# Patient Record
Sex: Female | Born: 1950
Health system: Southern US, Community
[De-identification: ages and names within clinical notes are randomized; demographics above are authoritative.]

## PROBLEM LIST (undated history)

## (undated) DIAGNOSIS — Z8669 Personal history of other diseases of the nervous system and sense organs: Secondary | ICD-10-CM

## (undated) DIAGNOSIS — E7800 Pure hypercholesterolemia, unspecified: Secondary | ICD-10-CM

## (undated) DIAGNOSIS — I10 Essential (primary) hypertension: Secondary | ICD-10-CM

## (undated) DIAGNOSIS — G4733 Obstructive sleep apnea (adult) (pediatric): Secondary | ICD-10-CM

## (undated) DIAGNOSIS — E039 Hypothyroidism, unspecified: Secondary | ICD-10-CM

## (undated) DIAGNOSIS — C37 Malignant neoplasm of thymus: Secondary | ICD-10-CM

## (undated) DIAGNOSIS — K589 Irritable bowel syndrome without diarrhea: Secondary | ICD-10-CM

## (undated) DIAGNOSIS — G7000 Myasthenia gravis without (acute) exacerbation: Secondary | ICD-10-CM

## (undated) DIAGNOSIS — K219 Gastro-esophageal reflux disease without esophagitis: Secondary | ICD-10-CM

## (undated) DIAGNOSIS — Z9989 Dependence on other enabling machines and devices: Principal | ICD-10-CM

## (undated) DIAGNOSIS — T8859XA Other complications of anesthesia, initial encounter: Secondary | ICD-10-CM

## (undated) HISTORY — DX: Dependence on other enabling machines and devices: Z99.89

## (undated) HISTORY — PX: TONSILLECTOMY: SUR1361

## (undated) HISTORY — DX: Obstructive sleep apnea (adult) (pediatric): G47.33

## (undated) HISTORY — DX: Personal history of other diseases of the nervous system and sense organs: Z86.69

## (undated) HISTORY — DX: Irritable bowel syndrome, unspecified: K58.9

## (undated) HISTORY — DX: Gastro-esophageal reflux disease without esophagitis: K21.9

## (undated) HISTORY — DX: Myasthenia gravis without (acute) exacerbation: G70.00

## (undated) HISTORY — DX: Essential (primary) hypertension: I10

## (undated) HISTORY — PX: ABDOMINAL HYSTERECTOMY: SHX81

## (undated) HISTORY — DX: Pure hypercholesterolemia, unspecified: E78.00

---

## 1997-02-17 HISTORY — PX: APPENDECTOMY: SHX54

## 2004-01-03 ENCOUNTER — Ambulatory Visit: Payer: Self-pay | Admitting: Internal Medicine

## 2005-02-11 ENCOUNTER — Ambulatory Visit: Payer: Self-pay | Admitting: Internal Medicine

## 2006-02-24 ENCOUNTER — Ambulatory Visit: Payer: Self-pay | Admitting: Internal Medicine

## 2007-05-06 ENCOUNTER — Ambulatory Visit: Payer: Self-pay | Admitting: Internal Medicine

## 2007-09-07 ENCOUNTER — Ambulatory Visit: Payer: Self-pay | Admitting: Gastroenterology

## 2007-11-12 ENCOUNTER — Ambulatory Visit: Payer: Self-pay | Admitting: Gastroenterology

## 2009-02-17 DIAGNOSIS — G7000 Myasthenia gravis without (acute) exacerbation: Secondary | ICD-10-CM

## 2009-02-17 HISTORY — DX: Myasthenia gravis without (acute) exacerbation: G70.00

## 2010-01-04 ENCOUNTER — Emergency Department: Payer: Self-pay | Admitting: Emergency Medicine

## 2010-01-24 ENCOUNTER — Ambulatory Visit: Payer: Self-pay | Admitting: Internal Medicine

## 2010-01-31 ENCOUNTER — Inpatient Hospital Stay: Payer: Self-pay | Admitting: Internal Medicine

## 2010-03-08 ENCOUNTER — Observation Stay: Payer: Self-pay | Admitting: Neurology

## 2010-03-15 ENCOUNTER — Observation Stay: Payer: Self-pay | Admitting: Neurology

## 2010-03-22 ENCOUNTER — Observation Stay: Payer: Self-pay | Admitting: Neurology

## 2010-03-29 ENCOUNTER — Observation Stay: Payer: Self-pay | Admitting: Neurology

## 2010-04-05 ENCOUNTER — Observation Stay: Payer: Self-pay | Admitting: Neurology

## 2010-04-19 ENCOUNTER — Observation Stay: Payer: Self-pay | Admitting: Internal Medicine

## 2010-11-28 ENCOUNTER — Emergency Department: Payer: Self-pay | Admitting: *Deleted

## 2011-02-07 ENCOUNTER — Ambulatory Visit: Payer: Self-pay | Admitting: Internal Medicine

## 2011-02-18 HISTORY — PX: CHOLECYSTECTOMY: SHX55

## 2011-03-18 DIAGNOSIS — R1013 Epigastric pain: Secondary | ICD-10-CM | POA: Diagnosis not present

## 2011-03-25 DIAGNOSIS — R350 Frequency of micturition: Secondary | ICD-10-CM | POA: Diagnosis not present

## 2011-03-25 DIAGNOSIS — R1013 Epigastric pain: Secondary | ICD-10-CM | POA: Diagnosis not present

## 2011-03-28 DIAGNOSIS — G7000 Myasthenia gravis without (acute) exacerbation: Secondary | ICD-10-CM | POA: Diagnosis not present

## 2011-03-31 DIAGNOSIS — I10 Essential (primary) hypertension: Secondary | ICD-10-CM | POA: Diagnosis not present

## 2011-04-14 DIAGNOSIS — K8010 Calculus of gallbladder with chronic cholecystitis without obstruction: Secondary | ICD-10-CM | POA: Diagnosis not present

## 2011-04-24 DIAGNOSIS — F848 Other pervasive developmental disorders: Secondary | ICD-10-CM | POA: Diagnosis not present

## 2011-04-24 DIAGNOSIS — F39 Unspecified mood [affective] disorder: Secondary | ICD-10-CM | POA: Diagnosis not present

## 2011-04-25 ENCOUNTER — Ambulatory Visit: Payer: Self-pay | Admitting: Surgery

## 2011-04-25 DIAGNOSIS — I10 Essential (primary) hypertension: Secondary | ICD-10-CM | POA: Diagnosis not present

## 2011-04-25 DIAGNOSIS — Z79899 Other long term (current) drug therapy: Secondary | ICD-10-CM | POA: Diagnosis not present

## 2011-04-25 DIAGNOSIS — Z01812 Encounter for preprocedural laboratory examination: Secondary | ICD-10-CM | POA: Diagnosis not present

## 2011-04-25 DIAGNOSIS — E7800 Pure hypercholesterolemia, unspecified: Secondary | ICD-10-CM | POA: Diagnosis not present

## 2011-04-25 DIAGNOSIS — K8010 Calculus of gallbladder with chronic cholecystitis without obstruction: Secondary | ICD-10-CM | POA: Diagnosis not present

## 2011-04-25 LAB — COMPREHENSIVE METABOLIC PANEL
Albumin: 3.4 g/dL (ref 3.4–5.0)
Anion Gap: 12 (ref 7–16)
BUN: 13 mg/dL (ref 7–18)
Bilirubin,Total: 0.4 mg/dL (ref 0.2–1.0)
Chloride: 107 mmol/L (ref 98–107)
Co2: 27 mmol/L (ref 21–32)
Creatinine: 1.11 mg/dL (ref 0.60–1.30)
EGFR (African American): >60
EGFR (Non-African Amer.): 53 — ABNORMAL LOW
Glucose: 110 mg/dL — ABNORMAL HIGH (ref 65–99)
Osmolality: 291 (ref 275–301)
Potassium: 3.1 mmol/L — ABNORMAL LOW (ref 3.5–5.1)
SGOT(AST): 18 U/L (ref 15–37)
SGPT (ALT): 25 U/L

## 2011-04-25 LAB — CBC WITH DIFFERENTIAL/PLATELET
Basophil #: 0.1 10*3/uL (ref 0.0–0.1)
Eosinophil #: 0.1 10*3/uL (ref 0.0–0.7)
Eosinophil %: 0.8 %
HCT: 39.9 % (ref 35.0–47.0)
Lymphocyte %: 20.4 %
MCH: 31.6 pg (ref 26.0–34.0)
MCHC: 32.5 g/dL (ref 32.0–36.0)
MCV: 98 fL (ref 80–100)
Monocyte #: 0.4 10*3/uL (ref 0.0–0.7)
RDW: 15.1 % — ABNORMAL HIGH (ref 11.5–14.5)

## 2011-04-25 LAB — URINALYSIS, COMPLETE
Blood: NEGATIVE
Nitrite: NEGATIVE
Ph: 6 (ref 4.5–8.0)
Protein: NEGATIVE
Specific Gravity: 1.019 (ref 1.003–1.030)
WBC UR: 193 /HPF (ref 0–5)

## 2011-04-27 LAB — URINE CULTURE

## 2011-05-02 ENCOUNTER — Ambulatory Visit: Payer: Self-pay | Admitting: Surgery

## 2011-05-02 DIAGNOSIS — R609 Edema, unspecified: Secondary | ICD-10-CM | POA: Diagnosis not present

## 2011-05-02 DIAGNOSIS — K8020 Calculus of gallbladder without cholecystitis without obstruction: Secondary | ICD-10-CM | POA: Diagnosis not present

## 2011-05-02 DIAGNOSIS — K8010 Calculus of gallbladder with chronic cholecystitis without obstruction: Secondary | ICD-10-CM | POA: Diagnosis not present

## 2011-05-02 DIAGNOSIS — K819 Cholecystitis, unspecified: Secondary | ICD-10-CM | POA: Diagnosis not present

## 2011-05-02 DIAGNOSIS — K219 Gastro-esophageal reflux disease without esophagitis: Secondary | ICD-10-CM | POA: Diagnosis not present

## 2011-05-02 DIAGNOSIS — Z79899 Other long term (current) drug therapy: Secondary | ICD-10-CM | POA: Diagnosis not present

## 2011-05-02 DIAGNOSIS — Z800 Family history of malignant neoplasm of digestive organs: Secondary | ICD-10-CM | POA: Diagnosis not present

## 2011-05-02 DIAGNOSIS — K589 Irritable bowel syndrome without diarrhea: Secondary | ICD-10-CM | POA: Diagnosis not present

## 2011-05-02 DIAGNOSIS — E7800 Pure hypercholesterolemia, unspecified: Secondary | ICD-10-CM | POA: Diagnosis not present

## 2011-05-02 DIAGNOSIS — G7000 Myasthenia gravis without (acute) exacerbation: Secondary | ICD-10-CM | POA: Diagnosis not present

## 2011-05-02 DIAGNOSIS — Z8249 Family history of ischemic heart disease and other diseases of the circulatory system: Secondary | ICD-10-CM | POA: Diagnosis not present

## 2011-05-02 DIAGNOSIS — Z833 Family history of diabetes mellitus: Secondary | ICD-10-CM | POA: Diagnosis not present

## 2011-05-02 DIAGNOSIS — I10 Essential (primary) hypertension: Secondary | ICD-10-CM | POA: Diagnosis not present

## 2011-05-05 LAB — PATHOLOGY REPORT

## 2011-07-21 DIAGNOSIS — F848 Other pervasive developmental disorders: Secondary | ICD-10-CM | POA: Diagnosis not present

## 2011-07-21 DIAGNOSIS — F39 Unspecified mood [affective] disorder: Secondary | ICD-10-CM | POA: Diagnosis not present

## 2011-10-10 DIAGNOSIS — G7000 Myasthenia gravis without (acute) exacerbation: Secondary | ICD-10-CM | POA: Diagnosis not present

## 2011-10-21 DIAGNOSIS — F39 Unspecified mood [affective] disorder: Secondary | ICD-10-CM | POA: Diagnosis not present

## 2011-12-05 ENCOUNTER — Encounter: Payer: Self-pay | Admitting: Internal Medicine

## 2011-12-05 ENCOUNTER — Ambulatory Visit (INDEPENDENT_AMBULATORY_CARE_PROVIDER_SITE_OTHER): Payer: Medicare Other | Admitting: Internal Medicine

## 2011-12-05 VITALS — BP 138/86 | HR 82 | Temp 99.0°F | Ht 66.0 in | Wt 219.0 lb

## 2011-12-05 DIAGNOSIS — E7800 Pure hypercholesterolemia, unspecified: Secondary | ICD-10-CM

## 2011-12-05 DIAGNOSIS — G40909 Epilepsy, unspecified, not intractable, without status epilepticus: Secondary | ICD-10-CM | POA: Diagnosis not present

## 2011-12-05 DIAGNOSIS — G7000 Myasthenia gravis without (acute) exacerbation: Secondary | ICD-10-CM

## 2011-12-05 DIAGNOSIS — I10 Essential (primary) hypertension: Secondary | ICD-10-CM | POA: Diagnosis not present

## 2011-12-05 DIAGNOSIS — K589 Irritable bowel syndrome without diarrhea: Secondary | ICD-10-CM

## 2011-12-05 NOTE — Patient Instructions (Addendum)
It was nice seeing you today.  I am glad you have been doing well.  We will schedule a follow up appt.

## 2011-12-21 ENCOUNTER — Encounter: Payer: Self-pay | Admitting: Internal Medicine

## 2011-12-21 DIAGNOSIS — E7800 Pure hypercholesterolemia, unspecified: Secondary | ICD-10-CM | POA: Insufficient documentation

## 2011-12-21 DIAGNOSIS — G7000 Myasthenia gravis without (acute) exacerbation: Secondary | ICD-10-CM | POA: Insufficient documentation

## 2011-12-21 DIAGNOSIS — G40909 Epilepsy, unspecified, not intractable, without status epilepticus: Secondary | ICD-10-CM | POA: Insufficient documentation

## 2011-12-21 DIAGNOSIS — K589 Irritable bowel syndrome without diarrhea: Secondary | ICD-10-CM | POA: Insufficient documentation

## 2011-12-21 DIAGNOSIS — I10 Essential (primary) hypertension: Secondary | ICD-10-CM | POA: Insufficient documentation

## 2011-12-21 NOTE — Progress Notes (Signed)
  Subjective:    Patient ID: Madison Burns, female    DOB: [birth year], 61 y.o.   MRN: [id number]  HPI 61 year old female with past history of myasthenia gravis, hypertension, hypercholesterolemia and seizure disorder who comes in today for a scheduled follow up.  She is accompanied by her sister (her caretaker).  History obtained from both of them.  Sister states Kriste Basque has been doing well.  Weight has stabilized.  Continuing to taper down her Prednisone.  Sees Dr Chestine Spore for her myasthenia gravis.  He is controlling the taper.  Sleeping well.  Sees Dr Janeece Riggers.  Risperdol helps her sleep.  No abdominal pain or cramping.  Eating and drinking well.  No bowel complaints.    Past Medical History  Diagnosis Date  . GERD (gastroesophageal reflux disease)   . Hypertension   . Hypercholesterolemia   . History of seizure disorder   . Myasthenia gravis   . Irritable bowel syndrome     Review of Systems Patient denies any headache, lightheadedness or dizziness.  No sinus or allergy symptoms.   No chest pain, tightness or palpitations.  No increased shortness of breath, cough or congestion.  No nausea or vomiting.  No abdominal pain or cramping.  No bowel change, such as diarrhea, constipation, BRBPR or melana.  No urine change.  Overall sister thinks she is doing well.      Objective:   Physical Exam Filed Vitals:   12/05/11 1543  BP: 138/86  Pulse: 82  Temp: 99 F (40.4 C)   60 year old female in no acute distress.   HEENT:  Nares - clear.  OP- without lesions or erythema.  NECK:  Supple, nontender.  No audible bruit.   HEART:  Appears to be regular. LUNGS:  Without crackles or wheezing audible.  Respirations even and unlabored.   RADIAL PULSE:  Equal bilaterally.  ABDOMEN:  Soft, nontender.  No audible abdominal bruit.   EXTREMITIES:  No increased edema to be present.  Stable.                   Assessment & Plan:  PSYCH.  Seeing Dr Janeece Riggers.  Doing well on her current regimen.   HYPERGLYCEMIA.   Blood sugars elevated with the long term prednisone therapy.  Sister reports with last labs, Dr Chestine Spore stated sugar ok.  Will obtain labs for review before drawing more labs here.  (sister request).    HEALTH MAINTENANCE.  Is s/p hysterectomy.  Obtain records for review.  Keep her on track with her mammograms and pelvic exams.  Follow cholesterol.  Sees GI.

## 2011-12-21 NOTE — Assessment & Plan Note (Signed)
Stable.  Continues to follow up with GI.   

## 2011-12-21 NOTE — Assessment & Plan Note (Signed)
On Keppra.  Doing well.  Continues to follow up with Dr Chestine Spore.

## 2011-12-21 NOTE — Assessment & Plan Note (Signed)
Sees Dr Chestine Spore.  Tapering prednisone.  Doing well.

## 2011-12-21 NOTE — Assessment & Plan Note (Signed)
On no cholesterol medication now.  Low cholesterol diet and exercise.  Review outside lab results before rechecking here.  Will need to follow lipid profile.

## 2011-12-21 NOTE — Assessment & Plan Note (Signed)
Has been on no medication.  Blood pressure had been doing better.  Follow.  Have them spot check readings.

## 2011-12-25 ENCOUNTER — Ambulatory Visit: Payer: Medicare Other

## 2012-01-14 ENCOUNTER — Other Ambulatory Visit: Payer: Self-pay | Admitting: Internal Medicine

## 2012-01-14 MED ORDER — TOLTERODINE TARTRATE ER 4 MG PO CP24: 4.0000 mg | ORAL_CAPSULE | Freq: Every morning | ORAL | Status: DC

## 2012-01-14 NOTE — Telephone Encounter (Signed)
Refill request for detrol la 4 mg cer #30 Sig: take 1 capsule every day need appt. Per MD

## 2012-01-14 NOTE — Telephone Encounter (Signed)
Refilled script for detrol la

## 2012-02-03 ENCOUNTER — Telehealth: Payer: Self-pay | Admitting: Internal Medicine

## 2012-02-03 ENCOUNTER — Other Ambulatory Visit: Payer: Self-pay | Admitting: *Deleted

## 2012-02-03 DIAGNOSIS — K589 Irritable bowel syndrome without diarrhea: Secondary | ICD-10-CM

## 2012-02-03 MED ORDER — PANTOPRAZOLE SODIUM 40 MG PO TBEC: 40.0000 mg | DELAYED_RELEASE_TABLET | Freq: Every morning | ORAL | Status: DC

## 2012-02-03 NOTE — Telephone Encounter (Signed)
Refilled script

## 2012-02-03 NOTE — Telephone Encounter (Signed)
Drug Name- Pantoprazole Sodium 40 mg   Directions- take 1 tablet by mouth daily   Quantity- 90

## 2012-02-21 ENCOUNTER — Emergency Department: Payer: Self-pay | Admitting: Emergency Medicine

## 2012-02-21 DIAGNOSIS — R51 Headache: Secondary | ICD-10-CM | POA: Diagnosis not present

## 2012-02-21 DIAGNOSIS — Z9889 Other specified postprocedural states: Secondary | ICD-10-CM | POA: Diagnosis not present

## 2012-02-21 DIAGNOSIS — Z79899 Other long term (current) drug therapy: Secondary | ICD-10-CM | POA: Diagnosis not present

## 2012-02-21 DIAGNOSIS — G7000 Myasthenia gravis without (acute) exacerbation: Secondary | ICD-10-CM | POA: Diagnosis not present

## 2012-02-21 DIAGNOSIS — I10 Essential (primary) hypertension: Secondary | ICD-10-CM | POA: Diagnosis not present

## 2012-02-21 LAB — BASIC METABOLIC PANEL
Calcium, Total: 9 mg/dL (ref 8.5–10.1)
Co2: 25 mmol/L (ref 21–32)
Creatinine: 1.14 mg/dL (ref 0.60–1.30)
EGFR (African American): >60
Osmolality: 275 (ref 275–301)
Potassium: 4.3 mmol/L (ref 3.5–5.1)

## 2012-02-21 LAB — CBC
HCT: 43.4 % (ref 35.0–47.0)
HGB: 14.6 g/dL (ref 12.0–16.0)
MCH: 32.7 pg (ref 26.0–34.0)
Platelet: 249 10*3/uL (ref 150–440)
RBC: 4.47 10*6/uL (ref 3.80–5.20)
RDW: 15.6 % — ABNORMAL HIGH (ref 11.5–14.5)
WBC: 15.3 10*3/uL — ABNORMAL HIGH (ref 3.6–11.0)

## 2012-02-23 DIAGNOSIS — B0232 Zoster iridocyclitis: Secondary | ICD-10-CM | POA: Diagnosis not present

## 2012-02-27 ENCOUNTER — Encounter: Payer: Self-pay | Admitting: Internal Medicine

## 2012-02-27 ENCOUNTER — Ambulatory Visit (INDEPENDENT_AMBULATORY_CARE_PROVIDER_SITE_OTHER): Payer: Medicare Other | Admitting: Internal Medicine

## 2012-02-27 VITALS — BP 108/58 | HR 108 | Temp 98.1°F | Ht 66.0 in | Wt 214.0 lb

## 2012-02-27 DIAGNOSIS — I10 Essential (primary) hypertension: Secondary | ICD-10-CM | POA: Diagnosis not present

## 2012-02-27 DIAGNOSIS — B0232 Zoster iridocyclitis: Secondary | ICD-10-CM | POA: Diagnosis not present

## 2012-02-27 MED ORDER — HYDROCODONE-ACETAMINOPHEN 5-500 MG PO TABS: 2.0000 | ORAL_TABLET | Freq: Four times a day (QID) | ORAL | Status: DC | PRN

## 2012-02-29 ENCOUNTER — Encounter: Payer: Self-pay | Admitting: Internal Medicine

## 2012-02-29 MED ORDER — PREGABALIN 50 MG PO CAPS: 50.0000 mg | ORAL_CAPSULE | Freq: Two times a day (BID) | ORAL | Status: DC

## 2012-02-29 NOTE — Assessment & Plan Note (Signed)
Blood pressure under good control.  Follow.   

## 2012-02-29 NOTE — Progress Notes (Signed)
  Subjective:    Patient ID: Madison Burns, female    DOB: 1950/09/18, 62 y.o.   MRN: [id number]  HPI 62 year old female with past history of hypercholesterolemia, myasthenia gravis, hypertension and seizure disorder who comes in today as a work in with concerns regarding shingles.  She is accompanied by her sister (her caretaker) and history obtained for both of them.  Her sister reports that she developed a headache approximately one week ago.  Went to ER a few days ago.  Had extensive w/up including scan, etc.  Had one lesion on her forehead at that time.  The next morning - noticed increased rash.  Called the ER back and was started on Valtrex and prednisone taper.  Was also given hydrocodone for pain.  She has been taking 2 tablets qid.  Saw opthalmology a few days ago and today.  On prednisolone eye drops.  No fever.  Some decreased appetite, but is eating.  No vomiting.    Past Medical History  Diagnosis Date  . GERD (gastroesophageal reflux disease)   . Hypertension   . Hypercholesterolemia   . History of seizure disorder   . Myasthenia gravis   . Irritable bowel syndrome     Review of Systems Patient denies any lightheadedness or dizziness.  Does report the headache as outlined.  Increased pain from shingles.  The hydrocodone does take some of the edge off.  No chest pain, tightness or palpitations.  No increased shortness of breath, cough or congestion.  No nausea or vomiting.  No abdominal pain or cramping.  No bowel change, such as diarrhea, BRBPR or melana.  No urine change.  Bowels have slowed some with the narcotic pain med.  Pts sister feels she may need something more for pain.        Objective:   Physical Exam Filed Vitals:   02/27/12 1154  BP: 108/58  Pulse: 108  Temp: 98.1 F (66.93 C)   63 year old female in no acute distress.   HEENT:  Nares - clear.  OP- without lesions or erythema.  Face - rash that extends from the nose/right cheek, eye and up into her scalp -  right.   NECK:  Supple, nontender.     HEART:  Appears to be regular. LUNGS:  Without crackles or wheezing audible.  Respirations even and unlabored.   RADIAL PULSE:  Equal bilaterally.  ABDOMEN:  Soft, nontender.  Bowels sounds present and normal.      Assessment & Plan:  HERPES ZOSTER.  Already seeing opthalmology and being treated with prednisolone eye drops.  On vicodin.  Refilled today.  Discussed with neurology - given her other neurological issues.  Will start her on Lyrica 50mg  q day initially and then increase to 50mg  bid.  Follow.  Discussed possible side effects of the medication.  Follow.

## 2012-03-05 DIAGNOSIS — B0232 Zoster iridocyclitis: Secondary | ICD-10-CM | POA: Diagnosis not present

## 2012-03-08 ENCOUNTER — Telehealth: Payer: Self-pay | Admitting: Internal Medicine

## 2012-03-08 ENCOUNTER — Other Ambulatory Visit: Payer: Self-pay | Admitting: Internal Medicine

## 2012-03-08 NOTE — Telephone Encounter (Signed)
Hydrocodone/APAP 5-325 mg tab   # 40

## 2012-03-08 NOTE — Telephone Encounter (Signed)
Patient notified

## 2012-03-08 NOTE — Telephone Encounter (Signed)
Pt sister called .  Harriett Sine stated she thought Madison Burns need to go up on her lycia she is taking 50mg .  Harriett Sine stated she thought she needs to go up to 2 pills.  Pt has been on this 1 week today.  And Kriste Basque is still in pain.  Does she need to take this once daily or twice daily

## 2012-03-08 NOTE — Telephone Encounter (Signed)
If she is taking and tolerating and not having any problems - can try to take 50mg  bid.  Let us know if problems.

## 2012-03-08 NOTE — Telephone Encounter (Signed)
Already sent in to pharmacy. 

## 2012-03-11 ENCOUNTER — Other Ambulatory Visit: Payer: Self-pay | Admitting: Internal Medicine

## 2012-03-11 NOTE — Telephone Encounter (Signed)
Pt has shingles and she is out of her Hydrocodone and Lyrica. She uses medicap

## 2012-03-11 NOTE — Telephone Encounter (Signed)
Ok to refill hydrocodone x 1.  Will need to call in.  Also the lyrica is now bid instead of q day.  Will need to call in #60 with one refill.

## 2012-03-12 MED ORDER — HYDROCODONE-ACETAMINOPHEN 5-325 MG PO TABS: 2.0000 | ORAL_TABLET | Freq: Four times a day (QID) | ORAL | Status: DC | PRN

## 2012-03-12 NOTE — Telephone Encounter (Signed)
Called prescription in to pharmacy. The pharmacist said that 5-500 mg is no longer available. Changed to 5-325 mg.

## 2012-03-15 NOTE — Telephone Encounter (Signed)
Refills canceled on hydrocodone per Dr. Lorin Picket.

## 2012-03-19 DIAGNOSIS — B0232 Zoster iridocyclitis: Secondary | ICD-10-CM | POA: Diagnosis not present

## 2012-03-29 ENCOUNTER — Other Ambulatory Visit: Payer: Self-pay | Admitting: Internal Medicine

## 2012-03-29 NOTE — Telephone Encounter (Signed)
Refilled hydrocodone #60 with no refills.  Called in to [facility]

## 2012-04-06 ENCOUNTER — Telehealth: Payer: Self-pay | Admitting: *Deleted

## 2012-04-06 NOTE — Telephone Encounter (Signed)
Called [phone] for prior authorization on the Pantoprazole sodium 40 mg, form is being faxed over now

## 2012-04-13 DIAGNOSIS — I10 Essential (primary) hypertension: Secondary | ICD-10-CM | POA: Diagnosis not present

## 2012-04-15 ENCOUNTER — Telehealth: Payer: Self-pay | Admitting: *Deleted

## 2012-04-15 NOTE — Telephone Encounter (Signed)
Called about patient's insurance not covering protonix. Left message for patient's sister to return call.

## 2012-04-22 DIAGNOSIS — E669 Obesity, unspecified: Secondary | ICD-10-CM | POA: Diagnosis not present

## 2012-04-22 DIAGNOSIS — G7000 Myasthenia gravis without (acute) exacerbation: Secondary | ICD-10-CM | POA: Diagnosis not present

## 2012-04-22 DIAGNOSIS — R569 Unspecified convulsions: Secondary | ICD-10-CM | POA: Diagnosis not present

## 2012-05-06 DIAGNOSIS — B0232 Zoster iridocyclitis: Secondary | ICD-10-CM | POA: Diagnosis not present

## 2012-05-29 DIAGNOSIS — R569 Unspecified convulsions: Secondary | ICD-10-CM | POA: Diagnosis not present

## 2012-06-02 NOTE — Telephone Encounter (Signed)
Please close encounter

## 2012-06-07 ENCOUNTER — Encounter: Payer: Medicare Other | Admitting: Internal Medicine

## 2012-06-11 ENCOUNTER — Other Ambulatory Visit: Payer: Self-pay | Admitting: Internal Medicine

## 2012-06-14 NOTE — Telephone Encounter (Signed)
Rx sent to pharmacy by escript  

## 2012-07-15 ENCOUNTER — Encounter: Payer: Self-pay | Admitting: Internal Medicine

## 2012-07-15 ENCOUNTER — Ambulatory Visit (INDEPENDENT_AMBULATORY_CARE_PROVIDER_SITE_OTHER): Payer: Medicare Other | Admitting: Internal Medicine

## 2012-07-15 VITALS — BP 120/78 | HR 93 | Temp 97.9°F | Ht 64.5 in | Wt 216.8 lb

## 2012-07-15 DIAGNOSIS — R739 Hyperglycemia, unspecified: Secondary | ICD-10-CM

## 2012-07-15 DIAGNOSIS — I10 Essential (primary) hypertension: Secondary | ICD-10-CM

## 2012-07-15 DIAGNOSIS — R7309 Other abnormal glucose: Secondary | ICD-10-CM

## 2012-07-15 DIAGNOSIS — E7800 Pure hypercholesterolemia, unspecified: Secondary | ICD-10-CM | POA: Diagnosis not present

## 2012-07-15 DIAGNOSIS — K589 Irritable bowel syndrome without diarrhea: Secondary | ICD-10-CM | POA: Diagnosis not present

## 2012-07-15 DIAGNOSIS — G7000 Myasthenia gravis without (acute) exacerbation: Secondary | ICD-10-CM | POA: Diagnosis not present

## 2012-07-15 DIAGNOSIS — G40909 Epilepsy, unspecified, not intractable, without status epilepticus: Secondary | ICD-10-CM

## 2012-07-15 DIAGNOSIS — R5383 Other fatigue: Secondary | ICD-10-CM

## 2012-07-15 DIAGNOSIS — Z1239 Encounter for other screening for malignant neoplasm of breast: Secondary | ICD-10-CM

## 2012-07-16 ENCOUNTER — Other Ambulatory Visit: Payer: Self-pay | Admitting: Internal Medicine

## 2012-07-18 ENCOUNTER — Encounter: Payer: Self-pay | Admitting: Internal Medicine

## 2012-07-18 NOTE — Assessment & Plan Note (Signed)
Stable.  Continues to follow up with GI.   

## 2012-07-18 NOTE — Assessment & Plan Note (Signed)
Blood pressure under good control.  Follow.   

## 2012-07-18 NOTE — Assessment & Plan Note (Signed)
On no cholesterol medication now.  Low cholesterol diet and exercise.  Check lipid panel.

## 2012-07-18 NOTE — Assessment & Plan Note (Signed)
On Keppra.  Doing well.  Continues to follow up with Dr Potter.    

## 2012-07-18 NOTE — Progress Notes (Signed)
Subjective:    Patient ID: Madison Burns, female    DOB: February 27, 1950, 62 y.o.   MRN: [id number]  HPI 62 year old female with past history of myasthenia gravis, hypertension, hypercholesterolemia and seizure disorder who comes in today to follow up on these issues as well as for a complete physical exam.   She is accompanied by her sister (her caretaker).  History obtained from both of them.  Sister states Kriste Basque has been doing well.  Continuing to taper down her Prednisone.  Seeing Dr Malvin Johns for her myasthenia gravis.  He is controlling the taper.  Sleeping well.  Sees Dr Janeece Riggers.  Eating and drinking well.  No bowel complaints.     Past Medical History  Diagnosis Date  . GERD (gastroesophageal reflux disease)   . Hypertension   . Hypercholesterolemia   . History of seizure disorder   . Myasthenia gravis   . Irritable bowel syndrome     Current Outpatient Prescriptions on File Prior to Visit  Medication Sig Dispense Refill  . HYDROcodone-acetaminophen (NORCO/VICODIN) 5-325 MG per tablet TAKE TWO TABLETS BY MOUTH EVERY 6 HOURS AS NEEDED  60 tablet  0  . levETIRAcetam (KEPPRA) 500 MG tablet Take 500 mg by mouth 2 (two) times daily.      . methotrexate (RHEUMATREX) 15 MG tablet 8 tablets once weekly (on Saturday) Caution: Chemotherapy. Protect from light.      . methylPREDNISolone (MEDROL DOSEPAK) 4 MG tablet Take by mouth. follow package directions      . prednisoLONE acetate (PRED FORTE) 1 % ophthalmic suspension 1 drop 4 (four) times daily.      . predniSONE (DELTASONE) 20 MG tablet Take 1 and 1/2 tablet by mouth every other day      . pregabalin (LYRICA) 50 MG capsule Take 1 capsule (50 mg total) by mouth 2 (two) times daily.  60 capsule  1  . pyridostigmine (MESTINON) 60 MG tablet Take 60 mg by mouth 3 (three) times daily.      . risperiDONE (RISPERDAL) 1 MG tablet Take 1 mg by mouth at bedtime.      . tolterodine (DETROL LA) 4 MG 24 hr capsule Take 1 capsule (4 mg total) by mouth every  morning.  90 capsule  3  . valACYclovir (VALTREX) 1000 MG tablet Take 1,000 mg by mouth 2 (two) times daily.       No current facility-administered medications on file prior to visit.    Review of Systems Patient denies any headache, lightheadedness or dizziness.  No sinus or allergy symptoms.   No chest pain, tightness or palpitations.  No increased shortness of breath, cough or congestion.  No nausea or vomiting.  Has not had significant abdominal pain or cramping, but does report some minimal abdominal pain today.   No bowel change, such as diarrhea, constipation, BRBPR or melana. No urine change.  Overall sister thinks she is doing well.      Objective:   Physical Exam  Filed Vitals:   07/15/12 1526  BP: 120/78  Pulse: 93  Temp: 97.9 F (73.59 C)   62 year old female in no acute distress.   HEENT:  Nares- clear.  Oropharynx - without lesions. NECK:  Supple.  Nontender.  No audible bruit.  HEART:  Appears to be regular. LUNGS:  No crackles or wheezing audible.  Respirations even and unlabored.  RADIAL PULSE:  Equal bilaterally.    BREASTS:  No nipple discharge or nipple retraction present.  Could not appreciate  any distinct nodules or axillary adenopathy.  ABDOMEN:  Soft, nontender.  Bowel sounds present and normal.  No audible abdominal bruit.  GU:  Not performed today.   EXTREMITIES:  No increased edema present.  DP pulses palpable and equal bilaterally.            Assessment & Plan:  PSYCH.  Seeing Dr Janeece Riggers.  Doing well on her current regimen.   ABDOMINAL PAIN.  Sister states Kriste Basque has not mentioned any pain.  No pain on exam.  Follow.    HYPERGLYCEMIA.  Blood sugars elevated with the long term prednisone therapy.  Sister reports with last labs, Dr Chestine Spore stated sugar ok.   Will schedule fasting glucose and a1c.   HEALTH MAINTENANCE.  Is s/p hysterectomy.  Pelvic not performed today.  Breast exam today.  Schedule mammogram.  Sees GI.

## 2012-07-18 NOTE — Assessment & Plan Note (Signed)
Sees Dr Potter.  Tapering prednisone.  Doing well.   

## 2012-07-23 ENCOUNTER — Telehealth: Payer: Self-pay | Admitting: Internal Medicine

## 2012-07-23 ENCOUNTER — Other Ambulatory Visit (INDEPENDENT_AMBULATORY_CARE_PROVIDER_SITE_OTHER): Payer: Medicare Other

## 2012-07-23 DIAGNOSIS — G7000 Myasthenia gravis without (acute) exacerbation: Secondary | ICD-10-CM

## 2012-07-23 DIAGNOSIS — E7800 Pure hypercholesterolemia, unspecified: Secondary | ICD-10-CM | POA: Diagnosis not present

## 2012-07-23 DIAGNOSIS — R5381 Other malaise: Secondary | ICD-10-CM

## 2012-07-23 DIAGNOSIS — R5383 Other fatigue: Secondary | ICD-10-CM

## 2012-07-23 DIAGNOSIS — I10 Essential (primary) hypertension: Secondary | ICD-10-CM

## 2012-07-23 DIAGNOSIS — R7309 Other abnormal glucose: Secondary | ICD-10-CM | POA: Diagnosis not present

## 2012-07-23 DIAGNOSIS — R739 Hyperglycemia, unspecified: Secondary | ICD-10-CM

## 2012-07-23 LAB — CBC WITH DIFFERENTIAL/PLATELET
Eosinophils Absolute: 0.2 10*3/uL (ref 0.0–0.7)
Eosinophils Relative: 1.4 % (ref 0.0–5.0)
HCT: 42 % (ref 36.0–46.0)
Lymphs Abs: 2.5 10*3/uL (ref 0.7–4.0)
MCHC: 33.6 g/dL (ref 30.0–36.0)
MCV: 99.5 fl (ref 78.0–100.0)
Monocytes Absolute: 0.6 10*3/uL (ref 0.1–1.0)
Neutrophils Relative %: 70.5 % (ref 43.0–77.0)
Platelets: 228 10*3/uL (ref 150.0–400.0)
RDW: 15.1 % — ABNORMAL HIGH (ref 11.5–14.6)

## 2012-07-23 LAB — COMPREHENSIVE METABOLIC PANEL
AST: 18 U/L (ref 0–37)
Albumin: 3.6 g/dL (ref 3.5–5.2)
Alkaline Phosphatase: 45 U/L (ref 39–117)
Glucose, Bld: 78 mg/dL (ref 70–99)
Potassium: 3.3 mEq/L — ABNORMAL LOW (ref 3.5–5.1)
Sodium: 142 mEq/L (ref 135–145)
Total Bilirubin: 0.9 mg/dL (ref 0.3–1.2)
Total Protein: 5.7 g/dL — ABNORMAL LOW (ref 6.0–8.3)

## 2012-07-23 LAB — LIPID PANEL
Cholesterol: 231 mg/dL — ABNORMAL HIGH (ref 0–200)
HDL: 37.5 mg/dL — ABNORMAL LOW (ref >39.00)
Total CHOL/HDL Ratio: 6
VLDL: 55.2 mg/dL — ABNORMAL HIGH (ref 0.0–40.0)

## 2012-07-23 LAB — LDL CHOLESTEROL, DIRECT: Direct LDL: 138.8 mg/dL

## 2012-07-23 LAB — TSH: TSH: 2.08 u[IU]/mL (ref 0.35–5.50)

## 2012-07-23 NOTE — Telephone Encounter (Signed)
Pt sister was in today with Madison Burns and they are still needing pantoprazole  Pt is out of meds medicap

## 2012-07-25 ENCOUNTER — Other Ambulatory Visit: Payer: Self-pay | Admitting: Internal Medicine

## 2012-07-25 DIAGNOSIS — E876 Hypokalemia: Secondary | ICD-10-CM

## 2012-07-25 DIAGNOSIS — D72829 Elevated white blood cell count, unspecified: Secondary | ICD-10-CM

## 2012-07-25 NOTE — Progress Notes (Signed)
Order placed for f/u labs.  

## 2012-07-26 ENCOUNTER — Encounter: Payer: Self-pay | Admitting: *Deleted

## 2012-07-29 ENCOUNTER — Encounter: Payer: Self-pay | Admitting: Emergency Medicine

## 2012-08-02 NOTE — Telephone Encounter (Signed)
Rx was filled on 07/16/12 with 5 addt'l refills

## 2012-08-05 ENCOUNTER — Encounter: Payer: Self-pay | Admitting: Internal Medicine

## 2012-08-12 ENCOUNTER — Other Ambulatory Visit (INDEPENDENT_AMBULATORY_CARE_PROVIDER_SITE_OTHER): Payer: Medicare Other

## 2012-08-12 DIAGNOSIS — E876 Hypokalemia: Secondary | ICD-10-CM

## 2012-08-12 DIAGNOSIS — D72829 Elevated white blood cell count, unspecified: Secondary | ICD-10-CM | POA: Diagnosis not present

## 2012-08-13 ENCOUNTER — Other Ambulatory Visit: Payer: Medicare Other

## 2012-08-13 LAB — CBC WITH DIFFERENTIAL/PLATELET
Basophils Absolute: 0 10*3/uL (ref 0.0–0.1)
Basophils Relative: 0.2 % (ref 0.0–3.0)
HCT: 45 % (ref 36.0–46.0)
Hemoglobin: 15.1 g/dL — ABNORMAL HIGH (ref 12.0–15.0)
Lymphocytes Relative: 3.7 % — ABNORMAL LOW (ref 12.0–46.0)
Lymphs Abs: 0.6 10*3/uL — ABNORMAL LOW (ref 0.7–4.0)
Monocytes Relative: 1.2 % — ABNORMAL LOW (ref 3.0–12.0)
Neutro Abs: 14.2 10*3/uL — ABNORMAL HIGH (ref 1.4–7.7)
RBC: 4.45 Mil/uL (ref 3.87–5.11)
RDW: 15.6 % — ABNORMAL HIGH (ref 11.5–14.6)

## 2012-08-14 ENCOUNTER — Other Ambulatory Visit: Payer: Self-pay | Admitting: Internal Medicine

## 2012-08-14 MED ORDER — OMEPRAZOLE 20 MG PO CPDR: 20.0000 mg | DELAYED_RELEASE_CAPSULE | Freq: Every day | ORAL | Status: DC

## 2012-08-14 NOTE — Progress Notes (Signed)
protonix changed to omeprazpole at sister's request.  Sent in rx for #30 with 3 refills for omeprazole.

## 2012-08-23 DIAGNOSIS — E669 Obesity, unspecified: Secondary | ICD-10-CM | POA: Diagnosis not present

## 2012-08-23 DIAGNOSIS — R55 Syncope and collapse: Secondary | ICD-10-CM | POA: Diagnosis not present

## 2012-08-23 DIAGNOSIS — G7000 Myasthenia gravis without (acute) exacerbation: Secondary | ICD-10-CM | POA: Diagnosis not present

## 2012-08-25 ENCOUNTER — Telehealth: Payer: Self-pay | Admitting: *Deleted

## 2012-08-25 DIAGNOSIS — D72829 Elevated white blood cell count, unspecified: Secondary | ICD-10-CM

## 2012-08-25 NOTE — Telephone Encounter (Signed)
Pt is coming in for labs tomorrow 7.10.2014, is it just for a CBC? If so what dx?

## 2012-08-26 ENCOUNTER — Other Ambulatory Visit (INDEPENDENT_AMBULATORY_CARE_PROVIDER_SITE_OTHER): Payer: Medicare Other

## 2012-08-26 ENCOUNTER — Telehealth: Payer: Self-pay | Admitting: *Deleted

## 2012-08-26 DIAGNOSIS — D72829 Elevated white blood cell count, unspecified: Secondary | ICD-10-CM | POA: Diagnosis not present

## 2012-08-26 NOTE — Telephone Encounter (Signed)
Order placed for cbc with dx (leukocytosis).  This is the only lab needed.  Thanks.

## 2012-08-26 NOTE — Telephone Encounter (Signed)
Pt came in for labs today and pts sister Harriett Sine states that the pt has been having this cough that has been coming from no where, and if Dr. Lorin Picket thinks that she should be seen or not?

## 2012-08-26 NOTE — Telephone Encounter (Signed)
If persistent cough - she does need eval.  See if she can see Raquel tomorrow.

## 2012-08-27 ENCOUNTER — Other Ambulatory Visit: Payer: Medicare Other

## 2012-08-27 LAB — CBC WITH DIFFERENTIAL/PLATELET
Basophils Relative: 1.4 % (ref 0.0–3.0)
Eosinophils Absolute: 0.1 10*3/uL (ref 0.0–0.7)
HCT: 42.7 % (ref 36.0–46.0)
Hemoglobin: 14.4 g/dL (ref 12.0–15.0)
MCHC: 33.7 g/dL (ref 30.0–36.0)
MCV: 101 fl — ABNORMAL HIGH (ref 78.0–100.0)
Monocytes Absolute: 0.5 10*3/uL (ref 0.1–1.0)
Neutro Abs: 8.5 10*3/uL — ABNORMAL HIGH (ref 1.4–7.7)
RBC: 4.23 Mil/uL (ref 3.87–5.11)

## 2012-08-27 NOTE — Telephone Encounter (Signed)
LMTCB & schedule appt with Raquel (today at 4:00 for a 30 min appt)

## 2012-09-13 ENCOUNTER — Telehealth: Payer: Self-pay | Admitting: *Deleted

## 2012-09-13 NOTE — Telephone Encounter (Signed)
Called [phone] for prior authorization on the Detrol LA 4 mg, form is being faxed over

## 2012-09-14 NOTE — Telephone Encounter (Signed)
Received PA request form placed in Dr.Scotts Folder

## 2012-11-24 DIAGNOSIS — D2310 Other benign neoplasm of skin of unspecified eyelid, including canthus: Secondary | ICD-10-CM | POA: Diagnosis not present

## 2012-11-24 DIAGNOSIS — B360 Pityriasis versicolor: Secondary | ICD-10-CM | POA: Diagnosis not present

## 2012-12-13 ENCOUNTER — Ambulatory Visit (INDEPENDENT_AMBULATORY_CARE_PROVIDER_SITE_OTHER): Payer: Medicare Other

## 2012-12-13 DIAGNOSIS — Z23 Encounter for immunization: Secondary | ICD-10-CM

## 2012-12-14 ENCOUNTER — Other Ambulatory Visit: Payer: Self-pay | Admitting: Internal Medicine

## 2012-12-22 DIAGNOSIS — D235 Other benign neoplasm of skin of trunk: Secondary | ICD-10-CM | POA: Diagnosis not present

## 2012-12-22 DIAGNOSIS — C434 Malignant melanoma of scalp and neck: Secondary | ICD-10-CM | POA: Diagnosis not present

## 2012-12-28 DIAGNOSIS — E669 Obesity, unspecified: Secondary | ICD-10-CM | POA: Diagnosis not present

## 2012-12-28 DIAGNOSIS — R569 Unspecified convulsions: Secondary | ICD-10-CM | POA: Diagnosis not present

## 2012-12-28 DIAGNOSIS — G7000 Myasthenia gravis without (acute) exacerbation: Secondary | ICD-10-CM | POA: Diagnosis not present

## 2012-12-31 DIAGNOSIS — C434 Malignant melanoma of scalp and neck: Secondary | ICD-10-CM | POA: Diagnosis not present

## 2013-01-12 ENCOUNTER — Other Ambulatory Visit: Payer: Self-pay | Admitting: Internal Medicine

## 2013-01-21 ENCOUNTER — Ambulatory Visit (INDEPENDENT_AMBULATORY_CARE_PROVIDER_SITE_OTHER): Payer: Medicare Other | Admitting: Internal Medicine

## 2013-01-21 ENCOUNTER — Other Ambulatory Visit (HOSPITAL_COMMUNITY)
Admission: RE | Admit: 2013-01-21 | Discharge: 2013-01-21 | Disposition: A | Discharge status: Home / Self Care | Payer: Medicare Other | Source: Ambulatory Visit | Attending: Internal Medicine | Admitting: Internal Medicine

## 2013-01-21 ENCOUNTER — Encounter: Payer: Self-pay | Admitting: Internal Medicine

## 2013-01-21 VITALS — BP 120/90 | HR 85 | Temp 98.5°F | Ht 64.5 in | Wt 210.2 lb

## 2013-01-21 DIAGNOSIS — C439 Malignant melanoma of skin, unspecified: Secondary | ICD-10-CM

## 2013-01-21 DIAGNOSIS — K589 Irritable bowel syndrome without diarrhea: Secondary | ICD-10-CM

## 2013-01-21 DIAGNOSIS — R7309 Other abnormal glucose: Secondary | ICD-10-CM

## 2013-01-21 DIAGNOSIS — G40909 Epilepsy, unspecified, not intractable, without status epilepticus: Secondary | ICD-10-CM | POA: Diagnosis not present

## 2013-01-21 DIAGNOSIS — G7000 Myasthenia gravis without (acute) exacerbation: Secondary | ICD-10-CM | POA: Diagnosis not present

## 2013-01-21 DIAGNOSIS — E7800 Pure hypercholesterolemia, unspecified: Secondary | ICD-10-CM

## 2013-01-21 DIAGNOSIS — Z124 Encounter for screening for malignant neoplasm of cervix: Secondary | ICD-10-CM | POA: Insufficient documentation

## 2013-01-21 DIAGNOSIS — Z1151 Encounter for screening for human papillomavirus (HPV): Secondary | ICD-10-CM | POA: Insufficient documentation

## 2013-01-21 DIAGNOSIS — R739 Hyperglycemia, unspecified: Secondary | ICD-10-CM

## 2013-01-21 DIAGNOSIS — I10 Essential (primary) hypertension: Secondary | ICD-10-CM

## 2013-01-21 NOTE — Progress Notes (Signed)
Subjective:    Patient ID: Madison Burns, female    DOB: [birth year], 62 y.o.   MRN: [id number]  HPI 62 year old female with past history of myasthenia gravis, hypertension, hypercholesterolemia and seizure disorder who comes in today for a scheduled follow up and for her pelvic exam.   She is accompanied by her sister (her caretaker).  History obtained from both of them.  Sister states Madison Burns has been doing well.  Continuing to taper down her Prednisone.  Seeing Dr Malvin Johns for her myasthenia gravis.  He is controlling the taper.  Sleeping well.  Sees Dr Janeece Riggers.  Eating and drinking well.  No bowel complaints.  She has seen Dr Orson Aloe.  Had melanoma - right neck.  Planning to see Dr Katrinka Blazing for further removal.  Has f/u with Dr Orson Aloe on 02/08/13.  Bowels stable.  No urinary symptoms reported.  Overall feels things are stable.      Past Medical History  Diagnosis Date  . GERD (gastroesophageal reflux disease)   . Hypertension   . Hypercholesterolemia   . History of seizure disorder   . Myasthenia gravis   . Irritable bowel syndrome     Current Outpatient Prescriptions on File Prior to Visit  Medication Sig Dispense Refill  . HYDROcodone-acetaminophen (NORCO/VICODIN) 5-325 MG per tablet TAKE TWO TABLETS BY MOUTH EVERY 6 HOURS AS NEEDED  60 tablet  0  . levETIRAcetam (KEPPRA) 500 MG tablet Take 500 mg by mouth 2 (two) times daily.      . methotrexate (RHEUMATREX) 15 MG tablet 8 tablets once weekly (on Saturday) Caution: Chemotherapy. Protect from light.      [name] omeprazole (PRILOSEC) 20 MG capsule TAKE ONE (1) CAPSULE EACH DAY  30 capsule  5  . pyridostigmine (MESTINON) 60 MG tablet Take 60 mg by mouth 3 (three) times daily.      . risperiDONE (RISPERDAL) 1 MG tablet Take 1 mg by mouth at bedtime.      . tolterodine (DETROL LA) 4 MG 24 hr capsule TAKE ONE CAPSULE BY MOUTH EACH MORNING AS DIRECTED  90 capsule  1   No current facility-administered medications on file prior to visit.    Review of  Systems Patient denies any headache, lightheadedness or dizziness.  No sinus or allergy symptoms.   No chest pain, tightness or palpitations.  No increased shortness of breath, cough or congestion.  No nausea or vomiting.  Has not had significant abdominal pain or cramping, but does report some minimal abdominal pain today.   No bowel change, such as diarrhea, constipation, BRBPR or melana.  No urine change.  Overall sister thinks she is doing well.      Objective:   Physical Exam  Filed Vitals:   01/21/13 1240  BP: 120/90  Pulse: 85  Temp: 98.5 F (36.9 C)   Blood pressure recheck:  64/6  62 year old female in no acute distress.   HEENT:  Nares- clear.  Oropharynx - without lesions. NECK:  Supple.  Nontender.  No audible bruit.  HEART:  Appears to be regular. LUNGS:  No crackles or wheezing audible.  Respirations even and unlabored.  RADIAL PULSE:  Equal bilaterally.  ABDOMEN:  Soft, nontender.  Bowel sounds present and normal.  No audible abdominal bruit.  GU:  Normal external genitalia.  Vaginal vault without lesions.  Cervix identified.  Pap performed. Could not appreciate any adnexal masses or tenderness.   EXTREMITIES:  No increased edema present.  DP pulses palpable and equal  bilaterally.           Assessment & Plan:  PSYCH.  Seeing Dr Janeece Riggers.  Doing well on her current regimen.   ABDOMINAL PAIN.  Sister states Madison Burns may mention some abdominal discomfort at times.  Nothing significant or persistent.   No pain on exam.  Follow.    HYPERGLYCEMIA.  Blood sugars elevated with the long term prednisone therapy.  Sister reports with last labs, Dr Chestine Spore stated sugar ok.   Will schedule follow up fasting glucose and a1c.   HEALTH MAINTENANCE.  Pelvic/pap today.    Breast exam last visit.  Schedule mammogram.  Sees GI.

## 2013-01-21 NOTE — Progress Notes (Signed)
Pre-visit discussion using our clinic review tool. No additional management support is needed unless otherwise documented below in the visit note.  

## 2013-01-23 ENCOUNTER — Encounter: Payer: Self-pay | Admitting: Internal Medicine

## 2013-01-23 DIAGNOSIS — C439 Malignant melanoma of skin, unspecified: Secondary | ICD-10-CM | POA: Insufficient documentation

## 2013-01-23 NOTE — Assessment & Plan Note (Signed)
Seeing Dr Orson Aloe for melanoma.  Planning to see Dr Katrinka Blazing for complete removal.

## 2013-01-23 NOTE — Assessment & Plan Note (Signed)
On Keppra.  Doing well.  Continues to follow up with Dr Potter.    

## 2013-01-23 NOTE — Assessment & Plan Note (Signed)
Stable.  Continues to follow up with GI.   

## 2013-01-23 NOTE — Assessment & Plan Note (Signed)
Blood pressure under good control.  Follow.   

## 2013-01-23 NOTE — Assessment & Plan Note (Signed)
On no cholesterol medication now.  Low cholesterol diet and exercise.  Follow lipid panel.   

## 2013-01-23 NOTE — Assessment & Plan Note (Signed)
Sees Dr Potter.  Tapering prednisone.  Doing well.   

## 2013-01-25 ENCOUNTER — Other Ambulatory Visit: Payer: Self-pay | Admitting: Internal Medicine

## 2013-01-25 ENCOUNTER — Encounter: Payer: Self-pay | Admitting: *Deleted

## 2013-01-25 DIAGNOSIS — Z1239 Encounter for other screening for malignant neoplasm of breast: Secondary | ICD-10-CM

## 2013-01-25 NOTE — Progress Notes (Signed)
Order placed for mammogram.

## 2013-02-01 DIAGNOSIS — L905 Scar conditions and fibrosis of skin: Secondary | ICD-10-CM | POA: Diagnosis not present

## 2013-02-01 DIAGNOSIS — C434 Malignant melanoma of scalp and neck: Secondary | ICD-10-CM | POA: Diagnosis not present

## 2013-02-02 DIAGNOSIS — C434 Malignant melanoma of scalp and neck: Secondary | ICD-10-CM | POA: Diagnosis not present

## 2013-02-04 ENCOUNTER — Other Ambulatory Visit (INDEPENDENT_AMBULATORY_CARE_PROVIDER_SITE_OTHER): Payer: Medicare Other

## 2013-02-04 DIAGNOSIS — R7309 Other abnormal glucose: Secondary | ICD-10-CM | POA: Diagnosis not present

## 2013-02-04 DIAGNOSIS — E7800 Pure hypercholesterolemia, unspecified: Secondary | ICD-10-CM

## 2013-02-04 DIAGNOSIS — R739 Hyperglycemia, unspecified: Secondary | ICD-10-CM

## 2013-02-04 DIAGNOSIS — G7000 Myasthenia gravis without (acute) exacerbation: Secondary | ICD-10-CM | POA: Diagnosis not present

## 2013-02-04 DIAGNOSIS — I10 Essential (primary) hypertension: Secondary | ICD-10-CM | POA: Diagnosis not present

## 2013-02-04 LAB — CBC WITH DIFFERENTIAL/PLATELET
Basophils Absolute: 0 10*3/uL (ref 0.0–0.1)
Eosinophils Absolute: 0.1 10*3/uL (ref 0.0–0.7)
HCT: 42.7 % (ref 36.0–46.0)
Lymphs Abs: 2.6 10*3/uL (ref 0.7–4.0)
MCV: 98.1 fl (ref 78.0–100.0)
Monocytes Absolute: 0.6 10*3/uL (ref 0.1–1.0)
Monocytes Relative: 5.2 % (ref 3.0–12.0)
Neutro Abs: 8.2 10*3/uL — ABNORMAL HIGH (ref 1.4–7.7)
Neutrophils Relative %: 71 % (ref 43.0–77.0)
Platelets: 245 10*3/uL (ref 150.0–400.0)
RDW: 14.7 % — ABNORMAL HIGH (ref 11.5–14.6)

## 2013-02-04 LAB — BASIC METABOLIC PANEL
BUN: 11 mg/dL (ref 6–23)
Creatinine, Ser: 0.9 mg/dL (ref 0.4–1.2)
GFR: 67.51 mL/min (ref >60.00)
Glucose, Bld: 93 mg/dL (ref 70–99)
Potassium: 3.2 mEq/L — ABNORMAL LOW (ref 3.5–5.1)

## 2013-02-04 LAB — LIPID PANEL
Cholesterol: 205 mg/dL — ABNORMAL HIGH (ref 0–200)
VLDL: 51.4 mg/dL — ABNORMAL HIGH (ref 0.0–40.0)

## 2013-02-04 LAB — HEPATIC FUNCTION PANEL
ALT: 18 U/L (ref 0–35)
AST: 19 U/L (ref 0–37)
Albumin: 3.7 g/dL (ref 3.5–5.2)
Alkaline Phosphatase: 59 U/L (ref 39–117)
Bilirubin, Direct: 0.1 mg/dL (ref 0.0–0.3)
Total Protein: 6 g/dL (ref 6.0–8.3)

## 2013-02-08 DIAGNOSIS — C434 Malignant melanoma of scalp and neck: Secondary | ICD-10-CM | POA: Diagnosis not present

## 2013-02-08 DIAGNOSIS — L905 Scar conditions and fibrosis of skin: Secondary | ICD-10-CM | POA: Diagnosis not present

## 2013-02-23 ENCOUNTER — Telehealth: Payer: Self-pay | Admitting: *Deleted

## 2013-02-23 DIAGNOSIS — E876 Hypokalemia: Secondary | ICD-10-CM

## 2013-02-23 NOTE — Telephone Encounter (Signed)
Pt is coming in for labs tomorrow 01.08.2015 what labs and dx?  

## 2013-02-23 NOTE — Telephone Encounter (Signed)
Order placed for potassium lab 

## 2013-02-24 ENCOUNTER — Other Ambulatory Visit (INDEPENDENT_AMBULATORY_CARE_PROVIDER_SITE_OTHER): Payer: Medicare Other

## 2013-02-24 DIAGNOSIS — E876 Hypokalemia: Secondary | ICD-10-CM

## 2013-02-24 LAB — POTASSIUM: POTASSIUM: 3.9 meq/L (ref 3.5–5.1)

## 2013-02-25 ENCOUNTER — Encounter: Payer: Self-pay | Admitting: *Deleted

## 2013-02-28 DIAGNOSIS — C434 Malignant melanoma of scalp and neck: Secondary | ICD-10-CM | POA: Diagnosis not present

## 2013-03-30 DIAGNOSIS — G7000 Myasthenia gravis without (acute) exacerbation: Secondary | ICD-10-CM | POA: Diagnosis not present

## 2013-03-30 DIAGNOSIS — E669 Obesity, unspecified: Secondary | ICD-10-CM | POA: Diagnosis not present

## 2013-03-30 DIAGNOSIS — IMO0002 Reserved for concepts with insufficient information to code with codable children: Secondary | ICD-10-CM | POA: Diagnosis not present

## 2013-03-30 DIAGNOSIS — R569 Unspecified convulsions: Secondary | ICD-10-CM | POA: Diagnosis not present

## 2013-04-20 DIAGNOSIS — R079 Chest pain, unspecified: Secondary | ICD-10-CM | POA: Diagnosis not present

## 2013-05-02 DIAGNOSIS — C434 Malignant melanoma of scalp and neck: Secondary | ICD-10-CM | POA: Diagnosis not present

## 2013-05-02 DIAGNOSIS — D2330 Other benign neoplasm of skin of unspecified part of face: Secondary | ICD-10-CM | POA: Diagnosis not present

## 2013-05-02 DIAGNOSIS — D2310 Other benign neoplasm of skin of unspecified eyelid, including canthus: Secondary | ICD-10-CM | POA: Diagnosis not present

## 2013-05-15 ENCOUNTER — Other Ambulatory Visit: Payer: Self-pay | Admitting: Internal Medicine

## 2013-05-15 DIAGNOSIS — Z1239 Encounter for other screening for malignant neoplasm of breast: Secondary | ICD-10-CM

## 2013-05-15 NOTE — Progress Notes (Signed)
Order placed for mammogram.

## 2013-05-26 ENCOUNTER — Encounter: Payer: Self-pay | Admitting: Internal Medicine

## 2013-05-26 ENCOUNTER — Ambulatory Visit (INDEPENDENT_AMBULATORY_CARE_PROVIDER_SITE_OTHER): Payer: Medicare Other | Admitting: Internal Medicine

## 2013-05-26 VITALS — BP 120/90 | HR 82 | Temp 98.1°F | Wt 214.0 lb

## 2013-05-26 DIAGNOSIS — C439 Malignant melanoma of skin, unspecified: Secondary | ICD-10-CM

## 2013-05-26 DIAGNOSIS — G7000 Myasthenia gravis without (acute) exacerbation: Secondary | ICD-10-CM | POA: Diagnosis not present

## 2013-05-26 DIAGNOSIS — E7800 Pure hypercholesterolemia, unspecified: Secondary | ICD-10-CM

## 2013-05-26 DIAGNOSIS — R7309 Other abnormal glucose: Secondary | ICD-10-CM

## 2013-05-26 DIAGNOSIS — K589 Irritable bowel syndrome without diarrhea: Secondary | ICD-10-CM

## 2013-05-26 DIAGNOSIS — I10 Essential (primary) hypertension: Secondary | ICD-10-CM

## 2013-05-26 DIAGNOSIS — G40909 Epilepsy, unspecified, not intractable, without status epilepticus: Secondary | ICD-10-CM

## 2013-05-26 DIAGNOSIS — R739 Hyperglycemia, unspecified: Secondary | ICD-10-CM

## 2013-05-26 NOTE — Progress Notes (Signed)
Pre visit review using our clinic review tool, if applicable. No additional management support is needed unless otherwise documented below in the visit note. 

## 2013-05-29 ENCOUNTER — Encounter: Payer: Self-pay | Admitting: Internal Medicine

## 2013-05-29 NOTE — Progress Notes (Signed)
Subjective:    Patient ID: Madison Burns, female    DOB: 1950-06-13, 63 y.o.   MRN: [id number]  HPI 63 year old female with past history of myasthenia gravis, hypertension, hypercholesterolemia and seizure disorder who comes in today for a scheduled follow up.  She is accompanied by her sister (her caretaker) and her mother.  History obtained from all of them.   Sister states Madison Burns has been doing well.  Continuing to taper down her Prednisone.  Seeing Dr Melrose Nakayama for her myasthenia gravis.  He is controlling the taper.  Sleeping well.  Sees Dr Kasandra Knudsen.  Eating and drinking well.  No bowel complaints.  She has seen Dr Koleen Nimrod.  Had melanoma - right neck.   Bowels stable.  No urinary symptoms reported.  She is more interactive.  Playing Jeopardy and Wheel of Fortune with her family.  Overall doing well.     Past Medical History  Diagnosis Date  . GERD (gastroesophageal reflux disease)   . Hypertension   . Hypercholesterolemia   . History of seizure disorder   . Myasthenia gravis   . Irritable bowel syndrome     Current Outpatient Prescriptions on File Prior to Visit  Medication Sig Dispense Refill  . HYDROcodone-acetaminophen (NORCO/VICODIN) 5-325 MG per tablet TAKE TWO TABLETS BY MOUTH EVERY 6 HOURS AS NEEDED  60 tablet  0  . levETIRAcetam (KEPPRA) 500 MG tablet Take 500 mg by mouth 2 (two) times daily.      . methotrexate (RHEUMATREX) 15 MG tablet 8 tablets once weekly (on Saturday) Caution: Chemotherapy. Protect from light.      [name] omeprazole (PRILOSEC) 20 MG capsule TAKE ONE (1) CAPSULE EACH DAY  30 capsule  5  . predniSONE (DELTASONE) 2.5 MG tablet Take 20 mg by mouth every other day.      . pyridostigmine (MESTINON) 60 MG tablet Take 60 mg by mouth 3 (three) times daily.      . risperiDONE (RISPERDAL) 1 MG tablet Take 1 mg by mouth at bedtime.      . tolterodine (DETROL LA) 4 MG 24 hr capsule TAKE ONE CAPSULE BY MOUTH EACH MORNING AS DIRECTED  90 capsule  1   No current  facility-administered medications on file prior to visit.    Review of Systems Patient denies any headache, lightheadedness or dizziness.  No sinus or allergy symptoms.   No chest pain, tightness or palpitations.  No increased shortness of breath, cough or congestion.  No nausea or vomiting.  No abdominal pain or cramping.   No bowel change, such as diarrhea, constipation, BRBPR or melana.  No urine change.  Overall sister thinks she is doing well.  More interactive.       Objective:   Physical Exam  Filed Vitals:   05/26/13 1607  BP: 120/90  Pulse: 82  Temp: 98.1 F (36.7 C)   Blood pressure recheck:  8/44  63 year old female in no acute distress.   HEENT:  Nares- clear.  Oropharynx - without lesions. NECK:  Supple.  Nontender.  No audible bruit.  HEART:  Appears to be regular. LUNGS:  No crackles or wheezing audible.  Respirations even and unlabored.  RADIAL PULSE:  Equal bilaterally.  ABDOMEN:  Soft, nontender.  Bowel sounds present and normal.  No audible abdominal bruit.   EXTREMITIES:  No increased edema present.            Assessment & Plan:  PSYCH.  Seeing Dr Kasandra Knudsen.  Doing well on her  current regimen.   ABDOMINAL PAIN.  No pain reported today.   HYPERGLYCEMIA.  A1c 01/21/13 - 5.4.    HEALTH MAINTENANCE.  Pelvic/pap 01/21/13.  PAP negative with negative HPV.   Mammogram scheduled.   Sees GI.

## 2013-05-30 ENCOUNTER — Encounter: Payer: Self-pay | Admitting: Internal Medicine

## 2013-05-30 ENCOUNTER — Telehealth: Payer: Self-pay | Admitting: Internal Medicine

## 2013-05-30 NOTE — Assessment & Plan Note (Signed)
On Keppra.  Doing well.  Continues to follow up with Dr Potter.    

## 2013-05-30 NOTE — Assessment & Plan Note (Signed)
Blood pressure has been under good control.  Follow.   

## 2013-05-30 NOTE — Assessment & Plan Note (Signed)
On no cholesterol medication now.  Low cholesterol diet and exercise.  Follow lipid panel.   

## 2013-05-30 NOTE — Telephone Encounter (Signed)
FYI: Madison Burns wanted to let you know that Dr.  Melrose Nakayama took [place] off her seizure medication (Keppra)

## 2013-05-30 NOTE — Telephone Encounter (Signed)
Please schedule a fasting lab appt in 6 weeks.  Notify pts sister nancy of lab appt date and time.  Thanks.

## 2013-05-30 NOTE — Telephone Encounter (Signed)
Noted  

## 2013-05-30 NOTE — Telephone Encounter (Signed)
Madison Burns aware of appointment date and time 5/27  Madison Burns also wanted to let you know dr Melrose Nakayama took becky off her seizure med  Levaquin

## 2013-05-30 NOTE — Assessment & Plan Note (Signed)
Sees Dr Potter.  Tapering prednisone.  Doing well.   

## 2013-05-30 NOTE — Assessment & Plan Note (Signed)
Seeing Dr Koleen Nimrod for melanoma.

## 2013-05-30 NOTE — Assessment & Plan Note (Signed)
Stable.  Continues to follow up with GI.   

## 2013-06-01 ENCOUNTER — Ambulatory Visit: Payer: Self-pay | Admitting: Internal Medicine

## 2013-06-01 DIAGNOSIS — Z1231 Encounter for screening mammogram for malignant neoplasm of breast: Secondary | ICD-10-CM | POA: Diagnosis not present

## 2013-06-01 LAB — HM MAMMOGRAPHY: HM MAMMO: NEGATIVE

## 2013-06-03 ENCOUNTER — Encounter: Payer: Self-pay | Admitting: Internal Medicine

## 2013-06-15 ENCOUNTER — Other Ambulatory Visit: Payer: Self-pay | Admitting: Internal Medicine

## 2013-07-04 DIAGNOSIS — F71 Moderate intellectual disabilities: Secondary | ICD-10-CM | POA: Diagnosis not present

## 2013-07-04 DIAGNOSIS — F39 Unspecified mood [affective] disorder: Secondary | ICD-10-CM | POA: Diagnosis not present

## 2013-07-12 ENCOUNTER — Other Ambulatory Visit: Payer: Self-pay | Admitting: Internal Medicine

## 2013-07-12 DIAGNOSIS — N3281 Overactive bladder: Secondary | ICD-10-CM

## 2013-07-12 NOTE — Telephone Encounter (Signed)
RF for detrol sent to [place]

## 2013-07-13 ENCOUNTER — Other Ambulatory Visit (INDEPENDENT_AMBULATORY_CARE_PROVIDER_SITE_OTHER): Payer: Medicare Other

## 2013-07-13 DIAGNOSIS — E7800 Pure hypercholesterolemia, unspecified: Secondary | ICD-10-CM

## 2013-07-13 DIAGNOSIS — R7309 Other abnormal glucose: Secondary | ICD-10-CM | POA: Diagnosis not present

## 2013-07-13 DIAGNOSIS — I10 Essential (primary) hypertension: Secondary | ICD-10-CM

## 2013-07-13 DIAGNOSIS — G7000 Myasthenia gravis without (acute) exacerbation: Secondary | ICD-10-CM

## 2013-07-13 DIAGNOSIS — R739 Hyperglycemia, unspecified: Secondary | ICD-10-CM

## 2013-07-14 LAB — CBC WITH DIFFERENTIAL/PLATELET
BASOS ABS: 0 10*3/uL (ref 0.0–0.1)
Basophils Relative: 0.5 % (ref 0.0–3.0)
Eosinophils Absolute: 0.2 10*3/uL (ref 0.0–0.7)
Eosinophils Relative: 1.9 % (ref 0.0–5.0)
HCT: 43.1 % (ref 36.0–46.0)
Hemoglobin: 14.3 g/dL (ref 12.0–15.0)
LYMPHS PCT: 23.2 % (ref 12.0–46.0)
Lymphs Abs: 1.9 10*3/uL (ref 0.7–4.0)
MCHC: 33.2 g/dL (ref 30.0–36.0)
MCV: 97.6 fl (ref 78.0–100.0)
MONOS PCT: 5.4 % (ref 3.0–12.0)
Monocytes Absolute: 0.4 10*3/uL (ref 0.1–1.0)
NEUTROS PCT: 69 % (ref 43.0–77.0)
Neutro Abs: 5.7 10*3/uL (ref 1.4–7.7)
Platelets: 232 10*3/uL (ref 150.0–400.0)
RBC: 4.42 Mil/uL (ref 3.87–5.11)
RDW: 15.9 % — AB (ref 11.5–15.5)
WBC: 8.3 10*3/uL (ref 4.0–10.5)

## 2013-07-14 LAB — HEMOGLOBIN A1C: HEMOGLOBIN A1C: 5.3 % (ref 4.6–6.5)

## 2013-07-14 LAB — LIPID PANEL
Cholesterol: 198 mg/dL (ref 0–200)
HDL: 39.9 mg/dL (ref >39.00)
LDL Cholesterol: 116 mg/dL — ABNORMAL HIGH (ref 0–99)
Total CHOL/HDL Ratio: 5
Triglycerides: 212 mg/dL — ABNORMAL HIGH (ref 0.0–149.0)
VLDL: 42.4 mg/dL — ABNORMAL HIGH (ref 0.0–40.0)

## 2013-07-14 LAB — COMPREHENSIVE METABOLIC PANEL
ALBUMIN: 3.6 g/dL (ref 3.5–5.2)
ALT: 22 U/L (ref 0–35)
AST: 27 U/L (ref 0–37)
Alkaline Phosphatase: 70 U/L (ref 39–117)
BUN: 12 mg/dL (ref 6–23)
CALCIUM: 8.9 mg/dL (ref 8.4–10.5)
CHLORIDE: 111 meq/L (ref 96–112)
CO2: 26 meq/L (ref 19–32)
Creatinine, Ser: 0.9 mg/dL (ref 0.4–1.2)
GFR: 67.41 mL/min (ref >60.00)
Glucose, Bld: 94 mg/dL (ref 70–99)
POTASSIUM: 3.5 meq/L (ref 3.5–5.1)
Sodium: 145 mEq/L (ref 135–145)
TOTAL PROTEIN: 6 g/dL (ref 6.0–8.3)
Total Bilirubin: 0.7 mg/dL (ref 0.2–1.2)

## 2013-07-18 ENCOUNTER — Encounter: Payer: Self-pay | Admitting: *Deleted

## 2013-08-03 DIAGNOSIS — G7000 Myasthenia gravis without (acute) exacerbation: Secondary | ICD-10-CM | POA: Diagnosis not present

## 2013-08-03 DIAGNOSIS — E669 Obesity, unspecified: Secondary | ICD-10-CM | POA: Diagnosis not present

## 2013-08-03 DIAGNOSIS — R569 Unspecified convulsions: Secondary | ICD-10-CM | POA: Diagnosis not present

## 2013-08-03 DIAGNOSIS — IMO0002 Reserved for concepts with insufficient information to code with codable children: Secondary | ICD-10-CM | POA: Diagnosis not present

## 2013-09-01 DIAGNOSIS — F39 Unspecified mood [affective] disorder: Secondary | ICD-10-CM | POA: Diagnosis not present

## 2013-09-01 DIAGNOSIS — F71 Moderate intellectual disabilities: Secondary | ICD-10-CM | POA: Diagnosis not present

## 2013-09-01 DIAGNOSIS — F411 Generalized anxiety disorder: Secondary | ICD-10-CM | POA: Diagnosis not present

## 2013-09-27 ENCOUNTER — Encounter: Payer: Self-pay | Admitting: Internal Medicine

## 2013-09-27 ENCOUNTER — Ambulatory Visit (INDEPENDENT_AMBULATORY_CARE_PROVIDER_SITE_OTHER): Payer: Medicare Other | Admitting: Internal Medicine

## 2013-09-27 VITALS — BP 130/80 | HR 88 | Resp 14 | Ht 64.5 in | Wt 221.8 lb

## 2013-09-27 DIAGNOSIS — G40909 Epilepsy, unspecified, not intractable, without status epilepticus: Secondary | ICD-10-CM

## 2013-09-27 DIAGNOSIS — E7800 Pure hypercholesterolemia, unspecified: Secondary | ICD-10-CM

## 2013-09-27 DIAGNOSIS — K589 Irritable bowel syndrome without diarrhea: Secondary | ICD-10-CM | POA: Diagnosis not present

## 2013-09-27 DIAGNOSIS — I10 Essential (primary) hypertension: Secondary | ICD-10-CM

## 2013-09-27 DIAGNOSIS — R7309 Other abnormal glucose: Secondary | ICD-10-CM

## 2013-09-27 DIAGNOSIS — G7000 Myasthenia gravis without (acute) exacerbation: Secondary | ICD-10-CM

## 2013-09-27 DIAGNOSIS — R635 Abnormal weight gain: Secondary | ICD-10-CM

## 2013-09-27 DIAGNOSIS — R739 Hyperglycemia, unspecified: Secondary | ICD-10-CM

## 2013-09-27 DIAGNOSIS — C439 Malignant melanoma of skin, unspecified: Secondary | ICD-10-CM | POA: Diagnosis not present

## 2013-09-27 NOTE — Progress Notes (Signed)
Pre visit review using our clinic review tool, if applicable. No additional management support is needed unless otherwise documented below in the visit note. 

## 2013-09-27 NOTE — Patient Instructions (Signed)
Zantac (ranitidine) 150mg - take one tablet 30 minutes before your evening meal 

## 2013-10-02 ENCOUNTER — Encounter: Payer: Self-pay | Admitting: Internal Medicine

## 2013-10-02 NOTE — Assessment & Plan Note (Signed)
On no cholesterol medication now.  Low cholesterol diet and exercise.  Follow lipid panel.

## 2013-10-02 NOTE — Progress Notes (Signed)
Subjective:    Patient ID: Madison Burns, female    DOB: 1950/07/30, 63 y.o.   MRN: [id number]  HPI 63 year old female with past history of myasthenia gravis, hypertension, hypercholesterolemia and seizure disorder who comes in today for a scheduled follow up.  She is accompanied by her sister (her caretaker).  History obtained from both of them.   Sister states Madison Burns has been doing well.  Continuing to taper down her Prednisone.  Seeing Dr Melrose Nakayama for her myasthenia gravis.  He is controlling the taper.  Sleeping better.  On risperdol.  Sees Dr Kasandra Knudsen.  Eating and drinking well.  No bowel complaints.  She has seen Dr Koleen Nimrod.  Had melanoma - right neck.   Bowels stable.  No urinary symptoms reported.  She is more interactive.  Overall doing well.  Has a left eye lesion.  Planning to have removed.  Plans to f/u with Dr Koleen Nimrod regarding an anterior chest lesion.      Past Medical History  Diagnosis Date  . GERD (gastroesophageal reflux disease)   . Hypertension   . Hypercholesterolemia   . History of seizure disorder   . Myasthenia gravis   . Irritable bowel syndrome     Current Outpatient Prescriptions on File Prior to Visit  Medication Sig Dispense Refill  . methotrexate (RHEUMATREX) 15 MG tablet 8 tablets once weekly (on Saturday) Caution: Chemotherapy. Protect from light.      [name] omeprazole (PRILOSEC) 20 MG capsule TAKE ONE (1) CAPSULE EACH DAY  30 capsule  7  . predniSONE (DELTASONE) 2.5 MG tablet Take 2.5 mg by mouth every other day.       . pyridostigmine (MESTINON) 60 MG tablet Take 60 mg by mouth 3 (three) times daily.      [name] tolterodine (DETROL Burns) 4 MG 24 hr capsule TAKE 1 CAPSULE EVERY MORNING AS DIRECTED  90 capsule  3   No current facility-administered medications on file prior to visit.    Review of Systems Patient denies any headache, lightheadedness or dizziness.  No sinus or allergy symptoms.   No chest pain, tightness or palpitations.  No increased shortness of  breath, cough or congestion.  No nausea or vomiting.  No abdominal pain or cramping.   No bowel change, such as diarrhea, constipation, BRBPR or melana.  No urine change.  Overall sister thinks she is doing well.  More interactive.  Planning to follow up with Dr Koleen Nimrod.       Objective:   Physical Exam  Filed Vitals:   09/27/13 1607  BP: 130/80  Pulse: 88  Resp: 14   Blood pressure recheck:  77/6  63 year old female in no acute distress.   HEENT:  Nares- clear.  Oropharynx - without lesions. NECK:  Supple.  Nontender.  No audible bruit.  HEART:  Appears to be regular. LUNGS:  No crackles or wheezing audible.  Respirations even and unlabored.  RADIAL PULSE:  Equal bilaterally.  ABDOMEN:  Soft, nontender.  Bowel sounds present and normal.  No audible abdominal bruit.   EXTREMITIES:  No increased edema present.            Assessment & Plan:  PSYCH.  Seeing Dr Kasandra Knudsen.  Doing well on her current regimen.   ABDOMINAL PAIN.  No pain reported today.   HYPERGLYCEMIA.  A1c 07/13/13 - 5.[name]    HEALTH MAINTENANCE.  Pelvic/pap 01/21/13.  PAP negative with negative HPV.   Mammogram 06/01/13 - Birads I.  Sees  GI.

## 2013-10-02 NOTE — Assessment & Plan Note (Signed)
Seeing Dr Koleen Nimrod for melanoma.  Is planning to follow up regarding a lesion on her anterior chest.

## 2013-10-02 NOTE — Assessment & Plan Note (Signed)
On Keppra.  Doing well.  Continues to follow up with Dr Melrose Nakayama.

## 2013-10-02 NOTE — Assessment & Plan Note (Signed)
Stable.  Continues to follow up with GI.

## 2013-10-02 NOTE — Assessment & Plan Note (Signed)
Sees Dr Melrose Nakayama.  Tapering prednisone.  Doing well.

## 2013-10-02 NOTE — Assessment & Plan Note (Signed)
Blood pressure has been under good control.  Follow.   

## 2013-11-21 DIAGNOSIS — F419 Anxiety disorder, unspecified: Secondary | ICD-10-CM | POA: Diagnosis not present

## 2013-11-21 DIAGNOSIS — F71 Moderate intellectual disabilities: Secondary | ICD-10-CM | POA: Diagnosis not present

## 2013-12-05 DIAGNOSIS — G7000 Myasthenia gravis without (acute) exacerbation: Secondary | ICD-10-CM | POA: Diagnosis not present

## 2013-12-05 DIAGNOSIS — R451 Restlessness and agitation: Secondary | ICD-10-CM | POA: Diagnosis not present

## 2013-12-05 DIAGNOSIS — R569 Unspecified convulsions: Secondary | ICD-10-CM | POA: Diagnosis not present

## 2013-12-05 DIAGNOSIS — E669 Obesity, unspecified: Secondary | ICD-10-CM | POA: Diagnosis not present

## 2014-01-20 ENCOUNTER — Other Ambulatory Visit: Payer: Medicare Other

## 2014-01-23 ENCOUNTER — Other Ambulatory Visit (INDEPENDENT_AMBULATORY_CARE_PROVIDER_SITE_OTHER): Payer: Medicare Other

## 2014-01-23 DIAGNOSIS — R739 Hyperglycemia, unspecified: Secondary | ICD-10-CM

## 2014-01-23 DIAGNOSIS — E7800 Pure hypercholesterolemia, unspecified: Secondary | ICD-10-CM

## 2014-01-23 DIAGNOSIS — R635 Abnormal weight gain: Secondary | ICD-10-CM

## 2014-01-23 DIAGNOSIS — I10 Essential (primary) hypertension: Secondary | ICD-10-CM

## 2014-01-23 LAB — COMPREHENSIVE METABOLIC PANEL
ALBUMIN: 3.5 g/dL (ref 3.5–5.2)
ALT: 34 U/L (ref 0–35)
AST: 47 U/L — AB (ref 0–37)
Alkaline Phosphatase: 71 U/L (ref 39–117)
BUN: 9 mg/dL (ref 6–23)
CALCIUM: 8.8 mg/dL (ref 8.4–10.5)
CO2: 29 mEq/L (ref 19–32)
CREATININE: 1 mg/dL (ref 0.4–1.2)
Chloride: 103 mEq/L (ref 96–112)
GFR: 61.72 mL/min (ref >60.00)
GLUCOSE: 103 mg/dL — AB (ref 70–99)
Potassium: 3.4 mEq/L — ABNORMAL LOW (ref 3.5–5.1)
Sodium: 141 mEq/L (ref 135–145)
Total Bilirubin: 0.8 mg/dL (ref 0.2–1.2)
Total Protein: 5.9 g/dL — ABNORMAL LOW (ref 6.0–8.3)

## 2014-01-23 LAB — LIPID PANEL
CHOL/HDL RATIO: 6
CHOLESTEROL: 216 mg/dL — AB (ref 0–200)
HDL: 34.6 mg/dL — ABNORMAL LOW (ref >39.00)
NonHDL: 181.4
TRIGLYCERIDES: 209 mg/dL — AB (ref 0.0–149.0)
VLDL: 41.8 mg/dL — ABNORMAL HIGH (ref 0.0–40.0)

## 2014-01-23 LAB — HEMOGLOBIN A1C: Hgb A1c MFr Bld: 5.8 % (ref 4.6–6.5)

## 2014-01-23 LAB — LDL CHOLESTEROL, DIRECT: LDL DIRECT: 150.1 mg/dL

## 2014-01-23 LAB — TSH: TSH: 7.07 u[IU]/mL — AB (ref 0.35–4.50)

## 2014-01-27 ENCOUNTER — Encounter: Payer: Self-pay | Admitting: Internal Medicine

## 2014-01-27 ENCOUNTER — Ambulatory Visit (INDEPENDENT_AMBULATORY_CARE_PROVIDER_SITE_OTHER): Payer: Medicare Other | Admitting: Internal Medicine

## 2014-01-27 VITALS — BP 130/90 | HR 78 | Temp 98.1°F | Ht 64.5 in | Wt 214.5 lb

## 2014-01-27 DIAGNOSIS — I10 Essential (primary) hypertension: Secondary | ICD-10-CM | POA: Diagnosis not present

## 2014-01-27 DIAGNOSIS — E7800 Pure hypercholesterolemia, unspecified: Secondary | ICD-10-CM

## 2014-01-27 DIAGNOSIS — E669 Obesity, unspecified: Secondary | ICD-10-CM

## 2014-01-27 DIAGNOSIS — R7989 Other specified abnormal findings of blood chemistry: Secondary | ICD-10-CM | POA: Diagnosis not present

## 2014-01-27 DIAGNOSIS — C439 Malignant melanoma of skin, unspecified: Secondary | ICD-10-CM | POA: Diagnosis not present

## 2014-01-27 DIAGNOSIS — G40909 Epilepsy, unspecified, not intractable, without status epilepticus: Secondary | ICD-10-CM

## 2014-01-27 DIAGNOSIS — R739 Hyperglycemia, unspecified: Secondary | ICD-10-CM | POA: Diagnosis not present

## 2014-01-27 DIAGNOSIS — E875 Hyperkalemia: Secondary | ICD-10-CM

## 2014-01-27 DIAGNOSIS — R109 Unspecified abdominal pain: Secondary | ICD-10-CM

## 2014-01-27 DIAGNOSIS — G7000 Myasthenia gravis without (acute) exacerbation: Secondary | ICD-10-CM

## 2014-01-27 DIAGNOSIS — R945 Abnormal results of liver function studies: Secondary | ICD-10-CM

## 2014-01-27 LAB — HEPATIC FUNCTION PANEL
ALBUMIN: 3.7 g/dL (ref 3.5–5.2)
ALT: 33 U/L (ref 0–35)
AST: 35 U/L (ref 0–37)
Alkaline Phosphatase: 75 U/L (ref 39–117)
BILIRUBIN DIRECT: 0.1 mg/dL (ref 0.0–0.3)
TOTAL PROTEIN: 6 g/dL (ref 6.0–8.3)
Total Bilirubin: 0.6 mg/dL (ref 0.2–1.2)

## 2014-01-27 LAB — POTASSIUM: POTASSIUM: 3.5 meq/L (ref 3.5–5.1)

## 2014-01-27 NOTE — Progress Notes (Signed)
Subjective:    Patient ID: Madison Burns, female    DOB: [birth year], 63 y.o.   MRN: [id number]  HPI 63 year old female with past history of myasthenia gravis, hypertension, hypercholesterolemia and seizure disorder who comes in today for a scheduled follow up.  She is accompanied by her sister (her caretaker).  History obtained from both of them.   Sister states [name] has been doing well.  She is off prednisone.  Has been seeing Dr Melrose Nakayama for her myasthenia gravis.  Noticed recently one eye drooping more.  Saw Dr Melrose Nakayama.  Sister request to be established with [facility].  She is seen there and prefers [name] to be established with neurology there.  Sleeping better.  On risperdol. Sees Dr Kasandra Knudsen.  Eating and drinking well.  No bowel complaints.  She has seen Dr Koleen Nimrod.  Had melanoma - right neck.    No urinary symptoms reported.  She is more interactive.  Overall doing well.  Recent cholesterol elevated.  Also noted to have slight elevation is AST and TSH.  Will follow TSH.  Discussed the possibility of fatty liver and importance of diet and exercise.       Past Medical History  Diagnosis Date  . GERD (gastroesophageal reflux disease)   . Hypertension   . Hypercholesterolemia   . History of seizure disorder   . Myasthenia gravis   . Irritable bowel syndrome     Current Outpatient Prescriptions on File Prior to Visit  Medication Sig Dispense Refill  . methotrexate (RHEUMATREX) 15 MG tablet 8 tablets once weekly (on Saturday) Caution: Chemotherapy. Protect from light.    [name] omeprazole (PRILOSEC) 20 MG capsule TAKE ONE (1) CAPSULE EACH DAY 30 capsule 7  . pyridostigmine (MESTINON) 60 MG tablet Take 60 mg by mouth 3 (three) times daily.    . risperiDONE (RISPERDAL) 2 MG tablet Take 2 mg by mouth at bedtime.    . tolterodine (DETROL Burns) 4 MG 24 hr capsule TAKE 1 CAPSULE EVERY MORNING AS DIRECTED 90 capsule 3   No current facility-administered medications on file prior to visit.     Review of Systems Patient denies any headache, lightheadedness or dizziness.  No sinus or allergy symptoms.   No chest pain, tightness or palpitations.  No increased shortness of breath, cough or congestion.  No nausea or vomiting.  No abdominal pain or cramping.   No bowel change, such as diarrhea, constipation, BRBPR or melana.  No urine change.  Overall sister thinks she is doing well.  More interactive.  Sister request to be established with a different neurologist.  13     Objective:   Physical Exam  Filed Vitals:   01/27/14 1105  BP: 130/90  Pulse: 78  Temp: 98.1 F (36.7 C)   Blood pressure recheck:  38/28  63 year old female in no acute distress.   HEENT:  Nares- clear.  Oropharynx - without lesions. NECK:  Supple.  Nontender.  No audible bruit.  HEART:  Appears to be regular. LUNGS:  No crackles or wheezing audible.  Respirations even and unlabored.  RADIAL PULSE:  Equal bilaterally.  ABDOMEN:  Soft.  Minimal tenderness to palpation.  Bowel sounds present and normal.  No audible abdominal bruit.   EXTREMITIES:  No increased edema present.            Assessment & Plan:  1. Hyperkalemia Slight elevation on recent labs.  Recheck today. - Potassium  2. Abnormal liver function tests See above.  Recheck liver panel.  On MTX.  Also discussed the possibility of fatty liver. - Hepatic function panel  3. Myasthenia gravis Has been seeing Dr Melrose Nakayama.  Eye drooping noticed recently.  On mestinon.  Sister request for her to be established with [facility] (Dr Jasper Loser).   - Ambulatory referral to Neurology  4. Obesity (BMI 30-39.9) Discussed diet and exercise.    5. Essential hypertension Blood pressure has been under reasonable control.  Follow.    6. Seizure disorder No longer taking keppra.  Has been seeing Dr Melrose Nakayama.    7. Hypercholesterolemia Cholesterol elevated on recent check.  Discussed diet and exercise.  Hold on medication.  Follow.  Recheck  liver panel.   Lab Results  Component Value Date   CHOL 216* 01/23/2014   HDL 34.60* 01/23/2014   LDLCALC 116* 07/13/2013   LDLDIRECT 150.1 01/23/2014   TRIG 209.0* 01/23/2014   CHOLHDL 6 01/23/2014   8. Melanoma Was seeing Dr Koleen Nimrod.  Will need to continue to f/u with dermatology.    9. Hyperglycemia Low carb diet and exercise.  Weight loss.  Follow a1c. Recent check ok.  Lab Results  Component Value Date   HGBA1C 5.8 01/23/2014   10. Abdominal pain, unspecified abdominal location Minimal pain noted on exam.  Sister states has not been complaining of pain.  Will monitor bowels to confirm no constipation.  Is eating and drinking well.  Follow.  Notify me if problems.    11. PSYCH.  Seeing Dr Kasandra Knudsen.  Doing well on her current regimen.   HEALTH MAINTENANCE.  Pelvic/pap 01/21/13.  PAP negative with negative HPV.   Mammogram 06/01/13 - Birads I.  Sees GI.  Schedule her for a physical.

## 2014-01-27 NOTE — Progress Notes (Signed)
Pre visit review using our clinic review tool, if applicable. No additional management support is needed unless otherwise documented below in the visit note. 

## 2014-01-28 ENCOUNTER — Other Ambulatory Visit: Payer: Self-pay | Admitting: Internal Medicine

## 2014-01-28 DIAGNOSIS — R7989 Other specified abnormal findings of blood chemistry: Secondary | ICD-10-CM

## 2014-01-28 NOTE — Progress Notes (Signed)
Order placed for f/u tsh.  

## 2014-01-29 ENCOUNTER — Encounter: Payer: Self-pay | Admitting: Internal Medicine

## 2014-01-29 DIAGNOSIS — R739 Hyperglycemia, unspecified: Secondary | ICD-10-CM | POA: Insufficient documentation

## 2014-01-29 DIAGNOSIS — E669 Obesity, unspecified: Secondary | ICD-10-CM | POA: Insufficient documentation

## 2014-01-29 DIAGNOSIS — R109 Unspecified abdominal pain: Secondary | ICD-10-CM | POA: Insufficient documentation

## 2014-01-30 ENCOUNTER — Encounter: Payer: Self-pay | Admitting: *Deleted

## 2014-02-13 ENCOUNTER — Other Ambulatory Visit: Payer: Self-pay | Admitting: Internal Medicine

## 2014-02-22 DIAGNOSIS — F419 Anxiety disorder, unspecified: Secondary | ICD-10-CM | POA: Diagnosis not present

## 2014-02-22 DIAGNOSIS — F71 Moderate intellectual disabilities: Secondary | ICD-10-CM | POA: Diagnosis not present

## 2014-02-22 DIAGNOSIS — Z79899 Other long term (current) drug therapy: Secondary | ICD-10-CM | POA: Diagnosis not present

## 2014-02-27 ENCOUNTER — Other Ambulatory Visit (INDEPENDENT_AMBULATORY_CARE_PROVIDER_SITE_OTHER): Payer: Medicare Other

## 2014-02-27 DIAGNOSIS — R946 Abnormal results of thyroid function studies: Secondary | ICD-10-CM

## 2014-02-27 DIAGNOSIS — R7989 Other specified abnormal findings of blood chemistry: Secondary | ICD-10-CM

## 2014-02-27 LAB — TSH: TSH: 6.02 u[IU]/mL — ABNORMAL HIGH (ref 0.35–4.50)

## 2014-02-28 ENCOUNTER — Other Ambulatory Visit: Payer: Self-pay | Admitting: *Deleted

## 2014-02-28 ENCOUNTER — Other Ambulatory Visit: Payer: Self-pay | Admitting: Internal Medicine

## 2014-02-28 DIAGNOSIS — E039 Hypothyroidism, unspecified: Secondary | ICD-10-CM

## 2014-02-28 MED ORDER — LEVOTHYROXINE SODIUM 50 MCG PO TABS: 50.0000 ug | ORAL_TABLET | Freq: Every day | ORAL | Status: DC

## 2014-02-28 NOTE — Progress Notes (Signed)
Order placed for f/u tsh.  

## 2014-03-06 ENCOUNTER — Ambulatory Visit (INDEPENDENT_AMBULATORY_CARE_PROVIDER_SITE_OTHER): Payer: Medicare Other | Admitting: Internal Medicine

## 2014-03-06 ENCOUNTER — Encounter: Payer: Self-pay | Admitting: Internal Medicine

## 2014-03-06 VITALS — BP 130/100 | HR 97 | Temp 98.3°F | Ht 64.5 in | Wt 219.5 lb

## 2014-03-06 DIAGNOSIS — R109 Unspecified abdominal pain: Secondary | ICD-10-CM | POA: Diagnosis not present

## 2014-03-06 DIAGNOSIS — E7800 Pure hypercholesterolemia, unspecified: Secondary | ICD-10-CM

## 2014-03-06 DIAGNOSIS — G7000 Myasthenia gravis without (acute) exacerbation: Secondary | ICD-10-CM | POA: Diagnosis not present

## 2014-03-06 DIAGNOSIS — I10 Essential (primary) hypertension: Secondary | ICD-10-CM

## 2014-03-06 DIAGNOSIS — E669 Obesity, unspecified: Secondary | ICD-10-CM

## 2014-03-06 DIAGNOSIS — G40909 Epilepsy, unspecified, not intractable, without status epilepticus: Secondary | ICD-10-CM | POA: Diagnosis not present

## 2014-03-06 DIAGNOSIS — R739 Hyperglycemia, unspecified: Secondary | ICD-10-CM

## 2014-03-06 NOTE — Progress Notes (Signed)
Pre visit review using our clinic review tool, if applicable. No additional management support is needed unless otherwise documented below in the visit note. 

## 2014-03-06 NOTE — Progress Notes (Signed)
Subjective:    Patient ID: Madison Burns, female    DOB: 01/15/1951, 64 y.o.   MRN: [id number]  HPI 64 year old female with past history of myasthenia gravis, hypertension, hypercholesterolemia and seizure disorder who comes in today for a scheduled follow up.  She is accompanied by her sister (her caretaker).  History obtained from both of them.   Sister states Madison Burns has been doing well.  She is off prednisone.  Has been seeing Dr Melrose Nakayama for her myasthenia gravis.  Noticed recently one eye drooping more.  Saw Dr Melrose Nakayama.  Sister request to be established with [facility].  Has an appt with [facility] next week.  On risperdol. Sees Dr Kasandra Knudsen.  Eating and drinking well.  No bowel complaints.  She has seen Dr Koleen Nimrod.  Had melanoma - right neck.    No urinary symptoms reported.  Overall doing well.  Recent cholesterol elevated.  Discussed with her and her sister today.  Sister desires for her not to be placed on cholesterol medication.   Recent liver panel - wnl.    TSH slightly elevated.  Started on synthroid.  Has a cyst like lesion - left eye.  Sees Dr George Ina.       Past Medical History  Diagnosis Date  . GERD (gastroesophageal reflux disease)   . Hypertension   . Hypercholesterolemia   . History of seizure disorder   . Myasthenia gravis   . Irritable bowel syndrome     Current Outpatient Prescriptions on File Prior to Visit  Medication Sig Dispense Refill  . levothyroxine (SYNTHROID, LEVOTHROID) 50 MCG tablet Take 1 tablet (50 mcg total) by mouth daily. 30 tablet 1  . methotrexate (RHEUMATREX) 15 MG tablet 8 tablets once weekly (on Saturday) Caution: Chemotherapy. Protect from light.    [name] omeprazole (PRILOSEC) 20 MG capsule TAKE ONE (1) CAPSULE EACH DAY 30 capsule 5  . pyridostigmine (MESTINON) 60 MG tablet Take 60 mg by mouth 3 (three) times daily.    . risperiDONE (RISPERDAL) 2 MG tablet Take 2 mg by mouth at bedtime.    . tolterodine (DETROL Burns) 4 MG 24 hr capsule TAKE 1  CAPSULE EVERY MORNING AS DIRECTED 90 capsule 3   No current facility-administered medications on file prior to visit.    Review of Systems Patient denies any headache, lightheadedness or dizziness.  No sinus or allergy symptoms.   No chest pain, tightness or palpitations.  No increased shortness of breath, cough or congestion.  No nausea or vomiting.  No abdominal pain on exam. No bowel change, such as diarrhea, constipation, BRBPR or melana.  No urine change.  Overall sister thinks she is doing well.  Discussed elevated cholesterol.  Sister declines for her to be placed on cholesterol medication.  Eyelid lesion as outlined.       Objective:   Physical Exam  Filed Vitals:   03/06/14 0921  BP: 130/100  Pulse: 97  Temp: 98.3 F (36.8 C)   Blood pressure recheck:  71/44  64 year old female in no acute distress.   HEENT:  Nares- clear.  Oropharynx - without lesions. NECK:  Supple.  Nontender.  No audible bruit.  HEART:  Appears to be regular. LUNGS:  No crackles or wheezing audible.  Respirations even and unlabored.  RADIAL PULSE:  Equal bilaterally.  ABDOMEN:  Soft.  No tenderness to palpation.  Bowel sounds present and normal.  No audible abdominal bruit.   EXTREMITIES:  No increased edema present.  Assessment & Plan:  1. Essential hypertension Blood pressure on recheck better.  Have them spot check her pressure.  Send in readings.  Follow.    2. Seizure disorder Doing well.    3. Myasthenia gravis Was seeing Dr Melrose Nakayama.  Off prednisone.  Has a f[facility]u app[facility] next [facility] at [facility].    4. Hyperglycemia Low carb diet and exercise.  Weight loss.   Lab Results  Component Value Date   HGBA1C 5.8 01/23/2014   5. Obesity (BMI 30-39.9) Diet and exercise.    6. Hypercholesterolemia Low cholesterol diet and exercise.  Sister declines for her to be placed on cholesterol medication.  Follow.    7. Abdominal pain, unspecified abdominal location No pain on exam.   Follow.  Sister reports that Madison Burns has not complained of abdominal pain.    8. PSYCH.  Seeing Dr Kasandra Knudsen.  Doing well on her current regimen.   9.  EYELID LESION.  Cyst like lesion.  Her sister with schedule an appt with Dr George Ina.  Due f/u eye exam.    HEALTH MAINTENANCE.  Pelvic/pap 01/21/13.  PAP negative with negative HPV.   Mammogram 06/01/13 - Birads I.  Sees GI.  Schedule her for a physical.    I spent 25 minutes with the patient and more than 50% of the time was spent in consultation regarding the above.

## 2014-03-08 DIAGNOSIS — Z961 Presence of intraocular lens: Secondary | ICD-10-CM | POA: Diagnosis not present

## 2014-03-13 ENCOUNTER — Ambulatory Visit: Payer: Medicare Other | Admitting: Neurology

## 2014-03-20 ENCOUNTER — Ambulatory Visit (INDEPENDENT_AMBULATORY_CARE_PROVIDER_SITE_OTHER): Payer: Medicare Other | Admitting: Neurology

## 2014-03-20 ENCOUNTER — Encounter: Payer: Self-pay | Admitting: Neurology

## 2014-03-20 ENCOUNTER — Other Ambulatory Visit: Payer: Self-pay | Admitting: *Deleted

## 2014-03-20 VITALS — BP 150/90 | HR 98 | Resp 14 | Ht 63.75 in | Wt 218.0 lb

## 2014-03-20 DIAGNOSIS — E662 Morbid (severe) obesity with alveolar hypoventilation: Secondary | ICD-10-CM | POA: Diagnosis not present

## 2014-03-20 DIAGNOSIS — G4730 Sleep apnea, unspecified: Secondary | ICD-10-CM | POA: Diagnosis not present

## 2014-03-20 DIAGNOSIS — G4710 Hypersomnia, unspecified: Secondary | ICD-10-CM

## 2014-03-20 DIAGNOSIS — G7001 Myasthenia gravis with (acute) exacerbation: Secondary | ICD-10-CM

## 2014-03-20 DIAGNOSIS — G7000 Myasthenia gravis without (acute) exacerbation: Secondary | ICD-10-CM

## 2014-03-20 MED ORDER — PYRIDOSTIGMINE BROMIDE 60 MG PO TABS: ORAL_TABLET | ORAL | Status: DC

## 2014-03-20 MED ORDER — METHOTREXATE 2.5 MG PO TABS: ORAL_TABLET | ORAL | Status: DC

## 2014-03-20 NOTE — Progress Notes (Signed)
Placed orders for Dr. Brett Fairy when pt in for NP appt.

## 2014-03-20 NOTE — Patient Instructions (Addendum)
Dear Madison Burns,   I will need Dr. Melrose Nakayama 's last imaging orders resulted. MRI brain and labs. I will follow the methotrexate labs, CMET and diff CBC.   I will order a sleep study .  I will order a chest CT to look for THYMOMA. Congenital Myasthenia Congenital myasthenia is a group of disorders that affect the transmission of signals from the nerves to the muscles. These disorders are present at birth (congenital). CAUSES  There are 2 different kinds of causes:  Genetic. The genetic type involves a defect in 1 of the more than 30 molecules associated with neuromuscular transmission. Depending on which molecules are involved, this form may or may not be treatable.  Autoimmune. There are several different autoimmune forms. These forms may either be temporary or permanent. SYMPTOMS  Symptoms are usually noticed in early childhood and include:  Drooping eyelids.  Facial weakness.  Difficulty chewing.  Limb weakness. At the time of birth, an infant may have severe generalized weakness. The weakness may be so severe that the child cannot breathe. Parents of children with congenital myasthenia frequently show no symptoms of the disorder. DIAGNOSIS  Diagnosis usually involves the following initial tests:  Tests to study the electrical activity of muscles while they are working and while they are at rest (electromyography, EMG).  Nerve conduction tests to study how well and how fast nerves work. If the initial tests show that there may be a problem, further blood work will be done to determine if the patient has a genetic form or an autoimmune form. TREATMENT   Cholinesterase inhibitor medicines may improve the symptoms of congenital myasthenia by affecting nerves and muscles. Other medicines may improve symptoms by altering the immune system.  Some forms of the autoimmune type may benefit from the removal of the thymus gland which produces the antibodies that are causing the  weakness. Document Released: 01/24/2002 Document Revised: 04/28/2011 Document Reviewed: 01/28/2008 [facility] Patient Information 2015 [place], [place]. This information is not intended to replace advice given to you by your health care provider. Make sure you discuss any questions you have with your health care provider.

## 2014-03-20 NOTE — Sleep Study (Signed)
Please see the scanned sleep study interpretation located in the Procedure tab within the Chart Review section. 

## 2014-03-20 NOTE — Progress Notes (Signed)
SLEEP MEDICINE CLINIC   Provider:  [name], M D  Referring Provider: Einar Pheasant, MD Primary Care Physician:  Einar Pheasant, MD  Chief Complaint  Patient presents with  . NP  Madison Burns, Myasthenia Gravis    Rm 60, sister and mother    HPI:  Madison Burns is a 64 y.o. female seen here as a referral from Dr. Nicki Reaper for Myasthenia gravis, per special request of the patient.     Madison Burns carries a diagnosis of myasthenia gravis associated with difficulties swallowing, with a decreased level of energy a high degree of fatigue sometimes best interest in activities facial droop eyelid droop sleepiness snoring easy bruising, coughing, trouble swallowing she has a mild hoarseness but not as significant dysphonia and incontinence.  Her family and the patient looking for a maintenance  Neurologist to keep her myasthenia under control. The patient is sleeping 12 hours easily per day, from 10 Pm to 8 AM. She has a droopy right eye.  She missed frequently the 3rd. Mestinon.  She has been followed by Dr. Melrose Nakayama at the [facility] clinic following Dr Carlis Abbott. The patient was diagnosed in 2011 with myasthenia gravis. She was hospitalized with myasthnic crisis Jan 27 2010- through [facility] 14th 2012. She was send to rehabilitation after that. Her sister noticed a decline in cognitive skills at that time.   Again,  her first treatment had been prednisone and this was just weaned off by September 2015. The patient carries a diagnosis of hypercholesterolemia. She also has a past surgical history of appendectomy in 1999, hysterectomy in 2004 and the melanoma was removed in 2014 from the right side of the neck close to the TM joint.  Madison Burns is single without children,  Lives with her sister now used to live with her mother. She is MRDD, she left school in 7th grade, went to a special facility until her GED, [employer]. She used to work at a [employer] ,later in a vocational trade.   Madison Burns is taking  methotrexate to control them myasthenia she is also on Mestinon 60 mg 3 times a day she takes Detrol for urinary continence at 4 mg in the morning and she takes Risperdal 2 mg I mouth at bedtime. She is also treated for hypothyroidism with levothyroxine at 50 g daily. The methotrexate is given at 8 tablets once a week. Until last September 2015 the patient had been on prednisone. She has not had labs drawn for the last 5 month, while continuing on MTX.      Review of Systems: Out of a complete 14 system review, the patient complains of only the following symptoms, and all other reviewed systems are negative. ", Palpitations, weight gain on prednisone him a urinary incontinence, myalgia.learning disabilities. MRDD. Sleepiness worst in the morning,but sttaed she feels refreshed.  She .snores a little, she sleeps in a bed with 1 large pillow, she sleeps in daytime in a recliner.   Epworth score 15 , Fatigue severity score 46   , depression score n/a    History   Social History  . Marital Status: Single    Spouse Name: N/A    Number of Children: 0  . Years of Education: Special Ed   Occupational History  . Unemployed    Social History Main Topics  . Smoking status: Never Smoker   . Smokeless tobacco: Never Used  . Alcohol Use: No  . Drug Use: No  . Sexual Activity: Not on file  Other Topics Concern  . Not on file   Social History Narrative   Regular exercise-no   Caffeine Use-yes    Family History  Problem Relation Age of Onset  . CVA Maternal Grandfather   . Heart disease Father     myocardial infarction  . Hyperlipidemia Father   . Hyperlipidemia Sister   . Colon cancer Maternal Grandfather     stomach/colon  . Bone cancer      cousin  . Alzheimer's disease Mother     maternal aunts    Past Medical History  Diagnosis Date  . GERD (gastroesophageal reflux disease)   . Hypertension   . Hypercholesterolemia   . History of seizure disorder   . Myasthenia  gravis   . Irritable bowel syndrome     Past Surgical History  Procedure Laterality Date  . Cholecystectomy  2013  . Appendectomy  1999  . Abdominal hysterectomy    . Tonsillectomy      Current Outpatient Prescriptions  Medication Sig Dispense Refill  . levothyroxine (SYNTHROID, LEVOTHROID) 50 MCG tablet Take 1 tablet (50 mcg total) by mouth daily. 30 tablet 1  . methotrexate (RHEUMATREX) 2.5 MG tablet Take 2.5 mg by mouth once a week. Caution:Chemotherapy. Protect from light.  Total 20mg  weekly by taking 8 tablets.    [name] omeprazole (PRILOSEC) 20 MG capsule TAKE ONE (1) CAPSULE EACH DAY 30 capsule 5  . pyridostigmine (MESTINON) 60 MG tablet Take 60 mg by mouth 3 (three) times daily. Taking only BID.    [name] risperiDONE (RISPERDAL) 2 MG tablet Take 2 mg by mouth at bedtime.    . tolterodine (DETROL LA) 4 MG 24 hr capsule TAKE 1 CAPSULE EVERY MORNING AS DIRECTED 90 capsule 3   No current facility-administered medications for this visit.    Allergies as of 03/20/2014 - Review Complete 03/20/2014  Allergen Reaction Noted  . No known drug allergy  12/21/2011    Vitals: BP 150/90 mmHg  Pulse 98  Resp 14  Ht 5' 3.75" (1.619 m)  Wt 218 lb (98.884 kg)  BMI 37.73 kg/m2 Last Weight:  Wt Readings from Last 1 Encounters:  03/20/14 218 lb (98.884 kg)       Last Height:   Ht Readings from Last 1 Encounters:  03/20/14 5' 3.75" (1.619 m)    Physical exam:  General: The patient is awake, alert and appears not in acute distress. The patient is well groomed. Head: Normocephalic, atraumatic. Neck is supple. Mallampati 4  neck circumference 18 *.  Decreased Nasal airflow congestion related, , TMJ is  Not evident . Retrognathia seen. Macroglossia.  Cardiovascular:  Regular rate and rhythm , without  murmurs or carotid bruit, and without distended neck veins. Respiratory: Lungs are clear to auscultation. Skin:  Without evidence of edema, or rash Trunk: BMI is  elevated and patient  has normal  posture.  Neurologic exam : The patient is awake and alert, oriented to place and time.   Memory  intact.  Speech is not fluent with dysphonia ,  aphasia. Mood and affect are aloof . Cranial nerves: Pupils are equal and briskly reactive to light. Funduscopic exam without  evidence of pallor or edema.  Ptosis on the right. The upper eye lid touches the pupil. Extraocular movements  in vertical and horizontal planes intact and without nystagmus. Visual fields by finger perimetry are intact. Hearing to finger rub intact. Facial sensation intact to fine touch. Facial motor strength is symmetric and tongue and uvula move  midline.  Motor exam:  Decreased overall tone, weak grip, unable to step on the stepping stool, she i sits slouched, she needs assistance with turns.   Sensory:  Fine touch, pinprick and vibration were normal.  Coordination: Rapid alternating movements in the fingers/hands is normal.  Finger-to-nose maneuver  normal without evidence of ataxia, dysmetria or tremor.  Gait and station: Patient walks without assistive device and is able with some assistance to climb up to the exam table.  Strength within normal limits. Stance is stable and normal.  Tandem gait is deferred, patient walks with propulsive posture, wider base and shuffling.   Deep tendon reflexes: in the  upper and lower extremities are symmetric and intact. Babinski down going.    Assessment:  After physical and neurologic examination, review of laboratory studies, imaging, neurophysiology testing and pre-existing records, assessment is    The patient was advised of the nature of the diagnosed autoimmune  disorder -MG , the treatment options and risks for general a health and wellness arising from not treating the condition. Visit duration was 55 minutes.  This patient has MRDD, learning disabilities, never lived independent.  All medical relevant information was obtained w form mother and sister.  She has a very low  muscle tone, no grip strength, sleeps best in a recliner or on a large wedging pillow. She was not a floppy baby, 6 pounds birth weight,  normal suck at birth.   Plan:  Treatment plan and additional workup :  Continue with MTX, for now, Order MTX level, folic acid. Refill on Mestinon. I encourage tid regimen.  I will order a sleep study.  CMET, CBC and differential.    [name] MD  03/20/2014

## 2014-03-28 ENCOUNTER — Encounter: Payer: Self-pay | Admitting: Neurology

## 2014-03-30 ENCOUNTER — Telehealth: Payer: Self-pay | Admitting: *Deleted

## 2014-04-05 ENCOUNTER — Other Ambulatory Visit: Payer: Self-pay | Admitting: Neurology

## 2014-04-05 ENCOUNTER — Other Ambulatory Visit (INDEPENDENT_AMBULATORY_CARE_PROVIDER_SITE_OTHER): Payer: Medicare Other

## 2014-04-05 ENCOUNTER — Other Ambulatory Visit: Payer: Self-pay | Admitting: Internal Medicine

## 2014-04-05 DIAGNOSIS — E039 Hypothyroidism, unspecified: Secondary | ICD-10-CM

## 2014-04-05 DIAGNOSIS — G4733 Obstructive sleep apnea (adult) (pediatric): Secondary | ICD-10-CM

## 2014-04-05 DIAGNOSIS — G7000 Myasthenia gravis without (acute) exacerbation: Secondary | ICD-10-CM

## 2014-04-05 DIAGNOSIS — R739 Hyperglycemia, unspecified: Secondary | ICD-10-CM

## 2014-04-05 DIAGNOSIS — I10 Essential (primary) hypertension: Secondary | ICD-10-CM

## 2014-04-05 DIAGNOSIS — E7800 Pure hypercholesterolemia, unspecified: Secondary | ICD-10-CM

## 2014-04-05 DIAGNOSIS — G40909 Epilepsy, unspecified, not intractable, without status epilepticus: Secondary | ICD-10-CM

## 2014-04-05 LAB — TSH: TSH: 3.36 u[IU]/mL (ref 0.35–4.50)

## 2014-04-05 NOTE — Progress Notes (Signed)
Order placed for f/u labs.  

## 2014-04-05 NOTE — Telephone Encounter (Signed)
Patient's sister [name] called back and scheduled her sister's sleep study for Wednesday March 3rd at 8:00 pm

## 2014-04-06 ENCOUNTER — Encounter: Payer: Self-pay | Admitting: *Deleted

## 2014-04-06 LAB — COMPREHENSIVE METABOLIC PANEL
ALT: 26 IU/L (ref 0–32)
AST: 24 IU/L (ref 0–40)
Albumin/Globulin Ratio: 2.5 (ref 1.1–2.5)
Albumin: 4 g/dL (ref 3.6–4.8)
Alkaline Phosphatase: 91 IU/L (ref 39–117)
BILIRUBIN TOTAL: 0.6 mg/dL (ref 0.0–1.2)
BUN / CREAT RATIO: 13 (ref 11–26)
BUN: 11 mg/dL (ref 8–27)
CHLORIDE: 104 mmol/L (ref 97–108)
CO2: 23 mmol/L (ref 18–29)
CREATININE: 0.86 mg/dL (ref 0.57–1.00)
Calcium: 8.9 mg/dL (ref 8.7–10.3)
GFR calc Af Amer: 84 mL/min/{1.73_m2} (ref >59)
GFR, EST NON AFRICAN AMERICAN: 73 mL/min/{1.73_m2} (ref >59)
GLOBULIN, TOTAL: 1.6 g/dL (ref 1.5–4.5)
Glucose: 123 mg/dL — ABNORMAL HIGH (ref 65–99)
Potassium: 3.8 mmol/L (ref 3.5–5.2)
Sodium: 144 mmol/L (ref 134–144)
TOTAL PROTEIN: 5.6 g/dL — AB (ref 6.0–8.5)

## 2014-04-06 LAB — CBC WITH DIFFERENTIAL/PLATELET
Basophils Absolute: 0 10*3/uL (ref 0.0–0.2)
Basos: 0 %
Eos: 2 %
Eosinophils Absolute: 0.2 10*3/uL (ref 0.0–0.4)
HCT: 43.6 % (ref 34.0–46.6)
Hemoglobin: 14.7 g/dL (ref 11.1–15.9)
Immature Grans (Abs): 0 10*3/uL (ref 0.0–0.1)
Immature Granulocytes: 0 %
Lymphocytes Absolute: 2 10*3/uL (ref 0.7–3.1)
Lymphs: 23 %
MCH: 32.4 pg (ref 26.6–33.0)
MCHC: 33.7 g/dL (ref 31.5–35.7)
MCV: 96 fL (ref 79–97)
Monocytes Absolute: 0.6 10*3/uL (ref 0.1–0.9)
Monocytes: 6 %
Neutrophils Absolute: 6.1 10*3/uL (ref 1.4–7.0)
Neutrophils Relative %: 69 %
PLATELETS: 224 10*3/uL (ref 150–379)
RBC: 4.54 x10E6/uL (ref 3.77–5.28)
RDW: 15.8 % — AB (ref 12.3–15.4)
WBC: 9 10*3/uL (ref 3.4–10.8)

## 2014-04-10 DIAGNOSIS — E662 Morbid (severe) obesity with alveolar hypoventilation: Secondary | ICD-10-CM | POA: Insufficient documentation

## 2014-04-10 DIAGNOSIS — G4710 Hypersomnia, unspecified: Secondary | ICD-10-CM | POA: Insufficient documentation

## 2014-04-10 DIAGNOSIS — G4730 Sleep apnea, unspecified: Secondary | ICD-10-CM

## 2014-04-10 DIAGNOSIS — G7001 Myasthenia gravis with (acute) exacerbation: Secondary | ICD-10-CM | POA: Insufficient documentation

## 2014-04-10 NOTE — Progress Notes (Signed)
Quick Note:  Called and spoke to sister on Hip pa Relayed normal labs. Sister understood. ______

## 2014-04-19 ENCOUNTER — Ambulatory Visit (INDEPENDENT_AMBULATORY_CARE_PROVIDER_SITE_OTHER): Payer: Medicare Other | Admitting: Neurology

## 2014-04-19 DIAGNOSIS — G4733 Obstructive sleep apnea (adult) (pediatric): Secondary | ICD-10-CM | POA: Diagnosis not present

## 2014-04-20 NOTE — Sleep Study (Signed)
Please see the Scanned Sleep Procedure interpretation located in the procedure tab within Chart Review

## 2014-04-25 ENCOUNTER — Other Ambulatory Visit: Payer: Self-pay | Admitting: Internal Medicine

## 2014-04-26 DIAGNOSIS — G4710 Hypersomnia, unspecified: Secondary | ICD-10-CM | POA: Diagnosis not present

## 2014-04-26 DIAGNOSIS — G4730 Sleep apnea, unspecified: Secondary | ICD-10-CM | POA: Diagnosis not present

## 2014-04-26 DIAGNOSIS — E780 Pure hypercholesterolemia: Secondary | ICD-10-CM | POA: Diagnosis not present

## 2014-04-26 DIAGNOSIS — I10 Essential (primary) hypertension: Secondary | ICD-10-CM | POA: Diagnosis not present

## 2014-05-03 ENCOUNTER — Encounter: Payer: Self-pay | Admitting: *Deleted

## 2014-05-03 ENCOUNTER — Encounter: Payer: Self-pay | Admitting: Neurology

## 2014-05-03 ENCOUNTER — Other Ambulatory Visit: Payer: Self-pay | Admitting: Neurology

## 2014-05-03 ENCOUNTER — Telehealth: Payer: Self-pay | Admitting: *Deleted

## 2014-05-03 DIAGNOSIS — G4733 Obstructive sleep apnea (adult) (pediatric): Secondary | ICD-10-CM

## 2014-05-03 NOTE — Telephone Encounter (Signed)
The patient's sister Carlita Whitcomb was contacted and provided the the results of the study.  She was informed that CPAP therapy was recommended for home use and was referred to [facility] for [facility] set up.  Dr. Einar Pheasant was routed a copy of the report.   Patient instructed to contact our office 6-8 weeks post set up to schedule a follow up appointment.  The patient gave verbal permission to mail a copy of her test results.

## 2014-06-01 ENCOUNTER — Other Ambulatory Visit (INDEPENDENT_AMBULATORY_CARE_PROVIDER_SITE_OTHER): Payer: Medicare Other

## 2014-06-01 DIAGNOSIS — I10 Essential (primary) hypertension: Secondary | ICD-10-CM

## 2014-06-01 DIAGNOSIS — E7800 Pure hypercholesterolemia, unspecified: Secondary | ICD-10-CM

## 2014-06-01 DIAGNOSIS — R739 Hyperglycemia, unspecified: Secondary | ICD-10-CM

## 2014-06-01 DIAGNOSIS — E039 Hypothyroidism, unspecified: Secondary | ICD-10-CM | POA: Diagnosis not present

## 2014-06-01 DIAGNOSIS — G7000 Myasthenia gravis without (acute) exacerbation: Secondary | ICD-10-CM

## 2014-06-01 LAB — CBC WITH DIFFERENTIAL/PLATELET
BASOS PCT: 0.4 % (ref 0.0–3.0)
Basophils Absolute: 0 10*3/uL (ref 0.0–0.1)
EOS PCT: 1.9 % (ref 0.0–5.0)
Eosinophils Absolute: 0.2 10*3/uL (ref 0.0–0.7)
HCT: 45.2 % (ref 36.0–46.0)
Hemoglobin: 15.2 g/dL — ABNORMAL HIGH (ref 12.0–15.0)
LYMPHS PCT: 17.1 % (ref 12.0–46.0)
Lymphs Abs: 1.7 10*3/uL (ref 0.7–4.0)
MCHC: 33.6 g/dL (ref 30.0–36.0)
MCV: 96.2 fl (ref 78.0–100.0)
MONOS PCT: 6 % (ref 3.0–12.0)
Monocytes Absolute: 0.6 10*3/uL (ref 0.1–1.0)
Neutro Abs: 7.5 10*3/uL (ref 1.4–7.7)
Neutrophils Relative %: 74.6 % (ref 43.0–77.0)
Platelets: 230 10*3/uL (ref 150.0–400.0)
RBC: 4.7 Mil/uL (ref 3.87–5.11)
RDW: 15.7 % — ABNORMAL HIGH (ref 11.5–15.5)
WBC: 10.1 10*3/uL (ref 4.0–10.5)

## 2014-06-01 LAB — HEPATIC FUNCTION PANEL
ALT: 27 U/L (ref 0–35)
AST: 28 U/L (ref 0–37)
Albumin: 3.8 g/dL (ref 3.5–5.2)
Alkaline Phosphatase: 78 U/L (ref 39–117)
Bilirubin, Direct: 0.1 mg/dL (ref 0.0–0.3)
TOTAL PROTEIN: 6.4 g/dL (ref 6.0–8.3)
Total Bilirubin: 0.6 mg/dL (ref 0.2–1.2)

## 2014-06-01 LAB — LIPID PANEL
CHOL/HDL RATIO: 5
Cholesterol: 194 mg/dL (ref 0–200)
HDL: 38.5 mg/dL — ABNORMAL LOW (ref >39.00)
LDL Cholesterol: 118 mg/dL — ABNORMAL HIGH (ref 0–99)
NonHDL: 155.5
TRIGLYCERIDES: 189 mg/dL — AB (ref 0.0–149.0)
VLDL: 37.8 mg/dL (ref 0.0–40.0)

## 2014-06-01 LAB — BASIC METABOLIC PANEL
BUN: 10 mg/dL (ref 6–23)
CO2: 30 mEq/L (ref 19–32)
Calcium: 9.1 mg/dL (ref 8.4–10.5)
Chloride: 104 mEq/L (ref 96–112)
Creatinine, Ser: 0.94 mg/dL (ref 0.40–1.20)
GFR: 63.93 mL/min (ref >60.00)
Glucose, Bld: 102 mg/dL — ABNORMAL HIGH (ref 70–99)
Potassium: 3.8 mEq/L (ref 3.5–5.1)
Sodium: 138 mEq/L (ref 135–145)

## 2014-06-01 LAB — TSH: TSH: 3.85 u[IU]/mL (ref 0.35–4.50)

## 2014-06-01 LAB — HEMOGLOBIN A1C: HEMOGLOBIN A1C: 5.5 % (ref 4.6–6.5)

## 2014-06-05 ENCOUNTER — Encounter: Payer: Self-pay | Admitting: Internal Medicine

## 2014-06-05 ENCOUNTER — Ambulatory Visit (INDEPENDENT_AMBULATORY_CARE_PROVIDER_SITE_OTHER): Payer: Medicare Other | Admitting: Internal Medicine

## 2014-06-05 VITALS — BP 128/88 | HR 83 | Temp 98.2°F | Ht 63.5 in | Wt 222.1 lb

## 2014-06-05 DIAGNOSIS — G7000 Myasthenia gravis without (acute) exacerbation: Secondary | ICD-10-CM

## 2014-06-05 DIAGNOSIS — I10 Essential (primary) hypertension: Secondary | ICD-10-CM

## 2014-06-05 DIAGNOSIS — K589 Irritable bowel syndrome without diarrhea: Secondary | ICD-10-CM

## 2014-06-05 DIAGNOSIS — R739 Hyperglycemia, unspecified: Secondary | ICD-10-CM

## 2014-06-05 DIAGNOSIS — Z1239 Encounter for other screening for malignant neoplasm of breast: Secondary | ICD-10-CM

## 2014-06-05 DIAGNOSIS — E7800 Pure hypercholesterolemia, unspecified: Secondary | ICD-10-CM

## 2014-06-05 DIAGNOSIS — E669 Obesity, unspecified: Secondary | ICD-10-CM

## 2014-06-05 DIAGNOSIS — Z0000 Encounter for general adult medical examination without abnormal findings: Secondary | ICD-10-CM

## 2014-06-05 NOTE — Progress Notes (Signed)
Pre visit review using our clinic review tool, if applicable. No additional management support is needed unless otherwise documented below in the visit note. 

## 2014-06-05 NOTE — Progress Notes (Signed)
Patient ID: Madison Burns, female   DOB: 1950-12-19, 64 y.o.   MRN: [id number]   Subjective:    Patient ID: Madison Burns, female    DOB: [birth year], 64 y.o.   MRN: [id number]  HPI  Patient here for her physical exam.  Accompanied by her sister and her mother.  History obtained from all of them.  Feel she is doing relatively well.  Eating and drinking well.  No nausea or vomiting.  No bowel change.  No sob reported.     Past Medical History  Diagnosis Date  . GERD (gastroesophageal reflux disease)   . Hypertension   . Hypercholesterolemia   . History of seizure disorder   . Myasthenia gravis   . Irritable bowel syndrome     Outpatient Encounter Prescriptions as of 06/05/2014  Medication Sig  . levothyroxine (SYNTHROID, LEVOTHROID) 50 MCG tablet TAKE ONE (1) TABLET EACH DAY  . methotrexate (RHEUMATREX) 2.5 MG tablet Caution:Chemotherapy. 8 tab per week at once.   Protect from light.  Total 20mg  weekly by taking 8 tablets.  [name] omeprazole (PRILOSEC) 20 MG capsule TAKE ONE (1) CAPSULE EACH DAY  . pyridostigmine (MESTINON) 60 MG tablet Take one at waking up in AM  8 , and one at lunch and one at 8 PM>  . risperiDONE (RISPERDAL) 2 MG tablet Take 2 mg by mouth at bedtime.  . tolterodine (DETROL Burns) 4 MG 24 hr capsule TAKE 1 CAPSULE EVERY MORNING AS DIRECTED    Review of Systems  Constitutional: Negative for appetite change and unexpected weight change.  HENT: Negative for congestion and sinus pressure.   Eyes: Negative for pain and visual disturbance.  Respiratory: Negative for cough, chest tightness and shortness of breath.   Cardiovascular: Negative for chest pain, palpitations and leg swelling.  Gastrointestinal: Negative for nausea, vomiting, abdominal pain and diarrhea.  Genitourinary: Negative for dysuria and difficulty urinating.  Musculoskeletal: Negative for back pain and joint swelling.  Skin: Negative for color change and rash.  Neurological: Negative for dizziness,  light-headedness and headaches.  Hematological: Negative for adenopathy. Does not bruise/bleed easily.  Psychiatric/Behavioral: Negative for dysphoric mood.       Objective:    Physical Exam  Constitutional: She is oriented to person, place, and time. She appears well-developed and well-nourished.  HENT:  Nose: Nose normal.  Mouth/Throat: Oropharynx is clear and moist.  Eyes: Right eye exhibits no discharge. Left eye exhibits no discharge. No scleral icterus.  Neck: Neck supple. No thyromegaly present.  Cardiovascular: Normal rate and regular rhythm.   Pulmonary/Chest: Breath sounds normal. No accessory muscle usage. No tachypnea. No respiratory distress. She has no decreased breath sounds. She has no wheezes. She has no rhonchi. Right breast exhibits no inverted nipple, no mass, no nipple discharge and no tenderness (no axillary adenopathy). Left breast exhibits no inverted nipple, no mass, no nipple discharge and no tenderness (no axilarry adenopathy).  Abdominal: Soft. Bowel sounds are normal. There is no tenderness.  Musculoskeletal: She exhibits no edema or tenderness.  Lymphadenopathy:    She has no cervical adenopathy.  Neurological: She is alert and oriented to person, place, and time.  Skin: Skin is warm. No rash noted.  Psychiatric: She has a normal mood and affect. Her behavior is normal.    BP 128/88 mmHg  Pulse 83  Temp(Src) 98.2 F (36.8 C) (Oral)  Ht 5' 3.5" (1.613 m)  Wt 222 lb 2 oz (100.755 kg)  BMI 38.73 kg/m2  SpO2 97% Wt  Readings from Last 3 Encounters:  06/05/14 222 lb 2 oz (100.755 kg)  03/20/14 218 lb (98.884 kg)  03/06/14 219 lb 8 oz (99.565 kg)     Lab Results  Component Value Date   WBC 10.1 06/01/2014   HGB 15.2* 06/01/2014   HCT 45.2 06/01/2014   PLT 230.0 06/01/2014   GLUCOSE 102* 06/01/2014   CHOL 194 06/01/2014   TRIG 189.0* 06/01/2014   HDL 38.50* 06/01/2014   LDLDIRECT 150.1 01/23/2014   LDLCALC 118* 06/01/2014   ALT 27 06/01/2014     AST 28 06/01/2014   NA 138 06/01/2014   K 3.8 06/01/2014   CL 104 06/01/2014   CREATININE 0.94 06/01/2014   BUN 10 06/01/2014   CO2 30 06/01/2014   TSH 3.85 06/01/2014   HGBA1C 5.5 06/01/2014       Assessment & Plan:   Problem List Items Addressed This Visit    Health care maintenance    Physical 06/05/14.  Mammogram 06/01/13 - Birads I.  Schedule f/u mammogram.        Hypercholesterolemia    Low cholesterol diet and exercise.  Follow lipid panel.        Hyperglycemia    Low carb diet and exercise.  a1c improved.  Follow.    Lab Results  Component Value Date   HGBA1C 5.5 06/01/2014        Hypertension - Primary    Blood pressure well controlled.  Follow pressures.  Follow metabolic panel.        Irritable bowel    Doing better.  Follow.        Myasthenia gravis    Seeing neurology.  On MTX and mestinon.  Follow.  Stable.       Obesity (BMI 30-39.9)    Diet and exercise.  Follow.         Other Visit Diagnoses    Breast cancer screening        Relevant Orders    MM DIGITAL SCREENING BILATERAL        Einar Pheasant, MD

## 2014-06-11 NOTE — Op Note (Signed)
PATIENT NAME:  Madison Burns, Madison Burns MR#:  [id number] DATE OF BIRTH:  Aug 29, 1950  DATE OF PROCEDURE:  05/02/2011  PREOPERATIVE DIAGNOSIS: Chronic cholecystitis, cholelithiasis.  POSTOPERATIVE DIAGNOSIS: Chronic cholecystitis, cholelithiasis.    PROCEDURE: Laparoscopic cholecystectomy, cholangiogram.   SURGEON: Loreli Dollar, MD   ANESTHESIA: General.   INDICATIONS: This 64 year old female has a history of epigastric pains, ultrasound findings of gallstones, and surgery was recommended for definitive treatment.   DESCRIPTION OF PROCEDURE: The patient was placed on the operating table in the supine position under general endotracheal anesthesia. The abdomen was prepared with ChloraPrep and draped in a sterile manner.   A short incision was made in the inferior aspect of the umbilicus and carried down to the deep fascia which was grasped with laryngeal hook and elevated. A Veress needle was inserted, aspirated, and irrigated with a saline solution. Next, the peritoneal cavity was inflated with carbon dioxide. The Veress needle was removed. The 10 mm cannula was inserted. The 10 mm 0 degree laparoscope was inserted to view the peritoneal cavity. The liver appeared normal. The stomach appeared normal. There were a few adhesions in the lower abdomen. Next, the patient was placed in the reverse Trendelenburg position. A small incision was made in the epigastrium slightly to the right of the midline to introduce an 11 mm cannula. Two incisions were made in the lateral aspect of the right upper quadrant to introduce two 5-mm cannulas.   The gallbladder was retracted towards the right shoulder. Multiple adhesions were taken down with blunt and sharp dissection. The pouch of Morison was retracted inferiorly and laterally. The porta hepatis was demonstrated. Additional adhesions were taken down. The cystic artery was coursing along the anterior surface of the gallbladder and further dissection was carried out  mobilizing the neck of the gallbladder with incision of the visceral peritoneum. The cystic duct was identified. A critical view of safety was demonstrated. The cystic artery was controlled with double endoclips and divided. This allowed better traction on the cystic duct. Next, an Endoclip was placed across the cystic duct adjacent to the neck of the gallbladder. An incision was made in the cystic duct to introduce a Reddick catheter. Half-strength Conray-60 dye was injected as the cholangiogram was done with fluoroscopy viewing the biliary tree and prompt flow of dye into the duodenum. No retained stones were seen. The Reddick catheter was removed. The cystic duct was doubly ligated with endoclips and divided. The gallbladder was further dissected. Another small branch of the cystic artery was controlled with Endoclip and divided. The gallbladder was further dissected and about two-thirds of the way through this dissection there was some venous bleeding. Pressure was held. Some blood was suctioned. Two clips were placed and subsequently appeared bleeding stopped. Some blood was aspirated. The site was irrigated with heparinized saline solution and further aspirated. Gallbladder was completely separated from the liver and was delivered up through the infraumbilical incision, opened, suctioned, removed, and sent with palpable stone for routine pathology. The right upper quadrant was further inspected. The laparoscope was reinserted at the umbilical site. It appeared that hemostasis was intact. The site where it had bled was treated with Surgiflo and appeared hemostasis was intact. Next, the cannulas were removed. Carbon dioxide was allowed to escape from the peritoneal cavity. Skin incisions were closed with interrupted 5-0 chromic subcuticular sutures, benzoin, and Steri-Strips. Dressings were applied with paper tape. The patient appeared to tolerate the procedure satisfactorily and is now being prepared for  transfer to  the recovery room.  ____________________________ Lenna Sciara. Rochel Brome, MD jws:drc D: 05/02/2011 12:08:00 ET T: 05/02/2011 12:32:30 ET JOB#: [id number]  cc: Loreli Dollar, MD, <Dictator> Loreli Dollar MD ELECTRONICALLY SIGNED 05/05/2011 8:48

## 2014-06-12 ENCOUNTER — Encounter: Payer: Self-pay | Admitting: Internal Medicine

## 2014-06-12 DIAGNOSIS — Z7189 Other specified counseling: Secondary | ICD-10-CM | POA: Insufficient documentation

## 2014-06-12 DIAGNOSIS — Z0000 Encounter for general adult medical examination without abnormal findings: Secondary | ICD-10-CM | POA: Insufficient documentation

## 2014-06-12 NOTE — Assessment & Plan Note (Signed)
Doing better.  Follow.   

## 2014-06-12 NOTE — Assessment & Plan Note (Signed)
Blood pressure well controlled.  Follow pressures.  Follow metabolic panel.

## 2014-06-12 NOTE — Assessment & Plan Note (Signed)
Seeing neurology.  On MTX and mestinon.  Follow.  Stable.

## 2014-06-12 NOTE — Assessment & Plan Note (Signed)
Low carb diet and exercise.  a1c improved.  Follow.    Lab Results  Component Value Date   HGBA1C 5.5 06/01/2014

## 2014-06-12 NOTE — Assessment & Plan Note (Signed)
Physical 06/05/14.  Mammogram 06/01/13 - Birads I.  Schedule f/u mammogram.

## 2014-06-12 NOTE — Assessment & Plan Note (Signed)
Diet and exercise.  Follow.  

## 2014-06-12 NOTE — Assessment & Plan Note (Signed)
Low cholesterol diet and exercise.  Follow lipid panel.   

## 2014-06-20 ENCOUNTER — Encounter: Payer: Self-pay | Admitting: Podiatry

## 2014-06-20 ENCOUNTER — Ambulatory Visit (INDEPENDENT_AMBULATORY_CARE_PROVIDER_SITE_OTHER): Payer: Medicare Other | Admitting: Podiatry

## 2014-06-20 VITALS — BP 126/86 | HR 80 | Resp 16 | Ht 63.5 in | Wt 220.0 lb

## 2014-06-20 DIAGNOSIS — B351 Tinea unguium: Secondary | ICD-10-CM

## 2014-06-20 DIAGNOSIS — M79676 Pain in unspecified toe(s): Secondary | ICD-10-CM | POA: Diagnosis not present

## 2014-06-21 DIAGNOSIS — F71 Moderate intellectual disabilities: Secondary | ICD-10-CM | POA: Diagnosis not present

## 2014-06-21 DIAGNOSIS — F419 Anxiety disorder, unspecified: Secondary | ICD-10-CM | POA: Diagnosis not present

## 2014-06-21 NOTE — Progress Notes (Signed)
Subjective:     Patient ID: Madison Burns, female   DOB: 1950/07/18, 64 y.o.   MRN: [id number]  HPI 64 year old female presents the office today with complaints of right big toe nail pain. She presents the office they with her caregiver as well as states the nails become significantly thick, discolored and there is pain to the toenail particular with pressure in certain shoes. Remaining nails are also painful and elongated. Denies any redness or drainage from the nail sites. Due to the thickness they are unable to trim the toenails himself. Denies any history of injury or trauma. No other complaints at this time. Review of Systems  All other systems reviewed and are negative.      Objective:   Physical Exam Awake, alert, NAD DP/PT pulses palpable, CRT less than 3 seconds Protective sensation appears to be intact with [place] lining monofilament, Achilles tendon reflex intact. Right hallux nail significant hypertrophic, dystrophic, brittle, discolored, elongated. The remaining nails are also dystrophic, discolored and elongated. There is objective tenderness along nails 1-5 bilaterally. There is no swelling erythema or drainage from the nail sites. No open lesions or pre-ulcerative lesions. No other areas of tenderness to bilateral lower extremities. No overlying edema, erythema, increase in warmth. No pain with calf compression, swelling, warmth, erythema.    Assessment:     64 year old female with symptomatic onychomycosis    Plan:     -Treatment options were discussed including alternatives, risks, complications -Nail sharply debrided 10 without complication/bleeding -Discussed importance daily foot inspection. -Follow-up in 3 months or sooner if any problems are to arise. In the meantime call the office they questions, concerns, change in symptoms.

## 2014-07-10 ENCOUNTER — Other Ambulatory Visit: Payer: Self-pay | Admitting: Internal Medicine

## 2014-08-09 ENCOUNTER — Other Ambulatory Visit: Payer: Self-pay | Admitting: Internal Medicine

## 2014-08-18 ENCOUNTER — Telehealth: Payer: Self-pay

## 2014-08-18 NOTE — Telephone Encounter (Signed)
The patient's sister called and stated she believes the patient has UTI.  She is hoping something can be called in rather than having her come in for an apt.  Callback - [phone]

## 2014-09-21 ENCOUNTER — Ambulatory Visit (INDEPENDENT_AMBULATORY_CARE_PROVIDER_SITE_OTHER): Payer: Medicare Other | Admitting: Podiatry

## 2014-09-21 DIAGNOSIS — B351 Tinea unguium: Secondary | ICD-10-CM

## 2014-09-21 DIAGNOSIS — M79676 Pain in unspecified toe(s): Secondary | ICD-10-CM | POA: Diagnosis not present

## 2014-09-21 NOTE — Progress Notes (Signed)
Patient ID: Madison Burns, female   DOB: 09/20/1950, 64 y.o.   MRN: [id number]  Subjective: 64 y.o. returns the office today for painful, elongated, thickened toenails which she is unable to trim herself. Denies any redness or drainage around the nails. She has not had as much pain to the left big toenail recently (except for when it grows out).  Denies any acute changes since last appointment and no new complaints today. Denies any systemic complaints such as fevers, chills, nausea, vomiting.   Objective: AAO 3, NAD DP/PT pulses palpable, CRT less than 3 seconds Nails hypertrophic, dystrophic, elongated, brittle, discolored 10. There is tenderness overlying the nails 15 bilaterally. There is no surrounding erythema or drainage along the nail sites. No open lesions or pre-ulcerative lesions are identified. No other areas of tenderness bilateral lower extremities. No overlying edema, erythema, increased warmth. No pain with calf compression, swelling, warmth, erythema.  Assessment: Patient presents with symptomatic onychomycosis  Plan: -Treatment options including alternatives, risks, complications were discussed -Nails sharply debrided 10 without complication/bleeding. -Discussed daily foot inspection. If there are any changes, to call the office immediately.  -Follow-up in 3 months or sooner if any problems are to arise. In the meantime, encouraged to call the office with any questions, concerns, changes symptoms.   Celesta Gentile, DPM

## 2014-10-02 ENCOUNTER — Telehealth: Payer: Self-pay | Admitting: *Deleted

## 2014-10-02 DIAGNOSIS — R739 Hyperglycemia, unspecified: Secondary | ICD-10-CM

## 2014-10-02 DIAGNOSIS — E7800 Pure hypercholesterolemia, unspecified: Secondary | ICD-10-CM

## 2014-10-02 DIAGNOSIS — I10 Essential (primary) hypertension: Secondary | ICD-10-CM

## 2014-10-02 DIAGNOSIS — G40909 Epilepsy, unspecified, not intractable, without status epilepticus: Secondary | ICD-10-CM

## 2014-10-02 NOTE — Telephone Encounter (Signed)
Pt coming tomorrow labs and dx?

## 2014-10-02 NOTE — Telephone Encounter (Signed)
Order placed for labs.

## 2014-10-03 ENCOUNTER — Other Ambulatory Visit: Payer: Self-pay | Admitting: Internal Medicine

## 2014-10-03 ENCOUNTER — Other Ambulatory Visit (INDEPENDENT_AMBULATORY_CARE_PROVIDER_SITE_OTHER): Payer: Medicare Other

## 2014-10-03 DIAGNOSIS — I10 Essential (primary) hypertension: Secondary | ICD-10-CM | POA: Diagnosis not present

## 2014-10-03 DIAGNOSIS — R300 Dysuria: Secondary | ICD-10-CM | POA: Diagnosis not present

## 2014-10-03 DIAGNOSIS — E7800 Pure hypercholesterolemia, unspecified: Secondary | ICD-10-CM

## 2014-10-03 DIAGNOSIS — R739 Hyperglycemia, unspecified: Secondary | ICD-10-CM | POA: Diagnosis not present

## 2014-10-03 LAB — HEPATIC FUNCTION PANEL
ALBUMIN: 3.8 g/dL (ref 3.5–5.2)
ALT: 26 U/L (ref 0–35)
AST: 31 U/L (ref 0–37)
Alkaline Phosphatase: 69 U/L (ref 39–117)
BILIRUBIN TOTAL: 0.7 mg/dL (ref 0.2–1.2)
Bilirubin, Direct: 0.1 mg/dL (ref 0.0–0.3)
Total Protein: 6.3 g/dL (ref 6.0–8.3)

## 2014-10-03 LAB — URINALYSIS, ROUTINE W REFLEX MICROSCOPIC
Bilirubin Urine: NEGATIVE
Ketones, ur: NEGATIVE
Nitrite: POSITIVE — AB
SPECIFIC GRAVITY, URINE: 1.02 (ref 1.000–1.030)
Total Protein, Urine: NEGATIVE
URINE GLUCOSE: NEGATIVE
UROBILINOGEN UA: 0.2 (ref 0.0–1.0)
pH: 6 (ref 5.0–8.0)

## 2014-10-03 LAB — BASIC METABOLIC PANEL
BUN: 11 mg/dL (ref 6–23)
CALCIUM: 8.9 mg/dL (ref 8.4–10.5)
CO2: 28 mEq/L (ref 19–32)
CREATININE: 0.92 mg/dL (ref 0.40–1.20)
Chloride: 106 mEq/L (ref 96–112)
GFR: 65.46 mL/min (ref >60.00)
GLUCOSE: 117 mg/dL — AB (ref 70–99)
Potassium: 3.6 mEq/L (ref 3.5–5.1)
SODIUM: 141 meq/L (ref 135–145)

## 2014-10-03 LAB — LIPID PANEL
CHOLESTEROL: 194 mg/dL (ref 0–200)
HDL: 34.7 mg/dL — AB (ref >39.00)
LDL Cholesterol: 125 mg/dL — ABNORMAL HIGH (ref 0–99)
NonHDL: 159.16
Total CHOL/HDL Ratio: 6
Triglycerides: 173 mg/dL — ABNORMAL HIGH (ref 0.0–149.0)
VLDL: 34.6 mg/dL (ref 0.0–40.0)

## 2014-10-03 LAB — TSH: TSH: 2.88 u[IU]/mL (ref 0.35–4.50)

## 2014-10-03 LAB — HEMOGLOBIN A1C: Hgb A1c MFr Bld: 5.4 % (ref 4.6–6.5)

## 2014-10-03 NOTE — Addendum Note (Signed)
Addended by: Karlene Einstein D on: 10/03/2014 10:19 AM   Modules accepted: Orders

## 2014-10-03 NOTE — Progress Notes (Signed)
Order placed for urine and culture °

## 2014-10-05 ENCOUNTER — Encounter: Payer: Self-pay | Admitting: Internal Medicine

## 2014-10-05 ENCOUNTER — Ambulatory Visit (INDEPENDENT_AMBULATORY_CARE_PROVIDER_SITE_OTHER): Payer: Medicare Other | Admitting: Internal Medicine

## 2014-10-05 VITALS — BP 120/82 | HR 78 | Temp 98.3°F | Ht 63.5 in | Wt 219.0 lb

## 2014-10-05 DIAGNOSIS — E7800 Pure hypercholesterolemia, unspecified: Secondary | ICD-10-CM

## 2014-10-05 DIAGNOSIS — C439 Malignant melanoma of skin, unspecified: Secondary | ICD-10-CM

## 2014-10-05 DIAGNOSIS — G7001 Myasthenia gravis with (acute) exacerbation: Secondary | ICD-10-CM

## 2014-10-05 DIAGNOSIS — K589 Irritable bowel syndrome without diarrhea: Secondary | ICD-10-CM | POA: Diagnosis not present

## 2014-10-05 DIAGNOSIS — I10 Essential (primary) hypertension: Secondary | ICD-10-CM | POA: Diagnosis not present

## 2014-10-05 DIAGNOSIS — G40909 Epilepsy, unspecified, not intractable, without status epilepticus: Secondary | ICD-10-CM | POA: Diagnosis not present

## 2014-10-05 DIAGNOSIS — R739 Hyperglycemia, unspecified: Secondary | ICD-10-CM

## 2014-10-05 DIAGNOSIS — E669 Obesity, unspecified: Secondary | ICD-10-CM

## 2014-10-05 NOTE — Progress Notes (Signed)
Patient ID: Madison Burns, female   DOB: 16-Dec-1950, 64 y.o.   MRN: [id number]   Subjective:    Patient ID: Madison Burns, female    DOB: September 30, 1950, 64 y.o.   MRN: [id number]  HPI  Patient here for a scheduled follow up.  She is accompanied by her sister.  History obtained from both of them.  Trying to eat better.  Adjusted diet some.  Has lost some weight.  Not exercising regularly.  Sister plans to get her more active.  No cardiac symptoms with increased activity or exertion.  No sob.  No abdominal pain reported.  Bowels stable.     Past Medical History  Diagnosis Date  . GERD (gastroesophageal reflux disease)   . Hypertension   . Hypercholesterolemia   . History of seizure disorder   . Myasthenia gravis   . Irritable bowel syndrome    Past Surgical History  Procedure Laterality Date  . Cholecystectomy  2013  . Appendectomy  1999  . Abdominal hysterectomy    . Tonsillectomy     Family History  Problem Relation Age of Onset  . CVA Maternal Grandfather   . Heart disease Father     myocardial infarction  . Hyperlipidemia Father   . Hyperlipidemia Sister   . Colon cancer Maternal Grandfather     stomach/colon  . Bone cancer      cousin  . Alzheimer's disease Mother     maternal aunts   Social History   Social History  . Marital Status: Single    Spouse Name: N/A  . Number of Children: 0  . Years of Education: Special Ed   Occupational History  . Unemployed    Social History Main Topics  . Smoking status: Never Smoker   . Smokeless tobacco: Never Used  . Alcohol Use: No  . Drug Use: No  . Sexual Activity: Not Asked   Other Topics Concern  . None   Social History Narrative   Regular exercise-no   Caffeine Use-yes    Outpatient Encounter Prescriptions as of 10/05/2014  Medication Sig  . levothyroxine (SYNTHROID, LEVOTHROID) 50 MCG tablet TAKE ONE (1) TABLET EACH DAY  . methotrexate (RHEUMATREX) 2.5 MG tablet Caution:Chemotherapy. 8 tab per week at  once.   Protect from light.  Total 37m weekly by taking 8 tablets.  .Marland Kitchenomeprazole (PRILOSEC) 20 MG capsule TAKE ONE CAPSULE BY MOUTH DAILY  . pyridostigmine (MESTINON) 60 MG tablet Take one at waking up in AM  8 , and one at lunch and one at 8 PM>  . risperiDONE (RISPERDAL) 2 MG tablet Take 2 mg by mouth at bedtime.  . tolterodine (DETROL Burns) 4 MG 24 hr capsule TAKE ONE CAPSULE BY MOUTH EACH MORNING AS DIRECTED   No facility-administered encounter medications on file as of 10/05/2014.    Review of Systems  Constitutional: Negative for appetite change and unexpected weight change.  HENT: Negative for congestion and sinus pressure.   Eyes: Negative for pain and discharge.  Respiratory: Negative for cough, chest tightness and shortness of breath.   Cardiovascular: Negative for chest pain, palpitations and leg swelling.  Gastrointestinal: Negative for nausea, vomiting, abdominal pain and diarrhea.  Genitourinary: Negative for dysuria and difficulty urinating.  Musculoskeletal: Negative for back pain and joint swelling.  Skin: Negative for color change and rash.  Neurological: Negative for dizziness, light-headedness and headaches.  Hematological: Negative for adenopathy. Does not bruise/bleed easily.  Psychiatric/Behavioral: Negative for dysphoric mood and agitation.  Objective:    Physical Exam  Constitutional: She appears well-developed and well-nourished. No distress.  HENT:  Nose: Nose normal.  Mouth/Throat: Oropharynx is clear and moist.  Eyes: Conjunctivae are normal. Right eye exhibits no discharge. Left eye exhibits no discharge.  Neck: Neck supple. No thyromegaly present.  Cardiovascular: Normal rate and regular rhythm.   Pulmonary/Chest: Breath sounds normal. No respiratory distress. She has no wheezes.  Abdominal: Soft. Bowel sounds are normal. There is no tenderness.  Musculoskeletal: She exhibits no edema or tenderness.  Lymphadenopathy:    She has no cervical  adenopathy.  Skin: No rash noted. No erythema.  Psychiatric: She has a normal mood and affect. Her behavior is normal.    BP 120/82 mmHg  Pulse 78  Temp(Src) 98.3 F (36.8 C) (Oral)  Ht 5' 3.5" (1.613 m)  Wt 219 lb (99.338 kg)  BMI 38.18 kg/m2  SpO2 95% Wt Readings from Last 3 Encounters:  10/05/14 219 lb (99.338 kg)  06/20/14 220 lb (99.791 kg)  06/05/14 222 lb 2 oz (100.755 kg)     Lab Results  Component Value Date   WBC 10.1 06/01/2014   HGB 15.2* 06/01/2014   HCT 45.2 06/01/2014   PLT 230.0 06/01/2014   GLUCOSE 117* 10/03/2014   CHOL 194 10/03/2014   TRIG 173.0* 10/03/2014   HDL 34.70* 10/03/2014   LDLDIRECT 150.1 01/23/2014   LDLCALC 125* 10/03/2014   ALT 26 10/03/2014   AST 31 10/03/2014   NA 141 10/03/2014   K 3.6 10/03/2014   CL 106 10/03/2014   CREATININE 0.92 10/03/2014   BUN 11 10/03/2014   CO2 28 10/03/2014   TSH 2.88 10/03/2014   HGBA1C 5.4 10/03/2014       Assessment & Plan:   Problem List Items Addressed This Visit    Hypercholesterolemia    Low cholesterol diet and exercise.  Follow lipid panel.   Lab Results  Component Value Date   CHOL 194 10/03/2014   HDL 34.70* 10/03/2014   LDLCALC 125* 10/03/2014   LDLDIRECT 150.1 01/23/2014   TRIG 173.0* 10/03/2014   CHOLHDL 6 10/03/2014        Relevant Orders   Lipid panel   Hyperglycemia    Low carb diet and exercise.  Sugar has been dong well.  Follow met b and a1c.   Lab Results  Component Value Date   HGBA1C 5.4 10/03/2014        Relevant Orders   Hemoglobin A1c   Hypertension - Primary    Blood pressure under good control.  Continue same medication regimen.  Follow pressures.  Follow metabolic panel.        Relevant Orders   Basic metabolic panel   Irritable bowel    Bowels stable.  Follow.        Melanoma    Has been followed by dermatology.       Relevant Orders   Hepatic function panel   MG with exacerbation (myasthenia gravis)    Followed by neurology. On  mestinon.        Obesity (BMI 30-39.9)    Diet and exercise.  Follow.        Seizure disorder    Followed by Dr Melrose Nakayama.            Einar Pheasant, MD

## 2014-10-05 NOTE — Progress Notes (Signed)
Pre visit review using our clinic review tool, if applicable. No additional management support is needed unless otherwise documented below in the visit note. 

## 2014-10-06 LAB — CULTURE, URINE COMPREHENSIVE

## 2014-10-10 ENCOUNTER — Telehealth: Payer: Self-pay | Admitting: *Deleted

## 2014-10-10 ENCOUNTER — Encounter: Payer: Self-pay | Admitting: Internal Medicine

## 2014-10-10 MED ORDER — METHOTREXATE 2.5 MG PO TABS: ORAL_TABLET | ORAL | Status: DC

## 2014-10-10 NOTE — Assessment & Plan Note (Signed)
Followed by Dr. Potter.  

## 2014-10-10 NOTE — Telephone Encounter (Signed)
Changed refill to 32. CD

## 2014-10-10 NOTE — Assessment & Plan Note (Signed)
Blood pressure under good control.  Continue same medication regimen.  Follow pressures.  Follow metabolic panel.   

## 2014-10-10 NOTE — Assessment & Plan Note (Signed)
Low carb diet and exercise.  Sugar has been dong well.  Follow met b and a1c.   Lab Results  Component Value Date   HGBA1C 5.4 10/03/2014

## 2014-10-10 NOTE — Assessment & Plan Note (Signed)
Has been followed by dermatology.  

## 2014-10-10 NOTE — Assessment & Plan Note (Signed)
Bowels stable.  Follow.

## 2014-10-10 NOTE — Assessment & Plan Note (Signed)
Diet and exercise.  Follow.  

## 2014-10-10 NOTE — Assessment & Plan Note (Signed)
Low cholesterol diet and exercise.  Follow lipid panel.   Lab Results  Component Value Date   CHOL 194 10/03/2014   HDL 34.70* 10/03/2014   LDLCALC 125* 10/03/2014   LDLDIRECT 150.1 01/23/2014   TRIG 173.0* 10/03/2014   CHOLHDL 6 10/03/2014

## 2014-10-10 NOTE — Assessment & Plan Note (Signed)
Followed by neurology. On mestinon.

## 2014-10-11 NOTE — Telephone Encounter (Signed)
RX is sent to pharmacy  

## 2014-10-16 ENCOUNTER — Telehealth: Payer: Self-pay | Admitting: *Deleted

## 2014-10-16 NOTE — Telephone Encounter (Signed)
Left message on VM to return call 

## 2014-10-16 NOTE — Telephone Encounter (Signed)
Patient has concerns about a antibiotic being refilled for a UTi-Thanks

## 2014-10-17 ENCOUNTER — Other Ambulatory Visit: Payer: Self-pay | Admitting: *Deleted

## 2014-10-17 MED ORDER — NITROFURANTOIN MONOHYD MACRO 100 MG PO CAPS: 100.0000 mg | ORAL_CAPSULE | Freq: Two times a day (BID) | ORAL | Status: DC

## 2014-10-17 NOTE — Telephone Encounter (Signed)
Spoke with pts sister [name].  Augustina Mood Macrobid Rx sent in to pharmacy

## 2014-10-19 DIAGNOSIS — F71 Moderate intellectual disabilities: Secondary | ICD-10-CM | POA: Diagnosis not present

## 2014-10-19 DIAGNOSIS — F419 Anxiety disorder, unspecified: Secondary | ICD-10-CM | POA: Diagnosis not present

## 2014-11-21 ENCOUNTER — Other Ambulatory Visit: Payer: Self-pay | Admitting: Internal Medicine

## 2014-11-21 DIAGNOSIS — L821 Other seborrheic keratosis: Secondary | ICD-10-CM | POA: Diagnosis not present

## 2014-11-21 DIAGNOSIS — D492 Neoplasm of unspecified behavior of bone, soft tissue, and skin: Secondary | ICD-10-CM | POA: Diagnosis not present

## 2014-11-21 DIAGNOSIS — D239 Other benign neoplasm of skin, unspecified: Secondary | ICD-10-CM | POA: Diagnosis not present

## 2014-11-21 DIAGNOSIS — L280 Lichen simplex chronicus: Secondary | ICD-10-CM | POA: Diagnosis not present

## 2014-11-21 DIAGNOSIS — L570 Actinic keratosis: Secondary | ICD-10-CM | POA: Diagnosis not present

## 2014-11-29 ENCOUNTER — Ambulatory Visit
Admission: RE | Admit: 2014-11-29 | Discharge: 2014-11-29 | Disposition: A | Discharge status: Home / Self Care | Payer: Medicare Other | Source: Ambulatory Visit | Attending: Internal Medicine | Admitting: Internal Medicine

## 2014-11-29 DIAGNOSIS — Z1239 Encounter for other screening for malignant neoplasm of breast: Secondary | ICD-10-CM

## 2014-12-19 ENCOUNTER — Ambulatory Visit: Payer: Medicare Other | Admitting: Podiatry

## 2014-12-21 ENCOUNTER — Ambulatory Visit (INDEPENDENT_AMBULATORY_CARE_PROVIDER_SITE_OTHER): Payer: Medicare Other | Admitting: Podiatry

## 2014-12-21 DIAGNOSIS — B351 Tinea unguium: Secondary | ICD-10-CM | POA: Diagnosis not present

## 2014-12-21 DIAGNOSIS — M79676 Pain in unspecified toe(s): Secondary | ICD-10-CM | POA: Diagnosis not present

## 2014-12-21 NOTE — Progress Notes (Signed)
Patient ID: Madison Burns, female   DOB: Jul 06, 1950, 64 y.o.   MRN: [id number]  Subjective: 63 y.o. returns the office today for painful, elongated, thickened toenails which she is unable to trim herself. Denies any redness or drainage around the nails.Denies any acute changes since last appointment and no new complaints today. Denies any systemic complaints such as fevers, chills, nausea, vomiting.   Objective: AAO 3, NAD DP/PT pulses palpable, CRT less than 3 seconds Nails hypertrophic, dystrophic, elongated, brittle, discolored 10. There is tenderness overlying the nails 1-5 bilaterally. There is no surrounding erythema or drainage along the nail sites. No open lesions or pre-ulcerative lesions are identified. No other areas of tenderness bilateral lower extremities. No overlying edema, erythema, increased warmth. No pain with calf compression, swelling, warmth, erythema.  Assessment: Patient presents with symptomatic onychomycosis  Plan: -Treatment options including alternatives, risks, complications were discussed -Nails sharply debrided 10 without complication/bleeding. -Discussed daily foot inspection. If there are any changes, to call the office immediately.  -Follow-up in 3 months or sooner if any problems are to arise. In the meantime, encouraged to call the office with any questions, concerns, changes symptoms.  Celesta Gentile, DPM

## 2015-01-03 ENCOUNTER — Other Ambulatory Visit: Payer: Self-pay | Admitting: Internal Medicine

## 2015-01-18 DIAGNOSIS — F71 Moderate intellectual disabilities: Secondary | ICD-10-CM | POA: Diagnosis not present

## 2015-01-18 DIAGNOSIS — F419 Anxiety disorder, unspecified: Secondary | ICD-10-CM | POA: Diagnosis not present

## 2015-01-25 ENCOUNTER — Telehealth: Payer: Self-pay | Admitting: Neurology

## 2015-01-25 MED ORDER — PYRIDOSTIGMINE BROMIDE 60 MG PO TABS: ORAL_TABLET | ORAL | Status: DC

## 2015-01-25 NOTE — Telephone Encounter (Signed)
Patient's sister is calling. She states [facility] in [place] sent a request for a  refill for medication lpyridostigmine (MESTINON) 60 MG tablet for the patient last week but there has been no response. The patient is out of this medication. Thank you.

## 2015-01-25 NOTE — Telephone Encounter (Signed)
I called and spoke to Madison Burns.  Pt has not been on cpap.   She stated she never got a call from [facility].  She then stated that it could be that she has an Mudlogger with [facility].  Pt uses oxygen. She stated money was tight.   She asked about another company then stated that she would like to get in touch with [place] in January about this.  In the interim  I refilled the mestinon 60mg  po 0800-12  -2000 # 90 one refill.  She was ok with this.  Needs appt when calls back in January.

## 2015-02-02 ENCOUNTER — Other Ambulatory Visit (INDEPENDENT_AMBULATORY_CARE_PROVIDER_SITE_OTHER): Payer: Medicare Other

## 2015-02-02 DIAGNOSIS — C439 Malignant melanoma of skin, unspecified: Secondary | ICD-10-CM | POA: Diagnosis not present

## 2015-02-02 DIAGNOSIS — E7800 Pure hypercholesterolemia, unspecified: Secondary | ICD-10-CM | POA: Diagnosis not present

## 2015-02-02 DIAGNOSIS — I10 Essential (primary) hypertension: Secondary | ICD-10-CM | POA: Diagnosis not present

## 2015-02-02 DIAGNOSIS — R739 Hyperglycemia, unspecified: Secondary | ICD-10-CM

## 2015-02-02 LAB — BASIC METABOLIC PANEL
BUN: 7 mg/dL (ref 6–23)
CO2: 30 meq/L (ref 19–32)
Calcium: 9 mg/dL (ref 8.4–10.5)
Chloride: 103 mEq/L (ref 96–112)
Creatinine, Ser: 0.91 mg/dL (ref 0.40–1.20)
GFR: 66.22 mL/min (ref >60.00)
GLUCOSE: 114 mg/dL — AB (ref 70–99)
POTASSIUM: 3.9 meq/L (ref 3.5–5.1)
SODIUM: 139 meq/L (ref 135–145)

## 2015-02-02 LAB — HEPATIC FUNCTION PANEL
ALT: 32 U/L (ref 0–35)
AST: 34 U/L (ref 0–37)
Albumin: 4 g/dL (ref 3.5–5.2)
Alkaline Phosphatase: 76 U/L (ref 39–117)
BILIRUBIN TOTAL: 0.7 mg/dL (ref 0.2–1.2)
Bilirubin, Direct: 0.2 mg/dL (ref 0.0–0.3)
Total Protein: 6.3 g/dL (ref 6.0–8.3)

## 2015-02-02 LAB — LIPID PANEL
Cholesterol: 202 mg/dL — ABNORMAL HIGH (ref 0–200)
HDL: 37.7 mg/dL — ABNORMAL LOW (ref >39.00)
LDL CALC: 125 mg/dL — AB (ref 0–99)
NONHDL: 163.87
Total CHOL/HDL Ratio: 5
Triglycerides: 196 mg/dL — ABNORMAL HIGH (ref 0.0–149.0)
VLDL: 39.2 mg/dL (ref 0.0–40.0)

## 2015-02-02 LAB — HEMOGLOBIN A1C: Hgb A1c MFr Bld: 5.4 % (ref 4.6–6.5)

## 2015-02-05 ENCOUNTER — Other Ambulatory Visit: Payer: Self-pay | Admitting: Internal Medicine

## 2015-02-05 ENCOUNTER — Ambulatory Visit: Payer: Medicare Other | Admitting: Internal Medicine

## 2015-02-07 ENCOUNTER — Ambulatory Visit (INDEPENDENT_AMBULATORY_CARE_PROVIDER_SITE_OTHER): Payer: Medicare Other | Admitting: Internal Medicine

## 2015-02-07 ENCOUNTER — Encounter: Payer: Self-pay | Admitting: Internal Medicine

## 2015-02-07 VITALS — BP 120/80 | HR 83 | Temp 98.5°F | Resp 18 | Ht 63.5 in | Wt 218.0 lb

## 2015-02-07 DIAGNOSIS — G7000 Myasthenia gravis without (acute) exacerbation: Secondary | ICD-10-CM | POA: Diagnosis not present

## 2015-02-07 DIAGNOSIS — E669 Obesity, unspecified: Secondary | ICD-10-CM

## 2015-02-07 DIAGNOSIS — C439 Malignant melanoma of skin, unspecified: Secondary | ICD-10-CM

## 2015-02-07 DIAGNOSIS — R300 Dysuria: Secondary | ICD-10-CM

## 2015-02-07 DIAGNOSIS — E7800 Pure hypercholesterolemia, unspecified: Secondary | ICD-10-CM

## 2015-02-07 DIAGNOSIS — R739 Hyperglycemia, unspecified: Secondary | ICD-10-CM

## 2015-02-07 DIAGNOSIS — I10 Essential (primary) hypertension: Secondary | ICD-10-CM

## 2015-02-07 LAB — POCT URINALYSIS DIPSTICK
BILIRUBIN UA: NEGATIVE
Blood, UA: NEGATIVE
Glucose, UA: NEGATIVE
KETONES UA: NEGATIVE
Nitrite, UA: NEGATIVE
Protein, UA: NEGATIVE
Urobilinogen, UA: 0.2
pH, UA: 6.5

## 2015-02-07 NOTE — Progress Notes (Signed)
Patient ID: [name], female   DOB: 10-Apr-1950, 64 y.o.   MRN: [id number]   Subjective:    Patient ID: [name], female    DOB: January 12, 1951, 64 y.o.   MRN: [id number]  HPI  Patient with past history of hypercholesterolemia, GERD, myasthenia gravis, seizure disorder and hypertension.  She comes in today to follow up on these issues.  She is also complaining of some dysuria and tingling with urination.  She is accompanied by her sister.  History obtained from both of them.  Her urinary symptoms started over the last 24-48 hours.  No abdominal pain or cramping.  Bowels stable.  Eating and drinking well.  Discussed diet and exercise.  Not exercising.  They do try to watch what they eat.  No increased cough or congestion.  No sob.  No chest pain or tightness.     Past Medical History  Diagnosis Date  . GERD (gastroesophageal reflux disease)   . Hypertension   . Hypercholesterolemia   . History of seizure disorder   . Myasthenia gravis ([place])   . Irritable bowel syndrome    Past Surgical History  Procedure Laterality Date  . Cholecystectomy  2013  . Appendectomy  1999  . Abdominal hysterectomy    . Tonsillectomy     Family History  Problem Relation Age of Onset  . CVA Maternal Grandfather   . Heart disease Father     myocardial infarction  . Hyperlipidemia Father   . Hyperlipidemia Sister   . Colon cancer Maternal Grandfather     stomach/colon  . Bone cancer      cousin  . Alzheimer's disease Mother     maternal aunts   Social History   Social History  . Marital Status: Single    Spouse Name: N/A  . Number of Children: 0  . Years of Education: Special Ed   Occupational History  . Unemployed    Social History Main Topics  . Smoking status: Never Smoker   . Smokeless tobacco: Never Used  . Alcohol Use: No  . Drug Use: No  . Sexual Activity: Not Asked   Other Topics Concern  . None   Social History Narrative   Regular exercise-no   Caffeine Use-yes     Outpatient Encounter Prescriptions as of 02/07/2015  Medication Sig  . levothyroxine (SYNTHROID, LEVOTHROID) 50 MCG tablet TAKE ONE (1) TABLET BY MOUTH EVERY DAY  . methotrexate (RHEUMATREX) 2.5 MG tablet Caution:Chemotherapy. 8 tab per week at once.   Protect from light.  Total 31m weekly by taking 8 tablets.  .Marland Kitchenomeprazole (PRILOSEC) 20 MG capsule TAKE ONE CAPSULE BY MOUTH DAILY  . pyridostigmine (MESTINON) 60 MG tablet Take one at waking up in AM  8 , and one at lunch and one at 8 PM>  . risperiDONE (RISPERDAL) 2 MG tablet Take 2 mg by mouth at bedtime.  . tolterodine (DETROL LA) 4 MG 24 hr capsule TAKE ONE CAPSULE BY MOUTH EACH MORNING AS DIRECTED  . [DISCONTINUED] nitrofurantoin, macrocrystal-monohydrate, (MACROBID) 100 MG capsule Take 1 capsule (100 mg total) by mouth 2 (two) times daily.   No facility-administered encounter medications on file as of 02/07/2015.    Review of Systems  Constitutional: Negative for appetite change and unexpected weight change.  HENT: Negative for congestion and sinus pressure.   Eyes: Negative for discharge and redness.  Respiratory: Negative for cough, chest tightness and shortness of breath.   Cardiovascular: Negative for chest pain, palpitations and leg  swelling.  Gastrointestinal: Negative for nausea, vomiting, abdominal pain and diarrhea.  Genitourinary: Positive for dysuria. Negative for vaginal discharge.  Musculoskeletal: Negative for back pain and joint swelling.  Skin: Negative for color change and rash.  Neurological: Negative for dizziness, light-headedness and headaches.  Psychiatric/Behavioral: Negative for dysphoric mood and agitation.       Objective:    Physical Exam  Constitutional: She appears well-developed and well-nourished. No distress.  HENT:  Nose: Nose normal.  Mouth/Throat: Oropharynx is clear and moist.  Eyes: Conjunctivae are normal. Right eye exhibits no discharge. Left eye exhibits no discharge.  Neck: Neck  supple. No thyromegaly present.  Cardiovascular: Normal rate and regular rhythm.   Pulmonary/Chest: Breath sounds normal. No respiratory distress. She has no wheezes.  Abdominal: Soft. Bowel sounds are normal. There is no tenderness.  Musculoskeletal: She exhibits no edema or tenderness.  Lymphadenopathy:    She has no cervical adenopathy.  Skin: No rash noted. No erythema.  Psychiatric: She has a normal mood and affect. Her behavior is normal.    BP 120/80 mmHg  Pulse 83  Temp(Src) 98.5 F (36.9 C) (Oral)  Resp 18  Ht 5' 3.5" (1.613 m)  Wt 218 lb (98.884 kg)  BMI 38.01 kg/m2  SpO2 96% Wt Readings from Last 3 Encounters:  02/07/15 218 lb (98.884 kg)  10/05/14 219 lb (99.338 kg)  06/20/14 220 lb (99.791 kg)     Lab Results  Component Value Date   WBC 10.1 06/01/2014   HGB 15.2* 06/01/2014   HCT 45.2 06/01/2014   PLT 230.0 06/01/2014   GLUCOSE 114* 02/02/2015   CHOL 202* 02/02/2015   TRIG 196.0* 02/02/2015   HDL 37.70* 02/02/2015   LDLDIRECT 150.1 01/23/2014   LDLCALC 125* 02/02/2015   ALT 32 02/02/2015   AST 34 02/02/2015   NA 139 02/02/2015   K 3.9 02/02/2015   CL 103 02/02/2015   CREATININE 0.91 02/02/2015   BUN 7 02/02/2015   CO2 30 02/02/2015   TSH 2.88 10/03/2014   HGBA1C 5.4 02/02/2015      Assessment & Plan:   Problem List Items Addressed This Visit    Dysuria - Primary    Check urine dip and culture.  Treat if needed.        Relevant Orders   CULTURE, URINE COMPREHENSIVE (Completed)   POCT Urinalysis Dipstick (Completed)   Hypercholesterolemia    Low cholesterol diet and exercise.  LDL 125.  Discussed cholesterol medication.  Sister declines.  Follow cholesterol panel.        Relevant Orders   Lipid panel   Hyperglycemia    Low carb diet and exercise.  a1c just checked 5.4.  Follow met b and a1c.       Relevant Orders   Hemoglobin A1c   Basic metabolic panel   Hypertension    Blood pressure under good control.  Continue same medication  regimen.  Follow pressures.  Follow metabolic panel.        Melanoma (HCC)    Has been followed by dermatology.        Relevant Orders   CBC with Differential/Platelet   Hepatic function panel   Myasthenia gravis (HCC)    Seeing neurology.  On mestinon and MTX.  Follow.  Stable.        Obesity (BMI 30-39.9)    Discussed diet and exercise.            , , MD  

## 2015-02-07 NOTE — Progress Notes (Signed)
Pre-visit discussion using our clinic review tool. No additional management support is needed unless otherwise documented below in the visit note.  

## 2015-02-08 ENCOUNTER — Other Ambulatory Visit: Payer: Self-pay | Admitting: *Deleted

## 2015-02-08 MED ORDER — AMOXICILLIN-POT CLAVULANATE 875-125 MG PO TABS: 1.0000 | ORAL_TABLET | Freq: Two times a day (BID) | ORAL | Status: DC

## 2015-02-09 LAB — CULTURE, URINE COMPREHENSIVE

## 2015-02-12 ENCOUNTER — Encounter: Payer: Self-pay | Admitting: Internal Medicine

## 2015-02-12 DIAGNOSIS — R300 Dysuria: Secondary | ICD-10-CM | POA: Insufficient documentation

## 2015-02-12 NOTE — Assessment & Plan Note (Signed)
Low carb diet and exercise.  a1c just checked 5.4.  Follow met b and a1c.

## 2015-02-12 NOTE — Assessment & Plan Note (Signed)
Seeing neurology.  On mestinon and MTX.  Follow.  Stable.

## 2015-02-12 NOTE — Assessment & Plan Note (Signed)
Has been followed by dermatology.  

## 2015-02-12 NOTE — Assessment & Plan Note (Signed)
Blood pressure under good control.  Continue same medication regimen.  Follow pressures.  Follow metabolic panel.   

## 2015-02-12 NOTE — Assessment & Plan Note (Signed)
Low cholesterol diet and exercise.  LDL 125.  Discussed cholesterol medication.  Sister declines.  Follow cholesterol panel.

## 2015-02-12 NOTE — Assessment & Plan Note (Signed)
Discussed diet and exercise 

## 2015-02-12 NOTE — Assessment & Plan Note (Signed)
Check urine dip and culture.  Treat if needed.

## 2015-02-14 ENCOUNTER — Other Ambulatory Visit: Payer: Self-pay | Admitting: Internal Medicine

## 2015-02-14 MED ORDER — AMOXICILLIN-POT CLAVULANATE 875-125 MG PO TABS: 1.0000 | ORAL_TABLET | Freq: Two times a day (BID) | ORAL | Status: DC

## 2015-02-14 NOTE — Progress Notes (Signed)
Sent in rx for five more days augmentin.   See result note.

## 2015-02-18 DIAGNOSIS — C439 Malignant melanoma of skin, unspecified: Secondary | ICD-10-CM

## 2015-02-18 HISTORY — DX: Malignant melanoma of skin, unspecified: C43.9

## 2015-03-19 ENCOUNTER — Other Ambulatory Visit: Payer: Self-pay | Admitting: Internal Medicine

## 2015-03-20 ENCOUNTER — Other Ambulatory Visit: Payer: Self-pay

## 2015-03-20 ENCOUNTER — Telehealth: Payer: Self-pay

## 2015-03-20 MED ORDER — PYRIDOSTIGMINE BROMIDE 60 MG PO TABS: ORAL_TABLET | ORAL | Status: DC

## 2015-03-20 NOTE — Telephone Encounter (Signed)
Made appt for this Thursday 03/22/2015 with Dr. Keturah Barre

## 2015-03-20 NOTE — Telephone Encounter (Signed)
Please call pt and make her yearly appt. We have refilled her medications through February of 2017 but she will need another appt before we will refill again.  Thank you!

## 2015-03-22 ENCOUNTER — Encounter: Payer: Self-pay | Admitting: Neurology

## 2015-03-22 ENCOUNTER — Encounter: Payer: Self-pay | Admitting: Podiatry

## 2015-03-22 ENCOUNTER — Ambulatory Visit (INDEPENDENT_AMBULATORY_CARE_PROVIDER_SITE_OTHER): Payer: Medicare Other | Admitting: Neurology

## 2015-03-22 ENCOUNTER — Ambulatory Visit (INDEPENDENT_AMBULATORY_CARE_PROVIDER_SITE_OTHER): Payer: Medicare Other | Admitting: Podiatry

## 2015-03-22 VITALS — BP 138/88 | HR 82 | Resp 20 | Ht 64.0 in | Wt 218.0 lb

## 2015-03-22 DIAGNOSIS — G4733 Obstructive sleep apnea (adult) (pediatric): Secondary | ICD-10-CM

## 2015-03-22 DIAGNOSIS — M79676 Pain in unspecified toe(s): Secondary | ICD-10-CM | POA: Diagnosis not present

## 2015-03-22 DIAGNOSIS — E538 Deficiency of other specified B group vitamins: Secondary | ICD-10-CM

## 2015-03-22 DIAGNOSIS — G40309 Generalized idiopathic epilepsy and epileptic syndromes, not intractable, without status epilepticus: Secondary | ICD-10-CM | POA: Diagnosis not present

## 2015-03-22 DIAGNOSIS — F73 Profound intellectual disabilities: Secondary | ICD-10-CM | POA: Diagnosis not present

## 2015-03-22 DIAGNOSIS — G7000 Myasthenia gravis without (acute) exacerbation: Secondary | ICD-10-CM

## 2015-03-22 DIAGNOSIS — Z5181 Encounter for therapeutic drug level monitoring: Secondary | ICD-10-CM

## 2015-03-22 DIAGNOSIS — B351 Tinea unguium: Secondary | ICD-10-CM | POA: Diagnosis not present

## 2015-03-22 MED ORDER — PYRIDOSTIGMINE BROMIDE 60 MG PO TABS: ORAL_TABLET | ORAL | Status: DC

## 2015-03-22 NOTE — Progress Notes (Signed)
Patient ID: Madison Burns, female   DOB: 1950/10/24, 65 y.o.   MRN: [id number]  Subjective: 65 y.o. returns the office today for painful, elongated, thickened toenails which she is unable to trim herself. Denies any redness or drainage around the nails.Denies any acute changes since last appointment and no new complaints today. Denies any systemic complaints such as fevers, chills, nausea, vomiting.   Objective: Awake, Alert, NAD; presents with mother  DP/PT pulses palpable, CRT less than 3 seconds Nails hypertrophic, dystrophic, elongated, brittle, discolored 10. There is tenderness overlying the nails 1-5 bilaterally. There is no surrounding erythema or drainage along the nail sites. No open lesions or pre-ulcerative lesions are identified. No other areas of tenderness bilateral lower extremities. No overlying edema, erythema, increased warmth. No pain with calf compression, swelling, warmth, erythema.  Assessment: Patient presents with symptomatic onychomycosis  Plan: -Treatment options including alternatives, risks, complications were discussed -Nails sharply debrided 10 without complication/bleeding. -Discussed daily foot inspection. If there are any changes, to call the office immediately.  -Follow-up in 3 months or sooner if any problems are to arise. In the meantime, encouraged to call the office with any questions, concerns, changes symptoms.  Celesta Gentile, DPM

## 2015-03-22 NOTE — Patient Instructions (Signed)

## 2015-03-22 NOTE — Progress Notes (Signed)
SLEEP MEDICINE CLINIC   Provider:  [name], M D  Referring Provider: Einar Pheasant, MD Primary Care Physician:  Einar Pheasant, MD  Chief Complaint  Patient presents with  . Follow-up    MG, things going well, [facility] never called pt to schedule cpap, there is an outstanding bill and thinks this may be why they didn't call, rm 77, with sister    HPI:  Madison Burns is a 65 y.o. female seen here as a referral from Dr. Nicki Reaper for Myasthenia gravis, per special request of the patient.     Madison Burns carries a diagnosis of myasthenia gravis associated with difficulties swallowing, with a decreased level of energy a high degree of fatigue sometimes best interest in activities facial droop eyelid droop sleepiness snoring easy bruising, coughing, trouble swallowing she has a mild hoarseness but not as significant dysphonia and incontinence.  Her family and the patient looking for a maintenance  Neurologist to keep her myasthenia under control. The patient is sleeping 12 hours easily per day, from 10 Pm to 8 AM. She has a droopy right eye.  She missed frequently the 3rd. Mestinon.  She has been followed by Dr. Melrose Nakayama at the [facility] after following Dr. Paulla Dolly for years . The patient was diagnosed in 2011 with myasthenia gravis. She was hospitalized with myasthnic crisis Jan 27 2010- through [facility] 14th 2012. She was send to rehabilitation after that. Her sister noticed a decline in cognitive skills at that time.   Again,  her first treatment had been prednisone and this was just weaned off by September 2015. The patient carries a diagnosis of hypercholesterolemia. She also has a past surgical history of appendectomy in 1999, hysterectomy in 2004 and the melanoma was removed in 2014 from the right side of the neck close to the TM joint.  Madison Burns is single without children,  Lives with her sister now used to live with her mother. She is MRDD, she left school in 7th grade, went  to a special facility until her GED, [employer]. She used to work at a [employer] ,later in a vocational trade.   Madison Burns is taking methotrexate to control them myasthenia she is also on Mestinon 60 mg 3 times a day she takes Detrol for urinary continence at 4 mg in the morning and she takes Risperdal 2 mg I mouth at bedtime. She is also treated for hypothyroidism with levothyroxine at 50 g daily. The methotrexate is given at 8 tablets once a week. Until last September 2015 the patient had been on prednisone. She has not had labs drawn for the last 5 month, while continuing on MTX.  Today's visit is solely dedicated to the sleep disorder part of her neurological care date of visit second of October 2017.   Mrs. Rosenau underwent a sleep apnea test as a home sleep test, the AHI was 20.7 the lowest desaturation was 61 with 309 minutes of desaturation. This of course a possibility that in a home sleep test the hypoxemia is overestimated or artifact related but she does have a significant amount of obstructive sleep apnea as well. For this reason and because of her underlying condition of myasthenia gravis the patient was asked to undergo an attended CPAP titration. This took place on 04-19-14 the AHI was 0.3 on CPAP of only 5 cm water. Her SPO2 nadir rose to 91% no added oxygen was needed. I prescribed a ResMed air-fluid P 10 in medium size and  an auto CPAP from 4 through 8 cm water. The patient states that she has not received CPAP machine that she had an outstanding bill was advanced home care which may be the reason.  She is on Medicare/ Medicaid  and a sleep study cannot be older than 6 months to allow her to obtain a machine - so I will have to repeat her sleep study. Her Epworth sleepiness score remains high at 17 points, her depression score at 3.5, fatigue severity questionnaire at 38 points. MRDD - needs assistence with CPAP, certainly would need a desensitization session with [facility] in [place].     Review of Systems: Out of a complete 14 system review, the patient complains of only the following symptoms, and all other reviewed systems are negative. ", Palpitations, weight gain on prednisone him a urinary incontinence, myalgia.learning disabilities. MRDD. Sleepiness worst in the morning,but sttaed she feels refreshed.  She .snores a little, she sleeps in a bed with 1 large pillow, she sleeps in daytime in a recliner.   Epworth score 15 , Fatigue severity score 46   , depression score n/a    Social History   Social History  . Marital Status: Single    Spouse Name: N/A  . Number of Children: 0  . Years of Education: Special Ed   Occupational History  . Unemployed    Social History Main Topics  . Smoking status: Never Smoker   . Smokeless tobacco: Never Used  . Alcohol Use: No  . Drug Use: No  . Sexual Activity: Not on file   Other Topics Concern  . Not on file   Social History Narrative   Regular exercise-no   Caffeine Use-yes    Family History  Problem Relation Age of Onset  . CVA Maternal Grandfather   . Heart disease Father     myocardial infarction  . Hyperlipidemia Father   . Hyperlipidemia Sister   . Colon cancer Maternal Grandfather     stomach/colon  . Bone cancer      cousin  . Alzheimer's disease Mother     maternal aunts    Past Medical History  Diagnosis Date  . GERD (gastroesophageal reflux disease)   . Hypertension   . Hypercholesterolemia   . History of seizure disorder   . Myasthenia gravis ([place])   . Irritable bowel syndrome     Past Surgical History  Procedure Laterality Date  . Cholecystectomy  2013  . Appendectomy  1999  . Abdominal hysterectomy    . Tonsillectomy      Current Outpatient Prescriptions  Medication Sig Dispense Refill  . amoxicillin-clavulanate (AUGMENTIN) 875-125 MG tablet Take 1 tablet by mouth 2 (two) times daily. 10 tablet 0  . levothyroxine (SYNTHROID, LEVOTHROID) 50 MCG tablet TAKE ONE (1) TABLET  BY MOUTH EVERY DAY 30 tablet 11  . methotrexate (RHEUMATREX) 2.5 MG tablet Caution:Chemotherapy. 8 tab per week at once.   Protect from light.  Total 20mg  weekly by taking 8 tablets. 32 tablet 5  . omeprazole (PRILOSEC) 20 MG capsule TAKE ONE CAPSULE BY MOUTH DAILY 30 capsule 5  . pyridostigmine (MESTINON) 60 MG tablet Take one at waking up in AM  8 , and one at lunch and one at 8 PM> 90 tablet 1  . risperiDONE (RISPERDAL) 2 MG tablet Take 2 mg by mouth at bedtime.    . tolterodine (DETROL LA) 4 MG 24 hr capsule TAKE ONE CAPSULE BY MOUTH EACH MORNING AS DIRECTED 90 capsule 0  No current facility-administered medications for this visit.    Allergies as of 03/22/2015 - Review Complete 03/22/2015  Allergen Reaction Noted  . Ciprofloxacin Other (See Comments) 02/07/2015  . Levofloxacin Other (See Comments) 02/07/2015    Vitals: BP 138/88 mmHg  Pulse 82  Resp 20  Ht 5\' 4"  (1.626 m)  Wt 218 lb (98.884 kg)  BMI 37.40 kg/m2 Last Weight:  Wt Readings from Last 1 Encounters:  03/22/15 218 lb (98.884 kg)       Last Height:   Ht Readings from Last 1 Encounters:  03/22/15 5\' 4"  (1.626 m)    Physical exam:  General: The patient is awake, alert and appears not in acute distress. The patient is well groomed. Head: Normocephalic, atraumatic. Neck is supple. Mallampati 4  neck circumference 18 *.  Decreased Nasal airflow congestion related, , TMJ is  Not evident . Retrognathia seen. Macroglossia.  Cardiovascular:  Regular rate and rhythm , without  murmurs or carotid bruit, and without distended neck veins. Respiratory: Lungs are clear to auscultation. Skin:  Without evidence of edema, or rash Trunk: BMI is  elevated and patient  has normal posture.  Neurologic exam : The patient is awake and alert, oriented to place and time.   Memory  intact.  Speech is not fluent with dysphonia ,  aphasia. Mood and affect are aloof . Cranial nerves: Pupils are equal and briskly reactive to light.  Funduscopic exam without  evidence of pallor or edema.  Ptosis on the right. The upper eye lid touches the pupil. Extraocular movements  in vertical and horizontal planes intact and without nystagmus. Visual fields by finger perimetry are intact. Hearing to finger rub intact. Facial sensation intact to fine touch. Facial motor strength is symmetric and tongue and uvula move midline.  Motor exam:  Decreased overall tone, weak grip, unable to step on the stepping stool, she i sits slouched, she needs assistance with turns.   Sensory:  Fine touch, pinprick and vibration were normal.  Coordination: Rapid alternating movements in the fingers/hands is normal.  Finger-to-nose maneuver  normal without evidence of ataxia, dysmetria or tremor.  Gait and station: Patient walks without assistive device and is able with some assistance to climb up to the exam table.  Strength within normal limits. Stance is stable and normal.  Tandem gait is deferred, patient walks with propulsive posture, wider base and shuffling.   Deep tendon reflexes: in the  upper and lower extremities are symmetric and intact. Babinski down going.    Assessment:  After physical and neurologic examination, review of laboratory studies, imaging, neurophysiology testing and pre-existing records, assessment is    The patient was advised of the nature of the diagnosed sleep disorder, OSA with hypoxemia and in context of  The underlying autoimmune  disorder -MG , the treatment options and risks for general a health and wellness arising from not treating the condition. Visit duration was 25 minutes.  This patient has MRDD, learning disabilities, never lived independent.  All medical relevant information was obtained w form mother and sister.  She has a very low muscle tone, no grip strength, sleeps best in a recliner or on a large wedging pillow.  She has not had seizures for 4.5  years  ( 2012)and was weaned off medication after a  hospitalization.  Hirsutism  PS: She was not a floppy baby, 6 pounds birth weight,  normal suck at birth.   Plan:  Treatment plan and additional workup :  Continue  with MTX, for now, Order MTX level, folic acid. Refill on Mestinon. I encourage tid regimen.   I will reorder a sleep study.  Retitration and generate new order for CPAP from there.  CMET, CBC and differential.  Claryce Friel, MD  DR Paulla Dolly. Dr. Katherina Mires Shandell Giovanni MD  03/22/2015

## 2015-03-26 ENCOUNTER — Other Ambulatory Visit (INDEPENDENT_AMBULATORY_CARE_PROVIDER_SITE_OTHER): Payer: Self-pay

## 2015-03-26 DIAGNOSIS — G7000 Myasthenia gravis without (acute) exacerbation: Secondary | ICD-10-CM

## 2015-03-26 DIAGNOSIS — G40309 Generalized idiopathic epilepsy and epileptic syndromes, not intractable, without status epilepticus: Secondary | ICD-10-CM

## 2015-03-26 DIAGNOSIS — Z0289 Encounter for other administrative examinations: Secondary | ICD-10-CM

## 2015-03-26 DIAGNOSIS — F73 Profound intellectual disabilities: Secondary | ICD-10-CM

## 2015-03-26 DIAGNOSIS — G4733 Obstructive sleep apnea (adult) (pediatric): Secondary | ICD-10-CM

## 2015-03-28 ENCOUNTER — Ambulatory Visit (INDEPENDENT_AMBULATORY_CARE_PROVIDER_SITE_OTHER): Payer: Medicare Other | Admitting: Neurology

## 2015-03-28 DIAGNOSIS — G40309 Generalized idiopathic epilepsy and epileptic syndromes, not intractable, without status epilepticus: Secondary | ICD-10-CM

## 2015-03-28 DIAGNOSIS — G4733 Obstructive sleep apnea (adult) (pediatric): Secondary | ICD-10-CM

## 2015-03-28 DIAGNOSIS — G7000 Myasthenia gravis without (acute) exacerbation: Secondary | ICD-10-CM

## 2015-03-28 DIAGNOSIS — F73 Profound intellectual disabilities: Secondary | ICD-10-CM

## 2015-03-29 ENCOUNTER — Telehealth: Payer: Self-pay

## 2015-03-29 DIAGNOSIS — D521 Drug-induced folate deficiency anemia: Secondary | ICD-10-CM

## 2015-03-29 DIAGNOSIS — G4733 Obstructive sleep apnea (adult) (pediatric): Secondary | ICD-10-CM

## 2015-03-29 NOTE — Sleep Study (Signed)
Please see the scanned sleep study interpretation located in the procedure tab in the chart view section.  

## 2015-03-29 NOTE — Addendum Note (Signed)
Addended by: Lester [place] A on: 03/29/2015 05:33 PM   Modules accepted: Orders

## 2015-03-29 NOTE — Telephone Encounter (Signed)
Spoke with caregiver, [name] per DPR. I advised her of the previous message. She knows that the folate test might not be covered by medicare and will sign the ABN before getting pt's labs drawn. Pt and caregiver will be here on 2/15 at 4:00 to have labs drawn. ABN is ready. Labs placed. I advised her that when we have results back , we can discuss the MTX refill.

## 2015-03-29 NOTE — Telephone Encounter (Signed)
error 

## 2015-03-29 NOTE — Telephone Encounter (Signed)
I called Madison Burns and caregiver, no answer, left message asking them to call me back.  Received a refill request on Madison Burns's methotrexate. Before Dr. Brett Fairy will refill, she wants a cmet, cbc diff, methotrexate level, and folic acid level checked. The orders will not go through because Madison Burns's insurance will not cover the folic acid level, and she will be responsible out of pocket about $36.00 per the ABN. The ABN must be signed before the blood draw. I called to discuss.

## 2015-03-30 ENCOUNTER — Other Ambulatory Visit: Payer: Self-pay | Admitting: Neurology

## 2015-04-04 ENCOUNTER — Other Ambulatory Visit (INDEPENDENT_AMBULATORY_CARE_PROVIDER_SITE_OTHER): Payer: Self-pay

## 2015-04-04 DIAGNOSIS — G4733 Obstructive sleep apnea (adult) (pediatric): Secondary | ICD-10-CM

## 2015-04-04 DIAGNOSIS — Z0289 Encounter for other administrative examinations: Secondary | ICD-10-CM

## 2015-04-04 DIAGNOSIS — G7000 Myasthenia gravis without (acute) exacerbation: Secondary | ICD-10-CM

## 2015-04-04 DIAGNOSIS — F73 Profound intellectual disabilities: Secondary | ICD-10-CM | POA: Diagnosis not present

## 2015-04-04 DIAGNOSIS — G40309 Generalized idiopathic epilepsy and epileptic syndromes, not intractable, without status epilepticus: Secondary | ICD-10-CM | POA: Diagnosis not present

## 2015-04-04 NOTE — Telephone Encounter (Signed)
ABN for folate lab given to billing manager, [place].

## 2015-04-04 NOTE — Telephone Encounter (Signed)
Pt came by and had her blood work completed. Pt's caregiver and sister, Jewelissa Shoulders, signed the ABN that states that Medicare might not pay for the folate labwork and the estimated cost is $36. I advised them that when the bloodwork comes back, and if it is ok, Dr. Brett Fairy will refill pt's methotrexate. Pt's sister verbalized understanding.  I also spoke to pt and caregiver regarding her sleep study results. I advised them that her sleep study showed that cpap was effective and Dr. Brett Fairy recommends starting a cpap. Pt and caregiver agreeable. I advised them that I would send the order for cpap to [facility]. I advised them to have the pt use her cpap at least four or more hours per night. Pt verbalized understanding. A follow up appt was made for 4/19 at 11:00. Pt verbalized understanding.

## 2015-04-04 NOTE — Addendum Note (Signed)
Addended by: Lester Peyton A on: 04/04/2015 04:23 PM   Modules accepted: Orders

## 2015-04-06 ENCOUNTER — Other Ambulatory Visit: Payer: Self-pay | Admitting: Neurology

## 2015-04-06 LAB — CBC WITH DIFFERENTIAL/PLATELET
BASOS ABS: 0.1 10*3/uL (ref 0.0–0.2)
BASOS: 1 %
EOS (ABSOLUTE): 0.2 10*3/uL (ref 0.0–0.4)
Eos: 2 %
Hematocrit: 43.2 % (ref 34.0–46.6)
Hemoglobin: 14.7 g/dL (ref 11.1–15.9)
IMMATURE GRANS (ABS): 0 10*3/uL (ref 0.0–0.1)
IMMATURE GRANULOCYTES: 0 %
LYMPHS: 18 %
Lymphocytes Absolute: 1.8 10*3/uL (ref 0.7–3.1)
MCH: 32.8 pg (ref 26.6–33.0)
MCHC: 34 g/dL (ref 31.5–35.7)
MCV: 96 fL (ref 79–97)
MONOS ABS: 0.7 10*3/uL (ref 0.1–0.9)
Monocytes: 7 %
NEUTROS PCT: 72 %
Neutrophils Absolute: 7.3 10*3/uL — ABNORMAL HIGH (ref 1.4–7.0)
PLATELETS: 222 10*3/uL (ref 150–379)
RBC: 4.48 x10E6/uL (ref 3.77–5.28)
RDW: 15.6 % — AB (ref 12.3–15.4)
WBC: 10 10*3/uL (ref 3.4–10.8)

## 2015-04-06 LAB — COMPREHENSIVE METABOLIC PANEL
A/G RATIO: 2 (ref 1.1–2.5)
ALK PHOS: 86 IU/L (ref 39–117)
ALT: 37 IU/L — AB (ref 0–32)
AST: 39 IU/L (ref 0–40)
Albumin: 4 g/dL (ref 3.6–4.8)
BUN/Creatinine Ratio: 10 — ABNORMAL LOW (ref 11–26)
BUN: 8 mg/dL (ref 8–27)
Bilirubin Total: 0.4 mg/dL (ref 0.0–1.2)
CALCIUM: 8.6 mg/dL — AB (ref 8.7–10.3)
CHLORIDE: 105 mmol/L (ref 96–106)
CO2: 23 mmol/L (ref 18–29)
Creatinine, Ser: 0.79 mg/dL (ref 0.57–1.00)
GFR calc Af Amer: 92 mL/min/{1.73_m2} (ref >59)
GFR, EST NON AFRICAN AMERICAN: 80 mL/min/{1.73_m2} (ref >59)
Globulin, Total: 2 g/dL (ref 1.5–4.5)
Glucose: 98 mg/dL (ref 65–99)
POTASSIUM: 4 mmol/L (ref 3.5–5.2)
Sodium: 144 mmol/L (ref 134–144)
Total Protein: 6 g/dL (ref 6.0–8.5)

## 2015-04-06 LAB — FOLATE

## 2015-04-06 LAB — METHOTREXATE: METHOTREXATE LVL: NOT DETECTED umol/L (ref 0.02–5.00)

## 2015-04-06 NOTE — Telephone Encounter (Signed)
Pt sister called. Requesting MTX refill. Labs reviewed. Will send in refill and forward for Dr. Brett Fairy to review on Monday. -VRP

## 2015-04-09 ENCOUNTER — Other Ambulatory Visit: Payer: Self-pay | Admitting: Internal Medicine

## 2015-04-09 NOTE — Telephone Encounter (Signed)
I have not prescribed this medication.  Need to find out from pharmacy who has been prescribing.

## 2015-04-09 NOTE — Telephone Encounter (Signed)
Dr. Brett Fairy has been prescribing this medication since 2016. Medication has been refilled by Dr. Leta Baptist in Dr. Edwena Felty absence.

## 2015-04-09 NOTE — Telephone Encounter (Signed)
Methotrexate was not managed by [facility] but by Dr Nicki Reaper, PCP. ?

## 2015-04-11 ENCOUNTER — Other Ambulatory Visit: Payer: Self-pay | Admitting: Internal Medicine

## 2015-04-11 DIAGNOSIS — R7989 Other specified abnormal findings of blood chemistry: Secondary | ICD-10-CM

## 2015-04-11 DIAGNOSIS — R945 Abnormal results of liver function studies: Secondary | ICD-10-CM

## 2015-04-11 NOTE — Progress Notes (Signed)
Order place for f/u liver panel.

## 2015-04-12 ENCOUNTER — Telehealth: Payer: Self-pay

## 2015-04-12 NOTE — Telephone Encounter (Signed)
-----   Message from [name], MD sent at 04/11/2015  5:02 PM EST ----- Slight elevation in liver enzymes, share result with PCP, please.

## 2015-04-12 NOTE — Telephone Encounter (Signed)
Spoke to pt's caregiver, [name]. I advised her that Dr. Brett Fairy reviewed her labs and saw a slight elevation in liver enzymes, and this information will be shared with Dr. Nicki Reaper. Pt's sister verbalized understanding.

## 2015-04-20 ENCOUNTER — Encounter: Payer: Self-pay | Admitting: *Deleted

## 2015-05-07 DIAGNOSIS — I10 Essential (primary) hypertension: Secondary | ICD-10-CM | POA: Diagnosis not present

## 2015-05-07 DIAGNOSIS — E7800 Pure hypercholesterolemia, unspecified: Secondary | ICD-10-CM | POA: Diagnosis not present

## 2015-05-07 DIAGNOSIS — G7000 Myasthenia gravis without (acute) exacerbation: Secondary | ICD-10-CM | POA: Diagnosis not present

## 2015-05-07 DIAGNOSIS — E669 Obesity, unspecified: Secondary | ICD-10-CM | POA: Diagnosis not present

## 2015-05-07 DIAGNOSIS — G4730 Sleep apnea, unspecified: Secondary | ICD-10-CM | POA: Diagnosis not present

## 2015-05-07 DIAGNOSIS — G4710 Hypersomnia, unspecified: Secondary | ICD-10-CM | POA: Diagnosis not present

## 2015-05-24 DIAGNOSIS — F71 Moderate intellectual disabilities: Secondary | ICD-10-CM | POA: Diagnosis not present

## 2015-05-24 DIAGNOSIS — F419 Anxiety disorder, unspecified: Secondary | ICD-10-CM | POA: Diagnosis not present

## 2015-05-24 DIAGNOSIS — Z79899 Other long term (current) drug therapy: Secondary | ICD-10-CM | POA: Diagnosis not present

## 2015-05-25 ENCOUNTER — Telehealth: Payer: Self-pay | Admitting: Internal Medicine

## 2015-05-25 NOTE — Telephone Encounter (Signed)
Left msg for pt to call office to reschedule AWV/msn

## 2015-06-05 ENCOUNTER — Other Ambulatory Visit (INDEPENDENT_AMBULATORY_CARE_PROVIDER_SITE_OTHER): Payer: Medicare Other

## 2015-06-05 ENCOUNTER — Ambulatory Visit: Payer: Medicare Other

## 2015-06-05 DIAGNOSIS — R945 Abnormal results of liver function studies: Secondary | ICD-10-CM

## 2015-06-05 DIAGNOSIS — C439 Malignant melanoma of skin, unspecified: Secondary | ICD-10-CM

## 2015-06-05 DIAGNOSIS — E7800 Pure hypercholesterolemia, unspecified: Secondary | ICD-10-CM

## 2015-06-05 DIAGNOSIS — R7989 Other specified abnormal findings of blood chemistry: Secondary | ICD-10-CM

## 2015-06-05 DIAGNOSIS — R739 Hyperglycemia, unspecified: Secondary | ICD-10-CM | POA: Diagnosis not present

## 2015-06-05 LAB — CBC WITH DIFFERENTIAL/PLATELET
BASOS ABS: 0 10*3/uL (ref 0.0–0.1)
Basophils Relative: 0.6 % (ref 0.0–3.0)
EOS PCT: 2.7 % (ref 0.0–5.0)
Eosinophils Absolute: 0.2 10*3/uL (ref 0.0–0.7)
HEMATOCRIT: 44.2 % (ref 36.0–46.0)
HEMOGLOBIN: 14.7 g/dL (ref 12.0–15.0)
LYMPHS PCT: 22.5 % (ref 12.0–46.0)
Lymphs Abs: 1.8 10*3/uL (ref 0.7–4.0)
MCHC: 33.3 g/dL (ref 30.0–36.0)
MCV: 97.7 fl (ref 78.0–100.0)
MONOS PCT: 4.6 % (ref 3.0–12.0)
Monocytes Absolute: 0.4 10*3/uL (ref 0.1–1.0)
Neutro Abs: 5.5 10*3/uL (ref 1.4–7.7)
Neutrophils Relative %: 69.6 % (ref 43.0–77.0)
Platelets: 209 10*3/uL (ref 150.0–400.0)
RBC: 4.52 Mil/uL (ref 3.87–5.11)
RDW: 16.5 % — ABNORMAL HIGH (ref 11.5–15.5)
WBC: 7.9 10*3/uL (ref 4.0–10.5)

## 2015-06-05 LAB — HEPATIC FUNCTION PANEL
ALBUMIN: 3.7 g/dL (ref 3.5–5.2)
ALK PHOS: 68 U/L (ref 39–117)
ALT: 33 U/L (ref 0–35)
AST: 36 U/L (ref 0–37)
Bilirubin, Direct: 0.1 mg/dL (ref 0.0–0.3)
Total Bilirubin: 0.6 mg/dL (ref 0.2–1.2)
Total Protein: 5.9 g/dL — ABNORMAL LOW (ref 6.0–8.3)

## 2015-06-05 LAB — BASIC METABOLIC PANEL
BUN: 7 mg/dL (ref 6–23)
CALCIUM: 8.7 mg/dL (ref 8.4–10.5)
CO2: 31 mEq/L (ref 19–32)
CREATININE: 0.87 mg/dL (ref 0.40–1.20)
Chloride: 106 mEq/L (ref 96–112)
GFR: 69.67 mL/min (ref >60.00)
GLUCOSE: 99 mg/dL (ref 70–99)
POTASSIUM: 3.9 meq/L (ref 3.5–5.1)
Sodium: 142 mEq/L (ref 135–145)

## 2015-06-05 LAB — LIPID PANEL
CHOL/HDL RATIO: 6
Cholesterol: 185 mg/dL (ref 0–200)
HDL: 32.1 mg/dL — AB (ref >39.00)
NonHDL: 152.44
Triglycerides: 269 mg/dL — ABNORMAL HIGH (ref 0.0–149.0)
VLDL: 53.8 mg/dL — AB (ref 0.0–40.0)

## 2015-06-05 LAB — LDL CHOLESTEROL, DIRECT: LDL DIRECT: 107 mg/dL

## 2015-06-05 LAB — HEMOGLOBIN A1C: Hgb A1c MFr Bld: 5.6 % (ref 4.6–6.5)

## 2015-06-06 ENCOUNTER — Ambulatory Visit (INDEPENDENT_AMBULATORY_CARE_PROVIDER_SITE_OTHER): Payer: Medicare Other | Admitting: Neurology

## 2015-06-06 ENCOUNTER — Encounter: Payer: Self-pay | Admitting: Neurology

## 2015-06-06 VITALS — BP 142/98 | HR 86 | Resp 20 | Ht 64.0 in | Wt 223.0 lb

## 2015-06-06 DIAGNOSIS — G4733 Obstructive sleep apnea (adult) (pediatric): Secondary | ICD-10-CM | POA: Diagnosis not present

## 2015-06-06 DIAGNOSIS — Z9989 Dependence on other enabling machines and devices: Principal | ICD-10-CM

## 2015-06-06 DIAGNOSIS — G7000 Myasthenia gravis without (acute) exacerbation: Secondary | ICD-10-CM | POA: Insufficient documentation

## 2015-06-06 NOTE — Progress Notes (Signed)
SLEEP MEDICINE CLINIC   Provider:  [name], M D  Referring Provider: Einar Pheasant, MD Primary Care Physician:  Einar Pheasant, MD  Chief Complaint  Patient presents with  . Follow-up    cpap going well, pt feels better with cpap, rm 10, with sister    HPI:  Madison Burns is a 65 y.o. female seen here as a referral from Dr. Nicki Reaper for Myasthenia gravis, per special request of the patient.     Madison Burns carries a diagnosis of myasthenia gravis associated with difficulties swallowing, with a decreased level of energy a high degree of fatigue sometimes best interest in activities facial droop eyelid droop sleepiness snoring easy bruising, coughing, trouble swallowing she has a mild hoarseness but not as significant dysphonia and incontinence.  Her family and the patient looking for a maintenance  Neurologist to keep her myasthenia under control. The patient is sleeping 12 hours easily per day, from 10 Pm to 8 AM. She has a droopy right eye.  She missed frequently the 3rd. Mestinon.  She has been followed by Dr. Melrose Nakayama at the [facility] clinic after following Dr. Paulla Dolly for years . The patient was diagnosed in 2011 with myasthenia gravis. She was hospitalized with myasthnic crisis Jan 27 2010- through [facility] 14th 2012. She was send to rehabilitation after that. Her sister noticed a decline in cognitive skills at that time.   Again,  her first treatment had been prednisone and this was just weaned off by September 2015. The patient carries a diagnosis of hypercholesterolemia. She also has a past surgical history of appendectomy in 1999, hysterectomy in 2004 and the melanoma was removed in 2014 from the right side of the neck close to the TM joint.  Madison Burns is single without children,  Lives with her sister now used to live with her mother. She is MRDD, she left school in 7th grade, went to a special facility until her GED, [employer]. She used to work at a [employer] ,later in a  vocational trade.   Madison Burns is taking methotrexate to control them myasthenia she is also on Mestinon 60 mg 3 times a day she takes Detrol for urinary continence at 4 mg in the morning and she takes Risperdal 2 mg I mouth at bedtime. She is also treated for hypothyroidism with levothyroxine at 50 g daily. The methotrexate is given at 8 tablets once a week. Until last September 2015 the patient had been on prednisone. She has not had labs drawn for the last 5 month, while continuing on MTX.  Today's visit is solely dedicated to the sleep disorder part of her neurological care date of visit second of October 2017.   Madison Burns underwent a sleep apnea test as a home sleep test, the AHI was 20.7 the lowest desaturation was 61 with 309 minutes of desaturation. This of course a possibility that in a home sleep test the hypoxemia is overestimated or artifact related but she does have a significant amount of obstructive sleep apnea as well. For this reason and because of her underlying condition of myasthenia gravis the patient was asked to undergo an attended CPAP titration. This took place on 04-19-14 the AHI was 0.3 on CPAP of only 5 cm water. Her SPO2 nadir rose to 91% no added oxygen was needed. I prescribed a ResMed air-fluid P 10 in medium size and an auto CPAP from 4 through 8 cm water. The patient states that she has not received  CPAP machine that she had an outstanding bill was advanced home care which may be the reason.  She is on Medicare/ Medicaid  and a sleep study cannot be older than 6 months to allow her to obtain a machine - so I will have to repeat her sleep study. Her Epworth sleepiness score remains high at 17 points, her depression score at 3.5, fatigue severity questionnaire at 38 points. MRDD - needs assistence with CPAP, certainly would need a desensitization session with [facility] in [place].   Interval history from 06/06/2015 I had the pleasure of seeing Madison Burns today and in time  after her last sleep study. She underwent a home sleep test on February 1 which confirmed that she has apnea at an AHI of 20.7 and oxygen nadir of 61% and a total desaturation time of 309 minutes. The study was followed by an in lab CPAP titration but she reached an AHI of 0.2. 5 cm water appeared to be effective. She is using a ResMed air fit P 10 in very small nasal pillows. The patient also endorsed the Epworth sleepiness score at 12 points now from 17 prior to CPAP use. She had is still in the habit of going to take daytime naps but she feels not as if she can't resist the urge to nap.  I have also the first compliance report available today she is 100% compliant has used it every day of the last 30 days and every day over 4 hours. Her average user time is 10 hours 8 minutes, her AHI is 1.1. She is struggling a little bit with allergic rhinitis, but  has been able to still comply with CPAP use.   Review of Systems: Out of a complete 14 system review, the patient complains of only the following symptoms, and all other reviewed systems are negative. ", Palpitations, weight gain on prednisone him a urinary incontinence, myalgia.learning disabilities. MRDD. Sleepiness worst in the morning,but sttaed she feels refreshed.  She .snores a little, she sleeps in a bed with 1 large pillow, she sleeps in daytime in a recliner.   Epworth score 15 , Fatigue severity score 46   , depression score n/a    Social History   Social History  . Marital Status: Single    Spouse Name: N/A  . Number of Children: 0  . Years of Education: Special Ed   Occupational History  . Unemployed    Social History Main Topics  . Smoking status: Never Smoker   . Smokeless tobacco: Never Used  . Alcohol Use: No  . Drug Use: No  . Sexual Activity: Not on file   Other Topics Concern  . Not on file   Social History Narrative   Regular exercise-no   Caffeine Use-yes    Family History  Problem Relation Age of Onset    . CVA Maternal Grandfather   . Heart disease Father     myocardial infarction  . Hyperlipidemia Father   . Hyperlipidemia Sister   . Colon cancer Maternal Grandfather     stomach/colon  . Bone cancer      cousin  . Alzheimer's disease Mother     maternal aunts    Past Medical History  Diagnosis Date  . GERD (gastroesophageal reflux disease)   . Hypertension   . Hypercholesterolemia   . History of seizure disorder   . Myasthenia gravis ([place])   . Irritable bowel syndrome     Past Surgical History  Procedure Laterality Date  .  Cholecystectomy  2013  . Appendectomy  1999  . Abdominal hysterectomy    . Tonsillectomy      Current Outpatient Prescriptions  Medication Sig Dispense Refill  . levothyroxine (SYNTHROID, LEVOTHROID) 50 MCG tablet TAKE ONE (1) TABLET BY MOUTH EVERY DAY 30 tablet 11  . methotrexate (RHEUMATREX) 2.5 MG tablet TAKE 8 TABLETS PER WEEK AT ONCE 32 tablet 5  . omeprazole (PRILOSEC) 20 MG capsule TAKE ONE CAPSULE BY MOUTH DAILY 30 capsule 5  . pyridostigmine (MESTINON) 60 MG tablet Take one at waking up in AM  8 , and one at lunch and one at 8 PM> 270 tablet 3  . risperiDONE (RISPERDAL) 2 MG tablet Take 2 mg by mouth at bedtime.    . tolterodine (DETROL LA) 4 MG 24 hr capsule TAKE ONE CAPSULE BY MOUTH EACH MORNING AS DIRECTED 90 capsule 0   No current facility-administered medications for this visit.    Allergies as of 06/06/2015 - Review Complete 06/06/2015  Allergen Reaction Noted  . Ciprofloxacin Other (See Comments) 02/07/2015  . Levofloxacin Other (See Comments) 02/07/2015    Vitals: BP 142/98 mmHg  Pulse 86  Resp 20  Ht 5\' 4"  (1.626 m)  Wt 223 lb (101.152 kg)  BMI 38.26 kg/m2 Last Weight:  Wt Readings from Last 1 Encounters:  06/06/15 223 lb (101.152 kg)       Last Height:   Ht Readings from Last 1 Encounters:  06/06/15 5\' 4"  (1.626 m)    Physical exam:  General: The patient is awake, alert and appears not in acute distress. The  patient is well groomed. Head: Normocephalic, atraumatic. Neck is supple. Mallampati 4  neck circumference 18 *.  Decreased Nasal airflow congestion related, , TMJ is  Not evident . Retrognathia seen. Macroglossia.  Cardiovascular:  Regular rate and rhythm , without  murmurs or carotid bruit, and without distended neck veins. Respiratory: Lungs are clear to auscultation. Skin:  Without evidence of edema, or rash Trunk: BMI is  elevated and patient  has normal posture.  Neurologic exam : The patient is awake and alert, oriented to place and time.   Memory  intact.   Speech is non- fluent with dysphonia, aphasia. Sparse word flow.  Cranial nerves: Pupils are equal and briskly reactive to light. Funduscopic exam without  evidence of pallor or edema.  Ptosis on the right. The upper eye lid touches the pupil. Extraocular movements  in vertical and horizontal planes intact and without nystagmus. Visual fields by finger perimetry are intact. Hearing to finger rub intact. Facial sensation intact to fine touch. Facial motor strength is symmetric and tongue and uvula move midline.  Motor exam:  Decreased overall tone, weak grip, unable to step on the stepping stool, she i sits slouched, she needs assistance with turns.  Sensory:  Fine touch, pinprick and vibration were intact.  Coordination:  Finger-to-nose maneuver  normal without evidence of ataxia, dysmetria or tremor. Gait and station: deferred.  Deep tendon reflexes: in the  upper and lower extremities are symmetric and intact. Babinski downgoing.    Assessment:  After physical and neurologic examination, review of laboratory studies, imaging, neurophysiology testing and pre-existing records, assessment is   The patient was advised of the nature of the diagnosed sleep disorder, the degree of OSA with marked hypoxemia and in context of the underlying autoimmune disorder -MG , the treatment options and risks for general a health and wellness  arising from not treating the condition. Visit duration was 25  minutes.   This patient has MRDD, learning disabilities, never lived independent.  All medical relevant information was obtained from her sister.  She has a very low muscle tone, no grip strength, sleeps best in a recliner or on a large wedging pillow.   She has not had seizures for 4.5  years ( 2012)and was weaned off medication after a hospitalization.   Plan:  Treatment plan and additional workup : Continue CPAP use as indicated. The patient did very well .   Continue with MTX, for now, Order MTX level, folic acid. Refill on Mestinon. I encourage tid regimen.    Estephan Gallardo, MD   Dr. Katherina Mires Irasema Chalk MD  06/06/2015

## 2015-06-06 NOTE — Patient Instructions (Signed)
Myasthenia Gravis Myasthenia gravis (MG) means severe weakness. It is a long-term (chronic) condition that causes weakness in the muscles you can control (voluntary muscles). MG can affect any voluntary muscle. The muscles most often affected are the ones that control:   Eye movement.  Facial movements.  Swallowing. MG is an autoimmune disease, which means that your body's defense system (immune system) attacks healthy parts of your body instead of germs and other things that make you sick. When you have MG, your immune system makes proteins (antibodies) that block the chemical (acetylcholine) your body needs to send nerve signals to your muscles. This causes muscle weakness. CAUSES  The exact cause of MG is unknown. One possible cause is an enlarged thymus gland, which is located under your breastbone.  SIGNS AND SYMPTOMS The earliest symptom of MG is muscle weakness that gets worse with activity and gets better after rest. Other symptoms of MG may include:  Drooping eyelids.  Double vision.  Loss of facial expression.  Trouble chewing and swallowing.  Slurred speech.  A waddling walk.  Weakness of the arms, hands, and legs. Trouble breathing is the most dangerous symptom of MG. Sudden and severe difficulty breathing (myasthenic crisis) may require emergency breathing support. This symptom sometimes happens after:   Infection.  Fever.  Drug reaction. DIAGNOSIS  It can be hard to diagnose MG because muscle weakness is a common symptom in many conditions. Your health care provider will do a physical exam. You may also have tests that will help make a diagnosis. These may include:  A blood test.  A test using the medicine edrophonium. This medicine increases muscle strength by slowing the breakdown of acetylcholine.  Tests to measure nerve conduction to muscle (electromyography).  An imaging study of the chest (CT or MRI). TREATMENT  Treatment can improve muscle strength.  Sometimes symptoms of MG go away for a while (remission) and you can stop treatment. Possible treatments include:  Medicine.  Removal of the thymus gland (thymectomy). This may result in a long remission for some people. HOME CARE INSTRUCTIONS  Take medicines only as directed by your health care provider.  Get plenty of rest to conserve your energy.  Take frequent breaks to rest your eyes.  Maintain a healthy diet and a healthy weight.  Do not use any tobacco products including cigarettes, chewing tobacco, or electronic cigarettes. If you need help quitting, ask your health care provider.  Keep all follow-up visits as directed by your health care provider. This is important. SEEK MEDICAL CARE IF:  Your symptoms get worse after a fever or infection.  You have a reaction to a medicine you are taking.  Your symptoms change or get worse. SEEK IMMEDIATE MEDICAL CARE IF: You have trouble breathing.    This information is not intended to replace advice given to you by your health care provider. Make sure you discuss any questions you have with your health care provider.   Document Released: 05/12/2000 Document Revised: 02/24/2014 Document Reviewed: 04/06/2013 Elsevier Interactive Patient Education 2016 Elsevier Inc.  

## 2015-06-12 ENCOUNTER — Encounter: Payer: Self-pay | Admitting: *Deleted

## 2015-06-14 ENCOUNTER — Ambulatory Visit (INDEPENDENT_AMBULATORY_CARE_PROVIDER_SITE_OTHER): Payer: Medicare Other | Admitting: Internal Medicine

## 2015-06-14 ENCOUNTER — Encounter: Payer: Self-pay | Admitting: Internal Medicine

## 2015-06-14 VITALS — BP 122/80 | HR 76 | Temp 97.8°F | Resp 12 | Ht 63.0 in | Wt 222.0 lb

## 2015-06-14 DIAGNOSIS — R739 Hyperglycemia, unspecified: Secondary | ICD-10-CM

## 2015-06-14 DIAGNOSIS — C439 Malignant melanoma of skin, unspecified: Secondary | ICD-10-CM

## 2015-06-14 DIAGNOSIS — G4733 Obstructive sleep apnea (adult) (pediatric): Secondary | ICD-10-CM | POA: Diagnosis not present

## 2015-06-14 DIAGNOSIS — Z1239 Encounter for other screening for malignant neoplasm of breast: Secondary | ICD-10-CM | POA: Diagnosis not present

## 2015-06-14 DIAGNOSIS — E669 Obesity, unspecified: Secondary | ICD-10-CM

## 2015-06-14 DIAGNOSIS — Z0000 Encounter for general adult medical examination without abnormal findings: Secondary | ICD-10-CM

## 2015-06-14 DIAGNOSIS — I10 Essential (primary) hypertension: Secondary | ICD-10-CM | POA: Diagnosis not present

## 2015-06-14 DIAGNOSIS — E7800 Pure hypercholesterolemia, unspecified: Secondary | ICD-10-CM

## 2015-06-14 DIAGNOSIS — Z9989 Dependence on other enabling machines and devices: Secondary | ICD-10-CM

## 2015-06-14 DIAGNOSIS — M79674 Pain in right toe(s): Secondary | ICD-10-CM

## 2015-06-14 DIAGNOSIS — G7000 Myasthenia gravis without (acute) exacerbation: Secondary | ICD-10-CM

## 2015-06-14 DIAGNOSIS — G40909 Epilepsy, unspecified, not intractable, without status epilepticus: Secondary | ICD-10-CM

## 2015-06-14 MED ORDER — CEPHALEXIN 500 MG PO CAPS: 500.0000 mg | ORAL_CAPSULE | Freq: Three times a day (TID) | ORAL | Status: DC

## 2015-06-14 NOTE — Patient Instructions (Signed)
Lotrimin cream - apply in between fourth and fifth toes twice a day.

## 2015-06-14 NOTE — Progress Notes (Signed)
Pre visit review using our clinic review tool, if applicable. No additional management support is needed unless otherwise documented below in the visit note. 

## 2015-06-14 NOTE — Progress Notes (Signed)
Patient ID: Madison Burns, female   DOB: 09/14/1950, 65 y.o.   MRN: [id number]   Subjective:    Patient ID: Madison Burns, female    DOB: Jul 07, 1950, 32 y.o.   MRN: [id number]  HPI  Patient with past history of hypercholesterolemia, GERD, myasthenia gravis and hypertension.  She comes in today to follow up on these issues.  She is accompanied by her sister.  History obtained from both of them.  She is using her CPAP.  Seeing neurology.  See her note.  Doing well with this.  Sister tries to keep her active.  No cardiac symptoms with increased activity or exertion.  No sob.  No acid reflux.  Bowels stable.  Does report some pain right fifth toe.  Some redness.  No injury or trauma that they are aware of.  Able to walk.     Past Medical History  Diagnosis Date  . GERD (gastroesophageal reflux disease)   . Hypertension   . Hypercholesterolemia   . History of seizure disorder   . Myasthenia gravis ([place])   . Irritable bowel syndrome    Past Surgical History  Procedure Laterality Date  . Cholecystectomy  2013  . Appendectomy  1999  . Abdominal hysterectomy    . Tonsillectomy     Family History  Problem Relation Age of Onset  . CVA Maternal Grandfather   . Heart disease Father     myocardial infarction  . Hyperlipidemia Father   . Hyperlipidemia Sister   . Colon cancer Maternal Grandfather     stomach/colon  . Bone cancer      cousin  . Alzheimer's disease Mother     maternal aunts   Social History   Social History  . Marital Status: Single    Spouse Name: N/A  . Number of Children: 0  . Years of Education: Special Ed   Occupational History  . Unemployed    Social History Main Topics  . Smoking status: Never Smoker   . Smokeless tobacco: Never Used  . Alcohol Use: No  . Drug Use: No  . Sexual Activity: Not Asked   Other Topics Concern  . None   Social History Narrative   Regular exercise-no   Caffeine Use-yes    Outpatient Encounter Prescriptions as of  06/14/2015  Medication Sig  . levothyroxine (SYNTHROID, LEVOTHROID) 50 MCG tablet TAKE ONE (1) TABLET BY MOUTH EVERY DAY  . methotrexate (RHEUMATREX) 2.5 MG tablet TAKE 8 TABLETS PER WEEK AT ONCE  . omeprazole (PRILOSEC) 20 MG capsule TAKE ONE CAPSULE BY MOUTH DAILY  . pyridostigmine (MESTINON) 60 MG tablet Take one at waking up in AM  8 , and one at lunch and one at 8 PM>  . risperiDONE (RISPERDAL) 2 MG tablet Take 2 mg by mouth at bedtime.  . tolterodine (DETROL Burns) 4 MG 24 hr capsule TAKE ONE CAPSULE BY MOUTH EACH MORNING AS DIRECTED  . cephALEXin (KEFLEX) 500 MG capsule Take 1 capsule (500 mg total) by mouth 3 (three) times daily.   No facility-administered encounter medications on file as of 06/14/2015.     Review of Systems  Constitutional: Negative for appetite change and unexpected weight change.  HENT: Negative for congestion and sinus pressure.   Eyes: Negative for pain and visual disturbance.  Respiratory: Negative for cough, chest tightness and shortness of breath.   Cardiovascular: Negative for chest pain, palpitations and leg swelling.  Gastrointestinal: Negative for nausea, vomiting, abdominal pain and diarrhea.  Genitourinary: Negative  for dysuria and difficulty urinating.  Musculoskeletal: Negative for back pain and joint swelling.  Skin: Negative for rash.       Increased redness - right fifth toe.  Minimal tenderness.  No pain with palpation of the foot.    Neurological: Negative for dizziness, light-headedness and headaches.  Hematological: Negative for adenopathy. Does not bruise/bleed easily.  Psychiatric/Behavioral: Negative for dysphoric mood and agitation.       Objective:    Physical Exam  Constitutional: She is oriented to person, place, and time. She appears well-developed and well-nourished. No distress.  HENT:  Nose: Nose normal.  Mouth/Throat: Oropharynx is clear and moist.  Eyes: Right eye exhibits no discharge. Left eye exhibits no discharge. No  scleral icterus.  Neck: Neck supple. No thyromegaly present.  Cardiovascular: Normal rate and regular rhythm.   Pulmonary/Chest: Breath sounds normal. No accessory muscle usage. No tachypnea. No respiratory distress. She has no decreased breath sounds. She has no wheezes. She has no rhonchi. Right breast exhibits no inverted nipple, no mass, no nipple discharge and no tenderness (no axillary adenopathy). Left breast exhibits no inverted nipple, no mass, no nipple discharge and no tenderness (no axilarry adenopathy).  Abdominal: Soft. Bowel sounds are normal. There is no tenderness.  Musculoskeletal: She exhibits no edema or tenderness.  Lymphadenopathy:    She has no cervical adenopathy.  Neurological: She is alert and oriented to person, place, and time.  Skin: Skin is warm. No rash noted.  Increased erythema - right great toe.    Psychiatric: She has a normal mood and affect. Her behavior is normal.    BP 122/80 mmHg  Pulse 76  Temp(Src) 97.8 F (36.6 C) (Oral)  Resp 12  Ht 5\' 3"  (1.6 m)  Wt 222 lb (100.699 kg)  BMI 39.34 kg/m2  SpO2 96% Wt Readings from Last 3 Encounters:  06/14/15 222 lb (100.699 kg)  06/06/15 223 lb (101.152 kg)  03/22/15 218 lb (98.884 kg)     Lab Results  Component Value Date   WBC 7.9 06/05/2015   HGB 14.7 06/05/2015   HCT 44.2 06/05/2015   PLT 209.0 06/05/2015   GLUCOSE 99 06/05/2015   CHOL 185 06/05/2015   TRIG 269.0* 06/05/2015   HDL 32.10* 06/05/2015   LDLDIRECT 107.0 06/05/2015   LDLCALC 125* 02/02/2015   ALT 33 06/05/2015   AST 36 06/05/2015   NA 142 06/05/2015   K 3.9 06/05/2015   CL 106 06/05/2015   CREATININE 0.87 06/05/2015   BUN 7 06/05/2015   CO2 31 06/05/2015   TSH 2.88 10/03/2014   HGBA1C 5.6 06/05/2015       Assessment & Plan:   Problem List Items Addressed This Visit    Health care maintenance    Physical today 06/14/15.  Overdue mammogram.  Schedule.  States up to date with GI for colonoscopy.  Sister declines f/u  colonoscopy.        Hypercholesterolemia    Low cholesterol diet and exercise.  Follow lipid panel.       Relevant Orders   Lipid panel   Hepatic function panel   Hyperglycemia - Primary   Relevant Orders   Hemoglobin A1c   Hypertension    Blood pressure under good control.  On no medication. Follow pressures.  Follow metabolic panel.        Relevant Orders   TSH   Melanoma ([place])    Followed by dermatology.       Myasthenia gravis in remission ([place])  Followed by neurology.  On mestinon.        Obesity (BMI 30-39.9)    Discussed diet and exercise.        OSA on CPAP    Doing well.  Follow.       Relevant Orders   Basic metabolic panel   Pain in right toe(s)    Pain in right fifth toe.  Some increase erythema and fungal infection between the toes.  Lotrimin cream topically.  Keflex as directed.  Follow.  Call with update on Monday.        Seizure disorder ([place])    Followed by neurology.  No seizures reported.         Other Visit Diagnoses    Breast cancer screening        Relevant Orders    MM DIGITAL SCREENING BILATERAL        Einar Pheasant, MD

## 2015-06-17 ENCOUNTER — Encounter: Payer: Self-pay | Admitting: Internal Medicine

## 2015-06-17 DIAGNOSIS — M79674 Pain in right toe(s): Secondary | ICD-10-CM | POA: Insufficient documentation

## 2015-06-17 NOTE — Assessment & Plan Note (Signed)
Followed by dermatology

## 2015-06-17 NOTE — Assessment & Plan Note (Signed)
Followed by neurology.  No seizures reported.

## 2015-06-17 NOTE — Assessment & Plan Note (Signed)
Followed by neurology. On mestinon.   

## 2015-06-17 NOTE — Assessment & Plan Note (Signed)
Pain in right fifth toe.  Some increase erythema and fungal infection between the toes.  Lotrimin cream topically.  Keflex as directed.  Follow.  Call with update on Monday.

## 2015-06-17 NOTE — Assessment & Plan Note (Signed)
Blood pressure under good control.  On no medication.  Follow pressures.  Follow metabolic panel.   

## 2015-06-17 NOTE — Assessment & Plan Note (Signed)
Discussed diet and exercise 

## 2015-06-17 NOTE — Assessment & Plan Note (Signed)
Low cholesterol diet and exercise.  Follow lipid panel.   

## 2015-06-17 NOTE — Assessment & Plan Note (Signed)
Physical today 06/14/15.  Overdue mammogram.  Schedule.  States up to date with GI for colonoscopy.  Sister declines f/u colonoscopy.

## 2015-06-17 NOTE — Assessment & Plan Note (Signed)
Doing well.  Follow.  

## 2015-06-18 ENCOUNTER — Telehealth: Payer: Self-pay | Admitting: *Deleted

## 2015-06-18 NOTE — Telephone Encounter (Signed)
[place]et [place].  Keep me posted if the toes does not completely resolve.

## 2015-06-18 NOTE — Telephone Encounter (Signed)
FYI Patient's sister wanted to update that patient's  toe seems to be doing better, currently she has no pain.

## 2015-06-21 ENCOUNTER — Other Ambulatory Visit: Payer: Self-pay | Admitting: Internal Medicine

## 2015-06-21 ENCOUNTER — Ambulatory Visit
Admission: RE | Admit: 2015-06-21 | Discharge: 2015-06-21 | Disposition: A | Discharge status: Home / Self Care | Payer: Medicare Other | Source: Ambulatory Visit | Attending: Internal Medicine | Admitting: Internal Medicine

## 2015-06-21 DIAGNOSIS — Z1239 Encounter for other screening for malignant neoplasm of breast: Secondary | ICD-10-CM

## 2015-06-21 DIAGNOSIS — Z1231 Encounter for screening mammogram for malignant neoplasm of breast: Secondary | ICD-10-CM | POA: Insufficient documentation

## 2015-07-02 ENCOUNTER — Other Ambulatory Visit: Payer: Self-pay | Admitting: Internal Medicine

## 2015-07-24 ENCOUNTER — Encounter: Payer: Self-pay | Admitting: Podiatry

## 2015-07-24 ENCOUNTER — Ambulatory Visit (INDEPENDENT_AMBULATORY_CARE_PROVIDER_SITE_OTHER): Payer: Medicare Other | Admitting: Podiatry

## 2015-07-24 DIAGNOSIS — M79676 Pain in unspecified toe(s): Secondary | ICD-10-CM | POA: Diagnosis not present

## 2015-07-24 DIAGNOSIS — B351 Tinea unguium: Secondary | ICD-10-CM | POA: Diagnosis not present

## 2015-07-24 NOTE — Progress Notes (Signed)
Patient ID: [name], female   DOB: October 29, 1950, 65 y.o.   MRN: [id number]  Subjective: 65 y.o. returns the office today for painful, elongated, thickened toenails which she is unable to trim herself. Denies any redness or drainage around the nails.Denies any acute changes since last appointment and no new complaints today. Denies any systemic complaints such as fevers, chills, nausea, vomiting.   Objective: Awake, Alert, NAD DP/PT pulses palpable, CRT less than 3 seconds Nails hypertrophic, dystrophic, elongated, brittle, discolored 10. There is tenderness overlying the nails 1-5 bilaterally. There is no surrounding erythema or drainage along the nail sites. No open lesions or pre-ulcerative lesions are identified. No other areas of tenderness bilateral lower extremities. No overlying edema, erythema, increased warmth. No pain with calf compression, swelling, warmth, erythema.  Assessment: Patient presents with symptomatic onychomycosis  Plan: -Treatment options including alternatives, risks, complications were discussed -Nails sharply debrided 10 without complication/bleeding. -Discussed daily foot inspection. If there are any changes, to call the office immediately.  -Follow-up in 3 months or sooner if any problems are to arise. In the meantime, encouraged to call the office with any questions, concerns, changes symptoms.  Celesta Gentile, DPM

## 2015-08-06 ENCOUNTER — Other Ambulatory Visit: Payer: Self-pay | Admitting: Internal Medicine

## 2015-09-10 ENCOUNTER — Other Ambulatory Visit: Payer: Self-pay | Admitting: Diagnostic Neuroimaging

## 2015-09-20 DIAGNOSIS — F71 Moderate intellectual disabilities: Secondary | ICD-10-CM | POA: Diagnosis not present

## 2015-09-20 DIAGNOSIS — F419 Anxiety disorder, unspecified: Secondary | ICD-10-CM | POA: Diagnosis not present

## 2015-10-08 ENCOUNTER — Other Ambulatory Visit: Payer: Self-pay | Admitting: Neurology

## 2015-10-11 ENCOUNTER — Other Ambulatory Visit (INDEPENDENT_AMBULATORY_CARE_PROVIDER_SITE_OTHER): Payer: Medicare Other

## 2015-10-11 DIAGNOSIS — G4733 Obstructive sleep apnea (adult) (pediatric): Secondary | ICD-10-CM | POA: Diagnosis not present

## 2015-10-11 DIAGNOSIS — Z9989 Dependence on other enabling machines and devices: Secondary | ICD-10-CM

## 2015-10-11 DIAGNOSIS — I10 Essential (primary) hypertension: Secondary | ICD-10-CM | POA: Diagnosis not present

## 2015-10-11 DIAGNOSIS — R739 Hyperglycemia, unspecified: Secondary | ICD-10-CM

## 2015-10-11 DIAGNOSIS — E7800 Pure hypercholesterolemia, unspecified: Secondary | ICD-10-CM | POA: Diagnosis not present

## 2015-10-11 LAB — HEPATIC FUNCTION PANEL
ALBUMIN: 3.9 g/dL (ref 3.5–5.2)
ALT: 29 U/L (ref 0–35)
AST: 32 U/L (ref 0–37)
Alkaline Phosphatase: 78 U/L (ref 39–117)
Bilirubin, Direct: 0.1 mg/dL (ref 0.0–0.3)
Total Bilirubin: 0.5 mg/dL (ref 0.2–1.2)
Total Protein: 6.3 g/dL (ref 6.0–8.3)

## 2015-10-11 LAB — LDL CHOLESTEROL, DIRECT: Direct LDL: 145 mg/dL

## 2015-10-11 LAB — BASIC METABOLIC PANEL
BUN: 11 mg/dL (ref 6–23)
CALCIUM: 8.7 mg/dL (ref 8.4–10.5)
CHLORIDE: 106 meq/L (ref 96–112)
CO2: 31 meq/L (ref 19–32)
Creatinine, Ser: 0.93 mg/dL (ref 0.40–1.20)
GFR: 64.44 mL/min (ref >60.00)
Glucose, Bld: 113 mg/dL — ABNORMAL HIGH (ref 70–99)
POTASSIUM: 3.8 meq/L (ref 3.5–5.1)
SODIUM: 142 meq/L (ref 135–145)

## 2015-10-11 LAB — LIPID PANEL
CHOL/HDL RATIO: 5
Cholesterol: 212 mg/dL — ABNORMAL HIGH (ref 0–200)
HDL: 40.1 mg/dL (ref >39.00)
NONHDL: 171.75
TRIGLYCERIDES: 209 mg/dL — AB (ref 0.0–149.0)
VLDL: 41.8 mg/dL — AB (ref 0.0–40.0)

## 2015-10-11 LAB — TSH: TSH: 2.99 u[IU]/mL (ref 0.35–4.50)

## 2015-10-11 LAB — HEMOGLOBIN A1C: HEMOGLOBIN A1C: 5.4 % (ref 4.6–6.5)

## 2015-10-15 DIAGNOSIS — H02826 Cysts of left eye, unspecified eyelid: Secondary | ICD-10-CM | POA: Diagnosis not present

## 2015-10-16 ENCOUNTER — Ambulatory Visit (INDEPENDENT_AMBULATORY_CARE_PROVIDER_SITE_OTHER): Payer: Medicare Other | Admitting: Internal Medicine

## 2015-10-16 ENCOUNTER — Encounter: Payer: Self-pay | Admitting: Internal Medicine

## 2015-10-16 ENCOUNTER — Encounter (INDEPENDENT_AMBULATORY_CARE_PROVIDER_SITE_OTHER): Payer: Self-pay

## 2015-10-16 DIAGNOSIS — C439 Malignant melanoma of skin, unspecified: Secondary | ICD-10-CM | POA: Diagnosis not present

## 2015-10-16 DIAGNOSIS — G7000 Myasthenia gravis without (acute) exacerbation: Secondary | ICD-10-CM

## 2015-10-16 DIAGNOSIS — E669 Obesity, unspecified: Secondary | ICD-10-CM | POA: Diagnosis not present

## 2015-10-16 DIAGNOSIS — K589 Irritable bowel syndrome without diarrhea: Secondary | ICD-10-CM

## 2015-10-16 DIAGNOSIS — I10 Essential (primary) hypertension: Secondary | ICD-10-CM

## 2015-10-16 DIAGNOSIS — G40909 Epilepsy, unspecified, not intractable, without status epilepticus: Secondary | ICD-10-CM | POA: Diagnosis not present

## 2015-10-16 DIAGNOSIS — R739 Hyperglycemia, unspecified: Secondary | ICD-10-CM

## 2015-10-16 DIAGNOSIS — E7800 Pure hypercholesterolemia, unspecified: Secondary | ICD-10-CM

## 2015-10-16 NOTE — Progress Notes (Signed)
Patient ID: Madison Burns, female   DOB: 07-11-1950, 65 y.o.   MRN: [id number]   Subjective:    Patient ID: Madison Burns, female    DOB: Jun 05, 1950, 50 y.o.   MRN: [id number]  HPI  Patient here for a scheduled follow up.  She is accompanied by her sister.  History obtained from both of them.  She has been doing well.  Eating and drinking.  Discussed diet and exercise.  No chest pain.  No sob.  No acid reflux.  No abdominal pain or cramping.  Bowels stable.  Overall sister feels things are going well and are stable.     Past Medical History:  Diagnosis Date  . GERD (gastroesophageal reflux disease)   . History of seizure disorder   . Hypercholesterolemia   . Hypertension   . Irritable bowel syndrome   . Myasthenia gravis [facility])    Past Surgical History:  Procedure Laterality Date  . ABDOMINAL HYSTERECTOMY    . APPENDECTOMY  1999  . CHOLECYSTECTOMY  2013  . TONSILLECTOMY     Family History  Problem Relation Age of Onset  . CVA Maternal Grandfather   . Colon cancer Maternal Grandfather     stomach/colon  . Heart disease Father     myocardial infarction  . Hyperlipidemia Father   . Hyperlipidemia Sister   . Bone cancer      cousin  . Alzheimer's disease Mother     maternal aunts  . Breast cancer Neg Hx    Social History   Social History  . Marital status: Single    Spouse name: N/A  . Number of children: 0  . Years of education: Special Ed   Occupational History  . Unemployed    Social History Main Topics  . Smoking status: Never Smoker  . Smokeless tobacco: Never Used  . Alcohol use No  . Drug use: No  . Sexual activity: Not Asked   Other Topics Concern  . None   Social History Narrative   Regular exercise-no   Caffeine Use-yes    Outpatient Encounter Prescriptions as of 10/16/2015  Medication Sig  . levothyroxine (SYNTHROID, LEVOTHROID) 50 MCG tablet TAKE ONE (1) TABLET BY MOUTH EVERY DAY  . methotrexate (RHEUMATREX) 2.5 MG tablet TAKE EIGHT (8)  TABLETS BY MOUTH ONCE A WEEK  . omeprazole (PRILOSEC) 20 MG capsule TAKE ONE CAPSULE BY MOUTH DAILY  . pyridostigmine (MESTINON) 60 MG tablet Take one at waking up in AM  8 , and one at lunch and one at 8 PM>  . risperiDONE (RISPERDAL) 2 MG tablet Take 2 mg by mouth at bedtime.  . tolterodine (DETROL Burns) 4 MG 24 hr capsule TAKE ONE CAPSULE BY MOUTH EACH MORNING AS DIRECTED  . [DISCONTINUED] cephALEXin (KEFLEX) 500 MG capsule Take 1 capsule (500 mg total) by mouth 3 (three) times daily.   No facility-administered encounter medications on file as of 10/16/2015.     Review of Systems  Constitutional: Negative for appetite change and unexpected weight change.  HENT: Negative for congestion and sinus pressure.   Respiratory: Negative for cough, chest tightness and shortness of breath.   Cardiovascular: Negative for chest pain, palpitations and leg swelling.  Gastrointestinal: Negative for abdominal pain, diarrhea, nausea and vomiting.  Genitourinary: Negative for difficulty urinating and dysuria.  Musculoskeletal: Negative for back pain and joint swelling.  Skin: Negative for color change and rash.  Neurological: Negative for dizziness, light-headedness and headaches.  Psychiatric/Behavioral: Negative for agitation and dysphoric  mood.       Objective:    Physical Exam  Constitutional: She appears well-developed and well-nourished. No distress.  HENT:  Nose: Nose normal.  Mouth/Throat: Oropharynx is clear and moist.  Neck: Neck supple. No thyromegaly present.  Cardiovascular: Normal rate and regular rhythm.   Pulmonary/Chest: Breath sounds normal. No respiratory distress. She has no wheezes.  Abdominal: Soft. Bowel sounds are normal. There is no tenderness.  Musculoskeletal: She exhibits no edema or tenderness.  Lymphadenopathy:    She has no cervical adenopathy.  Skin: No rash noted. No erythema.  Psychiatric: She has a normal mood and affect. Her behavior is normal.    BP 132/88  (BP Location: Left Arm, Patient Position: Sitting, Cuff Size: Large)   Pulse 84   Temp 98.2 F (36.8 C) (Oral)   Resp 16   Wt 218 lb (98.9 kg)   BMI 38.62 kg/m  Wt Readings from Last 3 Encounters:  10/16/15 218 lb (98.9 kg)  06/14/15 222 lb (100.7 kg)  06/06/15 223 lb (101.2 kg)     Lab Results  Component Value Date   WBC 7.9 06/05/2015   HGB 14.7 06/05/2015   HCT 44.2 06/05/2015   PLT 209.0 06/05/2015   GLUCOSE 113 (H) 10/11/2015   CHOL 212 (H) 10/11/2015   TRIG 209.0 (H) 10/11/2015   HDL 40.10 10/11/2015   LDLDIRECT 145.0 10/11/2015   LDLCALC 125 (H) 02/02/2015   ALT 29 10/11/2015   AST 32 10/11/2015   NA 142 10/11/2015   K 3.8 10/11/2015   CL 106 10/11/2015   CREATININE 0.93 10/11/2015   BUN 11 10/11/2015   CO2 31 10/11/2015   TSH 2.99 10/11/2015   HGBA1C 5.4 10/11/2015    Mm Digital Screening Bilateral  Result Date: 06/22/2015 CLINICAL DATA:  Screening. EXAM: DIGITAL SCREENING BILATERAL MAMMOGRAM WITH CAD COMPARISON:  Previous exam(s). ACR Breast Density Category b: There are scattered areas of fibroglandular density. FINDINGS: There are no findings suspicious for malignancy. Images were processed with CAD. IMPRESSION: No mammographic evidence of malignancy. A result letter of this screening mammogram will be mailed directly to the patient. RECOMMENDATION: Screening mammogram in one year. (Code:SM-B-01Y) BI-RADS CATEGORY  1: Negative. Electronically Signed   By: Ammie Ferrier M.D.   On: 06/22/2015 13:00       Assessment & Plan:   Problem List Items Addressed This Visit    Hypercholesterolemia    Discussed low cholesterol diet and exercise.  Sister refuses cholesterol medication.  Follow lipid panel.       Relevant Orders   Hepatic function panel   Lipid panel   Hyperglycemia    Low carb diet and exercise.  Follow met b and a1c.        Relevant Orders   Hemoglobin A1c   Hypertension    Blood pressure under good control.  Continue same medication  regimen.  Follow pressures.  Follow metabolic panel.        Relevant Orders   Basic metabolic panel   Irritable bowel    Stable.       Melanoma ([place])    Followed by dermatology.       Myasthenia gravis in remission ([place])    Followed by neurology.  On mestinon.        Obesity (BMI 30-39.9)    Diet and exercise.  Follow.       Seizure disorder ([place])    Followed by neurology.  No seizures.  Other Visit Diagnoses   None.      Einar Pheasant, MD

## 2015-10-18 ENCOUNTER — Ambulatory Visit (INDEPENDENT_AMBULATORY_CARE_PROVIDER_SITE_OTHER): Payer: Medicare Other | Admitting: Podiatry

## 2015-10-18 ENCOUNTER — Encounter: Payer: Self-pay | Admitting: Podiatry

## 2015-10-18 DIAGNOSIS — B351 Tinea unguium: Secondary | ICD-10-CM | POA: Diagnosis not present

## 2015-10-18 DIAGNOSIS — M79676 Pain in unspecified toe(s): Secondary | ICD-10-CM | POA: Diagnosis not present

## 2015-10-18 NOTE — Progress Notes (Signed)
Patient ID: Madison Burns, female   DOB: [birth year], 65 y.o.   MRN: [id number]  Subjective: 65 y.o. returns the office today for painful, elongated, thickened toenails which she is unable to trim herself. Denies any redness or drainage around the nails.Denies any acute changes since last appointment and no new complaints today. Denies any systemic complaints such as fevers, chills, nausea, vomiting.   Objective: Awake, Alert, NAD DP/PT pulses palpable, CRT less than 3 seconds Nails hypertrophic, dystrophic, elongated, brittle, discolored 10. There is tenderness overlying the nails 1-5 bilaterally. There is no surrounding erythema or drainage along the nail sites. No open lesions or pre-ulcerative lesions are identified. No other areas of tenderness bilateral lower extremities. No overlying edema, erythema, increased warmth. No pain with calf compression, swelling, warmth, erythema.  Assessment: Patient presents with symptomatic onychomycosis  Plan: -Treatment options including alternatives, risks, complications were discussed -Nails sharply debrided 10 without complication/bleeding. -Discussed daily foot inspection. If there are any changes, to call the office immediately.  -Follow-up in 3 months or sooner if any problems are to arise. In the meantime, encouraged to call the office with any questions, concerns, changes symptoms.  Celesta Gentile, DPM

## 2015-10-21 ENCOUNTER — Encounter: Payer: Self-pay | Admitting: Internal Medicine

## 2015-10-21 NOTE — Assessment & Plan Note (Signed)
Discussed low cholesterol diet and exercise.  Sister refuses cholesterol medication.  Follow lipid panel.

## 2015-10-21 NOTE — Assessment & Plan Note (Signed)
Followed by neurology.  No seizures.

## 2015-10-21 NOTE — Assessment & Plan Note (Signed)
Low carb diet and exercise.  Follow met b and a1c.   

## 2015-10-21 NOTE — Assessment & Plan Note (Signed)
Followed by dermatology

## 2015-10-21 NOTE — Assessment & Plan Note (Signed)
Followed by neurology. On mestinon.   

## 2015-10-21 NOTE — Assessment & Plan Note (Signed)
Diet and exercise.  Follow.  

## 2015-10-21 NOTE — Assessment & Plan Note (Signed)
Stable

## 2015-10-21 NOTE — Assessment & Plan Note (Signed)
Blood pressure under good control.  Continue same medication regimen.  Follow pressures.  Follow metabolic panel.   

## 2015-11-05 ENCOUNTER — Other Ambulatory Visit: Payer: Self-pay | Admitting: Internal Medicine

## 2015-11-05 ENCOUNTER — Other Ambulatory Visit: Payer: Self-pay | Admitting: Neurology

## 2015-12-03 ENCOUNTER — Other Ambulatory Visit: Payer: Self-pay | Admitting: Neurology

## 2015-12-06 ENCOUNTER — Encounter: Payer: Self-pay | Admitting: Nurse Practitioner

## 2015-12-06 ENCOUNTER — Ambulatory Visit (INDEPENDENT_AMBULATORY_CARE_PROVIDER_SITE_OTHER): Payer: Medicare Other | Admitting: Nurse Practitioner

## 2015-12-06 VITALS — BP 136/80 | HR 80 | Ht 63.0 in | Wt 217.2 lb

## 2015-12-06 DIAGNOSIS — Z9989 Dependence on other enabling machines and devices: Secondary | ICD-10-CM

## 2015-12-06 DIAGNOSIS — G40309 Generalized idiopathic epilepsy and epileptic syndromes, not intractable, without status epilepticus: Secondary | ICD-10-CM | POA: Diagnosis not present

## 2015-12-06 DIAGNOSIS — F73 Profound intellectual disabilities: Secondary | ICD-10-CM

## 2015-12-06 DIAGNOSIS — G7000 Myasthenia gravis without (acute) exacerbation: Secondary | ICD-10-CM | POA: Diagnosis not present

## 2015-12-06 DIAGNOSIS — G4733 Obstructive sleep apnea (adult) (pediatric): Secondary | ICD-10-CM | POA: Diagnosis not present

## 2015-12-06 DIAGNOSIS — G40909 Epilepsy, unspecified, not intractable, without status epilepticus: Secondary | ICD-10-CM

## 2015-12-06 MED ORDER — METHOTREXATE 2.5 MG PO TABS: ORAL_TABLET | ORAL | 6 refills | Status: DC

## 2015-12-06 MED ORDER — PYRIDOSTIGMINE BROMIDE 60 MG PO TABS: ORAL_TABLET | ORAL | 6 refills | Status: DC

## 2015-12-06 NOTE — Patient Instructions (Addendum)
CPAP compliance excellent Continue Mestinon and methotrexate for myasthenia gravis will refill Reviewed recent labs from primary care Follow-up in 6 months

## 2015-12-06 NOTE — Progress Notes (Signed)
[facility]  PATIENT: Madison Burns DOB: 1950-04-02   REASON FOR VISIT: Follow-up for obstructive sleep apnea, myasthenia gravis and a history of seizure disorder HISTORY FROM: mom     HISTORY OF PRESENT ILLNESS: HISTORY CDRebecca H Burns is a 65 y.o. female seen here as a referral from Dr. Nicki Reaper for Myasthenia gravis, per special request of the patient.  Madison Burns carries a diagnosis of myasthenia gravis associated with difficulties swallowing, with a decreased level of energy a high degree of fatigue sometimes best interest in activities facial droop eyelid droop sleepiness snoring easy bruising, coughing, trouble swallowing she has a mild hoarseness but not as significant dysphonia and incontinence.  Her family and the patient looking for a maintenance  Neurologist to keep her myasthenia under control. The patient is sleeping 12 hours easily per day, from 10 Pm to 8 AM. She has a droopy right eye.  She missed frequently the 3rd. Mestinon.  She has been followed by Dr. Melrose Nakayama at the [facility] clinic after following Dr. Paulla Dolly for years . The patient was diagnosed in 2011 with myasthenia gravis. She was hospitalized with myasthnic crisis Jan 27 2010- through [facility] 14th 2012. She was send to rehabilitation after that. Her sister noticed a decline in cognitive skills at that time.   Again,  her first treatment had been prednisone and this was just weaned off by September 2015. The patient carries a diagnosis of hypercholesterolemia. She also has a past surgical history of appendectomy in 1999, hysterectomy in 2004 and the melanoma was removed in 2014 from the right side of the neck close to the TM joint.  Madison Burns is single without children,  Lives with her sister now used to live with her mother. She is MRDD, she left school in 7th grade, went to a special facility until her GED, [employer]. She used to work at a [employer] ,later in a vocational trade.   [name]  is taking methotrexate to control them myasthenia she is also on Mestinon 60 mg 3 times a day she takes Detrol for urinary continence at 4 mg in the morning and she takes Risperdal 2 mg I mouth at bedtime. She is also treated for hypothyroidism with levothyroxine at 50 g daily. The methotrexate is given at 8 tablets once a week. Until last September 2015 the patient had been on prednisone. She has not had labs drawn for the last 5 month, while continuing on MTX.  4/19/17Today's visit is solely dedicated to the sleep disorder part of her neurological care  [name] underwent a sleep apnea test as a home sleep test, the AHI was 20.7 the lowest desaturation was 61 with 309 minutes of desaturation. This of course a possibility that in a home sleep test the hypoxemia is overestimated or artifact related but she does have a significant amount of obstructive sleep apnea as well. For this reason and because of her underlying condition of myasthenia gravis the patient was asked to undergo an attended CPAP titration. This took place on 04-19-14 the AHI was 0.3 on CPAP of only 5 cm water. Her SPO2 nadir rose to 91% no added oxygen was needed. I prescribed a ResMed air-fluid P 10 in medium size and an auto CPAP from 4 through 8 cm water. The patient states that she has not received CPAP machine that she had an outstanding bill was advanced home care which may be the reason.  She is on Medicare/ Medicaid  and a  sleep study cannot be older than 6 months to allow her to obtain a machine - so I will have to repeat her sleep study. Her Epworth sleepiness score remains high at 17 points, her depression score at 3.5, fatigue severity questionnaire at 38 points. MRDD - needs assistence with CPAP, certainly would need a desensitization session with [facility] in [place].   Interval history from 06/06/2015 I had the pleasure of seeing Madison Burns today and in time after her last sleep study. She underwent a home sleep test on  February 1 which confirmed that she has apnea at an AHI of 20.7 and oxygen nadir of 61% and a total desaturation time of 309 minutes. The study was followed by an in lab CPAP titration but she reached an AHI of 0.2. 5 cm water appeared to be effective. She is using a ResMed air fit P 10 in very small nasal pillows. The patient also endorsed the Epworth sleepiness score at 12 points now from 17 prior to CPAP use. She had is still in the habit of going to take daytime naps but she feels not as if she can't resist the urge to nap. I have also the first compliance report available today she is 100% compliant has used it every day of the last 30 days and every day over 4 hours. Her average user time is 10 hours 8 minutes, her AHI is 1.1. She is struggling a little bit with allergic rhinitis, but  has been able to still comply with CPAP use.  UPDATE 10/19/2017CM Madison Burns, 65 year old female returns for follow-up with her mother. She has a history of obstructive sleep apnea on CPAP compliance report today is 100% for 30 days average usage 10 hours 6 minutes AHI is 1.1. No leaks. In addition she has myasthenia gravis and is currently on Mestinon and methotrexate. She needs refills. No recent falls. She has no facial weakness no trouble swallowing. She also has a history of seizure disorder but has not had seizures in many years and has been weaned off of her seizure medication. She returns for reevaluation REVIEW OF SYSTEMS: Full 14 system review of systems performed and notable only for those listed, all others are neg:  Constitutional: neg  Cardiovascular: neg Ear/Nose/Throat: neg  Skin: neg Eyes: neg Respiratory: neg Gastroitestinal: neg  Hematology/Lymphatic: neg  Endocrine: neg Musculoskeletal:neg Allergy/Immunology: neg Neurological: neg Psychiatric: neg Sleep : neg   ALLERGIES: Allergies  Allergen Reactions  . Ciprofloxacin Other (See Comments)  . Levofloxacin Other (See Comments)     HOME MEDICATIONS: Outpatient Medications Prior to Visit  Medication Sig Dispense Refill  . levothyroxine (SYNTHROID, LEVOTHROID) 50 MCG tablet TAKE ONE (1) TABLET BY MOUTH EVERY DAY 30 tablet 11  . methotrexate (RHEUMATREX) 2.5 MG tablet TAKE EIGHT (8) TABLETS BY MOUTH ONCE A WEEK 32 tablet 0  . omeprazole (PRILOSEC) 20 MG capsule TAKE ONE CAPSULE BY MOUTH DAILY 30 capsule 3  . pyridostigmine (MESTINON) 60 MG tablet Take one at waking up in AM  8 , and one at lunch and one at 8 PM> 270 tablet 3  . tolterodine (DETROL LA) 4 MG 24 hr capsule TAKE ONE CAPSULE BY MOUTH EACH MORNING AS DIRECTED 90 capsule 3  . risperiDONE (RISPERDAL) 2 MG tablet Take 1 mg by mouth at bedtime.      No facility-administered medications prior to visit.     PAST MEDICAL HISTORY: Past Medical History:  Diagnosis Date  . GERD (gastroesophageal reflux disease)   .  History of seizure disorder   . Hypercholesterolemia   . Hypertension   . Irritable bowel syndrome   . Myasthenia gravis ([place])     PAST SURGICAL HISTORY: Past Surgical History:  Procedure Laterality Date  . ABDOMINAL HYSTERECTOMY    . APPENDECTOMY  1999  . CHOLECYSTECTOMY  2013  . TONSILLECTOMY      FAMILY HISTORY: Family History  Problem Relation Age of Onset  . CVA Maternal Grandfather   . Colon cancer Maternal Grandfather     stomach/colon  . Heart disease Father     myocardial infarction  . Hyperlipidemia Father   . Hyperlipidemia Sister   . Bone cancer      cousin  . Alzheimer's disease Mother     maternal aunts  . Breast cancer Neg Hx     SOCIAL HISTORY: Social History   Social History  . Marital status: Single    Spouse name: N/A  . Number of children: 0  . Years of education: Special Ed   Occupational History  . Unemployed    Social History Main Topics  . Smoking status: Never Smoker  . Smokeless tobacco: Never Used  . Alcohol use No  . Drug use: No  . Sexual activity: Not on file   Other Topics Concern   . Not on file   Social History Narrative   Regular exercise-no   Caffeine Use-yes     PHYSICAL EXAM  Vitals:   12/06/15 1042  BP: 136/80  Pulse: 80  Weight: 217 lb 3.2 oz (98.5 kg)  Height: 5\' 3"  (1.6 m)   Body mass index is 38.48 kg/m.  Generalized: Well developed,  obese female in no acute distress  Head: normocephalic and atraumatic,. Oropharynx benign Mallopatti 4 Neck: Supple, no carotid bruits  Neck circumference 18  Cardiac: Regular rate rhythm, no murmur  Musculoskeletal: No deformity   Neurological examination   Mentation: Alert oriented to time, place, history taking. Attention span and concentration appropriate. Recent and remote memory intact.  Follows all commands speech  with dysphonia and sparse wordflow .   Cranial nerve II-XII: .Pupils were equal round reactive to light extraocular movements were full, visual field were full on confrontational test. mild ptosis on the right  Facial sensation and strength were normal. hearing was intact to finger rubbing bilaterally. Uvula tongue midline. head turning and shoulder shrug were normal and symmetric.Tongue protrusion into cheek strength was normal. Motor: normal bulk and tone, full strength in the BUE, BLE, fine finger movements normal, no pronator drift. No focal weakness Sensory: normal and symmetric to light touch, pinprick, and  vibratory in the upper and lower extremities  Reflexes symmetric upper and lower  plantar responses were flexor bilaterally. Gait and Station: Rising up from seated position without assistance, normal stance,  moderate stride, good arm swing, smooth turning, able to perform tiptoe, and heel walking without difficulty, unable to tandem DIAGNOSTIC DATA (LABS, IMAGING, TESTING) - I reviewed patient records, labs, notes, testing and imaging myself where available.  Lab Results  Component Value Date   WBC 7.9 06/05/2015   HGB 14.7 06/05/2015   HCT 44.2 06/05/2015   MCV 97.7 06/05/2015    PLT 209.0 06/05/2015      Component Value Date/Time   NA 142 10/11/2015 0919   NA 144 04/04/2015 1600   NA 137 02/21/2012 1828   K 3.8 10/11/2015 0919   K 4.3 02/21/2012 1828   CL 106 10/11/2015 0919   CL 104 02/21/2012 1828  CO2 31 10/11/2015 0919   CO2 25 02/21/2012 1828   GLUCOSE 113 (H) 10/11/2015 0919   GLUCOSE 145 (H) 02/21/2012 1828   BUN 11 10/11/2015 0919   BUN 8 04/04/2015 1600   BUN 8 02/21/2012 1828   CREATININE 0.93 10/11/2015 0919   CREATININE 1.14 02/21/2012 1828   CALCIUM 8.7 10/11/2015 0919   CALCIUM 9.0 02/21/2012 1828   PROT 6.3 10/11/2015 0919   PROT 6.0 04/04/2015 1600   PROT 6.4 04/25/2011 0854   ALBUMIN 3.9 10/11/2015 0919   ALBUMIN 4.0 04/04/2015 1600   ALBUMIN 3.4 04/25/2011 0854   AST 32 10/11/2015 0919   AST 18 04/25/2011 0854   ALT 29 10/11/2015 0919   ALT 25 04/25/2011 0854   ALKPHOS 78 10/11/2015 0919   ALKPHOS 45 (L) 04/25/2011 0854   BILITOT 0.5 10/11/2015 0919   BILITOT 0.4 04/04/2015 1600   BILITOT 0.4 04/25/2011 0854   GFRNONAA 80 04/04/2015 1600   GFRNONAA 52 (L) 02/21/2012 1828   GFRAA 92 04/04/2015 1600   GFRAA >60 02/21/2012 1828   Lab Results  Component Value Date   CHOL 212 (H) 10/11/2015   HDL 40.10 10/11/2015   LDLCALC 125 (H) 02/02/2015   LDLDIRECT 145.0 10/11/2015   TRIG 209.0 (H) 10/11/2015   CHOLHDL 5 10/11/2015   Lab Results  Component Value Date   HGBA1C 5.4 10/11/2015   No results found for: [id number] Lab Results  Component Value Date   TSH 2.99 10/11/2015      ASSESSMENT AND PLAN  65 y.o. year old female  has a past medical history of  seizure disorder;  Myasthenia gravis ([place]).and obstructive sleep apnea here to follow-up.  CPAP compliance excellent Continue Mestinon and methotrexate for myasthenia gravis will refill Reviewed recent labs from primary care History of seizure disorder no seizures in many years and has been weaned from seizure medication Follow-up in 6 months [facility], [facility], [facility] [address], [place], [place] [address] [phone]

## 2015-12-06 NOTE — Progress Notes (Signed)
I agree with the assessment and plan as directed by NP .The patient is known to me .   Ajene Carchi, MD  

## 2016-01-18 ENCOUNTER — Ambulatory Visit: Payer: Medicare Other | Admitting: Podiatry

## 2016-01-22 ENCOUNTER — Telehealth: Payer: Self-pay | Admitting: Internal Medicine

## 2016-01-22 DIAGNOSIS — R350 Frequency of micturition: Secondary | ICD-10-CM

## 2016-01-22 NOTE — Telephone Encounter (Signed)
Pt declined to get the AWV. Thank you! °

## 2016-01-24 ENCOUNTER — Ambulatory Visit (INDEPENDENT_AMBULATORY_CARE_PROVIDER_SITE_OTHER): Payer: Medicare Other | Admitting: Podiatry

## 2016-01-24 ENCOUNTER — Ambulatory Visit (INDEPENDENT_AMBULATORY_CARE_PROVIDER_SITE_OTHER): Payer: Medicare Other

## 2016-01-24 DIAGNOSIS — L608 Other nail disorders: Secondary | ICD-10-CM

## 2016-01-24 DIAGNOSIS — L603 Nail dystrophy: Secondary | ICD-10-CM | POA: Diagnosis not present

## 2016-01-24 DIAGNOSIS — M7752 Other enthesopathy of left foot: Secondary | ICD-10-CM

## 2016-01-24 DIAGNOSIS — M25572 Pain in left ankle and joints of left foot: Secondary | ICD-10-CM | POA: Diagnosis not present

## 2016-01-24 DIAGNOSIS — B351 Tinea unguium: Secondary | ICD-10-CM | POA: Diagnosis not present

## 2016-01-24 DIAGNOSIS — M7750 Other enthesopathy of unspecified foot: Secondary | ICD-10-CM | POA: Diagnosis not present

## 2016-01-24 DIAGNOSIS — M65972 Unspecified synovitis and tenosynovitis, left ankle and foot: Secondary | ICD-10-CM

## 2016-01-24 DIAGNOSIS — M79609 Pain in unspecified limb: Secondary | ICD-10-CM

## 2016-01-24 DIAGNOSIS — G8929 Other chronic pain: Secondary | ICD-10-CM

## 2016-01-24 DIAGNOSIS — M659 Synovitis and tenosynovitis, unspecified: Secondary | ICD-10-CM

## 2016-01-24 DIAGNOSIS — R52 Pain, unspecified: Secondary | ICD-10-CM

## 2016-01-24 MED ORDER — BETAMETHASONE SOD PHOS & ACET 6 (3-3) MG/ML IJ SUSP: 3.0000 mg | Freq: Once | INTRAMUSCULAR | Status: DC

## 2016-01-24 NOTE — Progress Notes (Signed)
SUBJECTIVE Patient  presents to office today complaining of elongated, thickened nails. Pain while ambulating in shoes. Patient is unable to trim their own nails.  Patient also has a new complaint today of left ankle pain. Patient's sister states that she does notice that the patient limps on her left foot and ankle. Patient presents today for further treatment and evaluation  OBJECTIVE General Patient is awake, alert, and oriented x 3 and in no acute distress. Derm Skin is dry and supple bilateral. Negative open lesions or macerations. Remaining integument unremarkable. Nails are tender, long, thickened and dystrophic with subungual debris, consistent with onychomycosis, 1-5 bilateral. No signs of infection noted. Vasc  DP and PT pedal pulses palpable bilaterally. Temperature gradient within normal limits.  Neuro Epicritic and protective threshold sensation diminished bilaterally.  Musculoskeletal Exam pain on palpation noted to the medial aspect of the patient's left ankle joint. Mild pain on range of motion noted as well.  ASSESSMENT 1. Onychodystrophic nails 1-5 bilateral with hyperkeratosis of nails.  2. Onychomycosis of nail due to dermatophyte bilateral 3. Pain in foot bilateral 4. Pain in left ankle joint 5. Capsulitis left ankle joint  PLAN OF CARE 1. Patient evaluated today.  2. Instructed to maintain good pedal hygiene and foot care.  3. Mechanical debridement of nails 1-5 bilaterally performed using a nail nipper. Filed with dremel without incident.  4. Injection of 0.5 mL Celestone Soluspan injected in the patient's left ankle joint 5. Discuss conservative modalities to alleviate ankle joint symptoms 6. Return to clinic in 3 mos.    Edrick Kins, DPM

## 2016-01-28 ENCOUNTER — Other Ambulatory Visit: Payer: Self-pay | Admitting: Nurse Practitioner

## 2016-02-13 ENCOUNTER — Other Ambulatory Visit (INDEPENDENT_AMBULATORY_CARE_PROVIDER_SITE_OTHER): Payer: Medicare Other

## 2016-02-13 DIAGNOSIS — R350 Frequency of micturition: Secondary | ICD-10-CM | POA: Diagnosis not present

## 2016-02-13 DIAGNOSIS — R7989 Other specified abnormal findings of blood chemistry: Secondary | ICD-10-CM | POA: Diagnosis not present

## 2016-02-13 DIAGNOSIS — I10 Essential (primary) hypertension: Secondary | ICD-10-CM | POA: Diagnosis not present

## 2016-02-13 DIAGNOSIS — E7800 Pure hypercholesterolemia, unspecified: Secondary | ICD-10-CM

## 2016-02-13 DIAGNOSIS — R739 Hyperglycemia, unspecified: Secondary | ICD-10-CM | POA: Diagnosis not present

## 2016-02-13 LAB — BASIC METABOLIC PANEL
BUN: 8 mg/dL (ref 6–23)
CO2: 29 meq/L (ref 19–32)
Calcium: 8.3 mg/dL — ABNORMAL LOW (ref 8.4–10.5)
Chloride: 107 mEq/L (ref 96–112)
Creatinine, Ser: 0.89 mg/dL (ref 0.40–1.20)
GFR: 67.72 mL/min (ref >60.00)
GLUCOSE: 108 mg/dL — AB (ref 70–99)
POTASSIUM: 4 meq/L (ref 3.5–5.1)
SODIUM: 142 meq/L (ref 135–145)

## 2016-02-13 LAB — URINALYSIS, ROUTINE W REFLEX MICROSCOPIC
Bilirubin Urine: NEGATIVE
Hgb urine dipstick: NEGATIVE
KETONES UR: NEGATIVE
Nitrite: NEGATIVE
PH: 6.5 (ref 5.0–8.0)
SPECIFIC GRAVITY, URINE: 1.01 (ref 1.000–1.030)
TOTAL PROTEIN, URINE-UPE24: NEGATIVE
URINE GLUCOSE: NEGATIVE
UROBILINOGEN UA: 0.2 (ref 0.0–1.0)

## 2016-02-13 LAB — HEPATIC FUNCTION PANEL
ALBUMIN: 3.7 g/dL (ref 3.5–5.2)
ALT: 15 U/L (ref 0–35)
AST: 18 U/L (ref 0–37)
Alkaline Phosphatase: 85 U/L (ref 39–117)
Bilirubin, Direct: 0.1 mg/dL (ref 0.0–0.3)
TOTAL PROTEIN: 5.7 g/dL — AB (ref 6.0–8.3)
Total Bilirubin: 0.4 mg/dL (ref 0.2–1.2)

## 2016-02-13 LAB — LIPID PANEL
CHOLESTEROL: 193 mg/dL (ref 0–200)
HDL: 39.2 mg/dL (ref >39.00)
NonHDL: 153.57
Total CHOL/HDL Ratio: 5
Triglycerides: 203 mg/dL — ABNORMAL HIGH (ref 0.0–149.0)
VLDL: 40.6 mg/dL — ABNORMAL HIGH (ref 0.0–40.0)

## 2016-02-13 LAB — HEMOGLOBIN A1C: HEMOGLOBIN A1C: 5.4 % (ref 4.6–6.5)

## 2016-02-13 LAB — LDL CHOLESTEROL, DIRECT: Direct LDL: 122 mg/dL

## 2016-02-13 NOTE — Telephone Encounter (Signed)
I have placed the order for urinalysis and culture.  Thanks

## 2016-02-13 NOTE — Telephone Encounter (Signed)
I can place an order.  Was she having any urinary symptoms?  Just need a little more information so that I know what to order, what diagnosis to use, etc.  Thanks.

## 2016-02-13 NOTE — Telephone Encounter (Signed)
Per family member, pt is coming back on Friday for follow up appt and stated that pt was going to need to leave a urine specimen and wanted to know if she could leave the specimen today. Pt left urine specimen today, would you like to order anything for that for appt on Friday?

## 2016-02-13 NOTE — Telephone Encounter (Signed)
Yes, she is having frequent urination onset for several days. Per family member denies hematuria.

## 2016-02-13 NOTE — Addendum Note (Signed)
Addended by: Arby Barrette on: 02/13/2016 11:09 AM   Modules accepted: Orders

## 2016-02-15 ENCOUNTER — Encounter: Payer: Self-pay | Admitting: Internal Medicine

## 2016-02-15 ENCOUNTER — Ambulatory Visit (INDEPENDENT_AMBULATORY_CARE_PROVIDER_SITE_OTHER): Payer: Medicare Other | Admitting: Internal Medicine

## 2016-02-15 VITALS — BP 130/84 | HR 88 | Temp 97.4°F | Ht 63.0 in | Wt 225.6 lb

## 2016-02-15 DIAGNOSIS — E669 Obesity, unspecified: Secondary | ICD-10-CM | POA: Diagnosis not present

## 2016-02-15 DIAGNOSIS — G40909 Epilepsy, unspecified, not intractable, without status epilepticus: Secondary | ICD-10-CM | POA: Diagnosis not present

## 2016-02-15 DIAGNOSIS — R350 Frequency of micturition: Secondary | ICD-10-CM | POA: Diagnosis not present

## 2016-02-15 DIAGNOSIS — G7000 Myasthenia gravis without (acute) exacerbation: Secondary | ICD-10-CM | POA: Diagnosis not present

## 2016-02-15 DIAGNOSIS — C439 Malignant melanoma of skin, unspecified: Secondary | ICD-10-CM

## 2016-02-15 DIAGNOSIS — R739 Hyperglycemia, unspecified: Secondary | ICD-10-CM

## 2016-02-15 DIAGNOSIS — E7800 Pure hypercholesterolemia, unspecified: Secondary | ICD-10-CM

## 2016-02-15 DIAGNOSIS — I10 Essential (primary) hypertension: Secondary | ICD-10-CM

## 2016-02-15 LAB — CULTURE, URINE COMPREHENSIVE: Colony Count: >=100000

## 2016-02-15 MED ORDER — NITROFURANTOIN MONOHYD MACRO 100 MG PO CAPS: 100.0000 mg | ORAL_CAPSULE | Freq: Two times a day (BID) | ORAL | 0 refills | Status: DC

## 2016-02-15 NOTE — Progress Notes (Signed)
Pre visit review using our clinic review tool, if applicable. No additional management support is needed unless otherwise documented below in the visit note. 

## 2016-02-15 NOTE — Progress Notes (Signed)
Patient ID: Madison Burns, female   DOB: 03-Sep-1950, 65 y.o.   MRN: [id number]   Subjective:    Patient ID: Madison Burns, female    DOB: Dec 26, 1950, 38 y.o.   MRN: [id number]  HPI  Patient here for a scheduled follow up.  She is accompanied by her sister.  History obtained from mostly her sister.  She is doing well.  They are trying to watch her diet.  No chest pain.  No sob.  No acid reflux.  No abdominal pain or cramping.  Bowels stable.  Some increased urinary frequency.  Recent urine - positive for infection.  Discussed treatment.  Discussed lab results.  Previous inner ear itching.  Better now.     Past Medical History:  Diagnosis Date  . GERD (gastroesophageal reflux disease)   . History of seizure disorder   . Hypercholesterolemia   . Hypertension   . Irritable bowel syndrome   . Myasthenia gravis [facility])    Past Surgical History:  Procedure Laterality Date  . ABDOMINAL HYSTERECTOMY    . APPENDECTOMY  1999  . CHOLECYSTECTOMY  2013  . TONSILLECTOMY     Family History  Problem Relation Age of Onset  . CVA Maternal Grandfather   . Colon cancer Maternal Grandfather     stomach/colon  . Heart disease Father     myocardial infarction  . Hyperlipidemia Father   . Hyperlipidemia Sister   . Bone cancer      cousin  . Alzheimer's disease Mother     maternal aunts  . Breast cancer Neg Hx    Social History   Social History  . Marital status: Single    Spouse name: N/A  . Number of children: 0  . Years of education: Special Ed   Occupational History  . Unemployed    Social History Main Topics  . Smoking status: Never Smoker  . Smokeless tobacco: Never Used  . Alcohol use No  . Drug use: No  . Sexual activity: Not Asked   Other Topics Concern  . None   Social History Narrative   Regular exercise-no   Caffeine Use-yes    Outpatient Encounter Prescriptions as of 02/15/2016  Medication Sig  . levothyroxine (SYNTHROID, LEVOTHROID) 50 MCG tablet TAKE ONE (1)  TABLET BY MOUTH EVERY DAY  . methotrexate (RHEUMATREX) 2.5 MG tablet TAKE EIGHT (8) TABLETS BY MOUTH ONCE A WEEK  . omeprazole (PRILOSEC) 20 MG capsule TAKE ONE CAPSULE BY MOUTH DAILY  . pyridostigmine (MESTINON) 60 MG tablet Take one at waking up in AM  8 , and one at lunch and one at 8 PM>  . risperiDONE (RISPERDAL) 1 MG tablet Take 1 mg by mouth at bedtime.  . tolterodine (DETROL Burns) 4 MG 24 hr capsule TAKE ONE CAPSULE BY MOUTH EACH MORNING AS DIRECTED  . nitrofurantoin, macrocrystal-monohydrate, (MACROBID) 100 MG capsule Take 1 capsule (100 mg total) by mouth 2 (two) times daily.   Facility-Administered Encounter Medications as of 02/15/2016  Medication  . betamethasone acetate-betamethasone sodium phosphate (CELESTONE) injection 3 mg    Review of Systems  Constitutional: Negative for appetite change and unexpected weight change.  HENT: Negative for congestion and sinus pressure.   Respiratory: Negative for cough, chest tightness and shortness of breath.   Cardiovascular: Negative for chest pain, palpitations and leg swelling.  Gastrointestinal: Negative for abdominal pain, diarrhea, nausea and vomiting.  Genitourinary: Positive for frequency. Negative for difficulty urinating.  Musculoskeletal: Negative for back pain and joint swelling.  Skin: Negative for color change and rash.  Neurological: Negative for dizziness, light-headedness and headaches.  Psychiatric/Behavioral: Negative for agitation and dysphoric mood.       Objective:    Physical Exam  Constitutional: She appears well-developed and well-nourished. No distress.  HENT:  Nose: Nose normal.  Mouth/Throat: Oropharynx is clear and moist.  Neck: Neck supple. No thyromegaly present.  Cardiovascular: Normal rate and regular rhythm.   Pulmonary/Chest: Breath sounds normal. No respiratory distress. She has no wheezes.  Abdominal: Soft. Bowel sounds are normal. There is no tenderness.  Musculoskeletal: She exhibits no  edema or tenderness.  Lymphadenopathy:    She has no cervical adenopathy.  Skin: No rash noted. No erythema.  Psychiatric: She has a normal mood and affect. Her behavior is normal.    BP 130/84   Pulse 88   Temp 97.4 F (36.3 C) (Oral)   Ht _0  (1.6 m)   Wt 225 lb 9.6 oz (102.3 kg)   SpO2 96%   BMI 39.96 kg/m  Wt Readings from Last 3 Encounters:  02/15/16 225 lb 9.6 oz (102.3 kg)  12/06/15 217 lb 3.2 oz (98.5 kg)  10/16/15 218 lb (98.9 kg)     Lab Results  Component Value Date   WBC 7.9 06/05/2015   HGB 14.7 06/05/2015   HCT 44.2 06/05/2015   PLT 209.0 06/05/2015   GLUCOSE 108 (H) 02/13/2016   CHOL 193 02/13/2016   TRIG 203.0 (H) 02/13/2016   HDL 39.20 02/13/2016   LDLDIRECT 122.0 02/13/2016   LDLCALC 125 (H) 02/02/2015   ALT 15 02/13/2016   AST 18 02/13/2016   NA 142 02/13/2016   K 4.0 02/13/2016   CL 107 02/13/2016   CREATININE 0.89 02/13/2016   BUN 8 02/13/2016   CO2 29 02/13/2016   TSH 2.99 10/11/2015   HGBA1C 5.4 02/13/2016    Mm Digital Screening Bilateral  Result Date: 06/22/2015 CLINICAL DATA:  Screening. EXAM: DIGITAL SCREENING BILATERAL MAMMOGRAM WITH CAD COMPARISON:  Previous exam(s). ACR Breast Density Category b: There are scattered areas of fibroglandular density. FINDINGS: There are no findings suspicious for malignancy. Images were processed with CAD. IMPRESSION: No mammographic evidence of malignancy. A result letter of this screening mammogram will be mailed directly to the patient. RECOMMENDATION: Screening mammogram in one year. (Code:SM-B-01Y) BI-RADS CATEGORY  1: Negative. Electronically Signed   By: Ammie Ferrier M.D.   On: 06/22/2015 13:00       Assessment & Plan:   Problem List Items Addressed This Visit    Hypercholesterolemia    Low cholesterol diet and exercise.  Follow lipid panel.        Relevant Orders   Hepatic function panel   Lipid panel   Hyperglycemia    Low carb diet and exercise.  Follow met b and a1c.   Lab  Results  Component Value Date   HGBA1C 5.4 02/13/2016        Relevant Orders   Hemoglobin A1c   Hypertension    Blood pressure under good control.  Continue same medication regimen.  Follow pressures.  Follow metabolic panel.        Relevant Orders   Basic metabolic panel   Melanoma ([place])    Followed by dermatology.        Myasthenia gravis in remission ([place])    Followed by neurology.  On mestinon.  Follow.        Obesity (BMI 30-39.9)    Diet and exercise.  Follow.  Seizure disorder ([place])    Followed by neurology.  No seizures.  Stable.        Other Visit Diagnoses    Urinary frequency    -  Primary   urine culture positive for infection.  treat with macrobid.  follow.        Einar Pheasant, MD

## 2016-02-18 ENCOUNTER — Encounter: Payer: Self-pay | Admitting: Internal Medicine

## 2016-02-18 NOTE — Assessment & Plan Note (Signed)
Blood pressure under good control.  Continue same medication regimen.  Follow pressures.  Follow metabolic panel.   

## 2016-02-18 NOTE — Assessment & Plan Note (Signed)
Diet and exercise.  Follow.  

## 2016-02-18 NOTE — Assessment & Plan Note (Signed)
Followed by dermatology

## 2016-02-18 NOTE — Assessment & Plan Note (Signed)
Low cholesterol diet and exercise.  Follow lipid panel.   

## 2016-02-18 NOTE — Assessment & Plan Note (Signed)
Followed by neurology.  No seizures.  Stable.

## 2016-02-18 NOTE — Assessment & Plan Note (Signed)
Followed by neurology.  On mestinon.  Follow.

## 2016-02-18 NOTE — Assessment & Plan Note (Signed)
Low carb diet and exercise.  Follow met b and a1c.   Lab Results  Component Value Date   HGBA1C 5.4 02/13/2016

## 2016-03-17 ENCOUNTER — Other Ambulatory Visit: Payer: Self-pay | Admitting: Internal Medicine

## 2016-04-01 ENCOUNTER — Other Ambulatory Visit: Payer: Self-pay | Admitting: Internal Medicine

## 2016-04-22 ENCOUNTER — Other Ambulatory Visit: Payer: Self-pay | Admitting: Neurology

## 2016-04-22 DIAGNOSIS — F73 Profound intellectual disabilities: Secondary | ICD-10-CM

## 2016-04-22 DIAGNOSIS — G40309 Generalized idiopathic epilepsy and epileptic syndromes, not intractable, without status epilepticus: Secondary | ICD-10-CM

## 2016-04-22 DIAGNOSIS — G7000 Myasthenia gravis without (acute) exacerbation: Secondary | ICD-10-CM

## 2016-04-22 DIAGNOSIS — G4733 Obstructive sleep apnea (adult) (pediatric): Secondary | ICD-10-CM

## 2016-04-23 NOTE — Telephone Encounter (Signed)
I called [facility] in [place]. They should have an RX for mestinon on file from 12/06/2015 from [place], NP. Rob verified that they do, and this refill request does not need to be addressed.

## 2016-04-24 ENCOUNTER — Ambulatory Visit: Payer: Medicare Other | Admitting: Podiatry

## 2016-04-28 ENCOUNTER — Ambulatory Visit: Payer: Medicare Other | Admitting: Podiatry

## 2016-04-30 DIAGNOSIS — G7000 Myasthenia gravis without (acute) exacerbation: Secondary | ICD-10-CM | POA: Diagnosis not present

## 2016-04-30 DIAGNOSIS — G4710 Hypersomnia, unspecified: Secondary | ICD-10-CM | POA: Diagnosis not present

## 2016-04-30 DIAGNOSIS — I10 Essential (primary) hypertension: Secondary | ICD-10-CM | POA: Diagnosis not present

## 2016-04-30 DIAGNOSIS — E7800 Pure hypercholesterolemia, unspecified: Secondary | ICD-10-CM | POA: Diagnosis not present

## 2016-04-30 DIAGNOSIS — G4733 Obstructive sleep apnea (adult) (pediatric): Secondary | ICD-10-CM | POA: Diagnosis not present

## 2016-04-30 DIAGNOSIS — G4730 Sleep apnea, unspecified: Secondary | ICD-10-CM | POA: Diagnosis not present

## 2016-05-19 ENCOUNTER — Ambulatory Visit (INDEPENDENT_AMBULATORY_CARE_PROVIDER_SITE_OTHER): Payer: Medicare Other | Admitting: Podiatry

## 2016-05-19 ENCOUNTER — Encounter: Payer: Self-pay | Admitting: Podiatry

## 2016-05-19 DIAGNOSIS — B351 Tinea unguium: Secondary | ICD-10-CM | POA: Diagnosis not present

## 2016-05-19 DIAGNOSIS — M79676 Pain in unspecified toe(s): Secondary | ICD-10-CM

## 2016-05-19 NOTE — Progress Notes (Signed)
Complaint:  Visit Type: Patient returns to my office for continued preventative foot care services. Complaint: Patient states" my nails have grown long and thick and become painful to walk and wear shoes" . The patient presents for preventative foot care services. No changes to ROS  Podiatric Exam: Vascular: dorsalis pedis and posterior tibial pulses are palpable bilateral. Capillary return is immediate. Temperature gradient is WNL. Skin turgor WNL  Sensorium: Normal Semmes Weinstein monofilament test. Normal tactile sensation bilaterally. Nail Exam: Pt has thick disfigured discolored nails with subungual debris noted bilateral entire nail hallux through fifth toenails Ulcer Exam: There is no evidence of ulcer or pre-ulcerative changes or infection. Orthopedic Exam: Muscle tone and strength are WNL. No limitations in general ROM. No crepitus or effusions noted. Foot type and digits show no abnormalities. Bony prominences are unremarkable. Skin: No Porokeratosis. No infection or ulcers  Diagnosis:  Onychomycosis, , Pain in right toe, pain in left toes  Treatment & Plan Procedures and Treatment: Consent by patient was obtained for treatment procedures. The patient understood the discussion of treatment and procedures well. All questions were answered thoroughly reviewed. Debridement of mycotic and hypertrophic toenails, 1 through 5 bilateral and clearing of subungual debris. No ulceration, no infection noted.  Return Visit-Office Procedure: Patient instructed to return to the office for a follow up visit 3 months   for continued evaluation and treatment.    Romelia Bromell DPM 

## 2016-05-30 ENCOUNTER — Telehealth: Payer: Self-pay | Admitting: *Deleted

## 2016-05-30 NOTE — Telephone Encounter (Addendum)
Madison Burns advised of below and verbalized an understanding per [facility]

## 2016-05-30 NOTE — Telephone Encounter (Signed)
Patient sister reported that patient has a terrible cough and mucus . Patient has been exposed to a cold. Madison Burns (sister) has a cough and green mucus, she reported to suspect that pt mucus is green as well, however she could not get her to cough up the mucus to verify the color.  Pt's sister has requested a call to discuss treatment of a Rx . FYI Pt just lost mother  Please contact Madison Burns [phone]

## 2016-05-30 NOTE — Telephone Encounter (Signed)
Reason for call: cough, exposed to cold sister [name] Symptoms: cough loose suspect mucus is green  , non productive, runny nose, fever  100 at night ,  Head congestion , Duration 3 days  Medications: tylenol  Please advise  FYI . Mom just passed away , who will be buried tomorrow  Spoke with [place] sister , who is sick as well

## 2016-05-30 NOTE — Telephone Encounter (Signed)
Given 3 days of cough and congestion, would recommend robitussin DM twice a day as needed.  Also, if nasal congestion and drainage, nasacort nasal spray 2 sprays each nostril one time per day.  Do this in the evening.  If symptoms persist or worsen, she will need to be seen.

## 2016-06-05 ENCOUNTER — Ambulatory Visit: Payer: Medicare Other | Admitting: Nurse Practitioner

## 2016-06-10 ENCOUNTER — Telehealth: Payer: Self-pay | Admitting: Internal Medicine

## 2016-06-10 ENCOUNTER — Ambulatory Visit: Payer: Medicare Other | Admitting: Family

## 2016-06-10 ENCOUNTER — Encounter: Payer: Self-pay | Admitting: Nurse Practitioner

## 2016-06-10 NOTE — Telephone Encounter (Signed)
FYI - Pt sister called and cancelled appt. Pt sister is having diarrhea and cannot drive pt to appt.

## 2016-06-10 NOTE — Telephone Encounter (Signed)
FYI

## 2016-06-13 ENCOUNTER — Ambulatory Visit: Payer: Medicare Other

## 2016-06-13 DIAGNOSIS — J069 Acute upper respiratory infection, unspecified: Secondary | ICD-10-CM | POA: Diagnosis not present

## 2016-06-17 ENCOUNTER — Other Ambulatory Visit (INDEPENDENT_AMBULATORY_CARE_PROVIDER_SITE_OTHER): Payer: Medicare Other

## 2016-06-17 DIAGNOSIS — E7800 Pure hypercholesterolemia, unspecified: Secondary | ICD-10-CM

## 2016-06-17 DIAGNOSIS — R739 Hyperglycemia, unspecified: Secondary | ICD-10-CM

## 2016-06-17 DIAGNOSIS — I10 Essential (primary) hypertension: Secondary | ICD-10-CM

## 2016-06-17 LAB — HEMOGLOBIN A1C: HEMOGLOBIN A1C: 5.7 % (ref 4.6–6.5)

## 2016-06-17 LAB — BASIC METABOLIC PANEL
BUN: 11 mg/dL (ref 6–23)
CALCIUM: 8.9 mg/dL (ref 8.4–10.5)
CO2: 31 meq/L (ref 19–32)
Chloride: 106 mEq/L (ref 96–112)
Creatinine, Ser: 0.88 mg/dL (ref 0.40–1.20)
GFR: 68.54 mL/min (ref >60.00)
GLUCOSE: 104 mg/dL — AB (ref 70–99)
Potassium: 3.7 mEq/L (ref 3.5–5.1)
SODIUM: 140 meq/L (ref 135–145)

## 2016-06-17 LAB — HEPATIC FUNCTION PANEL
ALBUMIN: 3.8 g/dL (ref 3.5–5.2)
ALT: 20 U/L (ref 0–35)
AST: 24 U/L (ref 0–37)
Alkaline Phosphatase: 67 U/L (ref 39–117)
Bilirubin, Direct: 0.2 mg/dL (ref 0.0–0.3)
Total Bilirubin: 0.7 mg/dL (ref 0.2–1.2)
Total Protein: 6.5 g/dL (ref 6.0–8.3)

## 2016-06-17 LAB — LIPID PANEL
CHOLESTEROL: 189 mg/dL (ref 0–200)
HDL: 44.6 mg/dL (ref >39.00)
LDL Cholesterol: 109 mg/dL — ABNORMAL HIGH (ref 0–99)
NonHDL: 144.24
TRIGLYCERIDES: 175 mg/dL — AB (ref 0.0–149.0)
Total CHOL/HDL Ratio: 4
VLDL: 35 mg/dL (ref 0.0–40.0)

## 2016-06-18 ENCOUNTER — Telehealth: Payer: Self-pay

## 2016-06-18 NOTE — Telephone Encounter (Signed)
-----   Message from Einar Pheasant, MD sent at 06/18/2016  5:43 AM EDT ----- Notify pts sister (nancy) that [name]'s cholesterol has improved.  Overall sugar control ok.  Kidney function tests and liver function tests are wnl.

## 2016-06-18 NOTE — Telephone Encounter (Signed)
Left message to return call to our office.  

## 2016-06-19 ENCOUNTER — Encounter: Payer: Medicare Other | Admitting: Internal Medicine

## 2016-06-20 NOTE — Telephone Encounter (Signed)
Left message to return call to our office.  

## 2016-06-26 ENCOUNTER — Other Ambulatory Visit: Payer: Self-pay | Admitting: Internal Medicine

## 2016-06-26 NOTE — Telephone Encounter (Signed)
Sister informed no questions

## 2016-06-30 ENCOUNTER — Ambulatory Visit (INDEPENDENT_AMBULATORY_CARE_PROVIDER_SITE_OTHER): Payer: Medicare Other | Admitting: Internal Medicine

## 2016-06-30 ENCOUNTER — Encounter: Payer: Self-pay | Admitting: Internal Medicine

## 2016-06-30 VITALS — BP 140/82 | HR 100 | Temp 98.6°F | Resp 12 | Ht 63.0 in | Wt 217.2 lb

## 2016-06-30 DIAGNOSIS — G4733 Obstructive sleep apnea (adult) (pediatric): Secondary | ICD-10-CM

## 2016-06-30 DIAGNOSIS — C439 Malignant melanoma of skin, unspecified: Secondary | ICD-10-CM | POA: Diagnosis not present

## 2016-06-30 DIAGNOSIS — R739 Hyperglycemia, unspecified: Secondary | ICD-10-CM | POA: Diagnosis not present

## 2016-06-30 DIAGNOSIS — Z9989 Dependence on other enabling machines and devices: Secondary | ICD-10-CM | POA: Diagnosis not present

## 2016-06-30 DIAGNOSIS — Z1239 Encounter for other screening for malignant neoplasm of breast: Secondary | ICD-10-CM

## 2016-06-30 DIAGNOSIS — Z0000 Encounter for general adult medical examination without abnormal findings: Secondary | ICD-10-CM

## 2016-06-30 DIAGNOSIS — I10 Essential (primary) hypertension: Secondary | ICD-10-CM | POA: Diagnosis not present

## 2016-06-30 DIAGNOSIS — N644 Mastodynia: Secondary | ICD-10-CM

## 2016-06-30 DIAGNOSIS — G7001 Myasthenia gravis with (acute) exacerbation: Secondary | ICD-10-CM | POA: Diagnosis not present

## 2016-06-30 DIAGNOSIS — Z1231 Encounter for screening mammogram for malignant neoplasm of breast: Secondary | ICD-10-CM | POA: Diagnosis not present

## 2016-06-30 DIAGNOSIS — G40909 Epilepsy, unspecified, not intractable, without status epilepticus: Secondary | ICD-10-CM

## 2016-06-30 DIAGNOSIS — E7800 Pure hypercholesterolemia, unspecified: Secondary | ICD-10-CM

## 2016-06-30 DIAGNOSIS — E669 Obesity, unspecified: Secondary | ICD-10-CM

## 2016-06-30 MED ORDER — OMEPRAZOLE 20 MG PO CPDR: 20.0000 mg | DELAYED_RELEASE_CAPSULE | Freq: Every day | ORAL | 3 refills | Status: DC

## 2016-06-30 MED ORDER — LEVOTHYROXINE SODIUM 50 MCG PO TABS
ORAL_TABLET | ORAL | 3 refills | Status: DC
Start: 2016-06-30 — End: 2016-11-12

## 2016-06-30 NOTE — Assessment & Plan Note (Addendum)
Physical today 06/30/16.  Mammogram 06/22/15 -Birads I.  Schedule f/u mammogram.  See note.  Declines further colonoscopy - per sister.

## 2016-06-30 NOTE — Progress Notes (Signed)
Patient ID: Madison Burns, female   DOB: 10-31-1950, 66 y.o.   MRN: [id number]   Subjective:    Patient ID: Madison Burns, female    DOB: 07/14/1950, 66 y.o.   MRN: [id number]  HPI  Patient with past history of hypercholesterolemia, GERD, hypertension, IBS and myasthenia gravis.  She comes in today to follow up on these issues as well as for a complete physical exam.  She is accompanied by her sister.  History obtained from both of them.  Her mother just recently passed away.  She is coping relatively well (per sister).  She denies any chest pain. Does report some right breast pain.  No acid reflux.  No abdominal pain.  Bowels moving.  Breathing stable.  Saw Dr Ubaldo Glassing. Recommended continuing CPAP.  Felt stable.  Was treated for URI recently.  Improved.  Overall stable.      Past Medical History:  Diagnosis Date  . GERD (gastroesophageal reflux disease)   . History of seizure disorder   . Hypercholesterolemia   . Hypertension   . Irritable bowel syndrome   . Myasthenia gravis [facility])    Past Surgical History:  Procedure Laterality Date  . ABDOMINAL HYSTERECTOMY    . APPENDECTOMY  1999  . CHOLECYSTECTOMY  2013  . TONSILLECTOMY     Family History  Problem Relation Age of Onset  . CVA Maternal Grandfather   . Colon cancer Maternal Grandfather        stomach/colon  . Heart disease Father        myocardial infarction  . Hyperlipidemia Father   . Hyperlipidemia Sister   . Bone cancer Unknown        cousin  . Alzheimer's disease Mother        maternal aunts  . Breast cancer Neg Hx    Social History   Social History  . Marital status: Single    Spouse name: N/A  . Number of children: 0  . Years of education: Special Ed   Occupational History  . Unemployed    Social History Main Topics  . Smoking status: Never Smoker  . Smokeless tobacco: Never Used  . Alcohol use No  . Drug use: No  . Sexual activity: Not Asked   Other Topics Concern  . None   Social History  Narrative   Regular exercise-no   Caffeine Use-yes    Outpatient Encounter Prescriptions as of 06/30/2016  Medication Sig  . levothyroxine (SYNTHROID, LEVOTHROID) 50 MCG tablet TAKE ONE (1) TABLET BY MOUTH EVERY DAY  . methotrexate (RHEUMATREX) 2.5 MG tablet TAKE EIGHT (8) TABLETS BY MOUTH ONCE A WEEK  . nitrofurantoin, macrocrystal-monohydrate, (MACROBID) 100 MG capsule Take 1 capsule (100 mg total) by mouth 2 (two) times daily.  [name] omeprazole (PRILOSEC) 20 MG capsule Take 1 capsule (20 mg total) by mouth daily.  [name] pyridostigmine (MESTINON) 60 MG tablet Take one at waking up in AM  8 , and one at lunch and one at 8 PM>  . risperiDONE (RISPERDAL) 1 MG tablet Take 1 mg by mouth at bedtime.  . tolterodine (DETROL Burns) 4 MG 24 hr capsule TAKE 1 CAPSULE BY MOUTH EVERY MORNING  . [DISCONTINUED] levothyroxine (SYNTHROID, LEVOTHROID) 50 MCG tablet TAKE ONE (1) TABLET BY MOUTH EVERY DAY  . [DISCONTINUED] omeprazole (PRILOSEC) 20 MG capsule TAKE ONE CAPSULE BY MOUTH DAILY   Facility-Administered Encounter Medications as of 06/30/2016  Medication  . betamethasone acetate-betamethasone sodium phosphate (CELESTONE) injection 3 mg    Review  of Systems  Constitutional: Negative for appetite change and unexpected weight change.  HENT: Negative for congestion and sinus pressure.   Eyes: Negative for pain and visual disturbance.  Respiratory: Negative for cough, chest tightness and shortness of breath.   Cardiovascular: Negative for chest pain, palpitations and leg swelling.  Gastrointestinal: Negative for abdominal pain, diarrhea, nausea and vomiting.  Genitourinary: Negative for difficulty urinating and dysuria.  Musculoskeletal: Negative for back pain and joint swelling.  Skin: Negative for color change and rash.  Neurological: Negative for dizziness, light-headedness and headaches.  Hematological: Negative for adenopathy. Does not bruise/bleed easily.  Psychiatric/Behavioral: Negative for agitation  and dysphoric mood.       Objective:    Physical Exam  Constitutional: She is oriented to person, place, and time. She appears well-developed and well-nourished. No distress.  HENT:  Nose: Nose normal.  Mouth/Throat: Oropharynx is clear and moist.  Eyes: Right eye exhibits no discharge. Left eye exhibits no discharge. No scleral icterus.  Neck: Neck supple. No thyromegaly present.  Cardiovascular: Normal rate and regular rhythm.   Pulmonary/Chest: Breath sounds normal. No accessory muscle usage. No tachypnea. No respiratory distress. She has no decreased breath sounds. She has no wheezes. She has no rhonchi. Right breast exhibits no inverted nipple, no mass, no nipple discharge and no tenderness (no axillary adenopathy). Left breast exhibits no inverted nipple, no mass, no nipple discharge and no tenderness (no axilarry adenopathy).  Right breast pain.    Abdominal: Soft. Bowel sounds are normal. There is no tenderness.  Musculoskeletal: She exhibits no edema or tenderness.  Lymphadenopathy:    She has no cervical adenopathy.  Neurological: She is alert and oriented to person, place, and time.  Skin: Skin is warm. No rash noted. No erythema.  Psychiatric: She has a normal mood and affect. Her behavior is normal.    BP 140/82 (BP Location: Left Arm, Patient Position: Sitting, Cuff Size: Normal)   Pulse 100   Temp 98.6 F (37 C) (Oral)   Resp 12   Ht 5' 3"  (1.6 m)   Wt 217 lb 3.2 oz (98.5 kg)   SpO2 96%   BMI 38.48 kg/m  Wt Readings from Last 3 Encounters:  06/30/16 217 lb 3.2 oz (98.5 kg)  02/15/16 225 lb 9.6 oz (102.3 kg)  12/06/15 217 lb 3.2 oz (98.5 kg)     Lab Results  Component Value Date   WBC 7.9 06/05/2015   HGB 14.7 06/05/2015   HCT 44.2 06/05/2015   PLT 209.0 06/05/2015   GLUCOSE 104 (H) 06/17/2016   CHOL 189 06/17/2016   TRIG 175.0 (H) 06/17/2016   HDL 44.60 06/17/2016   LDLDIRECT 122.0 02/13/2016   LDLCALC 109 (H) 06/17/2016   ALT 20 06/17/2016   AST  24 06/17/2016   NA 140 06/17/2016   K 3.7 06/17/2016   CL 106 06/17/2016   CREATININE 0.88 06/17/2016   BUN 11 06/17/2016   CO2 31 06/17/2016   TSH 2.99 10/11/2015   HGBA1C 5.7 06/17/2016    Mm Digital Screening Bilateral  Result Date: 06/22/2015 CLINICAL DATA:  Screening. EXAM: DIGITAL SCREENING BILATERAL MAMMOGRAM WITH CAD COMPARISON:  Previous exam(s). ACR Breast Density Category b: There are scattered areas of fibroglandular density. FINDINGS: There are no findings suspicious for malignancy. Images were processed with CAD. IMPRESSION: No mammographic evidence of malignancy. A result letter of this screening mammogram will be mailed directly to the patient. RECOMMENDATION: Screening mammogram in one year. (Code:SM-B-01Y) BI-RADS CATEGORY  1: Negative.  Electronically Signed   By: Ammie Ferrier M.D.   On: 06/22/2015 13:00       Assessment & Plan:   Problem List Items Addressed This Visit    Health care maintenance    Physical today 06/30/16.  Mammogram 06/22/15 -Birads I.  Schedule f/u mammogram.  See note.  Declines further colonoscopy - per sister.       Hypercholesterolemia (Chronic)    Low cholesterol diet and exercise.  Follow lipid panel.       Hyperglycemia    Low carb diet and exercise.  Follow met b and a1c.        Hypertension    Blood pressure as outlined.  Has been under good control.  Spot check her pressure.  Follow.  Follow metabolic panel.        Melanoma ([place])    Followed by dermatology.       MG with exacerbation (myasthenia gravis) ([place])    Followed by neurology.  On mestinon.  Stable.       Obesity (BMI 30-39.9)    Discussed diet and exercise.  Follow.        OSA on CPAP    Stable.        Seizure disorder ([place])    No recent seizures.  Followed by neurology.         Other Visit Diagnoses    Breast pain, right    -  Primary   obtain mammogram with ultrasound.     Relevant Orders   MM Digital Diagnostic Bilat   US BREAST LTD UNI RIGHT  INC AXILLA (Completed)   MM DIAG BREAST TOMO BILATERAL (Completed)   Breast cancer screening           Einar Pheasant, MD

## 2016-06-30 NOTE — Progress Notes (Signed)
Pre-visit discussion using our clinic review tool. No additional management support is needed unless otherwise documented below in the visit note.  

## 2016-07-08 ENCOUNTER — Ambulatory Visit
Admission: RE | Admit: 2016-07-08 | Discharge: 2016-07-08 | Disposition: A | Discharge status: Home / Self Care | Payer: Medicare Other | Source: Ambulatory Visit | Attending: Internal Medicine | Admitting: Internal Medicine

## 2016-07-08 DIAGNOSIS — N644 Mastodynia: Secondary | ICD-10-CM

## 2016-07-08 DIAGNOSIS — R928 Other abnormal and inconclusive findings on diagnostic imaging of breast: Secondary | ICD-10-CM | POA: Diagnosis not present

## 2016-07-09 ENCOUNTER — Telehealth: Payer: Self-pay | Admitting: *Deleted

## 2016-07-09 NOTE — Telephone Encounter (Signed)
Number busy

## 2016-07-09 NOTE — Telephone Encounter (Signed)
Patient's sister requested a call in reference to mammogram  [place] [phone]

## 2016-07-12 ENCOUNTER — Encounter: Payer: Self-pay | Admitting: Internal Medicine

## 2016-07-12 NOTE — Assessment & Plan Note (Signed)
Followed by dermatology

## 2016-07-12 NOTE — Assessment & Plan Note (Signed)
Followed by neurology.  On mestinon.  Stable.

## 2016-07-12 NOTE — Assessment & Plan Note (Signed)
Low cholesterol diet and exercise.  Follow lipid panel.   

## 2016-07-12 NOTE — Assessment & Plan Note (Signed)
Low carb diet and exercise.  Follow met b and a1c.   

## 2016-07-12 NOTE — Assessment & Plan Note (Signed)
Discussed diet and exercise.  Follow.  

## 2016-07-12 NOTE — Assessment & Plan Note (Signed)
Stable

## 2016-07-12 NOTE — Assessment & Plan Note (Signed)
Blood pressure as outlined.  Has been under good control.  Spot check her pressure.  Follow.  Follow metabolic panel.

## 2016-07-12 NOTE — Assessment & Plan Note (Signed)
No recent seizures.  Followed by neurology.

## 2016-07-15 ENCOUNTER — Other Ambulatory Visit: Payer: Self-pay | Admitting: Neurology

## 2016-07-15 NOTE — Telephone Encounter (Signed)
See mammogram notes have been informed

## 2016-07-16 ENCOUNTER — Telehealth: Payer: Self-pay

## 2016-07-16 NOTE — Telephone Encounter (Signed)
I received a pa request for methotrexate for this pt. I need to know if pt has any supplemental insurance for medicare. Pharmacy provided the [facility]: [id number] and PCN: [id number] but it appears that I need the RX group number as well.  I called pt to discuss. No answer, left a message asking her to call me back.

## 2016-07-17 ENCOUNTER — Telehealth: Payer: Self-pay | Admitting: Internal Medicine

## 2016-07-17 NOTE — Telephone Encounter (Signed)
PA signed by Dr. Brett Fairy. Faxed to [employer]. Should have a determination in 1-3 business days.

## 2016-07-17 NOTE — Telephone Encounter (Signed)
Madison Burns at [facility] for prescriptions)called and is giving the option for prior auth for methotrexate to be done over the phone but is also asking to be called back if paperwork was never received by fax to be called at  Clinical line# [phone] XUC#[id number]

## 2016-07-17 NOTE — Telephone Encounter (Signed)
Left pt message asking to call Allison back directly at [phone] to schedule AWV. Thanks! °

## 2016-07-17 NOTE — Telephone Encounter (Signed)
I did receive the pa paperwork. I completed it, and am waiting for Dr. Edwena Felty review and signature.

## 2016-07-22 NOTE — Telephone Encounter (Signed)
PA approved by Blase Mess [phone]) through 07/17/2116.

## 2016-08-04 ENCOUNTER — Telehealth: Payer: Self-pay | Admitting: Internal Medicine

## 2016-08-04 DIAGNOSIS — R455 Hostility: Secondary | ICD-10-CM

## 2016-08-04 NOTE — Telephone Encounter (Signed)
Can we send another referral

## 2016-08-04 NOTE — Telephone Encounter (Signed)
Pt called wanting to know if another psychiatrist can be referred due to the other ones are out of network.    Call pt @ [phone]. Thank you!

## 2016-08-04 NOTE — Telephone Encounter (Signed)
Can we send to another provider?

## 2016-08-05 NOTE — Telephone Encounter (Signed)
Left message to return call to our office.  

## 2016-08-05 NOTE — Telephone Encounter (Signed)
Need more information.  Need to know who she was seeing (or was planning to see).  Also, need info regarding why needs to be seen.  I do not mind placing an order for referral.

## 2016-08-07 DIAGNOSIS — Z808 Family history of malignant neoplasm of other organs or systems: Secondary | ICD-10-CM | POA: Diagnosis not present

## 2016-08-07 DIAGNOSIS — L820 Inflamed seborrheic keratosis: Secondary | ICD-10-CM | POA: Diagnosis not present

## 2016-08-07 DIAGNOSIS — D485 Neoplasm of uncertain behavior of skin: Secondary | ICD-10-CM | POA: Diagnosis not present

## 2016-08-07 DIAGNOSIS — Z85820 Personal history of malignant melanoma of skin: Secondary | ICD-10-CM | POA: Diagnosis not present

## 2016-08-07 NOTE — Telephone Encounter (Signed)
Left message to return call to our office.  

## 2016-08-08 NOTE — Telephone Encounter (Signed)
Spoke to sister she has been seeing Dr. Kasandra Knudsen but he is now out of network and she would like to be sent to Dr. Nicolasa Ducking who is in network.

## 2016-08-10 NOTE — Telephone Encounter (Signed)
I will place the order for the referral.  Just need clarification on what Dr Kasandra Knudsen was seeing her for so can pass along to Dr Nicolasa Ducking.

## 2016-08-12 NOTE — Telephone Encounter (Signed)
Left message to return call to our office.  

## 2016-08-13 ENCOUNTER — Other Ambulatory Visit: Payer: Self-pay | Admitting: Neurology

## 2016-08-13 NOTE — Telephone Encounter (Signed)
Sister called and states she was being seen by him for "outburst"

## 2016-08-13 NOTE — Telephone Encounter (Signed)
I called pt, spoke to pt's sister, [name], per [facility]. I advised her that before the methotrexate can be refilled, pt needs an appt. Pt's sister is agreeable to a follow up on 08/14/16 at 2:15pm with Hoyle Sauer, NP and understands that the methotrexate will be refilled at that visit. Pt's sister verbalized understanding of appt date and time.

## 2016-08-13 NOTE — Telephone Encounter (Signed)
Order placed for psychiatry referral.   °

## 2016-08-13 NOTE — Telephone Encounter (Signed)
Order placed for f/u psychiatry appt

## 2016-08-14 ENCOUNTER — Ambulatory Visit (INDEPENDENT_AMBULATORY_CARE_PROVIDER_SITE_OTHER): Payer: PPO | Admitting: Nurse Practitioner

## 2016-08-14 ENCOUNTER — Encounter: Payer: Self-pay | Admitting: Nurse Practitioner

## 2016-08-14 VITALS — BP 160/87 | HR 81 | Ht 63.0 in | Wt 221.0 lb

## 2016-08-14 DIAGNOSIS — G4733 Obstructive sleep apnea (adult) (pediatric): Secondary | ICD-10-CM | POA: Diagnosis not present

## 2016-08-14 DIAGNOSIS — G7000 Myasthenia gravis without (acute) exacerbation: Secondary | ICD-10-CM

## 2016-08-14 DIAGNOSIS — G40309 Generalized idiopathic epilepsy and epileptic syndromes, not intractable, without status epilepticus: Secondary | ICD-10-CM | POA: Diagnosis not present

## 2016-08-14 DIAGNOSIS — F73 Profound intellectual disabilities: Secondary | ICD-10-CM

## 2016-08-14 DIAGNOSIS — E669 Obesity, unspecified: Secondary | ICD-10-CM

## 2016-08-14 DIAGNOSIS — Z9989 Dependence on other enabling machines and devices: Secondary | ICD-10-CM

## 2016-08-14 MED ORDER — PYRIDOSTIGMINE BROMIDE 60 MG PO TABS: ORAL_TABLET | ORAL | 6 refills | Status: DC

## 2016-08-14 MED ORDER — METHOTREXATE 2.5 MG PO TABS: 20.0000 mg | ORAL_TABLET | ORAL | 6 refills | Status: DC

## 2016-08-14 NOTE — Patient Instructions (Signed)
CPAP compliance excellent Continue Mestinon and methotrexate for myasthenia gravis will refill Reviewed recent labs from primary care History of seizure disorder no seizures in many years and has been weaned from seizure medication Follow-up in 6 months

## 2016-08-14 NOTE — Progress Notes (Signed)
[facility]  PATIENT: Madison Burns DOB: [birth year]   REASON FOR VISIT: Follow-up for obstructive sleep apnea, myasthenia gravis and a history of seizure disorder HISTORY FROM: Patient    HISTORY OF PRESENT ILLNESS:HISTORY [name] a 66 y.o.femaleseen here as a referral from Dr. Essie Hart Myasthenia [name], per special request of the patient.  Madison Burns carries a diagnosis of myasthenia gravis associated with difficulties swallowing, with a decreased level of energy a high degree of fatigue sometimes best interest in activities facial droop eyelid droop sleepiness snoring easy bruising, coughing, trouble swallowing she has a mild hoarseness but not as significant dysphonia and incontinence.  Her family and the patient looking for a maintenance Neurologist to keep her myasthenia under control. The patient is sleeping 12 hours easily per day, from 10 Pm to 8 AM. She has a droopy right eye.  She missed frequently the 3rd. Mestinon.  She has been followed by Dr. Melrose Nakayama at the [facility] clinic after following Dr. Paulla Dolly for years . The patient was diagnosed in 2011 with myasthenia gravis. She was hospitalized with myasthnic crisis Jan 27 2010- through [facility] 14th 2012. She was send to rehabilitation after that. Her sister noticed a decline in cognitive skills at that time.  Again, her first treatment had been prednisone and this was just weaned off by September 2015. The patient carries a diagnosis of hypercholesterolemia. She also has a past surgical history of appendectomy in 1999, hysterectomy in 2004 and the melanoma was removed in 2014 from the right side of the neck close to the TM joint.  Madison Burns is single without children, Lives with her sister now used to live with her mother. She is MRDD, she left school in 7th grade, went to a special facility until her GED, [employer]. She used to work at a [employer] ,later in a vocational trade.   Ms.  Kuwahara is taking methotrexate to control them myasthenia she is also on Mestinon 60 mg 3 times a day she takes Detrol for urinary continence at 4 mg in the morning and she takes Risperdal 2 mg I mouth at bedtime. She is also treated for hypothyroidism with levothyroxine at 50 g daily. The methotrexate is given at 8 tablets once a week. Until last September 2015 the patient had been on prednisone. She has not had labs drawn for the last 5 month, while continuing on MTX.  4/19/17Today's visit is solely dedicated to the sleep disorder part of her neurological care [name] underwent a sleep apnea test as a home sleep test, the AHI was 20.7 the lowest desaturation was 61 with 309 minutes of desaturation. This of course a possibility that in a home sleep test the hypoxemia is overestimated or artifact related but she does have a significant amount of obstructive sleep apnea as well. For this reason and because of her underlying condition of myasthenia gravis the patient was asked to undergo an attended CPAP titration. This took place on 04-19-14 the AHI was 0.3 on CPAP of only 5 cm water. Her SPO2 nadir rose to 91% no added oxygen was needed. I prescribed a ResMed air-fluid P 10 in medium size and an auto CPAP from 4 through 8 cm water. The patient states that she has not received CPAP machine that she had an outstanding bill was advanced home care which may be the reason.  She is on Medicare/ Medicaid and a sleep study cannot be older than 6 months to allow her  to obtain a machine - so I will have to repeat her sleep study. Her Epworth sleepiness score remains high at 17 points, her depression score at 3.5, fatigue severity questionnaire at 38 points. MRDD - needs assistence with CPAP, certainly would need a desensitization session with [facility] in [place].   Interval history from 06/06/2015 I had the pleasure of seeing Madison Burns today and in time after her last sleep study. She underwent a home sleep  test on February 1 which confirmed that she has apnea at an AHI of 20.7 and oxygen nadir of 61% and a total desaturation time of 309 minutes. The study was followed by an in lab CPAP titration but she reached an AHI of 0.2. 5 cm water appeared to be effective. She is using a ResMed air fit P 10 in very small nasal pillows. The patient also endorsed the Epworth sleepiness score at 12 points now from 17 prior to CPAP use. She had is still in the habit of going to take daytime naps but she feels not as if she can't resist the urge to nap. I have also the first compliance report available today she is 100% compliant has used it every day of the last 30 days and every day over 4 hours. Her average user time is 10 hours 8 minutes, her AHI is 1.1. She is struggling a little bit with allergic rhinitis, but has been able to still comply with CPAP use.  UPDATE 10/19/2017CM Madison Burns, 66 year old female returns for follow-up with her mother. She has a history of obstructive sleep apnea on CPAP compliance report today is 100% for 30 days average usage 10 hours 6 minutes AHI is 1.1. No leaks. In addition she has myasthenia gravis and is currently on Mestinon and methotrexate. She needs refills. No recent falls. She has no facial weakness no trouble swallowing. She also has a history of seizure disorder but has not had seizures in many years and has been weaned off of her seizure medication. She returns for reevaluation UPDATE 06/28/2018CM Madison Burns, 66 year old female returns for follow-up with her sister. Her mother recently died in 07/01/22. She has a history of obstructive sleep apnea on CPAP she also has a history of myasthenia gravis and is currently on Mestinon and methotrexate. She had a history of seizure disorder however she has been weaned off of her seizure medications without recurrent seizure activity. Her compliance report today from 07/15/2016 to  08/13/2016 greater than 4 hours for 29 days for 97%. Average  usage 9 hours 51 minutes 5 cm of pressure. The EPR level III3 AHI 1.8. Repeat BMP and hepatic function from 06/17/2016. She returns for reevaluation  REVIEW OF SYSTEMS: Full 14 system review of systems performed and notable only for those listed, all others are neg:  Constitutional: neg  Cardiovascular: neg Ear/Nose/Throat: neg  Skin: neg Eyes: neg Respiratory: neg Gastroitestinal: Frequency of urination Hematology/Lymphatic: neg  Endocrine: neg Musculoskeletal:neg Allergy/Immunology: neg Neurological: neg Psychiatric: neg Sleep : neg   ALLERGIES: Allergies  Allergen Reactions  . Ciprofloxacin Other (See Comments)  . Levofloxacin Other (See Comments)    HOME MEDICATIONS: Outpatient Medications Prior to Visit  Medication Sig Dispense Refill  . levothyroxine (SYNTHROID, LEVOTHROID) 50 MCG tablet TAKE ONE (1) TABLET BY MOUTH EVERY DAY 30 tablet 3  . methotrexate (RHEUMATREX) 2.5 MG tablet TAKE 8 TABLETS BY MOUTH ONCE WEEKLY 32 tablet 0  . nitrofurantoin, macrocrystal-monohydrate, (MACROBID) 100 MG capsule Take 1 capsule (100 mg total) by mouth 2 (  two) times daily. 10 capsule 0  . omeprazole (PRILOSEC) 20 MG capsule Take 1 capsule (20 mg total) by mouth daily. 30 capsule 3  . pyridostigmine (MESTINON) 60 MG tablet Take one at waking up in AM  8 , and one at lunch and one at 8 PM> 90 tablet 6  . risperiDONE (RISPERDAL) 1 MG tablet Take 1 mg by mouth at bedtime.    . tolterodine (DETROL LA) 4 MG 24 hr capsule TAKE 1 CAPSULE BY MOUTH EVERY MORNING 90 capsule 1   Facility-Administered Medications Prior to Visit  Medication Dose Route Frequency Provider Last Rate Last Dose  . betamethasone acetate-betamethasone sodium phosphate (CELESTONE) injection 3 mg  3 mg Intramuscular Once Edrick Kins, DPM        PAST MEDICAL HISTORY: Past Medical History:  Diagnosis Date  . GERD (gastroesophageal reflux disease)   . History of seizure disorder   . Hypercholesterolemia   . Hypertension    . Irritable bowel syndrome   . Myasthenia gravis ([place])     PAST SURGICAL HISTORY: Past Surgical History:  Procedure Laterality Date  . ABDOMINAL HYSTERECTOMY    . APPENDECTOMY  1999  . CHOLECYSTECTOMY  2013  . TONSILLECTOMY      FAMILY HISTORY: Family History  Problem Relation Age of Onset  . CVA Maternal Grandfather   . Colon cancer Maternal Grandfather        stomach/colon  . Heart disease Father        myocardial infarction  . Hyperlipidemia Father   . Hyperlipidemia Sister   . Bone cancer Unknown        cousin  . Alzheimer's disease Mother        maternal aunts  . Breast cancer Neg Hx     SOCIAL HISTORY: Social History   Social History  . Marital status: Single    Spouse name: N/A  . Number of children: 0  . Years of education: Special Ed   Occupational History  . Unemployed    Social History Main Topics  . Smoking status: Never Smoker  . Smokeless tobacco: Never Used  . Alcohol use No  . Drug use: No  . Sexual activity: Not on file   Other Topics Concern  . Not on file   Social History Narrative   Regular exercise-no   Caffeine Use-yes     PHYSICAL EXAM  Vitals:   08/14/16 1405  BP: (!) 160/87  Pulse: 81  Weight: 221 lb (100.2 kg)  Height: 5\' 3"  (1.6 m)   Body mass index is 39.15 kg/m.  Generalized: Well developed, in no acute distress  Head: normocephalic and atraumatic,. Oropharynx benign Mallopatti 3 Neck: Supple, circumference 18 inches Cardiac: Regular rate rhythm, no murmur  Musculoskeletal: No deformity   Neurological examination   Mentation: Alert oriented to time, place, history taking. Attention span and concentration appropriate. Recent and remote memory intact.  Follows all commands speech with dysphonia and sparse wordflow  Cranial nerve II-XII: Pupils were equal round reactive to light extraocular movements were full, visual field were full on confrontational test. Facial sensation and strength were normal. hearing  was intact to finger rubbing bilaterally. Uvula tongue midline. head turning and shoulder shrug were normal and symmetric.Tongue protrusion into cheek strength was normal. Motor: normal bulk and tone, full strength in the BUE, BLE, fine finger movements normal, no pronator drift. No focal weakness Sensory: normal and symmetric to light touch,  Coordination: finger-nose-finger,no dysmetria Reflexes: Symmetric upper and lower, plantar  responses were flexor bilaterally. Gait and Station: Rising up from seated position without assistance, normal stance,  moderate stride, good arm swing, smooth turning, able to perform tiptoe, and heel walking without difficulty. Unable to tandem walk  DIAGNOSTIC DATA (LABS, IMAGING, TESTING) - I reviewed patient records, labs, notes, testing and imaging myself where available.  Lab Results  Component Value Date   WBC 7.9 06/05/2015   HGB 14.7 06/05/2015   HCT 44.2 06/05/2015   MCV 97.7 06/05/2015   PLT 209.0 06/05/2015      Component Value Date/Time   NA 140 06/17/2016 0914   NA 144 04/04/2015 1600   NA 137 02/21/2012 1828   K 3.7 06/17/2016 0914   K 4.3 02/21/2012 1828   CL 106 06/17/2016 0914   CL 104 02/21/2012 1828   CO2 31 06/17/2016 0914   CO2 25 02/21/2012 1828   GLUCOSE 104 (H) 06/17/2016 0914   GLUCOSE 145 (H) 02/21/2012 1828   BUN 11 06/17/2016 0914   BUN 8 04/04/2015 1600   BUN 8 02/21/2012 1828   CREATININE 0.88 06/17/2016 0914   CREATININE 1.14 02/21/2012 1828   CALCIUM 8.9 06/17/2016 0914   CALCIUM 9.0 02/21/2012 1828   PROT 6.5 06/17/2016 0914   PROT 6.0 04/04/2015 1600   PROT 6.4 04/25/2011 0854   ALBUMIN 3.8 06/17/2016 0914   ALBUMIN 4.0 04/04/2015 1600   ALBUMIN 3.4 04/25/2011 0854   Burns 24 06/17/2016 0914   Burns 18 04/25/2011 0854   ALT 20 06/17/2016 0914   ALT 25 04/25/2011 0854   ALKPHOS 67 06/17/2016 0914   ALKPHOS 45 (L) 04/25/2011 0854   BILITOT 0.7 06/17/2016 0914   BILITOT 0.4 04/04/2015 1600   BILITOT 0.4  04/25/2011 0854   GFRNONAA 80 04/04/2015 1600   GFRNONAA 52 (L) 02/21/2012 1828   GFRAA 92 04/04/2015 1600   GFRAA >60 02/21/2012 1828   Lab Results  Component Value Date   CHOL 189 06/17/2016   HDL 44.60 06/17/2016   LDLCALC 109 (H) 06/17/2016   LDLDIRECT 122.0 02/13/2016   TRIG 175.0 (H) 06/17/2016   CHOLHDL 4 06/17/2016   Lab Results  Component Value Date   HGBA1C 5.7 06/17/2016    Lab Results  Component Value Date   TSH 2.99 10/11/2015      ASSESSMENT AND PLAN  66 y.o. year old female  has a past medical history of GERD (gastroesophageal reflux disease); History of seizure disorder; Hypercholesterolemia; Hypertension; Irritable bowel syndrome; and Myasthenia gravis ([place]). And obstructive sleep apnea. To follow-up.Compliance report today from 07/15/2016 to  08/13/2016 greater than 4 hours for 29 days for 97%. Average usage 9 hours 51 minutes 5 cm of pressure. The EPR level III3 AHI 1.8.  CPAP compliance excellent Continue Mestinon and methotrexate for myasthenia gravis will refill Reviewed recent labs from primary care BMP and hepatic function History of seizure disorder no seizures in many years and has been weaned from seizure medication Follow-up in 6 months I spent 25 min in total face to face time with the patient and sister more than 50% of which was spent counseling and coordination of care, reviewing test results reviewing medications and discussing and reviewing the diagnosis of myasthenia gravis and symptoms to report and obstructive sleep apnea. Reviewed  compliance report with them. Dennie Bible, [facility], [facility], APRN  [facility] [address], [place], [place] [address] [phone]

## 2016-08-14 NOTE — Progress Notes (Signed)
I agree with the assessment and plan as directed by NP .The patient is known to me .   Korri Ask, MD  

## 2016-08-18 ENCOUNTER — Ambulatory Visit (INDEPENDENT_AMBULATORY_CARE_PROVIDER_SITE_OTHER): Payer: PPO | Admitting: Podiatry

## 2016-08-18 DIAGNOSIS — M79609 Pain in unspecified limb: Secondary | ICD-10-CM | POA: Diagnosis not present

## 2016-08-18 DIAGNOSIS — B351 Tinea unguium: Secondary | ICD-10-CM

## 2016-08-18 NOTE — Progress Notes (Signed)
Complaint:  Visit Type: Patient returns to my office for continued preventative foot care services. Complaint: Patient states" my nails have grown long and thick and become painful to walk and wear shoes" . The patient presents for preventative foot care services. No changes to ROS  Podiatric Exam: Vascular: dorsalis pedis and posterior tibial pulses are palpable bilateral. Capillary return is immediate. Temperature gradient is WNL. Skin turgor WNL  Sensorium: Normal Semmes Weinstein monofilament test. Normal tactile sensation bilaterally. Nail Exam: Pt has thick disfigured discolored nails with subungual debris noted bilateral entire nail hallux through fifth toenails Ulcer Exam: There is no evidence of ulcer or pre-ulcerative changes or infection. Orthopedic Exam: Muscle tone and strength are WNL. No limitations in general ROM. No crepitus or effusions noted. Foot type and digits show no abnormalities. Bony prominences are unremarkable. Skin: No Porokeratosis. No infection or ulcers  Diagnosis:  Onychomycosis, , Pain in right toe, pain in left toes  Treatment & Plan Procedures and Treatment: Consent by patient was obtained for treatment procedures. The patient understood the discussion of treatment and procedures well. All questions were answered thoroughly reviewed. Debridement of mycotic and hypertrophic toenails, 1 through 5 bilateral and clearing of subungual debris. No ulceration, no infection noted.  Return Visit-Office Procedure: Patient instructed to return to the office for a follow up visit 3 months   for continued evaluation and treatment.    Waqas Bruhl DPM 

## 2016-08-21 NOTE — Telephone Encounter (Signed)
Left pt message asking to call Allison back directly at [phone] to schedule AWV. Thanks! °

## 2016-09-30 ENCOUNTER — Encounter: Payer: Self-pay | Admitting: Internal Medicine

## 2016-09-30 ENCOUNTER — Ambulatory Visit (INDEPENDENT_AMBULATORY_CARE_PROVIDER_SITE_OTHER): Payer: PPO | Admitting: Internal Medicine

## 2016-09-30 DIAGNOSIS — C439 Malignant melanoma of skin, unspecified: Secondary | ICD-10-CM | POA: Diagnosis not present

## 2016-09-30 DIAGNOSIS — F439 Reaction to severe stress, unspecified: Secondary | ICD-10-CM | POA: Diagnosis not present

## 2016-09-30 DIAGNOSIS — R739 Hyperglycemia, unspecified: Secondary | ICD-10-CM

## 2016-09-30 DIAGNOSIS — G40909 Epilepsy, unspecified, not intractable, without status epilepticus: Secondary | ICD-10-CM | POA: Diagnosis not present

## 2016-09-30 DIAGNOSIS — Z9989 Dependence on other enabling machines and devices: Secondary | ICD-10-CM | POA: Diagnosis not present

## 2016-09-30 DIAGNOSIS — E7800 Pure hypercholesterolemia, unspecified: Secondary | ICD-10-CM

## 2016-09-30 DIAGNOSIS — G7000 Myasthenia gravis without (acute) exacerbation: Secondary | ICD-10-CM

## 2016-09-30 DIAGNOSIS — I10 Essential (primary) hypertension: Secondary | ICD-10-CM

## 2016-09-30 DIAGNOSIS — G4733 Obstructive sleep apnea (adult) (pediatric): Secondary | ICD-10-CM

## 2016-09-30 DIAGNOSIS — E669 Obesity, unspecified: Secondary | ICD-10-CM

## 2016-09-30 NOTE — Progress Notes (Signed)
Pre-visit discussion using our clinic review tool. No additional management support is needed unless otherwise documented below in the visit note.  

## 2016-09-30 NOTE — Progress Notes (Signed)
Patient ID: Madison Burns, female   DOB: 1950/11/26, 66 y.o.   MRN: [id number]   Subjective:    Patient ID: Madison Burns, female    DOB: 08/01/1950, 66 y.o.   MRN: [id number]  HPI  Patient here for a scheduled follow up.  She is accompanied by her sister.  History obtained from both of them.  She is doing well.  Increased stress is dealing with her mother's death.  She is doing better.  Was seeing Dr Burman Riis. He is out of network now.  Request referral to a different psychiatrist.  She is doing better.  No breast pain.  No chest pain.  No sob.  No acid reflux.  No abdominal pain.  Bowels moving.  Seeing neurology for myasthenia.  Stable.  Using cpap.     Past Medical History:  Diagnosis Date  . GERD (gastroesophageal reflux disease)   . History of seizure disorder   . Hypercholesterolemia   . Hypertension   . Irritable bowel syndrome   . Myasthenia gravis [facility])    Past Surgical History:  Procedure Laterality Date  . ABDOMINAL HYSTERECTOMY    . APPENDECTOMY  1999  . CHOLECYSTECTOMY  2013  . TONSILLECTOMY     Family History  Problem Relation Age of Onset  . CVA Maternal Grandfather   . Colon cancer Maternal Grandfather        stomach/colon  . Heart disease Father        myocardial infarction  . Hyperlipidemia Father   . Hyperlipidemia Sister   . Bone cancer Unknown        cousin  . Alzheimer's disease Mother        maternal aunts  . Breast cancer Neg Hx    Social History   Social History  . Marital status: Single    Spouse name: N/A  . Number of children: 0  . Years of education: Special Ed   Occupational History  . Unemployed    Social History Main Topics  . Smoking status: Never Smoker  . Smokeless tobacco: Never Used  . Alcohol use No  . Drug use: No  . Sexual activity: Not Asked   Other Topics Concern  . None   Social History Narrative   Regular exercise-no   Caffeine Use-yes    Outpatient Encounter Prescriptions as of 09/30/2016  Medication Sig  .  levothyroxine (SYNTHROID, LEVOTHROID) 50 MCG tablet TAKE ONE (1) TABLET BY MOUTH EVERY DAY  . LORazepam (ATIVAN) 0.5 MG tablet   . methotrexate (RHEUMATREX) 2.5 MG tablet Take 8 tablets (20 mg total) by mouth once a week. Caution:Chemotherapy. Protect from light.  [name] omeprazole (PRILOSEC) 20 MG capsule Take 1 capsule (20 mg total) by mouth daily.  [name] pyridostigmine (MESTINON) 60 MG tablet Take one at waking up in AM  8 , and one at lunch and one at 8 PM>  . risperiDONE (RISPERDAL) 1 MG tablet Take 1 mg by mouth at bedtime.  . tolterodine (DETROL Burns) 4 MG 24 hr capsule TAKE 1 CAPSULE BY MOUTH EVERY MORNING  . [DISCONTINUED] nitrofurantoin, macrocrystal-monohydrate, (MACROBID) 100 MG capsule Take 1 capsule (100 mg total) by mouth 2 (two) times daily.   Facility-Administered Encounter Medications as of 09/30/2016  Medication  . betamethasone acetate-betamethasone sodium phosphate (CELESTONE) injection 3 mg    Review of Systems  Constitutional: Negative for appetite change and unexpected weight change.  HENT: Negative for congestion and sinus pressure.   Respiratory: Negative for cough, chest tightness and  shortness of breath.   Cardiovascular: Negative for chest pain, palpitations and leg swelling.  Gastrointestinal: Negative for abdominal pain, diarrhea, nausea and vomiting.  Genitourinary: Negative for difficulty urinating and dysuria.  Musculoskeletal: Negative for back pain and joint swelling.  Skin: Negative for color change and rash.  Neurological: Negative for dizziness, light-headedness and headaches.  Psychiatric/Behavioral: Negative for agitation and dysphoric mood.       Objective:    Physical Exam  Constitutional: She appears well-developed and well-nourished. No distress.  HENT:  Nose: Nose normal.  Mouth/Throat: Oropharynx is clear and moist.  Neck: Neck supple. No thyromegaly present.  Cardiovascular: Normal rate and regular rhythm.   Pulmonary/Chest: Breath sounds  normal. No respiratory distress. She has no wheezes.  Abdominal: Soft. Bowel sounds are normal. There is no tenderness.  Musculoskeletal: She exhibits no edema or tenderness.  Lymphadenopathy:    She has no cervical adenopathy.  Skin: No rash noted. No erythema.  Psychiatric: She has a normal mood and affect. Her behavior is normal.    BP 130/82   Pulse 91   Temp 98.7 F (37.1 C) (Oral)   Resp 12   Ht 5' 3"  (1.6 m)   Wt 217 lb 9.6 oz (98.7 kg)   SpO2 96%   BMI 38.55 kg/m  Wt Readings from Last 3 Encounters:  09/30/16 217 lb 9.6 oz (98.7 kg)  08/14/16 221 lb (100.2 kg)  06/30/16 217 lb 3.2 oz (98.5 kg)     Lab Results  Component Value Date   WBC 7.9 06/05/2015   HGB 14.7 06/05/2015   HCT 44.2 06/05/2015   PLT 209.0 06/05/2015   GLUCOSE 104 (H) 06/17/2016   CHOL 189 06/17/2016   TRIG 175.0 (H) 06/17/2016   HDL 44.60 06/17/2016   LDLDIRECT 122.0 02/13/2016   LDLCALC 109 (H) 06/17/2016   ALT 20 06/17/2016   AST 24 06/17/2016   NA 140 06/17/2016   K 3.7 06/17/2016   CL 106 06/17/2016   CREATININE 0.88 06/17/2016   BUN 11 06/17/2016   CO2 31 06/17/2016   TSH 2.99 10/11/2015   HGBA1C 5.7 06/17/2016    US Breast Ltd Uni Right Inc Axilla  Result Date: 07/08/2016 CLINICAL DATA:  Focal pain right breast EXAM: 2D DIGITAL DIAGNOSTIC BILATERAL MAMMOGRAM WITH CAD AND ADJUNCT TOMO ULTRASOUND RIGHT BREAST COMPARISON:  Previous exam(s). ACR Breast Density Category b: There are scattered areas of fibroglandular density. FINDINGS: Cc and MLO views of bilateral breasts, spot tangential view of right breast are submitted. No suspicious abnormalities identified bilaterally. Mammographic images were processed with CAD. Targeted ultrasound is performed, showing no focal abnormal discrete cystic or solid lesion in the upper-outer quadrant right breast area of pain. IMPRESSION: Negative. RECOMMENDATION: Routine screening mammogram in 1 year. I have discussed the findings and recommendations  with the patient. Results were also provided in writing at the conclusion of the visit. If applicable, a reminder letter will be sent to the patient regarding the next appointment. BI-RADS CATEGORY  1: Negative. Electronically Signed   By: Abelardo Diesel M.D.   On: 07/08/2016 15:37   Mm Diag Breast Tomo Bilateral  Result Date: 07/08/2016 CLINICAL DATA:  Focal pain right breast EXAM: 2D DIGITAL DIAGNOSTIC BILATERAL MAMMOGRAM WITH CAD AND ADJUNCT TOMO ULTRASOUND RIGHT BREAST COMPARISON:  Previous exam(s). ACR Breast Density Category b: There are scattered areas of fibroglandular density. FINDINGS: Cc and MLO views of bilateral breasts, spot tangential view of right breast are submitted. No suspicious abnormalities identified bilaterally. Mammographic  images were processed with CAD. Targeted ultrasound is performed, showing no focal abnormal discrete cystic or solid lesion in the upper-outer quadrant right breast area of pain. IMPRESSION: Negative. RECOMMENDATION: Routine screening mammogram in 1 year. I have discussed the findings and recommendations with the patient. Results were also provided in writing at the conclusion of the visit. If applicable, a reminder letter will be sent to the patient regarding the next appointment. BI-RADS CATEGORY  1: Negative. Electronically Signed   By: Abelardo Diesel M.D.   On: 07/08/2016 15:37       Assessment & Plan:   Problem List Items Addressed This Visit    Hypercholesterolemia (Chronic)    Low cholesterol diet and exercise.  Follow lipid panel and liver function tests.        Relevant Orders   Hepatic function panel   Lipid panel   Hyperglycemia    Low carb diet and exercise.  Follow met b and a1c.       Relevant Orders   Hemoglobin A1c   Hypertension    Blood pressure on recheck improved.  Continue same medication regimen.  Follow pressures.  Follow metabolic panel.        Relevant Orders   CBC with Differential/Platelet   TSH   Basic metabolic  panel   Melanoma ([place])    Followed by dermatology.       Myasthenia gravis ([place])    Followed by neurology.  On MTX and mestinon.  Stable.       Obesity (BMI 30-39.9)    Diet and exercise.  Follow.        OSA on CPAP    Using cpap and doing well.  Follow.        Seizure disorder ([place])    No recent seizures.  Followed by neurology.       Stress    Increased stress with her mother's recent death.  Was seeing Dr Burman Riis. Out of network now.  Wants referral to another psychiatrist.  Overall improved.        Relevant Orders   Ambulatory referral to Psychiatry       Einar Pheasant, MD

## 2016-10-01 DIAGNOSIS — F39 Unspecified mood [affective] disorder: Secondary | ICD-10-CM | POA: Insufficient documentation

## 2016-10-01 NOTE — Assessment & Plan Note (Signed)
Low cholesterol diet and exercise.  Follow lipid panel and liver function tests.  

## 2016-10-01 NOTE — Assessment & Plan Note (Signed)
Increased stress with her mother's recent death.  Was seeing Dr Burman Riis. Out of network now.  Wants referral to another psychiatrist.  Overall improved.

## 2016-10-01 NOTE — Assessment & Plan Note (Signed)
Low carb diet and exercise.  Follow met b and a1c.  

## 2016-10-01 NOTE — Assessment & Plan Note (Signed)
Diet and exercise.  Follow.  

## 2016-10-01 NOTE — Assessment & Plan Note (Signed)
Followed by dermatology

## 2016-10-01 NOTE — Assessment & Plan Note (Signed)
Using cpap and doing well.  Follow.

## 2016-10-01 NOTE — Assessment & Plan Note (Signed)
Blood pressure on recheck improved.  Continue same medication regimen.  Follow pressures.  Follow metabolic panel.   

## 2016-10-01 NOTE — Assessment & Plan Note (Signed)
No recent seizures.  Followed by neurology.

## 2016-10-01 NOTE — Assessment & Plan Note (Signed)
Followed by neurology.  On MTX and mestinon.  Stable.

## 2016-11-06 ENCOUNTER — Other Ambulatory Visit: Payer: Self-pay | Admitting: Internal Medicine

## 2016-11-12 ENCOUNTER — Other Ambulatory Visit: Payer: Self-pay | Admitting: Internal Medicine

## 2016-11-20 ENCOUNTER — Ambulatory Visit (INDEPENDENT_AMBULATORY_CARE_PROVIDER_SITE_OTHER): Payer: PPO | Admitting: Podiatry

## 2016-11-20 DIAGNOSIS — B351 Tinea unguium: Secondary | ICD-10-CM

## 2016-11-20 DIAGNOSIS — M79609 Pain in unspecified limb: Secondary | ICD-10-CM

## 2016-11-20 NOTE — Progress Notes (Signed)
Complaint:  Visit Type: Patient returns to my office for continued preventative foot care services. Complaint: Patient states" my nails have grown long and thick and become painful to walk and wear shoes" . The patient presents for preventative foot care services. No changes to ROS  Podiatric Exam: Vascular: dorsalis pedis and posterior tibial pulses are palpable bilateral. Capillary return is immediate. Temperature gradient is WNL. Skin turgor WNL  Sensorium: Normal Semmes Weinstein monofilament test. Normal tactile sensation bilaterally. Nail Exam: Pt has thick disfigured discolored nails with subungual debris noted bilateral entire nail hallux through fifth toenails Ulcer Exam: There is no evidence of ulcer or pre-ulcerative changes or infection. Orthopedic Exam: Muscle tone and strength are WNL. No limitations in general ROM. No crepitus or effusions noted. Foot type and digits show no abnormalities. Bony prominences are unremarkable. Skin: No Porokeratosis. No infection or ulcers  Diagnosis:  Onychomycosis, , Pain in right toe, pain in left toes  Treatment & Plan Procedures and Treatment: Consent by patient was obtained for treatment procedures. The patient understood the discussion of treatment and procedures well. All questions were answered thoroughly reviewed. Debridement of mycotic and hypertrophic toenails, 1 through 5 bilateral and clearing of subungual debris. No ulceration, no infection noted.  Return Visit-Office Procedure: Patient instructed to return to the office for a follow up visit 3 months   for continued evaluation and treatment.    Aimi Essner DPM 

## 2016-12-03 DIAGNOSIS — F79 Unspecified intellectual disabilities: Secondary | ICD-10-CM | POA: Diagnosis not present

## 2016-12-03 DIAGNOSIS — Z79899 Other long term (current) drug therapy: Secondary | ICD-10-CM | POA: Diagnosis not present

## 2016-12-03 DIAGNOSIS — F39 Unspecified mood [affective] disorder: Secondary | ICD-10-CM | POA: Diagnosis not present

## 2016-12-03 DIAGNOSIS — G4700 Insomnia, unspecified: Secondary | ICD-10-CM | POA: Diagnosis not present

## 2016-12-09 ENCOUNTER — Other Ambulatory Visit: Payer: Self-pay | Admitting: Nurse Practitioner

## 2016-12-09 DIAGNOSIS — G7000 Myasthenia gravis without (acute) exacerbation: Secondary | ICD-10-CM

## 2016-12-09 DIAGNOSIS — G40309 Generalized idiopathic epilepsy and epileptic syndromes, not intractable, without status epilepticus: Secondary | ICD-10-CM

## 2016-12-09 DIAGNOSIS — F73 Profound intellectual disabilities: Secondary | ICD-10-CM

## 2016-12-09 DIAGNOSIS — G4733 Obstructive sleep apnea (adult) (pediatric): Secondary | ICD-10-CM

## 2016-12-22 ENCOUNTER — Encounter: Payer: Self-pay | Admitting: Internal Medicine

## 2016-12-22 ENCOUNTER — Ambulatory Visit (INDEPENDENT_AMBULATORY_CARE_PROVIDER_SITE_OTHER): Payer: PPO | Admitting: Internal Medicine

## 2016-12-22 DIAGNOSIS — I10 Essential (primary) hypertension: Secondary | ICD-10-CM

## 2016-12-22 DIAGNOSIS — C439 Malignant melanoma of skin, unspecified: Secondary | ICD-10-CM | POA: Diagnosis not present

## 2016-12-22 DIAGNOSIS — G40909 Epilepsy, unspecified, not intractable, without status epilepticus: Secondary | ICD-10-CM | POA: Diagnosis not present

## 2016-12-22 DIAGNOSIS — E669 Obesity, unspecified: Secondary | ICD-10-CM | POA: Diagnosis not present

## 2016-12-22 DIAGNOSIS — Z0000 Encounter for general adult medical examination without abnormal findings: Secondary | ICD-10-CM

## 2016-12-22 DIAGNOSIS — Z23 Encounter for immunization: Secondary | ICD-10-CM | POA: Diagnosis not present

## 2016-12-22 DIAGNOSIS — E7800 Pure hypercholesterolemia, unspecified: Secondary | ICD-10-CM | POA: Diagnosis not present

## 2016-12-22 DIAGNOSIS — G7000 Myasthenia gravis without (acute) exacerbation: Secondary | ICD-10-CM

## 2016-12-22 DIAGNOSIS — R739 Hyperglycemia, unspecified: Secondary | ICD-10-CM | POA: Diagnosis not present

## 2016-12-22 DIAGNOSIS — Z9989 Dependence on other enabling machines and devices: Secondary | ICD-10-CM

## 2016-12-22 DIAGNOSIS — G4733 Obstructive sleep apnea (adult) (pediatric): Secondary | ICD-10-CM | POA: Diagnosis not present

## 2016-12-22 NOTE — Progress Notes (Signed)
Patient ID: Madison Burns, female    DOB: [birth year]  Age: 66 y.o. MRN: [id number]  The patient is here for annual Medicare wellness examination and management of other chronic and acute problems.   The risk factors are reflected in the social history.  The roster of all physicians providing medical care:  Dr Ubaldo Glassing (cardiology), Dr Prudence Davidson (podiatry), Evlyn Courier (neurology), Dr George Ina (opthalmology), Dr Nicolasa Ducking (psychiatry) and Dr Juleen China dentist.    Activities of daily living:  She needs assistance with bathing. Is  independent with dressing, toileting, feeding as well as independent mobility  Home safety : The patient has smoke detectors in the home. She wears seatbelts.  There are no firearms at home. There is no violence in the home.   There is no risks for hepatitis, STDs or HIV. There is no   history of blood transfusion.  She has no travel history to infectious disease endemic areas of the world.  The patient has seen their dentist in the last six month. She has seen her eye doctor in the last year.   She does not report any hearing change.  She does not have excessive sun exposure. Discussed the need for sun protection: hats, long sleeves and use of sunscreen if there is significant sun exposure.   Diet: the importance of a healthy diet is discussed.  The benefits of regular aerobic exercise were discussed. She does not do any regular exercise now.   Depression screen: seeing psychiatry.    Cognitive assessment: she is mildly mentally retarded.  Assessment somewhat limits.  Able to spell WORLD.  Unable to subtract serial 7s.  She could relate day,date,year and events; recalled 1/3 objects at 3 minutes.   The following portions of the patient's history were reviewed and updated as appropriate: allergies, current medications, past family history, past medical history,  past surgical history, past social history  and problem list.  Visual acuity was assessed.  Documented.  Does see an  ophthalmologist. Body mass index was assessed and reviewed.   During the course of the visit the patient was educated and counseled about appropriate screening and preventive services including : fall prevention , diabetes screening, nutrition counseling, colorectal cancer screening, and recommended immunizations.    Discussed immunization recommendations.  Did receive flu shot.  Discussed the need to continue with screening - remain up to date with colonoscopy and mammogram.     CC: Diagnoses of Encounter for immunization, Seizure disorder ([place]), OSA on CPAP, Obesity (BMI 30-39.9), Myasthenia gravis in remission ([place]), Malignant melanoma, unspecified site [facility]), Essential hypertension, Hypercholesterolemia, and Hyperglycemia were pertinent to this visit.  History Madison Burns has a past medical history of GERD (gastroesophageal reflux disease), History of seizure disorder, Hypercholesterolemia, Hypertension, Irritable bowel syndrome, and Myasthenia gravis ([place]).   She has a past surgical history that includes Cholecystectomy (2013); Appendectomy (1999); Abdominal hysterectomy; and Tonsillectomy.   Her family history includes Alzheimer's disease in her mother; Bone cancer in her unknown relative; CVA in her maternal grandfather; Colon cancer in her maternal grandfather; Heart disease in her father; Hyperlipidemia in her father and sister.She reports that  has never smoked. she has never used smokeless tobacco. She reports that she does not drink alcohol or use drugs.  Outpatient Medications Prior to Visit  Medication Sig Dispense Refill  . levothyroxine (SYNTHROID, LEVOTHROID) 50 MCG tablet TAKE ONE TABLET BY MOUTH EVERY DAY 30 tablet 3  . methotrexate (RHEUMATREX) 2.5 MG tablet Take 8 tablets (20 mg total) by mouth  once a week. Caution:Chemotherapy. Protect from light. 32 tablet 6  . omeprazole (PRILOSEC) 20 MG capsule TAKE 1 CAPSULE BY MOUTH EVERY DAY 30 capsule 2  . pyridostigmine (MESTINON) 60 MG  tablet TAKE 1 TABLET BY MOUTH AT WAKE UP 8AM, ONE AT LUNCH AND ONE AT 8PM 90 tablet 6  . risperiDONE (RISPERDAL) 1 MG tablet Take 1 mg by mouth at bedtime.    . tolterodine (DETROL Burns) 4 MG 24 hr capsule TAKE 1 CAPSULE BY MOUTH EVERY MORNING 90 capsule 1  . LORazepam (ATIVAN) 0.5 MG tablet      Facility-Administered Medications Prior to Visit  Medication Dose Route Frequency Provider Last Rate Last Dose  . betamethasone acetate-betamethasone sodium phosphate (CELESTONE) injection 3 mg  3 mg Intramuscular Once Edrick Kins, DPM        Review of Systems  Constitutional: Negative for appetite change and unexpected weight change.       More physical active now.  Helping to clean out her mother's house.    HENT: Negative for congestion and sinus pressure.   Respiratory: Negative for cough, chest tightness and shortness of breath.   Cardiovascular: Negative for chest pain, palpitations and leg swelling.  Gastrointestinal: Negative for abdominal pain, diarrhea, nausea and vomiting.  Genitourinary: Negative for difficulty urinating and dysuria.  Musculoskeletal: Negative for back pain and joint swelling.  Skin: Negative for color change and rash.  Neurological: Negative for dizziness, light-headedness and headaches.  Psychiatric/Behavioral: Negative for agitation and dysphoric mood.       Seeing psychiatry.  Stable.      Objective:  BP 140/84   Pulse 89   Temp 98.3 F (36.8 C) (Oral)   Resp 14   Ht 5' 3" (1.6 m)   Wt 223 lb (101.2 kg)   SpO2 96%   BMI 39.50 kg/m    Physical Exam  Constitutional: She appears well-developed and well-nourished. No distress.  HENT:  Nose: Nose normal.  Mouth/Throat: Oropharynx is clear and moist.  Neck: Neck supple. No thyromegaly present.  Cardiovascular: Normal rate and regular rhythm.  Pulmonary/Chest: Breath sounds normal. No respiratory distress. She has no wheezes.  Abdominal: Soft. Bowel sounds are normal. There is no tenderness.   Musculoskeletal: She exhibits no edema or tenderness.  Lymphadenopathy:    She has no cervical adenopathy.  Skin: No rash noted. No erythema.  Psychiatric: She has a normal mood and affect. Her behavior is normal.    Assessment & Plan:   Problem List Items Addressed This Visit    Hypercholesterolemia (Chronic)    Low cholesterol diet and exercise.  Follow lipid panel and liver function tests.        Hyperglycemia    Low carb diet and exercise.  Follow met b and a1c.        Hypertension    Blood pressure under good control.  Continue same medication regimen.  Follow pressures.  Follow metabolic panel.        Melanoma ([place])    Followed by dermatology.       Myasthenia gravis in remission ([place])    Followed by neurology.  Stable.       Obesity (BMI 30-39.9)    Discussed diet and exercise.  Follow.        OSA on CPAP    Using cpap regularly.  Doing well.  Stable.       Seizure disorder ([place])    Followed by neurology.  Stable.  No recent seizures.  Other Visit Diagnoses    Encounter for immunization       Relevant Orders   Flu vaccine HIGH DOSE PF (Completed)      I have discontinued Patrina Levering. Escalante's LORazepam. I am also having her maintain her risperiDONE, tolterodine, methotrexate, omeprazole, levothyroxine, and pyridostigmine. We will continue to administer betamethasone acetate-betamethasone sodium phosphate.  No orders of the defined types were placed in this encounter.   Medications Discontinued During This Encounter  Medication Reason  . LORazepam (ATIVAN) 0.5 MG tablet Completed Course    Follow-up: Return in about 4 months (around 04/21/2017) for follow up appt (2mn).   SEinar Pheasant MD

## 2016-12-25 ENCOUNTER — Encounter: Payer: Self-pay | Admitting: Internal Medicine

## 2016-12-25 NOTE — Assessment & Plan Note (Signed)
Using cpap regularly.  Doing well.  Stable.

## 2016-12-25 NOTE — Assessment & Plan Note (Signed)
Blood pressure under good control.  Continue same medication regimen.  Follow pressures.  Follow metabolic panel.   

## 2016-12-25 NOTE — Assessment & Plan Note (Signed)
Followed by neurology.  Stable.  No recent seizures.

## 2016-12-25 NOTE — Assessment & Plan Note (Signed)
Followed by neurology.  Stable  

## 2016-12-25 NOTE — Assessment & Plan Note (Signed)
Low carb diet and exercise.  Follow met b and a1c.   

## 2016-12-25 NOTE — Assessment & Plan Note (Signed)
Followed by dermatology

## 2016-12-25 NOTE — Assessment & Plan Note (Signed)
Discussed diet and exercise.  Follow.  

## 2016-12-25 NOTE — Assessment & Plan Note (Signed)
Low cholesterol diet and exercise.  Follow lipid panel and liver function tests.  

## 2016-12-30 ENCOUNTER — Other Ambulatory Visit: Payer: Self-pay | Admitting: Internal Medicine

## 2017-01-05 DIAGNOSIS — Z79899 Other long term (current) drug therapy: Secondary | ICD-10-CM | POA: Diagnosis not present

## 2017-01-05 DIAGNOSIS — F39 Unspecified mood [affective] disorder: Secondary | ICD-10-CM | POA: Diagnosis not present

## 2017-01-06 ENCOUNTER — Other Ambulatory Visit (INDEPENDENT_AMBULATORY_CARE_PROVIDER_SITE_OTHER): Payer: PPO

## 2017-01-06 ENCOUNTER — Ambulatory Visit (INDEPENDENT_AMBULATORY_CARE_PROVIDER_SITE_OTHER): Payer: PPO | Admitting: *Deleted

## 2017-01-06 DIAGNOSIS — E7800 Pure hypercholesterolemia, unspecified: Secondary | ICD-10-CM | POA: Diagnosis not present

## 2017-01-06 DIAGNOSIS — R739 Hyperglycemia, unspecified: Secondary | ICD-10-CM | POA: Diagnosis not present

## 2017-01-06 DIAGNOSIS — I10 Essential (primary) hypertension: Secondary | ICD-10-CM

## 2017-01-06 DIAGNOSIS — Z23 Encounter for immunization: Secondary | ICD-10-CM

## 2017-01-06 LAB — CBC WITH DIFFERENTIAL/PLATELET
Basophils Absolute: 0.1 10*3/uL (ref 0.0–0.1)
Basophils Relative: 0.9 % (ref 0.0–3.0)
EOS ABS: 0.1 10*3/uL (ref 0.0–0.7)
Eosinophils Relative: 2 % (ref 0.0–5.0)
HEMATOCRIT: 43.9 % (ref 36.0–46.0)
Hemoglobin: 14.4 g/dL (ref 12.0–15.0)
Lymphocytes Relative: 27 % (ref 12.0–46.0)
Lymphs Abs: 1.9 10*3/uL (ref 0.7–4.0)
MCHC: 32.9 g/dL (ref 30.0–36.0)
MCV: 98.6 fl (ref 78.0–100.0)
MONO ABS: 0.4 10*3/uL (ref 0.1–1.0)
Monocytes Relative: 6.3 % (ref 3.0–12.0)
NEUTROS ABS: 4.4 10*3/uL (ref 1.4–7.7)
Neutrophils Relative %: 63.8 % (ref 43.0–77.0)
PLATELETS: 198 10*3/uL (ref 150.0–400.0)
RBC: 4.46 Mil/uL (ref 3.87–5.11)
RDW: 16.3 % — AB (ref 11.5–15.5)
WBC: 6.9 10*3/uL (ref 4.0–10.5)

## 2017-01-06 LAB — BASIC METABOLIC PANEL
BUN: 11 mg/dL (ref 6–23)
CALCIUM: 9 mg/dL (ref 8.4–10.5)
CO2: 30 mEq/L (ref 19–32)
Chloride: 103 mEq/L (ref 96–112)
Creatinine, Ser: 1 mg/dL (ref 0.40–1.20)
GFR: 59.03 mL/min — AB (ref >60.00)
Glucose, Bld: 101 mg/dL — ABNORMAL HIGH (ref 70–99)
Potassium: 3.5 mEq/L (ref 3.5–5.1)
Sodium: 139 mEq/L (ref 135–145)

## 2017-01-06 LAB — LIPID PANEL
CHOLESTEROL: 192 mg/dL (ref 0–200)
HDL: 37.2 mg/dL — ABNORMAL LOW (ref >39.00)
LDL Cholesterol: 120 mg/dL — ABNORMAL HIGH (ref 0–99)
NonHDL: 155.23
TRIGLYCERIDES: 178 mg/dL — AB (ref 0.0–149.0)
Total CHOL/HDL Ratio: 5
VLDL: 35.6 mg/dL (ref 0.0–40.0)

## 2017-01-06 LAB — HEMOGLOBIN A1C: Hgb A1c MFr Bld: 5.5 % (ref 4.6–6.5)

## 2017-01-06 LAB — HEPATIC FUNCTION PANEL
ALK PHOS: 59 U/L (ref 39–117)
ALT: 25 U/L (ref 0–35)
AST: 28 U/L (ref 0–37)
Albumin: 3.9 g/dL (ref 3.5–5.2)
BILIRUBIN DIRECT: 0.1 mg/dL (ref 0.0–0.3)
TOTAL PROTEIN: 6.3 g/dL (ref 6.0–8.3)
Total Bilirubin: 0.7 mg/dL (ref 0.2–1.2)

## 2017-01-06 LAB — TSH: TSH: 4.83 u[IU]/mL — ABNORMAL HIGH (ref 0.35–4.50)

## 2017-01-06 NOTE — Progress Notes (Signed)
Patient received Prevnar and tolerated injection well.

## 2017-01-12 ENCOUNTER — Other Ambulatory Visit: Payer: Self-pay | Admitting: *Deleted

## 2017-01-12 ENCOUNTER — Other Ambulatory Visit: Payer: Self-pay | Admitting: Internal Medicine

## 2017-01-12 DIAGNOSIS — E039 Hypothyroidism, unspecified: Secondary | ICD-10-CM

## 2017-01-12 MED ORDER — LEVOTHYROXINE SODIUM 75 MCG PO TABS: 75.0000 ug | ORAL_TABLET | Freq: Every day | ORAL | 1 refills | Status: DC

## 2017-01-12 NOTE — Progress Notes (Signed)
Order placed for f/u tsh.  

## 2017-02-04 ENCOUNTER — Ambulatory Visit: Payer: PPO | Admitting: Family

## 2017-02-04 ENCOUNTER — Other Ambulatory Visit (INDEPENDENT_AMBULATORY_CARE_PROVIDER_SITE_OTHER): Payer: PPO

## 2017-02-04 ENCOUNTER — Ambulatory Visit: Payer: Self-pay | Admitting: *Deleted

## 2017-02-04 ENCOUNTER — Encounter: Payer: Self-pay | Admitting: Family

## 2017-02-04 ENCOUNTER — Ambulatory Visit (INDEPENDENT_AMBULATORY_CARE_PROVIDER_SITE_OTHER)
Admission: RE | Admit: 2017-02-04 | Discharge: 2017-02-04 | Disposition: A | Discharge status: Home / Self Care | Payer: PPO | Source: Ambulatory Visit | Attending: Family | Admitting: Family

## 2017-02-04 VITALS — BP 134/84 | HR 88 | Temp 98.7°F | Ht 63.0 in | Wt 221.0 lb

## 2017-02-04 DIAGNOSIS — R0789 Other chest pain: Secondary | ICD-10-CM

## 2017-02-04 DIAGNOSIS — R079 Chest pain, unspecified: Secondary | ICD-10-CM | POA: Diagnosis not present

## 2017-02-04 DIAGNOSIS — R05 Cough: Secondary | ICD-10-CM | POA: Diagnosis not present

## 2017-02-04 LAB — COMPREHENSIVE METABOLIC PANEL
ALK PHOS: 75 U/L (ref 39–117)
ALT: 19 U/L (ref 0–35)
AST: 20 U/L (ref 0–37)
Albumin: 3.9 g/dL (ref 3.5–5.2)
BILIRUBIN TOTAL: 0.5 mg/dL (ref 0.2–1.2)
BUN: 15 mg/dL (ref 6–23)
CO2: 31 mEq/L (ref 19–32)
CREATININE: 0.95 mg/dL (ref 0.40–1.20)
Calcium: 9.1 mg/dL (ref 8.4–10.5)
Chloride: 102 mEq/L (ref 96–112)
GFR: 62.62 mL/min (ref >60.00)
GLUCOSE: 114 mg/dL — AB (ref 70–99)
Potassium: 4.1 mEq/L (ref 3.5–5.1)
Sodium: 138 mEq/L (ref 135–145)
TOTAL PROTEIN: 6.7 g/dL (ref 6.0–8.3)

## 2017-02-04 LAB — CBC WITH DIFFERENTIAL/PLATELET
BASOS ABS: 0.1 10*3/uL (ref 0.0–0.1)
Basophils Relative: 1 % (ref 0.0–3.0)
EOS ABS: 0.2 10*3/uL (ref 0.0–0.7)
Eosinophils Relative: 1.6 % (ref 0.0–5.0)
HCT: 43.3 % (ref 36.0–46.0)
Hemoglobin: 14.1 g/dL (ref 12.0–15.0)
LYMPHS ABS: 2.2 10*3/uL (ref 0.7–4.0)
Lymphocytes Relative: 20.7 % (ref 12.0–46.0)
MCHC: 32.5 g/dL (ref 30.0–36.0)
MCV: 98 fl (ref 78.0–100.0)
MONO ABS: 0.5 10*3/uL (ref 0.1–1.0)
MONOS PCT: 5.2 % (ref 3.0–12.0)
NEUTROS PCT: 71.5 % (ref 43.0–77.0)
Neutro Abs: 7.5 10*3/uL (ref 1.4–7.7)
Platelets: 264 10*3/uL (ref 150.0–400.0)
RBC: 4.42 Mil/uL (ref 3.87–5.11)
RDW: 16.8 % — ABNORMAL HIGH (ref 11.5–15.5)
WBC: 10.4 10*3/uL (ref 4.0–10.5)

## 2017-02-04 MED ORDER — MELOXICAM 7.5 MG PO TABS: 7.5000 mg | ORAL_TABLET | Freq: Every day | ORAL | 0 refills | Status: DC

## 2017-02-04 NOTE — Telephone Encounter (Signed)
Pt's sister Madison Burns called stating that pt has had pain in her right chest and breast starting 02/01/17; she also states that the pt does not remember hurting herself; pt triaged per nurse protocol; pt offered and accepted appointment with Jodi Mourning at [address] [facility]) since no appointments were available at [facility]; pt verbalizes understanding; will route to [facility] pool to make them aware of this upcoming appointment.   Reason for Disposition . [1] Chest pain lasts > 5 minutes AND [2] occurred > 3 days ago (72 hours) AND [3] NO chest pain or cardiac symptoms now  Answer Assessment - Initial Assessment Questions 1. LOCATION: "Where does it hurt?"       Right chest and right breast 2. RADIATION: "Does the pain go anywhere else?" (e.g., into neck, jaw, arms, back)     no 3. ONSET: "When did the chest pain begin?" (Minutes, hours or days)      2 days ago 4. PATTERN "Does the pain come and go, or has it been constant since it started?"  "Does it get worse with exertion?"      Constant; does not get worse with exertion 5. DURATION: "How long does it last" (e.g., seconds, minutes, hours)     days 6. SEVERITY: "How bad is the pain?"  (e.g., Scale 1-10; mild, moderate, or severe)    - MILD (1-3): doesn't interfere with normal activities     - MODERATE (4-7): interferes with normal activities or awakens from sleep    - SEVERE (8-10): excruciating pain, unable to do any normal activities       mild 7. CARDIAC RISK FACTORS: "Do you have any history of heart problems or risk factors for heart disease?" (e.g., prior heart attack, angina; high blood pressure, diabetes, being overweight, high cholesterol, smoking, or strong family history of heart disease)     Overweight, hypertension 8. PULMONARY RISK FACTORS: "Do you have any history of lung disease?"  (e.g., blood clots in lung, asthma, emphysema, birth control pills)    Myasthenia gravis 9. CAUSE: "What do you think is causing the chest pain?"    unsure 10. OTHER SYMPTOMS: "Do you have any other symptoms?" (e.g., dizziness, nausea, vomiting, sweating, fever, difficulty breathing, cough)       ocassional cough (sister states that she has a dry cough at baseline)  11. PREGNANCY: "Is there any chance you are pregnant?" "When was your last menstrual period?"       No hyserectomy  Protocols used: CHEST PAIN-A-AH

## 2017-02-05 ENCOUNTER — Ambulatory Visit: Payer: PPO | Admitting: Internal Medicine

## 2017-02-06 ENCOUNTER — Other Ambulatory Visit: Payer: Self-pay | Admitting: Family

## 2017-02-06 DIAGNOSIS — J9811 Atelectasis: Secondary | ICD-10-CM

## 2017-02-06 MED ORDER — DOXYCYCLINE HYCLATE 100 MG PO TABS: 100.0000 mg | ORAL_TABLET | Freq: Two times a day (BID) | ORAL | 0 refills | Status: DC

## 2017-02-06 NOTE — Progress Notes (Signed)
Madison Burns is a 66 y.o. female with the following history as recorded in EpicCare:  Patient Active Problem List   Diagnosis Date Noted  . Stress 10/01/2016  . Pain in right toe(s) 06/17/2015  . Myasthenia gravis in remission ([place]) 06/06/2015  . OSA on CPAP 06/06/2015  . Dysuria 02/12/2015  . Health care maintenance 06/12/2014  . Obstructive sleep apnea syndrome 05/03/2014  . MG with exacerbation (myasthenia gravis) ([place]) 04/10/2014  . Hypersomnia with sleep apnea 04/10/2014  . Obesity hypoventilation syndrome ([place]) 04/10/2014  . Obesity (BMI 30-39.9) 01/29/2014  . Hyperglycemia 01/29/2014  . Abdominal pain 01/29/2014  . Melanoma ([place]) 01/23/2013  . Hypercholesterolemia 12/21/2011  . Hypertension 12/21/2011  . Myasthenia gravis ([place]) 12/21/2011  . Seizure disorder ([place]) 12/21/2011  . Irritable bowel 12/21/2011    Current Outpatient Medications  Medication Sig Dispense Refill  . levothyroxine (SYNTHROID, LEVOTHROID) 75 MCG tablet Take 1 tablet (75 mcg total) by mouth daily before breakfast. 90 tablet 1  . methotrexate (RHEUMATREX) 2.5 MG tablet Take 8 tablets (20 mg total) by mouth once a week. Caution:Chemotherapy. Protect from light. 32 tablet 6  . omeprazole (PRILOSEC) 20 MG capsule TAKE 1 CAPSULE BY MOUTH EVERY DAY 30 capsule 2  . pyridostigmine (MESTINON) 60 MG tablet TAKE 1 TABLET BY MOUTH AT WAKE UP 8AM, ONE AT LUNCH AND ONE AT 8PM 90 tablet 6  . risperiDONE (RISPERDAL) 1 MG tablet Take 1 mg by mouth at bedtime.    . tolterodine (DETROL LA) 4 MG 24 hr capsule TAKE 1 CAPSULE BY MOUTH EVERY MORNING 90 capsule 1  . doxycycline (VIBRA-TABS) 100 MG tablet Take 1 tablet (100 mg total) by mouth 2 (two) times daily. 20 tablet 0  . meloxicam (MOBIC) 7.5 MG tablet Take 1 tablet (7.5 mg total) by mouth daily. 30 tablet 0   No current facility-administered medications for this visit.     Allergies: Ciprofloxacin and Levofloxacin  Past Medical History:  Diagnosis Date  . GERD  (gastroesophageal reflux disease)   . History of seizure disorder   . Hypercholesterolemia   . Hypertension   . Irritable bowel syndrome   . Myasthenia gravis [facility])     Past Surgical History:  Procedure Laterality Date  . ABDOMINAL HYSTERECTOMY    . APPENDECTOMY  1999  . CHOLECYSTECTOMY  2013  . TONSILLECTOMY      Family History  Problem Relation Age of Onset  . CVA Maternal Grandfather   . Colon cancer Maternal Grandfather        stomach/colon  . Heart disease Father        myocardial infarction  . Hyperlipidemia Father   . Hyperlipidemia Sister   . Bone cancer Unknown        cousin  . Alzheimer's disease Mother        maternal aunts  . Breast cancer Neg Hx     Social History   Tobacco Use  . Smoking status: Never Smoker  . Smokeless tobacco: Never Used  Substance Use Topics  . Alcohol use: No    Alcohol/week: 0.0 oz    Subjective:  Patient presents with her sister who provides the majority of the history today; notes that patient started with left sided breast pain on Monday of this week and then pain seemed to move to the right side of the breast/ chest; pain seems to radiate from breast into her right shoulder blade; patient has limited communication skills and sister is concerned that patient has very high  pain tolerance and cannot adequately express what is happening; both deny any chest pain or shortness of breath on exertion; per sister, patient has been able to continue with normal activities; denies any rash at area of concern; denies any productive cough or fever; gallbladder has been removed; no known injury or trauma or heavy lifting; had diagnostic mammogram earlier this year which was normal;    Objective:  Vitals:   02/04/17 1622  BP: 134/84  Pulse: 88  Temp: 98.7 F (37.1 C)  TempSrc: Oral  SpO2: 98%  Weight: 221 lb (100.2 kg)  Height: 5\' 3"  (1.6 m)    General: Well developed, well nourished, in no acute distress  Skin : Warm and dry.  Head:  Normocephalic and atraumatic  Eyes: Sclera and conjunctiva clear; pupils round and reactive to light; extraocular movements intact  Ears: External normal; canals clear; tympanic membranes normal  Oropharynx: Pink, supple. No suspicious lesions  Neck: Supple without thyromegaly, adenopathy  Lungs: Respirations unlabored; clear to auscultation bilaterally without wheeze, rales, rhonchi  CVS exam: normal rate and regular rhythm, S1 and S2 normal.  Neurologic: Alert and oriented; speech intact; face symmetrical; moves all extremities well; CNII-XII intact without focal deficit   Assessment:  1. Atypical chest pain     Plan:  ? Etiology; EKG in office is normal- low suspicion for cardiac source; discussed atypical presentation for shingles or muscular source; will update CXR today; trial of Mobic7.5 mg daily; follow-up to be determined after labs and X-ray;   No Follow-up on file.  Orders Placed This Encounter  Procedures  . DG Chest 2 View    Standing Status:   Future    Number of Occurrences:   1    Standing Expiration Date:   04/07/2018    Order Specific Question:   Reason for Exam (SYMPTOM  OR DIAGNOSIS REQUIRED)    Answer:   atypical chest pain    Order Specific Question:   Preferred imaging location?    Answer:   [facility]    Order Specific Question:   Radiology Contrast Protocol - do NOT remove file path    Answer:   [email or web address]  . CBC with Differential    Standing Status:   Future    Number of Occurrences:   1    Standing Expiration Date:   02/04/2018  . Comprehensive metabolic panel    Standing Status:   Future    Number of Occurrences:   1    Standing Expiration Date:   02/04/2018  . EKG 12-Lead    Requested Prescriptions   Signed Prescriptions Disp Refills  . meloxicam (MOBIC) 7.5 MG tablet 30 tablet 0    Sig: Take 1 tablet (7.5 mg total) by mouth daily.

## 2017-02-06 NOTE — Progress Notes (Signed)
Labs are normal.

## 2017-02-12 ENCOUNTER — Other Ambulatory Visit: Payer: Self-pay | Admitting: Internal Medicine

## 2017-02-17 ENCOUNTER — Encounter: Payer: Self-pay | Admitting: Neurology

## 2017-02-23 ENCOUNTER — Ambulatory Visit (INDEPENDENT_AMBULATORY_CARE_PROVIDER_SITE_OTHER): Payer: PPO | Admitting: Neurology

## 2017-02-23 ENCOUNTER — Encounter: Payer: Self-pay | Admitting: Neurology

## 2017-02-23 DIAGNOSIS — G40309 Generalized idiopathic epilepsy and epileptic syndromes, not intractable, without status epilepticus: Secondary | ICD-10-CM

## 2017-02-23 DIAGNOSIS — G4733 Obstructive sleep apnea (adult) (pediatric): Secondary | ICD-10-CM | POA: Insufficient documentation

## 2017-02-23 DIAGNOSIS — G7000 Myasthenia gravis without (acute) exacerbation: Secondary | ICD-10-CM

## 2017-02-23 DIAGNOSIS — E662 Morbid (severe) obesity with alveolar hypoventilation: Secondary | ICD-10-CM

## 2017-02-23 DIAGNOSIS — F73 Profound intellectual disabilities: Secondary | ICD-10-CM

## 2017-02-23 MED ORDER — PYRIDOSTIGMINE BROMIDE 60 MG PO TABS: ORAL_TABLET | ORAL | 6 refills | Status: DC

## 2017-02-23 MED ORDER — METHOTREXATE 2.5 MG PO TABS: 15.0000 mg | ORAL_TABLET | ORAL | 6 refills | Status: DC

## 2017-02-23 NOTE — Progress Notes (Signed)
SLEEP MEDICINE CLINIC   Provider:  [name], M D  Referring Provider: Einar Pheasant, MD Primary Care Physician:  Madison Pheasant, MD  Chief Complaint  Patient presents with  . Follow-up    pt with sister, rm 59. pt sister states that CPAP is working well.     HPI:  Madison Burns is a 67 y.o. female seen here as a referral from Madison Burns for Myasthenia gravis, per special request of the patient.  She has OSA and is suing CPAP compliantly. She has been stabile in her weight.   Wall history from 23 February 2017.  I have the pleasure of following Madison Burns for her obstructive sleep apnea, and she has again proven to be a highly compliant CPAP user.  Her CPAP compliance is 93% with 9 hours and 9 minutes on average daily use.  CPAP is set at only 5 cm water pressure and allows for reduction of the AHI to 0.6.  No central apneas are emerging and she has very few air leaks. Her baseline AHI in 02-2015 was 20.8/hr.  "She reports dreaming more- good dreams". She sleeps well. She has trouble with swallowing solid foods.  In addition she has not had significant muscle weakness, no falls, but she does feel easily fatigued and sleepy.  She endorsed the Epworth score today on 11 and the fatigue severity at 54 points. 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 She often cried during this task.    HPI: Madison Burns carries a diagnosis of myasthenia gravis associated with difficulties swallowing, with a decreased level of energy a high degree of fatigue sometimes best interest in activities facial droop eyelid droop sleepiness snoring easy bruising, coughing, trouble swallowing she has a mild hoarseness but not as significant dysphonia and incontinence.  Her family and the patient looking for a maintenance neurologist to keep her myasthenia under control. The patient is sleeping 12 hours easily per day, from 10 Pm to 8 AM. She has a droopy right eye. She missed  frequently the 3rd. Mestinon. She has been followed by Madison Burns at the [facility] clinic after following Madison Burns for years . The patient was diagnosed in 2011 with myasthenia gravis. She was hospitalized with myasthnic crisis Jan 27 2010- through [facility] 14th 2012. She was send to rehabilitation after that.Her sister noticed a decline in cognitive skills at that time. Again,  her first treatment had been prednisone and this was just weaned off by September 2015.The patient carries a diagnosis of hypercholesterolemia.  She also has a past surgical history of appendectomy in 1999, hysterectomy in 2004 and the melanoma was removed in 2014 from the right side of the neck close to the TM joint.  Madison Burns is single without children,  Lives with her sister now- she  used to live with her mother who died 06/12/16 .  She  has MRDD, she left school in 7th grade, went to a special facility until her GED, [employer]. She used to work at a [employer] ,later in a vocational trade.   Madison Burns is taking methotrexate to control them myasthenia she is also on Mestinon 60 mg 3 times a day she takes Detrol for urinary continence at 4 mg in the morning and she takes Risperdal 2 mg I mouth at bedtime. She is also treated for hypothyroidism with levothyroxine at 50 g daily. The methotrexate is given at 8 tablets  once a week. Until last September 2015 the patient had been on prednisone. She has not had labs drawn for the last 5 month, while continuing on MTX.  19 November 2015. Madison Burns underwent a sleep apnea test as a home sleep test, the AHI was 20.7 the lowest desaturation was 61 with 309 minutes of desaturation. This of course a possibility that in a home sleep test the hypoxemia is overestimated or artifact related but she does have a significant amount of obstructive sleep apnea as well. For this reason and because of her underlying condition of myasthenia gravis the patient was asked to undergo an attended CPAP  titration. This took place on 04-19-14 the AHI was 0.3 on CPAP of only 5 cm water. Her SPO2 nadir rose to 91% no added oxygen was needed. I prescribed a ResMed air-fluid P 10 in medium size and an auto CPAP from 4 through 8 cm water. The patient states that she has not received CPAP machine that she had an outstanding bill was advanced home care which may be the reason.  She is on Medicare/ Medicaid  and a sleep study cannot be older than 6 months to allow her to obtain a machine - so I will have to repeat her sleep study. Her Epworth sleepiness score remains high at 17 points, her depression score at 3.5, fatigue severity questionnaire at 38 points. MRDD - needs assistence with CPAP, certainly would need a desensitization session with [facility] in [place].   Interval history from 06/06/2015 I had the pleasure of seeing Madison Burns today and in time after her last sleep study. She underwent a home sleep test on February 1 which confirmed that she has apnea at an AHI of 20.7 and oxygen nadir of 61% and a total desaturation time of 309 minutes. The study was followed by an in lab CPAP titration but she reached an AHI of 0.2. 5 cm water appeared to be effective. She is using a ResMed air fit P 10 in very small nasal pillows. The patient also endorsed the Epworth sleepiness score at 12 points now from 17 prior to CPAP use. She had is still in the habit of going to take daytime naps but she feels not as if she can't resist the urge to nap.  I have also the first compliance report available today she is 100% compliant has used it every day of the last 30 days and every day over 4 hours. Her average user time is 10 hours 8 minutes, her AHI is 1.1. She is struggling a little bit with allergic rhinitis, but  has been able to still comply with CPAP use.   Review of Systems: Out of a complete 14 system review, the patient complains of only the following symptoms, and all other reviewed systems are negative. ",  Palpitations, weight gain on prednisone him a urinary incontinence, myalgia.learning disabilities. MRDD" .She feels her worst in the morning,but stated she feels refreshed.  She sleeps often in front of the TV. She .snores a little, she sleeps in a bed with 1 large pillow, she sleeps in daytime in a recliner.  Dysphagia.   Epworth score  10 from 15 prior to CPAP  , Fatigue severity score  53 up from 46 points   , depression score n/a    Social History   Socioeconomic History  . Marital status: Single    Spouse name: Not on file  . Number of children: 0  . Years of education: Special  Ed  . Highest education level: Not on file  Social Needs  . Financial resource strain: Not on file  . Food insecurity - worry: Not on file  . Food insecurity - inability: Not on file  . Transportation needs - medical: Not on file  . Transportation needs - non-medical: Not on file  Occupational History  . Occupation: Unemployed  Tobacco Use  . Smoking status: Never Smoker  . Smokeless tobacco: Never Used  Substance and Sexual Activity  . Alcohol use: No    Alcohol/week: 0.0 oz  . Drug use: No  . Sexual activity: Not on file  Other Topics Concern  . Not on file  Social History Narrative   Regular exercise-no   Caffeine Use-yes    Family History  Problem Relation Age of Onset  . CVA Maternal Grandfather   . Colon cancer Maternal Grandfather        stomach/colon  . Heart disease Father        myocardial infarction  . Hyperlipidemia Father   . Hyperlipidemia Sister   . Bone cancer Unknown        cousin  . Alzheimer's disease Mother        maternal aunts  . Breast cancer Neg Hx     Past Medical History:  Diagnosis Date  . GERD (gastroesophageal reflux disease)   . History of seizure disorder   . Hypercholesterolemia   . Hypertension   . Irritable bowel syndrome   . Myasthenia gravis [facility])     Past Surgical History:  Procedure Laterality Date  . ABDOMINAL HYSTERECTOMY    .  APPENDECTOMY  1999  . CHOLECYSTECTOMY  2013  . TONSILLECTOMY      Current Outpatient Medications  Medication Sig Dispense Refill  . doxycycline (VIBRA-TABS) 100 MG tablet Take 1 tablet (100 mg total) by mouth 2 (two) times daily. 20 tablet 0  . levothyroxine (SYNTHROID, LEVOTHROID) 75 MCG tablet Take 1 tablet (75 mcg total) by mouth daily before breakfast. 90 tablet 1  . meloxicam (MOBIC) 7.5 MG tablet Take 1 tablet (7.5 mg total) by mouth daily. 30 tablet 0  . methotrexate (RHEUMATREX) 2.5 MG tablet Take 8 tablets (20 mg total) by mouth once a week. Caution:Chemotherapy. Protect from light. 32 tablet 6  . omeprazole (PRILOSEC) 20 MG capsule TAKE 1 CAPSULE BY MOUTH EVERY DAY 30 capsule 2  . pyridostigmine (MESTINON) 60 MG tablet TAKE 1 TABLET BY MOUTH AT WAKE UP 8AM, ONE AT LUNCH AND ONE AT 8PM 90 tablet 6  . risperiDONE (RISPERDAL) 1 MG tablet Take 1 mg by mouth at bedtime.    . tolterodine (DETROL LA) 4 MG 24 hr capsule TAKE 1 CAPSULE BY MOUTH EVERY MORNING 90 capsule 1   No current facility-administered medications for this visit.     Allergies as of 02/23/2017 - Review Complete 02/23/2017  Allergen Reaction Noted  . Ciprofloxacin Other (See Comments) 02/07/2015  . Levofloxacin Other (See Comments) 02/07/2015    Vitals: BP (!) 152/86   Pulse 81   Ht 5\' 3"  (1.6 m)   Wt 226 lb (102.5 kg)   BMI 40.03 kg/m  Last Weight:  Wt Readings from Last 1 Encounters:  02/23/17 226 lb (102.5 kg)       Last Height:   Ht Readings from Last 1 Encounters:  02/23/17 5\' 3"  (1.6 m)    Physical exam:  General: The patient is awake, alert and appears tearful .  The patient is well groomed. Head: Normocephalic,  atraumatic. Neck is supple. Mallampati 4  neck circumference 16,5 .  Decreased nasal airflow - congestion,  Retrognathia seen. Macroglossia.  Cardiovascular:  Regular rate and rhythm , without  murmurs or carotid bruit, and without distended neck veins. Respiratory: Lungs are clear to  auscultation. Skin:  Without evidence of edema, or rash Trunk: BMI is  elevated and patient  has normal posture.  Neurologic exam : The patient is awake and alert, oriented to place and time.   MRDD Speech is non- fluent with dysphonia, aphasia. Sparse word flow.  Cranial nerves: Pupils are equal and briskly reactive to light.  Ptosis on the right. The upper eye lid touches the pupil. Extraocular movements  in vertical and horizontal planes intact and without nystagmus. Hearing to finger rub intact. Facial sensation intact to fine touch. Facial motor strength is symmetric and tongue and uvula move midline.  Motor exam:  Decreased overall tone, weak grip, unable to step on the stepping stool, she  sits slouched, she needs assistance with turns.  Sensory:  Fine touch, pinprick and vibration were intact.  Coordination:  Finger-to-nose maneuver  normal without evidence of ataxia, dysmetria or tremor. Deep tendon reflexes: in the  upper and lower extremities are symmetric and intact.   Assessment:  After physical and neurologic examination, review of laboratory studies, imaging, neurophysiology testing and pre-existing records, assessment is   1) OSA with baseline AHI per HST at 20.8 in 2017, marked hypoxemia and in context of the underlying autoimmune disorder -MG , the treatment options were reduced to CPAP . We reduced  Today MTX from 20 mg weekly ( 8 pills of 2.5 mg ) to 6 pills, 15 mg weekly.   2) This patient has MRDD, learning disabilities, never lived independent.  All medical relevant information was obtained from her sister.  She has a very low muscle tone, no grip strength, sleeps best in a recliner or on a large wedging pillow.   3) She has not had seizures for 6.5  years ( 09-2010) and was weaned off medication after a hospitalization.   Plan:  Treatment plan and additional workup : Continue CPAP use as indicated. The patient did very well .  Order MTX level, folic acid.  We can  try to wean her off.  Refill on Mestinon at lower dose ordered. I encourage a tid regimen.  Rv in 3-4 month with NP.   [name], MD   Dr. Katherina Mires Macario Shear MD  02/23/2017

## 2017-02-23 NOTE — Patient Instructions (Signed)
Methotrexate tablets What is this medicine? METHOTREXATE (METH oh TREX ate) is a chemotherapy drug used to treat cancer including breast cancer, leukemia, and lymphoma. This medicine can also be used to treat psoriasis and certain kinds of arthritis. This medicine may be used for other purposes; ask your health care provider or pharmacist if you have questions. COMMON BRAND NAME(S): Rheumatrex, Trexall What should I tell my health care provider before I take this medicine? They need to know if you have any of these conditions: -fluid in the stomach area or lungs -if you often drink alcohol -infection or immune system problems -kidney disease or on hemodialysis -liver disease -low blood counts, like low white cell, platelet, or red cell counts -lung disease -radiation therapy -stomach ulcers -ulcerative colitis -an unusual or allergic reaction to methotrexate, other medicines, foods, dyes, or preservatives -pregnant or trying to get pregnant -breast-feeding How should I use this medicine? Take this medicine by mouth with a glass of water. Follow the directions on the prescription label. Take your medicine at regular intervals. Do not take it more often than directed. Do not stop taking except on your doctor's advice. Make sure you know why you are taking this medicine and how often you should take it. If this medicine is used for a condition that is not cancer, like arthritis or psoriasis, it should be taken weekly, NOT daily. Taking this medicine more often than directed can cause serious side effects, even death. Talk to your healthcare provider about safe handling and disposal of this medicine. You may need to take special precautions. Talk to your pediatrician regarding the use of this medicine in children. While this drug may be prescribed for selected conditions, precautions do apply. Overdosage: If you think you have taken too much of this medicine contact a poison control center or  emergency room at once. NOTE: This medicine is only for you. Do not share this medicine with others. What if I miss a dose? If you miss a dose, talk with your doctor or health care professional. Do not take double or extra doses. What may interact with this medicine? This medicine may interact with the following medication: -acitretin -aspirin and aspirin-like medicines including salicylates -azathioprine -certain antibiotics like penicillins, tetracycline, and chloramphenicol -cyclosporine -gold -hydroxychloroquine -live virus vaccines -NSAIDs, medicines for pain and inflammation, like ibuprofen or naproxen -other cytotoxic agents -penicillamine -phenylbutazone -phenytoin -probenecid -retinoids such as isotretinoin and tretinoin -steroid medicines like prednisone or cortisone -sulfonamides like sulfasalazine and trimethoprim/sulfamethoxazole -theophylline This list may not describe all possible interactions. Give your health care provider a list of all the medicines, herbs, non-prescription drugs, or dietary supplements you use. Also tell them if you smoke, drink alcohol, or use illegal drugs. Some items may interact with your medicine. What should I watch for while using this medicine? Avoid alcoholic drinks. This medicine can make you more sensitive to the sun. Keep out of the sun. If you cannot avoid being in the sun, wear protective clothing and use sunscreen. Do not use sun lamps or tanning beds/booths. You may need blood work done while you are taking this medicine. Call your doctor or health care professional for advice if you get a fever, chills or sore throat, or other symptoms of a cold or flu. Do not treat yourself. This drug decreases your body's ability to fight infections. Try to avoid being around people who are sick. This medicine may increase your risk to bruise or bleed. Call your doctor or health care professional   if you notice any unusual bleeding. Check with your  doctor or health care professional if you get an attack of severe diarrhea, nausea and vomiting, or if you sweat a lot. The loss of too much body fluid can make it dangerous for you to take this medicine. Talk to your doctor about your risk of cancer. You may be more at risk for certain types of cancers if you take this medicine. Both men and women must use effective birth control with this medicine. Do not become pregnant while taking this medicine or until at least 1 normal menstrual cycle has occurred after stopping it. Women should inform their doctor if they wish to become pregnant or think they might be pregnant. Men should not father a child while taking this medicine and for 3 months after stopping it. There is a potential for serious side effects to an unborn child. Talk to your health care professional or pharmacist for more information. Do not breast-feed an infant while taking this medicine. What side effects may I notice from receiving this medicine? Side effects that you should report to your doctor or health care professional as soon as possible: -allergic reactions like skin rash, itching or hives, swelling of the face, lips, or tongue -breathing problems or shortness of breath -diarrhea -dry, nonproductive cough -low blood counts - this medicine may decrease the number of white blood cells, red blood cells and platelets. You may be at increased risk for infections and bleeding. -mouth sores -redness, blistering, peeling or loosening of the skin, including inside the mouth -signs of infection - fever or chills, cough, sore throat, pain or trouble passing urine -signs and symptoms of bleeding such as bloody or black, tarry stools; red or dark-brown urine; spitting up blood or brown material that looks like coffee grounds; red spots on the skin; unusual bruising or bleeding from the eye, gums, or nose -signs and symptoms of kidney injury like trouble passing urine or change in the amount  of urine -signs and symptoms of liver injury like dark yellow or brown urine; general ill feeling or flu-like symptoms; light-colored stools; loss of appetite; nausea; right upper belly pain; unusually weak or tired; yellowing of the eyes or skin Side effects that usually do not require medical attention (report to your doctor or health care professional if they continue or are bothersome): -dizziness -hair loss -tiredness -upset stomach -vomiting This list may not describe all possible side effects. Call your doctor for medical advice about side effects. You may report side effects to FDA at 1-800-FDA-1088. Where should I keep my medicine? Keep out of the reach of children. Store at room temperature between 20 and 25 degrees C (68 and 77 degrees F). Protect from light. Throw away any unused medicine after the expiration date. NOTE: This sheet is a summary. It may not cover all possible information. If you have questions about this medicine, talk to your doctor, pharmacist, or health care provider.  2018 Elsevier/Gold Standard (2014-10-09 05:39:22)  

## 2017-02-26 ENCOUNTER — Ambulatory Visit: Payer: PPO | Admitting: Podiatry

## 2017-03-02 DIAGNOSIS — G4700 Insomnia, unspecified: Secondary | ICD-10-CM | POA: Diagnosis not present

## 2017-03-02 DIAGNOSIS — F39 Unspecified mood [affective] disorder: Secondary | ICD-10-CM | POA: Diagnosis not present

## 2017-03-02 DIAGNOSIS — Z79899 Other long term (current) drug therapy: Secondary | ICD-10-CM | POA: Diagnosis not present

## 2017-03-02 DIAGNOSIS — F79 Unspecified intellectual disabilities: Secondary | ICD-10-CM | POA: Diagnosis not present

## 2017-03-25 ENCOUNTER — Encounter: Payer: Self-pay | Admitting: Emergency Medicine

## 2017-03-25 ENCOUNTER — Emergency Department: Payer: PPO

## 2017-03-25 ENCOUNTER — Emergency Department
Admission: EM | Admit: 2017-03-25 | Discharge: 2017-03-25 | Disposition: A | Discharge status: Home / Self Care | Payer: PPO | Attending: Emergency Medicine | Admitting: Emergency Medicine

## 2017-03-25 DIAGNOSIS — I10 Essential (primary) hypertension: Secondary | ICD-10-CM | POA: Diagnosis not present

## 2017-03-25 DIAGNOSIS — G4700 Insomnia, unspecified: Secondary | ICD-10-CM | POA: Diagnosis not present

## 2017-03-25 DIAGNOSIS — M79662 Pain in left lower leg: Secondary | ICD-10-CM | POA: Diagnosis not present

## 2017-03-25 DIAGNOSIS — Z85820 Personal history of malignant melanoma of skin: Secondary | ICD-10-CM | POA: Insufficient documentation

## 2017-03-25 DIAGNOSIS — Z79899 Other long term (current) drug therapy: Secondary | ICD-10-CM | POA: Insufficient documentation

## 2017-03-25 DIAGNOSIS — M79605 Pain in left leg: Secondary | ICD-10-CM | POA: Diagnosis not present

## 2017-03-25 DIAGNOSIS — F39 Unspecified mood [affective] disorder: Secondary | ICD-10-CM | POA: Diagnosis not present

## 2017-03-25 DIAGNOSIS — F79 Unspecified intellectual disabilities: Secondary | ICD-10-CM | POA: Diagnosis not present

## 2017-03-25 NOTE — ED Notes (Signed)
In [place] 

## 2017-03-25 NOTE — ED Triage Notes (Signed)
FIRST NURSE NOTE-here for pain to leg. Sister reports pt is special needs and she is afraid she has a blood clot because she sits a lot.  Pt ambulatory.

## 2017-03-25 NOTE — ED Provider Notes (Signed)
[facility] Emergency Department Provider Note  ____________________________________________   First MD Initiated Contact with Patient 03/25/17 1851     (approximate)  I have reviewed the triage vital signs and the nursing notes.   HISTORY  Chief Complaint Leg Pain    HPI Madison Burns is a 67 y.o. female is brought to the emergency department by personal vehicle by her legal guardian for 1 day of throbbing aching discomfort in left lower extremity.  The guardian is concerned she might have a DVT.  The patient has never had a blood clot.  No recent surgery travel or immobilization.  No chest pain or shortness of breath.  No trauma.  The patient is able to ambulate.  No numbness or weakness.  Past Medical History:  Diagnosis Date  . GERD (gastroesophageal reflux disease)   . History of seizure disorder   . Hypercholesterolemia   . Hypertension   . Irritable bowel syndrome   . Myasthenia gravis [facility])     Patient Active Problem List   Diagnosis Date Noted  . Epilepsy, generalized, convulsive ([place]) 02/23/2017  . OSA (obstructive sleep apnea) 02/23/2017  . Stress 10/01/2016  . Pain in right toe(s) 06/17/2015  . Myasthenia gravis in remission ([place]) 06/06/2015  . OSA on CPAP 06/06/2015  . Dysuria 02/12/2015  . Health care maintenance 06/12/2014  . Obstructive sleep apnea syndrome 05/03/2014  . MG with exacerbation (myasthenia gravis) ([place]) 04/10/2014  . Hypersomnia with sleep apnea 04/10/2014  . Obesity hypoventilation syndrome ([place]) 04/10/2014  . Obesity (BMI 30-39.9) 01/29/2014  . Hyperglycemia 01/29/2014  . Abdominal pain 01/29/2014  . Melanoma ([place]) 01/23/2013  . Hypercholesterolemia 12/21/2011  . Hypertension 12/21/2011  . Myasthenia gravis ([place]) 12/21/2011  . Seizure disorder ([place]) 12/21/2011  . Irritable bowel 12/21/2011    Past Surgical History:  Procedure Laterality Date  . ABDOMINAL HYSTERECTOMY    . APPENDECTOMY  1999  .  CHOLECYSTECTOMY  2013  . TONSILLECTOMY      Prior to Admission medications   Medication Sig Start Date End Date Taking? Authorizing Provider  doxycycline (VIBRA-TABS) 100 MG tablet Take 1 tablet (100 mg total) by mouth 2 (two) times daily. 02/06/17   Marrian Salvage, FNP  levothyroxine (SYNTHROID, LEVOTHROID) 75 MCG tablet Take 1 tablet (75 mcg total) by mouth daily before breakfast. 01/12/17   Einar Pheasant, MD  meloxicam (MOBIC) 7.5 MG tablet Take 1 tablet (7.5 mg total) by mouth daily. 02/04/17   Marrian Salvage, FNP  methotrexate (RHEUMATREX) 2.5 MG tablet Take 6 tablets (15 mg total) by mouth once a week. Caution: Protect from light. 02/23/17   Dohmeier, Asencion Partridge, MD  omeprazole (PRILOSEC) 20 MG capsule TAKE 1 CAPSULE BY MOUTH EVERY DAY 02/13/17   Einar Pheasant, MD  pyridostigmine (MESTINON) 60 MG tablet TAKE 1 TABLET BY MOUTH AT  8 AM, ONE AT LUNCH AND ONE AT [facility] 02/23/17   Dohmeier, Asencion Partridge, MD  risperiDONE (RISPERDAL) 1 MG tablet Take 1 mg by mouth at bedtime.    [provider]  tolterodine (DETROL LA) 4 MG 24 hr capsule TAKE 1 CAPSULE BY MOUTH EVERY MORNING 12/30/16   Einar Pheasant, MD    Allergies Ciprofloxacin and Levofloxacin  Family History  Problem Relation Age of Onset  . CVA Maternal Grandfather   . Colon cancer Maternal Grandfather        stomach/colon  . Heart disease Father        myocardial infarction  . Hyperlipidemia Father   .  Hyperlipidemia Sister   . Bone cancer Unknown        cousin  . Alzheimer's disease Mother        maternal aunts  . Breast cancer Neg Hx     Social History Social History   Tobacco Use  . Smoking status: Never Smoker  . Smokeless tobacco: Never Used  Substance Use Topics  . Alcohol use: No    Alcohol/week: 0.0 oz  . Drug use: No    Review of Systems Constitutional: No fever/chills ENT: No sore throat. Cardiovascular: Denies chest pain. Respiratory: Denies shortness of breath. Gastrointestinal: No  abdominal pain.  No nausea, no vomiting.  No diarrhea.  No constipation. Musculoskeletal: Negative for back pain. Neurological: Negative for headaches   ____________________________________________   PHYSICAL EXAM:  VITAL SIGNS: ED Triage Vitals [03/25/17 1554]  Enc Vitals Group     BP (!) 156/77     Pulse Rate 92     Resp 20     Temp 98.5 F (36.9 C)     Temp Source Oral     SpO2 98 %     Weight 222 lb (100.7 kg)     Height 5\' 3"  (1.6 m)     Head Circumference      Peak Flow      Pain Score      Pain Loc      Pain Edu?      Excl. in [place]?     Constitutional: Pleasant cooperative significant developmental delay Head: Atraumatic. Nose: No congestion/rhinnorhea. Mouth/Throat: No trismus Neck: No stridor.   Cardiovascular: Regular rate and rhythm Respiratory: Normal respiratory effort.  No retractions. MSK: Legs equal in size no erythema warmth tenderness or cords no focal tenderness Neurologic:  Normal speech and language. No [place] focal neurologic deficits are appreciated.  Skin:  Skin is warm, dry and intact. No rash noted.    ____________________________________________  LABS (all labs ordered are listed, but only abnormal results are displayed)  Labs Reviewed - No data to display   __________________________________________  EKG   ____________________________________________  RADIOLOGY  Ultrasound of the left lower extremity with no evidence of DVT ____________________________________________   DIFFERENTIAL includes but not limited to  DVT, cellulitis, myxedema, congestive heart failure   PROCEDURES  Procedure(s) performed: no  Procedures  Critical Care performed: no  Observation: no ____________________________________________   INITIAL IMPRESSION / ASSESSMENT AND PLAN / ED COURSE  Pertinent labs & imaging results that were available during my care of the patient were reviewed by me and considered in my medical decision making (see  chart for details).  The patient is neurovascularly intact.  Ultrasound negative for acute clot.  Will be discharged home with primary care follow-up.  Legal guardian verbalizes understanding and agreement with the plan.      ____________________________________________   FINAL CLINICAL IMPRESSION(S) / ED DIAGNOSES  Final diagnoses:  Left leg pain      NEW MEDICATIONS STARTED DURING THIS VISIT:  Discharge Medication List as of 03/25/2017  6:55 PM       Note:  This document was prepared using Dragon voice recognition software and may include unintentional dictation errors.      Darel Hong, MD 03/25/17 2239

## 2017-03-25 NOTE — ED Triage Notes (Signed)
Pt comes into the ED via POV c/o leg pain.  Patient states this has started hurting today.  Patient states the pain started in her lower leg and now it has traveled to the upper leg.  Patient sits a great amount and the family would like to rule out a possible blood clot.  Patient presents with her sister who is her legal guardian.  Patient ambulatory to triage at this time. Denies any swelling, heat, or redness present.

## 2017-03-25 NOTE — Discharge Instructions (Signed)
It was a pleasure to take care of you today, and thank you for coming to our emergency department.  If you have any questions or concerns before leaving please ask the nurse to grab me and I'm more than happy to go through your aftercare instructions again.  If you were prescribed any opioid pain medication today such as Norco, Vicodin, Percocet, morphine, hydrocodone, or oxycodone please make sure you do not drive when you are taking this medication as it can alter your ability to drive safely.  If you have any concerns once you are home that you are not improving or are in fact getting worse before you can make it to your follow-up appointment, please do not hesitate to call 911 and come back for further evaluation.  Darel Hong, MD  Results for orders placed or performed in visit on 02/04/17  Comprehensive metabolic panel  Result Value Ref Range   Sodium 138 135 - 145 mEq/L   Potassium 4.1 3.5 - 5.1 mEq/L   Chloride 102 96 - 112 mEq/L   CO2 31 19 - 32 mEq/L   Glucose, Bld 114 (H) 70 - 99 mg/dL   BUN 15 6 - 23 mg/dL   Creatinine, Ser 0.95 0.40 - 1.20 mg/dL   Total Bilirubin 0.5 0.2 - 1.2 mg/dL   Alkaline Phosphatase 75 39 - 117 U/L   AST 20 0 - 37 U/L   ALT 19 0 - 35 U/L   Total Protein 6.7 6.0 - 8.3 g/dL   Albumin 3.9 3.5 - 5.2 g/dL   Calcium 9.1 8.4 - 10.5 mg/dL   GFR 62.62 >60.00 mL/min  CBC with Differential  Result Value Ref Range   WBC 10.4 4.0 - 10.5 K/uL   RBC 4.42 3.87 - 5.11 Mil/uL   Hemoglobin 14.1 12.0 - 15.0 g/dL   HCT 43.3 36.0 - 46.0 %   MCV 98.0 78.0 - 100.0 fl   MCHC 32.5 30.0 - 36.0 g/dL   RDW 16.8 (H) 11.5 - 15.5 %   Platelets 264.0 150.0 - 400.0 K/uL   Neutrophils Relative % 71.5 43.0 - 77.0 %   Lymphocytes Relative 20.7 12.0 - 46.0 %   Monocytes Relative 5.2 3.0 - 12.0 %   Eosinophils Relative 1.6 0.0 - 5.0 %   Basophils Relative 1.0 0.0 - 3.0 %   Neutro Abs 7.5 1.4 - 7.7 K/uL   Lymphs Abs 2.2 0.7 - 4.0 K/uL   Monocytes Absolute 0.5 0.1 - 1.0 K/uL     Eosinophils Absolute 0.2 0.0 - 0.7 K/uL   Basophils Absolute 0.1 0.0 - 0.1 K/uL   US Venous Img Lower Unilateral Left  Result Date: 03/25/2017 CLINICAL DATA:  Left lower extremity pain. EXAM: LEFT LOWER EXTREMITY VENOUS DOPPLER ULTRASOUND TECHNIQUE: Gray-scale sonography with graded compression, as well as color Doppler and duplex ultrasound were performed to evaluate the lower extremity deep venous systems from the level of the common femoral vein and including the common femoral, femoral, profunda femoral, popliteal and calf veins including the posterior tibial, peroneal and gastrocnemius veins when visible. The superficial great saphenous vein was also interrogated. Spectral Doppler was utilized to evaluate flow at rest and with distal augmentation maneuvers in the common femoral, femoral and popliteal veins. COMPARISON:  None. FINDINGS: Contralateral Common Femoral Vein: Respiratory phasicity is normal and symmetric with the symptomatic side. No evidence of thrombus. Normal compressibility. Common Femoral Vein: No evidence of thrombus. Normal compressibility, respiratory phasicity and response to augmentation. Saphenofemoral Junction: No evidence  of thrombus. Normal compressibility and flow on color Doppler imaging. Profunda Femoral Vein: No evidence of thrombus. Normal compressibility and flow on color Doppler imaging. Femoral Vein: No evidence of thrombus. Normal compressibility, respiratory phasicity and response to augmentation. Popliteal Vein: Focal web in the lumen of the proximal popliteal vein is not associated with thrombus and the vein compresses normally at this level. This has the appearance of a web related to prior DVT. No findings are seen to suggest acute thrombus. Calf Veins: No evidence of thrombus. Normal compressibility and flow on color Doppler imaging. Superficial Great Saphenous Vein: No evidence of thrombus. Normal compressibility. Venous Reflux:  None. Other Findings: No evidence  of superficial thrombophlebitis or abnormal fluid collection. IMPRESSION: No evidence of acute left lower extremity deep venous thrombosis. 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 This does not cause luminal narrowing or inhibit flow. Electronically Signed   By: Aletta Edouard M.D.   On: 03/25/2017 17:25

## 2017-03-25 NOTE — ED Notes (Addendum)
Reviewed discharge instructions, follow-up care with patient and patient's legal guardian. Patient and patient's legal guardian verbalized understanding of all information reviewed. Patient stable, with no distress noted at this time.

## 2017-04-22 ENCOUNTER — Ambulatory Visit (INDEPENDENT_AMBULATORY_CARE_PROVIDER_SITE_OTHER): Payer: PPO | Admitting: Internal Medicine

## 2017-04-22 ENCOUNTER — Encounter: Payer: Self-pay | Admitting: Internal Medicine

## 2017-04-22 VITALS — BP 128/82 | HR 75 | Temp 97.7°F | Resp 18 | Wt 214.8 lb

## 2017-04-22 DIAGNOSIS — C439 Malignant melanoma of skin, unspecified: Secondary | ICD-10-CM | POA: Diagnosis not present

## 2017-04-22 DIAGNOSIS — G4733 Obstructive sleep apnea (adult) (pediatric): Secondary | ICD-10-CM | POA: Diagnosis not present

## 2017-04-22 DIAGNOSIS — Z9989 Dependence on other enabling machines and devices: Secondary | ICD-10-CM

## 2017-04-22 DIAGNOSIS — R739 Hyperglycemia, unspecified: Secondary | ICD-10-CM

## 2017-04-22 DIAGNOSIS — R05 Cough: Secondary | ICD-10-CM

## 2017-04-22 DIAGNOSIS — R059 Cough, unspecified: Secondary | ICD-10-CM

## 2017-04-22 DIAGNOSIS — G7000 Myasthenia gravis without (acute) exacerbation: Secondary | ICD-10-CM | POA: Diagnosis not present

## 2017-04-22 DIAGNOSIS — G40909 Epilepsy, unspecified, not intractable, without status epilepticus: Secondary | ICD-10-CM | POA: Diagnosis not present

## 2017-04-22 DIAGNOSIS — E669 Obesity, unspecified: Secondary | ICD-10-CM

## 2017-04-22 DIAGNOSIS — E7800 Pure hypercholesterolemia, unspecified: Secondary | ICD-10-CM

## 2017-04-22 DIAGNOSIS — R7989 Other specified abnormal findings of blood chemistry: Secondary | ICD-10-CM | POA: Diagnosis not present

## 2017-04-22 DIAGNOSIS — R109 Unspecified abdominal pain: Secondary | ICD-10-CM | POA: Diagnosis not present

## 2017-04-22 NOTE — Progress Notes (Signed)
Patient ID: [name], female   DOB: [birth year], 67 y.o.   MRN: [id number]   Subjective:    Patient ID: [name], female    DOB: 1950-07-12, 67 y.o.   MRN: [id number]  HPI  Patient here for a scheduled follow up.  She is accompanied by her sister.  History obtained from both of them.  Reports doing relatively well.  Has sleep apnea.  Using cpap.  Doing well with this.  Saw neurology recently  MTX decreased to 64m q week. Doing well with change.  Was evaluated 02/04/17 for chest pain.  Diagnosed with atypical chest pain.  cxr obtained.  Gave a trial of mobic.  No t taking.  No chest pain now.  cxr with low lung volumes with bibasilar atelectasis.  Was treated with abx and it was recommended to f/u with pulmonary.  Discussed with them today.  She is having some persistent intermittent cough. No pain.  Has myasthenia gravis.  Breathing appears to be stable.  Agrees with pulmonary referral given persistent cough.  No acid reflux.  No abdominal pain reported.  Bowels moving.  Was also evaluated 03/25/17 for left leg pain.  Seeing in ER.  Ultrasound negative for acute DVT.  Question of previous DVT.  No pain now.     Past Medical History:  Diagnosis Date  . GERD (gastroesophageal reflux disease)   . History of seizure disorder   . Hypercholesterolemia   . Hypertension   . Irritable bowel syndrome   . Myasthenia gravis ([facility]    Past Surgical History:  Procedure Laterality Date  . ABDOMINAL HYSTERECTOMY    . APPENDECTOMY  1999  . CHOLECYSTECTOMY  2013  . TONSILLECTOMY     Family History  Problem Relation Age of Onset  . CVA Maternal Grandfather   . Colon cancer Maternal Grandfather        stomach/colon  . Heart disease Father        myocardial infarction  . Hyperlipidemia Father   . Hyperlipidemia Sister   . Bone cancer Unknown        cousin  . Alzheimer's disease Mother        maternal aunts  . Breast cancer Neg Hx    Social History   Socioeconomic History  . Marital  status: Single    Spouse name: None  . Number of children: 0  . Years of education: Special Ed  . Highest education level: None  Social Needs  . Financial resource strain: None  . Food insecurity - worry: None  . Food insecurity - inability: None  . Transportation needs - medical: None  . Transportation needs - non-medical: None  Occupational History  . Occupation: Unemployed  Tobacco Use  . Smoking status: Never Smoker  . Smokeless tobacco: Never Used  Substance and Sexual Activity  . Alcohol use: No    Alcohol/week: 0.0 oz  . Drug use: No  . Sexual activity: None  Other Topics Concern  . None  Social History Narrative   Regular exercise-no   Caffeine Use-yes    Outpatient Encounter Medications as of 04/22/2017  Medication Sig  . doxycycline (VIBRA-TABS) 100 MG tablet Take 1 tablet (100 mg total) by mouth 2 (two) times daily.  .Marland Kitchenlevothyroxine (SYNTHROID, LEVOTHROID) 75 MCG tablet Take 1 tablet (75 mcg total) by mouth daily before breakfast.  . meloxicam (MOBIC) 7.5 MG tablet Take 1 tablet (7.5 mg total) by mouth daily.  . methotrexate (RHEUMATREX) 2.5 MG tablet Take  6 tablets (15 mg total) by mouth once a week. Caution: Protect from light.  [name] omeprazole (PRILOSEC) 20 MG capsule TAKE 1 CAPSULE BY MOUTH EVERY DAY  . pyridostigmine (MESTINON) 60 MG tablet TAKE 1 TABLET BY MOUTH AT  8 AM, ONE AT LUNCH AND ONE AT 8PM  . risperiDONE (RISPERDAL) 1 MG tablet Take 1 mg by mouth at bedtime.  . tolterodine (DETROL LA) 4 MG 24 hr capsule TAKE 1 CAPSULE BY MOUTH EVERY MORNING   No facility-administered encounter medications on file as of 04/22/2017.     Review of Systems  Constitutional: Negative for appetite change and unexpected weight change.  HENT: Negative for congestion and sinus pressure.   Respiratory: Positive for cough. Negative for chest tightness and shortness of breath.   Cardiovascular: Negative for chest pain, palpitations and leg swelling.  Gastrointestinal: Negative  for abdominal pain, diarrhea, nausea and vomiting.  Genitourinary: Negative for difficulty urinating and dysuria.  Musculoskeletal: Negative for joint swelling and myalgias.  Skin: Negative for color change and rash.  Neurological: Negative for dizziness, light-headedness and headaches.  Psychiatric/Behavioral: Negative for agitation and dysphoric mood.       Objective:     Blood pressure rechecked by me:  128/82  Physical Exam  Constitutional: She appears well-developed and well-nourished. No distress.  HENT:  Nose: Nose normal.  Mouth/Throat: Oropharynx is clear and moist.  Neck: Neck supple. No thyromegaly present.  Cardiovascular: Normal rate and regular rhythm.  Pulmonary/Chest: Breath sounds normal. No respiratory distress. She has no wheezes.  Abdominal: Soft. Bowel sounds are normal.  Minimal tenderness to palpation over the abdomen - upper.    Musculoskeletal: She exhibits no edema or tenderness.  Lymphadenopathy:    She has no cervical adenopathy.  Skin: No rash noted. No erythema.  Psychiatric: She has a normal mood and affect. Her behavior is normal.    BP 128/82   Pulse 75   Temp 97.7 F (36.5 C) (Oral)   Resp 18   Wt 214 lb 12.8 oz (97.4 kg)   SpO2 98%   BMI 38.05 kg/m  Wt Readings from Last 3 Encounters:  04/22/17 214 lb 12.8 oz (97.4 kg)  03/25/17 222 lb (100.7 kg)  02/23/17 226 lb (102.5 kg)     Lab Results  Component Value Date   WBC 10.4 02/04/2017   HGB 14.1 02/04/2017   HCT 43.3 02/04/2017   PLT 264.0 02/04/2017   GLUCOSE 114 (H) 02/04/2017   CHOL 192 01/06/2017   TRIG 178.0 (H) 01/06/2017   HDL 37.20 (L) 01/06/2017   LDLDIRECT 122.0 02/13/2016   LDLCALC 120 (H) 01/06/2017   ALT 19 02/04/2017   AST 20 02/04/2017   NA 138 02/04/2017   K 4.1 02/04/2017   CL 102 02/04/2017   CREATININE 0.95 02/04/2017   BUN 15 02/04/2017   CO2 31 02/04/2017   TSH 4.83 (H) 01/06/2017   HGBA1C 5.5 01/06/2017    US Venous Img Lower Unilateral  Left  Result Date: 03/25/2017 CLINICAL DATA:  Left lower extremity pain. EXAM: LEFT LOWER EXTREMITY VENOUS DOPPLER ULTRASOUND TECHNIQUE: Gray-scale sonography with graded compression, as well as color Doppler and duplex ultrasound were performed to evaluate the lower extremity deep venous systems from the level of the common femoral vein and including the common femoral, femoral, profunda femoral, popliteal and calf veins including the posterior tibial, peroneal and gastrocnemius veins when visible. The superficial great saphenous vein was also interrogated. Spectral Doppler was utilized to evaluate flow at rest  and with distal augmentation maneuvers in the common femoral, femoral and popliteal veins. COMPARISON:  None. FINDINGS: Contralateral Common Femoral Vein: Respiratory phasicity is normal and symmetric with the symptomatic side. No evidence of thrombus. Normal compressibility. Common Femoral Vein: No evidence of thrombus. Normal compressibility, respiratory phasicity and response to augmentation. Saphenofemoral Junction: No evidence of thrombus. Normal compressibility and flow on color Doppler imaging. Profunda Femoral Vein: No evidence of thrombus. Normal compressibility and flow on color Doppler imaging. Femoral Vein: No evidence of thrombus. Normal compressibility, respiratory phasicity and response to augmentation. Popliteal Vein: Focal web in the lumen of the proximal popliteal vein is not associated with thrombus and the vein compresses normally at this level. This has the appearance of a web related to prior DVT. No findings are seen to suggest acute thrombus. Calf Veins: No evidence of thrombus. Normal compressibility and flow on color Doppler imaging. Superficial Great Saphenous Vein: No evidence of thrombus. Normal compressibility. Venous Reflux:  None. Other Findings: No evidence of superficial thrombophlebitis or abnormal fluid collection. IMPRESSION: No evidence of acute left lower extremity  deep venous thrombosis. 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 This does not cause luminal narrowing or inhibit flow. Electronically Signed   By: Aletta Edouard M.D.   On: 03/25/2017 17:25       Assessment & Plan:   Problem List Items Addressed This Visit    Abdominal pain    Some pain noted on exam as outlined.  Hard to obtain h/o previous pain (from pt).  Will check abdominal ultrasound.  Follow.        Relevant Orders   US Abdomen Complete   Cough    Persistent intermittent dry cough.  Recently seen for chest pain.  See office note.  CXR as outlined.   Recommended pulmonary referral.  Has h/o myasthenia.        Relevant Orders   Ambulatory referral to Pulmonology   Hypercholesterolemia (Chronic)    Low cholesterol diet and exercise.  Follow lipid panel and liver function tests.        Relevant Orders   Hepatic function panel   Lipid panel   Basic metabolic panel   Hyperglycemia    Low carb diet and exercise.  Follow met b and a1c.        Relevant Orders   Hemoglobin A1c   Melanoma ([place])    Followed by dermatology.       Myasthenia gravis ([place])    Followed by neurology.  On mestinon and MTX.  MTX dose just lowered.  Follow.        Relevant Orders   Ambulatory referral to Pulmonology   Obesity (BMI 30-39.9)    Diet and exercise.  Follow.       OSA on CPAP    Using cpap regularly.  Doing well.  Follow.       Seizure disorder ([place])    No recent seizures.  Off medication.  Followed by neurology.        Other Visit Diagnoses    Elevated TSH    -  Primary   Relevant Orders   TSH       Einar Pheasant, MD

## 2017-04-25 ENCOUNTER — Encounter: Payer: Self-pay | Admitting: Internal Medicine

## 2017-04-25 DIAGNOSIS — R059 Cough, unspecified: Secondary | ICD-10-CM | POA: Insufficient documentation

## 2017-04-25 DIAGNOSIS — R05 Cough: Secondary | ICD-10-CM | POA: Insufficient documentation

## 2017-04-25 NOTE — Assessment & Plan Note (Signed)
Low cholesterol diet and exercise.  Follow lipid panel and liver function tests.  

## 2017-04-25 NOTE — Assessment & Plan Note (Signed)
Using cpap regularly.  Doing well.  Follow.

## 2017-04-25 NOTE — Assessment & Plan Note (Signed)
Diet and exercise.  Follow.  

## 2017-04-25 NOTE — Assessment & Plan Note (Signed)
No recent seizures.  Off medication.  Followed by neurology.   

## 2017-04-25 NOTE — Assessment & Plan Note (Signed)
Persistent intermittent dry cough.  Recently seen for chest pain.  See office note.  CXR as outlined.   Recommended pulmonary referral.  Has h/o myasthenia.

## 2017-04-25 NOTE — Assessment & Plan Note (Signed)
Followed by dermatology

## 2017-04-25 NOTE — Assessment & Plan Note (Signed)
Followed by neurology.  On mestinon and MTX.  MTX dose just lowered.  Follow.

## 2017-04-25 NOTE — Assessment & Plan Note (Signed)
Low carb diet and exercise.  Follow met b and a1c.   

## 2017-04-25 NOTE — Assessment & Plan Note (Signed)
Some pain noted on exam as outlined.  Hard to obtain h/o previous pain (from pt).  Will check abdominal ultrasound.  Follow.

## 2017-05-01 ENCOUNTER — Ambulatory Visit
Admission: RE | Admit: 2017-05-01 | Discharge: 2017-05-01 | Disposition: A | Discharge status: Home / Self Care | Payer: PPO | Source: Ambulatory Visit | Attending: Internal Medicine | Admitting: Internal Medicine

## 2017-05-01 DIAGNOSIS — Z9049 Acquired absence of other specified parts of digestive tract: Secondary | ICD-10-CM | POA: Diagnosis not present

## 2017-05-01 DIAGNOSIS — R109 Unspecified abdominal pain: Secondary | ICD-10-CM

## 2017-05-01 DIAGNOSIS — R1030 Lower abdominal pain, unspecified: Secondary | ICD-10-CM | POA: Diagnosis not present

## 2017-05-05 ENCOUNTER — Other Ambulatory Visit: Payer: Self-pay | Admitting: Internal Medicine

## 2017-05-05 DIAGNOSIS — R7989 Other specified abnormal findings of blood chemistry: Secondary | ICD-10-CM

## 2017-05-05 DIAGNOSIS — K760 Fatty (change of) liver, not elsewhere classified: Secondary | ICD-10-CM

## 2017-05-05 DIAGNOSIS — R945 Abnormal results of liver function studies: Secondary | ICD-10-CM

## 2017-05-05 NOTE — Progress Notes (Signed)
Order placed for GI referral.   

## 2017-05-12 DIAGNOSIS — E669 Obesity, unspecified: Secondary | ICD-10-CM | POA: Diagnosis not present

## 2017-05-12 DIAGNOSIS — G4730 Sleep apnea, unspecified: Secondary | ICD-10-CM | POA: Diagnosis not present

## 2017-05-12 DIAGNOSIS — G7000 Myasthenia gravis without (acute) exacerbation: Secondary | ICD-10-CM | POA: Diagnosis not present

## 2017-05-12 DIAGNOSIS — G4710 Hypersomnia, unspecified: Secondary | ICD-10-CM | POA: Diagnosis not present

## 2017-05-12 DIAGNOSIS — E7800 Pure hypercholesterolemia, unspecified: Secondary | ICD-10-CM | POA: Diagnosis not present

## 2017-05-12 DIAGNOSIS — I10 Essential (primary) hypertension: Secondary | ICD-10-CM | POA: Diagnosis not present

## 2017-05-14 ENCOUNTER — Telehealth: Payer: Self-pay | Admitting: Internal Medicine

## 2017-05-14 MED ORDER — OMEPRAZOLE 20 MG PO CPDR: 20.0000 mg | DELAYED_RELEASE_CAPSULE | Freq: Every day | ORAL | 1 refills | Status: DC

## 2017-05-14 MED ORDER — LEVOTHYROXINE SODIUM 75 MCG PO TABS: 75.0000 ug | ORAL_TABLET | Freq: Every day | ORAL | 0 refills | Status: DC

## 2017-05-14 NOTE — Telephone Encounter (Signed)
Ok to send in #30 with no refills.  Reschedule lab appt.  Once has lab, can determine if dose correct and give more refills.

## 2017-05-14 NOTE — Telephone Encounter (Signed)
Copied from [place]. Topic: Quick Communication - See Telephone Encounter >> May 14, 2017 12:50 PM Ivar Drape wrote: CRM for notification. See Telephone encounter for: 05/14/17. Patient would like the follower medications refilled and sent to her preferred pharmacy [facility] in [place]: 1) levothyroxine (SYNTHROID, LEVOTHROID) 75 MCG tablet  2) omeprazole (PRILOSEC) 20 MG capsule

## 2017-05-14 NOTE — Telephone Encounter (Signed)
OK to fill levothyroxine patient did not return for 6 week recheck on TSH from last dosage change dated 01/06/17.

## 2017-05-14 NOTE — Telephone Encounter (Signed)
Refill sent and notified labs needed for further refills.

## 2017-05-25 DIAGNOSIS — R1013 Epigastric pain: Secondary | ICD-10-CM | POA: Diagnosis not present

## 2017-05-25 DIAGNOSIS — Z1211 Encounter for screening for malignant neoplasm of colon: Secondary | ICD-10-CM | POA: Diagnosis not present

## 2017-05-25 DIAGNOSIS — R1310 Dysphagia, unspecified: Secondary | ICD-10-CM | POA: Diagnosis not present

## 2017-06-02 ENCOUNTER — Encounter: Payer: Self-pay | Admitting: Internal Medicine

## 2017-06-02 ENCOUNTER — Ambulatory Visit: Payer: PPO | Admitting: Internal Medicine

## 2017-06-02 VITALS — Resp 16 | Ht 63.0 in | Wt 217.0 lb

## 2017-06-02 DIAGNOSIS — R059 Cough, unspecified: Secondary | ICD-10-CM

## 2017-06-02 DIAGNOSIS — R05 Cough: Secondary | ICD-10-CM | POA: Diagnosis not present

## 2017-06-02 MED ORDER — BENZONATATE 100 MG PO CAPS: 100.0000 mg | ORAL_CAPSULE | Freq: Three times a day (TID) | ORAL | 3 refills | Status: DC

## 2017-06-02 NOTE — Progress Notes (Signed)
[facility] Consultation      Assessment and Plan:  Cough. -Chronic intractable cough, has been present for many years.  Chest x-ray reviewed, no acute findings noted.  Discussed with sister that if the cough was from something dangerous such as a lung mass would have declared itself by now. -Therefore given her developmental disability and the difficulty in performing advanced testing on her, recommended to treat this conservatively. - Given a prescription for Tessalon 100 mg 3 times daily, and Nasacort twice daily.  Chronic rhinitis. -May be contributing to cough.  Start Nasacort 2 sprays in each nostril once daily.  Discussed that Nasacort may be better for her than Flonase due to the finer mist which may be easier for her to tolerate.  GERD. -Chronic GERD, symptoms currently controlled with omeprazole once daily.  She is going for a endoscopy and colonoscopy. -Continue omeprazole.  Myasthenia gravis. -May be contributing to reduced lung volumes seen on chest x-ray.  OSA.  --On CPAP, managed by neurology.    Date: 06/02/2017  MRN# [id number] Madison Burns December 22, 1950  Referring Physician: Dr. Nicki Reaper.   Madison Burns is a 67 y.o. old female seen in consultation for chief complaint of:    Chief Complaint  Patient presents with  . Consult    referred by Dr. Einar Pheasant.  . Cough    non productive cough per caregiver not sure if she is able to expel.  . Sleep Apnea    Managed my Dr. Brett Fairy @ [facility]. She wears cpap nightly.    HPI:   The patient is a 67 year old female accompanied by her sister who gives some of the history, she has developmental disability,  myasthenia Madison Burns is maintained on Mestinon and methotrexate.  Patient was referred at this time due to a chronic cough.  She also has a history of obstructive sleep apnea, currently on CPAP. She is here because of a dry cough which has been present for several years. It is variable, no  particular exacerbating or relieving factors. She does have difficulty swallowing due to myasthenia, and sometimes chokes upon eating food. She is not able to coordinate with cough drops.  Robitussin did not really help, she has tried on tessalon when she had a URTI last year and felt that it helped.   She is not a smoker, her dad smoked. Currently she lives with her sister. She can walk around the house without difficulty, she is not limited by dyspnea. She does tire out easily because of MG. She has had her pneumonia vaccine.  They have 2 dogs, not in bedroom.She does have reflux, and is on omeprazole.  She has occasional sinus drainage she was tried on nasal spray in the past, does not recall benefit.   Imaging personally reviewed, chest x-ray 02/04/17; chest x-ray shows lungs are unremarkable.  Elevated diaphragms bilaterally, may be related to obesity, or inability to take deep breath for the test.   PMHX:   Past Medical History:  Diagnosis Date  . GERD (gastroesophageal reflux disease)   . History of seizure disorder   . Hypercholesterolemia   . Hypertension   . Irritable bowel syndrome   . Myasthenia gravis [facility])    Surgical Hx:  Past Surgical History:  Procedure Laterality Date  . ABDOMINAL HYSTERECTOMY    . APPENDECTOMY  1999  . CHOLECYSTECTOMY  2013  . TONSILLECTOMY     Family Hx:  Family History  Problem Relation Age of Onset  .  CVA Maternal Grandfather   . Colon cancer Maternal Grandfather        stomach/colon  . Heart disease Father        myocardial infarction  . Hyperlipidemia Father   . Hyperlipidemia Sister   . Bone cancer Unknown        cousin  . Alzheimer's disease Mother        maternal aunts  . Breast cancer Neg Hx    Social Hx:   Social History   Tobacco Use  . Smoking status: Never Smoker  . Smokeless tobacco: Never Used  Substance Use Topics  . Alcohol use: No    Alcohol/week: 0.0 oz  . Drug use: No   Medication:    Current Outpatient  Medications:  .  levothyroxine (SYNTHROID, LEVOTHROID) 75 MCG tablet, Take 1 tablet (75 mcg total) by mouth daily before breakfast., Disp: 30 tablet, Rfl: 0 .  methotrexate (RHEUMATREX) 2.5 MG tablet, Take 6 tablets (15 mg total) by mouth once a week. Caution: Protect from light., Disp: 32 tablet, Rfl: 6 .  omeprazole (PRILOSEC) 20 MG capsule, Take 1 capsule (20 mg total) by mouth daily., Disp: 90 capsule, Rfl: 1 .  pyridostigmine (MESTINON) 60 MG tablet, TAKE 1 TABLET BY MOUTH AT  8 AM, ONE AT LUNCH AND ONE AT 8PM, Disp: 90 tablet, Rfl: 6 .  risperiDONE (RISPERDAL) 1 MG tablet, Take 1 mg by mouth at bedtime., Disp: , Rfl:  .  tolterodine (DETROL LA) 4 MG 24 hr capsule, TAKE 1 CAPSULE BY MOUTH EVERY MORNING, Disp: 90 capsule, Rfl: 1   Allergies:  Ciprofloxacin and Levofloxacin  Review of Systems: Gen:  Denies  fever, sweats, chills HEENT: Denies blurred vision, double vision. bleeds, sore throat Cvc:  No dizziness, chest pain. Resp:   Denies cough or sputum production, shortness of breath Gi: Denies swallowing difficulty, stomach pain. Gu:  Denies bladder incontinence, burning urine Ext:   No Joint pain, stiffness. Skin: No skin rash,  hives  Endoc:  No polyuria, polydipsia. Psych: No depression, insomnia. Other:  All other systems were reviewed with the patient and were negative other that what is mentioned in the HPI.   Physical Examination:   VS: Resp 16   Ht 5\' 3"  (1.6 m)   Wt 217 lb (98.4 kg)   BMI 38.44 kg/m   General Appearance: No distress  Neuro:without focal findings,  speech normal,  HEENT: PERRLA, EOM intact.   Pulmonary: normal breath sounds, No wheezing.  CardiovascularNormal S1,S2.  No m/r/g.   Abdomen: Benign, Soft, non-tender. Renal:  No costovertebral tenderness  GU:  No performed at this time. Endoc: No evident thyromegaly, no signs of acromegaly. Skin:   warm, no rashes, no ecchymosis  Extremities: normal, no cyanosis, clubbing.  Other findings:     LABORATORY PANEL:   CBC No results for input(s): WBC, HGB, HCT, PLT in the last 168 hours. ------------------------------------------------------------------------------------------------------------------  Chemistries  No results for input(s): NA, K, CL, CO2, GLUCOSE, BUN, CREATININE, CALCIUM, MG, AST, ALT, ALKPHOS, BILITOT in the last 168 hours.  Invalid input(s): GFRCGP ------------------------------------------------------------------------------------------------------------------  Cardiac Enzymes No results for input(s): TROPONINI in the last 168 hours. ------------------------------------------------------------  RADIOLOGY:  No results found.     Thank  you for the consultation and for allowing [facility] to assist in the care of your patient. Our recommendations are noted above.  Please contact us if we can be of further service.   Marda Stalker, MD.  Board Certified in  Internal Medicine, Pulmonary Medicine, Critical Care Medicine, and Sleep Medicine.  [facility] Office Number: [phone]  Patricia Pesa, M.D.  Merton Border, M.D  06/02/2017

## 2017-06-02 NOTE — Patient Instructions (Signed)
Try tessalon medication, 1 tablet three times daily. If does not work, can stop.  Start Nasacort 2 sprays in each nostril once daily for one month. If does not help can stop.

## 2017-06-21 ENCOUNTER — Encounter: Payer: Self-pay | Admitting: Nurse Practitioner

## 2017-06-22 NOTE — Progress Notes (Signed)
[facility]  PATIENT: Madison Burns DOB: [birth year]   REASON FOR VISIT: Follow-up for obstructive sleep apnea, myasthenia gravis and a history of seizure disorder HISTORY FROM: Patient    HISTORY OF PRESENT ILLNESS:HISTORY CDRebecca H Burns a 67 y.o.femaleseen here as a referral from Dr. Essie Hart Myasthenia [name], per special request of the patient.  Madison Burns carries a diagnosis of myasthenia gravis associated with difficulties swallowing, with a decreased level of energy a high degree of fatigue sometimes best interest in activities facial droop eyelid droop sleepiness snoring easy bruising, coughing, trouble swallowing she has a mild hoarseness but not as significant dysphonia and incontinence.  Her family and the patient looking for a maintenance Neurologist to keep her myasthenia under control. The patient is sleeping 12 hours easily per day, from 10 Pm to 8 AM. She has a droopy right eye.  She missed frequently the 3rd. Mestinon.  She has been followed by Dr. Melrose Nakayama at the [facility] clinic after following Dr. Paulla Dolly for years . The patient was diagnosed in 2011 with myasthenia gravis. She was hospitalized with myasthnic crisis Jan 27 2010- through [facility] 14th 2012. She was send to rehabilitation after that. Her sister noticed a decline in cognitive skills at that time.  Again, her first treatment had been prednisone and this was just weaned off by September 2015. The patient carries a diagnosis of hypercholesterolemia. She also has a past surgical history of appendectomy in 1999, hysterectomy in 2004 and the melanoma was removed in 2014 from the right side of the neck close to the TM joint.  [name] is single without children, Lives with her sister now used to live with her mother. She is MRDD, she left school in 7th grade, went to a special facility until her GED, [employer]. She used to work at a [employer] ,later in a vocational trade.   Ms.  Kuwahara is taking methotrexate to control them myasthenia she is also on Mestinon 60 mg 3 times a day she takes Detrol for urinary continence at 4 mg in the morning and she takes Risperdal 2 mg I mouth at bedtime. She is also treated for hypothyroidism with levothyroxine at 50 g daily. The methotrexate is given at 8 tablets once a week. Until last September 2015 the patient had been on prednisone. She has not had labs drawn for the last 5 month, while continuing on MTX.  4/19/17Today's visit is solely dedicated to the sleep disorder part of her neurological care [name] underwent a sleep apnea test as a home sleep test, the AHI was 20.7 the lowest desaturation was 61 with 309 minutes of desaturation. This of course a possibility that in a home sleep test the hypoxemia is overestimated or artifact related but she does have a significant amount of obstructive sleep apnea as well. For this reason and because of her underlying condition of myasthenia gravis the patient was asked to undergo an attended CPAP titration. This took place on 04-19-14 the AHI was 0.3 on CPAP of only 5 cm water. Her SPO2 nadir rose to 91% no added oxygen was needed. I prescribed a ResMed air-fluid P 10 in medium size and an auto CPAP from 4 through 8 cm water. The patient states that she has not received CPAP machine that she had an outstanding bill was advanced home care which may be the reason.  She is on Medicare/ Medicaid and a sleep study cannot be older than 6 months to allow her  to obtain a machine - so I will have to repeat her sleep study. Her Epworth sleepiness score remains high at 17 points, her depression score at 3.5, fatigue severity questionnaire at 38 points. MRDD - needs assistence with CPAP, certainly would need a desensitization session with [facility] in [place].   Interval history from 06/06/2015 I had the pleasure of seeing Madison Burns today and in time after her last sleep study. She underwent a home sleep  test on February 1 which confirmed that she has apnea at an AHI of 20.7 and oxygen nadir of 61% and a total desaturation time of 309 minutes. The study was followed by an in lab CPAP titration but she reached an AHI of 0.2. 5 cm water appeared to be effective. She is using a ResMed air fit P 10 in very small nasal pillows. The patient also endorsed the Epworth sleepiness score at 12 points now from 17 prior to CPAP use. She had is still in the habit of going to take daytime naps but she feels not as if she can't resist the urge to nap. I have also the first compliance report available today she is 100% compliant has used it every day of the last 30 days and every day over 4 hours. Her average user time is 10 hours 8 minutes, her AHI is 1.1. She is struggling a little bit with allergic rhinitis, but has been able to still comply with CPAP use.  UPDATE 10/19/2017CM Madison Burns, 67 year old female returns for follow-up with her mother. She has a history of obstructive sleep apnea on CPAP compliance report today is 100% for 30 days average usage 10 hours 6 minutes AHI is 1.1. No leaks. In addition she has myasthenia gravis and is currently on Mestinon and methotrexate. She needs refills. No recent falls. She has no facial weakness no trouble swallowing. She also has a history of seizure disorder but has not had seizures in many years and has been weaned off of her seizure medication. She returns for reevaluation UPDATE 06/28/2018CM Madison Burns, 67 year old female returns for follow-up with her sister. Her mother recently died in Jun 18, 2022. She has a history of obstructive sleep apnea on CPAP she also has a history of myasthenia gravis and is currently on Mestinon and methotrexate. She had a history of seizure disorder however she has been weaned off of her seizure medications without recurrent seizure activity. Her compliance report today from 07/15/2016 to  08/13/2016 greater than 4 hours for 29 days for 97%. Average  usage 9 hours 51 minutes 5 cm of pressure. The EPR level III3 AHI 1.8. Repeat BMP and hepatic function from 06/17/2016. She returns for reevaluation Madison Burns is a 67 y.o. female seen here as a referral from Dr. Nicki Reaper for Myasthenia gravis, per special request of the patient.  She has OSA and is suing CPAP compliantly. She has been stabile in her weight.   02/23/17 CD I have the pleasure of following Madison Burns for her obstructive sleep apnea, and she has again proven to be a highly compliant CPAP user.  Her CPAP compliance is 93% with 9 hours and 9 minutes on average daily use.  CPAP is set at only 5 cm water pressure and allows for reduction of the AHI to 0.6.  No central apneas are emerging and she has very few air leaks. Her baseline AHI in 02-2015 was 20.8/hr.  "She reports dreaming more- good dreams". She sleeps well. She has trouble with swallowing solid foods.  In addition she has not had significant muscle weakness, no falls, but she does feel easily fatigued and sleepy.  She endorsed the Epworth score today on 11 and the fatigue severity at 54 points. 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 She often cried during this task.  UPDATE 5/7/2019CM Madison Burns, 67 year old female returns for follow-up history of obstructive sleep apnea on CPAP.  She also has a history of myasthenia gravis and her methotrexate was reduced from 8 tablets to 6 at her  last visit in January with Dr. Brett Fairy.  She remains on Mestinon.  She has not had any associated symptoms with the decrease in medication.  CPAP compliance dated 05/23/2017-06/21/2017 shows compliance greater than 4 hours at 100%.  Average usage 10 hours 15 minutes.  Set pressure 5 cm.  EPR level 3 AHI 0.4 leaks 95th percentile at 5.2.  She is tolerating her CPAP well.  She is sleeping well she returns for reevaluation.  REVIEW OF SYSTEMS: Full 14 system review of systems performed and notable only for those listed,  all others are neg:  Constitutional: neg  Cardiovascular: neg Ear/Nose/Throat: neg  Skin: neg Eyes: neg Respiratory: neg Gastroitestinal: Frequency of urination Hematology/Lymphatic: neg  Endocrine: neg Musculoskeletal:neg Allergy/Immunology: neg Neurological: Myasthenia gravis Psychiatric: neg Sleep : Obstructive sleep apnea with CPAP   ALLERGIES: Allergies  Allergen Reactions  . Ciprofloxacin Other (See Comments)  . Levofloxacin Other (See Comments)    HOME MEDICATIONS: Outpatient Medications Prior to Visit  Medication Sig Dispense Refill  . benzonatate (TESSALON PERLES) 100 MG capsule Take 1 capsule (100 mg total) by mouth 3 (three) times daily. 90 capsule 3  . levothyroxine (SYNTHROID, LEVOTHROID) 75 MCG tablet Take 1 tablet (75 mcg total) by mouth daily before breakfast. 30 tablet 0  . methotrexate (RHEUMATREX) 2.5 MG tablet Take 6 tablets (15 mg total) by mouth once a week. Caution: Protect from light. 32 tablet 6  . omeprazole (PRILOSEC) 20 MG capsule Take 1 capsule (20 mg total) by mouth daily. 90 capsule 1  . pyridostigmine (MESTINON) 60 MG tablet TAKE 1 TABLET BY MOUTH AT  8 AM, ONE AT LUNCH AND ONE AT 8PM 90 tablet 6  . risperiDONE (RISPERDAL) 1 MG tablet Take 1 mg by mouth at bedtime.    . sucralfate (CARAFATE) 1 g tablet Take 1 g by mouth 2 (two) times daily.    [name] tolterodine (DETROL LA) 4 MG 24 hr capsule TAKE 1 CAPSULE BY MOUTH EVERY MORNING 90 capsule 1   No facility-administered medications prior to visit.     PAST MEDICAL HISTORY: Past Medical History:  Diagnosis Date  . GERD (gastroesophageal reflux disease)   . History of seizure disorder   . Hypercholesterolemia   . Hypertension   . Irritable bowel syndrome   . Myasthenia gravis ([place])   . OSA on CPAP     PAST SURGICAL HISTORY: Past Surgical History:  Procedure Laterality Date  . ABDOMINAL HYSTERECTOMY    . APPENDECTOMY  1999  . CHOLECYSTECTOMY  2013  . TONSILLECTOMY      FAMILY  HISTORY: Family History  Problem Relation Age of Onset  . CVA Maternal Grandfather   . Colon cancer Maternal Grandfather        stomach/colon  . Heart disease Father        myocardial infarction  . Hyperlipidemia Father   . Hyperlipidemia Sister   . Bone cancer Unknown  cousin  . Alzheimer's disease Mother        maternal aunts  . Myasthenia gravis Brother        ocular  . Breast cancer Neg Hx     SOCIAL HISTORY: Social History   Socioeconomic History  . Marital status: Single    Spouse name: Not on file  . Number of children: 0  . Years of education: Special Ed  . Highest education level: Not on file  Occupational History  . Occupation: Unemployed  Social Needs  . Financial resource strain: Not on file  . Food insecurity:    Worry: Not on file    Inability: Not on file  . Transportation needs:    Medical: Not on file    Non-medical: Not on file  Tobacco Use  . Smoking status: Never Smoker  . Smokeless tobacco: Never Used  Substance and Sexual Activity  . Alcohol use: No    Alcohol/week: 0.0 oz  . Drug use: No  . Sexual activity: Not on file  Lifestyle  . Physical activity:    Days per week: Not on file    Minutes per session: Not on file  . Stress: Not on file  Relationships  . Social connections:    Talks on phone: Not on file    Gets together: Not on file    Attends religious service: Not on file    Active member of club or organization: Not on file    Attends meetings of clubs or organizations: Not on file    Relationship status: Not on file  . Intimate partner violence:    Fear of current or ex partner: Not on file    Emotionally abused: Not on file    Physically abused: Not on file    Forced sexual activity: Not on file  Other Topics Concern  . Not on file  Social History Narrative   06/23/17 lives with sister, [name]   Regular exercise-no   Caffeine Use-yes     PHYSICAL EXAM  Vitals:   06/23/17 1504  BP: (!) 148/90  Pulse: 75   Weight: 219 lb (99.3 kg)  Height: 5\' 3"  (1.6 m)   Body mass index is 38.79 kg/m.  Generalized: Well developed, morbidly obese in no acute distress  Head: normocephalic and atraumatic,. Oropharynx benign Mallopatti 3 Neck: Supple, circumference 18 inches Musculoskeletal: No deformity   Neurological examination   Mentation: Alert oriented to time, place, history taking. Attention span and concentration appropriate. Recent and remote memory intact.  Follows all commands speech with dysphonia and sparse wordflow  Cranial nerve II-XII: Pupils were equal round reactive to light extraocular movements were full, visual field were full on confrontational test. Facial sensation and strength were normal. hearing was intact to finger rubbing bilaterally. Uvula tongue midline. head turning and shoulder shrug were normal and symmetric.Tongue protrusion into cheek strength was normal. Motor: normal bulk and tone, full strength in the BUE, BLE, Sensory: normal and symmetric to light touch,  Coordination: finger-nose-finger,no dysmetria Reflexes: Symmetric upper and lower, plantar responses were flexor bilaterally. Gait and Station: Rising up from seated position without assistance, normal stance,  moderate stride, good arm swing, smooth turning, able to perform tiptoe, and heel walking without difficulty. Unable to tandem walk  DIAGNOSTIC DATA (LABS, IMAGING, TESTING) - I reviewed patient records, labs, notes, testing and imaging myself where available.  Lab Results  Component Value Date   WBC 10.4 02/04/2017   HGB 14.1 02/04/2017   HCT 43.3  02/04/2017   MCV 98.0 02/04/2017   PLT 264.0 02/04/2017      Component Value Date/Time   NA 138 02/04/2017 1722   NA 144 04/04/2015 1600   NA 137 02/21/2012 1828   K 4.1 02/04/2017 1722   K 4.3 02/21/2012 1828   CL 102 02/04/2017 1722   CL 104 02/21/2012 1828   CO2 31 02/04/2017 1722   CO2 25 02/21/2012 1828   GLUCOSE 114 (H) 02/04/2017 1722    GLUCOSE 145 (H) 02/21/2012 1828   BUN 15 02/04/2017 1722   BUN 8 04/04/2015 1600   BUN 8 02/21/2012 1828   CREATININE 0.95 02/04/2017 1722   CREATININE 1.14 02/21/2012 1828   CALCIUM 9.1 02/04/2017 1722   CALCIUM 9.0 02/21/2012 1828   PROT 6.7 02/04/2017 1722   PROT 6.0 04/04/2015 1600   PROT 6.4 04/25/2011 0854   ALBUMIN 3.9 02/04/2017 1722   ALBUMIN 4.0 04/04/2015 1600   ALBUMIN 3.4 04/25/2011 0854   AST 20 02/04/2017 1722   AST 18 04/25/2011 0854   ALT 19 02/04/2017 1722   ALT 25 04/25/2011 0854   ALKPHOS 75 02/04/2017 1722   ALKPHOS 45 (L) 04/25/2011 0854   BILITOT 0.5 02/04/2017 1722   BILITOT 0.4 04/04/2015 1600   BILITOT 0.4 04/25/2011 0854   GFRNONAA 80 04/04/2015 1600   GFRNONAA 52 (L) 02/21/2012 1828   GFRAA 92 04/04/2015 1600   GFRAA >60 02/21/2012 1828   Lab Results  Component Value Date   CHOL 192 01/06/2017   HDL 37.20 (L) 01/06/2017   LDLCALC 120 (H) 01/06/2017   LDLDIRECT 122.0 02/13/2016   TRIG 178.0 (H) 01/06/2017   CHOLHDL 5 01/06/2017   Lab Results  Component Value Date   HGBA1C 5.5 01/06/2017    Lab Results  Component Value Date   TSH 4.83 (H) 01/06/2017      ASSESSMENT AND PLAN  67 y.o. year old female  has a past medical history of Myasthenia gravis ([place]). And obstructive sleep apnea. To follow-up for CPAP compliance.Datadated 05/23/2017-06/21/2017 shows compliance greater than 4 hours at 100%.  Average usage 10 hours 15 minutes.  Set pressure 5 cm.  EPR level 3 AHI 0.4 leaks 95th percentile at 5.2.       Plan: CPAP compliance excellent at 100% Continue same settings Continue Mestinon at current dose.  No symptoms with reduction of  methotrexate for myasthenia gravis at last visit to 6 pills or 15mg  weekly will reduce further at next visit History of seizure disorder no seizures in many years and has been weaned from seizure medication Follow-up in 6 months I spent  20 min in total face to face time with the patient and sister more  than 50% of which was spent counseling and coordination of care, reviewing test results reviewing medications and discussing and reviewing the diagnosis of myasthenia gravis and symptoms to report and obstructive sleep apnea. Reviewed  compliance report with them. Dennie Bible, [facility], [facility], APRN  [facility] [address] [place], [place] [address] [phone]

## 2017-06-23 ENCOUNTER — Ambulatory Visit (INDEPENDENT_AMBULATORY_CARE_PROVIDER_SITE_OTHER): Payer: PPO | Admitting: Nurse Practitioner

## 2017-06-23 ENCOUNTER — Encounter: Payer: Self-pay | Admitting: Nurse Practitioner

## 2017-06-23 VITALS — BP 148/90 | HR 75 | Ht 63.0 in | Wt 219.0 lb

## 2017-06-23 DIAGNOSIS — G4733 Obstructive sleep apnea (adult) (pediatric): Secondary | ICD-10-CM | POA: Diagnosis not present

## 2017-06-23 DIAGNOSIS — G7000 Myasthenia gravis without (acute) exacerbation: Secondary | ICD-10-CM | POA: Diagnosis not present

## 2017-06-23 DIAGNOSIS — Z9989 Dependence on other enabling machines and devices: Secondary | ICD-10-CM

## 2017-06-23 NOTE — Patient Instructions (Signed)
CPAP compliance excellent at 100% Continue same settings Continue Mestinon and methotrexate for myasthenia gravis  History of seizure disorder no seizures in many years and has been weaned from seizure medication Follow-up in 6 months

## 2017-06-25 ENCOUNTER — Encounter: Payer: Self-pay | Admitting: Internal Medicine

## 2017-06-25 ENCOUNTER — Ambulatory Visit (INDEPENDENT_AMBULATORY_CARE_PROVIDER_SITE_OTHER): Payer: PPO | Admitting: Internal Medicine

## 2017-06-25 VITALS — BP 132/88 | HR 82 | Temp 98.3°F | Resp 18 | Wt 217.0 lb

## 2017-06-25 DIAGNOSIS — C439 Malignant melanoma of skin, unspecified: Secondary | ICD-10-CM

## 2017-06-25 DIAGNOSIS — Z1231 Encounter for screening mammogram for malignant neoplasm of breast: Secondary | ICD-10-CM

## 2017-06-25 DIAGNOSIS — R739 Hyperglycemia, unspecified: Secondary | ICD-10-CM

## 2017-06-25 DIAGNOSIS — G7000 Myasthenia gravis without (acute) exacerbation: Secondary | ICD-10-CM

## 2017-06-25 DIAGNOSIS — E7800 Pure hypercholesterolemia, unspecified: Secondary | ICD-10-CM

## 2017-06-25 DIAGNOSIS — I10 Essential (primary) hypertension: Secondary | ICD-10-CM | POA: Diagnosis not present

## 2017-06-25 DIAGNOSIS — G4730 Sleep apnea, unspecified: Secondary | ICD-10-CM

## 2017-06-25 DIAGNOSIS — E669 Obesity, unspecified: Secondary | ICD-10-CM | POA: Diagnosis not present

## 2017-06-25 DIAGNOSIS — R109 Unspecified abdominal pain: Secondary | ICD-10-CM

## 2017-06-25 DIAGNOSIS — Z1239 Encounter for other screening for malignant neoplasm of breast: Secondary | ICD-10-CM

## 2017-06-25 DIAGNOSIS — G4710 Hypersomnia, unspecified: Secondary | ICD-10-CM | POA: Diagnosis not present

## 2017-06-25 MED ORDER — MUPIROCIN 2 % EX OINT: 1.0000 "application " | TOPICAL_OINTMENT | Freq: Two times a day (BID) | CUTANEOUS | 0 refills | Status: DC

## 2017-06-25 NOTE — Progress Notes (Signed)
mammo

## 2017-06-25 NOTE — Progress Notes (Signed)
Patient ID: Madison Burns, female   DOB: 12/19/1950, 67 y.o.   MRN: [id number]   Subjective:    Patient ID: Madison Burns, female    DOB: [birth year], 67 y.o.   MRN: [id number]  HPI  Patient here for a scheduled follow up.  She is accompanied by her sister.  History obtained from both of them.  Just saw Dr Ubaldo Glassing in 04/2017.  Felt things were stable.  Saw GI 05/2017.  Planning for EGD and colonoscopy next week.  Also saw neurology 06/23/17 - on 11m MTX q week.  Planning to decrease more.  Sister feels may be irritating her stomach.  Discussed the need to stay active.  No chest pain.  No sob.  Bowels moving.  Due to f/u with Dr KNicolasa Duckingtomorrow and due to see the dentist 07/2017.  Cough is better.     Past Medical History:  Diagnosis Date  . GERD (gastroesophageal reflux disease)   . History of seizure disorder   . Hypercholesterolemia   . Hypertension   . Irritable bowel syndrome   [place]   . OSA on CPAP    Past Surgical History:  Procedure Laterality Date  . ABDOMINAL HYSTERECTOMY    . APPENDECTOMY  1999  . CHOLECYSTECTOMY  2013  . TONSILLECTOMY     Family History  Problem Relation Age of Onset  . CVA Maternal Grandfather   . Colon cancer Maternal Grandfather        stomach/colon  . Heart disease Father        myocardial infarction  . Hyperlipidemia Father   . Hyperlipidemia Sister   . Bone cancer Unknown        cousin  . Alzheimer's disease Mother        maternal aunts  . Myasthenia gravis Brother        ocular  . Breast cancer Neg Hx    Social History   Socioeconomic History  . Marital status: Single    Spouse name: Not on file  . Number of children: 0  . Years of education: Special Ed  . Highest education level: Not on file  Occupational History  . Occupation: Unemployed  Social Needs  . Financial resource strain: Not on file  . Food insecurity:    Worry: Not on file    Inability: Not on file  . Transportation needs:    Medical: Not on file      Non-medical: Not on file  Tobacco Use  . Smoking status: Never Smoker  . Smokeless tobacco: Never Used  Substance and Sexual Activity  . Alcohol use: No    Alcohol/week: 0.0 oz  . Drug use: No  . Sexual activity: Not on file  Lifestyle  . Physical activity:    Days per week: Not on file    Minutes per session: Not on file  . Stress: Not on file  Relationships  . Social connections:    Talks on phone: Not on file    Gets together: Not on file    Attends religious service: Not on file    Active member of club or organization: Not on file    Attends meetings of clubs or organizations: Not on file    Relationship status: Not on file  Other Topics Concern  . Not on file  Social History Narrative   06/23/17 lives with sister, NIzora Gala  Regular exercise-no   Caffeine Use-yes    Outpatient Encounter Medications as of 06/25/2017  Medication Sig  . benzonatate (TESSALON PERLES) 100 MG capsule Take 1 capsule (100 mg total) by mouth 3 (three) times daily.  [name] levothyroxine (SYNTHROID, LEVOTHROID) 75 MCG tablet Take 1 tablet (75 mcg total) by mouth daily before breakfast.  . methotrexate (RHEUMATREX) 2.5 MG tablet Take 6 tablets (15 mg total) by mouth once a week. Caution: Protect from light.  [name] omeprazole (PRILOSEC) 20 MG capsule Take 1 capsule (20 mg total) by mouth daily.  [name] pyridostigmine (MESTINON) 60 MG tablet TAKE 1 TABLET BY MOUTH AT  8 AM, ONE AT LUNCH AND ONE AT 8PM  . risperiDONE (RISPERDAL) 1 MG tablet Take 1 mg by mouth at bedtime.  . sucralfate (CARAFATE) 1 g tablet Take 1 g by mouth 2 (two) times daily.  [name] tolterodine (DETROL Burns) 4 MG 24 hr capsule TAKE 1 CAPSULE BY MOUTH EVERY MORNING  . mupirocin ointment (BACTROBAN) 2 % Place 1 application into the nose 2 (two) times daily.   No facility-administered encounter medications on file as of 06/25/2017.     Review of Systems  Constitutional: Negative for appetite change and unexpected weight change.  HENT: Negative for  congestion and sinus pressure.   Respiratory: Negative for cough, chest tightness and shortness of breath.   Cardiovascular: Negative for chest pain, palpitations and leg swelling.  Gastrointestinal: Negative for diarrhea and vomiting.       Some abdominal discomfort.   Genitourinary: Negative for difficulty urinating and dysuria.  Musculoskeletal: Negative for joint swelling and myalgias.  Skin: Negative for color change and rash.  Neurological: Negative for dizziness, light-headedness and headaches.  Psychiatric/Behavioral: Negative for agitation and dysphoric mood.       Objective:     Blood pressure rechecked by me:  128/78  Physical Exam  Constitutional: She appears well-developed and well-nourished. No distress.  HENT:  Nose: Nose normal.  Mouth/Throat: Oropharynx is clear and moist.  Neck: Neck supple. No thyromegaly present.  Cardiovascular: Normal rate and regular rhythm.  Pulmonary/Chest: Breath sounds normal. No respiratory distress. She has no wheezes.  Abdominal: Soft. Bowel sounds are normal. There is no tenderness.  Musculoskeletal: She exhibits no edema or tenderness.  Lymphadenopathy:    She has no cervical adenopathy.  Skin: No rash noted. No erythema.  Psychiatric: She has a normal mood and affect. Her behavior is normal.    BP 132/88 (BP Location: Left Arm, Patient Position: Sitting, Cuff Size: Large)   Pulse 82   Temp 98.3 F (36.8 C) (Oral)   Resp 18   Wt 217 lb (98.4 kg)   SpO2 97%   BMI 38.44 kg/m  Wt Readings from Last 3 Encounters:  06/25/17 217 lb (98.4 kg)  06/23/17 219 lb (99.3 kg)  06/02/17 217 lb (98.4 kg)     Lab Results  Component Value Date   WBC 10.4 02/04/2017   HGB 14.1 02/04/2017   HCT 43.3 02/04/2017   PLT 264.0 02/04/2017   GLUCOSE 114 (H) 02/04/2017   CHOL 192 01/06/2017   TRIG 178.0 (H) 01/06/2017   HDL 37.20 (L) 01/06/2017   LDLDIRECT 122.0 02/13/2016   LDLCALC 120 (H) 01/06/2017   ALT 19 02/04/2017   AST 20  02/04/2017   NA 138 02/04/2017   K 4.1 02/04/2017   CL 102 02/04/2017   CREATININE 0.95 02/04/2017   BUN 15 02/04/2017   CO2 31 02/04/2017   TSH 4.83 (H) 01/06/2017   HGBA1C 5.5 01/06/2017    US Abdomen Complete  Result Date: 05/01/2017 CLINICAL  DATA:  Generalized abdominal pain for 1 month. EXAM: ABDOMEN ULTRASOUND COMPLETE COMPARISON:  Ultrasound of February 07, 2011. FINDINGS: Gallbladder: Status post cholecystectomy. Common bile duct: Diameter: 10 mm which is within normal limits for post cholecystectomy status. Liver: No focal lesion identified. Increased echogenicity of hepatic parenchyma is noted suggesting fatty infiltration or other diffuse hepatocellular disease. Portal vein is patent on color Doppler imaging with normal direction of blood flow towards the liver. IVC: No abnormality visualized. Pancreas: Visualized portion unremarkable. Spleen: Size and appearance within normal limits. Right Kidney: Length: 10.7 cm. Echogenicity within normal limits. No mass or hydronephrosis visualized. Left Kidney: Length: 12.7 cm. 2 cm cyst is seen medially. Echogenicity within normal limits. No mass or hydronephrosis visualized. Abdominal aorta: No aneurysm visualized. Other findings: None. IMPRESSION: Status post cholecystectomy. Increased echogenicity of hepatic parenchyma is noted suggesting fatty infiltration or other diffuse hepatocellular disease. No other significant abnormality seen in the abdomen. Electronically Signed   By: Marijo Conception, M.D.   On: 05/01/2017 10:12       Assessment & Plan:   Problem List Items Addressed This Visit    Abdominal pain    Some intermittent abdominal pain.  Abdominal ultrasound as outlined. Saw GI. Planning for EGD and colonoscopy next week.        Hypercholesterolemia (Chronic)    Low cholesterol diet and exercise.  Follow lipid panel.        Hyperglycemia    Low carb diet and exercise.  Follow met b and a1c.        Hypersomnia with sleep apnea      Using cpap regularly.        Hypertension    Blood pressure as outlined.  Doing well.  Follow.        Melanoma ([place])    Followed by dermatology.       Myasthenia gravis ([place])    Followed by neurology.  Decreasing MTX.  Follow.        Obesity (BMI 30-39.9)    Discussed diet and exercise.  Follow.        Other Visit Diagnoses    Breast cancer screening    -  Primary   Relevant Orders   MM Digital Screening       Einar Pheasant, MD

## 2017-06-26 DIAGNOSIS — F79 Unspecified intellectual disabilities: Secondary | ICD-10-CM | POA: Diagnosis not present

## 2017-06-26 DIAGNOSIS — G4700 Insomnia, unspecified: Secondary | ICD-10-CM | POA: Diagnosis not present

## 2017-06-26 DIAGNOSIS — F39 Unspecified mood [affective] disorder: Secondary | ICD-10-CM | POA: Diagnosis not present

## 2017-06-28 ENCOUNTER — Encounter: Payer: Self-pay | Admitting: Internal Medicine

## 2017-06-28 NOTE — Assessment & Plan Note (Signed)
Using cpap regularly   

## 2017-06-28 NOTE — Assessment & Plan Note (Signed)
Some intermittent abdominal pain.  Abdominal ultrasound as outlined. Saw GI. Planning for EGD and colonoscopy next week.

## 2017-06-28 NOTE — Assessment & Plan Note (Signed)
Blood pressure as outlined.  Doing well.  Follow.   

## 2017-06-28 NOTE — Assessment & Plan Note (Signed)
Followed by dermatology

## 2017-06-28 NOTE — Assessment & Plan Note (Signed)
Low carb diet and exercise.  Follow met b and a1c.   

## 2017-06-28 NOTE — Assessment & Plan Note (Signed)
Followed by neurology.  Decreasing MTX.  Follow.

## 2017-06-28 NOTE — Assessment & Plan Note (Signed)
Discussed diet and exercise.  Follow.  

## 2017-06-28 NOTE — Assessment & Plan Note (Signed)
Low cholesterol diet and exercise.  Follow lipid panel.   

## 2017-06-29 ENCOUNTER — Encounter: Payer: Self-pay | Admitting: *Deleted

## 2017-06-29 ENCOUNTER — Other Ambulatory Visit: Payer: Self-pay | Admitting: Internal Medicine

## 2017-06-29 MED ORDER — TOLTERODINE TARTRATE ER 4 MG PO CP24
4.0000 mg | ORAL_CAPSULE | Freq: Every morning | ORAL | 0 refills | Status: DC
Start: 2017-06-29 — End: 2017-09-28

## 2017-06-29 NOTE — Telephone Encounter (Signed)
Copied from [place] [id number]. Topic: General - Other >> Jun 29, 2017 10:04 AM Oneta Rack wrote: Caller name: [name] Relation to pt:  sister  Call back number: [phone] Pharmacy: [facility], [place]. [phone] (Phone) [phone] (Fax)  Reason for call:  Requesting tolterodine (DETROL LA) 4 MG 24 hr capsule, advised to contact PCP office due to the pharmacy being new, aware refill request will take 48 to 72 hour turn around, please advise

## 2017-06-30 ENCOUNTER — Encounter
Admission: RE | Disposition: A | Discharge status: Home / Self Care | Payer: Self-pay | Source: Ambulatory Visit | Attending: Internal Medicine

## 2017-06-30 ENCOUNTER — Ambulatory Visit
Admission: RE | Admit: 2017-06-30 | Discharge: 2017-06-30 | Disposition: A | Discharge status: Home / Self Care | Payer: PPO | Source: Ambulatory Visit | Attending: Internal Medicine | Admitting: Internal Medicine

## 2017-06-30 ENCOUNTER — Ambulatory Visit: Payer: PPO | Admitting: Certified Registered"

## 2017-06-30 ENCOUNTER — Encounter: Payer: Self-pay | Admitting: Certified Registered"

## 2017-06-30 DIAGNOSIS — G7000 Myasthenia gravis without (acute) exacerbation: Secondary | ICD-10-CM | POA: Insufficient documentation

## 2017-06-30 DIAGNOSIS — R1013 Epigastric pain: Secondary | ICD-10-CM | POA: Insufficient documentation

## 2017-06-30 DIAGNOSIS — G4733 Obstructive sleep apnea (adult) (pediatric): Secondary | ICD-10-CM | POA: Diagnosis not present

## 2017-06-30 DIAGNOSIS — Z881 Allergy status to other antibiotic agents status: Secondary | ICD-10-CM | POA: Insufficient documentation

## 2017-06-30 DIAGNOSIS — Z79899 Other long term (current) drug therapy: Secondary | ICD-10-CM | POA: Diagnosis not present

## 2017-06-30 DIAGNOSIS — K589 Irritable bowel syndrome without diarrhea: Secondary | ICD-10-CM | POA: Diagnosis not present

## 2017-06-30 DIAGNOSIS — K640 First degree hemorrhoids: Secondary | ICD-10-CM | POA: Diagnosis not present

## 2017-06-30 DIAGNOSIS — K210 Gastro-esophageal reflux disease with esophagitis: Secondary | ICD-10-CM | POA: Diagnosis not present

## 2017-06-30 DIAGNOSIS — Z79890 Hormone replacement therapy (postmenopausal): Secondary | ICD-10-CM | POA: Diagnosis not present

## 2017-06-30 DIAGNOSIS — I10 Essential (primary) hypertension: Secondary | ICD-10-CM | POA: Insufficient documentation

## 2017-06-30 DIAGNOSIS — K5730 Diverticulosis of large intestine without perforation or abscess without bleeding: Secondary | ICD-10-CM | POA: Diagnosis not present

## 2017-06-30 DIAGNOSIS — F70 Mild intellectual disabilities: Secondary | ICD-10-CM | POA: Insufficient documentation

## 2017-06-30 DIAGNOSIS — K5790 Diverticulosis of intestine, part unspecified, without perforation or abscess without bleeding: Secondary | ICD-10-CM | POA: Diagnosis not present

## 2017-06-30 DIAGNOSIS — K228 Other specified diseases of esophagus: Secondary | ICD-10-CM | POA: Diagnosis not present

## 2017-06-30 DIAGNOSIS — R1310 Dysphagia, unspecified: Secondary | ICD-10-CM | POA: Diagnosis not present

## 2017-06-30 DIAGNOSIS — K2950 Unspecified chronic gastritis without bleeding: Secondary | ICD-10-CM | POA: Insufficient documentation

## 2017-06-30 DIAGNOSIS — Z1211 Encounter for screening for malignant neoplasm of colon: Secondary | ICD-10-CM | POA: Diagnosis not present

## 2017-06-30 DIAGNOSIS — K648 Other hemorrhoids: Secondary | ICD-10-CM | POA: Diagnosis not present

## 2017-06-30 DIAGNOSIS — K219 Gastro-esophageal reflux disease without esophagitis: Secondary | ICD-10-CM | POA: Diagnosis not present

## 2017-06-30 DIAGNOSIS — E039 Hypothyroidism, unspecified: Secondary | ICD-10-CM | POA: Diagnosis not present

## 2017-06-30 DIAGNOSIS — Z8669 Personal history of other diseases of the nervous system and sense organs: Secondary | ICD-10-CM | POA: Diagnosis not present

## 2017-06-30 HISTORY — PX: COLONOSCOPY WITH PROPOFOL: SHX5780

## 2017-06-30 HISTORY — PX: ESOPHAGOGASTRODUODENOSCOPY (EGD) WITH PROPOFOL: SHX5813

## 2017-06-30 LAB — HM COLONOSCOPY

## 2017-06-30 SURGERY — COLONOSCOPY WITH PROPOFOL
Anesthesia: General

## 2017-06-30 MED ORDER — PROPOFOL 10 MG/ML IV BOLUS
INTRAVENOUS | Status: DC | PRN
  Administered 2017-06-30 (×3): 50 mg via INTRAVENOUS
  Administered 2017-06-30: 100 mg via INTRAVENOUS
  Administered 2017-06-30 (×2): 50 mg via INTRAVENOUS

## 2017-06-30 MED ORDER — SODIUM CHLORIDE 0.9 % IV SOLN
INTRAVENOUS | Status: DC
  Administered 2017-06-30 (×2): via INTRAVENOUS

## 2017-06-30 MED ORDER — GLYCOPYRROLATE 0.2 MG/ML IJ SOLN
INTRAMUSCULAR | Status: AC
  Filled 2017-06-30: qty 1

## 2017-06-30 MED ORDER — GLYCOPYRROLATE 0.2 MG/ML IJ SOLN
INTRAMUSCULAR | Status: DC | PRN
  Administered 2017-06-30 (×2): 0.1 mg via INTRAVENOUS

## 2017-06-30 MED ORDER — MIDAZOLAM HCL 2 MG/2ML IJ SOLN
INTRAMUSCULAR | Status: AC
  Filled 2017-06-30: qty 2

## 2017-06-30 MED ORDER — MIDAZOLAM HCL 5 MG/5ML IJ SOLN
INTRAMUSCULAR | Status: DC | PRN
  Administered 2017-06-30: 2 mg via INTRAVENOUS

## 2017-06-30 MED ORDER — LIDOCAINE HCL (PF) 2 % IJ SOLN
INTRAMUSCULAR | Status: AC
  Filled 2017-06-30: qty 10

## 2017-06-30 MED ORDER — PROPOFOL 500 MG/50ML IV EMUL
INTRAVENOUS | Status: AC
  Filled 2017-06-30: qty 50

## 2017-06-30 MED ORDER — LIDOCAINE HCL (CARDIAC) PF 100 MG/5ML IV SOSY
PREFILLED_SYRINGE | INTRAVENOUS | Status: DC | PRN
  Administered 2017-06-30: 20 mg via INTRAVENOUS
  Administered 2017-06-30: 40 mg via INTRAVENOUS

## 2017-06-30 NOTE — Anesthesia Post-op Follow-up Note (Signed)
Anesthesia QCDR form completed.        

## 2017-06-30 NOTE — Transfer of Care (Signed)
Immediate Anesthesia Transfer of Care Note  Patient: Madison Burns  Procedure(s) Performed: COLONOSCOPY WITH PROPOFOL (N/A ) ESOPHAGOGASTRODUODENOSCOPY (EGD) WITH PROPOFOL (N/A )  Patient Location: PACU and Endoscopy Unit  Anesthesia Type:General  Level of Consciousness: awake, alert , oriented and patient cooperative  Airway & Oxygen Therapy: Patient Spontanous Breathing  Post-op Assessment: Report given to RN, Post -op Vital signs reviewed and stable and Patient moving all extremities  Post vital signs: Reviewed and stable  Last Vitals:  Vitals Value Taken Time  BP 118/73 06/30/2017  1:34 PM  Temp 36.1 C 06/30/2017  1:33 PM  Pulse 69 06/30/2017  1:34 PM  Resp 24 06/30/2017  1:34 PM  SpO2 100 % 06/30/2017  1:34 PM  Vitals shown include unvalidated device data.  Last Pain:  Vitals:   06/30/17 1333  TempSrc: Tympanic  PainSc:          Complications: No apparent anesthesia complications

## 2017-06-30 NOTE — Anesthesia Preprocedure Evaluation (Addendum)
Anesthesia Evaluation  Patient identified by MRN, date of birth, ID band Patient awake    Reviewed: Allergy & Precautions, H&P , NPO status , Patient's Chart, lab work & pertinent test results, reviewed documented beta blocker date and time   History of Anesthesia Complications Negative for: history of anesthetic complications  Airway Mallampati: IV  TM Distance: >3 FB Neck ROM: full  Mouth opening: Limited Mouth Opening  Dental  (+) Dental Advidsory Given, Missing, Poor Dentition   Pulmonary neg shortness of breath, sleep apnea and Continuous Positive Airway Pressure Ventilation , neg COPD, neg recent URI,           Cardiovascular Exercise Tolerance: Good hypertension, (-) angina(-) CAD, (-) Past MI, (-) Cardiac Stents and (-) CABG (-) dysrhythmias (-) Valvular Problems/Murmurs     Neuro/Psych Seizures -,   Neuromuscular disease (Myasthenia Gravis) negative neurological ROS  negative psych ROS   GI/Hepatic GERD  ,NAFLD   Endo/Other  neg diabetesHypothyroidism   Renal/GU negative Renal ROS  negative genitourinary   Musculoskeletal   Abdominal   Peds  Hematology negative hematology ROS (+)   Anesthesia Other Findings Past Medical History: No date: GERD (gastroesophageal reflux disease) No date: History of seizure disorder No date: Hypercholesterolemia No date: Hypertension No date: Irritable bowel syndrome No date: Myasthenia gravis ([facility]) No date: OSA on CPAP   Reproductive/Obstetrics negative OB ROS                            Anesthesia Physical Anesthesia Plan  ASA: III  Anesthesia Plan: General   Post-op Pain Management:    Induction: Intravenous  PONV Risk Score and Plan: 3 and Propofol infusion  Airway Management Planned: Nasal Cannula  Additional Equipment:   Intra-op Plan:   Post-operative Plan:   Informed Consent: I have reviewed the patients History and  Physical, chart, labs and discussed the procedure including the risks, benefits and alternatives for the proposed anesthesia with the patient or authorized representative who has indicated his/her understanding and acceptance.   Dental Advisory Given  Plan Discussed with: Anesthesiologist, CRNA and Surgeon  Anesthesia Plan Comments:         Anesthesia Quick Evaluation

## 2017-06-30 NOTE — Op Note (Signed)
[facility] Gastroenterology Patient Name: Madison Burns Procedure Date: 06/30/2017 12:46 PM MRN: [id number] Account #: [id number] Date of Birth: October 02, 1950 Admit Type: Outpatient Age: 67 Room: [facility] ENDO ROOM 2 Gender: Female Note Status: Finalized Procedure:            Upper GI endoscopy Indications:          Epigastric abdominal pain, Dysphagia Providers:            Benay Pike. Alice Reichert MD, MD Referring MD:         Einar Pheasant, MD (Referring MD) Medicines:            Propofol per Anesthesia Complications:        No immediate complications. Procedure:            Pre-Anesthesia Assessment:                       - The risks and benefits of the procedure and the                        sedation options and risks were discussed with the                        patient. All questions were answered and informed                        consent was obtained.                       - Patient identification and proposed procedure were                        verified prior to the procedure by the nurse. The                        procedure was verified in the procedure room.                       - ASA Grade Assessment: III - A patient with severe                        systemic disease.                       - After reviewing the risks and benefits, the patient                        was deemed in satisfactory condition to undergo the                        procedure.                       After obtaining informed consent, the endoscope was                        passed under direct vision. Throughout the procedure,                        the patient's blood pressure, pulse, and oxygen  saturations were monitored continuously. The Endoscope                        was introduced through the mouth, and advanced to the                        third part of duodenum. The upper GI endoscopy was                        accomplished without difficulty. The  patient tolerated                        the procedure well. Findings:      The Z-line was irregular and was found in the distal esophagus. Mucosa       was biopsied with a cold forceps for histology. One specimen bottle was       sent to pathology.      The exam was otherwise without abnormality.      There is no endoscopic evidence of stenosis, stricture, signs of       external compression or abnormal motility in the distal esophagus.      Localized mildly erythematous mucosa without bleeding was found in the       gastric antrum. Biopsies were taken with a cold forceps for Helicobacter       pylori testing.      The cardia and gastric fundus were normal on retroflexion.      The examined duodenum was normal. Impression:           - Z-line irregular, in the distal esophagus. Biopsied.                       - The examination was otherwise normal.                       - Erythematous mucosa in the antrum. Biopsied.                       - Normal examined duodenum. Recommendation:       - Await pathology results.                       - Proceed with colonoscopy Procedure Code(s):    --- Professional ---                       [phone], Esophagogastroduodenoscopy, flexible, transoral;                        with biopsy, single or multiple Diagnosis Code(s):    --- Professional ---                       R13.10, Dysphagia, unspecified                       R10.13, Epigastric pain                       K31.89, Other diseases of stomach and duodenum                       K22.8, Other specified diseases of esophagus CPT copyright 2017 American Medical Association. All rights  reserved. The codes documented in this report are preliminary and upon coder review may  be revised to meet current compliance requirements. Efrain Sella MD, MD 06/30/2017 1:15:41 PM This report has been signed electronically. Number of Addenda: 0 Note Initiated On: 06/30/2017 12:46 PM      [facility]

## 2017-06-30 NOTE — Op Note (Signed)
[facility] Gastroenterology Patient Name: Madison Burns Procedure Date: 06/30/2017 12:46 PM MRN: [id number] Account #: [id number] Date of Birth: February 16, 1951 Admit Type: Outpatient Age: 67 Room: [facility] ENDO ROOM 2 Gender: Female Note Status: Finalized Procedure:            Colonoscopy Indications:          Screening for colorectal malignant neoplasm Providers:            Benay Pike. Alice Reichert MD, MD Referring MD:         Einar Pheasant, MD (Referring MD) Medicines:            Propofol per Anesthesia Complications:        No immediate complications. Procedure:            Pre-Anesthesia Assessment:                       - The risks and benefits of the procedure and the                        sedation options and risks were discussed with the                        patient. All questions were answered and informed                        consent was obtained.                       - Patient identification and proposed procedure were                        verified prior to the procedure by the nurse. The                        procedure was verified in the procedure room.                       - ASA Grade Assessment: III - A patient with severe                        systemic disease.                       - After reviewing the risks and benefits, the patient                        was deemed in satisfactory condition to undergo the                        procedure.                       After obtaining informed consent, the colonoscope was                        passed under direct vision. Throughout the procedure,                        the patient's blood pressure, pulse, and oxygen  saturations were monitored continuously. The                        Colonoscope was introduced through the anus and                        advanced to the the cecum, identified by appendiceal                        orifice and ileocecal valve. The colonoscopy was                    performed without difficulty. The patient tolerated the                        procedure well. The quality of the bowel preparation                        was good. Findings:      The perianal and digital rectal examinations were normal. Pertinent       negatives include normal sphincter tone and no palpable rectal lesions.      Many medium-mouthed diverticula were found in the sigmoid colon.      Non-bleeding internal hemorrhoids were found during retroflexion. The       hemorrhoids were Grade I (internal hemorrhoids that do not prolapse).      The exam was otherwise without abnormality. Impression:           - Diverticulosis in the sigmoid colon.                       - Non-bleeding internal hemorrhoids.                       - The examination was otherwise normal.                       - No specimens collected. Recommendation:       - Patient has a contact number available for                        emergencies. The signs and symptoms of potential                        delayed complications were discussed with the patient.                        Return to normal activities tomorrow. Written discharge                        instructions were provided to the patient.                       - Resume previous diet.                       - Continue present medications.                       - Repeat colonoscopy in 10 years for screening purposes.                       -  Await pathology from EGD performed today.                       - Return to nurse practitioner in 6 months.                       - The findings and recommendations were discussed with                        the patient and their family. Procedure Code(s):    --- Professional ---                       D5520, Colorectal cancer screening; colonoscopy on                        individual not meeting criteria for high risk Diagnosis Code(s):    --- Professional ---                       K57.30, Diverticulosis  of large intestine without                        perforation or abscess without bleeding                       K64.0, First degree hemorrhoids                       Z12.11, Encounter for screening for malignant neoplasm                        of colon CPT copyright 2017 American Medical Association. All rights reserved. The codes documented in this report are preliminary and upon coder review may  be revised to meet current compliance requirements. Efrain Sella MD, MD 06/30/2017 1:33:10 PM This report has been signed electronically. Number of Addenda: 0 Note Initiated On: 06/30/2017 12:46 PM Scope Withdrawal Time: 0 hours 7 minutes 50 seconds  Total Procedure Duration: 0 hours 12 minutes 46 seconds       [facility]

## 2017-06-30 NOTE — Anesthesia Postprocedure Evaluation (Signed)
Anesthesia Post Note  Patient: HAZELLE WOOLLARD  Procedure(s) Performed: COLONOSCOPY WITH PROPOFOL (N/A ) ESOPHAGOGASTRODUODENOSCOPY (EGD) WITH PROPOFOL (N/A )  Patient location during evaluation: Endoscopy Anesthesia Type: General Level of consciousness: awake and alert, oriented and patient cooperative Pain management: satisfactory to patient Respiratory status: spontaneous breathing and respiratory function stable Cardiovascular status: blood pressure returned to baseline and stable Postop Assessment: no headache, no backache, patient able to bend at knees, no apparent nausea or vomiting, adequate PO intake and able to ambulate     Last Vitals:  Vitals:   06/30/17 1237 06/30/17 1333  BP: (!) 148/82 118/73  Pulse: 83   Resp: 20   Temp: 36.9 C (!) 36.1 C  SpO2: 99%     Last Pain:  Vitals:   06/30/17 1333  TempSrc: Tympanic  PainSc:                  Clovis Riley Kasir Hallenbeck

## 2017-06-30 NOTE — H&P (Signed)
Outpatient short stay form Pre-procedure 06/30/2017 9:50 AM Reilynn Lauro K. Alice Reichert, M.D.  Primary Physician: Einar Pheasant, MD  Reason for visit: Dysphagia, epigastric pain, colorectal cancer screening.  History of present illness: 67 year old female with a history of mild mental retardation and GERD presents for intermittent dysphasia to solid foods.  There is been no hematemesis or weight loss.  Patient is colonoscopy nave and is overdue for colorectal cancer screening in the form of colonoscopy.  The patient is a limited historian although her sister does provide much of the clinical history.   No current facility-administered medications for this encounter.   Current Outpatient Medications:  .  benzonatate (TESSALON PERLES) 100 MG capsule, Take 1 capsule (100 mg total) by mouth 3 (three) times daily., Disp: 90 capsule, Rfl: 3 .  levothyroxine (SYNTHROID, LEVOTHROID) 75 MCG tablet, Take 1 tablet (75 mcg total) by mouth daily before breakfast., Disp: 30 tablet, Rfl: 0 .  methotrexate (RHEUMATREX) 2.5 MG tablet, Take 6 tablets (15 mg total) by mouth once a week. Caution: Protect from light., Disp: 32 tablet, Rfl: 6 .  mupirocin ointment (BACTROBAN) 2 %, Place 1 application into the nose 2 (two) times daily., Disp: 22 g, Rfl: 0 .  omeprazole (PRILOSEC) 20 MG capsule, Take 1 capsule (20 mg total) by mouth daily., Disp: 90 capsule, Rfl: 1 .  pyridostigmine (MESTINON) 60 MG tablet, TAKE 1 TABLET BY MOUTH AT  8 AM, ONE AT LUNCH AND ONE AT 8PM, Disp: 90 tablet, Rfl: 6 .  risperiDONE (RISPERDAL) 1 MG tablet, Take 1 mg by mouth at bedtime., Disp: , Rfl:  .  sucralfate (CARAFATE) 1 g tablet, Take 1 g by mouth 2 (two) times daily., Disp: , Rfl:  .  tolterodine (DETROL LA) 4 MG 24 hr capsule, Take 1 capsule (4 mg total) by mouth every morning., Disp: 90 capsule, Rfl: 0  No medications prior to admission.     Allergies  Allergen Reactions  . Ciprofloxacin Other (See Comments)  . Levofloxacin Other  (See Comments)     Past Medical History:  Diagnosis Date  . GERD (gastroesophageal reflux disease)   . History of seizure disorder   . Hypercholesterolemia   . Hypertension   . Irritable bowel syndrome   . Myasthenia gravis ([place])   . OSA on CPAP     Review of systems:   Otherwise negative.   Physical Exam  Gen: Alert, oriented. Appears stated age.  HEENT: [place]/AT. PERRLA. Lungs: CTA, no wheezes. CV: RR nl S1, S2. Abd: soft, benign, no masses. BS+ Ext: No edema. Pulses 2+    Planned procedures: Proceed with EGD and colonoscopy.The patient understands the nature of the planned procedure, indications, risks, alternatives and potential complications including but not limited to bleeding, infection, perforation, damage to internal organs and possible oversedation/side effects from anesthesia. The patient agrees and gives consent to proceed.  Please refer to procedure notes for findings, recommendations and patient disposition/instructions.   Pearle Wandler K. Alice Reichert, M.D. Gastroenterology 06/30/2017  9:50 AM

## 2017-07-01 ENCOUNTER — Encounter: Payer: Self-pay | Admitting: Internal Medicine

## 2017-07-01 LAB — SURGICAL PATHOLOGY

## 2017-07-02 ENCOUNTER — Encounter: Payer: Self-pay | Admitting: Internal Medicine

## 2017-07-02 DIAGNOSIS — K2970 Gastritis, unspecified, without bleeding: Secondary | ICD-10-CM

## 2017-07-02 DIAGNOSIS — K209 Esophagitis, unspecified without bleeding: Secondary | ICD-10-CM | POA: Insufficient documentation

## 2017-07-09 ENCOUNTER — Other Ambulatory Visit: Payer: PPO

## 2017-07-14 ENCOUNTER — Other Ambulatory Visit (INDEPENDENT_AMBULATORY_CARE_PROVIDER_SITE_OTHER): Payer: PPO

## 2017-07-14 DIAGNOSIS — R739 Hyperglycemia, unspecified: Secondary | ICD-10-CM

## 2017-07-14 DIAGNOSIS — E7800 Pure hypercholesterolemia, unspecified: Secondary | ICD-10-CM

## 2017-07-14 DIAGNOSIS — E039 Hypothyroidism, unspecified: Secondary | ICD-10-CM

## 2017-07-14 DIAGNOSIS — R7989 Other specified abnormal findings of blood chemistry: Secondary | ICD-10-CM

## 2017-07-14 LAB — BASIC METABOLIC PANEL
BUN: 10 mg/dL (ref 6–23)
CHLORIDE: 105 meq/L (ref 96–112)
CO2: 30 meq/L (ref 19–32)
CREATININE: 0.88 mg/dL (ref 0.40–1.20)
Calcium: 8.9 mg/dL (ref 8.4–10.5)
GFR: 68.31 mL/min (ref >60.00)
Glucose, Bld: 111 mg/dL — ABNORMAL HIGH (ref 70–99)
Potassium: 3.9 mEq/L (ref 3.5–5.1)
Sodium: 141 mEq/L (ref 135–145)

## 2017-07-14 LAB — LIPID PANEL
Cholesterol: 208 mg/dL — ABNORMAL HIGH (ref 0–200)
HDL: 38.9 mg/dL — ABNORMAL LOW (ref >39.00)
LDL CALC: 130 mg/dL — AB (ref 0–99)
NONHDL: 168.97
Total CHOL/HDL Ratio: 5
Triglycerides: 197 mg/dL — ABNORMAL HIGH (ref 0.0–149.0)
VLDL: 39.4 mg/dL (ref 0.0–40.0)

## 2017-07-14 LAB — HEMOGLOBIN A1C: HEMOGLOBIN A1C: 5.5 % (ref 4.6–6.5)

## 2017-07-14 LAB — HEPATIC FUNCTION PANEL
ALT: 21 U/L (ref 0–35)
AST: 25 U/L (ref 0–37)
Albumin: 3.9 g/dL (ref 3.5–5.2)
Alkaline Phosphatase: 60 U/L (ref 39–117)
BILIRUBIN TOTAL: 0.6 mg/dL (ref 0.2–1.2)
Bilirubin, Direct: 0.1 mg/dL (ref 0.0–0.3)
Total Protein: 6.4 g/dL (ref 6.0–8.3)

## 2017-07-14 LAB — TSH: TSH: 2.49 u[IU]/mL (ref 0.35–4.50)

## 2017-07-15 ENCOUNTER — Other Ambulatory Visit: Payer: Self-pay | Admitting: Internal Medicine

## 2017-07-15 ENCOUNTER — Ambulatory Visit
Admission: RE | Admit: 2017-07-15 | Discharge: 2017-07-15 | Disposition: A | Discharge status: Home / Self Care | Payer: PPO | Source: Ambulatory Visit | Attending: Internal Medicine | Admitting: Internal Medicine

## 2017-07-15 DIAGNOSIS — Z1239 Encounter for other screening for malignant neoplasm of breast: Secondary | ICD-10-CM

## 2017-07-15 DIAGNOSIS — Z1231 Encounter for screening mammogram for malignant neoplasm of breast: Secondary | ICD-10-CM | POA: Insufficient documentation

## 2017-08-10 ENCOUNTER — Other Ambulatory Visit: Payer: Self-pay | Admitting: Internal Medicine

## 2017-08-31 DIAGNOSIS — G4733 Obstructive sleep apnea (adult) (pediatric): Secondary | ICD-10-CM | POA: Diagnosis not present

## 2017-09-07 ENCOUNTER — Other Ambulatory Visit: Payer: Self-pay | Admitting: Internal Medicine

## 2017-09-07 NOTE — Telephone Encounter (Signed)
Please advise on refill below.

## 2017-09-24 DIAGNOSIS — H02826 Cysts of left eye, unspecified eyelid: Secondary | ICD-10-CM | POA: Diagnosis not present

## 2017-09-28 ENCOUNTER — Other Ambulatory Visit: Payer: Self-pay | Admitting: Internal Medicine

## 2017-10-23 DIAGNOSIS — G4700 Insomnia, unspecified: Secondary | ICD-10-CM | POA: Diagnosis not present

## 2017-10-23 DIAGNOSIS — F79 Unspecified intellectual disabilities: Secondary | ICD-10-CM | POA: Diagnosis not present

## 2017-10-23 DIAGNOSIS — F39 Unspecified mood [affective] disorder: Secondary | ICD-10-CM | POA: Diagnosis not present

## 2017-10-23 DIAGNOSIS — Z79899 Other long term (current) drug therapy: Secondary | ICD-10-CM | POA: Diagnosis not present

## 2017-10-29 ENCOUNTER — Telehealth: Payer: Self-pay | Admitting: Nurse Practitioner

## 2017-10-29 DIAGNOSIS — G7000 Myasthenia gravis without (acute) exacerbation: Secondary | ICD-10-CM

## 2017-10-29 DIAGNOSIS — G40309 Generalized idiopathic epilepsy and epileptic syndromes, not intractable, without status epilepticus: Secondary | ICD-10-CM

## 2017-10-29 DIAGNOSIS — F73 Profound intellectual disabilities: Secondary | ICD-10-CM

## 2017-10-29 DIAGNOSIS — G4733 Obstructive sleep apnea (adult) (pediatric): Secondary | ICD-10-CM

## 2017-10-29 MED ORDER — PYRIDOSTIGMINE BROMIDE 60 MG PO TABS: ORAL_TABLET | ORAL | 6 refills | Status: DC

## 2017-10-29 NOTE — Telephone Encounter (Signed)
Pt request refill for pyridostigmine (MESTINON) 60 MG tablet sent to [facility]. Pt will be out tonight. She said the pharmacy has been faxing the request since Monday.

## 2017-11-11 ENCOUNTER — Ambulatory Visit (INDEPENDENT_AMBULATORY_CARE_PROVIDER_SITE_OTHER): Payer: PPO | Admitting: Internal Medicine

## 2017-11-11 ENCOUNTER — Encounter: Payer: Self-pay | Admitting: Internal Medicine

## 2017-11-11 VITALS — BP 114/78 | HR 100 | Temp 98.4°F | Resp 18 | Ht 63.0 in | Wt 221.0 lb

## 2017-11-11 DIAGNOSIS — G7000 Myasthenia gravis without (acute) exacerbation: Secondary | ICD-10-CM

## 2017-11-11 DIAGNOSIS — C439 Malignant melanoma of skin, unspecified: Secondary | ICD-10-CM

## 2017-11-11 DIAGNOSIS — E669 Obesity, unspecified: Secondary | ICD-10-CM | POA: Diagnosis not present

## 2017-11-11 DIAGNOSIS — Z0000 Encounter for general adult medical examination without abnormal findings: Secondary | ICD-10-CM

## 2017-11-11 DIAGNOSIS — F39 Unspecified mood [affective] disorder: Secondary | ICD-10-CM

## 2017-11-11 DIAGNOSIS — I10 Essential (primary) hypertension: Secondary | ICD-10-CM

## 2017-11-11 DIAGNOSIS — E7800 Pure hypercholesterolemia, unspecified: Secondary | ICD-10-CM | POA: Diagnosis not present

## 2017-11-11 DIAGNOSIS — R739 Hyperglycemia, unspecified: Secondary | ICD-10-CM | POA: Diagnosis not present

## 2017-11-11 DIAGNOSIS — R109 Unspecified abdominal pain: Secondary | ICD-10-CM | POA: Diagnosis not present

## 2017-11-11 NOTE — Progress Notes (Signed)
Patient ID: Madison Burns, female   DOB: [birth year], 67 y.o.   MRN: [id number]   Subjective:    Patient ID: Madison Burns, female    DOB: 30-Oct-1950, 67 y.o.   MRN: [id number]  HPI  Patient here for her physical exam.   She is accompanied by her sister.  History obtained from both of them.  She recently saw GI for persistent epigastric pain.  S/p EGD and colonoscopy.  Colonoscopy with non bleeding internal hemorrhoids and diverticulosis.  EGD - erythematous mucosa in antrum with irregular z-line.  On prilosec/carafate.  Still with epigastric discomfort.  Discussed f/u with GI.  She is eating.  No vomiting.  No chest pain.  No sob.  No bowel change.  Seeing psychiatry.  Stable.     Past Medical History:  Diagnosis Date  . GERD (gastroesophageal reflux disease)   . History of seizure disorder   . Hypercholesterolemia   . Hypertension   . Irritable bowel syndrome   . Myasthenia gravis ([place])   . OSA on CPAP    Past Surgical History:  Procedure Laterality Date  . ABDOMINAL HYSTERECTOMY    . APPENDECTOMY  1999  . CHOLECYSTECTOMY  2013  . COLONOSCOPY WITH PROPOFOL N/A 06/30/2017   Procedure: COLONOSCOPY WITH PROPOFOL;  Surgeon: Toledo, Benay Pike, MD;  Location: [facility] ENDOSCOPY;  Service: Gastroenterology;  Laterality: N/A;  . ESOPHAGOGASTRODUODENOSCOPY (EGD) WITH PROPOFOL N/A 06/30/2017   Procedure: ESOPHAGOGASTRODUODENOSCOPY (EGD) WITH PROPOFOL;  Surgeon: Toledo, Benay Pike, MD;  Location: [facility] ENDOSCOPY;  Service: Gastroenterology;  Laterality: N/A;  . TONSILLECTOMY     Family History  Problem Relation Age of Onset  . CVA Maternal Grandfather   . Colon cancer Maternal Grandfather        stomach/colon  . Heart disease Father        myocardial infarction  . Hyperlipidemia Father   . Hyperlipidemia Sister   . Bone cancer Unknown        cousin  . Alzheimer's disease Mother        maternal aunts  . Myasthenia gravis Brother        ocular  . Breast cancer Neg Hx    Social History     Socioeconomic History  . Marital status: Single    Spouse name: Not on file  . Number of children: 0  . Years of education: Special Ed  . Highest education level: Not on file  Occupational History  . Occupation: Unemployed  Social Needs  . Financial resource strain: Not on file  . Food insecurity:    Worry: Not on file    Inability: Not on file  . Transportation needs:    Medical: Not on file    Non-medical: Not on file  Tobacco Use  . Smoking status: Never Smoker  . Smokeless tobacco: Never Used  Substance and Sexual Activity  . Alcohol use: No    Alcohol/week: 0.0 standard drinks  . Drug use: No  . Sexual activity: Not on file  Lifestyle  . Physical activity:    Days per week: Not on file    Minutes per session: Not on file  . Stress: Not on file  Relationships  . Social connections:    Talks on phone: Not on file    Gets together: Not on file    Attends religious service: Not on file    Active member of club or organization: Not on file    Attends meetings of clubs or organizations: Not on  file    Relationship status: Not on file  Other Topics Concern  . Not on file  Social History Narrative   06/23/17 lives with sister, [name]   Regular exercise-no   Caffeine Use-yes    Outpatient Encounter Medications as of 11/11/2017  Medication Sig  . benzonatate (TESSALON) 100 MG capsule TAKE 1 CAPSULE BY MOUTH 3 TIMES DAILY  . levothyroxine (SYNTHROID, LEVOTHROID) 75 MCG tablet TAKE 1 TABLET BY MOUTH ONCE DAILY ON AN EMPTY STOMACH. WAIT 30 MINUTES BEFORE TAKING OTHER MEDS.  . methotrexate (RHEUMATREX) 2.5 MG tablet Take 6 tablets (15 mg total) by mouth once a week. Caution: Protect from light.  . mupirocin ointment (BACTROBAN) 2 % Place 1 application into the nose 2 (two) times daily.  [name] omeprazole (PRILOSEC) 20 MG capsule TAKE 1 CAPSULE BY MOUTH ONCE DAILY  . pyridostigmine (MESTINON) 60 MG tablet TAKE 1 TABLET BY MOUTH AT  8 AM, ONE AT LUNCH AND ONE AT 8PM  .  risperiDONE (RISPERDAL) 1 MG tablet Take 1 mg by mouth at bedtime.  . sucralfate (CARAFATE) 1 g tablet Take 1 g by mouth 2 (two) times daily.  [name] tolterodine (DETROL Burns) 4 MG 24 hr capsule TAKE 1 CAPSULE BY MOUTH ONCE EVERY MORNING   No facility-administered encounter medications on file as of 11/11/2017.     Review of Systems  Constitutional: Negative for appetite change and unexpected weight change.  HENT: Negative for congestion and sinus pressure.   Eyes: Negative for pain and visual disturbance.  Respiratory: Negative for cough, chest tightness and shortness of breath.   Cardiovascular: Negative for chest pain, palpitations and leg swelling.  Gastrointestinal: Negative for diarrhea, nausea and vomiting.       Epigastric abdominal discomfort as outlined.    Genitourinary: Negative for difficulty urinating and dysuria.  Musculoskeletal: Negative for joint swelling and myalgias.  Skin: Negative for color change and rash.  Neurological: Negative for dizziness, light-headedness and headaches.  Hematological: Negative for adenopathy. Does not bruise/bleed easily.  Psychiatric/Behavioral: Negative for agitation and dysphoric mood.       Objective:     Blood pressure rechecked by me:  114/78  Physical Exam  Constitutional: She is oriented to person, place, and time. She appears well-developed and well-nourished. No distress.  HENT:  Nose: Nose normal.  Mouth/Throat: Oropharynx is clear and moist.  Eyes: Right eye exhibits no discharge. Left eye exhibits no discharge. No scleral icterus.  Neck: Neck supple. No thyromegaly present.  Cardiovascular: Normal rate and regular rhythm.  Pulmonary/Chest: Breath sounds normal. No accessory muscle usage. No tachypnea. No respiratory distress. She has no decreased breath sounds. She has no wheezes. She has no rhonchi. Right breast exhibits no inverted nipple, no mass, no nipple discharge and no tenderness (no axillary adenopathy). Left breast  exhibits no inverted nipple, no mass, no nipple discharge and no tenderness (no axilarry adenopathy).  Abdominal: Soft. Bowel sounds are normal. There is no tenderness.  Musculoskeletal: She exhibits no edema or tenderness.  Lymphadenopathy:    She has no cervical adenopathy.  Neurological: She is alert and oriented to person, place, and time.  Skin: No rash noted. No erythema.  Psychiatric: She has a normal mood and affect. Her behavior is normal.    BP 114/78   Pulse 100   Temp 98.4 F (36.9 C) (Oral)   Resp 18   Ht _0  (1.6 m)   Wt 221 lb (100.2 kg)   SpO2 96%   BMI 39.15 kg/m  Wt Readings from Last 3 Encounters:  11/11/17 221 lb (100.2 kg)  06/30/17 220 lb (99.8 kg)  06/25/17 217 lb (98.4 kg)     Lab Results  Component Value Date   WBC 10.4 02/04/2017   HGB 14.1 02/04/2017   HCT 43.3 02/04/2017   PLT 264.0 02/04/2017   GLUCOSE 111 (H) 07/14/2017   CHOL 208 (H) 07/14/2017   TRIG 197.0 (H) 07/14/2017   HDL 38.90 (L) 07/14/2017   LDLDIRECT 122.0 02/13/2016   LDLCALC 130 (H) 07/14/2017   ALT 21 07/14/2017   AST 25 07/14/2017   NA 141 07/14/2017   K 3.9 07/14/2017   CL 105 07/14/2017   CREATININE 0.88 07/14/2017   BUN 10 07/14/2017   CO2 30 07/14/2017   TSH 2.49 07/14/2017   HGBA1C 5.5 07/14/2017    Mm Digital Screening  Result Date: 07/15/2017 CLINICAL DATA:  Screening. EXAM: DIGITAL SCREENING BILATERAL MAMMOGRAM WITH CAD COMPARISON:  Previous exam(s). ACR Breast Density Category b: There are scattered areas of fibroglandular density. FINDINGS: There are no findings suspicious for malignancy. Images were processed with CAD. IMPRESSION: No mammographic evidence of malignancy. A result letter of this screening mammogram will be mailed directly to the patient. RECOMMENDATION: Screening mammogram in one year. (Code:SM-B-01Y) BI-RADS CATEGORY  1: Negative. Electronically Signed   By: Lillia Mountain M.D.   On: 07/15/2017 11:22       Assessment & Plan:   Problem  List Items Addressed This Visit    Abdominal pain    Persistent epigastric pain as outlined.  Saw GI.  Had EGD and colonoscopy as outlined.  On prilosec.  Persistent.  Schedule f/u with GI.        Health care maintenance    Physical today 11/11/17.  Mammogram 07/15/17 - Birads I.  Colonoscopy 06/30/17 - diverticulosis and non bleeding internal hemorrhoids.        Hypercholesterolemia    Low cholesterol diet and exercise.  Follow lipid panel.  pts sister declines cholesterol medication.        Relevant Orders   Hepatic function panel   Lipid panel   Hyperglycemia    Low carb diet and exercise.  Follow met b and a1c.        Relevant Orders   Hemoglobin A1c   Hypertension    Blood pressure on recheck improved.  Follow pressures.        Relevant Orders   CBC with Differential/Platelet   Basic metabolic panel   Melanoma ([place])    Followed by dermatology.       Mood disorder [facility])    Seeing psychiatry.        Myasthenia gravis ([place])    Followed by neurology.  Stable.        Obesity (BMI 30-39.9)    Discussed diet and exercise.         Other Visit Diagnoses    Routine general medical examination at a health care facility    -  Primary       Einar Pheasant, MD

## 2017-11-14 ENCOUNTER — Encounter: Payer: Self-pay | Admitting: Internal Medicine

## 2017-11-14 NOTE — Assessment & Plan Note (Signed)
Followed by neurology.  Stable  

## 2017-11-14 NOTE — Assessment & Plan Note (Signed)
Persistent epigastric pain as outlined.  Saw GI.  Had EGD and colonoscopy as outlined.  On prilosec.  Persistent.  Schedule f/u with GI.

## 2017-11-14 NOTE — Assessment & Plan Note (Signed)
Discussed diet and exercise 

## 2017-11-14 NOTE — Assessment & Plan Note (Signed)
Followed by dermatology

## 2017-11-14 NOTE — Assessment & Plan Note (Signed)
Physical today 11/11/17.  Mammogram 07/15/17 - Birads I.  Colonoscopy 06/30/17 - diverticulosis and non bleeding internal hemorrhoids.

## 2017-11-14 NOTE — Assessment & Plan Note (Signed)
Low cholesterol diet and exercise.  Follow lipid panel.  pts sister declines cholesterol medication.

## 2017-11-14 NOTE — Assessment & Plan Note (Signed)
Low carb diet and exercise.  Follow met b and a1c.   

## 2017-11-14 NOTE — Assessment & Plan Note (Signed)
Seeing psychiatry

## 2017-11-14 NOTE — Assessment & Plan Note (Signed)
Blood pressure on recheck improved.  Follow pressures.

## 2017-11-16 ENCOUNTER — Telehealth: Payer: Self-pay

## 2017-11-16 NOTE — Telephone Encounter (Signed)
Patient is coming in for fasting labs on 10/4. I will fwd her labs to Dr. Nicolasa Ducking once they have been resulted. Patients sister is aware.

## 2017-11-16 NOTE — Telephone Encounter (Signed)
Copied from [place] [id number]. Topic: Inquiry >> Nov 16, 2017  2:10 PM Berneta Levins wrote: Reason for CRM:  Pt's sister, [name] calling in: Pt sees Dr. Nicolasa Ducking, psychologist - was referred by Dr. Nicki Reaper, and occassionally needs lab work done.  Pt received a letter in the mail that it is time to have lab work done again and they want to know if this can be done at lab [facility] coming up. Letter states pt needs to have:  hemoglobin A1C (254270), lipid panel ([id number]) done. [name] can be reached at [phone]

## 2017-11-20 ENCOUNTER — Ambulatory Visit (INDEPENDENT_AMBULATORY_CARE_PROVIDER_SITE_OTHER): Payer: PPO

## 2017-11-20 ENCOUNTER — Other Ambulatory Visit (INDEPENDENT_AMBULATORY_CARE_PROVIDER_SITE_OTHER): Payer: PPO

## 2017-11-20 DIAGNOSIS — R739 Hyperglycemia, unspecified: Secondary | ICD-10-CM

## 2017-11-20 DIAGNOSIS — E7800 Pure hypercholesterolemia, unspecified: Secondary | ICD-10-CM

## 2017-11-20 DIAGNOSIS — I10 Essential (primary) hypertension: Secondary | ICD-10-CM | POA: Diagnosis not present

## 2017-11-20 DIAGNOSIS — Z23 Encounter for immunization: Secondary | ICD-10-CM

## 2017-11-20 LAB — BASIC METABOLIC PANEL
BUN: 9 mg/dL (ref 6–23)
CHLORIDE: 105 meq/L (ref 96–112)
CO2: 28 meq/L (ref 19–32)
Calcium: 9.3 mg/dL (ref 8.4–10.5)
Creatinine, Ser: 0.94 mg/dL (ref 0.40–1.20)
GFR: 63.23 mL/min (ref >60.00)
GLUCOSE: 93 mg/dL (ref 70–99)
Potassium: 3.6 mEq/L (ref 3.5–5.1)
SODIUM: 140 meq/L (ref 135–145)

## 2017-11-20 LAB — CBC WITH DIFFERENTIAL/PLATELET
BASOS ABS: 0.1 10*3/uL (ref 0.0–0.1)
Basophils Relative: 0.8 % (ref 0.0–3.0)
EOS ABS: 0.2 10*3/uL (ref 0.0–0.7)
Eosinophils Relative: 2.3 % (ref 0.0–5.0)
HCT: 42.9 % (ref 36.0–46.0)
HEMOGLOBIN: 14.5 g/dL (ref 12.0–15.0)
LYMPHS ABS: 2.2 10*3/uL (ref 0.7–4.0)
Lymphocytes Relative: 23.4 % (ref 12.0–46.0)
MCHC: 33.9 g/dL (ref 30.0–36.0)
MCV: 98.7 fl (ref 78.0–100.0)
MONO ABS: 0.7 10*3/uL (ref 0.1–1.0)
Monocytes Relative: 7 % (ref 3.0–12.0)
NEUTROS PCT: 66.5 % (ref 43.0–77.0)
Neutro Abs: 6.4 10*3/uL (ref 1.4–7.7)
Platelets: 215 10*3/uL (ref 150.0–400.0)
RBC: 4.34 Mil/uL (ref 3.87–5.11)
RDW: 16 % — ABNORMAL HIGH (ref 11.5–15.5)
WBC: 9.6 10*3/uL (ref 4.0–10.5)

## 2017-11-20 LAB — LIPID PANEL
CHOL/HDL RATIO: 5
Cholesterol: 175 mg/dL (ref 0–200)
HDL: 33.4 mg/dL — AB (ref >39.00)
NONHDL: 141.68
TRIGLYCERIDES: 249 mg/dL — AB (ref 0.0–149.0)
VLDL: 49.8 mg/dL — AB (ref 0.0–40.0)

## 2017-11-20 LAB — HEPATIC FUNCTION PANEL
ALT: 17 U/L (ref 0–35)
AST: 20 U/L (ref 0–37)
Albumin: 3.8 g/dL (ref 3.5–5.2)
Alkaline Phosphatase: 62 U/L (ref 39–117)
BILIRUBIN DIRECT: 0.1 mg/dL (ref 0.0–0.3)
BILIRUBIN TOTAL: 0.5 mg/dL (ref 0.2–1.2)
Total Protein: 6.5 g/dL (ref 6.0–8.3)

## 2017-11-20 LAB — HEMOGLOBIN A1C: HEMOGLOBIN A1C: 5.5 % (ref 4.6–6.5)

## 2017-11-20 LAB — LDL CHOLESTEROL, DIRECT: Direct LDL: 103 mg/dL

## 2017-11-24 DIAGNOSIS — R1013 Epigastric pain: Secondary | ICD-10-CM | POA: Diagnosis not present

## 2017-11-24 DIAGNOSIS — R6881 Early satiety: Secondary | ICD-10-CM | POA: Diagnosis not present

## 2017-11-30 ENCOUNTER — Other Ambulatory Visit: Payer: Self-pay | Admitting: Gastroenterology

## 2017-11-30 DIAGNOSIS — R6881 Early satiety: Secondary | ICD-10-CM

## 2017-11-30 DIAGNOSIS — R1013 Epigastric pain: Secondary | ICD-10-CM

## 2017-12-18 ENCOUNTER — Ambulatory Visit
Admission: RE | Admit: 2017-12-18 | Discharge: 2017-12-18 | Disposition: A | Discharge status: Home / Self Care | Payer: PPO | Source: Ambulatory Visit | Attending: Gastroenterology | Admitting: Gastroenterology

## 2017-12-18 DIAGNOSIS — R197 Diarrhea, unspecified: Secondary | ICD-10-CM | POA: Diagnosis not present

## 2017-12-18 DIAGNOSIS — R1013 Epigastric pain: Secondary | ICD-10-CM | POA: Insufficient documentation

## 2017-12-18 DIAGNOSIS — R6881 Early satiety: Secondary | ICD-10-CM | POA: Diagnosis not present

## 2017-12-18 MED ORDER — TECHNETIUM TC 99M SULFUR COLLOID
2.0000 | Freq: Once | INTRAVENOUS | Status: AC | PRN
  Administered 2017-12-18: 2.29 via INTRAVENOUS

## 2017-12-30 ENCOUNTER — Other Ambulatory Visit: Payer: Self-pay | Admitting: Internal Medicine

## 2018-01-03 ENCOUNTER — Encounter: Payer: Self-pay | Admitting: Neurology

## 2018-01-04 NOTE — Progress Notes (Signed)
[facility]  PATIENT: Madison Burns DOB: [birth year]   REASON FOR VISIT: Follow-up for obstructive sleep apnea, myasthenia gravis and a history of seizure disorder HISTORY FROM: Patient    HISTORY OF PRESENT ILLNESS:HISTORY CDRebecca H Burns a 67 y.o.femaleseen here as a referral from Dr. Essie Hart Myasthenia [name], per special request of the patient.  Madison Burns carries a diagnosis of myasthenia gravis associated with difficulties swallowing, with a decreased level of energy a high degree of fatigue sometimes best interest in activities facial droop eyelid droop sleepiness snoring easy bruising, coughing, trouble swallowing she has a mild hoarseness but not as significant dysphonia and incontinence.  Her family and the patient looking for a maintenance Neurologist to keep her myasthenia under control. The patient is sleeping 12 hours easily per day, from 10 Pm to 8 AM. She has a droopy right eye.  She missed frequently the 3rd. Mestinon.  She has been followed by Dr. Melrose Nakayama at the [facility] clinic after following Dr. Paulla Dolly for years . The patient was diagnosed in 2011 with myasthenia gravis. She was hospitalized with myasthnic crisis Jan 27 2010- through [facility] 14th 2012. She was send to rehabilitation after that. Her sister noticed a decline in cognitive skills at that time.  Again, her first treatment had been prednisone and this was just weaned off by September 2015. The patient carries a diagnosis of hypercholesterolemia. She also has a past surgical history of appendectomy in 1999, hysterectomy in 2004 and the melanoma was removed in 2014 from the right side of the neck close to the TM joint.  Madison Burns is single without children, Lives with her sister now used to live with her mother. She is MRDD, she left school in 7th grade, went to a special facility until her GED, [employer]. She used to work at a [employer] ,later in a vocational trade.   Ms.  Burns is taking methotrexate to control them myasthenia she is also on Mestinon 60 mg 3 times a day she takes Detrol for urinary continence at 4 mg in the morning and she takes Risperdal 2 mg I mouth at bedtime. She is also treated for hypothyroidism with levothyroxine at 50 g daily. The methotrexate is given at 8 tablets once a week. Until last September 2015 the patient had been on prednisone. She has not had labs drawn for the last 5 month, while continuing on MTX.  4/19/17Today's visit is solely dedicated to the sleep disorder part of her neurological care Madison Burns underwent a sleep apnea test as a home sleep test, the AHI was 20.7 the lowest desaturation was 61 with 309 minutes of desaturation. This of course a possibility that in a home sleep test the hypoxemia is overestimated or artifact related but she does have a significant amount of obstructive sleep apnea as well. For this reason and because of her underlying condition of myasthenia gravis the patient was asked to undergo an attended CPAP titration. This took place on 04-19-14 the AHI was 0.3 on CPAP of only 5 cm water. Her SPO2 nadir rose to 91% no added oxygen was needed. I prescribed a ResMed air-fluid P 10 in medium size and an auto CPAP from 4 through 8 cm water. The patient states that she has not received CPAP machine that she had an outstanding bill was advanced home care which may be the reason.  She is on Medicare/ Medicaid and a sleep study cannot be older than 6 months to allow her  to obtain a machine - so I will have to repeat her sleep study. Her Epworth sleepiness score remains high at 17 points, her depression score at 3.5, fatigue severity questionnaire at 38 points. MRDD - needs assistence with CPAP, certainly would need a desensitization session with [facility] in [place].   Interval history from 06/06/2015 I had the pleasure of seeing Madison Burns today and in time after her last sleep study. She underwent a home sleep  test on February 1 which confirmed that she has apnea at an AHI of 20.7 and oxygen nadir of 61% and a total desaturation time of 309 minutes. The study was followed by an in lab CPAP titration but she reached an AHI of 0.2. 5 cm water appeared to be effective. She is using a ResMed air fit P 10 in very small nasal pillows. The patient also endorsed the Epworth sleepiness score at 12 points now from 17 prior to CPAP use. She had is still in the habit of going to take daytime naps but she feels not as if she can't resist the urge to nap. I have also the first compliance report available today she is 100% compliant has used it every day of the last 30 days and every day over 4 hours. Her average user time is 10 hours 8 minutes, her AHI is 1.1. She is struggling a little bit with allergic rhinitis, but has been able to still comply with CPAP use.  UPDATE 10/19/2017CM Ms. Burns, 67 year old female returns for follow-up with her mother. She has a history of obstructive sleep apnea on CPAP compliance report today is 100% for 30 days average usage 10 hours 6 minutes AHI is 1.1. No leaks. In addition she has myasthenia gravis and is currently on Mestinon and methotrexate. She needs refills. No recent falls. She has no facial weakness no trouble swallowing. She also has a history of seizure disorder but has not had seizures in many years and has been weaned off of her seizure medication. She returns for reevaluation UPDATE 06/28/2018CM Madison Burns, 67 year old female returns for follow-up with her sister. Her mother recently died in Jun 18, 2022. She has a history of obstructive sleep apnea on CPAP she also has a history of myasthenia gravis and is currently on Mestinon and methotrexate. She had a history of seizure disorder however she has been weaned off of her seizure medications without recurrent seizure activity. Her compliance report today from 07/15/2016 to  08/13/2016 greater than 4 hours for 29 days for 97%. Average  usage 9 hours 51 minutes 5 cm of pressure. The EPR level III3 AHI 1.8. Repeat BMP and hepatic function from 06/17/2016. She returns for reevaluation Madison Burns is a 67 y.o. female seen here as a referral from Dr. Nicki Reaper for Myasthenia gravis, per special request of the patient.  She has OSA and is suing CPAP compliantly. She has been stabile in her weight.   02/23/17 CD I have the pleasure of following Madison Burns for her obstructive sleep apnea, and she has again proven to be a highly compliant CPAP user.  Her CPAP compliance is 93% with 9 hours and 9 minutes on average daily use.  CPAP is set at only 5 cm water pressure and allows for reduction of the AHI to 0.6.  No central apneas are emerging and she has very few air leaks. Her baseline AHI in 02-2015 was 20.8/hr.  "She reports dreaming more- good dreams". She sleeps well. She has trouble with swallowing solid foods.  In addition she has not had significant muscle weakness, no falls, but she does feel easily fatigued and sleepy.  She endorsed the Epworth score today on 11 and the fatigue severity at 54 points. 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 She often cried during this task.  UPDATE 5/7/2019CM Madison Burns, 67 year old female returns for follow-up history of obstructive sleep apnea on CPAP.  She also has a history of myasthenia gravis and her methotrexate was reduced from 8 tablets to 6 at her  last visit in January with Dr. Brett Fairy.  She remains on Mestinon.  She has not had any associated symptoms with the decrease in medication.  CPAP compliance dated 05/23/2017-06/21/2017 shows compliance greater than 4 hours at 100%.  Average usage 10 hours 15 minutes.  Set pressure 5 cm.  EPR level 3 AHI 0.4 leaks 95th percentile at 5.2.  She is tolerating her CPAP well.  She is sleeping well she returns for reevaluation. UPDATE 11/19/2019CM Madison Burns, 67 year old female returns for follow-up with history of obstructive  sleep apnea on CPAP.  She also has a history of myasthenia gravis and seizure disorder.  She is no longer on seizure medication and no seizures in many years.  CPAP compliance dated 12/05/2017-01/03/2018 shows compliance greater than 4 hours at 100%.  Set pressure 5 cm.  Average usage 10 hours 29 minutes.  No leak.  AHI 1.3.  She remains on Mestinon and methotrexate for her myasthenia gravis which has been stable.  She complains of intermittent ptosis of the right eye when fatigued.  This is not evident today.  She has no double vision.  Appetite is good and she sleeps well she returns for reevaluation REVIEW OF SYSTEMS: Full 14 system review of systems performed and notable only for those listed, all others are neg:  Constitutional: neg  Cardiovascular: neg Ear/Nose/Throat: neg  Skin: neg Eyes: neg Respiratory: neg Gastroitestinal: Frequency of urination Hematology/Lymphatic: neg  Endocrine: neg Musculoskeletal:neg Allergy/Immunology: neg Neurological: Myasthenia gravis Psychiatric: neg Sleep : Obstructive sleep apnea with CPAP   ALLERGIES: Allergies  Allergen Reactions  . Ciprofloxacin Other (See Comments)    Sister denies  . Levofloxacin Other (See Comments)    Sister denies    HOME MEDICATIONS: Outpatient Medications Prior to Visit  Medication Sig Dispense Refill  . benzonatate (TESSALON) 100 MG capsule TAKE 1 CAPSULE BY MOUTH 3 TIMES DAILY 90 capsule 3  . levothyroxine (SYNTHROID, LEVOTHROID) 75 MCG tablet TAKE 1 TABLET BY MOUTH ONCE DAILY ON AN EMPTY STOMACH. WAIT 30 MINUTES BEFORE TAKING OTHER MEDS. 90 tablet 1  . methotrexate (RHEUMATREX) 2.5 MG tablet Take 6 tablets (15 mg total) by mouth once a week. Caution: Protect from light. 32 tablet 6  . mupirocin ointment (BACTROBAN) 2 % Place 1 application into the nose 2 (two) times daily. 22 g 0  . pantoprazole (PROTONIX) 40 MG tablet 40 mg daily.    [name] pyridostigmine (MESTINON) 60 MG tablet TAKE 1 TABLET BY MOUTH AT  8 AM, ONE  AT LUNCH AND ONE AT 8PM 90 tablet 6  . risperiDONE (RISPERDAL) 1 MG tablet Take 1 mg by mouth at bedtime.    . sucralfate (CARAFATE) 1 g tablet Take 1 g by mouth 2 (two) times daily.    [name] tolterodine (DETROL LA) 4 MG 24 hr capsule TAKE 1 CAPSULE BY MOUTH ONCE EVERY MORNING 90 capsule 0  . omeprazole (PRILOSEC) 20 MG capsule TAKE 1 CAPSULE BY MOUTH  ONCE DAILY 90 capsule 1   No facility-administered medications prior to visit.     PAST MEDICAL HISTORY: Past Medical History:  Diagnosis Date  . GERD (gastroesophageal reflux disease)   . History of seizure disorder   . Hypercholesterolemia   . Hypertension   . Irritable bowel syndrome   . Myasthenia gravis ([place])   . OSA on CPAP     PAST SURGICAL HISTORY: Past Surgical History:  Procedure Laterality Date  . ABDOMINAL HYSTERECTOMY    . APPENDECTOMY  1999  . CHOLECYSTECTOMY  2013  . COLONOSCOPY WITH PROPOFOL N/A 06/30/2017   Procedure: COLONOSCOPY WITH PROPOFOL;  Surgeon: Toledo, Benay Pike, MD;  Location: [facility] ENDOSCOPY;  Service: Gastroenterology;  Laterality: N/A;  . ESOPHAGOGASTRODUODENOSCOPY (EGD) WITH PROPOFOL N/A 06/30/2017   Procedure: ESOPHAGOGASTRODUODENOSCOPY (EGD) WITH PROPOFOL;  Surgeon: Toledo, Benay Pike, MD;  Location: [facility] ENDOSCOPY;  Service: Gastroenterology;  Laterality: N/A;  . TONSILLECTOMY      FAMILY HISTORY: Family History  Problem Relation Age of Onset  . CVA Maternal Grandfather   . Colon cancer Maternal Grandfather        stomach/colon  . Heart disease Father        myocardial infarction  . Hyperlipidemia Father   . Hyperlipidemia Sister   . Bone cancer Unknown        cousin  . Alzheimer's disease Mother        maternal aunts  . Myasthenia gravis Brother        ocular  . Breast cancer Neg Hx     SOCIAL HISTORY: Social History   Socioeconomic History  . Marital status: Single    Spouse name: Not on file  . Number of children: 0  . Years of education: Special Ed  . Highest education level:  Not on file  Occupational History  . Occupation: Unemployed  Social Needs  . Financial resource strain: Not on file  . Food insecurity:    Worry: Not on file    Inability: Not on file  . Transportation needs:    Medical: Not on file    Non-medical: Not on file  Tobacco Use  . Smoking status: Never Smoker  . Smokeless tobacco: Never Used  Substance and Sexual Activity  . Alcohol use: No    Alcohol/week: 0.0 standard drinks  . Drug use: No  . Sexual activity: Not on file  Lifestyle  . Physical activity:    Days per week: Not on file    Minutes per session: Not on file  . Stress: Not on file  Relationships  . Social connections:    Talks on phone: Not on file    Gets together: Not on file    Attends religious service: Not on file    Active member of club or organization: Not on file    Attends meetings of clubs or organizations: Not on file    Relationship status: Not on file  . Intimate partner violence:    Fear of current or ex partner: Not on file    Emotionally abused: Not on file    Physically abused: Not on file    Forced sexual activity: Not on file  Other Topics Concern  . Not on file  Social History Narrative   06/23/17 lives with sister, [name]   Regular exercise-no   Caffeine Use-yes     PHYSICAL EXAM  Vitals:   01/05/18 1430  BP: 126/68  Pulse: 80  Weight: 219 lb 12.8 oz (99.7 kg)  Height:  5\' 3"  (1.6 m)   Body mass index is 38.94 kg/m.  Generalized: Well developed, morbidly obese in no acute distress  Head: normocephalic and atraumatic,. Oropharynx benign Mallopatti 3 Neck: Supple, circumference 18 inches Lungs clear Musculoskeletal: No deformity  Skin no rash or edema Neurological examination   Mentation: Alert oriented to time, place, history taking. Attention span and concentration appropriate. Recent and remote memory intact.  Follows all commands speech with dysphonia and sparse wordflow  Cranial nerve II-XII: Pupils were equal round  reactive to light extraocular movements were full, visual field were full on confrontational test.  No ptosis.  Facial sensation and strength were normal. hearing was intact to finger rubbing bilaterally. Uvula tongue midline. head turning and shoulder shrug were normal and symmetric.Tongue protrusion into cheek strength was normal. Motor: normal bulk and tone, full strength in the BUE, BLE, Sensory: normal and symmetric to light touch,  Coordination: finger-nose-finger,no dysmetria Reflexes: Symmetric upper and lower, plantar responses were flexor bilaterally. Gait and Station: Rising up from seated position without assistance, normal stance,  moderate stride, good arm swing, smooth turning, able to perform tiptoe, and heel walking without difficulty. Unable to tandem walk.  No assistive device  DIAGNOSTIC DATA (LABS, IMAGING, TESTING) - I reviewed patient records, labs, notes, testing and imaging myself where available.  Lab Results  Component Value Date   WBC 9.6 11/20/2017   HGB 14.5 11/20/2017   HCT 42.9 11/20/2017   MCV 98.7 11/20/2017   PLT 215.0 11/20/2017      Component Value Date/Time   NA 140 11/20/2017 0902   NA 144 04/04/2015 1600   NA 137 02/21/2012 1828   K 3.6 11/20/2017 0902   K 4.3 02/21/2012 1828   CL 105 11/20/2017 0902   CL 104 02/21/2012 1828   CO2 28 11/20/2017 0902   CO2 25 02/21/2012 1828   GLUCOSE 93 11/20/2017 0902   GLUCOSE 145 (H) 02/21/2012 1828   BUN 9 11/20/2017 0902   BUN 8 04/04/2015 1600   BUN 8 02/21/2012 1828   CREATININE 0.94 11/20/2017 0902   CREATININE 1.14 02/21/2012 1828   CALCIUM 9.3 11/20/2017 0902   CALCIUM 9.0 02/21/2012 1828   PROT 6.5 11/20/2017 0902   PROT 6.0 04/04/2015 1600   PROT 6.4 04/25/2011 0854   ALBUMIN 3.8 11/20/2017 0902   ALBUMIN 4.0 04/04/2015 1600   ALBUMIN 3.4 04/25/2011 0854   Burns 20 11/20/2017 0902   Burns 18 04/25/2011 0854   ALT 17 11/20/2017 0902   ALT 25 04/25/2011 0854   ALKPHOS 62 11/20/2017 0902    ALKPHOS 45 (L) 04/25/2011 0854   BILITOT 0.5 11/20/2017 0902   BILITOT 0.4 04/04/2015 1600   BILITOT 0.4 04/25/2011 0854   GFRNONAA 80 04/04/2015 1600   GFRNONAA 52 (L) 02/21/2012 1828   GFRAA 92 04/04/2015 1600   GFRAA >60 02/21/2012 1828   Lab Results  Component Value Date   CHOL 175 11/20/2017   HDL 33.40 (L) 11/20/2017   LDLCALC 130 (H) 07/14/2017   LDLDIRECT 103.0 11/20/2017   TRIG 249.0 (H) 11/20/2017   CHOLHDL 5 11/20/2017   Lab Results  Component Value Date   HGBA1C 5.5 11/20/2017    Lab Results  Component Value Date   TSH 2.49 07/14/2017      ASSESSMENT AND PLAN  67 y.o. year old female  has a past medical history of Myasthenia gravis ([place]). And obstructive sleep apnea. To follow-up for CPAP compliance.CPAP compliance dated 12/05/2017-01/03/2018 shows compliance greater than 4  hours at 100%.  Set pressure 5 cm.  Average usage 10 hours 29 minutes.  No leak.  AHI 1.3.  History of seizure disorder and no seizure activity in many years.  Myasthenia gravis currently well controlled on Mestinon and methotrexate    Plan: CPAP compliance excellent at 100% Continue same settings Continue Mestinon at current dose.  Continue methotrexate , no symptoms with reduction of  methotrexate for myasthenia gravis  to 6 pills or 15mg  weekly will reduce further at next visit we will refill Reviewed recent CBC with differential and BMP on 11/20/2017 within normal limits History of seizure disorder no seizures in many years and has been weaned from seizure medication Follow-up in 6 months, next with Dr. Brett Fairy I spent  25 min in total face to face time with the patient and sister more than 50% of which was spent counseling and coordination of care, reviewing test results reviewing medications and discussing and reviewing the diagnosis of myasthenia gravis and symptoms to report and obstructive sleep apnea. Reviewed  compliance report with them. Madison Bible, [facility], [facility],  APRN  [facility] [address], [place], [place] [address] [phone]

## 2018-01-05 ENCOUNTER — Ambulatory Visit (INDEPENDENT_AMBULATORY_CARE_PROVIDER_SITE_OTHER): Payer: PPO | Admitting: Nurse Practitioner

## 2018-01-05 ENCOUNTER — Encounter: Payer: Self-pay | Admitting: Nurse Practitioner

## 2018-01-05 VITALS — BP 126/68 | HR 80 | Ht 63.0 in | Wt 219.8 lb

## 2018-01-05 DIAGNOSIS — G4733 Obstructive sleep apnea (adult) (pediatric): Secondary | ICD-10-CM

## 2018-01-05 DIAGNOSIS — Z9989 Dependence on other enabling machines and devices: Secondary | ICD-10-CM

## 2018-01-05 DIAGNOSIS — G7000 Myasthenia gravis without (acute) exacerbation: Secondary | ICD-10-CM | POA: Diagnosis not present

## 2018-01-05 DIAGNOSIS — Z8669 Personal history of other diseases of the nervous system and sense organs: Secondary | ICD-10-CM | POA: Diagnosis not present

## 2018-01-05 MED ORDER — METHOTREXATE 2.5 MG PO TABS: 15.0000 mg | ORAL_TABLET | ORAL | 6 refills | Status: DC

## 2018-01-05 NOTE — Patient Instructions (Signed)
CPAP compliance excellent at 100% Continue same settings Continue Mestinon at current dose.  Continue methotrexate , no symptoms with reduction of  methotrexate for myasthenia gravis  to 6 pills or 15mg  weekly will reduce further at next visit we will refill History of seizure disorder no seizures in many years and has been weaned from seizure medication Follow-up in 6 months, next with Dr. Brett Fairy

## 2018-02-16 ENCOUNTER — Other Ambulatory Visit: Payer: Self-pay | Admitting: Internal Medicine

## 2018-02-22 DIAGNOSIS — F39 Unspecified mood [affective] disorder: Secondary | ICD-10-CM | POA: Diagnosis not present

## 2018-02-22 DIAGNOSIS — G4700 Insomnia, unspecified: Secondary | ICD-10-CM | POA: Diagnosis not present

## 2018-02-22 DIAGNOSIS — F79 Unspecified intellectual disabilities: Secondary | ICD-10-CM | POA: Diagnosis not present

## 2018-03-16 ENCOUNTER — Encounter: Payer: Self-pay | Admitting: Internal Medicine

## 2018-03-16 ENCOUNTER — Ambulatory Visit (INDEPENDENT_AMBULATORY_CARE_PROVIDER_SITE_OTHER): Payer: PPO | Admitting: Internal Medicine

## 2018-03-16 DIAGNOSIS — I10 Essential (primary) hypertension: Secondary | ICD-10-CM

## 2018-03-16 DIAGNOSIS — Z9989 Dependence on other enabling machines and devices: Secondary | ICD-10-CM | POA: Diagnosis not present

## 2018-03-16 DIAGNOSIS — C439 Malignant melanoma of skin, unspecified: Secondary | ICD-10-CM

## 2018-03-16 DIAGNOSIS — G4733 Obstructive sleep apnea (adult) (pediatric): Secondary | ICD-10-CM | POA: Diagnosis not present

## 2018-03-16 DIAGNOSIS — E669 Obesity, unspecified: Secondary | ICD-10-CM | POA: Diagnosis not present

## 2018-03-16 DIAGNOSIS — E7800 Pure hypercholesterolemia, unspecified: Secondary | ICD-10-CM | POA: Diagnosis not present

## 2018-03-16 DIAGNOSIS — F39 Unspecified mood [affective] disorder: Secondary | ICD-10-CM

## 2018-03-16 DIAGNOSIS — G7000 Myasthenia gravis without (acute) exacerbation: Secondary | ICD-10-CM | POA: Diagnosis not present

## 2018-03-16 DIAGNOSIS — R739 Hyperglycemia, unspecified: Secondary | ICD-10-CM

## 2018-03-16 DIAGNOSIS — G40909 Epilepsy, unspecified, not intractable, without status epilepticus: Secondary | ICD-10-CM | POA: Diagnosis not present

## 2018-03-16 DIAGNOSIS — R109 Unspecified abdominal pain: Secondary | ICD-10-CM | POA: Diagnosis not present

## 2018-03-16 NOTE — Progress Notes (Signed)
Patient ID: Madison Burns, female   DOB: 1950/06/02, 68 y.o.   MRN: [id number]   Subjective:    Patient ID: Madison Burns, female    DOB: [birth year], 68 y.o.   MRN: [id number]  HPI  Patient here for a scheduled follow up.  She is accompanied by her sister.  History obtained from both of them.  Sees neurology for history of myasthenia gravis.  Stable on mestinon and MTX.  Also using cpap.  Last evaluated 01/05/18.  Stable.  Recommended f/u in 6 months.   She was also seen by GI for epigastric pain.  Omeprazole was changed to protonix.  Gastric emptying study was normal.  On discussion with her and her sister, she is still having some intermittent epigastric discomfort.  No nausea or vomiting.  Eating.  States if she drinks fast, will get strangles at times.  No bowel change.     Past Medical History:  Diagnosis Date  . GERD (gastroesophageal reflux disease)   . History of seizure disorder   . Hypercholesterolemia   . Hypertension   . Irritable bowel syndrome   . Myasthenia gravis ([place])   . OSA on CPAP    Past Surgical History:  Procedure Laterality Date  . ABDOMINAL HYSTERECTOMY    . APPENDECTOMY  1999  . CHOLECYSTECTOMY  2013  . COLONOSCOPY WITH PROPOFOL N/A 06/30/2017   Procedure: COLONOSCOPY WITH PROPOFOL;  Surgeon: Toledo, Benay Pike, MD;  Location: [facility] END[facility]COPY;  Service: Gastroenterology;  Laterality: N/A;  . ESOPHAGOGASTRODUODENOSCOPY (EGD) WITH PROPOFOL N/A 06/30/2017   Procedure: ESOPHAGOGASTRODUODENOSCOPY (EGD) WITH PROPOFOL;  Surgeon: Toledo, Benay Pike, MD;  Location: [facility] END[facility]COPY;  Service: Gastroenterology;  Laterality: N/A;  . TONSILLECTOMY     Family History  Problem Relation Age of Onset  . CVA Maternal Grandfather   . Colon cancer Maternal Grandfather        stomach/colon  . Heart disease Father        myocardial infarction  . Hyperlipidemia Father   . Hyperlipidemia Sister   . Bone cancer Other        cousin  . Alzheimer's disease Mother        maternal  aunts  . Myasthenia gravis Brother        ocular  . Breast cancer Neg Hx    Social History   Socioeconomic History  . Marital status: Single    Spouse name: Not on file  . Number of children: 0  . Years of education: Special Ed  . Highest education level: Not on file  Occupational History  . Occupation: Unemployed  Social Needs  . Financial resource strain: Not on file  . Food insecurity:    Worry: Not on file    Inability: Not on file  . Transportation needs:    Medical: Not on file    Non-medical: Not on file  Tobacco Use  . Smoking status: Never Smoker  . Smokeless tobacco: Never Used  Substance and Sexual Activity  . Alcohol use: No    Alcohol/week: 0.0 standard drinks  . Drug use: No  . Sexual activity: Not on file  Lifestyle  . Physical activity:    Days per week: Not on file    Minutes per session: Not on file  . Stress: Not on file  Relationships  . Social connections:    Talks on phone: Not on file    Gets together: Not on file    Attends religious service: Not on file  Active member of club or organization: Not on file    Attends meetings of clubs or organizations: Not on file    Relationship status: Not on file  Other Topics Concern  . Not on file  Social History Narrative   06/23/17 lives with sister, [name]   Regular exercise-no   Caffeine Use-yes    Outpatient Encounter Medications as of 03/16/2018  Medication Sig  . benzonatate (TESSALON) 100 MG capsule TAKE 1 CAPSULE BY MOUTH 3 TIMES DAILY  . levothyroxine (SYNTHROID, LEVOTHROID) 75 MCG tablet TAKE 1 TABLET BY MOUTH ONCE DAILY ON AN EMPTY STOMACH. WAIT 30 MINUTES BEFORE TAKING OTHER MEDS.  . methotrexate (RHEUMATREX) 2.5 MG tablet Take 6 tablets (15 mg total) by mouth once a week. Caution: Protect from light.  . mupirocin ointment (BACTROBAN) 2 % Place 1 application into the nose 2 (two) times daily.  . pantoprazole (PROTONIX) 40 MG tablet 40 mg daily.  [name] pyridostigmine (MESTINON) 60 MG tablet  TAKE 1 TABLET BY MOUTH AT  8 AM, ONE AT LUNCH AND ONE AT 8PM  . risperiDONE (RISPERDAL) 1 MG tablet Take 1 mg by mouth at bedtime.  . sucralfate (CARAFATE) 1 g tablet Take 1 g by mouth 2 (two) times daily.  [name] tolterodine (DETROL Burns) 4 MG 24 hr capsule TAKE 1 CAPSULE BY MOUTH ONCE EVERY MORNING   No facility-administered encounter medications on file as of 03/16/2018.     Review of Systems  Constitutional: Negative for appetite change and unexpected weight change.  HENT: Negative for congestion and sinus pressure.   Respiratory: Negative for cough, chest tightness and shortness of breath.   Cardiovascular: Negative for chest pain, palpitations and leg swelling.  Gastrointestinal: Negative for diarrhea, nausea and vomiting.       Intermittent abdominal discomfort.    Genitourinary: Negative for difficulty urinating and dysuria.  Musculoskeletal: Negative for joint swelling and myalgias.  Skin: Negative for color change and rash.  Neurological: Negative for dizziness, light-headedness and headaches.  Psychiatric/Behavioral: Negative for agitation and dysphoric mood.       Objective:    Physical Exam Constitutional:      General: She is not in acute distress.    Appearance: Normal appearance.  HENT:     Nose: Nose normal. No congestion.     Mouth/Throat:     Pharynx: No oropharyngeal exudate or posterior oropharyngeal erythema.  Neck:     Musculoskeletal: Neck supple. No muscular tenderness.     Thyroid: No thyromegaly.  Cardiovascular:     Rate and Rhythm: Normal rate and regular rhythm.  Pulmonary:     Effort: No respiratory distress.     Breath sounds: Normal breath sounds. No wheezing.  Abdominal:     General: Bowel sounds are normal.     Palpations: Abdomen is soft.     Tenderness: There is no abdominal tenderness.  Musculoskeletal:        General: No swelling or tenderness.  Lymphadenopathy:     Cervical: No cervical adenopathy.  Skin:    Findings: No erythema or  rash.  Neurological:     Mental Status: She is alert.  Psychiatric:        Mood and Affect: Mood normal.        Behavior: Behavior normal.     BP 136/82 (BP Location: Left Arm, Patient Position: Sitting, Cuff Size: Large)   Pulse 83   Temp (!) 97.4 F (36.3 C)   Wt 221 lb (100.2 kg)   SpO2 98%  BMI 39.15 kg/m  Wt Readings from Last 3 Encounters:  03/16/18 221 lb (100.2 kg)  01/05/18 219 lb 12.8 oz (99.7 kg)  11/11/17 221 lb (100.2 kg)     Lab Results  Component Value Date   WBC 9.6 11/20/2017   HGB 14.5 11/20/2017   HCT 42.9 11/20/2017   PLT 215.0 11/20/2017   GLUCOSE 93 11/20/2017   CHOL 175 11/20/2017   TRIG 249.0 (H) 11/20/2017   HDL 33.40 (L) 11/20/2017   LDLDIRECT 103.0 11/20/2017   LDLCALC 130 (H) 07/14/2017   ALT 17 11/20/2017   AST 20 11/20/2017   NA 140 11/20/2017   K 3.6 11/20/2017   CL 105 11/20/2017   CREATININE 0.94 11/20/2017   BUN 9 11/20/2017   CO2 28 11/20/2017   TSH 2.49 07/14/2017   HGBA1C 5.5 11/20/2017    Nm Gastric Emptying  Result Date: 12/18/2017 CLINICAL DATA:  Early satiety and diarrhea EXAM: NUCLEAR MEDICINE GASTRIC EMPTYING SCAN TECHNIQUE: After oral ingestion of radiolabeled meal, sequential abdominal images were obtained for 4 hours. Percentage of activity emptying the stomach was calculated at 1 hour, 2 hour, 3 hour, and 4 hours. RADIOPHARMACEUTICALS:  2.29 mCi Tc-37msulfur colloid in standardized meal including egg COMPARISON:  None. FINDINGS: Expected location of the stomach in the left upper quadrant. Ingested meal empties the stomach gradually over the course of the study. 38% emptied at 1 hr ( normal >= 10%) 54% emptied at 2 hr ( normal >= 40%) 67% emptied at 3 hr ( normal >= 70%) 92% emptied at 4 hr ( normal >= 90%) IMPRESSION: Normal gastric emptying study. Electronically Signed   By: WLowella GripIII M.D.   On: 12/18/2017 13:55       Assessment & Plan:   Problem List Items Addressed This Visit    Abdominal pain     Persistent intermittent epigastric abdominal pain.  Saw GI. S/p EGD and colonoscopy previously.  Recent normal gastric emptying study.  Given persistent pain, will refer back to GI for further evaluation.        Hypercholesterolemia    Low cholesterol diet and exercise.  Sister declines cholesterol medication.        Relevant Orders   Hepatic function panel   Lipid panel   Hyperglycemia    Low carb diet and exercise.  Follow met b and a1c.        Relevant Orders   Hemoglobin A1c   Hypertension    Blood pressure under good control.  Continue same medication regimen.  Follow pressures.  Follow metabolic panel.        Relevant Orders   TSH   Basic metabolic panel   [place]    Followed by dermatology.       M[place] disorder ([place]    Has been evaluated by psychiatry.        [place]henia [place]    Followed by neurology.  Stable.        Obesity (BMI 30-39.9)    Discussed diet and exercise.  Follow.        OSA on CPAP    Uses cpap regularly.  Doing well.        Seizure disorder ([place]    No recent seizures.  Off medication.  Followed by neurology.            CEinar Pheasant MD

## 2018-03-21 ENCOUNTER — Encounter: Payer: Self-pay | Admitting: Internal Medicine

## 2018-03-21 NOTE — Assessment & Plan Note (Signed)
Blood pressure under good control.  Continue same medication regimen.  Follow pressures.  Follow metabolic panel.   

## 2018-03-21 NOTE — Assessment & Plan Note (Signed)
Uses cpap regularly.  Doing well.

## 2018-03-21 NOTE — Assessment & Plan Note (Signed)
Persistent intermittent epigastric abdominal pain.  Saw GI. S/p EGD and colonoscopy previously.  Recent normal gastric emptying study.  Given persistent pain, will refer back to GI for further evaluation.

## 2018-03-21 NOTE — Assessment & Plan Note (Signed)
Has been evaluated by psychiatry.

## 2018-03-21 NOTE — Assessment & Plan Note (Signed)
Discussed diet and exercise.  Follow.  

## 2018-03-21 NOTE — Assessment & Plan Note (Signed)
Followed by dermatology

## 2018-03-21 NOTE — Assessment & Plan Note (Signed)
Low carb diet and exercise.  Follow met b and a1c.   

## 2018-03-21 NOTE — Assessment & Plan Note (Signed)
Followed by neurology.  Stable  

## 2018-03-21 NOTE — Assessment & Plan Note (Signed)
No recent seizures.  Off medication.  Followed by neurology.

## 2018-03-21 NOTE — Assessment & Plan Note (Signed)
Low cholesterol diet and exercise.  Sister declines cholesterol medication.

## 2018-03-30 ENCOUNTER — Other Ambulatory Visit (INDEPENDENT_AMBULATORY_CARE_PROVIDER_SITE_OTHER): Payer: PPO

## 2018-03-30 DIAGNOSIS — E7800 Pure hypercholesterolemia, unspecified: Secondary | ICD-10-CM | POA: Diagnosis not present

## 2018-03-30 DIAGNOSIS — I10 Essential (primary) hypertension: Secondary | ICD-10-CM | POA: Diagnosis not present

## 2018-03-30 DIAGNOSIS — R739 Hyperglycemia, unspecified: Secondary | ICD-10-CM | POA: Diagnosis not present

## 2018-03-30 LAB — BASIC METABOLIC PANEL
BUN: 10 mg/dL (ref 6–23)
CALCIUM: 8.8 mg/dL (ref 8.4–10.5)
CHLORIDE: 103 meq/L (ref 96–112)
CO2: 30 meq/L (ref 19–32)
CREATININE: 0.88 mg/dL (ref 0.40–1.20)
GFR: 64.13 mL/min (ref >60.00)
GLUCOSE: 106 mg/dL — AB (ref 70–99)
Potassium: 3.7 mEq/L (ref 3.5–5.1)
SODIUM: 140 meq/L (ref 135–145)

## 2018-03-30 LAB — TSH: TSH: 2.07 u[IU]/mL (ref 0.35–4.50)

## 2018-03-30 LAB — HEPATIC FUNCTION PANEL
ALBUMIN: 3.9 g/dL (ref 3.5–5.2)
ALT: 23 U/L (ref 0–35)
AST: 28 U/L (ref 0–37)
Alkaline Phosphatase: 56 U/L (ref 39–117)
BILIRUBIN TOTAL: 0.8 mg/dL (ref 0.2–1.2)
Bilirubin, Direct: 0.2 mg/dL (ref 0.0–0.3)
Total Protein: 6.3 g/dL (ref 6.0–8.3)

## 2018-03-30 LAB — LIPID PANEL
CHOL/HDL RATIO: 5
Cholesterol: 193 mg/dL (ref 0–200)
HDL: 35.9 mg/dL — ABNORMAL LOW (ref >39.00)
LDL CALC: 120 mg/dL — AB (ref 0–99)
NonHDL: 157.57
TRIGLYCERIDES: 187 mg/dL — AB (ref 0.0–149.0)
VLDL: 37.4 mg/dL (ref 0.0–40.0)

## 2018-03-30 LAB — HEMOGLOBIN A1C: Hgb A1c MFr Bld: 5.4 % (ref 4.6–6.5)

## 2018-04-07 ENCOUNTER — Other Ambulatory Visit: Payer: Self-pay | Admitting: Internal Medicine

## 2018-04-29 ENCOUNTER — Other Ambulatory Visit: Payer: Self-pay | Admitting: Internal Medicine

## 2018-05-05 ENCOUNTER — Other Ambulatory Visit: Payer: Self-pay | Admitting: Internal Medicine

## 2018-05-18 DIAGNOSIS — I10 Essential (primary) hypertension: Secondary | ICD-10-CM | POA: Diagnosis not present

## 2018-05-18 DIAGNOSIS — G4733 Obstructive sleep apnea (adult) (pediatric): Secondary | ICD-10-CM | POA: Diagnosis not present

## 2018-05-18 DIAGNOSIS — E7800 Pure hypercholesterolemia, unspecified: Secondary | ICD-10-CM | POA: Diagnosis not present

## 2018-05-18 DIAGNOSIS — R451 Restlessness and agitation: Secondary | ICD-10-CM | POA: Diagnosis not present

## 2018-05-19 ENCOUNTER — Other Ambulatory Visit: Payer: Self-pay | Admitting: Internal Medicine

## 2018-05-20 ENCOUNTER — Telehealth: Payer: Self-pay

## 2018-05-20 NOTE — Telephone Encounter (Signed)
LM on VM for patient to call to schedule f/u visit before we can refill Tessalon for next month. She can set up either phone or virtual visit. Last seen 05/2017.

## 2018-06-03 DIAGNOSIS — F79 Unspecified intellectual disabilities: Secondary | ICD-10-CM | POA: Diagnosis not present

## 2018-06-03 DIAGNOSIS — F39 Unspecified mood [affective] disorder: Secondary | ICD-10-CM | POA: Diagnosis not present

## 2018-06-03 DIAGNOSIS — G4700 Insomnia, unspecified: Secondary | ICD-10-CM | POA: Diagnosis not present

## 2018-06-09 ENCOUNTER — Other Ambulatory Visit: Payer: Self-pay | Admitting: Internal Medicine

## 2018-06-13 ENCOUNTER — Other Ambulatory Visit: Payer: Self-pay | Admitting: Internal Medicine

## 2018-06-14 ENCOUNTER — Other Ambulatory Visit: Payer: Self-pay | Admitting: Neurology

## 2018-06-14 DIAGNOSIS — G4733 Obstructive sleep apnea (adult) (pediatric): Secondary | ICD-10-CM

## 2018-06-14 DIAGNOSIS — F73 Profound intellectual disabilities: Secondary | ICD-10-CM

## 2018-06-14 DIAGNOSIS — G40309 Generalized idiopathic epilepsy and epileptic syndromes, not intractable, without status epilepticus: Secondary | ICD-10-CM

## 2018-06-14 DIAGNOSIS — G7000 Myasthenia gravis without (acute) exacerbation: Secondary | ICD-10-CM

## 2018-06-14 MED ORDER — PYRIDOSTIGMINE BROMIDE 60 MG PO TABS: ORAL_TABLET | ORAL | 6 refills | Status: DC

## 2018-06-29 ENCOUNTER — Other Ambulatory Visit: Payer: Self-pay

## 2018-06-29 MED ORDER — METHOTREXATE 2.5 MG PO TABS: 15.0000 mg | ORAL_TABLET | ORAL | 3 refills | Status: DC

## 2018-07-01 ENCOUNTER — Telehealth: Payer: Self-pay | Admitting: Neurology

## 2018-07-01 NOTE — Telephone Encounter (Signed)
Called the patient and spoke with her sister and she has decided to cancel upcoming apt and will call back to reschedule.  Pt can schedule with Dr Brett Fairy or NP

## 2018-07-06 ENCOUNTER — Ambulatory Visit: Payer: PPO | Admitting: Neurology

## 2018-07-19 ENCOUNTER — Other Ambulatory Visit: Payer: Self-pay

## 2018-07-19 ENCOUNTER — Ambulatory Visit (INDEPENDENT_AMBULATORY_CARE_PROVIDER_SITE_OTHER): Payer: PPO | Admitting: Internal Medicine

## 2018-07-19 DIAGNOSIS — E7800 Pure hypercholesterolemia, unspecified: Secondary | ICD-10-CM | POA: Diagnosis not present

## 2018-07-19 DIAGNOSIS — C439 Malignant melanoma of skin, unspecified: Secondary | ICD-10-CM | POA: Diagnosis not present

## 2018-07-19 DIAGNOSIS — R739 Hyperglycemia, unspecified: Secondary | ICD-10-CM | POA: Diagnosis not present

## 2018-07-19 DIAGNOSIS — G4730 Sleep apnea, unspecified: Secondary | ICD-10-CM | POA: Diagnosis not present

## 2018-07-19 DIAGNOSIS — R109 Unspecified abdominal pain: Secondary | ICD-10-CM

## 2018-07-19 DIAGNOSIS — G4710 Hypersomnia, unspecified: Secondary | ICD-10-CM | POA: Diagnosis not present

## 2018-07-19 DIAGNOSIS — I10 Essential (primary) hypertension: Secondary | ICD-10-CM | POA: Diagnosis not present

## 2018-07-19 DIAGNOSIS — G7001 Myasthenia gravis with (acute) exacerbation: Secondary | ICD-10-CM | POA: Diagnosis not present

## 2018-07-19 NOTE — Progress Notes (Signed)
Virtual Visit via video Note  This visit type was conducted due to national recommendations for restrictions regarding the COVID-19 pandemic (e.g. social distancing).  This format is felt to be most appropriate for this patient at this time.  All issues noted in this document were discussed and addressed.  No physical exam was performed (except for noted visual exam findings with Video Visits).   I connected with Madison Burns by a video enabled telemedicine application and verified that I am speaking with the correct person using two identifiers. Location patient: home Location provider: work  Persons participating in the virtual visit: patient, provider and pt sister Madison Burns  I discussed the limitations, risks, security and privacy concerns of performing an evaluation and management service by video and the availability of in person appointments.  The patient expressed understanding and agreed to proceed.   Reason for visit: scheduled follow up.   HPI: History obtained from both the patient and her sister.  Reports she is doing relatively well.  Taking mestinon and MTX.  Myasthenia - stable.  Using cpap. Doing well with this.  Seeing Dr Nicolasa Ducking. On risperdol.  Stable.  No chest pain or sob.  On protonix.  Sister reports that pt has not had increased pain.  Bowels stable.  Saw Dr Ubaldo Glassing 04/2018.  Recommended f/u in one year.  Discussed diet and exercise.       ROS: See pertinent positives and negatives per HPI.  Past Medical History:  Diagnosis Date  . GERD (gastroesophageal reflux disease)   . History of seizure disorder   . Hypercholesterolemia   . Hypertension   . Irritable bowel syndrome   . Myasthenia gravis ([place])   . OSA on CPAP     Past Surgical History:  Procedure Laterality Date  . ABDOMINAL HYSTERECTOMY    . APPENDECTOMY  1999  . CHOLECYSTECTOMY  2013  . COLONOSCOPY WITH PROPOFOL N/A 06/30/2017   Procedure: COLONOSCOPY WITH PROPOFOL;  Surgeon: Toledo, Benay Pike, MD;   Location: [facility];  Service: Gastroenterology;  Laterality: N/A;  . ESOPHAGOGASTRODUODENOSCOPY (EGD) WITH PROPOFOL N/A 06/30/2017   Procedure: ESOPHAGOGASTRODUODENOSCOPY (EGD) WITH PROPOFOL;  Surgeon: Toledo, Benay Pike, MD;  Location: [facility];  Service: Gastroenterology;  Laterality: N/A;  . TONSILLECTOMY      Family History  Problem Relation Age of Onset  . CVA Maternal Grandfather   . Colon cancer Maternal Grandfather        stomach/colon  . Heart disease Father        myocardial infarction  . Hyperlipidemia Father   . Hyperlipidemia Sister   . Bone cancer Other        cousin  . Alzheimer's disease Mother        maternal aunts  . Myasthenia gravis Brother        ocular  . Breast cancer Neg Hx     SOCIAL HX: reviewed.    Current Outpatient Medications:  .  levothyroxine (SYNTHROID, LEVOTHROID) 75 MCG tablet, TAKE 1 TABLET BY MOUTH ONCE DAILY ON AN EMPTY STOMACH. WAIT 30 MINUTES BEFORE TAKING OTHER MEDS., Disp: 90 tablet, Rfl: 1 .  methotrexate (RHEUMATREX) 2.5 MG tablet, Take 6 tablets (15 mg total) by mouth once a week. Caution: Protect from light., Disp: 24 tablet, Rfl: 3 .  mupirocin ointment (BACTROBAN) 2 %, Place 1 application into the nose 2 (two) times daily., Disp: 22 g, Rfl: 0 .  pantoprazole (PROTONIX) 40 MG tablet, 40 mg daily., Disp: , Rfl:  .  pyridostigmine (  MESTINON) 60 MG tablet, TAKE 1 TABLET BY MOUTH AT  8 AM, ONE AT LUNCH AND ONE AT 8PM, Disp: 90 tablet, Rfl: 6 .  risperiDONE (RISPERDAL) 2 MG tablet, , Disp: , Rfl:  .  sucralfate (CARAFATE) 1 g tablet, Take 1 g by mouth 2 (two) times daily., Disp: , Rfl:  .  tolterodine (DETROL LA) 4 MG 24 hr capsule, TAKE 1 CAPSULE BY MOUTH ONCE EVERY MORNING, Disp: 90 capsule, Rfl: 0  EXAM:  GENERAL: alert, oriented, appears well and in no acute distress  HEENT: atraumatic, conjunttiva clear, no obvious abnormalities on inspection of external nose and ears  NECK: normal movements of the head and neck   LUNGS: on inspection no signs of respiratory distress, breathing rate appears normal, no obvious Kachel SOB, gasping or wheezing  CV: no obvious cyanosis  PSYCH/NEURO: pleasant and cooperative, no obvious depression or anxiety, speech and thought processing grossly intact  ASSESSMENT AND PLAN:  Discussed the following assessment and plan:  Abdominal pain, unspecified abdominal location  Hypercholesterolemia - Plan: Hepatic function panel, Lipid panel  Hyperglycemia - Plan: Hemoglobin A1c  Hypersomnia with sleep apnea  Essential hypertension - Plan: Basic metabolic panel  Malignant melanoma, unspecified site (HCC)  MG with exacerbation (myasthenia gravis) ([place])  Abdominal pain Sister reports this is not a significant issue for her now.  On protonix.  Follow.    Hypercholesterolemia Low cholesterol diet and exercise.  Declines cholesterol medication.  Follow lipid panel.   Hyperglycemia Low carb diet and exercise.  Follow met b and a1c.   Hypersomnia with sleep apnea Using cpap now.  Doing well.  Using regularly.    Hypertension Blood pressure has been doing well.  Follow pressures.  Follow metabolic panel.   Melanoma Followed by dermatology.   MG with exacerbation (myasthenia gravis) Followed by neurology.  On mestinon and MTX.      I discussed the assessment and treatment plan with the patient. The patient was provided an opportunity to ask questions and all were answered. The patient agreed with the plan and demonstrated an understanding of the instructions.   The patient was advised to call back or seek an in-person evaluation if the symptoms worsen or if the condition fails to improve as anticipated.    Einar Pheasant, MD

## 2018-07-24 ENCOUNTER — Encounter: Payer: Self-pay | Admitting: Internal Medicine

## 2018-07-24 NOTE — Assessment & Plan Note (Signed)
Using cpap now.  Doing well.  Using regularly.

## 2018-07-24 NOTE — Assessment & Plan Note (Signed)
Sister reports this is not a significant issue for her now.  On protonix.  Follow.

## 2018-07-24 NOTE — Assessment & Plan Note (Signed)
Followed by dermatology

## 2018-07-24 NOTE — Assessment & Plan Note (Signed)
Low cholesterol diet and exercise.  Declines cholesterol medication.  Follow lipid panel.

## 2018-07-24 NOTE — Assessment & Plan Note (Signed)
Blood pressure has been doing well.  Follow pressures.  Follow metabolic panel.   

## 2018-07-24 NOTE — Assessment & Plan Note (Signed)
Followed by neurology.  On mestinon and MTX.

## 2018-07-24 NOTE — Assessment & Plan Note (Signed)
Low carb diet and exercise.  Follow met b and a1c.  

## 2018-08-06 ENCOUNTER — Other Ambulatory Visit: Payer: Self-pay

## 2018-08-06 ENCOUNTER — Other Ambulatory Visit (INDEPENDENT_AMBULATORY_CARE_PROVIDER_SITE_OTHER): Payer: PPO

## 2018-08-06 DIAGNOSIS — E7800 Pure hypercholesterolemia, unspecified: Secondary | ICD-10-CM

## 2018-08-06 DIAGNOSIS — I10 Essential (primary) hypertension: Secondary | ICD-10-CM | POA: Diagnosis not present

## 2018-08-06 DIAGNOSIS — R739 Hyperglycemia, unspecified: Secondary | ICD-10-CM | POA: Diagnosis not present

## 2018-08-06 LAB — HEPATIC FUNCTION PANEL
ALT: 19 U/L (ref 0–35)
AST: 19 U/L (ref 0–37)
Albumin: 3.8 g/dL (ref 3.5–5.2)
Alkaline Phosphatase: 62 U/L (ref 39–117)
Bilirubin, Direct: 0.1 mg/dL (ref 0.0–0.3)
Total Bilirubin: 0.4 mg/dL (ref 0.2–1.2)
Total Protein: 5.8 g/dL — ABNORMAL LOW (ref 6.0–8.3)

## 2018-08-06 LAB — LIPID PANEL
Cholesterol: 184 mg/dL (ref 0–200)
HDL: 37.4 mg/dL — ABNORMAL LOW (ref >39.00)
LDL Cholesterol: 110 mg/dL — ABNORMAL HIGH (ref 0–99)
NonHDL: 146.8
Total CHOL/HDL Ratio: 5
Triglycerides: 182 mg/dL — ABNORMAL HIGH (ref 0.0–149.0)
VLDL: 36.4 mg/dL (ref 0.0–40.0)

## 2018-08-06 LAB — BASIC METABOLIC PANEL
BUN: 15 mg/dL (ref 6–23)
CO2: 27 mEq/L (ref 19–32)
Calcium: 8.7 mg/dL (ref 8.4–10.5)
Chloride: 107 mEq/L (ref 96–112)
Creatinine, Ser: 0.94 mg/dL (ref 0.40–1.20)
GFR: 59.37 mL/min — ABNORMAL LOW (ref >60.00)
Glucose, Bld: 88 mg/dL (ref 70–99)
Potassium: 3.8 mEq/L (ref 3.5–5.1)
Sodium: 141 mEq/L (ref 135–145)

## 2018-08-06 LAB — HEMOGLOBIN A1C: Hgb A1c MFr Bld: 5.5 % (ref 4.6–6.5)

## 2018-08-24 ENCOUNTER — Other Ambulatory Visit: Payer: Self-pay | Admitting: Internal Medicine

## 2018-09-16 DIAGNOSIS — F39 Unspecified mood [affective] disorder: Secondary | ICD-10-CM | POA: Diagnosis not present

## 2018-09-16 DIAGNOSIS — Z79899 Other long term (current) drug therapy: Secondary | ICD-10-CM | POA: Diagnosis not present

## 2018-09-16 DIAGNOSIS — F79 Unspecified intellectual disabilities: Secondary | ICD-10-CM | POA: Diagnosis not present

## 2018-09-16 DIAGNOSIS — G4700 Insomnia, unspecified: Secondary | ICD-10-CM | POA: Diagnosis not present

## 2018-09-28 DIAGNOSIS — B079 Viral wart, unspecified: Secondary | ICD-10-CM | POA: Diagnosis not present

## 2018-09-28 DIAGNOSIS — L03313 Cellulitis of chest wall: Secondary | ICD-10-CM | POA: Diagnosis not present

## 2018-10-04 ENCOUNTER — Other Ambulatory Visit: Payer: Self-pay | Admitting: Internal Medicine

## 2018-10-13 DIAGNOSIS — L720 Epidermal cyst: Secondary | ICD-10-CM | POA: Diagnosis not present

## 2018-10-13 DIAGNOSIS — L821 Other seborrheic keratosis: Secondary | ICD-10-CM | POA: Diagnosis not present

## 2018-11-02 NOTE — Progress Notes (Signed)
PATIENT: Madison Burns DOB: 04/12/1950  REASON FOR VISIT: follow up HISTORY FROM: patient  Chief Complaint  Patient presents with   Follow-up    6 mon f/u. Sister present. Rm 7. Patient's sister mentioned that she would like discuss Methotrexate.      HISTORY OF PRESENT ILLNESS: Today 11/03/18 Madison Burns is a 68 y.o. female here today for follow up of MG, seizure, and OSA on CPAP. She is here today with her sister , Madison Burns who aids in history. She continues mestinon and methotrexate for MG and feels symptoms are fairly stable. No vision changes, no double vision. No new or worsening weakness. She has lost some weight as they are eating at home more since pandemic. She has a good appetite. No trouble swallowing. No changes in gait or falls. She is able to perform ADL's. She lives with her sister. She has not taken seizure medication in many years with no seizure activity noted. No adverse effects or worsening symptoms with decreased dose of methotrexate.   Compliance report dated 10/03/2018 through 11/01/2018 reveals that she is using CPAP nightly for compliance 100%.  She is using CPAP greater than 4 hours every night.  Average usage is 9 hours and 57 minutes.  AHI is 1.9 on 5 cm of water.  There is no significant leak.  HISTORY: (copied from [employer] note on 01/05/2018)  HISTORY [name] a 68 y.o.femaleseen here as a referral from Dr. Essie Hart Myasthenia [name], per special request of the patient.  Madison Burns carries a diagnosis of myasthenia gravis associated with difficulties swallowing, with a decreased level of energy a high degree of fatigue sometimes best interest in activities facial droop eyelid droop sleepiness snoring easy bruising, coughing, trouble swallowing she has a mild hoarseness but not as significant dysphonia and incontinence.  Her family and the patient looking for a maintenance Neurologist to keep her myasthenia under control. The  patient is sleeping 12 hours easily per day, from 10 Pm to 8 AM. She has a droopy right eye.  She missed frequently the 3rd. Mestinon.  She has been followed by Dr. Melrose Nakayama at the [facility] clinic after following Dr. Paulla Dolly for years . The patient was diagnosed in 2011 with myasthenia gravis. She was hospitalized with myasthnic crisis Jan 27 2010- through [facility] 14th 2012. She was send to rehabilitation after that. Her sister noticed a decline in cognitive skills at that time.  Again, her first treatment had been prednisone and this was just weaned off by September 2015. The patient carries a diagnosis of hypercholesterolemia. She also has a past surgical history of appendectomy in 1999, hysterectomy in 2004 and the melanoma was removed in 2014 from the right side of the neck close to the TM joint.  Madison Burns is single without children, Lives with her sister now used to live with her mother. She is Madison Burns, she left school in 7th grade, went to a special facility until her GED, [employer]. She used to work at a [employer] ,later in a vocational trade.   Ms. Bruesch is taking methotrexate to control them myasthenia she is also on Mestinon 60 mg 3 times a day she takes Detrol for urinary continence at 4 mg in the morning and she takes Risperdal 2 mg I mouth at bedtime. She is also treated for hypothyroidism with levothyroxine at 50 g daily. The methotrexate is given at 8 tablets once a week. Until last September 2015 the patient had  been on prednisone. She has not had labs drawn for the last 5 month, while continuing on MTX.  4/19/17Today's visit is solely dedicated to the sleep disorder part of her neurological care Madison Burns underwent a sleep apnea test as a home sleep test, the AHI was 20.7 the lowest desaturation was 61 with 309 minutes of desaturation. This of course a possibility that in a home sleep test the hypoxemia is overestimated or artifact related but she does have a significant amount  of obstructive sleep apnea as well. For this reason and because of her underlying condition of myasthenia gravis the patient was asked to undergo an attended CPAP titration. This took place on 04-19-14 the AHI was 0.3 on CPAP of only 5 cm water. Her SPO2 nadir rose to 91% no added oxygen was needed. I prescribed a ResMed air-fluid P 10 in medium size and an auto CPAP from 4 through 8 cm water. The patient states that she has not received CPAP machine that she had an outstanding bill was advanced home care which may be the reason.  She is on Medicare/ Medicaid and a sleep study cannot be older than 6 months to allow her to obtain a machine - so I will have to repeat her sleep study. Her Epworth sleepiness score remains high at 17 points, her depression score at 3.5, fatigue severity questionnaire at 38 points. Madison Burns - needs assistence with CPAP, certainly would need a desensitization session with [facility] in [place].   Interval history from 06/06/2015 I had the pleasure of seeing Madison Burns today and in time after her last sleep study. She underwent a home sleep test on February 1 which confirmed that she has apnea at an AHI of 20.7 and oxygen nadir of 61% and a total desaturation time of 309 minutes. The study was followed by an in lab CPAP titration but she reached an AHI of 0.2. 5 cm water appeared to be effective. She is using a ResMed air fit P 10 in very small nasal pillows. The patient also endorsed the Epworth sleepiness score at 12 points now from 17 prior to CPAP use. She had is still in the habit of going to take daytime naps but she feels not as if she can't resist the urge to nap. I have also the first compliance report available today she is 100% compliant has used it every day of the last 30 days and every day over 4 hours. Her average user time is 10 hours 8 minutes, her AHI is 1.1. She is struggling a little bit with allergic rhinitis, but has been able to still comply with CPAP  use.  UPDATE 10/19/2017CMMs. Madison Burns, 68 year old female returns for follow-up with her mother. She has a history of obstructive sleep apnea on CPAP compliance report today is 100% for 30 days average usage 10 hours 6 minutes AHI is 1.1. No leaks. In addition she has myasthenia gravis and is currently on Mestinon and methotrexate. She needs refills. No recent falls. She has no facial weakness no trouble swallowing. She also has a history of seizure disorder but has not had seizures in many years and has been weaned off of her seizure medication. She returns for reevaluation  UPDATE 06/28/2018CM Madison Burns, 68 year old female returns for follow-up with her sister. Her mother recently died in Jul 06, 2022. She has a history of obstructive sleep apnea on CPAP she also has a history of myasthenia gravis and is currently on Mestinon and methotrexate. She had a history of  seizure disorder however she has been weaned off of her seizure medications without recurrent seizure activity. Her compliance report today from 07/15/2016 to  08/13/2016 greater than 4 hours for 29 days for 97%. Average usage 9 hours 51 minutes 5 cm of pressure. The EPR level III3 AHI 1.8. Repeat BMP and hepatic function from 06/17/2016. She returns for reevaluation Madison Burns a 68 y.o.femaleseen here as a referral from Dr. Essie Hart Myasthenia [name], per special request of the patient. She has OSA and is suing CPAP compliantly. She has been stabile in her weight.  02/23/17 CDI have the pleasure of following Madison Burns for her obstructive sleep apnea,and she has again proven to be a highly compliant CPAP user. Her CPAP compliance is 93% with 9 hours and 9 minutes on average daily use. CPAP is set at only 5 cm water pressure and allows for reduction of the AHI to 0.6. No central apneas are emerging and she has very few air leaks. Her baseline AHI in 02-2015 was 20.8/hr."She reports dreaming more- good dreams". She sleeps well. She has  trouble with swallowing solid foods. In addition she has not had significant muscle weakness, no falls, but she does feel easily fatigued and sleepy. She endorsed the Epworth score today on 11 and the fatigue severity at 54 points. 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 She often cried during this task.   UPDATE 5/7/2019CM Madison Burns, 68 year old female returns for follow-up history of obstructive sleep apnea on CPAP.  She also has a history of myasthenia gravis and her methotrexate was reduced from 8 tablets to 6 at her  last visit in January with Dr. Brett Fairy.  She remains on Mestinon.  She has not had any associated symptoms with the decrease in medication.  CPAP compliance dated 05/23/2017-06/21/2017 shows compliance greater than 4 hours at 100%.  Average usage 10 hours 15 minutes.  Set pressure 5 cm.  EPR level 3 AHI 0.4 leaks 95th percentile at 5.2.  She is tolerating her CPAP well.  She is sleeping well she returns for reevaluation.  UPDATE 11/19/2019CM Madison Burns, 68 year old female returns for follow-up with history of obstructive sleep apnea on CPAP.  She also has a history of myasthenia gravis and seizure disorder.  She is no longer on seizure medication and no seizures in many years.  CPAP compliance dated 12/05/2017-01/03/2018 shows compliance greater than 4 hours at 100%.  Set pressure 5 cm.  Average usage 10 hours 29 minutes.  No leak.  AHI 1.3.  She remains on Mestinon and methotrexate for her myasthenia gravis which has been stable.  She complains of intermittent ptosis of the right eye when fatigued.  This is not evident today.  She has no double vision.  Appetite is good and she sleeps well she returns for reevaluation   REVIEW OF SYSTEMS: Out of a complete 14 system review of symptoms, the patient complains only of the following symptoms, none and all other reviewed systems are negative.  Epworth sleepiness scale: 9 Fatigue severity scale:  36  ALLERGIES: Allergies  Allergen Reactions   Ciprofloxacin Other (See Comments)    Sister denies   Levofloxacin Other (See Comments)    Sister denies    HOME MEDICATIONS: Outpatient Medications Prior to Visit  Medication Sig Dispense Refill   levothyroxine (SYNTHROID) 75 MCG tablet TAKE 1 TABLET BY MOUTH ONCE DAILY ON AN EMPTY STOMACH. WAIT 30 MINUTES BEFORE TAKING OTHER MEDS. 90 tablet  1   mupirocin ointment (BACTROBAN) 2 % Place 1 application into the nose 2 (two) times daily. 22 g 0   pantoprazole (PROTONIX) 40 MG tablet 40 mg daily.     risperiDONE (RISPERDAL) 2 MG tablet      sucralfate (CARAFATE) 1 g tablet Take 1 g by mouth 2 (two) times daily.     tolterodine (DETROL LA) 4 MG 24 hr capsule TAKE 1 CAPSULE BY MOUTH ONCE EVERY MORNING 90 capsule 1   methotrexate (RHEUMATREX) 2.5 MG tablet Take 6 tablets (15 mg total) by mouth once a week. Caution: Protect from light. 24 tablet 3   pyridostigmine (MESTINON) 60 MG tablet TAKE 1 TABLET BY MOUTH AT  8 AM, ONE AT LUNCH AND ONE AT 8PM 90 tablet 6   No facility-administered medications prior to visit.     PAST MEDICAL HISTORY: Past Medical History:  Diagnosis Date   GERD (gastroesophageal reflux disease)    History of seizure disorder    Hypercholesterolemia    Hypertension    Irritable bowel syndrome    Myasthenia gravis ([place])    OSA on CPAP     PAST SURGICAL HISTORY: Past Surgical History:  Procedure Laterality Date   ABDOMINAL HYSTERECTOMY     APPENDECTOMY  1999   CHOLECYSTECTOMY  2013   COLONOSCOPY WITH PROPOFOL N/A 06/30/2017   Procedure: COLONOSCOPY WITH PROPOFOL;  Surgeon: Toledo, Benay Pike, MD;  Location: [facility] ENDOSCOPY;  Service: Gastroenterology;  Laterality: N/A;   ESOPHAGOGASTRODUODENOSCOPY (EGD) WITH PROPOFOL N/A 06/30/2017   Procedure: ESOPHAGOGASTRODUODENOSCOPY (EGD) WITH PROPOFOL;  Surgeon: Toledo, Benay Pike, MD;  Location: [facility] ENDOSCOPY;  Service: Gastroenterology;  Laterality: N/A;    TONSILLECTOMY      FAMILY HISTORY: Family History  Problem Relation Age of Onset   CVA Maternal Grandfather    Colon cancer Maternal Grandfather        stomach/colon   Heart disease Father        myocardial infarction   Hyperlipidemia Father    Hyperlipidemia Sister    Bone cancer Other        cousin   Alzheimer's disease Mother        maternal aunts   Myasthenia gravis Brother        ocular   Breast cancer Neg Hx     SOCIAL HISTORY: Social History   Socioeconomic History   Marital status: Single    Spouse name: Not on file   Number of children: 0   Years of education: Special Ed   Highest education level: Not on file  Occupational History   Occupation: Unemployed  Scientist, product/process development strain: Not on file   Food insecurity    Worry: Not on file    Inability: Not on file   Transportation needs    Medical: Not on file    Non-medical: Not on file  Tobacco Use   Smoking status: Never Smoker   Smokeless tobacco: Never Used  Substance and Sexual Activity   Alcohol use: No    Alcohol/week: 0.0 standard drinks   Drug use: No   Sexual activity: Not on file  Lifestyle   Physical activity    Days per week: Not on file    Minutes per session: Not on file   Stress: Not on file  Relationships   Social connections    Talks on phone: Not on file    Gets together: Not on file    Attends religious service: Not on file  Active member of club or organization: Not on file    Attends meetings of clubs or organizations: Not on file    Relationship status: Not on file   Intimate partner violence    Fear of current or ex partner: Not on file    Emotionally abused: Not on file    Physically abused: Not on file    Forced sexual activity: Not on file  Other Topics Concern   Not on file  Social History Narrative   06/23/17 lives with sister, Madison Burns   Regular exercise-no   Caffeine Use-yes      PHYSICAL EXAM  Vitals:    11/03/18 1324  BP: (!) 129/91  Pulse: 95  Temp: (!) 97.5 F (36.4 C)  TempSrc: Oral  Weight: 211 lb 3.2 oz (95.8 kg)  Height: 5\' 3"  (1.6 m)   Body mass index is 37.41 kg/m.  Generalized: Well developed, in no acute distress  Neurological examination  Mentation: Alert oriented to time, place, history taking. Follows all commands, dysphonia  Cranial nerve II-XII: Pupils were equal round reactive to light. Extraocular movements were full, visual field were full on confrontational test. Facial sensation and strength were normal. Uvula tongue midline. Head turning and shoulder shrug  were normal and symmetric. Motor: The motor testing reveals 5 over 5 strength of all 4 extremities. Good symmetric motor tone is noted throughout.  Sensory: Sensory testing is intact to soft touch on all 4 extremities. No evidence of extinction is noted.  Coordination: Cerebellar testing reveals good finger-nose-finger and heel-to-shin bilaterally.  Gait and station: Gait is normal. Tandem gait not attempted. Romberg is negative. No drift is seen.  Reflexes: Deep tendon reflexes are symmetric and normal bilaterally.   DIAGNOSTIC DATA (LABS, IMAGING, TESTING) - I reviewed patient records, labs, notes, testing and imaging myself where available.  No flowsheet data found.   Lab Results  Component Value Date   WBC 9.6 11/20/2017   HGB 14.5 11/20/2017   HCT 42.9 11/20/2017   MCV 98.7 11/20/2017   PLT 215.0 11/20/2017      Component Value Date/Time   NA 141 08/06/2018 1007   NA 144 04/04/2015 1600   NA 137 02/21/2012 1828   K 3.8 08/06/2018 1007   K 4.3 02/21/2012 1828   CL 107 08/06/2018 1007   CL 104 02/21/2012 1828   CO2 27 08/06/2018 1007   CO2 25 02/21/2012 1828   GLUCOSE 88 08/06/2018 1007   GLUCOSE 145 (H) 02/21/2012 1828   BUN 15 08/06/2018 1007   BUN 8 04/04/2015 1600   BUN 8 02/21/2012 1828   CREATININE 0.94 08/06/2018 1007   CREATININE 1.14 02/21/2012 1828   CALCIUM 8.7 08/06/2018  1007   CALCIUM 9.0 02/21/2012 1828   PROT 5.8 (L) 08/06/2018 1007   PROT 6.0 04/04/2015 1600   PROT 6.4 04/25/2011 0854   ALBUMIN 3.8 08/06/2018 1007   ALBUMIN 4.0 04/04/2015 1600   ALBUMIN 3.4 04/25/2011 0854   AST 19 08/06/2018 1007   AST 18 04/25/2011 0854   ALT 19 08/06/2018 1007   ALT 25 04/25/2011 0854   ALKPHOS 62 08/06/2018 1007   ALKPHOS 45 (L) 04/25/2011 0854   BILITOT 0.4 08/06/2018 1007   BILITOT 0.4 04/04/2015 1600   BILITOT 0.4 04/25/2011 0854   GFRNONAA 80 04/04/2015 1600   GFRNONAA 52 (L) 02/21/2012 1828   GFRAA 92 04/04/2015 1600   GFRAA >60 02/21/2012 1828   Lab Results  Component Value Date   CHOL 184 08/06/2018  HDL 37.40 (L) 08/06/2018   LDLCALC 110 (H) 08/06/2018   LDLDIRECT 103.0 11/20/2017   TRIG 182.0 (H) 08/06/2018   CHOLHDL 5 08/06/2018   Lab Results  Component Value Date   HGBA1C 5.5 08/06/2018   No results found for: VITAMINB12 Lab Results  Component Value Date   TSH 2.07 03/30/2018     ASSESSMENT AND PLAN 68 y.o. year old female  has a past medical history of GERD (gastroesophageal reflux disease), History of seizure disorder, Hypercholesterolemia, Hypertension, Irritable bowel syndrome, Myasthenia gravis ([place]), and OSA on CPAP. here with     ICD-10-CM   1. Myasthenia gravis ([place])  G70.00   2. OSA on CPAP  G47.33    Z99.89   3. History of seizure disorder  Z86.69   4. Idiopathic profound intellectual disability  F73 pyridostigmine (MESTINON) 60 MG tablet  5. Epilepsy, generalized, convulsive ([place])  G40.309 pyridostigmine (MESTINON) 60 MG tablet  6. OSA (obstructive sleep apnea)  G47.33 pyridostigmine (MESTINON) 60 MG tablet  7. Myasthenia gravis in remission ([facility])  G70.00 pyridostigmine (MESTINON) 60 MG tablet    Sheryle is doing very well on Mestinon and methotrexate as prescribed.  We will continue Mestinon 60 mg 3 times daily as prescribed.  We will continue weaning Imitrex 8.  I have recommended they start 10 mg weekly (5  tablets) starting this Saturday.  Patient and her sister educated on side effects of medication and when to notify us for any worsening or new symptoms.  There has been no seizure activity.  We will continue to monitor for now.  She was encouraged to continue using CPAP therapy nightly and for greater than 4 hours each night.  She will follow-up with Dr. Brett Fairy in 4 to 6 months.  She and her sister both verbalized understanding and agreement with this plan.   No orders of the defined types were placed in this encounter.    Meds ordered this encounter  Medications   methotrexate (RHEUMATREX) 2.5 MG tablet    Sig: Take 4 tablets (10 mg total) by mouth once a week. Caution: Protect from light.    Dispense:  16 tablet    Refill:  3    Pt needs an appt for further refills.    Order Specific Question:   Supervising Provider    Answer:   Melvenia Beam [id number]   pyridostigmine (MESTINON) 60 MG tablet    Sig: TAKE 1 TABLET BY MOUTH AT  8 AM, ONE AT LUNCH AND ONE AT 8PM    Dispense:  90 tablet    Refill:  6    Order Specific Question:   Supervising Provider    Answer:   Melvenia Beam [id number]      I spent 15 minutes with the patient. 50% of this time was spent counseling and educating patient on plan of care and medications.    [name], FNP-C 11/03/2018, 3:10 PM [facility] [address] [place], [place] [address] [phone]

## 2018-11-02 NOTE — Patient Instructions (Addendum)
Continue mestinon as prescribed  Decrease methotrexate to 4 tablets (10mg ) weekly   Follow up in 4 months with Dr Brett Fairy, sooner if needed   Sleep Apnea Sleep apnea affects breathing during sleep. It causes breathing to stop for a short time or to become shallow. It can also increase the risk of:  Heart attack.  Stroke.  Being very overweight (obese).  Diabetes.  Heart failure.  Irregular heartbeat. The goal of treatment is to help you breathe normally again. What are the causes? There are three kinds of sleep apnea:  Obstructive sleep apnea. This is caused by a blocked or collapsed airway.  Central sleep apnea. This happens when the brain does not send the right signals to the muscles that control breathing.  Mixed sleep apnea. This is a combination of obstructive and central sleep apnea. The most common cause of this condition is a collapsed or blocked airway. This can happen if:  Your throat muscles are too relaxed.  Your tongue and tonsils are too large.  You are overweight.  Your airway is too small. What increases the risk?  Being overweight.  Smoking.  Having a small airway.  Being older.  Being female.  Drinking alcohol.  Taking medicines to calm yourself (sedatives or tranquilizers).  Having family members with the condition. What are the signs or symptoms?  Trouble staying asleep.  Being sleepy or tired during the day.  Getting angry a lot.  Loud snoring.  Headaches in the morning.  Not being able to focus your mind (concentrate).  Forgetting things.  Less interest in sex.  Mood swings.  Personality changes.  Feelings of sadness (depression).  Waking up a lot during the night to pee (urinate).  Dry mouth.  Sore throat. How is this diagnosed?  Your medical history.  A physical exam.  A test that is done when you are sleeping (sleep study). The test is most often done in a sleep lab but may also be done at home. How  is this treated?   Sleeping on your side.  Using a medicine to get rid of mucus in your nose (decongestant).  Avoiding the use of alcohol, medicines to help you relax, or certain pain medicines (narcotics).  Losing weight, if needed.  Changing your diet.  Not smoking.  Using a machine to open your airway while you sleep, such as: ? An oral appliance. This is a mouthpiece that shifts your lower jaw forward. ? A CPAP device. This device blows air through a mask when you breathe out (exhale). ? An EPAP device. This has valves that you put in each nostril. ? A BPAP device. This device blows air through a mask when you breathe in (inhale) and breathe out.  Having surgery if other treatments do not work. It is important to get treatment for sleep apnea. Without treatment, it can lead to:  High blood pressure.  Coronary artery disease.  In men, not being able to have an erection (impotence).  Reduced thinking ability. Follow these instructions at home: Lifestyle  Make changes that your doctor recommends.  Eat a healthy diet.  Lose weight if needed.  Avoid alcohol, medicines to help you relax, and some pain medicines.  Do not use any products that contain nicotine or tobacco, such as cigarettes, e-cigarettes, and chewing tobacco. If you need help quitting, ask your doctor. General instructions  Take over-the-counter and prescription medicines only as told by your doctor.  If you were given a machine to use while  you sleep, use it only as told by your doctor.  If you are having surgery, make sure to tell your doctor you have sleep apnea. You may need to bring your device with you.  Keep all follow-up visits as told by your doctor. This is important. Contact a doctor if:  The machine that you were given to use during sleep bothers you or does not seem to be working.  You do not get better.  You get worse. Get help right away if:  Your chest hurts.  You have  trouble breathing in enough air.  You have an uncomfortable feeling in your back, arms, or stomach.  You have trouble talking.  One side of your body feels weak.  A part of your face is hanging down. These symptoms may be an emergency. Do not wait to see if the symptoms will go away. Get medical help right away. Call your local emergency services (911 in the [place] Do not drive yourself to the hospital. Summary  This condition affects breathing during sleep.  The most common cause is a collapsed or blocked airway.  The goal of treatment is to help you breathe normally while you sleep. This information is not intended to replace advice given to you by your health care provider. Make sure you discuss any questions you have with your health care provider. Document Released: 11/13/2007 Document Revised: 11/20/2017 Document Reviewed: 09/29/2017 Elsevier Patient Education  2020 [employer].   Myasthenia Gravis Myasthenia gravis (MG) is a long-term (chronic) condition that causes weakness in the muscles you can control (voluntary muscles). MG can affect any voluntary muscle. The muscles most often affected are the ones that control:  Eye movement.  Facial movements.  Swallowing. MG is a disease in which the body's disease-fighting system (immune system) attacks its own healthy tissues (autoimmune disease). When you have MG, your immune system makes proteins (antibodies) that block the chemical (acetylcholine) that your body needs to send nerve signals to your muscles. This causes muscle weakness. What are the causes? The exact cause of MG is not known. What increases the risk? The following factors may make you more likely to develop this condition:  Having an enlarged thymus gland. The thymus gland is located under the breastbone. It makes certain cells for the immune system.  Having a family history of MG. What are the signs or symptoms? Symptoms of MG may include:  Drooping  eyelids.  Double vision.  Muscle weakness that gets worse with activity and gets better after rest.  Difficulty walking.  Trouble chewing and swallowing.  Trouble making facial expressions.  Slurred speech.  Weakness of the arms, hands, and legs. Sudden, severe difficulty breathing (myasthenic crisis) may develop after having:  An infection.  A fever.  A bad reaction to a medicine. Myasthenic crisis requires emergency breathing support. Sometimes symptoms of MG go away for a while (remission) and then come back later. How is this diagnosed? This condition may be diagnosed based on:  Your symptoms and medical history.  A physical exam.  Blood tests.  Tests of your muscle strength and function.  Imaging tests, such as a CT scan or an MRI. How is this treated? The goal of treatment is to improve muscle strength. Treatment may include:  Taking medicine.  Making lifestyle changes that focus on saving your energy.  Doing physical therapy to gain strength.  Having surgery to remove the thymus gland (thymectomy). This may result in a long remission for some people.  Having a procedure to remove the acetylcholine antibodies (plasmapheresis).  Getting emergency breathing support, if you experience myasthenic crisis. If you experience remission, you may be able to stop treatment and then resume treatment when your symptoms return. Follow these instructions at home:   Take over-the-counter and prescription medicines only as told by your health care provider.  Get plenty of rest and sleep. Take frequent breaks to rest your eyes, especially when in bright light or working on a computer.  Maintain a healthy diet and a healthy weight. Work with your health care provider or a diet and nutrition specialist (dietitian) if you need help.  Do exercises as told by your health care provider or physical therapist.  Do not use any products that contain nicotine or tobacco, such  as cigarettes and e-cigarettes. If you need help quitting, ask your health care provider.  Prevent infections by: ? Washing your hands often with soap and water. If soap and water are not available, use hand sanitizer. ? Avoiding contact with other people who are sick. ? Avoiding touching your eyes, nose, and mouth. ? Cleaning surfaces in your home that are touched often using a disinfectant.  Keep all follow-up visits as told by your health care provider. This is important. Contact a health care provider if:  Your symptoms change or get worse, especially after having a fever or infection. Get help right away if:  You have trouble breathing. Summary  Myasthenia gravis (MG) is a long-term (chronic) condition that causes weakness in the muscles you can control (voluntary muscles).  A symptom of MG is muscle weakness that gets worse with activity and gets better after rest.  Sudden, severe difficulty breathing (myasthenic crisis) may develop after having an infection, a fever, or a bad reaction to a medicine.  The goal of treatment is to improve muscle strength. Treatment may include medicines, lifestyle changes, physical therapy, surgery, plasmapheresis, or emergency breathing support. This information is not intended to replace advice given to you by your health care provider. Make sure you discuss any questions you have with your health care provider. Document Released: 05/12/2000 Document Revised: 02/16/2017 Document Reviewed: 02/16/2017 Elsevier Patient Education  2020 [employer].

## 2018-11-03 ENCOUNTER — Ambulatory Visit (INDEPENDENT_AMBULATORY_CARE_PROVIDER_SITE_OTHER): Payer: PPO | Admitting: Family Medicine

## 2018-11-03 ENCOUNTER — Encounter: Payer: Self-pay | Admitting: Family Medicine

## 2018-11-03 ENCOUNTER — Other Ambulatory Visit: Payer: Self-pay

## 2018-11-03 VITALS — BP 129/91 | HR 95 | Temp 97.5°F | Ht 63.0 in | Wt 211.2 lb

## 2018-11-03 DIAGNOSIS — G7000 Myasthenia gravis without (acute) exacerbation: Secondary | ICD-10-CM

## 2018-11-03 DIAGNOSIS — F73 Profound intellectual disabilities: Secondary | ICD-10-CM

## 2018-11-03 DIAGNOSIS — Z8669 Personal history of other diseases of the nervous system and sense organs: Secondary | ICD-10-CM

## 2018-11-03 DIAGNOSIS — Z9989 Dependence on other enabling machines and devices: Secondary | ICD-10-CM | POA: Diagnosis not present

## 2018-11-03 DIAGNOSIS — G4733 Obstructive sleep apnea (adult) (pediatric): Secondary | ICD-10-CM | POA: Diagnosis not present

## 2018-11-03 DIAGNOSIS — G40309 Generalized idiopathic epilepsy and epileptic syndromes, not intractable, without status epilepticus: Secondary | ICD-10-CM | POA: Diagnosis not present

## 2018-11-03 MED ORDER — PYRIDOSTIGMINE BROMIDE 60 MG PO TABS: ORAL_TABLET | ORAL | 6 refills | Status: DC

## 2018-11-03 MED ORDER — METHOTREXATE 2.5 MG PO TABS: 10.0000 mg | ORAL_TABLET | ORAL | 3 refills | Status: DC

## 2018-11-23 ENCOUNTER — Other Ambulatory Visit: Payer: Self-pay | Admitting: Internal Medicine

## 2018-11-25 ENCOUNTER — Other Ambulatory Visit: Payer: Self-pay

## 2018-11-30 ENCOUNTER — Ambulatory Visit (INDEPENDENT_AMBULATORY_CARE_PROVIDER_SITE_OTHER): Payer: PPO | Admitting: Internal Medicine

## 2018-11-30 ENCOUNTER — Ambulatory Visit
Admission: RE | Admit: 2018-11-30 | Discharge: 2018-11-30 | Disposition: A | Discharge status: Home / Self Care | Payer: PPO | Source: Ambulatory Visit | Attending: Internal Medicine | Admitting: Internal Medicine

## 2018-11-30 ENCOUNTER — Encounter: Payer: Self-pay | Admitting: Internal Medicine

## 2018-11-30 ENCOUNTER — Other Ambulatory Visit: Payer: Self-pay

## 2018-11-30 VITALS — BP 130/72 | HR 75 | Temp 97.8°F | Resp 16 | Ht 63.0 in | Wt 208.4 lb

## 2018-11-30 DIAGNOSIS — E669 Obesity, unspecified: Secondary | ICD-10-CM

## 2018-11-30 DIAGNOSIS — R05 Cough: Secondary | ICD-10-CM | POA: Diagnosis not present

## 2018-11-30 DIAGNOSIS — G4733 Obstructive sleep apnea (adult) (pediatric): Secondary | ICD-10-CM | POA: Diagnosis not present

## 2018-11-30 DIAGNOSIS — R739 Hyperglycemia, unspecified: Secondary | ICD-10-CM

## 2018-11-30 DIAGNOSIS — R109 Unspecified abdominal pain: Secondary | ICD-10-CM

## 2018-11-30 DIAGNOSIS — F39 Unspecified mood [affective] disorder: Secondary | ICD-10-CM | POA: Diagnosis not present

## 2018-11-30 DIAGNOSIS — Z0001 Encounter for general adult medical examination with abnormal findings: Secondary | ICD-10-CM | POA: Diagnosis not present

## 2018-11-30 DIAGNOSIS — C439 Malignant melanoma of skin, unspecified: Secondary | ICD-10-CM | POA: Diagnosis not present

## 2018-11-30 DIAGNOSIS — R079 Chest pain, unspecified: Secondary | ICD-10-CM | POA: Diagnosis not present

## 2018-11-30 DIAGNOSIS — E7800 Pure hypercholesterolemia, unspecified: Secondary | ICD-10-CM | POA: Diagnosis not present

## 2018-11-30 DIAGNOSIS — I10 Essential (primary) hypertension: Secondary | ICD-10-CM

## 2018-11-30 DIAGNOSIS — G7000 Myasthenia gravis without (acute) exacerbation: Secondary | ICD-10-CM

## 2018-11-30 DIAGNOSIS — N6489 Other specified disorders of breast: Secondary | ICD-10-CM

## 2018-11-30 DIAGNOSIS — Z0000 Encounter for general adult medical examination without abnormal findings: Secondary | ICD-10-CM

## 2018-11-30 DIAGNOSIS — R059 Cough, unspecified: Secondary | ICD-10-CM

## 2018-11-30 MED ORDER — BENZONATATE 100 MG PO CAPS: 100.0000 mg | ORAL_CAPSULE | Freq: Two times a day (BID) | ORAL | 0 refills | Status: DC | PRN

## 2018-11-30 NOTE — Progress Notes (Signed)
Patient ID: Madison Burns, female   DOB: April 13, 1950, 68 y.o.   MRN: [id number]   Subjective:    Patient ID: Madison Burns, female    DOB: [birth year], 68 y.o.   MRN: [id number]  HPI  Patient here for her physical exam.  She is accompanied by her sister.  History obtained from both of them. Reports she is doing relatively well.  Does report had chest pain recently.  Unsure of trigger.  May have lasted a couple of hours.  No pain since.  No acid reflux.  Breathing stable.  Some cough.  States has had this cough previously.  Feels comes from drainage.  Worse in the evening.  No fever.  No known covid exposures.  No increased abdominal pain.  Bowels moving.  No urine change.  Saw neurology 11/03/18.  Stable.  Continues on MTX and mestinon.  Using cpap.  On risperdal.  Followed by Dr Nicolasa Ducking.  Found a lump in her breast - left breast.  No nipple discharge reported.     Past Medical History:  Diagnosis Date  . GERD (gastroesophageal reflux disease)   . History of seizure disorder   . Hypercholesterolemia   . Hypertension   . Irritable bowel syndrome   . Myasthenia gravis ([place])   . OSA on CPAP    Past Surgical History:  Procedure Laterality Date  . ABDOMINAL HYSTERECTOMY    . APPENDECTOMY  1999  . CHOLECYSTECTOMY  2013  . COLONOSCOPY WITH PROPOFOL N/A 06/30/2017   Procedure: COLONOSCOPY WITH PROPOFOL;  Surgeon: Toledo, Benay Pike, MD;  Location: [facility] ENDOSCOPY;  Service: Gastroenterology;  Laterality: N/A;  . ESOPHAGOGASTRODUODENOSCOPY (EGD) WITH PROPOFOL N/A 06/30/2017   Procedure: ESOPHAGOGASTRODUODENOSCOPY (EGD) WITH PROPOFOL;  Surgeon: Toledo, Benay Pike, MD;  Location: [facility] ENDOSCOPY;  Service: Gastroenterology;  Laterality: N/A;  . TONSILLECTOMY     Family History  Problem Relation Age of Onset  . CVA Maternal Grandfather   . Colon cancer Maternal Grandfather        stomach/colon  . Heart disease Father        myocardial infarction  . Hyperlipidemia Father   . Hyperlipidemia Sister    . Bone cancer Other        cousin  . Alzheimer's disease Mother        maternal aunts  . Myasthenia gravis Brother        ocular  . Breast cancer Neg Hx    Social History   Socioeconomic History  . Marital status: Single    Spouse name: Not on file  . Number of children: 0  . Years of education: Special Ed  . Highest education level: Not on file  Occupational History  . Occupation: Unemployed  Social Needs  . Financial resource strain: Not on file  . Food insecurity    Worry: Not on file    Inability: Not on file  . Transportation needs    Medical: Not on file    Non-medical: Not on file  Tobacco Use  . Smoking status: Never Smoker  . Smokeless tobacco: Never Used  Substance and Sexual Activity  . Alcohol use: No    Alcohol/week: 0.0 standard drinks  . Drug use: No  . Sexual activity: Not on file  Lifestyle  . Physical activity    Days per week: Not on file    Minutes per session: Not on file  . Stress: Not on file  Relationships  . Social Herbalist on phone:  Not on file    Gets together: Not on file    Attends religious service: Not on file    Active member of club or organization: Not on file    Attends meetings of clubs or organizations: Not on file    Relationship status: Not on file  Other Topics Concern  . Not on file  Social History Narrative   06/23/17 lives with sister, [name]   Regular exercise-no   Caffeine Use-yes    Outpatient Encounter Medications as of 11/30/2018  Medication Sig  . benzonatate (TESSALON) 100 MG capsule Take 1 capsule (100 mg total) by mouth 2 (two) times daily as needed for cough.  . levothyroxine (SYNTHROID) 75 MCG tablet TAKE 1 TABLET BY MOUTH ONCE DAILY ON AN EMPTY STOMACH. WAIT 30 MINUTES BEFORE TAKING OTHER MEDS.  . methotrexate (RHEUMATREX) 2.5 MG tablet Take 4 tablets (10 mg total) by mouth once a week. Caution: Protect from light.  . pantoprazole (PROTONIX) 40 MG tablet 40 mg daily.  [name] pyridostigmine  (MESTINON) 60 MG tablet TAKE 1 TABLET BY MOUTH AT  8 AM, ONE AT LUNCH AND ONE AT 8PM  . risperiDONE (RISPERDAL) 2 MG tablet   . sucralfate (CARAFATE) 1 g tablet Take 1 g by mouth 2 (two) times daily.  [name] tolterodine (DETROL Burns) 4 MG 24 hr capsule TAKE 1 CAPSULE BY MOUTH ONCE EVERY MORNING  . [DISCONTINUED] mupirocin ointment (BACTROBAN) 2 % Place 1 application into the nose 2 (two) times daily.   No facility-administered encounter medications on file as of 11/30/2018.     Review of Systems  Constitutional: Negative for appetite change and unexpected weight change.  HENT: Negative for congestion and sinus pressure.   Eyes: Negative for pain and visual disturbance.  Respiratory: Positive for cough. Negative for chest tightness.        Breathing stable.   Cardiovascular: Positive for chest pain. Negative for palpitations and leg swelling.       Had the episode of chest pain as outlined.    Gastrointestinal: Negative for abdominal pain, diarrhea, nausea and vomiting.  Genitourinary: Negative for difficulty urinating and dysuria.  Musculoskeletal: Negative for joint swelling and myalgias.  Skin: Negative for color change and rash.  Neurological: Negative for dizziness, light-headedness and headaches.  Hematological: Negative for adenopathy. Does not bruise/bleed easily.  Psychiatric/Behavioral: Negative for agitation and dysphoric mood.       Objective:    Physical Exam Constitutional:      General: She is not in acute distress.    Appearance: Normal appearance.  HENT:     Right Ear: External ear normal.     Left Ear: External ear normal.  Eyes:     General: No scleral icterus.       Right eye: No discharge.        Left eye: No discharge.     Conjunctiva/sclera: Conjunctivae normal.  Neck:     Musculoskeletal: Neck supple. No muscular tenderness.     Thyroid: No thyromegaly.  Cardiovascular:     Rate and Rhythm: Normal rate and regular rhythm.  Pulmonary:     Effort: No  respiratory distress.     Breath sounds: Normal breath sounds. No wheezing.     Comments: Breasts:  Nodule left breast 9:00 region.  No nipple discharge or nipple retraction present.  No other palpable nodules.  No axillary adenopathy.   Abdominal:     General: Bowel sounds are normal.     Palpations: Abdomen is soft.  Tenderness: There is no abdominal tenderness.  Genitourinary:    Comments: Not performed.   Musculoskeletal:        General: No swelling or tenderness.  Lymphadenopathy:     Cervical: No cervical adenopathy.  Skin:    Findings: No erythema or rash.  Neurological:     Mental Status: She is alert.  Psychiatric:        Mood and Affect: Mood normal.        Behavior: Behavior normal.     BP 130/72   Pulse 75   Temp 97.8 F (36.6 C)   Resp 16   Ht 5' 3"  (1.6 m)   Wt 208 lb 6.4 oz (94.5 kg)   SpO2 97%   BMI 36.92 kg/m  Wt Readings from Last 3 Encounters:  11/30/18 208 lb 6.4 oz (94.5 kg)  11/03/18 211 lb 3.2 oz (95.8 kg)  03/16/18 221 lb (100.2 kg)     Lab Results  Component Value Date   WBC 9.6 11/20/2017   HGB 14.5 11/20/2017   HCT 42.9 11/20/2017   PLT 215.0 11/20/2017   GLUCOSE 88 08/06/2018   CHOL 184 08/06/2018   TRIG 182.0 (H) 08/06/2018   HDL 37.40 (L) 08/06/2018   LDLDIRECT 103.0 11/20/2017   LDLCALC 110 (H) 08/06/2018   ALT 19 08/06/2018   AST 19 08/06/2018   NA 141 08/06/2018   K 3.8 08/06/2018   CL 107 08/06/2018   CREATININE 0.94 08/06/2018   BUN 15 08/06/2018   CO2 27 08/06/2018   TSH 2.07 03/30/2018   HGBA1C 5.5 08/06/2018    Nm Gastric Emptying  Result Date: 12/18/2017 CLINICAL DATA:  Early satiety and diarrhea EXAM: NUCLEAR MEDICINE GASTRIC EMPTYING SCAN TECHNIQUE: After oral ingestion of radiolabeled meal, sequential abdominal images were obtained for 4 hours. Percentage of activity emptying the stomach was calculated at 1 hour, 2 hour, 3 hour, and 4 hours. RADIOPHARMACEUTICALS:  2.29 mCi Tc-60msulfur colloid in  standardized meal including egg COMPARISON:  None. FINDINGS: Expected location of the stomach in the left upper quadrant. Ingested meal empties the stomach gradually over the course of the study. 38% emptied at 1 hr ( normal >= 10%) 54% emptied at 2 hr ( normal >= 40%) 67% emptied at 3 hr ( normal >= 70%) 92% emptied at 4 hr ( normal >= 90%) IMPRESSION: Normal gastric emptying study. Electronically Signed   By: WLowella GripIII M.D.   On: 12/18/2017 13:55       Assessment & Plan:   Problem List Items Addressed This Visit    Abdominal pain    Sister reports she does not report abdominal pain.  Follow.  On protonix.        Chest pain    Chest pain as outlined.  EKG - SR with no acute ischemic changes.  Schedule f/u with Dr FUbaldo Glassingto determine if further cardiac w/up warranted.       Relevant Orders   EKG 12-Lead (Completed)   Ambulatory referral to Cardiology   Cough    Dry cough - as outlined.  Drainage.  Nasal spray.  Tessalon perles.  Check cxr.        Relevant Orders   DG Chest 2 View (Completed)   Fullness of breast    Exam as outlined.  Fullness - 9:00 left breast.  Diagnostic mammogram and ultrasound.        Relevant Orders   MM DIAG BREAST TOMO BILATERAL   [place]AST LTD UNI  LEFT INC AXILLA   US BREAST LTD UNI RIGHT [place] care maintenance    Physical today 11/30/18.  Schedule diagnostic mammogram as outlined.  Colonoscopy 06/30/17 - diverticulosis and non bleeding internal hemorrhoids.        Hypercholesterolemia    Low cholesterol diet and exercise.  Follow lipid panel.  Declines cholesterol medication.       Relevant Orders   Hepatic function panel   Lipid panel   Hyperglycemia    Low carb diet and exercise.  Follow met b and a1c.        Relevant Orders   Hemoglobin A1c   Hypertension    Blood pressure under good control.  Continue same medication regimen.  Follow pressures.  Follow metabolic panel.        Relevant Orders   CBC with  Differential/Platelet   Basic metabolic panel   Melanoma ([place])    Followed by dermatology.        Mood disorder ([place])    Followed by psychiatry.        Myasthenia gravis ([place])    Followed by neurology.  On mestinon and MTX.        Relevant Orders   Hepatic function panel   Obesity (BMI 30-39.9)    Diet and exercise.  Follow.        OSA (obstructive sleep apnea)    CPAP.           Einar Pheasant, MD

## 2018-12-05 ENCOUNTER — Encounter: Payer: Self-pay | Admitting: Internal Medicine

## 2018-12-05 DIAGNOSIS — R079 Chest pain, unspecified: Secondary | ICD-10-CM | POA: Insufficient documentation

## 2018-12-05 NOTE — Assessment & Plan Note (Signed)
Low carb diet and exercise.  Follow met b and a1c.   

## 2018-12-05 NOTE — Assessment & Plan Note (Signed)
Blood pressure under good control.  Continue same medication regimen.  Follow pressures.  Follow metabolic panel.   

## 2018-12-05 NOTE — Assessment & Plan Note (Signed)
Followed by psychiatry 

## 2018-12-05 NOTE — Assessment & Plan Note (Signed)
Followed by dermatology

## 2018-12-05 NOTE — Assessment & Plan Note (Signed)
Sister reports she does not report abdominal pain.  Follow.  On protonix.

## 2018-12-05 NOTE — Assessment & Plan Note (Signed)
Physical today 11/30/18.  Schedule diagnostic mammogram as outlined.  Colonoscopy 06/30/17 - diverticulosis and non bleeding internal hemorrhoids.

## 2018-12-05 NOTE — Assessment & Plan Note (Signed)
CPAP.  

## 2018-12-05 NOTE — Assessment & Plan Note (Signed)
Diet and exercise.  Follow.  

## 2018-12-05 NOTE — Assessment & Plan Note (Signed)
Low cholesterol diet and exercise.  Follow lipid panel.  Declines cholesterol medication.   

## 2018-12-05 NOTE — Assessment & Plan Note (Signed)
Dry cough - as outlined.  Drainage.  Nasal spray.  Tessalon perles.  Check cxr.

## 2018-12-05 NOTE — Assessment & Plan Note (Signed)
Exam as outlined.  Fullness - 9:00 left breast.  Diagnostic mammogram and ultrasound.

## 2018-12-05 NOTE — Assessment & Plan Note (Signed)
Chest pain as outlined.  EKG - SR with no acute ischemic changes.  Schedule f/u with Dr Ubaldo Glassing to determine if further cardiac w/up warranted.

## 2018-12-05 NOTE — Assessment & Plan Note (Signed)
Followed by neurology.  On mestinon and MTX.   

## 2018-12-13 ENCOUNTER — Ambulatory Visit
Admission: RE | Admit: 2018-12-13 | Discharge: 2018-12-13 | Disposition: A | Discharge status: Home / Self Care | Payer: PPO | Source: Ambulatory Visit | Attending: Internal Medicine | Admitting: Internal Medicine

## 2018-12-13 DIAGNOSIS — N6489 Other specified disorders of breast: Secondary | ICD-10-CM | POA: Diagnosis not present

## 2018-12-13 DIAGNOSIS — R928 Other abnormal and inconclusive findings on diagnostic imaging of breast: Secondary | ICD-10-CM | POA: Diagnosis not present

## 2018-12-14 ENCOUNTER — Telehealth: Payer: Self-pay | Admitting: *Deleted

## 2018-12-14 ENCOUNTER — Other Ambulatory Visit: Payer: Self-pay

## 2018-12-14 ENCOUNTER — Other Ambulatory Visit (INDEPENDENT_AMBULATORY_CARE_PROVIDER_SITE_OTHER): Payer: PPO

## 2018-12-14 DIAGNOSIS — Z23 Encounter for immunization: Secondary | ICD-10-CM

## 2018-12-14 DIAGNOSIS — R739 Hyperglycemia, unspecified: Secondary | ICD-10-CM | POA: Diagnosis not present

## 2018-12-14 DIAGNOSIS — G7000 Myasthenia gravis without (acute) exacerbation: Secondary | ICD-10-CM

## 2018-12-14 DIAGNOSIS — I10 Essential (primary) hypertension: Secondary | ICD-10-CM | POA: Diagnosis not present

## 2018-12-14 DIAGNOSIS — E7800 Pure hypercholesterolemia, unspecified: Secondary | ICD-10-CM

## 2018-12-14 LAB — HEPATIC FUNCTION PANEL
ALT: 16 U/L (ref 0–35)
AST: 20 U/L (ref 0–37)
Albumin: 4 g/dL (ref 3.5–5.2)
Alkaline Phosphatase: 59 U/L (ref 39–117)
Bilirubin, Direct: 0.1 mg/dL (ref 0.0–0.3)
Total Bilirubin: 0.6 mg/dL (ref 0.2–1.2)
Total Protein: 6.1 g/dL (ref 6.0–8.3)

## 2018-12-14 LAB — CBC WITH DIFFERENTIAL/PLATELET
Basophils Absolute: 0.1 10*3/uL (ref 0.0–0.1)
Basophils Relative: 1.1 % (ref 0.0–3.0)
Eosinophils Absolute: 0.1 10*3/uL (ref 0.0–0.7)
Eosinophils Relative: 1.2 % (ref 0.0–5.0)
HCT: 44.2 % (ref 36.0–46.0)
Hemoglobin: 14.6 g/dL (ref 12.0–15.0)
Lymphocytes Relative: 20.9 % (ref 12.0–46.0)
Lymphs Abs: 1.7 10*3/uL (ref 0.7–4.0)
MCHC: 33.1 g/dL (ref 30.0–36.0)
MCV: 95.5 fl (ref 78.0–100.0)
Monocytes Absolute: 0.5 10*3/uL (ref 0.1–1.0)
Monocytes Relative: 5.6 % (ref 3.0–12.0)
Neutro Abs: 5.9 10*3/uL (ref 1.4–7.7)
Neutrophils Relative %: 71.2 % (ref 43.0–77.0)
Platelets: 194 10*3/uL (ref 150.0–400.0)
RBC: 4.62 Mil/uL (ref 3.87–5.11)
RDW: 16.6 % — ABNORMAL HIGH (ref 11.5–15.5)
WBC: 8.3 10*3/uL (ref 4.0–10.5)

## 2018-12-14 LAB — LIPID PANEL
Cholesterol: 197 mg/dL (ref 0–200)
HDL: 36.8 mg/dL — ABNORMAL LOW (ref >39.00)
NonHDL: 160.46
Total CHOL/HDL Ratio: 5
Triglycerides: 210 mg/dL — ABNORMAL HIGH (ref 0.0–149.0)
VLDL: 42 mg/dL — ABNORMAL HIGH (ref 0.0–40.0)

## 2018-12-14 LAB — BASIC METABOLIC PANEL
BUN: 14 mg/dL (ref 6–23)
CO2: 30 mEq/L (ref 19–32)
Calcium: 9 mg/dL (ref 8.4–10.5)
Chloride: 105 mEq/L (ref 96–112)
Creatinine, Ser: 0.78 mg/dL (ref 0.40–1.20)
GFR: 73.55 mL/min (ref >60.00)
Glucose, Bld: 120 mg/dL — ABNORMAL HIGH (ref 70–99)
Potassium: 3.6 mEq/L (ref 3.5–5.1)
Sodium: 141 mEq/L (ref 135–145)

## 2018-12-14 LAB — HEMOGLOBIN A1C: Hgb A1c MFr Bld: 5.4 % (ref 4.6–6.5)

## 2018-12-14 LAB — LDL CHOLESTEROL, DIRECT: Direct LDL: 122 mg/dL

## 2018-12-14 NOTE — Telephone Encounter (Signed)
Copied from [place] [id number]. Topic: General - Other >> Dec 14, 2018 10:54 AM Burchel, Madison Burns wrote: Please fax pt's most recent EKG to Dr Ubaldo Glassing.  Pt has appt on 12/23/2018.  Dr Ubaldo Glassing Fax: [phone]

## 2018-12-14 NOTE — Telephone Encounter (Signed)
Faxed to Dr Ubaldo Glassing

## 2018-12-23 DIAGNOSIS — G4733 Obstructive sleep apnea (adult) (pediatric): Secondary | ICD-10-CM | POA: Diagnosis not present

## 2018-12-23 DIAGNOSIS — R079 Chest pain, unspecified: Secondary | ICD-10-CM | POA: Diagnosis not present

## 2018-12-23 DIAGNOSIS — E7800 Pure hypercholesterolemia, unspecified: Secondary | ICD-10-CM | POA: Diagnosis not present

## 2018-12-23 DIAGNOSIS — I10 Essential (primary) hypertension: Secondary | ICD-10-CM | POA: Diagnosis not present

## 2019-01-01 ENCOUNTER — Emergency Department: Payer: PPO

## 2019-01-01 ENCOUNTER — Encounter: Payer: Self-pay | Admitting: Emergency Medicine

## 2019-01-01 ENCOUNTER — Emergency Department
Admission: EM | Admit: 2019-01-01 | Discharge: 2019-01-01 | Disposition: A | Discharge status: Home / Self Care | Payer: PPO | Attending: Emergency Medicine | Admitting: Emergency Medicine

## 2019-01-01 ENCOUNTER — Other Ambulatory Visit: Payer: Self-pay

## 2019-01-01 DIAGNOSIS — R079 Chest pain, unspecified: Secondary | ICD-10-CM | POA: Diagnosis not present

## 2019-01-01 DIAGNOSIS — R0789 Other chest pain: Secondary | ICD-10-CM

## 2019-01-01 DIAGNOSIS — Z79899 Other long term (current) drug therapy: Secondary | ICD-10-CM | POA: Insufficient documentation

## 2019-01-01 DIAGNOSIS — I10 Essential (primary) hypertension: Secondary | ICD-10-CM | POA: Diagnosis not present

## 2019-01-01 LAB — BASIC METABOLIC PANEL
Anion gap: 10 (ref 5–15)
BUN: 13 mg/dL (ref 8–23)
CO2: 25 mmol/L (ref 22–32)
Calcium: 8.9 mg/dL (ref 8.9–10.3)
Chloride: 106 mmol/L (ref 98–111)
Creatinine, Ser: 0.94 mg/dL (ref 0.44–1.00)
GFR calc Af Amer: >60 mL/min (ref >60)
GFR calc non Af Amer: >60 mL/min (ref >60)
Glucose, Bld: 115 mg/dL — ABNORMAL HIGH (ref 70–99)
Potassium: 4 mmol/L (ref 3.5–5.1)
Sodium: 141 mmol/L (ref 135–145)

## 2019-01-01 LAB — CBC
HCT: 43.1 % (ref 36.0–46.0)
Hemoglobin: 14.6 g/dL (ref 12.0–15.0)
MCH: 31.1 pg (ref 26.0–34.0)
MCHC: 33.9 g/dL (ref 30.0–36.0)
MCV: 91.9 fL (ref 80.0–100.0)
Platelets: 174 10*3/uL (ref 150–400)
RBC: 4.69 MIL/uL (ref 3.87–5.11)
RDW: 15.4 % (ref 11.5–15.5)
WBC: 10.3 10*3/uL (ref 4.0–10.5)
nRBC: 0 % (ref 0.0–0.2)

## 2019-01-01 LAB — TROPONIN I (HIGH SENSITIVITY)
Troponin I (High Sensitivity): 4 ng/L (ref <18)
Troponin I (High Sensitivity): 4 ng/L (ref <18)

## 2019-01-01 NOTE — ED Provider Notes (Signed)
[facility] Emergency Department Provider Note ____________________________________________   First MD Initiated Contact with Patient 01/01/19 2115     (approximate)  I have reviewed the triage vital signs and the nursing notes.   HISTORY  Chief Complaint Chest Pain    HPI Madison Burns is a 68 y.o. female with PMH as noted below but no prior CAD history who presents with chest pain, acute onset this morning, right-sided, described as an ache.  It is worse when she touches it although not affected by moving the arm.  She denies associated lightheadedness, nausea, cough, or shortness of breath.  She has had no acute leg pain or swelling.  Because the patient has a family history of heart disease, she and her sister decided to come have it checked.  Past Medical History:  Diagnosis Date  . GERD (gastroesophageal reflux disease)   . History of seizure disorder   . Hypercholesterolemia   . Hypertension   . Irritable bowel syndrome   . Myasthenia gravis ([place])   . OSA on CPAP     Patient Active Problem List   Diagnosis Date Noted  . Chest pain 12/05/2018  . Fullness of breast 11/30/2018  . History of seizure disorder 01/05/2018  . Gastroesophagitis 07/02/2017  . Cough 04/25/2017  . Epilepsy, generalized, convulsive ([place]) 02/23/2017  . OSA (obstructive sleep apnea) 02/23/2017  . Mood disorder ([place]) 10/01/2016  . Pain in right toe(s) 06/17/2015  . Myasthenia gravis in remission ([place]) 06/06/2015  . OSA on CPAP 06/06/2015  . Dysuria 02/12/2015  . Health care maintenance 06/12/2014  . Obstructive sleep apnea syndrome 05/03/2014  . MG with exacerbation (myasthenia gravis) ([place]) 04/10/2014  . Hypersomnia with sleep apnea 04/10/2014  . Obesity (BMI 30-39.9) 01/29/2014  . Hyperglycemia 01/29/2014  . Abdominal pain 01/29/2014  . Melanoma ([place]) 01/23/2013  . Hypercholesterolemia 12/21/2011  . Hypertension 12/21/2011  . Myasthenia gravis ([place])  12/21/2011  . Seizure disorder ([place]) 12/21/2011  . Irritable bowel 12/21/2011    Past Surgical History:  Procedure Laterality Date  . ABDOMINAL HYSTERECTOMY    . APPENDECTOMY  1999  . CHOLECYSTECTOMY  2013  . COLONOSCOPY WITH PROPOFOL N/A 06/30/2017   Procedure: COLONOSCOPY WITH PROPOFOL;  Surgeon: Toledo, Benay Pike, MD;  Location: [facility] ENDOSCOPY;  Service: Gastroenterology;  Laterality: N/A;  . ESOPHAGOGASTRODUODENOSCOPY (EGD) WITH PROPOFOL N/A 06/30/2017   Procedure: ESOPHAGOGASTRODUODENOSCOPY (EGD) WITH PROPOFOL;  Surgeon: Toledo, Benay Pike, MD;  Location: [facility] ENDOSCOPY;  Service: Gastroenterology;  Laterality: N/A;  . TONSILLECTOMY      Prior to Admission medications   Medication Sig Start Date End Date Taking? Authorizing Provider  benzonatate (TESSALON) 100 MG capsule Take 1 capsule (100 mg total) by mouth 2 (two) times daily as needed for cough. 11/30/18   Einar Pheasant, MD  levothyroxine (SYNTHROID) 75 MCG tablet TAKE 1 TABLET BY MOUTH ONCE DAILY ON AN EMPTY STOMACH. WAIT 30 MINUTES BEFORE TAKING OTHER MEDS. 11/25/18   Einar Pheasant, MD  methotrexate (RHEUMATREX) 2.5 MG tablet Take 4 tablets (10 mg total) by mouth once a week. Caution: Protect from light. 11/03/18   Lomax, Amy, NP  pantoprazole (PROTONIX) 40 MG tablet 40 mg daily. 11/24/17   [provider]  pyridostigmine (MESTINON) 60 MG tablet TAKE 1 TABLET BY MOUTH AT  8 AM, ONE AT LUNCH AND ONE AT 8PM 11/03/18   Lomax, Amy, NP  risperiDONE (RISPERDAL) 2 MG tablet  07/08/18   [provider]  sucralfate (CARAFATE) 1 g tablet Take  1 g by mouth 2 (two) times daily. 05/25/17   [provider]  tolterodine (DETROL LA) 4 MG 24 hr capsule TAKE 1 CAPSULE BY MOUTH ONCE EVERY MORNING 10/05/18   Einar Pheasant, MD    Allergies Ciprofloxacin and Levofloxacin  Family History  Problem Relation Age of Onset  . CVA Maternal Grandfather   . Colon cancer Maternal Grandfather        stomach/colon  . Heart disease  Father        myocardial infarction  . Hyperlipidemia Father   . Hyperlipidemia Sister   . Bone cancer Other        cousin  . Alzheimer's disease Mother        maternal aunts  . Myasthenia gravis Brother        ocular  . Breast cancer Neg Hx     Social History Social History   Tobacco Use  . Smoking status: Never Smoker  . Smokeless tobacco: Never Used  Substance Use Topics  . Alcohol use: No    Alcohol/week: 0.0 standard drinks  . Drug use: No    Review of Systems  Constitutional: No fever. Eyes: No redness. ENT: No sore throat. Cardiovascular: Positive for chest pain. Respiratory: Denies shortness of breath. Gastrointestinal: No vomiting or diarrhea.  Genitourinary: Negative for flank pain.  Musculoskeletal: Negative for back pain. Skin: Negative for rash. Neurological: Negative for headache.   ____________________________________________   PHYSICAL EXAM:  VITAL SIGNS: ED Triage Vitals  Enc Vitals Group     BP 01/01/19 1715 (!) 155/79     Pulse Rate 01/01/19 1715 76     Resp 01/01/19 1715 16     Temp 01/01/19 1715 98.8 F (37.1 C)     Temp Source 01/01/19 1715 Oral     SpO2 01/01/19 1715 97 %     Weight 01/01/19 1717 204 lb (92.5 kg)     Height 01/01/19 1717 5\' 3"  (1.6 m)     Head Circumference --      Peak Flow --      Pain Score --      Pain Loc --      Pain Edu? --      Excl. in [place]? --     Constitutional: Alert and oriented. Well appearing and in no acute distress. Eyes: Conjunctivae are normal.  Head: Atraumatic. Nose: No congestion/rhinnorhea. Mouth/Throat: Mucous membranes are moist.   Neck: Normal range of motion.  Cardiovascular: Normal rate, regular rhythm. Good peripheral circulation.  Reproducible right chest wall mild tenderness corresponding to the patient's pain. Respiratory: Normal respiratory effort.  No retractions.  Gastrointestinal: No distention.  Musculoskeletal: No lower extremity edema.  Extremities warm and well  perfused.  Neurologic:  Normal speech and language. No [place] focal neurologic deficits are appreciated.  Skin:  Skin is warm and dry. No rash noted. Psychiatric: Mood and affect are normal. Speech and behavior are normal.  ____________________________________________   LABS (all labs ordered are listed, but only abnormal results are displayed)  Labs Reviewed  BASIC METABOLIC PANEL - Abnormal; Notable for the following components:      Result Value   Glucose, Bld 115 (*)    All other components within normal limits  CBC  TROPONIN I (HIGH SENSITIVITY)  TROPONIN I (HIGH SENSITIVITY)   ____________________________________________  EKG  ED ECG REPORT I, Arta Silence, the attending physician, personally viewed and interpreted this ECG.  Date: 01/01/2019 EKG Time: 1723 Rate: 74 Rhythm: normal sinus rhythm QRS Axis:  normal Intervals: normal ST/T Wave abnormalities: normal Narrative Interpretation: no evidence of acute ischemia  ____________________________________________  RADIOLOGY  CXR: No acute abnormality  ____________________________________________   PROCEDURES  Procedure(s) performed: No  Procedures  Critical Care performed: No ____________________________________________   INITIAL IMPRESSION / ASSESSMENT AND PLAN / ED COURSE  Pertinent labs & imaging results that were available during my care of the patient were reviewed by me and considered in my medical decision making (see chart for details).  68 year old female with PMH as noted above presents with atypical and reproducible right-sided chest pain over the course the day today, with no associated nausea, lightheadedness, or shortness of breath.  It is not exertional.  On exam, the patient is very well-appearing.  Her vital signs are normal except for mild hypertension.  The physical exam is unremarkable except that I can reproduce the pain with palpation of the right chest wall.  Her EKG is  nonischemic.  I reviewed the past medical records in [place].  The patient actually just saw Dr. Ubaldo Glassing from cardiology earlier this month for an initial visit after an earlier episode of chest pain.  She has no active heart disease.  Overall presentation is consistent with musculoskeletal chest wall pain or other benign etiology.  Lab work-up was obtained from triage including troponins x2, both which were negative.  The patient reports a mild ache at this time but denies any significant active pain.  She feels comfortable and would like to go home.  Her sister who is with her feels comfortable with this.  I counseled them on the results of the work-up.  At this time, there is no indication for further ED work-up and admission.  I instructed them to have her follow-up with her PMD and with Dr. Ubaldo Glassing as planned.  Return precautions given, and they expressed understanding. ____________________________________________   FINAL CLINICAL IMPRESSION(S) / ED DIAGNOSES  Final diagnoses:  Atypical chest pain      NEW MEDICATIONS STARTED DURING THIS VISIT:  Discharge Medication List as of 01/01/2019  9:33 PM       Note:  This document was prepared using Dragon voice recognition software and may include unintentional dictation errors.   Arta Silence, MD 01/01/19 2206

## 2019-01-01 NOTE — ED Notes (Addendum)
Advised family member who is sitting with the patient in the lobby there is one other patient ahead of her to be roomed. VSS at this time. Repeat troponin sent.

## 2019-01-01 NOTE — Discharge Instructions (Addendum)
Return to the ER for new, worsening, or persistent severe chest pain, difficulty breathing, weakness or lightheadedness, or any other new or worsening symptoms that concern you. 

## 2019-01-01 NOTE — ED Triage Notes (Signed)
Patient presents to the ED with "achy" chest pain to the right side of her chest.  Area does not radiate and area is tender per patient.  Patient is in no obvious distress at this time.  Patient denies nausea/vomiting/diarrhea.  Patient denies shortness of breath.

## 2019-01-03 DIAGNOSIS — G4700 Insomnia, unspecified: Secondary | ICD-10-CM | POA: Diagnosis not present

## 2019-01-03 DIAGNOSIS — F39 Unspecified mood [affective] disorder: Secondary | ICD-10-CM | POA: Diagnosis not present

## 2019-01-03 DIAGNOSIS — F79 Unspecified intellectual disabilities: Secondary | ICD-10-CM | POA: Diagnosis not present

## 2019-01-07 ENCOUNTER — Other Ambulatory Visit: Payer: Self-pay | Admitting: Internal Medicine

## 2019-01-17 DIAGNOSIS — G4733 Obstructive sleep apnea (adult) (pediatric): Secondary | ICD-10-CM | POA: Diagnosis not present

## 2019-01-31 ENCOUNTER — Other Ambulatory Visit: Payer: Self-pay | Admitting: Gastroenterology

## 2019-01-31 DIAGNOSIS — R1033 Periumbilical pain: Secondary | ICD-10-CM

## 2019-02-07 ENCOUNTER — Ambulatory Visit
Admission: RE | Admit: 2019-02-07 | Discharge: 2019-02-07 | Disposition: A | Discharge status: Home / Self Care | Payer: PPO | Source: Ambulatory Visit | Attending: Gastroenterology | Admitting: Gastroenterology

## 2019-02-07 ENCOUNTER — Other Ambulatory Visit: Payer: Self-pay

## 2019-02-07 DIAGNOSIS — R1033 Periumbilical pain: Secondary | ICD-10-CM | POA: Diagnosis not present

## 2019-02-07 DIAGNOSIS — K5730 Diverticulosis of large intestine without perforation or abscess without bleeding: Secondary | ICD-10-CM | POA: Diagnosis not present

## 2019-02-07 MED ORDER — IOHEXOL 300 MG/ML  SOLN
100.0000 mL | Freq: Once | INTRAMUSCULAR | Status: AC | PRN
  Administered 2019-02-07: 10:00:00 100 mL via INTRAVENOUS

## 2019-02-08 ENCOUNTER — Other Ambulatory Visit: Payer: Self-pay | Admitting: Gastroenterology

## 2019-02-08 DIAGNOSIS — N2889 Other specified disorders of kidney and ureter: Secondary | ICD-10-CM

## 2019-02-08 DIAGNOSIS — R1033 Periumbilical pain: Secondary | ICD-10-CM | POA: Diagnosis not present

## 2019-02-18 DIAGNOSIS — C649 Malignant neoplasm of unspecified kidney, except renal pelvis: Secondary | ICD-10-CM

## 2019-02-18 HISTORY — DX: Malignant neoplasm of unspecified kidney, except renal pelvis: C64.9

## 2019-02-21 ENCOUNTER — Other Ambulatory Visit: Payer: Self-pay | Admitting: Gastroenterology

## 2019-02-21 ENCOUNTER — Other Ambulatory Visit: Payer: Self-pay

## 2019-02-21 ENCOUNTER — Ambulatory Visit
Admission: RE | Admit: 2019-02-21 | Discharge: 2019-02-21 | Disposition: A | Discharge status: Home / Self Care | Payer: PPO | Source: Ambulatory Visit | Attending: Gastroenterology | Admitting: Gastroenterology

## 2019-02-21 DIAGNOSIS — N2889 Other specified disorders of kidney and ureter: Secondary | ICD-10-CM | POA: Diagnosis not present

## 2019-02-21 MED ORDER — GADOBUTROL 1 MMOL/ML IV SOLN
9.0000 mL | Freq: Once | INTRAVENOUS | Status: AC | PRN
  Administered 2019-02-21: 18:00:00 9 mL via INTRAVENOUS

## 2019-02-22 ENCOUNTER — Other Ambulatory Visit: Payer: Self-pay | Admitting: Gastroenterology

## 2019-02-22 DIAGNOSIS — J9859 Other diseases of mediastinum, not elsewhere classified: Secondary | ICD-10-CM

## 2019-03-07 ENCOUNTER — Ambulatory Visit
Admission: RE | Admit: 2019-03-07 | Discharge: 2019-03-07 | Disposition: A | Discharge status: Home / Self Care | Payer: PPO | Source: Ambulatory Visit | Attending: Gastroenterology | Admitting: Gastroenterology

## 2019-03-07 ENCOUNTER — Telehealth: Payer: Self-pay | Admitting: Internal Medicine

## 2019-03-07 ENCOUNTER — Other Ambulatory Visit: Payer: Self-pay

## 2019-03-07 DIAGNOSIS — J9859 Other diseases of mediastinum, not elsewhere classified: Secondary | ICD-10-CM | POA: Diagnosis not present

## 2019-03-07 DIAGNOSIS — R911 Solitary pulmonary nodule: Secondary | ICD-10-CM | POA: Diagnosis not present

## 2019-03-07 LAB — POCT I-STAT CREATININE: Creatinine, Ser: 0.9 mg/dL (ref 0.44–1.00)

## 2019-03-07 MED ORDER — IOHEXOL 300 MG/ML  SOLN
75.0000 mL | Freq: Once | INTRAMUSCULAR | Status: AC | PRN
  Administered 2019-03-07: 75 mL via INTRAVENOUS

## 2019-03-07 NOTE — Telephone Encounter (Signed)
Madison November NP office called [phone] in regards to test results for pt  Please call back

## 2019-03-07 NOTE — Telephone Encounter (Signed)
Called and spoke to [place].  Madison Burns has f/u with Dr Genevive Bi this week regarding abnormal scans.

## 2019-03-09 ENCOUNTER — Other Ambulatory Visit: Payer: Self-pay

## 2019-03-09 ENCOUNTER — Encounter: Payer: Self-pay | Admitting: Neurology

## 2019-03-09 ENCOUNTER — Ambulatory Visit: Payer: PPO | Admitting: Neurology

## 2019-03-09 VITALS — BP 145/91 | HR 93 | Temp 96.9°F | Ht 63.0 in | Wt 205.0 lb

## 2019-03-09 DIAGNOSIS — C37 Malignant neoplasm of thymus: Secondary | ICD-10-CM

## 2019-03-09 DIAGNOSIS — C642 Malignant neoplasm of left kidney, except renal pelvis: Secondary | ICD-10-CM | POA: Diagnosis not present

## 2019-03-09 DIAGNOSIS — G7000 Myasthenia gravis without (acute) exacerbation: Secondary | ICD-10-CM | POA: Diagnosis not present

## 2019-03-09 DIAGNOSIS — D4989 Neoplasm of unspecified behavior of other specified sites: Secondary | ICD-10-CM | POA: Insufficient documentation

## 2019-03-09 DIAGNOSIS — C439 Malignant melanoma of skin, unspecified: Secondary | ICD-10-CM

## 2019-03-09 DIAGNOSIS — F39 Unspecified mood [affective] disorder: Secondary | ICD-10-CM

## 2019-03-09 DIAGNOSIS — G40309 Generalized idiopathic epilepsy and epileptic syndromes, not intractable, without status epilepticus: Secondary | ICD-10-CM

## 2019-03-09 MED ORDER — FOLIC ACID 1 MG PO TABS: 2.0000 mg | ORAL_TABLET | Freq: Every day | ORAL | 3 refills | Status: DC

## 2019-03-09 NOTE — Progress Notes (Signed)
PATIENT: Madison Burns DOB: 1950-06-16  REASON FOR VISIT: follow up HISTORY FROM: patient  Chief Complaint  Patient presents with  . Follow-up    pt with sister, rm 19. following up. advised recently diagnosed with kidney cancer and has a mass on aorta     HISTORY OF PRESENT ILLNESS: Today 03/09/19 KAILENE STOOKSBURY is a 69 y.o. female with mental retardation, living with her sister. Petrea has been on medication for myasthenia gravis, she had lost some weight since eating mostly at home during the pandemic.  She still has a good appetite no trouble swallowing she had no falls and no changes in gait.  No taking any seizure medicine, she has continued Mestinon and methotrexate for myasthenia gravis and it was felt that she was fairly stable.   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 This would be her third tumor.   IMPRESSION: 1. Apparent subtle 3.2 cm homogeneously hypoenhancing lesion in the posterior interpolar left kidney. Imaging features raise concern for papillary type renal cell carcinoma. Abdominal MRI with and without contrast recommended to further evaluate. 2. Bilateral adrenal nodules. These could also be further assessed at the time of follow-up MRI. 3. No specific findings to explain the patient's history of periumbilical pain. 4. Tiny gas bubble identified in the urinary bladder, presumably from recent instrumentation. In the absence of instrumentation, infection would be a consideration. 5. Insert [name] 6. Left colonic diverticulosis without diverticulitis.  She has been followed here by me for OSA- for obstructive sleep apnea and has been a very compliant  CPAP user V visit for sleep apnea is not to do until September 2021 with [name].    [name], 11-03-2018 here today for follow up of MG, seizure, and OSA on CPAP. She is here today with her sister , [name] who aids in history. She continues mestinon and methotrexate for MG and feels symptoms are fairly stable. No vision changes, no double vision. No new or worsening weakness. She has lost some weight as they are eating at home more since pandemic. She has a good appetite. No trouble swallowing. No changes in gait or falls. She is able to perform ADL's. She lives with her sister. She has not taken seizure medication in many years with no seizure activity noted. No adverse effects or worsening symptoms with decreased dose of methotrexate.   Compliance report dated 10/03/2018 through 11/01/2018 reveals that she is using CPAP nightly for compliance 100%.  She is using CPAP greater than 4 hours every night.  Average usage is 9 hours and 57 minutes.  AHI is 1.9 on 5 cm of water.  There is no significant leak.  HISTORY: (copied from [employer] note on 01/05/2018)  HISTORY [name] a 69 y.o.femaleseen here as a referral from Dr. Essie Hart Myasthenia gravis, per special request of the patient's sister .  Mrs. Leidig carries a diagnosis of myasthenia gravis associated with difficulties swallowing, with a decreased level of energy a high degree of fatigue sometimes best interest in activities facial droop eyelid droop sleepiness snoring easy bruising, coughing, trouble swallowing she has  a mild hoarseness but not as significant dysphonia and incontinence.  Her family and the patient looking for a maintenance neurologist to keep her myasthenia under control. The patient has been diagnosed and treated by Dr Carlis Abbott in [place]- until his death.She has been followed by Dr. Melrose Nakayama at the [facility] clinic after following Dr. Paulla Dolly for 18 month .  The patient was diagnosed in 2011 by dr Carlis Abbott-   with myasthenia gravis. She was hospitalized with myasthnic crisis Jan 27 2010- through Januery 14th 2012. She was send to rehabilitation after that, by 03-2010. [name]  Her sister noticed a decline in cognitive skills at that time.  She has noted some core weakness- MG , no ptosis, no double vision.    She   is sleeping 12 hours easily per day, from 10 Pm to 8 AM. She has a droopy right eye.  She missed frequently the 3rd. Mestinon.  She has been followed by Dr. Melrose Nakayama at the [facility] clinic after following Dr. Paulla Dolly . Again, her first treatment had been prednisone and this was just weaned off by September 2015. The patient carries a diagnosis of hypercholesterolemia. She also has a past surgical history of appendectomy in 1999, hysterectomy in 2004 and the melanoma was removed in 2014 from the right side of the neck close to the TM joint.  Ms. Simison is single without children, Lives with her sister now used to live with her mother. She is MRDD, she left school in 7th grade, went to a special facility until her GED, [employer]. She used to work at a [employer] ,later in a vocational trade.   Ms. Mirenda is taking methotrexate to control them myasthenia she is also on Mestinon 60 mg 3 times a day she takes Detrol for urinary continence at 4 mg in the morning and she takes Risperdal 2 mg I mouth at bedtime. She is also treated for hypothyroidism with levothyroxine at 50 g daily. The methotrexate is given at 8 tablets once a week. Until last September 2015 the patient had been on prednisone. She has not had labs drawn for the last 5 month, while continuing on MTX.  4/19/17Today's visit is solely dedicated to the sleep disorder part of her neurological care Mrs. Sferrazza underwent a sleep apnea test as a home sleep test, the AHI was 20.7 the lowest desaturation was 61 with 309 minutes of desaturation. This of course a possibility that in a home sleep test the hypoxemia is overestimated or artifact  related but she does have a significant amount of obstructive sleep apnea as well. For this reason and because of her underlying condition of myasthenia gravis the patient was asked to undergo an attended CPAP titration. This took place on 04-19-14 the AHI was 0.3 on CPAP of only 5 cm water. Her SPO2 nadir rose to 91% no added oxygen was needed. I prescribed a ResMed air-fluid P 10 in medium size and an auto CPAP from 4 through 8 cm water. The patient states that she has not received CPAP machine that she had an outstanding bill was advanced home care which may be the reason.  She is on Medicare/ Medicaid and a sleep study cannot be older than 6 months to allow her to obtain a machine - so I will have to repeat her sleep study. Her Epworth sleepiness score remains high at 17 points, her depression score at 3.5, fatigue severity questionnaire at 38 points. MRDD - needs assistence with CPAP, certainly would need  a desensitization session with [facility] in [place].    UPDATE 10/19/2017CMMs. Swails, 69 year old female returns for follow-up with her mother. She has a history of obstructive sleep apnea on CPAP compliance report today is 100% for 30 days average usage 10 hours 6 minutes AHI is 1.1. No leaks. In addition she has myasthenia gravis and is currently on Mestinon and methotrexate. She needs refills. No recent falls. She has no facial weakness no trouble swallowing. She also has a history of seizure disorder but has not had seizures in many years and has been weaned off of her seizure medication. She returns for reevaluation  02/23/17 CDI have the pleasure of following Ms. Bradshaw for her obstructive sleep apnea,and she has again proven to be a highly compliant CPAP user. Her CPAP compliance is 93% with 9 hours and 9 minutes on average daily use. CPAP is set at only 5 cm water pressure and allows for reduction of the AHI to 0.6. No central apneas are emerging and she has very few air leaks. Her baseline  AHI in 02-2015 was 20.8/hr."She reports dreaming more- good dreams". She sleeps well. She has trouble with swallowing solid foods. In addition she has not had significant muscle weakness, no falls, but she does feel easily fatigued and sleepy. She endorsed the Epworth score today on 11 and the fatigue severity at 54 points. 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 She often cried during this task.   UPDATE 11/19/2019CM Ms. Felmlee, 69 year old female returns for follow-up with history of obstructive sleep apnea on CPAP.  She also has a history of myasthenia gravis and seizure disorder.  She is no longer on seizure medication and no seizures in many years.  CPAP compliance dated 12/05/2017-01/03/2018 shows compliance greater than 4 hours at 100%.  Set pressure 5 cm.  Average usage 10 hours 29 minutes.  No leak.  AHI 1.3.  She remains on Mestinon and methotrexate for her myasthenia gravis which has been stable.  She complains of intermittent ptosis of the right eye when fatigued.  This is not evident today.  She has no double vision.  Appetite is good and she sleeps well she returns for reevaluation   REVIEW OF SYSTEMS: Out of a complete 14 system review of symptoms, the patient complains only of the following symptoms, none and all other reviewed systems are negative.   Periumbilical pian, chest pain.  Headaches.   Newly discovered m thymoma, malignant (?). Newly discover  Mediastinal LN swelling and renal pole cancer , eft kidney.    Epworth sleepiness scale: 9 Fatigue severity scale: 36  How likely are you to doze in the following situations: 0 = not likely, 1 = slight chance, 2 = moderate chance, 3 = high chance  Sitting and Reading? Watching Television? Sitting inactive in a public place (theater or meeting)? Lying down in the afternoon when circumstances permit? Sitting and talking to someone? Sitting quietly after lunch without alcohol? In  a car, while stopped for a few minutes in traffic? As a passenger in a car for an hour without a break?  Total = 12/24    ALLERGIES: Allergies  Allergen Reactions  . Ciprofloxacin Other (See Comments)    Sister denies  . Levofloxacin Other (See Comments)    Sister denies    HOME MEDICATIONS: Outpatient Medications Prior to Visit  Medication Sig Dispense Refill  . benzonatate (TESSALON) 100 MG capsule Take 1 capsule (100 mg total)  by mouth 2 (two) times daily as needed for cough. 21 capsule 0  . levothyroxine (SYNTHROID) 75 MCG tablet TAKE 1 TABLET BY MOUTH ONCE DAILY ON AN EMPTY STOMACH. WAIT 30 MINUTES BEFORE TAKING OTHER MEDS. 90 tablet 1  . methotrexate (RHEUMATREX) 2.5 MG tablet Take 4 tablets (10 mg total) by mouth once a week. Caution: Protect from light. 16 tablet 3  . pantoprazole (PROTONIX) 40 MG tablet 40 mg daily.    [name] pyridostigmine (MESTINON) 60 MG tablet TAKE 1 TABLET BY MOUTH AT  8 AM, ONE AT LUNCH AND ONE AT 8PM 90 tablet 6  . risperiDONE (RISPERDAL) 2 MG tablet     . sucralfate (CARAFATE) 1 g tablet Take 1 g by mouth 2 (two) times daily.    [name] tolterodine (DETROL LA) 4 MG 24 hr capsule TAKE 1 CAPSULE BY MOUTH ONCE EVERY MORNING 90 capsule 1   No facility-administered medications prior to visit.    PAST MEDICAL HISTORY: Past Medical History:  Diagnosis Date  . GERD (gastroesophageal reflux disease)   . History of seizure disorder   . Hypercholesterolemia   . Hypertension   . Irritable bowel syndrome   . Myasthenia gravis ([place])   . OSA on CPAP     PAST SURGICAL HISTORY: Past Surgical History:  Procedure Laterality Date  . ABDOMINAL HYSTERECTOMY    . APPENDECTOMY  1999  . CHOLECYSTECTOMY  2013  . COLONOSCOPY WITH PROPOFOL N/A 06/30/2017   Procedure: COLONOSCOPY WITH PROPOFOL;  Surgeon: Toledo, Benay Pike, MD;  Location: [facility] ENDOSCOPY;  Service: Gastroenterology;  Laterality: N/A;  . ESOPHAGOGASTRODUODENOSCOPY (EGD) WITH PROPOFOL N/A 06/30/2017    Procedure: ESOPHAGOGASTRODUODENOSCOPY (EGD) WITH PROPOFOL;  Surgeon: Toledo, Benay Pike, MD;  Location: [facility] ENDOSCOPY;  Service: Gastroenterology;  Laterality: N/A;  . TONSILLECTOMY      FAMILY HISTORY: Family History  Problem Relation Age of Onset  . CVA Maternal Grandfather   . Colon cancer Maternal Grandfather        stomach/colon  . Heart disease Father        myocardial infarction  . Hyperlipidemia Father   . Hyperlipidemia Sister   . Bone cancer Other        cousin  . Alzheimer's disease Mother        maternal aunts  . Myasthenia gravis Brother        ocular  . Breast cancer Neg Hx     SOCIAL HISTORY: Social History   Socioeconomic History  . Marital status: Single    Spouse name: Not on file  . Number of children: 0  . Years of education: Special Ed  . Highest education level: Not on file  Occupational History  . Occupation: Unemployed  Tobacco Use  . Smoking status: Never Smoker  . Smokeless tobacco: Never Used  Substance and Sexual Activity  . Alcohol use: No    Alcohol/week: 0.0 standard drinks  . Drug use: No  . Sexual activity: Not on file  Other Topics Concern  . Not on file  Social History Narrative   06/23/17 lives with sister, [name]   Regular exercise-no   Caffeine Use-yes   Social Determinants of Health   Financial Resource Strain:   . Difficulty of Paying Living Expenses: Not on file  Food Insecurity:   . Worried About Charity fundraiser in the Last Year: Not on file  . Ran Out of Food in the Last Year: Not on file  Transportation Needs:   . Lack of Transportation (Medical):  Not on file  . Lack of Transportation (Non-Medical): Not on file  Physical Activity:   . Days of Exercise per Week: Not on file  . Minutes of Exercise per Session: Not on file  Stress:   . Feeling of Stress : Not on file  Social Connections:   . Frequency of Communication with Friends and Family: Not on file  . Frequency of Social Gatherings with Friends and  Family: Not on file  . Attends Religious Services: Not on file  . Active Member of Clubs or Organizations: Not on file  . Attends Archivist Meetings: Not on file  . Marital Status: Not on file  Intimate Partner Violence:   . Fear of Current or Ex-Partner: Not on file  . Emotionally Abused: Not on file  . Physically Abused: Not on file  . Sexually Abused: Not on file      PHYSICAL EXAM  Vitals:   03/09/19 1439  BP: (!) 145/91  Pulse: 93  Temp: (!) 96.9 F (36.1 C)  Weight: 205 lb (93 kg)  Height: 5\' 3"  (1.6 m)   Body mass index is 36.31 kg/m.  Generalized: Well developed, in no acute distress . At the time her sister mentioned the cancer, she broke uot in tears, then calmed within 3 minutes- rapidly changing .   Neurological examination - alert  Mentation: Ms Grainer is developmentally delayed, unable to live alone, she is not fully oriented to place and date.  Cranial nerve : No change in taste or smell reported.  Pupils were equal round reactive to light. Extraocular movements were full,  Facial sensation and strength were normal. Uvula in midline. Head turning and shoulder shrug  were normal and symmetric. Motor: The motor testing reveals full strength of all 4 extremities.  Symmetric motor tone is noted throughout.  Sensory:  intact to soft touch on all 4 extremities. No evidence of extinction is noted.  Coordination: good finger-nose-fingerbilaterally.  Gait and station: Gait is normal. Tandem gait not attempted.  Romberg is negative. No drift is seen.  Reflexes: Deep tendon reflexes are symmetric and normal bilaterally.   DIAGNOSTIC DATA (LABS, IMAGING, TESTING) - I reviewed patient records, labs, notes, testing and imaging myself where available.  No flowsheet data found.   Lab Results  Component Value Date   WBC 10.3 01/01/2019   HGB 14.6 01/01/2019   HCT 43.1 01/01/2019   MCV 91.9 01/01/2019   PLT 174 01/01/2019      Component Value Date/Time     NA 141 01/01/2019 1728   NA 144 04/04/2015 1600   NA 137 02/21/2012 1828   K 4.0 01/01/2019 1728   K 4.3 02/21/2012 1828   CL 106 01/01/2019 1728   CL 104 02/21/2012 1828   CO2 25 01/01/2019 1728   CO2 25 02/21/2012 1828   GLUCOSE 115 (H) 01/01/2019 1728   GLUCOSE 145 (H) 02/21/2012 1828   BUN 13 01/01/2019 1728   BUN 8 04/04/2015 1600   BUN 8 02/21/2012 1828   CREATININE 0.90 03/07/2019 0757   CREATININE 1.14 02/21/2012 1828   CALCIUM 8.9 01/01/2019 1728   CALCIUM 9.0 02/21/2012 1828   PROT 6.1 12/14/2018 0857   PROT 6.0 04/04/2015 1600   PROT 6.4 04/25/2011 0854   ALBUMIN 4.0 12/14/2018 0857   ALBUMIN 4.0 04/04/2015 1600   ALBUMIN 3.4 04/25/2011 0854   AST 20 12/14/2018 0857   AST 18 04/25/2011 0854   ALT 16 12/14/2018 0857   ALT 25  04/25/2011 0854   ALKPHOS 59 12/14/2018 0857   ALKPHOS 45 (L) 04/25/2011 0854   BILITOT 0.6 12/14/2018 0857   BILITOT 0.4 04/04/2015 1600   BILITOT 0.4 04/25/2011 0854   GFRNONAA >60 01/01/2019 1728   GFRNONAA 52 (L) 02/21/2012 1828   GFRAA >60 01/01/2019 1728   GFRAA >60 02/21/2012 1828   Lab Results  Component Value Date   CHOL 197 12/14/2018   HDL 36.80 (L) 12/14/2018   LDLCALC 110 (H) 08/06/2018   LDLDIRECT 122.0 12/14/2018   TRIG 210.0 (H) 12/14/2018   CHOLHDL 5 12/14/2018   Lab Results  Component Value Date   HGBA1C 5.4 12/14/2018   No results found for: VITAMINB12 Lab Results  Component Value Date   TSH 2.07 03/30/2018   Staging, suspected renal cell carcinoma  EXAM: CT CHEST WITH CONTRAST  TECHNIQUE: Multidetector CT imaging of the chest was performed during intravenous contrast administration.  CONTRAST:  40mL OMNIPAQUE IOHEXOL 300 MG/ML  SOLN  COMPARISON:  None.  FINDINGS: Cardiovascular: No significant vascular findings. Normal heart size. No pericardial effusion.  Mediastinum/Nodes: No enlarged mediastinal, hilar, or axillary lymph nodes. There is a lobulated, internally calcified mass of  the anterior mediastinum which effaces the central portion of the left brachiocephalic vein and very closely abuts the tubular ascending aorta and measures approximately 5.8 x 4.9 x 4.1 cm (series 2, image 58, series 6, image 80). There appears to be adjacent pleural nodularity about the right upper lobe (series 2, image 39, series 5, image 55). Thyroid gland, trachea, and esophagus demonstrate no significant findings.  Lungs/Pleura: There is a 3 mm pulmonary nodule of the right upper lobe (series 3, image 68). 3 mm pulmonary nodule of the anterior right middle lobe (series 3, image 91). No pleural effusion or pneumothorax.  Upper Abdomen: No acute abnormality. Hepatic steatosis. Probable bilateral adrenal adenomas, better characterized by prior MRI.  Musculoskeletal: No chest wall mass or suspicious bone lesions identified.  IMPRESSION: 1. There is a lobulated, internally calcified mass of the anterior mediastinum which effaces the central portion of the left brachiocephalic vein and very closely abuts the tubular ascending aorta, measuring approximately 5.8 x 4.9 x 4.1 cm (series 2, image 58, series 6, image 80). 2. There appears to be adjacent pleural nodularity about the right upper lobe (series 2, image 39, series 5, image 55). 3. Findings are characteristic in location and appearance for thymoma, and apparent involvement of the left brachiocephalic vein and pleura are concerning for invasive thymic carcinoma. Although patient has a presumed renal cell carcinoma better evaluated by prior MRI, metastatic disease is much less favored given this appearance. 4. There is a 3 mm pulmonary nodule of the right upper lobe (series 3, image 68). 3 mm pulmonary nodule of the anterior right middle lobe (series 3, image 91). These nodules are nonspecific and metastatic disease is not excluded. Attention on follow-up. 5. Hepatic steatosis. 6. Probable bilateral adrenal adenomas,  better characterized by prior MRI.   Electronically Signed   By: Eddie Candle M.D.   On: 03/07/2019 08:34   ASSESSMENT AND PLAN 69 y.o. year old female  has a past medical history of GERD (gastroesophageal reflux disease), History of seizure disorder, Hypercholesterolemia, Hypertension, Irritable bowel syndrome, Myasthenia gravis ([place]), and OSA on CPAP. here with   Mercadies had been doing very well on Mestinon and methotrexate as prescribed.  We will continue Mestinon 60 mg 3 times daily as prescribed.  We will continue weaning Imitrex 8.  I  have recommended to continue 10 mg weekly (4 tablets of 2.5 mg) .   Her recently discovered left kidney tumour is 3.2 cm wide and will be evaluated by urological-surgery. She will see Urology in [place], will likely need biopsy of lymphnodes.Bilateral adrenal nodules. These could also be further assessed at the time of follow-up MRI-     If renal cancer is confirmed , needs further staging .   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 Will need a thoracic surgeon consult .   I spent 35 minutes with the patient. I will ask her to follow up in 6 month with NP.  [name], MD    03/09/2019, 2:57 PM [facility] [address], [place], [place] [address] [phone]

## 2019-03-18 ENCOUNTER — Other Ambulatory Visit: Payer: Self-pay

## 2019-03-18 ENCOUNTER — Ambulatory Visit: Payer: PPO | Admitting: Cardiothoracic Surgery

## 2019-03-18 ENCOUNTER — Encounter: Payer: Self-pay | Admitting: Cardiothoracic Surgery

## 2019-03-18 VITALS — BP 135/85 | HR 83 | Temp 97.3°F | Resp 14 | Ht 63.0 in | Wt 211.2 lb

## 2019-03-18 DIAGNOSIS — J9859 Other diseases of mediastinum, not elsewhere classified: Secondary | ICD-10-CM

## 2019-03-18 NOTE — Progress Notes (Signed)
Patient ID: Madison Burns, female   DOB: 1951-02-13, 69 y.o.   MRN: [id number]  Chief Complaint  Patient presents with  . New Patient (Initial Visit)    mediastinal mass    Referred By Dr. Einar Pheasant Reason for Referral mediastinal mass  HPI Location, Quality, Duration, Severity, Timing, Context, Modifying Factors, Associated Signs and Symptoms.  Madison Burns is a 69 y.o. female.  Madison Burns is accompanied today by her sister who provides almost all of the information during history.  She is a 69 year old woman who recently underwent a CT scan of her chest and abdomen to evaluate a left renal mass.  During the examination of the chest she was found to have an anterior mediastinal mass that is consistent with an invasive thymoma or thymic carcinoma.  Interestingly the patient carries a diagnosis of myasthenia gravis.  She was diagnosed approximately 10 years ago and has been treated with prednisone for prolonged period of time.  Recently she was able to get off the prednisone and is currently taking methotrexate and Mestinon for her symptoms.  She has never had a prior CT scan in our system although her sister feels that she might have had a CT scan when she was first diagnosed.   Past Medical History:  Diagnosis Date  . Cancer of kidney ([place]) 2021  . GERD (gastroesophageal reflux disease)   . History of seizure disorder   . Hypercholesterolemia   . Hypertension   . Irritable bowel syndrome   . Myasthenia gravis ([place]) 2011  . OSA on CPAP   . Skin cancer 2017   Melanoma on neck    Past Surgical History:  Procedure Laterality Date  . ABDOMINAL HYSTERECTOMY  1990  . APPENDECTOMY  1999  . CHOLECYSTECTOMY  2013  . COLONOSCOPY WITH PROPOFOL N/A 06/30/2017   Procedure: COLONOSCOPY WITH PROPOFOL;  Surgeon: Toledo, Benay Pike, MD;  Location: [facility];  Service: Gastroenterology;  Laterality: N/A;  . ESOPHAGOGASTRODUODENOSCOPY (EGD) WITH PROPOFOL N/A 06/30/2017   Procedure:  ESOPHAGOGASTRODUODENOSCOPY (EGD) WITH PROPOFOL;  Surgeon: Toledo, Benay Pike, MD;  Location: [facility];  Service: Gastroenterology;  Laterality: N/A;  . TONSILLECTOMY  1959    Family History  Problem Relation Age of Onset  . Colon cancer Maternal Grandfather        stomach/colon  . Heart disease Father        myocardial infarction  . Hyperlipidemia Father   . Hyperlipidemia Sister   . Diabetes Sister   . Bone cancer Other        cousin  . Alzheimer's disease Mother        maternal aunts  . Skin cancer Mother   . Myasthenia gravis Brother        ocular  . Skin cancer Brother   . Stroke Paternal Grandfather   . Breast cancer Neg Hx     Social History Social History   Tobacco Use  . Smoking status: Never Smoker  . Smokeless tobacco: Never Used  Substance Use Topics  . Alcohol use: No    Alcohol/week: 0.0 standard drinks  . Drug use: No    Allergies  Allergen Reactions  . Ciprofloxacin Other (See Comments)    Sister denies  . Levofloxacin Other (See Comments)    Sister denies    Current Outpatient Medications  Medication Sig Dispense Refill  . benzonatate (TESSALON) 100 MG capsule Take 1 capsule (100 mg total) by mouth 2 (two) times daily as needed for cough. 21 capsule  0  . folic acid (FOLVITE) 1 MG tablet Take 2 tablets (2 mg total) by mouth daily. 180 tablet 3  . levothyroxine (SYNTHROID) 75 MCG tablet TAKE 1 TABLET BY MOUTH ONCE DAILY ON AN EMPTY STOMACH. WAIT 30 MINUTES BEFORE TAKING OTHER MEDS. 90 tablet 1  . methotrexate (RHEUMATREX) 2.5 MG tablet Take 4 tablets (10 mg total) by mouth once a week. Caution: Protect from light. 16 tablet 3  . pantoprazole (PROTONIX) 40 MG tablet 40 mg daily.    [name] pyridostigmine (MESTINON) 60 MG tablet TAKE 1 TABLET BY MOUTH AT  8 AM, ONE AT LUNCH AND ONE AT 8PM 90 tablet 6  . risperiDONE (RISPERDAL) 2 MG tablet     . sucralfate (CARAFATE) 1 g tablet Take 1 g by mouth 2 (two) times daily.    [name] tolterodine (DETROL Burns) 4 MG 24  hr capsule TAKE 1 CAPSULE BY MOUTH ONCE EVERY MORNING 90 capsule 1   No current facility-administered medications for this visit.      Review of Systems A complete review of systems was asked and was negative except for the following positive findings easy bruising, abdominal pain, skin cancer.  Blood pressure 135/85, pulse 83, temperature (!) 97.3 F (36.3 C), temperature source Temporal, resp. rate 14, height 5\' 3"  (1.6 m), weight 211 lb 3.2 oz (95.8 kg), SpO2 97 %.  Physical Exam CONSTITUTIONAL:  Pleasant, well-developed, well-nourished, and in no acute distress. EYES: Pupils equal and reactive to light, Sclera non-icteric EARS, NOSE, MOUTH AND THROAT:  The oropharynx was clear.  Dentition is good repair.  Oral mucosa pink and moist. LYMPH NODES:  Lymph nodes in the neck and axillae were normal RESPIRATORY:  Lungs were clear.  Normal respiratory effort without pathologic use of accessory muscles of respiration CARDIOVASCULAR: Heart was regular without murmurs.  There were no carotid bruits. GI: The abdomen was soft, nontender, and nondistended. There were no palpable masses. There was no hepatosplenomegaly. There were normal bowel sounds in all quadrants. GU:  Rectal deferred.   MUSCULOSKELETAL:  Normal muscle strength and tone.  No clubbing or cyanosis.   SKIN:  There were no pathologic skin lesions.  There were no nodules on palpation. NEUROLOGIC:  Sensation is normal.  Cranial nerves are grossly intact. PSYCH:  Oriented to person, place and time.  Mood and affect are normal.  Data Reviewed CT scan  I have personally reviewed the patient's imaging, laboratory findings and medical records.    Assessment    I have independently reviewed her CT scan.  There is a large anterior mediastinal mass consistent with a invasive thymoma or thymic carcinoma.    Plan    I had a long discussion with the sister.  She believes that they would like to entertain the diagnosis so that  potential therapy may be given.  She will discuss this with her other siblings.  In the meantime we will set her up for a CT-guided needle biopsy of the anterior mediastinal mass.  I will see her back approximately 1 week after the biopsy.        Nestor Lewandowsky, MD 03/18/2019, 12:12 PM

## 2019-03-21 ENCOUNTER — Telehealth: Payer: Self-pay

## 2019-03-21 ENCOUNTER — Other Ambulatory Visit
Admission: RE | Admit: 2019-03-21 | Discharge: 2019-03-21 | Disposition: A | Discharge status: Home / Self Care | Payer: PPO | Source: Ambulatory Visit | Attending: Cardiothoracic Surgery | Admitting: Cardiothoracic Surgery

## 2019-03-21 DIAGNOSIS — Z20822 Contact with and (suspected) exposure to covid-19: Secondary | ICD-10-CM | POA: Diagnosis not present

## 2019-03-21 DIAGNOSIS — Z01812 Encounter for preprocedural laboratory examination: Secondary | ICD-10-CM | POA: Diagnosis not present

## 2019-03-21 NOTE — Telephone Encounter (Signed)
Spoke with patient's sister, [name], to let her know about the patient's scheduled CT Biopsy. She is scheduled for a CT guided lung biopsy at [facility] on 03/28/19 at 10:30 am. She will need to be there by 9:30 am. She will need to isolate until then as she had her Covid test today. She will be getting a call later this week from a nurse to go over instructions for her CT biopsy. The patient is aware of date, time, and instructions.

## 2019-03-22 ENCOUNTER — Other Ambulatory Visit: Payer: Self-pay | Admitting: Radiology

## 2019-03-22 ENCOUNTER — Other Ambulatory Visit: Payer: Self-pay

## 2019-03-22 ENCOUNTER — Ambulatory Visit: Payer: PPO | Admitting: Urology

## 2019-03-22 VITALS — BP 141/85 | HR 90 | Ht 63.0 in | Wt 210.0 lb

## 2019-03-22 DIAGNOSIS — N2889 Other specified disorders of kidney and ureter: Secondary | ICD-10-CM | POA: Diagnosis not present

## 2019-03-22 LAB — SARS CORONAVIRUS 2 (TAT 6-24 HRS): SARS Coronavirus 2: NEGATIVE

## 2019-03-22 NOTE — Progress Notes (Signed)
03/22/2019 4:54 PM   [name] [birth year] [id number]  Referring provider: Einar Pheasant, [id number],  [place] [address]  Chief Complaint  Patient presents with  . New Patient (Initial Visit)    renal mass    HPI: 69 year old female with a personal history of developmental delay, myasthenia gravis and a new mediastinal mass who presents today for further evaluation of incidental left renal mass.  Ms. Mandich initially underwent a CT abdomen pelvis with contrast on 03/09/2018 for worsening umbilical pain.  This revealed an incidental possible 3 cm renal mass on the left, however there was significant artifact appreciated secondary to motion.  This is followed up with an MRCP performed for the purpose of right upper quadrant pain as well as the renal mass in question.  MRI performed on 02/20/2018 showed a 3.2 x 2.6 left lower pole endophytic renal mass which was felt to possibly be enhancing and consistent with possible renal cell carcinoma.  She denies any flank pain or [place] hematuria.  No personal or family history of renal cell carcinoma.  No recent cross-sectional upper tract imaging for comparison.  Notably, she is being seen and evaluated right now by Dr. Faith Rogue for a new mediastinal mass which was picked up incidentally on MRI as well.  She is undergoing biopsy of this on Monday.  She is accompanied today by her sister who is her healthcare proxy and caretaker.  Ms. Jaynia Altier is extremely pleasant and brought me a picture today that she drew of her dog, Dasher, as a gift.   PMH: Past Medical History:  Diagnosis Date  . Cancer of kidney ([place]) 2021  . GERD (gastroesophageal reflux disease)   . History of seizure disorder   . Hypercholesterolemia   . Hypertension   . Irritable bowel syndrome   . Myasthenia gravis ([place]) 2011  . OSA on CPAP   . Skin cancer 2017   Melanoma on neck    Surgical History: Past Surgical History:    Procedure Laterality Date  . ABDOMINAL HYSTERECTOMY  1990  . APPENDECTOMY  1999  . CHOLECYSTECTOMY  2013  . COLONOSCOPY WITH PROPOFOL N/A 06/30/2017   Procedure: COLONOSCOPY WITH PROPOFOL;  Surgeon: Toledo, Benay Pike, MD;  Location: [facility];  Service: Gastroenterology;  Laterality: N/A;  . ESOPHAGOGASTRODUODENOSCOPY (EGD) WITH PROPOFOL N/A 06/30/2017   Procedure: ESOPHAGOGASTRODUODENOSCOPY (EGD) WITH PROPOFOL;  Surgeon: Toledo, Benay Pike, MD;  Location: [facility];  Service: Gastroenterology;  Laterality: N/A;  . TONSILLECTOMY  1959    Home Medications:  Allergies as of 03/22/2019      Reactions   Ciprofloxacin Other (See Comments)   Sister denies   Levofloxacin Other (See Comments)   Sister denies      Medication List       Accurate as of March 22, 2019  4:54 PM. If you have any questions, ask your nurse or doctor.        benzonatate 100 MG capsule Commonly known as: TESSALON Take 1 capsule (100 mg total) by mouth 2 (two) times daily as needed for cough.   folic acid 1 MG tablet Commonly known as: FOLVITE Take 2 tablets (2 mg total) by mouth daily.   levothyroxine 75 MCG tablet Commonly known as: SYNTHROID TAKE 1 TABLET BY MOUTH ONCE DAILY ON AN EMPTY STOMACH. WAIT 30 MINUTES BEFORE TAKING OTHER MEDS.   methotrexate 2.5 MG tablet Commonly known as: RHEUMATREX Take 4 tablets (10 mg total) by mouth once a week.  Caution: Protect from light.   pantoprazole 40 MG tablet Commonly known as: PROTONIX 40 mg daily.   pyridostigmine 60 MG tablet Commonly known as: MESTINON TAKE 1 TABLET BY MOUTH AT  8 AM, ONE AT LUNCH AND ONE AT 8PM   risperiDONE 2 MG tablet Commonly known as: RISPERDAL   sucralfate 1 g tablet Commonly known as: CARAFATE Take 1 g by mouth 2 (two) times daily.   tolterodine 4 MG 24 hr capsule Commonly known as: DETROL LA TAKE 1 CAPSULE BY MOUTH ONCE EVERY MORNING       Allergies:  Allergies  Allergen Reactions  . Ciprofloxacin Other  (See Comments)    Sister denies  . Levofloxacin Other (See Comments)    Sister denies    Family History: Family History  Problem Relation Age of Onset  . Colon cancer Maternal Grandfather        stomach/colon  . Heart disease Father        myocardial infarction  . Hyperlipidemia Father   . Hyperlipidemia Sister   . Diabetes Sister   . Bone cancer Other        cousin  . Alzheimer's disease Mother        maternal aunts  . Skin cancer Mother   . Myasthenia gravis Brother        ocular  . Skin cancer Brother   . Stroke Paternal Grandfather   . Breast cancer Neg Hx     Social History:  reports that she has never smoked. She has never used smokeless tobacco. She reports that she does not drink alcohol or use drugs.  ROS: UROLOGY Frequent Urination?: Yes Hard to postpone urination?: No Burning/pain with urination?: No Get up at night to urinate?: Yes Leakage of urine?: No Urine stream starts and stops?: No Trouble starting stream?: No Do you have to strain to urinate?: No Blood in urine?: No Urinary tract infection?: No Sexually transmitted disease?: No Injury to kidneys or bladder?: No Painful intercourse?: No Weak stream?: No Currently pregnant?: No Vaginal bleeding?: No Last menstrual period?: n/a  Gastrointestinal Nausea?: No Vomiting?: No Indigestion/heartburn?: No Diarrhea?: No Constipation?: No  Constitutional Fever: No Night sweats?: No Weight loss?: No Fatigue?: Yes  Skin Skin rash/lesions?: No Itching?: No  Eyes Blurred vision?: No Double vision?: No  Ears/Nose/Throat Sore throat?: No Sinus problems?: No  Hematologic/Lymphatic Swollen glands?: No Easy bruising?: Yes  Cardiovascular Leg swelling?: No Chest pain?: No  Respiratory Cough?: No Shortness of breath?: No  Endocrine Excessive thirst?: No  Musculoskeletal Back pain?: No Joint pain?: No  Neurological Headaches?: No Dizziness?: No  Psychologic Depression?:  No  Physical Exam: BP (!) 141/85 (BP Location: Left Arm, Patient Position: Sitting, Cuff Size: Large)   Pulse 90   Ht 5\' 3"  (1.6 m)   Wt 210 lb (95.3 kg)   BMI 37.20 kg/m   Constitutional:  Alert and oriented, No acute distress.  Answers questions appropriately. HEENT: [place] AT, moist mucus membranes.  Trachea midline, no masses. Cardiovascular: No clubbing, cyanosis, or edema. Respiratory: Normal respiratory effort, no increased work of breathing. Skin: No rashes, bruises or suspicious lesions. Neurologic: Grossly intact, no focal deficits, moving all 4 extremities. Psychiatric: Normal mood and affect.  Laboratory Data: Lab Results  Component Value Date   WBC 10.3 01/01/2019   HGB 14.6 01/01/2019   HCT 43.1 01/01/2019   MCV 91.9 01/01/2019   PLT 174 01/01/2019    Lab Results  Component Value Date   CREATININE 0.90 03/07/2019  Lab Results  Component Value Date   HGBA1C 5.4 12/14/2018     Pertinent Imaging: CT scan and MRCP from 12/20 and 03/09/2019 were all personally reviewed today.  Agree with radiologic interpretation.  Assessment & Plan:    1. Left renal mass Incidental 3.2 cm endophytic left lower pole renal lesion, imaging suboptimal due to motion artifact and modality  A solid renal mass raises the suspicion of primary renal malignancy.  We discussed this in detail and in regards to the spectrum of renal masses which includes cysts (pure cysts are considered benign), solid masses and everything in between. The risk of metastasis increases as the size of solid renal mass increases. In general, it is believed that the risk of metastasis for renal masses less than 3-4 cm is small (up to approximately 5%) based mainly on large retrospective studies.  In some cases and especially in patients of older age and multiple comorbidities a surveillance approach may be appropriate. The treatment of solid renal masses includes: surveillance, cryoablation (percutaneous and  laparoscopic) in addition to partial and complete nephrectomy (each with option of laparoscopic, robotic and open depending on appropriateness). Furthermore, nephrectomy appears to be an independent risk factor for the development of chronic kidney disease suggesting that nephron sparing approaches should be implored whenever feasible. We reviewed these options in context of the patients current situation as well as the pros and cons of each.  That she has had multiple masses/malignancy with a new large mediastinal mass, renal mass biopsy would be helpful both to ensure that this is new primary lesion as well as to assess whether or not the mass itself appears to be aggressive.  She scheduled for biopsy of her mediastinal mass on Monday for which she is already been Covid tested and scheduled.  Ideally, renal mass biopsy could be done at the same time.  We will plan to discuss her results at tumor board  Given her medical comorbidities, observation versus percutaneous intervention likely will be the most likely options.  We will discuss this further at her follow-up visit.  Both she and her sister agreeable this plan.    Return in about 4 weeks (around 04/19/2019) for f/u renal mass.  Hollice Espy, MD  [facility] [address] [place], [place] [address] [phone]

## 2019-03-24 ENCOUNTER — Other Ambulatory Visit: Payer: Self-pay | Admitting: Radiology

## 2019-03-24 ENCOUNTER — Other Ambulatory Visit: Admission: RE | Admit: 2019-03-24 | Payer: PPO | Source: Ambulatory Visit

## 2019-03-25 ENCOUNTER — Other Ambulatory Visit: Payer: Self-pay | Admitting: Student

## 2019-03-25 ENCOUNTER — Other Ambulatory Visit: Payer: Self-pay | Admitting: Radiology

## 2019-03-28 ENCOUNTER — Ambulatory Visit
Admission: RE | Admit: 2019-03-28 | Discharge: 2019-03-28 | Disposition: A | Discharge status: Home / Self Care | Payer: PPO | Source: Ambulatory Visit | Attending: Urology | Admitting: Urology

## 2019-03-28 ENCOUNTER — Ambulatory Visit
Admission: RE | Admit: 2019-03-28 | Discharge: 2019-03-28 | Disposition: A | Discharge status: Home / Self Care | Payer: PPO | Source: Ambulatory Visit | Attending: Cardiothoracic Surgery | Admitting: Cardiothoracic Surgery

## 2019-03-28 ENCOUNTER — Other Ambulatory Visit: Payer: Self-pay

## 2019-03-28 DIAGNOSIS — N2889 Other specified disorders of kidney and ureter: Secondary | ICD-10-CM | POA: Diagnosis not present

## 2019-03-28 DIAGNOSIS — C642 Malignant neoplasm of left kidney, except renal pelvis: Secondary | ICD-10-CM | POA: Diagnosis not present

## 2019-03-28 DIAGNOSIS — C37 Malignant neoplasm of thymus: Secondary | ICD-10-CM | POA: Diagnosis not present

## 2019-03-28 DIAGNOSIS — D384 Neoplasm of uncertain behavior of thymus: Secondary | ICD-10-CM | POA: Diagnosis not present

## 2019-03-28 DIAGNOSIS — J9859 Other diseases of mediastinum, not elsewhere classified: Secondary | ICD-10-CM

## 2019-03-28 DIAGNOSIS — D472 Monoclonal gammopathy: Secondary | ICD-10-CM | POA: Diagnosis not present

## 2019-03-28 LAB — CBC
HCT: 45.7 % (ref 36.0–46.0)
Hemoglobin: 15 g/dL (ref 12.0–15.0)
MCH: 31.3 pg (ref 26.0–34.0)
MCHC: 32.8 g/dL (ref 30.0–36.0)
MCV: 95.2 fL (ref 80.0–100.0)
Platelets: 167 10*3/uL (ref 150–400)
RBC: 4.8 MIL/uL (ref 3.87–5.11)
RDW: 15.3 % (ref 11.5–15.5)
WBC: 8.9 10*3/uL (ref 4.0–10.5)
nRBC: 0 % (ref 0.0–0.2)

## 2019-03-28 LAB — PROTIME-INR
INR: 0.9 (ref 0.8–1.2)
Prothrombin Time: 12.1 seconds (ref 11.4–15.2)

## 2019-03-28 MED ORDER — SODIUM CHLORIDE 0.9 % IV SOLN: INTRAVENOUS | Status: DC

## 2019-03-28 MED ORDER — FENTANYL CITRATE (PF) 100 MCG/2ML IJ SOLN
INTRAMUSCULAR | Status: AC
  Filled 2019-03-28: qty 4

## 2019-03-28 MED ORDER — HYDROCODONE-ACETAMINOPHEN 5-325 MG PO TABS
1.0000 | ORAL_TABLET | ORAL | Status: DC | PRN
  Filled 2019-03-28: qty 2

## 2019-03-28 MED ORDER — MIDAZOLAM HCL 2 MG/2ML IJ SOLN
INTRAMUSCULAR | Status: AC | PRN
  Administered 2019-03-28: 1 mg via INTRAVENOUS

## 2019-03-28 MED ORDER — FENTANYL CITRATE (PF) 100 MCG/2ML IJ SOLN
INTRAMUSCULAR | Status: AC | PRN
  Administered 2019-03-28: 50 ug via INTRAVENOUS

## 2019-03-28 MED ORDER — MIDAZOLAM HCL 2 MG/2ML IJ SOLN
INTRAMUSCULAR | Status: AC
  Filled 2019-03-28: qty 4

## 2019-03-28 NOTE — Discharge Instructions (Signed)
Moderate Conscious Sedation, Adult, Care After These instructions provide you with information about caring for yourself after your procedure. Your health care provider may also give you more specific instructions. Your treatment has been planned according to current medical practices, but problems sometimes occur. Call your health care provider if you have any problems or questions after your procedure. What can I expect after the procedure? After your procedure, it is common:  To feel sleepy for several hours.  To feel clumsy and have poor balance for several hours.  To have poor judgment for several hours.  To vomit if you eat too soon. Follow these instructions at home: For at least 24 hours after the procedure:   Do not: ? Participate in activities where you could fall or become injured. ? Drive. ? Use heavy machinery. ? Drink alcohol. ? Take sleeping pills or medicines that cause drowsiness. ? Make important decisions or sign legal documents. ? Take care of children on your own.  Rest. Eating and drinking  Follow the diet recommended by your health care provider.  If you vomit: ? Drink water, juice, or soup when you can drink without vomiting. ? Make sure you have little or no nausea before eating solid foods. General instructions  Have a responsible adult stay with you until you are awake and alert.  Take over-the-counter and prescription medicines only as told by your health care provider.  If you smoke, do not smoke without supervision.  Keep all follow-up visits as told by your health care provider. This is important. Contact a health care provider if:  You keep feeling nauseous or you keep vomiting.  You feel light-headed.  You develop a rash.  You have a fever. Get help right away if:  You have trouble breathing. This information is not intended to replace advice given to you by your health care provider. Make sure you discuss any questions you have  with your health care provider. Document Revised: 01/16/2017 Document Reviewed: 05/26/2015 Elsevier Patient Education  2020 Elsevier Inc. Needle Biopsy, Care After These instructions tell you how to care for yourself after your procedure. Your doctor may also give you more specific instructions. Call your doctor if you have any problems or questions. What can I expect after the procedure? After the procedure, it is common to have:  Soreness.  Bruising.  Mild pain. Follow these instructions at home:   Return to your normal activities as told by your doctor. Ask your doctor what activities are safe for you.  Take over-the-counter and prescription medicines only as told by your doctor.  Wash your hands with soap and water before you change your bandage (dressing). If you cannot use soap and water, use hand sanitizer.  Follow instructions from your doctor about: ? How to take care of your puncture site. ? When and how to change your bandage. ? When to remove your bandage.  Check your puncture site every day for signs of infection. Watch for: ? Redness, swelling, or pain. ? Fluid or blood. ? Pus or a bad smell. ? Warmth.  Do not take baths, swim, or use a hot tub until your doctor approves. Ask your doctor if you may take showers. You may only be allowed to take sponge baths.  Keep all follow-up visits as told by your doctor. This is important. Contact a doctor if you have:  A fever.  Redness, swelling, or pain at the puncture site, and it lasts longer than a few days.  Fluid,   blood, or pus coming from the puncture site.  Warmth coming from the puncture site. Get help right away if:  You have a lot of bleeding from the puncture site. Summary  After the procedure, it is common to have soreness, bruising, or mild pain at the puncture site.  Check your puncture site every day for signs of infection, such as redness, swelling, or pain.  Get help right away if you have  severe bleeding from your puncture site. This information is not intended to replace advice given to you by your health care provider. Make sure you discuss any questions you have with your health care provider. Document Revised: 02/16/2017 Document Reviewed: 02/16/2017 Elsevier Patient Education  2020 Elsevier Inc.  

## 2019-03-28 NOTE — Sedation Documentation (Signed)
Pt flipped over onto abdomen for renal biopsy

## 2019-03-28 NOTE — Procedures (Signed)
  Procedure: CT core biopsy anterior mediastinal mass   CT core biopsy LLP renal mass   EBL:   minimal Complications:  none immediate  See full dictation in [facility].  Dillard Cannon MD Main # [phone] Pager  [phone]

## 2019-03-30 ENCOUNTER — Other Ambulatory Visit: Payer: Self-pay | Admitting: Pathology

## 2019-03-30 LAB — SURGICAL PATHOLOGY

## 2019-03-30 NOTE — Progress Notes (Unsigned)
mdt  

## 2019-04-04 ENCOUNTER — Telehealth: Payer: Self-pay | Admitting: Neurology

## 2019-04-04 ENCOUNTER — Other Ambulatory Visit: Payer: Self-pay | Admitting: Neurology

## 2019-04-04 MED ORDER — METHOTREXATE 2.5 MG PO TABS: 10.0000 mg | ORAL_TABLET | ORAL | 3 refills | Status: DC

## 2019-04-04 NOTE — Telephone Encounter (Signed)
1) Medication(s) Requested (by name): methotrexate (RHEUMATREX) 2.5 MG tablet   2) Pharmacy of Choice: [facility], [address] [place] [id number]   3) Special Requests:   patient does not have anymore and missed her dosse last week

## 2019-04-04 NOTE — Telephone Encounter (Signed)
Refill has been sent for the pt per request.

## 2019-04-05 ENCOUNTER — Ambulatory Visit (INDEPENDENT_AMBULATORY_CARE_PROVIDER_SITE_OTHER): Payer: PPO | Admitting: Internal Medicine

## 2019-04-05 ENCOUNTER — Other Ambulatory Visit: Payer: Self-pay | Admitting: Pathology

## 2019-04-05 DIAGNOSIS — G4733 Obstructive sleep apnea (adult) (pediatric): Secondary | ICD-10-CM | POA: Diagnosis not present

## 2019-04-05 DIAGNOSIS — I10 Essential (primary) hypertension: Secondary | ICD-10-CM | POA: Diagnosis not present

## 2019-04-05 DIAGNOSIS — R739 Hyperglycemia, unspecified: Secondary | ICD-10-CM | POA: Diagnosis not present

## 2019-04-05 DIAGNOSIS — J9859 Other diseases of mediastinum, not elsewhere classified: Secondary | ICD-10-CM | POA: Diagnosis not present

## 2019-04-05 DIAGNOSIS — E7800 Pure hypercholesterolemia, unspecified: Secondary | ICD-10-CM

## 2019-04-05 DIAGNOSIS — C642 Malignant neoplasm of left kidney, except renal pelvis: Secondary | ICD-10-CM

## 2019-04-05 DIAGNOSIS — G7001 Myasthenia gravis with (acute) exacerbation: Secondary | ICD-10-CM | POA: Diagnosis not present

## 2019-04-05 MED ORDER — BENZONATATE 100 MG PO CAPS: 100.0000 mg | ORAL_CAPSULE | Freq: Two times a day (BID) | ORAL | 0 refills | Status: DC | PRN

## 2019-04-05 NOTE — Progress Notes (Signed)
Patient ID: Madison Burns, female   DOB: 13-Dec-1950, 69 y.o.   MRN: [id number]   Virtual Visit via video Note  This visit type was conducted due to national recommendations for restrictions regarding the COVID-19 pandemic (e.g. social distancing).  This format is felt to be most appropriate for this patient at this time.  All issues noted in this document were discussed and addressed.  No physical exam was performed (except for noted visual exam findings with Video Visits).   I connected with Madison Burns by a video enabled telemedicine application and verified that I am speaking with the correct person using two identifiers. Location patient: home Location provider: work  Persons participating in the virtual visit: patient, provider and pts sister Madison Burns  The limitations, risks, security and privacy concerns of performing an evaluation and management service by video and the availability of in person appointments have been discussed. The patient expressed understanding and agreed to proceed.   Reason for visit: scheduled follow up.   HPI: Recent chest and abdomen CT scan - found to have anterior mediastinal mass.  Seeing Dr Madison Burns - biopsy of mediastinal mass.  Also found to have kidney lesion - seeing urology.  Presumed to be RCC.  Information obtained from her sister Madison Burns.  Reports she is doing well.  No problems swallowing.  Eating well.  No nausea or vomiting.  No chest pain or sob.  Reports some minimal abdominal pain when asked.  Bowels stable.     ROS: See pertinent positives and negatives per HPI.  Past Medical History:  Diagnosis Date  . Cancer of kidney ([place]) 2021  . GERD (gastroesophageal reflux disease)   . History of seizure disorder   . Hypercholesterolemia   . Hypertension   . Irritable bowel syndrome   . Myasthenia gravis ([place]) 2011  . OSA on CPAP   . Skin cancer 2017   Melanoma on neck    Past Surgical History:  Procedure Laterality Date  . ABDOMINAL  HYSTERECTOMY  1990  . APPENDECTOMY  1999  . CHOLECYSTECTOMY  2013  . COLONOSCOPY WITH PROPOFOL N/A 06/30/2017   Procedure: COLONOSCOPY WITH PROPOFOL;  Surgeon: Madison Burns, Benay Pike, MD;  Location: [facility] ENDOSCOPY;  Service: Gastroenterology;  Laterality: N/A;  . ESOPHAGOGASTRODUODENOSCOPY (EGD) WITH PROPOFOL N/A 06/30/2017   Procedure: ESOPHAGOGASTRODUODENOSCOPY (EGD) WITH PROPOFOL;  Surgeon: Madison Burns, Benay Pike, MD;  Location: [facility] ENDOSCOPY;  Service: Gastroenterology;  Laterality: N/A;  . TONSILLECTOMY  1959    Family History  Problem Relation Age of Onset  . Colon cancer Maternal Grandfather        stomach/colon  . Heart disease Father        myocardial infarction  . Hyperlipidemia Father   . Hyperlipidemia Sister   . Diabetes Sister   . Bone cancer Other        cousin  . Alzheimer's disease Mother        maternal aunts  . Skin cancer Mother   . Myasthenia gravis Brother        ocular  . Skin cancer Brother   . Stroke Paternal Grandfather   . Breast cancer Neg Hx     SOCIAL HX: reviewed.    Current Outpatient Medications:  .  benzonatate (TESSALON) 100 MG capsule, Take 1 capsule (100 mg total) by mouth 2 (two) times daily as needed for cough., Disp: 21 capsule, Rfl: 0 .  folic acid (FOLVITE) 1 MG tablet, Take 2 tablets (2 mg total) by mouth daily., Disp: 180  tablet, Rfl: 3 .  levothyroxine (SYNTHROID) 75 MCG tablet, TAKE 1 TABLET BY MOUTH ONCE DAILY ON AN EMPTY STOMACH. WAIT 30 MINUTES BEFORE TAKING OTHER MEDS., Disp: 90 tablet, Rfl: 1 .  methotrexate (RHEUMATREX) 2.5 MG tablet, Take 4 tablets (10 mg total) by mouth once a week. Caution: Protect from light., Disp: 16 tablet, Rfl: 3 .  pantoprazole (PROTONIX) 40 MG tablet, 40 mg daily., Disp: , Rfl:  .  pyridostigmine (MESTINON) 60 MG tablet, TAKE 1 TABLET BY MOUTH AT  8 AM, ONE AT LUNCH AND ONE AT 8PM, Disp: 90 tablet, Rfl: 6 .  risperiDONE (RISPERDAL) 2 MG tablet, , Disp: , Rfl:  .  sucralfate (CARAFATE) 1 g tablet, Take 1 g by  mouth 2 (two) times daily., Disp: , Rfl:  .  tolterodine (DETROL Burns) 4 MG 24 hr capsule, TAKE 1 CAPSULE BY MOUTH ONCE EVERY MORNING, Disp: 90 capsule, Rfl: 1  EXAM:  GENERAL: alert, oriented, appears well and in no acute distress  HEENT: atraumatic, conjunttiva clear, no obvious abnormalities on inspection of external nose and ears  NECK: normal movements of the head and neck  LUNGS: on inspection no signs of respiratory distress, breathing rate appears normal, no obvious [place] SOB, gasping or wheezing  CV: no obvious cyanosis  PSYCH/NEURO: pleasant and cooperative, no obvious depression or anxiety, speech and thought processing grossly intact  ASSESSMENT AND PLAN:  Discussed the following assessment and plan:  Hypercholesterolemia Low cholesterol diet and exercise.  Follow lipid panel.  Sister declined cholesterol medication.   Hyperglycemia Low carb diet and exercise.  Follow met b and a1c.   Hypertension Blood pressure under good control.  Continue same medication regimen.  Follow pressures.  Follow metabolic panel.    MG with exacerbation (myasthenia gravis) Followed by neurology.  On mestinon and MTX.    OSA (obstructive sleep apnea) CPAP.   Mediastinal mass Found on recent CT.  Seeing Dr Madison Burns.  Biopsy.  Has f/u planned next week.    Renal cell cancer, left [facility]) Seeing urology.     Meds ordered this encounter  Medications  . benzonatate (TESSALON) 100 MG capsule    Sig: Take 1 capsule (100 mg total) by mouth 2 (two) times daily as needed for cough.    Dispense:  21 capsule    Refill:  0     I discussed the assessment and treatment plan with the patient. The patient was provided an opportunity to ask questions and all were answered. The patient agreed with the plan and demonstrated an understanding of the instructions.   The patient was advised to call back or seek an in-person evaluation if the symptoms worsen or if the condition fails to improve as  anticipated.   Einar Pheasant, MD

## 2019-04-07 ENCOUNTER — Other Ambulatory Visit: Payer: PPO

## 2019-04-07 DIAGNOSIS — G4700 Insomnia, unspecified: Secondary | ICD-10-CM | POA: Diagnosis not present

## 2019-04-07 DIAGNOSIS — F79 Unspecified intellectual disabilities: Secondary | ICD-10-CM | POA: Diagnosis not present

## 2019-04-07 DIAGNOSIS — F39 Unspecified mood [affective] disorder: Secondary | ICD-10-CM | POA: Diagnosis not present

## 2019-04-07 LAB — SURGICAL PATHOLOGY

## 2019-04-07 NOTE — Progress Notes (Signed)
Tumor Board Documentation  Madison Burns was presented by Dr Erlene Quan at our Tumor Board on 04/07/2019, which included representatives from medical oncology, radiation oncology, navigation, pathology, radiology, surgical, internal medicine, genetics, pulmonology, palliative care, research.  Madison Burns currently presents as an external consult, for [place], for new positive pathology with history of the following treatments: active survellience, surgical intervention(s).  Additionally, we reviewed previous medical and familial history, history of present illness, and recent lab results along with all available histopathologic and imaging studies. The tumor board considered available treatment options and made the following recommendations: Active surveillance Ablative therapy, Consult Anesthesia, follow up with Dr Genevive Bi regarding Thymoma  The following procedures/referrals were also placed: No orders of the defined types were placed in this encounter.   Clinical Trial Status: not discussed   Staging used: Pathologic Stage  AJCC Staging:       Group: Renal Cell Carcinoma undetermined subtype  National site-specific guidelines   were discussed with respect to the case.  Tumor board is a meeting of clinicians from various specialty areas who evaluate and discuss patients for whom a multidisciplinary approach is being considered. Final determinations in the plan of care are those of the provider(s). The responsibility for follow up of recommendations given during tumor board is that of the provider.   Today's extended care, comprehensive team conference, Madison Burns was not present for the discussion and was not examined.   Multidisciplinary Tumor Board is a multidisciplinary case peer review process.  Decisions discussed in the Multidisciplinary Tumor Board reflect the opinions of the specialists present at the conference without having examined the patient.  Ultimately, treatment and diagnostic  decisions rest with the primary provider(s) and the patient.

## 2019-04-11 ENCOUNTER — Encounter: Payer: Self-pay | Admitting: Cardiothoracic Surgery

## 2019-04-11 ENCOUNTER — Encounter: Payer: Self-pay | Admitting: Internal Medicine

## 2019-04-11 ENCOUNTER — Ambulatory Visit: Payer: PPO | Admitting: Cardiothoracic Surgery

## 2019-04-11 ENCOUNTER — Telehealth: Payer: Self-pay

## 2019-04-11 ENCOUNTER — Other Ambulatory Visit: Payer: Self-pay

## 2019-04-11 VITALS — BP 114/82 | HR 95 | Temp 98.2°F | Resp 14 | Ht 63.0 in | Wt 206.0 lb

## 2019-04-11 DIAGNOSIS — J9859 Other diseases of mediastinum, not elsewhere classified: Secondary | ICD-10-CM | POA: Insufficient documentation

## 2019-04-11 DIAGNOSIS — D4989 Neoplasm of unspecified behavior of other specified sites: Secondary | ICD-10-CM | POA: Diagnosis not present

## 2019-04-11 NOTE — Assessment & Plan Note (Signed)
Followed by neurology.  On mestinon and MTX.

## 2019-04-11 NOTE — Assessment & Plan Note (Signed)
Low cholesterol diet and exercise.  Follow lipid panel.  Sister declined cholesterol medication.

## 2019-04-11 NOTE — Assessment & Plan Note (Signed)
Seeing urology

## 2019-04-11 NOTE — Assessment & Plan Note (Signed)
Low carb diet and exercise.  Follow met b and a1c.  

## 2019-04-11 NOTE — Progress Notes (Signed)
  Patient ID: Madison Burns, female   DOB: 10/07/1950, 69 y.o.   MRN: [id number]  HISTORY: She comes in today following her mediastinal mass biopsy as well as her left renal mass biopsy.  The anterior mediastinal mass was a thymoma and the renal mass was a renal cell carcinoma.  She is scheduled to follow-up with Dr. Erlene Quan in about a week.  She states she did well after both these biopsies.  She has no new complaints.   Vitals:   04/11/19 1450  BP: 114/82  Pulse: 95  Resp: 14  Temp: 98.2 F (36.8 C)  SpO2: 97%     EXAM:    Resp: Lungs are clear bilaterally.  No respiratory distress, normal effort. Heart:  Regular without murmurs Abd:  Abdomen is soft, non distended and non tender. No masses are palpable.  There is no rebound and no guarding.  Neurological: Alert and oriented to person, place, and time. Coordination normal.  Skin: Skin is warm and dry. No rash noted. No diaphoretic. No erythema. No pallor. Both biopsy sites are clean dry and intact Psychiatric: Normal mood and affect. Normal behavior. Judgment and thought content normal.    ASSESSMENT: Anterior mediastinal mass which is an unresectable invasive thymoma   PLAN:   I explained this to the patient and to her sister who accompanied her today.  I think that she would be best served with a combination of radiation therapy and chemotherapy for her anterior mediastinal thymoma.  She understands.  We made an appointment for her to see our oncologist and radiation therapist.    Nestor Lewandowsky, MD

## 2019-04-11 NOTE — Assessment & Plan Note (Signed)
Blood pressure under good control.  Continue same medication regimen.  Follow pressures.  Follow metabolic panel.   

## 2019-04-11 NOTE — Patient Instructions (Addendum)
We will refer you to Medical Oncology and Radiation Oncology at the [facility]. They will be in contact with you to schedule these appointments.   Follow-up with our office as needed.  Please call and ask to speak with a nurse if you develop questions or concerns.

## 2019-04-11 NOTE — Assessment & Plan Note (Signed)
CPAP.  

## 2019-04-11 NOTE — Telephone Encounter (Signed)
Per Dr.Oaks-patient added to his schedule today at 3pm. Patient notified.

## 2019-04-11 NOTE — Assessment & Plan Note (Signed)
Found on recent CT.  Seeing Dr Genevive Bi.  Biopsy.  Has f/u planned next week.

## 2019-04-15 ENCOUNTER — Ambulatory Visit
Admission: RE | Admit: 2019-04-15 | Discharge: 2019-04-15 | Disposition: A | Discharge status: Home / Self Care | Payer: PPO | Source: Ambulatory Visit | Attending: Radiation Oncology | Admitting: Radiation Oncology

## 2019-04-15 ENCOUNTER — Encounter: Payer: Self-pay | Admitting: Radiation Oncology

## 2019-04-15 ENCOUNTER — Inpatient Hospital Stay: Payer: PPO | Attending: Oncology

## 2019-04-15 ENCOUNTER — Other Ambulatory Visit: Payer: Self-pay

## 2019-04-15 ENCOUNTER — Encounter: Payer: Self-pay | Admitting: Oncology

## 2019-04-15 ENCOUNTER — Inpatient Hospital Stay: Payer: PPO | Attending: Oncology | Admitting: Oncology

## 2019-04-15 VITALS — BP 125/81 | HR 79 | Resp 18 | Wt 206.4 lb

## 2019-04-15 VITALS — BP 125/81 | HR 79 | Temp 96.2°F | Resp 18 | Ht 63.0 in | Wt 206.4 lb

## 2019-04-15 DIAGNOSIS — Z85820 Personal history of malignant melanoma of skin: Secondary | ICD-10-CM | POA: Insufficient documentation

## 2019-04-15 DIAGNOSIS — R918 Other nonspecific abnormal finding of lung field: Secondary | ICD-10-CM | POA: Insufficient documentation

## 2019-04-15 DIAGNOSIS — Z808 Family history of malignant neoplasm of other organs or systems: Secondary | ICD-10-CM | POA: Diagnosis not present

## 2019-04-15 DIAGNOSIS — C37 Malignant neoplasm of thymus: Secondary | ICD-10-CM | POA: Diagnosis not present

## 2019-04-15 DIAGNOSIS — Z79899 Other long term (current) drug therapy: Secondary | ICD-10-CM | POA: Insufficient documentation

## 2019-04-15 DIAGNOSIS — I10 Essential (primary) hypertension: Secondary | ICD-10-CM | POA: Insufficient documentation

## 2019-04-15 DIAGNOSIS — G7000 Myasthenia gravis without (acute) exacerbation: Secondary | ICD-10-CM | POA: Diagnosis not present

## 2019-04-15 DIAGNOSIS — Z833 Family history of diabetes mellitus: Secondary | ICD-10-CM

## 2019-04-15 DIAGNOSIS — F79 Unspecified intellectual disabilities: Secondary | ICD-10-CM | POA: Insufficient documentation

## 2019-04-15 DIAGNOSIS — K219 Gastro-esophageal reflux disease without esophagitis: Secondary | ICD-10-CM | POA: Insufficient documentation

## 2019-04-15 DIAGNOSIS — D4989 Neoplasm of unspecified behavior of other specified sites: Secondary | ICD-10-CM | POA: Insufficient documentation

## 2019-04-15 DIAGNOSIS — C642 Malignant neoplasm of left kidney, except renal pelvis: Secondary | ICD-10-CM | POA: Insufficient documentation

## 2019-04-15 DIAGNOSIS — Z8249 Family history of ischemic heart disease and other diseases of the circulatory system: Secondary | ICD-10-CM | POA: Diagnosis not present

## 2019-04-15 DIAGNOSIS — Z7189 Other specified counseling: Secondary | ICD-10-CM

## 2019-04-15 DIAGNOSIS — Z8349 Family history of other endocrine, nutritional and metabolic diseases: Secondary | ICD-10-CM | POA: Insufficient documentation

## 2019-04-15 DIAGNOSIS — E7800 Pure hypercholesterolemia, unspecified: Secondary | ICD-10-CM | POA: Insufficient documentation

## 2019-04-15 DIAGNOSIS — Z85828 Personal history of other malignant neoplasm of skin: Secondary | ICD-10-CM | POA: Diagnosis not present

## 2019-04-15 LAB — COMPREHENSIVE METABOLIC PANEL
ALT: 21 U/L (ref 0–44)
AST: 27 U/L (ref 15–41)
Albumin: 4.1 g/dL (ref 3.5–5.0)
Alkaline Phosphatase: 52 U/L (ref 38–126)
Anion gap: 11 (ref 5–15)
BUN: 24 mg/dL — ABNORMAL HIGH (ref 8–23)
CO2: 24 mmol/L (ref 22–32)
Calcium: 9.3 mg/dL (ref 8.9–10.3)
Chloride: 105 mmol/L (ref 98–111)
Creatinine, Ser: 0.79 mg/dL (ref 0.44–1.00)
GFR calc Af Amer: >60 mL/min (ref >60)
GFR calc non Af Amer: >60 mL/min (ref >60)
Glucose, Bld: 103 mg/dL — ABNORMAL HIGH (ref 70–99)
Potassium: 3.8 mmol/L (ref 3.5–5.1)
Sodium: 140 mmol/L (ref 135–145)
Total Bilirubin: 0.6 mg/dL (ref 0.3–1.2)
Total Protein: 7.2 g/dL (ref 6.5–8.1)

## 2019-04-15 LAB — CBC WITH DIFFERENTIAL/PLATELET
Abs Immature Granulocytes: 0.03 10*3/uL (ref 0.00–0.07)
Basophils Absolute: 0.1 10*3/uL (ref 0.0–0.1)
Basophils Relative: 1 %
Eosinophils Absolute: 0.1 10*3/uL (ref 0.0–0.5)
Eosinophils Relative: 1 %
HCT: 44.9 % (ref 36.0–46.0)
Hemoglobin: 14.6 g/dL (ref 12.0–15.0)
Immature Granulocytes: 0 %
Lymphocytes Relative: 17 %
Lymphs Abs: 1.7 10*3/uL (ref 0.7–4.0)
MCH: 31.3 pg (ref 26.0–34.0)
MCHC: 32.5 g/dL (ref 30.0–36.0)
MCV: 96.1 fL (ref 80.0–100.0)
Monocytes Absolute: 0.7 10*3/uL (ref 0.1–1.0)
Monocytes Relative: 7 %
Neutro Abs: 7.6 10*3/uL (ref 1.7–7.7)
Neutrophils Relative %: 74 %
Platelets: 178 10*3/uL (ref 150–400)
RBC: 4.67 MIL/uL (ref 3.87–5.11)
RDW: 15.1 % (ref 11.5–15.5)
WBC: 10.1 10*3/uL (ref 4.0–10.5)
nRBC: 0 % (ref 0.0–0.2)

## 2019-04-15 NOTE — Patient Instructions (Signed)
Paclitaxel injection What is this medicine? PACLITAXEL (PAK li TAX el) is a chemotherapy drug. It targets fast dividing cells, like cancer cells, and causes these cells to die. This medicine is used to treat ovarian cancer, breast cancer, lung cancer, Kaposi's sarcoma, and other cancers. This medicine may be used for other purposes; ask your health care provider or pharmacist if you have questions. COMMON BRAND NAME(S): Onxol, Taxol What should I tell my health care provider before I take this medicine? They need to know if you have any of these conditions:  history of irregular heartbeat  liver disease  low blood counts, like low white cell, platelet, or red cell counts  lung or breathing disease, like asthma  tingling of the fingers or toes, or other nerve disorder  an unusual or allergic reaction to paclitaxel, alcohol, polyoxyethylated castor oil, other chemotherapy, other medicines, foods, dyes, or preservatives  pregnant or trying to get pregnant  breast-feeding How should I use this medicine? This drug is given as an infusion into a vein. It is administered in a hospital or clinic by a specially trained health care professional. Talk to your pediatrician regarding the use of this medicine in children. Special care may be needed. Overdosage: If you think you have taken too much of this medicine contact a poison control center or emergency room at once. NOTE: This medicine is only for you. Do not share this medicine with others. What if I miss a dose? It is important not to miss your dose. Call your doctor or health care professional if you are unable to keep an appointment. What may interact with this medicine? Do not take this medicine with any of the following medications:  disulfiram  metronidazole This medicine may also interact with the following medications:  antiviral medicines for hepatitis, HIV or AIDS  certain antibiotics like erythromycin and  clarithromycin  certain medicines for fungal infections like ketoconazole and itraconazole  certain medicines for seizures like carbamazepine, phenobarbital, phenytoin  gemfibrozil  nefazodone  rifampin  St. John's wort This list may not describe all possible interactions. Give your health care provider a list of all the medicines, herbs, non-prescription drugs, or dietary supplements you use. Also tell them if you smoke, drink alcohol, or use illegal drugs. Some items may interact with your medicine. What should I watch for while using this medicine? Your condition will be monitored carefully while you are receiving this medicine. You will need important blood work done while you are taking this medicine. This medicine can cause serious allergic reactions. To reduce your risk you will need to take other medicine(s) before treatment with this medicine. If you experience allergic reactions like skin rash, itching or hives, swelling of the face, lips, or tongue, tell your doctor or health care professional right away. In some cases, you may be given additional medicines to help with side effects. Follow all directions for their use. This drug may make you feel generally unwell. This is not uncommon, as chemotherapy can affect healthy cells as well as cancer cells. Report any side effects. Continue your course of treatment even though you feel ill unless your doctor tells you to stop. Call your doctor or health care professional for advice if you get a fever, chills or sore throat, or other symptoms of a cold or flu. Do not treat yourself. This drug decreases your body's ability to fight infections. Try to avoid being around people who are sick. This medicine may increase your risk to bruise   or bleed. Call your doctor or health care professional if you notice any unusual bleeding. Be careful brushing and flossing your teeth or using a toothpick because you may get an infection or bleed more easily.  If you have any dental work done, tell your dentist you are receiving this medicine. Avoid taking products that contain aspirin, acetaminophen, ibuprofen, naproxen, or ketoprofen unless instructed by your doctor. These medicines may hide a fever. Do not become pregnant while taking this medicine. Women should inform their doctor if they wish to become pregnant or think they might be pregnant. There is a potential for serious side effects to an unborn child. Talk to your health care professional or pharmacist for more information. Do not breast-feed an infant while taking this medicine. Men are advised not to father a child while receiving this medicine. This product may contain alcohol. Ask your pharmacist or healthcare provider if this medicine contains alcohol. Be sure to tell all healthcare providers you are taking this medicine. Certain medicines, like metronidazole and disulfiram, can cause an unpleasant reaction when taken with alcohol. The reaction includes flushing, headache, nausea, vomiting, sweating, and increased thirst. The reaction can last from 30 minutes to several hours. What side effects may I notice from receiving this medicine? Side effects that you should report to your doctor or health care professional as soon as possible:  allergic reactions like skin rash, itching or hives, swelling of the face, lips, or tongue  breathing problems  changes in vision  fast, irregular heartbeat  high or low blood pressure  mouth sores  pain, tingling, numbness in the hands or feet  signs of decreased platelets or bleeding - bruising, pinpoint red spots on the skin, black, tarry stools, blood in the urine  signs of decreased red blood cells - unusually weak or tired, feeling faint or lightheaded, falls  signs of infection - fever or chills, cough, sore throat, pain or difficulty passing urine  signs and symptoms of liver injury like dark yellow or brown urine; general ill feeling or  flu-like symptoms; light-colored stools; loss of appetite; nausea; right upper belly pain; unusually weak or tired; yellowing of the eyes or skin  swelling of the ankles, feet, hands  unusually slow heartbeat Side effects that usually do not require medical attention (report to your doctor or health care professional if they continue or are bothersome):  diarrhea  hair loss  loss of appetite  muscle or joint pain  nausea, vomiting  pain, redness, or irritation at site where injected  tiredness This list may not describe all possible side effects. Call your doctor for medical advice about side effects. You may report side effects to FDA at 1-800-FDA-1088. Where should I keep my medicine? This drug is given in a hospital or clinic and will not be stored at home. NOTE: This sheet is a summary. It may not cover all possible information. If you have questions about this medicine, talk to your doctor, pharmacist, or health care provider.  2020 Elsevier/Gold Standard (2016-10-07 13:14:55) Carboplatin injection What is this medicine? CARBOPLATIN (KAR boe pla tin) is a chemotherapy drug. It targets fast dividing cells, like cancer cells, and causes these cells to die. This medicine is used to treat ovarian cancer and many other cancers. This medicine may be used for other purposes; ask your health care provider or pharmacist if you have questions. COMMON BRAND NAME(S): Paraplatin What should I tell my health care provider before I take this medicine? They need to   know if you have any of these conditions:  blood disorders  hearing problems  kidney disease  recent or ongoing radiation therapy  an unusual or allergic reaction to carboplatin, cisplatin, other chemotherapy, other medicines, foods, dyes, or preservatives  pregnant or trying to get pregnant  breast-feeding How should I use this medicine? This drug is usually given as an infusion into a vein. It is administered in a  hospital or clinic by a specially trained health care professional. Talk to your pediatrician regarding the use of this medicine in children. Special care may be needed. Overdosage: If you think you have taken too much of this medicine contact a poison control center or emergency room at once. NOTE: This medicine is only for you. Do not share this medicine with others. What if I miss a dose? It is important not to miss a dose. Call your doctor or health care professional if you are unable to keep an appointment. What may interact with this medicine?  medicines for seizures  medicines to increase blood counts like filgrastim, pegfilgrastim, sargramostim  some antibiotics like amikacin, gentamicin, neomycin, streptomycin, tobramycin  vaccines Talk to your doctor or health care professional before taking any of these medicines:  acetaminophen  aspirin  ibuprofen  ketoprofen  naproxen This list may not describe all possible interactions. Give your health care provider a list of all the medicines, herbs, non-prescription drugs, or dietary supplements you use. Also tell them if you smoke, drink alcohol, or use illegal drugs. Some items may interact with your medicine. What should I watch for while using this medicine? Your condition will be monitored carefully while you are receiving this medicine. You will need important blood work done while you are taking this medicine. This drug may make you feel generally unwell. This is not uncommon, as chemotherapy can affect healthy cells as well as cancer cells. Report any side effects. Continue your course of treatment even though you feel ill unless your doctor tells you to stop. In some cases, you may be given additional medicines to help with side effects. Follow all directions for their use. Call your doctor or health care professional for advice if you get a fever, chills or sore throat, or other symptoms of a cold or flu. Do not treat  yourself. This drug decreases your body's ability to fight infections. Try to avoid being around people who are sick. This medicine may increase your risk to bruise or bleed. Call your doctor or health care professional if you notice any unusual bleeding. Be careful brushing and flossing your teeth or using a toothpick because you may get an infection or bleed more easily. If you have any dental work done, tell your dentist you are receiving this medicine. Avoid taking products that contain aspirin, acetaminophen, ibuprofen, naproxen, or ketoprofen unless instructed by your doctor. These medicines may hide a fever. Do not become pregnant while taking this medicine. Women should inform their doctor if they wish to become pregnant or think they might be pregnant. There is a potential for serious side effects to an unborn child. Talk to your health care professional or pharmacist for more information. Do not breast-feed an infant while taking this medicine. What side effects may I notice from receiving this medicine? Side effects that you should report to your doctor or health care professional as soon as possible:  allergic reactions like skin rash, itching or hives, swelling of the face, lips, or tongue  signs of infection - fever or   chills, cough, sore throat, pain or difficulty passing urine  signs of decreased platelets or bleeding - bruising, pinpoint red spots on the skin, black, tarry stools, nosebleeds  signs of decreased red blood cells - unusually weak or tired, fainting spells, lightheadedness  breathing problems  changes in hearing  changes in vision  chest pain  high blood pressure  low blood counts - This drug may decrease the number of white blood cells, red blood cells and platelets. You may be at increased risk for infections and bleeding.  nausea and vomiting  pain, swelling, redness or irritation at the injection site  pain, tingling, numbness in the hands or  feet  problems with balance, talking, walking  trouble passing urine or change in the amount of urine Side effects that usually do not require medical attention (report to your doctor or health care professional if they continue or are bothersome):  hair loss  loss of appetite  metallic taste in the mouth or changes in taste This list may not describe all possible side effects. Call your doctor for medical advice about side effects. You may report side effects to FDA at 1-800-FDA-1088. Where should I keep my medicine? This drug is given in a hospital or clinic and will not be stored at home. NOTE: This sheet is a summary. It may not cover all possible information. If you have questions about this medicine, talk to your doctor, pharmacist, or health care provider.  2020 Elsevier/Gold Standard (2007-05-11 14:38:05)  

## 2019-04-15 NOTE — Consult Note (Signed)
NEW PATIENT EVALUATION  Name: Madison Burns  MRN: [id number]  Date:   04/15/2019     DOB: 05-Feb-1951   This 69 y.o. female patient presents to the clinic for initial evaluation of thymoma [place] stage IVa  REFERRING PHYSICIAN: Einar Pheasant, MD  CHIEF COMPLAINT:  Chief Complaint  Patient presents with  . Cancer    Initial consultation    DIAGNOSIS: The encounter diagnosis was Thymoma, malignant ([facility]).   PREVIOUS INVESTIGATIONS:  CT scans reviewed Pathology report reviewed Clinical notes reviewed  HPI: Patient is a 69 year old female with myasthenia gravis who was found on recent CT scan to have an anterior mediastinal mass.  She was also noted to have a kidney lesion.  Left kidney mass was biopsied and positive for renal cell carcinoma.  She had a CT-guided biopsy of the anterior mediastinal mass which was positive for thymoma predominantly B2 pattern with subset of B3 pattern.  Features of squamous cell carcinoma were not identified.  Immunohistochemistry was supportive of above diagnosis.  CT scan of the chest demonstrated lobulated internal calcified mass of the anterior mediastinum effacing the central portion of the left brachiocephalic vein abuts the tubular ascending aorta measuring approximate 5.8 x 4.9 x 4.1 cm.  There is also adjacent pleural nodularity about the right upper lobe.  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  Patient does have some slight swallowing problems which has according to her sister been present for years.  She specifically denies cough hemoptysis or chest tightness.  She has been seen by Dr. Faith Rogue and not thought to be a surgical candidate based on the extent of her disease and has been referred to radiation oncology as well as medical oncology for evaluation.  PLANNED TREATMENT REGIMEN: Concurrent chemoradiation  PAST MEDICAL HISTORY:  has a past medical history of Cancer of kidney ([place]) (2021), GERD (gastroesophageal  reflux disease), History of seizure disorder, Hypercholesterolemia, Hypertension, Irritable bowel syndrome, Myasthenia gravis ([place]) (2011), OSA on CPAP, and Skin cancer (2017).    PAST SURGICAL HISTORY:  Past Surgical History:  Procedure Laterality Date  . ABDOMINAL HYSTERECTOMY  1990  . APPENDECTOMY  1999  . CHOLECYSTECTOMY  2013  . COLONOSCOPY WITH PROPOFOL N/A 06/30/2017   Procedure: COLONOSCOPY WITH PROPOFOL;  Surgeon: Toledo, Benay Pike, MD;  Location: [facility] ENDOSCOPY;  Service: Gastroenterology;  Laterality: N/A;  . ESOPHAGOGASTRODUODENOSCOPY (EGD) WITH PROPOFOL N/A 06/30/2017   Procedure: ESOPHAGOGASTRODUODENOSCOPY (EGD) WITH PROPOFOL;  Surgeon: Toledo, Benay Pike, MD;  Location: [facility] ENDOSCOPY;  Service: Gastroenterology;  Laterality: N/A;  . TONSILLECTOMY  1959    FAMILY HISTORY: family history includes Alzheimer's disease in her mother; Bone cancer in an other family member; Colon cancer in her maternal grandfather; Diabetes in her sister; Heart disease in her father; Hyperlipidemia in her father and sister; Myasthenia gravis in her brother; Skin cancer in her brother and mother; Stroke in her paternal grandfather.  SOCIAL HISTORY:  reports that she has never smoked. She has never used smokeless tobacco. She reports that she does not drink alcohol or use drugs.  ALLERGIES: Ciprofloxacin and Levofloxacin  MEDICATIONS:  Current Outpatient Medications  Medication Sig Dispense Refill  . benzonatate (TESSALON) 100 MG capsule Take 1 capsule (100 mg total) by mouth 2 (two) times daily as needed for cough. 21 capsule 0  . folic acid (FOLVITE) 1 MG tablet Take 2 tablets (2 mg total) by mouth daily. 180 tablet 3  . levothyroxine (SYNTHROID) 75 MCG tablet TAKE 1 TABLET BY MOUTH ONCE  DAILY ON AN EMPTY STOMACH. WAIT 30 MINUTES BEFORE TAKING OTHER MEDS. 90 tablet 1  . methotrexate (RHEUMATREX) 2.5 MG tablet Take 4 tablets (10 mg total) by mouth once a week. Caution: Protect from light. 16 tablet 3   . pantoprazole (PROTONIX) 40 MG tablet 40 mg daily.    [name] pyridostigmine (MESTINON) 60 MG tablet TAKE 1 TABLET BY MOUTH AT  8 AM, ONE AT LUNCH AND ONE AT 8PM 90 tablet 6  . risperiDONE (RISPERDAL) 2 MG tablet     . sucralfate (CARAFATE) 1 g tablet Take 1 g by mouth 2 (two) times daily.    [name] tolterodine (DETROL LA) 4 MG 24 hr capsule TAKE 1 CAPSULE BY MOUTH ONCE EVERY MORNING 90 capsule 1   No current facility-administered medications for this encounter.    ECOG PERFORMANCE STATUS:  1 - Symptomatic but completely ambulatory  REVIEW OF SYSTEMS: Patient does have myasthenia gravis.  She is a poor historian.  Accompanied by her sister. Patient denies any weight loss, fatigue, weakness, fever, chills or night sweats. Patient denies any loss of vision, blurred vision. Patient denies any ringing  of the ears or hearing loss. No irregular heartbeat. Patient denies heart murmur or history of fainting. Patient denies any chest pain or pain radiating to her upper extremities. Patient denies any shortness of breath, difficulty breathing at night, cough or hemoptysis. Patient denies any swelling in the lower legs. Patient denies any nausea vomiting, vomiting of blood, or coffee ground material in the vomitus. Patient denies any stomach pain. Patient states has had normal bowel movements no significant constipation or diarrhea. Patient denies any dysuria, hematuria or significant nocturia. Patient denies any problems walking, swelling in the joints or loss of balance. Patient denies any skin changes, loss of hair or loss of weight. Patient denies any excessive worrying or anxiety or significant depression. Patient denies any problems with insomnia. Patient denies excessive thirst, polyuria, polydipsia. Patient denies any swollen glands, patient denies easy bruising or easy bleeding. Patient denies any recent infections, allergies or URI. Patient "s visual fields have not changed significantly in recent time.    PHYSICAL EXAM: BP 125/81 (BP Location: Left Arm, Patient Position: Sitting)   Pulse 79   Resp 18   Wt 206 lb 6.4 oz (93.6 kg)   BMI 36.56 kg/m  Well-developed well-nourished patient in NAD. HEENT reveals PERLA, EOMI, discs not visualized.  Oral cavity is clear. No oral mucosal lesions are identified. Neck is clear without evidence of cervical or supraclavicular adenopathy. Lungs are clear to A&P. Cardiac examination is essentially unremarkable with regular rate and rhythm without murmur rub or thrill. Abdomen is benign with no organomegaly or masses noted. Motor sensory and DTR levels are equal and symmetric in the upper and lower extremities. Cranial nerves II through XII are grossly intact. Proprioception is intact. No peripheral adenopathy or edema is identified. No motor or sensory levels are noted. Crude visual fields are within normal range.  LABORATORY DATA: Pathology report reviewed    RADIOLOGY RESULTS: CT scans reviewed   IMPRESSION: Stage IVa thymoma of the anterior mediastinum and 69 year old female with myasthenia gravis nonsurgical candidate based on interpretation and reviewed by me thoracic surgery  PLAN: At this time I to go ahead with radiation therapy to her chest.  I will plan on delivering 60 Gy over 6 weeks using IMRT treatment planning and delivery.  I would choose IMRT treatment planning delivered to spare critical structures such as her heart esophagus spinal  cord and normal lung volume.  I have discussed the case first with medical oncology who will be discussing concurrent chemotherapy.  Even though this is a benign lesion recommendations for unresectable disease especially for stage IVa disease is treatment as outlined above.  Risks and benefits of treatment including possible dysphagia from radiation esophagitis fatigue alteration of blood count skin reaction all were discussed in detail with the patient and her sister.  They both seem to comprehend my treatment plan  well.  I personally set up and ordered CT simulation next week.  There will be extra effort by both professional staff as well as technical staff to coordinate and manage concurrent chemoradiation and ensuing side effects during her treatments.  I would like to take this opportunity to thank you for allowing me to participate in the care of your patient.Noreene Filbert, MD

## 2019-04-15 NOTE — Progress Notes (Signed)
Patient here to establish care for Thymoma. Referred by Dr. Genevive Bi. Patient here with sister, [name].

## 2019-04-16 MED ORDER — DEXAMETHASONE 4 MG PO TABS: 8.0000 mg | ORAL_TABLET | Freq: Every day | ORAL | 1 refills | Status: DC

## 2019-04-16 MED ORDER — ONDANSETRON HCL 8 MG PO TABS: 8.0000 mg | ORAL_TABLET | Freq: Two times a day (BID) | ORAL | 1 refills | Status: DC | PRN

## 2019-04-16 MED ORDER — PROCHLORPERAZINE MALEATE 10 MG PO TABS: 10.0000 mg | ORAL_TABLET | Freq: Four times a day (QID) | ORAL | 1 refills | Status: DC | PRN

## 2019-04-16 NOTE — Progress Notes (Signed)
Hematology/Oncology Consult note [facility] Telephone:([phone] [phone]   Patient Care Team: Einar Pheasant, MD as PCP - General (Internal Medicine)  REFERRING PROVIDER: Nestor Lewandowsky, MD  CHIEF COMPLAINTS/REASON FOR VISIT:  Evaluation of thymoma  HISTORY OF PRESENTING ILLNESS:   Madison Burns is a  69 y.o.  female with PMH listed below was seen in consultation at the request of  Nestor Lewandowsky, MD  for evaluation of thymoma.  Patient was accompanied by her sister today.  History was obtained from her sister.  Patient has intellectual disability Patient initially underwent a CT abdomen pelvis with contrast on 02/07/2019 for worsening umbilical pain.  It reviewed incidental 3 cm renal mass on the left.  Subsequent MRCP 02/21/2019 performed for the purpose of right upper quadrant pain as well as renal mass in question.  MRI 02/20/2018 showed 3.2 x 2.6 left lower pole endophytic renal mass, consistent with possible renal cell carcinoma.  No signs of metastatic disease in abdomen.  Adrenal lesion more suggestive adenomas.  There was an incidental finding of lobular area along the anterior mediastinum, questionably e mediastinal mass.  CT scan 02/07/2019 showed lobulated internally calcified mass of the anterior mediastinum which effaces the central portion of the left brachiocephalic vein and a very closely abuts the tubular ascending aorta, 5.8 x 4.9 x 4.1 cm.  Adjacent pleural nodularity about the right upper lobe. 3 mm pulmonary nodule of the right upper lobe, hepatic steatosis.  #Patient has a chronic history of myasthenia gravis, initially diagnosed in 2011.  Patient had been treated with prednisone for prolonged period of time.  Recently she has been off prednisone and is currently taking methotrexate and pyridostigmine for symptoms.  Per sister, patient does not complain much of weakness.  At her baseline.  She is able to feed and dress herself.  She  needs some assistance from her sister for bathing.  She likes to watch TV.  Patient has been seen by Dr. Genevive Bi and Dr. Erlene Quan.  Patient's sister would like to entertain the diagnosis and potential therapy. Patient underwent CT-guided needle biopsy of the anterior mediastinal mass, as well as biopsy of the kidney mass Anterior mediastinal mass biopsy pathology showed thymoma, predominantly B2 patent with subsets of a B3 pattern. Left kidney mass showed renal cell carcinoma,tumor cannot be reliably subtyped based on this limited sample and the IHC profile  She has a history of melanoma on her neck in 2017.    Sister is POA. Patient lives with sister Review of Systems  Unable to perform ROS: Other (intellectual disability)    MEDICAL HISTORY:  Past Medical History:  Diagnosis Date  . Cancer of kidney ([place]) 2021  . GERD (gastroesophageal reflux disease)   . History of seizure disorder   . Hypercholesterolemia   . Hypertension   . Irritable bowel syndrome   . Myasthenia gravis ([place]) 2011  . OSA on CPAP   . Skin cancer 2017   Melanoma on neck    SURGICAL HISTORY: Past Surgical History:  Procedure Laterality Date  . ABDOMINAL HYSTERECTOMY  1990  . APPENDECTOMY  1999  . CHOLECYSTECTOMY  2013  . COLONOSCOPY WITH PROPOFOL N/A 06/30/2017   Procedure: COLONOSCOPY WITH PROPOFOL;  Surgeon: Toledo, Benay Pike, MD;  Location: [facility] ENDOSCOPY;  Service: Gastroenterology;  Laterality: N/A;  . ESOPHAGOGASTRODUODENOSCOPY (EGD) WITH PROPOFOL N/A 06/30/2017   Procedure: ESOPHAGOGASTRODUODENOSCOPY (EGD) WITH PROPOFOL;  Surgeon: Toledo, Benay Pike, MD;  Location: [facility] ENDOSCOPY;  Service: Gastroenterology;  Laterality: N/A;  .  TONSILLECTOMY  1959    SOCIAL HISTORY: Social History   Socioeconomic History  . Marital status: Single    Spouse name: Not on file  . Number of children: 0  . Years of education: Special Ed  . Highest education level: Not on file  Occupational History  . Occupation:  Unemployed  Tobacco Use  . Smoking status: Never Smoker  . Smokeless tobacco: Never Used  Substance and Sexual Activity  . Alcohol use: No    Alcohol/week: 0.0 standard drinks  . Drug use: No  . Sexual activity: Not Currently  Other Topics Concern  . Not on file  Social History Narrative   06/23/17 lives with sister, [name]   Regular exercise-no   Caffeine Use-yes   Social Determinants of Health   Financial Resource Strain:   . Difficulty of Paying Living Expenses: Not on file  Food Insecurity:   . Worried About Charity fundraiser in the Last Year: Not on file  . Ran Out of Food in the Last Year: Not on file  Transportation Needs:   . Lack of Transportation (Medical): Not on file  . Lack of Transportation (Non-Medical): Not on file  Physical Activity:   . Days of Exercise per Week: Not on file  . Minutes of Exercise per Session: Not on file  Stress:   . Feeling of Stress : Not on file  Social Connections:   . Frequency of Communication with Friends and Family: Not on file  . Frequency of Social Gatherings with Friends and Family: Not on file  . Attends Religious Services: Not on file  . Active Member of Clubs or Organizations: Not on file  . Attends Archivist Meetings: Not on file  . Marital Status: Not on file  Intimate Partner Violence:   . Fear of Current or Ex-Partner: Not on file  . Emotionally Abused: Not on file  . Physically Abused: Not on file  . Sexually Abused: Not on file    FAMILY HISTORY: Family History  Problem Relation Age of Onset  . Colon cancer Maternal Grandfather        stomach/colon  . Heart disease Father        myocardial infarction  . Hyperlipidemia Father   . Hyperlipidemia Sister   . Diabetes Sister   . Bone cancer Other        cousin  . Alzheimer's disease Mother        maternal aunts  . Skin cancer Mother   . Myasthenia gravis Brother        ocular  . Skin cancer Brother   . Stroke Paternal Grandfather   . Breast  cancer Neg Hx     ALLERGIES:  is allergic to ciprofloxacin and levofloxacin.  MEDICATIONS:  Current Outpatient Medications  Medication Sig Dispense Refill  . benzonatate (TESSALON) 100 MG capsule Take 1 capsule (100 mg total) by mouth 2 (two) times daily as needed for cough. 21 capsule 0  . folic acid (FOLVITE) 1 MG tablet Take 2 tablets (2 mg total) by mouth daily. 180 tablet 3  . levothyroxine (SYNTHROID) 75 MCG tablet TAKE 1 TABLET BY MOUTH ONCE DAILY ON AN EMPTY STOMACH. WAIT 30 MINUTES BEFORE TAKING OTHER MEDS. 90 tablet 1  . methotrexate (RHEUMATREX) 2.5 MG tablet Take 4 tablets (10 mg total) by mouth once a week. Caution: Protect from light. 16 tablet 3  . pantoprazole (PROTONIX) 40 MG tablet 40 mg daily.    [name] pyridostigmine (MESTINON) 60  MG tablet TAKE 1 TABLET BY MOUTH AT  8 AM, ONE AT LUNCH AND ONE AT 8PM 90 tablet 6  . risperiDONE (RISPERDAL) 2 MG tablet     . sucralfate (CARAFATE) 1 g tablet Take 1 g by mouth 2 (two) times daily.    [name] tolterodine (DETROL LA) 4 MG 24 hr capsule TAKE 1 CAPSULE BY MOUTH ONCE EVERY MORNING 90 capsule 1   No current facility-administered medications for this visit.     PHYSICAL EXAMINATION: ECOG PERFORMANCE STATUS: 1 - Symptomatic but completely ambulatory Vitals:   04/15/19 1028  BP: 125/81  Pulse: 79  Resp: 18  Temp: (!) 96.2 F (35.7 C)   Filed Weights   04/15/19 1028  Weight: 206 lb 6.4 oz (93.6 kg)    Physical Exam Constitutional:      Appearance: She is obese.  HENT:     Head: Normocephalic and atraumatic.  Eyes:     General: No scleral icterus. Cardiovascular:     Rate and Rhythm: Normal rate and regular rhythm.     Heart sounds: Normal heart sounds.  Pulmonary:     Effort: Pulmonary effort is normal. No respiratory distress.     Breath sounds: No wheezing.  Abdominal:     General: Bowel sounds are normal. There is no distension.     Palpations: Abdomen is soft.  Musculoskeletal:        General: No deformity. Normal  range of motion.     Cervical back: Normal range of motion and neck supple.  Skin:    General: Skin is warm and dry.     Findings: No erythema or rash.  Neurological:     Mental Status: She is alert. Mental status is at baseline.     Cranial Nerves: No cranial nerve deficit.     Coordination: Coordination normal.     Comments: Oriented in person and time.  Muscle strength, 4/5 all extremities.   Psychiatric:        Mood and Affect: Mood normal.     LABORATORY DATA:  I have reviewed the data as listed Lab Results  Component Value Date   WBC 10.1 04/15/2019   HGB 14.6 04/15/2019   HCT 44.9 04/15/2019   MCV 96.1 04/15/2019   PLT 178 04/15/2019   Recent Labs    08/06/18 1007 08/06/18 1007 12/14/18 0857 12/14/18 0857 01/01/19 1728 03/07/19 0757 04/15/19 1134  NA 141   < > 141  --  141  --  140  K 3.8   < > 3.6  --  4.0  --  3.8  CL 107   < > 105  --  106  --  105  CO2 27   < > 30  --  25  --  24  GLUCOSE 88   < > 120*  --  115*  --  103*  BUN 15   < > 14  --  13  --  24*  CREATININE 0.94   < > 0.78   < > 0.94 0.90 0.79  CALCIUM 8.7   < > 9.0  --  8.9  --  9.3  GFRNONAA  --   --   --   --  >60  --  >60  GFRAA  --   --   --   --  >60  --  >60  PROT 5.8*  --  6.1  --   --   --  7.2  ALBUMIN 3.8  --  4.0  --   --   --  4.1  AST 19  --  20  --   --   --  27  ALT 19  --  16  --   --   --  21  ALKPHOS 62  --  59  --   --   --  52  BILITOT 0.4  --  0.6  --   --   --  0.6  BILIDIR 0.1  --  0.1  --   --   --   --    < > = values in this interval not displayed.   Iron/TIBC/Ferritin/ %Sat No results found for: IRON, TIBC, FERRITIN, IRONPCTSAT    RADIOGRAPHIC STUDIES: I have personally reviewed the radiological images as listed and agreed with the findings in the report.  CT BIOPSY  Result Date: 03/28/2019 CLINICAL DATA:  Monoclonal gammopathy of unknown significance. Left lower pole renal mass. EXAM: CT GUIDED NEEDLE ASPIRATE AND CORE BIOPSY OF RIGHT ILIAC BONE MARROW  CT-GUIDED CORE BIOPSY, LEFT RENAL MASS ANESTHESIA/SEDATION: Intravenous Fentanyl 85mcg and Versed 1mg  were administered as conscious sedation during continuous monitoring of the patient's level of consciousness and physiological / cardiorespiratory status by the radiology RN, with a total moderate sedation time of 50 minutes. PROCEDURE: The procedure risks, benefits, and alternatives were explained to the patient. Questions regarding the procedure were encouraged and answered. The patient understands and consents to the procedure. Select axial scans through the upper thorax were obtained. The anterior mediastinal mass was localized and an appropriate skin entry site was determined and marked. The operative field was prepped with chlorhexidinein a sterile fashion, and a sterile drape was applied covering the operative field. A sterile gown and sterile gloves were used for the procedure. Local anesthesia was provided with 1% Lidocaine. Under CT fluoroscopic guidance, a 17 gauge trocar needle was advanced to the margin of the lesion. Once needle tip position was confirmed, coaxial 18-gauge core biopsy samples were obtained, submitted in formalin to surgical pathology. The guide needle was removed. Postprocedure scans show no significant hemorrhage or other apparent complication. No pneumothorax. The patient was then reposition prone. Select axial scans through the retroperitoneum obtained in the left renal mass was localized. An appropriate skin entry site was determined. The operative field was prepped with chlorhexidinein a sterile fashion, and a sterile drape was applied covering the operative field. A sterile gown and sterile gloves were used for the procedure. Local anesthesia was provided with 1% Lidocaine. Under CT fluoroscopic guidance, a 17 gauge trocar needle was advanced to the margin of the lesion. Once needle tip position was confirmed, coaxial 18-gauge core biopsy samples were obtained, submitted in  formalin to surgical pathology. The guide needle was removed. Postprocedure scans show minimal regional retroperitoneal hemorrhage. Patient remained hemodynamically stable. COMPLICATIONS: None immediate FINDINGS: Lobular partially calcified anterior mediastinal mass was localized. Representative core biopsy samples obtained. Slightly hyperdense lower pole left renal mass was localized. Representative core biopsy samples were obtained. IMPRESSION: 1. Technically successful CT-guided core biopsy, anterior mediastinal mass. 2. Technically successful CT-guided core biopsy, left renal mass. Electronically Signed   By: Lucrezia Europe M.D.   On: 03/28/2019 14:26   CT BIOPSY  Result Date: 03/28/2019 CLINICAL DATA:  Monoclonal gammopathy of unknown significance. Left lower pole renal mass. EXAM: CT GUIDED NEEDLE ASPIRATE AND CORE BIOPSY OF RIGHT ILIAC BONE MARROW CT-GUIDED CORE BIOPSY, LEFT RENAL MASS ANESTHESIA/SEDATION: Intravenous Fentanyl 10mcg and Versed 1mg  were administered as conscious  sedation during continuous monitoring of the patient's level of consciousness and physiological / cardiorespiratory status by the radiology RN, with a total moderate sedation time of 50 minutes. PROCEDURE: The procedure risks, benefits, and alternatives were explained to the patient. Questions regarding the procedure were encouraged and answered. The patient understands and consents to the procedure. Select axial scans through the upper thorax were obtained. The anterior mediastinal mass was localized and an appropriate skin entry site was determined and marked. The operative field was prepped with chlorhexidinein a sterile fashion, and a sterile drape was applied covering the operative field. A sterile gown and sterile gloves were used for the procedure. Local anesthesia was provided with 1% Lidocaine. Under CT fluoroscopic guidance, a 17 gauge trocar needle was advanced to the margin of the lesion. Once needle tip position was  confirmed, coaxial 18-gauge core biopsy samples were obtained, submitted in formalin to surgical pathology. The guide needle was removed. Postprocedure scans show no significant hemorrhage or other apparent complication. No pneumothorax. The patient was then reposition prone. Select axial scans through the retroperitoneum obtained in the left renal mass was localized. An appropriate skin entry site was determined. The operative field was prepped with chlorhexidinein a sterile fashion, and a sterile drape was applied covering the operative field. A sterile gown and sterile gloves were used for the procedure. Local anesthesia was provided with 1% Lidocaine. Under CT fluoroscopic guidance, a 17 gauge trocar needle was advanced to the margin of the lesion. Once needle tip position was confirmed, coaxial 18-gauge core biopsy samples were obtained, submitted in formalin to surgical pathology. The guide needle was removed. Postprocedure scans show minimal regional retroperitoneal hemorrhage. Patient remained hemodynamically stable. COMPLICATIONS: None immediate FINDINGS: Lobular partially calcified anterior mediastinal mass was localized. Representative core biopsy samples obtained. Slightly hyperdense lower pole left renal mass was localized. Representative core biopsy samples were obtained. IMPRESSION: 1. Technically successful CT-guided core biopsy, anterior mediastinal mass. 2. Technically successful CT-guided core biopsy, left renal mass. Electronically Signed   By: Lucrezia Europe M.D.   On: 03/28/2019 14:26      ASSESSMENT & PLAN:  1. Thymoma   2. Renal cell cancer, left ([place])   3. MG (myasthenia gravis) ([place])   4. Goals of care, counseling/discussion    The diagnosis of thymoma and care plan were discussed with patient and her sister.   predominantly B2 patent with subsets of a B3 pattern. Thymoma is linked with myasthenia gravis and a paraneoplastic syndromes. Check CBC, CMP, immunoglobulin levels.    Patient has been evaluated by Dr. Genevive Bi and thymoma is not resectable. Discussed with the patient about management of unresectable locally advanced thymoma. Commend concurrent chemotherapy and radiation.  Patient was seen today by radiation oncology, I discussed case with Dr.Chrystal today.    Chemotherapy education was provided.  We had discussed the composition of chemotherapy regimen- Carboplatin and Etoposide Q3 weeks x 2., length of chemo cycle, duration of treatment and the time to assess response to treatment.   I explained to the patient and her sister about the  the risks and benefits of chemotherapy carboplatin and Etoposide [ need dose reduction due to her age and multiple morbidity],  including all but not limited to hair loss, mouth sore, nausea, vomiting, diarrhea, low blood counts, bleeding, neuropathy and risk of life threatening infection and even death, secondary malignancy etc.  . Patient and her sister voices understanding and willing to proceed chemotherapy.   # Chemotherapy education; options of Medi- port  placemen was discussed,sister prefers trying peripheral vein access first, if not feasible, will place medi port. Antiemetics-compazine Supportive care measures are necessary for patient well-being and will be provided as necessary. We spent sufficient time to discuss many aspect of care, questions were answered to patient's satisfaction.  # Left renal cell carcinoma Patient was evaluated by urology. Given her multiple cormobidities, most likely observation vs ablation, etc. She has a follow up appointment with urology to discuss.   # Myasthenia Gravis. Recommend patient to hold methotrexate Dr.Dohmeier, Asencion Partridge.  Continue pyridostigmine    Orders Placed This Encounter  Procedures  . Immunoglobulins, QN, A/E/G/M    Standing Status:   Future    Number of Occurrences:   1    Standing Expiration Date:   04/14/2020  . CBC with Differential/Platelet    Standing Status:    Future    Number of Occurrences:   1    Standing Expiration Date:   04/14/2020    All questions were answered. The patient knows to call the clinic with any problems questions or concerns.  cc Nestor Lewandowsky, MD    Return of visit: to be determined.  Thank you for this kind referral and the opportunity to participate in the care of this patient. A copy of today's note is routed to referring provider      Earlie Server, MD, PhD Hematology Oncology [facility] at [facility] Pager- [phone]

## 2019-04-16 NOTE — Progress Notes (Signed)
START OFF PATHWAY REGIMEN - Other   [id number]:Carboplatin AUC=5 D1 + Etoposide 100 mg/m2 D1-3 q21 Days:   A cycle is every 21 days:     Etoposide      Carboplatin   **Always confirm dose/schedule in your pharmacy ordering system**  Patient Characteristics: Intent of Therapy: Non-Curative / Palliative Intent, Discussed with Patient 

## 2019-04-18 ENCOUNTER — Inpatient Hospital Stay (HOSPITAL_BASED_OUTPATIENT_CLINIC_OR_DEPARTMENT_OTHER): Payer: PPO | Admitting: Oncology

## 2019-04-18 ENCOUNTER — Telehealth: Payer: Self-pay

## 2019-04-18 ENCOUNTER — Other Ambulatory Visit: Payer: Self-pay

## 2019-04-18 ENCOUNTER — Inpatient Hospital Stay: Payer: PPO

## 2019-04-18 DIAGNOSIS — K219 Gastro-esophageal reflux disease without esophagitis: Secondary | ICD-10-CM | POA: Diagnosis not present

## 2019-04-18 DIAGNOSIS — G7000 Myasthenia gravis without (acute) exacerbation: Secondary | ICD-10-CM | POA: Insufficient documentation

## 2019-04-18 DIAGNOSIS — Z809 Family history of malignant neoplasm, unspecified: Secondary | ICD-10-CM | POA: Diagnosis not present

## 2019-04-18 DIAGNOSIS — C642 Malignant neoplasm of left kidney, except renal pelvis: Secondary | ICD-10-CM | POA: Insufficient documentation

## 2019-04-18 DIAGNOSIS — E785 Hyperlipidemia, unspecified: Secondary | ICD-10-CM | POA: Diagnosis not present

## 2019-04-18 DIAGNOSIS — D472 Monoclonal gammopathy: Secondary | ICD-10-CM | POA: Insufficient documentation

## 2019-04-18 DIAGNOSIS — Z800 Family history of malignant neoplasm of digestive organs: Secondary | ICD-10-CM

## 2019-04-18 DIAGNOSIS — E039 Hypothyroidism, unspecified: Secondary | ICD-10-CM

## 2019-04-18 DIAGNOSIS — I10 Essential (primary) hypertension: Secondary | ICD-10-CM

## 2019-04-18 DIAGNOSIS — Z833 Family history of diabetes mellitus: Secondary | ICD-10-CM | POA: Diagnosis not present

## 2019-04-18 DIAGNOSIS — Z79899 Other long term (current) drug therapy: Secondary | ICD-10-CM | POA: Diagnosis not present

## 2019-04-18 DIAGNOSIS — Z8249 Family history of ischemic heart disease and other diseases of the circulatory system: Secondary | ICD-10-CM

## 2019-04-18 DIAGNOSIS — Z5111 Encounter for antineoplastic chemotherapy: Secondary | ICD-10-CM | POA: Insufficient documentation

## 2019-04-18 DIAGNOSIS — Z510 Encounter for antineoplastic radiation therapy: Secondary | ICD-10-CM | POA: Insufficient documentation

## 2019-04-18 DIAGNOSIS — M069 Rheumatoid arthritis, unspecified: Secondary | ICD-10-CM | POA: Diagnosis not present

## 2019-04-18 DIAGNOSIS — Z85820 Personal history of malignant melanoma of skin: Secondary | ICD-10-CM

## 2019-04-18 DIAGNOSIS — D4989 Neoplasm of unspecified behavior of other specified sites: Secondary | ICD-10-CM

## 2019-04-18 DIAGNOSIS — C37 Malignant neoplasm of thymus: Secondary | ICD-10-CM | POA: Insufficient documentation

## 2019-04-18 LAB — IMMUNOGLOBULINS A/E/G/M, SERUM
IgA: 99 mg/dL (ref 87–352)
IgE (Immunoglobulin E), Serum: 4 IU/mL — ABNORMAL LOW (ref 6–495)
IgG (Immunoglobin G), Serum: 877 mg/dL (ref 586–1602)
IgM (Immunoglobulin M), Srm: 109 mg/dL (ref 26–217)

## 2019-04-18 NOTE — Progress Notes (Signed)
  Virtual Visit via Telephone Note  I connected with Madison Burns on 04/19/2019 at  2:00 PM EST by telephone and verified that I am speaking with the correct person using two identifiers.  Location: Patient: At home, primarily talked with Madison Burns (her sister) proxy and Madison Burns was present  Provider: In office    I discussed the limitations, risks, security and privacy concerns of performing an evaluation and management service by telephone and the availability of in person appointments. I also discussed with the patient that there may be a patient responsible charge related to this service. The patient expressed understanding and agreed to proceed.   History of Present Illness: Madison Burns is a 69 yo F with a personal history of developmental delay, myasthenia gravis, mediastinal mass returns today for further evaluation of her renal mass.   Madison Burns initially underwent a CT abdomen pelvis with contrast on 03/09/2018 for worsening umbilical pain.  This revealed an incidental possible 3 cm renal mass on the left, however there was significant artifact appreciated secondary to motion.  This is followed up with an MRCP performed for the purpose of right upper quadrant pain as well as the renal mass in question.  MRI performed on 02/20/2018 showed a 3.2 x 2.6 left lower pole endophytic renal mass which was felt to possibly be enhancing and consistent with possible renal cell carcinoma.  She underwent a CT biopsy on 03/28/2019.Biopsy consistent with renal cell carcinoma.   She also was diagnosed with symptomatic thymoma unresectable by Dr. Genevive Bi. Now considered for chemo and radiation followed by Dr. Tasia Catchings. See previous note for details. She has planning for procedure tomorrow with Dr. Baruch Gouty and has had chemotherapy class. Pt reports of chemo once a week for 6 weeks. Pt is not sure if she is going to get port.   No personal or family history of renal cell carcinoma  Observations/Objective: Primary  discussion with sister, health care power of attoney  Assessment and Plan:  1. Left Renal Cell Carcinoma  Offered surveillance vs cryoablation with the associated risk and benefits  Not good candidate for operative management given multiple comorbidities and myasthenia gravis Explained the risks regarding the complications with recovery from anesthesia   Pt not sure about port; will f/u with Dr. Tasia Catchings to clarify evaluate treatment plan  Leaning toward definitive management of mass ie. treatment and will probably do this after completion of chemo-radiation after she's covered Will need anesthesia consult prior to any intervention due to her hx of myasthenia gravis  May improve post-treatment thymoma  If port requiresd then will go again with cryoablation but If port not required, will reimage in April after recover and decide for cryo at that time or continued surveillance  Follow Up Instructions: April for CT abd/ pelvis   I discussed the assessment and treatment plan with the patient. The patient was provided an opportunity to ask questions and all were answered. The patient agreed with the plan and demonstrated an understanding of the instructions.   The patient was advised to call back or seek an in-person evaluation if the symptoms worsen or if the condition fails to improve as anticipated.  I provided 16 minutes of non-face-to-face time during this encounter.   Dolores Frame, am acting as a scribe for Dr. Hollice Espy,  I have reviewed the above documentation for accuracy and completeness, and I agree with the above.   Hollice Espy, MD

## 2019-04-18 NOTE — Telephone Encounter (Signed)
Per Dr. Tasia Catchings:  Please advise patient to stop Methotrexate when she starts chemotherapy, and also ask sister to update Dohmeier, Asencion Partridge, MD - who is her neurologist. thank you. i could not reach sister this morning.   I have attempted to contact sister, but unable to reach her.

## 2019-04-18 NOTE — Telephone Encounter (Signed)
Telephone call to [place], pt sister but no answer.  Left voice mail about change in chemotherapy.  Pt is not getting Taxol but Etoposide instead and I explained the drug and specific side effects.   Also Etoposide is given 3 days straight in a row.   Also left message for pt to stop Methotrexate when starting chemotherapy.  Encouraged sister to call me back for further questions.

## 2019-04-18 NOTE — Progress Notes (Signed)
[place]  Telephone:([phone] [phone]  Patient Care Team: Einar Pheasant, MD as PCP - General (Internal Medicine)   Name of the patient: Madison Burns  DC:[id number]  October 07, 1950   Date of visit: 04/18/19  Diagnosis-thymoma  Chief complaint/Reason for visit- Initial Meeting for [facility], preparing for starting chemotherapy  I connected with Madison Burns on 04/18/19 at 11:00 AM EST by telephone visit and verified that I am speaking with the correct person using two identifiers.   I discussed the limitations, risks, security and privacy concerns of performing an evaluation and management service by telemedicine and the availability of in-person appointments. I also discussed with the patient that there may be a patient responsible charge related to this service. The patient expressed understanding and agreed to proceed.   Other persons participating in the visit and their role in the encounter: sister  Patient's location: Home Provider's location: Office   Heme/Onc history:  Oncology History  Renal cell cancer, left ([place])  03/09/2019 Initial Diagnosis   Renal cell cancer, left ([place])   04/16/2019 Cancer Staging   Staging form: Kidney, AJCC 8th Edition - Clinical: Stage I (cT1a, cN0, cM0) - Signed by Earlie Server, MD on 04/16/2019     Interval history-Madison Burns is a 69 year old female who presents to chemo care clinic today for initial meeting in preparation for starting chemotherapy. I introduced the chemo care clinic and we discussed that the role of the clinic is to assist those who are at an increased risk of emergency room visits and/or complications during the course of chemotherapy treatment. We discussed that the increased risk takes into account factors such as age, performance status, and co-morbidities. We also discussed that for some, this might include barriers to care such as not having a  primary care provider, lack of insurance/transportation, or not being able to afford medications. We discussed that the goal of the program is to help prevent unplanned ER visits and help reduce complications during chemotherapy. We do this by discussing specific risk factors to each individual and identifying ways that we can help improve these risk factors and reduce barriers to care.   ECOG FS:1 - Symptomatic but completely ambulatory  Review of systems- Review of Systems  Constitutional: Negative.  Negative for chills, fever, malaise/fatigue and weight loss.       Intellectual disability  HENT: Negative for congestion, ear pain and tinnitus.   Eyes: Negative.  Negative for blurred vision and double vision.  Respiratory: Negative.  Negative for cough, sputum production and shortness of breath.   Cardiovascular: Negative.  Negative for chest pain, palpitations and leg swelling.  Gastrointestinal: Negative.  Negative for abdominal pain, constipation, diarrhea, nausea and vomiting.  Genitourinary: Negative for dysuria, frequency and urgency.  Musculoskeletal: Negative for back pain and falls.  Skin: Negative.  Negative for rash.  Neurological: Negative.  Negative for weakness and headaches.  Endo/Heme/Allergies: Negative.  Does not bruise/bleed easily.  Psychiatric/Behavioral: Negative.  Negative for depression. The patient is not nervous/anxious and does not have insomnia.      Current treatment-beginning treatment with carboplatinum and etoposide possibly next week.  Allergies  Allergen Reactions  . Ciprofloxacin Other (See Comments)    Sister denies  . Levofloxacin Other (See Comments)    Sister denies    Past Medical History:  Diagnosis Date  . Cancer of kidney ([place]) 2021  . GERD (gastroesophageal reflux disease)   . History of seizure  disorder   . Hypercholesterolemia   . Hypertension   . Irritable bowel syndrome   . Myasthenia gravis ([place]) 2011  . OSA on CPAP   . Skin  cancer 2017   Melanoma on neck    Past Surgical History:  Procedure Laterality Date  . ABDOMINAL HYSTERECTOMY  1990  . APPENDECTOMY  1999  . CHOLECYSTECTOMY  2013  . COLONOSCOPY WITH PROPOFOL N/A 06/30/2017   Procedure: COLONOSCOPY WITH PROPOFOL;  Surgeon: Toledo, Benay Pike, MD;  Location: [facility] ENDOSCOPY;  Service: Gastroenterology;  Laterality: N/A;  . ESOPHAGOGASTRODUODENOSCOPY (EGD) WITH PROPOFOL N/A 06/30/2017   Procedure: ESOPHAGOGASTRODUODENOSCOPY (EGD) WITH PROPOFOL;  Surgeon: Toledo, Benay Pike, MD;  Location: [facility] ENDOSCOPY;  Service: Gastroenterology;  Laterality: N/A;  . TONSILLECTOMY  1959    Social History   Socioeconomic History  . Marital status: Single    Spouse name: Not on file  . Number of children: 0  . Years of education: Special Ed  . Highest education level: Not on file  Occupational History  . Occupation: Unemployed  Tobacco Use  . Smoking status: Never Smoker  . Smokeless tobacco: Never Used  Substance and Sexual Activity  . Alcohol use: No    Alcohol/week: 0.0 standard drinks  . Drug use: No  . Sexual activity: Not Currently  Other Topics Concern  . Not on file  Social History Narrative   06/23/17 lives with sister, Madison Burns   Regular exercise-no   Caffeine Use-yes   Social Determinants of Health   Financial Resource Strain:   . Difficulty of Paying Living Expenses: Not on file  Food Insecurity:   . Worried About Charity fundraiser in the Last Year: Not on file  . Ran Out of Food in the Last Year: Not on file  Transportation Needs:   . Lack of Transportation (Medical): Not on file  . Lack of Transportation (Non-Medical): Not on file  Physical Activity:   . Days of Exercise per Week: Not on file  . Minutes of Exercise per Session: Not on file  Stress:   . Feeling of Stress : Not on file  Social Connections:   . Frequency of Communication with Friends and Family: Not on file  . Frequency of Social Gatherings with Friends and Family: Not on  file  . Attends Religious Services: Not on file  . Active Member of Clubs or Organizations: Not on file  . Attends Archivist Meetings: Not on file  . Marital Status: Not on file  Intimate Partner Violence:   . Fear of Current or Ex-Partner: Not on file  . Emotionally Abused: Not on file  . Physically Abused: Not on file  . Sexually Abused: Not on file    Family History  Problem Relation Age of Onset  . Colon cancer Maternal Grandfather        stomach/colon  . Heart disease Father        myocardial infarction  . Hyperlipidemia Father   . Hyperlipidemia Sister   . Diabetes Sister   . Bone cancer Other        cousin  . Alzheimer's disease Mother        maternal aunts  . Skin cancer Mother   . Myasthenia gravis Brother        ocular  . Skin cancer Brother   . Stroke Paternal Grandfather   . Breast cancer Neg Hx      Current Outpatient Medications:  .  benzonatate (TESSALON) 100 MG capsule, Take  1 capsule (100 mg total) by mouth 2 (two) times daily as needed for cough., Disp: 21 capsule, Rfl: 0 .  dexamethasone (DECADRON) 4 MG tablet, Take 2 tablets (8 mg total) by mouth daily. Start the day after chemotherapy for 1 day., Disp: 30 tablet, Rfl: 1 .  folic acid (FOLVITE) 1 MG tablet, Take 2 tablets (2 mg total) by mouth daily., Disp: 180 tablet, Rfl: 3 .  levothyroxine (SYNTHROID) 75 MCG tablet, TAKE 1 TABLET BY MOUTH ONCE DAILY ON AN EMPTY STOMACH. WAIT 30 MINUTES BEFORE TAKING OTHER MEDS., Disp: 90 tablet, Rfl: 1 .  methotrexate (RHEUMATREX) 2.5 MG tablet, Take 4 tablets (10 mg total) by mouth once a week. Caution: Protect from light., Disp: 16 tablet, Rfl: 3 .  ondansetron (ZOFRAN) 8 MG tablet, Take 1 tablet (8 mg total) by mouth 2 (two) times daily as needed for refractory nausea / vomiting. Start on day 3 after carboplatin chemo., Disp: 30 tablet, Rfl: 1 .  pantoprazole (PROTONIX) 40 MG tablet, 40 mg daily., Disp: , Rfl:  .  prochlorperazine (COMPAZINE) 10 MG  tablet, Take 1 tablet (10 mg total) by mouth every 6 (six) hours as needed (Nausea or vomiting)., Disp: 30 tablet, Rfl: 1 .  pyridostigmine (MESTINON) 60 MG tablet, TAKE 1 TABLET BY MOUTH AT  8 AM, ONE AT LUNCH AND ONE AT 8PM, Disp: 90 tablet, Rfl: 6 .  risperiDONE (RISPERDAL) 2 MG tablet, , Disp: , Rfl:  .  sucralfate (CARAFATE) 1 g tablet, Take 1 g by mouth 2 (two) times daily., Disp: , Rfl:  .  tolterodine (DETROL Burns) 4 MG 24 hr capsule, TAKE 1 CAPSULE BY MOUTH ONCE EVERY MORNING, Disp: 90 capsule, Rfl: 1  Physical exam: There were no vitals filed for this visit. Physical Exam  Limited due to virtual platform.  CMP Latest Ref Rng & Units 04/15/2019  Glucose 70 - 99 mg/dL 103(H)  BUN 8 - 23 mg/dL 24(H)  Creatinine 0.44 - 1.00 mg/dL 0.79  Sodium 135 - 145 mmol/L 140  Potassium 3.5 - 5.1 mmol/L 3.8  Chloride 98 - 111 mmol/L 105  CO2 22 - 32 mmol/L 24  Calcium 8.9 - 10.3 mg/dL 9.3  Total Protein 6.5 - 8.1 g/dL 7.2  Total Bilirubin 0.3 - 1.2 mg/dL 0.6  Alkaline Phos 38 - 126 U/L 52  AST 15 - 41 U/L 27  ALT 0 - 44 U/L 21   CBC Latest Ref Rng & Units 04/15/2019  WBC 4.0 - 10.5 K/uL 10.1  Hemoglobin 12.0 - 15.0 g/dL 14.6  Hematocrit 36.0 - 46.0 % 44.9  Platelets 150 - 400 K/uL 178    No images are attached to the encounter.  CT BIOPSY  Result Date: 03/28/2019 CLINICAL DATA:  Monoclonal gammopathy of unknown significance. Left lower pole renal mass. EXAM: CT GUIDED NEEDLE ASPIRATE AND CORE BIOPSY OF RIGHT ILIAC BONE MARROW CT-GUIDED CORE BIOPSY, LEFT RENAL MASS ANESTHESIA/SEDATION: Intravenous Fentanyl 54mcg and Versed 1mg  were administered as conscious sedation during continuous monitoring of the patient's level of consciousness and physiological / cardiorespiratory status by the radiology RN, with a total moderate sedation time of 50 minutes. PROCEDURE: The procedure risks, benefits, and alternatives were explained to the patient. Questions regarding the procedure were encouraged and  answered. The patient understands and consents to the procedure. Select axial scans through the upper thorax were obtained. The anterior mediastinal mass was localized and an appropriate skin entry site was determined and marked. The operative field was prepped with chlorhexidinein  a sterile fashion, and a sterile drape was applied covering the operative field. A sterile gown and sterile gloves were used for the procedure. Local anesthesia was provided with 1% Lidocaine. Under CT fluoroscopic guidance, a 17 gauge trocar needle was advanced to the margin of the lesion. Once needle tip position was confirmed, coaxial 18-gauge core biopsy samples were obtained, submitted in formalin to surgical pathology. The guide needle was removed. Postprocedure scans show no significant hemorrhage or other apparent complication. No pneumothorax. The patient was then reposition prone. Select axial scans through the retroperitoneum obtained in the left renal mass was localized. An appropriate skin entry site was determined. The operative field was prepped with chlorhexidinein a sterile fashion, and a sterile drape was applied covering the operative field. A sterile gown and sterile gloves were used for the procedure. Local anesthesia was provided with 1% Lidocaine. Under CT fluoroscopic guidance, a 17 gauge trocar needle was advanced to the margin of the lesion. Once needle tip position was confirmed, coaxial 18-gauge core biopsy samples were obtained, submitted in formalin to surgical pathology. The guide needle was removed. Postprocedure scans show minimal regional retroperitoneal hemorrhage. Patient remained hemodynamically stable. COMPLICATIONS: None immediate FINDINGS: Lobular partially calcified anterior mediastinal mass was localized. Representative core biopsy samples obtained. Slightly hyperdense lower pole left renal mass was localized. Representative core biopsy samples were obtained. IMPRESSION: 1. Technically successful  CT-guided core biopsy, anterior mediastinal mass. 2. Technically successful CT-guided core biopsy, left renal mass. Electronically Signed   By: Lucrezia Europe M.D.   On: 03/28/2019 14:26   CT BIOPSY  Result Date: 03/28/2019 CLINICAL DATA:  Monoclonal gammopathy of unknown significance. Left lower pole renal mass. EXAM: CT GUIDED NEEDLE ASPIRATE AND CORE BIOPSY OF RIGHT ILIAC BONE MARROW CT-GUIDED CORE BIOPSY, LEFT RENAL MASS ANESTHESIA/SEDATION: Intravenous Fentanyl 73mcg and Versed 1mg  were administered as conscious sedation during continuous monitoring of the patient's level of consciousness and physiological / cardiorespiratory status by the radiology RN, with a total moderate sedation time of 50 minutes. PROCEDURE: The procedure risks, benefits, and alternatives were explained to the patient. Questions regarding the procedure were encouraged and answered. The patient understands and consents to the procedure. Select axial scans through the upper thorax were obtained. The anterior mediastinal mass was localized and an appropriate skin entry site was determined and marked. The operative field was prepped with chlorhexidinein a sterile fashion, and a sterile drape was applied covering the operative field. A sterile gown and sterile gloves were used for the procedure. Local anesthesia was provided with 1% Lidocaine. Under CT fluoroscopic guidance, a 17 gauge trocar needle was advanced to the margin of the lesion. Once needle tip position was confirmed, coaxial 18-gauge core biopsy samples were obtained, submitted in formalin to surgical pathology. The guide needle was removed. Postprocedure scans show no significant hemorrhage or other apparent complication. No pneumothorax. The patient was then reposition prone. Select axial scans through the retroperitoneum obtained in the left renal mass was localized. An appropriate skin entry site was determined. The operative field was prepped with chlorhexidinein a sterile  fashion, and a sterile drape was applied covering the operative field. A sterile gown and sterile gloves were used for the procedure. Local anesthesia was provided with 1% Lidocaine. Under CT fluoroscopic guidance, a 17 gauge trocar needle was advanced to the margin of the lesion. Once needle tip position was confirmed, coaxial 18-gauge core biopsy samples were obtained, submitted in formalin to surgical pathology. The guide needle was removed. Postprocedure scans show  minimal regional retroperitoneal hemorrhage. Patient remained hemodynamically stable. COMPLICATIONS: None immediate FINDINGS: Lobular partially calcified anterior mediastinal mass was localized. Representative core biopsy samples obtained. Slightly hyperdense lower pole left renal mass was localized. Representative core biopsy samples were obtained. IMPRESSION: 1. Technically successful CT-guided core biopsy, anterior mediastinal mass. 2. Technically successful CT-guided core biopsy, left renal mass. Electronically Signed   By: Lucrezia Europe M.D.   On: 03/28/2019 14:26   Assessment and plan- Patient is a 69 y.o. female who presents to [facility] for initial meeting in preparation for starting chemotherapy for the treatment of carbo/etoposide.   1. HPI: Patient is a 70 year old female with myasthenia gravis who was found on recent CT scan to have an anterior mediastinal mass.  She was also noted to have a kidney lesion.  Left kidney mass was biopsied and positive for renal cell carcinoma.  She had CT-guided biopsy of anterior mediastinal mass which showed positivity for thymoma predominantly B12 pattern with subset of B3 pattern.  CT scan of the chest demonstrated lobulated internal calcified mass.  There is also an adjacent pleural nodularity around the right upper lobe.  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  She complained of minimal swallowing problems which according to sister have been present for years.   She denied hemoptysis or chest tightness.  She was evaluated by Dr. Faith Rogue and thought not to be a good surgical candidate based on extent of disease.  She was referred to radiation oncology and medical oncology for further evaluation and treatment options.  2. Chemo Care Clinic/High Risk for ER/Hospitalization during chemotherapy- We discussed the role of the chemo care clinic and identified patient specific risk factors. I discussed that patient was identified as high risk primarily based on: Stage of disease.  Patient has past medical history positive for: Past Medical History:  Diagnosis Date  . Cancer of kidney ([place]) 2021  . GERD (gastroesophageal reflux disease)   . History of seizure disorder   . Hypercholesterolemia   . Hypertension   . Irritable bowel syndrome   . Myasthenia gravis ([place]) 2011  . OSA on CPAP   . Skin cancer 2017   Melanoma on neck    Patient has past surgical history positive for: Past Surgical History:  Procedure Laterality Date  . ABDOMINAL HYSTERECTOMY  1990  . APPENDECTOMY  1999  . CHOLECYSTECTOMY  2013  . COLONOSCOPY WITH PROPOFOL N/A 06/30/2017   Procedure: COLONOSCOPY WITH PROPOFOL;  Surgeon: Toledo, Benay Pike, MD;  Location: [facility] ENDOSCOPY;  Service: Gastroenterology;  Laterality: N/A;  . ESOPHAGOGASTRODUODENOSCOPY (EGD) WITH PROPOFOL N/A 06/30/2017   Procedure: ESOPHAGOGASTRODUODENOSCOPY (EGD) WITH PROPOFOL;  Surgeon: Toledo, Benay Pike, MD;  Location: [facility] ENDOSCOPY;  Service: Gastroenterology;  Laterality: N/A;  . TONSILLECTOMY  1959    Based on our high risk symptom management report; this patient has a high risk of ED utilization.  The percentage below indicates how "at risk "  this patient based on the factors in this table within one year.   General Risk Score: 4  Values used to calculate this score:   Points  Metrics      1        Age: [facility] Admissions: 0      2        ED Visits: 2      0        Has Chronic Obstructive  Pulmonary Disease: No      1        Has Diabetes: Yes      0        Has Congestive Heart Failure: No      0        Has liver disease: No      0        Has Depression: No      0        Current PCP: Einar Pheasant, MD      0        Has Medicaid: No   3. We discussed that social determinants of health may have significant impacts on health and outcomes for cancer patients.  Today we discussed specific social determinants of performance status, alcohol use, depression, financial needs, food insecurity, housing, interpersonal violence, social connections, stress, tobacco use, and transportation.    After lengthy discussion the following were identified as areas of need: None at this time.   Outpatient services: We discussed options including home based and outpatient services, DME and care program. We discusssed that patients who participate in regular physical activity report fewer negative impacts of cancer and treatments and report less fatigue.   Financial Concerns: We discussed that living with cancer can create tremendous financial burden.  We discussed options for assistance. I asked that if assistance is needed in affording medications or paying bills to please let us know so that we can provide assistance. We discussed options for food including social services, [employer] ($50 every 2 weeks) and onsite food pantry. We also offer gas vouchers($20 per week) that are based on a first come first serve basis. We will also notify [facility] to see if cancer center can provide additional support.  Referral to Social work: Introduced Education officer, museum Elease Etienne and the services he can provide such as support with [employer], cell phone and gas vouchers.   Support groups: We discussed options for support groups at the cancer center. If interested, please notify nurse navigator to enroll. We discussed options for managing stress including healthy eating, exercise as well as participating  in no charge counseling services at the cancer center and support groups.  If these are of interest, patient can notify either myself or primary nursing team.We discussed options for management including medications and referral to quit Smart program  Transportation: We discussed options for transportation including acta, paratransit, bus routes, link transit, taxi/uber/lyft, and cancer center [place].  I have notified primary oncology team who will help assist with arranging Lucianne Lei transportation for appointments when/if needed. We also discussed options for transportation on short notice/acute visits.  Palliative care services: We have palliative care services available in the cancer center to discuss goals of care and advanced care planning.  Please let us know if you have any questions or would like to speak to our palliative nurse practitioner.  Symptom Management Clinic: We discussed our symptom management clinic which is available for acute concerns while receiving treatment such as nausea, vomiting or diarrhea.  We can be reached via telephone at [phone] or through my chart.  We are available for virtual or in person visits on the same day from 830 to 4 PM Monday through Friday. She denies needing specific assistance at this time and She will be followed by Dr. Collie Siad clinical team.  Plan: Discussed symptom management clinic. Discussed palliative care services. Discussed resources that are available here at the cancer center. Discussed medications and  new prescriptions to begin treatment such as anti-nausea or steroids.   Disposition: RTC on 04/19/19 to see Dr. Erlene Quan.  RTC on 04/20/19 for Simulation for radiation.   Visit Diagnosis 1. Thymoma   2. Renal cell cancer, left [facility])     Patient expressed understanding and was in agreement with this plan. She also understands that She can call clinic at any time with any questions, concerns, or complaints.   I provided 15 minutes of non face-to-face  telephone visit time during this encounter, and > 50% was spent counseling as documented under my assessment & plan.   [place] [facility] [place]  CC: Dr. Tasia Catchings

## 2019-04-19 ENCOUNTER — Telehealth (INDEPENDENT_AMBULATORY_CARE_PROVIDER_SITE_OTHER): Payer: PPO | Admitting: Urology

## 2019-04-19 DIAGNOSIS — C642 Malignant neoplasm of left kidney, except renal pelvis: Secondary | ICD-10-CM

## 2019-04-19 DIAGNOSIS — D2262 Melanocytic nevi of left upper limb, including shoulder: Secondary | ICD-10-CM | POA: Diagnosis not present

## 2019-04-19 DIAGNOSIS — L578 Other skin changes due to chronic exposure to nonionizing radiation: Secondary | ICD-10-CM | POA: Diagnosis not present

## 2019-04-19 DIAGNOSIS — D485 Neoplasm of uncertain behavior of skin: Secondary | ICD-10-CM | POA: Diagnosis not present

## 2019-04-19 DIAGNOSIS — Z85820 Personal history of malignant melanoma of skin: Secondary | ICD-10-CM | POA: Diagnosis not present

## 2019-04-19 DIAGNOSIS — N2889 Other specified disorders of kidney and ureter: Secondary | ICD-10-CM

## 2019-04-20 ENCOUNTER — Telehealth: Payer: Self-pay | Admitting: Neurology

## 2019-04-20 ENCOUNTER — Ambulatory Visit
Admission: RE | Admit: 2019-04-20 | Discharge: 2019-04-20 | Disposition: A | Discharge status: Home / Self Care | Payer: PPO | Source: Ambulatory Visit | Attending: Radiation Oncology | Admitting: Radiation Oncology

## 2019-04-20 ENCOUNTER — Other Ambulatory Visit: Payer: Self-pay

## 2019-04-20 DIAGNOSIS — Z5111 Encounter for antineoplastic chemotherapy: Secondary | ICD-10-CM | POA: Diagnosis not present

## 2019-04-20 DIAGNOSIS — D472 Monoclonal gammopathy: Secondary | ICD-10-CM | POA: Diagnosis not present

## 2019-04-20 DIAGNOSIS — C37 Malignant neoplasm of thymus: Secondary | ICD-10-CM | POA: Diagnosis not present

## 2019-04-20 DIAGNOSIS — Z85820 Personal history of malignant melanoma of skin: Secondary | ICD-10-CM | POA: Diagnosis not present

## 2019-04-20 DIAGNOSIS — Z510 Encounter for antineoplastic radiation therapy: Secondary | ICD-10-CM | POA: Diagnosis not present

## 2019-04-20 DIAGNOSIS — C642 Malignant neoplasm of left kidney, except renal pelvis: Secondary | ICD-10-CM | POA: Diagnosis not present

## 2019-04-20 DIAGNOSIS — G7000 Myasthenia gravis without (acute) exacerbation: Secondary | ICD-10-CM | POA: Diagnosis not present

## 2019-04-20 NOTE — Telephone Encounter (Signed)
I was able to reach patient's sister, [name], and notifiy her about stopping methotrexate and she voiced understanding. She is going to contact Dr. Brett Fairy this afternoon. Also notified her in chemotherapy change.   She is scheduled for dry run on 3/10 and starts daily radiation on 3/11. Please advise on scheduling chemo.

## 2019-04-20 NOTE — Telephone Encounter (Signed)
Drollinger,Nancy(sister ) has called to inform that since pt is having issues re: her cancer pt is needing to come off of her   methotrexate (RHEUMATREX) 2.5 MG tablet  .  Please call to discuss

## 2019-04-21 ENCOUNTER — Other Ambulatory Visit: Payer: PPO

## 2019-04-21 NOTE — Telephone Encounter (Signed)
Called the patient and advised I discussed with Dr Brett Fairy and she is ok with the patient come off the medication. Advised that she should look for signs of a exacerbation such as hard to open eyes, difficulty swallowing. Advised to reach out if these symptoms are worsening since coming off the medication.

## 2019-04-21 NOTE — Telephone Encounter (Signed)
Called the patient's sister to discuss in detail with her why needing to come off the medication. The sister states Dr Tasia Catchings is wanting the patient to come off the medication while she is on chemo. She will be on chemo for 6 weeks. Dr Tasia Catchings is wanting Dr Dohmeier to be aware that she will have to come off of the methotrexate and also sister is concerned if there may be problems associated with coming off. She just wanted to verify it would be safe to do so and should she be concerned of any decline in health if she did. Advised I would discuss with Dr Brett Fairy and then I will call her back. She was appreciative.

## 2019-04-22 ENCOUNTER — Other Ambulatory Visit: Payer: Self-pay

## 2019-04-22 ENCOUNTER — Ambulatory Visit: Payer: PPO | Attending: Internal Medicine

## 2019-04-22 ENCOUNTER — Other Ambulatory Visit: Payer: PPO

## 2019-04-22 DIAGNOSIS — Z20822 Contact with and (suspected) exposure to covid-19: Secondary | ICD-10-CM

## 2019-04-22 DIAGNOSIS — C642 Malignant neoplasm of left kidney, except renal pelvis: Secondary | ICD-10-CM

## 2019-04-22 NOTE — Telephone Encounter (Signed)
Please schedule patient for lab/MD/carbo/etop *NEW* on 3/10 etop day 2 & day 3 I can call sister with appts once scheduled.

## 2019-04-22 NOTE — Addendum Note (Signed)
Addended by: Faythe Casa E on: 04/22/2019 12:43 PM   Modules accepted: Level of Service

## 2019-04-22 NOTE — Telephone Encounter (Signed)
Notified patient's ,POA/sister, about appointment & also that she will need covid testing. Madison Burns will get covid testing done this afternoon, scheduled at 4pm.

## 2019-04-22 NOTE — Telephone Encounter (Signed)
Done.... the patient has been scheduled as requested Lab/MD/**NEW Carbo/Etop on 3/10 starting @ 815 day 1 Etop day 2 & 3 @ 115

## 2019-04-23 LAB — NOVEL CORONAVIRUS, NAA: SARS-CoV-2, NAA: NOT DETECTED

## 2019-04-23 LAB — SPECIMEN STATUS REPORT

## 2019-04-26 ENCOUNTER — Other Ambulatory Visit: Payer: Self-pay | Admitting: Internal Medicine

## 2019-04-26 DIAGNOSIS — Z510 Encounter for antineoplastic radiation therapy: Secondary | ICD-10-CM | POA: Diagnosis not present

## 2019-04-26 DIAGNOSIS — C37 Malignant neoplasm of thymus: Secondary | ICD-10-CM | POA: Diagnosis not present

## 2019-04-27 ENCOUNTER — Inpatient Hospital Stay: Payer: PPO

## 2019-04-27 ENCOUNTER — Other Ambulatory Visit: Payer: Self-pay

## 2019-04-27 ENCOUNTER — Ambulatory Visit: Admission: RE | Admit: 2019-04-27 | Payer: PPO | Source: Ambulatory Visit

## 2019-04-27 ENCOUNTER — Inpatient Hospital Stay: Payer: PPO | Admitting: Oncology

## 2019-04-27 ENCOUNTER — Encounter: Payer: Self-pay | Admitting: Oncology

## 2019-04-27 VITALS — BP 113/74 | HR 70 | Resp 18

## 2019-04-27 VITALS — BP 120/70 | HR 79 | Temp 96.0°F | Resp 18 | Wt 215.0 lb

## 2019-04-27 DIAGNOSIS — Z5111 Encounter for antineoplastic chemotherapy: Secondary | ICD-10-CM | POA: Diagnosis not present

## 2019-04-27 DIAGNOSIS — G7000 Myasthenia gravis without (acute) exacerbation: Secondary | ICD-10-CM | POA: Diagnosis not present

## 2019-04-27 DIAGNOSIS — D4989 Neoplasm of unspecified behavior of other specified sites: Secondary | ICD-10-CM

## 2019-04-27 DIAGNOSIS — Z510 Encounter for antineoplastic radiation therapy: Secondary | ICD-10-CM | POA: Insufficient documentation

## 2019-04-27 DIAGNOSIS — C37 Malignant neoplasm of thymus: Secondary | ICD-10-CM | POA: Insufficient documentation

## 2019-04-27 DIAGNOSIS — C642 Malignant neoplasm of left kidney, except renal pelvis: Secondary | ICD-10-CM

## 2019-04-27 LAB — CBC WITH DIFFERENTIAL/PLATELET
Abs Immature Granulocytes: 0.02 10*3/uL (ref 0.00–0.07)
Basophils Absolute: 0.1 10*3/uL (ref 0.0–0.1)
Basophils Relative: 1 %
Eosinophils Absolute: 0.2 10*3/uL (ref 0.0–0.5)
Eosinophils Relative: 2 %
HCT: 41.6 % (ref 36.0–46.0)
Hemoglobin: 13.2 g/dL (ref 12.0–15.0)
Immature Granulocytes: 0 %
Lymphocytes Relative: 22 %
Lymphs Abs: 1.9 10*3/uL (ref 0.7–4.0)
MCH: 30.8 pg (ref 26.0–34.0)
MCHC: 31.7 g/dL (ref 30.0–36.0)
MCV: 97.2 fL (ref 80.0–100.0)
Monocytes Absolute: 0.5 10*3/uL (ref 0.1–1.0)
Monocytes Relative: 6 %
Neutro Abs: 6.1 10*3/uL (ref 1.7–7.7)
Neutrophils Relative %: 69 %
Platelets: 198 10*3/uL (ref 150–400)
RBC: 4.28 MIL/uL (ref 3.87–5.11)
RDW: 14.5 % (ref 11.5–15.5)
WBC: 8.8 10*3/uL (ref 4.0–10.5)
nRBC: 0 % (ref 0.0–0.2)

## 2019-04-27 LAB — COMPREHENSIVE METABOLIC PANEL
ALT: 22 U/L (ref 0–44)
AST: 24 U/L (ref 15–41)
Albumin: 3.5 g/dL (ref 3.5–5.0)
Alkaline Phosphatase: 80 U/L (ref 38–126)
Anion gap: 10 (ref 5–15)
BUN: 17 mg/dL (ref 8–23)
CO2: 25 mmol/L (ref 22–32)
Calcium: 8.5 mg/dL — ABNORMAL LOW (ref 8.9–10.3)
Chloride: 105 mmol/L (ref 98–111)
Creatinine, Ser: 0.86 mg/dL (ref 0.44–1.00)
GFR calc Af Amer: >60 mL/min (ref >60)
GFR calc non Af Amer: >60 mL/min (ref >60)
Glucose, Bld: 103 mg/dL — ABNORMAL HIGH (ref 70–99)
Potassium: 3.7 mmol/L (ref 3.5–5.1)
Sodium: 140 mmol/L (ref 135–145)
Total Bilirubin: 0.4 mg/dL (ref 0.3–1.2)
Total Protein: 6.4 g/dL — ABNORMAL LOW (ref 6.5–8.1)

## 2019-04-27 MED ORDER — SODIUM CHLORIDE 0.9 % IV SOLN
75.0000 mg/m2 | Freq: Once | INTRAVENOUS | Status: AC
  Administered 2019-04-27: 150 mg via INTRAVENOUS
  Filled 2019-04-27: qty 7.5

## 2019-04-27 MED ORDER — SODIUM CHLORIDE 0.9 % IV SOLN
422.8000 mg | Freq: Once | INTRAVENOUS | Status: AC
  Administered 2019-04-27: 420 mg via INTRAVENOUS
  Filled 2019-04-27: qty 42

## 2019-04-27 MED ORDER — SODIUM CHLORIDE 0.9 % IV SOLN
10.0000 mg | Freq: Once | INTRAVENOUS | Status: AC
  Administered 2019-04-27: 10 mg via INTRAVENOUS
  Filled 2019-04-27: qty 10

## 2019-04-27 MED ORDER — SODIUM CHLORIDE 0.9 % IV SOLN
150.0000 mg | Freq: Once | INTRAVENOUS | Status: AC
  Administered 2019-04-27: 150 mg via INTRAVENOUS
  Filled 2019-04-27: qty 150

## 2019-04-27 MED ORDER — PALONOSETRON HCL INJECTION 0.25 MG/5ML
0.2500 mg | Freq: Once | INTRAVENOUS | Status: AC
  Administered 2019-04-27: 0.25 mg via INTRAVENOUS
  Filled 2019-04-27: qty 5

## 2019-04-27 MED ORDER — SODIUM CHLORIDE 0.9 % IV SOLN
Freq: Once | INTRAVENOUS | Status: AC
  Filled 2019-04-27: qty 250

## 2019-04-27 NOTE — Progress Notes (Signed)
Hematology/Oncology Consult note [facility] Telephone:([phone] [phone]   Patient Care Team: Einar Pheasant, MD as PCP - General (Internal Medicine)  REFERRING PROVIDER: Einar Pheasant, MD  CHIEF COMPLAINTS/REASON FOR VISIT:  Follow-up for thymoma  HISTORY OF PRESENTING ILLNESS:   Madison Burns is a  69 y.o.  female with PMH listed below was seen in consultation at the request of  Einar Pheasant, MD  for evaluation of thymoma.  Patient was accompanied by her sister today.  History was obtained from her sister.  Patient has intellectual disability Patient initially underwent a CT abdomen pelvis with contrast on 02/07/2019 for worsening umbilical pain.  It reviewed incidental 3 cm renal mass on the left.  Subsequent MRCP 02/21/2019 performed for the purpose of right upper quadrant pain as well as renal mass in question.  MRI 02/20/2018 showed 3.2 x 2.6 left lower pole endophytic renal mass, consistent with possible renal cell carcinoma.  No signs of metastatic disease in abdomen.  Adrenal lesion more suggestive adenomas.  There was an incidental finding of lobular area along the anterior mediastinum, questionably e mediastinal mass.  CT scan 02/07/2019 showed lobulated internally calcified mass of the anterior mediastinum which effaces the central portion of the left brachiocephalic vein and a very closely abuts the tubular ascending aorta, 5.8 x 4.9 x 4.1 cm.  Adjacent pleural nodularity about the right upper lobe. 3 mm pulmonary nodule of the right upper lobe, hepatic steatosis.  #Patient has a chronic history of myasthenia gravis, initially diagnosed in 2011.  Patient had been treated with prednisone for prolonged period of time.  Recently she has been off prednisone and is currently taking methotrexate and pyridostigmine for symptoms.  Per sister, patient does not complain much of weakness.  At her baseline.  She is able to feed and dress herself.  She  needs some assistance from her sister for bathing.  She likes to watch TV.  Patient has been seen by Dr. Genevive Bi and Dr. Erlene Quan.  Patient's sister would like to entertain the diagnosis and potential therapy. Patient underwent CT-guided needle biopsy of the anterior mediastinal mass, as well as biopsy of the kidney mass Anterior mediastinal mass biopsy pathology showed thymoma, predominantly B2 patent with subsets of a B3 pattern. Left kidney mass showed renal cell carcinoma,tumor cannot be reliably subtyped based on this limited sample and the IHC profile  She has a history of melanoma on her neck in 2017.    Sister is POA. Patient lives with sister  INTERVAL HISTORY Madison Burns is a 69 y.o. female who has above history reviewed by me today presents for follow up visit for management of malignant thymoma Problems and complaints are listed below: Patient presented for evaluation prior to starting chemotherapy. No new complaints.  Patient was accompanied by her sister.  Patient has baseline mental disability. History was obtained from her sister.   Review of Systems  Unable to perform ROS: Other (intellectual disability)    MEDICAL HISTORY:  Past Medical History:  Diagnosis Date  . Cancer of kidney ([place]) 2021  . GERD (gastroesophageal reflux disease)   . History of seizure disorder   . Hypercholesterolemia   . Hypertension   . Irritable bowel syndrome   . Myasthenia gravis ([place]) 2011  . OSA on CPAP   . Skin cancer 2017   Melanoma on neck    SURGICAL HISTORY: Past Surgical History:  Procedure Laterality Date  . ABDOMINAL HYSTERECTOMY  1990  . APPENDECTOMY  [place]  2013  . COLONOSCOPY WITH PROPOFOL N/A 06/30/2017   Procedure: COLONOSCOPY WITH PROPOFOL;  Surgeon: Toledo, Benay Pike, MD;  Location: [facility] ENDOSCOPY;  Service: Gastroenterology;  Laterality: N/A;  . ESOPHAGOGASTRODUODENOSCOPY (EGD) WITH PROPOFOL N/A 06/30/2017   Procedure:  ESOPHAGOGASTRODUODENOSCOPY (EGD) WITH PROPOFOL;  Surgeon: Toledo, Benay Pike, MD;  Location: [facility] ENDOSCOPY;  Service: Gastroenterology;  Laterality: N/A;  . TONSILLECTOMY  1959    SOCIAL HISTORY: Social History   Socioeconomic History  . Marital status: Single    Spouse name: Not on file  . Number of children: 0  . Years of education: Special Ed  . Highest education level: Not on file  Occupational History  . Occupation: Unemployed  Tobacco Use  . Smoking status: Never Smoker  . Smokeless tobacco: Never Used  Substance and Sexual Activity  . Alcohol use: No    Alcohol/week: 0.0 standard drinks  . Drug use: No  . Sexual activity: Not Currently  Other Topics Concern  . Not on file  Social History Narrative   06/23/17 lives with sister, [name]   Regular exercise-no   Caffeine Use-yes   Social Determinants of Health   Financial Resource Strain:   . Difficulty of Paying Living Expenses: Not on file  Food Insecurity:   . Worried About Charity fundraiser in the Last Year: Not on file  . Ran Out of Food in the Last Year: Not on file  Transportation Needs:   . Lack of Transportation (Medical): Not on file  . Lack of Transportation (Non-Medical): Not on file  Physical Activity:   . Days of Exercise per Week: Not on file  . Minutes of Exercise per Session: Not on file  Stress:   . Feeling of Stress : Not on file  Social Connections:   . Frequency of Communication with Friends and Family: Not on file  . Frequency of Social Gatherings with Friends and Family: Not on file  . Attends Religious Services: Not on file  . Active Member of Clubs or Organizations: Not on file  . Attends Archivist Meetings: Not on file  . Marital Status: Not on file  Intimate Partner Violence:   . Fear of Current or Ex-Partner: Not on file  . Emotionally Abused: Not on file  . Physically Abused: Not on file  . Sexually Abused: Not on file    FAMILY HISTORY: Family History  Problem  Relation Age of Onset  . Colon cancer Maternal Grandfather        stomach/colon  . Heart disease Father        myocardial infarction  . Hyperlipidemia Father   . Hyperlipidemia Sister   . Diabetes Sister   . Bone cancer Other        cousin  . Alzheimer's disease Mother        maternal aunts  . Skin cancer Mother   . Myasthenia gravis Brother        ocular  . Skin cancer Brother   . Stroke Paternal Grandfather   . Breast cancer Neg Hx     ALLERGIES:  is allergic to ciprofloxacin and levofloxacin.  MEDICATIONS:  Current Outpatient Medications  Medication Sig Dispense Refill  . benzonatate (TESSALON) 100 MG capsule Take 1 capsule (100 mg total) by mouth 2 (two) times daily as needed for cough. 21 capsule 0  . dexamethasone (DECADRON) 4 MG tablet Take 2 tablets (8 mg total) by mouth daily. Start the day after chemotherapy for  1 day. 30 tablet 1  . folic acid (FOLVITE) 1 MG tablet Take 2 tablets (2 mg total) by mouth daily. 180 tablet 3  . levothyroxine (SYNTHROID) 75 MCG tablet TAKE 1 TABLET BY MOUTH ONCE DAILY ON AN EMPTY STOMACH. WAIT 30 MINUTES BEFORE TAKING OTHER MEDS. 90 tablet 1  . methotrexate (RHEUMATREX) 2.5 MG tablet Take 4 tablets (10 mg total) by mouth once a week. Caution: Protect from light. 16 tablet 3  . ondansetron (ZOFRAN) 8 MG tablet Take 1 tablet (8 mg total) by mouth 2 (two) times daily as needed for refractory nausea / vomiting. Start on day 3 after carboplatin chemo. 30 tablet 1  . pantoprazole (PROTONIX) 40 MG tablet 40 mg daily.    . prochlorperazine (COMPAZINE) 10 MG tablet Take 1 tablet (10 mg total) by mouth every 6 (six) hours as needed (Nausea or vomiting). 30 tablet 1  . pyridostigmine (MESTINON) 60 MG tablet TAKE 1 TABLET BY MOUTH AT  8 AM, ONE AT LUNCH AND ONE AT 8PM 90 tablet 6  . risperiDONE (RISPERDAL) 2 MG tablet     . sucralfate (CARAFATE) 1 g tablet Take 1 g by mouth 2 (two) times daily.    [name] tolterodine (DETROL LA) 4 MG 24 hr capsule TAKE 1  CAPSULE BY MOUTH ONCE EVERY MORNING 90 capsule 1   No current facility-administered medications for this visit.     PHYSICAL EXAMINATION: ECOG PERFORMANCE STATUS: 1 - Symptomatic but completely ambulatory Vitals:   04/27/19 0831  BP: 120/70  Pulse: 79  Resp: 18  Temp: (!) 96 F (35.6 C)  SpO2: 98%   Filed Weights   04/27/19 0831  Weight: 215 lb (97.5 kg)    Physical Exam Constitutional:      General: She is not in acute distress.    Appearance: She is obese.  HENT:     Head: Normocephalic and atraumatic.  Eyes:     General: No scleral icterus. Cardiovascular:     Rate and Rhythm: Normal rate and regular rhythm.     Heart sounds: Normal heart sounds.  Pulmonary:     Effort: Pulmonary effort is normal. No respiratory distress.     Breath sounds: No wheezing.  Abdominal:     General: Bowel sounds are normal. There is no distension.     Palpations: Abdomen is soft.  Musculoskeletal:        General: No deformity. Normal range of motion.     Cervical back: Normal range of motion and neck supple.  Skin:    General: Skin is warm and dry.     Findings: No erythema or rash.  Neurological:     Mental Status: She is alert and oriented to person, place, and time. Mental status is at baseline.     Cranial Nerves: No cranial nerve deficit.     Coordination: Coordination normal.     Comments: Oriented in person and time.  Muscle strength, 4/5 all extremities.   Psychiatric:        Mood and Affect: Mood normal.     LABORATORY DATA:  I have reviewed the data as listed Lab Results  Component Value Date   WBC 10.1 04/15/2019   HGB 14.6 04/15/2019   HCT 44.9 04/15/2019   MCV 96.1 04/15/2019   PLT 178 04/15/2019   Recent Labs    08/06/18 1007 08/06/18 1007 12/14/18 0857 12/14/18 0857 01/01/19 1728 03/07/19 0757 04/15/19 1134  NA 141   < > 141  --  141  --  140  K 3.8   < > 3.6  --  4.0  --  3.8  CL 107   < > 105  --  106  --  105  CO2 27   < > 30  --  25  --   24  GLUCOSE 88   < > 120*  --  115*  --  103*  BUN 15   < > 14  --  13  --  24*  CREATININE 0.94   < > 0.78   < > 0.94 0.90 0.79  CALCIUM 8.7   < > 9.0  --  8.9  --  9.3  GFRNONAA  --   --   --   --  >60  --  >60  GFRAA  --   --   --   --  >60  --  >60  PROT 5.8*  --  6.1  --   --   --  7.2  ALBUMIN 3.8  --  4.0  --   --   --  4.1  AST 19  --  20  --   --   --  27  ALT 19  --  16  --   --   --  21  ALKPHOS 62  --  59  --   --   --  52  BILITOT 0.4  --  0.6  --   --   --  0.6  BILIDIR 0.1  --  0.1  --   --   --   --    < > = values in this interval not displayed.   Iron/TIBC/Ferritin/ %Sat No results found for: IRON, TIBC, FERRITIN, IRONPCTSAT    RADIOGRAPHIC STUDIES: I have personally reviewed the radiological images as listed and agreed with the findings in the report.  CT BIOPSY  Result Date: 03/28/2019 CLINICAL DATA:  Monoclonal gammopathy of unknown significance. Left lower pole renal mass. EXAM: CT GUIDED NEEDLE ASPIRATE AND CORE BIOPSY OF RIGHT ILIAC BONE MARROW CT-GUIDED CORE BIOPSY, LEFT RENAL MASS ANESTHESIA/SEDATION: Intravenous Fentanyl 16mcg and Versed 1mg  were administered as conscious sedation during continuous monitoring of the patient's level of consciousness and physiological / cardiorespiratory status by the radiology RN, with a total moderate sedation time of 50 minutes. PROCEDURE: The procedure risks, benefits, and alternatives were explained to the patient. Questions regarding the procedure were encouraged and answered. The patient understands and consents to the procedure. Select axial scans through the upper thorax were obtained. The anterior mediastinal mass was localized and an appropriate skin entry site was determined and marked. The operative field was prepped with chlorhexidinein a sterile fashion, and a sterile drape was applied covering the operative field. A sterile gown and sterile gloves were used for the procedure. Local anesthesia was provided with 1%  Lidocaine. Under CT fluoroscopic guidance, a 17 gauge trocar needle was advanced to the margin of the lesion. Once needle tip position was confirmed, coaxial 18-gauge core biopsy samples were obtained, submitted in formalin to surgical pathology. The guide needle was removed. Postprocedure scans show no significant hemorrhage or other apparent complication. No pneumothorax. The patient was then reposition prone. Select axial scans through the retroperitoneum obtained in the left renal mass was localized. An appropriate skin entry site was determined. The operative field was prepped with chlorhexidinein a sterile fashion, and a sterile drape was applied covering the operative field. A sterile gown and sterile gloves were used for  the procedure. Local anesthesia was provided with 1% Lidocaine. Under CT fluoroscopic guidance, a 17 gauge trocar needle was advanced to the margin of the lesion. Once needle tip position was confirmed, coaxial 18-gauge core biopsy samples were obtained, submitted in formalin to surgical pathology. The guide needle was removed. Postprocedure scans show minimal regional retroperitoneal hemorrhage. Patient remained hemodynamically stable. COMPLICATIONS: None immediate FINDINGS: Lobular partially calcified anterior mediastinal mass was localized. Representative core biopsy samples obtained. Slightly hyperdense lower pole left renal mass was localized. Representative core biopsy samples were obtained. IMPRESSION: 1. Technically successful CT-guided core biopsy, anterior mediastinal mass. 2. Technically successful CT-guided core biopsy, left renal mass. Electronically Signed   By: Lucrezia Europe M.D.   On: 03/28/2019 14:26   CT BIOPSY  Result Date: 03/28/2019 CLINICAL DATA:  Monoclonal gammopathy of unknown significance. Left lower pole renal mass. EXAM: CT GUIDED NEEDLE ASPIRATE AND CORE BIOPSY OF RIGHT ILIAC BONE MARROW CT-GUIDED CORE BIOPSY, LEFT RENAL MASS ANESTHESIA/SEDATION: Intravenous  Fentanyl 51mcg and Versed 1mg  were administered as conscious sedation during continuous monitoring of the patient's level of consciousness and physiological / cardiorespiratory status by the radiology RN, with a total moderate sedation time of 50 minutes. PROCEDURE: The procedure risks, benefits, and alternatives were explained to the patient. Questions regarding the procedure were encouraged and answered. The patient understands and consents to the procedure. Select axial scans through the upper thorax were obtained. The anterior mediastinal mass was localized and an appropriate skin entry site was determined and marked. The operative field was prepped with chlorhexidinein a sterile fashion, and a sterile drape was applied covering the operative field. A sterile gown and sterile gloves were used for the procedure. Local anesthesia was provided with 1% Lidocaine. Under CT fluoroscopic guidance, a 17 gauge trocar needle was advanced to the margin of the lesion. Once needle tip position was confirmed, coaxial 18-gauge core biopsy samples were obtained, submitted in formalin to surgical pathology. The guide needle was removed. Postprocedure scans show no significant hemorrhage or other apparent complication. No pneumothorax. The patient was then reposition prone. Select axial scans through the retroperitoneum obtained in the left renal mass was localized. An appropriate skin entry site was determined. The operative field was prepped with chlorhexidinein a sterile fashion, and a sterile drape was applied covering the operative field. A sterile gown and sterile gloves were used for the procedure. Local anesthesia was provided with 1% Lidocaine. Under CT fluoroscopic guidance, a 17 gauge trocar needle was advanced to the margin of the lesion. Once needle tip position was confirmed, coaxial 18-gauge core biopsy samples were obtained, submitted in formalin to surgical pathology. The guide needle was removed. Postprocedure  scans show minimal regional retroperitoneal hemorrhage. Patient remained hemodynamically stable. COMPLICATIONS: None immediate FINDINGS: Lobular partially calcified anterior mediastinal mass was localized. Representative core biopsy samples obtained. Slightly hyperdense lower pole left renal mass was localized. Representative core biopsy samples were obtained. IMPRESSION: 1. Technically successful CT-guided core biopsy, anterior mediastinal mass. 2. Technically successful CT-guided core biopsy, left renal mass. Electronically Signed   By: Lucrezia Europe M.D.   On: 03/28/2019 14:26      ASSESSMENT & PLAN:  1. Thymoma   2. MG (myasthenia gravis) ([place])   3. Renal cell cancer, left ([place])   4. Encounter for antineoplastic chemotherapy    #Invasive thymoma, Labs reviewed and discussed with patient. She will have radiation simulation today and will start new treatments tomorrow. Patient's sister wants to discuss further on the chemotherapy carboplatin and  etoposide. I discussed with her about the risk/benefit of the chemotherapy using carboplatin and etoposide, including but not limited to hair loss, mouth sores, nausea, vomiting, diarrhea, low blood counts, bleeding, life-threatening infection and even death.  Secondary malignancy. Patient will receive dose reduced carboplatin AUC 4, etoposide day 1 to 3 at 75 mg/m, every 3 weeks. No growth factor support due to being on radiation. Labs reviewed and discussed with patient.  Proceed with cycle 1 treatment. Patient will be followed closely with blood work and assessment. I discussed antiemetic instructions of Zofran, Compazine and dexamethasone.  # Left renal cell carcinoma Patient was evaluated by urology. Given her multiple cormobidities, most likely observation vs ablation, etc. Per urology, will continue surveillance at this point.  Patient will have additional images for reevaluation after she finishes chemo and radiation therapy for thymoma  treatments.  # Myasthenia Gravis. Recommend patient to hold methotrexate Continue pyridostigmine follow-up with neurology.  #Follow-up appointment in 1 week for assessment of tolerability All questions were answered. The patient knows to call the clinic with any problems questions or concerns.  cc Einar Pheasant, MD    Return of visit: 1 week We spent sufficient time to discuss many aspect of care, questions were answered to patient and her sister's satisfaction. A total of 40 minutes was spent on this visit.  With 5 minutes spent obtaining history from sister, 30 minutes counseling the patient and sister about the on the chemotherapy treatments, side effects of the treatment, instructions of the antiemetics.  Additional 5 minutes was spent on answering sister's  questions.       Earlie Server, MD, PhD Hematology Oncology [facility] at [facility] Pager- [phone]

## 2019-04-27 NOTE — Progress Notes (Signed)
Patient here for follow up and to start new tx carbo etop. No new concerns voiced.

## 2019-04-28 ENCOUNTER — Ambulatory Visit
Admission: RE | Admit: 2019-04-28 | Discharge: 2019-04-28 | Disposition: A | Discharge status: Home / Self Care | Payer: PPO | Source: Ambulatory Visit | Attending: Radiation Oncology | Admitting: Radiation Oncology

## 2019-04-28 ENCOUNTER — Inpatient Hospital Stay: Payer: PPO

## 2019-04-28 ENCOUNTER — Telehealth: Payer: Self-pay

## 2019-04-28 VITALS — BP 124/65 | HR 77 | Temp 99.2°F | Resp 18

## 2019-04-28 DIAGNOSIS — Z510 Encounter for antineoplastic radiation therapy: Secondary | ICD-10-CM | POA: Diagnosis not present

## 2019-04-28 DIAGNOSIS — D4989 Neoplasm of unspecified behavior of other specified sites: Secondary | ICD-10-CM

## 2019-04-28 MED ORDER — SODIUM CHLORIDE 0.9 % IV SOLN
10.0000 mg | Freq: Once | INTRAVENOUS | Status: AC
  Administered 2019-04-28: 10 mg via INTRAVENOUS
  Filled 2019-04-28: qty 10

## 2019-04-28 MED ORDER — SODIUM CHLORIDE 0.9 % IV SOLN
75.0000 mg/m2 | Freq: Once | INTRAVENOUS | Status: AC
  Administered 2019-04-28: 150 mg via INTRAVENOUS
  Filled 2019-04-28: qty 7.5

## 2019-04-28 MED ORDER — SODIUM CHLORIDE 0.9 % IV SOLN
Freq: Once | INTRAVENOUS | Status: AC
  Filled 2019-04-28: qty 250

## 2019-04-28 NOTE — Telephone Encounter (Signed)
Telephone call to pt for follow up after receiving first chemo yesterday.   No answer but left message to let her know we are calling to check on her.   Message left on voice mail.   Encourage pt to call for any questions or concerns.

## 2019-04-29 ENCOUNTER — Other Ambulatory Visit: Payer: Self-pay

## 2019-04-29 ENCOUNTER — Inpatient Hospital Stay: Payer: PPO

## 2019-04-29 ENCOUNTER — Ambulatory Visit
Admission: RE | Admit: 2019-04-29 | Discharge: 2019-04-29 | Disposition: A | Discharge status: Home / Self Care | Payer: PPO | Source: Ambulatory Visit | Attending: Radiation Oncology | Admitting: Radiation Oncology

## 2019-04-29 VITALS — BP 124/74 | HR 65 | Temp 99.0°F | Resp 20

## 2019-04-29 DIAGNOSIS — D4989 Neoplasm of unspecified behavior of other specified sites: Secondary | ICD-10-CM

## 2019-04-29 DIAGNOSIS — Z510 Encounter for antineoplastic radiation therapy: Secondary | ICD-10-CM | POA: Diagnosis not present

## 2019-04-29 MED ORDER — SODIUM CHLORIDE 0.9 % IV SOLN
10.0000 mg | Freq: Once | INTRAVENOUS | Status: AC
  Administered 2019-04-29: 10 mg via INTRAVENOUS
  Filled 2019-04-29: qty 10

## 2019-04-29 MED ORDER — SODIUM CHLORIDE 0.9 % IV SOLN
Freq: Once | INTRAVENOUS | Status: AC
  Filled 2019-04-29: qty 250

## 2019-04-29 MED ORDER — SODIUM CHLORIDE 0.9 % IV SOLN
75.0000 mg/m2 | Freq: Once | INTRAVENOUS | Status: AC
  Administered 2019-04-29: 150 mg via INTRAVENOUS
  Filled 2019-04-29: qty 7.5

## 2019-05-02 ENCOUNTER — Ambulatory Visit
Admission: RE | Admit: 2019-05-02 | Discharge: 2019-05-02 | Disposition: A | Discharge status: Home / Self Care | Payer: PPO | Source: Ambulatory Visit | Attending: Radiation Oncology | Admitting: Radiation Oncology

## 2019-05-02 ENCOUNTER — Encounter: Payer: Self-pay | Admitting: *Deleted

## 2019-05-02 DIAGNOSIS — Z510 Encounter for antineoplastic radiation therapy: Secondary | ICD-10-CM | POA: Diagnosis not present

## 2019-05-02 NOTE — Progress Notes (Signed)
  Oncology Nurse Navigator Documentation  Navigator Location: [facility] (05/02/19 1300) Referral Date to RadOnc/MedOnc: 04/11/19 (05/02/19 1300) )Navigator Encounter Type: Lobby (05/02/19 1300)   Abnormal Finding Date: 03/07/19 (05/02/19 1300) Confirmed Diagnosis Date: 03/30/19 (05/02/19 1300)             Treatment Initiated Date: 04/27/19 (05/02/19 1300) Patient Visit Type: AEWYBR (05/02/19 1300) Treatment Phase: Active Tx (05/02/19 1300) Barriers/Navigation Needs: Health Literacy (05/02/19 1300)   Interventions: None Required (05/02/19 1300)            Acuity: Level 2-Minimal Needs (1-2 Barriers Identified) (05/02/19 1300)    met patient and her sister after radiation treatment to introduce to navigator services. All questions answered during visit. Contact info given to sister and instructed to call with any further questions or needs. Pt's sister verbalized understanding.      Time Spent with Patient: 30 (05/02/19 1300)

## 2019-05-03 ENCOUNTER — Ambulatory Visit
Admission: RE | Admit: 2019-05-03 | Discharge: 2019-05-03 | Disposition: A | Discharge status: Home / Self Care | Payer: PPO | Source: Ambulatory Visit | Attending: Radiation Oncology | Admitting: Radiation Oncology

## 2019-05-03 DIAGNOSIS — Z510 Encounter for antineoplastic radiation therapy: Secondary | ICD-10-CM | POA: Diagnosis not present

## 2019-05-04 ENCOUNTER — Ambulatory Visit
Admission: RE | Admit: 2019-05-04 | Discharge: 2019-05-04 | Disposition: A | Discharge status: Home / Self Care | Payer: PPO | Source: Ambulatory Visit | Attending: Radiation Oncology | Admitting: Radiation Oncology

## 2019-05-04 DIAGNOSIS — C37 Malignant neoplasm of thymus: Secondary | ICD-10-CM | POA: Diagnosis not present

## 2019-05-04 DIAGNOSIS — Z510 Encounter for antineoplastic radiation therapy: Secondary | ICD-10-CM | POA: Diagnosis not present

## 2019-05-05 ENCOUNTER — Ambulatory Visit
Admission: RE | Admit: 2019-05-05 | Discharge: 2019-05-05 | Disposition: A | Discharge status: Home / Self Care | Payer: PPO | Source: Ambulatory Visit | Attending: Radiation Oncology | Admitting: Radiation Oncology

## 2019-05-05 DIAGNOSIS — Z510 Encounter for antineoplastic radiation therapy: Secondary | ICD-10-CM | POA: Diagnosis not present

## 2019-05-06 ENCOUNTER — Inpatient Hospital Stay: Payer: PPO

## 2019-05-06 ENCOUNTER — Ambulatory Visit
Admission: RE | Admit: 2019-05-06 | Discharge: 2019-05-06 | Disposition: A | Discharge status: Home / Self Care | Payer: PPO | Source: Ambulatory Visit | Attending: Radiation Oncology | Admitting: Radiation Oncology

## 2019-05-06 ENCOUNTER — Inpatient Hospital Stay: Payer: PPO | Admitting: Oncology

## 2019-05-06 ENCOUNTER — Encounter: Payer: Self-pay | Admitting: Oncology

## 2019-05-06 ENCOUNTER — Other Ambulatory Visit: Payer: Self-pay

## 2019-05-06 VITALS — BP 128/85 | HR 87 | Temp 98.7°F | Resp 18 | Wt 212.9 lb

## 2019-05-06 DIAGNOSIS — D4989 Neoplasm of unspecified behavior of other specified sites: Secondary | ICD-10-CM | POA: Diagnosis not present

## 2019-05-06 DIAGNOSIS — Z510 Encounter for antineoplastic radiation therapy: Secondary | ICD-10-CM | POA: Diagnosis not present

## 2019-05-06 DIAGNOSIS — C642 Malignant neoplasm of left kidney, except renal pelvis: Secondary | ICD-10-CM

## 2019-05-06 DIAGNOSIS — G7000 Myasthenia gravis without (acute) exacerbation: Secondary | ICD-10-CM

## 2019-05-06 LAB — CBC WITH DIFFERENTIAL/PLATELET
Abs Immature Granulocytes: 0.05 10*3/uL (ref 0.00–0.07)
Basophils Absolute: 0 10*3/uL (ref 0.0–0.1)
Basophils Relative: 0 %
Eosinophils Absolute: 0 10*3/uL (ref 0.0–0.5)
Eosinophils Relative: 1 %
HCT: 39.5 % (ref 36.0–46.0)
Hemoglobin: 12.9 g/dL (ref 12.0–15.0)
Immature Granulocytes: 1 %
Lymphocytes Relative: 25 %
Lymphs Abs: 1.2 10*3/uL (ref 0.7–4.0)
MCH: 31.5 pg (ref 26.0–34.0)
MCHC: 32.7 g/dL (ref 30.0–36.0)
MCV: 96.3 fL (ref 80.0–100.0)
Monocytes Absolute: 0.2 10*3/uL (ref 0.1–1.0)
Monocytes Relative: 3 %
Neutro Abs: 3.3 10*3/uL (ref 1.7–7.7)
Neutrophils Relative %: 70 %
Platelets: 152 10*3/uL (ref 150–400)
RBC: 4.1 MIL/uL (ref 3.87–5.11)
RDW: 14.6 % (ref 11.5–15.5)
WBC: 4.7 10*3/uL (ref 4.0–10.5)
nRBC: 0 % (ref 0.0–0.2)

## 2019-05-06 LAB — COMPREHENSIVE METABOLIC PANEL
ALT: 23 U/L (ref 0–44)
AST: 20 U/L (ref 15–41)
Albumin: 3.7 g/dL (ref 3.5–5.0)
Alkaline Phosphatase: 53 U/L (ref 38–126)
Anion gap: 9 (ref 5–15)
BUN: 15 mg/dL (ref 8–23)
CO2: 26 mmol/L (ref 22–32)
Calcium: 8.5 mg/dL — ABNORMAL LOW (ref 8.9–10.3)
Chloride: 103 mmol/L (ref 98–111)
Creatinine, Ser: 0.88 mg/dL (ref 0.44–1.00)
GFR calc Af Amer: >60 mL/min (ref >60)
GFR calc non Af Amer: >60 mL/min (ref >60)
Glucose, Bld: 101 mg/dL — ABNORMAL HIGH (ref 70–99)
Potassium: 3.9 mmol/L (ref 3.5–5.1)
Sodium: 138 mmol/L (ref 135–145)
Total Bilirubin: 0.7 mg/dL (ref 0.3–1.2)
Total Protein: 6.4 g/dL — ABNORMAL LOW (ref 6.5–8.1)

## 2019-05-06 NOTE — Progress Notes (Signed)
Patient here for follow. Pt's sister reports that patient has had mild nausea, but resolves with antiemetic. No other concerns, patient seems to be doing "very well."  Pt did not take dexamethasone after last chemo. Pt brought vitamins with her and has questions regarding taking them while on chemo.

## 2019-05-07 ENCOUNTER — Other Ambulatory Visit: Payer: Self-pay | Admitting: Internal Medicine

## 2019-05-07 NOTE — Progress Notes (Signed)
Hematology/Oncology Consult note [facility] Telephone:([phone] [phone]   Patient Care Team: Einar Pheasant, MD as PCP - General (Internal Medicine) Telford Nab, RN as Oncology Nurse Navigator  REFERRING PROVIDER: Einar Pheasant, MD  CHIEF COMPLAINTS/REASON FOR VISIT:  Follow-up for thymoma  HISTORY OF PRESENTING ILLNESS:   Madison Burns is a  69 y.o.  female with PMH listed below was seen in consultation at the request of  Einar Pheasant, MD  for evaluation of thymoma.  Patient was accompanied by her sister today.  History was obtained from her sister.  Patient has intellectual disability Patient initially underwent a CT abdomen pelvis with contrast on 02/07/2019 for worsening umbilical pain.  It reviewed incidental 3 cm renal mass on the left.  Subsequent MRCP 02/21/2019 performed for the purpose of right upper quadrant pain as well as renal mass in question.  MRI 02/20/2018 showed 3.2 x 2.6 left lower pole endophytic renal mass, consistent with possible renal cell carcinoma.  No signs of metastatic disease in abdomen.  Adrenal lesion more suggestive adenomas.  There was an incidental finding of lobular area along the anterior mediastinum, questionably e mediastinal mass.  CT scan 02/07/2019 showed lobulated internally calcified mass of the anterior mediastinum which effaces the central portion of the left brachiocephalic vein and a very closely abuts the tubular ascending aorta, 5.8 x 4.9 x 4.1 cm.  Adjacent pleural nodularity about the right upper lobe. 3 mm pulmonary nodule of the right upper lobe, hepatic steatosis.  #Patient has a chronic history of myasthenia gravis, initially diagnosed in 2011.  Patient had been treated with prednisone for prolonged period of time.  Recently she has been off prednisone and is currently taking methotrexate and pyridostigmine for symptoms.  Per sister, patient does not complain much of weakness.  At her  baseline.  She is able to feed and dress herself.  She needs some assistance from her sister for bathing.  She likes to watch TV.  Patient has been seen by Dr. Genevive Bi and Dr. Erlene Quan.  Patient's sister would like to entertain the diagnosis and potential therapy. Patient underwent CT-guided needle biopsy of the anterior mediastinal mass, as well as biopsy of the kidney mass Anterior mediastinal mass biopsy pathology showed thymoma, predominantly B2 patent with subsets of a B3 pattern. Left kidney mass showed renal cell carcinoma,tumor cannot be reliably subtyped based on this limited sample and the IHC profile  She has a history of melanoma on her neck in 2017.    Sister is POA. Patient lives with sister  INTERVAL HISTORY Madison Burns is a 69 y.o. female who has above history reviewed by me today presents for follow up visit for management of malignant thymoma Problems and complaints are listed below: Patient received one cycle of Carboplatin and Etoposide.  She is also on radiation treatments.  She is accompanied by her sister.  History was obtained from sister.  Overall she tolerates well. No vomiting, diarrhea, mouth sore. Mild nasuea and antiemetics help her symptoms   Review of Systems  Unable to perform ROS: Other (Intellectual Disability)    MEDICAL HISTORY:  Past Medical History:  Diagnosis Date  . Cancer of kidney ([place]) 2021  . GERD (gastroesophageal reflux disease)   . History of seizure disorder   . Hypercholesterolemia   . Hypertension   . Irritable bowel syndrome   . Myasthenia gravis ([place]) 2011  . OSA on CPAP   . Skin cancer 2017   Melanoma on  neck    SURGICAL HISTORY: Past Surgical History:  Procedure Laterality Date  . ABDOMINAL HYSTERECTOMY  1990  . APPENDECTOMY  1999  . CHOLECYSTECTOMY  2013  . COLONOSCOPY WITH PROPOFOL N/A 06/30/2017   Procedure: COLONOSCOPY WITH PROPOFOL;  Surgeon: Toledo, Benay Pike, MD;  Location: [facility] ENDOSCOPY;  Service:  Gastroenterology;  Laterality: N/A;  . ESOPHAGOGASTRODUODENOSCOPY (EGD) WITH PROPOFOL N/A 06/30/2017   Procedure: ESOPHAGOGASTRODUODENOSCOPY (EGD) WITH PROPOFOL;  Surgeon: Toledo, Benay Pike, MD;  Location: [facility] ENDOSCOPY;  Service: Gastroenterology;  Laterality: N/A;  . TONSILLECTOMY  1959    SOCIAL HISTORY: Social History   Socioeconomic History  . Marital status: Single    Spouse name: Not on file  . Number of children: 0  . Years of education: Special Ed  . Highest education level: Not on file  Occupational History  . Occupation: Unemployed  Tobacco Use  . Smoking status: Never Smoker  . Smokeless tobacco: Never Used  Substance and Sexual Activity  . Alcohol use: No    Alcohol/week: 0.0 standard drinks  . Drug use: No  . Sexual activity: Not Currently  Other Topics Concern  . Not on file  Social History Narrative   06/23/17 lives with sister, [name]   Regular exercise-no   Caffeine Use-yes   Social Determinants of Health   Financial Resource Strain:   . Difficulty of Paying Living Expenses:   Food Insecurity:   . Worried About Charity fundraiser in the Last Year:   . Arboriculturist in the Last Year:   Transportation Needs:   . Film/video editor (Medical):   [name] Lack of Transportation (Non-Medical):   Physical Activity:   . Days of Exercise per Week:   . Minutes of Exercise per Session:   Stress:   . Feeling of Stress :   Social Connections:   . Frequency of Communication with Friends and Family:   . Frequency of Social Gatherings with Friends and Family:   . Attends Religious Services:   . Active Member of Clubs or Organizations:   . Attends Archivist Meetings:   [name] Marital Status:   Intimate Partner Violence:   . Fear of Current or Ex-Partner:   . Emotionally Abused:   [name] Physically Abused:   . Sexually Abused:     FAMILY HISTORY: Family History  Problem Relation Age of Onset  . Colon cancer Maternal Grandfather        stomach/colon  .  Heart disease Father        myocardial infarction  . Hyperlipidemia Father   . Hyperlipidemia Sister   . Diabetes Sister   . Bone cancer Other        cousin  . Alzheimer's disease Mother        maternal aunts  . Skin cancer Mother   . Myasthenia gravis Brother        ocular  . Skin cancer Brother   . Stroke Paternal Grandfather   . Breast cancer Neg Hx     ALLERGIES:  is allergic to ciprofloxacin and levofloxacin.  MEDICATIONS:  Current Outpatient Medications  Medication Sig Dispense Refill  . Ascorbic Acid (VITAMIN C) 1000 MG tablet Take 1,000 mg by mouth daily.    [name] b complex vitamins tablet Take 1 tablet by mouth daily.    . benzonatate (TESSALON) 100 MG capsule TAKE 1 CAPSULE BY MOUTH TWICE DAILY AS NEEDED COUGH 21 capsule 0  . Biotin 10000 MCG TABS Take 1 capsule by  mouth daily.    . Calcium-Phosphorus-Vitamin D (CITRACAL +D3 PO) Take 25-1,000 mcg by mouth daily.    . folic acid (FOLVITE) 1 MG tablet Take 2 tablets (2 mg total) by mouth daily. 180 tablet 3  . levothyroxine (SYNTHROID) 75 MCG tablet TAKE 1 TABLET BY MOUTH ONCE DAILY ON AN EMPTY STOMACH. WAIT 30 MINUTES BEFORE TAKING OTHER MEDS. 90 tablet 1  . ondansetron (ZOFRAN) 8 MG tablet Take 1 tablet (8 mg total) by mouth 2 (two) times daily as needed for refractory nausea / vomiting. Start on day 3 after carboplatin chemo. 30 tablet 1  . OVER THE COUNTER MEDICATION Take 1 tablet by mouth daily. BRAIN ENERGIZER    . pantoprazole (PROTONIX) 40 MG tablet 40 mg daily.    . Probiotic Product (ALIGN PO) Take 1 capsule by mouth daily.    . prochlorperazine (COMPAZINE) 10 MG tablet Take 1 tablet (10 mg total) by mouth every 6 (six) hours as needed (Nausea or vomiting). 30 tablet 1  . pyridostigmine (MESTINON) 60 MG tablet TAKE 1 TABLET BY MOUTH AT  8 AM, ONE AT LUNCH AND ONE AT 8PM 90 tablet 6  . risperiDONE (RISPERDAL) 2 MG tablet     . sucralfate (CARAFATE) 1 g tablet Take 1 g by mouth 2 (two) times daily.    [name] tolterodine  (DETROL LA) 4 MG 24 hr capsule TAKE 1 CAPSULE BY MOUTH ONCE EVERY MORNING 90 capsule 1  . dexamethasone (DECADRON) 4 MG tablet Take 2 tablets (8 mg total) by mouth daily. Start the day after chemotherapy for 1 day. (Patient not taking: Reported on 04/27/2019) 30 tablet 1  . methotrexate (RHEUMATREX) 2.5 MG tablet Take 4 tablets (10 mg total) by mouth once a week. Caution: Protect from light. (Patient not taking: Reported on 04/27/2019) 16 tablet 3   No current facility-administered medications for this visit.     PHYSICAL EXAMINATION: ECOG PERFORMANCE STATUS: 1 - Symptomatic but completely ambulatory Vitals:   05/06/19 1111  BP: 128/85  Pulse: 87  Resp: 18  Temp: 98.7 F (37.1 C)  SpO2: 97%   Filed Weights   05/06/19 1111  Weight: 212 lb 14.4 oz (96.6 kg)    Physical Exam Constitutional:      General: She is not in acute distress.    Appearance: She is obese.  HENT:     Head: Normocephalic and atraumatic.  Eyes:     General: No scleral icterus. Cardiovascular:     Rate and Rhythm: Normal rate and regular rhythm.     Heart sounds: Normal heart sounds.  Pulmonary:     Effort: Pulmonary effort is normal. No respiratory distress.     Breath sounds: No wheezing.  Abdominal:     General: Bowel sounds are normal. There is no distension.     Palpations: Abdomen is soft.  Musculoskeletal:        General: No deformity. Normal range of motion.     Cervical back: Normal range of motion and neck supple.  Skin:    General: Skin is warm and dry.     Findings: No erythema or rash.  Neurological:     General: No focal deficit present.     Mental Status: She is alert. Mental status is at baseline.     Cranial Nerves: No cranial nerve deficit.     Coordination: Coordination normal.     Comments: Oriented in person Muscle strength, 4/5 all extremities.   Psychiatric:  Mood and Affect: Mood normal.     LABORATORY DATA:  I have reviewed the data as listed Lab Results    Component Value Date   WBC 4.7 05/06/2019   HGB 12.9 05/06/2019   HCT 39.5 05/06/2019   MCV 96.3 05/06/2019   PLT 152 05/06/2019   Recent Labs    08/06/18 1007 08/06/18 1007 12/14/18 0857 01/01/19 1728 04/15/19 1134 04/27/19 0809 05/06/19 1020  NA 141   < > 141   < > 140 140 138  K 3.8   < > 3.6   < > 3.8 3.7 3.9  CL 107   < > 105   < > 105 105 103  CO2 27   < > 30   < > 24 25 26   GLUCOSE 88   < > 120*   < > 103* 103* 101*  BUN 15   < > 14   < > 24* 17 15  CREATININE 0.94   < > 0.78   < > 0.79 0.86 0.88  CALCIUM 8.7   < > 9.0   < > 9.3 8.5* 8.5*  GFRNONAA  --   --   --    < > >60 >60 >60  GFRAA  --   --   --    < > >60 >60 >60  PROT 5.8*   < > 6.1  --  7.2 6.4* 6.4*  ALBUMIN 3.8   < > 4.0  --  4.1 3.5 3.7  AST 19   < > 20  --  27 24 20   ALT 19   < > 16  --  21 22 23   ALKPHOS 62   < > 59  --  52 80 53  BILITOT 0.4   < > 0.6  --  0.6 0.4 0.7  BILIDIR 0.1  --  0.1  --   --   --   --    < > = values in this interval not displayed.   Iron/TIBC/Ferritin/ %Sat No results found for: IRON, TIBC, FERRITIN, IRONPCTSAT    RADIOGRAPHIC STUDIES: I have personally reviewed the radiological images as listed and agreed with the findings in the report.. CT CHEST W CONTRAST  Result Date: 03/07/2019 CLINICAL DATA:  Staging, suspected renal cell carcinoma EXAM: CT CHEST WITH CONTRAST TECHNIQUE: Multidetector CT imaging of the chest was performed during intravenous contrast administration. CONTRAST:  53mL OMNIPAQUE IOHEXOL 300 MG/ML  SOLN COMPARISON:  None. FINDINGS: Cardiovascular: No significant vascular findings. Normal heart size. No pericardial effusion. Mediastinum/Nodes: No enlarged mediastinal, hilar, or axillary lymph nodes. There is a lobulated, internally calcified mass of the anterior mediastinum which effaces the central portion of the left brachiocephalic vein and very closely abuts the tubular ascending aorta and measures approximately 5.8 x 4.9 x 4.1 cm (series 2, image 58,  series 6, image 80). There appears to be adjacent pleural nodularity about the right upper lobe (series 2, image 39, series 5, image 55). Thyroid gland, trachea, and esophagus demonstrate no significant findings. Lungs/Pleura: There is a 3 mm pulmonary nodule of the right upper lobe (series 3, image 68). 3 mm pulmonary nodule of the anterior right middle lobe (series 3, image 91). No pleural effusion or pneumothorax. Upper Abdomen: No acute abnormality. Hepatic steatosis. Probable bilateral adrenal adenomas, better characterized by prior MRI. Musculoskeletal: No chest wall mass or suspicious bone lesions identified. IMPRESSION: 1. There is a lobulated, internally calcified mass of the anterior mediastinum which effaces the central portion  of the left brachiocephalic vein and very closely abuts the tubular ascending aorta, measuring approximately 5.8 x 4.9 x 4.1 cm (series 2, image 58, series 6, image 80). 2. There appears to be adjacent pleural nodularity about the right upper lobe (series 2, image 39, series 5, image 55). 3. Findings are characteristic in location and appearance for thymoma, and apparent involvement of the left brachiocephalic vein and pleura are concerning for invasive thymic carcinoma. Although patient has a presumed renal cell carcinoma better evaluated by prior MRI, metastatic disease is much less favored given this appearance. 4. There is a 3 mm pulmonary nodule of the right upper lobe (series 3, image 68). 3 mm pulmonary nodule of the anterior right middle lobe (series 3, image 91). These nodules are nonspecific and metastatic disease is not excluded. Attention on follow-up. 5. Hepatic steatosis. 6. Probable bilateral adrenal adenomas, better characterized by prior MRI. Electronically Signed   By: Eddie Candle M.D.   On: 03/07/2019 08:34   CT ABDOMEN PELVIS W CONTRAST  Result Date: 02/07/2019 CLINICAL DATA:  Periumbilical pain.  Worsening lately. EXAM: CT ABDOMEN AND PELVIS WITH CONTRAST  TECHNIQUE: Multidetector CT imaging of the abdomen and pelvis was performed using the standard protocol following bolus administration of intravenous contrast. CONTRAST:  163mL OMNIPAQUE IOHEXOL 300 MG/ML  SOLN COMPARISON:  No comparison studies available. FINDINGS: Lower chest: Unremarkable. Hepatobiliary: No suspicious focal abnormality within the liver parenchyma. Gallbladder is surgically absent. No intrahepatic or extrahepatic biliary dilation. Pancreas: No focal mass lesion. No dilatation of the main duct. No intraparenchymal cyst. No peripancreatic edema. Spleen: No splenomegaly. No focal mass lesion. Adrenals/Urinary Tract: 10 mm right adrenal nodule cannot be definitively characterized. Left adrenal thickening noted with 1.6 cm nodular component. This cannot be characterized based on washout characteristics. Right kidney unremarkable. Although somewhat obscured by motion on portal venous phase imaging, renal delay series suggests the presence of a subtle 3.2 cm homogeneously hypoenhancing lesion in the posterior interpolar left kidney (image 27/series 7). Central sinus cysts noted left kidney. No evidence for hydroureter. Tiny gas bubble noted in the bladder lumen, presumably from recent instrumentation. Stomach/Bowel: Stomach is unremarkable. No gastric wall thickening. No evidence of outlet obstruction. Duodenum is normally positioned as is the ligament of Treitz. No small bowel wall thickening. No small bowel dilatation. The terminal ileum is normal. Nonvisualization of the appendix is consistent with the reported history of appendectomy. Diverticular changes are noted in the left colon without evidence of diverticulitis. Vascular/Lymphatic: There is abdominal aortic atherosclerosis without aneurysm. There is no gastrohepatic or hepatoduodenal ligament lymphadenopathy. No retroperitoneal or mesenteric lymphadenopathy. No pelvic sidewall lymphadenopathy. Reproductive: Unremarkable. Other: No  intraperitoneal free fluid. Musculoskeletal: No worrisome lytic or sclerotic osseous abnormality. IMPRESSION: 1. Apparent subtle 3.2 cm homogeneously hypoenhancing lesion in the posterior interpolar left kidney. Imaging features raise concern for papillary type renal cell carcinoma. Abdominal MRI with and without contrast recommended to further evaluate. 2. Bilateral adrenal nodules. These could also be further assessed at the time of follow-up MRI. 3. No specific findings to explain the patient's history of periumbilical pain. 4. Tiny gas bubble identified in the urinary bladder, presumably from recent instrumentation. In the absence of instrumentation, infection would be a consideration. 5. Insert [name] 6. Left colonic diverticulosis without diverticulitis. These results will be called to the ordering clinician or representative by the Radiologist Assistant, and communication documented in the PACS or zVision Dashboard. Electronically Signed   By: Misty Stanley M.D.   On: 02/07/2019  12:04   MR 3D Recon At Scanner  Addendum Date: 02/21/2019   ADDENDUM REPORT: 02/21/2019 21:37 ADDENDUM: Note that in the impression of the report a statement reads "highly suspicious for renal cell carcinoma including papillary renal cell carcinoma" this should state "highly suspicious for renal cell carcinoma including clear cell renal cell carcinoma" the imaging features are not typical on MRI for papillary renal cell carcinoma. These results will be called to the ordering clinician or representative by the Radiologist Assistant, and communication documented in the PACS or zVision Dashboard. Electronically Signed   By: Zetta Bills M.D.   On: 02/21/2019 21:37   Result Date: 02/21/2019 CLINICAL DATA:  Right upper quadrant pain. Post cholecystectomy with renal lesion discovered on previous exam. EXAM: MRI ABDOMEN WITHOUT AND WITH CONTRAST (INCLUDING MRCP) TECHNIQUE: Multiplanar multisequence MR imaging of the abdomen was  performed both before and after the administration of intravenous contrast. Heavily T2-weighted images of the biliary and pancreatic ducts were obtained, and three-dimensional MRCP images were rendered by post processing. CONTRAST:  35mL GADAVIST GADOBUTROL 1 MMOL/ML IV SOLN COMPARISON:  CT study of 02/07/2019 FINDINGS: Lower chest: No signs of effusion or consolidation at the lung bases. Lobular area of diffusion related signal abnormality corresponding to lobular T2 signal abnormality on coronal images best seen on image 13 of series 13 and image 12 of series 13 along the superior aspect of the heart, also seen on post-contrast imaging, not clearly a vascular structure though certainly in the area of the brachia cephalic vein. Perhaps slightly lower than the expected location of the brachiocephalic vein potentially representing a mediastinal lesion, as large is 3 by 2.4 cm. Hepatobiliary: No signs of focal hepatic lesion. Mild intra and extrahepatic biliary ductal distension following cholecystectomy is not changed. No signs of filling defect in the biliary tree. Pancreas: No mass, inflammatory changes, or other parenchymal abnormality identified. Spleen:  Normal spleen. Adrenals/Urinary Tract: Limited assessment of nodular adrenal glands that were seen on the previous study due to respiratory motion. Suggestion of signal loss on out of phase image that would be indicative of small adrenal adenomas. The more pronounced thickening of the left adrenal is not as well appreciated on thicker cut MR images that are not protocol for adrenal evaluation. Smooth renal contours bilaterally. Endophytic lesion in the left kidney measuring 3.2 x 2.6 cm. This shows hyperintense features on T2 arising from the lower margin of the posterior hilar lip. Some mild heterogeneity with subtle T1 hypointensity. Note that assessment on today's study is limited due to respiratory motion. There is enhancement postcontrast but without the  typical enhancement pattern for renal cell carcinoma including papillary renal cell carcinoma. Though the lesion does appear to be hypervascular. No signs of hydronephrosis. Symmetric renal enhancement. Stomach/Bowel: Bowel is normal to the extent visualized. Pelvic bowel loops are not well assessed. Study not protocol for bowel evaluation. Vascular/Lymphatic: No sign of acute vascular process. Patent abdominal vasculature. No signs of adenopathy in the abdomen. Other:  None. Musculoskeletal: No suspicious bone lesions identified. IMPRESSION: 1. Endophytic lesion in the left kidney measures 3.2 x 2.6 cm. Imaging features are highly suspicious for renal cell carcinoma including papillary renal cell carcinoma, imaging features not typical for papillary renal cell. 2. No signs of metastatic disease in the abdomen. 3. Adrenal lesions that are more suggestive of adenomas, consider attention on follow-up at 6 months with adrenal protocol CT given the respiratory motion on today's study, depending on results of biopsy if performed of the  left kidney. 4. Lobular area along the anterior mediastinum could represent a vascular structure or prominent right atrial appendage, appearance raises the question of mediastinal mass. CT of the chest may be helpful for further assessment. 5. Post cholecystectomy with stable appearance of mild biliary ductal distension likely post cholecystectomy baseline. Electronically Signed: By: Zetta Bills M.D. On: 02/21/2019 21:30   CT BIOPSY  Result Date: 03/28/2019 CLINICAL DATA:  Monoclonal gammopathy of unknown significance. Left lower pole renal mass. EXAM: CT GUIDED NEEDLE ASPIRATE AND CORE BIOPSY OF RIGHT ILIAC BONE MARROW CT-GUIDED CORE BIOPSY, LEFT RENAL MASS ANESTHESIA/SEDATION: Intravenous Fentanyl 79mcg and Versed 1mg  were administered as conscious sedation during continuous monitoring of the patient's level of consciousness and physiological / cardiorespiratory status by the  radiology RN, with a total moderate sedation time of 50 minutes. PROCEDURE: The procedure risks, benefits, and alternatives were explained to the patient. Questions regarding the procedure were encouraged and answered. The patient understands and consents to the procedure. Select axial scans through the upper thorax were obtained. The anterior mediastinal mass was localized and an appropriate skin entry site was determined and marked. The operative field was prepped with chlorhexidinein a sterile fashion, and a sterile drape was applied covering the operative field. A sterile gown and sterile gloves were used for the procedure. Local anesthesia was provided with 1% Lidocaine. Under CT fluoroscopic guidance, a 17 gauge trocar needle was advanced to the margin of the lesion. Once needle tip position was confirmed, coaxial 18-gauge core biopsy samples were obtained, submitted in formalin to surgical pathology. The guide needle was removed. Postprocedure scans show no significant hemorrhage or other apparent complication. No pneumothorax. The patient was then reposition prone. Select axial scans through the retroperitoneum obtained in the left renal mass was localized. An appropriate skin entry site was determined. The operative field was prepped with chlorhexidinein a sterile fashion, and a sterile drape was applied covering the operative field. A sterile gown and sterile gloves were used for the procedure. Local anesthesia was provided with 1% Lidocaine. Under CT fluoroscopic guidance, a 17 gauge trocar needle was advanced to the margin of the lesion. Once needle tip position was confirmed, coaxial 18-gauge core biopsy samples were obtained, submitted in formalin to surgical pathology. The guide needle was removed. Postprocedure scans show minimal regional retroperitoneal hemorrhage. Patient remained hemodynamically stable. COMPLICATIONS: None immediate FINDINGS: Lobular partially calcified anterior mediastinal mass  was localized. Representative core biopsy samples obtained. Slightly hyperdense lower pole left renal mass was localized. Representative core biopsy samples were obtained. IMPRESSION: 1. Technically successful CT-guided core biopsy, anterior mediastinal mass. 2. Technically successful CT-guided core biopsy, left renal mass. Electronically Signed   By: Lucrezia Europe M.D.   On: 03/28/2019 14:26   CT BIOPSY  Result Date: 03/28/2019 CLINICAL DATA:  Monoclonal gammopathy of unknown significance. Left lower pole renal mass. EXAM: CT GUIDED NEEDLE ASPIRATE AND CORE BIOPSY OF RIGHT ILIAC BONE MARROW CT-GUIDED CORE BIOPSY, LEFT RENAL MASS ANESTHESIA/SEDATION: Intravenous Fentanyl 39mcg and Versed 1mg  were administered as conscious sedation during continuous monitoring of the patient's level of consciousness and physiological / cardiorespiratory status by the radiology RN, with a total moderate sedation time of 50 minutes. PROCEDURE: The procedure risks, benefits, and alternatives were explained to the patient. Questions regarding the procedure were encouraged and answered. The patient understands and consents to the procedure. Select axial scans through the upper thorax were obtained. The anterior mediastinal mass was localized and an appropriate skin entry site was determined and marked.  The operative field was prepped with chlorhexidinein a sterile fashion, and a sterile drape was applied covering the operative field. A sterile gown and sterile gloves were used for the procedure. Local anesthesia was provided with 1% Lidocaine. Under CT fluoroscopic guidance, a 17 gauge trocar needle was advanced to the margin of the lesion. Once needle tip position was confirmed, coaxial 18-gauge core biopsy samples were obtained, submitted in formalin to surgical pathology. The guide needle was removed. Postprocedure scans show no significant hemorrhage or other apparent complication. No pneumothorax. The patient was then reposition  prone. Select axial scans through the retroperitoneum obtained in the left renal mass was localized. An appropriate skin entry site was determined. The operative field was prepped with chlorhexidinein a sterile fashion, and a sterile drape was applied covering the operative field. A sterile gown and sterile gloves were used for the procedure. Local anesthesia was provided with 1% Lidocaine. Under CT fluoroscopic guidance, a 17 gauge trocar needle was advanced to the margin of the lesion. Once needle tip position was confirmed, coaxial 18-gauge core biopsy samples were obtained, submitted in formalin to surgical pathology. The guide needle was removed. Postprocedure scans show minimal regional retroperitoneal hemorrhage. Patient remained hemodynamically stable. COMPLICATIONS: None immediate FINDINGS: Lobular partially calcified anterior mediastinal mass was localized. Representative core biopsy samples obtained. Slightly hyperdense lower pole left renal mass was localized. Representative core biopsy samples were obtained. IMPRESSION: 1. Technically successful CT-guided core biopsy, anterior mediastinal mass. 2. Technically successful CT-guided core biopsy, left renal mass. Electronically Signed   By: Lucrezia Europe M.D.   On: 03/28/2019 14:26   MR ABDOMEN MRCP W WO CONTAST  Addendum Date: 02/21/2019   ADDENDUM REPORT: 02/21/2019 21:37 ADDENDUM: Note that in the impression of the report a statement reads "highly suspicious for renal cell carcinoma including papillary renal cell carcinoma" this should state "highly suspicious for renal cell carcinoma including clear cell renal cell carcinoma" the imaging features are not typical on MRI for papillary renal cell carcinoma. These results will be called to the ordering clinician or representative by the Radiologist Assistant, and communication documented in the PACS or zVision Dashboard. Electronically Signed   By: Zetta Bills M.D.   On: 02/21/2019 21:37   Result Date:  02/21/2019 CLINICAL DATA:  Right upper quadrant pain. Post cholecystectomy with renal lesion discovered on previous exam. EXAM: MRI ABDOMEN WITHOUT AND WITH CONTRAST (INCLUDING MRCP) TECHNIQUE: Multiplanar multisequence MR imaging of the abdomen was performed both before and after the administration of intravenous contrast. Heavily T2-weighted images of the biliary and pancreatic ducts were obtained, and three-dimensional MRCP images were rendered by post processing. CONTRAST:  28mL GADAVIST GADOBUTROL 1 MMOL/ML IV SOLN COMPARISON:  CT study of 02/07/2019 FINDINGS: Lower chest: No signs of effusion or consolidation at the lung bases. Lobular area of diffusion related signal abnormality corresponding to lobular T2 signal abnormality on coronal images best seen on image 13 of series 13 and image 12 of series 13 along the superior aspect of the heart, also seen on post-contrast imaging, not clearly a vascular structure though certainly in the area of the brachia cephalic vein. Perhaps slightly lower than the expected location of the brachiocephalic vein potentially representing a mediastinal lesion, as large is 3 by 2.4 cm. Hepatobiliary: No signs of focal hepatic lesion. Mild intra and extrahepatic biliary ductal distension following cholecystectomy is not changed. No signs of filling defect in the biliary tree. Pancreas: No mass, inflammatory changes, or other parenchymal abnormality identified. Spleen:  Normal  spleen. Adrenals/Urinary Tract: Limited assessment of nodular adrenal glands that were seen on the previous study due to respiratory motion. Suggestion of signal loss on out of phase image that would be indicative of small adrenal adenomas. The more pronounced thickening of the left adrenal is not as well appreciated on thicker cut MR images that are not protocol for adrenal evaluation. Smooth renal contours bilaterally. Endophytic lesion in the left kidney measuring 3.2 x 2.6 cm. This shows hyperintense  features on T2 arising from the lower margin of the posterior hilar lip. Some mild heterogeneity with subtle T1 hypointensity. Note that assessment on today's study is limited due to respiratory motion. There is enhancement postcontrast but without the typical enhancement pattern for renal cell carcinoma including papillary renal cell carcinoma. Though the lesion does appear to be hypervascular. No signs of hydronephrosis. Symmetric renal enhancement. Stomach/Bowel: Bowel is normal to the extent visualized. Pelvic bowel loops are not well assessed. Study not protocol for bowel evaluation. Vascular/Lymphatic: No sign of acute vascular process. Patent abdominal vasculature. No signs of adenopathy in the abdomen. Other:  None. Musculoskeletal: No suspicious bone lesions identified. IMPRESSION: 1. Endophytic lesion in the left kidney measures 3.2 x 2.6 cm. Imaging features are highly suspicious for renal cell carcinoma including papillary renal cell carcinoma, imaging features not typical for papillary renal cell. 2. No signs of metastatic disease in the abdomen. 3. Adrenal lesions that are more suggestive of adenomas, consider attention on follow-up at 6 months with adrenal protocol CT given the respiratory motion on today's study, depending on results of biopsy if performed of the left kidney. 4. Lobular area along the anterior mediastinum could represent a vascular structure or prominent right atrial appendage, appearance raises the question of mediastinal mass. CT of the chest may be helpful for further assessment. 5. Post cholecystectomy with stable appearance of mild biliary ductal distension likely post cholecystectomy baseline. Electronically Signed: By: Zetta Bills M.D. On: 02/21/2019 21:30       ASSESSMENT & PLAN:  1. Thymoma   2. Renal cell cancer, left ([place])   3. MG (myasthenia gravis) ([facility])    #Invasive thymoma, S/p 1 cycle of  reduced carboplatin AUC 4, etoposide day 1 to 3 at 75 mg/m,  every 3 weeks. No growth factor support due to being on radiation. Overall she tolerates well.  Labs are reviewed and discussed with patient.  # left renal cell carcinoma.  observation for now. She has follow up appointment with Dr.Brandon in April.   # Myasthenia Gravis. Hold methotrexate continue pyridostigmine follow-up with neurology.  #Follow-up appointment in 2 weeks for assessment of tolerability All questions were answered. The patient knows to call the clinic with any problems questions or concerns.   Earlie Server, MD, PhD Hematology Oncology [facility] Pager- [phone]

## 2019-05-09 ENCOUNTER — Ambulatory Visit
Admission: RE | Admit: 2019-05-09 | Discharge: 2019-05-09 | Disposition: A | Discharge status: Home / Self Care | Payer: PPO | Source: Ambulatory Visit | Attending: Radiation Oncology | Admitting: Radiation Oncology

## 2019-05-09 DIAGNOSIS — Z510 Encounter for antineoplastic radiation therapy: Secondary | ICD-10-CM | POA: Diagnosis not present

## 2019-05-10 ENCOUNTER — Ambulatory Visit
Admission: RE | Admit: 2019-05-10 | Discharge: 2019-05-10 | Disposition: A | Discharge status: Home / Self Care | Payer: PPO | Source: Ambulatory Visit | Attending: Radiation Oncology | Admitting: Radiation Oncology

## 2019-05-10 DIAGNOSIS — Z510 Encounter for antineoplastic radiation therapy: Secondary | ICD-10-CM | POA: Diagnosis not present

## 2019-05-11 ENCOUNTER — Ambulatory Visit
Admission: RE | Admit: 2019-05-11 | Discharge: 2019-05-11 | Disposition: A | Discharge status: Home / Self Care | Payer: PPO | Source: Ambulatory Visit | Attending: Radiation Oncology | Admitting: Radiation Oncology

## 2019-05-11 DIAGNOSIS — C37 Malignant neoplasm of thymus: Secondary | ICD-10-CM | POA: Diagnosis not present

## 2019-05-11 DIAGNOSIS — Z510 Encounter for antineoplastic radiation therapy: Secondary | ICD-10-CM | POA: Diagnosis not present

## 2019-05-11 NOTE — Progress Notes (Signed)

## 2019-05-12 ENCOUNTER — Ambulatory Visit
Admission: RE | Admit: 2019-05-12 | Discharge: 2019-05-12 | Disposition: A | Discharge status: Home / Self Care | Payer: PPO | Source: Ambulatory Visit | Attending: Radiation Oncology | Admitting: Radiation Oncology

## 2019-05-12 DIAGNOSIS — Z510 Encounter for antineoplastic radiation therapy: Secondary | ICD-10-CM | POA: Diagnosis not present

## 2019-05-12 NOTE — Telephone Encounter (Signed)
F/u scheduled on 07/05/19, last filled on 04/27/19 #21 caps with 0 refills  [facility] (pharmacy)

## 2019-05-12 NOTE — Progress Notes (Signed)

## 2019-05-12 NOTE — Telephone Encounter (Signed)
Please call and confirm if needs the tessalon perles.  This was written as a one time rx.

## 2019-05-13 ENCOUNTER — Ambulatory Visit
Admission: RE | Admit: 2019-05-13 | Discharge: 2019-05-13 | Disposition: A | Discharge status: Home / Self Care | Payer: PPO | Source: Ambulatory Visit | Attending: Radiation Oncology | Admitting: Radiation Oncology

## 2019-05-13 DIAGNOSIS — Z510 Encounter for antineoplastic radiation therapy: Secondary | ICD-10-CM | POA: Diagnosis not present

## 2019-05-16 ENCOUNTER — Ambulatory Visit
Admission: RE | Admit: 2019-05-16 | Discharge: 2019-05-16 | Disposition: A | Discharge status: Home / Self Care | Payer: PPO | Source: Ambulatory Visit | Attending: Radiation Oncology | Admitting: Radiation Oncology

## 2019-05-16 DIAGNOSIS — Z510 Encounter for antineoplastic radiation therapy: Secondary | ICD-10-CM | POA: Diagnosis not present

## 2019-05-17 ENCOUNTER — Ambulatory Visit
Admission: RE | Admit: 2019-05-17 | Discharge: 2019-05-17 | Disposition: A | Discharge status: Home / Self Care | Payer: PPO | Source: Ambulatory Visit | Attending: Radiation Oncology | Admitting: Radiation Oncology

## 2019-05-17 DIAGNOSIS — Z510 Encounter for antineoplastic radiation therapy: Secondary | ICD-10-CM | POA: Diagnosis not present

## 2019-05-18 ENCOUNTER — Inpatient Hospital Stay: Payer: PPO

## 2019-05-18 ENCOUNTER — Inpatient Hospital Stay: Payer: PPO | Admitting: Oncology

## 2019-05-18 ENCOUNTER — Other Ambulatory Visit: Payer: Self-pay

## 2019-05-18 ENCOUNTER — Encounter: Payer: Self-pay | Admitting: Oncology

## 2019-05-18 ENCOUNTER — Ambulatory Visit
Admission: RE | Admit: 2019-05-18 | Discharge: 2019-05-18 | Disposition: A | Discharge status: Home / Self Care | Payer: PPO | Source: Ambulatory Visit | Attending: Radiation Oncology | Admitting: Radiation Oncology

## 2019-05-18 VITALS — BP 124/83 | HR 85 | Temp 97.6°F | Resp 16 | Wt 206.5 lb

## 2019-05-18 DIAGNOSIS — Z5111 Encounter for antineoplastic chemotherapy: Secondary | ICD-10-CM | POA: Diagnosis not present

## 2019-05-18 DIAGNOSIS — D4989 Neoplasm of unspecified behavior of other specified sites: Secondary | ICD-10-CM

## 2019-05-18 DIAGNOSIS — Z510 Encounter for antineoplastic radiation therapy: Secondary | ICD-10-CM | POA: Diagnosis not present

## 2019-05-18 DIAGNOSIS — G7000 Myasthenia gravis without (acute) exacerbation: Secondary | ICD-10-CM | POA: Diagnosis not present

## 2019-05-18 DIAGNOSIS — C37 Malignant neoplasm of thymus: Secondary | ICD-10-CM | POA: Diagnosis not present

## 2019-05-18 DIAGNOSIS — C642 Malignant neoplasm of left kidney, except renal pelvis: Secondary | ICD-10-CM | POA: Diagnosis not present

## 2019-05-18 LAB — CBC WITH DIFFERENTIAL/PLATELET
Abs Immature Granulocytes: 0.1 10*3/uL — ABNORMAL HIGH (ref 0.00–0.07)
Basophils Absolute: 0 10*3/uL (ref 0.0–0.1)
Basophils Relative: 1 %
Eosinophils Absolute: 0.1 10*3/uL (ref 0.0–0.5)
Eosinophils Relative: 2 %
HCT: 40.4 % (ref 36.0–46.0)
Hemoglobin: 13.6 g/dL (ref 12.0–15.0)
Immature Granulocytes: 3 %
Lymphocytes Relative: 29 %
Lymphs Abs: 0.9 10*3/uL (ref 0.7–4.0)
MCH: 31.6 pg (ref 26.0–34.0)
MCHC: 33.7 g/dL (ref 30.0–36.0)
MCV: 93.7 fL (ref 80.0–100.0)
Monocytes Absolute: 0.5 10*3/uL (ref 0.1–1.0)
Monocytes Relative: 16 %
Neutro Abs: 1.6 10*3/uL — ABNORMAL LOW (ref 1.7–7.7)
Neutrophils Relative %: 49 %
Platelets: 144 10*3/uL — ABNORMAL LOW (ref 150–400)
RBC: 4.31 MIL/uL (ref 3.87–5.11)
RDW: 15 % (ref 11.5–15.5)
WBC: 3.2 10*3/uL — ABNORMAL LOW (ref 4.0–10.5)
nRBC: 0 % (ref 0.0–0.2)

## 2019-05-18 LAB — COMPREHENSIVE METABOLIC PANEL
ALT: 23 U/L (ref 0–44)
AST: 27 U/L (ref 15–41)
Albumin: 3.6 g/dL (ref 3.5–5.0)
Alkaline Phosphatase: 62 U/L (ref 38–126)
Anion gap: 7 (ref 5–15)
BUN: 14 mg/dL (ref 8–23)
CO2: 24 mmol/L (ref 22–32)
Calcium: 8.6 mg/dL — ABNORMAL LOW (ref 8.9–10.3)
Chloride: 107 mmol/L (ref 98–111)
Creatinine, Ser: 0.98 mg/dL (ref 0.44–1.00)
GFR calc Af Amer: >60 mL/min (ref >60)
GFR calc non Af Amer: 60 mL/min — ABNORMAL LOW (ref >60)
Glucose, Bld: 116 mg/dL — ABNORMAL HIGH (ref 70–99)
Potassium: 3.6 mmol/L (ref 3.5–5.1)
Sodium: 138 mmol/L (ref 135–145)
Total Bilirubin: 0.4 mg/dL (ref 0.3–1.2)
Total Protein: 6.4 g/dL — ABNORMAL LOW (ref 6.5–8.1)

## 2019-05-18 MED ORDER — SODIUM CHLORIDE 0.9 % IV SOLN
Freq: Once | INTRAVENOUS | Status: AC
  Filled 2019-05-18: qty 250

## 2019-05-18 MED ORDER — SODIUM CHLORIDE 0.9 % IV SOLN
10.0000 mg | Freq: Once | INTRAVENOUS | Status: AC
  Administered 2019-05-18: 10 mg via INTRAVENOUS
  Filled 2019-05-18: qty 10

## 2019-05-18 MED ORDER — PALONOSETRON HCL INJECTION 0.25 MG/5ML
0.2500 mg | Freq: Once | INTRAVENOUS | Status: AC
  Administered 2019-05-18: 0.25 mg via INTRAVENOUS
  Filled 2019-05-18: qty 5

## 2019-05-18 MED ORDER — SODIUM CHLORIDE 0.9 % IV SOLN
75.0000 mg/m2 | Freq: Once | INTRAVENOUS | Status: AC
  Administered 2019-05-18: 150 mg via INTRAVENOUS
  Filled 2019-05-18: qty 7.5

## 2019-05-18 MED ORDER — SODIUM CHLORIDE 0.9 % IV SOLN
422.8000 mg | Freq: Once | INTRAVENOUS | Status: AC
  Administered 2019-05-18: 420 mg via INTRAVENOUS
  Filled 2019-05-18: qty 42

## 2019-05-18 MED ORDER — SODIUM CHLORIDE 0.9 % IV SOLN
150.0000 mg | Freq: Once | INTRAVENOUS | Status: AC
  Administered 2019-05-18: 150 mg via INTRAVENOUS
  Filled 2019-05-18: qty 150

## 2019-05-18 NOTE — Progress Notes (Signed)
Hematology/Oncology Consult note [facility] [phone] [phone]   Patient Care Team: Einar Pheasant, MD as PCP - General (Internal Medicine) Telford Nab, RN as Oncology Nurse Navigator  REFERRING PROVIDER: Einar Pheasant, MD  CHIEF COMPLAINTS/REASON FOR VISIT:  Follow-up for thymoma  HISTORY OF PRESENTING ILLNESS:   Madison Burns is a  69 y.o.  female with PMH listed below was seen in consultation at the request of  Einar Pheasant, MD  for evaluation of thymoma.  Patient was accompanied by her sister today.  History was obtained from her sister.  Patient has intellectual disability Patient initially underwent a CT abdomen pelvis with contrast on 02/07/2019 for worsening umbilical pain.  It reviewed incidental 3 cm renal mass on the left.  Subsequent MRCP 02/21/2019 performed for the purpose of right upper quadrant pain as well as renal mass in question.  MRI 02/20/2018 showed 3.2 x 2.6 left lower pole endophytic renal mass, consistent with possible renal cell carcinoma.  No signs of metastatic disease in abdomen.  Adrenal lesion more suggestive adenomas.  There was an incidental finding of lobular area along the anterior mediastinum, questionably e mediastinal mass.  CT scan 02/07/2019 showed lobulated internally calcified mass of the anterior mediastinum which effaces the central portion of the left brachiocephalic vein and a very closely abuts the tubular ascending aorta, 5.8 x 4.9 x 4.1 cm.  Adjacent pleural nodularity about the right upper lobe. 3 mm pulmonary nodule of the right upper lobe, hepatic steatosis.  #Patient has a chronic history of myasthenia gravis, initially diagnosed in 2011.  Patient had been treated with prednisone for prolonged period of time.  Recently she has been off prednisone and is currently taking methotrexate and pyridostigmine for symptoms.  Per sister, patient does not complain much of weakness.  At her  baseline.  She is able to feed and dress herself.  She needs some assistance from her sister for bathing.  She likes to watch TV.  Patient has been seen by Dr. Genevive Bi and Dr. Erlene Quan.  Patient's sister would like to entertain the diagnosis and potential therapy. Patient underwent CT-guided needle biopsy of the anterior mediastinal mass, as well as biopsy of the kidney mass Anterior mediastinal mass biopsy pathology showed thymoma, predominantly B2 patent with subsets of a B3 pattern. Left kidney mass showed renal cell carcinoma,tumor cannot be reliably subtyped based on this limited sample and the IHC profile  She has a history of melanoma on her neck in 2017.    Sister is POA. Patient lives with sister  INTERVAL HISTORY Madison Burns is a 69 y.o. female who has above history reviewed by me today presents for follow up visit for management of malignant thymoma Problems and complaints are listed below: Patient received one cycle of Carboplatin and Etoposide.  Patient is on radiation treatments.   History was obtained from sister. Some hair loss. Sister noticed that patient had some raspy voice.  Patient denies any sore throat or heartburn. She takes PPI and sucralfate.  No nausea vomiting episodes.  No diarrhea.    Review of Systems  Unable to perform ROS: Other (Intellectual Disability)    MEDICAL HISTORY:  Past Medical History:  Diagnosis Date  . Cancer of kidney ([place]) 2021  . GERD (gastroesophageal reflux disease)   . History of seizure disorder   . Hypercholesterolemia   . Hypertension   . Irritable bowel syndrome   . Myasthenia gravis ([place]) 2011  . OSA on CPAP   .  Skin cancer 2017   Melanoma on neck    SURGICAL HISTORY: Past Surgical History:  Procedure Laterality Date  . ABDOMINAL HYSTERECTOMY  1990  . APPENDECTOMY  1999  . CHOLECYSTECTOMY  2013  . COLONOSCOPY WITH PROPOFOL N/A 06/30/2017   Procedure: COLONOSCOPY WITH PROPOFOL;  Surgeon: Toledo, Benay Pike, MD;   Location: [facility] ENDOSCOPY;  Service: Gastroenterology;  Laterality: N/A;  . ESOPHAGOGASTRODUODENOSCOPY (EGD) WITH PROPOFOL N/A 06/30/2017   Procedure: ESOPHAGOGASTRODUODENOSCOPY (EGD) WITH PROPOFOL;  Surgeon: Toledo, Benay Pike, MD;  Location: [facility] ENDOSCOPY;  Service: Gastroenterology;  Laterality: N/A;  . TONSILLECTOMY  1959    SOCIAL HISTORY: Social History   Socioeconomic History  . Marital status: Single    Spouse name: Not on file  . Number of children: 0  . Years of education: Special Ed  . Highest education level: Not on file  Occupational History  . Occupation: Unemployed  Tobacco Use  . Smoking status: Never Smoker  . Smokeless tobacco: Never Used  Substance and Sexual Activity  . Alcohol use: No    Alcohol/week: 0.0 standard drinks  . Drug use: No  . Sexual activity: Not Currently  Other Topics Concern  . Not on file  Social History Narrative   06/23/17 lives with sister, Madison Burns   Regular exercise-no   Caffeine Use-yes   Social Determinants of Health   Financial Resource Strain:   . Difficulty of Paying Living Expenses:   Food Insecurity:   . Worried About Charity fundraiser in the Last Year:   . Arboriculturist in the Last Year:   Transportation Needs:   . Film/video editor (Medical):   [name] Lack of Transportation (Non-Medical):   Physical Activity:   . Days of Exercise per Week:   . Minutes of Exercise per Session:   Stress:   . Feeling of Stress :   Social Connections:   . Frequency of Communication with Friends and Family:   . Frequency of Social Gatherings with Friends and Family:   . Attends Religious Services:   . Active Member of Clubs or Organizations:   . Attends Archivist Meetings:   [name] Marital Status:   Intimate Partner Violence:   . Fear of Current or Ex-Partner:   . Emotionally Abused:   [name] Physically Abused:   . Sexually Abused:     FAMILY HISTORY: Family History  Problem Relation Age of Onset  . Colon cancer Maternal  Grandfather        stomach/colon  . Heart disease Father        myocardial infarction  . Hyperlipidemia Father   . Hyperlipidemia Sister   . Diabetes Sister   . Bone cancer Other        cousin  . Alzheimer's disease Mother        maternal aunts  . Skin cancer Mother   . Myasthenia gravis Brother        ocular  . Skin cancer Brother   . Stroke Paternal Grandfather   . Breast cancer Neg Hx     ALLERGIES:  is allergic to ciprofloxacin and levofloxacin.  MEDICATIONS:  Current Outpatient Medications  Medication Sig Dispense Refill  . Ascorbic Acid (VITAMIN C) 1000 MG tablet Take 1,000 mg by mouth daily.    [name] b complex vitamins tablet Take 1 tablet by mouth daily.    . benzonatate (TESSALON) 100 MG capsule TAKE 1 CAPSULE BY MOUTH TWICE DAILY AS NEEDED COUGH 21 capsule 0  . Biotin  10000 MCG TABS Take 1 capsule by mouth daily.    . Calcium-Phosphorus-Vitamin D (CITRACAL +D3 PO) Take 25-1,000 mcg by mouth daily.    . folic acid (FOLVITE) 1 MG tablet Take 2 tablets (2 mg total) by mouth daily. 180 tablet 3  . levothyroxine (SYNTHROID) 75 MCG tablet TAKE 1 TABLET BY MOUTH ONCE DAILY ON AN EMPTY STOMACH. WAIT 30 MINUTES BEFORE TAKING OTHER MEDS. 90 tablet 1  . ondansetron (ZOFRAN) 8 MG tablet Take 1 tablet (8 mg total) by mouth 2 (two) times daily as needed for refractory nausea / vomiting. Start on day 3 after carboplatin chemo. 30 tablet 1  . OVER THE COUNTER MEDICATION Take 1 tablet by mouth daily. BRAIN ENERGIZER    . pantoprazole (PROTONIX) 40 MG tablet 40 mg daily.    . Probiotic Product (ALIGN PO) Take 1 capsule by mouth daily.    . prochlorperazine (COMPAZINE) 10 MG tablet Take 1 tablet (10 mg total) by mouth every 6 (six) hours as needed (Nausea or vomiting). 30 tablet 1  . pyridostigmine (MESTINON) 60 MG tablet TAKE 1 TABLET BY MOUTH AT  8 AM, ONE AT LUNCH AND ONE AT 8PM 90 tablet 6  . risperiDONE (RISPERDAL) 2 MG tablet     . sucralfate (CARAFATE) 1 g tablet Take 1 g by mouth 2  (two) times daily.    [name] tolterodine (DETROL LA) 4 MG 24 hr capsule TAKE 1 CAPSULE BY MOUTH ONCE EVERY MORNING 90 capsule 1  . dexamethasone (DECADRON) 4 MG tablet Take 2 tablets (8 mg total) by mouth daily. Start the day after chemotherapy for 1 day. (Patient not taking: Reported on 04/27/2019) 30 tablet 1  . methotrexate (RHEUMATREX) 2.5 MG tablet Take 4 tablets (10 mg total) by mouth once a week. Caution: Protect from light. (Patient not taking: Reported on 04/27/2019) 16 tablet 3   No current facility-administered medications for this visit.   Facility-Administered Medications Ordered in Other Visits  Medication Dose Route Frequency Provider Last Rate Last Admin  . CARBOplatin (PARAPLATIN) 420 mg in sodium chloride 0.9 % 250 mL chemo infusion  420 mg Intravenous Once Earlie Server, MD 584 mL/hr at 05/18/19 1038 420 mg at 05/18/19 1038  . etoposide (VEPESID) 150 mg in sodium chloride 0.9 % 500 mL chemo infusion  75 mg/m2 (Treatment Plan Recorded) Intravenous Once Earlie Server, MD         PHYSICAL EXAMINATION: ECOG PERFORMANCE STATUS: 1 - Symptomatic but completely ambulatory Vitals:   05/18/19 0834  BP: 124/83  Pulse: 85  Resp: 16  Temp: 97.6 F (36.4 C)   Filed Weights   05/18/19 0834  Weight: 206 lb 8 oz (93.7 kg)    Physical Exam Constitutional:      General: She is not in acute distress.    Appearance: She is obese.  HENT:     Head: Normocephalic and atraumatic.  Eyes:     General: No scleral icterus. Cardiovascular:     Rate and Rhythm: Normal rate and regular rhythm.     Heart sounds: Normal heart sounds.  Pulmonary:     Effort: Pulmonary effort is normal. No respiratory distress.     Breath sounds: No wheezing.  Abdominal:     General: Bowel sounds are normal. There is no distension.     Palpations: Abdomen is soft.  Musculoskeletal:        General: No deformity. Normal range of motion.     Cervical back: Normal range of motion and neck  supple.  Skin:    General: Skin  is warm and dry.     Findings: No erythema or rash.  Neurological:     General: No focal deficit present.     Mental Status: She is alert and oriented to person, place, and time. Mental status is at baseline.     Cranial Nerves: No cranial nerve deficit.     Coordination: Coordination normal.     Comments: Oriented in person Muscle strength, 4/5 all extremities.   Psychiatric:        Mood and Affect: Mood normal.     LABORATORY DATA:  I have reviewed the data as listed Lab Results  Component Value Date   WBC 3.2 (L) 05/18/2019   HGB 13.6 05/18/2019   HCT 40.4 05/18/2019   MCV 93.7 05/18/2019   PLT 144 (L) 05/18/2019   Recent Labs    08/06/18 1007 08/06/18 1007 12/14/18 0857 01/01/19 1728 04/27/19 0809 05/06/19 1020 05/18/19 0811  NA 141   < > 141   < > 140 138 138  K 3.8   < > 3.6   < > 3.7 3.9 3.6  CL 107   < > 105   < > 105 103 107  CO2 27   < > 30   < > 25 26 24   GLUCOSE 88   < > 120*   < > 103* 101* 116*  BUN 15   < > 14   < > 17 15 14   CREATININE 0.94   < > 0.78   < > 0.86 0.88 0.98  CALCIUM 8.7   < > 9.0   < > 8.5* 8.5* 8.6*  GFRNONAA  --   --   --    < > >60 >60 60*  GFRAA  --   --   --    < > >60 >60 >60  PROT 5.8*   < > 6.1   < > 6.4* 6.4* 6.4*  ALBUMIN 3.8   < > 4.0   < > 3.5 3.7 3.6  AST 19   < > 20   < > 24 20 27   ALT 19   < > 16   < > 22 23 23   ALKPHOS 62   < > 59   < > 80 53 62  BILITOT 0.4   < > 0.6   < > 0.4 0.7 0.4  BILIDIR 0.1  --  0.1  --   --   --   --    < > = values in this interval not displayed.   Iron/TIBC/Ferritin/ %Sat No results found for: IRON, TIBC, FERRITIN, IRONPCTSAT    RADIOGRAPHIC STUDIES: I have personally reviewed the radiological images as listed and agreed with the findings in the report.. CT CHEST W CONTRAST  Result Date: 03/07/2019 CLINICAL DATA:  Staging, suspected renal cell carcinoma EXAM: CT CHEST WITH CONTRAST TECHNIQUE: Multidetector CT imaging of the chest was performed during intravenous contrast  administration. CONTRAST:  60mL OMNIPAQUE IOHEXOL 300 MG/ML  SOLN COMPARISON:  None. FINDINGS: Cardiovascular: No significant vascular findings. Normal heart size. No pericardial effusion. Mediastinum/Nodes: No enlarged mediastinal, hilar, or axillary lymph nodes. There is a lobulated, internally calcified mass of the anterior mediastinum which effaces the central portion of the left brachiocephalic vein and very closely abuts the tubular ascending aorta and measures approximately 5.8 x 4.9 x 4.1 cm (series 2, image 58, series 6, image 80). There appears to be adjacent pleural nodularity about the  right upper lobe (series 2, image 39, series 5, image 55). Thyroid gland, trachea, and esophagus demonstrate no significant findings. Lungs/Pleura: There is a 3 mm pulmonary nodule of the right upper lobe (series 3, image 68). 3 mm pulmonary nodule of the anterior right middle lobe (series 3, image 91). No pleural effusion or pneumothorax. Upper Abdomen: No acute abnormality. Hepatic steatosis. Probable bilateral adrenal adenomas, better characterized by prior MRI. Musculoskeletal: No chest wall mass or suspicious bone lesions identified. IMPRESSION: 1. There is a lobulated, internally calcified mass of the anterior mediastinum which effaces the central portion of the left brachiocephalic vein and very closely abuts the tubular ascending aorta, measuring approximately 5.8 x 4.9 x 4.1 cm (series 2, image 58, series 6, image 80). 2. There appears to be adjacent pleural nodularity about the right upper lobe (series 2, image 39, series 5, image 55). 3. Findings are characteristic in location and appearance for thymoma, and apparent involvement of the left brachiocephalic vein and pleura are concerning for invasive thymic carcinoma. Although patient has a presumed renal cell carcinoma better evaluated by prior MRI, metastatic disease is much less favored given this appearance. 4. There is a 3 mm pulmonary nodule of the right  upper lobe (series 3, image 68). 3 mm pulmonary nodule of the anterior right middle lobe (series 3, image 91). These nodules are nonspecific and metastatic disease is not excluded. Attention on follow-up. 5. Hepatic steatosis. 6. Probable bilateral adrenal adenomas, better characterized by prior MRI. Electronically Signed   By: Eddie Candle M.D.   On: 03/07/2019 08:34   MR 3D Recon At Scanner  Addendum Date: 02/21/2019   ADDENDUM REPORT: 02/21/2019 21:37 ADDENDUM: Note that in the impression of the report a statement reads "highly suspicious for renal cell carcinoma including papillary renal cell carcinoma" this should state "highly suspicious for renal cell carcinoma including clear cell renal cell carcinoma" the imaging features are not typical on MRI for papillary renal cell carcinoma. These results will be called to the ordering clinician or representative by the Radiologist Assistant, and communication documented in the PACS or zVision Dashboard. Electronically Signed   By: Zetta Bills M.D.   On: 02/21/2019 21:37   Result Date: 02/21/2019 CLINICAL DATA:  Right upper quadrant pain. Post cholecystectomy with renal lesion discovered on previous exam. EXAM: MRI ABDOMEN WITHOUT AND WITH CONTRAST (INCLUDING MRCP) TECHNIQUE: Multiplanar multisequence MR imaging of the abdomen was performed both before and after the administration of intravenous contrast. Heavily T2-weighted images of the biliary and pancreatic ducts were obtained, and three-dimensional MRCP images were rendered by post processing. CONTRAST:  47mL GADAVIST GADOBUTROL 1 MMOL/ML IV SOLN COMPARISON:  CT study of 02/07/2019 FINDINGS: Lower chest: No signs of effusion or consolidation at the lung bases. Lobular area of diffusion related signal abnormality corresponding to lobular T2 signal abnormality on coronal images best seen on image 13 of series 13 and image 12 of series 13 along the superior aspect of the heart, also seen on post-contrast  imaging, not clearly a vascular structure though certainly in the area of the brachia cephalic vein. Perhaps slightly lower than the expected location of the brachiocephalic vein potentially representing a mediastinal lesion, as large is 3 by 2.4 cm. Hepatobiliary: No signs of focal hepatic lesion. Mild intra and extrahepatic biliary ductal distension following cholecystectomy is not changed. No signs of filling defect in the biliary tree. Pancreas: No mass, inflammatory changes, or other parenchymal abnormality identified. Spleen:  Normal spleen. Adrenals/Urinary Tract: Limited assessment  of nodular adrenal glands that were seen on the previous study due to respiratory motion. Suggestion of signal loss on out of phase image that would be indicative of small adrenal adenomas. The more pronounced thickening of the left adrenal is not as well appreciated on thicker cut MR images that are not protocol for adrenal evaluation. Smooth renal contours bilaterally. Endophytic lesion in the left kidney measuring 3.2 x 2.6 cm. This shows hyperintense features on T2 arising from the lower margin of the posterior hilar lip. Some mild heterogeneity with subtle T1 hypointensity. Note that assessment on today's study is limited due to respiratory motion. There is enhancement postcontrast but without the typical enhancement pattern for renal cell carcinoma including papillary renal cell carcinoma. Though the lesion does appear to be hypervascular. No signs of hydronephrosis. Symmetric renal enhancement. Stomach/Bowel: Bowel is normal to the extent visualized. Pelvic bowel loops are not well assessed. Study not protocol for bowel evaluation. Vascular/Lymphatic: No sign of acute vascular process. Patent abdominal vasculature. No signs of adenopathy in the abdomen. Other:  None. Musculoskeletal: No suspicious bone lesions identified. IMPRESSION: 1. Endophytic lesion in the left kidney measures 3.2 x 2.6 cm. Imaging features are highly  suspicious for renal cell carcinoma including papillary renal cell carcinoma, imaging features not typical for papillary renal cell. 2. No signs of metastatic disease in the abdomen. 3. Adrenal lesions that are more suggestive of adenomas, consider attention on follow-up at 6 months with adrenal protocol CT given the respiratory motion on today's study, depending on results of biopsy if performed of the left kidney. 4. Lobular area along the anterior mediastinum could represent a vascular structure or prominent right atrial appendage, appearance raises the question of mediastinal mass. CT of the chest may be helpful for further assessment. 5. Post cholecystectomy with stable appearance of mild biliary ductal distension likely post cholecystectomy baseline. Electronically Signed: By: Zetta Bills M.D. On: 02/21/2019 21:30   CT BIOPSY  Result Date: 03/28/2019 CLINICAL DATA:  Monoclonal gammopathy of unknown significance. Left lower pole renal mass. EXAM: CT GUIDED NEEDLE ASPIRATE AND CORE BIOPSY OF RIGHT ILIAC BONE MARROW CT-GUIDED CORE BIOPSY, LEFT RENAL MASS ANESTHESIA/SEDATION: Intravenous Fentanyl 51mcg and Versed 1mg  were administered as conscious sedation during continuous monitoring of the patient's level of consciousness and physiological / cardiorespiratory status by the radiology RN, with a total moderate sedation time of 50 minutes. PROCEDURE: The procedure risks, benefits, and alternatives were explained to the patient. Questions regarding the procedure were encouraged and answered. The patient understands and consents to the procedure. Select axial scans through the upper thorax were obtained. The anterior mediastinal mass was localized and an appropriate skin entry site was determined and marked. The operative field was prepped with chlorhexidinein a sterile fashion, and a sterile drape was applied covering the operative field. A sterile gown and sterile gloves were used for the procedure. Local  anesthesia was provided with 1% Lidocaine. Under CT fluoroscopic guidance, a 17 gauge trocar needle was advanced to the margin of the lesion. Once needle tip position was confirmed, coaxial 18-gauge core biopsy samples were obtained, submitted in formalin to surgical pathology. The guide needle was removed. Postprocedure scans show no significant hemorrhage or other apparent complication. No pneumothorax. The patient was then reposition prone. Select axial scans through the retroperitoneum obtained in the left renal mass was localized. An appropriate skin entry site was determined. The operative field was prepped with chlorhexidinein a sterile fashion, and a sterile drape was applied covering the operative  field. A sterile gown and sterile gloves were used for the procedure. Local anesthesia was provided with 1% Lidocaine. Under CT fluoroscopic guidance, a 17 gauge trocar needle was advanced to the margin of the lesion. Once needle tip position was confirmed, coaxial 18-gauge core biopsy samples were obtained, submitted in formalin to surgical pathology. The guide needle was removed. Postprocedure scans show minimal regional retroperitoneal hemorrhage. Patient remained hemodynamically stable. COMPLICATIONS: None immediate FINDINGS: Lobular partially calcified anterior mediastinal mass was localized. Representative core biopsy samples obtained. Slightly hyperdense lower pole left renal mass was localized. Representative core biopsy samples were obtained. IMPRESSION: 1. Technically successful CT-guided core biopsy, anterior mediastinal mass. 2. Technically successful CT-guided core biopsy, left renal mass. Electronically Signed   By: Lucrezia Europe M.D.   On: 03/28/2019 14:26   CT BIOPSY  Result Date: 03/28/2019 CLINICAL DATA:  Monoclonal gammopathy of unknown significance. Left lower pole renal mass. EXAM: CT GUIDED NEEDLE ASPIRATE AND CORE BIOPSY OF RIGHT ILIAC BONE MARROW CT-GUIDED CORE BIOPSY, LEFT RENAL MASS  ANESTHESIA/SEDATION: Intravenous Fentanyl 61mcg and Versed 1mg  were administered as conscious sedation during continuous monitoring of the patient's level of consciousness and physiological / cardiorespiratory status by the radiology RN, with a total moderate sedation time of 50 minutes. PROCEDURE: The procedure risks, benefits, and alternatives were explained to the patient. Questions regarding the procedure were encouraged and answered. The patient understands and consents to the procedure. Select axial scans through the upper thorax were obtained. The anterior mediastinal mass was localized and an appropriate skin entry site was determined and marked. The operative field was prepped with chlorhexidinein a sterile fashion, and a sterile drape was applied covering the operative field. A sterile gown and sterile gloves were used for the procedure. Local anesthesia was provided with 1% Lidocaine. Under CT fluoroscopic guidance, a 17 gauge trocar needle was advanced to the margin of the lesion. Once needle tip position was confirmed, coaxial 18-gauge core biopsy samples were obtained, submitted in formalin to surgical pathology. The guide needle was removed. Postprocedure scans show no significant hemorrhage or other apparent complication. No pneumothorax. The patient was then reposition prone. Select axial scans through the retroperitoneum obtained in the left renal mass was localized. An appropriate skin entry site was determined. The operative field was prepped with chlorhexidinein a sterile fashion, and a sterile drape was applied covering the operative field. A sterile gown and sterile gloves were used for the procedure. Local anesthesia was provided with 1% Lidocaine. Under CT fluoroscopic guidance, a 17 gauge trocar needle was advanced to the margin of the lesion. Once needle tip position was confirmed, coaxial 18-gauge core biopsy samples were obtained, submitted in formalin to surgical pathology. The guide  needle was removed. Postprocedure scans show minimal regional retroperitoneal hemorrhage. Patient remained hemodynamically stable. COMPLICATIONS: None immediate FINDINGS: Lobular partially calcified anterior mediastinal mass was localized. Representative core biopsy samples obtained. Slightly hyperdense lower pole left renal mass was localized. Representative core biopsy samples were obtained. IMPRESSION: 1. Technically successful CT-guided core biopsy, anterior mediastinal mass. 2. Technically successful CT-guided core biopsy, left renal mass. Electronically Signed   By: Lucrezia Europe M.D.   On: 03/28/2019 14:26   MR ABDOMEN MRCP W WO CONTAST  Addendum Date: 02/21/2019   ADDENDUM REPORT: 02/21/2019 21:37 ADDENDUM: Note that in the impression of the report a statement reads "highly suspicious for renal cell carcinoma including papillary renal cell carcinoma" this should state "highly suspicious for renal cell carcinoma including clear cell renal cell carcinoma" the  imaging features are not typical on MRI for papillary renal cell carcinoma. These results will be called to the ordering clinician or representative by the Radiologist Assistant, and communication documented in the PACS or zVision Dashboard. Electronically Signed   By: Zetta Bills M.D.   On: 02/21/2019 21:37   Result Date: 02/21/2019 CLINICAL DATA:  Right upper quadrant pain. Post cholecystectomy with renal lesion discovered on previous exam. EXAM: MRI ABDOMEN WITHOUT AND WITH CONTRAST (INCLUDING MRCP) TECHNIQUE: Multiplanar multisequence MR imaging of the abdomen was performed both before and after the administration of intravenous contrast. Heavily T2-weighted images of the biliary and pancreatic ducts were obtained, and three-dimensional MRCP images were rendered by post processing. CONTRAST:  43mL GADAVIST GADOBUTROL 1 MMOL/ML IV SOLN COMPARISON:  CT study of 02/07/2019 FINDINGS: Lower chest: No signs of effusion or consolidation at the lung bases.  Lobular area of diffusion related signal abnormality corresponding to lobular T2 signal abnormality on coronal images best seen on image 13 of series 13 and image 12 of series 13 along the superior aspect of the heart, also seen on post-contrast imaging, not clearly a vascular structure though certainly in the area of the brachia cephalic vein. Perhaps slightly lower than the expected location of the brachiocephalic vein potentially representing a mediastinal lesion, as large is 3 by 2.4 cm. Hepatobiliary: No signs of focal hepatic lesion. Mild intra and extrahepatic biliary ductal distension following cholecystectomy is not changed. No signs of filling defect in the biliary tree. Pancreas: No mass, inflammatory changes, or other parenchymal abnormality identified. Spleen:  Normal spleen. Adrenals/Urinary Tract: Limited assessment of nodular adrenal glands that were seen on the previous study due to respiratory motion. Suggestion of signal loss on out of phase image that would be indicative of small adrenal adenomas. The more pronounced thickening of the left adrenal is not as well appreciated on thicker cut MR images that are not protocol for adrenal evaluation. Smooth renal contours bilaterally. Endophytic lesion in the left kidney measuring 3.2 x 2.6 cm. This shows hyperintense features on T2 arising from the lower margin of the posterior hilar lip. Some mild heterogeneity with subtle T1 hypointensity. Note that assessment on today's study is limited due to respiratory motion. There is enhancement postcontrast but without the typical enhancement pattern for renal cell carcinoma including papillary renal cell carcinoma. Though the lesion does appear to be hypervascular. No signs of hydronephrosis. Symmetric renal enhancement. Stomach/Bowel: Bowel is normal to the extent visualized. Pelvic bowel loops are not well assessed. Study not protocol for bowel evaluation. Vascular/Lymphatic: No sign of acute vascular  process. Patent abdominal vasculature. No signs of adenopathy in the abdomen. Other:  None. Musculoskeletal: No suspicious bone lesions identified. IMPRESSION: 1. Endophytic lesion in the left kidney measures 3.2 x 2.6 cm. Imaging features are highly suspicious for renal cell carcinoma including papillary renal cell carcinoma, imaging features not typical for papillary renal cell. 2. No signs of metastatic disease in the abdomen. 3. Adrenal lesions that are more suggestive of adenomas, consider attention on follow-up at 6 months with adrenal protocol CT given the respiratory motion on today's study, depending on results of biopsy if performed of the left kidney. 4. Lobular area along the anterior mediastinum could represent a vascular structure or prominent right atrial appendage, appearance raises the question of mediastinal mass. CT of the chest may be helpful for further assessment. 5. Post cholecystectomy with stable appearance of mild biliary ductal distension likely post cholecystectomy baseline. Electronically Signed: By: Zetta Bills  M.D. On: 02/21/2019 21:30       ASSESSMENT & PLAN:  1. Thymoma   2. Renal cell cancer, left ([place])   3. Encounter for antineoplastic chemotherapy   4. MG (myasthenia gravis) ([facility])    #Invasive thymoma, S/p 1 cycle of  reduced carboplatin AUC 4, etoposide day 1 to 3 at 75 mg/m, every 3 weeks. No growth factor support due to being on radiation. She tolerated cycle 1 treatments well. Labs reviewed and discussed with patient. Mild leukopenia with acceptable ANC level. Okay to proceed with cycle 2 carboplatin and etoposide.  # left renal cell carcinoma.  observation for now.  Follow-up with Dr. Erlene Quan. # Myasthenia Gravis.  Hold methotrexate.  Continue pyridostigmine follow-up with neurology.  Continue current regimen.  #Follow-up appointment in 1 week for assessment of tolerability All questions were answered. The patient knows to call the clinic with any  problems questions or concerns.   Earlie Server, MD, PhD Hematology Oncology [facility] Pager- [phone]

## 2019-05-18 NOTE — Progress Notes (Signed)
Patient does not offer any problems today.  

## 2019-05-18 NOTE — Telephone Encounter (Signed)
Patients sister confirmed that she still uses prn. No significant acute symptoms.

## 2019-05-19 ENCOUNTER — Inpatient Hospital Stay: Payer: PPO | Attending: Oncology

## 2019-05-19 ENCOUNTER — Ambulatory Visit
Admission: RE | Admit: 2019-05-19 | Discharge: 2019-05-19 | Disposition: A | Discharge status: Home / Self Care | Payer: PPO | Source: Ambulatory Visit | Attending: Radiation Oncology | Admitting: Radiation Oncology

## 2019-05-19 DIAGNOSIS — Z85820 Personal history of malignant melanoma of skin: Secondary | ICD-10-CM | POA: Diagnosis not present

## 2019-05-19 DIAGNOSIS — C642 Malignant neoplasm of left kidney, except renal pelvis: Secondary | ICD-10-CM | POA: Insufficient documentation

## 2019-05-19 DIAGNOSIS — D4989 Neoplasm of unspecified behavior of other specified sites: Secondary | ICD-10-CM | POA: Diagnosis not present

## 2019-05-19 DIAGNOSIS — D696 Thrombocytopenia, unspecified: Secondary | ICD-10-CM | POA: Insufficient documentation

## 2019-05-19 DIAGNOSIS — Z800 Family history of malignant neoplasm of digestive organs: Secondary | ICD-10-CM | POA: Diagnosis not present

## 2019-05-19 DIAGNOSIS — Z510 Encounter for antineoplastic radiation therapy: Secondary | ICD-10-CM | POA: Insufficient documentation

## 2019-05-19 DIAGNOSIS — Z5112 Encounter for antineoplastic immunotherapy: Secondary | ICD-10-CM | POA: Insufficient documentation

## 2019-05-19 DIAGNOSIS — N3001 Acute cystitis with hematuria: Secondary | ICD-10-CM | POA: Insufficient documentation

## 2019-05-19 DIAGNOSIS — R7989 Other specified abnormal findings of blood chemistry: Secondary | ICD-10-CM | POA: Insufficient documentation

## 2019-05-19 DIAGNOSIS — G7000 Myasthenia gravis without (acute) exacerbation: Secondary | ICD-10-CM | POA: Diagnosis not present

## 2019-05-19 DIAGNOSIS — Z808 Family history of malignant neoplasm of other organs or systems: Secondary | ICD-10-CM | POA: Diagnosis not present

## 2019-05-19 DIAGNOSIS — C37 Malignant neoplasm of thymus: Secondary | ICD-10-CM | POA: Diagnosis not present

## 2019-05-19 MED ORDER — SODIUM CHLORIDE 0.9 % IV SOLN
75.0000 mg/m2 | Freq: Once | INTRAVENOUS | Status: AC
  Administered 2019-05-19: 150 mg via INTRAVENOUS
  Filled 2019-05-19: qty 7.5

## 2019-05-19 MED ORDER — SODIUM CHLORIDE 0.9 % IV SOLN
10.0000 mg | Freq: Once | INTRAVENOUS | Status: AC
  Administered 2019-05-19: 10 mg via INTRAVENOUS
  Filled 2019-05-19: qty 10

## 2019-05-19 MED ORDER — SODIUM CHLORIDE 0.9 % IV SOLN
Freq: Once | INTRAVENOUS | Status: AC
  Filled 2019-05-19: qty 250

## 2019-05-19 NOTE — Telephone Encounter (Signed)
rx ok'd for tessalon perles #30 with no refills.

## 2019-05-20 ENCOUNTER — Other Ambulatory Visit: Payer: Self-pay | Admitting: Internal Medicine

## 2019-05-20 ENCOUNTER — Ambulatory Visit
Admission: RE | Admit: 2019-05-20 | Discharge: 2019-05-20 | Disposition: A | Discharge status: Home / Self Care | Payer: PPO | Source: Ambulatory Visit | Attending: Radiation Oncology | Admitting: Radiation Oncology

## 2019-05-20 ENCOUNTER — Other Ambulatory Visit: Payer: Self-pay

## 2019-05-20 ENCOUNTER — Inpatient Hospital Stay: Payer: PPO

## 2019-05-20 VITALS — BP 123/73 | HR 74 | Temp 97.0°F | Resp 18

## 2019-05-20 DIAGNOSIS — Z5112 Encounter for antineoplastic immunotherapy: Secondary | ICD-10-CM | POA: Diagnosis not present

## 2019-05-20 DIAGNOSIS — Z510 Encounter for antineoplastic radiation therapy: Secondary | ICD-10-CM | POA: Diagnosis not present

## 2019-05-20 DIAGNOSIS — D4989 Neoplasm of unspecified behavior of other specified sites: Secondary | ICD-10-CM

## 2019-05-20 MED ORDER — DEXAMETHASONE SODIUM PHOSPHATE 10 MG/ML IJ SOLN
10.0000 mg | Freq: Once | INTRAMUSCULAR | Status: AC
  Administered 2019-05-20: 10 mg via INTRAVENOUS
  Filled 2019-05-20: qty 1

## 2019-05-20 MED ORDER — SODIUM CHLORIDE 0.9 % IV SOLN
Freq: Once | INTRAVENOUS | Status: AC
  Filled 2019-05-20: qty 250

## 2019-05-20 MED ORDER — SODIUM CHLORIDE 0.9 % IV SOLN: 10.0000 mg | Freq: Once | INTRAVENOUS | Status: DC

## 2019-05-20 MED ORDER — SODIUM CHLORIDE 0.9 % IV SOLN
75.0000 mg/m2 | Freq: Once | INTRAVENOUS | Status: AC
  Administered 2019-05-20: 14:00:00 150 mg via INTRAVENOUS
  Filled 2019-05-20: qty 7.5

## 2019-05-23 ENCOUNTER — Ambulatory Visit
Admission: RE | Admit: 2019-05-23 | Discharge: 2019-05-23 | Disposition: A | Discharge status: Home / Self Care | Payer: PPO | Source: Ambulatory Visit | Attending: Radiation Oncology | Admitting: Radiation Oncology

## 2019-05-23 DIAGNOSIS — Z510 Encounter for antineoplastic radiation therapy: Secondary | ICD-10-CM | POA: Diagnosis not present

## 2019-05-24 ENCOUNTER — Ambulatory Visit
Admission: RE | Admit: 2019-05-24 | Discharge: 2019-05-24 | Disposition: A | Discharge status: Home / Self Care | Payer: PPO | Source: Ambulatory Visit | Attending: Radiation Oncology | Admitting: Radiation Oncology

## 2019-05-24 DIAGNOSIS — Z510 Encounter for antineoplastic radiation therapy: Secondary | ICD-10-CM | POA: Diagnosis not present

## 2019-05-25 ENCOUNTER — Inpatient Hospital Stay: Payer: PPO | Admitting: Oncology

## 2019-05-25 ENCOUNTER — Inpatient Hospital Stay: Payer: PPO

## 2019-05-25 ENCOUNTER — Ambulatory Visit
Admission: RE | Admit: 2019-05-25 | Discharge: 2019-05-25 | Disposition: A | Discharge status: Home / Self Care | Payer: PPO | Source: Ambulatory Visit | Attending: Radiation Oncology | Admitting: Radiation Oncology

## 2019-05-25 DIAGNOSIS — C37 Malignant neoplasm of thymus: Secondary | ICD-10-CM | POA: Diagnosis not present

## 2019-05-25 DIAGNOSIS — Z510 Encounter for antineoplastic radiation therapy: Secondary | ICD-10-CM | POA: Diagnosis not present

## 2019-05-26 ENCOUNTER — Inpatient Hospital Stay (HOSPITAL_BASED_OUTPATIENT_CLINIC_OR_DEPARTMENT_OTHER): Payer: PPO

## 2019-05-26 ENCOUNTER — Ambulatory Visit
Admission: RE | Admit: 2019-05-26 | Discharge: 2019-05-26 | Disposition: A | Discharge status: Home / Self Care | Payer: PPO | Source: Ambulatory Visit | Attending: Radiation Oncology | Admitting: Radiation Oncology

## 2019-05-26 ENCOUNTER — Other Ambulatory Visit: Payer: Self-pay

## 2019-05-26 ENCOUNTER — Inpatient Hospital Stay (HOSPITAL_BASED_OUTPATIENT_CLINIC_OR_DEPARTMENT_OTHER): Payer: PPO | Admitting: Oncology

## 2019-05-26 VITALS — BP 133/87 | HR 88 | Temp 96.5°F | Resp 16 | Wt 217.0 lb

## 2019-05-26 DIAGNOSIS — Z5112 Encounter for antineoplastic immunotherapy: Secondary | ICD-10-CM | POA: Diagnosis not present

## 2019-05-26 DIAGNOSIS — G7000 Myasthenia gravis without (acute) exacerbation: Secondary | ICD-10-CM | POA: Diagnosis not present

## 2019-05-26 DIAGNOSIS — N3001 Acute cystitis with hematuria: Secondary | ICD-10-CM

## 2019-05-26 DIAGNOSIS — R7989 Other specified abnormal findings of blood chemistry: Secondary | ICD-10-CM | POA: Diagnosis not present

## 2019-05-26 DIAGNOSIS — Z510 Encounter for antineoplastic radiation therapy: Secondary | ICD-10-CM | POA: Diagnosis not present

## 2019-05-26 DIAGNOSIS — R300 Dysuria: Secondary | ICD-10-CM

## 2019-05-26 DIAGNOSIS — C642 Malignant neoplasm of left kidney, except renal pelvis: Secondary | ICD-10-CM | POA: Diagnosis not present

## 2019-05-26 DIAGNOSIS — D4989 Neoplasm of unspecified behavior of other specified sites: Secondary | ICD-10-CM

## 2019-05-26 LAB — URINALYSIS, DIPSTICK ONLY
Bacteria, UA: NONE SEEN
Bilirubin Urine: NEGATIVE
Glucose, UA: NEGATIVE mg/dL
Hgb urine dipstick: NEGATIVE
Ketones, ur: NEGATIVE mg/dL
Nitrite: NEGATIVE
Protein, ur: NEGATIVE mg/dL
Specific Gravity, Urine: 1.005 (ref 1.005–1.030)
pH: 6 (ref 5.0–8.0)

## 2019-05-26 LAB — CBC WITH DIFFERENTIAL/PLATELET
Abs Immature Granulocytes: 0.12 10*3/uL — ABNORMAL HIGH (ref 0.00–0.07)
Basophils Absolute: 0 10*3/uL (ref 0.0–0.1)
Basophils Relative: 1 %
Eosinophils Absolute: 0 10*3/uL (ref 0.0–0.5)
Eosinophils Relative: 0 %
HCT: 38 % (ref 36.0–46.0)
Hemoglobin: 12.9 g/dL (ref 12.0–15.0)
Immature Granulocytes: 2 %
Lymphocytes Relative: 13 %
Lymphs Abs: 0.9 10*3/uL (ref 0.7–4.0)
MCH: 32.3 pg (ref 26.0–34.0)
MCHC: 33.9 g/dL (ref 30.0–36.0)
MCV: 95 fL (ref 80.0–100.0)
Monocytes Absolute: 0.2 10*3/uL (ref 0.1–1.0)
Monocytes Relative: 3 %
Neutro Abs: 5.6 10*3/uL (ref 1.7–7.7)
Neutrophils Relative %: 81 %
Platelets: 142 10*3/uL — ABNORMAL LOW (ref 150–400)
RBC: 4 MIL/uL (ref 3.87–5.11)
RDW: 15.4 % (ref 11.5–15.5)
WBC: 6.8 10*3/uL (ref 4.0–10.5)
nRBC: 0 % (ref 0.0–0.2)

## 2019-05-26 LAB — COMPREHENSIVE METABOLIC PANEL
ALT: 25 U/L (ref 0–44)
AST: 20 U/L (ref 15–41)
Albumin: 3.6 g/dL (ref 3.5–5.0)
Alkaline Phosphatase: 50 U/L (ref 38–126)
Anion gap: 9 (ref 5–15)
BUN: 16 mg/dL (ref 8–23)
CO2: 27 mmol/L (ref 22–32)
Calcium: 8.7 mg/dL — ABNORMAL LOW (ref 8.9–10.3)
Chloride: 104 mmol/L (ref 98–111)
Creatinine, Ser: 1.03 mg/dL — ABNORMAL HIGH (ref 0.44–1.00)
GFR calc Af Amer: >60 mL/min (ref >60)
GFR calc non Af Amer: 56 mL/min — ABNORMAL LOW (ref >60)
Glucose, Bld: 111 mg/dL — ABNORMAL HIGH (ref 70–99)
Potassium: 4.2 mmol/L (ref 3.5–5.1)
Sodium: 140 mmol/L (ref 135–145)
Total Bilirubin: 0.9 mg/dL (ref 0.3–1.2)
Total Protein: 6.3 g/dL — ABNORMAL LOW (ref 6.5–8.1)

## 2019-05-26 NOTE — Progress Notes (Signed)
Hematology/Oncology Consult note [facility] [phone] [phone]   Patient Care Team: Einar Pheasant, MD as PCP - General (Internal Medicine) Telford Nab, RN as Oncology Nurse Navigator  REFERRING PROVIDER: Einar Pheasant, MD  CHIEF COMPLAINTS/REASON FOR VISIT:  Follow-up for thymoma  HISTORY OF PRESENTING ILLNESS:   Madison Burns is a  69 y.o.  female with PMH listed below was seen in consultation at the request of  Einar Pheasant, MD  for evaluation of thymoma.  Patient was accompanied by her sister today.  History was obtained from her sister.  Patient has intellectual disability Patient initially underwent a CT abdomen pelvis with contrast on 02/07/2019 for worsening umbilical pain.  It reviewed incidental 3 cm renal mass on the left.  Subsequent MRCP 02/21/2019 performed for the purpose of right upper quadrant pain as well as renal mass in question.  MRI 02/20/2018 showed 3.2 x 2.6 left lower pole endophytic renal mass, consistent with possible renal cell carcinoma.  No signs of metastatic disease in abdomen.  Adrenal lesion more suggestive adenomas.  There was an incidental finding of lobular area along the anterior mediastinum, questionably e mediastinal mass.  CT scan 02/07/2019 showed lobulated internally calcified mass of the anterior mediastinum which effaces the central portion of the left brachiocephalic vein and a very closely abuts the tubular ascending aorta, 5.8 x 4.9 x 4.1 cm.  Adjacent pleural nodularity about the right upper lobe. 3 mm pulmonary nodule of the right upper lobe, hepatic steatosis.  #Patient has a chronic history of myasthenia gravis, initially diagnosed in 2011.  Patient had been treated with prednisone for prolonged period of time.  Recently she has been off prednisone and is currently taking methotrexate and pyridostigmine for symptoms.  Per sister, patient does not complain much of weakness.  At her  baseline.  She is able to feed and dress herself.  She needs some assistance from her sister for bathing.  She likes to watch TV.  Patient has been seen by Dr. Genevive Bi and Dr. Erlene Quan.  Patient's sister would like to entertain the diagnosis and potential therapy. Patient underwent CT-guided needle biopsy of the anterior mediastinal mass, as well as biopsy of the kidney mass Anterior mediastinal mass biopsy pathology showed thymoma, predominantly B2 patent with subsets of a B3 pattern. Left kidney mass showed renal cell carcinoma,tumor cannot be reliably subtyped based on this limited sample and the IHC profile  She has a history of melanoma on her neck in 2017.   So because of the initial appearance of the CT scan we did end up doing a CT chest is Sister is POA. Patient lives with sister  INTERVAL HISTORY Madison Burns is a 69 y.o. female who has above history reviewed by me today presents for follow up visit for management of malignant thymoma Problems and complaints are listed below: S/p 2 cycles of carboplatin and etoposide On concurrent Radiation.  No fever chills, nausea or vomiting. Per sister, patient has complained "burning sensation around her private area". Patient drinks good amount of fluid and goes to bathroom frequently. Sister found small amount of blood on her pull up this morning. occasionally she has lower abdomen pain, no exacerbating factors. She says pain gets better after bowel movements.    Review of Systems  Unable to perform ROS: Other (Intellectual Disability)  Gastrointestinal: Negative for nausea.  Genitourinary: Positive for dysuria and frequency.     MEDICAL HISTORY:  Past Medical History:  Diagnosis Date  Cancer of kidney ([place]) 2021   GERD (gastroesophageal reflux disease)    History of seizure disorder    Hypercholesterolemia    Hypertension    Irritable bowel syndrome    Myasthenia gravis ([place]) 2011   OSA on CPAP    Skin cancer 2017    Melanoma on neck    SURGICAL HISTORY: Past Surgical History:  Procedure Laterality Date   [place]  2013   COLONOSCOPY WITH PROPOFOL N/A 06/30/2017   Procedure: COLONOSCOPY WITH PROPOFOL;  Surgeon: Toledo, Benay Pike, MD;  Location: [facility] ENDOSCOPY;  Service: Gastroenterology;  Laterality: N/A;   ESOPHAGOGASTRODUODENOSCOPY (EGD) WITH PROPOFOL N/A 06/30/2017   Procedure: ESOPHAGOGASTRODUODENOSCOPY (EGD) WITH PROPOFOL;  Surgeon: Toledo, Benay Pike, MD;  Location: [facility] ENDOSCOPY;  Service: Gastroenterology;  Laterality: N/A;   TONSILLECTOMY  1959    SOCIAL HISTORY: Social History   Socioeconomic History   Marital status: Single    Spouse name: Not on file   Number of children: 0   Years of education: Special Ed   Highest education level: Not on file  Occupational History   Occupation: Unemployed  Tobacco Use   Smoking status: Never Smoker   Smokeless tobacco: Never Used  Substance and Sexual Activity   Alcohol use: No    Alcohol/week: 0.0 standard drinks   Drug use: No   Sexual activity: Not Currently  Other Topics Concern   Not on file  Social History Narrative   06/23/17 lives with sister, [name]   Regular exercise-no   Caffeine Use-yes   Social Determinants of Health   Financial Resource Strain:    Difficulty of Paying Living Expenses:   Food Insecurity:    Worried About Charity fundraiser in the Last Year:    Arboriculturist in the Last Year:   Transportation Needs:    Film/video editor (Medical):    Lack of Transportation (Non-Medical):   Physical Activity:    Days of Exercise per Week:    Minutes of Exercise per Session:   Stress:    Feeling of Stress :   Social Connections:    Frequency of Communication with Friends and Family:    Frequency of Social Gatherings with Friends and Family:    Attends Religious Services:    Active Member of Clubs or Organizations:     Attends Music therapist:    Marital Status:   Intimate Partner Violence:    Fear of Current or Ex-Partner:    Emotionally Abused:    Physically Abused:    Sexually Abused:     FAMILY HISTORY: Family History  Problem Relation Age of Onset   Colon cancer Maternal Grandfather        stomach/colon   Heart disease Father        myocardial infarction   Hyperlipidemia Father    Hyperlipidemia Sister    Diabetes Sister    Bone cancer Other        cousin   Alzheimer's disease Mother        maternal aunts   Skin cancer Mother    Myasthenia gravis Brother        ocular   Skin cancer Brother    Stroke Paternal Grandfather    Breast cancer Neg Hx     ALLERGIES:  is allergic to ciprofloxacin and levofloxacin.  MEDICATIONS:  Current Outpatient Medications  Medication Sig Dispense Refill   Ascorbic Acid (VITAMIN C) 1000  MG tablet Take 1,000 mg by mouth daily.     b complex vitamins tablet Take 1 tablet by mouth daily.     benzonatate (TESSALON) 100 MG capsule TAKE 1 CAPSULE BY MOUTH TWICE DAILY AS NEEDED COUGH 30 capsule 0   Biotin 10000 MCG TABS Take 1 capsule by mouth daily.     Calcium-Phosphorus-Vitamin D (CITRACAL +D3 PO) Take 25-1,000 mcg by mouth daily.     dexamethasone (DECADRON) 4 MG tablet Take 2 tablets (8 mg total) by mouth daily. Start the day after chemotherapy for 1 day. (Patient not taking: Reported on 04/27/2019) 30 tablet 1   folic acid (FOLVITE) 1 MG tablet Take 2 tablets (2 mg total) by mouth daily. 180 tablet 3   levothyroxine (SYNTHROID) 75 MCG tablet TAKE 1 TABLET BY MOUTH DAILY ON AN EMPTYSTOMACH. WAIT 30 MINUTES BEFORE TAKING OTHER MEDS 90 tablet 1   methotrexate (RHEUMATREX) 2.5 MG tablet Take 4 tablets (10 mg total) by mouth once a week. Caution: Protect from light. (Patient not taking: Reported on 04/27/2019) 16 tablet 3   ondansetron (ZOFRAN) 8 MG tablet Take 1 tablet (8 mg total) by mouth 2 (two) times daily as needed  for refractory nausea / vomiting. Start on day 3 after carboplatin chemo. 30 tablet 1   OVER THE COUNTER MEDICATION Take 1 tablet by mouth daily. BRAIN ENERGIZER     pantoprazole (PROTONIX) 40 MG tablet 40 mg daily.     Probiotic Product (ALIGN PO) Take 1 capsule by mouth daily.     prochlorperazine (COMPAZINE) 10 MG tablet Take 1 tablet (10 mg total) by mouth every 6 (six) hours as needed (Nausea or vomiting). 30 tablet 1   pyridostigmine (MESTINON) 60 MG tablet TAKE 1 TABLET BY MOUTH AT  8 AM, ONE AT LUNCH AND ONE AT 8PM 90 tablet 6   risperiDONE (RISPERDAL) 2 MG tablet      sucralfate (CARAFATE) 1 g tablet Take 1 g by mouth 2 (two) times daily.     tolterodine (DETROL LA) 4 MG 24 hr capsule TAKE 1 CAPSULE BY MOUTH ONCE EVERY MORNING 90 capsule 1   No current facility-administered medications for this visit.     PHYSICAL EXAMINATION: ECOG PERFORMANCE STATUS: 1 - Symptomatic but completely ambulatory Vitals:   05/26/19 1011  BP: 133/87  Pulse: 88  Resp: 16  Temp: (!) 96.5 F (35.8 C)   Filed Weights   05/26/19 1011  Weight: 217 lb (98.4 kg)    Physical Exam Constitutional:      General: She is not in acute distress.    Appearance: She is obese.  HENT:     Head: Normocephalic and atraumatic.  Eyes:     General: No scleral icterus. Cardiovascular:     Rate and Rhythm: Normal rate and regular rhythm.     Heart sounds: Normal heart sounds.  Pulmonary:     Effort: Pulmonary effort is normal. No respiratory distress.     Breath sounds: No wheezing.  Abdominal:     General: Bowel sounds are normal. There is no distension.     Palpations: Abdomen is soft.  Musculoskeletal:        General: No deformity. Normal range of motion.     Cervical back: Normal range of motion and neck supple.  Skin:    General: Skin is warm and dry.     Findings: No erythema or rash.  Neurological:     General: No focal deficit present.     Mental  Status: She is alert.     Cranial  Nerves: No cranial nerve deficit.     Coordination: Coordination normal.     Comments: Oriented in person Muscle strength, 4/5 all extremities.   Psychiatric:        Mood and Affect: Mood normal.     LABORATORY DATA:  I have reviewed the data as listed Lab Results  Component Value Date   WBC 6.8 05/26/2019   HGB 12.9 05/26/2019   HCT 38.0 05/26/2019   MCV 95.0 05/26/2019   PLT 142 (L) 05/26/2019   Recent Labs    08/06/18 1007 08/06/18 1007 12/14/18 0857 01/01/19 1728 05/06/19 1020 05/18/19 0811 05/26/19 0920  NA 141   < > 141   < > 138 138 140  K 3.8   < > 3.6   < > 3.9 3.6 4.2  CL 107   < > 105   < > 103 107 104  CO2 27   < > 30   < > 26 24 27   GLUCOSE 88   < > 120*   < > 101* 116* 111*  BUN 15   < > 14   < > 15 14 16   CREATININE 0.94   < > 0.78   < > 0.88 0.98 1.03*  CALCIUM 8.7   < > 9.0   < > 8.5* 8.6* 8.7*  GFRNONAA  --   --   --    < > >60 60* 56*  GFRAA  --   --   --    < > >60 >60 >60  PROT 5.8*   < > 6.1   < > 6.4* 6.4* 6.3*  ALBUMIN 3.8   < > 4.0   < > 3.7 3.6 3.6  AST 19   < > 20   < > 20 27 20   ALT 19   < > 16   < > 23 23 25   ALKPHOS 62   < > 59   < > 53 62 50  BILITOT 0.4   < > 0.6   < > 0.7 0.4 0.9  BILIDIR 0.1  --  0.1  --   --   --   --    < > = values in this interval not displayed.   Iron/TIBC/Ferritin/ %Sat No results found for: IRON, TIBC, FERRITIN, IRONPCTSAT    RADIOGRAPHIC STUDIES: I have personally reviewed the radiological images as listed and agreed with the findings in the report.. CT CHEST W CONTRAST  Result Date: 03/07/2019 CLINICAL DATA:  Staging, suspected renal cell carcinoma EXAM: CT CHEST WITH CONTRAST TECHNIQUE: Multidetector CT imaging of the chest was performed during intravenous contrast administration. CONTRAST:  41mL OMNIPAQUE IOHEXOL 300 MG/ML  SOLN COMPARISON:  None. FINDINGS: Cardiovascular: No significant vascular findings. Normal heart size. No pericardial effusion. Mediastinum/Nodes: No enlarged mediastinal, hilar,  or axillary lymph nodes. There is a lobulated, internally calcified mass of the anterior mediastinum which effaces the central portion of the left brachiocephalic vein and very closely abuts the tubular ascending aorta and measures approximately 5.8 x 4.9 x 4.1 cm (series 2, image 58, series 6, image 80). There appears to be adjacent pleural nodularity about the right upper lobe (series 2, image 39, series 5, image 55). Thyroid gland, trachea, and esophagus demonstrate no significant findings. Lungs/Pleura: There is a 3 mm pulmonary nodule of the right upper lobe (series 3, image 68). 3 mm pulmonary nodule of the anterior right middle lobe (series 3, image  91). No pleural effusion or pneumothorax. Upper Abdomen: No acute abnormality. Hepatic steatosis. Probable bilateral adrenal adenomas, better characterized by prior MRI. Musculoskeletal: No chest wall mass or suspicious bone lesions identified. IMPRESSION: 1. There is a lobulated, internally calcified mass of the anterior mediastinum which effaces the central portion of the left brachiocephalic vein and very closely abuts the tubular ascending aorta, measuring approximately 5.8 x 4.9 x 4.1 cm (series 2, image 58, series 6, image 80). 2. There appears to be adjacent pleural nodularity about the right upper lobe (series 2, image 39, series 5, image 55). 3. Findings are characteristic in location and appearance for thymoma, and apparent involvement of the left brachiocephalic vein and pleura are concerning for invasive thymic carcinoma. Although patient has a presumed renal cell carcinoma better evaluated by prior MRI, metastatic disease is much less favored given this appearance. 4. There is a 3 mm pulmonary nodule of the right upper lobe (series 3, image 68). 3 mm pulmonary nodule of the anterior right middle lobe (series 3, image 91). These nodules are nonspecific and metastatic disease is not excluded. Attention on follow-up. 5. Hepatic steatosis. 6. Probable  bilateral adrenal adenomas, better characterized by prior MRI. Electronically Signed   By: Eddie Candle M.D.   On: 03/07/2019 08:34   CT BIOPSY  Result Date: 03/28/2019 CLINICAL DATA:  Monoclonal gammopathy of unknown significance. Left lower pole renal mass. EXAM: CT GUIDED NEEDLE ASPIRATE AND CORE BIOPSY OF RIGHT ILIAC BONE MARROW CT-GUIDED CORE BIOPSY, LEFT RENAL MASS ANESTHESIA/SEDATION: Intravenous Fentanyl 48mcg and Versed 1mg  were administered as conscious sedation during continuous monitoring of the patient's level of consciousness and physiological / cardiorespiratory status by the radiology RN, with a total moderate sedation time of 50 minutes. PROCEDURE: The procedure risks, benefits, and alternatives were explained to the patient. Questions regarding the procedure were encouraged and answered. The patient understands and consents to the procedure. Select axial scans through the upper thorax were obtained. The anterior mediastinal mass was localized and an appropriate skin entry site was determined and marked. The operative field was prepped with chlorhexidinein a sterile fashion, and a sterile drape was applied covering the operative field. A sterile gown and sterile gloves were used for the procedure. Local anesthesia was provided with 1% Lidocaine. Under CT fluoroscopic guidance, a 17 gauge trocar needle was advanced to the margin of the lesion. Once needle tip position was confirmed, coaxial 18-gauge core biopsy samples were obtained, submitted in formalin to surgical pathology. The guide needle was removed. Postprocedure scans show no significant hemorrhage or other apparent complication. No pneumothorax. The patient was then reposition prone. Select axial scans through the retroperitoneum obtained in the left renal mass was localized. An appropriate skin entry site was determined. The operative field was prepped with chlorhexidinein a sterile fashion, and a sterile drape was applied covering the  operative field. A sterile gown and sterile gloves were used for the procedure. Local anesthesia was provided with 1% Lidocaine. Under CT fluoroscopic guidance, a 17 gauge trocar needle was advanced to the margin of the lesion. Once needle tip position was confirmed, coaxial 18-gauge core biopsy samples were obtained, submitted in formalin to surgical pathology. The guide needle was removed. Postprocedure scans show minimal regional retroperitoneal hemorrhage. Patient remained hemodynamically stable. COMPLICATIONS: None immediate FINDINGS: Lobular partially calcified anterior mediastinal mass was localized. Representative core biopsy samples obtained. Slightly hyperdense lower pole left renal mass was localized. Representative core biopsy samples were obtained. IMPRESSION: 1. Technically successful CT-guided core biopsy,  anterior mediastinal mass. 2. Technically successful CT-guided core biopsy, left renal mass. Electronically Signed   By: Lucrezia Europe M.D.   On: 03/28/2019 14:26   CT BIOPSY  Result Date: 03/28/2019 CLINICAL DATA:  Monoclonal gammopathy of unknown significance. Left lower pole renal mass. EXAM: CT GUIDED NEEDLE ASPIRATE AND CORE BIOPSY OF RIGHT ILIAC BONE MARROW CT-GUIDED CORE BIOPSY, LEFT RENAL MASS ANESTHESIA/SEDATION: Intravenous Fentanyl 81mcg and Versed 1mg  were administered as conscious sedation during continuous monitoring of the patient's level of consciousness and physiological / cardiorespiratory status by the radiology RN, with a total moderate sedation time of 50 minutes. PROCEDURE: The procedure risks, benefits, and alternatives were explained to the patient. Questions regarding the procedure were encouraged and answered. The patient understands and consents to the procedure. Select axial scans through the upper thorax were obtained. The anterior mediastinal mass was localized and an appropriate skin entry site was determined and marked. The operative field was prepped with  chlorhexidinein a sterile fashion, and a sterile drape was applied covering the operative field. A sterile gown and sterile gloves were used for the procedure. Local anesthesia was provided with 1% Lidocaine. Under CT fluoroscopic guidance, a 17 gauge trocar needle was advanced to the margin of the lesion. Once needle tip position was confirmed, coaxial 18-gauge core biopsy samples were obtained, submitted in formalin to surgical pathology. The guide needle was removed. Postprocedure scans show no significant hemorrhage or other apparent complication. No pneumothorax. The patient was then reposition prone. Select axial scans through the retroperitoneum obtained in the left renal mass was localized. An appropriate skin entry site was determined. The operative field was prepped with chlorhexidinein a sterile fashion, and a sterile drape was applied covering the operative field. A sterile gown and sterile gloves were used for the procedure. Local anesthesia was provided with 1% Lidocaine. Under CT fluoroscopic guidance, a 17 gauge trocar needle was advanced to the margin of the lesion. Once needle tip position was confirmed, coaxial 18-gauge core biopsy samples were obtained, submitted in formalin to surgical pathology. The guide needle was removed. Postprocedure scans show minimal regional retroperitoneal hemorrhage. Patient remained hemodynamically stable. COMPLICATIONS: None immediate FINDINGS: Lobular partially calcified anterior mediastinal mass was localized. Representative core biopsy samples obtained. Slightly hyperdense lower pole left renal mass was localized. Representative core biopsy samples were obtained. IMPRESSION: 1. Technically successful CT-guided core biopsy, anterior mediastinal mass. 2. Technically successful CT-guided core biopsy, left renal mass. Electronically Signed   By: Lucrezia Europe M.D.   On: 03/28/2019 14:26       ASSESSMENT & PLAN:  1. Renal cell cancer, left ([place])   2. Acute  cystitis with hematuria   3. MG (myasthenia gravis) ([facility])   4. Elevated serum creatinine    #Invasive thymoma, S/p 2 cycle of  reduced carboplatin AUC 4, etoposide day 1 to 3 at 75 mg/m, every 3 weeks. Currently on concurrent radiation.  Labs are reviewed and discussed with patient.  # Dysuria, blood on pull up Check UA and urine culture.  UA showed small amount of leukocytes. No nitrates. Urine culture grew E coli, 40,000 colonies. I will start patient on Augmentin x 5 days. -called patient's sister and communicated the plan with her.    #slightly elevated creatinine, encourage oral hydration. Monitor.   # left renal cell carcinoma.  observation for now.  Follow-up with Dr. Erlene Quan. # Myasthenia Gravis.  Hold methotrexate.  Continue pyridostigmine follow-up with neurology.  Continue current regimen.  #Follow-up appointment in 1 week  for assessment of tolerability All questions were answered. The patient knows to call the clinic with any problems questions or concerns.   Earlie Server, MD, PhD Hematology Oncology [facility] Pager- [phone]

## 2019-05-26 NOTE — Progress Notes (Signed)
Patient noticed blood in her pull up yesterday morning and has not seen anymore.  Does report occasional low abdominal pain.

## 2019-05-27 ENCOUNTER — Ambulatory Visit
Admission: RE | Admit: 2019-05-27 | Discharge: 2019-05-27 | Disposition: A | Discharge status: Home / Self Care | Payer: PPO | Source: Ambulatory Visit | Attending: Radiation Oncology | Admitting: Radiation Oncology

## 2019-05-27 DIAGNOSIS — Z510 Encounter for antineoplastic radiation therapy: Secondary | ICD-10-CM | POA: Diagnosis not present

## 2019-05-28 ENCOUNTER — Encounter: Payer: Self-pay | Admitting: Oncology

## 2019-05-28 LAB — URINE CULTURE: Culture: 40000 — AB

## 2019-05-28 MED ORDER — AMOXICILLIN-POT CLAVULANATE 875-125 MG PO TABS: 1.0000 | ORAL_TABLET | Freq: Two times a day (BID) | ORAL | 0 refills | Status: DC

## 2019-05-30 ENCOUNTER — Ambulatory Visit
Admission: RE | Admit: 2019-05-30 | Discharge: 2019-05-30 | Disposition: A | Discharge status: Home / Self Care | Payer: PPO | Source: Ambulatory Visit | Attending: Radiation Oncology | Admitting: Radiation Oncology

## 2019-05-30 ENCOUNTER — Telehealth: Payer: Self-pay

## 2019-05-30 DIAGNOSIS — Z510 Encounter for antineoplastic radiation therapy: Secondary | ICD-10-CM | POA: Diagnosis not present

## 2019-05-30 NOTE — Telephone Encounter (Signed)
-----   Message from Earlie Server, MD sent at 05/28/2019  8:48 PM EDT ----- I called her sister and started patient on Augmentin. Please schedule her to follow up with me 1 week from last viist. Thanks. Lab md cbc cmp

## 2019-05-31 ENCOUNTER — Ambulatory Visit
Admission: RE | Admit: 2019-05-31 | Discharge: 2019-05-31 | Disposition: A | Discharge status: Home / Self Care | Payer: PPO | Source: Ambulatory Visit | Attending: Radiation Oncology | Admitting: Radiation Oncology

## 2019-05-31 DIAGNOSIS — Z510 Encounter for antineoplastic radiation therapy: Secondary | ICD-10-CM | POA: Diagnosis not present

## 2019-06-01 ENCOUNTER — Inpatient Hospital Stay: Payer: PPO

## 2019-06-01 ENCOUNTER — Inpatient Hospital Stay (HOSPITAL_BASED_OUTPATIENT_CLINIC_OR_DEPARTMENT_OTHER): Payer: PPO | Admitting: Oncology

## 2019-06-01 ENCOUNTER — Other Ambulatory Visit: Payer: Self-pay

## 2019-06-01 ENCOUNTER — Ambulatory Visit: Admission: RE | Admit: 2019-06-01 | Payer: PPO | Source: Ambulatory Visit

## 2019-06-01 ENCOUNTER — Ambulatory Visit: Payer: PPO

## 2019-06-01 ENCOUNTER — Encounter: Payer: Self-pay | Admitting: Oncology

## 2019-06-01 VITALS — BP 121/83 | HR 86 | Temp 97.4°F | Resp 18 | Wt 208.1 lb

## 2019-06-01 DIAGNOSIS — D4989 Neoplasm of unspecified behavior of other specified sites: Secondary | ICD-10-CM | POA: Diagnosis not present

## 2019-06-01 DIAGNOSIS — N3001 Acute cystitis with hematuria: Secondary | ICD-10-CM

## 2019-06-01 DIAGNOSIS — R7989 Other specified abnormal findings of blood chemistry: Secondary | ICD-10-CM | POA: Diagnosis not present

## 2019-06-01 DIAGNOSIS — D696 Thrombocytopenia, unspecified: Secondary | ICD-10-CM

## 2019-06-01 DIAGNOSIS — Z5112 Encounter for antineoplastic immunotherapy: Secondary | ICD-10-CM | POA: Diagnosis not present

## 2019-06-01 LAB — COMPREHENSIVE METABOLIC PANEL
ALT: 23 U/L (ref 0–44)
AST: 23 U/L (ref 15–41)
Albumin: 3.9 g/dL (ref 3.5–5.0)
Alkaline Phosphatase: 53 U/L (ref 38–126)
Anion gap: 9 (ref 5–15)
BUN: 20 mg/dL (ref 8–23)
CO2: 26 mmol/L (ref 22–32)
Calcium: 8.8 mg/dL — ABNORMAL LOW (ref 8.9–10.3)
Chloride: 104 mmol/L (ref 98–111)
Creatinine, Ser: 0.93 mg/dL (ref 0.44–1.00)
GFR calc Af Amer: >60 mL/min (ref >60)
GFR calc non Af Amer: >60 mL/min (ref >60)
Glucose, Bld: 98 mg/dL (ref 70–99)
Potassium: 3.7 mmol/L (ref 3.5–5.1)
Sodium: 139 mmol/L (ref 135–145)
Total Bilirubin: 0.7 mg/dL (ref 0.3–1.2)
Total Protein: 6.6 g/dL (ref 6.5–8.1)

## 2019-06-01 LAB — CBC WITH DIFFERENTIAL/PLATELET
Abs Immature Granulocytes: 0.01 10*3/uL (ref 0.00–0.07)
Basophils Absolute: 0 10*3/uL (ref 0.0–0.1)
Basophils Relative: 0 %
Eosinophils Absolute: 0 10*3/uL (ref 0.0–0.5)
Eosinophils Relative: 0 %
HCT: 39.6 % (ref 36.0–46.0)
Hemoglobin: 13.2 g/dL (ref 12.0–15.0)
Immature Granulocytes: 0 %
Lymphocytes Relative: 28 %
Lymphs Abs: 0.7 10*3/uL (ref 0.7–4.0)
MCH: 31.7 pg (ref 26.0–34.0)
MCHC: 33.3 g/dL (ref 30.0–36.0)
MCV: 95.2 fL (ref 80.0–100.0)
Monocytes Absolute: 0.3 10*3/uL (ref 0.1–1.0)
Monocytes Relative: 13 %
Neutro Abs: 1.4 10*3/uL — ABNORMAL LOW (ref 1.7–7.7)
Neutrophils Relative %: 59 %
Platelets: 85 10*3/uL — ABNORMAL LOW (ref 150–400)
RBC: 4.16 MIL/uL (ref 3.87–5.11)
RDW: 16 % — ABNORMAL HIGH (ref 11.5–15.5)
WBC: 2.4 10*3/uL — ABNORMAL LOW (ref 4.0–10.5)
nRBC: 0 % (ref 0.0–0.2)

## 2019-06-01 NOTE — Progress Notes (Signed)
Patient here for follow up. Pt continues on antibiotic for UTI. Pt's sister reports that patient is having frequency in urination.

## 2019-06-02 ENCOUNTER — Ambulatory Visit
Admission: RE | Admit: 2019-06-02 | Discharge: 2019-06-02 | Disposition: A | Discharge status: Home / Self Care | Payer: PPO | Source: Ambulatory Visit | Attending: Radiation Oncology | Admitting: Radiation Oncology

## 2019-06-02 DIAGNOSIS — C37 Malignant neoplasm of thymus: Secondary | ICD-10-CM | POA: Diagnosis not present

## 2019-06-02 DIAGNOSIS — Z510 Encounter for antineoplastic radiation therapy: Secondary | ICD-10-CM | POA: Diagnosis not present

## 2019-06-02 NOTE — Progress Notes (Signed)
Hematology/Oncology follow up  note [facility] Telephone:([phone] [phone]   Patient Care Team: Einar Pheasant, MD as PCP - General (Internal Medicine) Telford Nab, RN as Oncology Nurse Navigator  REFERRING PROVIDER: Einar Pheasant, MD  CHIEF COMPLAINTS/REASON FOR VISIT:  Follow-up for thymoma  HISTORY OF PRESENTING ILLNESS:   Madison Burns is a  69 y.o.  female with PMH listed below was seen in consultation at the request of  Einar Pheasant, MD  for evaluation of thymoma.  Patient was accompanied by her sister today.  History was obtained from her sister.  Patient has intellectual disability Patient initially underwent a CT abdomen pelvis with contrast on 02/07/2019 for worsening umbilical pain.  It reviewed incidental 3 cm renal mass on the left.  Subsequent MRCP 02/21/2019 performed for the purpose of right upper quadrant pain as well as renal mass in question.  MRI 02/20/2018 showed 3.2 x 2.6 left lower pole endophytic renal mass, consistent with possible renal cell carcinoma.  No signs of metastatic disease in abdomen.  Adrenal lesion more suggestive adenomas.  There was an incidental finding of lobular area along the anterior mediastinum, questionably e mediastinal mass.  CT scan 02/07/2019 showed lobulated internally calcified mass of the anterior mediastinum which effaces the central portion of the left brachiocephalic vein and a very closely abuts the tubular ascending aorta, 5.8 x 4.9 x 4.1 cm.  Adjacent pleural nodularity about the right upper lobe. 3 mm pulmonary nodule of the right upper lobe, hepatic steatosis.  #Patient has a chronic history of myasthenia gravis, initially diagnosed in 2011.  Patient had been treated with prednisone for prolonged period of time.  Recently she has been off prednisone and is currently taking methotrexate and pyridostigmine for symptoms.  Per sister, patient does not complain much of weakness.  At her  baseline.  She is able to feed and dress herself.  She needs some assistance from her sister for bathing.  She likes to watch TV.  Patient has been seen by Dr. Genevive Bi and Dr. Erlene Quan.  Patient's sister would like to entertain the diagnosis and potential therapy. Patient underwent CT-guided needle biopsy of the anterior mediastinal mass, as well as biopsy of the kidney mass Anterior mediastinal mass biopsy pathology showed thymoma, predominantly B2 patent with subsets of a B3 pattern. Left kidney mass showed renal cell carcinoma,tumor cannot be reliably subtyped based on this limited sample and the IHC profile  She has a history of melanoma on her neck in 2017.   So because of the initial appearance of the CT scan we did end up doing a CT chest is Sister is POA. Patient lives with sister  INTERVAL HISTORY Madison Burns is a 69 y.o. female who has above history reviewed by me today presents for follow up visit for management of malignant thymoma Problems and complaints are listed below: S/p 2 cycles of carboplatin and etoposide On concurrent Radiation.  No new complaints.  Patient took antibiotics for UTI and persistent her symptoms are better.  No fever or chills.  Review of Systems  Unable to perform ROS: Other (Intellectual Disability)  Gastrointestinal: Negative for diarrhea and nausea.  Genitourinary: Negative for dysuria and frequency.     MEDICAL HISTORY:  Past Medical History:  Diagnosis Date  . Cancer of kidney ([place]) 2021  . GERD (gastroesophageal reflux disease)   . History of seizure disorder   . Hypercholesterolemia   . Hypertension   . Irritable bowel syndrome   .  Myasthenia gravis ([place]) 2011  . OSA on CPAP   . Skin cancer 2017   Melanoma on neck    SURGICAL HISTORY: Past Surgical History:  Procedure Laterality Date  . ABDOMINAL HYSTERECTOMY  1990  . APPENDECTOMY  1999  . CHOLECYSTECTOMY  2013  . COLONOSCOPY WITH PROPOFOL N/A 06/30/2017   Procedure: COLONOSCOPY  WITH PROPOFOL;  Surgeon: Toledo, Benay Pike, MD;  Location: [facility];  Service: Gastroenterology;  Laterality: N/A;  . ESOPHAGOGASTRODUODENOSCOPY (EGD) WITH PROPOFOL N/A 06/30/2017   Procedure: ESOPHAGOGASTRODUODENOSCOPY (EGD) WITH PROPOFOL;  Surgeon: Toledo, Benay Pike, MD;  Location: [facility] ENDOSCOPY;  Service: Gastroenterology;  Laterality: N/A;  . TONSILLECTOMY  1959    SOCIAL HISTORY: Social History   Socioeconomic History  . Marital status: Single    Spouse name: Not on file  . Number of children: 0  . Years of education: Special Ed  . Highest education level: Not on file  Occupational History  . Occupation: Unemployed  Tobacco Use  . Smoking status: Never Smoker  . Smokeless tobacco: Never Used  Substance and Sexual Activity  . Alcohol use: No    Alcohol/week: 0.0 standard drinks  . Drug use: No  . Sexual activity: Not Currently  Other Topics Concern  . Not on file  Social History Narrative   06/23/17 lives with sister, [name]   Regular exercise-no   Caffeine Use-yes   Social Determinants of Health   Financial Resource Strain:   . Difficulty of Paying Living Expenses:   Food Insecurity:   . Worried About Charity fundraiser in the Last Year:   . Arboriculturist in the Last Year:   Transportation Needs:   . Film/video editor (Medical):   [name] Lack of Transportation (Non-Medical):   Physical Activity:   . Days of Exercise per Week:   . Minutes of Exercise per Session:   Stress:   . Feeling of Stress :   Social Connections:   . Frequency of Communication with Friends and Family:   . Frequency of Social Gatherings with Friends and Family:   . Attends Religious Services:   . Active Member of Clubs or Organizations:   . Attends Archivist Meetings:   [name] Marital Status:   Intimate Partner Violence:   . Fear of Current or Ex-Partner:   . Emotionally Abused:   [name] Physically Abused:   . Sexually Abused:     FAMILY HISTORY: Family History  Problem  Relation Age of Onset  . Colon cancer Maternal Grandfather        stomach/colon  . Heart disease Father        myocardial infarction  . Hyperlipidemia Father   . Hyperlipidemia Sister   . Diabetes Sister   . Bone cancer Other        cousin  . Alzheimer's disease Mother        maternal aunts  . Skin cancer Mother   . Myasthenia gravis Brother        ocular  . Skin cancer Brother   . Stroke Paternal Grandfather   . Breast cancer Neg Hx     ALLERGIES:  is allergic to ciprofloxacin and levofloxacin.  MEDICATIONS:  Current Outpatient Medications  Medication Sig Dispense Refill  . amoxicillin-clavulanate (AUGMENTIN) 875-125 MG tablet Take 1 tablet by mouth 2 (two) times daily. 10 tablet 0  . Ascorbic Acid (VITAMIN C) 1000 MG tablet Take 1,000 mg by mouth daily.    [name] b complex vitamins tablet  Take 1 tablet by mouth daily.    . benzonatate (TESSALON) 100 MG capsule TAKE 1 CAPSULE BY MOUTH TWICE DAILY AS NEEDED COUGH 30 capsule 0  . Biotin 10000 MCG TABS Take 1 capsule by mouth daily.    . Calcium-Phosphorus-Vitamin D (CITRACAL +D3 PO) Take 25-1,000 mcg by mouth daily.    . folic acid (FOLVITE) 1 MG tablet Take 2 tablets (2 mg total) by mouth daily. 180 tablet 3  . levothyroxine (SYNTHROID) 75 MCG tablet TAKE 1 TABLET BY MOUTH DAILY ON AN EMPTYSTOMACH. WAIT 30 MINUTES BEFORE TAKING OTHER MEDS 90 tablet 1  . OVER THE COUNTER MEDICATION Take 1 tablet by mouth daily. BRAIN ENERGIZER    . pantoprazole (PROTONIX) 40 MG tablet 40 mg daily.    . Probiotic Product (ALIGN PO) Take 1 capsule by mouth daily.    . prochlorperazine (COMPAZINE) 10 MG tablet Take 1 tablet (10 mg total) by mouth every 6 (six) hours as needed (Nausea or vomiting). 30 tablet 1  . pyridostigmine (MESTINON) 60 MG tablet TAKE 1 TABLET BY MOUTH AT  8 AM, ONE AT LUNCH AND ONE AT 8PM 90 tablet 6  . risperiDONE (RISPERDAL) 2 MG tablet     . sucralfate (CARAFATE) 1 g tablet Take 1 g by mouth 2 (two) times daily.    [name]  tolterodine (DETROL LA) 4 MG 24 hr capsule TAKE 1 CAPSULE BY MOUTH ONCE EVERY MORNING 90 capsule 1  . dexamethasone (DECADRON) 4 MG tablet Take 2 tablets (8 mg total) by mouth daily. Start the day after chemotherapy for 1 day. (Patient not taking: Reported on 04/27/2019) 30 tablet 1  . methotrexate (RHEUMATREX) 2.5 MG tablet Take 4 tablets (10 mg total) by mouth once a week. Caution: Protect from light. (Patient not taking: Reported on 04/27/2019) 16 tablet 3  . ondansetron (ZOFRAN) 8 MG tablet Take 1 tablet (8 mg total) by mouth 2 (two) times daily as needed for refractory nausea / vomiting. Start on day 3 after carboplatin chemo. (Patient not taking: Reported on 06/01/2019) 30 tablet 1   No current facility-administered medications for this visit.     PHYSICAL EXAMINATION: ECOG PERFORMANCE STATUS: 1 - Symptomatic but completely ambulatory Vitals:   06/01/19 1358  BP: 121/83  Pulse: 86  Resp: 18  Temp: (!) 97.4 F (36.3 C)   Filed Weights   06/01/19 1358  Weight: 208 lb 1.6 oz (94.4 kg)    Physical Exam Constitutional:      General: She is not in acute distress.    Appearance: She is obese.  HENT:     Head: Normocephalic and atraumatic.  Eyes:     General: No scleral icterus. Cardiovascular:     Rate and Rhythm: Normal rate and regular rhythm.     Heart sounds: Normal heart sounds.  Pulmonary:     Effort: Pulmonary effort is normal. No respiratory distress.     Breath sounds: No wheezing.  Abdominal:     General: Bowel sounds are normal. There is no distension.     Palpations: Abdomen is soft.  Musculoskeletal:        General: No deformity. Normal range of motion.     Cervical back: Normal range of motion and neck supple.  Skin:    General: Skin is warm and dry.     Findings: No erythema or rash.  Neurological:     General: No focal deficit present.     Mental Status: She is alert. Mental status is at  baseline.     Cranial Nerves: No cranial nerve deficit.      Comments: Oriented in person Muscle strength, 4/5 all extremities.   Psychiatric:        Mood and Affect: Mood normal.     LABORATORY DATA:  I have reviewed the data as listed Lab Results  Component Value Date   WBC 2.4 (L) 06/01/2019   HGB 13.2 06/01/2019   HCT 39.6 06/01/2019   MCV 95.2 06/01/2019   PLT 85 (L) 06/01/2019   Recent Labs    08/06/18 1007 08/06/18 1007 12/14/18 0857 01/01/19 1728 05/18/19 0811 05/26/19 0920 06/01/19 1325  NA 141   < > 141   < > 138 140 139  K 3.8   < > 3.6   < > 3.6 4.2 3.7  CL 107   < > 105   < > 107 104 104  CO2 27   < > 30   < > 24 27 26   GLUCOSE 88   < > 120*   < > 116* 111* 98  BUN 15   < > 14   < > 14 16 20   CREATININE 0.94   < > 0.78   < > 0.98 1.03* 0.93  CALCIUM 8.7   < > 9.0   < > 8.6* 8.7* 8.8*  GFRNONAA  --   --   --    < > 60* 56* >60  GFRAA  --   --   --    < > >60 >60 >60  PROT 5.8*   < > 6.1   < > 6.4* 6.3* 6.6  ALBUMIN 3.8   < > 4.0   < > 3.6 3.6 3.9  AST 19   < > 20   < > 27 20 23   ALT 19   < > 16   < > 23 25 23   ALKPHOS 62   < > 59   < > 62 50 53  BILITOT 0.4   < > 0.6   < > 0.4 0.9 0.7  BILIDIR 0.1  --  0.1  --   --   --   --    < > = values in this interval not displayed.   Iron/TIBC/Ferritin/ %Sat No results found for: IRON, TIBC, FERRITIN, IRONPCTSAT    RADIOGRAPHIC STUDIES: I have personally reviewed the radiological images as listed and agreed with the findings in the report.. CT CHEST W CONTRAST  Result Date: 03/07/2019 CLINICAL DATA:  Staging, suspected renal cell carcinoma EXAM: CT CHEST WITH CONTRAST TECHNIQUE: Multidetector CT imaging of the chest was performed during intravenous contrast administration. CONTRAST:  65mL OMNIPAQUE IOHEXOL 300 MG/ML  SOLN COMPARISON:  None. FINDINGS: Cardiovascular: No significant vascular findings. Normal heart size. No pericardial effusion. Mediastinum/Nodes: No enlarged mediastinal, hilar, or axillary lymph nodes. There is a lobulated, internally calcified mass of the  anterior mediastinum which effaces the central portion of the left brachiocephalic vein and very closely abuts the tubular ascending aorta and measures approximately 5.8 x 4.9 x 4.1 cm (series 2, image 58, series 6, image 80). There appears to be adjacent pleural nodularity about the right upper lobe (series 2, image 39, series 5, image 55). Thyroid gland, trachea, and esophagus demonstrate no significant findings. Lungs/Pleura: There is a 3 mm pulmonary nodule of the right upper lobe (series 3, image 68). 3 mm pulmonary nodule of the anterior right middle lobe (series 3, image 91). No pleural effusion or pneumothorax. Upper Abdomen: No  acute abnormality. Hepatic steatosis. Probable bilateral adrenal adenomas, better characterized by prior MRI. Musculoskeletal: No chest wall mass or suspicious bone lesions identified. IMPRESSION: 1. There is a lobulated, internally calcified mass of the anterior mediastinum which effaces the central portion of the left brachiocephalic vein and very closely abuts the tubular ascending aorta, measuring approximately 5.8 x 4.9 x 4.1 cm (series 2, image 58, series 6, image 80). 2. There appears to be adjacent pleural nodularity about the right upper lobe (series 2, image 39, series 5, image 55). 3. Findings are characteristic in location and appearance for thymoma, and apparent involvement of the left brachiocephalic vein and pleura are concerning for invasive thymic carcinoma. Although patient has a presumed renal cell carcinoma better evaluated by prior MRI, metastatic disease is much less favored given this appearance. 4. There is a 3 mm pulmonary nodule of the right upper lobe (series 3, image 68). 3 mm pulmonary nodule of the anterior right middle lobe (series 3, image 91). These nodules are nonspecific and metastatic disease is not excluded. Attention on follow-up. 5. Hepatic steatosis. 6. Probable bilateral adrenal adenomas, better characterized by prior MRI. Electronically  Signed   By: Eddie Candle M.D.   On: 03/07/2019 08:34   CT BIOPSY  Result Date: 03/28/2019 CLINICAL DATA:  Monoclonal gammopathy of unknown significance. Left lower pole renal mass. EXAM: CT GUIDED NEEDLE ASPIRATE AND CORE BIOPSY OF RIGHT ILIAC BONE MARROW CT-GUIDED CORE BIOPSY, LEFT RENAL MASS ANESTHESIA/SEDATION: Intravenous Fentanyl 53mcg and Versed 1mg  were administered as conscious sedation during continuous monitoring of the patient's level of consciousness and physiological / cardiorespiratory status by the radiology RN, with a total moderate sedation time of 50 minutes. PROCEDURE: The procedure risks, benefits, and alternatives were explained to the patient. Questions regarding the procedure were encouraged and answered. The patient understands and consents to the procedure. Select axial scans through the upper thorax were obtained. The anterior mediastinal mass was localized and an appropriate skin entry site was determined and marked. The operative field was prepped with chlorhexidinein a sterile fashion, and a sterile drape was applied covering the operative field. A sterile gown and sterile gloves were used for the procedure. Local anesthesia was provided with 1% Lidocaine. Under CT fluoroscopic guidance, a 17 gauge trocar needle was advanced to the margin of the lesion. Once needle tip position was confirmed, coaxial 18-gauge core biopsy samples were obtained, submitted in formalin to surgical pathology. The guide needle was removed. Postprocedure scans show no significant hemorrhage or other apparent complication. No pneumothorax. The patient was then reposition prone. Select axial scans through the retroperitoneum obtained in the left renal mass was localized. An appropriate skin entry site was determined. The operative field was prepped with chlorhexidinein a sterile fashion, and a sterile drape was applied covering the operative field. A sterile gown and sterile gloves were used for the  procedure. Local anesthesia was provided with 1% Lidocaine. Under CT fluoroscopic guidance, a 17 gauge trocar needle was advanced to the margin of the lesion. Once needle tip position was confirmed, coaxial 18-gauge core biopsy samples were obtained, submitted in formalin to surgical pathology. The guide needle was removed. Postprocedure scans show minimal regional retroperitoneal hemorrhage. Patient remained hemodynamically stable. COMPLICATIONS: None immediate FINDINGS: Lobular partially calcified anterior mediastinal mass was localized. Representative core biopsy samples obtained. Slightly hyperdense lower pole left renal mass was localized. Representative core biopsy samples were obtained. IMPRESSION: 1. Technically successful CT-guided core biopsy, anterior mediastinal mass. 2. Technically successful CT-guided core biopsy,  left renal mass. Electronically Signed   By: Lucrezia Europe M.D.   On: 03/28/2019 14:26   CT BIOPSY  Result Date: 03/28/2019 CLINICAL DATA:  Monoclonal gammopathy of unknown significance. Left lower pole renal mass. EXAM: CT GUIDED NEEDLE ASPIRATE AND CORE BIOPSY OF RIGHT ILIAC BONE MARROW CT-GUIDED CORE BIOPSY, LEFT RENAL MASS ANESTHESIA/SEDATION: Intravenous Fentanyl 37mcg and Versed 1mg  were administered as conscious sedation during continuous monitoring of the patient's level of consciousness and physiological / cardiorespiratory status by the radiology RN, with a total moderate sedation time of 50 minutes. PROCEDURE: The procedure risks, benefits, and alternatives were explained to the patient. Questions regarding the procedure were encouraged and answered. The patient understands and consents to the procedure. Select axial scans through the upper thorax were obtained. The anterior mediastinal mass was localized and an appropriate skin entry site was determined and marked. The operative field was prepped with chlorhexidinein a sterile fashion, and a sterile drape was applied covering the  operative field. A sterile gown and sterile gloves were used for the procedure. Local anesthesia was provided with 1% Lidocaine. Under CT fluoroscopic guidance, a 17 gauge trocar needle was advanced to the margin of the lesion. Once needle tip position was confirmed, coaxial 18-gauge core biopsy samples were obtained, submitted in formalin to surgical pathology. The guide needle was removed. Postprocedure scans show no significant hemorrhage or other apparent complication. No pneumothorax. The patient was then reposition prone. Select axial scans through the retroperitoneum obtained in the left renal mass was localized. An appropriate skin entry site was determined. The operative field was prepped with chlorhexidinein a sterile fashion, and a sterile drape was applied covering the operative field. A sterile gown and sterile gloves were used for the procedure. Local anesthesia was provided with 1% Lidocaine. Under CT fluoroscopic guidance, a 17 gauge trocar needle was advanced to the margin of the lesion. Once needle tip position was confirmed, coaxial 18-gauge core biopsy samples were obtained, submitted in formalin to surgical pathology. The guide needle was removed. Postprocedure scans show minimal regional retroperitoneal hemorrhage. Patient remained hemodynamically stable. COMPLICATIONS: None immediate FINDINGS: Lobular partially calcified anterior mediastinal mass was localized. Representative core biopsy samples obtained. Slightly hyperdense lower pole left renal mass was localized. Representative core biopsy samples were obtained. IMPRESSION: 1. Technically successful CT-guided core biopsy, anterior mediastinal mass. 2. Technically successful CT-guided core biopsy, left renal mass. Electronically Signed   By: Lucrezia Europe M.D.   On: 03/28/2019 14:26       ASSESSMENT & PLAN:  1. Thymoma   2. Acute cystitis with hematuria   3. Elevated serum creatinine   4. Thrombocytopenia ([facility])    #Invasive  thymoma, S/p 2 cycle of  reduced carboplatin AUC 4, etoposide day 1 to 3 at 75 mg/m, every 3 weeks. Currently on concurrent radiation.   #UTI, on antibiotics.  Clinically symptoms are getting better Recommend patient to finish course of antibiotics.   #slightly elevated creatinine, encourage oral hydration.  Creatinine has improved. #Thrombocytopenia, platelet count 85,000.  Likely secondary to radiation and chemotherapy.  No bleeding events. Monitor.  CBC in 1 week.   #Follow-up appointment in 1 week  All questions were answered. The patient knows to call the clinic with any problems questions or concerns.   Earlie Server, MD, PhD Hematology Oncology [facility] Pager- [phone]

## 2019-06-03 ENCOUNTER — Ambulatory Visit
Admission: RE | Admit: 2019-06-03 | Discharge: 2019-06-03 | Disposition: A | Discharge status: Home / Self Care | Payer: PPO | Source: Ambulatory Visit | Attending: Radiation Oncology | Admitting: Radiation Oncology

## 2019-06-03 DIAGNOSIS — Z510 Encounter for antineoplastic radiation therapy: Secondary | ICD-10-CM | POA: Diagnosis not present

## 2019-06-06 ENCOUNTER — Ambulatory Visit
Admission: RE | Admit: 2019-06-06 | Discharge: 2019-06-06 | Disposition: A | Discharge status: Home / Self Care | Payer: PPO | Source: Ambulatory Visit | Attending: Radiation Oncology | Admitting: Radiation Oncology

## 2019-06-06 DIAGNOSIS — Z510 Encounter for antineoplastic radiation therapy: Secondary | ICD-10-CM | POA: Diagnosis not present

## 2019-06-07 ENCOUNTER — Ambulatory Visit
Admission: RE | Admit: 2019-06-07 | Discharge: 2019-06-07 | Disposition: A | Discharge status: Home / Self Care | Payer: PPO | Source: Ambulatory Visit | Attending: Radiation Oncology | Admitting: Radiation Oncology

## 2019-06-07 DIAGNOSIS — Z510 Encounter for antineoplastic radiation therapy: Secondary | ICD-10-CM | POA: Diagnosis not present

## 2019-06-08 ENCOUNTER — Other Ambulatory Visit: Payer: Self-pay

## 2019-06-08 ENCOUNTER — Encounter: Payer: Self-pay | Admitting: Oncology

## 2019-06-08 ENCOUNTER — Inpatient Hospital Stay (HOSPITAL_BASED_OUTPATIENT_CLINIC_OR_DEPARTMENT_OTHER): Payer: PPO | Admitting: Oncology

## 2019-06-08 ENCOUNTER — Ambulatory Visit
Admission: RE | Admit: 2019-06-08 | Discharge: 2019-06-08 | Disposition: A | Discharge status: Home / Self Care | Payer: PPO | Source: Ambulatory Visit | Attending: Radiation Oncology | Admitting: Radiation Oncology

## 2019-06-08 ENCOUNTER — Inpatient Hospital Stay: Payer: PPO

## 2019-06-08 ENCOUNTER — Ambulatory Visit: Payer: PPO

## 2019-06-08 VITALS — BP 115/92 | HR 105 | Temp 97.4°F | Resp 18 | Wt 215.0 lb

## 2019-06-08 DIAGNOSIS — C642 Malignant neoplasm of left kidney, except renal pelvis: Secondary | ICD-10-CM

## 2019-06-08 DIAGNOSIS — D4989 Neoplasm of unspecified behavior of other specified sites: Secondary | ICD-10-CM | POA: Diagnosis not present

## 2019-06-08 DIAGNOSIS — Z510 Encounter for antineoplastic radiation therapy: Secondary | ICD-10-CM | POA: Diagnosis not present

## 2019-06-08 DIAGNOSIS — Z5112 Encounter for antineoplastic immunotherapy: Secondary | ICD-10-CM | POA: Diagnosis not present

## 2019-06-08 DIAGNOSIS — D696 Thrombocytopenia, unspecified: Secondary | ICD-10-CM | POA: Diagnosis not present

## 2019-06-08 LAB — CBC WITH DIFFERENTIAL/PLATELET
Abs Immature Granulocytes: 0.14 10*3/uL — ABNORMAL HIGH (ref 0.00–0.07)
Basophils Absolute: 0 10*3/uL (ref 0.0–0.1)
Basophils Relative: 1 %
Eosinophils Absolute: 0 10*3/uL (ref 0.0–0.5)
Eosinophils Relative: 0 %
HCT: 41.9 % (ref 36.0–46.0)
Hemoglobin: 13.9 g/dL (ref 12.0–15.0)
Immature Granulocytes: 5 %
Lymphocytes Relative: 25 %
Lymphs Abs: 0.8 10*3/uL (ref 0.7–4.0)
MCH: 32.2 pg (ref 26.0–34.0)
MCHC: 33.2 g/dL (ref 30.0–36.0)
MCV: 97 fL (ref 80.0–100.0)
Monocytes Absolute: 0.5 10*3/uL (ref 0.1–1.0)
Monocytes Relative: 16 %
Neutro Abs: 1.6 10*3/uL — ABNORMAL LOW (ref 1.7–7.7)
Neutrophils Relative %: 53 %
Platelets: 139 10*3/uL — ABNORMAL LOW (ref 150–400)
RBC: 4.32 MIL/uL (ref 3.87–5.11)
RDW: 16.2 % — ABNORMAL HIGH (ref 11.5–15.5)
WBC: 3.1 10*3/uL — ABNORMAL LOW (ref 4.0–10.5)
nRBC: 0 % (ref 0.0–0.2)

## 2019-06-08 LAB — COMPREHENSIVE METABOLIC PANEL
ALT: 20 U/L (ref 0–44)
AST: 25 U/L (ref 15–41)
Albumin: 3.7 g/dL (ref 3.5–5.0)
Alkaline Phosphatase: 78 U/L (ref 38–126)
Anion gap: 10 (ref 5–15)
BUN: 14 mg/dL (ref 8–23)
CO2: 25 mmol/L (ref 22–32)
Calcium: 8.2 mg/dL — ABNORMAL LOW (ref 8.9–10.3)
Chloride: 107 mmol/L (ref 98–111)
Creatinine, Ser: 0.87 mg/dL (ref 0.44–1.00)
GFR calc Af Amer: >60 mL/min (ref >60)
GFR calc non Af Amer: >60 mL/min (ref >60)
Glucose, Bld: 92 mg/dL (ref 70–99)
Potassium: 3.5 mmol/L (ref 3.5–5.1)
Sodium: 142 mmol/L (ref 135–145)
Total Bilirubin: 0.3 mg/dL (ref 0.3–1.2)
Total Protein: 6.4 g/dL — ABNORMAL LOW (ref 6.5–8.1)

## 2019-06-08 NOTE — Progress Notes (Signed)
Patient here for follow up. No concerns voiced.  °

## 2019-06-09 ENCOUNTER — Other Ambulatory Visit: Payer: Self-pay | Admitting: Internal Medicine

## 2019-06-09 ENCOUNTER — Ambulatory Visit
Admission: RE | Admit: 2019-06-09 | Discharge: 2019-06-09 | Disposition: A | Discharge status: Home / Self Care | Payer: PPO | Source: Ambulatory Visit | Attending: Radiation Oncology | Admitting: Radiation Oncology

## 2019-06-09 DIAGNOSIS — C37 Malignant neoplasm of thymus: Secondary | ICD-10-CM | POA: Diagnosis not present

## 2019-06-09 DIAGNOSIS — Z510 Encounter for antineoplastic radiation therapy: Secondary | ICD-10-CM | POA: Diagnosis not present

## 2019-06-09 NOTE — Progress Notes (Signed)
Hematology/Oncology follow up  note [facility] [phone] [phone]   Patient Care Team: Einar Pheasant, MD as PCP - General (Internal Medicine) Telford Nab, RN as Oncology Nurse Navigator  REFERRING PROVIDER: Einar Pheasant, MD  CHIEF COMPLAINTS/REASON FOR VISIT:  Follow-up for thymoma  HISTORY OF PRESENTING ILLNESS:   Madison Burns is a  69 y.o.  female with PMH listed below was seen in consultation at the request of  Einar Pheasant, MD  for evaluation of thymoma.  Patient was accompanied by her sister today.  History was obtained from her sister.  Patient has intellectual disability Patient initially underwent a CT abdomen pelvis with contrast on 02/07/2019 for worsening umbilical pain.  It reviewed incidental 3 cm renal mass on the left.  Subsequent MRCP 02/21/2019 performed for the purpose of right upper quadrant pain as well as renal mass in question.  MRI 02/20/2018 showed 3.2 x 2.6 left lower pole endophytic renal mass, consistent with possible renal cell carcinoma.  No signs of metastatic disease in abdomen.  Adrenal lesion more suggestive adenomas.  There was an incidental finding of lobular area along the anterior mediastinum, questionably e mediastinal mass.  CT scan 02/07/2019 showed lobulated internally calcified mass of the anterior mediastinum which effaces the central portion of the left brachiocephalic vein and a very closely abuts the tubular ascending aorta, 5.8 x 4.9 x 4.1 cm.  Adjacent pleural nodularity about the right upper lobe. 3 mm pulmonary nodule of the right upper lobe, hepatic steatosis.  #Patient has a chronic history of myasthenia gravis, initially diagnosed in 2011.  Patient had been treated with prednisone for prolonged period of time.  Recently she has been off prednisone and is currently taking methotrexate and pyridostigmine for symptoms.  Per sister, patient does not complain much of weakness.  At her  baseline.  She is able to feed and dress herself.  She needs some assistance from her sister for bathing.  She likes to watch TV.  Patient has been seen by Dr. Genevive Bi and Dr. Erlene Quan.  Patient's sister would like to entertain the diagnosis and potential therapy. Patient underwent CT-guided needle biopsy of the anterior mediastinal mass, as well as biopsy of the kidney mass Anterior mediastinal mass biopsy pathology showed thymoma, predominantly B2 patent with subsets of a B3 pattern. Left kidney mass showed renal cell carcinoma,tumor cannot be reliably subtyped based on this limited sample and the IHC profile  She has a history of melanoma on her neck in 2017.   So because of the initial appearance of the CT scan we did end up doing a CT chest is Sister is POA. Patient lives with sister  INTERVAL HISTORY Madison Burns is a 69 y.o. female who has above history reviewed by me today presents for follow up visit for management of malignant thymoma Problems and complaints are listed below: S/p 2 cycles of carboplatin and etoposide On concurrent Radiation.  Today patient reports doing well. No new complaints.  Finished antibiotics for UTI.  No fever or chills.  Symptoms have all resolved.   Review of Systems  Unable to perform ROS: Other (Intellectual Disability)  Gastrointestinal: Negative for diarrhea and nausea.  Genitourinary: Negative for dysuria and frequency.     MEDICAL HISTORY:  Past Medical History:  Diagnosis Date  . Cancer of kidney ([place]) 2021  . GERD (gastroesophageal reflux disease)   . History of seizure disorder   . Hypercholesterolemia   . Hypertension   . Irritable  bowel syndrome   . Myasthenia gravis ([place]) 2011  . OSA on CPAP   . Skin cancer 2017   Melanoma on neck    SURGICAL HISTORY: Past Surgical History:  Procedure Laterality Date  . ABDOMINAL HYSTERECTOMY  1990  . APPENDECTOMY  1999  . CHOLECYSTECTOMY  2013  . COLONOSCOPY WITH PROPOFOL N/A 06/30/2017    Procedure: COLONOSCOPY WITH PROPOFOL;  Surgeon: Toledo, Benay Pike, MD;  Location: [facility] ENDOSCOPY;  Service: Gastroenterology;  Laterality: N/A;  . ESOPHAGOGASTRODUODENOSCOPY (EGD) WITH PROPOFOL N/A 06/30/2017   Procedure: ESOPHAGOGASTRODUODENOSCOPY (EGD) WITH PROPOFOL;  Surgeon: Toledo, Benay Pike, MD;  Location: [facility] ENDOSCOPY;  Service: Gastroenterology;  Laterality: N/A;  . TONSILLECTOMY  1959    SOCIAL HISTORY: Social History   Socioeconomic History  . Marital status: Single    Spouse name: Not on file  . Number of children: 0  . Years of education: Special Ed  . Highest education level: Not on file  Occupational History  . Occupation: Unemployed  Tobacco Use  . Smoking status: Never Smoker  . Smokeless tobacco: Never Used  Substance and Sexual Activity  . Alcohol use: No    Alcohol/week: 0.0 standard drinks  . Drug use: No  . Sexual activity: Not Currently  Other Topics Concern  . Not on file  Social History Narrative   06/23/17 lives with sister, [name]   Regular exercise-no   Caffeine Use-yes   Social Determinants of Health   Financial Resource Strain:   . Difficulty of Paying Living Expenses:   Food Insecurity:   . Worried About Charity fundraiser in the Last Year:   . Arboriculturist in the Last Year:   Transportation Needs:   . Film/video editor (Medical):   [name] Lack of Transportation (Non-Medical):   Physical Activity:   . Days of Exercise per Week:   . Minutes of Exercise per Session:   Stress:   . Feeling of Stress :   Social Connections:   . Frequency of Communication with Friends and Family:   . Frequency of Social Gatherings with Friends and Family:   . Attends Religious Services:   . Active Member of Clubs or Organizations:   . Attends Archivist Meetings:   [name] Marital Status:   Intimate Partner Violence:   . Fear of Current or Ex-Partner:   . Emotionally Abused:   [name] Physically Abused:   . Sexually Abused:     FAMILY  HISTORY: Family History  Problem Relation Age of Onset  . Colon cancer Maternal Grandfather        stomach/colon  . Heart disease Father        myocardial infarction  . Hyperlipidemia Father   . Hyperlipidemia Sister   . Diabetes Sister   . Bone cancer Other        cousin  . Alzheimer's disease Mother        maternal aunts  . Skin cancer Mother   . Myasthenia gravis Brother        ocular  . Skin cancer Brother   . Stroke Paternal Grandfather   . Breast cancer Neg Hx     ALLERGIES:  is allergic to ciprofloxacin and levofloxacin.  MEDICATIONS:  Current Outpatient Medications  Medication Sig Dispense Refill  . Ascorbic Acid (VITAMIN C) 1000 MG tablet Take 1,000 mg by mouth daily.    [name] b complex vitamins tablet Take 1 tablet by mouth daily.    . benzonatate (TESSALON) 100 MG  capsule TAKE 1 CAPSULE BY MOUTH TWICE DAILY AS NEEDED COUGH 30 capsule 0  . Biotin 10000 MCG TABS Take 1 capsule by mouth daily.    . Calcium-Phosphorus-Vitamin D (CITRACAL +D3 PO) Take 25-1,000 mcg by mouth daily.    . folic acid (FOLVITE) 1 MG tablet Take 2 tablets (2 mg total) by mouth daily. 180 tablet 3  . levothyroxine (SYNTHROID) 75 MCG tablet TAKE 1 TABLET BY MOUTH DAILY ON AN EMPTYSTOMACH. WAIT 30 MINUTES BEFORE TAKING OTHER MEDS 90 tablet 1  . OVER THE COUNTER MEDICATION Take 1 tablet by mouth daily. BRAIN ENERGIZER    . pantoprazole (PROTONIX) 40 MG tablet 40 mg daily.    . Probiotic Product (ALIGN PO) Take 1 capsule by mouth daily.    . prochlorperazine (COMPAZINE) 10 MG tablet Take 1 tablet (10 mg total) by mouth every 6 (six) hours as needed (Nausea or vomiting). 30 tablet 1  . pyridostigmine (MESTINON) 60 MG tablet TAKE 1 TABLET BY MOUTH AT  8 AM, ONE AT LUNCH AND ONE AT 8PM 90 tablet 6  . risperiDONE (RISPERDAL) 2 MG tablet     . sucralfate (CARAFATE) 1 g tablet Take 1 g by mouth 2 (two) times daily.    [name] tolterodine (DETROL LA) 4 MG 24 hr capsule TAKE 1 CAPSULE BY MOUTH ONCE EVERY MORNING 90  capsule 1  . dexamethasone (DECADRON) 4 MG tablet Take 2 tablets (8 mg total) by mouth daily. Start the day after chemotherapy for 1 day. (Patient not taking: Reported on 04/27/2019) 30 tablet 1  . methotrexate (RHEUMATREX) 2.5 MG tablet Take 4 tablets (10 mg total) by mouth once a week. Caution: Protect from light. (Patient not taking: Reported on 04/27/2019) 16 tablet 3  . ondansetron (ZOFRAN) 8 MG tablet Take 1 tablet (8 mg total) by mouth 2 (two) times daily as needed for refractory nausea / vomiting. Start on day 3 after carboplatin chemo. (Patient not taking: Reported on 06/01/2019) 30 tablet 1   No current facility-administered medications for this visit.     PHYSICAL EXAMINATION: ECOG PERFORMANCE STATUS: 1 - Symptomatic but completely ambulatory Vitals:   06/08/19 1058  BP: (!) 115/92  Pulse: (!) 105  Resp: 18  Temp: (!) 97.4 F (36.3 C)   Filed Weights   06/08/19 1058  Weight: 215 lb (97.5 kg)    Physical Exam Constitutional:      General: She is not in acute distress.    Appearance: She is obese.  HENT:     Head: Normocephalic and atraumatic.  Eyes:     General: No scleral icterus. Cardiovascular:     Rate and Rhythm: Normal rate and regular rhythm.     Heart sounds: Normal heart sounds.  Pulmonary:     Effort: Pulmonary effort is normal. No respiratory distress.     Breath sounds: No wheezing.  Abdominal:     General: Bowel sounds are normal. There is no distension.     Palpations: Abdomen is soft.  Musculoskeletal:        General: No deformity. Normal range of motion.     Cervical back: Normal range of motion and neck supple.  Skin:    General: Skin is warm and dry.     Findings: No erythema or rash.  Neurological:     General: No focal deficit present.     Mental Status: She is alert and oriented to person, place, and time. Mental status is at baseline.     Cranial Nerves:  No cranial nerve deficit.     Coordination: Coordination normal.     Comments:  Oriented in person Muscle strength, 4/5 all extremities.   Psychiatric:        Mood and Affect: Mood normal.     LABORATORY DATA:  I have reviewed the data as listed Lab Results  Component Value Date   WBC 3.1 (L) 06/08/2019   HGB 13.9 06/08/2019   HCT 41.9 06/08/2019   MCV 97.0 06/08/2019   PLT 139 (L) 06/08/2019   Recent Labs    08/06/18 1007 08/06/18 1007 12/14/18 0857 01/01/19 1728 05/26/19 0920 06/01/19 1325 06/08/19 1017  NA 141   < > 141   < > 140 139 142  K 3.8   < > 3.6   < > 4.2 3.7 3.5  CL 107   < > 105   < > 104 104 107  CO2 27   < > 30   < > 27 26 25   GLUCOSE 88   < > 120*   < > 111* 98 92  BUN 15   < > 14   < > 16 20 14   CREATININE 0.94   < > 0.78   < > 1.03* 0.93 0.87  CALCIUM 8.7   < > 9.0   < > 8.7* 8.8* 8.2*  GFRNONAA  --   --   --    < > 56* >60 >60  GFRAA  --   --   --    < > >60 >60 >60  PROT 5.8*   < > 6.1   < > 6.3* 6.6 6.4*  ALBUMIN 3.8   < > 4.0   < > 3.6 3.9 3.7  AST 19   < > 20   < > 20 23 25   ALT 19   < > 16   < > 25 23 20   ALKPHOS 62   < > 59   < > 50 53 78  BILITOT 0.4   < > 0.6   < > 0.9 0.7 0.3  BILIDIR 0.1  --  0.1  --   --   --   --    < > = values in this interval not displayed.   Iron/TIBC/Ferritin/ %Sat No results found for: IRON, TIBC, FERRITIN, IRONPCTSAT    RADIOGRAPHIC STUDIES: I have personally reviewed the radiological images as listed and agreed with the findings in the report.. CT BIOPSY  Result Date: 03/28/2019 CLINICAL DATA:  Monoclonal gammopathy of unknown significance. Left lower pole renal mass. EXAM: CT GUIDED NEEDLE ASPIRATE AND CORE BIOPSY OF RIGHT ILIAC BONE MARROW CT-GUIDED CORE BIOPSY, LEFT RENAL MASS ANESTHESIA/SEDATION: Intravenous Fentanyl 41mcg and Versed 1mg  were administered as conscious sedation during continuous monitoring of the patient's level of consciousness and physiological / cardiorespiratory status by the radiology RN, with a total moderate sedation time of 50 minutes. PROCEDURE: The procedure  risks, benefits, and alternatives were explained to the patient. Questions regarding the procedure were encouraged and answered. The patient understands and consents to the procedure. Select axial scans through the upper thorax were obtained. The anterior mediastinal mass was localized and an appropriate skin entry site was determined and marked. The operative field was prepped with chlorhexidinein a sterile fashion, and a sterile drape was applied covering the operative field. A sterile gown and sterile gloves were used for the procedure. Local anesthesia was provided with 1% Lidocaine. Under CT fluoroscopic guidance, a 17 gauge trocar needle was advanced to the  margin of the lesion. Once needle tip position was confirmed, coaxial 18-gauge core biopsy samples were obtained, submitted in formalin to surgical pathology. The guide needle was removed. Postprocedure scans show no significant hemorrhage or other apparent complication. No pneumothorax. The patient was then reposition prone. Select axial scans through the retroperitoneum obtained in the left renal mass was localized. An appropriate skin entry site was determined. The operative field was prepped with chlorhexidinein a sterile fashion, and a sterile drape was applied covering the operative field. A sterile gown and sterile gloves were used for the procedure. Local anesthesia was provided with 1% Lidocaine. Under CT fluoroscopic guidance, a 17 gauge trocar needle was advanced to the margin of the lesion. Once needle tip position was confirmed, coaxial 18-gauge core biopsy samples were obtained, submitted in formalin to surgical pathology. The guide needle was removed. Postprocedure scans show minimal regional retroperitoneal hemorrhage. Patient remained hemodynamically stable. COMPLICATIONS: None immediate FINDINGS: Lobular partially calcified anterior mediastinal mass was localized. Representative core biopsy samples obtained. Slightly hyperdense lower pole  left renal mass was localized. Representative core biopsy samples were obtained. IMPRESSION: 1. Technically successful CT-guided core biopsy, anterior mediastinal mass. 2. Technically successful CT-guided core biopsy, left renal mass. Electronically Signed   By: Lucrezia Europe M.D.   On: 03/28/2019 14:26   CT BIOPSY  Result Date: 03/28/2019 CLINICAL DATA:  Monoclonal gammopathy of unknown significance. Left lower pole renal mass. EXAM: CT GUIDED NEEDLE ASPIRATE AND CORE BIOPSY OF RIGHT ILIAC BONE MARROW CT-GUIDED CORE BIOPSY, LEFT RENAL MASS ANESTHESIA/SEDATION: Intravenous Fentanyl 11mcg and Versed 1mg  were administered as conscious sedation during continuous monitoring of the patient's level of consciousness and physiological / cardiorespiratory status by the radiology RN, with a total moderate sedation time of 50 minutes. PROCEDURE: The procedure risks, benefits, and alternatives were explained to the patient. Questions regarding the procedure were encouraged and answered. The patient understands and consents to the procedure. Select axial scans through the upper thorax were obtained. The anterior mediastinal mass was localized and an appropriate skin entry site was determined and marked. The operative field was prepped with chlorhexidinein a sterile fashion, and a sterile drape was applied covering the operative field. A sterile gown and sterile gloves were used for the procedure. Local anesthesia was provided with 1% Lidocaine. Under CT fluoroscopic guidance, a 17 gauge trocar needle was advanced to the margin of the lesion. Once needle tip position was confirmed, coaxial 18-gauge core biopsy samples were obtained, submitted in formalin to surgical pathology. The guide needle was removed. Postprocedure scans show no significant hemorrhage or other apparent complication. No pneumothorax. The patient was then reposition prone. Select axial scans through the retroperitoneum obtained in the left renal mass was  localized. An appropriate skin entry site was determined. The operative field was prepped with chlorhexidinein a sterile fashion, and a sterile drape was applied covering the operative field. A sterile gown and sterile gloves were used for the procedure. Local anesthesia was provided with 1% Lidocaine. Under CT fluoroscopic guidance, a 17 gauge trocar needle was advanced to the margin of the lesion. Once needle tip position was confirmed, coaxial 18-gauge core biopsy samples were obtained, submitted in formalin to surgical pathology. The guide needle was removed. Postprocedure scans show minimal regional retroperitoneal hemorrhage. Patient remained hemodynamically stable. COMPLICATIONS: None immediate FINDINGS: Lobular partially calcified anterior mediastinal mass was localized. Representative core biopsy samples obtained. Slightly hyperdense lower pole left renal mass was localized. Representative core biopsy samples were obtained. IMPRESSION: 1. Technically successful  CT-guided core biopsy, anterior mediastinal mass. 2. Technically successful CT-guided core biopsy, left renal mass. Electronically Signed   By: Lucrezia Europe M.D.   On: 03/28/2019 14:26       ASSESSMENT & PLAN:  1. Thymoma   2. Renal cell cancer, left ([place])   3. Thrombocytopenia ([facility])    #Invasive thymoma, S/p 2 cycle of  reduced carboplatin AUC 4, etoposide day 1 to 3 at 75 mg/m, every 3 weeks. Currently on concurrent radiation.  She is about to finish radiation. Labs reviewed and discussed with patient. Counts are stable. UTI has resolved Thrombocytopenia, platelet count has recovered to 1 39,000. Small kidney lesion, possible RCC.  Patient follow-up with urology for management.  Advised patient to follow-up in 3 months. All questions were answered. The patient knows to call the clinic with any problems questions or concerns.   Earlie Server, MD, PhD Hematology Oncology [facility] Pager-  [phone]

## 2019-06-10 ENCOUNTER — Telehealth: Payer: Self-pay | Admitting: *Deleted

## 2019-06-10 NOTE — Telephone Encounter (Signed)
Attempted to leave VM-mailbox full. Unable to leave message-patient needs CT completed prior to appointment with Dr. Erlene Quan.

## 2019-06-11 NOTE — Telephone Encounter (Signed)
Received request for refill tessalon perles.  This is usually not an automatic refill.  Please confirm if pt needs.

## 2019-06-14 ENCOUNTER — Ambulatory Visit: Payer: PPO | Admitting: Urology

## 2019-06-15 ENCOUNTER — Ambulatory Visit
Admission: RE | Admit: 2019-06-15 | Discharge: 2019-06-15 | Disposition: A | Discharge status: Home / Self Care | Payer: PPO | Source: Ambulatory Visit | Attending: Urology | Admitting: Urology

## 2019-06-15 ENCOUNTER — Other Ambulatory Visit: Payer: Self-pay

## 2019-06-15 DIAGNOSIS — N2889 Other specified disorders of kidney and ureter: Secondary | ICD-10-CM | POA: Diagnosis not present

## 2019-06-15 MED ORDER — IOHEXOL 300 MG/ML  SOLN
100.0000 mL | Freq: Once | INTRAMUSCULAR | Status: AC | PRN
  Administered 2019-06-15: 11:00:00 100 mL via INTRAVENOUS

## 2019-06-21 NOTE — Progress Notes (Signed)
06/22/19 12:51 PM   Madison Burns 1950-02-27 [id number]  Referring provider: Einar Burns, [id number] [place],  [place] [address] Chief Complaint  Patient presents with  . renal cell cancer    HPI: Madison Burns is a 69 y.o. F with a personal history of developmental delay, myasthenia gravis, malignant invasive thymoma who returns today for the evaluation and management of left renal cell carcinoma. She is accompanied by her sister who will be providing the pt's hx.   Initially underwent a CT abdomen pelvis with contrast on 03/09/2018 for worsening umbilical pain. This revealed an incidental possible 3 cm renal mass on the left, however there was significant artifact appreciated secondary to motion  This is followed up with an MRCP performed for the purpose of right upper quadrant pain as well as the renal mass in question. MRI performed on 02/20/2018 showed a 3.2 x 2.6 left lower pole endophytic renal mass which was felt to possibly be enhancing and consistent with possible renal cell carcinoma.  She underwent a CT biopsy on 03/28/2019.Biopsy consistent with renal cell carcinoma.   She has received radiation treatment on 06/08/19 and has undergone 2 cycles of reduced carboplatin and Taxol, now complete. She is tolerating the treatment well.  Most recent CT Abdomen w wo contrast from 06/15/19 revealed stable appearance of solid enhancing lesion involving the inferior pole of left kidney compatible with renal cell carcinoma.No evidence for nodal metastasis or solid organ metastasis.   No personal or family history of renal cell carcinoma.  PMH: Past Medical History:  Diagnosis Date  . Cancer of kidney ([place]) 2021  . GERD (gastroesophageal reflux disease)   . History of seizure disorder   . Hypercholesterolemia   . Hypertension   . Irritable bowel syndrome   . Myasthenia gravis ([place]) 2011  . OSA on CPAP   . Skin cancer 2017   Melanoma on neck     Surgical History: Past Surgical History:  Procedure Laterality Date  . ABDOMINAL HYSTERECTOMY  1990  . APPENDECTOMY  1999  . CHOLECYSTECTOMY  2013  . COLONOSCOPY WITH PROPOFOL N/A 06/30/2017   Procedure: COLONOSCOPY WITH PROPOFOL;  Surgeon: Toledo, Benay Pike, MD;  Location: [facility];  Service: Gastroenterology;  Laterality: N/A;  . ESOPHAGOGASTRODUODENOSCOPY (EGD) WITH PROPOFOL N/A 06/30/2017   Procedure: ESOPHAGOGASTRODUODENOSCOPY (EGD) WITH PROPOFOL;  Surgeon: Toledo, Benay Pike, MD;  Location: [facility];  Service: Gastroenterology;  Laterality: N/A;  . TONSILLECTOMY  1959    Home Medications:  Allergies as of 06/22/2019      Reactions   Ciprofloxacin Other (See Comments)   Sister denies   Levofloxacin Other (See Comments)   Sister denies      Medication List       Accurate as of Jun 22, 2019 12:51 PM. If you have any questions, ask your nurse or doctor.        STOP taking these medications   dexamethasone 4 MG tablet Commonly known as: DECADRON Stopped by: Hollice Espy, MD   methotrexate 2.5 MG tablet Commonly known as: RHEUMATREX Stopped by: Hollice Espy, MD   ondansetron 8 MG tablet Commonly known as: Zofran Stopped by: Hollice Espy, MD   prochlorperazine 10 MG tablet Commonly known as: COMPAZINE Stopped by: Hollice Espy, MD     TAKE these medications   ALIGN PO Take 1 capsule by mouth daily.   b complex vitamins tablet Take 1 tablet by mouth daily.   benzonatate 100 MG capsule Commonly known  as: TESSALON TAKE 1 CAPSULE BY MOUTH TWICE DAILY AS NEEDED COUGH   Biotin 10000 MCG Tabs Take 1 capsule by mouth daily.   CITRACAL +D3 PO Take 25-1,000 mcg by mouth daily.   folic acid 1 MG tablet Commonly known as: FOLVITE Take 2 tablets (2 mg total) by mouth daily.   levothyroxine 75 MCG tablet Commonly known as: SYNTHROID TAKE 1 TABLET BY MOUTH DAILY ON AN EMPTYSTOMACH. WAIT 30 MINUTES BEFORE TAKING OTHER MEDS   OVER THE COUNTER  MEDICATION Take 1 tablet by mouth daily. BRAIN ENERGIZER   pantoprazole 40 MG tablet Commonly known as: PROTONIX 40 mg daily.   pyridostigmine 60 MG tablet Commonly known as: MESTINON TAKE 1 TABLET BY MOUTH AT  8 AM, ONE AT LUNCH AND ONE AT 8PM   risperiDONE 2 MG tablet Commonly known as: RISPERDAL   sucralfate 1 g tablet Commonly known as: CARAFATE Take 1 g by mouth 2 (two) times daily.   tolterodine 4 MG 24 hr capsule Commonly known as: DETROL Burns TAKE 1 CAPSULE BY MOUTH ONCE EVERY MORNING   vitamin C 1000 MG tablet Take 1,000 mg by mouth daily.       Allergies:  Allergies  Allergen Reactions  . Ciprofloxacin Other (See Comments)    Sister denies  . Levofloxacin Other (See Comments)    Sister denies    Family History: Family History  Problem Relation Age of Onset  . Colon cancer Maternal Grandfather        stomach/colon  . Heart disease Father        myocardial infarction  . Hyperlipidemia Father   . Hyperlipidemia Sister   . Diabetes Sister   . Bone cancer Other        cousin  . Alzheimer's disease Mother        maternal aunts  . Skin cancer Mother   . Myasthenia gravis Brother        ocular  . Skin cancer Brother   . Stroke Paternal Grandfather   . Breast cancer Neg Hx     Social History:  reports that she has never smoked. She has never used smokeless tobacco. She reports that she does not drink alcohol or use drugs.   Physical Exam: BP 111/77   Pulse 70   Constitutional:  Alert and oriented, No acute distress. HEENT: [place] AT, moist mucus membranes.  Trachea midline, no masses. Cardiovascular: No clubbing, cyanosis, or edema. Respiratory: Normal respiratory effort, no increased work of breathing. Skin: No rashes, bruises or suspicious lesions. Neurologic: Grossly intact, no focal deficits, moving all 4 extremities. Psychiatric: Normal mood and affect.  Laboratory Data:  Lab Results  Component Value Date   CREATININE 0.87 06/08/2019    Pertinent Imaging: CLINICAL DATA:  Evaluate kidney mass  EXAM: CT ABDOMEN WITHOUT AND WITH CONTRAST  TECHNIQUE: Multidetector CT imaging of the abdomen was performed following the standard protocol before and following the bolus administration of intravenous contrast.  CONTRAST:  182mL OMNIPAQUE IOHEXOL 300 MG/ML  SOLN  COMPARISON:  MRI 02/21/2019  FINDINGS: Lower chest: No acute abnormality.  Hepatobiliary: No focal liver abnormality is seen. Status post cholecystectomy. No biliary dilatation.  Pancreas: Unremarkable. No pancreatic ductal dilatation or surrounding inflammatory changes.  Spleen: Normal in size without focal abnormality.  Adrenals/Urinary Tract: Unchanged appearance of bilateral adrenal nodules which appear low-attenuation in may represent benign adenomas. The arterial phase images are limited secondary to respiratory motion artifact.  The solid enhancing lesion involving the inferior pole of  left kidney is again noted. This is best seen on the delayed images as an area of relative decreased attenuation measuring 3.0 x 2.8 cm, image 32/6. Stable from previous exam. No new suspicious kidney lesions identified no hydronephrosis identified bilaterally.  Stomach/Bowel: Stomach is within normal limits. No evidence of bowel wall thickening, distention, or inflammatory changes.  Vascular/Lymphatic: No significant vascular findings are present. No enlarged abdominal or pelvic lymph nodes.  Other: No free fluid or fluid collections  Musculoskeletal: Scoliosis and degenerative disc disease identified  IMPRESSION: 1. Stable appearance of solid enhancing lesion involving the inferior pole of left kidney compatible with renal cell carcinoma. No evidence for nodal metastasis or solid organ metastasis. 2. Unchanged bilateral adrenal nodules which may represent benign adenomas.   Electronically Signed   By: Kerby Moors M.D.   On:  06/15/2019 13:37  I have personally reviewed the images and agree with radiologist interpretation.  Minimal to no change in left renal cell malignancy.  Assessment & Plan:    1. Left Renal Cell Carcinoma CT Abdomen w wo contrast stable w/ no change in interim which is reassuring  As per previous discussion, now that she is completed treatments for her malignant invasive thymoma, she and her sister are now interested in pursuing treatment of her renal mass.  She has declined surgery but may be interested in cryoablation.  We will plan on referring her to interventional radiology to discuss this further.  This case has been previously discussed at tumor board and appears amenable to this treatment modality.  Her sister is concerned about her history of myasthenia gravis and the patient will likely need to have an anesthesia consult and it may have a prolonged hospital course due to personal history of myasthenia gravis.  That being said, now that her thymoma has been treated, this may have also improved somewhat.  All questions were answered today.   F/u virtual visit in 6 months  [place] [facility] [address] [place], [place] [address] [phone]  I, Lucas Mallow, am acting as a scribe for Dr. Hollice Espy,  .I have reviewed the above documentation for accuracy and completeness, and I agree with the above.   Hollice Espy, MD  I spent 30 min with this patient of which greater than 50% was spent in counseling and coordination of care with the patient.

## 2019-06-22 ENCOUNTER — Other Ambulatory Visit: Payer: Self-pay

## 2019-06-22 ENCOUNTER — Ambulatory Visit: Payer: PPO | Admitting: Urology

## 2019-06-22 ENCOUNTER — Other Ambulatory Visit: Payer: Self-pay | Admitting: Radiology

## 2019-06-22 VITALS — BP 111/77 | HR 70

## 2019-06-22 DIAGNOSIS — C642 Malignant neoplasm of left kidney, except renal pelvis: Secondary | ICD-10-CM

## 2019-06-22 DIAGNOSIS — I10 Essential (primary) hypertension: Secondary | ICD-10-CM | POA: Diagnosis not present

## 2019-06-22 DIAGNOSIS — G4733 Obstructive sleep apnea (adult) (pediatric): Secondary | ICD-10-CM | POA: Diagnosis not present

## 2019-06-28 ENCOUNTER — Ambulatory Visit
Admission: RE | Admit: 2019-06-28 | Discharge: 2019-06-28 | Disposition: A | Discharge status: Home / Self Care | Payer: PPO | Source: Ambulatory Visit | Attending: Urology | Admitting: Urology

## 2019-06-28 ENCOUNTER — Other Ambulatory Visit: Payer: Self-pay

## 2019-06-28 DIAGNOSIS — C642 Malignant neoplasm of left kidney, except renal pelvis: Secondary | ICD-10-CM | POA: Diagnosis not present

## 2019-06-28 HISTORY — PX: IR RADIOLOGIST EVAL & MGMT: IMG5224

## 2019-06-28 NOTE — Consult Note (Signed)
Chief Complaint: Patient was consulted remotely today (TeleHealth) for left renal cell carcinoma at the request of Nellie.    Referring Physician(s): Brandon,Ashley  History of Present Illness: Madison Burns is a 69 y.o. female with multiple medical problems including developmental delay, myasthenia gravis, malignant invasive thymoma  and left renal cell carcinoma.  I spoke predominantly with her sister who serves as her primary caregiver via our telephone conference today.  Her left lower pole renal lesion was first incidentally noted on a CT scan of the abdomen and pelvis from 02/07/2019.  Subsequent work-up with MRI performed to evaluate her right upper quadrant pain and left-sided renal mass was performed on 02/21/2019.  The MRI identified and endophytic 3.2 x 2.6 cm avidly enhancing lesion in the posterior lower pole of the left kidney.  Unfortunately, evaluation was somewhat limited by respiratory motion artifact.  Additionally, a lesion was identified in the mediastinum which was subsequently further evaluated by a dedicated chest CT.  She then underwent biopsy of both the left lower pole renal lesion and the mediastinal mass in February 2021.  She underwent a CT biopsy on 03/28/2019.Biopsy consistent with renal cell carcinoma.   Pathology for the mediastinal mass came back as positive for invasive thymoma without evidence of malignancy.  The pathology for the left lower pole renal lesion came back as positive for renal cell carcinoma.  She first underwent treatment for her thymoma in which she received radiation treatment on 06/08/19 and underwent 2 cycles of reduced carboplatin and Taxol, now complete.  Her sister reports that she tolerated both the chemotherapy and the radiation therapy very well with no complications or side effects.  Surveillance CT Abdomen w wo contrast from 06/15/19 revealed stable appearance of her biopsy-proven left lower pole renal cell carcinoma.  I  have reviewed her imaging independently.  The lesion is quite endophytic and extends toward the renal collecting system and the hilum.  By my measurements, the lesion measures up to 4 cm in diameter, slightly larger than previously reported.  Of note, her sister reports that Madison Burns his symptoms of myasthenia have improved since she underwent treatment of her thymoma.  She has begun whistling again which she has not done for a prolonged period of time.  She is currently in a state of good health and has no active complaints.  Past Medical History:  Diagnosis Date  . Cancer of kidney ([place]) 2021  . GERD (gastroesophageal reflux disease)   . History of seizure disorder   . Hypercholesterolemia   . Hypertension   . Irritable bowel syndrome   . Myasthenia gravis ([place]) 2011  . OSA on CPAP   . Skin cancer 2017   Melanoma on neck    Past Surgical History:  Procedure Laterality Date  . ABDOMINAL HYSTERECTOMY  1990  . APPENDECTOMY  1999  . CHOLECYSTECTOMY  2013  . COLONOSCOPY WITH PROPOFOL N/A 06/30/2017   Procedure: COLONOSCOPY WITH PROPOFOL;  Surgeon: Toledo, Benay Pike, MD;  Location: [facility];  Service: Gastroenterology;  Laterality: N/A;  . ESOPHAGOGASTRODUODENOSCOPY (EGD) WITH PROPOFOL N/A 06/30/2017   Procedure: ESOPHAGOGASTRODUODENOSCOPY (EGD) WITH PROPOFOL;  Surgeon: Toledo, Benay Pike, MD;  Location: [facility];  Service: Gastroenterology;  Laterality: N/A;  . TONSILLECTOMY  1959    Allergies: Ciprofloxacin and Levofloxacin  Medications: Prior to Admission medications   Medication Sig Start Date End Date Taking? Authorizing Provider  Ascorbic Acid (VITAMIN C) 1000 MG tablet Take 1,000 mg by mouth daily.  [provider]  b complex vitamins tablet Take 1 tablet by mouth daily.    [provider]  benzonatate (TESSALON) 100 MG capsule TAKE 1 CAPSULE BY MOUTH TWICE DAILY AS NEEDED COUGH 05/19/19   Einar Pheasant, MD  Biotin 10000 MCG TABS Take 1 capsule  by mouth daily.    [provider]  Calcium-Phosphorus-Vitamin D (CITRACAL +D3 PO) Take 25-1,000 mcg by mouth daily.    [provider]  folic acid (FOLVITE) 1 MG tablet Take 2 tablets (2 mg total) by mouth daily. 03/09/19   Dohmeier, Asencion Partridge, MD  levothyroxine (SYNTHROID) 75 MCG tablet TAKE 1 TABLET BY MOUTH DAILY ON AN EMPTYSTOMACH. WAIT 30 MINUTES BEFORE TAKING OTHER MEDS 05/21/19   Einar Pheasant, MD  OVER THE COUNTER MEDICATION Take 1 tablet by mouth daily. BRAIN ENERGIZER    [provider]  pantoprazole (PROTONIX) 40 MG tablet 40 mg daily. 11/24/17   [provider]  Probiotic Product (ALIGN PO) Take 1 capsule by mouth daily.    [provider]  pyridostigmine (MESTINON) 60 MG tablet TAKE 1 TABLET BY MOUTH AT  8 AM, ONE AT LUNCH AND ONE AT 8PM 11/03/18   Lomax, Amy, NP  risperiDONE (RISPERDAL) 2 MG tablet  07/08/18   [provider]  sucralfate (CARAFATE) 1 g tablet Take 1 g by mouth 2 (two) times daily. 05/25/17   [provider]  tolterodine (DETROL LA) 4 MG 24 hr capsule TAKE 1 CAPSULE BY MOUTH ONCE EVERY MORNING 01/07/19   Einar Pheasant, MD     Family History  Problem Relation Age of Onset  . Colon cancer Maternal Grandfather        stomach/colon  . Heart disease Father        myocardial infarction  . Hyperlipidemia Father   . Hyperlipidemia Sister   . Diabetes Sister   . Bone cancer Other        cousin  . Alzheimer's disease Mother        maternal aunts  . Skin cancer Mother   . Myasthenia gravis Brother        ocular  . Skin cancer Brother   . Stroke Paternal Grandfather   . Breast cancer Neg Hx     Social History   Socioeconomic History  . Marital status: Single    Spouse name: Not on file  . Number of children: 0  . Years of education: Special Ed  . Highest education level: Not on file  Occupational History  . Occupation: Unemployed  Tobacco Use  . Smoking status: Never Smoker  . Smokeless tobacco:  Never Used  Substance and Sexual Activity  . Alcohol use: No    Alcohol/week: 0.0 standard drinks  . Drug use: No  . Sexual activity: Not Currently  Other Topics Concern  . Not on file  Social History Narrative   06/23/17 lives with sister, [name]   Regular exercise-no   Caffeine Use-yes   Social Determinants of Health   Financial Resource Strain:   . Difficulty of Paying Living Expenses:   Food Insecurity:   . Worried About Charity fundraiser in the Last Year:   . Arboriculturist in the Last Year:   Transportation Needs:   . Film/video editor (Medical):   [name] Lack of Transportation (Non-Medical):   Physical Activity:   . Days of Exercise per Week:   . Minutes of Exercise per Session:   Stress:   . Feeling of Stress :  Social Connections:   . Frequency of Communication with Friends and Family:   . Frequency of Social Gatherings with Friends and Family:   . Attends Religious Services:   . Active Member of Clubs or Organizations:   . Attends Archivist Meetings:   [name] Marital Status:     ECOG Status: 2 - Symptomatic, <50% confined to bed  Review of Systems  Review of Systems: A 12 point ROS discussed and pertinent positives are indicated in the HPI above.  All other systems are negative.  Physical Exam No direct physical exam was performed (except for noted visual exam findings with Video Visits).   Vital Signs: There were no vitals taken for this visit.  Imaging: CT Abd Wo & W Cm  Result Date: 06/15/2019 CLINICAL DATA:  Evaluate kidney mass EXAM: CT ABDOMEN WITHOUT AND WITH CONTRAST TECHNIQUE: Multidetector CT imaging of the abdomen was performed following the standard protocol before and following the bolus administration of intravenous contrast. CONTRAST:  111mL OMNIPAQUE IOHEXOL 300 MG/ML  SOLN COMPARISON:  MRI 02/21/2019 FINDINGS: Lower chest: No acute abnormality. Hepatobiliary: No focal liver abnormality is seen. Status post cholecystectomy. No  biliary dilatation. Pancreas: Unremarkable. No pancreatic ductal dilatation or surrounding inflammatory changes. Spleen: Normal in size without focal abnormality. Adrenals/Urinary Tract: Unchanged appearance of bilateral adrenal nodules which appear low-attenuation in may represent benign adenomas. The arterial phase images are limited secondary to respiratory motion artifact. The solid enhancing lesion involving the inferior pole of left kidney is again noted. This is best seen on the delayed images as an area of relative decreased attenuation measuring 3.0 x 2.8 cm, image 32/6. Stable from previous exam. No new suspicious kidney lesions identified no hydronephrosis identified bilaterally. Stomach/Bowel: Stomach is within normal limits. No evidence of bowel wall thickening, distention, or inflammatory changes. Vascular/Lymphatic: No significant vascular findings are present. No enlarged abdominal or pelvic lymph nodes. Other: No free fluid or fluid collections Musculoskeletal: Scoliosis and degenerative disc disease identified IMPRESSION: 1. Stable appearance of solid enhancing lesion involving the inferior pole of left kidney compatible with renal cell carcinoma. No evidence for nodal metastasis or solid organ metastasis. 2. Unchanged bilateral adrenal nodules which may represent benign adenomas. Electronically Signed   By: Kerby Moors M.D.   On: 06/15/2019 13:37    Labs:  CBC: Recent Labs    05/18/19 0811 05/26/19 0920 06/01/19 1325 06/08/19 1017  WBC 3.2* 6.8 2.4* 3.1*  HGB 13.6 12.9 13.2 13.9  HCT 40.4 38.0 39.6 41.9  PLT 144* 142* 85* 139*    COAGS: Recent Labs    03/28/19 0953  INR 0.9    BMP: Recent Labs    05/18/19 0811 05/26/19 0920 06/01/19 1325 06/08/19 1017  NA 138 140 139 142  K 3.6 4.2 3.7 3.5  CL 107 104 104 107  CO2 24 27 26 25   GLUCOSE 116* 111* 98 92  BUN 14 16 20 14   CALCIUM 8.6* 8.7* 8.8* 8.2*  CREATININE 0.98 1.03* 0.93 0.87  GFRNONAA 60* 56* >60 >60    GFRAA >60 >60 >60 >60    LIVER FUNCTION TESTS: Recent Labs    05/18/19 0811 05/26/19 0920 06/01/19 1325 06/08/19 1017  BILITOT 0.4 0.9 0.7 0.3  AST 27 20 23 25   ALT 23 25 23 20   ALKPHOS 62 50 53 78  PROT 6.4* 6.3* 6.6 6.4*  ALBUMIN 3.6 3.6 3.9 3.7    TUMOR MARKERS: No results for input(s): AFPTM, CEA, CA199, CHROMGRNA in the last  8760 hours.  Assessment and Plan:  Additionally, she has a biopsy-proven endophytic left lower pole renal cell carcinoma measuring nearly 4 cm in diameter and extending to and likely abutting the collecting system.  She is a poor candidate for surgery given her underlying myasthenia.  Percutaneous cryoablation would involve a substantially shorter period of time under general anesthesia and may offer her a less invasive therapeutic option.  Due to the size and endophytic nature of the lesion and its close proximity to the renal collecting system, there is an increased risk for bleeding, collecting system injury with urinoma formation as well as incomplete ablation.  In order to maximize the safety and efficacy of the procedure, I would like to ask Dr. Erlene Quan to consider placement of a double-J ureteral stent prior to the ablation procedure in order to minimize the risk of collecting system injury and subsequent urinoma.  This will allow [place] to be more aggressive with the ablation zone and hopefully achieve complete local control.  1.)  Recommend referral to anesthesiology for preanesthesia evaluation to determine whether she would be a candidate for short duration general anesthesia. 2.)  If she is considered an acceptable candidate for short duration anesthesia, I will contact Dr. Erlene Quan to request left-sided double-J ureteral stent placement.  3.)  We will then schedule for percutaneous cryoablation to be performed at [facility].  Clotiel's sister understands our plan and wishes to proceed.  She feels that Ms. Burns is currently in the best  shape she has been in for some time and would like to proceed as quickly as possible.  Thank you for this interesting consult.  I greatly enjoyed meeting Madison Burns and look forward to participating in their care.  A copy of this report was sent to the requesting provider on this date.  Electronically Signed: Jacqulynn Cadet 06/28/2019, 2:44 PM   I spent a total of  40 Minutes  in remote  clinical consultation, greater than 50% of which was counseling/coordinating care for left lower pole renal cell carcinoma.    Visit type: Audio only (telephone). Audio (no video) only due to planned [place] video conference not functioning at the time of the consultation.. Alternative for in-person consultation at [facility], [address] [place], [place], [place]. This visit type was conducted due to national recommendations for restrictions regarding the COVID-19 Pandemic (e.g. social distancing).  This format is felt to be most appropriate for this patient at this time.  All issues noted in this document were discussed and addressed.

## 2019-06-29 ENCOUNTER — Encounter: Payer: Self-pay | Admitting: *Deleted

## 2019-06-30 ENCOUNTER — Other Ambulatory Visit: Payer: Self-pay | Admitting: Interventional Radiology

## 2019-06-30 ENCOUNTER — Other Ambulatory Visit (HOSPITAL_COMMUNITY): Payer: Self-pay | Admitting: Interventional Radiology

## 2019-06-30 DIAGNOSIS — C642 Malignant neoplasm of left kidney, except renal pelvis: Secondary | ICD-10-CM

## 2019-07-05 ENCOUNTER — Encounter: Payer: Self-pay | Admitting: Internal Medicine

## 2019-07-05 ENCOUNTER — Ambulatory Visit (INDEPENDENT_AMBULATORY_CARE_PROVIDER_SITE_OTHER): Payer: PPO | Admitting: Internal Medicine

## 2019-07-05 ENCOUNTER — Other Ambulatory Visit: Payer: Self-pay

## 2019-07-05 DIAGNOSIS — E7800 Pure hypercholesterolemia, unspecified: Secondary | ICD-10-CM

## 2019-07-05 DIAGNOSIS — C642 Malignant neoplasm of left kidney, except renal pelvis: Secondary | ICD-10-CM | POA: Diagnosis not present

## 2019-07-05 DIAGNOSIS — G40909 Epilepsy, unspecified, not intractable, without status epilepticus: Secondary | ICD-10-CM

## 2019-07-05 DIAGNOSIS — D4989 Neoplasm of unspecified behavior of other specified sites: Secondary | ICD-10-CM | POA: Diagnosis not present

## 2019-07-05 DIAGNOSIS — G4733 Obstructive sleep apnea (adult) (pediatric): Secondary | ICD-10-CM

## 2019-07-05 DIAGNOSIS — G7000 Myasthenia gravis without (acute) exacerbation: Secondary | ICD-10-CM

## 2019-07-05 DIAGNOSIS — M25512 Pain in left shoulder: Secondary | ICD-10-CM

## 2019-07-05 DIAGNOSIS — R739 Hyperglycemia, unspecified: Secondary | ICD-10-CM

## 2019-07-05 NOTE — Progress Notes (Signed)
Patient ID: Madison Burns, female   DOB: Feb 17, 1951, 69 y.o.   MRN: [id number]   Subjective:    Patient ID: Madison Burns, female    DOB: 1950-02-19, 69 y.o.   MRN: [id number]  HPI This visit occurred during the SARS-CoV-2 public health emergency.  Safety protocols were in place, including screening questions prior to the visit, additional usage of staff PPE, and extensive cleaning of exam room while observing appropriate contact time as indicated for disinfecting solutions.  Patient here for a scheduled follow up.  She is accompanied by her sister.  History obtained from both of them.  Recently diagnosed with thymoma and renal cell carcinoma. Saw Dr Ubaldo Glassing 06/22/19.  Stable.  No changes made.  Seeing Dr Erlene Quan for f/u renal cell carcinoma.  S/p radiation treatment and received chemotherapy.  F/u CT 06/15/19 - revealed stable appearance of kidney mass with no evidence of mets. Reports she is doing relatively well.  No chest pain.  Breathing stable.  Sister reports pt denies any abdominal pain.  Bowels stable.  Overall handling stress well.  Did report slipped at grocery store - discomfort left shoulder since.  Getting better.  Good rom without significant pain.      Past Medical History:  Diagnosis Date  . Cancer of kidney ([place]) 2021  . GERD (gastroesophageal reflux disease)   . History of seizure disorder   . Hypercholesterolemia   . Hypertension   . Irritable bowel syndrome   . Myasthenia gravis ([place]) 2011  . OSA on CPAP   . Skin cancer 2017   Melanoma on neck   Past Surgical History:  Procedure Laterality Date  . ABDOMINAL HYSTERECTOMY  1990  . APPENDECTOMY  1999  . CHOLECYSTECTOMY  2013  . COLONOSCOPY WITH PROPOFOL N/A 06/30/2017   Procedure: COLONOSCOPY WITH PROPOFOL;  Surgeon: Toledo, Benay Pike, MD;  Location: [facility] ENDOSCOPY;  Service: Gastroenterology;  Laterality: N/A;  . ESOPHAGOGASTRODUODENOSCOPY (EGD) WITH PROPOFOL N/A 06/30/2017   Procedure: ESOPHAGOGASTRODUODENOSCOPY (EGD)  WITH PROPOFOL;  Surgeon: Toledo, Benay Pike, MD;  Location: [facility] ENDOSCOPY;  Service: Gastroenterology;  Laterality: N/A;  . IR RADIOLOGIST EVAL & MGMT  06/28/2019  . TONSILLECTOMY  1959   Family History  Problem Relation Age of Onset  . Colon cancer Maternal Grandfather        stomach/colon  . Heart disease Father        myocardial infarction  . Hyperlipidemia Father   . Hyperlipidemia Sister   . Diabetes Sister   . Bone cancer Other        cousin  . Alzheimer's disease Mother        maternal aunts  . Skin cancer Mother   . Myasthenia gravis Brother        ocular  . Skin cancer Brother   . Stroke Paternal Grandfather   . Breast cancer Neg Hx    Social History   Socioeconomic History  . Marital status: Single    Spouse name: Not on file  . Number of children: 0  . Years of education: Special Ed  . Highest education level: Not on file  Occupational History  . Occupation: Unemployed  Tobacco Use  . Smoking status: Never Smoker  . Smokeless tobacco: Never Used  Substance and Sexual Activity  . Alcohol use: No    Alcohol/week: 0.0 standard drinks  . Drug use: No  . Sexual activity: Not Currently  Other Topics Concern  . Not on file  Social History Narrative  06/23/17 lives with sister, Madison Burns   Regular exercise-no   Caffeine Use-yes   Social Determinants of Health   Financial Resource Strain:   . Difficulty of Paying Living Expenses:   Food Insecurity:   . Worried About Charity fundraiser in the Last Year:   . Arboriculturist in the Last Year:   Transportation Needs:   . Film/video editor (Medical):   [name] Lack of Transportation (Non-Medical):   Physical Activity:   . Days of Exercise per Week:   . Minutes of Exercise per Session:   Stress:   . Feeling of Stress :   Social Connections:   . Frequency of Communication with Friends and Family:   . Frequency of Social Gatherings with Friends and Family:   . Attends Religious Services:   . Active Member of  Clubs or Organizations:   . Attends Archivist Meetings:   [name] Marital Status:     Outpatient Encounter Medications as of 07/05/2019  Medication Sig  . Ascorbic Acid (VITAMIN C) 1000 MG tablet Take 1,000 mg by mouth daily.  [name] b complex vitamins tablet Take 1 tablet by mouth daily.  . Biotin 10000 MCG TABS Take 1 capsule by mouth daily.  . Calcium-Phosphorus-Vitamin D (CITRACAL +D3 PO) Take 25-1,000 mcg by mouth daily.  . folic acid (FOLVITE) 1 MG tablet Take 2 tablets (2 mg total) by mouth daily.  [name] levothyroxine (SYNTHROID) 75 MCG tablet TAKE 1 TABLET BY MOUTH DAILY ON AN EMPTYSTOMACH. WAIT 30 MINUTES BEFORE TAKING OTHER MEDS  . OVER THE COUNTER MEDICATION Take 1 tablet by mouth daily. BRAIN ENERGIZER  . pantoprazole (PROTONIX) 40 MG tablet 40 mg daily.  . Probiotic Product (ALIGN PO) Take 1 capsule by mouth daily.  [name] pyridostigmine (MESTINON) 60 MG tablet TAKE 1 TABLET BY MOUTH AT  8 AM, ONE AT LUNCH AND ONE AT 8PM  . risperiDONE (RISPERDAL) 2 MG tablet   . sucralfate (CARAFATE) 1 g tablet Take 1 g by mouth 2 (two) times daily.  . [DISCONTINUED] benzonatate (TESSALON) 100 MG capsule TAKE 1 CAPSULE BY MOUTH TWICE DAILY AS NEEDED COUGH  . [DISCONTINUED] tolterodine (DETROL Burns) 4 MG 24 hr capsule TAKE 1 CAPSULE BY MOUTH ONCE EVERY MORNING   No facility-administered encounter medications on file as of 07/05/2019.    Review of Systems  Constitutional: Negative for appetite change and unexpected weight change.  HENT: Negative for congestion and sinus pressure.   Respiratory: Negative for cough, chest tightness and shortness of breath.   Cardiovascular: Negative for chest pain, palpitations and leg swelling.  Gastrointestinal: Negative for abdominal pain, diarrhea, nausea and vomiting.  Genitourinary: Negative for difficulty urinating and dysuria.  Musculoskeletal: Negative for joint swelling and myalgias.       Some shoulder discomfort - left.  Improved.    Skin: Negative for  color change and rash.  Neurological: Negative for dizziness, light-headedness and headaches.  Psychiatric/Behavioral: Negative for agitation and dysphoric mood.       Objective:    Physical Exam Constitutional:      General: She is not in acute distress.    Appearance: Normal appearance.  HENT:     Head: Normocephalic and atraumatic.     Right Ear: External ear normal.     Left Ear: External ear normal.  Eyes:     General:        Right eye: No discharge.        Left eye: No discharge.  Conjunctiva/sclera: Conjunctivae normal.  Neck:     Thyroid: No thyromegaly.  Cardiovascular:     Rate and Rhythm: Normal rate and regular rhythm.  Pulmonary:     Effort: No respiratory distress.     Breath sounds: Normal breath sounds. No wheezing.  Abdominal:     General: Bowel sounds are normal.     Palpations: Abdomen is soft.     Tenderness: There is no abdominal tenderness.  Musculoskeletal:        General: No swelling or tenderness.     Cervical back: Neck supple. No tenderness.     Comments: Good rom - left shoulder. Minimal discomfort with manipulation.    Lymphadenopathy:     Cervical: No cervical adenopathy.  Skin:    Findings: No erythema or rash.  Neurological:     Mental Status: She is alert.  Psychiatric:        Mood and Affect: Mood normal.        Behavior: Behavior normal.     BP 126/78   Pulse 90   Temp 97.9 F (36.6 C)   Resp 16   Ht '5\' 3"'$  (1.6 m)   Wt 213 lb (96.6 kg)   SpO2 98%   BMI 37.73 kg/m  Wt Readings from Last 3 Encounters:  07/15/19 210 lb 8 oz (95.5 kg)  07/05/19 213 lb (96.6 kg)  06/08/19 215 lb (97.5 kg)     Lab Results  Component Value Date   WBC 3.1 (L) 06/08/2019   HGB 13.9 06/08/2019   HCT 41.9 06/08/2019   PLT 139 (L) 06/08/2019   GLUCOSE 92 06/08/2019   CHOL 197 12/14/2018   TRIG 210.0 (H) 12/14/2018   HDL 36.80 (L) 12/14/2018   LDLDIRECT 122.0 12/14/2018   LDLCALC 110 (H) 08/06/2018   ALT 20 06/08/2019   AST 25  06/08/2019   NA 142 06/08/2019   K 3.5 06/08/2019   CL 107 06/08/2019   CREATININE 0.87 06/08/2019   BUN 14 06/08/2019   CO2 25 06/08/2019   TSH 2.07 03/30/2018   INR 0.9 03/28/2019   HGBA1C 5.4 12/14/2018    IR Radiologist Eval & Mgmt  Result Date: 06/29/2019 Please refer to notes tab for details about interventional procedure. (Op Note)      Assessment & Plan:   Problem List Items Addressed This Visit    Hypercholesterolemia    Low cholesterol diet and exercise.  Declines cholesterol medication.        Hyperglycemia    Low carb diet and exercise.  Follow met b and a1c.   Lab Results  Component Value Date   HGBA1C 5.4 12/14/2018        Left shoulder pain    Injury at grocery store.  Improved.  Good rom.  Discussed xray and further w/up.  Wants to monitor.  Will notify me if persistent problem.        Myasthenia gravis ([place])    Followed by neurology.  On mestinon and MTX.        OSA (obstructive sleep apnea)    CPAP.       Renal cell cancer, left [facility])    Being followed by urology.  Recent scan as outlined.  Stable.        Seizure disorder ([place])    Followed by neurology.  Off medication.  No recent seizures.        Thymoma    Seeing oncology and radiation oncology.  S/p chemo and XRT.  Madison Dobie, MD 

## 2019-07-06 ENCOUNTER — Other Ambulatory Visit: Payer: Self-pay

## 2019-07-06 ENCOUNTER — Other Ambulatory Visit: Payer: Self-pay | Admitting: Internal Medicine

## 2019-07-06 ENCOUNTER — Telehealth: Payer: Self-pay | Admitting: Internal Medicine

## 2019-07-06 MED ORDER — BENZONATATE 100 MG PO CAPS: ORAL_CAPSULE | ORAL | 1 refills | Status: DC

## 2019-07-06 NOTE — Telephone Encounter (Signed)
Refill sent in for tessalon perles. Pt uses prn. Updated COVID vaccines.

## 2019-07-06 NOTE — Telephone Encounter (Signed)
[name] called and stated that pt got [place] on 03/31/19 and 04/21/19. Pt is also out of refills on benzonatate (TESSALON) 100 MG capsule

## 2019-07-15 ENCOUNTER — Ambulatory Visit
Admission: RE | Admit: 2019-07-15 | Discharge: 2019-07-15 | Disposition: A | Discharge status: Home / Self Care | Payer: PPO | Source: Ambulatory Visit | Attending: Radiation Oncology | Admitting: Radiation Oncology

## 2019-07-15 ENCOUNTER — Telehealth: Payer: Self-pay | Admitting: Radiology

## 2019-07-15 ENCOUNTER — Encounter: Payer: Self-pay | Admitting: Radiation Oncology

## 2019-07-15 ENCOUNTER — Other Ambulatory Visit: Payer: Self-pay

## 2019-07-15 ENCOUNTER — Other Ambulatory Visit: Payer: Self-pay | Admitting: *Deleted

## 2019-07-15 ENCOUNTER — Ambulatory Visit: Payer: PPO | Admitting: Internal Medicine

## 2019-07-15 VITALS — BP 128/83 | Temp 96.6°F | Resp 16 | Wt 210.5 lb

## 2019-07-15 DIAGNOSIS — Z923 Personal history of irradiation: Secondary | ICD-10-CM | POA: Insufficient documentation

## 2019-07-15 DIAGNOSIS — D4989 Neoplasm of unspecified behavior of other specified sites: Secondary | ICD-10-CM

## 2019-07-15 DIAGNOSIS — C37 Malignant neoplasm of thymus: Secondary | ICD-10-CM | POA: Insufficient documentation

## 2019-07-15 DIAGNOSIS — G7000 Myasthenia gravis without (acute) exacerbation: Secondary | ICD-10-CM | POA: Diagnosis not present

## 2019-07-15 NOTE — Progress Notes (Signed)
Radiation Oncology Follow up Note  Name: Madison Burns   Date:   07/15/2019 MRN:  [id number] DOB: 11/23/1950    This 69 y.o. female presents to the clinic today for 1 month follow-up status post external beam radiation therapy to her mediastinum for thymoma.[place] stage IVa  REFERRING PROVIDER: Einar Pheasant, MD  HPI: Patient is a 69 year old female now at 1 month having completed external beam radiation therapy for.  Stage IVa thymoma the anterior mediastinum.  Patient has myasthenia gravis although this seems to have improved according to the sister over the past a month.  She seems more intellectually sharp answering questions on quick shows being able to sign her name and being able to get in to have certain neurologic functions like whistling return.  She specifically denies any dysphagia cough.  COMPLICATIONS OF TREATMENT: none  FOLLOW UP COMPLIANCE: keeps appointments   PHYSICAL EXAM:  BP 128/83 (BP Location: Left Arm, Patient Position: Sitting, Cuff Size: Large)   Temp (!) 96.6 F (35.9 C) (Tympanic)   Resp 16   Wt 210 lb 8 oz (95.5 kg)   BMI 37.29 kg/m  Well-developed well-nourished patient in NAD. HEENT reveals PERLA, EOMI, discs not visualized.  Oral cavity is clear. No oral mucosal lesions are identified. Neck is clear without evidence of cervical or supraclavicular adenopathy. Lungs are clear to A&P. Cardiac examination is essentially unremarkable with regular rate and rhythm without murmur rub or thrill. Abdomen is benign with no organomegaly or masses noted. Motor sensory and DTR levels are equal and symmetric in the upper and lower extremities. Cranial nerves II through XII are grossly intact. Proprioception is intact. No peripheral adenopathy or edema is identified. No motor or sensory levels are noted. Crude visual fields are within normal range.  RADIOLOGY RESULTS: CT scan ordered in 3 months  PLAN: At the present time patient has had excellent response to radiation  therapy for her stage IV thymoma.  I am pleased with her overall progress.  I have asked to see her back in 3 months with a CT scan of the chest prior to that visit.  Patient continues close follow-up care with medical oncology.  Patient knows to call with any concerns.  I would like to take this opportunity to thank you for allowing me to participate in the care of your patient.Noreene Filbert, MD

## 2019-07-15 NOTE — Telephone Encounter (Signed)
Sister reports patient discussed a stent placement prior to renal mass cryoablation with radiologist at [facility] around 1 month ago. Patient now has large bruise to the left of navel with a protruding knot which is painful. She is "concerned it is related to kidney cancer". Sister requests call from Dr Erlene Quan to [phone]. LMOM at [place] for more information.

## 2019-07-15 NOTE — Telephone Encounter (Signed)
Please return call.  Bruise on abd is very unlikely related to renal malignancy.  Find out when she is scheduled for cryoablation.  This is generally done in [place].  It may make most sense to see if one of the MDs at [facility] can do stent on say day as cryo (maybe [place]?).  Hollice Espy, MD

## 2019-07-15 NOTE — Telephone Encounter (Signed)
Per Madison Burns at [facility] they are awaiting an appointment with anesthesiology for an anesthesia clearance prior to ablation. She is following up on this and will call sister back with appointment information. Relayed to sister that Dr Erlene Quan feels the bruise is unlikely related to renal malignancy and that per [name], PA, she should contact PCP regarding this. Sister verbalized understanding.

## 2019-07-18 DIAGNOSIS — M25512 Pain in left shoulder: Secondary | ICD-10-CM | POA: Insufficient documentation

## 2019-07-18 NOTE — Assessment & Plan Note (Signed)
Seeing oncology and radiation oncology.  S/p chemo and XRT.

## 2019-07-18 NOTE — Assessment & Plan Note (Signed)
Low cholesterol diet and exercise.  Declines cholesterol medication.

## 2019-07-18 NOTE — Assessment & Plan Note (Signed)
Injury at grocery store.  Improved.  Good rom.  Discussed xray and further w/up.  Wants to monitor.  Will notify me if persistent problem.

## 2019-07-18 NOTE — Assessment & Plan Note (Signed)
Followed by neurology.  On mestinon and MTX.

## 2019-07-18 NOTE — Assessment & Plan Note (Signed)
CPAP.  

## 2019-07-18 NOTE — Assessment & Plan Note (Signed)
Low carb diet and exercise.  Follow met b and a1c.   Lab Results  Component Value Date   HGBA1C 5.4 12/14/2018

## 2019-07-18 NOTE — Assessment & Plan Note (Signed)
Followed by neurology.  Off medication.  No recent seizures.

## 2019-07-18 NOTE — Assessment & Plan Note (Signed)
Being followed by urology.  Recent scan as outlined.  Stable.

## 2019-07-20 ENCOUNTER — Other Ambulatory Visit (HOSPITAL_COMMUNITY): Payer: Self-pay | Admitting: Interventional Radiology

## 2019-07-20 DIAGNOSIS — C642 Malignant neoplasm of left kidney, except renal pelvis: Secondary | ICD-10-CM

## 2019-07-26 ENCOUNTER — Other Ambulatory Visit: Payer: Self-pay | Admitting: Urology

## 2019-08-01 ENCOUNTER — Other Ambulatory Visit: Payer: Self-pay | Admitting: Internal Medicine

## 2019-08-02 DIAGNOSIS — G4700 Insomnia, unspecified: Secondary | ICD-10-CM | POA: Diagnosis not present

## 2019-08-02 DIAGNOSIS — F79 Unspecified intellectual disabilities: Secondary | ICD-10-CM | POA: Diagnosis not present

## 2019-08-02 DIAGNOSIS — F39 Unspecified mood [affective] disorder: Secondary | ICD-10-CM | POA: Diagnosis not present

## 2019-08-04 NOTE — Patient Instructions (Addendum)
DUE TO COVID-19 ONLY ONE VISITOR IS ALLOWED TO COME WITH YOU AND STAY IN THE WAITING ROOM ONLY DURING PRE OP AND PROCEDURE DAY OF SURGERY. THE 2 VISITORS MAY VISIT WITH YOU AFTER SURGERY IN YOUR PRIVATE ROOM DURING VISITING HOURS ONLY!  YOU NEED TO HAVE A COVID 19 TEST ON_ Saturday 6/26______ @__9 :30_____, THIS TEST MUST BE DONE BEFORE SURGERY, COME  [address], [place] , [id number].  ([place]) ONCE YOUR COVID TEST IS COMPLETED, PLEASE BEGIN THE QUARANTINE INSTRUCTIONS AS OUTLINED IN YOUR HANDOUT.                [name]    Your procedure is scheduled on: 08/17/19   Report to [facility] Main  Entrance   Report to Short Stay at 5:30 AM     Call this number if you have problems the morning of surgery [phone]    Remember: Do not eat food or drink liquids :After Midnight.    BRUSH YOUR TEETH MORNING OF SURGERY AND RINSE YOUR MOUTH OUT, NO CHEWING GUM CANDY OR MINTS.     Take these medicines the morning of surgery with A SIP OF WATER: Mestinon. Pantoprazole, , Levothyroxine, Detrol, Bring mask and tubing to the hospital.                                 You may not have any metal on your body including hair pins and              piercings  Do not wear jewelry, make-up, lotions, powders or perfumes, deodorant             Do not wear nail polish on your fingernails.              Do not shave  48 hours prior to surgery.              Do not bring valuables to the hospital. [place].  Contacts, dentures or bridgework may not be worn into surgery.  Leave suitcase in the car. After surgery it may be brought to your room.               Please read over the following fact sheets you were given: _____________________________________________________________________             [facility] - Preparing for Surgery Before surgery, you can play an important role.   Because skin is not sterile,  your skin needs to be as free of germs as possible.   You can reduce the number of germs on your skin by washing with CHG (chlorahexidine gluconate) soap before surgery.   CHG is an antiseptic cleaner which kills germs and bonds with the skin to continue killing germs even after washing. Please DO NOT use if you have an allergy to CHG or antibacterial soaps.   If your skin becomes reddened/irritated stop using the CHG and inform your nurse when you arrive at Short Stay. Do not shave (including legs and underarms) for at least 48 hours prior to the first CHG shower.    Please follow these instructions carefully:  1.  Shower with CHG Soap the night before surgery and the  morning of Surgery.  2.  If you choose to wash your hair, wash your hair  first as usual with your  normal  shampoo.  3.  After you shampoo, rinse your hair and body thoroughly to remove the  shampoo.                                        4.  Use CHG as you would any other liquid soap.  You can apply chg directly  to the skin and wash                       Gently with a scrungie or clean washcloth.  5.  Apply the CHG Soap to your body ONLY FROM THE NECK DOWN.   Do not use on face/ open                           Wound or open sores. Avoid contact with eyes, ears mouth and genitals (private parts).                       Wash face,  Genitals (private parts) with your normal soap.             6.  Wash thoroughly, paying special attention to the area where your surgery  will be performed.  7.  Thoroughly rinse your body with warm water from the neck down.  8.  DO NOT shower/wash with your normal soap after using and rinsing off  the CHG Soap.            9.  Pat yourself dry with a clean towel.            10.  Wear clean pajamas.            11.  Place clean sheets on your bed the night of your first shower and do not  sleep with pets. Day of Surgery : Do not apply any lotions/deodorants the morning of surgery.  Please wear clean  clothes to the hospital/surgery center.  FAILURE TO FOLLOW THESE INSTRUCTIONS MAY RESULT IN THE CANCELLATION OF YOUR SURGERY PATIENT SIGNATURE_________________________________  NURSE SIGNATURE__________________________________  ________________________________________________________________________

## 2019-08-05 ENCOUNTER — Other Ambulatory Visit: Payer: Self-pay

## 2019-08-05 ENCOUNTER — Encounter (HOSPITAL_COMMUNITY): Payer: Self-pay

## 2019-08-05 ENCOUNTER — Encounter (HOSPITAL_COMMUNITY)
Admission: RE | Admit: 2019-08-05 | Discharge: 2019-08-05 | Disposition: A | Discharge status: Home / Self Care | Payer: PPO | Source: Ambulatory Visit | Attending: Urology | Admitting: Urology

## 2019-08-05 DIAGNOSIS — Z01812 Encounter for preprocedural laboratory examination: Secondary | ICD-10-CM | POA: Insufficient documentation

## 2019-08-05 HISTORY — DX: Other complications of anesthesia, initial encounter: T88.59XA

## 2019-08-05 HISTORY — DX: Hypothyroidism, unspecified: E03.9

## 2019-08-05 LAB — BASIC METABOLIC PANEL
Anion gap: 8 (ref 5–15)
BUN: 19 mg/dL (ref 8–23)
CO2: 25 mmol/L (ref 22–32)
Calcium: 8.6 mg/dL — ABNORMAL LOW (ref 8.9–10.3)
Chloride: 107 mmol/L (ref 98–111)
Creatinine, Ser: 0.76 mg/dL (ref 0.44–1.00)
GFR calc Af Amer: >60 mL/min (ref >60)
GFR calc non Af Amer: >60 mL/min (ref >60)
Glucose, Bld: 109 mg/dL — ABNORMAL HIGH (ref 70–99)
Potassium: 3.8 mmol/L (ref 3.5–5.1)
Sodium: 140 mmol/L (ref 135–145)

## 2019-08-05 LAB — CBC
HCT: 41.6 % (ref 36.0–46.0)
Hemoglobin: 13.7 g/dL (ref 12.0–15.0)
MCH: 32.5 pg (ref 26.0–34.0)
MCHC: 32.9 g/dL (ref 30.0–36.0)
MCV: 98.6 fL (ref 80.0–100.0)
Platelets: 127 10*3/uL — ABNORMAL LOW (ref 150–400)
RBC: 4.22 MIL/uL (ref 3.87–5.11)
RDW: 14.4 % (ref 11.5–15.5)
WBC: 6.6 10*3/uL (ref 4.0–10.5)
nRBC: 0 % (ref 0.0–0.2)

## 2019-08-05 NOTE — Progress Notes (Signed)
COVID Vaccine Completed:Yes Date COVID Vaccine completed:04/21/19 COVID vaccine manufacturer: Pfizer    PCP - Dr. Nicki Reaper  Cardiologist - Dr. Ubaldo Glassing  Chest x-ray - no EKG - 12/24/18 Stress Test - no ECHO - no Cardiac Cath - no  Sleep Study - Yes CPAP - Yes  Fasting Blood Sugar - NA Checks Blood Sugar _____ times a day  Blood Thinner Instructions:NA Aspirin Instructions: Last Dose:  Anesthesia review:   Patient denies shortness of breath, fever, cough and chest pain at PAT appointment  Yes  Patient verbalized understanding of instructions that were given to them at the PAT appointment. Patient was also instructed that they will need to review over the PAT instructions again at home before surgery. Yes  Pt's Sister [name] answered questions with Pt on speaker phone.  The Pt needs some assistance with ADLs and she is sedentary.

## 2019-08-15 ENCOUNTER — Other Ambulatory Visit (HOSPITAL_COMMUNITY)
Admission: RE | Admit: 2019-08-15 | Discharge: 2019-08-15 | Disposition: A | Discharge status: Home / Self Care | Payer: PPO | Source: Ambulatory Visit | Attending: Interventional Radiology | Admitting: Interventional Radiology

## 2019-08-15 ENCOUNTER — Other Ambulatory Visit: Payer: Self-pay | Admitting: Radiation Oncology

## 2019-08-15 DIAGNOSIS — Z20822 Contact with and (suspected) exposure to covid-19: Secondary | ICD-10-CM | POA: Insufficient documentation

## 2019-08-15 DIAGNOSIS — Z01812 Encounter for preprocedural laboratory examination: Secondary | ICD-10-CM | POA: Insufficient documentation

## 2019-08-15 LAB — SARS CORONAVIRUS 2 (TAT 6-24 HRS): SARS Coronavirus 2: NEGATIVE

## 2019-08-16 ENCOUNTER — Other Ambulatory Visit: Payer: Self-pay | Admitting: Radiology

## 2019-08-16 NOTE — Anesthesia Preprocedure Evaluation (Addendum)
Anesthesia Evaluation  Patient identified by MRN, date of birth, ID band Patient awake  General Assessment Comment:Patient able to answer basic questions, most answers provided by patient's sister at bedside  Reviewed: Allergy & Precautions, NPO status , Patient's Chart, lab work & pertinent test results  History of Anesthesia Complications Negative for: history of anesthetic complications  Airway Mallampati: III  TM Distance: >3 FB Neck ROM: Full    Dental no notable dental hx.    Pulmonary sleep apnea and Continuous Positive Airway Pressure Ventilation ,  Myasthenia gravis, thymoma s/p radiation    Pulmonary exam normal        Cardiovascular hypertension, Normal cardiovascular exam     Neuro/Psych Seizures - (x1 in 2012), Well Controlled,  negative psych ROS   GI/Hepatic Neg liver ROS, GERD  Medicated and Controlled,  Endo/Other  Hypothyroidism BMI 38  Renal/GU RCC  negative genitourinary   Musculoskeletal negative musculoskeletal ROS (+)   Abdominal   Peds  Hematology Hgb 13.3, plt 120k   Anesthesia Other Findings Day of surgery medications reviewed with patient.  Reproductive/Obstetrics negative OB ROS                           Anesthesia Physical Anesthesia Plan  ASA: III  Anesthesia Plan: General   Post-op Pain Management:    Induction: Intravenous  PONV Risk Score and Plan: 3 and Treatment may vary due to age or medical condition, Dexamethasone and Ondansetron  Airway Management Planned: Oral ETT  Additional Equipment: None  Intra-op Plan:   Post-operative Plan: Extubation in OR  Informed Consent: I have reviewed the patients History and Physical, chart, labs and discussed the procedure including the risks, benefits and alternatives for the proposed anesthesia with the patient or authorized representative who has indicated his/her understanding and acceptance.      Dental advisory given and Consent reviewed with POA  Plan Discussed with: CRNA  Anesthesia Plan Comments: (Patient discussed with Drs. Manny and Laurence Ferrari, will coordinate timing of procedures to perform continuous anesthetic. Patient has intellectual delay but can answer basic questions. Additional details provided by her sister (POA) at bedside. Myasthenic symptoms well controlled since 2011 diagnosis on Mestinon. Has never had aspiration PNA or required respiratory support beyond intubation/ICU care at time of diagnosis. No respiratory symptoms related to anterior mediastinal mass, is able to lie flat without issues. Patient's sister notes that since radiation for thymoma, patient has had improvement in mental status and motor skills. Daiva Huge, MD)       Anesthesia Quick Evaluation

## 2019-08-17 ENCOUNTER — Encounter (HOSPITAL_COMMUNITY)
Admission: RE | Disposition: A | Discharge status: Home / Self Care | Payer: Self-pay | Source: Home / Self Care | Attending: Interventional Radiology

## 2019-08-17 ENCOUNTER — Ambulatory Visit (HOSPITAL_COMMUNITY): Payer: PPO | Admitting: Anesthesiology

## 2019-08-17 ENCOUNTER — Other Ambulatory Visit: Payer: Self-pay

## 2019-08-17 ENCOUNTER — Observation Stay (HOSPITAL_COMMUNITY)
Admission: RE | Admit: 2019-08-17 | Discharge: 2019-08-18 | Disposition: A | Discharge status: Home / Self Care | Payer: PPO | Attending: Interventional Radiology | Admitting: Interventional Radiology

## 2019-08-17 ENCOUNTER — Ambulatory Visit (HOSPITAL_COMMUNITY): Payer: PPO

## 2019-08-17 ENCOUNTER — Ambulatory Visit (HOSPITAL_COMMUNITY): Payer: PPO | Admitting: Physician Assistant

## 2019-08-17 ENCOUNTER — Encounter (HOSPITAL_COMMUNITY): Payer: Self-pay | Admitting: Urology

## 2019-08-17 ENCOUNTER — Ambulatory Visit (HOSPITAL_COMMUNITY)
Admission: RE | Admit: 2019-08-17 | Discharge: 2019-08-17 | Disposition: A | Discharge status: Home / Self Care | Payer: PPO | Source: Ambulatory Visit | Attending: Interventional Radiology | Admitting: Interventional Radiology

## 2019-08-17 DIAGNOSIS — Z85238 Personal history of other malignant neoplasm of thymus: Secondary | ICD-10-CM | POA: Insufficient documentation

## 2019-08-17 DIAGNOSIS — Z923 Personal history of irradiation: Secondary | ICD-10-CM | POA: Insufficient documentation

## 2019-08-17 DIAGNOSIS — Z8249 Family history of ischemic heart disease and other diseases of the circulatory system: Secondary | ICD-10-CM | POA: Insufficient documentation

## 2019-08-17 DIAGNOSIS — N99821 Postprocedural hemorrhage and hematoma of a genitourinary system organ or structure following other procedure: Secondary | ICD-10-CM | POA: Insufficient documentation

## 2019-08-17 DIAGNOSIS — E039 Hypothyroidism, unspecified: Secondary | ICD-10-CM | POA: Insufficient documentation

## 2019-08-17 DIAGNOSIS — C642 Malignant neoplasm of left kidney, except renal pelvis: Principal | ICD-10-CM | POA: Insufficient documentation

## 2019-08-17 DIAGNOSIS — R109 Unspecified abdominal pain: Secondary | ICD-10-CM | POA: Diagnosis not present

## 2019-08-17 DIAGNOSIS — N2889 Other specified disorders of kidney and ureter: Secondary | ICD-10-CM | POA: Diagnosis present

## 2019-08-17 DIAGNOSIS — R6250 Unspecified lack of expected normal physiological development in childhood: Secondary | ICD-10-CM | POA: Diagnosis not present

## 2019-08-17 DIAGNOSIS — I10 Essential (primary) hypertension: Secondary | ICD-10-CM | POA: Insufficient documentation

## 2019-08-17 DIAGNOSIS — G7000 Myasthenia gravis without (acute) exacerbation: Secondary | ICD-10-CM | POA: Insufficient documentation

## 2019-08-17 DIAGNOSIS — G40909 Epilepsy, unspecified, not intractable, without status epilepticus: Secondary | ICD-10-CM | POA: Insufficient documentation

## 2019-08-17 DIAGNOSIS — Z01818 Encounter for other preprocedural examination: Secondary | ICD-10-CM

## 2019-08-17 DIAGNOSIS — G4733 Obstructive sleep apnea (adult) (pediatric): Secondary | ICD-10-CM | POA: Diagnosis not present

## 2019-08-17 HISTORY — PX: RADIOFREQUENCY ABLATION: SHX2290

## 2019-08-17 HISTORY — PX: CYSTOSCOPY W/ URETERAL STENT PLACEMENT: SHX1429

## 2019-08-17 LAB — CBC WITH DIFFERENTIAL/PLATELET
Abs Immature Granulocytes: 0.04 10*3/uL (ref 0.00–0.07)
Basophils Absolute: 0 10*3/uL (ref 0.0–0.1)
Basophils Relative: 0 %
Eosinophils Absolute: 0.1 10*3/uL (ref 0.0–0.5)
Eosinophils Relative: 1 %
HCT: 40.3 % (ref 36.0–46.0)
Hemoglobin: 13.3 g/dL (ref 12.0–15.0)
Immature Granulocytes: 1 %
Lymphocytes Relative: 13 %
Lymphs Abs: 0.8 10*3/uL (ref 0.7–4.0)
MCH: 32.2 pg (ref 26.0–34.0)
MCHC: 33 g/dL (ref 30.0–36.0)
MCV: 97.6 fL (ref 80.0–100.0)
Monocytes Absolute: 0.5 10*3/uL (ref 0.1–1.0)
Monocytes Relative: 8 %
Neutro Abs: 4.8 10*3/uL (ref 1.7–7.7)
Neutrophils Relative %: 77 %
Platelets: 120 10*3/uL — ABNORMAL LOW (ref 150–400)
RBC: 4.13 MIL/uL (ref 3.87–5.11)
RDW: 14 % (ref 11.5–15.5)
WBC: 6.3 10*3/uL (ref 4.0–10.5)
nRBC: 0 % (ref 0.0–0.2)

## 2019-08-17 LAB — TYPE AND SCREEN
ABO/RH(D): O POS
Antibody Screen: NEGATIVE

## 2019-08-17 LAB — CBC
HCT: 37.2 % (ref 36.0–46.0)
Hemoglobin: 11.9 g/dL — ABNORMAL LOW (ref 12.0–15.0)
MCH: 32.2 pg (ref 26.0–34.0)
MCHC: 32 g/dL (ref 30.0–36.0)
MCV: 100.8 fL — ABNORMAL HIGH (ref 80.0–100.0)
Platelets: 114 10*3/uL — ABNORMAL LOW (ref 150–400)
RBC: 3.69 MIL/uL — ABNORMAL LOW (ref 3.87–5.11)
RDW: 14.1 % (ref 11.5–15.5)
WBC: 14.3 10*3/uL — ABNORMAL HIGH (ref 4.0–10.5)
nRBC: 0 % (ref 0.0–0.2)

## 2019-08-17 LAB — PROTIME-INR
INR: 1 (ref 0.8–1.2)
Prothrombin Time: 12.6 seconds (ref 11.4–15.2)

## 2019-08-17 LAB — BASIC METABOLIC PANEL
Anion gap: 12 (ref 5–15)
BUN: 14 mg/dL (ref 8–23)
CO2: 23 mmol/L (ref 22–32)
Calcium: 8.5 mg/dL — ABNORMAL LOW (ref 8.9–10.3)
Chloride: 106 mmol/L (ref 98–111)
Creatinine, Ser: 0.9 mg/dL (ref 0.44–1.00)
GFR calc Af Amer: >60 mL/min (ref >60)
GFR calc non Af Amer: >60 mL/min (ref >60)
Glucose, Bld: 111 mg/dL — ABNORMAL HIGH (ref 70–99)
Potassium: 3.4 mmol/L — ABNORMAL LOW (ref 3.5–5.1)
Sodium: 141 mmol/L (ref 135–145)

## 2019-08-17 LAB — ABO/RH: ABO/RH(D): O POS

## 2019-08-17 SURGERY — RADIO FREQUENCY ABLATION
Anesthesia: General | Laterality: Left

## 2019-08-17 SURGERY — CYSTOSCOPY, WITH RETROGRADE PYELOGRAM AND URETERAL STENT INSERTION
Anesthesia: General | Laterality: Left

## 2019-08-17 MED ORDER — HYDROCODONE-ACETAMINOPHEN 5-325 MG PO TABS
1.0000 | ORAL_TABLET | ORAL | Status: DC | PRN
  Administered 2019-08-18: 1 via ORAL
  Filled 2019-08-17: qty 1

## 2019-08-17 MED ORDER — FENTANYL CITRATE (PF) 100 MCG/2ML IJ SOLN
INTRAMUSCULAR | Status: AC
  Filled 2019-08-17: qty 2

## 2019-08-17 MED ORDER — SUCRALFATE 1 G PO TABS
1.0000 g | ORAL_TABLET | Freq: Two times a day (BID) | ORAL | Status: DC
  Administered 2019-08-17 – 2019-08-18 (×2): 1 g via ORAL
  Filled 2019-08-17 (×2): qty 1

## 2019-08-17 MED ORDER — ROCURONIUM BROMIDE 10 MG/ML (PF) SYRINGE
PREFILLED_SYRINGE | INTRAVENOUS | Status: DC | PRN
  Administered 2019-08-17: 50 mg via INTRAVENOUS
  Administered 2019-08-17: 15 mg via INTRAVENOUS
  Administered 2019-08-17: 30 mg via INTRAVENOUS
  Administered 2019-08-17: 20 mg via INTRAVENOUS

## 2019-08-17 MED ORDER — DOCUSATE SODIUM 100 MG PO CAPS
100.0000 mg | ORAL_CAPSULE | Freq: Two times a day (BID) | ORAL | Status: DC
  Administered 2019-08-17: 100 mg via ORAL
  Filled 2019-08-17 (×2): qty 1

## 2019-08-17 MED ORDER — DEXAMETHASONE SODIUM PHOSPHATE 10 MG/ML IJ SOLN
INTRAMUSCULAR | Status: DC | PRN
  Administered 2019-08-17: 4 mg via INTRAVENOUS

## 2019-08-17 MED ORDER — LEVOTHYROXINE SODIUM 50 MCG PO TABS
75.0000 ug | ORAL_TABLET | Freq: Every day | ORAL | Status: DC
  Administered 2019-08-18: 75 ug via ORAL
  Filled 2019-08-17: qty 1

## 2019-08-17 MED ORDER — IOHEXOL 300 MG/ML  SOLN
100.0000 mL | Freq: Once | INTRAMUSCULAR | Status: AC | PRN
  Administered 2019-08-17: 100 mL via INTRAVENOUS

## 2019-08-17 MED ORDER — B COMPLEX-C PO TABS
1.0000 | ORAL_TABLET | Freq: Every evening | ORAL | Status: DC
  Administered 2019-08-17: 1 via ORAL
  Filled 2019-08-17 (×2): qty 1

## 2019-08-17 MED ORDER — PROPOFOL 500 MG/50ML IV EMUL
INTRAVENOUS | Status: DC | PRN
Start: 2019-08-17 — End: 2019-08-17
  Administered 2019-08-17: 50 ug/kg/min via INTRAVENOUS

## 2019-08-17 MED ORDER — FENTANYL CITRATE (PF) 250 MCG/5ML IJ SOLN
INTRAMUSCULAR | Status: AC
  Filled 2019-08-17: qty 5

## 2019-08-17 MED ORDER — VITAMIN E 180 MG (400 UNIT) PO CAPS
400.0000 [IU] | ORAL_CAPSULE | Freq: Every day | ORAL | Status: DC
  Administered 2019-08-18: 400 [IU] via ORAL
  Filled 2019-08-17: qty 1

## 2019-08-17 MED ORDER — PYRIDOSTIGMINE BROMIDE 60 MG PO TABS
60.0000 mg | ORAL_TABLET | Freq: Three times a day (TID) | ORAL | Status: DC
  Administered 2019-08-17 – 2019-08-18 (×3): 60 mg via ORAL
  Filled 2019-08-17 (×5): qty 1

## 2019-08-17 MED ORDER — PROPOFOL 10 MG/ML IV BOLUS
INTRAVENOUS | Status: AC
  Filled 2019-08-17: qty 20

## 2019-08-17 MED ORDER — ONDANSETRON HCL 4 MG/2ML IJ SOLN
INTRAMUSCULAR | Status: DC | PRN
  Administered 2019-08-17: 4 mg via INTRAVENOUS

## 2019-08-17 MED ORDER — FOLIC ACID 1 MG PO TABS
2.0000 mg | ORAL_TABLET | Freq: Every day | ORAL | Status: DC
  Administered 2019-08-18: 2 mg via ORAL
  Filled 2019-08-17: qty 2

## 2019-08-17 MED ORDER — CALCIUM CARBONATE-VITAMIN D 500-200 MG-UNIT PO TABS
1.0000 | ORAL_TABLET | Freq: Every day | ORAL | Status: DC
  Administered 2019-08-18: 1 via ORAL
  Filled 2019-08-17: qty 1

## 2019-08-17 MED ORDER — BENZONATATE 100 MG PO CAPS
100.0000 mg | ORAL_CAPSULE | ORAL | Status: DC
  Administered 2019-08-17 – 2019-08-18 (×3): 100 mg via ORAL
  Filled 2019-08-17 (×3): qty 1

## 2019-08-17 MED ORDER — PYRIDOSTIGMINE BROMIDE 60 MG PO TABS: 60.0000 mg | ORAL_TABLET | ORAL | Status: DC

## 2019-08-17 MED ORDER — LACTATED RINGERS IV SOLN
INTRAVENOUS | Status: DC | PRN
Start: 2019-08-17 — End: 2019-08-17

## 2019-08-17 MED ORDER — ONDANSETRON HCL 4 MG/2ML IJ SOLN
INTRAMUSCULAR | Status: AC
  Filled 2019-08-17: qty 2

## 2019-08-17 MED ORDER — SODIUM CHLORIDE 0.9 % IV SOLN: 2.0000 g | INTRAVENOUS | Status: DC

## 2019-08-17 MED ORDER — LACTATED RINGERS IV SOLN: INTRAVENOUS | Status: DC

## 2019-08-17 MED ORDER — SUGAMMADEX SODIUM 200 MG/2ML IV SOLN
INTRAVENOUS | Status: DC | PRN
  Administered 2019-08-17: 200 mg via INTRAVENOUS

## 2019-08-17 MED ORDER — ASCORBIC ACID 500 MG PO TABS
1000.0000 mg | ORAL_TABLET | Freq: Every day | ORAL | Status: DC
  Administered 2019-08-17: 1000 mg via ORAL
  Filled 2019-08-17: qty 2

## 2019-08-17 MED ORDER — PROMETHAZINE HCL 25 MG/ML IJ SOLN: 6.2500 mg | INTRAMUSCULAR | Status: DC | PRN

## 2019-08-17 MED ORDER — OXYCODONE HCL 5 MG PO TABS
5.0000 mg | ORAL_TABLET | Freq: Once | ORAL | Status: AC
  Administered 2019-08-17: 5 mg via ORAL

## 2019-08-17 MED ORDER — BIOTIN 10000 MCG PO TABS: 10000.0000 ug | ORAL_TABLET | Freq: Every day | ORAL | Status: DC

## 2019-08-17 MED ORDER — FENTANYL CITRATE (PF) 100 MCG/2ML IJ SOLN
25.0000 ug | INTRAMUSCULAR | Status: DC | PRN
  Administered 2019-08-17 (×5): 25 ug via INTRAVENOUS

## 2019-08-17 MED ORDER — CEFAZOLIN SODIUM-DEXTROSE 2-4 GM/100ML-% IV SOLN
INTRAVENOUS | Status: AC
  Filled 2019-08-17: qty 100

## 2019-08-17 MED ORDER — FESOTERODINE FUMARATE ER 8 MG PO TB24
8.0000 mg | ORAL_TABLET | Freq: Every day | ORAL | Status: DC
  Administered 2019-08-18: 8 mg via ORAL
  Filled 2019-08-17: qty 1

## 2019-08-17 MED ORDER — FENTANYL CITRATE (PF) 100 MCG/2ML IJ SOLN
25.0000 ug | INTRAMUSCULAR | Status: DC | PRN
  Administered 2019-08-17: 25 ug via INTRAVENOUS

## 2019-08-17 MED ORDER — CEFAZOLIN SODIUM-DEXTROSE 2-4 GM/100ML-% IV SOLN: 2.0000 g | INTRAVENOUS | Status: DC

## 2019-08-17 MED ORDER — CALCIUM-PHOSPHORUS-VITAMIN D 250-107-500 MG-MG-UNIT PO CHEW: CHEWABLE_TABLET | Freq: Every day | ORAL | Status: DC

## 2019-08-17 MED ORDER — RISPERIDONE 1 MG PO TABS
2.0000 mg | ORAL_TABLET | Freq: Every day | ORAL | Status: DC
  Administered 2019-08-17: 2 mg via ORAL
  Filled 2019-08-17: qty 2

## 2019-08-17 MED ORDER — LIDOCAINE 2% (20 MG/ML) 5 ML SYRINGE
INTRAMUSCULAR | Status: DC | PRN
  Administered 2019-08-17: 100 mg via INTRAVENOUS

## 2019-08-17 MED ORDER — ONDANSETRON HCL 4 MG/2ML IJ SOLN: 4.0000 mg | Freq: Four times a day (QID) | INTRAMUSCULAR | Status: DC | PRN

## 2019-08-17 MED ORDER — FENTANYL CITRATE (PF) 250 MCG/5ML IJ SOLN
INTRAMUSCULAR | Status: DC | PRN
  Administered 2019-08-17 (×4): 25 ug via INTRAVENOUS

## 2019-08-17 MED ORDER — OXYCODONE HCL 5 MG PO TABS
ORAL_TABLET | ORAL | Status: AC
  Filled 2019-08-17: qty 1

## 2019-08-17 MED ORDER — CHLORHEXIDINE GLUCONATE 0.12 % MT SOLN
15.0000 mL | Freq: Once | OROMUCOSAL | Status: AC
  Administered 2019-08-17: 15 mL via OROMUCOSAL
  Filled 2019-08-17: qty 15

## 2019-08-17 MED ORDER — ACETAMINOPHEN 500 MG PO TABS
ORAL_TABLET | ORAL | Status: AC
  Filled 2019-08-17: qty 2

## 2019-08-17 MED ORDER — IOHEXOL 300 MG/ML  SOLN
INTRAMUSCULAR | Status: DC | PRN
  Administered 2019-08-17: 10 mL

## 2019-08-17 MED ORDER — PANTOPRAZOLE SODIUM 40 MG PO TBEC
40.0000 mg | DELAYED_RELEASE_TABLET | Freq: Every day | ORAL | Status: DC
  Administered 2019-08-18: 40 mg via ORAL
  Filled 2019-08-17: qty 1

## 2019-08-17 MED ORDER — ORAL CARE MOUTH RINSE: 15.0000 mL | Freq: Once | OROMUCOSAL | Status: AC

## 2019-08-17 MED ORDER — CEFAZOLIN SODIUM-DEXTROSE 2-3 GM-%(50ML) IV SOLR
INTRAVENOUS | Status: DC | PRN
  Administered 2019-08-17: 2 g via INTRAVENOUS

## 2019-08-17 MED ORDER — SODIUM CHLORIDE (PF) 0.9 % IJ SOLN
INTRAMUSCULAR | Status: AC
  Filled 2019-08-17: qty 50

## 2019-08-17 MED ORDER — ACETAMINOPHEN 500 MG PO TABS
1000.0000 mg | ORAL_TABLET | Freq: Once | ORAL | Status: AC
  Administered 2019-08-17: 1000 mg via ORAL

## 2019-08-17 MED ORDER — SODIUM CHLORIDE 0.9 % IV SOLN
INTRAVENOUS | Status: AC
  Filled 2019-08-17: qty 250

## 2019-08-17 MED ORDER — DEXAMETHASONE SODIUM PHOSPHATE 10 MG/ML IJ SOLN
INTRAMUSCULAR | Status: AC
  Filled 2019-08-17: qty 1

## 2019-08-17 MED ORDER — PROPOFOL 10 MG/ML IV BOLUS
INTRAVENOUS | Status: AC
  Filled 2019-08-17: qty 40

## 2019-08-17 MED ORDER — PROPOFOL 10 MG/ML IV BOLUS
INTRAVENOUS | Status: DC | PRN
  Administered 2019-08-17: 190 mg via INTRAVENOUS

## 2019-08-17 SURGICAL SUPPLY — 16 items
BAG URO CATCHER STRL LF (MISCELLANEOUS) ×3 IMPLANT
BASKET ZERO TIP NITINOL 2.4FR (BASKET) IMPLANT
CATH INTERMIT  6FR 70CM (CATHETERS) IMPLANT
CLOTH BEACON ORANGE TIMEOUT ST (SAFETY) ×3 IMPLANT
GLOVE BIOGEL M STRL SZ7.5 (GLOVE) ×3 IMPLANT
GOWN STRL REUS W/TWL LRG LVL3 (GOWN DISPOSABLE) ×3 IMPLANT
GUIDEWIRE ANG ZIPWIRE 038X150 (WIRE) ×3 IMPLANT
GUIDEWIRE STR DUAL SENSOR (WIRE) IMPLANT
KIT TURNOVER KIT A (KITS) IMPLANT
MANIFOLD NEPTUNE II (INSTRUMENTS) ×3 IMPLANT
PACK CYSTO (CUSTOM PROCEDURE TRAY) ×3 IMPLANT
STENT URET 6FRX24 CONTOUR (STENTS) ×3 IMPLANT
TRAY FOLEY MTR SLVR 14FR STAT (SET/KITS/TRAYS/PACK) ×3 IMPLANT
TUBING CONNECTING 10 (TUBING) ×2 IMPLANT
TUBING CONNECTING 10' (TUBING) ×1
TUBING UROLOGY SET (TUBING) IMPLANT

## 2019-08-17 NOTE — Op Note (Signed)
Madison Burns, PERFETTI MEDICAL RECORD [id number] ACCOUNT [id number] DATE OF BIRTH:[birth year] FACILITY: [facility] LOCATION: [facility] PHYSICIAN:Racquelle Hyser, MD  OPERATIVE REPORT  DATE OF PROCEDURE:  08/17/2019  PREOPERATIVE DIAGNOSIS:  Enlarging left renal mass/renal cancer.  PROCEDURE: 1.  Cystoscopy with left retrograde pyelogram interpretation. 2.  Insertion of left ureteral stent 6 x 24, Contour, no tether.  SURGEON:  Alexis Frock, MD  ESTIMATED BLOOD LOSS:  Nil.  COMPLICATIONS:  None.  SPECIMENS:  None.  FINDINGS: 1.  Unremarkable urinary bladder. 2.  Unremarkable left retrograde pyelogram. 3.  Successful placement of left ureteral stent; proximal end in renal pelvis, distal end in urinary bladder.  INDICATIONS:  The patient is a pleasant but unfortunate 69 year old lady with history of developmental delay and myasthenia gravis.  She was found incidentally to have a fairly concerning lower pole left renal mass incidentally on axial imaging earlier  this year.  She was seen by one of our colleagues in [place] where biopsy was arranged, which was consistent with renal cell carcinoma.  She underwent a brief interval of surveillance; however, repeat imaging showed the mass was increasing in size.   After considerable discussion with the patient and her sister who is her primary caregiver, given competing risks, they have elected for primary ablation of the mass.  Given the depth, our interventional radiology colleagues have requested ureteral  stenting for renal protective purposes and she presents for this today in a combined fashion with stenting prior to ablation.  Informed consent was obtained and placed in medical record.  DESCRIPTION OF PROCEDURE:  The patient being the patient was verified.   Procedure being cystoscopy and  left stent placement was confirmed.  Procedure timeout was performed.  Intravenous antibiotics administered.  General endotracheal anesthesia   introduced.  The patient was placed into a low lithotomy position.  Sterile field was created by prepping and draping the patient's vagina, introitus and proximal thighs using iodine.  Cystourethroscopy was performed using 21-French rigid cystoscope with  offset lens.  Inspection of bladder revealed no diverticula, calcifications or papillary lesions.  Ureteral orifices were singleton bilaterally.  The left ureteral orifice was cannulated with a 6-French renal catheter and left retrograde pyelogram was  obtained.  Left retrograde pyelogram demonstrated a single left ureter with single system left kidney.  No filling defects or narrowing noted.  A 0.038 ZIPwire was advanced to the level of the upper pole, at which a new 6 x 24 Contour Polaris-type stent was placed  using cystoscopic and fluoroscopic guidance.  Good proximal and distal plane were noted.  A 16-French Foley catheter was placed free to straight drain; 10 mL of water in the balloon.  Anesthesia terminated.  The patient tolerated the procedure well.  No  immediate complications.  The patient will remain under general anesthesia and intubated with transfer to the IR suite for her ablation portion of her procedure today.  JN/NUANCE  D:08/17/2019 T:08/17/2019 JOB:[id number]

## 2019-08-17 NOTE — Anesthesia Procedure Notes (Signed)
Procedure Name: Intubation Date/Time: 08/17/2019 7:56 AM Performed by: [place] Kelyn Ponciano C, CRNA Pre-anesthesia Checklist: Patient identified, Emergency Drugs available, Suction available and Patient being monitored Patient Re-evaluated:Patient Re-evaluated prior to induction Oxygen Delivery Method: Circle system utilized Preoxygenation: Pre-oxygenation with 100% oxygen Induction Type: IV induction Ventilation: Mask ventilation without difficulty Laryngoscope Size: Mac and 3 Grade View: Grade I Tube type: Oral Tube size: 7.0 mm Number of attempts: 1 Airway Equipment and Method: Stylet and Oral airway Placement Confirmation: ETT inserted through vocal cords under direct vision,  positive ETCO2 and breath sounds checked- equal and bilateral Secured at: 22 cm Tube secured with: Tape Dental Injury: Teeth and Oropharynx as per pre-operative assessment

## 2019-08-17 NOTE — H&P (Signed)
Madison Burns is an 69 y.o. female.    Chief Complaint: Pre-Op LEFT Ureteral Stent Placement / Renal Cryoablation  HPI:   1 - LEFT Renal Mass - BX-proven left renal mass 03/2019 progressive on surveillance to 4cm. She andher family have chosen IR renal cryo as primary therapy. 1 artery / 1 vein (lumbar below artery) left renovascular anatomy. Normal contralateral kidney.   PMH sig for thymoma (now treated), TAH, MG, Dev'p Delay (lives with family).  Today "Man" is seen to proceed with LEFT ureteral stent placement prior to left renal cryo by Dr. Jenetta Loges later today. No interval fevers. Cr 0.7, Hgb 13, C19 screen negative.   Past Medical History:  Diagnosis Date  . Cancer of kidney ([place]) 2021  . Complication of anesthesia    Myasthenia gravis  . GERD (gastroesophageal reflux disease)   . History of seizure disorder   . Hypercholesterolemia   . Hypertension    no longer has it. wt loss  . Hypothyroidism   . Irritable bowel syndrome   . Myasthenia gravis ([place]) 2011  . OSA on CPAP   . Skin cancer 2017   Melanoma on neck    Past Surgical History:  Procedure Laterality Date  . ABDOMINAL HYSTERECTOMY  1990  . APPENDECTOMY  1999  . CHOLECYSTECTOMY  2013  . COLONOSCOPY WITH PROPOFOL N/A 06/30/2017   Procedure: COLONOSCOPY WITH PROPOFOL;  Surgeon: Toledo, Benay Pike, MD;  Location: [facility] ENDOSCOPY;  Service: Gastroenterology;  Laterality: N/A;  . ESOPHAGOGASTRODUODENOSCOPY (EGD) WITH PROPOFOL N/A 06/30/2017   Procedure: ESOPHAGOGASTRODUODENOSCOPY (EGD) WITH PROPOFOL;  Surgeon: Toledo, Benay Pike, MD;  Location: [facility] ENDOSCOPY;  Service: Gastroenterology;  Laterality: N/A;  . IR RADIOLOGIST EVAL & MGMT  06/28/2019  . TONSILLECTOMY  1959    Family History  Problem Relation Age of Onset  . Colon cancer Maternal Grandfather        stomach/colon  . Heart disease Father        myocardial infarction  . Hyperlipidemia Father   . Hyperlipidemia Sister   . Diabetes Sister   . Bone  cancer Other        cousin  . Alzheimer's disease Mother        maternal aunts  . Skin cancer Mother   . Myasthenia gravis Brother        ocular  . Skin cancer Brother   . Stroke Paternal Grandfather   . Breast cancer Neg Hx    Social History:  reports that she has never smoked. She has never used smokeless tobacco. She reports that she does not drink alcohol and does not use drugs.  Allergies:  Allergies  Allergen Reactions  . Ciprofloxacin Other (See Comments)    Sister denies  . Levofloxacin Other (See Comments)    Sister denies    No medications prior to admission.    Results for orders placed or performed during the hospital encounter of 08/15/19 (from the past 48 hour(s))  SARS CORONAVIRUS 2 (TAT 6-24 HRS) Nasopharyngeal Nasopharyngeal Swab     Status: None   Collection Time: 08/15/19 10:49 AM   Specimen: Nasopharyngeal Swab  Result Value Ref Range   SARS Coronavirus 2 NEGATIVE NEGATIVE    Comment: (NOTE) SARS-CoV-2 target nucleic acids are NOT DETECTED.  The SARS-CoV-2 RNA is generally detectable in upper and lower respiratory specimens during the acute phase of infection. Negative results do not preclude SARS-CoV-2 infection, do not rule out co-infections with other pathogens, and should not be used as the  sole basis for treatment or other patient management decisions. Negative results must be combined with clinical observations, patient history, and epidemiological information. The expected result is Negative.  Fact Sheet for Patients: SugarRoll.be  Fact Sheet for Healthcare Providers: https://www.woods-mathews.com/  This test is not yet approved or cleared by the [place] FDA and  has been authorized for detection and/or diagnosis of SARS-CoV-2 by FDA under an Emergency Use Authorization (EUA). This EUA will remain  in effect (meaning this test can be used) for the duration of the COVID-19 declaration under Se  ction 564(b)(1) of the Act, 21 U.S.C. section 360bbb-3(b)(1), unless the authorization is terminated or revoked sooner.  Performed at [facility], [place] [address], [place], [place] [address]    No results found.  Review of Systems  Constitutional: Negative for chills and fever.  All other systems reviewed and are negative.   There were no vitals taken for this visit. Physical Exam HENT:     Head: Normocephalic.     Nose: Nose normal.  Cardiovascular:     Rate and Rhythm: Normal rate.  Pulmonary:     Effort: Pulmonary effort is normal.  Abdominal:     General: Abdomen is flat.  Genitourinary:    Comments: No CVAT Skin:    General: Skin is warm.  Neurological:     General: No focal deficit present.  Psychiatric:        Mood and Affect: Mood normal.      Assessment/Plan  Proceed as planned with cysto, left retrograde, left ureteral stent placement, catheter placement for renal protection during cryo. Risks, benefits, alternatives, expected peri-treatmnet course discussed with pt and family who desire to proceed.   Alexis Frock, MD 08/17/2019, 5:28 AM

## 2019-08-17 NOTE — H&P (Signed)
Referring Physician(s): Camillia Herter  Supervising Physician: Jacqulynn Cadet  Patient Status:  [facility] OP TBA  Chief Complaint: Left renal cell carcinoma   Subjective: Patient familiar to IR service from anterior mediastinal mass biopsy and left renal mass biopsy on 03/28/2019.  She has a history of thymoma with prior chemoradiation as well as left renal cell carcinoma.  She underwent tele consultation with Dr. Laurence Ferrari on 06/28/2019 to discuss treatment options for the left renal cell carcinoma.  She was deemed an appropriate candidate for CT-guided cryoablation of the left RCC and presents today for the procedure.  Prior to the procedure the patient is undergoing left ureteral stent placement this morning in order to minimize risks of collecting system injury and subsequent urinoma during cryoablation.  Past medical history also significant for developmental delay, myasthenia gravis, GERD, seizure disorder, hypercholesterolemia, hypertension, IBS, obstructive sleep apnea on CPAP, prior melanoma left neck in 2017.  Per the patient's sister she has had no recent fevers, chills, headache, chest pain, dyspnea, cough, abdominal/back pain, nausea, vomiting or bleeding.  Past Medical History:  Diagnosis Date  . Cancer of kidney ([place]) 2021  . Complication of anesthesia    Myasthenia gravis  . GERD (gastroesophageal reflux disease)   . History of seizure disorder   . Hypercholesterolemia   . Hypertension    no longer has it. wt loss  . Hypothyroidism   . Irritable bowel syndrome   . Myasthenia gravis ([place]) 2011  . OSA on CPAP   . Skin cancer 2017   Melanoma on neck   Past Surgical History:  Procedure Laterality Date  . ABDOMINAL HYSTERECTOMY  1990  . APPENDECTOMY  1999  . CHOLECYSTECTOMY  2013  . COLONOSCOPY WITH PROPOFOL N/A 06/30/2017   Procedure: COLONOSCOPY WITH PROPOFOL;  Surgeon: Toledo, Benay Pike, MD;  Location: [facility]COPY;  Service: Gastroenterology;  Laterality: N/A;  .  ESOPHAGOGASTRODUODENOSCOPY (EGD) WITH PROPOFOL N/A 06/30/2017   Procedure: ESOPHAGOGASTRODUODENOSCOPY (EGD) WITH PROPOFOL;  Surgeon: Toledo, Benay Pike, MD;  Location: [facility];  Service: Gastroenterology;  Laterality: N/A;  . IR RADIOLOGIST EVAL & MGMT  06/28/2019  . TONSILLECTOMY  1959      Allergies: Patient has no known allergies.  Medications: Prior to Admission medications   Medication Sig Start Date End Date Taking? Authorizing Provider  Ascorbic Acid (VITAMIN C) 1000 MG tablet Take 1,000 mg by mouth at bedtime.   Yes [provider]  B Complex-C (B-COMPLEX WITH VITAMIN C) tablet Take 1 tablet by mouth every evening.   Yes [provider]  benzonatate (TESSALON) 100 MG capsule Take 1 capsule (100 mg total) by mouth in the morning and at bedtime. MORNING & 1430 08/01/19  Yes Einar Pheasant, MD  Biotin 10000 MCG TABS Take 10,000 mcg by mouth daily at 2 PM. (1430)   Yes [provider]  Calcium-Phosphorus-Vitamin D (CITRACAL +D3 PO) Take 1 tablet by mouth daily.   Yes [provider]  folic acid (FOLVITE) 1 MG tablet Take 2 tablets (2 mg total) by mouth daily. 03/09/19  Yes Dohmeier, Asencion Partridge, MD  Garlic 5726 MG CAPS Take 1,000 mg by mouth in the morning, at noon, and at bedtime. (0800, 1430, 2000)   Yes [provider]  levothyroxine (SYNTHROID) 75 MCG tablet TAKE 1 TABLET BY MOUTH DAILY ON AN EMPTYSTOMACH. WAIT [place] OTHER MEDS Patient taking differently: Take 75 mcg by mouth daily before breakfast.  05/21/19  Yes Einar Pheasant, MD  pantoprazole (PROTONIX) 40 MG tablet  Take 40 mg by mouth daily.  11/24/17  Yes [provider]  Probiotic Product (PROBIOTIC MULTI-ENZYME PO) Take 1 capsule by mouth in the morning and at bedtime. Probiotic Multi-Enzyme Digestive Formula   Yes [provider]  pyridostigmine (MESTINON) 60 MG tablet TAKE 1 TABLET BY MOUTH AT  8 AM, ONE AT LUNCH AND ONE AT 8PM Patient taking  differently: Take 60 mg by mouth See admin instructions. TAKE 1 TABLET BY MOUTH AT  8 AM, TAKE 1 TABLET AT LUNCH (1430) & TAKE 1 TABLET AT 8PM 11/03/18  Yes Lomax, Amy, NP  risperiDONE (RISPERDAL) 2 MG tablet Take 2 mg by mouth at bedtime.  07/08/18  Yes [provider]  sucralfate (CARAFATE) 1 g tablet Take 1 g by mouth 2 (two) times daily. (0800 & 1700) 05/25/17  Yes [provider]  tolterodine (DETROL LA) 4 MG 24 hr capsule TAKE 1 CAPSULE BY MOUTH ONCE EVERY MORNING Patient taking differently: Take 4 mg by mouth daily.  07/06/19  Yes Einar Pheasant, MD  vitamin E 180 MG (400 UNITS) capsule Take 400 Units by mouth daily at 2 PM. 1430   Yes [provider]     Vital Signs: BP (!) 154/86   Pulse 74   Temp 98.6 F (37 C) (Oral)   Resp 16   Wt 216 lb (98 kg)   SpO2 97%   BMI 38.26 kg/m   Physical Exam patient currently intubated, status post left ureteral stent placement this morning; chest clear to auscultation bilaterally.  Heart with regular rate and rhythm.  Abdomen soft, positive bowel sounds, no CVA tenderness.  Imaging: No results found.  Labs:  CBC: Recent Labs    06/01/19 1325 06/08/19 1017 08/05/19 1454 08/17/19 0632  WBC 2.4* 3.1* 6.6 6.3  HGB 13.2 13.9 13.7 13.3  HCT 39.6 41.9 41.6 40.3  PLT 85* 139* 127* 120*    COAGS: Recent Labs    03/28/19 0953 08/17/19 0632  INR 0.9 1.0    BMP: Recent Labs    06/01/19 1325 06/08/19 1017 08/05/19 1454 08/17/19 0632  NA 139 142 140 141  K 3.7 3.5 3.8 3.4*  CL 104 107 107 106  CO2 26 25 25 23   GLUCOSE 98 92 109* 111*  BUN 20 14 19 14   CALCIUM 8.8* 8.2* 8.6* 8.5*  CREATININE 0.93 0.87 0.76 0.90  GFRNONAA >60 >60 >60 >60  GFRAA >60 >60 >60 >60    LIVER FUNCTION TESTS: Recent Labs    05/18/19 0811 05/26/19 0920 06/01/19 1325 06/08/19 1017  BILITOT 0.4 0.9 0.7 0.3  AST 27 20 23 25   ALT 23 25 23 20   ALKPHOS 62 50 53 78  PROT 6.4* 6.3* 6.6 6.4*  ALBUMIN 3.6 3.6 3.9 3.7     Assessment and Plan: 69 yo female with history of thymoma with prior chemoradiation as well as 4 cm left renal cell carcinoma.  She underwent tele consultation with Dr. Laurence Ferrari on 06/28/2019 to discuss treatment options for the left renal cell carcinoma.  She was deemed an appropriate candidate for CT-guided cryoablation of the left RCC and presents today for the procedure.  Prior to the procedure the patient is undergoing left ureteral stent placement this morning in order to minimize risks of collecting system injury and subsequent urinoma during cryoablation.  Past medical history also significant for developmental delay, myasthenia gravis, GERD, seizure disorder, hypercholesterolemia, hypertension, IBS, obstructive sleep apnea on CPAP, prior melanoma left neck in 2017.  Details/risks of  above procedure, including but not limited to, internal bleeding, infection, injury to adjacent structures, anesthesia related complications discussed with patient/patient's sister with their understanding and consent.  Post procedure she will be admitted for overnight observation. This procedure involves the use of CT and because of the nature of the planned procedure, it is possible that we will have prolonged use of CT  Potential radiation risks to you include (but are not limited to) the following: - A slightly elevated risk for cancer  several years later in life. This risk is typically less than 0.5% percent. This risk is low in comparison to the normal incidence of human cancer, which is 33% for women and 50% for men according to the Congress. - Radiation induced injury can include skin redness, resembling a rash, tissue breakdown / ulcers and hair loss (which can be temporary or permanent).   The likelihood of either of these occurring depends on the difficulty of the procedure and whether you are sensitive to radiation due to previous procedures, disease, or genetic conditions.   IF  your procedure requires a prolonged use of radiation, you will be notified and given written instructions for further action.  It is your responsibility to monitor the irradiated area for the 2 weeks following the procedure and to notify your physician if you are concerned that you have suffered a radiation induced injury.      Electronically Signed: D. Rowe Robert, PA-C 08/17/2019, 8:16 AM   I spent a total of 30 minutes at the the patient's bedside AND on the patient's hospital floor or unit, greater than 50% of which was counseling/coordinating care for CT-guided cryoablation of left renal cell carcinoma

## 2019-08-17 NOTE — Progress Notes (Signed)
Patient ID: Madison Burns, female   DOB: 01-07-1951, 69 y.o.   MRN: [id number] Patient currently complaining of some mild to moderate left lower quadrant discomfort; not significantly tender in left flank ; no nausea or vomiting, resp issues or diaphoresis.  Sister in room. Vital signs stable, afebrile Puncture site left flank clean and dry, minimal tenderness; mild to moderate left lower abdominal tenderness to palpation; Foley catheter with blood-tinged urine Follow-up hemoglobin 11.9 ,down from 13.3   A/P: Patient with history of biopsy confirmed 4 cm left renal cell carcinoma, status post left ureteral stent by urology earlier today followed by CT-guided cryoablation of the left renal cell carcinoma; patient did experience delayed postprocedural hemorrhage following thawing of ice ball but remained hemodynamically stable.  For overnight observation ;continue to monitor vital signs closely and check a.m. CBC; hydrate; renal angiogram with possible embolization would only be performed if evidence of hemodynamic changes.  Follow-up with Dr. Laurence Ferrari in [facility] in 3 to 4 weeks.

## 2019-08-17 NOTE — Progress Notes (Signed)
Pt. placed on CPAP Auto @ this time after RN Med. Administration, tolerating well.

## 2019-08-17 NOTE — Brief Op Note (Signed)
08/17/2019  8:19 AM  PATIENT:  [name]  69 y.o. female  PRE-OPERATIVE DIAGNOSIS:  LEFT RENAL MASS  POST-OPERATIVE DIAGNOSIS:  LEFT RENAL MASS  PROCEDURE:  Procedure(s) with comments: CYSTOSCOPY WITH RETROGRADE PYELOGRAM/URETERAL STENT PLACEMENT (Left) - 30 MINS  SURGEON:  Surgeon(s) and Role:    Alexis Frock, MD - Primary  PHYSICIAN ASSISTANT:   ASSISTANTS: none   ANESTHESIA:   general  EBL:  minimal   BLOOD ADMINISTERED:none  DRAINS: Foley to gravity   LOCAL MEDICATIONS USED:  NONE  SPECIMEN:  No Specimen  DISPOSITION OF SPECIMEN:  N/A  COUNTS:  YES  TOURNIQUET:  * No tourniquets in log *  DICTATION: .Other Dictation: Dictation Number [id number]  PLAN OF CARE: remain intubated, to IR for renal ablation.   PATIENT DISPOSITION:  as per above   Delay start of Pharmacological VTE agent (>24hrs) due to surgical blood loss or risk of bleeding: yes

## 2019-08-17 NOTE — Anesthesia Postprocedure Evaluation (Signed)
Anesthesia Post Note  Patient: Madison Burns  Procedure(s) Performed: CYSTOSCOPY WITH RETROGRADE PYELOGRAM/URETERAL STENT PLACEMENT (Left ) LEFT RENAL CRYO ABLATION (Left )     Patient location during evaluation: PACU Anesthesia Type: General Level of consciousness: awake and alert and oriented Pain management: pain level controlled Vital Signs Assessment: post-procedure vital signs reviewed and stable Respiratory status: spontaneous breathing, nonlabored ventilation and respiratory function stable Cardiovascular status: blood pressure returned to baseline Postop Assessment: no apparent nausea or vomiting Anesthetic complications: no Comments: Concern for bleeding around ureter/kidney by IR physician following ablation procedure, observed in PACU for approximately 2 hours with no signs of hemodynamic instability. Will transfer to floor for further monitoring. Daiva Huge, MD   No complications documented.  Last Vitals:  Vitals:   08/17/19 1300 08/17/19 1315  BP: 127/84 117/84  Pulse: 71 69  Resp: 14 13  Temp:  36.6 C  SpO2: 99% 99%                  Brennan Bailey

## 2019-08-17 NOTE — Transfer of Care (Signed)
Immediate Anesthesia Transfer of Care Note  Patient: Madison Burns  Procedure(s) Performed: CYSTOSCOPY WITH RETROGRADE PYELOGRAM/URETERAL STENT PLACEMENT (Left ) LEFT RENAL CRYO ABLATION (Left )  Patient Location: PACU  Anesthesia Type:General  Level of Consciousness: awake  Airway & Oxygen Therapy: Patient Spontanous Breathing and Patient connected to face mask oxygen  Post-op Assessment: Report given to RN and Post -op Vital signs reviewed and stable  Post vital signs: Reviewed and stable  Last Vitals:  Vitals Value Taken Time  BP 112/73 08/17/19 1054  Temp    Pulse 73 08/17/19 1058  Resp 0 08/17/19 1057  SpO2 98 % 08/17/19 1058  Vitals shown include unvalidated device data.  Last Pain:  Vitals:   08/17/19 0643  TempSrc: Oral  PainSc:          Complications: No complications documented.

## 2019-08-17 NOTE — Procedures (Signed)
Interventional Radiology Procedure Note  Procedure: Percutaneous cryoablation of left renal mass.   Complications: Perinephric bleeding following procedure.  Patient being monitored closely, remains HD stable.    Estimated Blood Loss: 500 mL  Recommendations: - Watching vitals closely, will head to IR for angio/embo if evidence of continued bleeding - Bedrest x 6 hrs minimum - Watch foley out-put - CBC at 15:00 and again tomorrow am.  Transfuse as needed.  - Family aware of plan  Signed,  Criselda Peaches, MD

## 2019-08-17 NOTE — Progress Notes (Signed)

## 2019-08-18 ENCOUNTER — Encounter (HOSPITAL_COMMUNITY): Payer: Self-pay | Admitting: Interventional Radiology

## 2019-08-18 DIAGNOSIS — C642 Malignant neoplasm of left kidney, except renal pelvis: Secondary | ICD-10-CM | POA: Diagnosis not present

## 2019-08-18 DIAGNOSIS — N2889 Other specified disorders of kidney and ureter: Secondary | ICD-10-CM | POA: Diagnosis not present

## 2019-08-18 DIAGNOSIS — Z9889 Other specified postprocedural states: Secondary | ICD-10-CM | POA: Diagnosis not present

## 2019-08-18 LAB — CBC
HCT: 30.4 % — ABNORMAL LOW (ref 36.0–46.0)
Hemoglobin: 10.2 g/dL — ABNORMAL LOW (ref 12.0–15.0)
MCH: 32.2 pg (ref 26.0–34.0)
MCHC: 33.6 g/dL (ref 30.0–36.0)
MCV: 95.9 fL (ref 80.0–100.0)
Platelets: 114 10*3/uL — ABNORMAL LOW (ref 150–400)
RBC: 3.17 MIL/uL — ABNORMAL LOW (ref 3.87–5.11)
RDW: 14.1 % (ref 11.5–15.5)
WBC: 12.7 10*3/uL — ABNORMAL HIGH (ref 4.0–10.5)
nRBC: 0 % (ref 0.0–0.2)

## 2019-08-18 LAB — HEMOGLOBIN AND HEMATOCRIT, BLOOD
HCT: 31.1 % — ABNORMAL LOW (ref 36.0–46.0)
Hemoglobin: 10.2 g/dL — ABNORMAL LOW (ref 12.0–15.0)

## 2019-08-18 MED ORDER — HYDROCODONE-ACETAMINOPHEN 5-325 MG PO TABS: 1.0000 | ORAL_TABLET | Freq: Four times a day (QID) | ORAL | 0 refills | Status: DC | PRN

## 2019-08-18 NOTE — Progress Notes (Signed)
IR.  Patient with history of left RCC s/p image-guided left renal cryoablation 08/17/2019 by Dr. Laurence Ferrari.  Went to evaluate patient bedside this AM. Patient awake and alert sitting in bed watching TV. Complains of lower abdominal pain but denies flank pain. States she urinated early this AM and RN was in room and there was blood in urine, however per RN report no blood in urine. Heart RRR. Lungs CTA. VSS- BP 124/68 HR 96. Right flank puncture site soft without active bleeding or hematoma.  Discussed with Dr. Laurence Ferrari who recommends repeat H&H 6 hours from prior- if stable will discharge today. RN aware to call IR if blood in urine. Discussed with patient's sister/guardian and updated on plans.  IR to follow.   Bea Graff Jerold Yoss, PA-C 08/18/2019, 10:05 AM

## 2019-08-18 NOTE — Progress Notes (Signed)
Pt VS WNL.  Discharge instructions provided to and reviewed with pt's sister.  PIVs discontinued.  Catheters intact and clotting within expected timeframe.  Pt transported to front lobby via wheelchair.

## 2019-08-18 NOTE — Plan of Care (Signed)
Pt VS WNL this am.  Pt urine blood-tinged.  MD notified.  No complaints at this time.   Problem: Pain Managment: Goal: General experience of comfort will improve Outcome: Progressing   Problem: Safety: Goal: Ability to remain free from injury will improve Outcome: Progressing   Problem: Skin Integrity: Goal: Risk for impaired skin integrity will decrease Outcome: Progressing

## 2019-08-18 NOTE — Discharge Instructions (Signed)
1 - You may have urinary urgency (bladder spasms) and bloody urine on / off with stent in place. This is normal.  2 - Call MD or go to ER for fever >102, severe pain / nausea / vomiting not relieved by medications, or acute change in medical status    Renal Cryoablation, Care After This sheet gives you information about how to care for yourself after your procedure. Your health care provider may also give you more specific instructions. If you have problems or questions, contact your health care provider. What can I expect after the procedure? After the procedure, it is common to have:  Soreness around the treatment area.  Mild pain and swelling in the treatment area. Follow these instructions at home: Treatment area care  Follow instructions from your health care provider about how to take care of your incision. Make sure you: ? Wash your hands with soap and water before you change your bandage (dressing). If soap and water are not available, use hand sanitizer. ? Ok to shower 48 hours post-procedure. Recommend first shower with bandage on, remove bandage immediately after showering and pat area dry. No further dressing changes needed after this. Ensure area remains clean and dry until fully healed. ? No submerging (swimming, bathing) for 7 days post-procedure.  Check your treatment area every day for signs of infection. Check for: ? More redness, swelling, or pain. ? More fluid or blood. ? Warmth. ? Pus or a bad smell. Activity  Advance activity as tolerated.  Do not drive self while taking Norco. General instructions  Keep all follow-up visits as told by your health care provider. This is important. 3-4 week televisit with Dr. Laurence Ferrari.  Watch urinary output. Ensure urine continues to clear (yellow). If not clear in 48 hours or if clots are visible in urine, please go to Emergency Department. Contact a health care provider if:  You do not have a bowel movement for 2  days.  You have nausea or vomiting.  You have more redness, swelling, or pain around your treatment area.  You have more fluid or blood coming from your treatment area.  Your treatment area feels warm to the touch.  You have pus or a bad smell coming from your treatment area.  You have a fever. Get help right away if:  You have severe pain.  You have trouble swallowing or breathing.  You have severe weakness or dizziness.  You have chest pain or shortness of breath. This information is not intended to replace advice given to you by your health care provider. Make sure you discuss any questions you have with your health care provider. Document Revised: 01/16/2017 Document Reviewed: 07/04/2015 Elsevier Patient Education  [place].

## 2019-08-18 NOTE — Discharge Summary (Signed)
Patient ID: Madison Burns MRN: [id number] DOB/AGE: 68-Oct-1952 68 y.o.  Admit date: 08/17/2019 Discharge date: 08/18/2019  Supervising Physician: Jacqulynn Cadet  Patient Status: [facility] - In-pt  Admission Diagnoses: Left renal mass  Discharge Diagnoses:  Active Problems:   Left renal mass   Discharged Condition: stable  Hospital Course:  Patient presented to [facility] 08/17/2019 for two procedures due to left renal mass. She was first taken to OR with Dr. Tresa Moore for cystoscopy with retrograde ureteral stent placement for renal protection during cryoablation. This procedure occurred without major complications and patient was then taken to IR for an image-guided left renal mass cryoablation by Dr. Laurence Ferrari. Procedure complicated by perinephric bleeding following procedure. Patient was monitored closely and remained hemodynamically stable. She was then transferred to floor in stable condition (VSS, left flank puncture site stable) for overnight observation. Hgb down to 11.9 (from 13.3) following procedure. No major events occurred overnight.  Patient awake and alert sitting in bed. Complains of mild abdominal pain, denies flank pain. Flank puncture sites stable. Urine pink tinged, no clots noted. Hgb this AM down to 10.2, repeat 6 hours later with hgb stable at 10.2. Plan to discharge home today and follow-up with Dr. Laurence Ferrari for televisit 3-4 weeks after discharge.  Patient's sister/guardian, [name], updated on discharge instructions via telephone. All questions answered and concerns addressed.   Consults: None  Significant Diagnostic Studies: CT GUIDE TISSUE ABLATION  Result Date: 08/17/2019 INDICATION: 69 year old female with developmental delay, malignant invasive thymoma, myasthenia gravis and left-sided renal cell carcinoma. She is not considered an optimal surgical candidate for nephrectomy given her history of myasthenia gravis. She presents today following placement of a  left-sided double-J ureteral stent for percutaneous cryoablation. EXAM: Percutaneous cryoablation of left renal mass COMPARISON:  CT abdomen/pelvis 06/15/2019 MEDICATIONS: 2 g Ancef; The antibiotic was administered in an appropriate time interval prior to needle puncture of the skin. ANESTHESIA/SEDATION: General - as administered by the Anesthesia department FLUOROSCOPY TIME:  None. COMPLICATIONS: SIR Level A - No therapy, no consequence. TECHNIQUE: Informed written consent was obtained from the patient after a thorough discussion of the procedural risks, benefits and alternatives. All questions were addressed. Maximal Sterile Barrier Technique was utilized including caps, mask, sterile gowns, sterile gloves, sterile drape, hand hygiene and skin antiseptic. A timeout was performed prior to the initiation of the procedure. A planning axial CT scan was performed. The double-J ureteral stent is in good position. Careful comparison was made with the previous contrast enhanced images. The region of the mass was successfully localized. Using intermittent CT guidance, 3 ice force +probes were carefully advanced into the mass in a triangular configuration. Cryoablation was then performed using our standard protocol of 8 minutes freeze, 8 minutes active thaw, 8 minutes freeze. The ablation zone was monitored intermittently throughout the procedure. There was no evidence of complication during the cryo ablation. The ice ball formed very well and appeared to encompass the entirety of the expected location of the lesion. Following final thaw, the probes were tested for mobility, found to be easily movable and removed. Post procedure triple phase CT imaging was then performed. No evidence of complication on the pre contrast, and arterial phase images. On the nephrographic phase, there is some pooling of contrast material at the hilar margin of the ablation zone. On the 3 minutes delayed phase imaging, there is a developing  hemorrhage adjacent to the renal pelvis which extends inferiorly along the course of the ureter. Additional delayed imaging was  subsequently obtained which demonstrated further expansion of the hemorrhage into the posterior perinephric space tracking along the psoas muscle. The patient was monitored carefully and remained hemodynamically stable. The patient was transferred to PACU in good condition. FINDINGS: Postprocedural perinephric hemorrhage extending into the posterior perirenal space. Excellent ablation zone a encompassing the entirety of the left renal mass. IMPRESSION: 1. Successful percutaneous cryoablation. 2. There is evidence of delayed postprocedural hemorrhage following thawing of the ice ball. Patient remains hemodynamically stable and will be carefully monitored and H and H followed. Transfusion will be performed as needed. If bleeding is not self limited and there is evidence of hemodynamic changes, we will proceed with angiogram and possible embolization. Signed, Criselda Peaches, MD, [facility] Electronically Signed   By: Jacqulynn Cadet M.D.   On: 08/17/2019 14:12   DG C-Arm 1-60 Min-No Report  Result Date: 08/17/2019 Fluoroscopy was utilized by the requesting physician.  No radiographic interpretation.    Treatments: Left ureteral stent placement for renal protection; image-guided cryoablation of left renal mass  Discharge Exam: Blood pressure 124/68, pulse 96, temperature 98.9 F (37.2 C), temperature source Oral, resp. rate 17, weight 216 lb (98 kg), SpO2 93 %. Physical Exam Vitals and nursing note reviewed.  Constitutional:      General: She is not in acute distress.    Appearance: Normal appearance.  Cardiovascular:     Rate and Rhythm: Normal rate and regular rhythm.     Heart sounds: Normal heart sounds. No murmur heard.   Pulmonary:     Effort: Pulmonary effort is normal. No respiratory distress.       Breath sounds: Normal breath sounds. No wheezing.  Abdominal:     Palpations: Abdomen is soft.     Tenderness: There is no abdominal tenderness.     Comments: Left flank incisions soft without tenderness, erythema, drainage, or active bleeding.  Skin:    General: Skin is warm and dry.  Neurological:     Mental Status: She is alert.     Disposition: Discharge disposition: 01-Home or Self Care       Discharge Instructions    Call MD for:  difficulty breathing, headache or visual disturbances   Complete by: As directed    Call MD for:  extreme fatigue   Complete by: As directed    Call MD for:  hives   Complete by: As directed    Call MD for:  persistant dizziness or light-headedness   Complete by: As directed    Call MD for:  persistant nausea and vomiting   Complete by: As directed    Call MD for:  redness, tenderness, or signs of infection (pain, swelling, redness, odor or green/yellow discharge around incision site)   Complete by: As directed    Call MD for:  severe uncontrolled pain   Complete by: As directed    Call MD for:  temperature >100.4   Complete by: As directed    Diet - low sodium heart healthy   Complete by: As directed    Discharge instructions   Complete by: As directed    Ok to shower 48 hours post-procedure. Recommend first shower with bandage on, remove bandage immediately after showering and pat area dry. No further dressing changes needed after this. Ensure area remains clean and dry until fully healed. No submerging (swimming, bathing) for 7 days post-procedure. Take Norco 5/325 once every 6 hours as needed for severe pain. This medication  has been sent to your pharmacy for you. Watch urinary output. Ensure urine continues to clear (yellow). If not clear in 48 hours or if clots are visible in urine, please go to Emergency Department.   Increase activity slowly   Complete by: As directed    Remove dressing in 24 hours   Complete by: As  directed    Following first shower. See showering instructions for further information on this.     Allergies as of 08/18/2019   No Known Allergies     Medication List    TAKE these medications   B-complex with vitamin C tablet Take 1 tablet by mouth every evening.   benzonatate 100 MG capsule Commonly known as: TESSALON Take 1 capsule (100 mg total) by mouth in the morning and at bedtime. MORNING & 1430   Biotin 10000 MCG Tabs Take 10,000 mcg by mouth daily at 2 PM. (1430)   CITRACAL +D3 PO Take 1 tablet by mouth daily.   folic acid 1 MG tablet Commonly known as: FOLVITE Take 2 tablets (2 mg total) by mouth daily.   Garlic 9326 MG Caps Take 1,000 mg by mouth in the morning, at noon, and at bedtime. (0800, 1430, 2000)   HYDROcodone-acetaminophen 5-325 MG tablet Commonly known as: NORCO/VICODIN Take 1 tablet by mouth every 6 (six) hours as needed for severe pain.   levothyroxine 75 MCG tablet Commonly known as: SYNTHROID TAKE 1 TABLET BY MOUTH DAILY ON AN EMPTYSTOMACH. WAIT 30 MINUTES BEFORE TAKING OTHER MEDS What changed: See the new instructions.   pantoprazole 40 MG tablet Commonly known as: PROTONIX Take 40 mg by mouth daily.   PROBIOTIC MULTI-ENZYME PO Take 1 capsule by mouth in the morning and at bedtime. Probiotic Multi-Enzyme Digestive Formula   pyridostigmine 60 MG tablet Commonly known as: MESTINON TAKE 1 TABLET BY MOUTH AT  8 AM, ONE AT LUNCH AND ONE AT 8PM What changed:   how much to take  how to take this  when to take this  additional instructions   risperiDONE 2 MG tablet Commonly known as: RISPERDAL Take 2 mg by mouth at bedtime.   sucralfate 1 g tablet Commonly known as: CARAFATE Take 1 g by mouth 2 (two) times daily. (0800 & 1700)   tolterodine 4 MG 24 hr capsule Commonly known as: DETROL Burns TAKE 1 CAPSULE BY MOUTH ONCE EVERY MORNING What changed: See the new instructions.   vitamin C 1000 MG tablet Take 1,000 mg by mouth at  bedtime.   vitamin E 180 MG (400 UNITS) capsule Take 400 Units by mouth daily at 2 PM. 1430       Follow-up Information    Call Hollice Espy, MD.   Specialty: Urology Why: Please call to arrange office visit for stent removal in about 1 monht.  Contact information: [place] [address] [phone]        Jacqulynn Cadet, MD Follow up in 3 week(s).   Specialties: Interventional Radiology, Radiology Why: Please plan to follow-up with Dr. Laurence Ferrari for televisit 3-4 weeks after discharge. Our office will call you to set up this appointment. Contact information: [place] [address] [phone]                Electronically Signed: Earley Abide, PA-C 08/18/2019, 2:21 PM   I have spent Greater Than 30 Minutes discharging Madison Burns.

## 2019-08-23 ENCOUNTER — Other Ambulatory Visit: Payer: Self-pay | Admitting: Neurology

## 2019-08-23 ENCOUNTER — Telehealth: Payer: Self-pay | Admitting: Neurology

## 2019-08-23 DIAGNOSIS — G40309 Generalized idiopathic epilepsy and epileptic syndromes, not intractable, without status epilepticus: Secondary | ICD-10-CM

## 2019-08-23 DIAGNOSIS — G4733 Obstructive sleep apnea (adult) (pediatric): Secondary | ICD-10-CM

## 2019-08-23 DIAGNOSIS — F73 Profound intellectual disabilities: Secondary | ICD-10-CM

## 2019-08-23 DIAGNOSIS — G7000 Myasthenia gravis without (acute) exacerbation: Secondary | ICD-10-CM

## 2019-08-23 MED ORDER — PYRIDOSTIGMINE BROMIDE 60 MG PO TABS: ORAL_TABLET | ORAL | 6 refills | Status: DC

## 2019-08-23 NOTE — Telephone Encounter (Signed)
Pt is requesting a refill for pyridostigmine (MESTINON) 60 MG tablet .  Pharmacy: *[facility] **

## 2019-08-23 NOTE — Telephone Encounter (Signed)
Refill sent to the pharmacy for the patient 

## 2019-09-01 ENCOUNTER — Telehealth: Payer: Self-pay

## 2019-09-01 NOTE — Telephone Encounter (Signed)
Patient's sister called stating that patient is having some blood in her urine and noted some in her underwear. She denies fever, chills, nausea, or vomiting. It was explained that this is normal with having a stent in place and should resolve when the stent is removed. Patient's sister verbalized understanding.

## 2019-09-06 ENCOUNTER — Ambulatory Visit: Payer: PPO | Admitting: Family Medicine

## 2019-09-06 ENCOUNTER — Encounter: Payer: Self-pay | Admitting: Family Medicine

## 2019-09-06 ENCOUNTER — Other Ambulatory Visit: Payer: Self-pay

## 2019-09-06 VITALS — BP 123/83 | HR 110 | Ht 63.0 in | Wt 212.0 lb

## 2019-09-06 DIAGNOSIS — C37 Malignant neoplasm of thymus: Secondary | ICD-10-CM | POA: Diagnosis not present

## 2019-09-06 DIAGNOSIS — Z8669 Personal history of other diseases of the nervous system and sense organs: Secondary | ICD-10-CM | POA: Diagnosis not present

## 2019-09-06 DIAGNOSIS — G7000 Myasthenia gravis without (acute) exacerbation: Secondary | ICD-10-CM

## 2019-09-06 DIAGNOSIS — C642 Malignant neoplasm of left kidney, except renal pelvis: Secondary | ICD-10-CM | POA: Diagnosis not present

## 2019-09-06 NOTE — Progress Notes (Signed)
PATIENT: Madison Burns DOB: 1950/10/26  REASON FOR VISIT: follow up HISTORY FROM: patient  Chief Complaint  Patient presents with  . Follow-up    rm 2 here for a f/u. pt is with her sister. Pt has had kidney Cancer this year.     HISTORY OF PRESENT ILLNESS: Today 09/08/19 Madison Burns is a 69 y.o. female here today for follow up for MG. She continues mestinon TID. Methotrexate discontinued when she started chemo. She feels that symptoms are stable. Her sister has noted once or twice where her left eye will seem to be "droopy" at night when she is tired. She did have several episodes where she got strangled during chemo therapy. She sometimes will take larger bites of food and will eat fast. Her sister is helping to monitor this. Renal cell- tumor was "frozen" with urology. Thymoma- had chemo and radiation. Has a PET scan tomorrow. She seems more interactive since. She is able to answer more complicated questions. No falls. No vision changes/double vision. She has had an occasional headache, aborted with tylenol. She has had mild tingling of her feet following chemo but does not feel it is consistent or worsening. Sleep apnea well managed on CPAP. Has not had a seizure in 9 years, not on AED.   HISTORY: (copied from Dr Edwena Felty note on 03/09/2019)  Madison Burns is a 69 y.o. female with mental retardation, living with her sister. Madison Burns has been on medication for myasthenia gravis, she had lost some weight since eating mostly at home during the pandemic.  She still has a good appetite no trouble swallowing she had no falls and no changes in gait.  No taking any seizure medicine, she has continued Mestinon and methotrexate for myasthenia gravis and it was felt that she was fairly stable.   Then she learned that she was diagnosed with a mediastinal tumor that is close to the upper aortic arch,  In addition to the recent discovery of , what may be a thymoma,  she had a history of  melanoma which was removed in 2014 from the right side of the neck close to the TM joint, and she now has a spot on her kidney presumed to be malignant. This would be her third tumor.   IMPRESSION: 1. Apparent subtle 3.2 cm homogeneously hypoenhancing lesion in the posterior interpolar left kidney. Imaging features raise concern for papillary type renal cell carcinoma. Abdominal MRI with and without contrast recommended to further evaluate. 2. Bilateral adrenal nodules. These could also be further assessed at the time of follow-up MRI. 3. No specific findings to explain the patient's history of periumbilical pain. 4. Tiny gas bubble identified in the urinary bladder, presumably from recent instrumentation. In the absence of instrumentation, infection would be a consideration. 5. Insert [name] 6. Left colonic diverticulosis without diverticulitis.  She has been followed here by me for OSA- for obstructive sleep apnea and has been a very compliant CPAP user V visit for sleep apnea is not to do until September 2021 with Madison Burns.    Madison Burns, 11-03-2018 here today for follow up of MG, seizure, and OSA on CPAP. She is here today with her sister , Madison Burns who aids in history. She continues mestinon and methotrexate for MG and feels symptoms are fairly stable. No vision changes, no double vision. No new or worsening weakness. She has lost some weight as they are eating at home more since pandemic. She has a good  appetite. No trouble swallowing. No changes in gait or falls. She is able to perform ADL's. She lives with her sister. She has not taken seizure medication in many years with no seizure activity noted. No adverse effects or worsening symptoms with decreased dose of methotrexate.   Compliance report dated 10/03/2018 through 11/01/2018 reveals that she is using CPAP nightly for compliance 100%.  She is using CPAP greater than 4 hours every night.  Average usage is 9 hours and 57 minutes.   AHI is 1.9 on 5 cm of water.  There is no significant leak.  HISTORY: (copied from Madison Burns note on 01/05/2018)  HISTORY Madison Burns a 69 y.o.femaleseen here as a referral from Dr. Essie Hart Myasthenia gravis, per special request of the patient's sister .  [name] carries a diagnosis of myasthenia gravis associated with difficulties swallowing, with a decreased level of energy a high degree of fatigue sometimes best interest in activities facial droop eyelid droop sleepiness snoring easy bruising, coughing, trouble swallowing she has a mild hoarseness but not as significant dysphonia and incontinence.  Her family and the patient looking for a maintenance neurologist to keep her myasthenia under control. The patient has been diagnosed and treated by Dr Carlis Abbott in [place]- until his death.She has been followed by Dr. Melrose Nakayama at the [facility] after following Dr. Paulla Dolly for 18 month .  The patient was diagnosed in 2011 by dr Carlis Abbott-  with myasthenia gravis. She was hospitalized with myasthnic crisis Jan 27 2010- through Januery 14th 2012. She was send to rehabilitation after that, by 03-2010. [name]  Her sister noticed a decline in cognitive skills at that time.  She has noted some core weakness- MG , no ptosis, no double vision.    She   is sleeping 12 hours easily per day, from 10 Pm to 8 AM. She has a droopy right eye.  She missed frequently the 3rd. Mestinon.  She has been followed by Dr. Melrose Nakayama at the [facility] clinic after following Dr. Paulla Dolly . Again, her first treatment had been prednisone and this was just weaned off by September 2015. The patient carries a diagnosis of hypercholesterolemia. She also has a past surgical history of appendectomy in 1999, hysterectomy in 2004 and the melanoma was removed in 2014 from the right side of the neck close to the TM joint.  Ms. Ruhe is single without children, Lives with her sister now used to live with her mother.  She is MRDD, she left school in 7th grade, went to a special facility until her GED, [employer]. She used to work at a [employer] ,later in a vocational trade.   Ms. Shimada is taking methotrexate to control them myasthenia she is also on Mestinon 60 mg 3 times a day she takes Detrol for urinary continence at 4 mg in the morning and she takes Risperdal 2 mg I mouth at bedtime. She is also treated for hypothyroidism with levothyroxine at 50 g daily. The methotrexate is given at 8 tablets once a week. Until last September 2015 the patient had been on prednisone. She has not had labs drawn for the last 5 month, while continuing on MTX.  4/19/17Today's visit is solely dedicated to the sleep disorder part of her neurological care Mrs. Fear underwent a sleep apnea test as a home sleep test, the AHI was 20.7 the lowest desaturation was 61 with 309 minutes of desaturation. This of course a possibility that in a home sleep test the  hypoxemia is overestimated or artifact related but she does have a significant amount of obstructive sleep apnea as well. For this reason and because of her underlying condition of myasthenia gravis the patient was asked to undergo an attended CPAP titration. This took place on 04-19-14 the AHI was 0.3 on CPAP of only 5 cm water. Her SPO2 nadir rose to 91% no added oxygen was needed. I prescribed a ResMed air-fluid P 10 in medium size and an auto CPAP from 4 through 8 cm water. The patient states that she has not received CPAP machine that she had an outstanding bill was advanced home care which may be the reason.  She is on Medicare/ Medicaid and a sleep study cannot be older than 6 months to allow her to obtain a machine - so I will have to repeat her sleep study. Her Epworth sleepiness score remains high at 17 points, her depression score at 3.5, fatigue severity questionnaire at 38 points. MRDD - needs assistence with CPAP, certainly would need a desensitization session with [facility] in  [place].    UPDATE 10/19/2017CMMs. Oak, 69 year old female returns for follow-up with her mother. She has a history of obstructive sleep apnea on CPAP compliance report today is 100% for 30 days average usage 10 hours 6 minutes AHI is 1.1. No leaks. In addition she has myasthenia gravis and is currently on Mestinon and methotrexate. She needs refills. No recent falls. She has no facial weakness no trouble swallowing. She also has a history of seizure disorder but has not had seizures in many years and has been weaned off of her seizure medication. She returns for reevaluation  02/23/17 CDI have the pleasure of following Ms. Murgia for her obstructive sleep apnea,and she has again proven to be a highly compliant CPAP user. Her CPAP compliance is 93% with 9 hours and 9 minutes on average daily use. CPAP is set at only 5 cm water pressure and allows for reduction of the AHI to 0.6. No central apneas are emerging and she has very few air leaks. Her baseline AHI in 02-2015 was 20.8/hr."She reports dreaming more- good dreams". She sleeps well. She has trouble with swallowing solid foods. In addition she has not had significant muscle weakness, no falls, but she does feel easily fatigued and sleepy. She endorsed the Epworth score today on 11 and the fatigue severity at 54 points. 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 She often cried during this task.   UPDATE11/19/2019CMMs.Colston,69 year old female returns for follow-up with history of obstructive sleep apnea on CPAP. She also has a history of myasthenia gravis and seizure disorder.She is no longer on seizure medication and no seizures in many years. CPAP compliance dated 12/05/2017-01/03/2018 shows compliance greater than 4 hours at 100%. Set pressure 5 cm. Average usage 10 hours 29 minutes. No leak. AHI 1.3. She remains on Mestinon and methotrexate for her myasthenia gravis which has been  stable. She complains of intermittent ptosis of the right eye when fatigued. This is not evident today. She has no double vision. Appetite is good and she sleeps well she returns for reevaluation    REVIEW OF SYSTEMS: Out of a complete 14 system review of symptoms, the patient complains only of the following symptoms, headaches, tingling of feet and all other reviewed systems are negative.  ALLERGIES: No Known Allergies  HOME MEDICATIONS: Outpatient Medications Prior to Visit  Medication Sig Dispense Refill  . Ascorbic Acid (VITAMIN C)  1000 MG tablet Take 1,000 mg by mouth at bedtime.    . B Complex-C (B-COMPLEX WITH VITAMIN C) tablet Take 1 tablet by mouth every evening.    . benzonatate (TESSALON) 100 MG capsule Take 1 capsule (100 mg total) by mouth in the morning and at bedtime. MORNING & 1430 90 capsule 1  . Biotin 10000 MCG TABS Take 10,000 mcg by mouth daily at 2 PM. (1430)    . Calcium-Phosphorus-Vitamin D (CITRACAL +D3 PO) Take 1 tablet by mouth daily.    . folic acid (FOLVITE) 1 MG tablet Take 2 tablets (2 mg total) by mouth daily. 180 tablet 3  . Garlic 9242 MG CAPS Take 1,000 mg by mouth in the morning, at noon, and at bedtime. (0800, 1430, 2000)    . HYDROcodone-acetaminophen (NORCO/VICODIN) 5-325 MG tablet Take 1 tablet by mouth every 6 (six) hours as needed for severe pain. 10 tablet 0  . levothyroxine (SYNTHROID) 75 MCG tablet TAKE 1 TABLET BY MOUTH DAILY ON AN EMPTYSTOMACH. WAIT 30 MINUTES BEFORE TAKING OTHER MEDS (Patient taking differently: Take 75 mcg by mouth daily before breakfast. ) 90 tablet 1  . pantoprazole (PROTONIX) 40 MG tablet Take 40 mg by mouth daily.     . Probiotic Product (PROBIOTIC MULTI-ENZYME PO) Take 1 capsule by mouth in the morning and at bedtime. Probiotic Multi-Enzyme Digestive Formula    . pyridostigmine (MESTINON) 60 MG tablet TAKE 1 TABLET BY MOUTH AT  8 AM, ONE AT LUNCH AND ONE AT 8PM 90 tablet 6  . risperiDONE (RISPERDAL) 2 MG tablet Take 2  mg by mouth at bedtime.     . sucralfate (CARAFATE) 1 g tablet Take 1 g by mouth 2 (two) times daily. (0800 & 1700)    . tolterodine (DETROL LA) 4 MG 24 hr capsule TAKE 1 CAPSULE BY MOUTH ONCE EVERY MORNING (Patient taking differently: Take 4 mg by mouth daily. ) 90 capsule 1  . vitamin E 180 MG (400 UNITS) capsule Take 400 Units by mouth daily at 2 PM. 1430     No facility-administered medications prior to visit.    PAST MEDICAL HISTORY: Past Medical History:  Diagnosis Date  . Cancer of kidney ([place]) 2021  . Complication of anesthesia    Myasthenia gravis  . GERD (gastroesophageal reflux disease)   . History of seizure disorder   . Hypercholesterolemia   . Hypertension    no longer has it. wt loss  . Hypothyroidism   . Irritable bowel syndrome   . Myasthenia gravis ([place]) 2011  . OSA on CPAP   . Skin cancer 2017   Melanoma on neck    PAST SURGICAL HISTORY: Past Surgical History:  Procedure Laterality Date  . ABDOMINAL HYSTERECTOMY  1990  . APPENDECTOMY  1999  . CHOLECYSTECTOMY  2013  . COLONOSCOPY WITH PROPOFOL N/A 06/30/2017   Procedure: COLONOSCOPY WITH PROPOFOL;  Surgeon: Toledo, Benay Pike, MD;  Location: [facility] ENDOSCOPY;  Service: Gastroenterology;  Laterality: N/A;  . CYSTOSCOPY W/ URETERAL STENT PLACEMENT Left 08/17/2019   Procedure: CYSTOSCOPY WITH RETROGRADE PYELOGRAM/URETERAL STENT PLACEMENT;  Surgeon: Alexis Frock, MD;  Location: [facility];  Service: Urology;  Laterality: Left;  30 MINS  . ESOPHAGOGASTRODUODENOSCOPY (EGD) WITH PROPOFOL N/A 06/30/2017   Procedure: ESOPHAGOGASTRODUODENOSCOPY (EGD) WITH PROPOFOL;  Surgeon: Toledo, Benay Pike, MD;  Location: [facility] ENDOSCOPY;  Service: Gastroenterology;  Laterality: N/A;  . IR RADIOLOGIST EVAL & MGMT  06/28/2019  . RADIOFREQUENCY ABLATION Left 08/17/2019   Procedure: LEFT RENAL CRYO ABLATION;  Surgeon: Jacqulynn Cadet, MD;  Location: [facility];  Service: Anesthesiology;  Laterality: Left;  . TONSILLECTOMY  1959    FAMILY  HISTORY: Family History  Problem Relation Age of Onset  . Colon cancer Maternal Grandfather        stomach/colon  . Heart disease Father        myocardial infarction  . Hyperlipidemia Father   . Hyperlipidemia Sister   . Diabetes Sister   . Bone cancer Other        cousin  . Alzheimer's disease Mother        maternal aunts  . Skin cancer Mother   . Myasthenia gravis Brother        ocular  . Skin cancer Brother   . Stroke Paternal Grandfather   . Breast cancer Neg Hx     SOCIAL HISTORY: Social History   Socioeconomic History  . Marital status: Single    Spouse name: Not on file  . Number of children: 0  . Years of education: Special Ed  . Highest education level: Not on file  Occupational History  . Occupation: Unemployed  Tobacco Use  . Smoking status: Never Smoker  . Smokeless tobacco: Never Used  Vaping Use  . Vaping Use: Never used  Substance and Sexual Activity  . Alcohol use: No    Alcohol/week: 0.0 standard drinks  . Drug use: No  . Sexual activity: Not Currently  Other Topics Concern  . Not on file  Social History Narrative   06/23/17 lives with sister, Madison Burns   Regular exercise-no   Caffeine Use-yes   Social Determinants of Health   Financial Resource Strain:   . Difficulty of Paying Living Expenses:   Food Insecurity:   . Worried About Charity fundraiser in the Last Year:   . Arboriculturist in the Last Year:   Transportation Needs:   . Film/video editor (Medical):   [name] Lack of Transportation (Non-Medical):   Physical Activity:   . Days of Exercise per Week:   . Minutes of Exercise per Session:   Stress:   . Feeling of Stress :   Social Connections:   . Frequency of Communication with Friends and Family:   . Frequency of Social Gatherings with Friends and Family:   . Attends Religious Services:   . Active Member of Clubs or Organizations:   . Attends Archivist Meetings:   [name] Marital Status:   Intimate Partner Violence:   .  Fear of Current or Ex-Partner:   . Emotionally Abused:   [name] Physically Abused:   . Sexually Abused:       PHYSICAL EXAM  Vitals:   09/06/19 1322  BP: 123/83  Pulse: (!) 110  Weight: 212 lb (96.2 kg)  Height: 5\' 3"  (1.6 m)   Body mass index is 37.55 kg/m.  Generalized: Well developed, in no acute distress  Cardiology: normal rate and rhythm, no murmur noted Respiratory: clear to auscultation bilaterally  Neurological examination  Mentation: Alert, not fully oriented but able to assist with some history taking, developmentally delayed.  Cranial nerve II-XII: Pupils were equal round reactive to light. Extraocular movements were full, visual field were full on confrontational test. Facial sensation and strength were normal. Uvula tongue midline. Head turning and shoulder shrug  were normal and symmetric. Motor: The motor testing reveals 5 over 5 strength of all 4 extremities. Good symmetric motor tone is noted throughout.  Gait and station: Gait is normal.  Stooped posture, tandem not attempted.    DIAGNOSTIC DATA (LABS, IMAGING, TESTING) - I reviewed patient records, labs, notes, testing and imaging myself where available.  No flowsheet data found.   Lab Results  Component Value Date   WBC 9.0 09/07/2019   HGB 12.5 09/07/2019   HCT 38.0 09/07/2019   MCV 96.0 09/07/2019   PLT 173 09/07/2019      Component Value Date/Time   NA 139 09/07/2019 0920   NA 144 04/04/2015 1600   NA 137 02/21/2012 1828   K 4.0 09/07/2019 0920   K 4.3 02/21/2012 1828   CL 107 09/07/2019 0920   CL 104 02/21/2012 1828   CO2 22 09/07/2019 0920   CO2 25 02/21/2012 1828   GLUCOSE 126 (H) 09/07/2019 0920   GLUCOSE 145 (H) 02/21/2012 1828   BUN 19 09/07/2019 0920   BUN 8 04/04/2015 1600   BUN 8 02/21/2012 1828   CREATININE 0.92 09/07/2019 0920   CREATININE 1.14 02/21/2012 1828   CALCIUM 8.3 (L) 09/07/2019 0920   CALCIUM 9.0 02/21/2012 1828   PROT 6.7 09/07/2019 0920   PROT 6.0 04/04/2015  1600   PROT 6.4 04/25/2011 0854   ALBUMIN 3.5 09/07/2019 0920   ALBUMIN 4.0 04/04/2015 1600   ALBUMIN 3.4 04/25/2011 0854   AST 35 09/07/2019 0920   AST 18 04/25/2011 0854   ALT 20 09/07/2019 0920   ALT 25 04/25/2011 0854   ALKPHOS 68 09/07/2019 0920   ALKPHOS 45 (L) 04/25/2011 0854   BILITOT 0.8 09/07/2019 0920   BILITOT 0.4 04/04/2015 1600   BILITOT 0.4 04/25/2011 0854   GFRNONAA >60 09/07/2019 0920   GFRNONAA 52 (L) 02/21/2012 1828   GFRAA >60 09/07/2019 0920   GFRAA >60 02/21/2012 1828   Lab Results  Component Value Date   CHOL 197 12/14/2018   HDL 36.80 (L) 12/14/2018   LDLCALC 110 (H) 08/06/2018   LDLDIRECT 122.0 12/14/2018   TRIG 210.0 (H) 12/14/2018   CHOLHDL 5 12/14/2018   Lab Results  Component Value Date   HGBA1C 5.4 12/14/2018   No results found for: VITAMINB12 Lab Results  Component Value Date   TSH 2.07 03/30/2018       ASSESSMENT AND PLAN 69 y.o. year old female  has a past medical history of Cancer of kidney ([place]) (4782), Complication of anesthesia, GERD (gastroesophageal reflux disease), History of seizure disorder, Hypercholesterolemia, Hypertension, Hypothyroidism, Irritable bowel syndrome, Myasthenia gravis ([place]) (2011), OSA on CPAP, and Skin cancer (2017). here with     ICD-10-CM   1. Myasthenia gravis ([place])  G70.00   2. Renal cell cancer, left ([facility])  C64.2   3. Thymoma, malignant ([place])  C37   4. History of seizure disorder  Z36.69     [name] is doing well today. She continues mestinon 60mg  TID and is tolerating well. She has discontinued methotrexate in setting of renal carcinoma. No new or worsening symptoms. We will continue current treatment plan. She has had a few headaches, easily treated with Tylenol. Numbness and tingling of feet is very mild and intermittent. We will monitor at this time but will consider other treatment options for any worsening. Her sister will call us if symptoms seem to be worsening. No recent seizures. She was  encouraged to continue healthy lifestyle habits. She will continue close follow up with her care team. She will follow up with [place] in 6 months, sooner if needed.    No orders of the defined types were placed in this encounter.  No orders of the defined types were placed in this encounter.     I spent 30 minutes with the patient. 50% of this time was spent counseling and educating patient on plan of care and medications.    [name], FNP-C 09/08/2019, 8:34 AM [facility] [address] [place], [place] [address] [phone]

## 2019-09-06 NOTE — Progress Notes (Signed)
° °  09/07/2019  CC:  Chief Complaint  Patient presents with   Cysto Stent Removal    HPI: Madison Burns is a 69 y.o. F with a personal history of developmental delay, myasthenia gravis, malignant invasive thymoma who returns today for a stent removal.   Pt was admitted to [facility] from 08/17/2019 to 08/18/2019 for a left renal mass. Patient had cystoscopy with retrograde ureteral stent placement for renal protection during cryoablation with Dr. Tresa Moore. She was taken to IR for an image-guided left renal mass cryoablation by Dr. Laurence Ferrari. Procedure complicated by perinephric bleeding following procedure. She had mild abdominal pain. Denied flank pain. Her urine was pink tinged, no clots noted. Hemoglobin was stable at 10.2.   NED. A&Ox3.   No respiratory distress   Abd soft, NT, ND Normal external genitalia with patent urethral meatus  Cystoscopy/ Stent removal procedure  Patient identification was confirmed, informed consent was obtained, and patient was prepped using Betadine solution.  Lidocaine jelly was administered per urethral meatus.    Preoperative abx where received prior to procedure.    Procedure: - Flexible cystoscope introduced, without any difficulty.   - Thorough search of the bladder revealed:    normal urethral meatus  Stent seen emanating from left ureteral orifice, grasped with stent graspers, and removed in entirety.     Post-Procedure: - Patient tolerated the procedure well   Assessment/ Plan:  1. Left renal cell carcinoma  Status post cryoablation, has follow-up with Dr. Laurence Ferrari  Stent removed today without difficulty, post stent removal warning/precautions reviewed   Keflex given today at time of stent removal today   Return in 7 months.  Fransico Him, am acting as a scribe for Dr. Hollice Espy.  I have reviewed the above documentation for accuracy and completeness, and I agree with the above.   Hollice Espy, MD

## 2019-09-06 NOTE — Patient Instructions (Addendum)
We will continue Mestinon 60mg  three times daily.   We will continue to monitor headaches and foot tingling. Please continue to monitor swallowing.   Follow up in 6 months   Myasthenia Gravis Myasthenia gravis (MG) is a long-term (chronic) condition that causes weakness in the muscles you can control (voluntary muscles). MG can affect any voluntary muscle. The muscles most often affected are the ones that control:  Eye movement.  Facial movements.  Swallowing. MG is a disease in which the body's disease-fighting system (immune system) attacks its own healthy tissues (autoimmune disease). When you have MG, your immune system makes proteins (antibodies) that block the chemical (acetylcholine) that your body needs to send nerve signals to your muscles. This causes muscle weakness. What are the causes? The exact cause of MG is not known. What increases the risk? The following factors may make you more likely to develop this condition:  Having an enlarged thymus gland. The thymus gland is located under the breastbone. It makes certain cells for the immune system.  Having a family history of MG. What are the signs or symptoms? Symptoms of MG may include:  Drooping eyelids.  Double vision.  Muscle weakness that gets worse with activity and gets better after rest.  Difficulty walking.  Trouble chewing and swallowing.  Trouble making facial expressions.  Slurred speech.  Weakness of the arms, hands, and legs. Sudden, severe difficulty breathing (myasthenic crisis) may develop after having:  An infection.  A fever.  A bad reaction to a medicine. Myasthenic crisis requires emergency breathing support. Sometimes symptoms of MG go away for a while (remission) and then come back later. How is this diagnosed? This condition may be diagnosed based on:  Your symptoms and medical history.  A physical exam.  Blood tests.  Tests of your muscle strength and  function.  Imaging tests, such as a CT scan or an MRI. How is this treated? The goal of treatment is to improve muscle strength. Treatment may include:  Taking medicine.  Making lifestyle changes that focus on saving your energy.  Doing physical therapy to gain strength.  Having surgery to remove the thymus gland (thymectomy). This may result in a long remission for some people.  Having a procedure to remove the acetylcholine antibodies (plasmapheresis).  Getting emergency breathing support, if you experience myasthenic crisis. If you experience remission, you may be able to stop treatment and then resume treatment when your symptoms return. Follow these instructions at home:   Take over-the-counter and prescription medicines only as told by your health care provider.  Get plenty of rest and sleep. Take frequent breaks to rest your eyes, especially when in bright light or working on a computer.  Maintain a healthy diet and a healthy weight. Work with your health care provider or a diet and nutrition specialist (dietitian) if you need help.  Do exercises as told by your health care provider or physical therapist.  Do not use any products that contain nicotine or tobacco, such as cigarettes and e-cigarettes. If you need help quitting, ask your health care provider.  Prevent infections by: ? Washing your hands often with soap and water. If soap and water are not available, use hand sanitizer. ? Avoiding contact with other people who are sick. ? Avoiding touching your eyes, nose, and mouth. ? Cleaning surfaces in your home that are touched often using a disinfectant.  Keep all follow-up visits as told by your health care provider. This is important. Contact a health  care provider if:  Your symptoms change or get worse, especially after having a fever or infection. Get help right away if:  You have trouble breathing. Summary  Myasthenia gravis (MG) is a long-term (chronic)  condition that causes weakness in the muscles you can control (voluntary muscles).  A symptom of MG is muscle weakness that gets worse with activity and gets better after rest.  Sudden, severe difficulty breathing (myasthenic crisis) may develop after having an infection, a fever, or a bad reaction to a medicine.  The goal of treatment is to improve muscle strength. Treatment may include medicines, lifestyle changes, physical therapy, surgery, plasmapheresis, or emergency breathing support. This information is not intended to replace advice given to you by your health care provider. Make sure you discuss any questions you have with your health care provider. Document Revised: 02/16/2017 Document Reviewed: 02/16/2017 Elsevier Patient Education  2020 [employer].

## 2019-09-07 ENCOUNTER — Encounter: Payer: Self-pay | Admitting: Oncology

## 2019-09-07 ENCOUNTER — Other Ambulatory Visit: Payer: Self-pay

## 2019-09-07 ENCOUNTER — Inpatient Hospital Stay: Payer: PPO | Attending: Oncology

## 2019-09-07 ENCOUNTER — Inpatient Hospital Stay: Payer: PPO | Admitting: Oncology

## 2019-09-07 ENCOUNTER — Ambulatory Visit (INDEPENDENT_AMBULATORY_CARE_PROVIDER_SITE_OTHER): Payer: PPO | Admitting: Urology

## 2019-09-07 DIAGNOSIS — N2889 Other specified disorders of kidney and ureter: Secondary | ICD-10-CM | POA: Diagnosis not present

## 2019-09-07 DIAGNOSIS — I10 Essential (primary) hypertension: Secondary | ICD-10-CM | POA: Diagnosis not present

## 2019-09-07 DIAGNOSIS — D7212 Drug rash with eosinophilia and systemic symptoms syndrome: Secondary | ICD-10-CM | POA: Diagnosis not present

## 2019-09-07 DIAGNOSIS — E039 Hypothyroidism, unspecified: Secondary | ICD-10-CM | POA: Diagnosis not present

## 2019-09-07 DIAGNOSIS — G7000 Myasthenia gravis without (acute) exacerbation: Secondary | ICD-10-CM | POA: Insufficient documentation

## 2019-09-07 DIAGNOSIS — D4989 Neoplasm of unspecified behavior of other specified sites: Secondary | ICD-10-CM | POA: Diagnosis not present

## 2019-09-07 DIAGNOSIS — G40909 Epilepsy, unspecified, not intractable, without status epilepticus: Secondary | ICD-10-CM | POA: Diagnosis not present

## 2019-09-07 DIAGNOSIS — F79 Unspecified intellectual disabilities: Secondary | ICD-10-CM | POA: Diagnosis not present

## 2019-09-07 DIAGNOSIS — K589 Irritable bowel syndrome without diarrhea: Secondary | ICD-10-CM | POA: Diagnosis not present

## 2019-09-07 DIAGNOSIS — Z79899 Other long term (current) drug therapy: Secondary | ICD-10-CM | POA: Insufficient documentation

## 2019-09-07 DIAGNOSIS — C642 Malignant neoplasm of left kidney, except renal pelvis: Secondary | ICD-10-CM | POA: Insufficient documentation

## 2019-09-07 DIAGNOSIS — K219 Gastro-esophageal reflux disease without esophagitis: Secondary | ICD-10-CM | POA: Diagnosis not present

## 2019-09-07 DIAGNOSIS — C37 Malignant neoplasm of thymus: Secondary | ICD-10-CM | POA: Insufficient documentation

## 2019-09-07 DIAGNOSIS — E7800 Pure hypercholesterolemia, unspecified: Secondary | ICD-10-CM | POA: Diagnosis not present

## 2019-09-07 LAB — COMPREHENSIVE METABOLIC PANEL
ALT: 20 U/L (ref 0–44)
AST: 35 U/L (ref 15–41)
Albumin: 3.5 g/dL (ref 3.5–5.0)
Alkaline Phosphatase: 68 U/L (ref 38–126)
Anion gap: 10 (ref 5–15)
BUN: 19 mg/dL (ref 8–23)
CO2: 22 mmol/L (ref 22–32)
Calcium: 8.3 mg/dL — ABNORMAL LOW (ref 8.9–10.3)
Chloride: 107 mmol/L (ref 98–111)
Creatinine, Ser: 0.92 mg/dL (ref 0.44–1.00)
GFR calc Af Amer: >60 mL/min (ref >60)
GFR calc non Af Amer: >60 mL/min (ref >60)
Glucose, Bld: 126 mg/dL — ABNORMAL HIGH (ref 70–99)
Potassium: 4 mmol/L (ref 3.5–5.1)
Sodium: 139 mmol/L (ref 135–145)
Total Bilirubin: 0.8 mg/dL (ref 0.3–1.2)
Total Protein: 6.7 g/dL (ref 6.5–8.1)

## 2019-09-07 LAB — CBC WITH DIFFERENTIAL/PLATELET
Abs Immature Granulocytes: 0.05 10*3/uL (ref 0.00–0.07)
Basophils Absolute: 0 10*3/uL (ref 0.0–0.1)
Basophils Relative: 0 %
Eosinophils Absolute: 0.1 10*3/uL (ref 0.0–0.5)
Eosinophils Relative: 1 %
HCT: 38 % (ref 36.0–46.0)
Hemoglobin: 12.5 g/dL (ref 12.0–15.0)
Immature Granulocytes: 1 %
Lymphocytes Relative: 10 %
Lymphs Abs: 0.9 10*3/uL (ref 0.7–4.0)
MCH: 31.6 pg (ref 26.0–34.0)
MCHC: 32.9 g/dL (ref 30.0–36.0)
MCV: 96 fL (ref 80.0–100.0)
Monocytes Absolute: 0.6 10*3/uL (ref 0.1–1.0)
Monocytes Relative: 7 %
Neutro Abs: 7.4 10*3/uL (ref 1.7–7.7)
Neutrophils Relative %: 81 %
Platelets: 173 10*3/uL (ref 150–400)
RBC: 3.96 MIL/uL (ref 3.87–5.11)
RDW: 14.6 % (ref 11.5–15.5)
WBC: 9 10*3/uL (ref 4.0–10.5)
nRBC: 0 % (ref 0.0–0.2)

## 2019-09-07 MED ORDER — DULOXETINE HCL 30 MG PO CPEP
30.0000 mg | ORAL_CAPSULE | Freq: Every day | ORAL | 5 refills | Status: DC
Start: 2019-09-07 — End: 2020-03-09

## 2019-09-07 MED ORDER — CEPHALEXIN 250 MG PO CAPS
500.0000 mg | ORAL_CAPSULE | Freq: Once | ORAL | Status: AC
  Administered 2019-09-07: 500 mg via ORAL

## 2019-09-07 NOTE — Progress Notes (Signed)
In and Out Catheterization  Patient is present today for a I & O catheterization prior to stent removal for urine sample. Patient was cleaned and prepped in a sterile fashion with betadine . A 14FR cath was inserted no complications were noted , 74ml of urine return was noted, urine was bloody in color. A clean urine sample was collected for UA. Bladder was drained  And catheter was removed with out difficulty.    Preformed by: Fonnie Jarvis, CMA

## 2019-09-07 NOTE — Progress Notes (Signed)
Patient sister reports that she does have occasional leg pain, described as sharp, that requires her to take the Hydrocodone prn.

## 2019-09-07 NOTE — Progress Notes (Signed)
Hematology/Oncology follow up  note [facility] [phone] [phone]   Patient Care Team: Einar Pheasant, MD as PCP - General (Internal Medicine) Telford Nab, RN as Oncology Nurse Navigator  REFERRING PROVIDER: Einar Pheasant, MD  CHIEF COMPLAINTS/REASON FOR VISIT:  Follow-up for thymoma  HISTORY OF PRESENTING ILLNESS:   Madison Burns is a  69 y.o.  female with PMH listed below was seen in consultation at the request of  Einar Pheasant, MD  for evaluation of thymoma.  Patient was accompanied by her sister today.  History was obtained from her sister.  Patient has intellectual disability Patient initially underwent a CT abdomen pelvis with contrast on 02/07/2019 for worsening umbilical pain.  It reviewed incidental 3 cm renal mass on the left.  Subsequent MRCP 02/21/2019 performed for the purpose of right upper quadrant pain as well as renal mass in question.  MRI 02/20/2018 showed 3.2 x 2.6 left lower pole endophytic renal mass, consistent with possible renal cell carcinoma.  No signs of metastatic disease in abdomen.  Adrenal lesion more suggestive adenomas.  There was an incidental finding of lobular area along the anterior mediastinum, questionably e mediastinal mass.  CT scan 02/07/2019 showed lobulated internally calcified mass of the anterior mediastinum which effaces the central portion of the left brachiocephalic vein and a very closely abuts the tubular ascending aorta, 5.8 x 4.9 x 4.1 cm.  Adjacent pleural nodularity about the right upper lobe. 3 mm pulmonary nodule of the right upper lobe, hepatic steatosis.  #Patient has a chronic history of myasthenia gravis, initially diagnosed in 2011.  Patient had been treated with prednisone for prolonged period of time.  Recently she has been off prednisone and is currently taking methotrexate and pyridostigmine for symptoms.  Per sister, patient does not complain much of weakness.  At her  baseline.  She is able to feed and dress herself.  She needs some assistance from her sister for bathing.  She likes to watch TV.  Patient has been seen by Dr. Genevive Bi and Dr. Erlene Quan.  Patient's sister would like to entertain the diagnosis and potential therapy. Patient underwent CT-guided needle biopsy of the anterior mediastinal mass, as well as biopsy of the kidney mass Anterior mediastinal mass biopsy pathology showed thymoma, predominantly B2 patent with subsets of a B3 pattern. Left kidney mass showed renal cell carcinoma,tumor cannot be reliably subtyped based on this limited sample and the IHC profile  She has a history of melanoma on her neck in 2017.  Sister is POA. Patient lives with sister  #Cancer treatment 04/27/2019-05/18/2019 patient status post 2 cycles of dose reduced carboplatin and etoposide treatment concurrently with radiation. April 2021, patient  finished radiation treatments..  INTERVAL HISTORY Madison Burns is a 69 y.o. female who has above history reviewed by me today presents for follow up visit for management of malignant thymoma, left renal cell carcinoma Problems and complaints are listed below: Patient was accompanied by sister.  Due to development delay, patient cannot provide much history.  History was obtained from sister.  Per sister, patient has been doing very well.  She has good appetite and her mental status has even improved Patient complained bilateral lower extremity aches for the past few weeks.  Patient is not able to provide further details about the aches.  08/17/2019-08/18/2019 Left  RCC, patient was admitted.  Patient underwent cystoscopy with Dr. Tammi Klippel with retrograde ureteral stent placement for renal protection during cryoablation.  The patient was taken to  IR for an image guided left renal mass cryoablation by Dr. Laurence Ferrari.  Procedure was complicated by perinephric bleeding following the procedure.  She was monitored closely and remained  hemodynamically stable.  Review of Systems  Unable to perform ROS: Other (Intellectual Disability)  Gastrointestinal: Negative for diarrhea and nausea.  Genitourinary: Negative for dysuria and frequency.   Musculoskeletal:       Bilateral lower extremity aches    MEDICAL HISTORY:  Past Medical History:  Diagnosis Date  . Cancer of kidney ([place]) 2021  . Complication of anesthesia    Myasthenia gravis  . GERD (gastroesophageal reflux disease)   . History of seizure disorder   . Hypercholesterolemia   . Hypertension    no longer has it. wt loss  . Hypothyroidism   . Irritable bowel syndrome   . Myasthenia gravis ([place]) 2011  . OSA on CPAP   . Skin cancer 2017   Melanoma on neck    SURGICAL HISTORY: Past Surgical History:  Procedure Laterality Date  . ABDOMINAL HYSTERECTOMY  1990  . APPENDECTOMY  1999  . CHOLECYSTECTOMY  2013  . COLONOSCOPY WITH PROPOFOL N/A 06/30/2017   Procedure: COLONOSCOPY WITH PROPOFOL;  Surgeon: Toledo, Benay Pike, MD;  Location: [facility] ENDOSCOPY;  Service: Gastroenterology;  Laterality: N/A;  . CYSTOSCOPY W/ URETERAL STENT PLACEMENT Left 08/17/2019   Procedure: CYSTOSCOPY WITH RETROGRADE PYELOGRAM/URETERAL STENT PLACEMENT;  Surgeon: Alexis Frock, MD;  Location: [facility];  Service: Urology;  Laterality: Left;  30 MINS  . ESOPHAGOGASTRODUODENOSCOPY (EGD) WITH PROPOFOL N/A 06/30/2017   Procedure: ESOPHAGOGASTRODUODENOSCOPY (EGD) WITH PROPOFOL;  Surgeon: Toledo, Benay Pike, MD;  Location: [facility] ENDOSCOPY;  Service: Gastroenterology;  Laterality: N/A;  . IR RADIOLOGIST EVAL & MGMT  06/28/2019  . RADIOFREQUENCY ABLATION Left 08/17/2019   Procedure: LEFT RENAL CRYO ABLATION;  Surgeon: Jacqulynn Cadet, MD;  Location: [facility];  Service: Anesthesiology;  Laterality: Left;  . TONSILLECTOMY  1959    SOCIAL HISTORY: Social History   Socioeconomic History  . Marital status: Single    Spouse name: Not on file  . Number of children: 0  . Years of education: Special Ed   . Highest education level: Not on file  Occupational History  . Occupation: Unemployed  Tobacco Use  . Smoking status: Never Smoker  . Smokeless tobacco: Never Used  Vaping Use  . Vaping Use: Never used  Substance and Sexual Activity  . Alcohol use: No    Alcohol/week: 0.0 standard drinks  . Drug use: No  . Sexual activity: Not Currently  Other Topics Concern  . Not on file  Social History Narrative   06/23/17 lives with sister, [name]   Regular exercise-no   Caffeine Use-yes   Social Determinants of Health   Financial Resource Strain:   . Difficulty of Paying Living Expenses:   Food Insecurity:   . Worried About Charity fundraiser in the Last Year:   . Arboriculturist in the Last Year:   Transportation Needs:   . Film/video editor (Medical):   [name] Lack of Transportation (Non-Medical):   Physical Activity:   . Days of Exercise per Week:   . Minutes of Exercise per Session:   Stress:   . Feeling of Stress :   Social Connections:   . Frequency of Communication with Friends and Family:   . Frequency of Social Gatherings with Friends and Family:   . Attends Religious Services:   . Active Member of Clubs or Organizations:   . Attends  Club or Organization Meetings:   [name] Marital Status:   Intimate Partner Violence:   . Fear of Current or Ex-Partner:   . Emotionally Abused:   [name] Physically Abused:   . Sexually Abused:     FAMILY HISTORY: Family History  Problem Relation Age of Onset  . Colon cancer Maternal Grandfather        stomach/colon  . Heart disease Father        myocardial infarction  . Hyperlipidemia Father   . Hyperlipidemia Sister   . Diabetes Sister   . Bone cancer Other        cousin  . Alzheimer's disease Mother        maternal aunts  . Skin cancer Mother   . Myasthenia gravis Brother        ocular  . Skin cancer Brother   . Stroke Paternal Grandfather   . Breast cancer Neg Hx     ALLERGIES:  has No Known Allergies.  MEDICATIONS:   Current Outpatient Medications  Medication Sig Dispense Refill  . Ascorbic Acid (VITAMIN C) 1000 MG tablet Take 1,000 mg by mouth at bedtime.    . B Complex-C (B-COMPLEX WITH VITAMIN C) tablet Take 1 tablet by mouth every evening.    . benzonatate (TESSALON) 100 MG capsule Take 1 capsule (100 mg total) by mouth in the morning and at bedtime. MORNING & 1430 90 capsule 1  . Biotin 10000 MCG TABS Take 10,000 mcg by mouth daily at 2 PM. (1430)    . Calcium-Phosphorus-Vitamin D (CITRACAL +D3 PO) Take 1 tablet by mouth daily.    . folic acid (FOLVITE) 1 MG tablet Take 2 tablets (2 mg total) by mouth daily. 180 tablet 3  . Garlic 3474 MG CAPS Take 1,000 mg by mouth in the morning, at noon, and at bedtime. (0800, 1430, 2000)    . HYDROcodone-acetaminophen (NORCO/VICODIN) 5-325 MG tablet Take 1 tablet by mouth every 6 (six) hours as needed for severe pain. 10 tablet 0  . levothyroxine (SYNTHROID) 75 MCG tablet TAKE 1 TABLET BY MOUTH DAILY ON AN EMPTYSTOMACH. WAIT 30 MINUTES BEFORE TAKING OTHER MEDS (Patient taking differently: Take 75 mcg by mouth daily before breakfast. ) 90 tablet 1  . pantoprazole (PROTONIX) 40 MG tablet Take 40 mg by mouth daily.     . Probiotic Product (PROBIOTIC MULTI-ENZYME PO) Take 1 capsule by mouth in the morning and at bedtime. Probiotic Multi-Enzyme Digestive Formula    . pyridostigmine (MESTINON) 60 MG tablet TAKE 1 TABLET BY MOUTH AT  8 AM, ONE AT LUNCH AND ONE AT 8PM 90 tablet 6  . risperiDONE (RISPERDAL) 2 MG tablet Take 2 mg by mouth at bedtime.     . sucralfate (CARAFATE) 1 g tablet Take 1 g by mouth 2 (two) times daily. (0800 & 1700)    . tolterodine (DETROL LA) 4 MG 24 hr capsule TAKE 1 CAPSULE BY MOUTH ONCE EVERY MORNING (Patient taking differently: Take 4 mg by mouth daily. ) 90 capsule 1  . vitamin E 180 MG (400 UNITS) capsule Take 400 Units by mouth daily at 2 PM. 1430    . DULoxetine (CYMBALTA) 30 MG capsule Take 1 capsule (30 mg total) by mouth daily. 30 capsule  5   No current facility-administered medications for this visit.     PHYSICAL EXAMINATION: ECOG PERFORMANCE STATUS: 1 - Symptomatic but completely ambulatory There were no vitals filed for this visit. There were no vitals filed for this visit.  Physical  Exam Constitutional:      General: She is not in acute distress.    Appearance: She is obese.  HENT:     Head: Normocephalic and atraumatic.  Eyes:     General: No scleral icterus. Cardiovascular:     Rate and Rhythm: Normal rate and regular rhythm.     Heart sounds: Normal heart sounds.  Pulmonary:     Effort: Pulmonary effort is normal. No respiratory distress.     Breath sounds: No wheezing.  Abdominal:     General: Bowel sounds are normal. There is no distension.     Palpations: Abdomen is soft.  Musculoskeletal:        General: No deformity. Normal range of motion.     Cervical back: Normal range of motion and neck supple.  Skin:    General: Skin is warm and dry.     Findings: No erythema or rash.  Neurological:     Mental Status: She is alert. Mental status is at baseline.     Cranial Nerves: No cranial nerve deficit.     Coordination: Coordination normal.     Comments: Oriented in person   Psychiatric:        Mood and Affect: Mood normal.     LABORATORY DATA:  I have reviewed the data as listed Lab Results  Component Value Date   WBC 9.0 09/07/2019   HGB 12.5 09/07/2019   HCT 38.0 09/07/2019   MCV 96.0 09/07/2019   PLT 173 09/07/2019   Recent Labs    12/14/18 0857 01/01/19 1728 06/01/19 1325 06/01/19 1325 06/08/19 1017 06/08/19 1017 08/05/19 1454 08/17/19 0632 09/07/19 0920  NA 141   < > 139   < > 142   < > 140 141 139  K 3.6   < > 3.7   < > 3.5   < > 3.8 3.4* 4.0  CL 105   < > 104   < > 107   < > 107 106 107  CO2 30   < > 26   < > 25   < > 25 23 22   GLUCOSE 120*   < > 98   < > 92   < > 109* 111* 126*  BUN 14   < > 20   < > 14   < > 19 14 19   CREATININE 0.78   < > 0.93   < > 0.87   < >  0.76 0.90 0.92  CALCIUM 9.0   < > 8.8*   < > 8.2*   < > 8.6* 8.5* 8.3*  GFRNONAA  --    < > >60   < > >60   < > >60 >60 >60  GFRAA  --    < > >60   < > >60   < > >60 >60 >60  PROT 6.1   < > 6.6  --  6.4*  --   --   --  6.7  ALBUMIN 4.0   < > 3.9  --  3.7  --   --   --  3.5  AST 20   < > 23  --  25  --   --   --  35  ALT 16   < > 23  --  20  --   --   --  20  ALKPHOS 59   < > 53  --  78  --   --   --  68  BILITOT  0.6   < > 0.7  --  0.3  --   --   --  0.8  BILIDIR 0.1  --   --   --   --   --   --   --   --    < > = values in this interval not displayed.   Iron/TIBC/Ferritin/ %Sat No results found for: IRON, TIBC, FERRITIN, IRONPCTSAT    RADIOGRAPHIC STUDIES: I have personally reviewed the radiological images as listed and agreed with the findings in the report.. CT Abd Wo & W Cm  Result Date: 06/15/2019 CLINICAL DATA:  Evaluate kidney mass EXAM: CT ABDOMEN WITHOUT AND WITH CONTRAST TECHNIQUE: Multidetector CT imaging of the abdomen was performed following the standard protocol before and following the bolus administration of intravenous contrast. CONTRAST:  168mL OMNIPAQUE IOHEXOL 300 MG/ML  SOLN COMPARISON:  MRI 02/21/2019 FINDINGS: Lower chest: No acute abnormality. Hepatobiliary: No focal liver abnormality is seen. Status post cholecystectomy. No biliary dilatation. Pancreas: Unremarkable. No pancreatic ductal dilatation or surrounding inflammatory changes. Spleen: Normal in size without focal abnormality. Adrenals/Urinary Tract: Unchanged appearance of bilateral adrenal nodules which appear low-attenuation in may represent benign adenomas. The arterial phase images are limited secondary to respiratory motion artifact. The solid enhancing lesion involving the inferior pole of left kidney is again noted. This is best seen on the delayed images as an area of relative decreased attenuation measuring 3.0 x 2.8 cm, image 32/6. Stable from previous exam. No new suspicious kidney lesions identified no  hydronephrosis identified bilaterally. Stomach/Bowel: Stomach is within normal limits. No evidence of bowel wall thickening, distention, or inflammatory changes. Vascular/Lymphatic: No significant vascular findings are present. No enlarged abdominal or pelvic lymph nodes. Other: No free fluid or fluid collections Musculoskeletal: Scoliosis and degenerative disc disease identified IMPRESSION: 1. Stable appearance of solid enhancing lesion involving the inferior pole of left kidney compatible with renal cell carcinoma. No evidence for nodal metastasis or solid organ metastasis. 2. Unchanged bilateral adrenal nodules which may represent benign adenomas. Electronically Signed   By: Kerby Moors M.D.   On: 06/15/2019 13:37   CT GUIDE TISSUE ABLATION  Result Date: 08/17/2019 INDICATION: 69 year old female with developmental delay, malignant invasive thymoma, myasthenia gravis and left-sided renal cell carcinoma. She is not considered an optimal surgical candidate for nephrectomy given her history of myasthenia gravis. She presents today following placement of a left-sided double-J ureteral stent for percutaneous cryoablation. EXAM: Percutaneous cryoablation of left renal mass COMPARISON:  CT abdomen/pelvis 06/15/2019 MEDICATIONS: 2 g Ancef; The antibiotic was administered in an appropriate time interval prior to needle puncture of the skin. ANESTHESIA/SEDATION: General - as administered by the Anesthesia department FLUOROSCOPY TIME:  None. COMPLICATIONS: SIR Level A - No therapy, no consequence. TECHNIQUE: Informed written consent was obtained from the patient after a thorough discussion of the procedural risks, benefits and alternatives. All questions were addressed. Maximal Sterile Barrier Technique was utilized including caps, mask, sterile gowns, sterile gloves, sterile drape, hand hygiene and skin antiseptic. A timeout was performed prior to the initiation of the procedure. A planning axial CT scan was  performed. The double-J ureteral stent is in good position. Careful comparison was made with the previous contrast enhanced images. The region of the mass was successfully localized. Using intermittent CT guidance, 3 ice force +probes were carefully advanced into the mass in a triangular configuration. Cryoablation was then performed using our standard protocol of 8 minutes freeze, 8 minutes active thaw, 8 minutes freeze. The ablation zone  was monitored intermittently throughout the procedure. There was no evidence of complication during the cryo ablation. The ice ball formed very well and appeared to encompass the entirety of the expected location of the lesion. Following final thaw, the probes were tested for mobility, found to be easily movable and removed. Post procedure triple phase CT imaging was then performed. No evidence of complication on the pre contrast, and arterial phase images. On the nephrographic phase, there is some pooling of contrast material at the hilar margin of the ablation zone. On the 3 minutes delayed phase imaging, there is a developing hemorrhage adjacent to the renal pelvis which extends inferiorly along the course of the ureter. Additional delayed imaging was subsequently obtained which demonstrated further expansion of the hemorrhage into the posterior perinephric space tracking along the psoas muscle. The patient was monitored carefully and remained hemodynamically stable. The patient was transferred to PACU in good condition. FINDINGS: Postprocedural perinephric hemorrhage extending into the posterior perirenal space. Excellent ablation zone a encompassing the entirety of the left renal mass. IMPRESSION: 1. Successful percutaneous cryoablation. 2. There is evidence of delayed postprocedural hemorrhage following thawing of the ice ball. Patient remains hemodynamically stable and will be carefully monitored and H and H followed. Transfusion will be performed as needed. If bleeding is  not self limited and there is evidence of hemodynamic changes, we will proceed with angiogram and possible embolization. Signed, Criselda Peaches, MD, [facility] Radiology Electronically Signed   By: Jacqulynn Cadet M.D.   On: 08/17/2019 14:12   DG C-Arm 1-60 Min-No Report  Result Date: 08/17/2019 Fluoroscopy was utilized by the requesting physician.  No radiographic interpretation.   IR Radiologist Eval & Mgmt  Result Date: 06/29/2019 Please refer to notes tab for details about interventional procedure. (Op Note)      ASSESSMENT & PLAN:  1. Thymoma   2. Renal cell cancer, left ([place])   3. MG (myasthenia gravis) ([facility])    #Invasive thymoma, S/p 2 cycle of  reduced carboplatin AUC 4, etoposide day 1 to 3 at 75 mg/m, every 3 weeks, and concurrent radiation Labs reviewed and discussed with patient and sister. Blood counts are stable.  Hemoglobin has normalized. Kidney function remains stable. I recommend to proceed with CT chest with contrast for further evaluation of treatment response. I wait to see if urology wants to proceed with abdomen images and if so images can be done together Patient has appointment with urology this afternoon. She previously takes methotrexate for myasthenia gravis.  Since now the thymoma has been treated, she is currently off methotrexate and is on observation.  #Left  RCC, patient is status post cryoablation of the lesion.  She also has stent placed prior to the procedure and will see urology for stent removal. . Advised patient to follow-up in 6 months. All questions were answered. The patient knows to call the clinic with any problems questions or concerns.   Earlie Server, MD, PhD Hematology Oncology [facility] at [facility] Pager- [phone]

## 2019-09-08 ENCOUNTER — Encounter: Payer: Self-pay | Admitting: Family Medicine

## 2019-09-08 LAB — URINALYSIS, COMPLETE

## 2019-09-08 LAB — MICROSCOPIC EXAMINATION
Bacteria, UA: NONE SEEN
RBC, Urine: >30 /hpf — AB (ref 0–2)

## 2019-09-14 ENCOUNTER — Other Ambulatory Visit: Payer: Self-pay

## 2019-09-14 ENCOUNTER — Ambulatory Visit
Admission: RE | Admit: 2019-09-14 | Discharge: 2019-09-14 | Disposition: A | Discharge status: Home / Self Care | Payer: PPO | Source: Ambulatory Visit | Attending: Student | Admitting: Student

## 2019-09-14 ENCOUNTER — Encounter: Payer: Self-pay | Admitting: *Deleted

## 2019-09-14 DIAGNOSIS — N2889 Other specified disorders of kidney and ureter: Secondary | ICD-10-CM

## 2019-09-14 DIAGNOSIS — C642 Malignant neoplasm of left kidney, except renal pelvis: Secondary | ICD-10-CM | POA: Diagnosis not present

## 2019-09-14 DIAGNOSIS — Z9889 Other specified postprocedural states: Secondary | ICD-10-CM | POA: Diagnosis not present

## 2019-09-14 HISTORY — PX: IR RADIOLOGIST EVAL & MGMT: IMG5224

## 2019-09-14 NOTE — Progress Notes (Signed)
Chief Complaint: Patient was seen in follow-up remotely today (TeleHealth) for left renal cell carcinoma at the request of Madison M.    Referring Physician(s): Hollice Espy  History of Present Illness: Madison Burns is a 69 y.o. female with multiple medical problems including developmental delay, myasthenia gravis,malignant invasive thymoma and left renal cell carcinoma.  I spoke predominantly with her sister who serves as her primary caregiver via our telephone conference today.  Her left lower pole renal lesion was first incidentally noted on a CT scan of the abdomen and pelvis from 02/07/2019.  Subsequent work-up with MRI performed to evaluate her right upper quadrant pain and left-sided renal mass was performed on 02/21/2019.  The MRI identified and endophytic 3.2 x 2.6 cm avidly enhancing lesion in the posterior lower pole of the left kidney.  Unfortunately, evaluation was somewhat limited by respiratory motion artifact.  Additionally, a lesion was identified in the mediastinum which was subsequently further evaluated by a dedicated chest CT.  She then underwent biopsy of both the left lower pole renal lesion and the mediastinal mass in February 2021.  She underwent a CT biopsy on 03/28/2019.  Biopsy consistent with renal cell carcinoma.  Pathology for the mediastinal mass came back as positive for invasive thymoma without evidence of malignancy.  The pathology for the left lower pole renal lesion came back as positive for renal cell carcinoma.  She first underwent treatment for her thymoma in which she received radiation treatment on 06/08/19 and underwent 2 cycles of reduced carboplatinand Taxol, now complete.  Her sister reports that she tolerated both the chemotherapy and the radiation therapy very well with no complications or side effects.  Surveillance CT Abdomen w wo contrastfrom 4/28/21revealed stable appearance of her biopsy-proven left lower pole renal cell  carcinoma.  I have reviewed her imaging independently.  The lesion is quite endophytic and extends toward the renal collecting system and the hilum.  By my measurements, the lesion measures up to 4 cm in diameter, slightly larger than previously reported.  She subsequently underwent placement of a left-sided double-J ureteral stent by urology followed by percutaneous renal cryoablation on 08/17/2019.  Her postprocedural course was complicated by hemorrhage into the perinephric space at the end of the procedure.  However, she remained hemodynamically stable and was carefully monitored.  The bleeding was self-limited and she was discharged home the next day in good condition.  We are meeting today in follow-up via teleconference.  As usual, her sister is present with her and providing the majority of her medical history.  [name] is doing quite well overall.  She complains of some mild mid epigastric abdominal pain which her sister feels is related to her IBS.  She has had no hematuria since having her double-J ureteral stent removed.  She does not complain of any additional systemic symptoms at this time.  She seems to be in good spirits.  Past Medical History:  Diagnosis Date  . Cancer of kidney ([place]) 2021  . Complication of anesthesia    Myasthenia gravis  . GERD (gastroesophageal reflux disease)   . History of seizure disorder   . Hypercholesterolemia   . Hypertension    no longer has it. wt loss  . Hypothyroidism   . Irritable bowel syndrome   . Myasthenia gravis ([place]) 2011  . OSA on CPAP   . Skin cancer 2017   Melanoma on neck    Past Surgical History:  Procedure Laterality Date  . ABDOMINAL  HYSTERECTOMY  1990  . APPENDECTOMY  1999  . CHOLECYSTECTOMY  2013  . COLONOSCOPY WITH PROPOFOL N/A 06/30/2017   Procedure: COLONOSCOPY WITH PROPOFOL;  Surgeon: Toledo, Benay Pike, MD;  Location: [facility] END[facility]COPY;  Service: Gastroenterology;  Laterality: N/A;  . CYSTOSCOPY W/ URETERAL STENT  PLACEMENT Left 08/17/2019   Procedure: CYSTOSCOPY WITH RETROGRADE PYELOGRAM/URETERAL STENT PLACEMENT;  Surgeon: Alexis Frock, MD;  Location: [facility];  Service: Urology;  Laterality: Left;  30 MINS  . ESOPHAGOGASTRODUODENOSCOPY (EGD) WITH PROPOFOL N/A 06/30/2017   Procedure: ESOPHAGOGASTRODUODENOSCOPY (EGD) WITH PROPOFOL;  Surgeon: Toledo, Benay Pike, MD;  Location: [facility] ENDOSCOPY;  Service: Gastroenterology;  Laterality: N/A;  . IR RADIOLOGIST EVAL & MGMT  06/28/2019  . RADIOFREQUENCY ABLATION Left 08/17/2019   Procedure: LEFT RENAL CRYO ABLATION;  Surgeon: Jacqulynn Cadet, MD;  Location: [facility];  Service: Anesthesiology;  Laterality: Left;  . TONSILLECTOMY  1959    Allergies: Patient has no known allergies.  Medications: Prior to Admission medications   Medication Sig Start Date End Date Taking? Authorizing Provider  Ascorbic Acid (VITAMIN C) 1000 MG tablet Take 1,000 mg by mouth at bedtime.    [provider]  B Complex-C (B-COMPLEX WITH VITAMIN C) tablet Take 1 tablet by mouth every evening.    [provider]  benzonatate (TESSALON) 100 MG capsule Take 1 capsule (100 mg total) by mouth in the morning and at bedtime. MORNING & 1430 08/01/19   Einar Pheasant, MD  Biotin 10000 MCG TABS Take 10,000 mcg by mouth daily at 2 PM. (1430)    [provider]  Calcium-Phosphorus-Vitamin D (CITRACAL +D3 PO) Take 1 tablet by mouth daily.    [provider]  DULoxetine (CYMBALTA) 30 MG capsule Take 1 capsule (30 mg total) by mouth daily. 09/07/19   Earlie Server, MD  folic acid (FOLVITE) 1 MG tablet Take 2 tablets (2 mg total) by mouth daily. 03/09/19   Dohmeier, Asencion Partridge, MD  Garlic 3016 MG CAPS Take 1,000 mg by mouth in the morning, at noon, and at bedtime. (0800, 1430, 2000)    [provider]  HYDROcodone-acetaminophen (NORCO/VICODIN) 5-325 MG tablet Take 1 tablet by mouth every 6 (six) hours as needed for severe pain. 08/18/19   Louk, Bea Graff, PA-C    levothyroxine (SYNTHROID) 75 MCG tablet TAKE 1 TABLET BY MOUTH DAILY ON AN EMPTYSTOMACH. WAIT [place] OTHER MEDS Patient taking differently: Take 75 mcg by mouth daily before breakfast.  05/21/19   Einar Pheasant, MD  pantoprazole (PROTONIX) 40 MG tablet Take 40 mg by mouth daily.  11/24/17   [provider]  Probiotic Product (PROBIOTIC MULTI-ENZYME PO) Take 1 capsule by mouth in the morning and at bedtime. Probiotic Multi-Enzyme Digestive Formula    [provider]  pyridostigmine (MESTINON) 60 MG tablet TAKE 1 TABLET BY MOUTH AT  8 AM, ONE AT LUNCH AND ONE AT 8PM 08/23/19   Dohmeier, Asencion Partridge, MD  risperiDONE (RISPERDAL) 2 MG tablet Take 2 mg by mouth at bedtime.  07/08/18   [provider]  sucralfate (CARAFATE) 1 g tablet Take 1 g by mouth 2 (two) times daily. (0800 & 1700) 05/25/17   [provider]  tolterodine (DETROL LA) 4 MG 24 hr capsule TAKE 1 CAPSULE BY MOUTH ONCE EVERY MORNING Patient taking differently: Take 4 mg by mouth daily.  07/06/19   Einar Pheasant, MD  vitamin E 180 MG (400 UNITS) capsule Take 400 Units by mouth daily at 2 PM. 1430    [provider]     Family History  Problem Relation Age of Onset  . Colon cancer Maternal Grandfather        stomach/colon  . Heart disease Father        myocardial infarction  . Hyperlipidemia Father   . Hyperlipidemia Sister   . Diabetes Sister   . Bone cancer Other        cousin  . Alzheimer's disease Mother        maternal aunts  . Skin cancer Mother   . Myasthenia gravis Brother        ocular  . Skin cancer Brother   . Stroke Paternal Grandfather   . Breast cancer Neg Hx     Social History   Socioeconomic History  . Marital status: Single    Spouse name: Not on file  . Number of children: 0  . Years of education: Special Ed  . Highest education level: Not on file  Occupational History  . Occupation: Unemployed  Tobacco Use  . Smoking status: Never Smoker  .  Smokeless tobacco: Never Used  Vaping Use  . Vaping Use: Never used  Substance and Sexual Activity  . Alcohol use: No    Alcohol/week: 0.0 standard drinks  . Drug use: No  . Sexual activity: Not Currently  Other Topics Concern  . Not on file  Social History Narrative   06/23/17 lives with sister, [name]   Regular exercise-no   Caffeine Use-yes   Social Determinants of Health   Financial Resource Strain:   . Difficulty of Paying Living Expenses:   Food Insecurity:   . Worried About Charity fundraiser in the Last Year:   . Arboriculturist in the Last Year:   Transportation Needs:   . Film/video editor (Medical):   [name] Lack of Transportation (Non-Medical):   Physical Activity:   . Days of Exercise per Week:   . Minutes of Exercise per Session:   Stress:   . Feeling of Stress :   Social Connections:   . Frequency of Communication with Friends and Family:   . Frequency of Social Gatherings with Friends and Family:   . Attends Religious Services:   . Active Member of Clubs or Organizations:   . Attends Archivist Meetings:   [name] Marital Status:     ECOG Status: 0 - Asymptomatic  Review of Systems  Review of Systems: A 12 point ROS discussed and pertinent positives are indicated in the HPI above.  All other systems are negative.  Physical Exam No direct physical exam was performed (except for noted visual exam findings with Video Visits).    Vital Signs: There were no vitals taken for this visit.  Imaging: CT GUIDE TISSUE ABLATION  Result Date: 08/17/2019 INDICATION: 69 year old female with developmental delay, malignant invasive thymoma, myasthenia gravis and left-sided renal cell carcinoma. She is not considered an optimal surgical candidate for nephrectomy given her history of myasthenia gravis. She presents today following placement of a left-sided double-J ureteral stent for percutaneous cryoablation. EXAM: Percutaneous cryoablation of left renal mass  COMPARISON:  CT abdomen/pelvis 06/15/2019 MEDICATIONS: 2 g Ancef; The antibiotic was administered in an appropriate time interval prior to needle puncture of the skin. ANESTHESIA/SEDATION: General - as administered by the Anesthesia department FLUOROSCOPY TIME:  None. COMPLICATIONS: SIR Level A - No therapy, no consequence. TECHNIQUE: Informed written consent was obtained from the patient after a thorough discussion of the procedural risks, benefits and alternatives.  All questions were addressed. Maximal Sterile Barrier Technique was utilized including caps, mask, sterile gowns, sterile gloves, sterile drape, hand hygiene and skin antiseptic. A timeout was performed prior to the initiation of the procedure. A planning axial CT scan was performed. The double-J ureteral stent is in good position. Careful comparison was made with the previous contrast enhanced images. The region of the mass was successfully localized. Using intermittent CT guidance, 3 ice force +probes were carefully advanced into the mass in a triangular configuration. Cryoablation was then performed using our standard protocol of 8 minutes freeze, 8 minutes active thaw, 8 minutes freeze. The ablation zone was monitored intermittently throughout the procedure. There was no evidence of complication during the cryo ablation. The ice ball formed very well and appeared to encompass the entirety of the expected location of the lesion. Following final thaw, the probes were tested for mobility, found to be easily movable and removed. Post procedure triple phase CT imaging was then performed. No evidence of complication on the pre contrast, and arterial phase images. On the nephrographic phase, there is some pooling of contrast material at the hilar margin of the ablation zone. On the 3 minutes delayed phase imaging, there is a developing hemorrhage adjacent to the renal pelvis which extends inferiorly along the course of the ureter. Additional delayed  imaging was subsequently obtained which demonstrated further expansion of the hemorrhage into the posterior perinephric space tracking along the psoas muscle. The patient was monitored carefully and remained hemodynamically stable. The patient was transferred to PACU in good condition. FINDINGS: Postprocedural perinephric hemorrhage extending into the posterior perirenal space. Excellent ablation zone a encompassing the entirety of the left renal mass. IMPRESSION: 1. Successful percutaneous cryoablation. 2. There is evidence of delayed postprocedural hemorrhage following thawing of the ice ball. Patient remains hemodynamically stable and will be carefully monitored and H and H followed. Transfusion will be performed as needed. If bleeding is not self limited and there is evidence of hemodynamic changes, we will proceed with angiogram and possible embolization. Signed, Criselda Peaches, MD, [facility] Radiology Electronically Signed   By: Jacqulynn Cadet BurnsD.   On: 08/17/2019 14:12   DG C-Arm 1-60 Min-No Report  Result Date: 08/17/2019 Fluoroscopy was utilized by the requesting physician.  No radiographic interpretation.    Labs:  CBC: Recent Labs    08/17/19 0632 08/17/19 0632 08/17/19 1530 08/18/19 0542 08/18/19 1249 09/07/19 0920  WBC 6.3  --  14.3* 12.7*  --  9.0  HGB 13.3   < > 11.9* 10.2* 10.2* 12.5  HCT 40.3   < > 37.2 30.4* 31.1* 38.0  PLT 120*  --  114* 114*  --  173   < > = values in this interval not displayed.    COAGS: Recent Labs    03/28/19 0953 08/17/19 0632  INR 0.9 1.0    BMP: Recent Labs    06/08/19 1017 08/05/19 1454 08/17/19 0632 09/07/19 0920  NA 142 140 141 139  K 3.5 3.8 3.4* 4.0  CL 107 107 106 107  CO2 25 25 23 22   GLUCOSE 92 109* 111* 126*  BUN 14 19 14 19   CALCIUM 8.2* 8.6* 8.5* 8.3*  CREATININE 0.87 0.76 0.90 0.92  GFRNONAA >60 >60 >60 >60  GFRAA >60 >60 >60 >60    LIVER  FUNCTION TESTS: Recent Labs    05/26/19 0920 06/01/19 1325 06/08/19 1017 09/07/19 0920  BILITOT 0.9 0.7 0.3 0.8  AST  20 23 25  35  ALT 25 23 20 20   ALKPHOS 50 53 78 68  PROT 6.3* 6.6 6.4* 6.7  ALBUMIN 3.6 3.9 3.7 3.5    TUMOR MARKERS: No results for input(s): AFPTM, CEA, CA199, CHROMGRNA in the last 8760 hours.  Assessment and Plan:  68 year old female with a left renal cell neoplasm consistent with renal cell carcinoma now 1 month status post percutaneous cryoablation.  She is doing very well and her postprocedural course has not been complicated.  We will continue surveillance as planned.  1.)  Follow-up MRI of the abdomen with gadolinium contrast and accompanying clinic visit in 6 months.  Please include a CBC and basic metabolic panel.   Electronically Signed: Jacqulynn Cadet 09/14/2019, 1:14 PM   I spent a total of  10 Minutes in remote  clinical consultation, greater than 50% of which was counseling/coordinating care for renal neoplasm.    Visit type: Audio and video (WebEx).   Alternative for in-person consultation at [facility], [address], [place], [place]. This visit type was conducted due to national recommendations for restrictions regarding the COVID-19 Pandemic (e.g. social distancing).  This format is felt to be most appropriate for this patient at this time.  All issues noted in this document were discussed and addressed.

## 2019-09-27 DIAGNOSIS — Z85820 Personal history of malignant melanoma of skin: Secondary | ICD-10-CM | POA: Diagnosis not present

## 2019-09-27 DIAGNOSIS — L820 Inflamed seborrheic keratosis: Secondary | ICD-10-CM | POA: Diagnosis not present

## 2019-09-27 DIAGNOSIS — B379 Candidiasis, unspecified: Secondary | ICD-10-CM | POA: Diagnosis not present

## 2019-09-27 DIAGNOSIS — L821 Other seborrheic keratosis: Secondary | ICD-10-CM | POA: Diagnosis not present

## 2019-09-27 DIAGNOSIS — L578 Other skin changes due to chronic exposure to nonionizing radiation: Secondary | ICD-10-CM | POA: Diagnosis not present

## 2019-09-27 DIAGNOSIS — D485 Neoplasm of uncertain behavior of skin: Secondary | ICD-10-CM | POA: Diagnosis not present

## 2019-09-27 DIAGNOSIS — L720 Epidermal cyst: Secondary | ICD-10-CM | POA: Diagnosis not present

## 2019-09-27 DIAGNOSIS — Z86018 Personal history of other benign neoplasm: Secondary | ICD-10-CM | POA: Diagnosis not present

## 2019-10-05 ENCOUNTER — Other Ambulatory Visit: Payer: Self-pay | Admitting: Internal Medicine

## 2019-10-11 DIAGNOSIS — L720 Epidermal cyst: Secondary | ICD-10-CM | POA: Diagnosis not present

## 2019-10-13 ENCOUNTER — Ambulatory Visit: Admission: RE | Admit: 2019-10-13 | Payer: PPO | Source: Ambulatory Visit

## 2019-10-20 ENCOUNTER — Other Ambulatory Visit: Payer: Self-pay

## 2019-10-20 ENCOUNTER — Ambulatory Visit
Admission: RE | Admit: 2019-10-20 | Discharge: 2019-10-20 | Disposition: A | Discharge status: Home / Self Care | Payer: PPO | Source: Ambulatory Visit | Attending: Radiation Oncology | Admitting: Radiation Oncology

## 2019-10-20 ENCOUNTER — Other Ambulatory Visit: Payer: Self-pay | Admitting: *Deleted

## 2019-10-20 ENCOUNTER — Encounter: Payer: Self-pay | Admitting: Radiation Oncology

## 2019-10-20 VITALS — Temp 99.4°F | Wt 204.0 lb

## 2019-10-20 DIAGNOSIS — D4989 Neoplasm of unspecified behavior of other specified sites: Secondary | ICD-10-CM

## 2019-10-20 DIAGNOSIS — C37 Malignant neoplasm of thymus: Secondary | ICD-10-CM | POA: Diagnosis not present

## 2019-10-20 DIAGNOSIS — D150 Benign neoplasm of thymus: Secondary | ICD-10-CM

## 2019-10-20 NOTE — Progress Notes (Signed)
Radiation Oncology Follow up Note  Name: Madison Burns   Date:   10/20/2019 MRN:  [id number] DOB: [birth year]    This 69 y.o. female presents to the clinic today for 1-month follow-up status post external beam radiation therapy to her mediastinum for a thymoma.Gerilyn Nestle IVa  REFERRING PROVIDER: Einar Pheasant, MD  HPI: Patient is a 69 year old female now out 4 months having completed external beam radiation therapy for..[place] IVa thymoma.  Seen today she is doing well specifically denies dysphagia cough or any chest pain.  According to her sister her myasthenia gravis symptoms have improved markedly.  She missed a scheduled CT scan I have rescheduled out in the next 2 weeks.  COMPLICATIONS OF TREATMENT: none  FOLLOW UP COMPLIANCE: keeps appointments   PHYSICAL EXAM:  Temp 99.4 F (37.4 C) (Tympanic)   Wt 204 lb (92.5 kg)   BMI 36.14 kg/m  Well-developed well-nourished patient in NAD. HEENT reveals PERLA, EOMI, discs not visualized.  Oral cavity is clear. No oral mucosal lesions are identified. Neck is clear without evidence of cervical or supraclavicular adenopathy. Lungs are clear to A&P. Cardiac examination is essentially unremarkable with regular rate and rhythm without murmur rub or thrill. Abdomen is benign with no organomegaly or masses noted. Motor sensory and DTR levels are equal and symmetric in the upper and lower extremities. Cranial nerves II through XII are grossly intact. Proprioception is intact. No peripheral adenopathy or edema is identified. No motor or sensory levels are noted. Crude visual fields are within normal range.  RADIOLOGY RESULTS: CT scan of the chest with contrast ordered  PLAN: Present time patient is doing well.  Sounds like she is had an excellent response to external beam radiation therapy.  I have ordered a CT scan which I will review in the next 2 weeks.  Of asked to see her back in 6 months for follow-up.  Patient knows to call with any  concerns.  I would like to take this opportunity to thank you for allowing me to participate in the care of your patient.Noreene Filbert, MD

## 2019-10-21 ENCOUNTER — Encounter: Payer: Self-pay | Admitting: Emergency Medicine

## 2019-10-21 ENCOUNTER — Emergency Department: Payer: PPO

## 2019-10-21 ENCOUNTER — Other Ambulatory Visit: Payer: Self-pay

## 2019-10-21 DIAGNOSIS — E872 Acidosis: Secondary | ICD-10-CM | POA: Diagnosis present

## 2019-10-21 DIAGNOSIS — Z90710 Acquired absence of both cervix and uterus: Secondary | ICD-10-CM

## 2019-10-21 DIAGNOSIS — N3281 Overactive bladder: Secondary | ICD-10-CM | POA: Diagnosis present

## 2019-10-21 DIAGNOSIS — J9690 Respiratory failure, unspecified, unspecified whether with hypoxia or hypercapnia: Secondary | ICD-10-CM | POA: Diagnosis not present

## 2019-10-21 DIAGNOSIS — Z7189 Other specified counseling: Secondary | ICD-10-CM | POA: Diagnosis not present

## 2019-10-21 DIAGNOSIS — Z66 Do not resuscitate: Secondary | ICD-10-CM | POA: Diagnosis not present

## 2019-10-21 DIAGNOSIS — Z85528 Personal history of other malignant neoplasm of kidney: Secondary | ICD-10-CM | POA: Diagnosis not present

## 2019-10-21 DIAGNOSIS — E669 Obesity, unspecified: Secondary | ICD-10-CM | POA: Diagnosis present

## 2019-10-21 DIAGNOSIS — A419 Sepsis, unspecified organism: Secondary | ICD-10-CM | POA: Diagnosis present

## 2019-10-21 DIAGNOSIS — J9602 Acute respiratory failure with hypercapnia: Secondary | ICD-10-CM | POA: Diagnosis present

## 2019-10-21 DIAGNOSIS — Z6837 Body mass index (BMI) 37.0-37.9, adult: Secondary | ICD-10-CM | POA: Diagnosis not present

## 2019-10-21 DIAGNOSIS — R791 Abnormal coagulation profile: Secondary | ICD-10-CM | POA: Diagnosis not present

## 2019-10-21 DIAGNOSIS — Z85820 Personal history of malignant melanoma of skin: Secondary | ICD-10-CM | POA: Diagnosis not present

## 2019-10-21 DIAGNOSIS — K219 Gastro-esophageal reflux disease without esophagitis: Secondary | ICD-10-CM | POA: Diagnosis present

## 2019-10-21 DIAGNOSIS — G7001 Myasthenia gravis with (acute) exacerbation: Secondary | ICD-10-CM | POA: Diagnosis present

## 2019-10-21 DIAGNOSIS — R079 Chest pain, unspecified: Secondary | ICD-10-CM | POA: Diagnosis not present

## 2019-10-21 DIAGNOSIS — J9601 Acute respiratory failure with hypoxia: Secondary | ICD-10-CM | POA: Diagnosis present

## 2019-10-21 DIAGNOSIS — E039 Hypothyroidism, unspecified: Secondary | ICD-10-CM | POA: Diagnosis present

## 2019-10-21 DIAGNOSIS — F329 Major depressive disorder, single episode, unspecified: Secondary | ICD-10-CM | POA: Diagnosis present

## 2019-10-21 DIAGNOSIS — Z79890 Hormone replacement therapy (postmenopausal): Secondary | ICD-10-CM

## 2019-10-21 DIAGNOSIS — Z20822 Contact with and (suspected) exposure to covid-19: Secondary | ICD-10-CM | POA: Diagnosis present

## 2019-10-21 DIAGNOSIS — J189 Pneumonia, unspecified organism: Secondary | ICD-10-CM | POA: Diagnosis present

## 2019-10-21 DIAGNOSIS — I480 Paroxysmal atrial fibrillation: Secondary | ICD-10-CM | POA: Diagnosis not present

## 2019-10-21 DIAGNOSIS — Z9221 Personal history of antineoplastic chemotherapy: Secondary | ICD-10-CM

## 2019-10-21 DIAGNOSIS — D4989 Neoplasm of unspecified behavior of other specified sites: Secondary | ICD-10-CM | POA: Diagnosis not present

## 2019-10-21 DIAGNOSIS — E7800 Pure hypercholesterolemia, unspecified: Secondary | ICD-10-CM | POA: Diagnosis present

## 2019-10-21 DIAGNOSIS — R109 Unspecified abdominal pain: Secondary | ICD-10-CM | POA: Diagnosis not present

## 2019-10-21 DIAGNOSIS — Z923 Personal history of irradiation: Secondary | ICD-10-CM | POA: Diagnosis not present

## 2019-10-21 DIAGNOSIS — G4733 Obstructive sleep apnea (adult) (pediatric): Secondary | ICD-10-CM | POA: Diagnosis present

## 2019-10-21 DIAGNOSIS — J90 Pleural effusion, not elsewhere classified: Secondary | ICD-10-CM | POA: Diagnosis not present

## 2019-10-21 DIAGNOSIS — I10 Essential (primary) hypertension: Secondary | ICD-10-CM | POA: Diagnosis present

## 2019-10-21 DIAGNOSIS — Z79899 Other long term (current) drug therapy: Secondary | ICD-10-CM | POA: Diagnosis not present

## 2019-10-21 DIAGNOSIS — Z9049 Acquired absence of other specified parts of digestive tract: Secondary | ICD-10-CM | POA: Diagnosis not present

## 2019-10-21 DIAGNOSIS — G40909 Epilepsy, unspecified, not intractable, without status epilepticus: Secondary | ICD-10-CM | POA: Diagnosis present

## 2019-10-21 DIAGNOSIS — G7000 Myasthenia gravis without (acute) exacerbation: Secondary | ICD-10-CM | POA: Diagnosis not present

## 2019-10-21 DIAGNOSIS — J811 Chronic pulmonary edema: Secondary | ICD-10-CM | POA: Diagnosis not present

## 2019-10-21 DIAGNOSIS — J9811 Atelectasis: Secondary | ICD-10-CM | POA: Diagnosis not present

## 2019-10-21 DIAGNOSIS — R6250 Unspecified lack of expected normal physiological development in childhood: Secondary | ICD-10-CM | POA: Diagnosis present

## 2019-10-21 DIAGNOSIS — R0602 Shortness of breath: Secondary | ICD-10-CM | POA: Diagnosis not present

## 2019-10-21 DIAGNOSIS — R0902 Hypoxemia: Secondary | ICD-10-CM | POA: Diagnosis not present

## 2019-10-21 DIAGNOSIS — K279 Peptic ulcer, site unspecified, unspecified as acute or chronic, without hemorrhage or perforation: Secondary | ICD-10-CM | POA: Diagnosis present

## 2019-10-21 DIAGNOSIS — I4891 Unspecified atrial fibrillation: Secondary | ICD-10-CM | POA: Diagnosis not present

## 2019-10-21 DIAGNOSIS — E871 Hypo-osmolality and hyponatremia: Secondary | ICD-10-CM | POA: Diagnosis present

## 2019-10-21 DIAGNOSIS — R000 Tachycardia, unspecified: Secondary | ICD-10-CM | POA: Diagnosis not present

## 2019-10-21 DIAGNOSIS — R9431 Abnormal electrocardiogram [ECG] [EKG]: Secondary | ICD-10-CM | POA: Diagnosis not present

## 2019-10-21 DIAGNOSIS — Z515 Encounter for palliative care: Secondary | ICD-10-CM | POA: Diagnosis not present

## 2019-10-21 DIAGNOSIS — R0603 Acute respiratory distress: Secondary | ICD-10-CM | POA: Diagnosis not present

## 2019-10-21 DIAGNOSIS — E876 Hypokalemia: Secondary | ICD-10-CM | POA: Diagnosis present

## 2019-10-21 LAB — CBC WITH DIFFERENTIAL/PLATELET
Abs Immature Granulocytes: 0.07 10*3/uL (ref 0.00–0.07)
Basophils Absolute: 0 10*3/uL (ref 0.0–0.1)
Basophils Relative: 0 %
Eosinophils Absolute: 0 10*3/uL (ref 0.0–0.5)
Eosinophils Relative: 0 %
HCT: 44.3 % (ref 36.0–46.0)
Hemoglobin: 14.6 g/dL (ref 12.0–15.0)
Immature Granulocytes: 1 %
Lymphocytes Relative: 6 %
Lymphs Abs: 0.8 10*3/uL (ref 0.7–4.0)
MCH: 30.3 pg (ref 26.0–34.0)
MCHC: 33 g/dL (ref 30.0–36.0)
MCV: 91.9 fL (ref 80.0–100.0)
Monocytes Absolute: 1 10*3/uL (ref 0.1–1.0)
Monocytes Relative: 7 %
Neutro Abs: 11.9 10*3/uL — ABNORMAL HIGH (ref 1.7–7.7)
Neutrophils Relative %: 86 %
Platelets: 150 10*3/uL (ref 150–400)
RBC: 4.82 MIL/uL (ref 3.87–5.11)
RDW: 13.8 % (ref 11.5–15.5)
WBC: 13.8 10*3/uL — ABNORMAL HIGH (ref 4.0–10.5)
nRBC: 0 % (ref 0.0–0.2)

## 2019-10-21 LAB — PROTIME-INR
INR: 0.9 (ref 0.8–1.2)
Prothrombin Time: 11.9 seconds (ref 11.4–15.2)

## 2019-10-21 LAB — COMPREHENSIVE METABOLIC PANEL
ALT: 19 U/L (ref 0–44)
AST: 24 U/L (ref 15–41)
Albumin: 3.9 g/dL (ref 3.5–5.0)
Alkaline Phosphatase: 59 U/L (ref 38–126)
Anion gap: 12 (ref 5–15)
BUN: 14 mg/dL (ref 8–23)
CO2: 24 mmol/L (ref 22–32)
Calcium: 8.8 mg/dL — ABNORMAL LOW (ref 8.9–10.3)
Chloride: 98 mmol/L (ref 98–111)
Creatinine, Ser: 0.71 mg/dL (ref 0.44–1.00)
GFR calc Af Amer: >60 mL/min (ref >60)
GFR calc non Af Amer: >60 mL/min (ref >60)
Glucose, Bld: 185 mg/dL — ABNORMAL HIGH (ref 70–99)
Potassium: 3.4 mmol/L — ABNORMAL LOW (ref 3.5–5.1)
Sodium: 134 mmol/L — ABNORMAL LOW (ref 135–145)
Total Bilirubin: 0.9 mg/dL (ref 0.3–1.2)
Total Protein: 7.3 g/dL (ref 6.5–8.1)

## 2019-10-21 LAB — LACTIC ACID, PLASMA: Lactic Acid, Venous: 1.8 mmol/L (ref 0.5–1.9)

## 2019-10-21 LAB — TROPONIN I (HIGH SENSITIVITY): Troponin I (High Sensitivity): 8 ng/L (ref <18)

## 2019-10-21 MED ORDER — ACETAMINOPHEN 325 MG PO TABS
ORAL_TABLET | ORAL | Status: AC
  Administered 2019-10-24: 650 mg via ORAL
  Filled 2019-10-21: qty 2

## 2019-10-21 MED ORDER — ACETAMINOPHEN 325 MG PO TABS
650.0000 mg | ORAL_TABLET | Freq: Once | ORAL | Status: AC
  Administered 2019-10-21: 650 mg via ORAL

## 2019-10-21 NOTE — ED Notes (Signed)
Pt's sister reports spt took a CBD gummie around 7pm

## 2019-10-21 NOTE — ED Triage Notes (Signed)
Pt reports right sided chest pain started this evening, reports feeling short of breath. Denies any injuries. Pt talks in complete sentences no respiratory distress noted

## 2019-10-22 ENCOUNTER — Emergency Department: Payer: PPO

## 2019-10-22 ENCOUNTER — Other Ambulatory Visit: Payer: Self-pay

## 2019-10-22 ENCOUNTER — Inpatient Hospital Stay
Admission: EM | Admit: 2019-10-22 | Discharge: 2019-10-28 | DRG: 871 | Disposition: A | Discharge status: Home Health | Payer: PPO | Attending: Internal Medicine | Admitting: Internal Medicine

## 2019-10-22 ENCOUNTER — Encounter: Payer: Self-pay | Admitting: Radiology

## 2019-10-22 DIAGNOSIS — E876 Hypokalemia: Secondary | ICD-10-CM

## 2019-10-22 DIAGNOSIS — I10 Essential (primary) hypertension: Secondary | ICD-10-CM

## 2019-10-22 DIAGNOSIS — E871 Hypo-osmolality and hyponatremia: Secondary | ICD-10-CM | POA: Diagnosis present

## 2019-10-22 DIAGNOSIS — Z923 Personal history of irradiation: Secondary | ICD-10-CM | POA: Diagnosis not present

## 2019-10-22 DIAGNOSIS — E872 Acidosis: Secondary | ICD-10-CM | POA: Diagnosis present

## 2019-10-22 DIAGNOSIS — E039 Hypothyroidism, unspecified: Secondary | ICD-10-CM | POA: Diagnosis present

## 2019-10-22 DIAGNOSIS — G7001 Myasthenia gravis with (acute) exacerbation: Secondary | ICD-10-CM | POA: Diagnosis present

## 2019-10-22 DIAGNOSIS — G40909 Epilepsy, unspecified, not intractable, without status epilepticus: Secondary | ICD-10-CM | POA: Diagnosis present

## 2019-10-22 DIAGNOSIS — Z90710 Acquired absence of both cervix and uterus: Secondary | ICD-10-CM | POA: Diagnosis not present

## 2019-10-22 DIAGNOSIS — Z515 Encounter for palliative care: Secondary | ICD-10-CM

## 2019-10-22 DIAGNOSIS — Z6837 Body mass index (BMI) 37.0-37.9, adult: Secondary | ICD-10-CM | POA: Diagnosis not present

## 2019-10-22 DIAGNOSIS — R079 Chest pain, unspecified: Secondary | ICD-10-CM | POA: Diagnosis not present

## 2019-10-22 DIAGNOSIS — G4733 Obstructive sleep apnea (adult) (pediatric): Secondary | ICD-10-CM | POA: Diagnosis present

## 2019-10-22 DIAGNOSIS — Z79890 Hormone replacement therapy (postmenopausal): Secondary | ICD-10-CM | POA: Diagnosis not present

## 2019-10-22 DIAGNOSIS — E669 Obesity, unspecified: Secondary | ICD-10-CM | POA: Diagnosis present

## 2019-10-22 DIAGNOSIS — J189 Pneumonia, unspecified organism: Secondary | ICD-10-CM | POA: Diagnosis not present

## 2019-10-22 DIAGNOSIS — L8990 Pressure ulcer of unspecified site, unspecified stage: Secondary | ICD-10-CM | POA: Insufficient documentation

## 2019-10-22 DIAGNOSIS — Z85820 Personal history of malignant melanoma of skin: Secondary | ICD-10-CM | POA: Diagnosis not present

## 2019-10-22 DIAGNOSIS — K219 Gastro-esophageal reflux disease without esophagitis: Secondary | ICD-10-CM | POA: Diagnosis present

## 2019-10-22 DIAGNOSIS — N2889 Other specified disorders of kidney and ureter: Secondary | ICD-10-CM | POA: Diagnosis present

## 2019-10-22 DIAGNOSIS — J9602 Acute respiratory failure with hypercapnia: Secondary | ICD-10-CM

## 2019-10-22 DIAGNOSIS — R7989 Other specified abnormal findings of blood chemistry: Secondary | ICD-10-CM

## 2019-10-22 DIAGNOSIS — Z20822 Contact with and (suspected) exposure to covid-19: Secondary | ICD-10-CM | POA: Diagnosis present

## 2019-10-22 DIAGNOSIS — R0603 Acute respiratory distress: Secondary | ICD-10-CM | POA: Diagnosis not present

## 2019-10-22 DIAGNOSIS — G7000 Myasthenia gravis without (acute) exacerbation: Secondary | ICD-10-CM | POA: Diagnosis not present

## 2019-10-22 DIAGNOSIS — Z7189 Other specified counseling: Secondary | ICD-10-CM | POA: Diagnosis not present

## 2019-10-22 DIAGNOSIS — E7800 Pure hypercholesterolemia, unspecified: Secondary | ICD-10-CM | POA: Diagnosis present

## 2019-10-22 DIAGNOSIS — A419 Sepsis, unspecified organism: Secondary | ICD-10-CM | POA: Diagnosis present

## 2019-10-22 DIAGNOSIS — R0602 Shortness of breath: Secondary | ICD-10-CM

## 2019-10-22 DIAGNOSIS — J9601 Acute respiratory failure with hypoxia: Secondary | ICD-10-CM | POA: Diagnosis not present

## 2019-10-22 DIAGNOSIS — I4891 Unspecified atrial fibrillation: Secondary | ICD-10-CM | POA: Diagnosis present

## 2019-10-22 DIAGNOSIS — R509 Fever, unspecified: Secondary | ICD-10-CM

## 2019-10-22 DIAGNOSIS — R9431 Abnormal electrocardiogram [ECG] [EKG]: Secondary | ICD-10-CM | POA: Diagnosis not present

## 2019-10-22 DIAGNOSIS — D4989 Neoplasm of unspecified behavior of other specified sites: Secondary | ICD-10-CM | POA: Diagnosis not present

## 2019-10-22 DIAGNOSIS — R109 Unspecified abdominal pain: Secondary | ICD-10-CM

## 2019-10-22 DIAGNOSIS — Z79899 Other long term (current) drug therapy: Secondary | ICD-10-CM | POA: Diagnosis not present

## 2019-10-22 DIAGNOSIS — Z9049 Acquired absence of other specified parts of digestive tract: Secondary | ICD-10-CM | POA: Diagnosis not present

## 2019-10-22 DIAGNOSIS — R000 Tachycardia, unspecified: Secondary | ICD-10-CM

## 2019-10-22 DIAGNOSIS — Z85528 Personal history of other malignant neoplasm of kidney: Secondary | ICD-10-CM | POA: Diagnosis not present

## 2019-10-22 DIAGNOSIS — Z66 Do not resuscitate: Secondary | ICD-10-CM | POA: Diagnosis not present

## 2019-10-22 LAB — TROPONIN I (HIGH SENSITIVITY)
Troponin I (High Sensitivity): 10 ng/L (ref <18)
Troponin I (High Sensitivity): 14 ng/L (ref <18)
Troponin I (High Sensitivity): 24 ng/L — ABNORMAL HIGH (ref <18)

## 2019-10-22 LAB — BRAIN NATRIURETIC PEPTIDE: B Natriuretic Peptide: 46.4 pg/mL (ref 0.0–100.0)

## 2019-10-22 LAB — BLOOD GAS, ARTERIAL
Acid-base deficit: 1.4 mmol/L (ref 0.0–2.0)
Bicarbonate: 25.2 mmol/L (ref 20.0–28.0)
Delivery systems: POSITIVE
Expiratory PAP: 6
FIO2: 0.55
Inspiratory PAP: 14
O2 Saturation: 98.1 %
Patient temperature: 37
RATE: 12 resp/min
pCO2 arterial: 49 mmHg — ABNORMAL HIGH (ref 32.0–48.0)
pH, Arterial: 7.32 — ABNORMAL LOW (ref 7.350–7.450)
pO2, Arterial: 113 mmHg — ABNORMAL HIGH (ref 83.0–108.0)

## 2019-10-22 LAB — APTT: aPTT: 32 seconds (ref 24–36)

## 2019-10-22 LAB — URINALYSIS, COMPLETE (UACMP) WITH MICROSCOPIC
Bacteria, UA: NONE SEEN
Bilirubin Urine: NEGATIVE
Glucose, UA: NEGATIVE mg/dL
Hgb urine dipstick: NEGATIVE
Ketones, ur: NEGATIVE mg/dL
Nitrite: NEGATIVE
Protein, ur: >=300 mg/dL — AB
Specific Gravity, Urine: 1.04 — ABNORMAL HIGH (ref 1.005–1.030)
pH: 5 (ref 5.0–8.0)

## 2019-10-22 LAB — BLOOD GAS, VENOUS
Acid-Base Excess: 0.1 mmol/L (ref 0.0–2.0)
Bicarbonate: 27.8 mmol/L (ref 20.0–28.0)
O2 Saturation: 75.3 %
Patient temperature: 37
pCO2, Ven: 54 mmHg (ref 44.0–60.0)
pH, Ven: 7.32 (ref 7.250–7.430)
pO2, Ven: 44 mmHg (ref 32.0–45.0)

## 2019-10-22 LAB — PROCALCITONIN: Procalcitonin: <0.1 ng/mL

## 2019-10-22 LAB — LACTIC ACID, PLASMA: Lactic Acid, Venous: 0.8 mmol/L (ref 0.5–1.9)

## 2019-10-22 LAB — FIBRIN DERIVATIVES D-DIMER (ARMC ONLY): Fibrin derivatives D-dimer (ARMC): 1782.22 ng/mL (FEU) — ABNORMAL HIGH (ref 0.00–499.00)

## 2019-10-22 LAB — PHOSPHORUS: Phosphorus: 4 mg/dL (ref 2.5–4.6)

## 2019-10-22 LAB — TSH: TSH: 3.859 u[IU]/mL (ref 0.350–4.500)

## 2019-10-22 LAB — MAGNESIUM: Magnesium: 2.1 mg/dL (ref 1.7–2.4)

## 2019-10-22 LAB — SARS CORONAVIRUS 2 BY RT PCR (HOSPITAL ORDER, PERFORMED IN ~~LOC~~ HOSPITAL LAB): SARS Coronavirus 2: NEGATIVE

## 2019-10-22 MED ORDER — ONDANSETRON HCL 4 MG/2ML IJ SOLN: 4.0000 mg | Freq: Four times a day (QID) | INTRAMUSCULAR | Status: DC | PRN

## 2019-10-22 MED ORDER — FESOTERODINE FUMARATE ER 4 MG PO TB24
4.0000 mg | ORAL_TABLET | Freq: Every day | ORAL | Status: DC
  Administered 2019-10-22 – 2019-10-24 (×3): 4 mg via ORAL
  Filled 2019-10-22 (×4): qty 1

## 2019-10-22 MED ORDER — IMMUNE GLOBULIN (HUMAN) 10 GM/100ML IV SOLN
400.0000 mg/kg | INTRAVENOUS | Status: AC
  Administered 2019-10-22 – 2019-10-27 (×5): 35 g via INTRAVENOUS
  Filled 2019-10-22 (×4): qty 350
  Filled 2019-10-22: qty 300
  Filled 2019-10-22: qty 50
  Filled 2019-10-22: qty 300

## 2019-10-22 MED ORDER — IOHEXOL 350 MG/ML SOLN
100.0000 mL | Freq: Once | INTRAVENOUS | Status: AC | PRN
  Administered 2019-10-22: 100 mL via INTRAVENOUS

## 2019-10-22 MED ORDER — ENOXAPARIN SODIUM 40 MG/0.4ML ~~LOC~~ SOLN
40.0000 mg | SUBCUTANEOUS | Status: DC
  Administered 2019-10-22 – 2019-10-25 (×4): 40 mg via SUBCUTANEOUS
  Filled 2019-10-22 (×4): qty 0.4

## 2019-10-22 MED ORDER — PANTOPRAZOLE SODIUM 40 MG PO TBEC
40.0000 mg | DELAYED_RELEASE_TABLET | Freq: Every day | ORAL | Status: DC
  Administered 2019-10-22 – 2019-10-28 (×7): 40 mg via ORAL
  Filled 2019-10-22 (×7): qty 1

## 2019-10-22 MED ORDER — ONDANSETRON HCL 4 MG PO TABS: 4.0000 mg | ORAL_TABLET | Freq: Four times a day (QID) | ORAL | Status: DC | PRN

## 2019-10-22 MED ORDER — POTASSIUM CHLORIDE CRYS ER 20 MEQ PO TBCR
40.0000 meq | EXTENDED_RELEASE_TABLET | Freq: Once | ORAL | Status: AC
  Administered 2019-10-22: 40 meq via ORAL
  Filled 2019-10-22: qty 2

## 2019-10-22 MED ORDER — VITAMIN E 180 MG (400 UNIT) PO CAPS
400.0000 [IU] | ORAL_CAPSULE | ORAL | Status: DC
  Administered 2019-10-23 – 2019-10-28 (×5): 400 [IU] via ORAL
  Filled 2019-10-22 (×7): qty 1

## 2019-10-22 MED ORDER — PYRIDOSTIGMINE BROMIDE 60 MG PO TABS
60.0000 mg | ORAL_TABLET | Freq: Two times a day (BID) | ORAL | Status: DC
  Administered 2019-10-22 – 2019-10-24 (×5): 60 mg via ORAL
  Filled 2019-10-22 (×7): qty 1

## 2019-10-22 MED ORDER — ACETAMINOPHEN 325 MG PO TABS
650.0000 mg | ORAL_TABLET | Freq: Four times a day (QID) | ORAL | Status: DC | PRN
  Administered 2019-10-25: 650 mg via ORAL
  Filled 2019-10-22 (×3): qty 2

## 2019-10-22 MED ORDER — SODIUM CHLORIDE 0.9% FLUSH
10.0000 mL | INTRAVENOUS | Status: DC | PRN
  Administered 2019-10-23 – 2019-10-26 (×2): 10 mL

## 2019-10-22 MED ORDER — IMMUNE GLOBULIN (HUMAN) 5 GM/50ML IV SOLN
35.0000 g | INTRAVENOUS | Status: DC
  Filled 2019-10-22: qty 100

## 2019-10-22 MED ORDER — DILTIAZEM HCL-DEXTROSE 125-5 MG/125ML-% IV SOLN (PREMIX)
5.0000 mg/h | INTRAVENOUS | Status: DC
  Administered 2019-10-22: 5 mg/h via INTRAVENOUS
  Filled 2019-10-22: qty 125

## 2019-10-22 MED ORDER — SODIUM CHLORIDE 0.9 % IV SOLN: 2.0000 g | INTRAVENOUS | Status: DC

## 2019-10-22 MED ORDER — SUCRALFATE 1 G PO TABS
1.0000 g | ORAL_TABLET | Freq: Two times a day (BID) | ORAL | Status: DC
  Administered 2019-10-23 – 2019-10-28 (×9): 1 g via ORAL
  Filled 2019-10-22 (×9): qty 1

## 2019-10-22 MED ORDER — DIPHENHYDRAMINE HCL 50 MG/ML IJ SOLN: 25.0000 mg | Freq: Once | INTRAMUSCULAR | Status: DC | PRN

## 2019-10-22 MED ORDER — ACETAMINOPHEN 650 MG RE SUPP: 650.0000 mg | Freq: Four times a day (QID) | RECTAL | Status: DC | PRN

## 2019-10-22 MED ORDER — BIOTIN 10000 MCG PO TABS: 10000.0000 ug | ORAL_TABLET | Freq: Every day | ORAL | Status: DC

## 2019-10-22 MED ORDER — LEVOTHYROXINE SODIUM 50 MCG PO TABS
75.0000 ug | ORAL_TABLET | Freq: Every day | ORAL | Status: DC
  Administered 2019-10-23 – 2019-10-28 (×6): 75 ug via ORAL
  Filled 2019-10-22 (×3): qty 1
  Filled 2019-10-22 (×2): qty 2
  Filled 2019-10-22: qty 1

## 2019-10-22 MED ORDER — IMMUNE GLOBULIN (HUMAN) 10 GM/100ML IV SOLN
400.0000 mg/kg | INTRAVENOUS | Status: DC
  Filled 2019-10-22: qty 400

## 2019-10-22 MED ORDER — CALCIUM CITRATE-VITAMIN D 500-500 MG-UNIT PO CHEW
1.0000 | CHEWABLE_TABLET | Freq: Every day | ORAL | Status: DC
  Administered 2019-10-23 – 2019-10-28 (×6): 1 via ORAL
  Filled 2019-10-22 (×7): qty 1

## 2019-10-22 MED ORDER — CALCIUM GLUCONATE-NACL 1-0.675 GM/50ML-% IV SOLN
1.0000 g | Freq: Once | INTRAVENOUS | Status: AC
  Administered 2019-10-22: 1000 mg via INTRAVENOUS
  Filled 2019-10-22: qty 50

## 2019-10-22 MED ORDER — SODIUM CHLORIDE 0.9 % IV SOLN
1.0000 g | Freq: Once | INTRAVENOUS | Status: AC
  Administered 2019-10-22: 1 g via INTRAVENOUS
  Filled 2019-10-22: qty 10

## 2019-10-22 MED ORDER — SODIUM CHLORIDE 0.9 % IV SOLN
500.0000 mg | INTRAVENOUS | Status: DC
  Administered 2019-10-22: 500 mg via INTRAVENOUS
  Filled 2019-10-22: qty 500

## 2019-10-22 MED ORDER — FAMOTIDINE IN NACL 20-0.9 MG/50ML-% IV SOLN: 20.0000 mg | Freq: Once | INTRAVENOUS | Status: DC | PRN

## 2019-10-22 MED ORDER — SODIUM CHLORIDE 0.9 % IV SOLN
2.0000 g | INTRAVENOUS | Status: DC
  Administered 2019-10-22 – 2019-10-23 (×2): 2 g via INTRAVENOUS
  Filled 2019-10-22 (×3): qty 20

## 2019-10-22 MED ORDER — RISPERIDONE 1 MG PO TABS
2.0000 mg | ORAL_TABLET | Freq: Every day | ORAL | Status: DC
  Administered 2019-10-22 – 2019-10-24 (×2): 2 mg via ORAL
  Filled 2019-10-22 (×3): qty 2

## 2019-10-22 MED ORDER — B COMPLEX-C PO TABS
1.0000 | ORAL_TABLET | Freq: Every evening | ORAL | Status: DC
  Administered 2019-10-23 – 2019-10-27 (×4): 1 via ORAL
  Filled 2019-10-22 (×7): qty 1

## 2019-10-22 MED ORDER — SODIUM CHLORIDE 0.9 % IV SOLN: INTRAVENOUS | Status: DC

## 2019-10-22 MED ORDER — ASCORBIC ACID 500 MG PO TABS
1000.0000 mg | ORAL_TABLET | Freq: Every day | ORAL | Status: DC
  Administered 2019-10-22 – 2019-10-27 (×6): 1000 mg via ORAL
  Filled 2019-10-22 (×6): qty 2

## 2019-10-22 MED ORDER — DULOXETINE HCL 30 MG PO CPEP
30.0000 mg | ORAL_CAPSULE | Freq: Every day | ORAL | Status: DC
  Administered 2019-10-22 – 2019-10-24 (×3): 30 mg via ORAL
  Filled 2019-10-22 (×4): qty 1

## 2019-10-22 MED ORDER — SODIUM CHLORIDE 0.9% FLUSH
10.0000 mL | Freq: Two times a day (BID) | INTRAVENOUS | Status: DC
  Administered 2019-10-22 – 2019-10-28 (×10): 10 mL

## 2019-10-22 MED ORDER — CALCIUM-PHOSPHORUS-VITAMIN D 250-107-500 MG-MG-UNIT PO CHEW: CHEWABLE_TABLET | Freq: Every day | ORAL | Status: DC

## 2019-10-22 NOTE — ED Notes (Signed)
Immune globulin tolerated well by patient, VSS. Rate increased- 60 mg/kg/hr

## 2019-10-22 NOTE — H&P (Addendum)
History and Physical    ZULEY LUTTER FXT:[id number] DOB: [birth year] DOA: 10/22/2019  PCP: Einar Pheasant, MD   Patient coming from: Home  I have personally briefly reviewed patient's old medical records in [place]  Chief Complaint: Chest pain Most of the history was obtained from patient's sister and healthcare power of attorney, [name] who was at the bedside.  HPI: TANIKKA BRESNAN is a 69 y.o. female with medical history significant for hypertension, hypothyroidism, obstructive sleep apnea, myasthenia gravis, thymoma status post radiation therapy 4 months ago as well as renal cell carcinoma who presents to the emergency room for evaluation of shortness of breath associated with right-sided chest pain and a fever. Her sister states that she had been in her usual state of health until 1 day prior to admission when she developed shortness of breath which is unusual for her.  She has a cough which is nonproductive and was febrile at home.  She complained of pain mostly over the right breast.  She denies having any abdominal pain, no nausea, no vomiting, no diaphoresis, no palpitations, no dizziness or lightheadedness.  She denies having any urinary symptoms. In the ER she was noted to have a low-grade fever with a T-max of 100.27F, she was tachycardic with heart rate of 130 and tachypneic with respiratory rate of 24. She was also noted to have accessory muscle use. Venous blood gas pH 7.32, PCO2 54, PO2 44, O2 sat 75 Sodium 134 potassium 3.4 chloride 98 bicarb 24 BUN 14 creatinine 0.71 BNP 46, lactic acid 1.8, white cell count 13.8 with a left shift, hemoglobin 14.6, hematocrit 44.3, RDW 13.8, platelet count 150, TSH 3.859 CT angiogram of the chest shows no definite pulmonary embolism.  Moderate atelectasis at the left base.  A similar opacity in the para mediastinal right upper lobe is likely radiation related.  Regressed thymic mass. Chest x-ray reviewed by me shows patchy airspace  opacity in the left lung base. Twelve-lead EKG reviewed by me shows sinus tachycardia with PACs.   ED Course: Patient is a 69 year old female with a history of myasthenia gravis who presents to the ER for evaluation of chest pain.  She was noted to have a low-grade fever, she was tachycardic and tachypneic.  She also had elevated fibrin derivatives and had a CT angios which was negative for PE.  Venous blood gas was drawn due to her respiratory status and use of accessory muscles and shows mild uncompensated respiratory acidosis.  Her NIF in the ER was -20.  She received IV antibiotics in the ER for presumed infectious process.  She also received IV calcium.  ER provider had discussed with intensivist and patient will be admitted to stepdown for now.  Review of Systems: As per HPI otherwise 10 point review of systems negative.    Past Medical History:  Diagnosis Date  . Cancer of kidney ([place]) 2021  . Complication of anesthesia    Myasthenia gravis  . GERD (gastroesophageal reflux disease)   . History of seizure disorder   . Hypercholesterolemia   . Hypertension    no longer has it. wt loss  . Hypothyroidism   . Irritable bowel syndrome   . Myasthenia gravis ([place]) 2011  . OSA on CPAP   . Skin cancer 2017   Melanoma on neck    Past Surgical History:  Procedure Laterality Date  . ABDOMINAL HYSTERECTOMY  1990  . APPENDECTOMY  1999  . CHOLECYSTECTOMY  2013  . COLONOSCOPY WITH  PROPOFOL N/A 06/30/2017   Procedure: COLONOSCOPY WITH PROPOFOL;  Surgeon: Toledo, Benay Pike, MD;  Location: [facility] ENDOSCOPY;  Service: Gastroenterology;  Laterality: N/A;  . CYSTOSCOPY W/ URETERAL STENT PLACEMENT Left 08/17/2019   Procedure: CYSTOSCOPY WITH RETROGRADE PYELOGRAM/URETERAL STENT PLACEMENT;  Surgeon: Alexis Frock, MD;  Location: [facility];  Service: Urology;  Laterality: Left;  30 MINS  . ESOPHAGOGASTRODUODENOSCOPY (EGD) WITH PROPOFOL N/A 06/30/2017   Procedure: ESOPHAGOGASTRODUODENOSCOPY (EGD) WITH  PROPOFOL;  Surgeon: Toledo, Benay Pike, MD;  Location: [facility] ENDOSCOPY;  Service: Gastroenterology;  Laterality: N/A;  . IR RADIOLOGIST EVAL & MGMT  06/28/2019  . IR RADIOLOGIST EVAL & MGMT  09/14/2019  . RADIOFREQUENCY ABLATION Left 08/17/2019   Procedure: LEFT RENAL CRYO ABLATION;  Surgeon: Jacqulynn Cadet, MD;  Location: [facility];  Service: Anesthesiology;  Laterality: Left;  . TONSILLECTOMY  1959     reports that she has never smoked. She has never used smokeless tobacco. She reports that she does not drink alcohol and does not use drugs.  No Known Allergies  Family History  Problem Relation Age of Onset  . Colon cancer Maternal Grandfather        stomach/colon  . Heart disease Father        myocardial infarction  . Hyperlipidemia Father   . Hyperlipidemia Sister   . Diabetes Sister   . Bone cancer Other        cousin  . Alzheimer's disease Mother        maternal aunts  . Skin cancer Mother   . Myasthenia gravis Brother        ocular  . Skin cancer Brother   . Stroke Paternal Grandfather   . Breast cancer Neg Hx      Prior to Admission medications   Medication Sig Start Date End Date Taking? Authorizing Provider  DULoxetine (CYMBALTA) 30 MG capsule Take 1 capsule (30 mg total) by mouth daily. 09/07/19  Yes Earlie Server, MD  levothyroxine (SYNTHROID) 75 MCG tablet TAKE 1 TABLET BY MOUTH DAILY ON AN [place]. WAIT Cache OTHER MEDS Patient taking differently: Take 75 mcg by mouth daily before breakfast.  05/21/19  Yes Einar Pheasant, MD  pantoprazole (PROTONIX) 40 MG tablet Take 40 mg by mouth daily.  11/24/17  Yes [provider]  pyridostigmine (MESTINON) 60 MG tablet TAKE 1 TABLET BY MOUTH AT  8 AM, ONE AT LUNCH AND ONE AT 8PM 08/23/19  Yes Dohmeier, Asencion Partridge, MD  risperiDONE (RISPERDAL) 2 MG tablet Take 2 mg by mouth at bedtime.  07/08/18  Yes [provider]  sucralfate (CARAFATE) 1 g tablet Take 1 g by mouth 2 (two) times daily. (0800 & 1700)  05/25/17  Yes [provider]  tolterodine (DETROL LA) 4 MG 24 hr capsule TAKE 1 CAPSULE BY MOUTH ONCE EVERY MORNING Patient taking differently: Take 4 mg by mouth daily.  07/06/19  Yes Einar Pheasant, MD  Ascorbic Acid (VITAMIN C) 1000 MG tablet Take 1,000 mg by mouth at bedtime.    [provider]  B Complex-C (B-COMPLEX WITH VITAMIN C) tablet Take 1 tablet by mouth every evening.    [provider]  Biotin 10000 MCG TABS Take 10,000 mcg by mouth daily at 2 PM. (1430)    [provider]  Calcium-Phosphorus-Vitamin D (CITRACAL +D3 PO) Take 1 tablet by mouth daily.    [provider]  Garlic 0981 MG CAPS Take 1,000 mg by mouth in the morning, at noon, and at bedtime. (0800, 1430,  2000)    [provider]  Probiotic Product (PROBIOTIC MULTI-ENZYME PO) Take 1 capsule by mouth in the morning and at bedtime. Probiotic Multi-Enzyme Digestive Formula    [provider]  vitamin E 180 MG (400 UNITS) capsule Take 400 Units by mouth daily at 2 PM. 1430    [provider]    Physical Exam: Vitals:   10/23/19 0500 10/23/19 0530 10/23/19 0630 10/23/19 0700  BP: 131/71 128/69 125/76 126/76  Pulse: 74 78 86 82  Resp: 13 14 17 15   Temp:      TempSrc:      SpO2: 98% 98% 98% 98%  Weight:      Height:         Vitals:   10/23/19 0500 10/23/19 0530 10/23/19 0630 10/23/19 0700  BP: 131/71 128/69 125/76 126/76  Pulse: 74 78 86 82  Resp: 13 14 17 15   Temp:      TempSrc:      SpO2: 98% 98% 98% 98%  Weight:      Height:        Constitutional: NAD, alert and oriented x 3.  Acutely ill-appearing.  Noted to be in mild respiratory distress Eyes: PERRL, lids and conjunctivae normal ENMT: Mucous membranes are dry.  Neck: normal, supple, no masses, no thyromegaly Respiratory: Air entry in both lung fields bilaterally, no wheezing, no crackles. Normal respiratory effort. accessory muscle use. (Belly breathing) Cardiovascular:  Tachycardic, no murmurs / rubs / gallops. No extremity edema. 2+ pedal pulses. No carotid bruits.  Abdomen: no tenderness, no masses palpated. No hepatosplenomegaly. Bowel sounds positive.  Musculoskeletal: no clubbing / cyanosis. No joint deformity upper and lower extremities.  Skin: no rashes, lesions, ulcers.  Neurologic: No [place] focal neurologic deficit. Psychiatric: Normal mood and affect.   Labs on Admission: I have personally reviewed following labs and imaging studies  CBC: Recent Labs  Lab 10/21/19 2112 10/23/19 0222  WBC 13.8* 8.7  NEUTROABS 11.9*  --   HGB 14.6 12.0  HCT 44.3 36.8  MCV 91.9 93.6  PLT 150 355*   Basic Metabolic Panel: Recent Labs  Lab 10/21/19 2112 10/22/19 0757 10/23/19 0222  NA 134*  --  130*  K 3.4*  --  3.9  CL 98  --  98  CO2 24  --  26  GLUCOSE 185*  --  126*  BUN 14  --  15  CREATININE 0.71  --  0.72  CALCIUM 8.8*  --  8.3*  MG  --  2.1  --   PHOS  --  4.0  --    GFR: Estimated Creatinine Clearance: 71.3 mL/min (by C-G formula based on SCr of 0.72 mg/dL). Liver Function Tests: Recent Labs  Lab 10/21/19 2112  AST 24  ALT 19  ALKPHOS 59  BILITOT 0.9  PROT 7.3  ALBUMIN 3.9   No results for input(s): LIPASE, AMYLASE in the last 168 hours. No results for input(s): AMMONIA in the last 168 hours. Coagulation Profile: Recent Labs  Lab 10/21/19 2112 10/23/19 0222  INR 0.9 1.0   Cardiac Enzymes: No results for input(s): CKTOTAL, CKMB, CKMBINDEX, TROPONINI in the last 168 hours. BNP (last 3 results) No results for input(s): PROBNP in the last 8760 hours. HbA1C: No results for input(s): HGBA1C in the last 72 hours. CBG: No results for input(s): GLUCAP in the last 168 hours. Lipid Profile: No results for input(s): CHOL, HDL, LDLCALC, TRIG, CHOLHDL, LDLDIRECT in the last 72 hours. Thyroid Function Tests: Recent  Labs    10/22/19 0757  TSH 3.859   Anemia Panel: No results for input(s): VITAMINB12, FOLATE, FERRITIN, TIBC,  IRON, RETICCTPCT in the last 72 hours. Urine analysis:    Component Value Date/Time   COLORURINE AMBER (A) 10/22/2019 0834   APPEARANCEUR CLOUDY (A) 10/22/2019 0834   APPEARANCEUR Cloudy (A) 09/07/2019 1151   LABSPEC 1.040 (H) 10/22/2019 0834   LABSPEC 1.019 04/25/2011 0854   PHURINE 5.0 10/22/2019 0834   GLUCOSEU NEGATIVE 10/22/2019 0834   GLUCOSEU NEGATIVE 02/13/2016 1109   HGBUR NEGATIVE 10/22/2019 0834   BILIRUBINUR NEGATIVE 10/22/2019 0834   BILIRUBINUR neg 02/07/2015 1520   BILIRUBINUR Negative 04/25/2011 0854   KETONESUR NEGATIVE 10/22/2019 0834   PROTEINUR >=300 (A) 10/22/2019 0834   UROBILINOGEN 0.2 02/13/2016 1109   NITRITE NEGATIVE 10/22/2019 0834   LEUKOCYTESUR TRACE (A) 10/22/2019 0834   LEUKOCYTESUR 2+ 04/25/2011 0854    Radiological Exams on Admission: DG Chest 2 View  Result Date: 10/21/2019 CLINICAL DATA:  Sepsis EXAM: CHEST - 2 VIEW COMPARISON:  January 01, 2019 FINDINGS: The heart size and mediastinal contours are within normal limits. Overall shallow degree of aeration with patchy airspace opacity at the left lung base. The right lung is clear. No pleural effusion no acute osseous abnormality. IMPRESSION: Shallow degree of aeration with patchy airspace opacity at the left lung base which could be due to atelectasis and/or infectious etiology. Electronically Signed   By: Prudencio Pair M.D.   On: 10/21/2019 21:33   CT Angio Chest PE W/Cm &/Or Wo Cm  Result Date: 10/22/2019 CLINICAL DATA:  Right-sided chest pain.  Shortness of breath. EXAM: CT ANGIOGRAPHY CHEST WITH CONTRAST TECHNIQUE: Multidetector CT imaging of the chest was performed using the standard protocol during bolus administration of intravenous contrast. Multiplanar CT image reconstructions and MIPs were obtained to evaluate the vascular anatomy. CONTRAST:  122mL OMNIPAQUE IOHEXOL 350 MG/ML SOLN COMPARISON:  Chest CT 03/07/2019 FINDINGS: Cardiovascular: Cardiomegaly without pericardial effusion. Limited  atheromatous changes for age. Moderate to intermittently significant CTA degradation due to motion, bolus dispersion, and streak. No definite pulmonary emboli. Mediastinum/Nodes: Interval contraction of the anterior mediastinal mass with coarse calcifications. No progressive finding or adenopathy. Lungs/Pleura: Volume loss and bandlike opacity in the paramedian right upper lobe, suspect radiation fibrosis. Atelectasis at the left base. No edema, effusion, or pneumothorax Upper Abdomen: Large spleen, also seen on prior with 13 cm anterior to posterior span and 6 cm thickness. Musculoskeletal: No acute or aggressive finding Review of the MIP images confirms the above findings. IMPRESSION: 1. Multifactorial degradation of the CTA with no definite pulmonary embolism. 2. Moderate atelectasis at the left base. A similar opacity in the paramediastinal right upper lobe is likely radiation related. 3. Regressed thymic mass. No mediastinal adenopathy or pleural nodules. Electronically Signed   By: Monte Fantasia M.D.   On: 10/22/2019 08:24    EKG: Independently reviewed.  Sinus tachycardia Multiple PVCs  Assessment/Plan Principal Problem:   Sepsis ([place]) Active Problems:   Obesity (BMI 30-39.9)   Left renal mass   Hypertension   MG with exacerbation (myasthenia gravis) (HCC)   OSA (obstructive sleep apnea)   Hypothyroidism   Atrial fibrillation with RVR (HCC)    Sepsis ([facility]) As evidenced by low-grade fever with a T max of 100.84F, tachycardia with heart rate of 130 bpm and tachypnea with respiratory rate of 24 and increased work of breathing. She has a white cell count of 13,000, lactic acid of 1.8 and ??  Left lower lobe  infiltrate We will treat patient empirically with Rocephin and Zithromax for presumed community-acquired pneumonia IV fluid hydration Follow-up results of blood cultures   History of myasthenia gravis Continue pyridostigmine Patient noted to be in respiratory distress with use of  accessory muscles Arterial blood gas shows a mild uncompensated respiratory acidosis and her initial NIF was -20 We will place patient on BiPAP per RT to reduce work of breathing Monitor respiratory status closely as well as mental status   Hypothyroidism Continue Synthroid    Left renal mass Patient is status post cryoablation Follow-up with oncology as an outpatient    Obesity (BMI 37) Complicates overall prognosis and care   Atrial fibrillation with rapid ventricular rate New onset Notified by RN the patient was tachycardic with heart rate in the 150s Twelve-lead EKG showed A. fib with a rapid ventricular rate Patient denied having any chest pain but remained short of breath We will start patient on Cardizem infusion for rate control Patient's CHADS2VASC score is 3 and ideally she will require long-term anticoagulation as primary prophylaxis for an acute stroke. We will defer initiation of long-term anticoagulation to cardiology We will request cardiology consult    DVT prophylaxis: Lovenox Code Status: Full code Family Communication: Greater than 50% of time was spent discussing patient's condition and plan of care with her sister, [name] who is also her healthcare power of attorney.  All questions and concerns have been addressed.  She verbalizes understanding and agrees with the plan.  CODE STATUS was discussed and patient is a full code. Disposition Plan: Back to previous home environment Consults called: Neurology/Cardiology    Teleah Villamar MD [facility]     10/23/2019, 7:48 AM

## 2019-10-22 NOTE — Progress Notes (Signed)
NIF -20

## 2019-10-22 NOTE — ED Notes (Signed)
Pt awake, tolerating bipap, family asleep at bedside. Pt denies needs at this time. Ns and cardiazem infusing via iv without s/s of infiltration to sites. Call bell at right side, bed in locked position with siderails up. Pure wick in place.

## 2019-10-22 NOTE — Consult Note (Signed)
[facility]  CARDIOLOGY CONSULT NOTE  Patient ID: Madison Burns MRN: [id number] DOB/AGE: 04-22-1950 69 y.o.  Admit date: 10/22/2019 Referring Physician [facility] Primary Physician [facility] Primary Cardiologist Fath Reason for Consultation atrial fibrillation  HPI: 69 year old female referred for evaluation of atrial fibrillation.  The patient has a history of essential hypertension, myasthenia gravis, who presents with chief complaint of shortness of breath and fever.  X-ray revealed patchy airspace disease in left lung base.  CT angiogram did not reveal evidence for pulmonary embolus.  Admission labs notable for elevated white count of 13,800.  Lactic acid mildly elevated to 1.8.  Initial EKG revealed sinus tachycardia with PACs.  In the emergency room, the patient has had intermittent atrial fibrillation with rapid ventricular rate.  She was treated with diltiazem drip, currently in sinus rhythm.  Review of systems complete and found to be negative unless listed above     Past Medical History:  Diagnosis Date  . Cancer of kidney ([place]) 2021  . Complication of anesthesia    Myasthenia gravis  . GERD (gastroesophageal reflux disease)   . History of seizure disorder   . Hypercholesterolemia   . Hypertension    no longer has it. wt loss  . Hypothyroidism   . Irritable bowel syndrome   . Myasthenia gravis ([place]) 2011  . OSA on CPAP   . Skin cancer 2017   Melanoma on neck    Past Surgical History:  Procedure Laterality Date  . ABDOMINAL HYSTERECTOMY  1990  . APPENDECTOMY  1999  . CHOLECYSTECTOMY  2013  . COLONOSCOPY WITH PROPOFOL N/A 06/30/2017   Procedure: COLONOSCOPY WITH PROPOFOL;  Surgeon: Toledo, Benay Pike, MD;  Location: [facility] ENDOSCOPY;  Service: Gastroenterology;  Laterality: N/A;  . CYSTOSCOPY W/ URETERAL STENT PLACEMENT Left 08/17/2019   Procedure: CYSTOSCOPY WITH RETROGRADE PYELOGRAM/URETERAL STENT PLACEMENT;  Surgeon: Alexis Frock, MD;  Location: [facility];  Service:  Urology;  Laterality: Left;  30 MINS  . ESOPHAGOGASTRODUODENOSCOPY (EGD) WITH PROPOFOL N/A 06/30/2017   Procedure: ESOPHAGOGASTRODUODENOSCOPY (EGD) WITH PROPOFOL;  Surgeon: Toledo, Benay Pike, MD;  Location: [facility] ENDOSCOPY;  Service: Gastroenterology;  Laterality: N/A;  . IR RADIOLOGIST EVAL & MGMT  06/28/2019  . IR RADIOLOGIST EVAL & MGMT  09/14/2019  . RADIOFREQUENCY ABLATION Left 08/17/2019   Procedure: LEFT RENAL CRYO ABLATION;  Surgeon: Jacqulynn Cadet, MD;  Location: [facility];  Service: Anesthesiology;  Laterality: Left;  . TONSILLECTOMY  1959    (Not in a hospital admission)  Social History   Socioeconomic History  . Marital status: Single    Spouse name: Not on file  . Number of children: 0  . Years of education: Special Ed  . Highest education level: Not on file  Occupational History  . Occupation: Unemployed  Tobacco Use  . Smoking status: Never Smoker  . Smokeless tobacco: Never Used  Vaping Use  . Vaping Use: Never used  Substance and Sexual Activity  . Alcohol use: No    Alcohol/week: 0.0 standard drinks  . Drug use: No  . Sexual activity: Not Currently  Other Topics Concern  . Not on file  Social History Narrative   06/23/17 lives with sister, [name]   Regular exercise-no   Caffeine Use-yes   Social Determinants of Health   Financial Resource Strain:   . Difficulty of Paying Living Expenses: Not on file  Food Insecurity:   . Worried About Charity fundraiser in the Last Year: Not on file  . Ran Out of Food in  the Last Year: Not on file  Transportation Needs:   . Lack of Transportation (Medical): Not on file  . Lack of Transportation (Non-Medical): Not on file  Physical Activity:   . Days of Exercise per Week: Not on file  . Minutes of Exercise per Session: Not on file  Stress:   . Feeling of Stress : Not on file  Social Connections:   . Frequency of Communication with Friends and Family: Not on file  . Frequency of Social Gatherings with Friends and  Family: Not on file  . Attends Religious Services: Not on file  . Active Member of Clubs or Organizations: Not on file  . Attends Archivist Meetings: Not on file  . Marital Status: Not on file  Intimate Partner Violence:   . Fear of Current or Ex-Partner: Not on file  . Emotionally Abused: Not on file  . Physically Abused: Not on file  . Sexually Abused: Not on file    Family History  Problem Relation Age of Onset  . Colon cancer Maternal Grandfather        stomach/colon  . Heart disease Father        myocardial infarction  . Hyperlipidemia Father   . Hyperlipidemia Sister   . Diabetes Sister   . Bone cancer Other        cousin  . Alzheimer's disease Mother        maternal aunts  . Skin cancer Mother   . Myasthenia gravis Brother        ocular  . Skin cancer Brother   . Stroke Paternal Grandfather   . Breast cancer Neg Hx       Review of systems complete and found to be negative unless listed above      PHYSICAL EXAM  General: Well developed, well nourished, in no acute distress HEENT:  Normocephalic and atramatic Neck:  No JVD.  Lungs: Clear bilaterally to auscultation and percussion. Heart: HRRR . Normal S1 and S2 without gallops or murmurs.  Abdomen: Bowel sounds are positive, abdomen soft and non-tender  Msk:  Back normal, normal gait. Normal strength and tone for age. Extremities: No clubbing, cyanosis or edema.   Neuro: Alert and oriented X 3. Psych:  Good affect, responds appropriately  Labs:   Lab Results  Component Value Date   WBC 13.8 (H) 10/21/2019   HGB 14.6 10/21/2019   HCT 44.3 10/21/2019   MCV 91.9 10/21/2019   PLT 150 10/21/2019    Recent Labs  Lab 10/21/19 2112  NA 134*  K 3.4*  CL 98  CO2 24  BUN 14  CREATININE 0.71  CALCIUM 8.8*  PROT 7.3  BILITOT 0.9  ALKPHOS 59  ALT 19  AST 24  GLUCOSE 185*   No results found for: CKTOTAL, CKMB, CKMBINDEX, TROPONINI  Lab Results  Component Value Date   CHOL 197  12/14/2018   CHOL 184 08/06/2018   CHOL 193 03/30/2018   Lab Results  Component Value Date   HDL 36.80 (L) 12/14/2018   HDL 37.40 (L) 08/06/2018   HDL 35.90 (L) 03/30/2018   Lab Results  Component Value Date   LDLCALC 110 (H) 08/06/2018   LDLCALC 120 (H) 03/30/2018   LDLCALC 130 (H) 07/14/2017   Lab Results  Component Value Date   TRIG 210.0 (H) 12/14/2018   TRIG 182.0 (H) 08/06/2018   TRIG 187.0 (H) 03/30/2018   Lab Results  Component Value Date   CHOLHDL 5 12/14/2018  CHOLHDL 5 08/06/2018   CHOLHDL 5 03/30/2018   Lab Results  Component Value Date   LDLDIRECT 122.0 12/14/2018   LDLDIRECT 103.0 11/20/2017   LDLDIRECT 122.0 02/13/2016      Radiology: DG Chest 2 View  Result Date: 10/21/2019 CLINICAL DATA:  Sepsis EXAM: CHEST - 2 VIEW COMPARISON:  January 01, 2019 FINDINGS: The heart size and mediastinal contours are within normal limits. Overall shallow degree of aeration with patchy airspace opacity at the left lung base. The right lung is clear. No pleural effusion no acute osseous abnormality. IMPRESSION: Shallow degree of aeration with patchy airspace opacity at the left lung base which could be due to atelectasis and/or infectious etiology. Electronically Signed   By: Prudencio Pair M.D.   On: 10/21/2019 21:33   CT Angio Chest PE W/Cm &/Or Wo Cm  Result Date: 10/22/2019 CLINICAL DATA:  Right-sided chest pain.  Shortness of breath. EXAM: CT ANGIOGRAPHY CHEST WITH CONTRAST TECHNIQUE: Multidetector CT imaging of the chest was performed using the standard protocol during bolus administration of intravenous contrast. Multiplanar CT image reconstructions and MIPs were obtained to evaluate the vascular anatomy. CONTRAST:  132mL OMNIPAQUE IOHEXOL 350 MG/ML SOLN COMPARISON:  Chest CT 03/07/2019 FINDINGS: Cardiovascular: Cardiomegaly without pericardial effusion. Limited atheromatous changes for age. Moderate to intermittently significant CTA degradation due to motion, bolus  dispersion, and streak. No definite pulmonary emboli. Mediastinum/Nodes: Interval contraction of the anterior mediastinal mass with coarse calcifications. No progressive finding or adenopathy. Lungs/Pleura: Volume loss and bandlike opacity in the paramedian right upper lobe, suspect radiation fibrosis. Atelectasis at the left base. No edema, effusion, or pneumothorax Upper Abdomen: Large spleen, also seen on prior with 13 cm anterior to posterior span and 6 cm thickness. Musculoskeletal: No acute or aggressive finding Review of the MIP images confirms the above findings. IMPRESSION: 1. Multifactorial degradation of the CTA with no definite pulmonary embolism. 2. Moderate atelectasis at the left base. A similar opacity in the paramediastinal right upper lobe is likely radiation related. 3. Regressed thymic mass. No mediastinal adenopathy or pleural nodules. Electronically Signed   By: Monte Fantasia M.D.   On: 10/22/2019 08:24    EKG: ECG with premature atrial contractions  ASSESSMENT AND PLAN:   1.  Paroxysmal atrial fibrillation with rapid ventricular rate, in the setting of left lower lobe pneumonia and sepsis, currently in sinus rhythm on diltiazem drip 2.  Lower lobe pneumonia/sepsis  Recommendations  1.  Agree with current therapy 2.  Defer chronic anticoagulation for atrial fibrillation at this time 3.  Agree with diltiazem drip, transition to p.o. Cardizem when clinically stable 4.  Review 2D echocardiogram  Signed: Isaias Cowman MD,PhD, [facility] 10/22/2019, 4:29 PM

## 2019-10-22 NOTE — ED Notes (Signed)
Pt started having tachy episodes after calcium started.  Medication paused and discussed with dr Tamala Julian, calcium stopped. New EKG done.

## 2019-10-22 NOTE — ED Notes (Signed)
RT called for NIF.

## 2019-10-22 NOTE — ED Notes (Signed)
Pt tolerating immune globulin well. VSS. Rate increased to 240mg /kg/hr.

## 2019-10-22 NOTE — ED Notes (Signed)
Returned from CT scan.

## 2019-10-22 NOTE — ED Notes (Addendum)
Immune globulin started at 1739 - Start 30 mg/kg/hr  VSS- bp 109/72, hr 108, resp 18, O2 99% on bipap.

## 2019-10-22 NOTE — Progress Notes (Signed)
CODE SEPSIS - PHARMACY COMMUNICATION  **Broad Spectrum Antibiotics should be administered within 1 hour of Sepsis diagnosis**  Time Code Sepsis Called/Page Received: 9485  Antibiotics Ordered: Ceftriaxone, azithromcyin  Time of 1st antibiotic administration: 0845  Additional action taken by pharmacy: N/A  If necessary, Name of Provider/Nurse Contacted: [place], PharmD Pharmacy Resident  10/22/2019 10:35 AM

## 2019-10-22 NOTE — Consult Note (Signed)
[name] Requesting Physician: Dr. Francine Graven    Chief Complaint: Shortness of breath   History obtained from: Patient and Chart     HPI:                                                                                                                                       Madison Burns is a 69 y.o. female with past medical history significant for myasthenia gravis status post resection of thymoma, hypothyroidism, obstructive sleep apnea, renal cell carcinoma with shortness of breath and chest pain associated with low-grade fever.  Symptoms started yesterday around 8 PM according to her sister.  She subsequently started getting short of breath and also had a cough and fever.  On arrival to emergency department patient noted to have temperature of 100.2.  She also had accessory muscle use with heart rate in the 20s.  PCO2 was 54 on VBG with pH of 7.32  NIFwas greater than -20.  CT chest was obtained to rule out PE.  Neurology was consulted due to shortness of breath and concern for myasthenia gravis exacerbation.  Patient sees Dr. Brett Fairy at [facility] neurology for myasthenia gravis, recently weaned off methotrexate.  Currently only on pyridostigmine.    Past Medical History:  Diagnosis Date  . Cancer of kidney ([place]) 2021  . Complication of anesthesia    Myasthenia gravis  . GERD (gastroesophageal reflux disease)   . History of seizure disorder   . Hypercholesterolemia   . Hypertension    no longer has it. wt loss  . Hypothyroidism   . Irritable bowel syndrome   . Myasthenia gravis ([place]) 2011  . OSA on CPAP   . Skin cancer 2017   Melanoma on neck    Past Surgical History:  Procedure Laterality Date  . ABDOMINAL HYSTERECTOMY  1990  . APPENDECTOMY  1999  . CHOLECYSTECTOMY  2013  . COLONOSCOPY WITH PROPOFOL N/A 06/30/2017   Procedure: COLONOSCOPY WITH PROPOFOL;  Surgeon: Toledo, Benay Pike, MD;  Location: [facility] ENDOSCOPY;  Service: Gastroenterology;  Laterality: N/A;  . CYSTOSCOPY W/ URETERAL  STENT PLACEMENT Left 08/17/2019   Procedure: CYSTOSCOPY WITH RETROGRADE PYELOGRAM/URETERAL STENT PLACEMENT;  Surgeon: Alexis Frock, MD;  Location: [facility];  Service: Urology;  Laterality: Left;  30 MINS  . ESOPHAGOGASTRODUODENOSCOPY (EGD) WITH PROPOFOL N/A 06/30/2017   Procedure: ESOPHAGOGASTRODUODENOSCOPY (EGD) WITH PROPOFOL;  Surgeon: Toledo, Benay Pike, MD;  Location: [facility] ENDOSCOPY;  Service: Gastroenterology;  Laterality: N/A;  . IR RADIOLOGIST EVAL & MGMT  06/28/2019  . IR RADIOLOGIST EVAL & MGMT  09/14/2019  . RADIOFREQUENCY ABLATION Left 08/17/2019   Procedure: LEFT RENAL CRYO ABLATION;  Surgeon: Jacqulynn Cadet, MD;  Location: [facility];  Service: Anesthesiology;  Laterality: Left;  . TONSILLECTOMY  1959    Family History  Problem Relation Age of Onset  . Colon cancer Maternal Grandfather        stomach/colon  . Heart  disease Father        myocardial infarction  . Hyperlipidemia Father   . Hyperlipidemia Sister   . Diabetes Sister   . Bone cancer Other        cousin  . Alzheimer's disease Mother        maternal aunts  . Skin cancer Mother   . Myasthenia gravis Brother        ocular  . Skin cancer Brother   . Stroke Paternal Grandfather   . Breast cancer Neg Hx    Social History:  reports that she has never smoked. She has never used smokeless tobacco. She reports that she does not drink alcohol and does not use drugs.  Allergies: No Known Allergies  Medications:                                                                                                                        I reviewed home medications   ROS:                                                                                                                                     14 systems reviewed and negative except above   Examination:                                                                                                      General: Appears well-developed and well-nourished.  Psych: Affect  appropriate to situation Eyes: No scleral injection HENT: No OP obstrucion Head: Normocephalic.  Cardiovascular: Normal rate and regular rhythm.  Respiratory: Increased effort of breathing, unable to count to 20 with single breath GI: Soft.  No distension. There is no tenderness.  Skin: WDI    Neurological Examination Mental Status: Alert, oriented, thought content appropriate.  Speech fluent without evidence of aphasia. Able to follow 3 step commands without difficulty. Cranial Nerves: II: Visual fields grossly normal,  III,IV, VI: ptosis not present, extra-ocular motions intact bilaterally, pupils equal, round, reactive  to light and accommodation V,VII: smile symmetric, facial light touch sensation normal bilaterally VIII: hearing normal bilaterally IX,X: uvula rises symmetrically XI: bilateral shoulder shrug XII: midline tongue extension Motor: 4/ 5 strength in all 4 extremities with poor effort Tone and bulk:normal tone throughout; no atrophy noted Sensory: Pinprick and light touch intact throughout, bilaterally Deep Tendon Reflexes: 2+ and symmetric throughout Plantars: Right: downgoing   Left: downgoing Cerebellar: normal finger-to-nose      Lab Results: Basic Metabolic Panel: Recent Labs  Lab 10/21/19 June 09, 2110 10/22/19 0757  NA 134*  --   K 3.4*  --   CL 98  --   CO2 24  --   GLUCOSE 185*  --   BUN 14  --   CREATININE 0.71  --   CALCIUM 8.8*  --   MG  --  2.1  PHOS  --  4.0    CBC: Recent Labs  Lab 10/21/19 2112  WBC 13.8*  NEUTROABS 11.9*  HGB 14.6  HCT 44.3  MCV 91.9  PLT 150    Coagulation Studies: Recent Labs    10/21/19 06-09-10  LABPROT 11.9  INR 0.9    Imaging: DG Chest 2 View  Result Date: 10/21/2019 CLINICAL DATA:  Sepsis EXAM: CHEST - 2 VIEW COMPARISON:  January 01, 2019 FINDINGS: The heart size and mediastinal contours are within normal limits. Overall shallow degree of aeration with patchy airspace opacity at the left lung base. The  right lung is clear. No pleural effusion no acute osseous abnormality. IMPRESSION: Shallow degree of aeration with patchy airspace opacity at the left lung base which could be due to atelectasis and/or infectious etiology. Electronically Signed   By: Prudencio Pair M.D.   On: 10/21/2019 21:33   CT Angio Chest PE W/Cm &/Or Wo Cm  Result Date: 10/22/2019 CLINICAL DATA:  Right-sided chest pain.  Shortness of breath. EXAM: CT ANGIOGRAPHY CHEST WITH CONTRAST TECHNIQUE: Multidetector CT imaging of the chest was performed using the standard protocol during bolus administration of intravenous contrast. Multiplanar CT image reconstructions and MIPs were obtained to evaluate the vascular anatomy. CONTRAST:  140mL OMNIPAQUE IOHEXOL 350 MG/ML SOLN COMPARISON:  Chest CT 03/07/2019 FINDINGS: Cardiovascular: Cardiomegaly without pericardial effusion. Limited atheromatous changes for age. Moderate to intermittently significant CTA degradation due to motion, bolus dispersion, and streak. No definite pulmonary emboli. Mediastinum/Nodes: Interval contraction of the anterior mediastinal mass with coarse calcifications. No progressive finding or adenopathy. Lungs/Pleura: Volume loss and bandlike opacity in the paramedian right upper lobe, suspect radiation fibrosis. Atelectasis at the left base. No edema, effusion, or pneumothorax Upper Abdomen: Large spleen, also seen on prior with 13 cm anterior to posterior span and 6 cm thickness. Musculoskeletal: No acute or aggressive finding Review of the MIP images confirms the above findings. IMPRESSION: 1. Multifactorial degradation of the CTA with no definite pulmonary embolism. 2. Moderate atelectasis at the left base. A similar opacity in the paramediastinal right upper lobe is likely radiation related. 3. Regressed thymic mass. No mediastinal adenopathy or pleural nodules. Electronically Signed   By: Monte Fantasia M.D.   On: 10/22/2019 08:24     I have reviewed the above imaging :     ASSESSMENT AND PLAN   53-year female with myasthenia gravis status post thymoma resection, renal cancer presents to emergency department with shortness of breath, fever.  Sepsis work-up being completed by medicine team.  CT chest negative for PE or pneumonia-given shortness of breath and easy fatigability-concern for myasthenia exacerbation in the setting  of sepsis.  Myasthenia gravis exacerbation   Recommendations -Start IVIG 400mg /kg x5 days -Continue Mestinon 60 twice daily -Continue to evaluate and treat sepsis, avoid fluroquinolones and macrolides -Start prednisone in 1 to 2 days -Check NIF every 6 hours -CCM consult to assess breathing -Close neuro checks and monitoring of airway    Tanaia Hawkey [facility] Pager Number [phone]

## 2019-10-22 NOTE — ED Provider Notes (Signed)
[facility] Emergency Department Provider Note  ____________________________________________   First MD Initiated Contact with Patient 10/22/19 [phone]     (approximate)  I have reviewed the triage vital signs and the nursing notes.   HISTORY  Chief Complaint Chest Pain   HPI Madison Burns is a 69 y.o. female with a past medical history of HTN, HDL, hypothyroidism, OSA, IBS, myasthenia gravis on pyridostigmine, thymoma status post radiation therapy 4 months ago and renal cancer not currently undergoing chemo who presents accompanied by her sister for assessment of some shortness of breath associate with right-sided chest pain and a tactile fever with came on yesterday.  Patient states both her shortness of breath and chest pain slightly improved while she has been waiting in the emergency room.  He was accompanied by her sister who assist in providing some of the history as somewhat reticent historian.  She has not had any recent falls or injuries, cough, vomiting, diarrhea, dysuria, rash, or other acute sick symptoms.  She was seen by her radiation oncologist a couple of days ago for a follow-up appointment and was told she was doing fine from a cancer perspective at that time.  She has been taking all of her medications.  She does not normally wear oxygen at baseline.  No clear alleviating aggravating factors.  No prior similar episodes.         Past Medical History:  Diagnosis Date  . Cancer of kidney ([place]) 2021  . Complication of anesthesia    Myasthenia gravis  . GERD (gastroesophageal reflux disease)   . History of seizure disorder   . Hypercholesterolemia   . Hypertension    no longer has it. wt loss  . Hypothyroidism   . Irritable bowel syndrome   . Myasthenia gravis ([place]) 2011  . OSA on CPAP   . Skin cancer 2017   Melanoma on neck    Patient Active Problem List   Diagnosis Date Noted  . Sepsis ([place]) 10/22/2019  . Left renal mass 08/17/2019   . Left shoulder pain 07/18/2019  . Mediastinal mass 04/11/2019  . Renal cell cancer, left ([place]) 03/09/2019  . Thymoma 03/09/2019  . Chest pain 12/05/2018  . Fullness of breast 11/30/2018  . History of seizure disorder 01/05/2018  . Gastroesophagitis 07/02/2017  . Cough 04/25/2017  . Epilepsy, generalized, convulsive ([place]) 02/23/2017  . OSA (obstructive sleep apnea) 02/23/2017  . Mood disorder ([place]) 10/01/2016  . Pain in right toe(s) 06/17/2015  . MG (myasthenia gravis) ([place]) 06/06/2015  . OSA on CPAP 06/06/2015  . Dysuria 02/12/2015  . Goals of care, counseling/discussion 06/12/2014  . Obstructive sleep apnea syndrome 05/03/2014  . MG with exacerbation (myasthenia gravis) ([place]) 04/10/2014  . Hypersomnia with sleep apnea 04/10/2014  . Obesity (BMI 30-39.9) 01/29/2014  . Hyperglycemia 01/29/2014  . Abdominal pain 01/29/2014  . Melanoma ([place]) 01/23/2013  . Hypercholesterolemia 12/21/2011  . Hypertension 12/21/2011  . Myasthenia gravis ([place]) 12/21/2011  . Seizure disorder ([place]) 12/21/2011  . Irritable bowel 12/21/2011    Past Surgical History:  Procedure Laterality Date  . ABDOMINAL HYSTERECTOMY  1990  . APPENDECTOMY  1999  . CHOLECYSTECTOMY  2013  . COLONOSCOPY WITH PROPOFOL N/A 06/30/2017   Procedure: COLONOSCOPY WITH PROPOFOL;  Surgeon: Toledo, Benay Pike, MD;  Location: [facility] ENDOSCOPY;  Service: Gastroenterology;  Laterality: N/A;  . CYSTOSCOPY W/ URETERAL STENT PLACEMENT Left 08/17/2019   Procedure: CYSTOSCOPY WITH RETROGRADE PYELOGRAM/URETERAL STENT PLACEMENT;  Surgeon: Alexis Frock, MD;  Location: [facility];  Service: Urology;  Laterality: Left;  30 MINS  . ESOPHAGOGASTRODUODENOSCOPY (EGD) WITH PROPOFOL N/A 06/30/2017   Procedure: ESOPHAGOGASTRODUODENOSCOPY (EGD) WITH PROPOFOL;  Surgeon: Toledo, Benay Pike, MD;  Location: [facility] ENDOSCOPY;  Service: Gastroenterology;  Laterality: N/A;  . IR RADIOLOGIST EVAL & MGMT  06/28/2019  . IR RADIOLOGIST EVAL & MGMT  09/14/2019  .  RADIOFREQUENCY ABLATION Left 08/17/2019   Procedure: LEFT RENAL CRYO ABLATION;  Surgeon: Jacqulynn Cadet, MD;  Location: [facility] ORS;  Service: Anesthesiology;  Laterality: Left;  . TONSILLECTOMY  1959    Prior to Admission medications   Medication Sig Start Date End Date Taking? Authorizing Provider  DULoxetine (CYMBALTA) 30 MG capsule Take 1 capsule (30 mg total) by mouth daily. 09/07/19  Yes Earlie Server, MD  levothyroxine (SYNTHROID) 75 MCG tablet TAKE 1 TABLET BY MOUTH DAILY ON AN [place]. WAIT [place] OTHER MEDS Patient taking differently: Take 75 mcg by mouth daily before breakfast.  05/21/19  Yes Einar Pheasant, MD  pantoprazole (PROTONIX) 40 MG tablet Take 40 mg by mouth daily.  11/24/17  Yes [provider]  pyridostigmine (MESTINON) 60 MG tablet TAKE 1 TABLET BY MOUTH AT  8 AM, ONE AT LUNCH AND ONE AT 8PM 08/23/19  Yes Dohmeier, Asencion Partridge, MD  risperiDONE (RISPERDAL) 2 MG tablet Take 2 mg by mouth at bedtime.  07/08/18  Yes [provider]  sucralfate (CARAFATE) 1 g tablet Take 1 g by mouth 2 (two) times daily. (0800 & 1700) 05/25/17  Yes [provider]  tolterodine (DETROL LA) 4 MG 24 hr capsule TAKE 1 CAPSULE BY MOUTH ONCE EVERY MORNING Patient taking differently: Take 4 mg by mouth daily.  07/06/19  Yes Einar Pheasant, MD  Ascorbic Acid (VITAMIN C) 1000 MG tablet Take 1,000 mg by mouth at bedtime.    [provider]  B Complex-C (B-COMPLEX WITH VITAMIN C) tablet Take 1 tablet by mouth every evening.    [provider]  Biotin 10000 MCG TABS Take 10,000 mcg by mouth daily at 2 PM. (1430)    [provider]  Calcium-Phosphorus-Vitamin D (CITRACAL +D3 PO) Take 1 tablet by mouth daily.    [provider]  Garlic 8101 MG CAPS Take 1,000 mg by mouth in the morning, at noon, and at bedtime. (0800, 1430, 2000)    [provider]  Probiotic Product (PROBIOTIC MULTI-ENZYME PO) Take 1 capsule by mouth in the morning  and at bedtime. Probiotic Multi-Enzyme Digestive Formula    [provider]  vitamin E 180 MG (400 UNITS) capsule Take 400 Units by mouth daily at 2 PM. 1430    [provider]    Allergies Patient has no known allergies.  Family History  Problem Relation Age of Onset  . Colon cancer Maternal Grandfather        stomach/colon  . Heart disease Father        myocardial infarction  . Hyperlipidemia Father   . Hyperlipidemia Sister   . Diabetes Sister   . Bone cancer Other        cousin  . Alzheimer's disease Mother        maternal aunts  . Skin cancer Mother   . Myasthenia gravis Brother        ocular  . Skin cancer Brother   . Stroke Paternal Grandfather   . Breast cancer Neg Hx     Social History Social History   Tobacco Use  . Smoking status: Never Smoker  .  Smokeless tobacco: Never Used  Vaping Use  . Vaping Use: Never used  Substance Use Topics  . Alcohol use: No    Alcohol/week: 0.0 standard drinks  . Drug use: No    Review of Systems  Review of Systems  Constitutional: Negative for chills and fever.  HENT: Negative for sore throat.   Eyes: Negative for pain.  Respiratory: Positive for shortness of breath. Negative for cough and stridor.   Cardiovascular: Positive for chest pain.  Gastrointestinal: Negative for vomiting.  Skin: Negative for rash.  Neurological: Negative for seizures, loss of consciousness and headaches.  Psychiatric/Behavioral: Negative for suicidal ideas.  All other systems reviewed and are negative.     ____________________________________________   PHYSICAL EXAM:  VITAL SIGNS: ED Triage Vitals  Enc Vitals Group     BP 10/21/19 2030 (!) 158/91     Pulse Rate 10/21/19 2030 (!) 130     Resp 10/21/19 2030 (!) 24     Temp 10/21/19 2030 100.2 F (37.9 C)     Temp Source 10/21/19 2030 Oral     SpO2 10/21/19 2030 91 %     Weight 10/21/19 2036 204 lb (92.5 kg)     Height 10/21/19 2036 5\' 2"  (1.575 m)     Head  Circumference --      Peak Flow --      Pain Score --      Pain Loc --      Pain Edu? --      Excl. in [place]? --    Vitals:   10/22/19 0900 10/22/19 0930  BP: (!) 142/102 (!) 156/86  Pulse: 100 76  Resp: 18 15  Temp:    SpO2: 96% 98%   Physical Exam Vitals and nursing note reviewed.  Constitutional:      General: She is not in acute distress.    Appearance: Normal appearance. She is well-developed and normal weight.  HENT:     Head: Normocephalic and atraumatic.     Right Ear: External ear normal.     Left Ear: External ear normal.     Nose: Nose normal.     Mouth/Throat:     Mouth: Mucous membranes are moist.  Eyes:     Conjunctiva/sclera: Conjunctivae normal.  Cardiovascular:     Rate and Rhythm: Regular rhythm. Tachycardia present.     Pulses: Normal pulses.     Heart sounds: No murmur heard.   Pulmonary:     Effort: Tachypnea and accessory muscle usage present. No respiratory distress.     Breath sounds: Normal breath sounds.  Abdominal:     Palpations: Abdomen is soft.     Tenderness: There is no abdominal tenderness.  Musculoskeletal:     Cervical back: Neck supple.  Skin:    General: Skin is warm and dry.     Capillary Refill: Capillary refill takes less than 2 seconds.  Neurological:     Mental Status: She is alert and oriented to person, place, and time.  Psychiatric:        Mood and Affect: Mood normal.     PERRLA.  EOMI.  Patient has no fatigability on sustained upward gaze of 30 seconds.  Cranial nerves II through XII grossly intact. ____________________________________________   LABS (all labs ordered are listed, but only abnormal results are displayed)  Labs Reviewed  COMPREHENSIVE METABOLIC PANEL - Abnormal; Notable for the following components:      Result Value   Sodium 134 (*)  Potassium 3.4 (*)    Glucose, Bld 185 (*)    Calcium 8.8 (*)    All other components within normal limits  CBC WITH DIFFERENTIAL/PLATELET - Abnormal; Notable  for the following components:   WBC 13.8 (*)    Neutro Abs 11.9 (*)    All other components within normal limits  URINALYSIS, COMPLETE (UACMP) WITH MICROSCOPIC - Abnormal; Notable for the following components:   Color, Urine AMBER (*)    APPearance CLOUDY (*)    Specific Gravity, Urine 1.040 (*)    Protein, ur >=300 (*)    Leukocytes,Ua TRACE (*)    All other components within normal limits  FIBRIN DERIVATIVES D-DIMER ([facility] ONLY) - Abnormal; Notable for the following components:   Fibrin derivatives D-dimer ([facility]) 7,654.65 (*)    All other components within normal limits  CULTURE, BLOOD (ROUTINE X 2)  CULTURE, BLOOD (ROUTINE X 2)  SARS CORONAVIRUS 2 BY RT PCR (HOSPITAL ORDER, [place] [facility])  URINE CULTURE  LACTIC ACID, PLASMA  LACTIC ACID, PLASMA  PROTIME-INR  BRAIN NATRIURETIC PEPTIDE  MAGNESIUM  PROCALCITONIN  BLOOD GAS, VENOUS  PHOSPHORUS  TSH  APTT  HIV ANTIBODY (ROUTINE TESTING W REFLEX)  TROPONIN I (HIGH SENSITIVITY)  TROPONIN I (HIGH SENSITIVITY)   ____________________________________________  EKG  Sinus tachycardia with a ventricular rate of 132, left axis deviation, unremarkable intervals, and poor tracing in multiple leads with nonspecific ST changes in the inferior leads no other clear evidence of acute ischemia or other significant change. ____________________________________________  RADIOLOGY  ED MD interpretation: No pneumothorax, effusion, or significant pulmonary edema.  Official radiology report(s): DG Chest 2 View  Result Date: 10/21/2019 CLINICAL DATA:  Sepsis EXAM: CHEST - 2 VIEW COMPARISON:  January 01, 2019 FINDINGS: The heart size and mediastinal contours are within normal limits. Overall shallow degree of aeration with patchy airspace opacity at the left lung base. The right lung is clear. No pleural effusion no acute osseous abnormality. IMPRESSION: Shallow degree of aeration with patchy airspace opacity at the left lung  base which could be due to atelectasis and/or infectious etiology. Electronically Signed   By: Prudencio Pair M.D.   On: 10/21/2019 21:33   CT Angio Chest PE W/Cm &/Or Wo Cm  Result Date: 10/22/2019 CLINICAL DATA:  Right-sided chest pain.  Shortness of breath. EXAM: CT ANGIOGRAPHY CHEST WITH CONTRAST TECHNIQUE: Multidetector CT imaging of the chest was performed using the standard protocol during bolus administration of intravenous contrast. Multiplanar CT image reconstructions and MIPs were obtained to evaluate the vascular anatomy. CONTRAST:  162mL OMNIPAQUE IOHEXOL 350 MG/ML SOLN COMPARISON:  Chest CT 03/07/2019 FINDINGS: Cardiovascular: Cardiomegaly without pericardial effusion. Limited atheromatous changes for age. Moderate to intermittently significant CTA degradation due to motion, bolus dispersion, and streak. No definite pulmonary emboli. Mediastinum/Nodes: Interval contraction of the anterior mediastinal mass with coarse calcifications. No progressive finding or adenopathy. Lungs/Pleura: Volume loss and bandlike opacity in the paramedian right upper lobe, suspect radiation fibrosis. Atelectasis at the left base. No edema, effusion, or pneumothorax Upper Abdomen: Large spleen, also seen on prior with 13 cm anterior to posterior span and 6 cm thickness. Musculoskeletal: No acute or aggressive finding Review of the MIP images confirms the above findings. IMPRESSION: 1. Multifactorial degradation of the CTA with no definite pulmonary embolism. 2. Moderate atelectasis at the left base. A similar opacity in the paramediastinal right upper lobe is likely radiation related. 3. Regressed thymic mass. No mediastinal adenopathy or pleural nodules. Electronically Signed  By: Monte Fantasia M.D.   On: 10/22/2019 08:24    ____________________________________________   PROCEDURES  Procedure(s) performed (including Critical Care):  .1-3 Lead EKG Interpretation Performed by: Lucrezia Starch, MD Authorized  by: Lucrezia Starch, MD     Interpretation: normal     ECG rate assessment: normal     Rhythm: sinus rhythm     Ectopy: none     Conduction: normal       ____________________________________________   INITIAL IMPRESSION / ASSESSMENT AND PLAN / ED COURSE        Overall patient's history, exam, and ED work-up is concerning for likely infection with concern for pulmonary source given history of shortness of breath and right-sided chest discomfort as well as fever and elevated white blood cell count.  CTA chest is very motion degraded but shows no large PE or significant focal consolidation.  ECG shows sinus tachycardia and troponins are nonelevated and of low suspicion for ACS at this time.  Patient SNF is -20 she has no evidence of hypercarbic respiratory failure.  I did discuss patient's presentation and work-up with on-call neurologist who agreed with hospitalist admission for monitoring as well as with on-call ICU attending Dr. Louie Bun who agreed the patient was appropriate for stepdown at this time.  Patient empirically treated for possible pneumonia with below noted Rocephin.  Home thyroid-stimulating ordered.  Potassium repleted.  Will plan to admit to hospital service for further evaluation management.   ____________________________________________   FINAL CLINICAL IMPRESSION(S) / ED DIAGNOSES  Final diagnoses:  SOB (shortness of breath)  Chest pain, unspecified type  Tachycardia  Positive D dimer  Fever, unspecified fever cause  Hypokalemia  Hypocalcemia    Medications  acetaminophen (TYLENOL) 325 MG tablet (has no administration in time range)  pyridostigmine (MESTINON) tablet 60 mg (60 mg Oral Given 10/22/19 0829)  DULoxetine (CYMBALTA) DR capsule 30 mg (has no administration in time range)  risperiDONE (RISPERDAL) tablet 2 mg (has no administration in time range)  levothyroxine (SYNTHROID) tablet 75 mcg (has no administration in time range)  pantoprazole  (PROTONIX) EC tablet 40 mg (has no administration in time range)  sucralfate (CARAFATE) tablet 1 g (has no administration in time range)  fesoterodine (TOVIAZ) tablet 4 mg (has no administration in time range)  ascorbic acid (VITAMIN C) tablet 1,000 mg (has no administration in time range)  B-complex with vitamin C tablet 1 tablet (has no administration in time range)  Biotin TABS 10,000 mcg (has no administration in time range)  Calcium-Phosphorus-Vitamin D 250-107-500 MG-MG-UNIT CHEW (has no administration in time range)  vitamin E capsule 400 Units (has no administration in time range)  enoxaparin (LOVENOX) injection 40 mg (has no administration in time range)  0.9 %  sodium chloride infusion (has no administration in time range)  cefTRIAXone (ROCEPHIN) 2 g in sodium chloride 0.9 % 100 mL IVPB (has no administration in time range)  azithromycin (ZITHROMAX) 500 mg in sodium chloride 0.9 % 250 mL IVPB (has no administration in time range)  acetaminophen (TYLENOL) tablet 650 mg (has no administration in time range)    Or  acetaminophen (TYLENOL) suppository 650 mg (has no administration in time range)  ondansetron (ZOFRAN) tablet 4 mg (has no administration in time range)    Or  ondansetron (ZOFRAN) injection 4 mg (has no administration in time range)  acetaminophen (TYLENOL) tablet 650 mg (650 mg Oral Given 10/21/19 2044)  potassium chloride SA (KLOR-CON) CR tablet 40 mEq (40 mEq Oral  Given 10/22/19 0830)  calcium gluconate 1 g/ 50 mL sodium chloride IVPB (0 g Intravenous Stopped 10/22/19 0835)  iohexol (OMNIPAQUE) 350 MG/ML injection 100 mL (100 mLs Intravenous Contrast Given 10/22/19 0804)  cefTRIAXone (ROCEPHIN) 1 g in sodium chloride 0.9 % 100 mL IVPB (0 g Intravenous Stopped 10/22/19 0947)     ED Discharge Orders    None       Note:  This document was prepared using Dragon voice recognition software and may include unintentional dictation errors.   Lucrezia Starch, MD 10/22/19  1001

## 2019-10-22 NOTE — ED Notes (Signed)
Pt continues to tolerate immune globulin well. VSS. Rate increased to 120mg /kg/hr.

## 2019-10-23 ENCOUNTER — Inpatient Hospital Stay: Payer: PPO

## 2019-10-23 DIAGNOSIS — J9602 Acute respiratory failure with hypercapnia: Secondary | ICD-10-CM | POA: Insufficient documentation

## 2019-10-23 DIAGNOSIS — I4891 Unspecified atrial fibrillation: Secondary | ICD-10-CM | POA: Diagnosis present

## 2019-10-23 DIAGNOSIS — J189 Pneumonia, unspecified organism: Secondary | ICD-10-CM | POA: Diagnosis present

## 2019-10-23 LAB — CBC
HCT: 36.8 % (ref 36.0–46.0)
Hemoglobin: 12 g/dL (ref 12.0–15.0)
MCH: 30.5 pg (ref 26.0–34.0)
MCHC: 32.6 g/dL (ref 30.0–36.0)
MCV: 93.6 fL (ref 80.0–100.0)
Platelets: 129 10*3/uL — ABNORMAL LOW (ref 150–400)
RBC: 3.93 MIL/uL (ref 3.87–5.11)
RDW: 13.7 % (ref 11.5–15.5)
WBC: 8.7 10*3/uL (ref 4.0–10.5)
nRBC: 0 % (ref 0.0–0.2)

## 2019-10-23 LAB — BASIC METABOLIC PANEL
Anion gap: 6 (ref 5–15)
BUN: 15 mg/dL (ref 8–23)
CO2: 26 mmol/L (ref 22–32)
Calcium: 8.3 mg/dL — ABNORMAL LOW (ref 8.9–10.3)
Chloride: 98 mmol/L (ref 98–111)
Creatinine, Ser: 0.72 mg/dL (ref 0.44–1.00)
GFR calc Af Amer: >60 mL/min (ref >60)
GFR calc non Af Amer: >60 mL/min (ref >60)
Glucose, Bld: 126 mg/dL — ABNORMAL HIGH (ref 70–99)
Potassium: 3.9 mmol/L (ref 3.5–5.1)
Sodium: 130 mmol/L — ABNORMAL LOW (ref 135–145)

## 2019-10-23 LAB — URINE CULTURE: Culture: 10000 — AB

## 2019-10-23 LAB — PROCALCITONIN: Procalcitonin: 0.13 ng/mL

## 2019-10-23 LAB — PROTIME-INR
INR: 1 (ref 0.8–1.2)
Prothrombin Time: 13.1 seconds (ref 11.4–15.2)

## 2019-10-23 LAB — HIV ANTIBODY (ROUTINE TESTING W REFLEX): HIV Screen 4th Generation wRfx: NONREACTIVE

## 2019-10-23 MED ORDER — DOXYCYCLINE HYCLATE 100 MG PO TABS
100.0000 mg | ORAL_TABLET | Freq: Two times a day (BID) | ORAL | Status: DC
  Administered 2019-10-23 – 2019-10-28 (×11): 100 mg via ORAL
  Filled 2019-10-23 (×11): qty 1

## 2019-10-23 MED ORDER — DILTIAZEM HCL ER COATED BEADS 180 MG PO CP24
180.0000 mg | ORAL_CAPSULE | Freq: Every day | ORAL | Status: DC
  Administered 2019-10-23 – 2019-10-28 (×6): 180 mg via ORAL
  Filled 2019-10-23 (×7): qty 1

## 2019-10-23 MED ORDER — DILTIAZEM HCL 60 MG PO TABS
30.0000 mg | ORAL_TABLET | Freq: Four times a day (QID) | ORAL | Status: DC
  Administered 2019-10-23: 30 mg via ORAL
  Filled 2019-10-23: qty 1

## 2019-10-23 NOTE — ED Notes (Signed)
Pt taken off of BiPap for med administration and pt's O2 sats remained at 100% on 6L [place]. Per respiratory pt was tolerating 2L [place] well this morning and they thought she was on [place]. Pt currently on 4L O2 [place] and will continue to monitor. Pt's sister at bedside.

## 2019-10-23 NOTE — Progress Notes (Signed)
Reason for consult: Shortness of Breath   Subjective: Patient started bIPAP, tolerating nasal cannula 02.    ROS: negative except above  Examination  Vital signs in last 24 hours: Pulse Rate:  [74-118] 97 (09/05 1430) Resp:  [10-30] 22 (09/05 1430) BP: (94-149)/(63-97) 126/69 (09/05 1430) SpO2:  [93 %-100 %] 99 % (09/05 1430)  General: lying in bed CVS: pulse-normal rate and rhythm RS: breathing comfortably with assitance of BiPAP Extremities: normal   Neuro: MS: Alert, oriented, follows commands CN: pupils equal and reactive,  EOMI, face symmetric, tongue midline, normal sensation over face, Motor: 5/5 strength in all 4 extremities Reflexes:  plantars: flexor Coordination: normal Gait: not tested  Basic Metabolic Panel: Recent Labs  Lab 10/21/19 2110-05-12 10/22/19 0757 10/23/19 0222  NA 134*  --  130*  K 3.4*  --  3.9  CL 98  --  98  CO2 24  --  26  GLUCOSE 185*  --  126*  BUN 14  --  15  CREATININE 0.71  --  0.72  CALCIUM 8.8*  --  8.3*  MG  --  2.1  --   PHOS  --  4.0  --     CBC: Recent Labs  Lab 10/21/19 2110-05-12 10/23/19 0222  WBC 13.8* 8.7  NEUTROABS 11.9*  --   HGB 14.6 12.0  HCT 44.3 36.8  MCV 91.9 93.6  PLT 150 129*     Coagulation Studies: Recent Labs    10/21/19 05-12-2110 10/23/19 0222  LABPROT 11.9 13.1  INR 0.9 1.0    Imaging Reviewed:     ASSESSMENT AND PLAN  69 year old female with myasthenia gravis presenting with acute hypoxic and hypercarbic respiratory failure in the setting of sepsis.  CT chest negative for PE and pneumonia, however it appears patient does have LL pneumonia.  Patient empirically started on IVIG for myasthenia gravis exacerbation.   Acute hypoxic and hypercardbic respiratory failure Pneumonia Possible myasthenia gravis exacerbation    Recommendations -Start IVIG 400mg /kg x5 days -Continue Mestinon 60 twice daily -Continue to treat pneimonia -discuss with neurologist Dr. Beacher May on restarting  prednisone/methotrexate -Check NIF every 12 hours -Close neuro checks and monitoring of airway   Devone Tousley [facility] Pager Number [phone] For questions after 7pm please refer to AMION to reach the Neurologist on call

## 2019-10-23 NOTE — ED Notes (Signed)
Pt sleeping, resps unlabored and tolerating bipap well.

## 2019-10-23 NOTE — ED Notes (Signed)
Pt sleeping, tolerating bipap well. Will hold synthroid until pt awake.

## 2019-10-23 NOTE — Progress Notes (Signed)
NIF -20

## 2019-10-23 NOTE — ED Notes (Signed)
Pt provided dinner tray at bedside

## 2019-10-23 NOTE — ED Notes (Signed)
Patient sleeping soundly at this time.  Will hold meds to allow patient to sleep longer.  Will continue to monitor.

## 2019-10-23 NOTE — ED Notes (Signed)
RT has performed NIF. Pt awake, sister declines needs. Vss.

## 2019-10-23 NOTE — ED Notes (Signed)
Pt repositioned in bed for comfort. Additional warm blanket provided.

## 2019-10-23 NOTE — Consult Note (Addendum)
PULMONARY / CRITICAL CARE MEDICINE  Name: Madison Burns MRN: [id number] DOB: 1950-09-04    LOS: 1  Referring Provider: Dr Francine Graven  Reason for Referral: Acute hypoxic and hypercarbic respiratory failure.    Brief patient description: 69 y/o female with myasthenia gravis admitted with acute respiratory failure secondary to left lower lobe pneumonia requiring BiPAP.     SIGNIFICANT EVENTS: 10/22/2019: Admitted  HPI: This is a 69 year old female with a medical history as indicated below who presented to the ED with shortness of breath.  Of note, patient has myasthenia gravis, and was recently diagnosed with renal cell carcinoma.  She presented with shortness of breath, right-sided chest pain and fever.  Patient lives with her sister who is her caregiver.  History is obtained from both of them.  Per patient's sister, shortness of breath is progressively getting more gotten worse.  Shortness of breath is associated with fever, chest pain and a nonproductive cough.  She is not able to better characterize the chest pain.  She denies any vomiting, nausea, and urinary symptoms.  At the ED, patient was noted to be febrile with a low-grade fever of 100.2, tachycardic and tachypneic.  Her ED work-up was unremarkable except for mild hypercarbia on VBG, and a CT chest that shows left lower lobe pneumonia.  PCCM was consulted for further management  Past Medical History:  Diagnosis Date   Cancer of kidney ([place]) 3545   Complication of anesthesia    Myasthenia gravis   GERD (gastroesophageal reflux disease)    History of seizure disorder    Hypercholesterolemia    Hypertension    no longer has it. wt loss   Hypothyroidism    Irritable bowel syndrome    Myasthenia gravis ([place]) 2011   OSA on CPAP    Skin cancer 2017   Melanoma on neck   Past Surgical History:  Procedure Laterality Date   [place]  2013   COLONOSCOPY WITH  PROPOFOL N/A 06/30/2017   Procedure: COLONOSCOPY WITH PROPOFOL;  Surgeon: Toledo, Benay Pike, MD;  Location: [facility];  Service: Gastroenterology;  Laterality: N/A;   CYSTOSCOPY W/ URETERAL STENT PLACEMENT Left 08/17/2019   Procedure: CYSTOSCOPY WITH RETROGRADE PYELOGRAM/URETERAL STENT PLACEMENT;  Surgeon: Alexis Frock, MD;  Location: [facility];  Service: Urology;  Laterality: Left;  30 MINS   ESOPHAGOGASTRODUODENOSCOPY (EGD) WITH PROPOFOL N/A 06/30/2017   Procedure: ESOPHAGOGASTRODUODENOSCOPY (EGD) WITH PROPOFOL;  Surgeon: Toledo, Benay Pike, MD;  Location: [facility];  Service: Gastroenterology;  Laterality: N/A;   IR RADIOLOGIST EVAL & MGMT  06/28/2019   IR RADIOLOGIST EVAL & MGMT  09/14/2019   RADIOFREQUENCY ABLATION Left 08/17/2019   Procedure: LEFT RENAL CRYO ABLATION;  Surgeon: Jacqulynn Cadet, MD;  Location: [facility];  Service: Anesthesiology;  Laterality: Left;   TONSILLECTOMY  1959   No current facility-administered medications on file prior to encounter.   Current Outpatient Medications on File Prior to Encounter  Medication Sig   Ascorbic Acid (VITAMIN C) 1000 MG tablet Take 1,000 mg by mouth at bedtime.   B Complex-C (B-COMPLEX WITH VITAMIN C) tablet Take 1 tablet by mouth every evening.   benzonatate (TESSALON) 100 MG capsule Take 1 capsule (100 mg total) by mouth in the morning and at bedtime. MORNING & 1430   Biotin 10000 MCG TABS Take 10,000 mcg by mouth daily at 2 PM. (1430)   Calcium-Phosphorus-Vitamin D (CITRACAL +D3 PO) Take 1 tablet by mouth daily.  DULoxetine (CYMBALTA) 30 MG capsule Take 1 capsule (30 mg total) by mouth daily.   Garlic 7867 MG CAPS Take 1,000 mg by mouth in the morning, at noon, and at bedtime. (0800, 1430, 2000)   ketoconazole (NIZORAL) 2 % cream SMARTSIG:1 Application Topical 1 to 2 Times Daily   levothyroxine (SYNTHROID) 75 MCG tablet TAKE 1 TABLET BY MOUTH DAILY ON AN EMPTYSTOMACH. WAIT 30 MINUTES BEFORE TAKING OTHER MEDS (Patient  taking differently: Take 75 mcg by mouth daily before breakfast. )   pantoprazole (PROTONIX) 40 MG tablet Take 40 mg by mouth daily.    Probiotic Product (PROBIOTIC MULTI-ENZYME PO) Take 1 capsule by mouth in the morning and at bedtime. Probiotic Multi-Enzyme Digestive Formula   pyridostigmine (MESTINON) 60 MG tablet TAKE 1 TABLET BY MOUTH AT  8 AM, ONE AT LUNCH AND ONE AT 8PM   risperiDONE (RISPERDAL) 2 MG tablet Take 2 mg by mouth at bedtime.    sucralfate (CARAFATE) 1 g tablet Take 1 g by mouth 2 (two) times daily. (0800 & 1700)   tolterodine (DETROL LA) 4 MG 24 hr capsule TAKE 1 CAPSULE BY MOUTH ONCE EVERY MORNING (Patient taking differently: Take 4 mg by mouth daily. )   vitamin E 180 MG (400 UNITS) capsule Take 400 Units by mouth daily at 2 PM. 1430    Allergies No Known Allergies  Family History Family History  Problem Relation Age of Onset   Colon cancer Maternal Grandfather        stomach/colon   Heart disease Father        myocardial infarction   Hyperlipidemia Father    Hyperlipidemia Sister    Diabetes Sister    Bone cancer Other        cousin   Alzheimer's disease Mother        maternal aunts   Skin cancer Mother    Myasthenia gravis Brother        ocular   Skin cancer Brother    Stroke Paternal Grandfather    Breast cancer Neg Hx    Social History  reports that she has never smoked. She has never used smokeless tobacco. She reports that she does not drink alcohol and does not use drugs.  Review Of Systems: Unable to obtain as patient is on continuous BiPAP  VITAL SIGNS: BP 126/76    Pulse 82    Temp 98.5 F (36.9 C) (Oral)    Resp 15    Ht 5\' 2"  (1.575 m)    Wt 92.5 kg    SpO2 98%    BMI 37.31 kg/m   HEMODYNAMICS:    VENTILATOR SETTINGS:    INTAKE / OUTPUT: I/O last 3 completed shifts: In: -  Out: 1100 [Urine:1100]  PHYSICAL EXAMINATION: General: Acutely ill looking, in moderate respiratory distress HEENT: PERRLA, trachea  midline, mild JVD Neuro: Alert and oriented x3, no focal deficits, moves all extremities, follows basic commands Cardiovascular: Apical pulse tachycardic, S1-S2, no murmur regurg or gallop, +2 pulses bilaterally, no edema Lungs: Increased work of breathing, bilateral breath sounds, diminished in the bases Abdomen: Nondistended, normal bowel sounds in all 4 quadrants, palpation reveals no organomegaly Musculoskeletal: Positive range of motion, no joint deformities Skin: Warm and dry  LABS:  BMET Recent Labs  Lab 10/21/19 2112 10/23/19 0222  NA 134* 130*  K 3.4* 3.9  CL 98 98  CO2 24 26  BUN 14 15  CREATININE 0.71 0.72  GLUCOSE 185* 126*    Electrolytes Recent Labs  Lab 10/21/19 2112 10/22/19 0757 10/23/19 0222  CALCIUM 8.8*  --  8.3*  MG  --  2.1  --   PHOS  --  4.0  --     CBC Recent Labs  Lab 10/21/19 2112 10/23/19 0222  WBC 13.8* 8.7  HGB 14.6 12.0  HCT 44.3 36.8  PLT 150 129*    Coag's Recent Labs  Lab 10/21/19 2112 10/22/19 0758 10/23/19 0222  APTT  --  32  --   INR 0.9  --  1.0    Sepsis Markers Recent Labs  Lab 10/21/19 2112 10/22/19 0145 10/22/19 0757 10/23/19 0222  LATICACIDVEN 1.8 0.8  --   --   PROCALCITON  --   --  <0.10 0.13    ABG Recent Labs  Lab 10/22/19 1416  PHART 7.32*  PCO2ART 49*  PO2ART 113*    Liver Enzymes Recent Labs  Lab 10/21/19 2112  AST 24  ALT 19  ALKPHOS 59  BILITOT 0.9  ALBUMIN 3.9    Cardiac Enzymes No results for input(s): TROPONINI, PROBNP in the last 168 hours.  Glucose No results for input(s): GLUCAP in the last 168 hours.  Imaging CT Angio Chest PE W/Cm &/Or Wo Cm  Result Date: 10/22/2019 CLINICAL DATA:  Right-sided chest pain.  Shortness of breath. EXAM: CT ANGIOGRAPHY CHEST WITH CONTRAST TECHNIQUE: Multidetector CT imaging of the chest was performed using the standard protocol during bolus administration of intravenous contrast. Multiplanar CT image reconstructions and MIPs were  obtained to evaluate the vascular anatomy. CONTRAST:  185mL OMNIPAQUE IOHEXOL 350 MG/ML SOLN COMPARISON:  Chest CT 03/07/2019 FINDINGS: Cardiovascular: Cardiomegaly without pericardial effusion. Limited atheromatous changes for age. Moderate to intermittently significant CTA degradation due to motion, bolus dispersion, and streak. No definite pulmonary emboli. Mediastinum/Nodes: Interval contraction of the anterior mediastinal mass with coarse calcifications. No progressive finding or adenopathy. Lungs/Pleura: Volume loss and bandlike opacity in the paramedian right upper lobe, suspect radiation fibrosis. Atelectasis at the left base. No edema, effusion, or pneumothorax Upper Abdomen: Large spleen, also seen on prior with 13 cm anterior to posterior span and 6 cm thickness. Musculoskeletal: No acute or aggressive finding Review of the MIP images confirms the above findings. IMPRESSION: 1. Multifactorial degradation of the CTA with no definite pulmonary embolism. 2. Moderate atelectasis at the left base. A similar opacity in the paramediastinal right upper lobe is likely radiation related. 3. Regressed thymic mass. No mediastinal adenopathy or pleural nodules. Electronically Signed   By: Monte Fantasia M.D.   On: 10/22/2019 08:24   CULTURES: Blood cultures x2  ANTIBIOTICS:  Ceftriaxone Doxycycline  LINES/TUBES: Peripheral IV  DISCUSSION: 69 year old female with myasthenia gravis presenting with acute hypoxic and hypercarbic respiratory failure secondary to pneumonia  ASSESSMENT  Acute hypoxic and hypercarbic respiratory failure Community-acquired pneumonia Myasthenia gravis OSA on CPAP at at bedtime Seizure disorder Hypertension Renal cell carcinoma New onset atrial fibrillation tachycardia  PLAN No indication of acute  exacerbation of myasthenia gravis Antibiotics for community-acquired pneumonia as above Continues BiPAP and titrate to nasal cannula as tolerated Mandatory nocturnal  CPAP Nebulized bronchodilators Rest of the treatment plan per primary team  Best Practice: Code Status: Full code Diet: N.p.o. while on BiPAP GI prophylaxis: Pepcid VTE prophylaxis: SCDs and Lovenox  FAMILY  - Updates: Sister updated at bedside.   Plan of care discussed with Dr. Merrilee Jansky and [facility] physician.   Junetta Hearn S. [facility] ANP-BC Pulmonary and Critical Care Medicine [facility] Pager [phone] or [phone]  NB: This document  was prepared using Systems analyst and may include unintentional dictation errors.    10/23/2019, 7:33 AM

## 2019-10-23 NOTE — Progress Notes (Signed)
NIF -25

## 2019-10-23 NOTE — Progress Notes (Addendum)
[facility] Cardiology  SUBJECTIVE: Patient laying in bed, denies chest pain or palpitations   Vitals:   10/23/19 0500 10/23/19 0530 10/23/19 0630 10/23/19 0700  BP: 131/71 128/69 125/76 126/76  Pulse: 74 78 86 82  Resp: 13 14 17 15   Temp:      TempSrc:      SpO2: 98% 98% 98% 98%  Weight:      Height:         Intake/Output Summary (Last 24 hours) at 10/23/2019 1046 Last data filed at 10/23/2019 0636 [place] per 24 hour  Intake --  Output 1100 ml  Net -1100 ml      PHYSICAL EXAM  General: Well developed, well nourished, in no acute distress HEENT:  Normocephalic and atramatic Neck:  No JVD.  Lungs: Clear bilaterally to auscultation and percussion. Heart: HRRR . Normal S1 and S2 without gallops or murmurs.  Abdomen: Bowel sounds are positive, abdomen soft and non-tender  Msk:  Back normal, normal gait. Normal strength and tone for age. Extremities: No clubbing, cyanosis or edema.   Neuro: Alert and oriented X 3. Psych:  Good affect, responds appropriately   LABS: Basic Metabolic Panel: Recent Labs    10/21/19 2112 10/22/19 0757 10/23/19 0222  NA 134*  --  130*  K 3.4*  --  3.9  CL 98  --  98  CO2 24  --  26  GLUCOSE 185*  --  126*  BUN 14  --  15  CREATININE 0.71  --  0.72  CALCIUM 8.8*  --  8.3*  MG  --  2.1  --   PHOS  --  4.0  --    Liver Function Tests: Recent Labs    10/21/19 2112  AST 24  ALT 19  ALKPHOS 59  BILITOT 0.9  PROT 7.3  ALBUMIN 3.9   No results for input(s): LIPASE, AMYLASE in the last 72 hours. CBC: Recent Labs    10/21/19 2112 10/23/19 0222  WBC 13.8* 8.7  NEUTROABS 11.9*  --   HGB 14.6 12.0  HCT 44.3 36.8  MCV 91.9 93.6  PLT 150 129*   Cardiac Enzymes: No results for input(s): CKTOTAL, CKMB, CKMBINDEX, TROPONINI in the last 72 hours. BNP: Invalid input(s): POCBNP D-Dimer: No results for input(s): DDIMER in the last 72 hours. Hemoglobin A1C: No results for input(s): HGBA1C in the last 72 hours. Fasting Lipid Panel: No  results for input(s): CHOL, HDL, LDLCALC, TRIG, CHOLHDL, LDLDIRECT in the last 72 hours. Thyroid Function Tests: Recent Labs    10/22/19 0757  TSH 3.859   Anemia Panel: No results for input(s): VITAMINB12, FOLATE, FERRITIN, TIBC, IRON, RETICCTPCT in the last 72 hours.  DG Chest 2 View  Result Date: 10/21/2019 CLINICAL DATA:  Sepsis EXAM: CHEST - 2 VIEW COMPARISON:  January 01, 2019 FINDINGS: The heart size and mediastinal contours are within normal limits. Overall shallow degree of aeration with patchy airspace opacity at the left lung base. The right lung is clear. No pleural effusion no acute osseous abnormality. IMPRESSION: Shallow degree of aeration with patchy airspace opacity at the left lung base which could be due to atelectasis and/or infectious etiology. Electronically Signed   By: Prudencio Pair M.D.   On: 10/21/2019 21:33   CT Angio Chest PE W/Cm &/Or Wo Cm  Result Date: 10/22/2019 CLINICAL DATA:  Right-sided chest pain.  Shortness of breath. EXAM: CT ANGIOGRAPHY CHEST WITH CONTRAST TECHNIQUE: Multidetector CT imaging of the chest was performed using the standard protocol  during bolus administration of intravenous contrast. Multiplanar CT image reconstructions and MIPs were obtained to evaluate the vascular anatomy. CONTRAST:  121mL OMNIPAQUE IOHEXOL 350 MG/ML SOLN COMPARISON:  Chest CT 03/07/2019 FINDINGS: Cardiovascular: Cardiomegaly without pericardial effusion. Limited atheromatous changes for age. Moderate to intermittently significant CTA degradation due to motion, bolus dispersion, and streak. No definite pulmonary emboli. Mediastinum/Nodes: Interval contraction of the anterior mediastinal mass with coarse calcifications. No progressive finding or adenopathy. Lungs/Pleura: Volume loss and bandlike opacity in the paramedian right upper lobe, suspect radiation fibrosis. Atelectasis at the left base. No edema, effusion, or pneumothorax Upper Abdomen: Large spleen, also seen on prior with  13 cm anterior to posterior span and 6 cm thickness. Musculoskeletal: No acute or aggressive finding Review of the MIP images confirms the above findings. IMPRESSION: 1. Multifactorial degradation of the CTA with no definite pulmonary embolism. 2. Moderate atelectasis at the left base. A similar opacity in the paramediastinal right upper lobe is likely radiation related. 3. Regressed thymic mass. No mediastinal adenopathy or pleural nodules. Electronically Signed   By: Monte Fantasia M.D.   On: 10/22/2019 08:24     Echo pending  TELEMETRY: Sinus rhythm at 105 bpm:  ASSESSMENT AND PLAN:  Principal Problem:   Sepsis ([place]) Active Problems:   Hypertension   Obesity (BMI 30-39.9)   MG with exacerbation (myasthenia gravis) (HCC)   OSA (obstructive sleep apnea)   Left renal mass   Hypothyroidism   Atrial fibrillation with RVR ([place])    1. Paroxysmal atrial fibrillation with rapid ventricular rate, in the setting of left lower lobe pneumonia and sepsis, currently in sinus rhythm on diltiazem p.o. 2.  Lower lobe pneumonia/sepsis  Recommendations  1.  Agree with current therapy 2.  Defer chronic anticoagulation at this time 3.  Transition diltiazem to Cardizem CD 180 mg daily 4.  Review 2D echocardiogram   Isaias Cowman, MD, PhD, [facility] 10/23/2019 10:46 AM

## 2019-10-23 NOTE — Progress Notes (Signed)
[facility] PROGRESS NOTE    Madison Burns  YIR:[id number] DOB: Aug 25, 1950 DOA: 10/22/2019 PCP: Einar Pheasant, MD      Brief Narrative:  Madison Burns is a 69 y.o. F with developmental delay, MG on pyridostigmine, renal cancer s/p cryoablation, OSA on CPAP, invasive thymoma s/p radiation currently on carboplatin, hx seizures not currently on AED, and hypothyroidism who presented with chest pain and cough.  In the ER, found to have leukocytosis, left base opacity on chest imaging.  Neurology consulted given concern for MG flare.  Started on antibiotics, IVIG.            Assessment & Plan:  Sepsis due to community-acquired pneumonia, left lower lobe Patient presented with tachypnea and tachycardia as well as leukocytosis. -Continue Rocephin and start doxycycline -Incentive spirometry and flutter valve   Myasthenia gravis with possible flare -Consult Neurology and Critical Care -Continue pyridostigmine -Continue IVIG -Start steroids in 1 day -Serial NIF  New onset atrial fibrillation with RVR Paroxysmal by definition. CHA2DS2-VASc 2. Cardiology have been consulted and recommend no anticoagluation at this time.    Rate controlled on dilt drip overnight.  TSH normal. -Transition to oral diltiazem -Follow echo  Hypothyroidism -Continue levothyroxine  Invasive thymoma  Renal cancer  Sleep apnea -Continue BiPAP at night  Obesity BMI 37  Depression and cognitive delay -Continue duloxetine, Risperdal -Continue fesoterodine for bladder  GERD/PUD -Continue PPI, sucralfate  Hyponatremia Mild, asymptomatic   Abdominal pain -Trend LFTs -Obtain abdomen ultrasound   Disposition: Status is: Inpatient  Remains inpatient appropriate because:Inpatient level of care appropriate due to severity of illness   Dispo: The patient is from: Home              Anticipated d/c is to: Home              Anticipated d/c date is: 3 days               Patient currently is not medically stable to d/c.     The patient presented with symptoms of sepsis, and possible myasthenia flare.  She is starting to improve with therapy, and has been weaned off of the BiPAP.  Discussed the case with critical care and neurology, and we feel comfortable that her breathing is stable.  Will transfer to monitored bed, perform serial NIF to monitor closely her breathing, continue IVIG and antibiotics for myasthenia flare (presumed) and pneumonia.  PT eval, she will need to demonstrate that she is got stable breathing, and improving clinically from her illness.        MDM: The below labs and imaging reports were reviewed and summarized above.  Medication management as above.  This is a severe illness with threat to life or bodily function.  Anticoagulation decisions managed     DVT prophylaxis: enoxaparin (LOVENOX) injection 40 mg Start: 10/22/19 1000  Code Status: Full code Family Communication: Sister at the bedside    Consultants:   Critical care  Cardiology  Neurology  Procedures:     Antimicrobials:   Ceftriaxone and doxycycline 9/4>>  Culture data:   Blood culture 9/3-no growth to date  Urine culture 9/4-insignificant growth          Subjective: Patient feels "bad", but her breathing is stabilizing.  No fever overnight.  No vomiting, no confusion.  Objective: Vitals:   10/23/19 1100 10/23/19 1200 10/23/19 1300 10/23/19 1430  BP: 127/81 140/80 133/82 126/69  Pulse: 92 87 96 97  Resp: 18 17 (!)  24 (!) 22  Temp:      TempSrc:      SpO2: 96% 100% 97% 99%  Weight:      Height:        Intake/Output Summary (Last 24 hours) at 10/23/2019 1708 Last data filed at 10/23/2019 1517 [place] per 24 hour  Intake 10 ml  Output 1975 ml  Net -1965 ml   Filed Weights   10/21/19 2036  Weight: 92.5 kg    Examination: General appearance: Obese elderly adult female, awake, makes eye contact, in mild respiratory distress.    HEENT: Anicteric, conjunctiva pink, lids and lashes normal. No nasal deformity, discharge, epistaxis.  Lips dry, dentition normal, oropharynx tacky dry, no oral lesions, hearing normal.   Skin: Warm and dry.  No jaundice.  No suspicious rashes or lesions. Cardiac: Tachycardic, irregular, nl S1-S2, no murmurs appreciated.  Capillary refill is brisk.  JVnot visible.  No LE edema.  Radial pulses 2+ and symmetric. Respiratory: Paradoxical breathing, tachypnea.  Lung sounds diminished bilaterally, no rales or wheezing appreciated.   Abdomen: Abdomen soft.  Nonfocal TTP throughout. No ascites, distension, hepatosplenomegaly.   MSK: No deformities or effusions. Neuro: Awake and alert.  EOMI, moves all extremities with severe generalized weakness. Speech fluent.    Psych: Sensorium intact and responding to questions, attention normal. Affect flat.  Judgment and insight appear back in general delay.    Data Reviewed: I have personally reviewed following labs and imaging studies:  CBC: Recent Labs  Lab 10/21/19 2112 10/23/19 0222  WBC 13.8* 8.7  NEUTROABS 11.9*  --   HGB 14.6 12.0  HCT 44.3 36.8  MCV 91.9 93.6  PLT 150 509*   Basic Metabolic Panel: Recent Labs  Lab 10/21/19 2112 10/22/19 0757 10/23/19 0222  NA 134*  --  130*  K 3.4*  --  3.9  CL 98  --  98  CO2 24  --  26  GLUCOSE 185*  --  126*  BUN 14  --  15  CREATININE 0.71  --  0.72  CALCIUM 8.8*  --  8.3*  MG  --  2.1  --   PHOS  --  4.0  --    GFR: Estimated Creatinine Clearance: 71.3 mL/min (by C-G formula based on SCr of 0.72 mg/dL). Liver Function Tests: Recent Labs  Lab 10/21/19 2112  AST 24  ALT 19  ALKPHOS 59  BILITOT 0.9  PROT 7.3  ALBUMIN 3.9   No results for input(s): LIPASE, AMYLASE in the last 168 hours. No results for input(s): AMMONIA in the last 168 hours. Coagulation Profile: Recent Labs  Lab 10/21/19 2112 10/23/19 0222  INR 0.9 1.0   Cardiac Enzymes: No results for input(s): CKTOTAL,  CKMB, CKMBINDEX, TROPONINI in the last 168 hours. BNP (last 3 results) No results for input(s): PROBNP in the last 8760 hours. HbA1C: No results for input(s): HGBA1C in the last 72 hours. CBG: No results for input(s): GLUCAP in the last 168 hours. Lipid Profile: No results for input(s): CHOL, HDL, LDLCALC, TRIG, CHOLHDL, LDLDIRECT in the last 72 hours. Thyroid Function Tests: Recent Labs    10/22/19 0757  TSH 3.859   Anemia Panel: No results for input(s): VITAMINB12, FOLATE, FERRITIN, TIBC, IRON, RETICCTPCT in the last 72 hours. Urine analysis:    Component Value Date/Time   COLORURINE AMBER (A) 10/22/2019 0834   APPEARANCEUR CLOUDY (A) 10/22/2019 0834   APPEARANCEUR Cloudy (A) 09/07/2019 1151   LABSPEC 1.040 (H) 10/22/2019 0834   LABSPEC  1.019 04/25/2011 0854   PHURINE 5.0 10/22/2019 0834   GLUCOSEU NEGATIVE 10/22/2019 [place] 02/13/2016 1109   HGBUR NEGATIVE 10/22/2019 [place] 10/22/2019 0834   BILIRUBINUR neg 02/07/2015 1520   BILIRUBINUR Negative 04/25/2011 [place] 10/22/2019 0834   PROTEINUR >=300 (A) 10/22/2019 0834   UROBILINOGEN 0.2 02/13/2016 1109   NITRITE NEGATIVE 10/22/2019 0834   LEUKOCYTESUR TRACE (A) 10/22/2019 0834   LEUKOCYTESUR 2+ 04/25/2011 0854   Sepsis Labs: @LABRCNTIP (procalcitonin:4,lacticacidven:4)  ) Recent Results (from the past 240 hour(s))  Culture, blood (Routine x 2)     Status: None (Preliminary result)   Collection Time: 10/21/19  9:12 PM   Specimen: BLOOD  Result Value Ref Range Status   Specimen Description BLOOD LEFT ANTECUBITAL  Final   Special Requests   Final    BOTTLES DRAWN AEROBIC AND ANAEROBIC Blood Culture adequate volume   Culture   Final    NO GROWTH 2 DAYS Performed at [facility], [address], [place], [place] [address]    Report Status PENDING  Incomplete  Culture, blood (Routine x 2)     Status: None (Preliminary result)   Collection Time:  10/22/19  1:45 AM   Specimen: BLOOD  Result Value Ref Range Status   Specimen Description BLOOD BLOOD LEFT HAND  Final   Special Requests   Final    BOTTLES DRAWN AEROBIC AND ANAEROBIC Blood Culture results may not be optimal due to an excessive volume of blood received in culture bottles   Culture   Final    NO GROWTH 1 DAY Performed at [facility], [address] [place], [place] [address]    Report Status PENDING  Incomplete  SARS Coronavirus 2 by RT PCR (hospital order, performed in [place] hospital lab) Nasopharyngeal Nasopharyngeal Swab     Status: None   Collection Time: 10/22/19  2:04 AM   Specimen: Nasopharyngeal Swab  Result Value Ref Range Status   SARS Coronavirus 2 NEGATIVE NEGATIVE Final    Comment: (NOTE) SARS-CoV-2 target nucleic acids are NOT DETECTED.  The SARS-CoV-2 RNA is generally detectable in upper and lower respiratory specimens during the acute phase of infection. The lowest concentration of SARS-CoV-2 viral copies this assay can detect is 250 copies / mL. A negative result does not preclude SARS-CoV-2 infection and should not be used as the sole basis for treatment or other patient management decisions.  A negative result may occur with improper specimen collection / handling, submission of specimen other than nasopharyngeal swab, presence of viral mutation(s) within the areas targeted by this assay, and inadequate number of viral copies (<250 copies / mL). A negative result must be combined with clinical observations, patient history, and epidemiological information.  Fact Sheet for Patients:   StrictlyIdeas.no  Fact Sheet for Healthcare Providers: BankingDealers.co.za  This test is not yet approved or  cleared by the [place] FDA and has been authorized for detection and/or diagnosis of SARS-CoV-2 by FDA under an Emergency Use Authorization (EUA).  This EUA will remain in effect  (meaning this test can be used) for the duration of the COVID-19 declaration under Section 564(b)(1) of the Act, 21 U.S.C. section 360bbb-3(b)(1), unless the authorization is terminated or revoked sooner.  Performed at [facility], [address], [place], [place] [address]   Urine culture     Status: Abnormal   Collection Time: 10/22/19  8:34 AM   Specimen: Urine, Random  Result  Value Ref Range Status   Specimen Description   Final    URINE, RANDOM Performed at [facility], [address] [place], [place] [address]    Special Requests   Final    NONE Performed at [facility], [address] [place],  [address]    Culture (A)  Final    10,000 COLONIES/mL MULTIPLE SPECIES PRESENT, SUGGEST RECOLLECTION   Report Status 10/23/2019 FINAL  Final         Radiology Studies: DG Chest 2 View  Result Date: 10/21/2019 CLINICAL DATA:  Sepsis EXAM: CHEST - 2 VIEW COMPARISON:  January 01, 2019 FINDINGS: The heart size and mediastinal contours are within normal limits. Overall shallow degree of aeration with patchy airspace opacity at the left lung base. The right lung is clear. No pleural effusion no acute osseous abnormality. IMPRESSION: Shallow degree of aeration with patchy airspace opacity at the left lung base which could be due to atelectasis and/or infectious etiology. Electronically Signed   By: Prudencio Pair M.D.   On: 10/21/2019 21:33   CT Angio Chest PE W/Cm &/Or Wo Cm  Result Date: 10/22/2019 CLINICAL DATA:  Right-sided chest pain.  Shortness of breath. EXAM: CT ANGIOGRAPHY CHEST WITH CONTRAST TECHNIQUE: Multidetector CT imaging of the chest was performed using the standard protocol during bolus administration of intravenous contrast. Multiplanar CT image reconstructions and MIPs were obtained to evaluate the vascular anatomy. CONTRAST:  132mL OMNIPAQUE IOHEXOL 350 MG/ML SOLN COMPARISON:  Chest CT 03/07/2019 FINDINGS: Cardiovascular:  Cardiomegaly without pericardial effusion. Limited atheromatous changes for age. Moderate to intermittently significant CTA degradation due to motion, bolus dispersion, and streak. No definite pulmonary emboli. Mediastinum/Nodes: Interval contraction of the anterior mediastinal mass with coarse calcifications. No progressive finding or adenopathy. Lungs/Pleura: Volume loss and bandlike opacity in the paramedian right upper lobe, suspect radiation fibrosis. Atelectasis at the left base. No edema, effusion, or pneumothorax Upper Abdomen: Large spleen, also seen on prior with 13 cm anterior to posterior span and 6 cm thickness. Musculoskeletal: No acute or aggressive finding Review of the MIP images confirms the above findings. IMPRESSION: 1. Multifactorial degradation of the CTA with no definite pulmonary embolism. 2. Moderate atelectasis at the left base. A similar opacity in the paramediastinal right upper lobe is likely radiation related. 3. Regressed thymic mass. No mediastinal adenopathy or pleural nodules. Electronically Signed   By: Monte Fantasia M.D.   On: 10/22/2019 08:24   US Abdomen Complete  Result Date: 10/23/2019 CLINICAL DATA:  Abdominal pain. The patient had ablation of a lower pole left kidney mass earlier this year. EXAM: ABDOMEN ULTRASOUND COMPLETE COMPARISON:  Abdomen CT dated 06/15/2019. Abdomen ultrasound dated 05/01/2017. FINDINGS: Gallbladder: Surgically absent. Common bile duct: Diameter: 5.0 mm proximally Liver: No focal lesion identified. Within normal limits in parenchymal echogenicity. Portal vein is patent on color Doppler imaging with normal direction of blood flow towards the liver. IVC: No abnormality visualized. Pancreas: Visualized portion unremarkable. Spleen: Size and appearance within normal limits. Right Kidney: Length: 8.0 cm. Diffuse renal cortical thickening with prominent renal sinus fat. Normal echotexture. No hydronephrosis. Left Kidney: Length: 10.9 cm. Normal  echotexture. Stable mildly prominent, oval, extrarenal pelvis adjacent to the lower pole. The previously demonstrated lower pole solid mass is not well delineated. Abdominal aorta: No aneurysm visualized. Other findings: None. IMPRESSION: 1. No acute abnormality. 2. The previously demonstrated lower pole left renal mass is not well delineated with ultrasound today. This could be better visualized with pre and postcontrast  MRI of the kidneys on an elective outpatient basis if clinically indicated. Electronically Signed   By: Claudie Revering M.D.   On: 10/23/2019 10:56        Scheduled Meds: . vitamin C  1,000 mg Oral QHS  . B-complex with vitamin C  1 tablet Oral QPM  . calcium citrate-vitamin D  1 tablet Oral Daily  . diltiazem  180 mg Oral Daily  . doxycycline  100 mg Oral Q12H  . DULoxetine  30 mg Oral Daily  . enoxaparin (LOVENOX) injection  40 mg Subcutaneous Q24H  . fesoterodine  4 mg Oral Daily  . levothyroxine  75 mcg Oral Q0600  . pantoprazole  40 mg Oral Daily  . pyridostigmine  60 mg Oral BID  . risperiDONE  2 mg Oral QHS  . sodium chloride flush  10-40 mL Intracatheter Q12H  . sucralfate  1 g Oral BID  . vitamin E  400 Units Oral Q24H   Continuous Infusions: . cefTRIAXone (ROCEPHIN)  IV Stopped (10/22/19 2131)  . famotidine (PEPCID) IV    . Immune Globulin 10% Stopped (10/22/19 2031)     LOS: 1 day    Time spent: 35 minutes    Edwin Dada, MD [facility] 10/23/2019, 5:08 PM     Please page though [place] or Epic secure chat:  For [employer], Adult nurse

## 2019-10-23 NOTE — ED Notes (Signed)
Pt placed in hospital bed w/ new chucks and brief placed. ED tech Shanon Brow assisted.

## 2019-10-24 ENCOUNTER — Inpatient Hospital Stay
Admit: 2019-10-24 | Discharge: 2019-10-24 | Disposition: A | Discharge status: Home / Self Care | Payer: PPO | Attending: Internal Medicine | Admitting: Internal Medicine

## 2019-10-24 ENCOUNTER — Encounter: Payer: Self-pay | Admitting: Family Medicine

## 2019-10-24 ENCOUNTER — Inpatient Hospital Stay: Payer: PPO

## 2019-10-24 ENCOUNTER — Inpatient Hospital Stay: Admit: 2019-10-24 | Payer: PPO

## 2019-10-24 ENCOUNTER — Other Ambulatory Visit: Payer: Self-pay

## 2019-10-24 DIAGNOSIS — R079 Chest pain, unspecified: Secondary | ICD-10-CM

## 2019-10-24 DIAGNOSIS — R9431 Abnormal electrocardiogram [ECG] [EKG]: Secondary | ICD-10-CM

## 2019-10-24 DIAGNOSIS — L8990 Pressure ulcer of unspecified site, unspecified stage: Secondary | ICD-10-CM | POA: Insufficient documentation

## 2019-10-24 LAB — BASIC METABOLIC PANEL
Anion gap: 7 (ref 5–15)
BUN: 15 mg/dL (ref 8–23)
CO2: 27 mmol/L (ref 22–32)
Calcium: 8.7 mg/dL — ABNORMAL LOW (ref 8.9–10.3)
Chloride: 102 mmol/L (ref 98–111)
Creatinine, Ser: 0.81 mg/dL (ref 0.44–1.00)
GFR calc Af Amer: >60 mL/min (ref >60)
GFR calc non Af Amer: >60 mL/min (ref >60)
Glucose, Bld: 126 mg/dL — ABNORMAL HIGH (ref 70–99)
Potassium: 3.8 mmol/L (ref 3.5–5.1)
Sodium: 136 mmol/L (ref 135–145)

## 2019-10-24 LAB — BLOOD GAS, ARTERIAL
Acid-Base Excess: 3.9 mmol/L — ABNORMAL HIGH (ref 0.0–2.0)
Bicarbonate: 28.5 mmol/L — ABNORMAL HIGH (ref 20.0–28.0)
FIO2: 0.28
O2 Saturation: 94.9 %
Patient temperature: 37
pCO2 arterial: 42 mmHg (ref 32.0–48.0)
pH, Arterial: 7.44 (ref 7.350–7.450)
pO2, Arterial: 72 mmHg — ABNORMAL LOW (ref 83.0–108.0)

## 2019-10-24 LAB — CBC
HCT: 37.2 % (ref 36.0–46.0)
Hemoglobin: 12 g/dL (ref 12.0–15.0)
MCH: 30.2 pg (ref 26.0–34.0)
MCHC: 32.3 g/dL (ref 30.0–36.0)
MCV: 93.5 fL (ref 80.0–100.0)
Platelets: 154 10*3/uL (ref 150–400)
RBC: 3.98 MIL/uL (ref 3.87–5.11)
RDW: 13.8 % (ref 11.5–15.5)
WBC: 7.3 10*3/uL (ref 4.0–10.5)
nRBC: 0 % (ref 0.0–0.2)

## 2019-10-24 LAB — HEPATIC FUNCTION PANEL
ALT: 17 U/L (ref 0–44)
AST: 30 U/L (ref 15–41)
Albumin: 2.8 g/dL — ABNORMAL LOW (ref 3.5–5.0)
Alkaline Phosphatase: 47 U/L (ref 38–126)
Bilirubin, Direct: <0.1 mg/dL (ref 0.0–0.2)
Total Bilirubin: 0.6 mg/dL (ref 0.3–1.2)
Total Protein: 7 g/dL (ref 6.5–8.1)

## 2019-10-24 LAB — TROPONIN I (HIGH SENSITIVITY)
Troponin I (High Sensitivity): 69 ng/L — ABNORMAL HIGH (ref <18)
Troponin I (High Sensitivity): 54 ng/L — ABNORMAL HIGH (ref <18)
Troponin I (High Sensitivity): 60 ng/L — ABNORMAL HIGH (ref <18)
Troponin I (High Sensitivity): 62 ng/L — ABNORMAL HIGH (ref <18)

## 2019-10-24 LAB — MRSA PCR SCREENING: MRSA by PCR: NEGATIVE

## 2019-10-24 LAB — GLUCOSE, CAPILLARY: Glucose-Capillary: 153 mg/dL — ABNORMAL HIGH (ref 70–99)

## 2019-10-24 MED ORDER — VANCOMYCIN HCL 2000 MG/400ML IV SOLN
2000.0000 mg | Freq: Once | INTRAVENOUS | Status: DC
  Administered 2019-10-24: 2000 mg via INTRAVENOUS
  Filled 2019-10-24: qty 400

## 2019-10-24 MED ORDER — NITROGLYCERIN 0.4 MG SL SUBL
0.4000 mg | SUBLINGUAL_TABLET | SUBLINGUAL | Status: AC | PRN
  Administered 2019-10-24 (×3): 0.4 mg via SUBLINGUAL

## 2019-10-24 MED ORDER — SODIUM CHLORIDE 0.9 % IV SOLN: INTRAVENOUS | Status: DC

## 2019-10-24 MED ORDER — VANCOMYCIN HCL IN DEXTROSE 1-5 GM/200ML-% IV SOLN
1000.0000 mg | Freq: Two times a day (BID) | INTRAVENOUS | Status: DC
  Filled 2019-10-24: qty 200

## 2019-10-24 MED ORDER — EPINEPHRINE 0.3 MG/0.3ML IJ SOAJ
0.3000 mg | Freq: Once | INTRAMUSCULAR | Status: DC | PRN
  Filled 2019-10-24: qty 0.3

## 2019-10-24 MED ORDER — PYRIDOSTIGMINE BROMIDE 60 MG PO TABS
60.0000 mg | ORAL_TABLET | Freq: Three times a day (TID) | ORAL | Status: DC
  Administered 2019-10-24 – 2019-10-28 (×12): 60 mg via ORAL
  Filled 2019-10-24 (×14): qty 1

## 2019-10-24 MED ORDER — SODIUM CHLORIDE 0.9 % IV SOLN
2.0000 g | Freq: Three times a day (TID) | INTRAVENOUS | Status: DC
  Administered 2019-10-24 – 2019-10-27 (×10): 2 g via INTRAVENOUS
  Filled 2019-10-24 (×13): qty 2

## 2019-10-24 MED ORDER — SODIUM CHLORIDE 0.9 % IV BOLUS
500.0000 mL | Freq: Once | INTRAVENOUS | Status: AC
  Administered 2019-10-24: 500 mL via INTRAVENOUS

## 2019-10-24 NOTE — Progress Notes (Signed)
*  PRELIMINARY RESULTS* Echocardiogram 2D Echocardiogram has been performed.  Madison Burns 10/24/2019, 1:16 PM

## 2019-10-24 NOTE — Progress Notes (Addendum)
Subjective: Respiratory function has worsened since yesterday.   Objective: Current vital signs: BP 139/90 (BP Location: Right Arm)   Pulse (!) 130   Temp 99.2 F (37.3 C) (Oral)   Resp (!) 24   Ht 5\' 2"  (1.575 m)   Wt 92.5 kg   SpO2 95%   BMI 37.31 kg/m  Vital signs in last 24 hours: Temp:  [99.1 F (37.3 C)-100.1 F (37.8 C)] 99.2 F (37.3 C) (09/06 1526) Pulse Rate:  [99-130] 130 (09/06 1526) Resp:  [16-30] 24 (09/06 1208) BP: (111-174)/(60-96) 139/90 (09/06 1526) SpO2:  [92 %-100 %] 95 % (09/06 1526)  Intake/Output from previous day: 09/05 0701 - 09/06 0700 In: 360 [P.O.:240; I.V.:20; IV Piggyback:100] Out: 4270 [Urine:1825] Intake/Output this shift: Total I/O In: 120 [P.O.:120] Out: -  Nutritional status:  Diet Order            Diet NPO time specified Except for: Ice Chips, Sips with Meds  Diet effective now                 General: Lying in bed with BIPAP mask on Lungs: Breathing comfortably with assitance of BiPAP Extremities: No edema   Neuro: Ment: Alert, follows all commands. Speech fluent. Naming intact.  CN: Pupils equal. EOMI. No nystagmus. Face symmetric Motor: 5/5 strength in all 4 extremities Sensory: Temp sensation intact to proximal limbs Reflexes: 1+ bilateral biceps, brachioradialis and patellae.  Coordination: No ataxia noted.     Lab Results: Results for orders placed or performed during the hospital encounter of 10/22/19 (from the past 48 hour(s))  Troponin I (High Sensitivity)     Status: Abnormal   Collection Time: 10/22/19  5:35 PM  Result Value Ref Range   Troponin I (High Sensitivity) 24 (H) <18 ng/L    Comment: (NOTE) Elevated high sensitivity troponin I (hsTnI) values and significant  changes across serial measurements may suggest ACS but many other  chronic and acute conditions are known to elevate hsTnI results.  Refer to the "Links" section for chest pain algorithms and additional  guidance. Performed at [facility], [address] [place], [place] [address]   HIV Antibody (routine testing w rflx)     Status: None   Collection Time: 10/22/19  8:32 PM  Result Value Ref Range   HIV Screen 4th Generation wRfx Non Reactive Non Reactive    Comment: Performed at [facility], [address] [place], [place] [address]  Protime-INR     Status: None   Collection Time: 10/23/19  2:22 AM  Result Value Ref Range   Prothrombin Time 13.1 11.4 - 15.2 seconds   INR 1.0 0.8 - 1.2    Comment: (NOTE) INR goal varies based on device and disease states. Performed at [facility], [address] [place], [place] [address]   Procalcitonin     Status: None   Collection Time: 10/23/19  2:22 AM  Result Value Ref Range   Procalcitonin 0.13 ng/mL    Comment:        Interpretation: PCT (Procalcitonin) <= 0.5 ng/mL: Systemic infection (sepsis) is not likely. Local bacterial infection is possible. (NOTE)       Sepsis PCT Algorithm           Lower Respiratory Tract  Infection PCT Algorithm    ----------------------------     ----------------------------         PCT < 0.25 ng/mL                PCT < 0.10 ng/mL          Strongly encourage             Strongly discourage   discontinuation of antibiotics    initiation of antibiotics    ----------------------------     -----------------------------       PCT 0.25 - 0.50 ng/mL            PCT 0.10 - 0.25 ng/mL               OR       >80% decrease in PCT            Discourage initiation of                                            antibiotics      Encourage discontinuation           of antibiotics    ----------------------------     -----------------------------         PCT >= 0.50 ng/mL              PCT 0.26 - 0.50 ng/mL               AND        <80% decrease in PCT             Encourage initiation of                                             antibiotics       Encourage continuation           of  antibiotics    ----------------------------     -----------------------------        PCT >= 0.50 ng/mL                  PCT > 0.50 ng/mL               AND         increase in PCT                  Strongly encourage                                      initiation of antibiotics    Strongly encourage escalation           of antibiotics                                     -----------------------------                                           PCT <= 0.25 ng/mL  OR                                        > 80% decrease in PCT                                      Discontinue / Do not initiate                                             antibiotics  Performed at [facility], [address] [place], [place] [address]   Basic metabolic panel     Status: Abnormal   Collection Time: 10/23/19  2:22 AM  Result Value Ref Range   Sodium 130 (L) 135 - 145 mmol/L   Potassium 3.9 3.5 - 5.1 mmol/L   Chloride 98 98 - 111 mmol/L   CO2 26 22 - 32 mmol/L   Glucose, Bld 126 (H) 70 - 99 mg/dL    Comment: Glucose reference range applies only to samples taken after fasting for at least 8 hours.   BUN 15 8 - 23 mg/dL   Creatinine, Ser 0.72 0.44 - 1.00 mg/dL   Calcium 8.3 (L) 8.9 - 10.3 mg/dL   GFR calc non Af Amer >60 >60 mL/min   GFR calc Af Amer >60 >60 mL/min   Anion gap 6 5 - 15    Comment: Performed at [facility], [address] [place], [place] [address]  CBC     Status: Abnormal   Collection Time: 10/23/19  2:22 AM  Result Value Ref Range   WBC 8.7 4.0 - 10.5 K/uL   RBC 3.93 3.87 - 5.11 MIL/uL   Hemoglobin 12.0 12.0 - 15.0 g/dL   HCT 36.8 36 - 46 %   MCV 93.6 80.0 - 100.0 fL   MCH 30.5 26.0 - 34.0 pg   MCHC 32.6 30.0 - 36.0 g/dL   RDW 13.7 11.5 - 15.5 %   Platelets 129 (L) 150 - 400 K/uL   nRBC 0.0 0.0 - 0.2 %    Comment: Performed at [facility], [address] [place], [place] [address]  CBC     Status:  None   Collection Time: 10/24/19  5:43 AM  Result Value Ref Range   WBC 7.3 4.0 - 10.5 K/uL   RBC 3.98 3.87 - 5.11 MIL/uL   Hemoglobin 12.0 12.0 - 15.0 g/dL   HCT 37.2 36 - 46 %   MCV 93.5 80.0 - 100.0 fL   MCH 30.2 26.0 - 34.0 pg   MCHC 32.3 30.0 - 36.0 g/dL   RDW 13.8 11.5 - 15.5 %   Platelets 154 150 - 400 K/uL   nRBC 0.0 0.0 - 0.2 %    Comment: Performed at [facility], [address], [place],  [address]  Basic metabolic panel     Status: Abnormal   Collection Time: 10/24/19  5:43 AM  Result Value Ref Range   Sodium 136 135 - 145 mmol/L   Potassium 3.8 3.5 - 5.1 mmol/L   Chloride 102 98 - 111 mmol/L   CO2 27 22 - 32 mmol/L   Glucose, Bld 126 (H) 70 - 99  mg/dL    Comment: Glucose reference range applies only to samples taken after fasting for at least 8 hours.   BUN 15 8 - 23 mg/dL   Creatinine, Ser 0.81 0.44 - 1.00 mg/dL   Calcium 8.7 (L) 8.9 - 10.3 mg/dL   GFR calc non Af Amer >60 >60 mL/min   GFR calc Af Amer >60 >60 mL/min   Anion gap 7 5 - 15    Comment: Performed at [facility], [address] [place], [place] [address]  Hepatic function panel     Status: Abnormal   Collection Time: 10/24/19  5:43 AM  Result Value Ref Range   Total Protein 7.0 6.5 - 8.1 g/dL   Albumin 2.8 (L) 3.5 - 5.0 g/dL   AST 30 15 - 41 U/L   ALT 17 0 - 44 U/L   Alkaline Phosphatase 47 38 - 126 U/L   Total Bilirubin 0.6 0.3 - 1.2 mg/dL   Bilirubin, Direct <0.1 0.0 - 0.2 mg/dL   Indirect Bilirubin NOT CALCULATED 0.3 - 0.9 mg/dL    Comment: Performed at [facility], [address] [place], [place] [address]  Glucose, capillary     Status: Abnormal   Collection Time: 10/24/19  8:35 AM  Result Value Ref Range   Glucose-Capillary 153 (H) 70 - 99 mg/dL    Comment: Glucose reference range applies only to samples taken after fasting for at least 8 hours.  Blood gas, arterial     Status: Abnormal   Collection Time: 10/24/19  9:22 AM  Result Value Ref  Range   FIO2 0.28    Delivery systems NASAL CANNULA    pH, Arterial 7.44 7.35 - 7.45   pCO2 arterial 42 32 - 48 mmHg   pO2, Arterial 72 (L) 83 - 108 mmHg   Bicarbonate 28.5 (H) 20.0 - 28.0 mmol/L   Acid-Base Excess 3.9 (H) 0.0 - 2.0 mmol/L   O2 Saturation 94.9 %   Patient temperature 37.0    Collection site RIGHT RADIAL    Sample type ARTERIAL DRAW    Allens test (pass/fail) PASS PASS    Comment: Performed at [facility], [address] [place], [place] [address]  Troponin I (High Sensitivity)     Status: Abnormal   Collection Time: 10/24/19 10:08 AM  Result Value Ref Range   Troponin I (High Sensitivity) 69 (H) <18 ng/L    Comment: READ BACK AND VERIFIED WITH JACK DOUGHERTY AT 1226 10/24/19 DAS (NOTE) Elevated high sensitivity troponin I (hsTnI) values and significant  changes across serial measurements may suggest ACS but many other  chronic and acute conditions are known to elevate hsTnI results.  Refer to the "Links" section for chest pain algorithms and additional  guidance. Performed at [facility], [place], [place] [address]   Troponin I (High Sensitivity)     Status: Abnormal   Collection Time: 10/24/19 12:47 PM  Result Value Ref Range   Troponin I (High Sensitivity) 54 (H) <18 ng/L    Comment: (NOTE) Elevated high sensitivity troponin I (hsTnI) values and significant  changes across serial measurements may suggest ACS but many other  chronic and acute conditions are known to elevate hsTnI results.  Refer to the "Links" section for chest pain algorithms and additional  guidance. Performed at [facility], [address] [place], [place] [address]     Recent Results (from the past 240 hour(s))  Culture, blood (Routine x 2)  Status: None (Preliminary result)   Collection Time: 10/21/19  9:12 PM   Specimen: BLOOD  Result Value Ref Range Status   Specimen Description BLOOD LEFT ANTECUBITAL  Final   Special  Requests   Final    BOTTLES DRAWN AEROBIC AND ANAEROBIC Blood Culture adequate volume   Culture   Final    NO GROWTH 3 DAYS Performed at [facility], [address] [place], [place] [address]    Report Status PENDING  Incomplete  Culture, blood (Routine x 2)     Status: None (Preliminary result)   Collection Time: 10/22/19  1:45 AM   Specimen: BLOOD  Result Value Ref Range Status   Specimen Description BLOOD BLOOD LEFT HAND  Final   Special Requests   Final    BOTTLES DRAWN AEROBIC AND ANAEROBIC Blood Culture results may not be optimal due to an excessive volume of blood received in culture bottles   Culture   Final    NO GROWTH 2 DAYS Performed at [facility], [address], [place], [place] [address]    Report Status PENDING  Incomplete  SARS Coronavirus 2 by RT PCR (hospital order, performed in [place] hospital lab) Nasopharyngeal Nasopharyngeal Swab     Status: None   Collection Time: 10/22/19  2:04 AM   Specimen: Nasopharyngeal Swab  Result Value Ref Range Status   SARS Coronavirus 2 NEGATIVE NEGATIVE Final    Comment: (NOTE) SARS-CoV-2 target nucleic acids are NOT DETECTED.  The SARS-CoV-2 RNA is generally detectable in upper and lower respiratory specimens during the acute phase of infection. The lowest concentration of SARS-CoV-2 viral copies this assay can detect is 250 copies / mL. A negative result does not preclude SARS-CoV-2 infection and should not be used as the sole basis for treatment or other patient management decisions.  A negative result may occur with improper specimen collection / handling, submission of specimen other than nasopharyngeal swab, presence of viral mutation(s) within the areas targeted by this assay, and inadequate number of viral copies (<250 copies / mL). A negative result must be combined with clinical observations, patient history, and epidemiological information.  Fact Sheet for Patients:    StrictlyIdeas.no  Fact Sheet for Healthcare Providers: BankingDealers.co.za  This test is not yet approved or  cleared by the [place] FDA and has been authorized for detection and/or diagnosis of SARS-CoV-2 by FDA under an Emergency Use Authorization (EUA).  This EUA will remain in effect (meaning this test can be used) for the duration of the COVID-19 declaration under Section 564(b)(1) of the Act, 21 U.S.C. section 360bbb-3(b)(1), unless the authorization is terminated or revoked sooner.  Performed at [facility], [address], [place], [place] [address]   Urine culture     Status: Abnormal   Collection Time: 10/22/19  8:34 AM   Specimen: Urine, Random  Result Value Ref Range Status   Specimen Description   Final    URINE, RANDOM Performed at [facility], [address] [place], [place] [address]    Special Requests   Final    NONE Performed at [facility], [address] [place], [place] [address]    Culture (A)  Final    10,000 COLONIES/mL MULTIPLE SPECIES PRESENT, SUGGEST RECOLLECTION   Report Status 10/23/2019 FINAL  Final    Lipid Panel No results for input(s): CHOL, TRIG, HDL, CHOLHDL, VLDL, LDLCALC in the last 72 hours.  Studies/Results: US Abdomen Complete  Result Date: 10/23/2019 CLINICAL  DATA:  Abdominal pain. The patient had ablation of a lower pole left kidney mass earlier this year. EXAM: ABDOMEN ULTRASOUND COMPLETE COMPARISON:  Abdomen CT dated 06/15/2019. Abdomen ultrasound dated 05/01/2017. FINDINGS: Gallbladder: Surgically absent. Common bile duct: Diameter: 5.0 mm proximally Liver: No focal lesion identified. Within normal limits in parenchymal echogenicity. Portal vein is patent on color Doppler imaging with normal direction of blood flow towards the liver. IVC: No abnormality visualized. Pancreas: Visualized portion unremarkable. Spleen: Size and appearance within  normal limits. Right Kidney: Length: 8.0 cm. Diffuse renal cortical thickening with prominent renal sinus fat. Normal echotexture. No hydronephrosis. Left Kidney: Length: 10.9 cm. Normal echotexture. Stable mildly prominent, oval, extrarenal pelvis adjacent to the lower pole. The previously demonstrated lower pole solid mass is not well delineated. Abdominal aorta: No aneurysm visualized. Other findings: None. IMPRESSION: 1. No acute abnormality. 2. The previously demonstrated lower pole left renal mass is not well delineated with ultrasound today. This could be better visualized with pre and postcontrast MRI of the kidneys on an elective outpatient basis if clinically indicated. Electronically Signed   By: Claudie Revering M.D.   On: 10/23/2019 10:56   DG Chest Port 1 View  Result Date: 10/24/2019 CLINICAL DATA:  Acute respiratory distress. EXAM: PORTABLE CHEST 1 VIEW COMPARISON:  October 21, 2019. FINDINGS: Stable cardiomediastinal silhouette. Mild central pulmonary vascular congestion is noted. No pneumothorax is noted. Hypoinflation of the lungs is noted. Mild left basilar atelectasis or infiltrate is noted with associated pleural effusion. Bony thorax is unremarkable. IMPRESSION: Hypoinflation of the lungs. Mild left basilar atelectasis or infiltrate is noted with associated pleural effusion. Mild central pulmonary vascular congestion is noted. Electronically Signed   By: Marijo Conception M.D.   On: 10/24/2019 09:11    Medications:  Scheduled: . vitamin C  1,000 mg Oral QHS  . B-complex with vitamin C  1 tablet Oral QPM  . calcium citrate-vitamin D  1 tablet Oral Daily  . diltiazem  180 mg Oral Daily  . doxycycline  100 mg Oral Q12H  . enoxaparin (LOVENOX) injection  40 mg Subcutaneous Q24H  . levothyroxine  75 mcg Oral Q0600  . pantoprazole  40 mg Oral Daily  . pyridostigmine  60 mg Oral Q8H  . sodium chloride flush  10-40 mL Intracatheter Q12H  . sucralfate  1 g Oral BID  . vitamin E  400 Units  Oral Q24H   Continuous: . sodium chloride    . cefTRIAXone (ROCEPHIN)  IV 2 g (10/23/19 2234)  . Immune Globulin 10% 220 mL/hr at 10/23/19 1941  . sodium chloride       Assessment: 69 year old female with myasthenia gravis presenting with acute hypoxic and hypercarbic respiratory failure in the setting of sepsis.  -- CT chest negative for PE and pneumonia, however it appears patient does have LL pneumonia.   --Possible myasthenia gravis exacerbation may be contributing to her respiratory failure. Her NIF today was 20. She has worsened since yesterday. She is on day 3/5 of IVIG treatment.  --Her worsening respirations since yesterday (Sunday) are not likely to be due to side effect of Mestinon as her total daily dose is relatively low --Of note, her methotrexate, which was administered as four 2.5 mg tablets qSat, was stopped in April when the patient started chemotherapy for her thymoma. After the completion of her chemotherapy regimen, it was decided to continue off methotrexate. This is her first exacerbation since the initial exacerbation in 2011 which led to her diagnosis  of MG.    Recommendations -Continue IVIG at 400 mg/kg qd for a total of 5 days -If no improvement on the day after her 5th IVIG dose, consider transfer to [facility] for PLEX -Continue Mestinon 60 mg BID -Continue to treat pneumonia -Discuss with Neurologist Dr. Beacher May regarding possibly restarting prednisone and/or methotrexate -Increase frequency of NIF to q6h. Also should have FVC q6h -Close neuro checks and monitoring of airway   LOS: 2 days   @Electronically  signed: Dr. Kerney Elbe 10/24/2019  3:33 PM

## 2019-10-24 NOTE — Progress Notes (Signed)
Pharmacy Antibiotic Note  Madison Burns is a 70 y.o. female admitted on 10/22/2019 with new onset A.fib and Sepsis secondary to CAP.  Left base opacity on chest imaging. Patient was started on Ceftriaxone and doxycycline.  Pharmacy now consulted for Cefepime and vancomycin dosing.  Plan: Vancomycin 2000mg  IV x 1 dose, followed by vancomycin 1000mg  IV every 12 hours.   Start Cefepime 2g IV every 8 hours   Height: 5\' 2"  (157.5 cm) Weight: 92.5 kg (204 lb) IBW/kg (Calculated) : 50.1  Temp (24hrs), Avg:99.2 F (37.3 C), Min:98.7 F (37.1 C), Max:100.1 F (37.8 C)  Recent Labs  Lab 10/21/19 2112 10/22/19 0145 10/23/19 0222 10/24/19 0543  WBC 13.8*  --  8.7 7.3  CREATININE 0.71  --  0.72 0.81  LATICACIDVEN 1.8 0.8  --   --     Estimated Creatinine Clearance: 70.4 mL/min (by C-G formula based on SCr of 0.81 mg/dL).    No Known Allergies  Antimicrobials this admission: 9/4 ceftriaxone  >> 9/6 9/5 Doxycycline>> 9/6 9/6 Cefepime>> 9/6 Vancomycin>>   Microbiology results: 9/3  BCx: pending 9/6  MRSA PCR: pending   Thank you for allowing pharmacy to be a part of this patient's care.  Pernell Dupre, PharmD, BCPS Clinical Pharmacist 10/24/2019 5:38 PM

## 2019-10-24 NOTE — Progress Notes (Signed)
At bedside to hang IvIG.  Amy RN states to hold on IvIG, due to pt with EKG changes, calling MD.  Instructed to notify when rescheduled.

## 2019-10-24 NOTE — Progress Notes (Signed)
Patient has remained between a RED and YELLOW mews today.  MD aware.  Patient on BIPAP at this time and other VSS.

## 2019-10-24 NOTE — Progress Notes (Signed)
[facility] Cardiology  SUBJECTIVE: Patient laying in bed, reports sharp chest pain, pleuritic in nature, likely noncardiac   Vitals:   10/24/19 0835 10/24/19 0841 10/24/19 0842 10/24/19 0900  BP: (!) 144/72 135/72 135/72 111/60  Pulse: 100 99 99   Resp:   (!) 28   Temp:      TempSrc:      SpO2: 96%     Weight:      Height:         Intake/Output Summary (Last 24 hours) at 10/24/2019 1100 Last data filed at 10/24/2019 0516 Geissinger per 24 hour  Intake 360 ml  Output 1825 ml  Net -1465 ml      PHYSICAL EXAM  General: Well developed, well nourished, in no acute distress HEENT:  Normocephalic and atramatic Neck:  No JVD.  Lungs: Clear bilaterally to auscultation and percussion. Heart: HRRR . Normal S1 and S2 without gallops or murmurs.  Abdomen: Bowel sounds are positive, abdomen soft and non-tender  Msk:  Back normal, normal gait. Normal strength and tone for age. Extremities: No clubbing, cyanosis or edema.   Neuro: Alert and oriented X 3. Psych:  Good affect, responds appropriately   LABS: Basic Metabolic Panel: Recent Labs    10/21/19 2112 10/22/19 0757 10/23/19 0222 10/24/19 0543  NA   < >  --  130* 136  K   < >  --  3.9 3.8  CL   < >  --  98 102  CO2   < >  --  26 27  GLUCOSE   < >  --  126* 126*  BUN   < >  --  15 15  CREATININE   < >  --  0.72 0.81  CALCIUM   < >  --  8.3* 8.7*  MG  --  2.1  --   --   PHOS  --  4.0  --   --    < > = values in this interval not displayed.   Liver Function Tests: Recent Labs    10/21/19 2112 10/24/19 0543  AST 24 30  ALT 19 17  ALKPHOS 59 47  BILITOT 0.9 0.6  PROT 7.3 7.0  ALBUMIN 3.9 2.8*   No results for input(s): LIPASE, AMYLASE in the last 72 hours. CBC: Recent Labs    10/21/19 2112 10/21/19 2112 10/23/19 0222 10/24/19 0543  WBC 13.8*   < > 8.7 7.3  NEUTROABS 11.9*  --   --   --   HGB 14.6   < > 12.0 12.0  HCT 44.3   < > 36.8 37.2  MCV 91.9   < > 93.6 93.5  PLT 150   < > 129* 154   < > = values in this  interval not displayed.   Cardiac Enzymes: No results for input(s): CKTOTAL, CKMB, CKMBINDEX, TROPONINI in the last 72 hours. BNP: Invalid input(s): POCBNP D-Dimer: No results for input(s): DDIMER in the last 72 hours. Hemoglobin A1C: No results for input(s): HGBA1C in the last 72 hours. Fasting Lipid Panel: No results for input(s): CHOL, HDL, LDLCALC, TRIG, CHOLHDL, LDLDIRECT in the last 72 hours. Thyroid Function Tests: Recent Labs    10/22/19 0757  TSH 3.859   Anemia Panel: No results for input(s): VITAMINB12, FOLATE, FERRITIN, TIBC, IRON, RETICCTPCT in the last 72 hours.  US Abdomen Complete  Result Date: 10/23/2019 CLINICAL DATA:  Abdominal pain. The patient had ablation of a lower pole left kidney mass earlier this year. EXAM:  ABDOMEN ULTRASOUND COMPLETE COMPARISON:  Abdomen CT dated 06/15/2019. Abdomen ultrasound dated 05/01/2017. FINDINGS: Gallbladder: Surgically absent. Common bile duct: Diameter: 5.0 mm proximally Liver: No focal lesion identified. Within normal limits in parenchymal echogenicity. Portal vein is patent on color Doppler imaging with normal direction of blood flow towards the liver. IVC: No abnormality visualized. Pancreas: Visualized portion unremarkable. Spleen: Size and appearance within normal limits. Right Kidney: Length: 8.0 cm. Diffuse renal cortical thickening with prominent renal sinus fat. Normal echotexture. No hydronephrosis. Left Kidney: Length: 10.9 cm. Normal echotexture. Stable mildly prominent, oval, extrarenal pelvis adjacent to the lower pole. The previously demonstrated lower pole solid mass is not well delineated. Abdominal aorta: No aneurysm visualized. Other findings: None. IMPRESSION: 1. No acute abnormality. 2. The previously demonstrated lower pole left renal mass is not well delineated with ultrasound today. This could be better visualized with pre and postcontrast MRI of the kidneys on an elective outpatient basis if clinically indicated.  Electronically Signed   By: Claudie Revering M.D.   On: 10/23/2019 10:56   DG Chest Port 1 View  Result Date: 10/24/2019 CLINICAL DATA:  Acute respiratory distress. EXAM: PORTABLE CHEST 1 VIEW COMPARISON:  October 21, 2019. FINDINGS: Stable cardiomediastinal silhouette. Mild central pulmonary vascular congestion is noted. No pneumothorax is noted. Hypoinflation of the lungs is noted. Mild left basilar atelectasis or infiltrate is noted with associated pleural effusion. Bony thorax is unremarkable. IMPRESSION: Hypoinflation of the lungs. Mild left basilar atelectasis or infiltrate is noted with associated pleural effusion. Mild central pulmonary vascular congestion is noted. Electronically Signed   By: Marijo Conception M.D.   On: 10/24/2019 09:11     Echo pending  TELEMETRY: Sinus rhythm at 102 bpm:  ASSESSMENT AND PLAN:  Principal Problem:   Sepsis ([place]) Active Problems:   Hypertension   Obesity (BMI 30-39.9)   MG with exacerbation (myasthenia gravis) (HCC)   OSA (obstructive sleep apnea)   Left renal mass   Hypothyroidism   Atrial fibrillation with RVR ([place])   Community acquired pneumonia   Pressure injury of skin    1. Paroxysmal atrial fibrillationwith rapid ventricular rate,brief episode in the setting of left lower lobe pneumonia and sepsis, currently in sinus rhythm on  Cardizem CD 180 mg daily 2.Lower lobe pneumonia/sepsis 3.  Chest pain, atypical, sharp and pleuritic in nature, likely due to underlying pneumonia, not cardiac in nature  Recommendations  1.  Agree with current therapy 2.  Defer chronic anticoagulation at this time, since atrial fibrillation occurred during a reversible condition(  left lower lobe pneumonia/sepsis ), has remained in sinus rhythm on Cardizem 3.  Continue Cardizem CD 180 mg daily 4.  Review 2D echocardiogram   Isaias Cowman, MD, PhD, [facility] 10/24/2019 11:00 AM

## 2019-10-24 NOTE — Progress Notes (Signed)
CRITICAL VALUE ALERT  Critical Value:  CCMD called with patient having ST elevated in leads 2, 3, avF  Date & Time Notied:  10/24/2019 4:45 PM   Provider Notified: Dr. Loleta Books  Orders Received/Actions taken: STAT EKG and STAT troponin high sensitivity.   Dr. Loleta Books spoke to Dr. Saralyn Pilar who does not believe this is diagnostic.  Patient currently not having CP.  VSS.  Will continue to monitor as they trend troponins.

## 2019-10-24 NOTE — Progress Notes (Signed)
During shift change report, Pt complained of mid chest pain rated as severe. VS assessed BP 144/72, HR 100 she is Bipap on 4 L Sats 96%. 36 Respirations Abdominal breathing. Bipap Removed per pt request. Pt placed on nasal cannula 4 L. Sats remained above 96%. RT and MD Danford paged to make aware.

## 2019-10-24 NOTE — Progress Notes (Signed)
Pt.was transferred to cardiac unit per MD order. Family at bedside. Report given to [facility] Questions answered.

## 2019-10-24 NOTE — Progress Notes (Signed)
Madison Burns PROGRESS NOTE    Madison Burns  WPY:[id number] DOB: 1950/06/06 DOA: 10/22/2019 PCP: Einar Pheasant, MD      Brief Narrative:  Mrs. Madison Burns is a 69 y.o. F with developmental delay, MG on pyridostigmine, renal cancer s/p cryoablation, OSA on CPAP, invasive thymoma s/p radiation currently on carboplatin, hx seizures not currently on AED, and hypothyroidism who presented with chest pain and cough.  In the ER, found to have leukocytosis, left base opacity on chest imaging.  Neurology consulted given concern for MG flare.  Started on antibiotics, IVIG.            Assessment & Plan:  Sepsis due to community-acquired pneumonia, left lower lobe Patient presented with tachypnea and tachycardia as well as leukocytosis.  Respiratory status appears to be worsening -Stop Rocephin -Continue doxycycline -Broaden to vancomycin and Zosyn -Obtain MRSA swab de-escalate vancomycin as able -Obtain sputum culture -Start BiPAP -Consult pulmonology and neurology    Myasthenia gravis with possible flare Patient had 1 flare of myasthenia in 2011 has been stable on Mestinon since then.  However here, she appears to have reduced NIF (-20 only) and clinically to have reduced chest wall movement.  Neurology were consulted.  No plasmapheresis available here, but aggressive and appropriate treatment for MG crisis with IVIG was started  -Continue IVIG  -Continue pyridostigmine, increase dose -Continue serial NIFs and FVCs q6hrs -Consult Neurology and Critical Care  -BiPAP continuous and transfer to progressive care   New onset atrial fibrillation with RVR Paroxysmal by definition. CHA2DS2-VASc 2. Cardiology have been consulted and recommend no anticoagluation at this time.    Initially placed on diltiazem drip, and converted back to sinus rhythm.  TSH normal. -Continue oral diltiazem -Consult cardiology, appreciate cares   Chest pain Patient had an episode  of chest pain this morning.  Serial troponins normal, ECG personally reviewed, without ST changes  Hypothyroidism -Continue levothyroxine  Invasive thymoma  Renal cancer  Sleep apnea -Continue BiPAP at night  Obesity BMI 37  Depression and cognitive delay -Continue duloxetine, Risperdal -Continue fesoterodine for bladder  GERD/PUD -Continue PPI, sucralfate  Hyponatremia Mild, asymptomatic   Abdominal pain, transient, resolved Abdominal ultrasound normal, LFTs normal.  No further abdominal pain, exam benign today.           Disposition: Status is: Inpatient  Remains inpatient appropriate because:Inpatient level of care appropriate due to severity of illness   Dispo: The patient is from: Home              Anticipated d/c is to: Home              Anticipated d/c date is: 3 days              Patient currently is not medically stable to d/c.     The patient presented with symptoms of sepsis, and possible myasthenia flare.    She had some initial improvement with antibiotics alone, but over the last 24 hours, her respiratory status appears worse.  She has paradoxical breathing, hypoxia on ABG this morning after BiPAP overnight.  I do not feel she is able to sustain her respiratory effort without BiPAP.    Long discussion today with sister, brothers (POAs) about CODE STATUS.  it is my opinion that with her current respiratory failure, she would not be able to be liberated from a ventilator if she were intubated, and would likely (if she survivied) require tracheostomy and lifelong ventilator.  Family do not  feel this futile treatment would be within her wishes, and we have plans to continue BiPAP and medical therapies, up to and including plasmapheresis, but if she deteriorated, to consider withdrawing care compassionately rather than prolong suffering with intubation.    Continuous BiPAP for now.  Monitor clinical response to IVIG.  If no response to IVIG, will  transfer to [facility] for plasmapheresis.         MDM: The below labs and imaging reports were reviewed and summarized above.  Medication management as above.  This is a severe illness with threat to life or bodily function.  Anticoagulation decisions managed     DVT prophylaxis: enoxaparin (LOVENOX) injection 40 mg Start: 10/22/19 1000  Code Status: Partial code, no intubation, no ventilator, no CPR (BiPAP okay, medical therapies okay, plasmapheresis okay)  Family Communication: Sister at the bedside, brothers by phone    Consultants:   Critical care  Cardiology  Neurology  Procedures:     Antimicrobials:   Ceftriaxone 9/4>>9/6  Doxycycline 9/4 >>  Cefepime 9/6 >>  Culture data:   Blood culture 9/3-no growth to date  Urine culture 9/4-insignificant growth          Subjective: Patient feels "bad", but her breathing is stabilizing.  No fever overnight.  No vomiting, no confusion.  Objective: Vitals:   10/24/19 0755 10/24/19 0835 10/24/19 0841 10/24/19 0842  BP:  (!) 144/72 135/72 135/72  Pulse:  100 99 99  Resp:    (!) 28  Temp:      TempSrc:      SpO2: 96% 96%    Weight:      Height:        Intake/Output Summary (Last 24 hours) at 10/24/2019 0908 Last data filed at 10/24/2019 0516 [place] per 24 hour  Intake 360 ml  Output 1825 ml  Net -1465 ml   Filed Weights   10/21/19 2036  Weight: 92.5 kg    Examination: General appearance: Obese elderly adult female, awake, makes eye contact, in mild respiratory distress.   HEENT: Anicteric, conjunctiva pink, lids and lashes normal. No nasal deformity, discharge, epistaxis.  Lips dry, dentition normal, oropharynx tacky dry, no oral lesions, hearing normal.   Skin: Warm and dry.  No jaundice.  No suspicious rashes or lesions. Cardiac: Tachycardic, irregular, nl S1-S2, no murmurs appreciated.  Capillary refill is brisk.  JVnot visible.  No LE edema.  Radial pulses 2+ and symmetric. Respiratory:  Paradoxical breathing, tachypnea.  Lung sounds diminished bilaterally, no rales or wheezing appreciated.   Abdomen: Abdomen soft.  Nonfocal TTP throughout. No ascites, distension, hepatosplenomegaly.   MSK: No deformities or effusions. Neuro: Awake and alert.  EOMI, moves all extremities with severe generalized weakness. Speech fluent.    Psych: Sensorium intact and responding to questions, attention normal. Affect flat.  Judgment and insight appear back in general delay.    Data Reviewed: I have personally reviewed following labs and imaging studies:  CBC: Recent Labs  Lab 10/21/19 2112 10/23/19 0222 10/24/19 0543  WBC 13.8* 8.7 7.3  NEUTROABS 11.9*  --   --   HGB 14.6 12.0 12.0  HCT 44.3 36.8 37.2  MCV 91.9 93.6 93.5  PLT 150 129* 253   Basic Metabolic Panel: Recent Labs  Lab 10/21/19 2112 10/22/19 0757 10/23/19 0222 10/24/19 0543  NA 134*  --  130* 136  K 3.4*  --  3.9 3.8  CL 98  --  98 102  CO2 24  --  26 27  GLUCOSE 185*  --  126* 126*  BUN 14  --  15 15  CREATININE 0.71  --  0.72 0.81  CALCIUM 8.8*  --  8.3* 8.7*  MG  --  2.1  --   --   PHOS  --  4.0  --   --    GFR: Estimated Creatinine Clearance: 70.4 mL/min (by C-G formula based on SCr of 0.81 mg/dL). Liver Function Tests: Recent Labs  Lab 10/21/19 2112 10/24/19 0543  AST 24 30  ALT 19 17  ALKPHOS 59 47  BILITOT 0.9 0.6  PROT 7.3 7.0  ALBUMIN 3.9 2.8*   No results for input(s): LIPASE, AMYLASE in the last 168 hours. No results for input(s): AMMONIA in the last 168 hours. Coagulation Profile: Recent Labs  Lab 10/21/19 2112 10/23/19 0222  INR 0.9 1.0   Cardiac Enzymes: No results for input(s): CKTOTAL, CKMB, CKMBINDEX, TROPONINI in the last 168 hours. BNP (last 3 results) No results for input(s): PROBNP in the last 8760 hours. HbA1C: No results for input(s): HGBA1C in the last 72 hours. CBG: Recent Labs  Lab 10/24/19 0835  GLUCAP 153*   Lipid Profile: No results for input(s): CHOL,  HDL, LDLCALC, TRIG, CHOLHDL, LDLDIRECT in the last 72 hours. Thyroid Function Tests: Recent Labs    10/22/19 0757  TSH 3.859   Anemia Panel: No results for input(s): VITAMINB12, FOLATE, FERRITIN, TIBC, IRON, RETICCTPCT in the last 72 hours. Urine analysis:    Component Value Date/Time   COLORURINE AMBER (A) 10/22/2019 0834   APPEARANCEUR CLOUDY (A) 10/22/2019 0834   APPEARANCEUR Cloudy (A) 09/07/2019 1151   LABSPEC 1.040 (H) 10/22/2019 0834   LABSPEC 1.019 04/25/2011 0854   PHURINE 5.0 10/22/2019 0834   GLUCOSEU NEGATIVE 10/22/2019 0834   GLUCOSEU NEGATIVE 02/13/2016 1109   HGBUR NEGATIVE 10/22/2019 0834   BILIRUBINUR NEGATIVE 10/22/2019 0834   BILIRUBINUR neg 02/07/2015 1520   BILIRUBINUR Negative 04/25/2011 0854   KETONESUR NEGATIVE 10/22/2019 0834   PROTEINUR >=300 (A) 10/22/2019 0834   UROBILINOGEN 0.2 02/13/2016 1109   NITRITE NEGATIVE 10/22/2019 0834   LEUKOCYTESUR TRACE (A) 10/22/2019 0834   LEUKOCYTESUR 2+ 04/25/2011 0854   Sepsis Labs: @LABRCNTIP (procalcitonin:4,lacticacidven:4)  ) Recent Results (from the past 240 hour(s))  Culture, blood (Routine x 2)     Status: None (Preliminary result)   Collection Time: 10/21/19  9:12 PM   Specimen: BLOOD  Result Value Ref Range Status   Specimen Description BLOOD LEFT ANTECUBITAL  Final   Special Requests   Final    BOTTLES DRAWN AEROBIC AND ANAEROBIC Blood Culture adequate volume   Culture   Final    NO GROWTH 3 DAYS Performed at [facility], [address], [place], [place] [address]    Report Status PENDING  Incomplete  Culture, blood (Routine x 2)     Status: None (Preliminary result)   Collection Time: 10/22/19  1:45 AM   Specimen: BLOOD  Result Value Ref Range Status   Specimen Description BLOOD BLOOD LEFT HAND  Final   Special Requests   Final    BOTTLES DRAWN AEROBIC AND ANAEROBIC Blood Culture results may not be optimal due to an excessive volume of blood received in culture bottles   Culture    Final    NO GROWTH 2 DAYS Performed at [facility], [address] [place], [place] [address]    Report Status PENDING  Incomplete  SARS Coronavirus 2 by RT PCR (hospital order, performed in [place] hospital  lab) Nasopharyngeal Nasopharyngeal Swab     Status: None   Collection Time: 10/22/19  2:04 AM   Specimen: Nasopharyngeal Swab  Result Value Ref Range Status   SARS Coronavirus 2 NEGATIVE NEGATIVE Final    Comment: (NOTE) SARS-CoV-2 target nucleic acids are NOT DETECTED.  The SARS-CoV-2 RNA is generally detectable in upper and lower respiratory specimens during the acute phase of infection. The lowest concentration of SARS-CoV-2 viral copies this assay can detect is 250 copies / mL. A negative result does not preclude SARS-CoV-2 infection and should not be used as the sole basis for treatment or other patient management decisions.  A negative result may occur with improper specimen collection / handling, submission of specimen other than nasopharyngeal swab, presence of viral mutation(s) within the areas targeted by this assay, and inadequate number of viral copies (<250 copies / mL). A negative result must be combined with clinical observations, patient history, and epidemiological information.  Fact Sheet for Patients:   StrictlyIdeas.no  Fact Sheet for Healthcare Providers: BankingDealers.co.za  This test is not yet approved or  cleared by the [place] FDA and has been authorized for detection and/or diagnosis of SARS-CoV-2 by FDA under an Emergency Use Authorization (EUA).  This EUA will remain in effect (meaning this test can be used) for the duration of the COVID-19 declaration under Section 564(b)(1) of the Act, 21 U.S.C. section 360bbb-3(b)(1), unless the authorization is terminated or revoked sooner.  Performed at [facility], [address], [place], [place] [address]   Urine culture      Status: Abnormal   Collection Time: 10/22/19  8:34 AM   Specimen: Urine, Random  Result Value Ref Range Status   Specimen Description   Final    URINE, RANDOM Performed at [facility], [address] [place], [place] [address]    Special Requests   Final    NONE Performed at [facility], [address] [place], [place] [address]    Culture (A)  Final    10,000 COLONIES/mL MULTIPLE SPECIES PRESENT, SUGGEST RECOLLECTION   Report Status 10/23/2019 FINAL  Final         Radiology Studies: US Abdomen Complete  Result Date: 10/23/2019 CLINICAL DATA:  Abdominal pain. The patient had ablation of a lower pole left kidney mass earlier this year. EXAM: ABDOMEN ULTRASOUND COMPLETE COMPARISON:  Abdomen CT dated 06/15/2019. Abdomen ultrasound dated 05/01/2017. FINDINGS: Gallbladder: Surgically absent. Common bile duct: Diameter: 5.0 mm proximally Liver: No focal lesion identified. Within normal limits in parenchymal echogenicity. Portal vein is patent on color Doppler imaging with normal direction of blood flow towards the liver. IVC: No abnormality visualized. Pancreas: Visualized portion unremarkable. Spleen: Size and appearance within normal limits. Right Kidney: Length: 8.0 cm. Diffuse renal cortical thickening with prominent renal sinus fat. Normal echotexture. No hydronephrosis. Left Kidney: Length: 10.9 cm. Normal echotexture. Stable mildly prominent, oval, extrarenal pelvis adjacent to the lower pole. The previously demonstrated lower pole solid mass is not well delineated. Abdominal aorta: No aneurysm visualized. Other findings: None. IMPRESSION: 1. No acute abnormality. 2. The previously demonstrated lower pole left renal mass is not well delineated with ultrasound today. This could be better visualized with pre and postcontrast MRI of the kidneys on an elective outpatient basis if clinically indicated. Electronically Signed   By: Claudie Revering M.D.   On: 10/23/2019 10:56         Scheduled Meds: . vitamin C  1,000 mg Oral QHS  . B-complex  with vitamin C  1 tablet Oral QPM  . calcium citrate-vitamin D  1 tablet Oral Daily  . diltiazem  180 mg Oral Daily  . doxycycline  100 mg Oral Q12H  . DULoxetine  30 mg Oral Daily  . enoxaparin (LOVENOX) injection  40 mg Subcutaneous Q24H  . fesoterodine  4 mg Oral Daily  . levothyroxine  75 mcg Oral Q0600  . pantoprazole  40 mg Oral Daily  . pyridostigmine  60 mg Oral BID  . risperiDONE  2 mg Oral QHS  . sodium chloride flush  10-40 mL Intracatheter Q12H  . sucralfate  1 g Oral BID  . vitamin E  400 Units Oral Q24H   Continuous Infusions: . cefTRIAXone (ROCEPHIN)  IV 2 g (10/23/19 2234)  . famotidine (PEPCID) IV    . Immune Globulin 10% 220 mL/hr at 10/23/19 1941     LOS: 2 days    Time spent: 35 minutes    Edwin Dada, MD [facility] 10/24/2019, 9:08 AM     Please page though [place] or Epic secure chat:  For [employer], Adult nurse

## 2019-10-24 NOTE — Progress Notes (Signed)
   10/24/19 0741  Assess: MEWS Score  Temp 99.1 F (37.3 C)  BP (!) 143/85  Pulse Rate (!) 102  Resp (!) 26  SpO2 98 %  O2 Device Nasal Cannula  O2 Flow Rate (L/min) 4 L/min  Assess: MEWS Score  MEWS Temp 0  MEWS Systolic 0  MEWS Pulse 1  MEWS RR 2  MEWS LOC 0  MEWS Score 3  MEWS Score Color Yellow  Assess: if the MEWS score is Yellow or Red  Were vital signs taken at a resting state? Yes  Focused Assessment No change from prior assessment  Early Detection of Sepsis Score *See Row Information* Low  MEWS guidelines implemented *See Row Information* Yes  Treat  MEWS Interventions Escalated (See documentation below);Consulted Respiratory Therapy  Pain Scale 0-10  Pain Score 10  Pain Location Chest  Pain Orientation Mid  Pain Radiating Towards back  Take Vital Signs  Increase Vital Sign Frequency  Yellow: Q 2hr X 2 then Q 4hr X 2, if remains yellow, continue Q 4hrs  Escalate  MEWS: Escalate Yellow: discuss with charge nurse/RN and consider discussing with provider and RRT   Md came assess patient. New orders received. Pt mental status at baseline.

## 2019-10-24 NOTE — Progress Notes (Signed)
Rapid Response Event Note   Reason for Call : chest pain   Initial Focused Assessment: This RN called to bedside by primary RN due to patient complaints of chest pain. Pt reports substernal chest pain without radiation, sharp in nature, 8/10. Patient admitted with PNA, with hx of MG. Per bedside RN, pt began to complain of chest pain during shift change. MD paged, not at bedside at present. EKG obtained prior to this RN's arrival, SR. Pt on 2L [place], RR in mid-30s, however patient reports no SOB. This RN gave patient 3 doses of nitro with no change in chest pain. CXR ordered. Dr. Loleta Books to bedside, stated that pt looks same as she did yesterday. Ordered lab work and ABG.       Interventions: Nitro, CXR, lab work, [employer] of Care: Patient to get ABG, if worse than prior, will come to ICU for intubation. However, currently, patient is to remain on unit as she is stable.     Event Summary:   MD Notified: [place] Call Time: 0830 Arrival Time: 8871 End Time: 9597  Mila Merry, RN

## 2019-10-25 LAB — CBC
HCT: 35.9 % — ABNORMAL LOW (ref 36.0–46.0)
Hemoglobin: 11.7 g/dL — ABNORMAL LOW (ref 12.0–15.0)
MCH: 30 pg (ref 26.0–34.0)
MCHC: 32.6 g/dL (ref 30.0–36.0)
MCV: 92.1 fL (ref 80.0–100.0)
Platelets: 150 10*3/uL (ref 150–400)
RBC: 3.9 MIL/uL (ref 3.87–5.11)
RDW: 14.4 % (ref 11.5–15.5)
WBC: 7.8 10*3/uL (ref 4.0–10.5)
nRBC: 0 % (ref 0.0–0.2)

## 2019-10-25 LAB — BASIC METABOLIC PANEL
Anion gap: 6 (ref 5–15)
BUN: 20 mg/dL (ref 8–23)
CO2: 26 mmol/L (ref 22–32)
Calcium: 8.7 mg/dL — ABNORMAL LOW (ref 8.9–10.3)
Chloride: 101 mmol/L (ref 98–111)
Creatinine, Ser: 0.75 mg/dL (ref 0.44–1.00)
GFR calc Af Amer: >60 mL/min (ref >60)
GFR calc non Af Amer: >60 mL/min (ref >60)
Glucose, Bld: 122 mg/dL — ABNORMAL HIGH (ref 70–99)
Potassium: 3.9 mmol/L (ref 3.5–5.1)
Sodium: 133 mmol/L — ABNORMAL LOW (ref 135–145)

## 2019-10-25 LAB — ECHOCARDIOGRAM COMPLETE
AR max vel: 2.62 cm2
AV Area VTI: 3.31 cm2
AV Area mean vel: 3.08 cm2
AV Mean grad: 3 mmHg
AV Peak grad: 5.5 mmHg
Ao pk vel: 1.17 m/s
Area-P 1/2: 5.66 cm2
S' Lateral: 2.31 cm

## 2019-10-25 MED ORDER — FUROSEMIDE 10 MG/ML IJ SOLN
40.0000 mg | Freq: Once | INTRAMUSCULAR | Status: AC
  Administered 2019-10-25: 40 mg via INTRAVENOUS
  Filled 2019-10-25: qty 4

## 2019-10-25 MED ORDER — ENOXAPARIN SODIUM 40 MG/0.4ML ~~LOC~~ SOLN
40.0000 mg | Freq: Two times a day (BID) | SUBCUTANEOUS | Status: DC
  Administered 2019-10-25: 40 mg via SUBCUTANEOUS
  Filled 2019-10-25: qty 0.4

## 2019-10-25 NOTE — Progress Notes (Signed)
Patient tolerate IVIG without difficulties noted. Tolerating BiPAP  Without any issues. Oral care provided. MEWS is yellow due to HR of 109. HOB elevated. Call light kept within reach. Endorsed to receiving RN.

## 2019-10-25 NOTE — Progress Notes (Signed)
PT Cancellation Note  Patient Details Name: Madison Burns MRN: [id number] DOB: February 08, 1951   Cancelled Treatment:    Reason Eval/Treat Not Completed: Other (comment).  PT consult received.  Chart reviewed.  Pt sitting on edge of bed with tray (on tray table) in front of pt and visitor present--pt recently just started eating (pt taken off of bipap and placed on nasal cannula in order to eat).  Will re-attempt PT evaluation at a later date/time as able.  Leitha Bleak, PT 10/25/19, 3:41 PM

## 2019-10-25 NOTE — Progress Notes (Signed)
Patient taken off of bipap and placed on [place] to eat.

## 2019-10-25 NOTE — Progress Notes (Signed)
NIF: -23 

## 2019-10-25 NOTE — Evaluation (Signed)
Occupational Therapy Evaluation Patient Details Name: Madison Burns MRN: [id number] DOB: 09-Jan-1951 Today's Date: 10/25/2019    History of Present Illness Madison Burns is a 69 y.o. female with medical history significant for hypertension, hypothyroidism, obstructive sleep apnea, myasthenia gravis, thymoma status post radiation therapy 4 months ago as well as renal cell carcinoma who presents to the emergency room for evaluation of shortness of breath associated with right-sided chest pain and a fever.   Clinical Impression   Pt was seen for OT evaluation this date. Prior to hospital admission, pt reports ambulating without AD, indep with toileting and dressing, and requiring assist from sister whom she lives with for seated shower, meal prep, housekeeping tasks, medication mgt, and transportation. Pt reports enjoying watching TV. RN and respiratory transitioned pt from BiPAP to 2L O2, cleared to work with therapy per RN. O2 sats on 2L 95-100% throughout session, HR 100's-112, RR 20's-34 with exertion. Recovers quickly. Pt performed bed mobility with supervision for safety, CGA for ADL transfer to [facility]. Min A for thoroughness for pericare after large BM. Pt follows commands with cues, increased processing time for questions. Vitals stable with exertion. RN notified at end of session. Currently pt demonstrates impairments as described below (See OT problem list) which functionally limit her ability to perform ADL/self-care tasks at baseline independence.  Pt would benefit from skilled OT to address noted impairments and functional limitations (see below for any additional details) in order to maximize safety and independence while minimizing falls risk and caregiver burden. Upon hospital discharge, recommend HHOT to maximize pt safety and return to functional independence during meaningful occupations of daily life.     Follow Up Recommendations  Home health OT;Supervision/Assistance - 24 hour     Equipment Recommendations  None recommended by OT    Recommendations for Other Services       Precautions / Restrictions Precautions Precautions: Fall Restrictions Weight Bearing Restrictions: No      Mobility Bed Mobility Overal bed mobility: Modified Independent             General bed mobility comments: supervision for safety given first time working with pt, but able to perform without assist and without difficulty  Transfers Overall transfer level: Needs assistance Equipment used: None Transfers: Sit to/from Stand Sit to Stand: Min guard         General transfer comment: CGA for safety, tends to stand quickly    Balance Overall balance assessment: Mild deficits observed, not formally tested                                         ADL either performed or assessed with clinical judgement   ADL Overall ADL's : Needs assistance/impaired                                       General ADL Comments: Pt currently requires CGA for ADL transfers, set up and Min A for thoroughness for pericare, CGA to PRN Min A for LB ADL tasks. Set up and supervision for seated grooming tasks. Cues for safety.     Vision Baseline Vision/History: No visual deficits Patient Visual Report: No change from baseline       Perception     Praxis      Pertinent Vitals/Pain Pain Assessment: 0-10  Pain Score: 10-Worst pain ever Pain Location: pt reports 10/10 sternal/chest pain; RN in room at time and per RN, MD not concerned about chest pain for cardiac reason Pain Descriptors / Indicators: Aching Pain Intervention(s): Limited activity within patient's tolerance;Monitored during session;Repositioned     Hand Dominance Right   Extremity/Trunk Assessment Upper Extremity Assessment Upper Extremity Assessment: Generalized weakness (LUE slightly weaker than RUE, sensation and FMC intact bilat)   Lower Extremity Assessment Lower Extremity  Assessment: Generalized weakness       Communication     Cognition Arousal/Alertness: Awake/alert Behavior During Therapy: Flat affect Overall Cognitive Status: No family/caregiver present to determine baseline cognitive functioning                                 General Comments: Per RN pt with developmental delay; pt does require increased processing time, alert and oriented to self and place. Pt follows simple commands with increased processing time   General Comments  RN and respiratory transitioned pt from BiPAP to 2L O2, cleared to work with therapy per RN. O2 sats on 2L 95-100% throughout session, HR 100's-112, RR 20's-34 with exertion. Recovers quickly. RN notified of vitals after session.    Exercises Other Exercises Other Exercises: toileting with SPT to [facility] requiring CGA for safety. Min A for pericare thoroughness after large BM (RN notified of BM and urine)   Shoulder Instructions      Home Living Family/patient expects to be discharged to:: Private residence Living Arrangements: Other relatives (sister [place]) Available Help at Discharge: Family Type of Home: House                       Home Equipment: Shower seat   Additional Comments: no family present to verify info provided      Prior Functioning/Environment Level of Independence: Needs assistance  Gait / Transfers Assistance Needed: pt reports ambulating without AD at home ADL's / Homemaking Assistance Needed: pt reports that sister [name] assists with seated shower, cleaning, meals, medications, and transportation. Pt reports she is able to performing toileting and dressing without assist Communication / Swallowing Assistance Needed: Pt denies falls          OT Problem List: Decreased strength;Decreased cognition;Decreased safety awareness;Decreased activity tolerance;Impaired balance (sitting and/or standing);Pain      OT Treatment/Interventions: Self-care/ADL  training;Therapeutic exercise;Therapeutic activities;DME and/or AE instruction;Patient/family education;Balance training    OT Goals(Current goals can be found in the care plan section) Acute Rehab OT Goals Patient Stated Goal: get better and go home with sister OT Goal Formulation: With patient Time For Goal Achievement: 11/08/19 Potential to Achieve Goals: Good ADL Goals Pt Will Perform Lower Body Dressing: with modified independence;sit to/from stand Pt Will Transfer to Toilet: with supervision;ambulating;regular height toilet (LRAD for amb if needed)  OT Frequency: Min 1X/week   Barriers to D/C:            Co-evaluation              AM-PAC OT "6 Clicks" Daily Activity     Outcome Measure Help from another person eating meals?: None Help from another person taking care of personal grooming?: None Help from another person toileting, which includes using toliet, bedpan, or urinal?: A Little Help from another person bathing (including washing, rinsing, drying)?: A Little Help from another person to put on and taking off regular upper body clothing?: None Help from another  person to put on and taking off regular lower body clothing?: A Little 6 Click Score: 21   End of Session Equipment Utilized During Treatment: Oxygen (2L O2) Nurse Communication: Mobility status;Other (comment) (vitals during exertion, large BM)  Activity Tolerance: Patient tolerated treatment well Patient left: in bed;with call bell/phone within reach;with bed alarm set  OT Visit Diagnosis: Other abnormalities of gait and mobility (R26.89);Muscle weakness (generalized) (M62.81)                Time: 8177-1165 OT Time Calculation (min): 35 min Charges:  OT General Charges $OT Visit: 1 Visit OT Evaluation $OT Eval Moderate Complexity: 1 Mod OT Treatments $Self Care/Home Management : 8-22 mins  Jeni Salles, MPH, MS, OTR/L ascom [phone] 10/25/19, 1:46 PM

## 2019-10-25 NOTE — Progress Notes (Signed)
[facility]    SUBJECTIVE: The patient had mild chest pain yesterday, but denies recurrence today.   Vitals:   10/25/19 0400 10/25/19 0500 10/25/19 0729 10/25/19 0754  BP: 107/71  119/75   Pulse: 96  91 99  Resp: 14  18 18   Temp:  98.4 F (36.9 C) 98.7 F (37.1 C)   TempSrc:  Oral Oral   SpO2: 97%  98% 98%  Weight:  111.9 kg    Height:         Intake/Output Summary (Last 24 hours) at 10/25/2019 1937 Last data filed at 10/25/2019 0656 [place] per 24 hour  Intake 300 ml  Output 1100 ml  Net -800 ml      PHYSICAL EXAM  General: Ill-appearing, lying in bed, on BiPAP, in no acute distress HEENT:  Normocephalic and atramatic Neck:  No JVD.  Lungs: BiPAP, no wheezing, speaking in full sentences Heart: HRRR . Normal S1 and S2 without gallops or murmurs.  Abdomen: Bowel sounds are positive, abdomen soft and non-tender  Msk:  Back normal, normal gait. Normal strength and tone for age. Extremities: No clubbing, cyanosis or edema.   Neuro: Alert and oriented X 3. Psych:  Good affect, responds appropriately   LABS: Basic Metabolic Panel: Recent Labs    10/24/19 0543 10/25/19 0511  NA 136 133*  K 3.8 3.9  CL 102 101  CO2 27 26  GLUCOSE 126* 122*  BUN 15 20  CREATININE 0.81 0.75  CALCIUM 8.7* 8.7*   Liver Function Tests: Recent Labs    10/24/19 0543  AST 30  ALT 17  ALKPHOS 47  BILITOT 0.6  PROT 7.0  ALBUMIN 2.8*   No results for input(s): LIPASE, AMYLASE in the last 72 hours. CBC: Recent Labs    10/24/19 0543 10/25/19 0511  WBC 7.3 7.8  HGB 12.0 11.7*  HCT 37.2 35.9*  MCV 93.5 92.1  PLT 154 150   Cardiac Enzymes: No results for input(s): CKTOTAL, CKMB, CKMBINDEX, TROPONINI in the last 72 hours. BNP: Invalid input(s): POCBNP D-Dimer: No results for input(s): DDIMER in the last 72 hours. Hemoglobin A1C: No results for input(s): HGBA1C in the last 72 hours. Fasting Lipid Panel: No results for input(s): CHOL, HDL, LDLCALC, TRIG, CHOLHDL,  LDLDIRECT in the last 72 hours. Thyroid Function Tests: No results for input(s): TSH, T4TOTAL, T3FREE, THYROIDAB in the last 72 hours.  Invalid input(s): FREET3 Anemia Panel: No results for input(s): VITAMINB12, FOLATE, FERRITIN, TIBC, IRON, RETICCTPCT in the last 72 hours.  US Abdomen Complete  Result Date: 10/23/2019 CLINICAL DATA:  Abdominal pain. The patient had ablation of a lower pole left kidney mass earlier this year. EXAM: ABDOMEN ULTRASOUND COMPLETE COMPARISON:  Abdomen CT dated 06/15/2019. Abdomen ultrasound dated 05/01/2017. FINDINGS: Gallbladder: Surgically absent. Common bile duct: Diameter: 5.0 mm proximally Liver: No focal lesion identified. Within normal limits in parenchymal echogenicity. Portal vein is patent on color Doppler imaging with normal direction of blood flow towards the liver. IVC: No abnormality visualized. Pancreas: Visualized portion unremarkable. Spleen: Size and appearance within normal limits. Right Kidney: Length: 8.0 cm. Diffuse renal cortical thickening with prominent renal sinus fat. Normal echotexture. No hydronephrosis. Left Kidney: Length: 10.9 cm. Normal echotexture. Stable mildly prominent, oval, extrarenal pelvis adjacent to the lower pole. The previously demonstrated lower pole solid mass is not well delineated. Abdominal aorta: No aneurysm visualized. Other findings: None. IMPRESSION: 1. No acute abnormality. 2. The previously demonstrated lower pole left renal mass is not well delineated with ultrasound  today. This could be better visualized with pre and postcontrast MRI of the kidneys on an elective outpatient basis if clinically indicated. Electronically Signed   By: Claudie Revering M.D.   On: 10/23/2019 10:56   DG Chest Port 1 View  Result Date: 10/24/2019 CLINICAL DATA:  Acute respiratory distress. EXAM: PORTABLE CHEST 1 VIEW COMPARISON:  October 21, 2019. FINDINGS: Stable cardiomediastinal silhouette. Mild central pulmonary vascular congestion is noted.  No pneumothorax is noted. Hypoinflation of the lungs is noted. Mild left basilar atelectasis or infiltrate is noted with associated pleural effusion. Bony thorax is unremarkable. IMPRESSION: Hypoinflation of the lungs. Mild left basilar atelectasis or infiltrate is noted with associated pleural effusion. Mild central pulmonary vascular congestion is noted. Electronically Signed   By: Marijo Conception M.D.   On: 10/24/2019 09:11     Echo pending  TELEMETRY: sinus rhythm  ASSESSMENT AND PLAN:  Principal Problem:   Sepsis ([place]) Active Problems:   Hypertension   Obesity (BMI 30-39.9)   MG with exacerbation (myasthenia gravis) (HCC)   OSA (obstructive sleep apnea)   Left renal mass   Hypothyroidism   Atrial fibrillation with RVR ([place])   Community acquired pneumonia   Pressure injury of skin    1. Paroxysmal atrial fibrillationwith rapid ventricular rate,brief episode in the setting of left lower lobe pneumonia and sepsis, currently in sinus rhythm onCardizem CD 180 mg daily 2.Lower lobe pneumonia/sepsis 3.  Chest pain, atypical, sharp and pleuritic in nature, likely due to underlying pneumonia, not cardiac in nature. ECG yesterday nondiagnostic. Troponin flat without significant delta (24, 69, 54, 60, 62)  Recommendations: 1.  Agree with current therapy 2.Defer chronic anticoagulation at this time, since atrial fibrillation occurred during a reversible condition (left lower lobe pneumonia/sepsis), has remained in sinus rhythm on Cardizem 3. Continue Cardizem CD 180 mg daily 4.Review 2D echocardiogram    Clabe Seal, PA-C 10/25/2019 9:24 AM

## 2019-10-25 NOTE — Care Management Important Message (Signed)
Important Message  Patient Details  Name: Madison Burns MRN: [id number] Date of Birth: 10/05/1950   Medicare Important Message Given:  Yes     Dannette Barbara 10/25/2019, 12:00 PM

## 2019-10-25 NOTE — Progress Notes (Signed)
   10/25/19 1425  Assess: MEWS Score  Pulse Rate (!) 108  Resp (!) 24  SpO2 96 %  Assess: MEWS Score  MEWS Temp 0  MEWS Systolic 0  MEWS Pulse 1  MEWS RR 1  MEWS LOC 0  MEWS Score 2  MEWS Score Color Yellow  Assess: if the MEWS score is Yellow or Red  Were vital signs taken at a resting state? Yes  Focused Assessment Change from prior assessment (see assessment flowsheet)  Early Detection of Sepsis Score *See Row Information* Medium  MEWS guidelines implemented *See Row Information* Yes  Treat  MEWS Interventions Administered scheduled meds/treatments;Other (Comment) (MD already aware)  Pain Scale 0-10  Pain Score 0  Take Vital Signs  Increase Vital Sign Frequency  Yellow: Q 2hr X 2 then Q 4hr X 2, if remains yellow, continue Q 4hrs  Escalate  MEWS: Escalate Yellow: discuss with charge nurse/RN and consider discussing with provider and RRT  Notify: Charge Nurse/RN  Name of Charge Nurse/RN Notified [name], RN  Date Charge Nurse/RN Notified 10/25/19  Time Charge Nurse/RN Notified 1430  Document  Patient Outcome Stabilized after interventions  Progress note created (see row info) Yes

## 2019-10-25 NOTE — Progress Notes (Signed)
Madison Burns PROGRESS NOTE    Madison Burns  ZDG:[id number] DOB: 1950/07/30 DOA: 10/22/2019 PCP: Einar Pheasant, MD      Brief Narrative:  Madison Burns is a 69 y.o. F with developmental delay, MG on pyridostigmine, invasive thymoma s/p radiation on carboplatin this year, renal cancer s/p cryoablation, OSA on CPAP,  hx seizures not currently on AED, and hypothyroidism who presented with chest pain and cough.  In the ER, found to have leukocytosis, left base opacity on chest imaging.  Neurology consulted given concern for MG flare.  Started on antibiotics, IVIG.            Assessment & Plan:  Sepsis due to community-acquired pneumonia, left lower lobe Patient presented with tachypnea and tachycardia as well as leukocytosis.  Started on empiric antibiotics and admitted.   Patient seemed to have worsening respiratory status, felt to be from neuromuscular weakness.  Neuro consulted, BiPAP started and IVIG started.  Antibiotics escalated. Failed to obtain sputum culture.  -Continue doxycycline, cefepime  -Obtain sputum culture -Continue BiPAP -Consult neurology  -Lasix x1 today    Myasthenia gravis with possible flare Patient had 1 flare of myasthenia in 2011 has been stable on Mestinon since then.  However here, she appears to have reduced NIF (-20 only) and clinically to have reduced chest wall movement.  Neurology were consulted.  No plasmapheresis available here, but aggressive and appropriate treatment for MG crisis with IVIG was started  NIFs not being documented today. -Continue IVIG  -Continue pyridostigmine TID -Continue serial NIFs and FVCs q6hrs -Consult Neurology and Critical Care  -Continue noninvasive positive pressure ventilation     New onset atrial fibrillation with RVR Paroxysmal by definition. CHA2DS2-VASc 2. Cardiology have been consulted and recommend no anticoagluation at this time.    Initially placed on diltiazem  drip, and converted back to sinus rhythm.  TSH normal. -Continue oral diltiazem -Consult cardiology, appreciate cares   Chest pain Patient had an episode of chest pain this morning.  Serial troponins normal, ECG personally reviewed, without ST changes  Hypothyroidism -Continue levothyroxine  Invasive thymoma  Renal cancer  Sleep apnea -Continue BiPAP   Obesity BMI 37  Depression and cognitive delay -Hold duloxetine, Risperdal - Hold fesoterodine for bladder  GERD/PUD -Continue PPI, sucralfate  Hyponatremia Mild, asymptomatic           Disposition: Status is: Inpatient  Remains inpatient appropriate because:Inpatient level of care appropriate due to severity of illness   Dispo: The patient is from: Home              Anticipated d/c is to: Home              Anticipated d/c date is: 3 days              Patient currently is not medically stable to d/c.     The patient presented with symptoms of sepsis, and possible myasthenia flare.    She had some initial improvement with antibiotics alone, but over the subsequent 24 hours, her respiratory status worsened paradoxical breathing, hypoxia on ABG after BiPAP overnight and severe tachcyardia.  Aftera  Long discussion with family, she was made DNI. (it is my opinion that with her current respiratory failure, she would not be able to be liberated from a ventilator if she were intubated, and would likely (if she survivied) require tracheostomy and lifelong ventilator).  We will complete 5 days IVIG and serial NIFs,FVCs.   Continuous BiPAP for now.  Monitor clinical response to IVIG.  If no response to IVIG, will transfer to [facility] for plasmapheresis.  Oncology are consulted.         MDM: The below labs and imaging reports were reviewed and summarized above.  Medication management as above.  This is a severe illness with threat to life or bodily function.        DVT prophylaxis: enoxaparin (LOVENOX) injection  40 mg Start: 10/25/19 2200  Code Status: Partial code, no intubation, no ventilator, no CPR (BiPAP okay, medical therapies okay, plasmapheresis okay)  Family Communication: Called to sister by phone    Consultants:   Critical care  Cardiology  Neurology  Procedures:     Antimicrobials:   Ceftriaxone 9/4>>9/6  Doxycycline 9/4 >>  Cefepime 9/6 >>  Culture data:   Blood culture 9/3-no growth to date  Urine culture 9/4-insignificant growth          Subjective: Patient feels okay.  Able to be comfortable off BiPAP for a while.   No vomiting, no confusion.  Objective: Vitals:   10/25/19 1700 10/25/19 1800 10/25/19 1824 10/25/19 1900  BP: 129/69 125/87 (!) 156/97 121/83  Pulse: 94 (!) 106 (!) 109 93  Resp: 18 18 20  (!) 22  Temp:   97.9 F (36.6 C)   TempSrc:   Axillary   SpO2: 96% 92%  97%  Weight:      Height:        Intake/Output Summary (Last 24 hours) at 10/25/2019 1943 Last data filed at 10/25/2019 1850 Gravois per 24 hour  Intake 850 ml  Output 4000 ml  Net -3150 ml   Filed Weights   10/21/19 2036 10/25/19 0500  Weight: 92.5 kg 111.9 kg    Examination: General appearance: Obese elderly adult female, awake, makes eye contact, in mild respiratory distress.   HEENT: Anicteric, conjunctiva pink, lids and lashes normal. No nasal deformity, discharge, epistaxis.  Lips dry, dentition normal, oropharynx tacky dry, no oral lesions, hearing normal.   Skin: Warm and dry.  No jaundice.  No suspicious rashes or lesions. Cardiac: Tachycardic, irregular, nl S1-S2, no murmurs appreciated.  Capillary refill is brisk.  JVnot visible.  No LE edema.  Radial pulses 2+ and symmetric. Respiratory: Paradoxical breathing, tachypnea.  Lung sounds diminished bilaterally, no rales or wheezing appreciated.   Abdomen: Abdomen soft.  Nonfocal TTP throughout. No ascites, distension, hepatosplenomegaly.   MSK: No deformities or effusions. Neuro: Awake and alert.  EOMI, moves  all extremities with severe generalized weakness. Speech fluent.    Psych: Sensorium intact and responding to questions, attention normal. Affect flat.  Judgment and insight appear back in general delay.    Data Reviewed: I have personally reviewed following labs and imaging studies:  CBC: Recent Labs  Lab 10/21/19 2112 10/23/19 0222 10/24/19 0543 10/25/19 0511  WBC 13.8* 8.7 7.3 7.8  NEUTROABS 11.9*  --   --   --   HGB 14.6 12.0 12.0 11.7*  HCT 44.3 36.8 37.2 35.9*  MCV 91.9 93.6 93.5 92.1  PLT 150 129* 154 101   Basic Metabolic Panel: Recent Labs  Lab 10/21/19 2112 10/22/19 0757 10/23/19 0222 10/24/19 0543 10/25/19 0511  NA 134*  --  130* 136 133*  K 3.4*  --  3.9 3.8 3.9  CL 98  --  98 102 101  CO2 24  --  26 27 26   GLUCOSE 185*  --  126* 126* 122*  BUN 14  --  15 15 20  CREATININE 0.71  --  0.72 0.81 0.75  CALCIUM 8.8*  --  8.3* 8.7* 8.7*  MG  --  2.1  --   --   --   PHOS  --  4.0  --   --   --    GFR: Estimated Creatinine Clearance: 79.5 mL/min (by C-G formula based on SCr of 0.75 mg/dL). Liver Function Tests: Recent Labs  Lab 10/21/19 2112 10/24/19 0543  AST 24 30  ALT 19 17  ALKPHOS 59 47  BILITOT 0.9 0.6  PROT 7.3 7.0  ALBUMIN 3.9 2.8*   No results for input(s): LIPASE, AMYLASE in the last 168 hours. No results for input(s): AMMONIA in the last 168 hours. Coagulation Profile: Recent Labs  Lab 10/21/19 2112 10/23/19 0222  INR 0.9 1.0   Cardiac Enzymes: No results for input(s): CKTOTAL, CKMB, CKMBINDEX, TROPONINI in the last 168 hours. BNP (last 3 results) No results for input(s): PROBNP in the last 8760 hours. HbA1C: No results for input(s): HGBA1C in the last 72 hours. CBG: Recent Labs  Lab 10/24/19 0835  GLUCAP 153*   Lipid Profile: No results for input(s): CHOL, HDL, LDLCALC, TRIG, CHOLHDL, LDLDIRECT in the last 72 hours. Thyroid Function Tests: No results for input(s): TSH, T4TOTAL, FREET4, T3FREE, THYROIDAB in the last 72  hours. Anemia Panel: No results for input(s): VITAMINB12, FOLATE, FERRITIN, TIBC, IRON, RETICCTPCT in the last 72 hours. Urine analysis:    Component Value Date/Time   COLORURINE AMBER (A) 10/22/2019 0834   APPEARANCEUR CLOUDY (A) 10/22/2019 0834   APPEARANCEUR Cloudy (A) 09/07/2019 1151   LABSPEC 1.040 (H) 10/22/2019 0834   LABSPEC 1.019 04/25/2011 0854   PHURINE 5.0 10/22/2019 0834   GLUCOSEU NEGATIVE 10/22/2019 0834   GLUCOSEU NEGATIVE 02/13/2016 1109   HGBUR NEGATIVE 10/22/2019 0834   BILIRUBINUR NEGATIVE 10/22/2019 0834   BILIRUBINUR neg 02/07/2015 1520   BILIRUBINUR Negative 04/25/2011 0854   KETONESUR NEGATIVE 10/22/2019 0834   PROTEINUR >=300 (A) 10/22/2019 0834   UROBILINOGEN 0.2 02/13/2016 1109   NITRITE NEGATIVE 10/22/2019 0834   LEUKOCYTESUR TRACE (A) 10/22/2019 0834   LEUKOCYTESUR 2+ 04/25/2011 0854   Sepsis Labs: @LABRCNTIP (procalcitonin:4,lacticacidven:4)  ) Recent Results (from the past 240 hour(s))  Culture, blood (Routine x 2)     Status: None (Preliminary result)   Collection Time: 10/21/19  9:12 PM   Specimen: BLOOD  Result Value Ref Range Status   Specimen Description BLOOD LEFT ANTECUBITAL  Final   Special Requests   Final    BOTTLES DRAWN AEROBIC AND ANAEROBIC Blood Culture adequate volume   Culture   Final    NO GROWTH 4 DAYS Performed at [facility], [address] [place], [place] [address]    Report Status PENDING  Incomplete  Culture, blood (Routine x 2)     Status: None (Preliminary result)   Collection Time: 10/22/19  1:45 AM   Specimen: BLOOD  Result Value Ref Range Status   Specimen Description BLOOD BLOOD LEFT HAND  Final   Special Requests   Final    BOTTLES DRAWN AEROBIC AND ANAEROBIC Blood Culture results may not be optimal due to an excessive volume of blood received in culture bottles   Culture   Final    NO GROWTH 3 DAYS Performed at [facility], [address] [place], [place] [address]    Report  Status PENDING  Incomplete  SARS Coronavirus 2 by RT PCR (hospital order, performed in [facility] hospital lab) Nasopharyngeal Nasopharyngeal Swab  Status: None   Collection Time: 10/22/19  2:04 AM   Specimen: Nasopharyngeal Swab  Result Value Ref Range Status   SARS Coronavirus 2 NEGATIVE NEGATIVE Final    Comment: (NOTE) SARS-CoV-2 target nucleic acids are NOT DETECTED.  The SARS-CoV-2 RNA is generally detectable in upper and lower respiratory specimens during the acute phase of infection. The lowest concentration of SARS-CoV-2 viral copies this assay can detect is 250 copies / mL. A negative result does not preclude SARS-CoV-2 infection and should not be used as the sole basis for treatment or other patient management decisions.  A negative result may occur with improper specimen collection / handling, submission of specimen other than nasopharyngeal swab, presence of viral mutation(s) within the areas targeted by this assay, and inadequate number of viral copies (<250 copies / mL). A negative result must be combined with clinical observations, patient history, and epidemiological information.  Fact Sheet for Patients:   StrictlyIdeas.no  Fact Sheet for Healthcare Providers: BankingDealers.co.za  This test is not yet approved or  cleared by the [place] FDA and has been authorized for detection and/or diagnosis of SARS-CoV-2 by FDA under an Emergency Use Authorization (EUA).  This EUA will remain in effect (meaning this test can be used) for the duration of the COVID-19 declaration under Section 564(b)(1) of the Act, 21 U.S.C. section 360bbb-3(b)(1), unless the authorization is terminated or revoked sooner.  Performed at [facility], [address] [place], [place] [address]   Urine culture     Status: Abnormal   Collection Time: 10/22/19  8:34 AM   Specimen: Urine, Random  Result Value Ref Range Status    Specimen Description   Final    URINE, RANDOM Performed at [facility], [address] [place], [place] [address]    Special Requests   Final    NONE Performed at [facility], [address] [place], [place] [address]    Culture (A)  Final    10,000 COLONIES/mL MULTIPLE SPECIES PRESENT, SUGGEST RECOLLECTION   Report Status 10/23/2019 FINAL  Final  MRSA PCR Screening     Status: None   Collection Time: 10/24/19  5:52 PM   Specimen: Nasal Mucosa; Nasopharyngeal  Result Value Ref Range Status   MRSA by PCR NEGATIVE NEGATIVE Final    Comment:        The GeneXpert MRSA Assay (FDA approved for NASAL specimens only), is one component of a comprehensive MRSA colonization surveillance program. It is not intended to diagnose MRSA infection nor to guide or monitor treatment for MRSA infections. Performed at [facility], [address] [place], [place] [address]          Radiology Studies: [facility] Chest Baxter 1 View  Result Date: 10/24/2019 CLINICAL DATA:  Acute respiratory distress. EXAM: PORTABLE CHEST 1 VIEW COMPARISON:  October 21, 2019. FINDINGS: Stable cardiomediastinal silhouette. Mild central pulmonary vascular congestion is noted. No pneumothorax is noted. Hypoinflation of the lungs is noted. Mild left basilar atelectasis or infiltrate is noted with associated pleural effusion. Bony thorax is unremarkable. IMPRESSION: Hypoinflation of the lungs. Mild left basilar atelectasis or infiltrate is noted with associated pleural effusion. Mild central pulmonary vascular congestion is noted. Electronically Signed   By: Marijo Conception M.D.   On: 10/24/2019 09:11   ECHOCARDIOGRAM COMPLETE  Result Date: 10/25/2019    ECHOCARDIOGRAM REPORT   Patient Name:   DENA ESPERANZA Riviere Date of Exam: 10/24/2019 Medical Rec #:  [id number]  Height:       62.0 in Accession #:    [id number]      Weight:       204.0 lb Date of Birth:  [birth year]       BSA:          1.928 m  Patient Age:    4 years        BP:           132/84 mmHg Patient Gender: F               HR:           128 bpm. Exam Location:  [facility] Procedure: 2D Echo, Color Doppler and Cardiac Doppler Indications:     I48.91 Atrial Fibrillation  History:         Patient has no prior history of Echocardiogram examinations.                  Risk Factors:Sleep Apnea, Hypertension and HCL.  Sonographer:     Charmayne Sheer RDCS (AE) Referring Phys:  [id number] Collier Bullock Diagnosing Phys: Serafina Royals MD  Sonographer Comments: Suboptimal parasternal window and no subcostal window. Image acquisition challenging due to patient body habitus, Image acquisition challenging due to respiratory motion and tachycardia. IMPRESSIONS  1. Left ventricular ejection fraction, by estimation, is 70 to 75%. The left ventricle has hyperdynamic function. The left ventricle has no regional wall motion abnormalities. Left ventricular diastolic parameters were normal.  2. Right ventricular systolic function is normal. The right ventricular size is normal. There is normal pulmonary artery systolic pressure.  3. The mitral valve is normal in structure. Trivial mitral valve regurgitation.  4. The aortic valve is normal in structure. Aortic valve regurgitation is not visualized. FINDINGS  Left Ventricle: Left ventricular ejection fraction, by estimation, is 70 to 75%. The left ventricle has hyperdynamic function. The left ventricle has no regional wall motion abnormalities. The left ventricular internal cavity size was small. There is no  left ventricular hypertrophy. Left ventricular diastolic parameters were normal. Right Ventricle: The right ventricular size is normal. No increase in right ventricular wall thickness. Right ventricular systolic function is normal. There is normal pulmonary artery systolic pressure. The tricuspid regurgitant velocity is 2.27 m/s, and  with an assumed right atrial pressure of 10 mmHg, the estimated right ventricular systolic  pressure is 62.1 mmHg. Left Atrium: Left atrial size was normal in size. Right Atrium: Right atrial size was normal in size. Pericardium: There is no evidence of pericardial effusion. Mitral Valve: The mitral valve is normal in structure. Trivial mitral valve regurgitation. MV peak gradient, 3.6 mmHg. The mean mitral valve gradient is 2.0 mmHg. Tricuspid Valve: The tricuspid valve is normal in structure. Tricuspid valve regurgitation is trivial. Aortic Valve: The aortic valve is normal in structure. Aortic valve regurgitation is not visualized. Aortic valve mean gradient measures 3.0 mmHg. Aortic valve peak gradient measures 5.5 mmHg. Aortic valve area, by VTI measures 3.31 cm. Pulmonic Valve: The pulmonic valve was normal in structure. Pulmonic valve regurgitation is not visualized. Aorta: The aortic root and ascending aorta are structurally normal, with no evidence of dilitation. IAS/Shunts: No atrial level shunt detected by color flow Doppler.  LEFT VENTRICLE PLAX 2D LVIDd:         3.83 cm  Diastology LVIDs:         2.31 cm  LV e' lateral:   7.94 cm/s LV PW:         1.06 cm  LV E/e' lateral: 7.1 LV IVS:        0.86 cm  LV e' medial:    7.18 cm/s LVOT diam:     2.20 cm  LV E/e' medial:  7.8 LV SV:         48 LV SV Index:   25 LVOT Area:     3.80 cm  LEFT ATRIUM             Index LA diam:        2.90 cm 1.50 cm/m LA Vol (A2C):   37.5 ml 19.45 ml/m LA Vol (A4C):   39.8 ml 20.64 ml/m LA Biplane Vol: 42.5 ml 22.05 ml/m  AORTIC VALVE                   PULMONIC VALVE AV Area (Vmax):    2.62 cm    PV Vmax:       1.15 m/s AV Area (Vmean):   3.08 cm    PV Vmean:      65.500 cm/s AV Area (VTI):     3.31 cm    PV VTI:        0.155 m AV Vmax:           117.00 cm/s PV Peak grad:  5.3 mmHg AV Vmean:          76.200 cm/s PV Mean grad:  2.0 mmHg AV VTI:            0.146 m AV Peak Grad:      5.5 mmHg AV Mean Grad:      3.0 mmHg LVOT Vmax:         80.50 cm/s LVOT Vmean:        61.800 cm/s LVOT VTI:          0.127 m LVOT/AV  VTI ratio: 0.87  AORTA Ao Root diam: 3.60 cm MITRAL VALVE               TRICUSPID VALVE MV Area (PHT): 5.66 cm    TR Peak grad:   20.6 mmHg MV Peak grad:  3.6 mmHg    TR Vmax:        227.00 cm/s MV Mean grad:  2.0 mmHg MV Vmax:       0.95 m/s    SHUNTS MV Vmean:      63.9 cm/s   Systemic VTI:  0.13 m MV Decel Time: 134 msec    Systemic Diam: 2.20 cm MV E velocity: 56.30 cm/s MV A velocity: 70.30 cm/s MV E/A ratio:  0.80 Serafina Royals MD Electronically signed by Serafina Royals MD Signature Date/Time: 10/25/2019/1:12:06 PM    Final         Scheduled Meds: . vitamin C  1,000 mg Oral QHS  . B-complex with vitamin C  1 tablet Oral QPM  . calcium citrate-vitamin D  1 tablet Oral Daily  . diltiazem  180 mg Oral Daily  . doxycycline  100 mg Oral Q12H  . enoxaparin (LOVENOX) injection  40 mg Subcutaneous Q12H  . levothyroxine  75 mcg Oral Q0600  . pantoprazole  40 mg Oral Daily  . pyridostigmine  60 mg Oral Q8H  . sodium chloride flush  10-40 mL Intracatheter Q12H  . sucralfate  1 g Oral BID  . vitamin E  400 Units Oral Q24H   Continuous Infusions: . ceFEPime (MAXIPIME) IV 2 g (10/25/19 1438)  . Immune Globulin 10% 35 g (10/24/19 2211)     LOS:  3 days    Time spent: 25 minutes    Edwin Dada, MD [facility] 10/25/2019, 7:43 PM     Please page though [employer] or Epic secure chat:  For [employer], Adult nurse

## 2019-10-25 NOTE — Progress Notes (Signed)
Pharmacy Antibiotic Note  Madison Burns is a 69 y.o. female admitted on 10/22/2019 with new onset A.fib and Sepsis secondary to CAP.  Left base opacity on chest imaging. Patient was started on Ceftriaxone and azithromycin. Patient was initially planned to get Vancomycin but was d/c'd after loading dose. Patient was switched to doxycycline. Pharmacy was consulted for Cefepime dosing.  Day 4 IV antibiotics (day 2 cefepime and day 3 doxy), WBC wnl, no fevers, and Scr remains stable.  Plan: Continue Cefepime 2g IV every 8 hours  Monitor renal function and adjust dose as indicated.   Height: 5\' 2"  (157.5 cm) Weight: 111.9 kg (246 lb 12.8 oz) IBW/kg (Calculated) : 50.1  Temp (24hrs), Avg:98.6 F (37 C), Min:98 F (36.7 C), Max:99.5 F (37.5 C)  Recent Labs  Lab 10/21/19 2112 10/22/19 0145 10/23/19 0222 10/24/19 0543 10/25/19 0511  WBC 13.8*  --  8.7 7.3 7.8  CREATININE 0.71  --  0.72 0.81 0.75  LATICACIDVEN 1.8 0.8  --   --   --     Estimated Creatinine Clearance: 79.5 mL/min (by C-G formula based on SCr of 0.75 mg/dL).    No Known Allergies  Antimicrobials this admission: 9/4 ceftriaxone  >> 9/6  9/4 azithromycin >> 9/5 Doxycycline>> 9/6 Cefepime>> 9/6 Vancomycin x1  Microbiology results: 9/3  BCx: NGTD 9/6  MRSA PCR: neg  Thank you for allowing pharmacy to be a part of this patient's care.  Sherilyn Banker, PharmD Pharmacy Resident  10/25/2019 12:41 PM

## 2019-10-26 DIAGNOSIS — D4989 Neoplasm of unspecified behavior of other specified sites: Secondary | ICD-10-CM

## 2019-10-26 DIAGNOSIS — J189 Pneumonia, unspecified organism: Secondary | ICD-10-CM

## 2019-10-26 DIAGNOSIS — R0603 Acute respiratory distress: Secondary | ICD-10-CM

## 2019-10-26 DIAGNOSIS — G7001 Myasthenia gravis with (acute) exacerbation: Secondary | ICD-10-CM

## 2019-10-26 LAB — CULTURE, BLOOD (ROUTINE X 2)
Culture: NO GROWTH
Special Requests: ADEQUATE

## 2019-10-26 LAB — BASIC METABOLIC PANEL
Anion gap: 4 — ABNORMAL LOW (ref 5–15)
BUN: 14 mg/dL (ref 8–23)
CO2: 31 mmol/L (ref 22–32)
Calcium: 8.4 mg/dL — ABNORMAL LOW (ref 8.9–10.3)
Chloride: 100 mmol/L (ref 98–111)
Creatinine, Ser: 0.61 mg/dL (ref 0.44–1.00)
GFR calc Af Amer: >60 mL/min (ref >60)
GFR calc non Af Amer: >60 mL/min (ref >60)
Glucose, Bld: 123 mg/dL — ABNORMAL HIGH (ref 70–99)
Potassium: 3.4 mmol/L — ABNORMAL LOW (ref 3.5–5.1)
Sodium: 135 mmol/L (ref 135–145)

## 2019-10-26 LAB — CBC
HCT: 37.3 % (ref 36.0–46.0)
Hemoglobin: 12.2 g/dL (ref 12.0–15.0)
MCH: 30.5 pg (ref 26.0–34.0)
MCHC: 32.7 g/dL (ref 30.0–36.0)
MCV: 93.3 fL (ref 80.0–100.0)
Platelets: 143 10*3/uL — ABNORMAL LOW (ref 150–400)
RBC: 4 MIL/uL (ref 3.87–5.11)
RDW: 13.8 % (ref 11.5–15.5)
WBC: 6.3 10*3/uL (ref 4.0–10.5)
nRBC: 0 % (ref 0.0–0.2)

## 2019-10-26 MED ORDER — POTASSIUM CHLORIDE CRYS ER 20 MEQ PO TBCR
40.0000 meq | EXTENDED_RELEASE_TABLET | Freq: Once | ORAL | Status: AC
  Administered 2019-10-26: 40 meq via ORAL
  Filled 2019-10-26: qty 2

## 2019-10-26 MED ORDER — ENOXAPARIN SODIUM 40 MG/0.4ML ~~LOC~~ SOLN
40.0000 mg | SUBCUTANEOUS | Status: DC
  Administered 2019-10-26 – 2019-10-27 (×2): 40 mg via SUBCUTANEOUS
  Filled 2019-10-26 (×2): qty 0.4

## 2019-10-26 NOTE — Progress Notes (Signed)
PROGRESS NOTE    ANYELY CUNNING   ZOX:[id number]  DOB: March 21, 1950  PCP: Einar Pheasant, MD    DOA: 10/22/2019 LOS: 4   Brief Narrative   Mrs. Mcquary is a 69 y.o. F with developmental delay, MG on pyridostigmine, invasive thymoma s/p radiation on carboplatin this year, renal cancer s/p cryoablation, OSA on CPAP,  hx seizures not currently on AED, and hypothyroidism who presented with chest pain and cough.   In the ER, found to have leukocytosis, left base opacity on chest imaging.  Neurology consulted given concern for MG flare.  Started on antibiotics, IVIG.     Assessment & Plan   Principal Problem:   Sepsis ([place]) Active Problems:   Hypertension   Obesity (BMI 30-39.9)   MG with exacerbation (myasthenia gravis) (HCC)   OSA (obstructive sleep apnea)   Left renal mass   Hypothyroidism   Atrial fibrillation with RVR ([place])   Community acquired pneumonia   Pressure injury of skin   Sepsis due to community-acquired pneumonia, left lower lobe Sepsis was present on admission as evidenced by tachypnea and tachycardia, leukocytosis and CXR concerning for L base infiltrate (vs atelectasis).  Started on empiric antibiotics and admitted.   She developed worsening respiratory status, felt to be from neuromuscular weakness secondary to MG flare. Neuro consulted, BiPAP started and IVIG started.  Antibiotics escalated. Failed to obtain sputum culture.  -Continue doxycycline, cefepime -Obtain sputum culture -Continue BiPAP -Consult neurology -Lasix x1 given 9/7, monitor volume status, repeat Lasix if needed    Myasthenia gravis with possible flare Patient had 1 flare of myasthenia in 2011 has been stable on Mestinon since then.   Currently appears to have reduced NIF (-20 only) and clinically to have reduced chest wall movement, shallow inspirations. Neurology following. Getting IVIG for MG crisis here. No plasmapheresis available here, will require transfer if this is  needed.  -Continue IVIG -Continue pyridostigmine TID -RT to continue serial NIFs and FVCs q6hrs & chart values -Consults: Neurology and Critical Care -Continue NIPPV    New onset atrial fibrillation with RVR -  Paroxysmal by definition. CHA2DS2-VASc 2. Cardiology consulted, recommend no anticoagluation at this time.   Initially on diltiazem drip, converted back to sinus rhythm.   TSH normal. -Continue oral diltiazem -Consult cardiology   Chest pain - not POA, had episode of chest pain AM of 9/7. Serial troponins normal, ECG without ST changes.  Monitor.  Hypothyroidism - continue levothyroxine.  TSH normal.  Invasive thymoma -   Hx of Renal cancer - no acute issues  Obstructive Sleep apnea - BiPAP ordered   Depression and cognitive delay -  -Hold duloxetine, Risperdal - Hold fesoterodine for bladder  GERD/PUD - continue PPI, sucralfate  Hyponatremia - Mild, asymptomatic.  Follow BMP.    Obesity: Body mass index is 37.97 kg/m.  Complicates overall care and prognosis.  DVT prophylaxis: enoxaparin (LOVENOX) injection 40 mg Start: 10/26/19 2200   Diet:  Diet Orders (From admission, onward)    Start     Ordered   10/25/19 1329  Diet clear liquid Room service appropriate? Yes; Fluid consistency: Thin  Diet effective now       Question Answer Comment  Room service appropriate? Yes   Fluid consistency: Thin      10/25/19 1328            Code Status: Partial Code    Subjective 10/26/19    Patient seen with one of her brothers at bedside today.  She  is on bipap, says tolerating it okay.  Denies feeling short of breath, fever,chills, chest pain.  She desatted when up to use bathroom earlier, recovered on 6 L/min [place].   Disposition Plan & Communication   Status is: Inpatient  Remains inpatient appropriate due to severity of illness with ongoing respiratory failure, on IV medications.  Dispo: The patient is from: home              Anticipated  d/c is to: home              Anticipated d/c date is: 3 days              Patient currently is not medically stable for d/c.   Family Communication: brother was at bedside during encounter today 9/8.    Consults, Procedures, Significant Events   Consultants:   Neurology  Palliative care  Cardiology    Antimicrobials:   Cefepime, Doxycycline    Objective   Vitals:   10/26/19 0500 10/26/19 0600 10/26/19 0618 10/26/19 0800  BP:  129/86  126/83  Pulse: 75 74 88 87  Resp: 14 13 (!) 21 (!) 22  Temp:    97.8 F (36.6 C)  TempSrc:    Oral  SpO2: 95% 92% 95% 96%  Weight:      Height:        Intake/Output Summary (Last 24 hours) at 10/26/2019 1003 Last data filed at 10/26/2019 0600 [place] per 24 hour  Intake 1240 ml  Output 2900 ml  Net -1660 ml   Filed Weights   10/21/19 2036 10/25/19 0500 10/26/19 0400  Weight: 92.5 kg 111.9 kg 94.2 kg    Physical Exam:  General exam: awake, alert, no acute distress, on bipap Respiratory system: shallow inspirations, no wheezes or obvious rhonchi on exam limited by referred bipap sounds.normal respiratory effort at rest. Cardiovascular system: normal S1/S2, RRR, no pedal edema. Gastrointestinal system: soft, NT, ND, +bowel sounds. Central nervous system: no G[place] focal neurologic deficits, normal speech Extremities: moves all, no cyanosis, normal tone Psychiatry: normal mood, congruent affect  Labs   Data Reviewed: I have personally reviewed following labs and imaging studies  CBC: Recent Labs  Lab 10/21/19 2112 10/23/19 0222 10/24/19 0543 10/25/19 0511 10/26/19 0616  WBC 13.8* 8.7 7.3 7.8 6.3  NEUTROABS 11.9*  --   --   --   --   HGB 14.6 12.0 12.0 11.7* 12.2  HCT 44.3 36.8 37.2 35.9* 37.3  MCV 91.9 93.6 93.5 92.1 93.3  PLT 150 129* 154 150 938*   Basic Metabolic Panel: Recent Labs  Lab 10/21/19 2112 10/22/19 0757 10/23/19 0222 10/24/19 0543 10/25/19 0511 10/26/19 0616  NA 134*  --  130* 136 133* 135  K  3.4*  --  3.9 3.8 3.9 3.4*  CL 98  --  98 102 101 100  CO2 24  --  26 27 26 31   GLUCOSE 185*  --  126* 126* 122* 123*  BUN 14  --  15 15 20 14   CREATININE 0.71  --  0.72 0.81 0.75 0.61  CALCIUM 8.8*  --  8.3* 8.7* 8.7* 8.4*  MG  --  2.1  --   --   --   --   PHOS  --  4.0  --   --   --   --    GFR: Estimated Creatinine Clearance: 71.9 mL/min (by C-G formula based on SCr of 0.61 mg/dL). Liver Function Tests: Recent Labs  Lab 10/21/19  2112 10/24/19 0543  AST 24 30  ALT 19 17  ALKPHOS 59 47  BILITOT 0.9 0.6  PROT 7.3 7.0  ALBUMIN 3.9 2.8*   No results for input(s): LIPASE, AMYLASE in the last 168 hours. No results for input(s): AMMONIA in the last 168 hours. Coagulation Profile: Recent Labs  Lab 10/21/19 2112 10/23/19 0222  INR 0.9 1.0   Cardiac Enzymes: No results for input(s): CKTOTAL, CKMB, CKMBINDEX, TROPONINI in the last 168 hours. BNP (last 3 results) No results for input(s): PROBNP in the last 8760 hours. HbA1C: No results for input(s): HGBA1C in the last 72 hours. CBG: Recent Labs  Lab 10/24/19 0835  GLUCAP 153*   Lipid Profile: No results for input(s): CHOL, HDL, LDLCALC, TRIG, CHOLHDL, LDLDIRECT in the last 72 hours. Thyroid Function Tests: No results for input(s): TSH, T4TOTAL, FREET4, T3FREE, THYROIDAB in the last 72 hours. Anemia Panel: No results for input(s): VITAMINB12, FOLATE, FERRITIN, TIBC, IRON, RETICCTPCT in the last 72 hours. Sepsis Labs: Recent Labs  Lab 10/21/19 2112 10/22/19 0145 10/22/19 0757 10/23/19 0222  PROCALCITON  --   --  <0.10 0.13  LATICACIDVEN 1.8 0.8  --   --     Recent Results (from the past 240 hour(s))  Culture, blood (Routine x 2)     Status: None   Collection Time: 10/21/19  9:12 PM   Specimen: BLOOD  Result Value Ref Range Status   Specimen Description BLOOD LEFT ANTECUBITAL  Final   Special Requests   Final    BOTTLES DRAWN AEROBIC AND ANAEROBIC Blood Culture adequate volume   Culture   Final    NO GROWTH 5  DAYS Performed at [facility], [address] [place], [place] [address]    Report Status 10/26/2019 FINAL  Final  Culture, blood (Routine x 2)     Status: None (Preliminary result)   Collection Time: 10/22/19  1:45 AM   Specimen: BLOOD  Result Value Ref Range Status   Specimen Description BLOOD BLOOD LEFT HAND  Final   Special Requests   Final    BOTTLES DRAWN AEROBIC AND ANAEROBIC Blood Culture results may not be optimal due to an excessive volume of blood received in culture bottles   Culture   Final    NO GROWTH 4 DAYS Performed at [facility], [address] [place], [place] [address]    Report Status PENDING  Incomplete  SARS Coronavirus 2 by RT PCR (hospital order, performed in [facility] hospital lab) Nasopharyngeal Nasopharyngeal Swab     Status: None   Collection Time: 10/22/19  2:04 AM   Specimen: Nasopharyngeal Swab  Result Value Ref Range Status   SARS Coronavirus 2 NEGATIVE NEGATIVE Final    Comment: (NOTE) SARS-CoV-2 target nucleic acids are NOT DETECTED.  The SARS-CoV-2 RNA is generally detectable in upper and lower respiratory specimens during the acute phase of infection. The lowest concentration of SARS-CoV-2 viral copies this assay can detect is 250 copies / mL. A negative result does not preclude SARS-CoV-2 infection and should not be used as the sole basis for treatment or other patient management decisions.  A negative result may occur with improper specimen collection / handling, submission of specimen other than nasopharyngeal swab, presence of viral mutation(s) within the areas targeted by this assay, and inadequate number of viral copies (<250 copies / mL). A negative result must be combined with clinical observations, patient history, and epidemiological information.  Fact Sheet for Patients:   StrictlyIdeas.no  Fact  Sheet for Healthcare Providers: BankingDealers.co.za  This  test is not yet approved or  cleared by the [place] and has been authorized for detection and/or diagnosis of SARS-CoV-2 by FDA under an Emergency Use Authorization (EUA).  This EUA will remain in effect (meaning this test can be used) for the duration of the COVID-19 declaration under Section 564(b)(1) of the Act, 21 U.S.C. section 360bbb-3(b)(1), unless the authorization is terminated or revoked sooner.  Performed at [facility], [address] [place], [place] [address]   Urine culture     Status: Abnormal   Collection Time: 10/22/19  8:34 AM   Specimen: Urine, Random  Result Value Ref Range Status   Specimen Description   Final    URINE, RANDOM Performed at [facility], [address] [place], [place] [address]    Special Requests   Final    NONE Performed at [facility], [address] [place], [place] [address]    Culture (A)  Final    10,000 COLONIES/mL MULTIPLE SPECIES PRESENT, SUGGEST RECOLLECTION   Report Status 10/23/2019 FINAL  Final  MRSA PCR Screening     Status: None   Collection Time: 10/24/19  5:52 PM   Specimen: Nasal Mucosa; Nasopharyngeal  Result Value Ref Range Status   MRSA by PCR NEGATIVE NEGATIVE Final    Comment:        The GeneXpert MRSA Assay (FDA approved for NASAL specimens only), is one component of a comprehensive MRSA colonization surveillance program. It is not intended to diagnose MRSA infection nor to guide or monitor treatment for MRSA infections. Performed at [facility], [address] [place], [place] [address]       Imaging Studies   ECHOCARDIOGRAM COMPLETE  Result Date: 10/25/2019    ECHOCARDIOGRAM REPORT   Patient Name:   ALANI SABBAGH Yett Date of Exam: 10/24/2019 Medical Rec #:  [id number]       Height:       62.0 in Accession #:    [id number]      Weight:       204.0 lb Date of Birth:  10-Sep-1950       BSA:          1.928 m Patient Age:    75 years        BP:            132/84 mmHg Patient Gender: F               HR:           128 bpm. Exam Location:  [facility] Procedure: 2D Echo, Color Doppler and Cardiac Doppler Indications:     I48.91 Atrial Fibrillation  History:         Patient has no prior history of Echocardiogram examinations.                  Risk Factors:Sleep Apnea, Hypertension and HCL.  Sonographer:     Charmayne Sheer RDCS (AE) Referring Phys:  [id number] Collier Bullock Diagnosing Phys: Serafina Royals MD  Sonographer Comments: Suboptimal parasternal window and no subcostal window. Image acquisition challenging due to patient body habitus, Image acquisition challenging due to respiratory motion and tachycardia. IMPRESSIONS  1. Left ventricular ejection fraction, by estimation, is 70 to 75%. The left ventricle has hyperdynamic function. The left ventricle has no regional wall motion abnormalities. Left ventricular diastolic parameters were normal.  2. Right ventricular systolic function is normal. The right ventricular size  is normal. There is normal pulmonary artery systolic pressure.  3. The mitral valve is normal in structure. Trivial mitral valve regurgitation.  4. The aortic valve is normal in structure. Aortic valve regurgitation is not visualized. FINDINGS  Left Ventricle: Left ventricular ejection fraction, by estimation, is 70 to 75%. The left ventricle has hyperdynamic function. The left ventricle has no regional wall motion abnormalities. The left ventricular internal cavity size was small. There is no  left ventricular hypertrophy. Left ventricular diastolic parameters were normal. Right Ventricle: The right ventricular size is normal. No increase in right ventricular wall thickness. Right ventricular systolic function is normal. There is normal pulmonary artery systolic pressure. The tricuspid regurgitant velocity is 2.27 m/s, and  with an assumed right atrial pressure of 10 mmHg, the estimated right ventricular systolic pressure is 35.5 mmHg. Left Atrium: Left atrial  size was normal in size. Right Atrium: Right atrial size was normal in size. Pericardium: There is no evidence of pericardial effusion. Mitral Valve: The mitral valve is normal in structure. Trivial mitral valve regurgitation. MV peak gradient, 3.6 mmHg. The mean mitral valve gradient is 2.0 mmHg. Tricuspid Valve: The tricuspid valve is normal in structure. Tricuspid valve regurgitation is trivial. Aortic Valve: The aortic valve is normal in structure. Aortic valve regurgitation is not visualized. Aortic valve mean gradient measures 3.0 mmHg. Aortic valve peak gradient measures 5.5 mmHg. Aortic valve area, by VTI measures 3.31 cm. Pulmonic Valve: The pulmonic valve was normal in structure. Pulmonic valve regurgitation is not visualized. Aorta: The aortic root and ascending aorta are structurally normal, with no evidence of dilitation. IAS/Shunts: No atrial level shunt detected by color flow Doppler.  LEFT VENTRICLE PLAX 2D LVIDd:         3.83 cm  Diastology LVIDs:         2.31 cm  LV e' lateral:   7.94 cm/s LV PW:         1.06 cm  LV E/e' lateral: 7.1 LV IVS:        0.86 cm  LV e' medial:    7.18 cm/s LVOT diam:     2.20 cm  LV E/e' medial:  7.8 LV SV:         48 LV SV Index:   25 LVOT Area:     3.80 cm  LEFT ATRIUM             Index LA diam:        2.90 cm 1.50 cm/m LA Vol (A2C):   37.5 ml 19.45 ml/m LA Vol (A4C):   39.8 ml 20.64 ml/m LA Biplane Vol: 42.5 ml 22.05 ml/m  AORTIC VALVE                   PULMONIC VALVE AV Area (Vmax):    2.62 cm    PV Vmax:       1.15 m/s AV Area (Vmean):   3.08 cm    PV Vmean:      65.500 cm/s AV Area (VTI):     3.31 cm    PV VTI:        0.155 m AV Vmax:           117.00 cm/s PV Peak grad:  5.3 mmHg AV Vmean:          76.200 cm/s PV Mean grad:  2.0 mmHg AV VTI:            0.146 m AV Peak Grad:  5.5 mmHg AV Mean Grad:      3.0 mmHg LVOT Vmax:         80.50 cm/s LVOT Vmean:        61.800 cm/s LVOT VTI:          0.127 m LVOT/AV VTI ratio: 0.87  AORTA Ao Root diam: 3.60 cm  MITRAL VALVE               TRICUSPID VALVE MV Area (PHT): 5.66 cm    TR Peak grad:   20.6 mmHg MV Peak grad:  3.6 mmHg    TR Vmax:        227.00 cm/s MV Mean grad:  2.0 mmHg MV Vmax:       0.95 m/s    SHUNTS MV Vmean:      63.9 cm/s   Systemic VTI:  0.13 m MV Decel Time: 134 msec    Systemic Diam: 2.20 cm MV E velocity: 56.30 cm/s MV A velocity: 70.30 cm/s MV E/A ratio:  0.80 Serafina Royals MD Electronically signed by Serafina Royals MD Signature Date/Time: 10/25/2019/1:12:06 PM    Final      Medications   Scheduled Meds:  vitamin C  1,000 mg Oral QHS   B-complex with vitamin C  1 tablet Oral QPM   calcium citrate-vitamin D  1 tablet Oral Daily   diltiazem  180 mg Oral Daily   doxycycline  100 mg Oral Q12H   enoxaparin (LOVENOX) injection  40 mg Subcutaneous Q24H   levothyroxine  75 mcg Oral Q0600   pantoprazole  40 mg Oral Daily   pyridostigmine  60 mg Oral Q8H   sodium chloride flush  10-40 mL Intracatheter Q12H   sucralfate  1 g Oral BID   vitamin E  400 Units Oral Q24H   Continuous Infusions:  ceFEPime (MAXIPIME) IV 2 g (10/26/19 0538)   Immune Globulin 10% 222 mL/hr at 10/25/19 2318       LOS: 4 days    Time spent: 30 minutes    Ezekiel Slocumb, DO [facility]  10/26/2019, 10:03 AM    If 7PM-7AM, please contact night-coverage. How to contact the [facility] Attending or Consulting provider [place] or covering provider during after hours [place], for this patient?    1. Check the care team in [facility] and look for a) attending/consulting [facility] provider listed and b) the [facility] team listed 2. Log into [email or web address] and use [place]'s universal password to access. If you do not have the password, please contact the hospital operator. 3. Locate the [facility] provider you are looking for under [facility] and page to a number that you can be directly reached. 4. If you still have difficulty reaching the provider, please page the [facility] (Director on Call) for the  Hospitalists listed on amion for assistance.

## 2019-10-26 NOTE — Progress Notes (Signed)
PHARMACIST - PHYSICIAN COMMUNICATION  CONCERNING:  Enoxaparin (Lovenox) for DVT Prophylaxis    RECOMMENDATION: Patient was prescribed enoxaprin 40mg  q24 hours 9/4-9/7 for VTE prophylaxis then switched to enoxaparin 40mg  q12hours 9/7  Filed Weights   10/21/19 2036 10/25/19 0500 10/26/19 0400  Weight: 92.5 kg (204 lb) 111.9 kg (246 lb 12.8 oz) 94.2 kg (207 lb 9.6 oz)    Body mass index is 37.97 kg/m.  Estimated Creatinine Clearance: 71.9 mL/min (by C-G formula based on SCr of 0.61 mg/dL).   Based on [facility] patient is not a candidate for enoxaparin 40mg  every 12 hours due to BMI being <40.  DESCRIPTION: Pharmacy has adjusted enoxaparin dose per [facility] policy.  Patient is now receiving enoxaparin 40mg  every [place], PharmD Pharmacy Resident  10/26/2019 8:31 AM

## 2019-10-26 NOTE — Consult Note (Signed)
Consultation Note Date: 10/26/2019   Patient Name: Madison Burns  DOB: 10/07/1950  MRN: [id number]  Age / Sex: 69 y.o., female  PCP: Einar Pheasant, MD Referring Physician: Ezekiel Slocumb, DO  Reason for Consultation: Establishing goals of care  HPI/Patient Profile: 69 y.o. female  with past medical history of developmental delay, HTN, hypothyroidism, OSA, myasthenia gravis, seizures, thymoma s/p radiation 4 months ago, and [facility] admitted on 10/22/2019 with shortness of breath, R side chest pain, and fever.  Diagnosed with sepsis d/t LLL pna. Worsening respiratory status - thought to be d/t neuromuscular weakness/ MG flare - started on bipap and IVIG. PMT consulted to discuss [place].   Clinical Assessment and Goals of Care: I have reviewed medical records including EPIC notes, labs and imaging, received report from RN, assessed the patient and then met with patient's brother,  to discuss diagnosis prognosis, GOC, EOL wishes, disposition and options.  I introduced Palliative Medicine as specialized medical care for people living with serious illness. It focuses on providing relief from the symptoms and stress of a serious illness. The goal is to improve quality of life for both the patient and the family.  Patient is on room air today - no labored breathing noted. She is alert and interactive.   Patient's brother is at bedside. I have met both brothers however they both tell me patient's sister is caregiver and Media planner. Sister is unavailable today -  Has an extensive medical appointment.   As far as functional and nutritional status, Brother tells me [name] is mostly sedentary - sister provides majority of her care. No concerns about appetite. Cognition seems to be at baseline.  Brother is very encouraged that patient has been off of bipap today. Brother is hopeful she can return home soon. We discuss option of rehab however brother does not seem  to think patient or [name] would want this; however, ultimately this decision is up to sister.   Brother asks I attempt to have more thorough goals of care discussion with patient's sister tomorrow.   Discussed with brother the importance of continued conversation with family and the medical providers regarding overall plan of care and treatment options, ensuring decisions are within the context of the patient's values and GOCs.    Questions and concerns were addressed. The family was encouraged to call with questions or concerns.   Primary Decision Maker NEXT OF KIN - sister [name]  SUMMARY OF RECOMMENDATIONS   - ongoing goals of care - attempting to speak with sister/caregiver [name]  Code Status/Advance Care Planning:  Limited code - bipap only  Prognosis:   Unable to determine  Discharge Planning: To Be Determined      Primary Diagnoses: Present on Admission: . Sepsis ([place]) . Hypertension . OSA (obstructive sleep apnea) . Hypothyroidism . Left renal mass . MG with exacerbation (myasthenia gravis) ([place]) . Obesity (BMI 30-39.9) . Atrial fibrillation with RVR ([place]) . Community acquired pneumonia   I have reviewed the medical record, interviewed the patient and family, and examined the patient. The following aspects are pertinent.  Past Medical History:  Diagnosis Date  . Cancer of kidney ([place]) 2021  . Complication of anesthesia    Myasthenia gravis  . GERD (gastroesophageal reflux disease)   . History of seizure disorder   . Hypercholesterolemia   . Hypertension    no longer has it. wt loss  . Hypothyroidism   . Irritable bowel syndrome   . Myasthenia gravis ([place]) 2011  .  OSA on CPAP   . Skin cancer 2017   Melanoma on neck   Social History   Socioeconomic History  . Marital status: Single    Spouse name: Not on file  . Number of children: 0  . Years of education: Special Ed  . Highest education level: Not on file  Occupational History  . Occupation:  Unemployed  Tobacco Use  . Smoking status: Never Smoker  . Smokeless tobacco: Never Used  Vaping Use  . Vaping Use: Never used  Substance and Sexual Activity  . Alcohol use: No    Alcohol/week: 0.0 standard drinks  . Drug use: No  . Sexual activity: Not Currently  Other Topics Concern  . Not on file  Social History Narrative   06/23/17 lives with sister, [name]   Regular exercise-no   Caffeine Use-yes   Social Determinants of Health   Financial Resource Strain:   . Difficulty of Paying Living Expenses: Not on file  Food Insecurity:   . Worried About Charity fundraiser in the Last Year: Not on file  . Ran Out of Food in the Last Year: Not on file  Transportation Needs:   . Lack of Transportation (Medical): Not on file  . Lack of Transportation (Non-Medical): Not on file  Physical Activity:   . Days of Exercise per Week: Not on file  . Minutes of Exercise per Session: Not on file  Stress:   . Feeling of Stress : Not on file  Social Connections:   . Frequency of Communication with Friends and Family: Not on file  . Frequency of Social Gatherings with Friends and Family: Not on file  . Attends Religious Services: Not on file  . Active Member of Clubs or Organizations: Not on file  . Attends Archivist Meetings: Not on file  . Marital Status: Not on file   Family History  Problem Relation Age of Onset  . Colon cancer Maternal Grandfather        stomach/colon  . Heart disease Father        myocardial infarction  . Hyperlipidemia Father   . Hyperlipidemia Sister   . Diabetes Sister   . Bone cancer Other        cousin  . Alzheimer's disease Mother        maternal aunts  . Skin cancer Mother   . Myasthenia gravis Brother        ocular  . Skin cancer Brother   . Stroke Paternal Grandfather   . Breast cancer Neg Hx    Scheduled Meds: . vitamin C  1,000 mg Oral QHS  . B-complex with vitamin C  1 tablet Oral QPM  . calcium citrate-vitamin D  1 tablet Oral  Daily  . diltiazem  180 mg Oral Daily  . doxycycline  100 mg Oral Q12H  . enoxaparin (LOVENOX) injection  40 mg Subcutaneous Q24H  . levothyroxine  75 mcg Oral Q0600  . pantoprazole  40 mg Oral Daily  . pyridostigmine  60 mg Oral Q8H  . sodium chloride flush  10-40 mL Intracatheter Q12H  . sucralfate  1 g Oral BID  . vitamin E  400 Units Oral Q24H   Continuous Infusions: . ceFEPime (MAXIPIME) IV 2 g (10/26/19 1426)  . Immune Globulin 10% 222 mL/hr at 10/25/19 2318   PRN Meds:.acetaminophen **OR** acetaminophen, EPINEPHrine, ondansetron **OR** ondansetron (ZOFRAN) IV, sodium chloride flush No Known Allergies Review of Systems  Constitutional: Positive for activity change. Negative  for appetite change.  Respiratory: Negative for shortness of breath.     Physical Exam Constitutional:      General: She is not in acute distress. Skin:    General: Skin is warm and dry.  Neurological:     Mental Status: She is alert.     Vital Signs: BP (!) 154/92 (BP Location: Right Arm)   Pulse 91   Temp 97.8 F (36.6 C) (Oral)   Resp 18   Ht 5' 2"  (1.575 m)   Wt 94.2 kg   SpO2 98%   BMI 37.97 kg/m  Pain Scale: 0-10   Pain Score: 0-No pain   SpO2: SpO2: 98 % O2 Device:SpO2: 98 % O2 Flow Rate: .O2 Flow Rate (L/min): 3 L/min  IO: Intake/output summary:   Intake/Output Summary (Last 24 hours) at 10/26/2019 1431 Last data filed at 10/26/2019 1004 [place] per 24 hour  Intake 1720 ml  Output 2550 ml  Net -830 ml    LBM: Last BM Date: 10/25/19 Baseline Weight: Weight: 92.5 kg Most recent weight: Weight: 94.2 kg     Palliative Assessment/Data: PPS 40%    Time Total: 60 minutes Greater than 50%  of this time was spent counseling and coordinating care related to the above assessment and plan.  Juel Burrow, DNP, AGNP-C Palliative Medicine Team [phone] Pager: [phone]

## 2019-10-26 NOTE — Hospital Course (Signed)
Mrs. Frane is a 69 y.o. F with developmental delay, MG on pyridostigmine, invasive thymoma s/p radiation on carboplatin this year, renal cancer s/p cryoablation, OSA on CPAP,  hx seizures not currently on AED, and hypothyroidism who presented with chest pain and cough.   In the ER, found to have leukocytosis, left base opacity on chest imaging.  Neurology consulted given concern for MG flare.  Started on antibiotics, IVIG.

## 2019-10-26 NOTE — Progress Notes (Signed)
Palliative:  Consult received. Chart reviewed. Visited patient. She is unable to participate in [place] discussion r/t bipap use and developmental delay.   Brother at bedside however he defers [place] conversation to sister who patient lives with.   Attempted to call sister [name] - she did not answer - voicemail left with call back number. Will attempt to call back 9/9.  Juel Burrow, DNP, AGNP-C Palliative Medicine Team Team Phone # [phone]  Pager # [phone]  NO CHARGE

## 2019-10-26 NOTE — Progress Notes (Signed)
[facility] Cardiology    SUBJECTIVE: The patient denies chest pain at this time. She reported having pleuritic chest discomfort yesterday under her left breast, but denies recurrence.    Vitals:   10/26/19 0500 10/26/19 0600 10/26/19 0618 10/26/19 0800  BP:  129/86  126/83  Pulse: 75 74 88 87  Resp: 14 13 (!) 21 (!) 22  Temp:    97.8 F (36.6 C)  TempSrc:    Oral  SpO2: 95% 92% 95% 96%  Weight:      Height:         Intake/Output Summary (Last 24 hours) at 10/26/2019 0834 Last data filed at 10/26/2019 0600 Faries per 24 hour  Intake 1250 ml  Output 3600 ml  Net -2350 ml      PHYSICAL EXAM   General: Ill-appearing, lying in bed, on BiPAP, in no acute distress HEENT:  Normocephalic and atramatic Neck:   No JVD.  Lungs: BiPAP, no wheezing, speaking in full sentences Heart: HRRR . Normal S1 and S2 without gallops or murmurs.  Abdomen: Bowel sounds are positive, abdomen soft and non-tender  Msk:  Back normal, normal gait. Normal strength and tone for age. Extremities: No clubbing, cyanosis or edema.   Neuro: Alert and oriented X 3. Psych:  Flat affect, responds appropriately  LABS: Basic Metabolic Panel: Recent Labs    10/25/19 0511 10/26/19 0616  NA 133* 135  K 3.9 3.4*  CL 101 100  CO2 26 31  GLUCOSE 122* 123*  BUN 20 14  CREATININE 0.75 0.61  CALCIUM 8.7* 8.4*   Liver Function Tests: Recent Labs    10/24/19 0543  AST 30  ALT 17  ALKPHOS 47  BILITOT 0.6  PROT 7.0  ALBUMIN 2.8*   No results for input(s): LIPASE, AMYLASE in the last 72 hours. CBC: Recent Labs    10/25/19 0511 10/26/19 0616  WBC 7.8 6.3  HGB 11.7* 12.2  HCT 35.9* 37.3  MCV 92.1 93.3  PLT 150 143*   Cardiac Enzymes: No results for input(s): CKTOTAL, CKMB, CKMBINDEX, TROPONINI in the last 72 hours. BNP: Invalid input(s): POCBNP D-Dimer: No results for input(s): DDIMER in the last 72 hours. Hemoglobin A1C: No results for input(s): HGBA1C in the last 72 hours. Fasting Lipid  Panel: No results for input(s): CHOL, HDL, LDLCALC, TRIG, CHOLHDL, LDLDIRECT in the last 72 hours. Thyroid Function Tests: No results for input(s): TSH, T4TOTAL, T3FREE, THYROIDAB in the last 72 hours.  Invalid input(s): FREET3 Anemia Panel: No results for input(s): VITAMINB12, FOLATE, FERRITIN, TIBC, IRON, RETICCTPCT in the last 72 hours.  DG Chest Port 1 View  Result Date: 10/24/2019 CLINICAL DATA:  Acute respiratory distress. EXAM: PORTABLE CHEST 1 VIEW COMPARISON:  October 21, 2019. FINDINGS: Stable cardiomediastinal silhouette. Mild central pulmonary vascular congestion is noted. No pneumothorax is noted. Hypoinflation of the lungs is noted. Mild left basilar atelectasis or infiltrate is noted with associated pleural effusion. Bony thorax is unremarkable. IMPRESSION: Hypoinflation of the lungs. Mild left basilar atelectasis or infiltrate is noted with associated pleural effusion. Mild central pulmonary vascular congestion is noted. Electronically Signed   By: Marijo Conception M.D.   On: 10/24/2019 09:11   ECHOCARDIOGRAM COMPLETE  Result Date: 10/25/2019    ECHOCARDIOGRAM REPORT   Patient Name:   Madison Burns Date of Exam: 10/24/2019 Medical Rec #:  [id number]       Height:       62.0 in Accession #:    [id number]  Weight:       204.0 lb Date of Birth:  11-03-1950       BSA:          1.928 m Patient Age:    69 years        BP:           132/84 mmHg Patient Gender: F               HR:           128 bpm. Exam Location:  [facility] Procedure: 2D Echo, Color Doppler and Cardiac Doppler Indications:     I48.91 Atrial Fibrillation  History:         Patient has no prior history of Echocardiogram examinations.                  Risk Factors:Sleep Apnea, Hypertension and HCL.  Sonographer:     Charmayne Sheer RDCS (AE) Referring Phys:  [id number] Collier Bullock Diagnosing Phys: Serafina Royals MD  Sonographer Comments: Suboptimal parasternal window and no subcostal window. Image acquisition challenging due to patient  body habitus, Image acquisition challenging due to respiratory motion and tachycardia. IMPRESSIONS  1. Left ventricular ejection fraction, by estimation, is 70 to 75%. The left ventricle has hyperdynamic function. The left ventricle has no regional wall motion abnormalities. Left ventricular diastolic parameters were normal.  2. Right ventricular systolic function is normal. The right ventricular size is normal. There is normal pulmonary artery systolic pressure.  3. The mitral valve is normal in structure. Trivial mitral valve regurgitation.  4. The aortic valve is normal in structure. Aortic valve regurgitation is not visualized. FINDINGS  Left Ventricle: Left ventricular ejection fraction, by estimation, is 70 to 75%. The left ventricle has hyperdynamic function. The left ventricle has no regional wall motion abnormalities. The left ventricular internal cavity size was small. There is no  left ventricular hypertrophy. Left ventricular diastolic parameters were normal. Right Ventricle: The right ventricular size is normal. No increase in right ventricular wall thickness. Right ventricular systolic function is normal. There is normal pulmonary artery systolic pressure. The tricuspid regurgitant velocity is 2.27 m/s, and  with an assumed right atrial pressure of 10 mmHg, the estimated right ventricular systolic pressure is 44.9 mmHg. Left Atrium: Left atrial size was normal in size. Right Atrium: Right atrial size was normal in size. Pericardium: There is no evidence of pericardial effusion. Mitral Valve: The mitral valve is normal in structure. Trivial mitral valve regurgitation. MV peak gradient, 3.6 mmHg. The mean mitral valve gradient is 2.0 mmHg. Tricuspid Valve: The tricuspid valve is normal in structure. Tricuspid valve regurgitation is trivial. Aortic Valve: The aortic valve is normal in structure. Aortic valve regurgitation is not visualized. Aortic valve mean gradient measures 3.0 mmHg. Aortic valve peak  gradient measures 5.5 mmHg. Aortic valve area, by VTI measures 3.31 cm. Pulmonic Valve: The pulmonic valve was normal in structure. Pulmonic valve regurgitation is not visualized. Aorta: The aortic root and ascending aorta are structurally normal, with no evidence of dilitation. IAS/Shunts: No atrial level shunt detected by color flow Doppler.  LEFT VENTRICLE PLAX 2D LVIDd:         3.83 cm  Diastology LVIDs:         2.31 cm  LV e' lateral:   7.94 cm/s LV PW:         1.06 cm  LV E/e' lateral: 7.1 LV IVS:        0.86 cm  LV e' medial:  7.18 cm/s LVOT diam:     2.20 cm  LV E/e' medial:  7.8 LV SV:         48 LV SV Index:   25 LVOT Area:     3.80 cm  LEFT ATRIUM             Index LA diam:        2.90 cm 1.50 cm/m LA Vol (A2C):   37.5 ml 19.45 ml/m LA Vol (A4C):   39.8 ml 20.64 ml/m LA Biplane Vol: 42.5 ml 22.05 ml/m  AORTIC VALVE                   PULMONIC VALVE AV Area (Vmax):    2.62 cm    PV Vmax:       1.15 m/s AV Area (Vmean):   3.08 cm    PV Vmean:      65.500 cm/s AV Area (VTI):     3.31 cm    PV VTI:        0.155 m AV Vmax:           117.00 cm/s PV Peak grad:  5.3 mmHg AV Vmean:          76.200 cm/s PV Mean grad:  2.0 mmHg AV VTI:            0.146 m AV Peak Grad:      5.5 mmHg AV Mean Grad:      3.0 mmHg LVOT Vmax:         80.50 cm/s LVOT Vmean:        61.800 cm/s LVOT VTI:          0.127 m LVOT/AV VTI ratio: 0.87  AORTA Ao Root diam: 3.60 cm MITRAL VALVE               TRICUSPID VALVE MV Area (PHT): 5.66 cm    TR Peak grad:   20.6 mmHg MV Peak grad:  3.6 mmHg    TR Vmax:        227.00 cm/s MV Mean grad:  2.0 mmHg MV Vmax:       0.95 m/s    SHUNTS MV Vmean:      63.9 cm/s   Systemic VTI:  0.13 m MV Decel Time: 134 msec    Systemic Diam: 2.20 cm MV E velocity: 56.30 cm/s MV A velocity: 70.30 cm/s MV E/A ratio:  0.80 Serafina Royals MD Electronically signed by Serafina Royals MD Signature Date/Time: 10/25/2019/1:12:06 PM    Final      Echo Normal left ventricular function with no wall motion  abnormalities  TELEMETRY: normal sinus rhythm, 80 bpm  ASSESSMENT AND PLAN:  Principal Problem:   Sepsis ([place]) Active Problems:   Hypertension   Obesity (BMI 30-39.9)   MG with exacerbation (myasthenia gravis) ([facility])   OSA (obstructive sleep apnea)   Left renal mass   Hypothyroidism   Atrial fibrillation with RVR ([place])   Community acquired pneumonia   Pressure injury of skin   1.Paroxysmal atrial fibrillationwith rapid ventricular rate,brief episodein the setting  of left lower lobe pneumonia and sepsis, currently in sinus rhythm onCardizemCD 180 mg daily. 2D echocardiogram reveals normal left ventricular function with no wall motion abnormalities.  2.Lower lobe pneumonia/sepsis 3.Chest pain, atypical, sharp and pleuritic in nature, likely due to underlying pneumonia, not cardiac in nature. ECG nondiagnostic. Troponin flat without significant delta (24, 69, 54, 60, 62)  Recommendations: 1.  Agree with current therapy 2.Defer  chronic anticoagulation at this time,since atrial fibrillation occurred during a reversible condition (left lower lobe pneumonia/sepsis),has remained in sinus rhythm on Cardizem 3.ContinueCardizem CD 180 mg daily 4. No further cardiac diagnostics recommended at this time. 5. Recommend follow up with DR. Fath in 1 week from discharge  Sign off for now; please call/haiku with any questions.   Clabe Seal, PA-C 10/26/2019 8:34 AM

## 2019-10-26 NOTE — Progress Notes (Signed)
69 year old female with MG diagnosed in 2011, developmental delay and seizures (not currently on an anticonvulsant), recent chemotherapy and external beam radiation therapy for invasive thymoma ([place] IVa thymoma) at which time her chronic immunosuppressive therapy for myasthenia gravis was discontinued, who presented to [facility] with leukocytosis, right sided chest pain, sensation of feeling short of breath, with left base opacity and hypoinflation found on chest imaging. She was started on ABX. Neurology was consulted for possible MG exacerbation. She was started on IVIG and is now on day 5/5.   Of note, her MG gravis symptoms had improved markedly after thymoma ablation (as of her RadOnc follow up note from 9/2). Unclear if this was due to thymoma ablation itself or possibly the immunosuppressive effect of her chemotherapy (carboplatin).   A/R: -Q6h NIF and FVC with respiratory therapy to document in the chart.  -Neurology will repeat exam tomorrow to assess efficacy of IVIG.   Electronically signed: Dr. Kerney Elbe

## 2019-10-26 NOTE — Evaluation (Signed)
Physical Therapy Evaluation Patient Details Name: Madison Burns MRN: [id number] DOB: 13-Sep-1950 Today's Date: 10/26/2019   History of Present Illness  Pt is a 69 y.o. female presenting to hospital 9/4 with R sided chest pain and SOB.  Pt admitted with sepsis d/t community acquired PNA L lower lobe, myasthenia gravis with possible flare, and new onset a-fib with RVR.  Rapid response 9/6 d/t chest pain.  PMH includes htn, OSA, IBS, myasthenia gravis, thymoma, s/p radiation therapy 4 months ago; renal CA, h/o seizure disorder.  Clinical Impression  Prior to hospital admission, pt reports being ambulatory (no AD) and lives with her sister (who is pt's caregiver).  Pt sitting on BSC upon PT arrival with nurse present and pt on room air.  Pt's O2 sats 94% on room air with no SOB noted and pt appearing comfortable.  Once pt finished toileting (after nurse left), pt stood briefly (required assist for toileting clean-up) before transferring back to bed.  After pt sat on edge of bed, pt noted with mild SOB so pt assisted back to bed.  Pt then noted with increased work of breathing and increased SOB so nursing notified immediately who came to check on pt (O2 sats progressively decreasing to 69%) and then nurse called respiratory who came to place pt back on BiPap (pt's O2 sats then improved to 93-94% on BiPap).  Pt would benefit from skilled PT to address noted impairments and functional limitations per pt tolerance (see below for any additional details).  Upon hospital discharge, pt would benefit from HHPT and 24/7 assist/supervision.    Follow Up Recommendations Home health PT;Supervision/Assistance - 24 hour    Equipment Recommendations   (DME TBD)    Recommendations for Other Services       Precautions / Restrictions Precautions Precautions: Fall Precaution Comments: Keep O2 >92% Restrictions Weight Bearing Restrictions: No      Mobility  Bed Mobility Overal bed mobility: Needs Assistance Bed  Mobility: Sit to Supine       Sit to supine: Supervision;HOB elevated   General bed mobility comments: mild increased effort to get into bed on own but no assist required  Transfers Overall transfer level: Needs assistance Equipment used: None Transfers: Sit to/from Stand;Stand Pivot Transfers Sit to Stand: Min guard (sit to stand from [facility] and stand to sit onto bed; steady and appearing fairly strong with transfers) Stand pivot transfers: Min guard (stand step turn BSC to recliner)       General transfer comment: CGA for safety but overall steady and safe  Ambulation/Gait             General Gait Details: Deferred d/t respiratory concerns  Stairs            Wheelchair Mobility    Modified Rankin (Stroke Patients Only)       Balance Overall balance assessment: Mild deficits observed, not formally tested                                           Pertinent Vitals/Pain  No pain-like behavior or reports of pain during session.    Home Living Family/patient expects to be discharged to:: Private residence Living Arrangements: Other relatives (Sister [name]) Available Help at Discharge: Family Type of Home: House         Home Equipment: Shower seat Additional Comments: no family present to verify info provided  Prior Function Level of Independence: Needs assistance   Gait / Transfers Assistance Needed: Pt reports no AD use with ambulation at home  ADL's / Homemaking Assistance Needed: Per OT eval "pt reports that sister [name] assists with seated shower, cleaning, meals, medications, and transportation. Pt reports she is able to performing toileting and dressing without assist"        Hand Dominance   Dominant Hand: Right    Extremity/Trunk Assessment   Upper Extremity Assessment Upper Extremity Assessment: Defer to OT evaluation    Lower Extremity Assessment Lower Extremity Assessment: Generalized weakness    Cervical /  Trunk Assessment Cervical / Trunk Assessment: Normal  Communication      Cognition Arousal/Alertness: Awake/alert Behavior During Therapy: Flat affect Overall Cognitive Status: No family/caregiver present to determine baseline cognitive functioning                                 General Comments: Alert and oriented to self and hospital.  Per chart and nursing, pt with developmental delay.  Increased time to process information.  Follows simple commands fairly consistently.      General Comments   Nursing cleared pt for participation in physical therapy (respiratory took pt off BiPap earlier to eat breakfast).  Per text discussion with MD Arbutus Ped, okay to do physical therapy off bi-pap as long as pt tolerates it (if any distress, stop and put bi-pap back on).  Pt agreeable to PT session.    Exercises     Assessment/Plan    PT Assessment Patient needs continued PT services  PT Problem List Decreased strength;Decreased balance;Decreased activity tolerance;Decreased mobility;Cardiopulmonary status limiting activity;Decreased knowledge of precautions       PT Treatment Interventions DME instruction;Gait training;Functional mobility training;Therapeutic activities;Therapeutic exercise;Balance training;Patient/family education    PT Goals (Current goals can be found in the Care Plan section)  Acute Rehab PT Goals Patient Stated Goal: get better and go home with sister PT Goal Formulation: With patient Time For Goal Achievement: 11/09/19 Potential to Achieve Goals: Good    Frequency Min 2X/week   Barriers to discharge        Co-evaluation               AM-PAC PT "6 Clicks" Mobility  Outcome Measure Help needed turning from your back to your side while in a flat bed without using bedrails?: None Help needed moving from lying on your back to sitting on the side of a flat bed without using bedrails?: A Little Help needed moving to and from a bed to a chair  (including a wheelchair)?: A Little Help needed standing up from a chair using your arms (e.g., wheelchair or bedside chair)?: A Little Help needed to walk in hospital room?: A Little Help needed climbing 3-5 steps with a railing? : A Lot 6 Click Score: 18    End of Session Equipment Utilized During Treatment: Gait belt Activity Tolerance: Treatment limited secondary to medical complications (Comment) (increased SOB and O2 desaturation after returning to bed) Patient left: in bed;with call bell/phone within reach;with bed alarm set;with nursing/sitter in room Nurse Communication: Mobility status;Precautions;Other (comment) (pt's vitals and breathing concerns) PT Visit Diagnosis: Other abnormalities of gait and mobility (R26.89);Muscle weakness (generalized) (M62.81);Difficulty in walking, not elsewhere classified (R26.2)    Time: 8469-6295 PT Time Calculation (min) (ACUTE ONLY): 20 min   Charges:   PT Evaluation $PT Eval Low Complexity: 1 Low  Leitha Bleak, PT 10/26/19, 3:09 PM

## 2019-10-26 NOTE — Progress Notes (Signed)
Patient performed NIF to best of her ability, NIF -14. Unable to perform VC at this time. Patient placed back on Bipap, tolerating well.

## 2019-10-26 NOTE — Progress Notes (Signed)
Patient performed the NIF, to the best of her ability, her score was -18

## 2019-10-26 NOTE — Consult Note (Addendum)
Hematology/Oncology Consult note [facility] [phone] [phone]  Patient Care Team: Einar Pheasant, MD as PCP - General (Internal Medicine) Telford Nab, RN as Oncology Nurse Navigator   Name of the patient: Madison Burns  [id number]  1950-11-08   Date of visit: 10/26/19 REASON FOR COSULTATION:  Thymoma History of presenting illness-  69 y.o. female with PMH listed at below, including myasthenia gravis, thymoma status post chemotherapy, OSA, depression, and radiation, RCC status post ablation, who presents to ER for evaluation of shortness of breath associated with chest pain and fever.  Patient has development delay and cognitive impairment.  Her chest x-ray showed patchy airspace opacity in the left lung base. CT angiogram of the chest showed no definite pulmonary embolism, moderate atelectasis in the left base.  Similar opacity in the paramediastinal right upper lobe is likely radiation related.  Regressed thymic mass.  No mediastinal adenopathy or pleural nodules.  Patient was noted to have respiratory distress and use accessory muscles for breathing in the emergency room.  Also mild uncompensated respiratory acidosis. NIF in ER was -20.   Patient was admitted for treatment of pneumonia by IV antibiotics, and was also evaluated by neurologist due to the concern of myasthenia gravis flare. Patient was previously on methotrexate.  Her thymoma was treated with concurrent chemotherapy and radiation about 4 months ago.  Methotrexate was stopped due to thymoma treatments and decision was made to stay off methotrexate after thymoma was treated.  She was only on pyridostigmine prior to current presentation. Patient was recommended to start IVIG 400 mg/kg for 5 days, continue pyridostigmine She also had new onset of atrial fibrillation with RVR and was evaluated by cardiology and was recommended no anticoagulation at this point. Patient has finished  5-day course of IVIG.  Heme-onc was consulted for thymoma.  Patient was seen and examined at the bedside.  She is on BiPAP.  She reports " feeling better".  She is not able to provide any detailed history. brother is at the bedside.   Review of Systems  Unable to perform ROS: Other (Development delay and cognitive impairment)    No Known Allergies  Patient Active Problem List   Diagnosis Date Noted  . Pressure injury of skin 10/24/2019  . Atrial fibrillation with RVR ([place]) 10/23/2019  . Community acquired pneumonia 10/23/2019  . Acute respiratory failure with hypoxia and hypercapnia (HCC)   . Sepsis ([place]) 10/22/2019  . Hypothyroidism   . Left renal mass 08/17/2019  . Left shoulder pain 07/18/2019  . Mediastinal mass 04/11/2019  . Renal cell cancer, left ([place]) 03/09/2019  . Thymoma 03/09/2019  . Chest pain 12/05/2018  . Fullness of breast 11/30/2018  . History of seizure disorder 01/05/2018  . Gastroesophagitis 07/02/2017  . Cough 04/25/2017  . Epilepsy, generalized, convulsive ([place]) 02/23/2017  . OSA (obstructive sleep apnea) 02/23/2017  . Mood disorder ([place]) 10/01/2016  . Pain in right toe(s) 06/17/2015  . MG (myasthenia gravis) ([place]) 06/06/2015  . OSA on CPAP 06/06/2015  . Dysuria 02/12/2015  . Goals of care, counseling/discussion 06/12/2014  . Obstructive sleep apnea syndrome 05/03/2014  . MG with exacerbation (myasthenia gravis) ([place]) 04/10/2014  . Hypersomnia with sleep apnea 04/10/2014  . Obesity (BMI 30-39.9) 01/29/2014  . Hyperglycemia 01/29/2014  . Abdominal pain 01/29/2014  . Melanoma ([place]) 01/23/2013  . Hypercholesterolemia 12/21/2011  . Hypertension 12/21/2011  . Myasthenia gravis ([place]) 12/21/2011  . Seizure disorder ([place]) 12/21/2011  . Irritable bowel 12/21/2011  Past Medical History:  Diagnosis Date  . Cancer of kidney ([place]) 2021  . Complication of anesthesia    Myasthenia gravis  . GERD (gastroesophageal reflux disease)   . History of  seizure disorder   . Hypercholesterolemia   . Hypertension    no longer has it. wt loss  . Hypothyroidism   . Irritable bowel syndrome   . Myasthenia gravis ([place]) 2011  . OSA on CPAP   . Skin cancer 2017   Melanoma on neck     Past Surgical History:  Procedure Laterality Date  . ABDOMINAL HYSTERECTOMY  1990  . APPENDECTOMY  1999  . CHOLECYSTECTOMY  2013  . COLONOSCOPY WITH PROPOFOL N/A 06/30/2017   Procedure: COLONOSCOPY WITH PROPOFOL;  Surgeon: Toledo, Benay Pike, MD;  Location: [facility] ENDOSCOPY;  Service: Gastroenterology;  Laterality: N/A;  . CYSTOSCOPY W/ URETERAL STENT PLACEMENT Left 08/17/2019   Procedure: CYSTOSCOPY WITH RETROGRADE PYELOGRAM/URETERAL STENT PLACEMENT;  Surgeon: Alexis Frock, MD;  Location: [facility] ORS;  Service: Urology;  Laterality: Left;  30 MINS  . ESOPHAGOGASTRODUODENOSCOPY (EGD) WITH PROPOFOL N/A 06/30/2017   Procedure: ESOPHAGOGASTRODUODENOSCOPY (EGD) WITH PROPOFOL;  Surgeon: Toledo, Benay Pike, MD;  Location: [facility] ENDOSCOPY;  Service: Gastroenterology;  Laterality: N/A;  . IR RADIOLOGIST EVAL & MGMT  06/28/2019  . IR RADIOLOGIST EVAL & MGMT  09/14/2019  . RADIOFREQUENCY ABLATION Left 08/17/2019   Procedure: LEFT RENAL CRYO ABLATION;  Surgeon: Jacqulynn Cadet, MD;  Location: [facility];  Service: Anesthesiology;  Laterality: Left;  . TONSILLECTOMY  1959    Social History   Socioeconomic History  . Marital status: Single    Spouse name: Not on file  . Number of children: 0  . Years of education: Special Ed  . Highest education level: Not on file  Occupational History  . Occupation: Unemployed  Tobacco Use  . Smoking status: Never Smoker  . Smokeless tobacco: Never Used  Vaping Use  . Vaping Use: Never used  Substance and Sexual Activity  . Alcohol use: No    Alcohol/week: 0.0 standard drinks  . Drug use: No  . Sexual activity: Not Currently  Other Topics Concern  . Not on file  Social History Narrative   06/23/17 lives with sister, [name]   Regular  exercise-no   Caffeine Use-yes   Social Determinants of Health   Financial Resource Strain:   . Difficulty of Paying Living Expenses: Not on file  Food Insecurity:   . Worried About Charity fundraiser in the Last Year: Not on file  . Ran Out of Food in the Last Year: Not on file  Transportation Needs:   . Lack of Transportation (Medical): Not on file  . Lack of Transportation (Non-Medical): Not on file  Physical Activity:   . Days of Exercise per Week: Not on file  . Minutes of Exercise per Session: Not on file  Stress:   . Feeling of Stress : Not on file  Social Connections:   . Frequency of Communication with Friends and Family: Not on file  . Frequency of Social Gatherings with Friends and Family: Not on file  . Attends Religious Services: Not on file  . Active Member of Clubs or Organizations: Not on file  . Attends Archivist Meetings: Not on file  . Marital Status: Not on file  Intimate Partner Violence:   . Fear of Current or Ex-Partner: Not on file  . Emotionally Abused: Not on file  . Physically Abused: Not on file  . Sexually  Abused: Not on file     Family History  Problem Relation Age of Onset  . Colon cancer Maternal Grandfather        stomach/colon  . Heart disease Father        myocardial infarction  . Hyperlipidemia Father   . Hyperlipidemia Sister   . Diabetes Sister   . Bone cancer Other        cousin  . Alzheimer's disease Mother        maternal aunts  . Skin cancer Mother   . Myasthenia gravis Brother        ocular  . Skin cancer Brother   . Stroke Paternal Grandfather   . Breast cancer Neg Hx      Current Facility-Administered Medications:  .  acetaminophen (TYLENOL) tablet 650 mg, 650 mg, Oral, Q6H PRN, 650 mg at 10/25/19 2104 **OR** acetaminophen (TYLENOL) suppository 650 mg, 650 mg, Rectal, Q6H PRN, Agbata, Tochukwu, MD .  ascorbic acid (VITAMIN C) tablet 1,000 mg, 1,000 mg, Oral, QHS, Agbata, Tochukwu, MD, 1,000 mg at 10/25/19  2105 .  B-complex with vitamin C tablet 1 tablet, 1 tablet, Oral, QPM, Agbata, Tochukwu, MD, 1 tablet at 10/25/19 1815 .  calcium citrate-vitamin D 500-500 MG-UNIT per chewable tablet 1 tablet, 1 tablet, Oral, Daily, Agbata, Tochukwu, MD, 1 tablet at 10/26/19 1119 .  ceFEPIme (MAXIPIME) 2 g in sodium chloride 0.9 % 100 mL IVPB, 2 g, Intravenous, Q8H, Hallaji, Sheema M, RPH, Last Rate: 200 mL/hr at 10/26/19 0538, 2 g at 10/26/19 0538 .  diltiazem (CARDIZEM CD) 24 hr capsule 180 mg, 180 mg, Oral, Daily, Paraschos, Alexander, MD, 180 mg at 10/26/19 1119 .  doxycycline (VIBRA-TABS) tablet 100 mg, 100 mg, Oral, Q12H, Danford, Suann Larry, MD, 100 mg at 10/26/19 1118 .  enoxaparin (LOVENOX) injection 40 mg, 40 mg, Subcutaneous, Q24H, Oswald Hillock, RPH .  EPINEPHrine (EPI-PEN) injection 0.3 mg, 0.3 mg, Intramuscular, Once PRN, Danford, Suann Larry, MD .  Immune Globulin 10% (PRIVIGEN) IV infusion 35 g, 400 mg/kg, Intravenous, Q24 Hr x 5, Aroor, Lanice Schwab, MD, Last Rate: 222 mL/hr at 10/25/19 2318, Rate Change at 10/25/19 2318 .  levothyroxine (SYNTHROID) tablet 75 mcg, 75 mcg, Oral, Q0600, Agbata, Tochukwu, MD, 75 mcg at 10/26/19 0539 .  ondansetron (ZOFRAN) tablet 4 mg, 4 mg, Oral, Q6H PRN **OR** ondansetron (ZOFRAN) injection 4 mg, 4 mg, Intravenous, Q6H PRN, Agbata, Tochukwu, MD .  pantoprazole (PROTONIX) EC tablet 40 mg, 40 mg, Oral, Daily, Agbata, Tochukwu, MD, 40 mg at 10/26/19 1118 .  potassium chloride SA (KLOR-CON) CR tablet 40 mEq, 40 mEq, Oral, Once, Nicole Kindred A, DO .  pyridostigmine (MESTINON) tablet 60 mg, 60 mg, Oral, Q8H, Danford, Suann Larry, MD, 60 mg at 10/26/19 0538 .  sodium chloride flush (NS) 0.9 % injection 10-40 mL, 10-40 mL, Intracatheter, Q12H, Agbata, Tochukwu, MD, 10 mL at 10/26/19 1121 .  sodium chloride flush (NS) 0.9 % injection 10-40 mL, 10-40 mL, Intracatheter, PRN, Agbata, Tochukwu, MD, 10 mL at 10/26/19 1120 .  sucralfate (CARAFATE) tablet 1 g, 1 g, Oral,  BID, Agbata, Tochukwu, MD, 1 g at 10/26/19 1127 .  vitamin E capsule 400 Units, 400 Units, Oral, Q24H, Agbata, Tochukwu, MD, 400 Units at 10/25/19 1347   Physical exam:  Vitals:   10/26/19 0800 10/26/19 1005 10/26/19 1121 10/26/19 1143  BP: 126/83  (!) 154/92   Pulse: 87  91   Resp: (!) 22  18   Temp: 97.8 F (36.6 C)  97.8 F (36.6 C)   TempSrc: Oral  Oral   SpO2: 96% 98% 99% 98%  Weight:      Height:       Physical Exam Constitutional:      General: She is not in acute distress.    Appearance: She is obese.  HENT:     Head: Normocephalic.  Cardiovascular:     Rate and Rhythm: Normal rate.  Pulmonary:     Effort: Pulmonary effort is normal.     Comments: Breathe comfortably on BiPAP. Abdominal:     General: Abdomen is flat.  Skin:    General: Skin is warm.  Neurological:     Mental Status: She is alert. Mental status is at baseline.  Psychiatric:        Mood and Affect: Mood normal.         CMP Latest Ref Rng & Units 10/26/2019  Glucose 70 - 99 mg/dL 123(H)  BUN 8 - 23 mg/dL 14  Creatinine 0.44 - 1.00 mg/dL 0.61  Sodium 135 - 145 mmol/L 135  Potassium 3.5 - 5.1 mmol/L 3.4(L)  Chloride 98 - 111 mmol/L 100  CO2 22 - 32 mmol/L 31  Calcium 8.9 - 10.3 mg/dL 8.4(L)  Total Protein 6.5 - 8.1 g/dL -  Total Bilirubin 0.3 - 1.2 mg/dL -  Alkaline Phos 38 - 126 U/L -  AST 15 - 41 U/L -  ALT 0 - 44 U/L -   CBC Latest Ref Rng & Units 10/26/2019  WBC 4.0 - 10.5 K/uL 6.3  Hemoglobin 12.0 - 15.0 g/dL 12.2  Hematocrit 36 - 46 % 37.3  Platelets 150 - 400 K/uL 143(L)    RADIOGRAPHIC STUDIES: I have personally reviewed the radiological images as listed and agreed with the findings in the report. DG Chest 2 View  Result Date: 10/21/2019 CLINICAL DATA:  Sepsis EXAM: CHEST - 2 VIEW COMPARISON:  January 01, 2019 FINDINGS: The heart size and mediastinal contours are within normal limits. Overall shallow degree of aeration with patchy airspace opacity at the left lung base. The  right lung is clear. No pleural effusion no acute osseous abnormality. IMPRESSION: Shallow degree of aeration with patchy airspace opacity at the left lung base which could be due to atelectasis and/or infectious etiology. Electronically Signed   By: Prudencio Pair M.D.   On: 10/21/2019 21:33   CT Angio Chest PE W/Cm &/Or Wo Cm  Result Date: 10/22/2019 CLINICAL DATA:  Right-sided chest pain.  Shortness of breath. EXAM: CT ANGIOGRAPHY CHEST WITH CONTRAST TECHNIQUE: Multidetector CT imaging of the chest was performed using the standard protocol during bolus administration of intravenous contrast. Multiplanar CT image reconstructions and MIPs were obtained to evaluate the vascular anatomy. CONTRAST:  159mL OMNIPAQUE IOHEXOL 350 MG/ML SOLN COMPARISON:  Chest CT 03/07/2019 FINDINGS: Cardiovascular: Cardiomegaly without pericardial effusion. Limited atheromatous changes for age. Moderate to intermittently significant CTA degradation due to motion, bolus dispersion, and streak. No definite pulmonary emboli. Mediastinum/Nodes: Interval contraction of the anterior mediastinal mass with coarse calcifications. No progressive finding or adenopathy. Lungs/Pleura: Volume loss and bandlike opacity in the paramedian right upper lobe, suspect radiation fibrosis. Atelectasis at the left base. No edema, effusion, or pneumothorax Upper Abdomen: Large spleen, also seen on prior with 13 cm anterior to posterior span and 6 cm thickness. Musculoskeletal: No acute or aggressive finding Review of the MIP images confirms the above findings. IMPRESSION: 1. Multifactorial degradation of the CTA with no definite pulmonary embolism. 2. Moderate atelectasis at the  left base. A similar opacity in the paramediastinal right upper lobe is likely radiation related. 3. Regressed thymic mass. No mediastinal adenopathy or pleural nodules. Electronically Signed   By: Monte Fantasia M.D.   On: 10/22/2019 08:24   US Abdomen Complete  Result Date:  10/23/2019 CLINICAL DATA:  Abdominal pain. The patient had ablation of a lower pole left kidney mass earlier this year. EXAM: ABDOMEN ULTRASOUND COMPLETE COMPARISON:  Abdomen CT dated 06/15/2019. Abdomen ultrasound dated 05/01/2017. FINDINGS: Gallbladder: Surgically absent. Common bile duct: Diameter: 5.0 mm proximally Liver: No focal lesion identified. Within normal limits in parenchymal echogenicity. Portal vein is patent on color Doppler imaging with normal direction of blood flow towards the liver. IVC: No abnormality visualized. Pancreas: Visualized portion unremarkable. Spleen: Size and appearance within normal limits. Right Kidney: Length: 8.0 cm. Diffuse renal cortical thickening with prominent renal sinus fat. Normal echotexture. No hydronephrosis. Left Kidney: Length: 10.9 cm. Normal echotexture. Stable mildly prominent, oval, extrarenal pelvis adjacent to the lower pole. The previously demonstrated lower pole solid mass is not well delineated. Abdominal aorta: No aneurysm visualized. Other findings: None. IMPRESSION: 1. No acute abnormality. 2. The previously demonstrated lower pole left renal mass is not well delineated with ultrasound today. This could be better visualized with pre and postcontrast MRI of the kidneys on an elective outpatient basis if clinically indicated. Electronically Signed   By: Claudie Revering M.D.   On: 10/23/2019 10:56   DG Chest Port 1 View  Result Date: 10/24/2019 CLINICAL DATA:  Acute respiratory distress. EXAM: PORTABLE CHEST 1 VIEW COMPARISON:  October 21, 2019. FINDINGS: Stable cardiomediastinal silhouette. Mild central pulmonary vascular congestion is noted. No pneumothorax is noted. Hypoinflation of the lungs is noted. Mild left basilar atelectasis or infiltrate is noted with associated pleural effusion. Bony thorax is unremarkable. IMPRESSION: Hypoinflation of the lungs. Mild left basilar atelectasis or infiltrate is noted with associated pleural effusion. Mild central  pulmonary vascular congestion is noted. Electronically Signed   By: Marijo Conception M.D.   On: 10/24/2019 09:11   ECHOCARDIOGRAM COMPLETE  Result Date: 10/25/2019    ECHOCARDIOGRAM REPORT   Patient Name:   NAFEESA DILS Pare Date of Exam: 10/24/2019 Medical Rec #:  [id number]       Height:       62.0 in Accession #:    [id number]      Weight:       204.0 lb Date of Birth:  03-06-1950       BSA:          1.928 m Patient Age:    36 years        BP:           132/84 mmHg Patient Gender: F               HR:           128 bpm. Exam Location:  [facility] Procedure: 2D Echo, Color Doppler and Cardiac Doppler Indications:     I48.91 Atrial Fibrillation  History:         Patient has no prior history of Echocardiogram examinations.                  Risk Factors:Sleep Apnea, Hypertension and HCL.  Sonographer:     Charmayne Sheer RDCS (AE) Referring Phys:  [id number] Collier Bullock Diagnosing Phys: Serafina Royals MD  Sonographer Comments: Suboptimal parasternal window and no subcostal window. Image acquisition challenging due to patient body habitus, Image acquisition challenging  due to respiratory motion and tachycardia. IMPRESSIONS  1. Left ventricular ejection fraction, by estimation, is 70 to 75%. The left ventricle has hyperdynamic function. The left ventricle has no regional wall motion abnormalities. Left ventricular diastolic parameters were normal.  2. Right ventricular systolic function is normal. The right ventricular size is normal. There is normal pulmonary artery systolic pressure.  3. The mitral valve is normal in structure. Trivial mitral valve regurgitation.  4. The aortic valve is normal in structure. Aortic valve regurgitation is not visualized. FINDINGS  Left Ventricle: Left ventricular ejection fraction, by estimation, is 70 to 75%. The left ventricle has hyperdynamic function. The left ventricle has no regional wall motion abnormalities. The left ventricular internal cavity size was small. There is no  left ventricular  hypertrophy. Left ventricular diastolic parameters were normal. Right Ventricle: The right ventricular size is normal. No increase in right ventricular wall thickness. Right ventricular systolic function is normal. There is normal pulmonary artery systolic pressure. The tricuspid regurgitant velocity is 2.27 m/s, and  with an assumed right atrial pressure of 10 mmHg, the estimated right ventricular systolic pressure is 23.5 mmHg. Left Atrium: Left atrial size was normal in size. Right Atrium: Right atrial size was normal in size. Pericardium: There is no evidence of pericardial effusion. Mitral Valve: The mitral valve is normal in structure. Trivial mitral valve regurgitation. MV peak gradient, 3.6 mmHg. The mean mitral valve gradient is 2.0 mmHg. Tricuspid Valve: The tricuspid valve is normal in structure. Tricuspid valve regurgitation is trivial. Aortic Valve: The aortic valve is normal in structure. Aortic valve regurgitation is not visualized. Aortic valve mean gradient measures 3.0 mmHg. Aortic valve peak gradient measures 5.5 mmHg. Aortic valve area, by VTI measures 3.31 cm. Pulmonic Valve: The pulmonic valve was normal in structure. Pulmonic valve regurgitation is not visualized. Aorta: The aortic root and ascending aorta are structurally normal, with no evidence of dilitation. IAS/Shunts: No atrial level shunt detected by color flow Doppler.  LEFT VENTRICLE PLAX 2D LVIDd:         3.83 cm  Diastology LVIDs:         2.31 cm  LV e' lateral:   7.94 cm/s LV PW:         1.06 cm  LV E/e' lateral: 7.1 LV IVS:        0.86 cm  LV e' medial:    7.18 cm/s LVOT diam:     2.20 cm  LV E/e' medial:  7.8 LV SV:         48 LV SV Index:   25 LVOT Area:     3.80 cm  LEFT ATRIUM             Index LA diam:        2.90 cm 1.50 cm/m LA Vol (A2C):   37.5 ml 19.45 ml/m LA Vol (A4C):   39.8 ml 20.64 ml/m LA Biplane Vol: 42.5 ml 22.05 ml/m  AORTIC VALVE                   PULMONIC VALVE AV Area (Vmax):    2.62 cm    PV Vmax:        1.15 m/s AV Area (Vmean):   3.08 cm    PV Vmean:      65.500 cm/s AV Area (VTI):     3.31 cm    PV VTI:        0.155 m AV Vmax:  117.00 cm/s PV Peak grad:  5.3 mmHg AV Vmean:          76.200 cm/s PV Mean grad:  2.0 mmHg AV VTI:            0.146 m AV Peak Grad:      5.5 mmHg AV Mean Grad:      3.0 mmHg LVOT Vmax:         80.50 cm/s LVOT Vmean:        61.800 cm/s LVOT VTI:          0.127 m LVOT/AV VTI ratio: 0.87  AORTA Ao Root diam: 3.60 cm MITRAL VALVE               TRICUSPID VALVE MV Area (PHT): 5.66 cm    TR Peak grad:   20.6 mmHg MV Peak grad:  3.6 mmHg    TR Vmax:        227.00 cm/s MV Mean grad:  2.0 mmHg MV Vmax:       0.95 m/s    SHUNTS MV Vmean:      63.9 cm/s   Systemic VTI:  0.13 m MV Decel Time: 134 msec    Systemic Diam: 2.20 cm MV E velocity: 56.30 cm/s MV A velocity: 70.30 cm/s MV E/A ratio:  0.80 Serafina Royals MD Electronically signed by Serafina Royals MD Signature Date/Time: 10/25/2019/1:12:06 PM    Final     Assessment and plan-  #Community acquired pneumonia with respiratory failure due to neuromuscular weakness. Continue doxycycline and cefepime.  Continue BiPAP.  #Myasthenia gravis with possible flare, She was off methotrexate after her thymoma treatment and only on pyridostigmine prior to current presentation. Seen by neurology and recommended for IVIG for 5 days which she completes today. Neurology will reevaluate patient to see the effectiveness of IVIG. If no significant improvement of her breathing status, she will then need plasma exchange.  Continue pyridostigmine She may need to go back on methotrexate, and oral prednisone.  #Thymoma, images were independently reviewed by me and discussed with patient.  Thymoma has decreased in size and there is no mediastinal lymphadenopathy or pleural nodules.  No additional treatment at this point recommended for thymoma.  Myasthenia gravis exacerbation may be triggered secondary to acute infection.  #   Thank you for  allowing me to participate in the care of this patient.  Plan was discussed with Dr. Dannielle Karvonen, MD, PhD Hematology [place] at [facility] Pager- [phone] 10/26/2019

## 2019-10-26 NOTE — Progress Notes (Signed)
Pharmacy Antibiotic Note  Madison Burns is a 69 y.o. female admitted on 10/22/2019 with new onset A.fib and Sepsis secondary to CAP.  Left base opacity on chest imaging. Patient was started on Ceftriaxone and azithromycin. Patient was initially planned to get Vancomycin but was d/c'd after loading dose. Patient was switched to doxycycline. Pharmacy was consulted for Cefepime dosing.  Day 5 IV antibiotics (day 3 cefepime and day 4 doxy), WBC wnl, no fevers, and Scr remains stable. D/w MD about adding 5 day stop date. MD wants to assess MG vs infection before discontinuing/adding LOT for abx.  Plan: Continue Cefepime 2g IV every 8 hours  Monitor renal function and adjust dose as indicated.   Height: 5\' 2"  (157.5 cm) Weight: 94.2 kg (207 lb 9.6 oz) IBW/kg (Calculated) : 50.1  Temp (24hrs), Avg:98.3 F (36.8 C), Min:97.8 F (36.6 C), Max:98.9 F (37.2 C)  Recent Labs  Lab 10/21/19 2112 10/22/19 0145 10/23/19 0222 10/24/19 0543 10/25/19 0511 10/26/19 0616  WBC 13.8*  --  8.7 7.3 7.8 6.3  CREATININE 0.71  --  0.72 0.81 0.75 0.61  LATICACIDVEN 1.8 0.8  --   --   --   --     Estimated Creatinine Clearance: 71.9 mL/min (by C-G formula based on SCr of 0.61 mg/dL).    No Known Allergies  Antimicrobials this admission: 9/4 ceftriaxone  >> 9/6  9/4 azithromycin x1 9/5 Doxycycline>> 9/6 Cefepime>> 9/6 Vancomycin x1  Microbiology results: 9/3  BCx: neg 9/6  MRSA PCR: neg  Thank you for allowing pharmacy to be a part of this patients care.  Sherilyn Banker, PharmD Pharmacy Resident  10/26/2019 2:37 PM

## 2019-10-27 ENCOUNTER — Inpatient Hospital Stay: Payer: PPO

## 2019-10-27 DIAGNOSIS — G7000 Myasthenia gravis without (acute) exacerbation: Secondary | ICD-10-CM

## 2019-10-27 DIAGNOSIS — J9601 Acute respiratory failure with hypoxia: Secondary | ICD-10-CM

## 2019-10-27 DIAGNOSIS — Z7189 Other specified counseling: Secondary | ICD-10-CM

## 2019-10-27 DIAGNOSIS — Z515 Encounter for palliative care: Secondary | ICD-10-CM

## 2019-10-27 LAB — BASIC METABOLIC PANEL
Anion gap: 6 (ref 5–15)
BUN: 13 mg/dL (ref 8–23)
CO2: 30 mmol/L (ref 22–32)
Calcium: 8.6 mg/dL — ABNORMAL LOW (ref 8.9–10.3)
Chloride: 100 mmol/L (ref 98–111)
Creatinine, Ser: 0.6 mg/dL (ref 0.44–1.00)
GFR calc Af Amer: >60 mL/min (ref >60)
GFR calc non Af Amer: >60 mL/min (ref >60)
Glucose, Bld: 109 mg/dL — ABNORMAL HIGH (ref 70–99)
Potassium: 3.8 mmol/L (ref 3.5–5.1)
Sodium: 136 mmol/L (ref 135–145)

## 2019-10-27 LAB — CULTURE, BLOOD (ROUTINE X 2): Culture: NO GROWTH

## 2019-10-27 LAB — MAGNESIUM: Magnesium: 1.9 mg/dL (ref 1.7–2.4)

## 2019-10-27 MED ORDER — POTASSIUM CHLORIDE CRYS ER 10 MEQ PO TBCR
EXTENDED_RELEASE_TABLET | ORAL | Status: AC
  Filled 2019-10-27: qty 4

## 2019-10-27 MED ORDER — SODIUM CHLORIDE 0.9 % IV SOLN: INTRAVENOUS | Status: DC | PRN

## 2019-10-27 MED ORDER — POTASSIUM CHLORIDE CRYS ER 20 MEQ PO TBCR
40.0000 meq | EXTENDED_RELEASE_TABLET | Freq: Once | ORAL | Status: AC
  Administered 2019-10-27: 40 meq via ORAL
  Filled 2019-10-27: qty 2

## 2019-10-27 MED ORDER — RISPERIDONE 1 MG PO TABS
2.0000 mg | ORAL_TABLET | Freq: Every day | ORAL | Status: DC
  Administered 2019-10-27: 2 mg via ORAL
  Filled 2019-10-27 (×2): qty 2

## 2019-10-27 MED ORDER — DULOXETINE HCL 30 MG PO CPEP
30.0000 mg | ORAL_CAPSULE | Freq: Every day | ORAL | Status: DC
  Administered 2019-10-27 – 2019-10-28 (×2): 30 mg via ORAL
  Filled 2019-10-27 (×2): qty 1

## 2019-10-27 MED ORDER — SODIUM CHLORIDE 0.9 % IV SOLN
1.0000 g | INTRAVENOUS | Status: DC
  Administered 2019-10-27: 1 g via INTRAVENOUS
  Filled 2019-10-27: qty 10
  Filled 2019-10-27: qty 1

## 2019-10-27 MED ORDER — FUROSEMIDE 10 MG/ML IJ SOLN
40.0000 mg | Freq: Once | INTRAMUSCULAR | Status: AC
  Administered 2019-10-27: 40 mg via INTRAVENOUS
  Filled 2019-10-27: qty 4

## 2019-10-27 NOTE — Progress Notes (Signed)
NIF -18 cm.

## 2019-10-27 NOTE — Progress Notes (Signed)
PROGRESS NOTE    Madison Burns   EQA:[id number]  DOB: [birth year]  PCP: Einar Pheasant, MD    DOA: 10/22/2019 LOS: 5   Brief Narrative   Madison Burns is a 69 y.o. F with developmental Burns, MG on pyridostigmine, invasive thymoma s/p radiation on carboplatin this year, renal cancer s/p cryoablation, OSA on CPAP,  hx seizures not currently on AED, and hypothyroidism who presented with chest pain and cough.   In the ER, found to have leukocytosis, left base opacity on chest imaging.  Neurology consulted given concern for MG flare.  Started on antibiotics, IVIG.     Assessment & Plan   Principal Problem:   Sepsis ([place]) Active Problems:   Hypertension   Obesity (BMI 30-39.9)   MG with exacerbation (myasthenia gravis) (HCC)   OSA (obstructive sleep apnea)   Left renal mass   Hypothyroidism   Atrial fibrillation with RVR ([place])   Community acquired pneumonia   Pressure injury of skin   Acute respiratory distress   Sepsis due to community-acquired pneumonia, left lower lobe Sepsis was present on admission as evidenced by tachypnea and tachycardia, leukocytosis and CXR concerning for L base infiltrate (vs atelectasis).  Started on empiric antibiotics and admitted.   She developed worsening respiratory status, felt to be from neuromuscular weakness secondary to MG flare. Neuro consulted, BiPAP started and IVIG started, completed course 9/8.   Failed to obtain sputum culture. Blood cultures negative.  -Continue doxycycline, de-escalate Cefepime to Rocephin -Continue BiPAP -Consult neurology - signed off --Outpatient follow up with Dr. Beacher May -  --Neuro here recommended to discuss resuming MTX or prednisone to prevent MG flare. -Lasix 40 IV x1 given 9/7, again today 9/9 --repeat Lasix if needed    Myasthenia gravis with possible flare Patient had 1 flare of myasthenia in 2011 has been stable on Mestinon since then.   Has been off methotrexate since chemotherapy for  her thymoma (it was stopped for chemo, decided to remain off it), now with likely MG flare.   Currently appears to have reduced NIF (-20 only) and clinically to have reduced chest wall movement, shallow inspirations. Neurology consulted.  Completed 5 days of IVIG.    -Continue pyridostigmine TID -RT to continue serial NIFs and FVCs q6hrs & chart values -Consults: Neurology and Critical Care -Continue NIPPV as needed    New onset atrial fibrillation with RVR -  Paroxysmal by definition. CHA2DS2-VASc 2. Cardiology consulted, recommend no anticoagluation at this time.   Initially on diltiazem drip, converted back to sinus rhythm.   TSH normal. -Continue oral Cardizem -Consult cardiology --defer anticoagulation as A-fib due to underlying acute illness and in sinus with Cardizem   Chest pain - not POA, had episode of chest pain AM of 9/7. Serial troponins normal, ECG without ST changes.  Monitor.  Hypothyroidism - continue levothyroxine.  TSH normal.  Invasive thymoma -   Hx of Renal cancer - no acute issues  Obstructive Sleep apnea - BiPAP ordered   Depression and cognitive Burns -  --Resume duloxetine, Risperdal  GERD/PUD - continue PPI, sucralfate  Hyponatremia - Resolved.  Was mild, and patient asymptomatic.  Follow BMP.   Overactive bladder - continue fesoterodine   Obesity: Body mass index is 37.5 kg/m.  Complicates overall care and prognosis.  DVT prophylaxis: enoxaparin (LOVENOX) injection 40 mg Start: 10/26/19 2200   Diet:  Diet Orders (From admission, onward)    Start     Ordered   10/25/19 1329  Diet clear  liquid Room service appropriate? Yes; Fluid consistency: Thin  Diet effective now       Question Answer Comment  Room service appropriate? Yes   Fluid consistency: Thin      10/25/19 1328            Code Status: Partial Code    Subjective 10/27/19    Patient seen with one of her brothers at bedside today.  She is off bipap, on  room air, with good oxygen saturations at rest.  Says she feels well, denies SOB, cough, CP, F/C or other complaints.   Disposition Plan & Communication   Status is: Inpatient  Remains inpatient appropriate due to severity of illness with ongoing respiratory failure, on IV medications.  Dispo: The patient is from: home              Anticipated d/c is to: home              Anticipated d/c date is: 3 days              Patient currently is not medically stable for d/c.   Family Communication: brother was at bedside during encounter today 9/9.    Consults, Procedures, Significant Events   Consultants:   Neurology  Palliative care  Cardiology    Antimicrobials:   Rocephin, Doxycycline    Objective   Vitals:   10/27/19 0100 10/27/19 0300 10/27/19 0530 10/27/19 0743  BP: 129/82 137/82 (!) 139/92 (!) 152/96  Pulse: 76 69 81 80  Resp: 13 19 (!) 22 (!) 22  Temp:   98.2 F (36.8 C) 98.4 F (36.9 C)  TempSrc:   Axillary Axillary  SpO2: 94% 97% 97% 97%  Weight:   93 kg   Height:        Intake/Output Summary (Last 24 hours) at 10/27/2019 0933 Last data filed at 10/27/2019 0746 [place] per 24 hour  Intake 1280 ml  Output 2575 ml  Net -1295 ml   Filed Weights   10/25/19 0500 10/26/19 0400 10/27/19 0530  Weight: 111.9 kg 94.2 kg 93 kg    Physical Exam:  General exam: awake, alert, no acute distress, obese Respiratory system: shallow inspirations improved today, no wheezes or rhonchi, normal respiratory effort at rest. Cardiovascular system: normal S1/S2, RRR, no pedal edema. Gastrointestinal system: soft, NT, ND, +bowel sounds. Central nervous system: no [place] focal neurologic deficits, normal speech Extremities: moves all, no cyanosis, normal tone Psychiatry: normal mood, congruent affect  Labs   Data Reviewed: I have personally reviewed following labs and imaging studies  CBC: Recent Labs  Lab 10/21/19 2112 10/23/19 0222 10/24/19 0543 10/25/19 0511  10/26/19 0616  WBC 13.8* 8.7 7.3 7.8 6.3  NEUTROABS 11.9*  --   --   --   --   HGB 14.6 12.0 12.0 11.7* 12.2  HCT 44.3 36.8 37.2 35.9* 37.3  MCV 91.9 93.6 93.5 92.1 93.3  PLT 150 129* 154 150 106*   Basic Metabolic Panel: Recent Labs  Lab 10/21/19 2112 10/22/19 0757 10/23/19 0222 10/24/19 0543 10/25/19 0511 10/26/19 0616 10/27/19 0447  NA   < >  --  130* 136 133* 135 136  K   < >  --  3.9 3.8 3.9 3.4* 3.8  CL   < >  --  98 102 101 100 100  CO2   < >  --  26 27 26 31 30   GLUCOSE   < >  --  126* 126* 122* 123* 109*  BUN   < >  --  15 15 20 14 13   CREATININE   < >  --  0.72 0.81 0.75 0.61 0.60  CALCIUM   < >  --  8.3* 8.7* 8.7* 8.4* 8.6*  MG  --  2.1  --   --   --   --  1.9  PHOS  --  4.0  --   --   --   --   --    < > = values in this interval not displayed.   GFR: Estimated Creatinine Clearance: 71.5 mL/min (by C-G formula based on SCr of 0.6 mg/dL). Liver Function Tests: Recent Labs  Lab 10/21/19 2112 10/24/19 0543  AST 24 30  ALT 19 17  ALKPHOS 59 47  BILITOT 0.9 0.6  PROT 7.3 7.0  ALBUMIN 3.9 2.8*   No results for input(s): LIPASE, AMYLASE in the last 168 hours. No results for input(s): AMMONIA in the last 168 hours. Coagulation Profile: Recent Labs  Lab 10/21/19 2112 10/23/19 0222  INR 0.9 1.0   Cardiac Enzymes: No results for input(s): CKTOTAL, CKMB, CKMBINDEX, TROPONINI in the last 168 hours. BNP (last 3 results) No results for input(s): PROBNP in the last 8760 hours. HbA1C: No results for input(s): HGBA1C in the last 72 hours. CBG: Recent Labs  Lab 10/24/19 0835  GLUCAP 153*   Lipid Profile: No results for input(s): CHOL, HDL, LDLCALC, TRIG, CHOLHDL, LDLDIRECT in the last 72 hours. Thyroid Function Tests: No results for input(s): TSH, T4TOTAL, FREET4, T3FREE, THYROIDAB in the last 72 hours. Anemia Panel: No results for input(s): VITAMINB12, FOLATE, FERRITIN, TIBC, IRON, RETICCTPCT in the last 72 hours. Sepsis Labs: Recent Labs  Lab  10/21/19 2112 10/22/19 0145 10/22/19 0757 10/23/19 0222  PROCALCITON  --   --  <0.10 0.13  LATICACIDVEN 1.8 0.8  --   --     Recent Results (from the past 240 hour(s))  Culture, blood (Routine x 2)     Status: None   Collection Time: 10/21/19  9:12 PM   Specimen: BLOOD  Result Value Ref Range Status   Specimen Description BLOOD LEFT ANTECUBITAL  Final   Special Requests   Final    BOTTLES DRAWN AEROBIC AND ANAEROBIC Blood Culture adequate volume   Culture   Final    NO GROWTH 5 DAYS Performed at [facility], [address] [place], [place] [address]    Report Status 10/26/2019 FINAL  Final  Culture, blood (Routine x 2)     Status: None   Collection Time: 10/22/19  1:45 AM   Specimen: BLOOD  Result Value Ref Range Status   Specimen Description BLOOD BLOOD LEFT HAND  Final   Special Requests   Final    BOTTLES DRAWN AEROBIC AND ANAEROBIC Blood Culture results may not be optimal due to an excessive volume of blood received in culture bottles   Culture   Final    NO GROWTH 5 DAYS Performed at [facility], [address] [place], [place] [address]    Report Status 10/27/2019 FINAL  Final  SARS Coronavirus 2 by RT PCR (hospital order, performed in [place] hospital lab) Nasopharyngeal Nasopharyngeal Swab     Status: None   Collection Time: 10/22/19  2:04 AM   Specimen: Nasopharyngeal Swab  Result Value Ref Range Status   SARS Coronavirus 2 NEGATIVE NEGATIVE Final    Comment: (NOTE) SARS-CoV-2 target nucleic acids are NOT DETECTED.  The SARS-CoV-2 RNA is generally detectable  in upper and lower respiratory specimens during the acute phase of infection. The lowest concentration of SARS-CoV-2 viral copies this assay can detect is 250 copies / mL. A negative result does not preclude SARS-CoV-2 infection and should not be used as the sole basis for treatment or other patient management decisions.  A negative result may occur with improper specimen  collection / handling, submission of specimen other than nasopharyngeal swab, presence of viral mutation(s) within the areas targeted by this assay, and inadequate number of viral copies (<250 copies / mL). A negative result must be combined with clinical observations, patient history, and epidemiological information.  Fact Sheet for Patients:   StrictlyIdeas.no  Fact Sheet for Healthcare Providers: BankingDealers.co.za  This test is not yet approved or  cleared by the [place] FDA and has been authorized for detection and/or diagnosis of SARS-CoV-2 by FDA under an Emergency Use Authorization (EUA).  This EUA will remain in effect (meaning this test can be used) for the duration of the COVID-19 declaration under Section 564(b)(1) of the Act, 21 U.S.C. section 360bbb-3(b)(1), unless the authorization is terminated or revoked sooner.  Performed at [facility], [address] [place], [place] [address]   Urine culture     Status: Abnormal   Collection Time: 10/22/19  8:34 AM   Specimen: Urine, Random  Result Value Ref Range Status   Specimen Description   Final    URINE, RANDOM Performed at [facility], [address] [place], [place] [address]    Special Requests   Final    NONE Performed at [facility], [address] [place], [place] [address]    Culture (A)  Final    10,000 COLONIES/mL MULTIPLE SPECIES PRESENT, SUGGEST RECOLLECTION   Report Status 10/23/2019 FINAL  Final  MRSA PCR Screening     Status: None   Collection Time: 10/24/19  5:52 PM   Specimen: Nasal Mucosa; Nasopharyngeal  Result Value Ref Range Status   MRSA by PCR NEGATIVE NEGATIVE Final    Comment:        The GeneXpert MRSA Assay (FDA approved for NASAL specimens only), is one component of a comprehensive MRSA colonization surveillance program. It is not intended to diagnose MRSA infection nor to guide or monitor  treatment for MRSA infections. Performed at [facility], [address], [place],  [address]       Imaging Studies   No results found.   Medications   Scheduled Meds: . vitamin C  1,000 mg Oral QHS  . B-complex with vitamin C  1 tablet Oral QPM  . calcium citrate-vitamin D  1 tablet Oral Daily  . diltiazem  180 mg Oral Daily  . doxycycline  100 mg Oral Q12H  . enoxaparin (LOVENOX) injection  40 mg Subcutaneous Q24H  . levothyroxine  75 mcg Oral Q0600  . pantoprazole  40 mg Oral Daily  . pyridostigmine  60 mg Oral Q8H  . sodium chloride flush  10-40 mL Intracatheter Q12H  . sucralfate  1 g Oral BID  . vitamin E  400 Units Oral Q24H   Continuous Infusions: . sodium chloride    . ceFEPime (MAXIPIME) IV 2 g (10/27/19 0542)       LOS: 5 days    Time spent: 25 minutes    Ezekiel Slocumb, DO [facility]  10/27/2019, 9:33 AM    If 7PM-7AM, please contact night-coverage. How to contact the [facility] Attending or Consulting provider [place]  or covering provider during after hours [place], for this patient?    1. Check the care team in [facility] and look for a) attending/consulting TR[facility] provider listed and b) the [facility] team listed 2. Log into [email or web address] and use [place]'s universal password to access. If you do not have the password, please contact the hospital operator. 3. Locate the [facility] provider you are looking for under [facility] and page to a number that you can be directly reached. 4. If you still have difficulty reaching the provider, please page the [facility] (Director on Call) for the Hospitalists listed on amion for assistance.

## 2019-10-27 NOTE — Progress Notes (Signed)
RT Note:  NIF: -18 VC: Unable to comprehend.  Patient on room air, conversive, and in no apparent distress.

## 2019-10-27 NOTE — Progress Notes (Signed)
Subjective: Doing better with no complaints. BIPAP is off.   Objective: Current vital signs: BP (!) 152/96 (BP Location: Right Arm)   Pulse 80   Temp 98.4 F (36.9 C) (Axillary)   Resp (!) 22   Ht 5\' 2"  (1.575 m)   Wt 93 kg   SpO2 97%   BMI 37.50 kg/m  Vital signs in last 24 hours: Temp:  [97.7 F (36.5 C)-98.4 F (36.9 C)] 98.4 F (36.9 C) (09/09 0743) Pulse Rate:  [69-91] 80 (09/09 0743) Resp:  [12-22] 22 (09/09 0743) BP: (129-186)/(82-96) 152/96 (09/09 0743) SpO2:  [94 %-100 %] 97 % (09/09 0743) FiO2 (%):  [32 %] 32 % (09/09 0530) Weight:  [93 kg] 93 kg (09/09 0530)  Intake/Output from previous day: 09/08 0701 - 09/09 0700 In: 1280 [P.O.:480; I.V.:500; IV Piggyback:300] Out: 2225 [Urine:2225] Intake/Output this shift: Total I/O In: -  Out: 750 [Urine:750] Nutritional status:  Diet Order            Diet clear liquid Room service appropriate? Yes; Fluid consistency: Thin  Diet effective now                HEENT: Question of mild microcephaly.  Lungs: Respirations unlabored without BIPAP or [place] Ext: No edema  Neurologic Exam: Ment: Alert, follows all simple one-step motor commands. Speech fluent but uses short phrases only in the context of her developmental delay. Needs some help from her brother answering some questions.  Able to correctly state the year and day of the week.  CN: Pupils equal. EOMI. No nystagmus. Face symmetric Motor: 4+/5 strength BUE and BLE in the context of poor effort.  Sensory: Temp sensation intact to proximal limbs bilaterally  Reflexes: 1+ bilateral biceps, brachioradialis and patellae.  Cerebellar: No ataxia noted.  Gait: Deferred  Lab Results: Results for orders placed or performed during the hospital encounter of 10/22/19 (from the past 48 hour(s))  CBC     Status: Abnormal   Collection Time: 10/26/19  6:16 AM  Result Value Ref Range   WBC 6.3 4.0 - 10.5 K/uL   RBC 4.00 3.87 - 5.11 MIL/uL   Hemoglobin 12.2 12.0 - 15.0 g/dL    HCT 37.3 36 - 46 %   MCV 93.3 80.0 - 100.0 fL   MCH 30.5 26.0 - 34.0 pg   MCHC 32.7 30.0 - 36.0 g/dL   RDW 13.8 11.5 - 15.5 %   Platelets 143 (L) 150 - 400 K/uL   nRBC 0.0 0.0 - 0.2 %    Comment: Performed at [facility], [address] [place], [place] [address]  Basic metabolic panel     Status: Abnormal   Collection Time: 10/26/19  6:16 AM  Result Value Ref Range   Sodium 135 135 - 145 mmol/L   Potassium 3.4 (L) 3.5 - 5.1 mmol/L   Chloride 100 98 - 111 mmol/L   CO2 31 22 - 32 mmol/L   Glucose, Bld 123 (H) 70 - 99 mg/dL    Comment: Glucose reference range applies only to samples taken after fasting for at least 8 hours.   BUN 14 8 - 23 mg/dL   Creatinine, Ser 0.61 0.44 - 1.00 mg/dL   Calcium 8.4 (L) 8.9 - 10.3 mg/dL   GFR calc non Af Amer >60 >60 mL/min   GFR calc Af Amer >60 >60 mL/min   Anion gap 4 (L) 5 - 15    Comment: Performed at [facility], [address],  [place], [place] [address]  Basic metabolic panel     Status: Abnormal   Collection Time: 10/27/19  4:47 AM  Result Value Ref Range   Sodium 136 135 - 145 mmol/L   Potassium 3.8 3.5 - 5.1 mmol/L   Chloride 100 98 - 111 mmol/L   CO2 30 22 - 32 mmol/L   Glucose, Bld 109 (H) 70 - 99 mg/dL    Comment: Glucose reference range applies only to samples taken after fasting for at least 8 hours.   BUN 13 8 - 23 mg/dL   Creatinine, Ser 0.60 0.44 - 1.00 mg/dL   Calcium 8.6 (L) 8.9 - 10.3 mg/dL   GFR calc non Af Amer >60 >60 mL/min   GFR calc Af Amer >60 >60 mL/min   Anion gap 6 5 - 15    Comment: Performed at [facility], [address], [place], [place] [address]  Magnesium     Status: None   Collection Time: 10/27/19  4:47 AM  Result Value Ref Range   Magnesium 1.9 1.7 - 2.4 mg/dL    Comment: Performed at [facility], [address] [place], [place] [address]    Recent Results (from the past 240 hour(s))  Culture, blood (Routine x 2)     Status: None   Collection  Time: 10/21/19  9:12 PM   Specimen: BLOOD  Result Value Ref Range Status   Specimen Description BLOOD LEFT ANTECUBITAL  Final   Special Requests   Final    BOTTLES DRAWN AEROBIC AND ANAEROBIC Blood Culture adequate volume   Culture   Final    NO GROWTH 5 DAYS Performed at [facility], [address] [place], [place] [address]    Report Status 10/26/2019 FINAL  Final  Culture, blood (Routine x 2)     Status: None   Collection Time: 10/22/19  1:45 AM   Specimen: BLOOD  Result Value Ref Range Status   Specimen Description BLOOD BLOOD LEFT HAND  Final   Special Requests   Final    BOTTLES DRAWN AEROBIC AND ANAEROBIC Blood Culture results may not be optimal due to an excessive volume of blood received in culture bottles   Culture   Final    NO GROWTH 5 DAYS Performed at [facility], [address] [place], [place] [address]    Report Status 10/27/2019 FINAL  Final  SARS Coronavirus 2 by RT PCR (hospital order, performed in [place] hospital lab) Nasopharyngeal Nasopharyngeal Swab     Status: None   Collection Time: 10/22/19  2:04 AM   Specimen: Nasopharyngeal Swab  Result Value Ref Range Status   SARS Coronavirus 2 NEGATIVE NEGATIVE Final    Comment: (NOTE) SARS-CoV-2 target nucleic acids are NOT DETECTED.  The SARS-CoV-2 RNA is generally detectable in upper and lower respiratory specimens during the acute phase of infection. The lowest concentration of SARS-CoV-2 viral copies this assay can detect is 250 copies / mL. A negative result does not preclude SARS-CoV-2 infection and should not be used as the sole basis for treatment or other patient management decisions.  A negative result may occur with improper specimen collection / handling, submission of specimen other than nasopharyngeal swab, presence of viral mutation(s) within the areas targeted by this assay, and inadequate number of viral copies (<250 copies / mL). A negative result must be combined  with clinical observations, patient history, and epidemiological information.  Fact Sheet for Patients:   StrictlyIdeas.no  Fact Sheet for  Healthcare Providers: BankingDealers.co.za  This test is not yet approved or  cleared by the [place] and has been authorized for detection and/or diagnosis of SARS-CoV-2 by FDA under an Emergency Use Authorization (EUA).  This EUA will remain in effect (meaning this test can be used) for the duration of the COVID-19 declaration under Section 564(b)(1) of the Act, 21 U.S.C. section 360bbb-3(b)(1), unless the authorization is terminated or revoked sooner.  Performed at [facility], [address] [place], [place] [address]   Urine culture     Status: Abnormal   Collection Time: 10/22/19  8:34 AM   Specimen: Urine, Random  Result Value Ref Range Status   Specimen Description   Final    URINE, RANDOM Performed at [facility], [address] [place], [place] [address]    Special Requests   Final    NONE Performed at [facility] [address] [place], [place] [address]    Culture (A)  Final    10,000 COLONIES/mL MULTIPLE SPECIES PRESENT, SUGGEST RECOLLECTION   Report Status 10/23/2019 FINAL  Final  MRSA PCR Screening     Status: None   Collection Time: 10/24/19  5:52 PM   Specimen: Nasal Mucosa; Nasopharyngeal  Result Value Ref Range Status   MRSA by PCR NEGATIVE NEGATIVE Final    Comment:        The GeneXpert MRSA Assay (FDA approved for NASAL specimens only), is one component of a comprehensive MRSA colonization surveillance program. It is not intended to diagnose MRSA infection nor to guide or monitor treatment for MRSA infections. Performed at [facility], [address] [place], [place] [address]     Lipid Panel No results for input(s): CHOL, TRIG, HDL, CHOLHDL, VLDL, LDLCALC in the last 72 hours.  Studies/Results: No  results found.  Medications:  Scheduled: . vitamin C  1,000 mg Oral QHS  . B-complex with vitamin C  1 tablet Oral QPM  . calcium citrate-vitamin D  1 tablet Oral Daily  . diltiazem  180 mg Oral Daily  . doxycycline  100 mg Oral Q12H  . enoxaparin (LOVENOX) injection  40 mg Subcutaneous Q24H  . levothyroxine  75 mcg Oral Q0600  . pantoprazole  40 mg Oral Daily  . pyridostigmine  60 mg Oral Q8H  . sodium chloride flush  10-40 mL Intracatheter Q12H  . sucralfate  1 g Oral BID  . vitamin E  400 Units Oral Q24H   Continuous: . sodium chloride    . ceFEPime (MAXIPIME) IV 2 g (10/27/19 0542)     Assessment: 69 year old female with myasthenia gravis presenting with acute hypoxic and hypercarbic respiratory failurein the setting of LLL PNA and sepsis.  1. Brief review of history: Her myasthenia gravis was diagnosed in 2011. She has developmental delay and seizures (not currently on an anticonvulsant). Had recent chemotherapy and external beam radiation therapy for invasive thymoma ([place] IVathymoma) at which time her chronic immunosuppressive therapy (methotrexate) for myasthenia gravis was discontinued. She presented to [facility] with leukocytosis, right sided chest pain, sensation of feeling short of breath, with left base opacity and hypoinflation found on chest imaging. She was started on ABX. Neurology was consulted for possible MG exacerbation. She was started on IVIG and completed day 5/5 yesterday (Wednesday).  2. Of note, her MG gravis symptoms had improved markedly after thymoma ablation (as of her RadOnc follow up note from 9/2). Unclear if this was due to thymoma ablation itself or possibly the  immunosuppressive effect of her chemotherapy (carboplatin).  3. Of note, her methotrexate, which was administered as four 2.5 mg tablets qSat, was stopped in April when the patient started chemotherapy for her thymoma. After the completion of her chemotherapy regimen, it was decided to  continue off methotrexate. This is her first exacerbation since the initial exacerbation in 2011 which led to her diagnosis of MG.  4. She is significantly improved today, breathing normally off BIPAP.  5. NIF today is -20. Patient was unable to have FVC measured due to inability to follow instructions for assessment.   Recommendations: -Continue Mestinon 60 mg BID -Continue totreat pneumonia -NIF and FVC q6h -Close neuro checks and monitoring of airway -At discharge, family will need to discuss with Neurologist Dr. Beacher May regarding possibly restarting prednisone and/or methotrexate. Of note, it is uncertain whether her worsened respirations were entirely due to PNA or if there was a component of increased diaphragmatic weakness due to a possible MG exacerbation.  -Neurohospitalist team will sign off. Please call if there are additional questions.     LOS: 5 days   @Electronically  signed: Dr. Kerney Elbe 10/27/2019  8:57 AM

## 2019-10-27 NOTE — Progress Notes (Signed)
RT note:  NIF: -20 best effort VC: Unable to understand procedure despite multiple attempts.  Patient on RA, conversive in full sentences and in no apparent distress.

## 2019-10-28 LAB — PROCALCITONIN: Procalcitonin: <0.1 ng/mL

## 2019-10-28 LAB — BRAIN NATRIURETIC PEPTIDE: B Natriuretic Peptide: 33.9 pg/mL (ref 0.0–100.0)

## 2019-10-28 MED ORDER — DILTIAZEM HCL ER COATED BEADS 180 MG PO CP24: 180.0000 mg | ORAL_CAPSULE | Freq: Every day | ORAL | 2 refills | Status: DC

## 2019-10-28 NOTE — Care Management Important Message (Signed)
Important Message  Patient Details  Name: Madison Burns MRN: [id number] Date of Birth: 09/23/1950   Medicare Important Message Given:  Yes     Dannette Barbara 10/28/2019, 1:46 PM

## 2019-10-28 NOTE — Progress Notes (Signed)
NIF - 21cmH2O

## 2019-10-28 NOTE — Progress Notes (Signed)
Physical Therapy Treatment Patient Details Name: Madison Burns MRN: [id number] DOB: 04-Nov-1950 Today's Date: 10/28/2019    History of Present Illness Pt is a 69 y.o. female presenting to hospital 9/4 with R sided chest pain and SOB.  Pt admitted with sepsis d/t community acquired PNA L lower lobe, myasthenia gravis with possible flare, and new onset a-fib with RVR.  Rapid response 9/6 d/t chest pain.  PMH includes htn, OSA, IBS, myasthenia gravis, thymoma, s/p radiation therapy 4 months ago; renal CA, h/o seizure disorder.    PT Comments    Pt resting in bed upon PT arrival; pt utilized purewick prior to getting OOB but then after sitting up on edge of bed pt requesting to use BSC to urinate so pt assisted to [facility] for toileting needs.  During session pt SBA with bed mobility; CGA progressing to SBA with transfers; and CGA ambulating 40 feet x3 trials (limited distance d/t SOB).  First trial ambulating pt using RW but requiring cueing to stay closer to walker and for safety.  2nd and 3rd trial ambulating without any AD and pt appearing steady without UE support (vc's to slow down though).  Pt's HR 94 bpm at rest but increased to 122-124 bpm with ambulation and O2 sats 92% or greater on room air (HR decreased back towards resting rate with sitting rest); nurse notified of pt's vitals and mobility during session.  Will continue to focus on strengthening and progressive functional mobility per pt tolerance.   Follow Up Recommendations  Home health PT;Supervision/Assistance - 24 hour     Equipment Recommendations  3in1 (PT)    Recommendations for Other Services       Precautions / Restrictions Precautions Precautions: Fall Precaution Comments: Keep O2 >92% Restrictions Weight Bearing Restrictions: No    Mobility  Bed Mobility Overal bed mobility: Needs Assistance Bed Mobility: Supine to Sit;Sit to Supine     Supine to sit: Supervision;HOB elevated Sit to supine: Supervision;HOB  elevated   General bed mobility comments: mild increased time to perform on own  Transfers Overall transfer level: Needs assistance Equipment used: Rolling walker (2 wheeled);None Transfers: Sit to/from [facility] Sit to Stand: Min guard;Supervision (x5 trials from bed (2x's with RW and 3x's no AD); x1 trial from [facility] (no AD)) Stand pivot transfers: Min guard;Supervision (stand step turn bed to/from [facility] with pt using UE support as needed on furniture during transfer)       General transfer comment: pt using momentum initially to stand (decreased momentum use with standing repetition though); controlled descent sitting  Ambulation/Gait Ambulation/Gait assistance: Min guard;+2 safety/equipment Gait Distance (Feet):  (40 feet x3) Assistive device: Rolling walker (2 wheeled);None       General Gait Details: 1st trial ambulating with RW x40 feet pt requiring vc's to stay closer to RW and walker safety; 2nd and 3rd trial ambulating no AD 40 feet x2 (improved balance and posture noted on 3rd trial of ambulation; increased cadence noted with ambulation--therapist gave pt vc's to slow down but pt was steady without loss of balance)   Stairs             Wheelchair Mobility    Modified Rankin (Stroke Patients Only)       Balance Overall balance assessment: Needs assistance Sitting-balance support: No upper extremity supported;Feet supported Sitting balance-Leahy Scale: Normal Sitting balance - Comments: steady sitting reaching outside BOS   Standing balance support: No upper extremity supported;During functional activity Standing balance-Leahy Scale: Good Standing balance  comment: no loss of balance with ambulation no AD                            Cognition Arousal/Alertness: Awake/alert Behavior During Therapy: Flat affect Overall Cognitive Status: No family/caregiver present to determine baseline cognitive functioning                                  General Comments: Pleasant; alert; increased time to process information and commands      Exercises     General Comments Nursing cleared pt for participation in physical therapy.  Pt agreeable to PT session.      Pertinent Vitals/Pain Pain Assessment: No/denies pain Pain Intervention(s): Limited activity within patient's tolerance;Monitored during session;Repositioned    Home Living                      Prior Function            PT Goals (current goals can now be found in the care plan section) Acute Rehab PT Goals Patient Stated Goal: get better and go home with sister PT Goal Formulation: With patient Time For Goal Achievement: 11/09/19 Potential to Achieve Goals: Good Progress towards PT goals: Progressing toward goals    Frequency    Min 2X/week      PT Plan Current plan remains appropriate    Co-evaluation              AM-PAC PT "6 Clicks" Mobility   Outcome Measure  Help needed turning from your back to your side while in a flat bed without using bedrails?: None Help needed moving from lying on your back to sitting on the side of a flat bed without using bedrails?: A Little Help needed moving to and from a bed to a chair (including a wheelchair)?: A Little Help needed standing up from a chair using your arms (e.g., wheelchair or bedside chair)?: A Little Help needed to walk in hospital room?: A Little Help needed climbing 3-5 steps with a railing? : A Little 6 Click Score: 19    End of Session Equipment Utilized During Treatment: Gait belt Activity Tolerance: Patient tolerated treatment well Patient left: in bed;with call bell/phone within reach;with bed alarm set;with nursing/sitter in room Nurse Communication: Mobility status;Precautions;Other (comment) (pt's vitals during session) PT Visit Diagnosis: Other abnormalities of gait and mobility (R26.89);Muscle weakness (generalized) (M62.81);Difficulty in walking, not  elsewhere classified (R26.2)     Time: 0347-4259 PT Time Calculation (min) (ACUTE ONLY): 39 min  Charges:  $Gait Training: 8-22 mins $Therapeutic Activity: 23-37 mins                    Leitha Bleak, PT 10/28/19, 12:14 PM

## 2019-10-28 NOTE — Progress Notes (Signed)
Daily Progress Note   Patient Name: Madison Burns       Date: 10/28/2019 DOB: May 03, 1950  Age: 69 y.o. MRN#: [id number] Attending Physician: Ezekiel Slocumb, DO Primary Care Physician: Einar Pheasant, MD Admit Date: 10/22/2019  Reason for Consultation/Follow-up: Establishing goals of care  Subjective: Feels good, waiting for lunch. No family at bedside.   Length of Stay: 6  Current Medications: Scheduled Meds:  . vitamin C  1,000 mg Oral QHS  . B-complex with vitamin C  1 tablet Oral QPM  . calcium citrate-vitamin D  1 tablet Oral Daily  . diltiazem  180 mg Oral Daily  . doxycycline  100 mg Oral Q12H  . DULoxetine  30 mg Oral Daily  . enoxaparin (LOVENOX) injection  40 mg Subcutaneous Q24H  . levothyroxine  75 mcg Oral Q0600  . pantoprazole  40 mg Oral Daily  . pyridostigmine  60 mg Oral Q8H  . risperiDONE  2 mg Oral QHS  . sodium chloride flush  10-40 mL Intracatheter Q12H  . sucralfate  1 g Oral BID  . vitamin E  400 Units Oral Q24H    Continuous Infusions: . sodium chloride Stopped (10/27/19 1839)  . cefTRIAXone (ROCEPHIN)  IV 1 g (10/27/19 2301)    PRN Meds: sodium chloride, acetaminophen **OR** acetaminophen, EPINEPHrine, ondansetron **OR** ondansetron (ZOFRAN) IV, sodium chloride flush  Physical Exam Constitutional:      General: She is not in acute distress. Pulmonary:     Effort: Pulmonary effort is normal.  Skin:    General: Skin is warm and dry.  Neurological:     Mental Status: She is alert and oriented to person, place, and time.             Vital Signs: BP 140/86   Pulse (!) 105   Temp 98.1 F (36.7 C) (Axillary)   Resp (!) 23   Ht 5\' 2"  (1.575 m)   Wt 97 kg   SpO2 95%   BMI 39.10 kg/m  SpO2: SpO2: 95 % O2 Device: O2 Device: Room Air O2 Flow Rate: O2  Flow Rate (L/min): 3 L/min  Intake/output summary:   Intake/Output Summary (Last 24 hours) at 10/28/2019 1350 Last data filed at 10/28/2019 6226 [place] per 24 hour  Intake 1064.17 ml  Output 1200 ml  Net -135.83 ml   LBM: Last BM Date: 10/26/19 Baseline Weight: Weight: 92.5 kg Most recent weight: Weight: 97 kg       Palliative Assessment/Data: PPS 40%    Flowsheet Rows     Most Recent Value  Intake Tab  Referral Department Hospitalist  Unit at Time of Referral Cardiac/Telemetry Unit  Palliative Care Primary Diagnosis Sepsis/Infectious Disease  Date Notified 10/24/19  Palliative Care Type New Palliative care  Reason for referral Clarify Goals of Care  Date of Admission 10/22/19  Date first seen by Palliative Care 10/26/19  # of days Palliative referral response time 2 Day(s)  # of days IP prior to Palliative referral 2  Clinical Assessment  Palliative Performance Scale Score 40%  Psychosocial & Spiritual Assessment  Palliative Care Outcomes  Patient/Family meeting held? Yes  Who was at the meeting? brother  [place]  goals of care      Patient Active Problem List   Diagnosis Date Noted  . Acute respiratory failure with hypoxia ([place])   . Palliative care by specialist   . Acute respiratory distress   . Pressure injury of skin 10/24/2019  . Atrial fibrillation with RVR ([place]) 10/23/2019  . Community acquired pneumonia 10/23/2019  . Acute respiratory failure with hypoxia and hypercapnia ([facility])   . Sepsis ([place]) 10/22/2019  . Hypothyroidism   . Left renal mass 08/17/2019  . Left shoulder pain 07/18/2019  . Mediastinal mass 04/11/2019  . Renal cell cancer, left ([place]) 03/09/2019  . Thymoma 03/09/2019  . Chest pain 12/05/2018  . Fullness of breast 11/30/2018  . History of seizure disorder 01/05/2018  . Gastroesophagitis 07/02/2017  . Cough 04/25/2017  . Epilepsy, generalized, convulsive ([place]) 02/23/2017  . OSA (obstructive sleep apnea)  02/23/2017  . Mood disorder ([place]) 10/01/2016  . Pain in right toe(s) 06/17/2015  . MG (myasthenia gravis) ([place]) 06/06/2015  . OSA on CPAP 06/06/2015  . Dysuria 02/12/2015  . Goals of care, counseling/discussion 06/12/2014  . Obstructive sleep apnea syndrome 05/03/2014  . MG with exacerbation (myasthenia gravis) ([place]) 04/10/2014  . Hypersomnia with sleep apnea 04/10/2014  . Obesity (BMI 30-39.9) 01/29/2014  . Hyperglycemia 01/29/2014  . Abdominal pain 01/29/2014  . Melanoma ([place]) 01/23/2013  . Hypercholesterolemia 12/21/2011  . Hypertension 12/21/2011  . Myasthenia gravis ([place]) 12/21/2011  . Seizure disorder ([place]) 12/21/2011  . Irritable bowel 12/21/2011    Palliative Care Assessment & Plan   HPI: 69 y.o. female  with past medical history of developmental delay, HTN, hypothyroidism, OSA, myasthenia gravis, seizures, thymoma s/p radiation 4 months ago, and [facility] admitted on 10/22/2019 with shortness of breath, R side chest pain, and fever.  Diagnosed with sepsis d/t LLL pna. Worsening respiratory status - thought to be d/t neuromuscular weakness/ MG flare - started on bipap and IVIG. PMT consulted to discuss [place].   Assessment: Multiple attempts to discuss [place] with patient's sister who is her caregiver - other family members defer to her for [place]. No success contacting sister. However, patient continues to improve. No complaints this AM. Eating well. Off bipap 2 days. On room air. Tolerating therapy well. Patient agreeable to what is recommended by attending. Plans for d/c with home health today.  Recommendations/Plan: No further needs - to d/c with home health today Has improved - goals clear for bipap only, otherwise DNR - unable to complete further AD d/t caregiver unavailable  Code Status: Limited code - bipap only  Prognosis:  Unable to determine  Discharge Planning: Home with [place] was discussed with patient  Thank you for allowing the Palliative Medicine  Team to assist in the care of this patient.   Total Time 15 minutes Prolonged Time Billed  no       Greater than 50%  of this time was spent counseling and coordinating care related to the above assessment and plan.  Juel Burrow, DNP, [facility] Palliative Medicine Team Team Phone # [phone]  Pager [phone]

## 2019-10-28 NOTE — Discharge Summary (Signed)
Physician Discharge Summary  Madison Burns UXL:[id number] DOB: 20-Oct-1950 DOA: 10/22/2019  PCP: Madison Pheasant, MD  Admit date: 10/22/2019 Discharge date: 10/28/2019  Admitted From: home Disposition:  home  Recommendations for Outpatient Follow-up:  1. Follow up with PCP in 1-2 weeks 2. Please obtain BMP/CBC in one week 3. Please follow up with Neurologist Dr. Beacher Burns 4. Patient had new onset of A-fib with RVR during admission, secondary to sepsis and respiratory failure with pneumonia.  She was started on Cardizem with good heart rate control.  Now in sinus rhythm.  Since onset of A-fib was due to a reversible condition, cardiology deferred starting on anticoagulation.   Consider referral to cardiology.  Home Health: PT, OT, RN  Equipment/Devices: None   Discharge Condition: Stable  CODE STATUS: Partial - no code blue, no CPR, no intubation, Ok for NIV/Bipap   Diet recommendation: Heart Healthy    Discharge Diagnoses: Principal Problem:   Sepsis ([place]) Active Problems:   Hypertension   Obesity (BMI 30-39.9)   MG with exacerbation (myasthenia gravis) (HCC)   OSA (obstructive sleep apnea)   Left renal mass   Hypothyroidism   Atrial fibrillation with RVR ([place])   Community acquired pneumonia   Pressure injury of skin   Acute respiratory distress   Acute respiratory failure with hypoxia ([place])   Palliative care by specialist    Summary of HPI and Hospital Course:  Madison Burns is a 69 y.o. F with developmental delay, MG on pyridostigmine, invasive thymoma s/p radiation on carboplatin this year, renal cancer s/p cryoablation, OSA on CPAP,  hx seizures not currently on AED, and hypothyroidism who presented with chest pain and cough.   In the ER, found to have leukocytosis, left base opacity on chest imaging.  Neurology consulted given concern for MG flare.  Started on antibiotics, IVIG.    Sepsisdue to community-acquired pneumonia, left lower lobe Sepsis was present on admission  as evidenced by tachypnea and tachycardia, leukocytosis and CXR concerning for L base infiltrate (vs atelectasis).  Started on empiric antibiotics and admitted.  Sepsis physiology resolved with therapies below.  She developed worsening respiratory status, felt to be from neuromuscular weakness secondary to MG flare. Neuro consulted, BiPAP started and IVIG started, completed course 9/8.  Failed to obtain sputum culture. Blood cultures negative. Treated with Cefepime>>Rocephin, and Doxycycline Tolerated BiPAP well and weaned without difficulty once clinically improved. --Outpatient follow up with Dr. Beacher Burns -  --Neurology recommended to discuss resuming MTX or prednisone to prevent future MG flares. -treated intermittently with Lasix for edema with good response.    Myasthenia gravis with possible flare Patient had 1 flare of myasthenia in 2011 has been stable on Mestinon since then.   Has been off methotrexate since chemotherapy for her thymoma (it was stopped for chemo, decided to remain off it), possibly with MG flare.  Reduced NIF and clinically to have reduced chest wall movement, shallow inspirations.  Depth of inspirations imprved. Neurology consulted.  Completed 5 days of IVIG.   --Neurology and PCCM consulted.   -Continue pyridostigmineTID -Responded well to NIPPV and weaned off    New onset atrial fibrillation with RVR -  Paroxysmal by definition. CHA2DS2-VASc 2. Cardiology consulted, recommend no anticoagluation at this time.  Initially on diltiazem drip, converted back to sinus rhythm.  TSH normal. -Continue oral Cardizem -Consulted cardiology --defer anticoagulation as A-fib due to underlying acute illness and in sinus with Cardizem   Chest pain - not POA, had episode of chest pain AM  of 9/7. Serial troponins normal, ECG without ST changes.  Monitor.  No further recurrence.  Hypothyroidism - continue levothyroxine.  TSH normal.  Invasive thymoma -  s/p chemotherapy.  Oncology was consulted, imaging reviewed and thymoma noted to have decreased in size, no mediastinal lymphadenopathy or pleural nodules.  No further treatment recommended.  Suspected MG exacerbations secondary to acute infection.  Hx of Renal cancer - no acute issues  Obstructive Sleep apnea - BiPAP ordered   Depression and cognitive delay -  --Resumeduloxetine, Risperdal  GERD/PUD - continue PPI, sucralfate  Hyponatremia - Resolved.  Was mild, and patient asymptomatic.  Follow BMP.   Overactive bladder - continue fesoterodine   Obesity: Body mass index is 37.5 kg/m.  Complicates overall care and prognosis.   Discharge Instructions   Discharge Instructions    Call MD for:   Complete by: As directed    Chest pain, feel heart racing, shortness of breath or difficulty breathing, if you're not able to take full deep breaths.   Call MD for:  extreme fatigue   Complete by: As directed    Call MD for:  persistant dizziness or light-headedness   Complete by: As directed    Call MD for:  severe uncontrolled pain   Complete by: As directed    Call MD for:  temperature >100.4   Complete by: As directed    Diet - low sodium heart healthy   Complete by: As directed    Discharge instructions   Complete by: As directed    You completed the full course of antibiotics for pneumonia while you were in the hospital.   You will not need antibiotics at home.  Continue to use cough medicine if you need it.  Please schedule an appointment to see your Neurologist Dr. Beacher Burns to follow up for your Myasthenia gravis.   Increase activity slowly   Complete by: As directed    No wound care   Complete by: As directed      Allergies as of 10/28/2019   No Known Allergies     Medication List    TAKE these medications   B-complex with vitamin C tablet Take 1 tablet by mouth every evening.   benzonatate 100 MG capsule Commonly known as: TESSALON Take 1 capsule  (100 mg total) by mouth in the morning and at bedtime. MORNING & 1430   Biotin 10000 MCG Tabs Take 10,000 mcg by mouth daily at 2 PM. (1430)   CITRACAL +D3 PO Take 1 tablet by mouth daily.   diltiazem 180 MG 24 hr capsule Commonly known as: CARDIZEM CD Take 1 capsule (180 mg total) by mouth daily. Start taking on: October 29, 2019   DULoxetine 30 MG capsule Commonly known as: CYMBALTA Take 1 capsule (30 mg total) by mouth daily.   Garlic 1638 MG Caps Take 1,000 mg by mouth in the morning, at noon, and at bedtime. (0800, 1430, 2000)   ketoconazole 2 % cream Commonly known as: NIZORAL SMARTSIG:1 Application Topical 1 to 2 Times Daily   levothyroxine 75 MCG tablet Commonly known as: SYNTHROID TAKE 1 TABLET BY MOUTH DAILY ON AN EMPTYSTOMACH. WAIT 30 MINUTES BEFORE TAKING OTHER MEDS What changed: See the new instructions.   pantoprazole 40 MG tablet Commonly known as: PROTONIX Take 40 mg by mouth daily.   PROBIOTIC MULTI-ENZYME PO Take 1 capsule by mouth in the morning and at bedtime. Probiotic Multi-Enzyme Digestive Formula   pyridostigmine 60 MG tablet Commonly known as: MESTINON TAKE  1 TABLET BY MOUTH AT  8 AM, ONE AT LUNCH AND ONE AT 8PM   risperiDONE 2 MG tablet Commonly known as: RISPERDAL Take 2 mg by mouth at bedtime.   sucralfate 1 g tablet Commonly known as: CARAFATE Take 1 g by mouth 2 (two) times daily. (0800 & 1700)   tolterodine 4 MG 24 hr capsule Commonly known as: DETROL LA TAKE 1 CAPSULE BY MOUTH ONCE EVERY MORNING What changed: See the new instructions.   vitamin C 1000 MG tablet Take 1,000 mg by mouth at bedtime.   vitamin E 180 MG (400 UNITS) capsule Take 400 Units by mouth daily at 2 PM. 1430       No Known Allergies  Consultations:  Neurology  PCCM  Oncology   Procedures/Studies: DG Chest 2 View  Result Date: 10/21/2019 CLINICAL DATA:  Sepsis EXAM: CHEST - 2 VIEW COMPARISON:  January 01, 2019 FINDINGS: The heart size and  mediastinal contours are within normal limits. Overall shallow degree of aeration with patchy airspace opacity at the left lung base. The right lung is clear. No pleural effusion no acute osseous abnormality. IMPRESSION: Shallow degree of aeration with patchy airspace opacity at the left lung base which could be due to atelectasis and/or infectious etiology. Electronically Signed   By: Prudencio Pair M.D.   On: 10/21/2019 21:33   CT Angio Chest PE W/Cm &/Or Wo Cm  Result Date: 10/22/2019 CLINICAL DATA:  Right-sided chest pain.  Shortness of breath. EXAM: CT ANGIOGRAPHY CHEST WITH CONTRAST TECHNIQUE: Multidetector CT imaging of the chest was performed using the standard protocol during bolus administration of intravenous contrast. Multiplanar CT image reconstructions and MIPs were obtained to evaluate the vascular anatomy. CONTRAST:  134mL OMNIPAQUE IOHEXOL 350 MG/ML SOLN COMPARISON:  Chest CT 03/07/2019 FINDINGS: Cardiovascular: Cardiomegaly without pericardial effusion. Limited atheromatous changes for age. Moderate to intermittently significant CTA degradation due to motion, bolus dispersion, and streak. No definite pulmonary emboli. Mediastinum/Nodes: Interval contraction of the anterior mediastinal mass with coarse calcifications. No progressive finding or adenopathy. Lungs/Pleura: Volume loss and bandlike opacity in the paramedian right upper lobe, suspect radiation fibrosis. Atelectasis at the left base. No edema, effusion, or pneumothorax Upper Abdomen: Large spleen, also seen on prior with 13 cm anterior to posterior span and 6 cm thickness. Musculoskeletal: No acute or aggressive finding Review of the MIP images confirms the above findings. IMPRESSION: 1. Multifactorial degradation of the CTA with no definite pulmonary embolism. 2. Moderate atelectasis at the left base. A similar opacity in the paramediastinal right upper lobe is likely radiation related. 3. Regressed thymic mass. No mediastinal adenopathy  or pleural nodules. Electronically Signed   By: Monte Fantasia M.D.   On: 10/22/2019 08:24   US Abdomen Complete  Result Date: 10/23/2019 CLINICAL DATA:  Abdominal pain. The patient had ablation of a lower pole left kidney mass earlier this year. EXAM: ABDOMEN ULTRASOUND COMPLETE COMPARISON:  Abdomen CT dated 06/15/2019. Abdomen ultrasound dated 05/01/2017. FINDINGS: Gallbladder: Surgically absent. Common bile duct: Diameter: 5.0 mm proximally Liver: No focal lesion identified. Within normal limits in parenchymal echogenicity. Portal vein is patent on color Doppler imaging with normal direction of blood flow towards the liver. IVC: No abnormality visualized. Pancreas: Visualized portion unremarkable. Spleen: Size and appearance within normal limits. Right Kidney: Length: 8.0 cm. Diffuse renal cortical thickening with prominent renal sinus fat. Normal echotexture. No hydronephrosis. Left Kidney: Length: 10.9 cm. Normal echotexture. Stable mildly prominent, oval, extrarenal pelvis adjacent to the lower pole. The previously  demonstrated lower pole solid mass is not well delineated. Abdominal aorta: No aneurysm visualized. Other findings: None. IMPRESSION: 1. No acute abnormality. 2. The previously demonstrated lower pole left renal mass is not well delineated with ultrasound today. This could be better visualized with pre and postcontrast MRI of the kidneys on an elective outpatient basis if clinically indicated. Electronically Signed   By: Claudie Revering M.D.   On: 10/23/2019 10:56   DG Chest Port 1 View  Result Date: 10/27/2019 CLINICAL DATA:  Acute respiratory failure with hypoxia. EXAM: PORTABLE CHEST 1 VIEW COMPARISON:  10/24/2019 FINDINGS: The patient is mildly rotated to the right with unchanged cardiomediastinal silhouette. Confluent opacity in the left lung base is stable to slightly progressive and Burns reflect a combination of a pleural effusion and associated atelectasis or consolidation.  Paramediastinal density in the right lung is unchanged with appearance on CT suggesting radiation fibrosis. No pneumothorax is identified. IMPRESSION: Stable to slightly progressive left basilar opacity, likely a combination of a pleural effusion and atelectasis or consolidation. Electronically Signed   By: Logan Bores M.D.   On: 10/27/2019 10:53   DG Chest Port 1 View  Result Date: 10/24/2019 CLINICAL DATA:  Acute respiratory distress. EXAM: PORTABLE CHEST 1 VIEW COMPARISON:  October 21, 2019. FINDINGS: Stable cardiomediastinal silhouette. Mild central pulmonary vascular congestion is noted. No pneumothorax is noted. Hypoinflation of the lungs is noted. Mild left basilar atelectasis or infiltrate is noted with associated pleural effusion. Bony thorax is unremarkable. IMPRESSION: Hypoinflation of the lungs. Mild left basilar atelectasis or infiltrate is noted with associated pleural effusion. Mild central pulmonary vascular congestion is noted. Electronically Signed   By: Marijo Conception M.D.   On: 10/24/2019 09:11   ECHOCARDIOGRAM COMPLETE  Result Date: 10/25/2019    ECHOCARDIOGRAM REPORT   Patient Name:   JACQELINE BROERS Westrich Date of Exam: 10/24/2019 Medical Rec #:  [id number]       Height:       62.0 in Accession #:    [id number]      Weight:       204.0 lb Date of Birth:  Mar 20, 1950       BSA:          1.928 m Patient Age:    47 years        BP:           132/84 mmHg Patient Gender: F               HR:           128 bpm. Exam Location:  [facility] Procedure: 2D Echo, Color Doppler and Cardiac Doppler Indications:     I48.91 Atrial Fibrillation  History:         Patient has no prior history of Echocardiogram examinations.                  Risk Factors:Sleep Apnea, Hypertension and HCL.  Sonographer:     Charmayne Sheer RDCS (AE) Referring Phys:  [id number] Collier Bullock Diagnosing Phys: Serafina Royals MD  Sonographer Comments: Suboptimal parasternal window and no subcostal window. Image acquisition challenging due to patient  body habitus, Image acquisition challenging due to respiratory motion and tachycardia. IMPRESSIONS  1. Left ventricular ejection fraction, by estimation, is 70 to 75%. The left ventricle has hyperdynamic function. The left ventricle has no regional wall motion abnormalities. Left ventricular diastolic parameters were normal.  2. Right ventricular systolic function is normal. The right ventricular size is normal. There  is normal pulmonary artery systolic pressure.  3. The mitral valve is normal in structure. Trivial mitral valve regurgitation.  4. The aortic valve is normal in structure. Aortic valve regurgitation is not visualized. FINDINGS  Left Ventricle: Left ventricular ejection fraction, by estimation, is 70 to 75%. The left ventricle has hyperdynamic function. The left ventricle has no regional wall motion abnormalities. The left ventricular internal cavity size was small. There is no  left ventricular hypertrophy. Left ventricular diastolic parameters were normal. Right Ventricle: The right ventricular size is normal. No increase in right ventricular wall thickness. Right ventricular systolic function is normal. There is normal pulmonary artery systolic pressure. The tricuspid regurgitant velocity is 2.27 m/s, and  with an assumed right atrial pressure of 10 mmHg, the estimated right ventricular systolic pressure is 89.2 mmHg. Left Atrium: Left atrial size was normal in size. Right Atrium: Right atrial size was normal in size. Pericardium: There is no evidence of pericardial effusion. Mitral Valve: The mitral valve is normal in structure. Trivial mitral valve regurgitation. MV peak gradient, 3.6 mmHg. The mean mitral valve gradient is 2.0 mmHg. Tricuspid Valve: The tricuspid valve is normal in structure. Tricuspid valve regurgitation is trivial. Aortic Valve: The aortic valve is normal in structure. Aortic valve regurgitation is not visualized. Aortic valve mean gradient measures 3.0 mmHg. Aortic valve peak  gradient measures 5.5 mmHg. Aortic valve area, by VTI measures 3.31 cm. Pulmonic Valve: The pulmonic valve was normal in structure. Pulmonic valve regurgitation is not visualized. Aorta: The aortic root and ascending aorta are structurally normal, with no evidence of dilitation. IAS/Shunts: No atrial level shunt detected by color flow Doppler.  LEFT VENTRICLE PLAX 2D LVIDd:         3.83 cm  Diastology LVIDs:         2.31 cm  LV e' lateral:   7.94 cm/s LV PW:         1.06 cm  LV E/e' lateral: 7.1 LV IVS:        0.86 cm  LV e' medial:    7.18 cm/s LVOT diam:     2.20 cm  LV E/e' medial:  7.8 LV SV:         48 LV SV Index:   25 LVOT Area:     3.80 cm  LEFT ATRIUM             Index LA diam:        2.90 cm 1.50 cm/m LA Vol (A2C):   37.5 ml 19.45 ml/m LA Vol (A4C):   39.8 ml 20.64 ml/m LA Biplane Vol: 42.5 ml 22.05 ml/m  AORTIC VALVE                   PULMONIC VALVE AV Area (Vmax):    2.62 cm    PV Vmax:       1.15 m/s AV Area (Vmean):   3.08 cm    PV Vmean:      65.500 cm/s AV Area (VTI):     3.31 cm    PV VTI:        0.155 m AV Vmax:           117.00 cm/s PV Peak grad:  5.3 mmHg AV Vmean:          76.200 cm/s PV Mean grad:  2.0 mmHg AV VTI:            0.146 m AV Peak Grad:      5.5 mmHg  AV Mean Grad:      3.0 mmHg LVOT Vmax:         80.50 cm/s LVOT Vmean:        61.800 cm/s LVOT VTI:          0.127 m LVOT/AV VTI ratio: 0.87  AORTA Ao Root diam: 3.60 cm MITRAL VALVE               TRICUSPID VALVE MV Area (PHT): 5.66 cm    TR Peak grad:   20.6 mmHg MV Peak grad:  3.6 mmHg    TR Vmax:        227.00 cm/s MV Mean grad:  2.0 mmHg MV Vmax:       0.95 m/s    SHUNTS MV Vmean:      63.9 cm/s   Systemic VTI:  0.13 m MV Decel Time: 134 msec    Systemic Diam: 2.20 cm MV E velocity: 56.30 cm/s MV A velocity: 70.30 cm/s MV E/A ratio:  0.80 Serafina Royals MD Electronically signed by Serafina Royals MD Signature Date/Time: 10/25/2019/1:12:06 PM    Final         Subjective: Patient seen this AM, reports feeling well.  No  difficulty breathing, cough, chest pain, fevers or chills.     Discharge Exam: Vitals:   10/28/19 0946 10/28/19 1140  BP: 138/77 140/86  Pulse: 95 (!) 105  Resp: (!) 24 (!) 23  Temp:    SpO2: 95% 95%   Vitals:   10/28/19 0936 10/28/19 0941 10/28/19 0946 10/28/19 1140  BP:  124/78 138/77 140/86  Pulse: 94 94 95 (!) 105  Resp: (!) 28 20 (!) 24 (!) 23  Temp:      TempSrc:      SpO2: 93% 93% 95% 95%  Weight:      Height:        General: Pt is alert, awake, not in acute distress Cardiovascular: RRR, S1/S2 +, no rubs, no gallops Respiratory: CTA bilaterally with improved inspiration, no wheezing, no rhonchi Abdominal: Soft, NT, ND, bowel sounds + Extremities: trace edema, no cyanosis    The results of significant diagnostics from this hospitalization (including imaging, microbiology, ancillary and laboratory) are listed below for reference.     Microbiology: Recent Results (from the past 240 hour(s))  Culture, blood (Routine x 2)     Status: None   Collection Time: 10/21/19  9:12 PM   Specimen: BLOOD  Result Value Ref Range Status   Specimen Description BLOOD LEFT ANTECUBITAL  Final   Special Requests   Final    BOTTLES DRAWN AEROBIC AND ANAEROBIC Blood Culture adequate volume   Culture   Final    NO GROWTH 5 DAYS Performed at [facility], [address] [place], [place] [address]    Report Status 10/26/2019 FINAL  Final  Culture, blood (Routine x 2)     Status: None   Collection Time: 10/22/19  1:45 AM   Specimen: BLOOD  Result Value Ref Range Status   Specimen Description BLOOD BLOOD LEFT HAND  Final   Special Requests   Final    BOTTLES DRAWN AEROBIC AND ANAEROBIC Blood Culture results Burns not be optimal due to an excessive volume of blood received in culture bottles   Culture   Final    NO GROWTH 5 DAYS Performed at [facility], [address] [place], [place] [address]    Report Status 10/27/2019 FINAL  Final  SARS Coronavirus 2 by  RT  PCR (hospital order, performed in [facility] hospital lab) Nasopharyngeal Nasopharyngeal Swab     Status: None   Collection Time: 10/22/19  2:04 AM   Specimen: Nasopharyngeal Swab  Result Value Ref Range Status   SARS Coronavirus 2 NEGATIVE NEGATIVE Final    Comment: (NOTE) SARS-CoV-2 target nucleic acids are NOT DETECTED.  The SARS-CoV-2 RNA is generally detectable in upper and lower respiratory specimens during the acute phase of infection. The lowest concentration of SARS-CoV-2 viral copies this assay can detect is 250 copies / mL. A negative result does not preclude SARS-CoV-2 infection and should not be used as the sole basis for treatment or other patient management decisions.  A negative result Burns occur with improper specimen collection / handling, submission of specimen other than nasopharyngeal swab, presence of viral mutation(s) within the areas targeted by this assay, and inadequate number of viral copies (<250 copies / mL). A negative result must be combined with clinical observations, patient history, and epidemiological information.  Fact Sheet for Patients:   StrictlyIdeas.no  Fact Sheet for Healthcare Providers: BankingDealers.co.za  This test is not yet approved or  cleared by the [place] FDA and has been authorized for detection and/or diagnosis of SARS-CoV-2 by FDA under an Emergency Use Authorization (EUA).  This EUA will remain in effect (meaning this test can be used) for the duration of the COVID-19 declaration under Section 564(b)(1) of the Act, 21 U.S.C. section 360bbb-3(b)(1), unless the authorization is terminated or revoked sooner.  Performed at [facility], [address] [place], [place] [address]   Urine culture     Status: Abnormal   Collection Time: 10/22/19  8:34 AM   Specimen: Urine, Random  Result Value Ref Range Status   Specimen Description   Final    URINE,  RANDOM Performed at [facility], [address], [place], [place] [address]    Special Requests   Final    NONE Performed at [facility], [address] [place], [place] [address]    Culture (A)  Final    10,000 COLONIES/mL MULTIPLE SPECIES PRESENT, SUGGEST RECOLLECTION   Report Status 10/23/2019 FINAL  Final  MRSA PCR Screening     Status: None   Collection Time: 10/24/19  5:52 PM   Specimen: Nasal Mucosa; Nasopharyngeal  Result Value Ref Range Status   MRSA by PCR NEGATIVE NEGATIVE Final    Comment:        The GeneXpert MRSA Assay (FDA approved for NASAL specimens only), is one component of a comprehensive MRSA colonization surveillance program. It is not intended to diagnose MRSA infection nor to guide or monitor treatment for MRSA infections. Performed at [facility], [address] [place], [place] [address]      Labs: BNP (last 3 results) Recent Labs    10/21/19 2112 10/28/19 0614  BNP 46.4 94.8   Basic Metabolic Panel: Recent Labs  Lab 10/21/19 2112 10/22/19 0757 10/23/19 0222 10/24/19 0543 10/25/19 0511 10/26/19 0616 10/27/19 0447  NA   < >  --  130* 136 133* 135 136  K   < >  --  3.9 3.8 3.9 3.4* 3.8  CL   < >  --  98 102 101 100 100  CO2   < >  --  26 27 26 31 30   GLUCOSE   < >  --  126* 126* 122* 123* 109*  BUN   < >  --  15 15 20 14  13  CREATININE   < >  --  0.72 0.81 0.75 0.61 0.60  CALCIUM   < >  --  8.3* 8.7* 8.7* 8.4* 8.6*  MG  --  2.1  --   --   --   --  1.9  PHOS  --  4.0  --   --   --   --   --    < > = values in this interval not displayed.   Liver Function Tests: Recent Labs  Lab 10/21/19 2112 10/24/19 0543  AST 24 30  ALT 19 17  ALKPHOS 59 47  BILITOT 0.9 0.6  PROT 7.3 7.0  ALBUMIN 3.9 2.8*   No results for input(s): LIPASE, AMYLASE in the last 168 hours. No results for input(s): AMMONIA in the last 168 hours. CBC: Recent Labs  Lab 10/21/19 2112 10/23/19 0222 10/24/19 0543  10/25/19 0511 10/26/19 0616  WBC 13.8* 8.7 7.3 7.8 6.3  NEUTROABS 11.9*  --   --   --   --   HGB 14.6 12.0 12.0 11.7* 12.2  HCT 44.3 36.8 37.2 35.9* 37.3  MCV 91.9 93.6 93.5 92.1 93.3  PLT 150 129* 154 150 143*   Cardiac Enzymes: No results for input(s): CKTOTAL, CKMB, CKMBINDEX, TROPONINI in the last 168 hours. BNP: Invalid input(s): POCBNP CBG: Recent Labs  Lab 10/24/19 0835  GLUCAP 153*   D-Dimer No results for input(s): DDIMER in the last 72 hours. Hgb A1c No results for input(s): HGBA1C in the last 72 hours. Lipid Profile No results for input(s): CHOL, HDL, LDLCALC, TRIG, CHOLHDL, LDLDIRECT in the last 72 hours. Thyroid function studies No results for input(s): TSH, T4TOTAL, T3FREE, THYROIDAB in the last 72 hours.  Invalid input(s): FREET3 Anemia work up No results for input(s): VITAMINB12, FOLATE, FERRITIN, TIBC, IRON, RETICCTPCT in the last 72 hours. Urinalysis    Component Value Date/Time   COLORURINE AMBER (A) 10/22/2019 0834   APPEARANCEUR CLOUDY (A) 10/22/2019 0834   APPEARANCEUR Cloudy (A) 09/07/2019 1151   LABSPEC 1.040 (H) 10/22/2019 0834   LABSPEC 1.019 04/25/2011 0854   PHURINE 5.0 10/22/2019 0834   GLUCOSEU NEGATIVE 10/22/2019 0834   GLUCOSEU NEGATIVE 02/13/2016 1109   HGBUR NEGATIVE 10/22/2019 0834   BILIRUBINUR NEGATIVE 10/22/2019 0834   BILIRUBINUR neg 02/07/2015 1520   BILIRUBINUR Negative 04/25/2011 0854   KETONESUR NEGATIVE 10/22/2019 0834   PROTEINUR >=300 (A) 10/22/2019 0834   UROBILINOGEN 0.2 02/13/2016 1109   NITRITE NEGATIVE 10/22/2019 0834   LEUKOCYTESUR TRACE (A) 10/22/2019 0834   LEUKOCYTESUR 2+ 04/25/2011 0854   Sepsis Labs Invalid input(s): PROCALCITONIN,  WBC,  LACTICIDVEN Microbiology Recent Results (from the past 240 hour(s))  Culture, blood (Routine x 2)     Status: None   Collection Time: 10/21/19  9:12 PM   Specimen: BLOOD  Result Value Ref Range Status   Specimen Description BLOOD LEFT ANTECUBITAL  Final   Special  Requests   Final    BOTTLES DRAWN AEROBIC AND ANAEROBIC Blood Culture adequate volume   Culture   Final    NO GROWTH 5 DAYS Performed at [facility], [address] [place],  [address]    Report Status 10/26/2019 FINAL  Final  Culture, blood (Routine x 2)     Status: None   Collection Time: 10/22/19  1:45 AM   Specimen: BLOOD  Result Value Ref Range Status   Specimen Description BLOOD BLOOD LEFT HAND  Final   Special Requests   Final    BOTTLES DRAWN  AEROBIC AND ANAEROBIC Blood Culture results Burns not be optimal due to an excessive volume of blood received in culture bottles   Culture   Final    NO GROWTH 5 DAYS Performed at [facility], [address] [place], [place] [address]    Report Status 10/27/2019 FINAL  Final  SARS Coronavirus 2 by RT PCR (hospital order, performed in [facility] hospital lab) Nasopharyngeal Nasopharyngeal Swab     Status: None   Collection Time: 10/22/19  2:04 AM   Specimen: Nasopharyngeal Swab  Result Value Ref Range Status   SARS Coronavirus 2 NEGATIVE NEGATIVE Final    Comment: (NOTE) SARS-CoV-2 target nucleic acids are NOT DETECTED.  The SARS-CoV-2 RNA is generally detectable in upper and lower respiratory specimens during the acute phase of infection. The lowest concentration of SARS-CoV-2 viral copies this assay can detect is 250 copies / mL. A negative result does not preclude SARS-CoV-2 infection and should not be used as the sole basis for treatment or other patient management decisions.  A negative result Burns occur with improper specimen collection / handling, submission of specimen other than nasopharyngeal swab, presence of viral mutation(s) within the areas targeted by this assay, and inadequate number of viral copies (<250 copies / mL). A negative result must be combined with clinical observations, patient history, and epidemiological information.  Fact Sheet for Patients:    StrictlyIdeas.no  Fact Sheet for Healthcare Providers: BankingDealers.co.za  This test is not yet approved or  cleared by the [place] FDA and has been authorized for detection and/or diagnosis of SARS-CoV-2 by FDA under an Emergency Use Authorization (EUA).  This EUA will remain in effect (meaning this test can be used) for the duration of the COVID-19 declaration under Section 564(b)(1) of the Act, 21 U.S.C. section 360bbb-3(b)(1), unless the authorization is terminated or revoked sooner.  Performed at [facility], [address] [place], [place] [address]   Urine culture     Status: Abnormal   Collection Time: 10/22/19  8:34 AM   Specimen: Urine, Random  Result Value Ref Range Status   Specimen Description   Final    URINE, RANDOM Performed at [facility], [address], [place], [place] [address]    Special Requests   Final    NONE Performed at [facility], [address] [place], [place] [address]    Culture (A)  Final    10,000 COLONIES/mL MULTIPLE SPECIES PRESENT, SUGGEST RECOLLECTION   Report Status 10/23/2019 FINAL  Final  MRSA PCR Screening     Status: None   Collection Time: 10/24/19  5:52 PM   Specimen: Nasal Mucosa; Nasopharyngeal  Result Value Ref Range Status   MRSA by PCR NEGATIVE NEGATIVE Final    Comment:        The GeneXpert MRSA Assay (FDA approved for NASAL specimens only), is one component of a comprehensive MRSA colonization surveillance program. It is not intended to diagnose MRSA infection nor to guide or monitor treatment for MRSA infections. Performed at [facility], [address] [place], [place] [address]      Time coordinating discharge: Over 30 minutes  SIGNED:   Ezekiel Slocumb, DO [facility] 10/28/2019, 12:34 PM   If 7PM-7AM, please contact night-coverage [email or web address]

## 2019-10-28 NOTE — Progress Notes (Signed)
Occupational Therapy Treatment Patient Details Name: Madison Burns MRN: [id number] DOB: Jul 09, 1950 Today's Date: 10/28/2019    History of present illness Pt is a 69 y.o. female presenting to hospital 9/4 with R sided chest pain and SOB.  Pt admitted with sepsis d/t community acquired PNA L lower lobe, myasthenia gravis with possible flare, and new onset a-fib with RVR.  Rapid response 9/6 d/t chest pain.  PMH includes htn, OSA, IBS, myasthenia gravis, thymoma, s/p radiation therapy 4 months ago; renal CA, h/o seizure disorder.   OT comments  Pt seen for OT tx focused on safety with toileting for activity tolerance and cardiopulmonary during ADL. Pt performed bed mobility with supervision and performed toileting transfers with CGA and use of RW with cues for mgt. Mod assist for pericare. Pt tolerated well, denied SOB throughout. VSS and O2 >93% throughout on room air. Pt continues to benefit from skilled OT services. Continue to recommend HHOT and 24/7 supervision/assist pending further stability/progress with cardiopulmonary status.    Follow Up Recommendations  Home health OT;Supervision/Assistance - 24 hour    Equipment Recommendations       Recommendations for Other Services      Precautions / Restrictions Precautions Precautions: Fall Precaution Comments: Keep O2 >92% Restrictions Weight Bearing Restrictions: No       Mobility Bed Mobility Overal bed mobility: Needs Assistance Bed Mobility: Supine to Sit;Sit to Supine     Supine to sit: Supervision;HOB elevated Sit to supine: Supervision   General bed mobility comments: additional processing time after cue to initiate, but no assist required  Transfers Overall transfer level: Needs assistance Equipment used: Rolling walker (2 wheeled) Transfers: Sit to/from [facility] Sit to Stand: Min guard Stand pivot transfers: Min guard       General transfer comment: CGA for safety, cues for RW mgt     Balance Overall balance assessment: Mild deficits observed, not formally tested                                         ADL either performed or assessed with clinical judgement   ADL Overall ADL's : Needs assistance/impaired                         Toilet Transfer: Min Forensic psychologist Details (indicate cue type and reason): cues for sequencing/RW mgt, additional processing time for sequencing Toileting- Clothing Manipulation and Hygiene: Moderate assistance;Sit to/from stand Toileting - Clothing Manipulation Details (indicate cue type and reason): Mod A for pericare with pt's BUE support on RW             Vision       Perception     Praxis      Cognition Arousal/Alertness: Awake/alert Behavior During Therapy: Flat affect Overall Cognitive Status: No family/caregiver present to determine baseline cognitive functioning                                 General Comments: pt alert, pleasant, follows commands with increased processing time        Exercises Other Exercises Other Exercises: toileting via BSC requiring CGA for safety with transfers using RW, cues for RW mgt; mod assist for pericare after BM   Shoulder Instructions       General Comments RN cleared OT  to participate in therapy; on room air, VSS with O2 >93% throughout, HR up to 110's with exertion. Pt denied SOB throughout.    Pertinent Vitals/ Pain       Pain Assessment: No/denies pain  Home Living                                          Prior Functioning/Environment              Frequency  Min 1X/week        Progress Toward Goals  OT Goals(current goals can now be found in the care plan section)  Progress towards OT goals: Progressing toward goals  Acute Rehab OT Goals Patient Stated Goal: get better and go home with sister OT Goal Formulation: With patient Time For Goal Achievement: 11/08/19 Potential to  Achieve Goals: Good  Plan Discharge plan remains appropriate;Frequency remains appropriate    Co-evaluation                 AM-PAC OT "6 Clicks" Daily Activity     Outcome Measure   Help from another person eating meals?: None Help from another person taking care of personal grooming?: None Help from another person toileting, which includes using toliet, bedpan, or urinal?: A Little Help from another person bathing (including washing, rinsing, drying)?: A Little Help from another person to put on and taking off regular upper body clothing?: None Help from another person to put on and taking off regular lower body clothing?: A Little 6 Click Score: 21    End of Session Equipment Utilized During Treatment: Gait belt;Rolling walker  OT Visit Diagnosis: Other abnormalities of gait and mobility (R26.89);Muscle weakness (generalized) (M62.81)   Activity Tolerance Patient tolerated treatment well   Patient Left in bed;with call bell/phone within reach;with bed alarm set   Nurse Communication Other (comment) (nurse tech notified of BM, pt tolerated well on room air)        Time: 1005-1029 OT Time Calculation (min): 24 min  Charges: OT General Charges $OT Visit: 1 Visit OT Treatments $Self Care/Home Management : 23-37 mins  Jeni Salles, MPH, MS, OTR/L ascom [phone] 10/28/19, 11:37 AM

## 2019-10-28 NOTE — TOC Transition Note (Signed)
Transition of Care [facility]) - CM/SW Discharge Note   Patient Details  Name: Madison Burns MRN: [id number] Date of Birth: 08-16-1950  [facility]) CM/SW Contact:  Victorino Dike, RN Phone Number: 10/28/2019, 12:40 PM      Final next level of care: [place] Barriers to Discharge: No Barriers Identified   Patient Goals and CMS Choice        Discharge Placement                       Discharge Plan and Services                          HH Arranged: RN, PT, OT [facility] Agency: [facility] Date [place]: 10/27/19 Time [place]: 60 Representative spoke with at [place]: [place] ([place]) Interventions     Readmission Risk Interventions No flowsheet data found.

## 2019-10-31 ENCOUNTER — Telehealth: Payer: Self-pay | Admitting: Internal Medicine

## 2019-10-31 ENCOUNTER — Telehealth: Payer: Self-pay | Admitting: Family Medicine

## 2019-10-31 ENCOUNTER — Telehealth: Payer: Self-pay

## 2019-10-31 NOTE — Telephone Encounter (Signed)
Transition Care Management Follow-up Telephone Call  Date of discharge and from where: [facility] on 10/28/19  How have you been since you were released from the hospital? Still winded and SOB when she gets up and moves around. Pt has been sad and tends to cry a lot due to labored breathing. Pt has had trouble sleeping when first d/c home but now she is in the recliner and sleeps better. Pt's appetite is fine but energy level is low. Declines pain, dizziness, fever, n/v/d.   Any questions or concerns? Sister states she has a lot of concerns due to the labored breathing and pt getting upset due to this. Sister states that she has no way of checking her oxygen level. Recommend getting a pulse oximeter from the pharmacy to check O2 stats.   Items Reviewed:  Did the pt receive and understand the discharge instructions provided? Yes   Medications obtained and verified? No, sister declined reviewing at this time. Did verify new medication (Cardizem 180 mg once a day)  Any new allergies since your discharge? No   Dietary orders reviewed? Yes  Do you have support at home? Yes   Other (ie: DME, Home Health, etc): HH was ordered and has reached out to patient.  Functional Questionnaire: (I = Independent and D = Dependent)  Bathing/Dressing- D, sister bathes pt.   Meal Prep- I  Eating- I  Maintaining continence- I  Transferring/Ambulation- I  Managing Meds- D, sister manages medications.   Follow up appointments reviewed:    PCP Hospital f/u appt confirmed? Yes  scheduled to see Dr Nicki Reaper on 11/10/19 @ 2:00 PM.  [facility] f/u appt confirmed? Yes    Are transportation arrangements needed? No   If their condition worsens, is the pt aware to call  their PCP or go to the ED? Yes  Was the patient provided with contact information for the PCP's office or ED? Yes  Was the pt encouraged to call back with questions or concerns? Yes

## 2019-10-31 NOTE — Telephone Encounter (Signed)
Pt sister called to schedule a HFU appt on 9/23 Virtual Visit.. pt has Sepsis

## 2019-10-31 NOTE — Telephone Encounter (Signed)
Does she need TCM? Please confirm doing ok, etc.

## 2019-10-31 NOTE — Telephone Encounter (Signed)
-  I called sister, [name].  Asking about pt and methotrexate for MG back in  08-2019 .  She was doing well back when in .  Has had chemo/ radiation/ freezing of tumor/ frozen.  Seen in  [facility] new diag Afib, sepsis/pneumonia.  MG worsening ? Start back on methotrexate.  Please advise.  Next appt with Dr. Brett Fairy  03-08-2020.

## 2019-10-31 NOTE — Telephone Encounter (Signed)
Can you have Madison Burns update me on progress with cancer treatments? Is she completely finished with chemo/radiation? Any plans to restart? Let her know I am discussing this case with Dr Brett Fairy and will get a good plan in place to continue until follow up.

## 2019-10-31 NOTE — Telephone Encounter (Signed)
Pt's sister [name] on [place] called wanting to know if the pt is to start back on her methotrexate. Please advise.

## 2019-11-01 NOTE — Telephone Encounter (Signed)
Hey Dr Brett Fairy. Would you want to restart methotrexate now that chemo/radiation is completed? She was admitted for PNA with sepsis at beginning of the month. Has continued mestinon but methotrexate stopped due to chemo. Thank you for your help!

## 2019-11-01 NOTE — Telephone Encounter (Signed)
TCM previously completed by off site Nurse Health Advisor. See notes for transition of care.

## 2019-11-01 NOTE — Telephone Encounter (Signed)
I would wait until November , 6 month post chemotherapy , before restarting, especially with recent sepsis. CD

## 2019-11-01 NOTE — Telephone Encounter (Signed)
Called and spoke to Madison Burns's sister, Madison Burns. She states that Madison Burns is doing better but is still breathing hard when walking to the bathroom. A physical therapist is scheduled to come in on Wednesday and home health has been set up and is scheduled to come to the home on Thursday. She will let the Cardiologist know.

## 2019-11-01 NOTE — Telephone Encounter (Signed)
Agree with obtaining a pulse ox.  Also, per note - is home health set up for pt.  This would allow nurse observation,etc.  Per note, it states pt is resting better in recliner, etc.  Is she doing better?  What are specific symptoms now?  Also need to let cardiology know about her sob as well.  She sees Dr Ubaldo Glassing.

## 2019-11-01 NOTE — Telephone Encounter (Signed)
Called Nancy back and no plans to restart chemo/radiation.  Chemo/radiation finished 07-08-19 and frozen resection done end of 07/2019.

## 2019-11-02 DIAGNOSIS — I10 Essential (primary) hypertension: Secondary | ICD-10-CM | POA: Diagnosis not present

## 2019-11-02 DIAGNOSIS — G7000 Myasthenia gravis without (acute) exacerbation: Secondary | ICD-10-CM | POA: Diagnosis not present

## 2019-11-02 DIAGNOSIS — Z6839 Body mass index (BMI) 39.0-39.9, adult: Secondary | ICD-10-CM | POA: Diagnosis not present

## 2019-11-02 DIAGNOSIS — K589 Irritable bowel syndrome without diarrhea: Secondary | ICD-10-CM | POA: Diagnosis not present

## 2019-11-02 DIAGNOSIS — G40909 Epilepsy, unspecified, not intractable, without status epilepticus: Secondary | ICD-10-CM | POA: Diagnosis not present

## 2019-11-02 DIAGNOSIS — E669 Obesity, unspecified: Secondary | ICD-10-CM | POA: Diagnosis not present

## 2019-11-02 DIAGNOSIS — Z8701 Personal history of pneumonia (recurrent): Secondary | ICD-10-CM | POA: Diagnosis not present

## 2019-11-02 DIAGNOSIS — F329 Major depressive disorder, single episode, unspecified: Secondary | ICD-10-CM | POA: Diagnosis not present

## 2019-11-02 DIAGNOSIS — Z85528 Personal history of other malignant neoplasm of kidney: Secondary | ICD-10-CM | POA: Diagnosis not present

## 2019-11-02 DIAGNOSIS — C37 Malignant neoplasm of thymus: Secondary | ICD-10-CM | POA: Diagnosis not present

## 2019-11-02 DIAGNOSIS — E039 Hypothyroidism, unspecified: Secondary | ICD-10-CM | POA: Diagnosis not present

## 2019-11-02 DIAGNOSIS — K219 Gastro-esophageal reflux disease without esophagitis: Secondary | ICD-10-CM | POA: Diagnosis not present

## 2019-11-02 DIAGNOSIS — I4891 Unspecified atrial fibrillation: Secondary | ICD-10-CM | POA: Diagnosis not present

## 2019-11-02 DIAGNOSIS — E785 Hyperlipidemia, unspecified: Secondary | ICD-10-CM | POA: Diagnosis not present

## 2019-11-02 DIAGNOSIS — G4733 Obstructive sleep apnea (adult) (pediatric): Secondary | ICD-10-CM | POA: Diagnosis not present

## 2019-11-02 NOTE — Telephone Encounter (Signed)
Noted. Patients sister Aleatha Borer stated that they were doing fine yesterday and doesn't need anything.

## 2019-11-02 NOTE — Telephone Encounter (Signed)
Can you please let patient and her sister know that Dr Brett Fairy recommends waiting at least 6 months following last chemo/radiation. She also noted we should wait due to recent sepsis. We do not want to lower immune response any more. She has scheduled follow up with Dr Brett Fairy in January? We could try to move that up if anything available with Dr Dohmeier?

## 2019-11-02 NOTE — Telephone Encounter (Signed)
Thank you for the update.  She has a f/u appt scheduled with me.  PT coming today and home health tomorrow.  I am glad she is feeling better.  Does she feel needs anything more at this time.

## 2019-11-02 NOTE — Telephone Encounter (Signed)
Spoke to [employer], sister of pt.  Relayed that AL/NP spoke to Dr. Brett Fairy and receommend waiting 6 months s/p radiation and chemo as well has recent treatment for sepsis.  Able to move appt appt to 12-27-19 at 1330 w/ Dr. Brett Fairy.  [name] verbalized understanding and was appreciative.

## 2019-11-03 ENCOUNTER — Telehealth: Payer: Self-pay | Admitting: Internal Medicine

## 2019-11-03 NOTE — Telephone Encounter (Signed)
[place] from [place] ,[phone]. Verbal orders for OT,1x for1 week,2x for 2 weeks,1x for 1week

## 2019-11-04 ENCOUNTER — Encounter: Payer: Self-pay | Admitting: Emergency Medicine

## 2019-11-04 ENCOUNTER — Emergency Department: Payer: PPO

## 2019-11-04 ENCOUNTER — Emergency Department
Admission: EM | Admit: 2019-11-04 | Discharge: 2019-11-04 | Disposition: A | Discharge status: Home / Self Care | Payer: PPO | Attending: Emergency Medicine | Admitting: Emergency Medicine

## 2019-11-04 ENCOUNTER — Other Ambulatory Visit: Payer: Self-pay

## 2019-11-04 DIAGNOSIS — E039 Hypothyroidism, unspecified: Secondary | ICD-10-CM | POA: Insufficient documentation

## 2019-11-04 DIAGNOSIS — R0602 Shortness of breath: Secondary | ICD-10-CM

## 2019-11-04 DIAGNOSIS — R0600 Dyspnea, unspecified: Secondary | ICD-10-CM | POA: Diagnosis not present

## 2019-11-04 DIAGNOSIS — I10 Essential (primary) hypertension: Secondary | ICD-10-CM | POA: Insufficient documentation

## 2019-11-04 DIAGNOSIS — Z20822 Contact with and (suspected) exposure to covid-19: Secondary | ICD-10-CM | POA: Insufficient documentation

## 2019-11-04 DIAGNOSIS — M160 Bilateral primary osteoarthritis of hip: Secondary | ICD-10-CM | POA: Diagnosis not present

## 2019-11-04 DIAGNOSIS — G4700 Insomnia, unspecified: Secondary | ICD-10-CM | POA: Diagnosis not present

## 2019-11-04 DIAGNOSIS — F79 Unspecified intellectual disabilities: Secondary | ICD-10-CM | POA: Diagnosis not present

## 2019-11-04 DIAGNOSIS — Z85520 Personal history of malignant carcinoid tumor of kidney: Secondary | ICD-10-CM | POA: Diagnosis not present

## 2019-11-04 DIAGNOSIS — Z79899 Other long term (current) drug therapy: Secondary | ICD-10-CM | POA: Diagnosis not present

## 2019-11-04 DIAGNOSIS — Z79890 Hormone replacement therapy (postmenopausal): Secondary | ICD-10-CM | POA: Diagnosis not present

## 2019-11-04 DIAGNOSIS — F39 Unspecified mood [affective] disorder: Secondary | ICD-10-CM | POA: Diagnosis not present

## 2019-11-04 DIAGNOSIS — K6389 Other specified diseases of intestine: Secondary | ICD-10-CM | POA: Diagnosis not present

## 2019-11-04 DIAGNOSIS — R140 Abdominal distension (gaseous): Secondary | ICD-10-CM | POA: Diagnosis not present

## 2019-11-04 DIAGNOSIS — J90 Pleural effusion, not elsewhere classified: Secondary | ICD-10-CM | POA: Diagnosis not present

## 2019-11-04 DIAGNOSIS — J189 Pneumonia, unspecified organism: Secondary | ICD-10-CM | POA: Diagnosis not present

## 2019-11-04 DIAGNOSIS — I4891 Unspecified atrial fibrillation: Secondary | ICD-10-CM | POA: Insufficient documentation

## 2019-11-04 DIAGNOSIS — I878 Other specified disorders of veins: Secondary | ICD-10-CM | POA: Diagnosis not present

## 2019-11-04 HISTORY — DX: Malignant neoplasm of thymus: C37

## 2019-11-04 LAB — SARS CORONAVIRUS 2 BY RT PCR (HOSPITAL ORDER, PERFORMED IN ~~LOC~~ HOSPITAL LAB): SARS Coronavirus 2: NEGATIVE

## 2019-11-04 LAB — BASIC METABOLIC PANEL
Anion gap: 11 (ref 5–15)
BUN: 21 mg/dL (ref 8–23)
CO2: 26 mmol/L (ref 22–32)
Calcium: 8.8 mg/dL — ABNORMAL LOW (ref 8.9–10.3)
Chloride: 96 mmol/L — ABNORMAL LOW (ref 98–111)
Creatinine, Ser: 0.99 mg/dL (ref 0.44–1.00)
GFR calc Af Amer: >60 mL/min (ref >60)
GFR calc non Af Amer: 59 mL/min — ABNORMAL LOW (ref >60)
Glucose, Bld: 213 mg/dL — ABNORMAL HIGH (ref 70–99)
Potassium: 3.5 mmol/L (ref 3.5–5.1)
Sodium: 133 mmol/L — ABNORMAL LOW (ref 135–145)

## 2019-11-04 LAB — CBC
HCT: 43.1 % (ref 36.0–46.0)
Hemoglobin: 13.6 g/dL (ref 12.0–15.0)
MCH: 29.6 pg (ref 26.0–34.0)
MCHC: 31.6 g/dL (ref 30.0–36.0)
MCV: 93.9 fL (ref 80.0–100.0)
Platelets: 238 10*3/uL (ref 150–400)
RBC: 4.59 MIL/uL (ref 3.87–5.11)
RDW: 14 % (ref 11.5–15.5)
WBC: 10.9 10*3/uL — ABNORMAL HIGH (ref 4.0–10.5)
nRBC: 0 % (ref 0.0–0.2)

## 2019-11-04 LAB — TROPONIN I (HIGH SENSITIVITY)
Troponin I (High Sensitivity): 12 ng/L (ref <18)
Troponin I (High Sensitivity): 13 ng/L (ref <18)

## 2019-11-04 NOTE — ED Provider Notes (Signed)
[facility] Emergency Department Provider Note  Time seen: 9:57 PM  I have reviewed the triage vital signs and the nursing notes.   HISTORY  Chief Complaint Shortness of Breath   HPI Madison Burns is a 69 y.o. female with a past medical history of myasthenia gravis, gastric reflux, hypertension, hyperlipidemia, OSA on CPAP at night, recent thymoma status post chemoradiation, presents to the emergency department with shortness of breath.  According to family and record review patient was discharged to the hospital approximately 1 week ago after a stay for shortness of breath thought to be due to left-sided pneumonia and possible myasthenia gravis exacerbation.  Patient underwent treatment with antibiotics and IVIG was ultimately discharged 1 week ago.  Since going home patient has been doing fairly well but gets short of breath with exertion per family.  Patient has been feeling sad lately and has been crying at times, family brought her to see her psychiatrist today, and she recommended she go get evaluated for the shortness of breath which is what brought him to the emergency department tonight.   Currently patient appears well, lying in bed will occasionally answer questions but for the most part family answers questions for the patient is patient special needs.  Per patient and family they deny any recent fever, states the cough has resolved.  Patient is vaccinated against Covid.  Past Medical History:  Diagnosis Date  . Cancer of kidney ([place]) 2021  . Complication of anesthesia    Myasthenia gravis  . GERD (gastroesophageal reflux disease)   . History of seizure disorder   . Hypercholesterolemia   . Hypertension    no longer has it. wt loss  . Hypothyroidism   . Irritable bowel syndrome   . Myasthenia gravis ([place]) 2011  . OSA on CPAP   . Skin cancer 2017   Melanoma on neck  . Thymoma, malignant [facility])     Patient Active Problem List   Diagnosis Date Noted   . Acute respiratory failure with hypoxia ([place])   . Palliative care by specialist   . Acute respiratory distress   . Pressure injury of skin 10/24/2019  . Atrial fibrillation with RVR ([place]) 10/23/2019  . Community acquired pneumonia 10/23/2019  . Acute respiratory failure with hypoxia and hypercapnia (HCC)   . Sepsis ([place]) 10/22/2019  . Hypothyroidism   . Left renal mass 08/17/2019  . Left shoulder pain 07/18/2019  . Mediastinal mass 04/11/2019  . Renal cell cancer, left ([place]) 03/09/2019  . Thymoma 03/09/2019  . Chest pain 12/05/2018  . Fullness of breast 11/30/2018  . History of seizure disorder 01/05/2018  . Gastroesophagitis 07/02/2017  . Cough 04/25/2017  . Epilepsy, generalized, convulsive ([place]) 02/23/2017  . OSA (obstructive sleep apnea) 02/23/2017  . Mood disorder ([place]) 10/01/2016  . Pain in right toe(s) 06/17/2015  . MG (myasthenia gravis) ([place]) 06/06/2015  . OSA on CPAP 06/06/2015  . Dysuria 02/12/2015  . Goals of care, counseling/discussion 06/12/2014  . Obstructive sleep apnea syndrome 05/03/2014  . MG with exacerbation (myasthenia gravis) ([place]) 04/10/2014  . Hypersomnia with sleep apnea 04/10/2014  . Obesity (BMI 30-39.9) 01/29/2014  . Hyperglycemia 01/29/2014  . Abdominal pain 01/29/2014  . Melanoma ([place]) 01/23/2013  . Hypercholesterolemia 12/21/2011  . Hypertension 12/21/2011  . Myasthenia gravis ([place]) 12/21/2011  . Seizure disorder ([place]) 12/21/2011  . Irritable bowel 12/21/2011    Past Surgical History:  Procedure Laterality Date  . ABDOMINAL HYSTERECTOMY  1990  . APPENDECTOMY  1999  .  CHOLECYSTECTOMY  2013  . COLONOSCOPY WITH PROPOFOL N/A 06/30/2017   Procedure: COLONOSCOPY WITH PROPOFOL;  Surgeon: Toledo, Benay Pike, MD;  Location: [facility] ENDOSCOPY;  Service: Gastroenterology;  Laterality: N/A;  . CYSTOSCOPY W/ URETERAL STENT PLACEMENT Left 08/17/2019   Procedure: CYSTOSCOPY WITH RETROGRADE PYELOGRAM/URETERAL STENT PLACEMENT;  Surgeon: Alexis Frock,  MD;  Location: [facility] ORS;  Service: Urology;  Laterality: Left;  30 MINS  . ESOPHAGOGASTRODUODENOSCOPY (EGD) WITH PROPOFOL N/A 06/30/2017   Procedure: ESOPHAGOGASTRODUODENOSCOPY (EGD) WITH PROPOFOL;  Surgeon: Toledo, Benay Pike, MD;  Location: [facility] ENDOSCOPY;  Service: Gastroenterology;  Laterality: N/A;  . IR RADIOLOGIST EVAL & MGMT  06/28/2019  . IR RADIOLOGIST EVAL & MGMT  09/14/2019  . RADIOFREQUENCY ABLATION Left 08/17/2019   Procedure: LEFT RENAL CRYO ABLATION;  Surgeon: Jacqulynn Cadet, MD;  Location: [facility] ORS;  Service: Anesthesiology;  Laterality: Left;  . TONSILLECTOMY  1959    Prior to Admission medications   Medication Sig Start Date End Date Taking? Authorizing Provider  Ascorbic Acid (VITAMIN C) 1000 MG tablet Take 1,000 mg by mouth at bedtime.    [provider]  B Complex-C (B-COMPLEX WITH VITAMIN C) tablet Take 1 tablet by mouth every evening.    [provider]  benzonatate (TESSALON) 100 MG capsule Take 1 capsule (100 mg total) by mouth in the morning and at bedtime. MORNING & 1430    [provider]  Biotin 10000 MCG TABS Take 10,000 mcg by mouth daily at 2 PM. (1430)    [provider]  Calcium-Phosphorus-Vitamin D (CITRACAL +D3 PO) Take 1 tablet by mouth daily.    [provider]  diltiazem (CARDIZEM CD) 180 MG 24 hr capsule Take 1 capsule (180 mg total) by mouth daily. 10/29/19   Ezekiel Slocumb, DO  DULoxetine (CYMBALTA) 30 MG capsule Take 1 capsule (30 mg total) by mouth daily. 09/07/19   Earlie Server, MD  Garlic 7026 MG CAPS Take 1,000 mg by mouth in the morning, at noon, and at bedtime. (0800, 1430, 2000)    [provider]  ketoconazole (NIZORAL) 2 % cream SMARTSIG:1 Application Topical 1 to 2 Times Daily 09/27/19   [provider]  levothyroxine (SYNTHROID) 75 MCG tablet TAKE 1 TABLET BY MOUTH DAILY ON AN EMPTYSTOMACH. WAIT [place] OTHER MEDS Patient taking differently: Take 75 mcg by mouth daily  before breakfast.  05/21/19   Einar Pheasant, MD  pantoprazole (PROTONIX) 40 MG tablet Take 40 mg by mouth daily.  11/24/17   [provider]  Probiotic Product (PROBIOTIC MULTI-ENZYME PO) Take 1 capsule by mouth in the morning and at bedtime. Probiotic Multi-Enzyme Digestive Formula    [provider]  pyridostigmine (MESTINON) 60 MG tablet TAKE 1 TABLET BY MOUTH AT  8 AM, ONE AT LUNCH AND ONE AT 8PM 08/23/19   Dohmeier, Asencion Partridge, MD  risperiDONE (RISPERDAL) 2 MG tablet Take 2 mg by mouth at bedtime.  07/08/18   [provider]  sucralfate (CARAFATE) 1 g tablet Take 1 g by mouth 2 (two) times daily. (0800 & 1700) 05/25/17   [provider]  tolterodine (DETROL LA) 4 MG 24 hr capsule TAKE 1 CAPSULE BY MOUTH ONCE EVERY MORNING Patient taking differently: Take 4 mg by mouth daily.  07/06/19   Einar Pheasant, MD  vitamin E 180 MG (400 UNITS) capsule Take 400 Units by mouth daily at 2 PM. 1430    [provider]    No Known Allergies  Family History  Problem Relation  Age of Onset  . Colon cancer Maternal Grandfather        stomach/colon  . Heart disease Father        myocardial infarction  . Hyperlipidemia Father   . Hyperlipidemia Sister   . Diabetes Sister   . Bone cancer Other        cousin  . Alzheimer's disease Mother        maternal aunts  . Skin cancer Mother   . Myasthenia gravis Brother        ocular  . Skin cancer Brother   . Stroke Paternal Grandfather   . Breast cancer Neg Hx     Social History Social History   Tobacco Use  . Smoking status: Never Smoker  . Smokeless tobacco: Never Used  Vaping Use  . Vaping Use: Never used  Substance Use Topics  . Alcohol use: No    Alcohol/week: 0.0 standard drinks  . Drug use: No    Review of Systems per patient and family: Constitutional: Negative for fever. Cardiovascular: Negative for chest pain. Respiratory: Positive for shortness of breath mostly with exertion. Gastrointestinal:  Negative for abdominal pain, vomiting Musculoskeletal: Negative for musculoskeletal complaints All other ROS negative  ____________________________________________   PHYSICAL EXAM:  VITAL SIGNS: ED Triage Vitals  Enc Vitals Group     BP 11/04/19 1719 (!) 132/91     Pulse Rate 11/04/19 1719 (!) 107     Resp 11/04/19 1719 16     Temp 11/04/19 1719 (!) 97.3 F (36.3 C)     Temp Source 11/04/19 1719 Oral     SpO2 11/04/19 1719 94 %     Weight 11/04/19 1720 213 lb 10 oz (96.9 kg)     Height 11/04/19 1933 5\' 2"  (1.575 m)     Head Circumference --      Peak Flow --      Pain Score --      Pain Loc --      Pain Edu? --      Excl. in [place]? --     Constitutional: Patient awake and alert, lying in bed, no distress. Eyes: Normal exam ENT      Head: Normocephalic and atraumatic.      Mouth/Throat: Mucous membranes are moist. Cardiovascular: Normal rate, regular rhythm.  Respiratory: Patient with slight tachypnea around 22 to 25 breaths/min.  Breath sounds however are clear without wheeze rales or rhonchi.  Does not appear to be in any respiratory distress. Gastrointestinal: Soft and nontender. No distention.   Musculoskeletal: Nontender with normal range of motion in all extremities Neurologic:  Normal speech and language. No Sk[place] focal neurologic deficits  Skin:  Skin is warm, dry and intact.  Psychiatric: Mood and affect are normal.   ____________________________________________    EKG  EKG viewed and interpreted by myself shows a sinus rhythm at 100 bpm with a narrow QRS, left axis deviation, largely normal intervals with nonspecific ST changes.  ____________________________________________    RADIOLOGY  IMPRESSION:  1. Very low lung volumes with gas distended stomach now. Consider  follow-up abdominal radiographs.  2. Continued left lung base infection versus atelectasis although a  previously suspected left pleural effusion seems regressed.  3. Right lung remains  clear.   ____________________________________________   INITIAL IMPRESSION / ASSESSMENT AND PLAN / ED COURSE  Pertinent labs & imaging results that were available during my care of the patient were reviewed by me and considered in my medical decision making (see chart for  details).   Patient with a history of myasthenia gravis recent hospital admission for left lung pneumonia presents to the emergency department for persistent shortness of breath.  Family states still gets short of breath with minimal exertion.  Patient saw her psychiatrist today who did adjust her medications, but states due to the shortness of breath concerns they recommended she go to the emergency department for evaluation.  Here the patient overall appears well, she does have slight tachypnea but no wheeze rales or rhonchi.  Currently satting between 93 and 95% on room air.  This appears to be largely unchanged from her discharge vital signs on 10/28/2019.    X-ray shows low lung volumes with distended stomach.  We will obtain abdominal x-rays as a precaution although patient denies abdominal discomfort and has a benign abdominal exam.  X-ray does show improvement in left lung effusion but continues to have some atelectasis in left lung base versus infection.  We will obtain a Covid swab as a precaution as well.  Overall the patient appears well.  X-ray shows gaseous distention of the intestines but no sign of obstruction at least high-grade obstruction.  Spoke to family patient had a normal bowel movement this morning.  No concern for obstruction clinically.  Patient continues to sat around 94% on room air.  Appears well.  Will discharge patient from the emergency department with continued supportive care at home and PCP follow-up.  Patient and family agreeable.    Madison Burns was evaluated in Emergency Department on 11/04/2019 for the symptoms described in the history of present illness. She was evaluated in the context of  the global COVID-19 pandemic, which necessitated consideration that the patient might be at risk for infection with the SARS-CoV-2 virus that causes COVID-19. Institutional protocols and algorithms that pertain to the evaluation of patients at risk for COVID-19 are in a state of rapid change based on information released by regulatory bodies including the CDC and federal and state organizations. These policies and algorithms were followed during the patient's care in the ED.  ____________________________________________   FINAL CLINICAL IMPRESSION(S) / ED DIAGNOSES  Dyspnea   Harvest Dark, MD 11/04/19 2306

## 2019-11-04 NOTE — ED Triage Notes (Signed)
fam member says patient just got out of hospital last week and is breathing worse than she was.  She was not sent home with oxygen.  Her pcp and psychiatrist told her to go to [facility].  Then she was sent over here.

## 2019-11-04 NOTE — Telephone Encounter (Signed)
Called Stephine from [facility] and left a voicemail for the Verbal Orders and to call back if they have any questions.

## 2019-11-04 NOTE — Telephone Encounter (Signed)
Ok

## 2019-11-09 ENCOUNTER — Other Ambulatory Visit: Payer: PPO

## 2019-11-09 ENCOUNTER — Ambulatory Visit: Payer: PPO

## 2019-11-10 ENCOUNTER — Encounter: Payer: Self-pay | Admitting: *Deleted

## 2019-11-10 ENCOUNTER — Other Ambulatory Visit: Payer: Self-pay

## 2019-11-10 ENCOUNTER — Telehealth (INDEPENDENT_AMBULATORY_CARE_PROVIDER_SITE_OTHER): Payer: PPO | Admitting: Internal Medicine

## 2019-11-10 DIAGNOSIS — G4733 Obstructive sleep apnea (adult) (pediatric): Secondary | ICD-10-CM | POA: Diagnosis not present

## 2019-11-10 DIAGNOSIS — J189 Pneumonia, unspecified organism: Secondary | ICD-10-CM | POA: Diagnosis not present

## 2019-11-10 DIAGNOSIS — I10 Essential (primary) hypertension: Secondary | ICD-10-CM

## 2019-11-10 DIAGNOSIS — R109 Unspecified abdominal pain: Secondary | ICD-10-CM

## 2019-11-10 DIAGNOSIS — F329 Major depressive disorder, single episode, unspecified: Secondary | ICD-10-CM | POA: Diagnosis not present

## 2019-11-10 DIAGNOSIS — J9601 Acute respiratory failure with hypoxia: Secondary | ICD-10-CM | POA: Diagnosis not present

## 2019-11-10 DIAGNOSIS — E7800 Pure hypercholesterolemia, unspecified: Secondary | ICD-10-CM

## 2019-11-10 DIAGNOSIS — E785 Hyperlipidemia, unspecified: Secondary | ICD-10-CM | POA: Diagnosis not present

## 2019-11-10 DIAGNOSIS — E039 Hypothyroidism, unspecified: Secondary | ICD-10-CM

## 2019-11-10 DIAGNOSIS — G40909 Epilepsy, unspecified, not intractable, without status epilepticus: Secondary | ICD-10-CM

## 2019-11-10 DIAGNOSIS — D4989 Neoplasm of unspecified behavior of other specified sites: Secondary | ICD-10-CM | POA: Diagnosis not present

## 2019-11-10 DIAGNOSIS — I4891 Unspecified atrial fibrillation: Secondary | ICD-10-CM

## 2019-11-10 DIAGNOSIS — Z8701 Personal history of pneumonia (recurrent): Secondary | ICD-10-CM | POA: Diagnosis not present

## 2019-11-10 DIAGNOSIS — E669 Obesity, unspecified: Secondary | ICD-10-CM | POA: Diagnosis not present

## 2019-11-10 DIAGNOSIS — C642 Malignant neoplasm of left kidney, except renal pelvis: Secondary | ICD-10-CM

## 2019-11-10 DIAGNOSIS — C37 Malignant neoplasm of thymus: Secondary | ICD-10-CM | POA: Diagnosis not present

## 2019-11-10 DIAGNOSIS — Z85528 Personal history of other malignant neoplasm of kidney: Secondary | ICD-10-CM | POA: Diagnosis not present

## 2019-11-10 DIAGNOSIS — K219 Gastro-esophageal reflux disease without esophagitis: Secondary | ICD-10-CM | POA: Diagnosis not present

## 2019-11-10 DIAGNOSIS — Z9989 Dependence on other enabling machines and devices: Secondary | ICD-10-CM

## 2019-11-10 DIAGNOSIS — G7000 Myasthenia gravis without (acute) exacerbation: Secondary | ICD-10-CM

## 2019-11-10 DIAGNOSIS — K589 Irritable bowel syndrome without diarrhea: Secondary | ICD-10-CM | POA: Diagnosis not present

## 2019-11-10 DIAGNOSIS — Z6839 Body mass index (BMI) 39.0-39.9, adult: Secondary | ICD-10-CM | POA: Diagnosis not present

## 2019-11-10 NOTE — Patient Outreach (Signed)
[facility]) Care Management  11/10/2019  KAZI MONTORO 1950-12-15 [id number]  Referral from [facility]TA for NP notes from chart review.  Hx: Recent hospitalization for sepsis and subsequent ED visit for SOB. MG, Thymoma, Renal cell ca, developmental delay, AFIB, HTN OSA, Obesity, Respiratory failure, IBS, GERD, Hypothyroidism, Mood disorder, melanoma,   Lives with sister.  Unanswered outreach. Will call again next week.  [name]. [name], MSN, [facility] Gerontological Nurse Practitioner [facility] Care Management [phone]

## 2019-11-10 NOTE — Progress Notes (Addendum)
Patient ID: Madison Burns, female   DOB: 1950/06/02, 69 y.o.   MRN: [id number]   Virtual Visit via video Note  This visit type was conducted due to national recommendations for restrictions regarding the COVID-19 pandemic (e.g. social distancing).  This format is felt to be most appropriate for this patient at this time.  All issues noted in this document were discussed and addressed.  No physical exam was performed (except for noted visual exam findings with Video Visits).   I connected with [name] by a video enabled telemedicine application and verified that I am speaking with the correct person using two identifiers. Location patient: home Location provider: work Persons participating in the virtual visit: patient, provider and pts sister [name]  The limitations, risks, security and privacy concerns of performing an evaluation and management service by video and the availability of in person appointments have been discussed.  It has also been discussed with the patient that there may be a patient responsible charge related to this service. The patient expressed understanding and agreed to proceed.   Reason for visit:  Follow up appt.   HPI: Was recently admitted after presenting with chest pain and cough.  In ER found to have left base opacity and leukocytosis.  Also concern regarding MG flare.  Started on IV abx and IVIG.  Diagnosed with sepsis due to community acquired pneumonia.  She developed worsening respiratory status while in hospital.  Felt to be from neuromuscular weakness secondary to MG flare.  Neuro consulted, BiPAP started and IVIG started.  Completed course 10/26/19.  Also treated intermittently with lasix for edema - with good response.  Weaned off BiPAP.  Breathing improved.  Also diagnosed with new onset afib with RVR.  Cardiology consulted.  Recommended no anticoagulation.  Initially on cardizem drip.  Converted to SR.  Continued on oral cardizem. Oncology consulted while  in hospital.  Had f/u imaging and thymoma noted to have decreased in size, no mediastinal lymphadenopathy or pleural nodules.  Was reevaluated in ER 11/04/19 with concerns regarding some persistent sob.  Xray - low lung volumes with distended stomach,  improvement in left lung effusion, some atelectasis in the left lung base versus infection.   Abdominal xray reveals some gaseous distention of the intestines but no sign of obstruction.  Discharged home.  At home, sister reported breathing is overall stable.  Has some weakness and fatigue.  Still with sob with increased exertion, but overall breathing improved from hospitalization.  Has f/u planned with neurology.  Using cpap.  Sleeping in hospital bed.  Sister concerned her symptoms are more related to MG flare.  Off MTX.  Eating.  No nausea or vomiting reported.  Bowels moving.  No increased abdominal pain.     ROS: See pertinent positives and negatives per HPI.  Past Medical History:  Diagnosis Date  . Cancer of kidney ([place]) 2021  . Complication of anesthesia    Myasthenia gravis  . GERD (gastroesophageal reflux disease)   . History of seizure disorder   . Hypercholesterolemia   . Hypertension    no longer has it. wt loss  . Hypothyroidism   . Irritable bowel syndrome   . Myasthenia gravis ([place]) 2011  . OSA on CPAP   . Skin cancer 2017   Melanoma on neck  . Thymoma, malignant [facility])     Past Surgical History:  Procedure Laterality Date  . ABDOMINAL HYSTERECTOMY  1990  . APPENDECTOMY  1999  . CHOLECYSTECTOMY  2013  .  COLONOSCOPY WITH PROPOFOL N/A 06/30/2017   Procedure: COLONOSCOPY WITH PROPOFOL;  Surgeon: Toledo, Benay Pike, MD;  Location: [facility] ENDOSCOPY;  Service: Gastroenterology;  Laterality: N/A;  . CYSTOSCOPY W/ URETERAL STENT PLACEMENT Left 08/17/2019   Procedure: CYSTOSCOPY WITH RETROGRADE PYELOGRAM/URETERAL STENT PLACEMENT;  Surgeon: Alexis Frock, MD;  [facility];  Service: Urology;  Laterality: Left;  30 MINS  .  ESOPHAGOGASTRODUODENOSCOPY (EGD) WITH PROPOFOL N/A 06/30/2017   Procedure: ESOPHAGOGASTRODUODENOSCOPY (EGD) WITH PROPOFOL;  Surgeon: Toledo, Benay Pike, MD;  Location: [facility] ENDOSCOPY;  Service: Gastroenterology;  Laterality: N/A;  . IR RADIOLOGIST EVAL & MGMT  06/28/2019  . IR RADIOLOGIST EVAL & MGMT  09/14/2019  . RADIOFREQUENCY ABLATION Left 08/17/2019   Procedure: LEFT RENAL CRYO ABLATION;  Surgeon: Jacqulynn Cadet, MD;  [facility];  Service: Anesthesiology;  Laterality: Left;  . TONSILLECTOMY  1959    Family History  Problem Relation Age of Onset  . Colon cancer Maternal Grandfather        stomach/colon  . Heart disease Father        myocardial infarction  . Hyperlipidemia Father   . Hyperlipidemia Sister   . Diabetes Sister   . Bone cancer Other        cousin  . Alzheimer's disease Mother        maternal aunts  . Skin cancer Mother   . Myasthenia gravis Brother        ocular  . Skin cancer Brother   . Stroke Paternal Grandfather   . Breast cancer Neg Hx     SOCIAL HX: reviewed.    Current Outpatient Medications:  .  Ascorbic Acid (VITAMIN C) 1000 MG tablet, Take 1,000 mg by mouth at bedtime., Disp: , Rfl:  .  B Complex-C (B-COMPLEX WITH VITAMIN C) tablet, Take 1 tablet by mouth every evening., Disp: , Rfl:  .  benzonatate (TESSALON) 100 MG capsule, Take 1 capsule (100 mg total) by mouth in the morning and at bedtime. MORNING & 1430, Disp: , Rfl:  .  Biotin 10000 MCG TABS, Take 10,000 mcg by mouth daily at 2 PM. (1430), Disp: , Rfl:  .  Calcium-Phosphorus-Vitamin D (CITRACAL +D3 PO), Take 1 tablet by mouth daily., Disp: , Rfl:  .  diltiazem (CARDIZEM CD) 180 MG 24 hr capsule, Take 1 capsule (180 mg total) by mouth daily., Disp: 30 capsule, Rfl: 2 .  DULoxetine (CYMBALTA) 30 MG capsule, Take 1 capsule (30 mg total) by mouth daily., Disp: 30 capsule, Rfl: 5 .  folic acid (FOLVITE) 1 MG tablet, 2 mg every day po, Disp: 60 tablet, Rfl: 5 .  Garlic 4196 MG CAPS, Take 1,000  mg by mouth in the morning, at noon, and at bedtime. (0800, 1430, 2000), Disp: , Rfl:  .  ketoconazole (NIZORAL) 2 % cream, SMARTSIG:1 Application Topical 1 to 2 Times Daily, Disp: , Rfl:  .  levothyroxine (SYNTHROID) 75 MCG tablet, TAKE 1 TABLET BY MOUTH DAILY ON AN EMPTYSTOMACH. WAIT 30 MINUTES BEFORE TAKING OTHER MEDS (Patient taking differently: Take 75 mcg by mouth daily before breakfast. ), Disp: 90 tablet, Rfl: 1 .  methotrexate 2.5 MG tablet, Caution:Chemotherapy. Protect from light.take 4 tab once a week po. Take folic acid with it., Disp: 16 tablet, Rfl: 5 .  pantoprazole (PROTONIX) 40 MG tablet, Take 40 mg by mouth daily. , Disp: , Rfl:  .  predniSONE (DELTASONE) 20 MG tablet, Take 3 tab for 2 days, 1 tab for 2 days , 1 tab for 6 days.,  Disp: 15 tablet, Rfl: 0 .  Probiotic Product (PROBIOTIC MULTI-ENZYME PO), Take 1 capsule by mouth in the morning and at bedtime. Probiotic Multi-Enzyme Digestive Formula, Disp: , Rfl:  .  pyridostigmine (MESTINON) 60 MG tablet, TAKE 1 TABLET BY MOUTH AT  8 AM, ONE AT LUNCH AND ONE AT 8PM, Disp: 90 tablet, Rfl: 6 .  risperiDONE (RISPERDAL) 1 MG tablet, Take 1 mg by mouth at bedtime. , Disp: , Rfl:  .  sucralfate (CARAFATE) 1 g tablet, Take 1 g by mouth 2 (two) times daily. (0800 & 1700), Disp: , Rfl:  .  tolterodine (DETROL Burns) 4 MG 24 hr capsule, TAKE 1 CAPSULE BY MOUTH ONCE EVERY MORNING (Patient taking differently: Take 4 mg by mouth daily. ), Disp: 90 capsule, Rfl: 1 .  vitamin E 180 MG (400 UNITS) capsule, Take 400 Units by mouth daily at 2 PM. 1430, Disp: , Rfl:   EXAM:  GENERAL: answering questions.  Sitting on cough.  Slumped back.  No acute distress.   HEENT: atraumatic, conjunttiva clear, no obvious abnormalities on inspection of external nose and ears  NECK: normal movements of the head and neck  LUNGS: on inspection no signs of respiratory distress, breathing rate appears normal, no obvious [place] SOB, gasping or wheezing  CV: no obvious  cyanosis  PSYCH/NEURO: pleasant and cooperative, no obvious depression or anxiety, speech and thought processing grossly intact  ASSESSMENT AND PLAN:  Discussed the following assessment and plan:  Thymoma Followed by oncology.  S/p chemo and XRT.  Recent scan - decrease in size.    Seizure disorder Followed by neurology.   Renal cell cancer, left ([place]) Followed by urology.   OSA on CPAP Using cpap regularly.  Continues to f/u with neurology.   Myasthenia gravis Followed by neurology.  Recently hospitalized as outlined.  Treated with IVIG.  Due to f/u with neurology soon to discussed restarting MTX. Concern some of her symptoms related to MG flare.   Hypothyroidism On thyroid replacement.  Follow tsh.   Hypertension Blood pressure has been doing well. On oral cardizem.  Follow pressures and metabolic panel.   Hypercholesterolemia Declines cholesterol medication.  Low cholesterol diet and exercise.  Follow lipid panel.   Community acquired pneumonia Recently admitted with pneumonia as outlined.  Will need f/u xray/scan.    Atrial fibrillation with RVR ([place]) Diagnosed with afib in hospital.  Treated with cardizem drip and converted to oral cardizem.  Converted to SR in hospital.  Cardiology consulted.  Deferred anticoagulation.  Felt to be related to sepsis and acute infection.  Follow.    Abdominal pain No increased abdominal pain reported.  Xray as outlined.  Having bowel movements.  Follow.    Acute respiratory failure with hypoxia (HCC) Oxygen saturation dropping when pt walks.  Have home health walk and record oxygen to see if qualifies for home oxygen.      I discussed the assessment and treatment plan with the patient. The patient was provided an opportunity to ask questions and all were answered. The patient agreed with the plan and demonstrated an understanding of the instructions.   The patient was advised to call back or seek an in-person evaluation if the  symptoms worsen or if the condition fails to improve as anticipated.   Einar Pheasant, MD

## 2019-11-11 ENCOUNTER — Telehealth: Payer: Self-pay | Admitting: Family Medicine

## 2019-11-11 NOTE — Telephone Encounter (Signed)
sister states left eye drooping almost closed at 10am,head drooping to the side. pt worsening. Sister(Hoyt,Nancy on DPR) is asking to be called.

## 2019-11-14 NOTE — Telephone Encounter (Signed)
Spoke to nancy, She does not believe pt has infection,  She cannot remember which medications pt mixed up   appt made With Dr Brett Fairy tomorrow 11/15/19

## 2019-11-14 NOTE — Telephone Encounter (Signed)
Spoke to pt's sister nancy  She states pt has declined and has not improved since initail ED visit on 9/6-9/10  Then returned to ED on 09/17 for SOB   Reports both eyes and head drooping, severe fatigue, balance and memory is worse.  States pt mixed up her pills last week and is unable to dress herself, Pt's sister is very concerned about drastic change in behavior.

## 2019-11-14 NOTE — Telephone Encounter (Signed)
Do we have any availability with my schedule or Dr Dohmeier's schedule this week? I would also recommend that she been seen asap by PCP to rule out any concerns for infection. I am not sure which medications she mixed up? Was this just once? And which meds?

## 2019-11-15 ENCOUNTER — Ambulatory Visit: Payer: PPO | Admitting: Neurology

## 2019-11-15 ENCOUNTER — Encounter: Payer: Self-pay | Admitting: Neurology

## 2019-11-15 VITALS — BP 142/98 | HR 113 | Ht 63.0 in | Wt 197.0 lb

## 2019-11-15 DIAGNOSIS — G7001 Myasthenia gravis with (acute) exacerbation: Secondary | ICD-10-CM | POA: Diagnosis not present

## 2019-11-15 DIAGNOSIS — F73 Profound intellectual disabilities: Secondary | ICD-10-CM | POA: Insufficient documentation

## 2019-11-15 DIAGNOSIS — R0609 Other forms of dyspnea: Secondary | ICD-10-CM

## 2019-11-15 DIAGNOSIS — C37 Malignant neoplasm of thymus: Secondary | ICD-10-CM | POA: Insufficient documentation

## 2019-11-15 DIAGNOSIS — G40309 Generalized idiopathic epilepsy and epileptic syndromes, not intractable, without status epilepticus: Secondary | ICD-10-CM | POA: Diagnosis not present

## 2019-11-15 DIAGNOSIS — C649 Malignant neoplasm of unspecified kidney, except renal pelvis: Secondary | ICD-10-CM

## 2019-11-15 DIAGNOSIS — D6481 Anemia due to antineoplastic chemotherapy: Secondary | ICD-10-CM

## 2019-11-15 DIAGNOSIS — R0600 Dyspnea, unspecified: Secondary | ICD-10-CM

## 2019-11-15 DIAGNOSIS — T451X5A Adverse effect of antineoplastic and immunosuppressive drugs, initial encounter: Secondary | ICD-10-CM | POA: Insufficient documentation

## 2019-11-15 DIAGNOSIS — G4733 Obstructive sleep apnea (adult) (pediatric): Secondary | ICD-10-CM

## 2019-11-15 MED ORDER — METHOTREXATE SODIUM 2.5 MG PO TABS: ORAL_TABLET | ORAL | 5 refills | Status: DC

## 2019-11-15 MED ORDER — FOLIC ACID 1 MG PO TABS: ORAL_TABLET | ORAL | 5 refills | Status: DC

## 2019-11-15 MED ORDER — PYRIDOSTIGMINE BROMIDE 60 MG PO TABS: ORAL_TABLET | ORAL | 6 refills | Status: DC

## 2019-11-15 MED ORDER — PREDNISONE 20 MG PO TABS: ORAL_TABLET | ORAL | 0 refills | Status: DC

## 2019-11-15 NOTE — Progress Notes (Signed)
PATIENT: Madison Burns DOB: [birth year]  REASON FOR VISIT: follow up HISTORY FROM: patient  Chief Complaint  Patient presents with   Follow-up    pt with sister, rm 76. states that around 3 weeks ago she had developed PNA (- for Covid) it has really "kicked her butt" she has had increased in weakness, slumped over with walking, eyes are droopy, very short of breath with exertion. states with movement oxygen level will drop but comes back up. just from walking from waiting area to room O2 dropped to 82%, once sitting came back up to 90%.    Other    wanted to discuss these concerns, and whether start back methotrexate     HISTORY OF PRESENT ILLNESS: Today 11/15/19 Madison Burns is a 69 y.o. female with mental retardation, living with her sister. Madison Burns has now been diagnosed with renal cell cancer and her Myasthenia has taken a turn for the worse. She is presenting with ptosis , neck weakness and left shoulder slump. She is no longer able to walk unassisted, presents stooped. Her voice is hoarse.  She was last seen by Madison Burns , NP on 09-06-2019.  She also has developed wheezing in the meantime but she last saw Madison she presented much less impaired.  In the meantime which she had called our office.  She has completed her chemoradiation therapy and she is certainly has a weaker immune system she also developed pneumonia and sepsis following her cancer treatment and surgery.  She was seen at the [facility] emergency room just 10 days ago on 9-17 21 with breathing difficulties shortness of breath thought to be due to left-sided pneumonia and possible myasthenia gravis exacerbation.  She underwent treatment with antibiotics and IVIG and ultimately was discharged after week.  She gets short of breath now with exertion still, sometimes she is wheezing. She is sleeping in a hospital bed, and she cannot breath  easily- has orthopnea. This bed is adjustable and helps her  shortness of breath.   Since the patient was discharged from the hospital she has resumed compliant use of her CPAP and would now be 100% for the time since discharge.  Average use at time 9 hours 2 minutes, she is above average sleepy the CPAP is set to 5 cmH2O with 3 cm EPR and her AHI is 2.2 which is an excellent resolution of this otherwise mild apnea which was more classified as a high apnea.  I am worried that she is so weak at this time that she may not participate well in physical therapy or gait stabilization but I will wait if she has regained some of her strength and respiratory capacity in the next 3 months so that we could do some physical therapy as well.      1-20-2021CD Madison Burns has been on medication for myasthenia gravis, she had lost some weight since eating mostly at home during the pandemic.  She still has a good appetite no trouble swallowing she had no falls and no changes in gait.  No taking any seizure medicine, she has continued Mestinon and methotrexate for myasthenia gravis and it was felt that she was fairly stable.   Then she learned that she was diagnosed with a mediastinal tumor that is close to the upper aortic arch,  In addition to the recent discovery of , what may be a thymoma,  she had a history of melanoma which was removed in 2014 from the right  side of the neck close to the TM joint, and she now has a spot on her kidney presumed to be malignant. This would be her third tumor.   IMPRESSION: 1. Apparent subtle 3.2 cm homogeneously hypoenhancing lesion in the posterior interpolar left kidney. Imaging features raise concern for papillary type renal cell carcinoma. Abdominal MRI with and without contrast recommended to further evaluate. 2. Bilateral adrenal nodules. These could also be further assessed at the time of follow-up MRI. 3. No specific findings to explain the patient's history of periumbilical pain. 4. Tiny gas bubble identified in the urinary  bladder, presumably from recent instrumentation. In the absence of instrumentation, infection would be a consideration. 5. Insert [name] 6. Left colonic diverticulosis without diverticulitis.  She has been followed here by me for OSA- for obstructive sleep apnea and has been a very compliant CPAP user V visit for sleep apnea is not to do until September 2021 with Madison Burns.    Madison Burns, 11-03-2018 here today for follow up of MG, seizure, and OSA on CPAP. She is here today with her sister , Madison Burns who aids in history. She continues mestinon and methotrexate for MG and feels symptoms are fairly stable. No vision changes, no double vision. No new or worsening weakness. She has lost some weight as they are eating at home more since pandemic. She has a good appetite. No trouble swallowing. No changes in gait or falls. She is able to perform ADL's. She lives with her sister. She has not taken seizure medication in many years with no seizure activity noted. No adverse effects or worsening symptoms with decreased dose of methotrexate.   Compliance report dated 10/03/2018 through 11/01/2018 reveals that she is using CPAP nightly for compliance 100%.  She is using CPAP greater than 4 hours every night.  Average usage is 9 hours and 57 minutes.  AHI is 1.9 on 5 cm of water.  There is no significant leak.  HISTORY: (copied from [employer] note on 01/05/2018)  HISTORY CDRebecca H Burns a 69 y.o.femaleseen here as a referral from Madison Burns Myasthenia gravis, per special request of the patient's sister .  Madison Burns carries a diagnosis of myasthenia gravis associated with difficulties swallowing, with a decreased level of energy a high degree of fatigue sometimes best interest in activities facial droop eyelid droop sleepiness snoring easy bruising, coughing, trouble swallowing she has a mild hoarseness but not as significant dysphonia and incontinence.  Her family and the patient looking for a  maintenance neurologist to keep her myasthenia under control. The patient has been diagnosed and treated by Madison Burns in [place]- until his death.She has been followed by Madison. Melrose Nakayama at the [facility] clinic after following Madison. Paulla Dolly for 18 month .  The patient was diagnosed in 2011 by Madison Burns-  with myasthenia gravis. She was hospitalized with myasthnic crisis Jan 27 2010- through Januery 14th 2012. She was send to rehabilitation after that, by 03-2010. [name]  Her sister noticed a decline in cognitive skills at that time.  She has noted some core weakness- MG , no ptosis, no double vision.    She  is sleeping 12 hours easily per day, from 10 Pm to 8 AM. She has a droopy right eye.  She missed frequently the 3rd. Mestinon.  She has been followed by Madison. Melrose Nakayama at the [facility] clinic after following Madison. Paulla Dolly . Again, her first treatment had been prednisone and this was just weaned off by  September 2015. The patient carries a diagnosis of hypercholesterolemia. She also has a past surgical history of appendectomy in 1999, hysterectomy in 2004 and the melanoma was removed in 2014 from the right side of the neck close to the TM joint.  Madison Burns is single without children, Lives with her sister now used to live with her mother. She is MRDD, she left school in 7th grade, went to a special facility until her GED, [employer]. She used to work at a [employer] ,later in a vocational trade.   Ms. Kington is taking methotrexate to control them myasthenia she is also on Mestinon 60 mg 3 times a day she takes Detrol for urinary continence at 4 mg in the morning and she takes Risperdal 2 mg I mouth at bedtime. She is also treated for hypothyroidism with levothyroxine at 50 g daily. The methotrexate is given at 8 tablets once a week. Until last September 2015 the patient had been on prednisone. She has not had labs drawn for the last 5 month, while continuing on MTX.  4/19/17Today's visit is solely dedicated  to the sleep disorder part of her neurological care Madison Burns underwent a sleep apnea test as a home sleep test, the AHI was 20.7 the lowest desaturation was 61 with 309 minutes of desaturation. This of course a possibility that in a home sleep test the hypoxemia is overestimated or artifact related but she does have a significant amount of obstructive sleep apnea as well. For this reason and because of her underlying condition of myasthenia gravis the patient was asked to undergo an attended CPAP titration. This took place on 04-19-14 the AHI was 0.3 on CPAP of only 5 cm water. Her SPO2 nadir rose to 91% no added oxygen was needed. I prescribed a ResMed air-fluid P 10 in medium size and an auto CPAP from 4 through 8 cm water. The patient states that she has not received CPAP machine that she had an outstanding bill was advanced home care which may be the reason.  She is on Medicare/ Medicaid and a sleep study cannot be older than 6 months to allow her to obtain a machine - so I will have to repeat her sleep study. Her Epworth sleepiness score remains high at 17 points, her depression score at 3.5, fatigue severity questionnaire at 38 points. MRDD - needs assistence with CPAP, certainly would need a desensitization session with [facility] in [place].    UPDATE 10/19/2017CMMs. Burns, 69 year old female returns for follow-up with her mother. She has a history of obstructive sleep apnea on CPAP compliance report today is 100% for 30 days average usage 10 hours 6 minutes AHI is 1.1. No leaks. In addition she has myasthenia gravis and is currently on Mestinon and methotrexate. She needs refills. No recent falls. She has no facial weakness no trouble swallowing. She also has a history of seizure disorder but has not had seizures in many years and has been weaned off of her seizure medication. She returns for reevaluation  02/23/17 CDI have the pleasure of following Madison Burns for her obstructive sleep apnea,and  she has again proven to be a highly compliant CPAP user. Her CPAP compliance is 93% with 9 hours and 9 minutes on average daily use. CPAP is set at only 5 cm water pressure and allows for reduction of the AHI to 0.6. No central apneas are emerging and she has very few air leaks. Her baseline AHI in 02-2015 was 20.8/hr."She reports dreaming more- good  dreams". She sleeps well. She has trouble with swallowing solid foods. In addition she has not had significant muscle weakness, no falls, but she does feel easily fatigued and sleepy. She endorsed the Epworth score today on 11 and the fatigue severity at 54 points. 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 She often cried during this task.   UPDATE 11/19/2019CM Madison Burns, 69 year old female returns for follow-up with history of obstructive sleep apnea on CPAP.  She also has a history of myasthenia gravis and seizure disorder.  She is no longer on seizure medication and no seizures in many years.  CPAP compliance dated 12/05/2017-01/03/2018 shows compliance greater than 4 hours at 100%.  Set pressure 5 cm.  Average usage 10 hours 29 minutes.  No leak.  AHI 1.3.  She remains on Mestinon and methotrexate for her myasthenia gravis which has been stable.  She complains of intermittent ptosis of the right eye when fatigued.  This is not evident today.  She has no double vision.  Appetite is good and she sleeps well she returns for reevaluation   REVIEW OF SYSTEMS: Out of a complete 14 system review of symptoms, the patient complains only of the following symptoms, none and all other reviewed systems are negative.   SOB, stooped, head drop on the chest, ptosis. Fatigue.  thymoma, malignant (?).  Mediastinal LN swelling and renal pole cancer , l has had chemotherapy and radiation, sepsis and pneumonia. left kidney  Still present the tumor was " frozen". .      How likely are you to doze in the following  situations: 0 = not likely, 1 = slight chance, 2 = moderate chance, 3 = high chance  Sitting and Reading? Watching Television? Sitting inactive in a public place (theater or meeting)? Lying down in the afternoon when circumstances permit? Sitting and talking to someone? As a passenger in a car for an hour without a break?  Total = 16/18, much higher than last time.    Crying, depression.    ALLERGIES: No Known Allergies  HOME MEDICATIONS: Outpatient Medications Prior to Visit  Medication Sig Dispense Refill   Ascorbic Acid (VITAMIN C) 1000 MG tablet Take 1,000 mg by mouth at bedtime.     B Complex-C (B-COMPLEX WITH VITAMIN C) tablet Take 1 tablet by mouth every evening.     benzonatate (TESSALON) 100 MG capsule Take 1 capsule (100 mg total) by mouth in the morning and at bedtime. MORNING & 1430     Biotin 10000 MCG TABS Take 10,000 mcg by mouth daily at 2 PM. (1430)     Calcium-Phosphorus-Vitamin D (CITRACAL +D3 PO) Take 1 tablet by mouth daily.     diltiazem (CARDIZEM CD) 180 MG 24 hr capsule Take 1 capsule (180 mg total) by mouth daily. 30 capsule 2   DULoxetine (CYMBALTA) 30 MG capsule Take 1 capsule (30 mg total) by mouth daily. 30 capsule 5   Garlic 4742 MG CAPS Take 1,000 mg by mouth in the morning, at noon, and at bedtime. (0800, 1430, 2000)     ketoconazole (NIZORAL) 2 % cream SMARTSIG:1 Application Topical 1 to 2 Times Daily     levothyroxine (SYNTHROID) 75 MCG tablet TAKE 1 TABLET BY MOUTH DAILY ON AN EMPTYSTOMACH. WAIT 30 MINUTES BEFORE TAKING OTHER MEDS (Patient taking differently: Take 75 mcg by mouth daily before breakfast. ) 90 tablet 1   pantoprazole (PROTONIX) 40 MG tablet Take 40 mg by  mouth daily.      Probiotic Product (PROBIOTIC MULTI-ENZYME PO) Take 1 capsule by mouth in the morning and at bedtime. Probiotic Multi-Enzyme Digestive Formula     pyridostigmine (MESTINON) 60 MG tablet TAKE 1 TABLET BY MOUTH AT  8 AM, ONE AT LUNCH AND ONE AT 8PM 90  tablet 6   risperiDONE (RISPERDAL) 1 MG tablet Take 1 mg by mouth at bedtime.      sucralfate (CARAFATE) 1 g tablet Take 1 g by mouth 2 (two) times daily. (0800 & 1700)     tolterodine (DETROL LA) 4 MG 24 hr capsule TAKE 1 CAPSULE BY MOUTH ONCE EVERY MORNING (Patient taking differently: Take 4 mg by mouth daily. ) 90 capsule 1   vitamin E 180 MG (400 UNITS) capsule Take 400 Units by mouth daily at 2 PM. 1430     No facility-administered medications prior to visit.    PAST MEDICAL HISTORY: Past Medical History:  Diagnosis Date   Cancer of kidney ([place]) 2376   Complication of anesthesia    Myasthenia gravis   GERD (gastroesophageal reflux disease)    History of seizure disorder    Hypercholesterolemia    Hypertension    no longer has it. wt loss   Hypothyroidism    Irritable bowel syndrome    Myasthenia gravis ([place]) 2011   OSA on CPAP    Skin cancer 2017   Melanoma on neck   Thymoma, malignant ([place])     PAST SURGICAL HISTORY: Past Surgical History:  Procedure Laterality Date   ABDOMINAL HYSTERECTOMY  1990   APPENDECTOMY  1999   CHOLECYSTECTOMY  2013   COLONOSCOPY WITH PROPOFOL N/A 06/30/2017   Procedure: COLONOSCOPY WITH PROPOFOL;  Surgeon: Toledo, Benay Pike, MD;  Location: [facility] ENDOSCOPY;  Service: Gastroenterology;  Laterality: N/A;   CYSTOSCOPY W/ URETERAL STENT PLACEMENT Left 08/17/2019   Procedure: CYSTOSCOPY WITH RETROGRADE PYELOGRAM/URETERAL STENT PLACEMENT;  Surgeon: Alexis Frock, MD;  Location: [facility];  Service: Urology;  Laterality: Left;  30 MINS   ESOPHAGOGASTRODUODENOSCOPY (EGD) WITH PROPOFOL N/A 06/30/2017   Procedure: ESOPHAGOGASTRODUODENOSCOPY (EGD) WITH PROPOFOL;  Surgeon: Toledo, Benay Pike, MD;  Location: [facility] ENDOSCOPY;  Service: Gastroenterology;  Laterality: N/A;   IR RADIOLOGIST EVAL & MGMT  06/28/2019   IR RADIOLOGIST EVAL & MGMT  09/14/2019   RADIOFREQUENCY ABLATION Left 08/17/2019   Procedure: LEFT RENAL CRYO ABLATION;  Surgeon:  Jacqulynn Cadet, MD;  Location: [facility];  Service: Anesthesiology;  Laterality: Left;   TONSILLECTOMY  1959    FAMILY HISTORY: Family History  Problem Relation Age of Onset   Colon cancer Maternal Grandfather        stomach/colon   Heart disease Father        myocardial infarction   Hyperlipidemia Father    Hyperlipidemia Sister    Diabetes Sister    Bone cancer Other        cousin   Alzheimer's disease Mother        maternal aunts   Skin cancer Mother    Myasthenia gravis Brother        ocular   Skin cancer Brother    Stroke Paternal Grandfather    Breast cancer Neg Hx     SOCIAL HISTORY: Social History   Socioeconomic History   Marital status: Single    Spouse name: Not on file   Number of children: 0   Years of education: Special Ed   Highest education level: Not on file  Occupational History   Occupation:  Unemployed  Tobacco Use   Smoking status: Never Smoker   Smokeless tobacco: Never Used  Vaping Use   Vaping Use: Never used  Substance and Sexual Activity   Alcohol use: No    Alcohol/week: 0.0 standard drinks   Drug use: No   Sexual activity: Not Currently  Other Topics Concern   Not on file  Social History Narrative   06/23/17 lives with sister, Madison Burns   Regular exercise-no   Caffeine Use-yes   Social Determinants of Health   Financial Resource Strain:    Difficulty of Paying Living Expenses: Not on file  Food Insecurity:    Worried About Charity fundraiser in the Last Year: Not on file   [employer] of Food in the Last Year: Not on file  Transportation Needs:    Lack of Transportation (Medical): Not on file   Lack of Transportation (Non-Medical): Not on file  Physical Activity:    Days of Exercise per Week: Not on file   Minutes of Exercise per Session: Not on file  Stress:    Feeling of Stress : Not on file  Social Connections:    Frequency of Communication with Friends and Family: Not on file   Frequency of  Social Gatherings with Friends and Family: Not on file   Attends Religious Services: Not on file   Active Member of Clubs or Organizations: Not on file   Attends Archivist Meetings: Not on file   Marital Status: Not on file  Intimate Partner Violence:    Fear of Current or Ex-Partner: Not on file   Emotionally Abused: Not on file   Physically Abused: Not on file   Sexually Abused: Not on file      PHYSICAL EXAM  Vitals:   11/15/19 1043  BP: (!) 142/98  Pulse: (!) 113  Weight: 197 lb (89.4 kg)   Body mass index is 36.03 kg/m. Lungs wheezing to auscultation, patient cannot whistle not hold her breath longer than 12 seconds-   Generalized: Well developed, in no acute distress . She is fatigued, she is stooped, SOB.   Neurological examination - alert  Mentation: Ms Burns is developmentally delayed, unable to live alone, she is not fully oriented to place and date.  Cranial nerve : No change in taste or smell reported.  Pupils were equal round reactive to light.  Extraocular movements were full, ptosis bilaterally. Head droopy, left leaning.  Facial sensation and strength were normal. Uvula in midline.  Head turning and shoulder shrug  were weak.  Motor: The motor testing reveals reduced strength of all 4 extremities.  Symmetric motor tone is noted throughout.  Sensory:  intact to soft touch on all 4 extremities. No evidence of extinction is noted.  Coordination: good finger-nose-fingerbilaterally.  Gait and station: Gait is stooped, she is SOB, her feet are shuffling, turning with 5 steps, unsteady. Leaning to the left.   Reflexes: Deep tendon reflexes are symmetric bilaterally.   DIAGNOSTIC DATA (LABS, IMAGING, TESTING) - I reviewed patient records, labs, notes, testing and imaging myself where available.  ED records, chemotherapy and radiation reports.      Lab Results  Component Value Date   WBC 10.9 (H) 11/04/2019   HGB 13.6 11/04/2019   HCT 43.1  11/04/2019   MCV 93.9 11/04/2019   PLT 238 11/04/2019      Component Value Date/Time   NA 133 (L) 11/04/2019 1725   NA 144 04/04/2015 1600   NA 137 02/21/2012  1828   K 3.5 11/04/2019 1725   K 4.3 02/21/2012 1828   CL 96 (L) 11/04/2019 1725   CL 104 02/21/2012 1828   CO2 26 11/04/2019 1725   CO2 25 02/21/2012 1828   GLUCOSE 213 (H) 11/04/2019 1725   GLUCOSE 145 (H) 02/21/2012 1828   BUN 21 11/04/2019 1725   BUN 8 04/04/2015 1600   BUN 8 02/21/2012 1828   CREATININE 0.99 11/04/2019 1725   CREATININE 1.14 02/21/2012 1828   CALCIUM 8.8 (L) 11/04/2019 1725   CALCIUM 9.0 02/21/2012 1828   PROT 7.0 10/24/2019 0543   PROT 6.0 04/04/2015 1600   PROT 6.4 04/25/2011 0854   ALBUMIN 2.8 (L) 10/24/2019 0543   ALBUMIN 4.0 04/04/2015 1600   ALBUMIN 3.4 04/25/2011 0854   AST 30 10/24/2019 0543   AST 18 04/25/2011 0854   ALT 17 10/24/2019 0543   ALT 25 04/25/2011 0854   ALKPHOS 47 10/24/2019 0543   ALKPHOS 45 (L) 04/25/2011 0854   BILITOT 0.6 10/24/2019 0543   BILITOT 0.4 04/04/2015 1600   BILITOT 0.4 04/25/2011 0854   GFRNONAA 59 (L) 11/04/2019 1725   GFRNONAA 52 (L) 02/21/2012 1828   GFRAA >60 11/04/2019 1725   GFRAA >60 02/21/2012 1828   Lab Results  Component Value Date   CHOL 197 12/14/2018   HDL 36.80 (L) 12/14/2018   LDLCALC 110 (H) 08/06/2018   LDLDIRECT 122.0 12/14/2018   TRIG 210.0 (H) 12/14/2018   CHOLHDL 5 12/14/2018   Lab Results  Component Value Date   HGBA1C 5.4 12/14/2018   No results found for: [id number] Lab Results  Component Value Date   TSH 3.859 10/22/2019      ASSESSMENT : 35 minutes.  69 y.o. year old MRDD patient with history of MG, malignant thymoma, abnormal chest X ray and sepsis, pneumonia and kidney cancer.   MG exacerbation in the setting of cancer therapy-  Madison had been doing very well on Mestinon and resume methotrexate. The therapy was reduced to improve her immune response under the cancer treatments which lasted into May  2021.   Up to July her physical appearance was unchanged, strong.   We will continue  Mestinon 60 mg 3 times daily as prescribed.  She needs to restart on MTX or prednisone to control AChAb output. Respiratory system can probably improve quicker with some prednisone at this time.  She will need a CMET, CBC diff and folic acid level next visit.   OSA : She was using CPAP gain compliantly- doing well in terms of OSA.   I spent 35 minutes with the patient. I will ask her to follow up in 3 month with NP.  [name], MD    11/15/2019, 10:43 AM [facility] [address] [place], [place] [address] [phone]

## 2019-11-15 NOTE — Telephone Encounter (Signed)
I have seen Madison Burns today, she seemed to have an acute exacerbation of M.Gravis in the setting of only recently finsihed chemotherapy. I had d/c her MTX as it may make her to infection prone, but I am convinced now that she needs to resume it- bilateral ptosis, head drop to the left, left leaning posture, shuffling gait and SOB.  We will follow with her sister if she improves . I also put her on a short steroid taper as she is still wheezing. CD

## 2019-11-15 NOTE — Patient Instructions (Signed)
Myasthenia Gravis Myasthenia gravis (MG) is a long-term (chronic) condition that causes weakness in the muscles you can control (voluntary muscles). MG can affect any voluntary muscle. The muscles most often affected are the ones that control:  Eye movement.  Facial movements.  Swallowing. MG is a disease in which the body's disease-fighting system (immune system) attacks its own healthy tissues (autoimmune disease). When you have MG, your immune system makes proteins (antibodies) that block the chemical (acetylcholine) that your body needs to send nerve signals to your muscles. This causes muscle weakness. What are the causes? The exact cause of MG is not known. What increases the risk? The following factors may make you more likely to develop this condition:  Having an enlarged thymus gland. The thymus gland is located under the breastbone. It makes certain cells for the immune system.  Having a family history of MG. What are the signs or symptoms? Symptoms of MG may include:  Drooping eyelids.  Double vision.  Muscle weakness that gets worse with activity and gets better after rest.  Difficulty walking.  Trouble chewing and swallowing.  Trouble making facial expressions.  Slurred speech.  Weakness of the arms, hands, and legs. Sudden, severe difficulty breathing (myasthenic crisis) may develop after having:  An infection.  A fever.  A bad reaction to a medicine. Myasthenic crisis requires emergency breathing support. Sometimes symptoms of MG go away for a while (remission) and then come back later. How is this diagnosed? This condition may be diagnosed based on:  Your symptoms and medical history.  A physical exam.  Blood tests.  Tests of your muscle strength and function.  Imaging tests, such as a CT scan or an MRI. How is this treated? The goal of treatment is to improve muscle strength. Treatment may include:  Taking medicine.  Making lifestyle  changes that focus on saving your energy.  Doing physical therapy to gain strength.  Having surgery to remove the thymus gland (thymectomy). This may result in a long remission for some people.  Having a procedure to remove the acetylcholine antibodies (plasmapheresis).  Getting emergency breathing support, if you experience myasthenic crisis. If you experience remission, you may be able to stop treatment and then resume treatment when your symptoms return. Follow these instructions at home:   Take over-the-counter and prescription medicines only as told by your health care provider.  Get plenty of rest and sleep. Take frequent breaks to rest your eyes, especially when in bright light or working on a computer.  Maintain a healthy diet and a healthy weight. Work with your health care provider or a diet and nutrition specialist (dietitian) if you need help.  Do exercises as told by your health care provider or physical therapist.  Do not use any products that contain nicotine or tobacco, such as cigarettes and e-cigarettes. If you need help quitting, ask your health care provider.  Prevent infections by: ? Washing your hands often with soap and water. If soap and water are not available, use hand sanitizer. ? Avoiding contact with other people who are sick. ? Avoiding touching your eyes, nose, and mouth. ? Cleaning surfaces in your home that are touched often using a disinfectant.  Keep all follow-up visits as told by your health care provider. This is important. Contact a health care provider if:  Your symptoms change or get worse, especially after having a fever or infection. Get help right away if:  You have trouble breathing. Summary  Myasthenia gravis (MG) is  a long-term (chronic) condition that causes weakness in the muscles you can control (voluntary muscles).  A symptom of MG is muscle weakness that gets worse with activity and gets better after rest.  Sudden, severe  difficulty breathing (myasthenic crisis) may develop after having an infection, a fever, or a bad reaction to a medicine.  The goal of treatment is to improve muscle strength. Treatment may include medicines, lifestyle changes, physical therapy, surgery, plasmapheresis, or emergency breathing support. This information is not intended to replace advice given to you by your health care provider. Make sure you discuss any questions you have with your health care provider. Document Revised: 02/16/2017 Document Reviewed: 02/16/2017 Elsevier Patient Education  2020 [employer].

## 2019-11-16 NOTE — Telephone Encounter (Signed)
Thank you for keeping me updated. I look forward to checking in on her in January.

## 2019-11-18 DIAGNOSIS — G4733 Obstructive sleep apnea (adult) (pediatric): Secondary | ICD-10-CM | POA: Diagnosis not present

## 2019-11-18 DIAGNOSIS — Z6839 Body mass index (BMI) 39.0-39.9, adult: Secondary | ICD-10-CM | POA: Diagnosis not present

## 2019-11-18 DIAGNOSIS — E669 Obesity, unspecified: Secondary | ICD-10-CM | POA: Diagnosis not present

## 2019-11-18 DIAGNOSIS — E785 Hyperlipidemia, unspecified: Secondary | ICD-10-CM | POA: Diagnosis not present

## 2019-11-18 DIAGNOSIS — G40909 Epilepsy, unspecified, not intractable, without status epilepticus: Secondary | ICD-10-CM | POA: Diagnosis not present

## 2019-11-18 DIAGNOSIS — I4891 Unspecified atrial fibrillation: Secondary | ICD-10-CM | POA: Diagnosis not present

## 2019-11-18 DIAGNOSIS — E039 Hypothyroidism, unspecified: Secondary | ICD-10-CM | POA: Diagnosis not present

## 2019-11-18 DIAGNOSIS — K589 Irritable bowel syndrome without diarrhea: Secondary | ICD-10-CM | POA: Diagnosis not present

## 2019-11-18 DIAGNOSIS — F329 Major depressive disorder, single episode, unspecified: Secondary | ICD-10-CM | POA: Diagnosis not present

## 2019-11-18 DIAGNOSIS — K219 Gastro-esophageal reflux disease without esophagitis: Secondary | ICD-10-CM | POA: Diagnosis not present

## 2019-11-18 DIAGNOSIS — I10 Essential (primary) hypertension: Secondary | ICD-10-CM | POA: Diagnosis not present

## 2019-11-18 DIAGNOSIS — C37 Malignant neoplasm of thymus: Secondary | ICD-10-CM | POA: Diagnosis not present

## 2019-11-18 DIAGNOSIS — Z8701 Personal history of pneumonia (recurrent): Secondary | ICD-10-CM | POA: Diagnosis not present

## 2019-11-18 DIAGNOSIS — Z85528 Personal history of other malignant neoplasm of kidney: Secondary | ICD-10-CM | POA: Diagnosis not present

## 2019-11-18 DIAGNOSIS — G7000 Myasthenia gravis without (acute) exacerbation: Secondary | ICD-10-CM | POA: Diagnosis not present

## 2019-11-20 ENCOUNTER — Telehealth: Payer: Self-pay | Admitting: Internal Medicine

## 2019-11-20 ENCOUNTER — Encounter: Payer: Self-pay | Admitting: Internal Medicine

## 2019-11-20 NOTE — Assessment & Plan Note (Signed)
No increased abdominal pain reported.  Xray as outlined.  Having bowel movements.  Follow.

## 2019-11-20 NOTE — Assessment & Plan Note (Signed)
Oxygen saturation dropping when pt walks.  Have home health walk and record oxygen to see if qualifies for home oxygen.

## 2019-11-20 NOTE — Assessment & Plan Note (Signed)
Followed by oncology.  S/p chemo and XRT.  Recent scan - decrease in size.

## 2019-11-20 NOTE — Assessment & Plan Note (Signed)
Followed by urology.   

## 2019-11-20 NOTE — Assessment & Plan Note (Signed)
Diagnosed with afib in hospital.  Treated with cardizem drip and converted to oral cardizem.  Converted to SR in hospital.  Cardiology consulted.  Deferred anticoagulation.  Felt to be related to sepsis and acute infection.  Follow.

## 2019-11-20 NOTE — Assessment & Plan Note (Signed)
Recently admitted with pneumonia as outlined.  Will need f/u xray/scan.

## 2019-11-20 NOTE — Telephone Encounter (Signed)
We had talked about getting home health to walk her and see if qualifies for oxygen (home oxygen).  My understanding - she already had home health scheduled.  Where are we in getting this arranged.  Do I need to do anything more?  Thanks

## 2019-11-20 NOTE — Assessment & Plan Note (Signed)
Followed by neurology.   

## 2019-11-20 NOTE — Assessment & Plan Note (Signed)
Followed by neurology.  Recently hospitalized as outlined.  Treated with IVIG.  Due to f/u with neurology soon to discussed restarting MTX. Concern some of her symptoms related to MG flare.

## 2019-11-20 NOTE — Assessment & Plan Note (Addendum)
Blood pressure has been doing well. On oral cardizem.  Follow pressures and metabolic panel.  

## 2019-11-20 NOTE — Assessment & Plan Note (Signed)
Declines cholesterol medication.  Low cholesterol diet and exercise.  Follow lipid panel.   

## 2019-11-20 NOTE — Assessment & Plan Note (Signed)
Using cpap regularly.  Continues to f/u with neurology.

## 2019-11-20 NOTE — Assessment & Plan Note (Signed)
On thyroid replacement.  Follow tsh.  

## 2019-11-23 DIAGNOSIS — F329 Major depressive disorder, single episode, unspecified: Secondary | ICD-10-CM | POA: Diagnosis not present

## 2019-11-23 DIAGNOSIS — G40909 Epilepsy, unspecified, not intractable, without status epilepticus: Secondary | ICD-10-CM | POA: Diagnosis not present

## 2019-11-23 DIAGNOSIS — C37 Malignant neoplasm of thymus: Secondary | ICD-10-CM | POA: Diagnosis not present

## 2019-11-23 DIAGNOSIS — K589 Irritable bowel syndrome without diarrhea: Secondary | ICD-10-CM | POA: Diagnosis not present

## 2019-11-23 DIAGNOSIS — Z6839 Body mass index (BMI) 39.0-39.9, adult: Secondary | ICD-10-CM | POA: Diagnosis not present

## 2019-11-23 DIAGNOSIS — E785 Hyperlipidemia, unspecified: Secondary | ICD-10-CM | POA: Diagnosis not present

## 2019-11-23 DIAGNOSIS — I4891 Unspecified atrial fibrillation: Secondary | ICD-10-CM | POA: Diagnosis not present

## 2019-11-23 DIAGNOSIS — I10 Essential (primary) hypertension: Secondary | ICD-10-CM | POA: Diagnosis not present

## 2019-11-23 DIAGNOSIS — E669 Obesity, unspecified: Secondary | ICD-10-CM | POA: Diagnosis not present

## 2019-11-23 DIAGNOSIS — Z85528 Personal history of other malignant neoplasm of kidney: Secondary | ICD-10-CM | POA: Diagnosis not present

## 2019-11-23 DIAGNOSIS — G7000 Myasthenia gravis without (acute) exacerbation: Secondary | ICD-10-CM | POA: Diagnosis not present

## 2019-11-23 DIAGNOSIS — K219 Gastro-esophageal reflux disease without esophagitis: Secondary | ICD-10-CM | POA: Diagnosis not present

## 2019-11-23 DIAGNOSIS — G4733 Obstructive sleep apnea (adult) (pediatric): Secondary | ICD-10-CM | POA: Diagnosis not present

## 2019-11-23 DIAGNOSIS — E039 Hypothyroidism, unspecified: Secondary | ICD-10-CM | POA: Diagnosis not present

## 2019-11-23 DIAGNOSIS — Z8701 Personal history of pneumonia (recurrent): Secondary | ICD-10-CM | POA: Diagnosis not present

## 2019-11-24 ENCOUNTER — Other Ambulatory Visit: Payer: Self-pay | Admitting: *Deleted

## 2019-11-24 DIAGNOSIS — E785 Hyperlipidemia, unspecified: Secondary | ICD-10-CM | POA: Diagnosis not present

## 2019-11-24 DIAGNOSIS — C37 Malignant neoplasm of thymus: Secondary | ICD-10-CM | POA: Diagnosis not present

## 2019-11-24 DIAGNOSIS — I4891 Unspecified atrial fibrillation: Secondary | ICD-10-CM | POA: Diagnosis not present

## 2019-11-24 DIAGNOSIS — Z6839 Body mass index (BMI) 39.0-39.9, adult: Secondary | ICD-10-CM | POA: Diagnosis not present

## 2019-11-24 DIAGNOSIS — Z85528 Personal history of other malignant neoplasm of kidney: Secondary | ICD-10-CM | POA: Diagnosis not present

## 2019-11-24 DIAGNOSIS — Z8701 Personal history of pneumonia (recurrent): Secondary | ICD-10-CM | POA: Diagnosis not present

## 2019-11-24 DIAGNOSIS — F329 Major depressive disorder, single episode, unspecified: Secondary | ICD-10-CM | POA: Diagnosis not present

## 2019-11-24 DIAGNOSIS — I10 Essential (primary) hypertension: Secondary | ICD-10-CM | POA: Diagnosis not present

## 2019-11-24 DIAGNOSIS — G7000 Myasthenia gravis without (acute) exacerbation: Secondary | ICD-10-CM | POA: Diagnosis not present

## 2019-11-24 DIAGNOSIS — G40909 Epilepsy, unspecified, not intractable, without status epilepticus: Secondary | ICD-10-CM | POA: Diagnosis not present

## 2019-11-24 DIAGNOSIS — K589 Irritable bowel syndrome without diarrhea: Secondary | ICD-10-CM | POA: Diagnosis not present

## 2019-11-24 DIAGNOSIS — E669 Obesity, unspecified: Secondary | ICD-10-CM | POA: Diagnosis not present

## 2019-11-24 DIAGNOSIS — K219 Gastro-esophageal reflux disease without esophagitis: Secondary | ICD-10-CM | POA: Diagnosis not present

## 2019-11-24 DIAGNOSIS — E039 Hypothyroidism, unspecified: Secondary | ICD-10-CM | POA: Diagnosis not present

## 2019-11-24 DIAGNOSIS — G4733 Obstructive sleep apnea (adult) (pediatric): Secondary | ICD-10-CM | POA: Diagnosis not present

## 2019-11-24 NOTE — Patient Outreach (Signed)
Madison Burns [name]) Care Management  11/24/2019  [name] 1950-12-10 [id number]  Second outreach to request engagement with HTA NP for care management needs.  Was able to speak with [name] today, pt's sister and only caregiver.  Pt recently had a hospitalization for dysnea, No COVID, Hs of MG which exacerbated during this time.  Sister reports [facility] is provided SN care PT, OT, Nursing. The bath aid did not work out but is needed. Will request HTA custodial assistance.  Pt MG sxs are dissipating and she is doing much better.   Sister, [name], reports she has her own share of health issues that limit her ability to care completely for her sister (DM, neuropathy, risk for falls, benign brain tumors.  She is designated as Facilities manager. She recognizes that plans need to made for her sisters increasing needs and her inability to provide care in the future.  The [facility] LCSW will be visiting this evening. NP asked that she share my information. That we have discussed [facility] today. NP to call [facility] and request an outreach to [place].  NP to follow up in one week.  [name]. [name], MSN, [facility] Gerontological Nurse Practitioner [facility] Care Management [phone]

## 2019-11-24 NOTE — Patient Outreach (Addendum)
[facility]) Care Management  11/24/2019  Madison Burns 1950/12/03 [id number]  Second outreach to request engagement with HTA NP for care management needs.  Was able to speak with Madison Burns today, pt's sister and only caregiver.  Pt recently had a hospitalization for dysnea, No COVID, Hs of MG which exacerbated during this time.  Sister reports [facility] is provided SN care PT, OT, Nursing. The bath aid did not work out but is needed. Will request HTA custodial assistance.  Pt MG sxs are dissipating and she is doing much better.   Sister, [name], reports she has her own share of health issues that limit her ability to care completely for her sister (DM, neuropathy, risk for falls, benign brain tumors.  She is designated as Facilities manager. She recognizes that plans need to made for her sisters increasing needs and her inability to provide care in the future.  The [facility] LCSW will be visiting this evening. NP asked that she share my information. That we have discussed [facility] today. NP to call [facility] and request an outreach to [name].  NP to follow up in one week.  [name]. [name], MSN, GNP-BC Gerontological Nurse Practitioner [facility] Care Management [phone]  Have sent an Epic inbox message to Dr. Nicki Reaper requesting custodial care order and called [facility] in [facility], left a message for [name] to please send information to this family. Proved pt name, sister name, address and phone number.  [name]. [name], MSN, [facility] Gerontological Nurse Practitioner [facility] Care Management [phone]

## 2019-11-25 DIAGNOSIS — Z85528 Personal history of other malignant neoplasm of kidney: Secondary | ICD-10-CM | POA: Diagnosis not present

## 2019-11-25 DIAGNOSIS — K589 Irritable bowel syndrome without diarrhea: Secondary | ICD-10-CM | POA: Diagnosis not present

## 2019-11-25 DIAGNOSIS — E785 Hyperlipidemia, unspecified: Secondary | ICD-10-CM | POA: Diagnosis not present

## 2019-11-25 DIAGNOSIS — K219 Gastro-esophageal reflux disease without esophagitis: Secondary | ICD-10-CM | POA: Diagnosis not present

## 2019-11-25 DIAGNOSIS — E039 Hypothyroidism, unspecified: Secondary | ICD-10-CM | POA: Diagnosis not present

## 2019-11-25 DIAGNOSIS — G7000 Myasthenia gravis without (acute) exacerbation: Secondary | ICD-10-CM | POA: Diagnosis not present

## 2019-11-25 DIAGNOSIS — E7800 Pure hypercholesterolemia, unspecified: Secondary | ICD-10-CM | POA: Diagnosis not present

## 2019-11-25 DIAGNOSIS — G40909 Epilepsy, unspecified, not intractable, without status epilepticus: Secondary | ICD-10-CM | POA: Diagnosis not present

## 2019-11-25 DIAGNOSIS — E669 Obesity, unspecified: Secondary | ICD-10-CM | POA: Diagnosis not present

## 2019-11-25 DIAGNOSIS — Z8701 Personal history of pneumonia (recurrent): Secondary | ICD-10-CM | POA: Diagnosis not present

## 2019-11-25 DIAGNOSIS — F329 Major depressive disorder, single episode, unspecified: Secondary | ICD-10-CM | POA: Diagnosis not present

## 2019-11-25 DIAGNOSIS — G4733 Obstructive sleep apnea (adult) (pediatric): Secondary | ICD-10-CM | POA: Diagnosis not present

## 2019-11-25 DIAGNOSIS — I4891 Unspecified atrial fibrillation: Secondary | ICD-10-CM | POA: Diagnosis not present

## 2019-11-25 DIAGNOSIS — C37 Malignant neoplasm of thymus: Secondary | ICD-10-CM | POA: Diagnosis not present

## 2019-11-25 DIAGNOSIS — Z6839 Body mass index (BMI) 39.0-39.9, adult: Secondary | ICD-10-CM | POA: Diagnosis not present

## 2019-11-25 DIAGNOSIS — I10 Essential (primary) hypertension: Secondary | ICD-10-CM | POA: Diagnosis not present

## 2019-11-25 NOTE — Telephone Encounter (Signed)
Attempted to reach nurse at [facility]. Unable to leave message.

## 2019-11-30 ENCOUNTER — Telehealth: Payer: Self-pay | Admitting: Internal Medicine

## 2019-11-30 DIAGNOSIS — R0602 Shortness of breath: Secondary | ICD-10-CM

## 2019-11-30 DIAGNOSIS — G7001 Myasthenia gravis with (acute) exacerbation: Secondary | ICD-10-CM

## 2019-11-30 DIAGNOSIS — C37 Malignant neoplasm of thymus: Secondary | ICD-10-CM

## 2019-11-30 NOTE — Telephone Encounter (Signed)
Attempted to reach nurse at [facility]. Unable to leave message

## 2019-11-30 NOTE — Telephone Encounter (Signed)
[facility] informed me today that we need to put in order for custodial care to [facility] and then [facility] will receive the paperwork to complete this was unclear message received Friday as to we just needed to complete form when in reality an order needs to be completed to [facility] for custodial care.

## 2019-11-30 NOTE — Telephone Encounter (Signed)
I have placed the order for home health [facility]).  See previous message.  States needs for custodial care.  In the order, I had to select nursing, because it did not give me the option of custodial care.  I then specified in the comments what was needed.  Let me know if I need to do something else.  Thank you.

## 2019-12-01 ENCOUNTER — Ambulatory Visit: Payer: Self-pay | Admitting: *Deleted

## 2019-12-01 NOTE — Telephone Encounter (Signed)
YW! °

## 2019-12-01 NOTE — Telephone Encounter (Signed)
Thank you :)

## 2019-12-01 NOTE — Telephone Encounter (Signed)
Good morning!  Received and sent. [facility] will take pt and continue care.

## 2019-12-06 ENCOUNTER — Other Ambulatory Visit: Payer: Self-pay | Admitting: *Deleted

## 2019-12-06 NOTE — Patient Outreach (Signed)
[facility]) Care Management  12/06/2019  Madison Burns 12/23/1950 [id number]  Telephone outreach for follow up on HTA NP Referral.  No answer, left message requesting a return call.  [name]. [name], MSN, [facility] Gerontological Nurse Practitioner [facility] Care Management [phone]

## 2019-12-08 NOTE — Telephone Encounter (Signed)
Left message on nurse line to call me back. Called another number for [facility] but was told that I had reached the skilled side and needed to call [phone]. Left message for them to return my call.

## 2019-12-13 NOTE — Telephone Encounter (Signed)
Left message on nurse line at [facility] to call office.

## 2019-12-14 NOTE — Telephone Encounter (Signed)
I spoke with Madison Burns & she denies the need for O2 for [facility]. She states that a nurse comes out & checks on her & they also said her Oxygen was good. I told her I would let you know this information.

## 2019-12-14 NOTE — Telephone Encounter (Signed)
[facility] [place] office was taking care of patient but she has been DC as of 12/12/19 from home care so if we need 02 walking sats would need to come ito office.

## 2019-12-14 NOTE — Telephone Encounter (Signed)
Can this be done as a nurse visit?  Can see if pts sister wants to bring in to do this.

## 2019-12-15 NOTE — Telephone Encounter (Signed)
Noted.  Will let us know if problems.

## 2019-12-27 ENCOUNTER — Ambulatory Visit: Payer: Self-pay | Admitting: Neurology

## 2020-01-13 ENCOUNTER — Other Ambulatory Visit: Payer: Self-pay | Admitting: Internal Medicine

## 2020-02-09 ENCOUNTER — Other Ambulatory Visit: Payer: Self-pay | Admitting: Oncology

## 2020-02-16 ENCOUNTER — Other Ambulatory Visit: Payer: Self-pay | Admitting: Interventional Radiology

## 2020-02-22 ENCOUNTER — Encounter: Payer: Self-pay | Admitting: Family Medicine

## 2020-02-22 ENCOUNTER — Ambulatory Visit: Payer: PPO | Admitting: Family Medicine

## 2020-02-22 VITALS — BP 134/83 | HR 113 | Ht 63.0 in | Wt 193.6 lb

## 2020-02-22 DIAGNOSIS — F73 Profound intellectual disabilities: Secondary | ICD-10-CM

## 2020-02-22 DIAGNOSIS — G40309 Generalized idiopathic epilepsy and epileptic syndromes, not intractable, without status epilepticus: Secondary | ICD-10-CM | POA: Diagnosis not present

## 2020-02-22 DIAGNOSIS — G4733 Obstructive sleep apnea (adult) (pediatric): Secondary | ICD-10-CM

## 2020-02-22 DIAGNOSIS — G7000 Myasthenia gravis without (acute) exacerbation: Secondary | ICD-10-CM

## 2020-02-22 DIAGNOSIS — C37 Malignant neoplasm of thymus: Secondary | ICD-10-CM | POA: Diagnosis not present

## 2020-02-22 NOTE — Progress Notes (Signed)
PATIENT: Madison Burns DOB: [birth year]  REASON FOR VISIT: follow up HISTORY FROM: patient  Chief Complaint  Patient presents with  . Follow-up    Stable at this time.   . room 2    Here with sister      HISTORY OF PRESENT ILLNESS: Today 02/22/20   Madison Burns returns today for follow up of MG. She was last seen by Dr Dohmeier in 10/2019 in the setting of MG exacerbation d/t caner treatments. Mestinon was continued TID. Mtx 10mg  weekly resumed and prednisone taper given. Her sister, Madison Burns, reports that she was nearly back to baseline over night. She is doing very well today. She is tolerating medications well. She continue to follow up closely with oncology and plans to have office visit this month.  She continues CPAP nightly and is doing well. No concerns today.   History (copied from previous notes)  11/15/2019 CD: Madison Burns is a 70 y.o. female with mental retardation, living with her sister. [name] has now been diagnosed with renal cell cancer and her Myasthenia has taken a turn for the worse. She is presenting with ptosis , neck weakness and left shoulder slump. She is no longer able to walk unassisted, presents stooped. Her voice is hoarse.  She was last seen by [name] , NP on 09-06-2019.  She also has developed wheezing in the meantime but she last saw Tacha Manni she presented much less impaired.  In the meantime which she had called our office.  She has completed her chemoradiation therapy and she is certainly has a weaker immune system she also developed pneumonia and sepsis following her cancer treatment and surgery.  She was seen at the [facility] emergency room just 10 days ago on 9-17 21 with breathing difficulties shortness of breath thought to be due to left-sided pneumonia and possible myasthenia gravis exacerbation.  She underwent treatment with antibiotics and IVIG and ultimately was discharged after week.  She gets short of breath now with  exertion still, sometimes she is wheezing. She is sleeping in a hospital bed, and she cannot breath  easily- has orthopnea. This bed is adjustable and helps her shortness of breath.   Since the patient was discharged from the hospital she has resumed compliant use of her CPAP and would now be 100% for the time since discharge.  Average use at time 9 hours 2 minutes, she is above average sleepy the CPAP is set to 5 cmH2O with 3 cm EPR and her AHI is 2.2 which is an excellent resolution of this otherwise mild apnea which was more classified as a high apnea.  I am worried that she is so weak at this time that she may not participate well in physical therapy or gait stabilization but I will wait if she has regained some of her strength and respiratory capacity in the next 3 months so that we could do some physical therapy as well.  09/06/2019 ALL: Madison Burns is a 70 y.o. female here today for follow up for MG. She continues mestinon TID. Methotrexate discontinued when she started chemo. She feels that symptoms are stable. Her sister has noted once or twice where her left eye will seem to be "droopy" at night when she is tired. She did have several episodes where she got strangled during chemo therapy. She sometimes will take larger bites of food and will eat fast. Her sister is helping to monitor this. Renal cell- tumor was "frozen"  with urology. Thymoma- had chemo and radiation. Has a PET scan tomorrow. She seems more interactive since. She is able to answer more complicated questions. No falls. No vision changes/double vision. She has had an occasional headache, aborted with tylenol. She has had mild tingling of her feet following chemo but does not feel it is consistent or worsening. Sleep apnea well managed on CPAP. Has not had a seizure in 9 years, not on AED.   HISTORY: (copied from Dr Edwena Felty note on 03/09/2019)  Madison Burns is a 70 y.o. female with mental retardation, living with her  sister. [name] has been on medication for myasthenia gravis, she had lost some weight since eating mostly at home during the pandemic.  She still has a good appetite no trouble swallowing she had no falls and no changes in gait.  No taking any seizure medicine, she has continued Mestinon and methotrexate for myasthenia gravis and it was felt that she was fairly stable.   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 This would be her third tumor.   IMPRESSION: 1. Apparent subtle 3.2 cm homogeneously hypoenhancing lesion in the posterior interpolar left kidney. Imaging features raise concern for papillary type renal cell carcinoma. Abdominal MRI with and without contrast recommended to further evaluate. 2. Bilateral adrenal nodules. These could also be further assessed at the time of follow-up MRI. 3. No specific findings to explain the patient's history of periumbilical pain. 4. Tiny gas bubble identified in the urinary bladder, presumably from recent instrumentation. In the absence of instrumentation, infection would be a consideration. 5. Insert [name] 6. Left colonic diverticulosis without diverticulitis.  She has been followed here by me for OSA- for obstructive sleep apnea and has been a very compliant CPAP user V visit for sleep apnea is not to do until September 2021 with Madison Burns.    Madison Burns, 11-03-2018 here today for follow up of MG, seizure, and OSA on CPAP. She is here today with her sister , Madison Burns who aids in history. She continues mestinon and methotrexate for MG and feels symptoms are fairly stable. No vision changes, no double vision. No new or worsening weakness. She has  lost some weight as they are eating at home more since pandemic. She has a good appetite. No trouble swallowing. No changes in gait or falls. She is able to perform ADL's. She lives with her sister. She has not taken seizure medication in many years with no seizure activity noted. No adverse effects or worsening symptoms with decreased dose of methotrexate.   Compliance report dated 10/03/2018 through 11/01/2018 reveals that she is using CPAP nightly for compliance 100%.  She is using CPAP greater than 4 hours every night.  Average usage is 9 hours and 57 minutes.  AHI is 1.9 on 5 cm of water.  There is no significant leak.  HISTORY: (copied from [employer] note on 01/05/2018)  HISTORY CDRebecca H Burns a 70 y.o.femaleseen here as a referral from Dr. Essie Hart Myasthenia gravis, per special request of the patient's sister .  Madison Burns carries a diagnosis of myasthenia gravis associated with difficulties swallowing,  with a decreased level of energy a high degree of fatigue sometimes best interest in activities facial droop eyelid droop sleepiness snoring easy bruising, coughing, trouble swallowing she has a mild hoarseness but not as significant dysphonia and incontinence.  Her family and the patient looking for a maintenance neurologist to keep her myasthenia under control. The patient has been diagnosed and treated by Dr Carlis Abbott in [place]- until his death.She has been followed by Dr. Melrose Nakayama at the [facility] after following Dr. Paulla Dolly for 18 month .  The patient was diagnosed in 2011 by dr Carlis Abbott-  with myasthenia gravis. She was hospitalized with myasthnic crisis Jan 27 2010- through Januery 14th 2012. She was send to rehabilitation after that, by 03-2010. [name]  Her sister noticed a decline in cognitive skills at that time.  She has noted some core weakness- MG , no ptosis, no double vision.    She   is sleeping 12 hours easily per day, from 10 Pm to 8 AM. She has a droopy  right eye.  She missed frequently the 3rd. Mestinon.  She has been followed by Dr. Melrose Nakayama at the [facility] clinic after following Dr. Paulla Dolly . Again, her first treatment had been prednisone and this was just weaned off by September 2015. The patient carries a diagnosis of hypercholesterolemia. She also has a past surgical history of appendectomy in 1999, hysterectomy in 2004 and the melanoma was removed in 2014 from the right side of the neck close to the TM joint.  Ms. Schwark is single without children, Lives with her sister now used to live with her mother. She is MRDD, she left school in 7th grade, went to a special facility until her GED, [employer]. She used to work at a [employer] ,later in a vocational trade.   Ms. Joss is taking methotrexate to control them myasthenia she is also on Mestinon 60 mg 3 times a day she takes Detrol for urinary continence at 4 mg in the morning and she takes Risperdal 2 mg I mouth at bedtime. She is also treated for hypothyroidism with levothyroxine at 50 g daily. The methotrexate is given at 8 tablets once a week. Until last September 2015 the patient had been on prednisone. She has not had labs drawn for the last 5 month, while continuing on MTX.  4/19/17Today's visit is solely dedicated to the sleep disorder part of her neurological care Madison Burns underwent a sleep apnea test as a home sleep test, the AHI was 20.7 the lowest desaturation was 61 with 309 minutes of desaturation. This of course a possibility that in a home sleep test the hypoxemia is overestimated or artifact related but she does have a significant amount of obstructive sleep apnea as well. For this reason and because of her underlying condition of myasthenia gravis the patient was asked to undergo an attended CPAP titration. This took place on 04-19-14 the AHI was 0.3 on CPAP of only 5 cm water. Her SPO2 nadir rose to 91% no added oxygen was needed. I prescribed a ResMed air-fluid P 10 in  medium size and an auto CPAP from 4 through 8 cm water. The patient states that she has not received CPAP machine that she had an outstanding bill was advanced home care which may be the reason.  She is on Medicare/ Medicaid and a sleep study cannot be older than 6 months to allow her to obtain a machine - so I will have to repeat her sleep study.  Her Epworth sleepiness score remains high at 17 points, her depression score at 3.5, fatigue severity questionnaire at 38 points. MRDD - needs assistence with CPAP, certainly would need a desensitization session with [facility] in [place].    UPDATE 10/19/2017CMMs. Madison Burns, 70 year old female returns for follow-up with her mother. She has a history of obstructive sleep apnea on CPAP compliance report today is 100% for 30 days average usage 10 hours 6 minutes AHI is 1.1. No leaks. In addition she has myasthenia gravis and is currently on Mestinon and methotrexate. She needs refills. No recent falls. She has no facial weakness no trouble swallowing. She also has a history of seizure disorder but has not had seizures in many years and has been weaned off of her seizure medication. She returns for reevaluation  02/23/17 CDI have the pleasure of following Ms. Berenson for her obstructive sleep apnea,and she has again proven to be a highly compliant CPAP user. Her CPAP compliance is 93% with 9 hours and 9 minutes on average daily use. CPAP is set at only 5 cm water pressure and allows for reduction of the AHI to 0.6. No central apneas are emerging and she has very few air leaks. Her baseline AHI in 02-2015 was 20.8/hr."She reports dreaming more- good dreams". She sleeps well. She has trouble with swallowing solid foods. In addition she has not had significant muscle weakness, no falls, but she does feel easily fatigued and sleepy. She endorsed the Epworth score today on 11 and the fatigue severity at 54 points. 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 She often cried during this task.   UPDATE11/19/2019CMMs.Madison Burns,70 year old female returns for follow-up with history of obstructive sleep apnea on CPAP. She also has a history of myasthenia gravis and seizure disorder.She is no longer on seizure medication and no seizures in many years. CPAP compliance dated 12/05/2017-01/03/2018 shows compliance greater than 4 hours at 100%. Set pressure 5 cm. Average usage 10 hours 29 minutes. No leak. AHI 1.3. She remains on Mestinon and methotrexate for her myasthenia gravis which has been stable. She complains of intermittent ptosis of the right eye when fatigued. This is not evident today. She has no double vision. Appetite is good and she sleeps well she returns for reevaluation    REVIEW OF SYSTEMS: Out of a complete 14 system review of symptoms, the patient complains only of the following symptoms, headaches, tingling of feet and all other reviewed systems are negative.  ALLERGIES: No Known Allergies  HOME MEDICATIONS: Outpatient Medications Prior to Visit  Medication Sig Dispense Refill  . Ascorbic Acid (VITAMIN C) 1000 MG tablet Take 1,000 mg by mouth at bedtime.    . B Complex-C (B-COMPLEX WITH VITAMIN C) tablet Take 1 tablet by mouth every evening.    . benzonatate (TESSALON) 100 MG capsule TAKE 1 CAPSULE BY MOUTH TWICE DAILY AS NEEDED COUGH 90 capsule 0  . Biotin 10000 MCG TABS Take 10,000 mcg by mouth daily at 2 PM. (1430)    . Calcium-Phosphorus-Vitamin D (CITRACAL +D3 PO) Take 1 tablet by mouth daily.    [name] diltiazem (CARDIZEM CD) 180 MG 24 hr capsule Take 1 capsule (180 mg total) by mouth daily. 30 capsule 2  . DULoxetine (CYMBALTA) 30 MG capsule Take 1 capsule (30 mg total) by mouth daily. 30 capsule 5  . folic acid (FOLVITE) 1 MG tablet 2 mg every day po 60 tablet 5  . Garlic 123XX123 MG CAPS Take  1,000 mg by mouth in the morning, at noon, and at bedtime. (0800, 1430, 2000)    . ketoconazole  (NIZORAL) 2 % cream SMARTSIG:1 Application Topical 1 to 2 Times Daily    . levothyroxine (SYNTHROID) 75 MCG tablet TAKE 1 TABLET BY MOUTH DAILY ON AN EMPTYSTOMACH. WAIT 30 MINUTES BEFORE TAKING OTHER MEDS (Patient taking differently: Take 75 mcg by mouth daily before breakfast.) 90 tablet 1  . methotrexate 2.5 MG tablet Caution:Chemotherapy. Protect from light.take 4 tab once a week po. Take folic acid with it. 16 tablet 5  . pantoprazole (PROTONIX) 40 MG tablet Take 40 mg by mouth daily.     . Probiotic Product (PROBIOTIC MULTI-ENZYME PO) Take 1 capsule by mouth in the morning and at bedtime. Probiotic Multi-Enzyme Digestive Formula    . pyridostigmine (MESTINON) 60 MG tablet TAKE 1 TABLET BY MOUTH AT  8 AM, ONE AT LUNCH AND ONE AT 8PM 90 tablet 6  . risperiDONE (RISPERDAL) 1 MG tablet Take 1 mg by mouth at bedtime.     . sucralfate (CARAFATE) 1 g tablet Take 1 g by mouth 2 (two) times daily. (0800 & 1700)    . tolterodine (DETROL LA) 4 MG 24 hr capsule TAKE 1 CAPSULE BY MOUTH ONCE EVERY MORNING 90 capsule 1  . vitamin E 180 MG (400 UNITS) capsule Take 400 Units by mouth daily at 2 PM. 1430    . predniSONE (DELTASONE) 20 MG tablet Take 3 tab for 2 days, 1 tab for 2 days , 1 tab for 6 days. (Patient not taking: Reported on 02/22/2020) 15 tablet 0   No facility-administered medications prior to visit.    PAST MEDICAL HISTORY: Past Medical History:  Diagnosis Date  . Cancer of kidney ([place]) 2021  . Complication of anesthesia    Myasthenia gravis  . GERD (gastroesophageal reflux disease)   . History of seizure disorder   . Hypercholesterolemia   . Hypertension    no longer has it. wt loss  . Hypothyroidism   . Irritable bowel syndrome   . Myasthenia gravis ([place]) 2011  . OSA on CPAP   . Skin cancer 2017   Melanoma on neck  . Thymoma, malignant ([place])     PAST SURGICAL HISTORY: Past Surgical History:  Procedure Laterality Date  . ABDOMINAL HYSTERECTOMY  1990  . APPENDECTOMY  1999  .  CHOLECYSTECTOMY  2013  . COLONOSCOPY WITH PROPOFOL N/A 06/30/2017   Procedure: COLONOSCOPY WITH PROPOFOL;  Surgeon: Toledo, Benay Pike, MD;  Location: [facility] ENDOSCOPY;  Service: Gastroenterology;  Laterality: N/A;  . CYSTOSCOPY W/ URETERAL STENT PLACEMENT Left 08/17/2019   Procedure: CYSTOSCOPY WITH RETROGRADE PYELOGRAM/URETERAL STENT PLACEMENT;  Surgeon: Alexis Frock, MD;  Location: [facility];  Service: Urology;  Laterality: Left;  30 MINS  . ESOPHAGOGASTRODUODENOSCOPY (EGD) WITH PROPOFOL N/A 06/30/2017   Procedure: ESOPHAGOGASTRODUODENOSCOPY (EGD) WITH PROPOFOL;  Surgeon: Toledo, Benay Pike, MD;  Location: [facility] ENDOSCOPY;  Service: Gastroenterology;  Laterality: N/A;  . IR RADIOLOGIST EVAL & MGMT  06/28/2019  . IR RADIOLOGIST EVAL & MGMT  09/14/2019  . RADIOFREQUENCY ABLATION Left 08/17/2019   Procedure: LEFT RENAL CRYO ABLATION;  Surgeon: Jacqulynn Cadet, MD;  Location: [facility];  Service: Anesthesiology;  Laterality: Left;  . TONSILLECTOMY  1959    FAMILY HISTORY: Family History  Problem Relation Age of Onset  . Colon cancer Maternal Grandfather        stomach/colon  . Heart disease Father        myocardial infarction  .  Hyperlipidemia Father   . Hyperlipidemia Sister   . Diabetes Sister   . Bone cancer Other        cousin  . Alzheimer's disease Mother        maternal aunts  . Skin cancer Mother   . Myasthenia gravis Brother        ocular  . Skin cancer Brother   . Stroke Paternal Grandfather   . Breast cancer Neg Hx     SOCIAL HISTORY: Social History   Socioeconomic History  . Marital status: Single    Spouse name: Not on file  . Number of children: 0  . Years of education: Special Ed  . Highest education level: Not on file  Occupational History  . Occupation: Unemployed  Tobacco Use  . Smoking status: Never Smoker  . Smokeless tobacco: Never Used  Vaping Use  . Vaping Use: Never used  Substance and Sexual Activity  . Alcohol use: No    Alcohol/week: 0.0 standard  drinks  . Drug use: No  . Sexual activity: Not Currently  Other Topics Concern  . Not on file  Social History Narrative   06/23/17 lives with sister, Harriett Sine   Regular exercise-no   Caffeine Use-yes   Social Determinants of Health   Financial Resource Strain: Not on file  Food Insecurity: Not on file  Transportation Needs: Not on file  Physical Activity: Not on file  Stress: Not on file  Social Connections: Not on file  Intimate Partner Violence: Not on file      PHYSICAL EXAM  Vitals:   02/22/20 1406  BP: 134/83  Pulse: (!) 113  Weight: 193 lb 9.6 oz (87.8 kg)  Height: 5\' 3"  (1.6 m)   Body mass index is 34.29 kg/m.  Generalized: Well developed, in no acute distress  Cardiology: normal rate and rhythm, no murmur noted Respiratory: clear to auscultation bilaterally  Neurological examination  Mentation: Alert, not fully oriented but able to assist with some history taking, developmentally delayed.  Cranial nerve II-XII: Pupils were equal round reactive to light. No ptosis noted. Extraocular movements were full, visual field were full on confrontational test. Facial sensation and strength were normal.  Head turning and shoulder shrug  were normal and symmetric. Motor: The motor testing reveals 5 over 5 strength of all 4 extremities. Good symmetric motor tone is noted throughout.  Gait and station: Gait is normal. Stooped posture, tandem not attempted.  DTR: symmetric bilaterally, brisk in bilateral upper extremities.    DIAGNOSTIC DATA (LABS, IMAGING, TESTING) - I reviewed patient records, labs, notes, testing and imaging myself where available.  No flowsheet data found.   Lab Results  Component Value Date   WBC 10.9 (H) 11/04/2019   HGB 13.6 11/04/2019   HCT 43.1 11/04/2019   MCV 93.9 11/04/2019   PLT 238 11/04/2019      Component Value Date/Time   NA 133 (L) 11/04/2019 1725   NA 144 04/04/2015 1600   NA 137 02/21/2012 1828   K 3.5 11/04/2019 1725   K 4.3  02/21/2012 1828   CL 96 (L) 11/04/2019 1725   CL 104 02/21/2012 1828   CO2 26 11/04/2019 1725   CO2 25 02/21/2012 1828   GLUCOSE 213 (H) 11/04/2019 1725   GLUCOSE 145 (H) 02/21/2012 1828   BUN 21 11/04/2019 1725   BUN 8 04/04/2015 1600   BUN 8 02/21/2012 1828   CREATININE 0.99 11/04/2019 1725   CREATININE 1.14 02/21/2012 1828   CALCIUM 8.8 (  L) 11/04/2019 1725   CALCIUM 9.0 02/21/2012 1828   PROT 7.0 10/24/2019 0543   PROT 6.0 04/04/2015 1600   PROT 6.4 04/25/2011 0854   ALBUMIN 2.8 (L) 10/24/2019 0543   ALBUMIN 4.0 04/04/2015 1600   ALBUMIN 3.4 04/25/2011 0854   AST 30 10/24/2019 0543   AST 18 04/25/2011 0854   ALT 17 10/24/2019 0543   ALT 25 04/25/2011 0854   ALKPHOS 47 10/24/2019 0543   ALKPHOS 45 (L) 04/25/2011 0854   BILITOT 0.6 10/24/2019 0543   BILITOT 0.4 04/04/2015 1600   BILITOT 0.4 04/25/2011 0854   GFRNONAA 59 (L) 11/04/2019 1725   GFRNONAA 52 (L) 02/21/2012 1828   GFRAA >60 11/04/2019 1725   GFRAA >60 02/21/2012 1828   Lab Results  Component Value Date   CHOL 197 12/14/2018   HDL 36.80 (L) 12/14/2018   LDLCALC 110 (H) 08/06/2018   LDLDIRECT 122.0 12/14/2018   TRIG 210.0 (H) 12/14/2018   CHOLHDL 5 12/14/2018   Lab Results  Component Value Date   HGBA1C 5.4 12/14/2018   No results found for: VITAMINB12 Lab Results  Component Value Date   TSH 3.859 10/22/2019       ASSESSMENT AND PLAN 70 y.o. year old female  has a past medical history of Cancer of kidney ([place]) ([id number]), Complication of anesthesia, GERD (gastroesophageal reflux disease), History of seizure disorder, Hypercholesterolemia, Hypertension, Hypothyroidism, Irritable bowel syndrome, Myasthenia gravis ([place]) (2011), OSA on CPAP, Skin cancer (2017), and Thymoma, malignant ([place]). here with     ICD-10-CM   1. Myasthenia gravis ([place])  G70.00   2. Thymoma, malignant ([place])  C37   3. Idiopathic profound intellectual disability  F73   4. Epilepsy, generalized, convulsive ([place])  G40.309   5. OSA  (obstructive sleep apnea)  G47.33     [name] is doing well today. She continues mestinon 60mg  TID and methotrexate 10mg  weekly and is tolerating medications well.  We will continue current treatment plan. Headaches and numbness/tingling seem to have resolved. Her sister will call us if symptoms seem to be worsening. No recent seizures. She will continue CPAP nightly. She was encouraged to continue healthy lifestyle habits. She will continue close follow up with her care team. She will follow up with [place] in 6 months, sooner if needed.    No orders of the defined types were placed in this encounter.    No orders of the defined types were placed in this encounter.     I spent 25 minutes with the patient. 50% of this time was spent counseling and educating patient on plan of care and medications.     [name], FNP-C 02/22/2020, 2:12 PM [facility] [address], [place], [place] [address] [phone]

## 2020-02-22 NOTE — Patient Instructions (Signed)
Below is our plan:  We will continue current treatment plan. Please call me with any new symptoms or concerns. I will see you soon!  Please make sure you are staying well hydrated. I recommend 50-60 ounces daily. Well balanced diet and regular exercise encouraged.    Please continue follow up with care team as directed.   Follow up with Dr Brett Fairy in 6 months   You may receive a survey regarding today's visit. I encourage you to leave honest feed back as I do use this information to improve patient care. Thank you for seeing me today!      Myasthenia Gravis Myasthenia gravis (MG) is a long-term (chronic) condition that causes weakness in the muscles you can control (voluntary muscles). MG can affect any voluntary muscle. The muscles most often affected are the ones that control:  Eye movement.  Facial movements.  Swallowing. MG is a disease in which the body's disease-fighting system (immune system) attacks its own healthy tissues (autoimmune disease). When you have MG, your immune system makes proteins (antibodies) that block the chemical (acetylcholine) that your body needs to send nerve signals to your muscles. This causes muscle weakness. What are the causes? The exact cause of MG is not known. What increases the risk? The following factors may make you more likely to develop this condition:  Having an enlarged thymus gland. The thymus gland is located under the breastbone. It makes certain cells for the immune system.  Having a family history of MG. What are the signs or symptoms? Symptoms of MG may include:  Drooping eyelids.  Double vision.  Muscle weakness that gets worse with activity and gets better after rest.  Difficulty walking.  Trouble chewing and swallowing.  Trouble making facial expressions.  Slurred speech.  Weakness of the arms, hands, and legs. Sudden, severe difficulty breathing (myasthenic crisis) may develop after having:  An  infection.  A fever.  A bad reaction to a medicine. Myasthenic crisis requires emergency breathing support. Sometimes symptoms of MG go away for a while (remission) and then come back later. How is this diagnosed? This condition may be diagnosed based on:  Your symptoms and medical history.  A physical exam.  Blood tests.  Tests of your muscle strength and function.  Imaging tests, such as a CT scan or an MRI. How is this treated? The goal of treatment is to improve muscle strength. Treatment may include:  Taking medicine.  Making lifestyle changes that focus on saving your energy.  Doing physical therapy to gain strength.  Having surgery to remove the thymus gland (thymectomy). This may result in a long remission for some people.  Having a procedure to remove the acetylcholine antibodies (plasmapheresis).  Getting emergency breathing support, if you experience myasthenic crisis. If you experience remission, you may be able to stop treatment and then resume treatment when your symptoms return. Follow these instructions at home:   Take over-the-counter and prescription medicines only as told by your health care provider.  Get plenty of rest and sleep. Take frequent breaks to rest your eyes, especially when in bright light or working on a computer.  Maintain a healthy diet and a healthy weight. Work with your health care provider or a diet and nutrition specialist (dietitian) if you need help.  Do exercises as told by your health care provider or physical therapist.  Do not use any products that contain nicotine or tobacco, such as cigarettes and e-cigarettes. If you need help quitting, ask your  health care provider.  Prevent infections by: ? Washing your hands often with soap and water. If soap and water are not available, use hand sanitizer. ? Avoiding contact with other people who are sick. ? Avoiding touching your eyes, nose, and mouth. ? Cleaning surfaces in your  home that are touched often using a disinfectant.  Keep all follow-up visits as told by your health care provider. This is important. Contact a health care provider if:  Your symptoms change or get worse, especially after having a fever or infection. Get help right away if:  You have trouble breathing. Summary  Myasthenia gravis (MG) is a long-term (chronic) condition that causes weakness in the muscles you can control (voluntary muscles).  A symptom of MG is muscle weakness that gets worse with activity and gets better after rest.  Sudden, severe difficulty breathing (myasthenic crisis) may develop after having an infection, a fever, or a bad reaction to a medicine.  The goal of treatment is to improve muscle strength. Treatment may include medicines, lifestyle changes, physical therapy, surgery, plasmapheresis, or emergency breathing support. This information is not intended to replace advice given to you by your health care provider. Make sure you discuss any questions you have with your health care provider. Document Revised: 02/16/2017 Document Reviewed: 02/16/2017 Elsevier Patient Education  2020 [employer].

## 2020-02-23 ENCOUNTER — Other Ambulatory Visit: Payer: Self-pay | Admitting: Interventional Radiology

## 2020-02-23 DIAGNOSIS — N2889 Other specified disorders of kidney and ureter: Secondary | ICD-10-CM

## 2020-02-27 ENCOUNTER — Other Ambulatory Visit: Payer: Self-pay | Admitting: Internal Medicine

## 2020-03-02 DIAGNOSIS — G4700 Insomnia, unspecified: Secondary | ICD-10-CM | POA: Diagnosis not present

## 2020-03-02 DIAGNOSIS — F79 Unspecified intellectual disabilities: Secondary | ICD-10-CM | POA: Diagnosis not present

## 2020-03-02 DIAGNOSIS — F39 Unspecified mood [affective] disorder: Secondary | ICD-10-CM | POA: Diagnosis not present

## 2020-03-08 ENCOUNTER — Ambulatory Visit: Payer: PPO | Admitting: Neurology

## 2020-03-09 ENCOUNTER — Inpatient Hospital Stay: Payer: PPO

## 2020-03-09 ENCOUNTER — Inpatient Hospital Stay: Payer: PPO | Admitting: Oncology

## 2020-03-09 ENCOUNTER — Other Ambulatory Visit: Payer: Self-pay | Admitting: Neurology

## 2020-03-09 ENCOUNTER — Other Ambulatory Visit: Payer: Self-pay | Admitting: Oncology

## 2020-03-15 DIAGNOSIS — G4733 Obstructive sleep apnea (adult) (pediatric): Secondary | ICD-10-CM | POA: Diagnosis not present

## 2020-03-17 ENCOUNTER — Encounter: Payer: Self-pay | Admitting: *Deleted

## 2020-03-23 ENCOUNTER — Ambulatory Visit
Admission: RE | Admit: 2020-03-23 | Discharge: 2020-03-23 | Disposition: A | Discharge status: Home / Self Care | Payer: PPO | Source: Ambulatory Visit | Attending: Interventional Radiology | Admitting: Interventional Radiology

## 2020-03-23 ENCOUNTER — Other Ambulatory Visit: Payer: Self-pay

## 2020-03-23 DIAGNOSIS — N2889 Other specified disorders of kidney and ureter: Secondary | ICD-10-CM | POA: Insufficient documentation

## 2020-03-23 DIAGNOSIS — E278 Other specified disorders of adrenal gland: Secondary | ICD-10-CM | POA: Diagnosis not present

## 2020-03-23 DIAGNOSIS — N281 Cyst of kidney, acquired: Secondary | ICD-10-CM | POA: Diagnosis not present

## 2020-03-23 DIAGNOSIS — Z9889 Other specified postprocedural states: Secondary | ICD-10-CM | POA: Diagnosis not present

## 2020-03-23 DIAGNOSIS — Z9049 Acquired absence of other specified parts of digestive tract: Secondary | ICD-10-CM | POA: Diagnosis not present

## 2020-03-23 MED ORDER — GADOBUTROL 1 MMOL/ML IV SOLN
7.5000 mL | Freq: Once | INTRAVENOUS | Status: AC | PRN
  Administered 2020-03-23: 7.5 mL via INTRAVENOUS

## 2020-03-26 ENCOUNTER — Inpatient Hospital Stay: Payer: PPO

## 2020-03-26 ENCOUNTER — Inpatient Hospital Stay: Payer: PPO | Admitting: Oncology

## 2020-03-26 ENCOUNTER — Telehealth: Payer: Self-pay | Admitting: Oncology

## 2020-03-26 NOTE — Telephone Encounter (Signed)
[name] left vm on behalf of Madison Burns requesting a call back as she has an appt this morning and it is freezing rain.  Routing to scheduler for follow up.

## 2020-03-27 DIAGNOSIS — L578 Other skin changes due to chronic exposure to nonionizing radiation: Secondary | ICD-10-CM | POA: Diagnosis not present

## 2020-03-27 DIAGNOSIS — L821 Other seborrheic keratosis: Secondary | ICD-10-CM | POA: Diagnosis not present

## 2020-03-27 DIAGNOSIS — Z85820 Personal history of malignant melanoma of skin: Secondary | ICD-10-CM | POA: Diagnosis not present

## 2020-03-27 DIAGNOSIS — D225 Melanocytic nevi of trunk: Secondary | ICD-10-CM | POA: Diagnosis not present

## 2020-03-27 DIAGNOSIS — Z86018 Personal history of other benign neoplasm: Secondary | ICD-10-CM | POA: Diagnosis not present

## 2020-03-27 DIAGNOSIS — D485 Neoplasm of uncertain behavior of skin: Secondary | ICD-10-CM | POA: Diagnosis not present

## 2020-03-28 ENCOUNTER — Ambulatory Visit
Admission: RE | Admit: 2020-03-28 | Discharge: 2020-03-28 | Disposition: A | Discharge status: Home / Self Care | Payer: PPO | Source: Ambulatory Visit | Attending: Interventional Radiology | Admitting: Interventional Radiology

## 2020-03-28 ENCOUNTER — Other Ambulatory Visit: Payer: Self-pay

## 2020-03-28 DIAGNOSIS — C642 Malignant neoplasm of left kidney, except renal pelvis: Secondary | ICD-10-CM | POA: Diagnosis not present

## 2020-03-28 DIAGNOSIS — Z9889 Other specified postprocedural states: Secondary | ICD-10-CM | POA: Diagnosis not present

## 2020-03-28 DIAGNOSIS — N2889 Other specified disorders of kidney and ureter: Secondary | ICD-10-CM

## 2020-03-28 HISTORY — PX: IR RADIOLOGIST EVAL & MGMT: IMG5224

## 2020-03-28 NOTE — Progress Notes (Signed)
Chief Complaint: Patient was consulted remotely today (TeleHealth) for left renal cell carcinoma at the request of McCullough,Heath K.    Referring Physician(s): Hollice Espy, MD  History of Present Illness: Madison Burns is a 70 y.o. female with multiple medical problems includingdevelopmental delay, myasthenia gravis,malignant invasive thymomaand left renal cell carcinoma. I spoke predominantly with her sister who serves as her primary caregiver via telephone conference.  Her left lower pole renal lesion was first incidentally noted on a CT scan of the abdomen and pelvis from 02/07/2019. Subsequent work-up with MRI performed to evaluate her right upper quadrant pain and left-sided renal mass was performed on 02/21/2019. The MRI identified and endophytic 3.2 x 2.6 cm avidly enhancing lesion in the posterior lower pole of the left kidney. Unfortunately, evaluation was somewhat limited by respiratory motion artifact. Additionally, a lesion was identified in the mediastinum which was subsequently further evaluated by a dedicated chest CT.  She then underwent biopsy of both the left lower pole renal lesion and the mediastinal mass in February 2021.  She underwent a CT biopsy on 03/28/2019.  Biopsy consistent with renal cell carcinoma.Pathology for the mediastinal mass came back as positive for invasive thymoma without evidence of malignancy. The pathology for the left lower pole renal lesion came back as positive for renal cell carcinoma.  Shefirst underwent treatment for her thymoma in which shereceived radiation treatment on 06/08/19 andunderwent2 cycles of reduced carboplatinand Taxol, now complete.Her sister reports that she tolerated both the chemotherapy and the radiation therapy very well with no complications or side effects.  SurveillanceCT Abdomen w wo contrastfrom 4/28/21revealed stable appearance ofher biopsy-proven left lower pole renal cell carcinoma.  I have reviewed her imaging independently. The lesion is quite endophytic and extends toward the renal collecting system and the hilum. By my measurements, the lesion measures up to 4 cm in diameter, slightly larger than previously reported.  She subsequently underwent placement of a left-sided double-J ureteral stent by urology followed by percutaneous renal cryoablation on 08/17/2019.  Her postprocedural course was complicated by hemorrhage into the perinephric space at the end of the procedure.  However, she remained hemodynamically stable and was carefully monitored.  The bleeding was self-limited and she was discharged home the next day in good condition.  We are meeting today in follow-up via teleconference.  As usual, her sister is present with her and providing the majority of her medical history.  Surveillance MRI 03/23/2020: Postprocedural changes related to left lower pole renal cryoablation. No evidence of viable tumor.  [name] is doing well and has no complaints today.  She was happy to hear the good news regarding her MRI results.  Past Medical History:  Diagnosis Date  . Cancer of kidney ([place]) 2021  . Complication of anesthesia    Myasthenia gravis  . GERD (gastroesophageal reflux disease)   . History of seizure disorder   . Hypercholesterolemia   . Hypertension    no longer has it. wt loss  . Hypothyroidism   . Irritable bowel syndrome   . Myasthenia gravis ([place]) 2011  . OSA on CPAP   . Skin cancer 2017   Melanoma on neck  . Thymoma, malignant [facility])     Past Surgical History:  Procedure Laterality Date  . ABDOMINAL HYSTERECTOMY  1990  . APPENDECTOMY  1999  . CHOLECYSTECTOMY  2013  . COLONOSCOPY WITH PROPOFOL N/A 06/30/2017   Procedure: COLONOSCOPY WITH PROPOFOL;  Surgeon: Toledo, Benay Pike, MD;  Location: [facility] ENDOSCOPY;  Service: Gastroenterology;  Laterality: N/A;  . CYSTOSCOPY W/ URETERAL STENT PLACEMENT Left 08/17/2019   Procedure: CYSTOSCOPY WITH RETROGRADE  PYELOGRAM/URETERAL STENT PLACEMENT;  Surgeon: Alexis Frock, MD;  Location: [facility];  Service: Urology;  Laterality: Left;  30 MINS  . ESOPHAGOGASTRODUODENOSCOPY (EGD) WITH PROPOFOL N/A 06/30/2017   Procedure: ESOPHAGOGASTRODUODENOSCOPY (EGD) WITH PROPOFOL;  Surgeon: Toledo, Benay Pike, MD;  Location: [facility] ENDOSCOPY;  Service: Gastroenterology;  Laterality: N/A;  . IR RADIOLOGIST EVAL & MGMT  06/28/2019  . IR RADIOLOGIST EVAL & MGMT  09/14/2019  . RADIOFREQUENCY ABLATION Left 08/17/2019   Procedure: LEFT RENAL CRYO ABLATION;  Surgeon: Jacqulynn Cadet, MD;  Location: [facility];  Service: Anesthesiology;  Laterality: Left;  . TONSILLECTOMY  1959    Allergies: Patient has no known allergies.  Medications: Prior to Admission medications   Medication Sig Start Date End Date Taking? Authorizing Provider  Ascorbic Acid (VITAMIN C) 1000 MG tablet Take 1,000 mg by mouth at bedtime.    [provider]  B Complex-C (B-COMPLEX WITH VITAMIN C) tablet Take 1 tablet by mouth every evening.    [provider]  benzonatate (TESSALON) 100 MG capsule TAKE 1 CAPSULE BY MOUTH TWICE DAILY AS NEEDED COUGH 01/16/20   Einar Pheasant, MD  Biotin 10000 MCG TABS Take 10,000 mcg by mouth daily at 2 PM. (1430)    [provider]  Calcium-Phosphorus-Vitamin D (CITRACAL +D3 PO) Take 1 tablet by mouth daily.    [provider]  diltiazem (CARDIZEM CD) 180 MG 24 hr capsule Take 1 capsule (180 mg total) by mouth daily. 10/29/19   Ezekiel Slocumb, DO  DULoxetine (CYMBALTA) 30 MG capsule TAKE 1 CAPSULE BY MOUTH ONCE DAILY 03/09/20   Earlie Server, MD  folic acid (FOLVITE) 1 MG tablet 2 mg every day po 11/15/19   Dohmeier, Asencion Partridge, MD  Garlic 0981 MG CAPS Take 1,000 mg by mouth in the morning, at noon, and at bedtime. (0800, 1430, 2000)    [provider]  ketoconazole (NIZORAL) 2 % cream SMARTSIG:1 Application Topical 1 to 2 Times Daily 09/27/19   [provider]  levothyroxine  (SYNTHROID) 75 MCG tablet TAKE 1 TABLET BY MOUTH DAILY ON AN EMPTYSTOMACH. WAIT 30 MINUTES BEFORE TAKING OTHER MEDS 02/27/20   Einar Pheasant, MD  methotrexate 2.5 MG tablet TAKE 4 TABLETS BY MOUTH BY MOUTH ONCE A WEEK WITH FOLIC ACID 1/91/47   Lomax, Amy, NP  pantoprazole (PROTONIX) 40 MG tablet Take 40 mg by mouth daily.  11/24/17   [provider]  Probiotic Product (PROBIOTIC MULTI-ENZYME PO) Take 1 capsule by mouth in the morning and at bedtime. Probiotic Multi-Enzyme Digestive Formula    [provider]  pyridostigmine (MESTINON) 60 MG tablet TAKE 1 TABLET BY MOUTH AT  8 AM, ONE AT LUNCH AND ONE AT [facility] 11/15/19   Dohmeier, Asencion Partridge, MD  risperiDONE (RISPERDAL) 1 MG tablet Take 1 mg by mouth at bedtime.  07/08/18   [provider]  sucralfate (CARAFATE) 1 g tablet Take 1 g by mouth 2 (two) times daily. (0800 & 1700) 05/25/17   [provider]  tolterodine (DETROL LA) 4 MG 24 hr capsule TAKE 1 CAPSULE BY MOUTH ONCE EVERY MORNING 01/16/20   Einar Pheasant, MD  vitamin E 180 MG (400 UNITS) capsule Take 400 Units by mouth daily at 2 PM. 1430    [provider]     Family History  Problem Relation Age of Onset  . Colon cancer Maternal Grandfather  stomach/colon  . Heart disease Father        myocardial infarction  . Hyperlipidemia Father   . Hyperlipidemia Sister   . Diabetes Sister   . Bone cancer Other        cousin  . Alzheimer's disease Mother        maternal aunts  . Skin cancer Mother   . Myasthenia gravis Brother        ocular  . Skin cancer Brother   . Stroke Paternal Grandfather   . Breast cancer Neg Hx     Social History   Socioeconomic History  . Marital status: Single    Spouse name: Not on file  . Number of children: 0  . Years of education: Special Ed  . Highest education level: Not on file  Occupational History  . Occupation: Unemployed  Tobacco Use  . Smoking status: Never Smoker  . Smokeless tobacco: Never Used   Vaping Use  . Vaping Use: Never used  Substance and Sexual Activity  . Alcohol use: No    Alcohol/week: 0.0 standard drinks  . Drug use: No  . Sexual activity: Not Currently  Other Topics Concern  . Not on file  Social History Narrative   06/23/17 lives with sister, [name]   Regular exercise-no   Caffeine Use-yes   Social Determinants of Health   Financial Resource Strain: Not on file  Food Insecurity: Not on file  Transportation Needs: Not on file  Physical Activity: Not on file  Stress: Not on file  Social Connections: Not on file    ECOG Status: 0 - Asymptomatic  Review of Systems  Review of Systems: A 12 point ROS discussed and pertinent positives are indicated in the HPI above.  All other systems are negative.  Physical Exam No direct physical exam was performed (except for noted visual exam findings with Video Visits).    Vital Signs: There were no vitals taken for this visit.  Imaging: MR ABDOMEN WWO CONTRAST  Result Date: 03/23/2020 CLINICAL DATA:  Status post renal cryoablation EXAM: MRI ABDOMEN WITHOUT AND WITH CONTRAST TECHNIQUE: Multiplanar multisequence MR imaging of the abdomen was performed both before and after the administration of intravenous contrast. CONTRAST:  7.24mL GADAVIST GADOBUTROL 1 MMOL/ML IV SOLN COMPARISON:  CT abdomen dated 06/07/2019 FINDINGS: Lower chest: Small bilateral pleural effusions, left greater than right. Associated left lower lobe opacity, likely atelectasis. Hepatobiliary: Liver is within normal limits. Status post cholecystectomy. No intrahepatic or extrahepatic ductal dilatation. Pancreas:  Within normal limits. Spleen:  Within normal limits. Adrenals/Urinary Tract: Mild low-density thickening of the bilateral adrenal glands, left greater than right. Postprocedural changes related to left lower pole renal cryoablation. Cryoablation zone measures approximately 2.5 x 2.9 x 3.1 cm. Mild intrinsic T1 hyperintensity (series 15/image 67)  reflects coagulative necrosis. No residual enhancement on postcontrast subtraction imaging to suggest viable tumor. Additional small bilateral renal cysts, including a 2.2 cm left renal sinus cyst (series 5/image 28). No enhancing renal lesions. No hydronephrosis. Stomach/Bowel: Stomach and visualized bowel are grossly unremarkable. Vascular/Lymphatic:  No evidence of abdominal aortic aneurysm. No suspicious abdominal lymphadenopathy. Other:  No abdominal ascites. Musculoskeletal: No focal osseous lesions. IMPRESSION: Postprocedural changes related to left lower pole renal cryoablation. No evidence of viable tumor. Small bilateral pleural effusions, left greater than right. Associated left lower lobe opacity, likely atelectasis. Electronically Signed   By: Julian Hy M.D.   On: 03/23/2020 11:21    Labs:  CBC: Recent Labs    10/24/19  0543 10/25/19 0511 10/26/19 0616 11/04/19 1725  WBC 7.3 7.8 6.3 10.9*  HGB 12.0 11.7* 12.2 13.6  HCT 37.2 35.9* 37.3 43.1  PLT 154 150 143* 238    COAGS: Recent Labs    08/17/19 0632 10/21/19 2112 10/22/19 0758 10/23/19 0222  INR 1.0 0.9  --  1.0  APTT  --   --  32  --     BMP: Recent Labs    10/25/19 0511 10/26/19 0616 10/27/19 0447 11/04/19 1725  NA 133* 135 136 133*  K 3.9 3.4* 3.8 3.5  CL 101 100 100 96*  CO2 26 31 30 26   GLUCOSE 122* 123* 109* 213*  BUN 20 14 13 21   CALCIUM 8.7* 8.4* 8.6* 8.8*  CREATININE 0.75 0.61 0.60 0.99  GFRNONAA >60 >60 >60 59*  GFRAA >60 >60 >60 >60    LIVER FUNCTION TESTS: Recent Labs    06/08/19 1017 09/07/19 0920 10/21/19 2112 10/24/19 0543  BILITOT 0.3 0.8 0.9 0.6  AST 25 35 24 30  ALT 20 20 19 17   ALKPHOS 78 68 59 47  PROT 6.4* 6.7 7.3 7.0  ALBUMIN 3.7 3.5 3.9 2.8*    TUMOR MARKERS: No results for input(s): AFPTM, CEA, CA199, CHROMGRNA in the last 8760 hours.  Assessment and Plan:  70 year old female doing exceptionally well 21-month status post percutaneous cryoablation of left  biopsy-proven renal cell carcinoma.  Her surveillance MRI demonstrates no visual disease.  1.) Repeat MRI with gadolinium contrast and clinic visit in 6 months.    Electronically Signed: Criselda Peaches 03/28/2020, 3:53 PM   I spent a total of  10 Minutes in remote  clinical consultation, greater than 50% of which was counseling/coordinating care for renal cell carcinoma.    Visit type: Audio and video [employer]).   Alternative for in-person consultation at [facility], [address] [place], [place], [place]. This visit type was conducted due to national recommendations for restrictions regarding the COVID-19 Pandemic (e.g. social distancing).  This format is felt to be most appropriate for this patient at this time.  All issues noted in this document were discussed and addressed.

## 2020-03-30 ENCOUNTER — Encounter: Payer: Self-pay | Admitting: Oncology

## 2020-03-30 ENCOUNTER — Inpatient Hospital Stay: Payer: PPO | Attending: Oncology

## 2020-03-30 ENCOUNTER — Inpatient Hospital Stay: Payer: PPO | Admitting: Oncology

## 2020-03-30 VITALS — BP 128/77 | HR 105 | Temp 98.4°F | Wt 197.6 lb

## 2020-03-30 DIAGNOSIS — Z923 Personal history of irradiation: Secondary | ICD-10-CM | POA: Insufficient documentation

## 2020-03-30 DIAGNOSIS — Z79899 Other long term (current) drug therapy: Secondary | ICD-10-CM | POA: Diagnosis not present

## 2020-03-30 DIAGNOSIS — D4989 Neoplasm of unspecified behavior of other specified sites: Secondary | ICD-10-CM | POA: Diagnosis not present

## 2020-03-30 DIAGNOSIS — Z85820 Personal history of malignant melanoma of skin: Secondary | ICD-10-CM | POA: Insufficient documentation

## 2020-03-30 DIAGNOSIS — C642 Malignant neoplasm of left kidney, except renal pelvis: Secondary | ICD-10-CM

## 2020-03-30 DIAGNOSIS — G7000 Myasthenia gravis without (acute) exacerbation: Secondary | ICD-10-CM | POA: Insufficient documentation

## 2020-03-30 DIAGNOSIS — R7989 Other specified abnormal findings of blood chemistry: Secondary | ICD-10-CM | POA: Diagnosis not present

## 2020-03-30 DIAGNOSIS — F79 Unspecified intellectual disabilities: Secondary | ICD-10-CM | POA: Diagnosis not present

## 2020-03-30 DIAGNOSIS — C37 Malignant neoplasm of thymus: Secondary | ICD-10-CM | POA: Insufficient documentation

## 2020-03-30 LAB — CBC WITH DIFFERENTIAL/PLATELET
Abs Immature Granulocytes: 0.06 10*3/uL (ref 0.00–0.07)
Basophils Absolute: 0 10*3/uL (ref 0.0–0.1)
Basophils Relative: 0 %
Eosinophils Absolute: 0.1 10*3/uL (ref 0.0–0.5)
Eosinophils Relative: 1 %
HCT: 41.3 % (ref 36.0–46.0)
Hemoglobin: 13.6 g/dL (ref 12.0–15.0)
Immature Granulocytes: 1 %
Lymphocytes Relative: 9 %
Lymphs Abs: 0.9 10*3/uL (ref 0.7–4.0)
MCH: 31.1 pg (ref 26.0–34.0)
MCHC: 32.9 g/dL (ref 30.0–36.0)
MCV: 94.5 fL (ref 80.0–100.0)
Monocytes Absolute: 0.7 10*3/uL (ref 0.1–1.0)
Monocytes Relative: 7 %
Neutro Abs: 8.1 10*3/uL — ABNORMAL HIGH (ref 1.7–7.7)
Neutrophils Relative %: 82 %
Platelets: 237 10*3/uL (ref 150–400)
RBC: 4.37 MIL/uL (ref 3.87–5.11)
RDW: 15.1 % (ref 11.5–15.5)
WBC: 9.9 10*3/uL (ref 4.0–10.5)
nRBC: 0 % (ref 0.0–0.2)

## 2020-03-30 LAB — COMPREHENSIVE METABOLIC PANEL
ALT: 23 U/L (ref 0–44)
AST: 26 U/L (ref 15–41)
Albumin: 3.5 g/dL (ref 3.5–5.0)
Alkaline Phosphatase: 69 U/L (ref 38–126)
Anion gap: 11 (ref 5–15)
BUN: 14 mg/dL (ref 8–23)
CO2: 27 mmol/L (ref 22–32)
Calcium: 8.6 mg/dL — ABNORMAL LOW (ref 8.9–10.3)
Chloride: 100 mmol/L (ref 98–111)
Creatinine, Ser: 1.16 mg/dL — ABNORMAL HIGH (ref 0.44–1.00)
GFR, Estimated: 51 mL/min — ABNORMAL LOW (ref >60)
Glucose, Bld: 97 mg/dL (ref 70–99)
Potassium: 4 mmol/L (ref 3.5–5.1)
Sodium: 138 mmol/L (ref 135–145)
Total Bilirubin: 0.7 mg/dL (ref 0.3–1.2)
Total Protein: 6.9 g/dL (ref 6.5–8.1)

## 2020-03-30 NOTE — Progress Notes (Signed)
Hematology/Oncology follow up  note [facility] [phone] [phone]   Patient Care Team: Einar Pheasant, MD as PCP - General (Internal Medicine) Telford Nab, RN as Oncology Nurse Navigator  REFERRING PROVIDER: Einar Pheasant, MD  CHIEF COMPLAINTS/REASON FOR VISIT:  Follow-up for thymoma  HISTORY OF PRESENTING ILLNESS:   Madison Burns is a  70 y.o.  female with PMH listed below was seen in consultation at the request of  Einar Pheasant, MD  for evaluation of thymoma.  Patient was accompanied by her sister today.  History was obtained from her sister.  Patient has intellectual disability Patient initially underwent a CT abdomen pelvis with contrast on 02/07/2019 for worsening umbilical pain.  It reviewed incidental 3 cm renal mass on the left.  Subsequent MRCP 02/21/2019 performed for the purpose of right upper quadrant pain as well as renal mass in question.  MRI 02/20/2018 showed 3.2 x 2.6 left lower pole endophytic renal mass, consistent with possible renal cell carcinoma.  No signs of metastatic disease in abdomen.  Adrenal lesion more suggestive adenomas.  There was an incidental finding of lobular area along the anterior mediastinum, questionably e mediastinal mass.  CT scan 02/07/2019 showed lobulated internally calcified mass of the anterior mediastinum which effaces the central portion of the left brachiocephalic vein and a very closely abuts the tubular ascending aorta, 5.8 x 4.9 x 4.1 cm.  Adjacent pleural nodularity about the right upper lobe. 3 mm pulmonary nodule of the right upper lobe, hepatic steatosis.  #Patient has a chronic history of myasthenia gravis, initially diagnosed in 2011.  Patient had been treated with prednisone for prolonged period of time.  Recently she has been off prednisone and is currently taking methotrexate and pyridostigmine for symptoms.  Per sister, patient does not complain much of weakness.  At her  baseline.  She is able to feed and dress herself.  She needs some assistance from her sister for bathing.  She likes to watch TV.  Patient has been seen by Dr. Genevive Bi and Dr. Erlene Quan.  Patient's sister would like to entertain the diagnosis and potential therapy. Patient underwent CT-guided needle biopsy of the anterior mediastinal mass, as well as biopsy of the kidney mass Anterior mediastinal mass biopsy pathology showed thymoma, predominantly B2 patent with subsets of a B3 pattern. Left kidney mass showed renal cell carcinoma,tumor cannot be reliably subtyped based on this limited sample and the IHC profile  She has a history of melanoma on her neck in 2017.  Sister is POA. Patient lives with sister  #Cancer treatment 04/27/2019-05/18/2019 patient status post 2 cycles of dose reduced carboplatin and etoposide treatment concurrently with radiation. April 2021, patient  finished radiation treatments..  A# 08/17/2019-08/18/2019 Left  RCC, patient was admitted.  Patient underwent cystoscopy with Dr. Tammi Klippel with retrograde ureteral stent placement for renal protection during cryoablation.  The patient was taken to IR for an image guided left renal mass cryoablation by Dr. Laurence Ferrari.  Procedure was complicated by perinephric bleeding following the procedure.  She was monitored closely and remained hemodynamically stable.  INTERVAL HISTORY Madison Burns is a 70 y.o. female who has above history reviewed by me today presents for follow up visit for management of malignant thymoma, left renal cell carcinoma Problems and complaints are listed below: Patient was accompanied by sister.  Due to development delay, patient cannot provide much history.  History was obtained from sister.  Per sister, patient has been doing quite well.  She has  complained occasionally chest wall pain and rib cage pain.  Currently no pain. Patient was admitted from 10/22/2019-19 2021 due to A. fib with RVR, secondary to sepsis and  respiratory failure with pneumonia.  Patient was also seen by neurology for suspected MG exacerbation secondary to acute infection.   Review of Systems  Unable to perform ROS: Other (Intellectual Disability)  Gastrointestinal: Negative for diarrhea and nausea.  Genitourinary: Negative for dysuria and frequency.     MEDICAL HISTORY:  Past Medical History:  Diagnosis Date  . Cancer of kidney ([place]) 2021  . Complication of anesthesia    Myasthenia gravis  . GERD (gastroesophageal reflux disease)   . History of seizure disorder   . Hypercholesterolemia   . Hypertension    no longer has it. wt loss  . Hypothyroidism   . Irritable bowel syndrome   . Myasthenia gravis ([place]) 2011  . OSA on CPAP   . Skin cancer 2017   Melanoma on neck  . Thymoma, malignant ([place])     SURGICAL HISTORY: Past Surgical History:  Procedure Laterality Date  . ABDOMINAL HYSTERECTOMY  1990  . APPENDECTOMY  1999  . CHOLECYSTECTOMY  2013  . COLONOSCOPY WITH PROPOFOL N/A 06/30/2017   Procedure: COLONOSCOPY WITH PROPOFOL;  Surgeon: Toledo, Benay Pike, MD;  Location: [facility] ENDOSCOPY;  Service: Gastroenterology;  Laterality: N/A;  . CYSTOSCOPY W/ URETERAL STENT PLACEMENT Left 08/17/2019   Procedure: CYSTOSCOPY WITH RETROGRADE PYELOGRAM/URETERAL STENT PLACEMENT;  Surgeon: Alexis Frock, MD;  Location: [facility];  Service: Urology;  Laterality: Left;  30 MINS  . ESOPHAGOGASTRODUODENOSCOPY (EGD) WITH PROPOFOL N/A 06/30/2017   Procedure: ESOPHAGOGASTRODUODENOSCOPY (EGD) WITH PROPOFOL;  Surgeon: Toledo, Benay Pike, MD;  Location: [facility] ENDOSCOPY;  Service: Gastroenterology;  Laterality: N/A;  . IR RADIOLOGIST EVAL & MGMT  06/28/2019  . IR RADIOLOGIST EVAL & MGMT  09/14/2019  . IR RADIOLOGIST EVAL & MGMT  03/28/2020  . RADIOFREQUENCY ABLATION Left 08/17/2019   Procedure: LEFT RENAL CRYO ABLATION;  Surgeon: Jacqulynn Cadet, MD;  Location: [facility];  Service: Anesthesiology;  Laterality: Left;  . TONSILLECTOMY  1959    SOCIAL  HISTORY: Social History   Socioeconomic History  . Marital status: Single    Spouse name: Not on file  . Number of children: 0  . Years of education: Special Ed  . Highest education level: Not on file  Occupational History  . Occupation: Unemployed  Tobacco Use  . Smoking status: Never Smoker  . Smokeless tobacco: Never Used  Vaping Use  . Vaping Use: Never used  Substance and Sexual Activity  . Alcohol use: No    Alcohol/week: 0.0 standard drinks  . Drug use: No  . Sexual activity: Not Currently  Other Topics Concern  . Not on file  Social History Narrative   06/23/17 lives with sister, [name]   Regular exercise-no   Caffeine Use-yes   Social Determinants of Health   Financial Resource Strain: Not on file  Food Insecurity: Not on file  Transportation Needs: Not on file  Physical Activity: Not on file  Stress: Not on file  Social Connections: Not on file  Intimate Partner Violence: Not on file    FAMILY HISTORY: Family History  Problem Relation Age of Onset  . Colon cancer Maternal Grandfather        stomach/colon  . Heart disease Father        myocardial infarction  . Hyperlipidemia Father   . Hyperlipidemia Sister   . Diabetes Sister   . Bone  cancer Other        cousin  . Alzheimer's disease Mother        maternal aunts  . Skin cancer Mother   . Myasthenia gravis Brother        ocular  . Skin cancer Brother   . Stroke Paternal Grandfather   . Breast cancer Neg Hx     ALLERGIES:  has No Known Allergies.  MEDICATIONS:  Current Outpatient Medications  Medication Sig Dispense Refill  . Ascorbic Acid (VITAMIN C) 1000 MG tablet Take 1,000 mg by mouth at bedtime.    . B Complex-C (B-COMPLEX WITH VITAMIN C) tablet Take 1 tablet by mouth every evening.    . benzonatate (TESSALON) 100 MG capsule TAKE 1 CAPSULE BY MOUTH TWICE DAILY AS NEEDED COUGH 90 capsule 0  . Biotin 10000 MCG TABS Take 10,000 mcg by mouth daily at 2 PM. (1430)    .  Calcium-Phosphorus-Vitamin D (CITRACAL +D3 PO) Take 1 tablet by mouth daily.    [name] diltiazem (CARDIZEM CD) 180 MG 24 hr capsule Take 1 capsule (180 mg total) by mouth daily. 30 capsule 2  . DULoxetine (CYMBALTA) 30 MG capsule TAKE 1 CAPSULE BY MOUTH ONCE DAILY 30 capsule 5  . folic acid (FOLVITE) 1 MG tablet 2 mg every day po 60 tablet 5  . Garlic 3016 MG CAPS Take 1,000 mg by mouth in the morning, at noon, and at bedtime. (0800, 1430, 2000)    . ketoconazole (NIZORAL) 2 % cream SMARTSIG:1 Application Topical 1 to 2 Times Daily    . levothyroxine (SYNTHROID) 75 MCG tablet TAKE 1 TABLET BY MOUTH DAILY ON AN EMPTYSTOMACH. WAIT 30 MINUTES BEFORE TAKING OTHER MEDS 90 tablet 1  . methotrexate 2.5 MG tablet TAKE 4 TABLETS BY MOUTH BY MOUTH ONCE A WEEK WITH FOLIC ACID 16 tablet 5  . pantoprazole (PROTONIX) 40 MG tablet Take 40 mg by mouth daily.     . Probiotic Product (PROBIOTIC MULTI-ENZYME PO) Take 1 capsule by mouth in the morning and at bedtime. Probiotic Multi-Enzyme Digestive Formula    . pyridostigmine (MESTINON) 60 MG tablet TAKE 1 TABLET BY MOUTH AT  8 AM, ONE AT LUNCH AND ONE AT 8PM 90 tablet 6  . risperiDONE (RISPERDAL) 1 MG tablet Take 1 mg by mouth at bedtime.     . sucralfate (CARAFATE) 1 g tablet Take 1 g by mouth 2 (two) times daily. (0800 & 1700)    . tolterodine (DETROL LA) 4 MG 24 hr capsule TAKE 1 CAPSULE BY MOUTH ONCE EVERY MORNING 90 capsule 1  . vitamin E 180 MG (400 UNITS) capsule Take 400 Units by mouth daily at 2 PM. 1430     No current facility-administered medications for this visit.     PHYSICAL EXAMINATION: ECOG PERFORMANCE STATUS: 1 - Symptomatic but completely ambulatory Vitals:   03/30/20 0909  BP: 128/77  Pulse: (!) 105  Temp: 98.4 F (36.9 C)  SpO2: 96%   Filed Weights   03/30/20 0909  Weight: 197 lb 9 oz (89.6 kg)    Physical Exam Constitutional:      General: She is not in acute distress.    Appearance: She is obese.  HENT:     Head: Normocephalic  and atraumatic.  Eyes:     General: No scleral icterus. Cardiovascular:     Rate and Rhythm: Normal rate and regular rhythm.     Heart sounds: Normal heart sounds.  Pulmonary:     Effort: Pulmonary effort  is normal. No respiratory distress.     Breath sounds: No wheezing.  Abdominal:     General: Bowel sounds are normal. There is no distension.     Palpations: Abdomen is soft.  Musculoskeletal:        General: No deformity. Normal range of motion.     Cervical back: Normal range of motion and neck supple.  Skin:    General: Skin is warm and dry.     Findings: No erythema or rash.  Neurological:     Mental Status: She is alert. Mental status is at baseline.     Cranial Nerves: No cranial nerve deficit.     Coordination: Coordination normal.     Comments: Oriented in person   Psychiatric:        Mood and Affect: Mood normal.     LABORATORY DATA:  I have reviewed the data as listed Lab Results  Component Value Date   WBC 9.9 03/30/2020   HGB 13.6 03/30/2020   HCT 41.3 03/30/2020   MCV 94.5 03/30/2020   PLT 237 03/30/2020   Recent Labs    10/21/19 2112 10/23/19 0222 10/24/19 0543 10/25/19 0511 10/26/19 0616 10/27/19 0447 11/04/19 1725 03/30/20 0846  NA 134*   < > 136   < > 135 136 133* 138  K 3.4*   < > 3.8   < > 3.4* 3.8 3.5 4.0  CL 98   < > 102   < > 100 100 96* 100  CO2 24   < > 27   < > 31 30 26 27   GLUCOSE 185*   < > 126*   < > 123* 109* 213* 97  BUN 14   < > 15   < > 14 13 21 14   CREATININE 0.71   < > 0.81   < > 0.61 0.60 0.99 1.16*  CALCIUM 8.8*   < > 8.7*   < > 8.4* 8.6* 8.8* 8.6*  GFRNONAA >60   < > >60   < > >60 >60 59* 51*  GFRAA >60   < > >60   < > >60 >60 >60  --   PROT 7.3  --  7.0  --   --   --   --  6.9  ALBUMIN 3.9  --  2.8*  --   --   --   --  3.5  AST 24  --  30  --   --   --   --  26  ALT 19  --  17  --   --   --   --  23  ALKPHOS 59  --  47  --   --   --   --  69  BILITOT 0.9  --  0.6  --   --   --   --  0.7  BILIDIR  --   --  <0.1  --    --   --   --   --   IBILI  --   --  NOT CALCULATED  --   --   --   --   --    < > = values in this interval not displayed.   Iron/TIBC/Ferritin/ %Sat No results found for: IRON, TIBC, FERRITIN, IRONPCTSAT    RADIOGRAPHIC STUDIES: I have personally reviewed the radiological images as listed and agreed with the findings in the report.. MR ABDOMEN WWO CONTRAST  Result Date: 03/23/2020 CLINICAL DATA:  Status post renal  cryoablation EXAM: MRI ABDOMEN WITHOUT AND WITH CONTRAST TECHNIQUE: Multiplanar multisequence MR imaging of the abdomen was performed both before and after the administration of intravenous contrast. CONTRAST:  7.56mL GADAVIST GADOBUTROL 1 MMOL/ML IV SOLN COMPARISON:  CT abdomen dated 06/07/2019 FINDINGS: Lower chest: Small bilateral pleural effusions, left greater than right. Associated left lower lobe opacity, likely atelectasis. Hepatobiliary: Liver is within normal limits. Status post cholecystectomy. No intrahepatic or extrahepatic ductal dilatation. Pancreas:  Within normal limits. Spleen:  Within normal limits. Adrenals/Urinary Tract: Mild low-density thickening of the bilateral adrenal glands, left greater than right. Postprocedural changes related to left lower pole renal cryoablation. Cryoablation zone measures approximately 2.5 x 2.9 x 3.1 cm. Mild intrinsic T1 hyperintensity (series 15/image 67) reflects coagulative necrosis. No residual enhancement on postcontrast subtraction imaging to suggest viable tumor. Additional small bilateral renal cysts, including a 2.2 cm left renal sinus cyst (series 5/image 28). No enhancing renal lesions. No hydronephrosis. Stomach/Bowel: Stomach and visualized bowel are grossly unremarkable. Vascular/Lymphatic:  No evidence of abdominal aortic aneurysm. No suspicious abdominal lymphadenopathy. Other:  No abdominal ascites. Musculoskeletal: No focal osseous lesions. IMPRESSION: Postprocedural changes related to left lower pole renal cryoablation. No  evidence of viable tumor. Small bilateral pleural effusions, left greater than right. Associated left lower lobe opacity, likely atelectasis. Electronically Signed   By: Julian Hy M.D.   On: 03/23/2020 11:21   IR Radiologist Eval & Mgmt  Result Date: 03/28/2020 Please refer to notes tab for details about interventional procedure. (Op Note)      ASSESSMENT & PLAN:  1. Renal cell cancer, left ([place])   2. Thymoma   3. MG (myasthenia gravis) ([facility])    #Invasive thymoma, S/p 2 cycle of  reduced carboplatin AUC 4, etoposide day 1 to 3 at 75 mg/m, every 3 weeks, and concurrent radiation Labs are reviewed and discussed with patient. and sister. I will obtain CT chest with contrast for surveillance.  Recommend continue surveillance every 6 months for the first 2 years after treatment and then annually. Elevated creatinine, likely due to decreased oral intake I recommend patient to increase oral hydration.  #Myasthenia gravis, continue follow-up with neurology. #Left  RCC, patient is status post cryoablation of the lesion.  Recent MRI images were reviewed and discussed with patient.  No evidence of recurrence. Dr.McCullough plans to repeat MRI in 6 months.  Advised patient to follow-up in 6 months. All questions were answered. The patient knows to call the clinic with any problems questions or concerns.   Earlie Server, MD, PhD Hematology Oncology [facility] Pager- [phone]

## 2020-03-30 NOTE — Progress Notes (Signed)
Patient here for follow up. Occasional pain to back of neck, right ribcage and chest.

## 2020-04-05 ENCOUNTER — Telehealth: Payer: Self-pay | Admitting: Family Medicine

## 2020-04-05 NOTE — Telephone Encounter (Signed)
I called sister of pt.  Pt has had wheezing and SOB since yesterday.  She is taking methotrexate and mestinon for MG.  She is not on prednisone.  She is asking for a prescription for this.  I relayed that she needs to be evaluated by pcp, urgent care.  She did not want to go as she felt that other MD's did not treat MG. I relayed could be other issues as well and be well advised to be evaluated by PCP or urgent care.  She was not too happy, I relayed that I will forward message to Dr. Brett Fairy, but could not promise that she would see and respond.

## 2020-04-05 NOTE — Telephone Encounter (Signed)
Pt's sister(on DPR) is asking if pt should be put back on Prednisone due to pt wheezing and having difficulty breathing, please call

## 2020-04-06 ENCOUNTER — Emergency Department: Payer: PPO

## 2020-04-06 ENCOUNTER — Other Ambulatory Visit: Payer: Self-pay

## 2020-04-06 ENCOUNTER — Encounter: Payer: Self-pay | Admitting: Emergency Medicine

## 2020-04-06 ENCOUNTER — Telehealth: Payer: Self-pay | Admitting: Internal Medicine

## 2020-04-06 ENCOUNTER — Inpatient Hospital Stay
Admission: EM | Admit: 2020-04-06 | Discharge: 2020-04-13 | DRG: 871 | Disposition: A | Discharge status: Home Health | Payer: PPO | Attending: Internal Medicine | Admitting: Internal Medicine

## 2020-04-06 DIAGNOSIS — Z79899 Other long term (current) drug therapy: Secondary | ICD-10-CM | POA: Diagnosis not present

## 2020-04-06 DIAGNOSIS — Z560 Unemployment, unspecified: Secondary | ICD-10-CM

## 2020-04-06 DIAGNOSIS — E871 Hypo-osmolality and hyponatremia: Secondary | ICD-10-CM | POA: Diagnosis not present

## 2020-04-06 DIAGNOSIS — I4891 Unspecified atrial fibrillation: Secondary | ICD-10-CM | POA: Diagnosis not present

## 2020-04-06 DIAGNOSIS — Z83438 Family history of other disorder of lipoprotein metabolism and other lipidemia: Secondary | ICD-10-CM

## 2020-04-06 DIAGNOSIS — A419 Sepsis, unspecified organism: Secondary | ICD-10-CM | POA: Diagnosis not present

## 2020-04-06 DIAGNOSIS — E785 Hyperlipidemia, unspecified: Secondary | ICD-10-CM | POA: Diagnosis present

## 2020-04-06 DIAGNOSIS — R0602 Shortness of breath: Secondary | ICD-10-CM | POA: Diagnosis not present

## 2020-04-06 DIAGNOSIS — J189 Pneumonia, unspecified organism: Secondary | ICD-10-CM | POA: Diagnosis not present

## 2020-04-06 DIAGNOSIS — J9601 Acute respiratory failure with hypoxia: Secondary | ICD-10-CM | POA: Diagnosis present

## 2020-04-06 DIAGNOSIS — I10 Essential (primary) hypertension: Secondary | ICD-10-CM | POA: Diagnosis present

## 2020-04-06 DIAGNOSIS — I517 Cardiomegaly: Secondary | ICD-10-CM | POA: Diagnosis not present

## 2020-04-06 DIAGNOSIS — M7989 Other specified soft tissue disorders: Secondary | ICD-10-CM | POA: Diagnosis not present

## 2020-04-06 DIAGNOSIS — G40309 Generalized idiopathic epilepsy and epileptic syndromes, not intractable, without status epilepticus: Secondary | ICD-10-CM

## 2020-04-06 DIAGNOSIS — F73 Profound intellectual disabilities: Secondary | ICD-10-CM

## 2020-04-06 DIAGNOSIS — Z7952 Long term (current) use of systemic steroids: Secondary | ICD-10-CM

## 2020-04-06 DIAGNOSIS — E039 Hypothyroidism, unspecified: Secondary | ICD-10-CM | POA: Diagnosis not present

## 2020-04-06 DIAGNOSIS — Z9049 Acquired absence of other specified parts of digestive tract: Secondary | ICD-10-CM

## 2020-04-06 DIAGNOSIS — E669 Obesity, unspecified: Secondary | ICD-10-CM | POA: Diagnosis present

## 2020-04-06 DIAGNOSIS — G4733 Obstructive sleep apnea (adult) (pediatric): Secondary | ICD-10-CM | POA: Diagnosis not present

## 2020-04-06 DIAGNOSIS — Z79890 Hormone replacement therapy (postmenopausal): Secondary | ICD-10-CM

## 2020-04-06 DIAGNOSIS — Z9225 Personal history of immunosupression therapy: Secondary | ICD-10-CM | POA: Diagnosis not present

## 2020-04-06 DIAGNOSIS — R079 Chest pain, unspecified: Secondary | ICD-10-CM | POA: Diagnosis not present

## 2020-04-06 DIAGNOSIS — R1310 Dysphagia, unspecified: Secondary | ICD-10-CM | POA: Diagnosis present

## 2020-04-06 DIAGNOSIS — Z6835 Body mass index (BMI) 35.0-35.9, adult: Secondary | ICD-10-CM | POA: Diagnosis not present

## 2020-04-06 DIAGNOSIS — Z85820 Personal history of malignant melanoma of skin: Secondary | ICD-10-CM

## 2020-04-06 DIAGNOSIS — K219 Gastro-esophageal reflux disease without esophagitis: Secondary | ICD-10-CM | POA: Diagnosis not present

## 2020-04-06 DIAGNOSIS — Z823 Family history of stroke: Secondary | ICD-10-CM

## 2020-04-06 DIAGNOSIS — K589 Irritable bowel syndrome without diarrhea: Secondary | ICD-10-CM | POA: Diagnosis present

## 2020-04-06 DIAGNOSIS — R000 Tachycardia, unspecified: Secondary | ICD-10-CM | POA: Diagnosis present

## 2020-04-06 DIAGNOSIS — G40909 Epilepsy, unspecified, not intractable, without status epilepticus: Secondary | ICD-10-CM | POA: Diagnosis present

## 2020-04-06 DIAGNOSIS — Z7189 Other specified counseling: Secondary | ICD-10-CM | POA: Diagnosis not present

## 2020-04-06 DIAGNOSIS — Z8249 Family history of ischemic heart disease and other diseases of the circulatory system: Secondary | ICD-10-CM

## 2020-04-06 DIAGNOSIS — Z808 Family history of malignant neoplasm of other organs or systems: Secondary | ICD-10-CM

## 2020-04-06 DIAGNOSIS — R4189 Other symptoms and signs involving cognitive functions and awareness: Secondary | ICD-10-CM | POA: Diagnosis not present

## 2020-04-06 DIAGNOSIS — R202 Paresthesia of skin: Secondary | ICD-10-CM | POA: Diagnosis present

## 2020-04-06 DIAGNOSIS — D849 Immunodeficiency, unspecified: Secondary | ICD-10-CM

## 2020-04-06 DIAGNOSIS — Z800 Family history of malignant neoplasm of digestive organs: Secondary | ICD-10-CM

## 2020-04-06 DIAGNOSIS — G7001 Myasthenia gravis with (acute) exacerbation: Secondary | ICD-10-CM | POA: Diagnosis present

## 2020-04-06 DIAGNOSIS — G7000 Myasthenia gravis without (acute) exacerbation: Secondary | ICD-10-CM | POA: Diagnosis not present

## 2020-04-06 DIAGNOSIS — Z90710 Acquired absence of both cervix and uterus: Secondary | ICD-10-CM

## 2020-04-06 DIAGNOSIS — J90 Pleural effusion, not elsewhere classified: Secondary | ICD-10-CM | POA: Diagnosis not present

## 2020-04-06 DIAGNOSIS — J811 Chronic pulmonary edema: Secondary | ICD-10-CM | POA: Diagnosis not present

## 2020-04-06 DIAGNOSIS — R609 Edema, unspecified: Secondary | ICD-10-CM

## 2020-04-06 DIAGNOSIS — J9811 Atelectasis: Secondary | ICD-10-CM | POA: Diagnosis not present

## 2020-04-06 DIAGNOSIS — Z85528 Personal history of other malignant neoplasm of kidney: Secondary | ICD-10-CM | POA: Diagnosis not present

## 2020-04-06 DIAGNOSIS — Z66 Do not resuscitate: Secondary | ICD-10-CM | POA: Diagnosis not present

## 2020-04-06 DIAGNOSIS — Z20822 Contact with and (suspected) exposure to covid-19: Secondary | ICD-10-CM | POA: Diagnosis present

## 2020-04-06 DIAGNOSIS — Z820 Family history of epilepsy and other diseases of the nervous system: Secondary | ICD-10-CM

## 2020-04-06 DIAGNOSIS — Z515 Encounter for palliative care: Secondary | ICD-10-CM | POA: Diagnosis not present

## 2020-04-06 DIAGNOSIS — M79606 Pain in leg, unspecified: Secondary | ICD-10-CM | POA: Diagnosis present

## 2020-04-06 DIAGNOSIS — Z833 Family history of diabetes mellitus: Secondary | ICD-10-CM

## 2020-04-06 LAB — CBC WITH DIFFERENTIAL/PLATELET
Abs Immature Granulocytes: 0.15 10*3/uL — ABNORMAL HIGH (ref 0.00–0.07)
Basophils Absolute: 0 10*3/uL (ref 0.0–0.1)
Basophils Relative: 0 %
Eosinophils Absolute: 0 10*3/uL (ref 0.0–0.5)
Eosinophils Relative: 0 %
HCT: 34.1 % — ABNORMAL LOW (ref 36.0–46.0)
Hemoglobin: 11.2 g/dL — ABNORMAL LOW (ref 12.0–15.0)
Immature Granulocytes: 1 %
Lymphocytes Relative: 2 %
Lymphs Abs: 0.4 10*3/uL — ABNORMAL LOW (ref 0.7–4.0)
MCH: 30.4 pg (ref 26.0–34.0)
MCHC: 32.8 g/dL (ref 30.0–36.0)
MCV: 92.4 fL (ref 80.0–100.0)
Monocytes Absolute: 1.5 10*3/uL — ABNORMAL HIGH (ref 0.1–1.0)
Monocytes Relative: 9 %
Neutro Abs: 15.2 10*3/uL — ABNORMAL HIGH (ref 1.7–7.7)
Neutrophils Relative %: 88 %
Platelets: 224 10*3/uL (ref 150–400)
RBC: 3.69 MIL/uL — ABNORMAL LOW (ref 3.87–5.11)
RDW: 15.2 % (ref 11.5–15.5)
WBC: 17.3 10*3/uL — ABNORMAL HIGH (ref 4.0–10.5)
nRBC: 0 % (ref 0.0–0.2)

## 2020-04-06 LAB — COMPREHENSIVE METABOLIC PANEL
ALT: 29 U/L (ref 0–44)
AST: 28 U/L (ref 15–41)
Albumin: 3.1 g/dL — ABNORMAL LOW (ref 3.5–5.0)
Alkaline Phosphatase: 66 U/L (ref 38–126)
Anion gap: 9 (ref 5–15)
BUN: 21 mg/dL (ref 8–23)
CO2: 25 mmol/L (ref 22–32)
Calcium: 9 mg/dL (ref 8.9–10.3)
Chloride: 92 mmol/L — ABNORMAL LOW (ref 98–111)
Creatinine, Ser: 0.99 mg/dL (ref 0.44–1.00)
GFR, Estimated: >60 mL/min (ref >60)
Glucose, Bld: 153 mg/dL — ABNORMAL HIGH (ref 70–99)
Potassium: 4.1 mmol/L (ref 3.5–5.1)
Sodium: 126 mmol/L — ABNORMAL LOW (ref 135–145)
Total Bilirubin: 0.8 mg/dL (ref 0.3–1.2)
Total Protein: 6.3 g/dL — ABNORMAL LOW (ref 6.5–8.1)

## 2020-04-06 LAB — SODIUM, URINE, RANDOM: Sodium, Ur: <10 mmol/L

## 2020-04-06 LAB — BLOOD GAS, VENOUS
Acid-Base Excess: 1.9 mmol/L (ref 0.0–2.0)
Bicarbonate: 26.6 mmol/L (ref 20.0–28.0)
FIO2: 0.21
O2 Saturation: 63.1 %
Patient temperature: 37
pCO2, Ven: 41 mmHg — ABNORMAL LOW (ref 44.0–60.0)
pH, Ven: 7.42 (ref 7.250–7.430)
pO2, Ven: 32 mmHg (ref 32.0–45.0)

## 2020-04-06 LAB — URINALYSIS, ROUTINE W REFLEX MICROSCOPIC
Bilirubin Urine: NEGATIVE
Glucose, UA: NEGATIVE mg/dL
Hgb urine dipstick: NEGATIVE
Ketones, ur: NEGATIVE mg/dL
Leukocytes,Ua: NEGATIVE
Nitrite: NEGATIVE
Protein, ur: NEGATIVE mg/dL
Specific Gravity, Urine: 1.01 (ref 1.005–1.030)
pH: 6 (ref 5.0–8.0)

## 2020-04-06 LAB — RESP PANEL BY RT-PCR (FLU A&B, COVID) ARPGX2
Influenza A by PCR: NEGATIVE
Influenza B by PCR: NEGATIVE
SARS Coronavirus 2 by RT PCR: NEGATIVE

## 2020-04-06 LAB — OSMOLALITY, URINE: Osmolality, Ur: 289 mOsm/kg — ABNORMAL LOW (ref 300–900)

## 2020-04-06 LAB — LACTIC ACID, PLASMA: Lactic Acid, Venous: 1.5 mmol/L (ref 0.5–1.9)

## 2020-04-06 MED ORDER — LEVOTHYROXINE SODIUM 50 MCG PO TABS
75.0000 ug | ORAL_TABLET | Freq: Every day | ORAL | Status: DC
  Administered 2020-04-07 – 2020-04-13 (×7): 75 ug via ORAL
  Filled 2020-04-06 (×4): qty 1
  Filled 2020-04-06: qty 2
  Filled 2020-04-06 (×3): qty 1

## 2020-04-06 MED ORDER — DULOXETINE HCL 30 MG PO CPEP
30.0000 mg | ORAL_CAPSULE | Freq: Every day | ORAL | Status: DC
  Administered 2020-04-07 – 2020-04-13 (×7): 30 mg via ORAL
  Filled 2020-04-06 (×7): qty 1

## 2020-04-06 MED ORDER — PREDNISONE 10 MG PO TABS: ORAL_TABLET | ORAL | 3 refills | Status: DC

## 2020-04-06 MED ORDER — PYRIDOSTIGMINE BROMIDE 60 MG PO TABS
60.0000 mg | ORAL_TABLET | Freq: Once | ORAL | Status: AC
  Administered 2020-04-06: 60 mg via ORAL
  Filled 2020-04-06: qty 1

## 2020-04-06 MED ORDER — SODIUM CHLORIDE 0.9 % IV SOLN
2.0000 g | Freq: Two times a day (BID) | INTRAVENOUS | Status: DC
  Administered 2020-04-07: 2 g via INTRAVENOUS
  Filled 2020-04-06: qty 2

## 2020-04-06 MED ORDER — RISPERIDONE 1 MG PO TABS
1.0000 mg | ORAL_TABLET | Freq: Every day | ORAL | Status: DC
  Administered 2020-04-06 – 2020-04-12 (×7): 1 mg via ORAL
  Filled 2020-04-06 (×8): qty 1

## 2020-04-06 MED ORDER — IMMUNE GLOBULIN (HUMAN) 10 GM/100ML IV SOLN
400.0000 mg/kg | INTRAVENOUS | Status: AC
  Administered 2020-04-06 – 2020-04-10 (×5): 25 g via INTRAVENOUS
  Filled 2020-04-06 (×5): qty 250

## 2020-04-06 MED ORDER — ACETAMINOPHEN 650 MG RE SUPP: 650.0000 mg | Freq: Four times a day (QID) | RECTAL | Status: DC | PRN

## 2020-04-06 MED ORDER — METHOTREXATE 2.5 MG PO TABS
10.0000 mg | ORAL_TABLET | ORAL | Status: DC
  Administered 2020-04-07: 10 mg via ORAL
  Filled 2020-04-06: qty 4

## 2020-04-06 MED ORDER — VANCOMYCIN HCL 2000 MG/400ML IV SOLN
2000.0000 mg | Freq: Once | INTRAVENOUS | Status: AC
  Administered 2020-04-06: 2000 mg via INTRAVENOUS
  Filled 2020-04-06 (×2): qty 400

## 2020-04-06 MED ORDER — PYRIDOSTIGMINE BROMIDE 60 MG PO TABS
60.0000 mg | ORAL_TABLET | Freq: Three times a day (TID) | ORAL | Status: DC
  Administered 2020-04-06 – 2020-04-13 (×20): 60 mg via ORAL
  Filled 2020-04-06 (×23): qty 1

## 2020-04-06 MED ORDER — PANTOPRAZOLE SODIUM 40 MG PO TBEC
40.0000 mg | DELAYED_RELEASE_TABLET | Freq: Every day | ORAL | Status: DC
Start: 2020-04-07 — End: 2020-04-13
  Administered 2020-04-07 – 2020-04-13 (×7): 40 mg via ORAL
  Filled 2020-04-06 (×7): qty 1

## 2020-04-06 MED ORDER — ACETAMINOPHEN 325 MG PO TABS
650.0000 mg | ORAL_TABLET | Freq: Four times a day (QID) | ORAL | Status: DC | PRN
  Administered 2020-04-07 – 2020-04-12 (×6): 650 mg via ORAL
  Filled 2020-04-06 (×6): qty 2

## 2020-04-06 MED ORDER — METHYLPREDNISOLONE SODIUM SUCC 125 MG IJ SOLR: 125.0000 mg | Freq: Once | INTRAMUSCULAR | Status: DC

## 2020-04-06 MED ORDER — VANCOMYCIN HCL IN DEXTROSE 1-5 GM/200ML-% IV SOLN
1000.0000 mg | INTRAVENOUS | Status: DC
  Filled 2020-04-06: qty 200

## 2020-04-06 MED ORDER — SODIUM CHLORIDE 0.9 % IV SOLN
2.0000 g | Freq: Once | INTRAVENOUS | Status: AC
  Administered 2020-04-06: 2 g via INTRAVENOUS
  Filled 2020-04-06: qty 2

## 2020-04-06 NOTE — ED Provider Notes (Addendum)
[facility] Emergency Department Provider Note  ____________________________________________   Event Date/Time   First MD Initiated Contact with Patient 04/06/20 1311     (approximate)  I have reviewed the triage vital signs and the nursing notes.   HISTORY  Chief Complaint Shortness of Breath and Dysphagia    HPI Madison Burns is a 70 y.o. female  With PMHx myasthenia, HLD, cognitive impairment, here with SOB.  History is provided primarily by the patient's sister.  Per report, the patient has had increasing shortness of breath over the last 2 days. She has had cough and weakness. She has also begun having increased difficulty swallowing over the past 24 hours, though she was able to take her pills today and has been able to drink water. Pt has h/o myasthenia as well as recurrent PNA. No known sick contacts. She called her Neurologist who was going to call in prednisone 10 mg but pt has not started this today.  Level 5 caveat invoked as remainder of history, ROS, and physical exam limited due to patient's cognitive impairment.        Past Medical History:  Diagnosis Date  . Cancer of kidney ([place]) 2021  . Complication of anesthesia    Myasthenia gravis  . GERD (gastroesophageal reflux disease)   . History of seizure disorder   . Hypercholesterolemia   . Hypertension    no longer has it. wt loss  . Hypothyroidism   . Irritable bowel syndrome   . Myasthenia gravis ([place]) 2011  . OSA on CPAP   . Skin cancer 2017   Melanoma on neck  . Thymoma, malignant [facility])     Patient Active Problem List   Diagnosis Date Noted  . Myasthenia gravis with (acute) exacerbation ([place]) 11/15/2019  . Anemia due to chemotherapy for renal cell cancer treated with erythropoietin ([place]) 11/15/2019  . Malignant thymoma ([place]) 11/15/2019  . Idiopathic profound intellectual disability 11/15/2019  . Dyspnea on exertion 11/15/2019  . Acute respiratory failure with hypoxia  ([place])   . Palliative care by specialist   . Acute respiratory distress   . Pressure injury of skin 10/24/2019  . Atrial fibrillation with RVR ([place]) 10/23/2019  . Community acquired pneumonia 10/23/2019  . Acute respiratory failure with hypoxia and hypercapnia (HCC)   . Sepsis ([place]) 10/22/2019  . Hypothyroidism   . Left renal mass 08/17/2019  . Left shoulder pain 07/18/2019  . Mediastinal mass 04/11/2019  . Renal cell cancer, left ([place]) 03/09/2019  . Thymoma 03/09/2019  . Chest pain 12/05/2018  . Fullness of breast 11/30/2018  . History of seizure disorder 01/05/2018  . Gastroesophagitis 07/02/2017  . Cough 04/25/2017  . 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 OSA (obstructive sleep apnea) 02/23/2017  . Mood disorder ([place]) 10/01/2016  . Pain in right toe(s) 06/17/2015  . MG (myasthenia gravis) ([place]) 06/06/2015  . OSA on CPAP 06/06/2015  . Dysuria 02/12/2015  . Goals of care, counseling/discussion 06/12/2014  . Obstructive sleep apnea syndrome 05/03/2014  . MG with exacerbation (myasthenia gravis) ([place]) 04/10/2014  . Hypersomnia with sleep apnea 04/10/2014  . Obesity (BMI 30-39.9) 01/29/2014  . Hyperglycemia 01/29/2014  . Abdominal pain 01/29/2014  . Melanoma ([place]) 01/23/2013  . Hypercholesterolemia 12/21/2011  . Hypertension 12/21/2011  . Myasthenia gravis ([place]) 12/21/2011  . Seizure disorder ([place]) 12/21/2011  . Irritable bowel 12/21/2011    Past Surgical History:  Procedure Laterality Date  . ABDOMINAL HYSTERECTOMY  1990  . APPENDECTOMY  1999  .  CHOLECYSTECTOMY  2013  . COLONOSCOPY WITH PROPOFOL N/A 06/30/2017   Procedure: COLONOSCOPY WITH PROPOFOL;  Surgeon: Toledo, Benay Pike, MD;  Location: [facility] ENDOSCOPY;  Service: Gastroenterology;  Laterality: N/A;  . CYSTOSCOPY W/ URETERAL STENT PLACEMENT Left 08/17/2019   Procedure: CYSTOSCOPY WITH RETROGRADE PYELOGRAM/URETERAL STENT PLACEMENT;  Surgeon: Alexis Frock, MD;  Location: [facility] ORS;  Service: Urology;   Laterality: Left;  30 MINS  . ESOPHAGOGASTRODUODENOSCOPY (EGD) WITH PROPOFOL N/A 06/30/2017   Procedure: ESOPHAGOGASTRODUODENOSCOPY (EGD) WITH PROPOFOL;  Surgeon: Toledo, Benay Pike, MD;  Location: [facility] ENDOSCOPY;  Service: Gastroenterology;  Laterality: N/A;  . IR RADIOLOGIST EVAL & MGMT  06/28/2019  . IR RADIOLOGIST EVAL & MGMT  09/14/2019  . IR RADIOLOGIST EVAL & MGMT  03/28/2020  . RADIOFREQUENCY ABLATION Left 08/17/2019   Procedure: LEFT RENAL CRYO ABLATION;  Surgeon: Jacqulynn Cadet, MD;  Location: [facility] ORS;  Service: Anesthesiology;  Laterality: Left;  . TONSILLECTOMY  1959    Prior to Admission medications   Medication Sig Start Date End Date Taking? Authorizing Provider  Ascorbic Acid (VITAMIN C) 1000 MG tablet Take 1,000 mg by mouth at bedtime.    [provider]  B Complex-C (B-COMPLEX WITH VITAMIN C) tablet Take 1 tablet by mouth every evening.    [provider]  benzonatate (TESSALON) 100 MG capsule TAKE 1 CAPSULE BY MOUTH TWICE DAILY AS NEEDED COUGH 01/16/20   Einar Pheasant, MD  Biotin 10000 MCG TABS Take 10,000 mcg by mouth daily at 2 PM. (1430)    [provider]  Calcium-Phosphorus-Vitamin D (CITRACAL +D3 PO) Take 1 tablet by mouth daily.    [provider]  diltiazem (CARDIZEM CD) 180 MG 24 hr capsule Take 1 capsule (180 mg total) by mouth daily. 10/29/19   Ezekiel Slocumb, DO  DULoxetine (CYMBALTA) 30 MG capsule TAKE 1 CAPSULE BY MOUTH ONCE DAILY 03/09/20   Earlie Server, MD  folic acid (FOLVITE) 1 MG tablet 2 mg every day po 11/15/19   Dohmeier, Asencion Partridge, MD  Garlic 4401 MG CAPS Take 1,000 mg by mouth in the morning, at noon, and at bedtime. (0800, 1430, 2000)    [provider]  ketoconazole (NIZORAL) 2 % cream SMARTSIG:1 Application Topical 1 to 2 Times Daily 09/27/19   [provider]  levothyroxine (SYNTHROID) 75 MCG tablet TAKE 1 TABLET BY MOUTH DAILY ON AN EMPTYSTOMACH. WAIT 30 MINUTES BEFORE TAKING OTHER MEDS 02/27/20   Einar Pheasant, MD  methotrexate 2.5 MG tablet TAKE 4 TABLETS BY MOUTH BY MOUTH ONCE A WEEK WITH FOLIC ACID 0/27/25   Lomax, Amy, NP  pantoprazole (PROTONIX) 40 MG tablet Take 40 mg by mouth daily.  11/24/17   [provider]  predniSONE (DELTASONE) 10 MG tablet Take one in AM and one at lunch for breathing relief 04/06/20   Dohmeier, Asencion Partridge, MD  Probiotic Product (PROBIOTIC MULTI-ENZYME PO) Take 1 capsule by mouth in the morning and at bedtime. Probiotic Multi-Enzyme Digestive Formula    [provider]  pyridostigmine (MESTINON) 60 MG tablet TAKE 1 TABLET BY MOUTH AT  8 AM, ONE AT LUNCH AND ONE AT [facility] 11/15/19   Dohmeier, Asencion Partridge, MD  risperiDONE (RISPERDAL) 1 MG tablet Take 1 mg by mouth at bedtime.  07/08/18   [provider]  sucralfate (CARAFATE) 1 g tablet Take 1 g by mouth 2 (two) times daily. (0800 & 1700) 05/25/17   [provider]  tolterodine (DETROL LA) 4 MG 24 hr capsule TAKE 1 CAPSULE BY MOUTH ONCE EVERY  MORNING 01/16/20   Einar Pheasant, MD  vitamin E 180 MG (400 UNITS) capsule Take 400 Units by mouth daily at 2 PM. 1430    [provider]    Allergies Patient has no known allergies.  Family History  Problem Relation Age of Onset  . Colon cancer Maternal Grandfather        stomach/colon  . Heart disease Father        myocardial infarction  . Hyperlipidemia Father   . Hyperlipidemia Sister   . Diabetes Sister   . Bone cancer Other        cousin  . Alzheimer's disease Mother        maternal aunts  . Skin cancer Mother   . Myasthenia gravis Brother        ocular  . Skin cancer Brother   . Stroke Paternal Grandfather   . Breast cancer Neg Hx     Social History Social History   Tobacco Use  . Smoking status: Never Smoker  . Smokeless tobacco: Never Used  Vaping Use  . Vaping Use: Never used  Substance Use Topics  . Alcohol use: No    Alcohol/week: 0.0 standard drinks  . Drug use: No    Review of Systems  Review of Systems   Constitutional: Positive for fatigue. Negative for chills and fever.  HENT: Negative for sore throat.   Respiratory: Positive for cough and shortness of breath.   Cardiovascular: Negative for chest pain.  Gastrointestinal: Negative for abdominal pain.  Genitourinary: Negative for flank pain.  Musculoskeletal: Negative for neck pain.  Skin: Negative for rash and wound.  Allergic/Immunologic: Negative for immunocompromised state.  Neurological: Negative for weakness and numbness.  Hematological: Does not bruise/bleed easily.  All other systems reviewed and are negative.    ____________________________________________  PHYSICAL EXAM:      VITAL SIGNS: ED Triage Vitals  Enc Vitals Group     BP 04/06/20 1121 132/83     Pulse Rate 04/06/20 1121 (!) 117     Resp 04/06/20 1121 20     Temp 04/06/20 1121 99 F (37.2 C)     Temp Source 04/06/20 1121 Oral     SpO2 04/06/20 1121 93 %     Weight --      Height --      Head Circumference --      Peak Flow --      Pain Score 04/06/20 1122 0     Pain Loc --      Pain Edu? --      Excl. in [place]? --      Physical Exam Vitals and nursing note reviewed.  Constitutional:      General: She is not in acute distress.    Appearance: She is well-developed.  HENT:     Head: Normocephalic and atraumatic.  Eyes:     Conjunctiva/sclera: Conjunctivae normal.  Cardiovascular:     Rate and Rhythm: Regular rhythm. Tachycardia present.     Heart sounds: Normal heart sounds. No murmur heard. No friction rub.  Pulmonary:     Effort: Pulmonary effort is normal. Tachypnea present. No respiratory distress.     Breath sounds: Examination of the right-lower field reveals rales. Examination of the left-lower field reveals rales. Rales present. No wheezing.  Abdominal:     General: There is no distension.     Palpations: Abdomen is soft.     Tenderness: There is no abdominal tenderness.  Musculoskeletal:  Cervical back: Neck supple.  Skin:     General: Skin is warm.     Capillary Refill: Capillary refill takes less than 2 seconds.  Neurological:     Mental Status: She is alert and oriented to person, place, and time.     Motor: No abnormal muscle tone.       ____________________________________________   LABS (all labs ordered are listed, but only abnormal results are displayed)  Labs Reviewed  CBC WITH DIFFERENTIAL/PLATELET - Abnormal; Notable for the following components:      Result Value   WBC 17.3 (*)    RBC 3.69 (*)    Hemoglobin 11.2 (*)    HCT 34.1 (*)    Neutro Abs 15.2 (*)    Lymphs Abs 0.4 (*)    Monocytes Absolute 1.5 (*)    Abs Immature Granulocytes 0.15 (*)    All other components within normal limits  COMPREHENSIVE METABOLIC PANEL - Abnormal; Notable for the following components:   Sodium 126 (*)    Chloride 92 (*)    Glucose, Bld 153 (*)    Total Protein 6.3 (*)    Albumin 3.1 (*)    All other components within normal limits  BLOOD GAS, VENOUS - Abnormal; Notable for the following components:   pCO2, Ven 41 (*)    All other components within normal limits  CULTURE, BLOOD (ROUTINE X 2)  CULTURE, BLOOD (ROUTINE X 2)  RESP PANEL BY RT-PCR (FLU A&B, COVID) ARPGX2  MRSA PCR SCREENING  LACTIC ACID, PLASMA  LACTIC ACID, PLASMA    ____________________________________________  EKG: Sinus tachycardia, VR 117. PR 138, QRS 86, QTc 407. No acute St elevations or depressions. No ischemia or infarct. ________________________________________  RADIOLOGY All imaging, including plain films, CT scans, and ultrasounds, independently reviewed by me, and interpretations confirmed via formal radiology reads.  ED MD interpretation:   CXR: L basilar PNA/infiltrate with effusion  Official radiology report(s): DG Chest 2 View  Result Date: 04/06/2020 CLINICAL DATA:  Shortness of breath.  Chest pain. EXAM: CHEST - 2 VIEW COMPARISON:  November 04, 2019. FINDINGS: Mild cardiomegaly is noted with central  pulmonary vascular congestion. No pneumothorax is noted. Left basilar atelectasis is noted with small left pleural effusion. Stable elevated left hemidiaphragm. Bony thorax is unremarkable. IMPRESSION: Stable elevated left hemidiaphragm. Left basilar atelectasis with small left pleural effusion. Electronically Signed   By: Marijo Conception M.D.   On: 04/06/2020 11:56    ____________________________________________  PROCEDURES   Procedure(s) performed (including Critical Care):  .Critical Care Performed by: Duffy Bruce, MD Authorized by: Duffy Bruce, MD   Critical care provider statement:    Critical care time (minutes):  35   Critical care time was exclusive of:  Separately billable procedures and treating other patients and teaching time   Critical care was necessary to treat or prevent imminent or life-threatening deterioration of the following conditions:  Cardiac failure, circulatory failure and respiratory failure   Critical care was time spent personally by me on the following activities:  Development of treatment plan with patient or surrogate, discussions with consultants, evaluation of patient's response to treatment, examination of patient, obtaining history from patient or surrogate, ordering and performing treatments and interventions, ordering and review of laboratory studies, ordering and review of radiographic studies, pulse oximetry, re-evaluation of patient's condition and review of old charts   I assumed direction of critical care for this patient from another provider in my specialty: no    .1-3 Lead EKG  Interpretation Performed by: Duffy Bruce, MD Authorized by: Duffy Bruce, MD     Interpretation: abnormal     ECG rate:  90-110   ECG rate assessment: tachycardic     Rhythm: sinus tachycardia     Ectopy: none     Conduction: normal   Comments:     Indication: SOB    ____________________________________________  INITIAL IMPRESSION / MDM /  ASSESSMENT AND PLAN / ED COURSE  As part of my medical decision making, I reviewed the following data within the [place] notes reviewed and incorporated, Old chart reviewed, Notes from prior ED visits, and [place] Controlled Substance Database       *Madison Burns was evaluated in Emergency Department on 04/06/2020 for the symptoms described in the history of present illness. She was evaluated in the context of the global COVID-19 pandemic, which necessitated consideration that the patient might be at risk for infection with the SARS-CoV-2 virus that causes COVID-19. Institutional protocols and algorithms that pertain to the evaluation of patients at risk for COVID-19 are in a state of rapid change based on information released by regulatory bodies including the CDC and federal and state organizations. These policies and algorithms were followed during the patient's care in the ED.  Some ED evaluations and interventions may be delayed as a result of limited staffing during the pandemic.*     Medical Decision Making:  70 yo F here with cough, SOB. On arrival, pt tachypneic, mildly hypoxic, but mentating at baseline. Tolerating liquids, but having difficulty with solids. resp assessment somewhat limited 2/2 cognitive impairment, NIF -10 though unable to fully participate. Blood gas is w/o retention. CXR reviewed and shows possible L basilar PNA. Labs show leukocytosis and mild acute on chronic hyponatremia. Will start on empiric ABX for immunosuppressed pneumonia, admit to medicine. Dr. Frederick Peers with Neurology consulted. Will place on BIPAP for her WOB. Admit to step down.  ____________________________________________  FINAL CLINICAL IMPRESSION(S) / ED DIAGNOSES  Final diagnoses:  Myasthenia ([place])  Community acquired pneumonia of left lower lobe of lung  Immunosuppression ([place])     MEDICATIONS GIVEN DURING THIS VISIT:  Medications  vancomycin (VANCOREADY) IVPB 2000  mg/400 mL (has no administration in time range)  ceFEPIme (MAXIPIME) 2 g in sodium chloride 0.9 % 100 mL IVPB (has no administration in time range)  pyridostigmine (MESTINON) tablet 60 mg (has no administration in time range)     ED Discharge Orders    None       Note:  This document was prepared using Dragon voice recognition software and may include unintentional dictation errors.   Duffy Bruce, MD 04/06/20 1418    Duffy Bruce, MD 04/06/20 [phone]

## 2020-04-06 NOTE — Progress Notes (Signed)
Pt's NIF is -10. This was the best of four.

## 2020-04-06 NOTE — Consult Note (Signed)
Pharmacy Antibiotic Note  Madison Burns is a 70 y.o. female admitted on 04/06/2020 with pneumonia. PMH significant for GERD, hx of seizure, HLD, HTN, Hypothyroidism, Myasthenia gravis on Methotrexate who presents with increasing SOB over the last 2 days with cough + weakness and difficulty swallowing for a day. Pharmacy has been consulted for cefepime and vancomycin dosing.  Plan: Cefepime 2 g IV q12h  Vancomycin 2 g IV x 1 in the ED, followed by 1 g IV q24h. Goal AUC 400-550 Estimated AUC: 534 (Scr: 0.99 and Vd: 0.5) Monitor clinical picture and renal function F/U C&S, abx deescalation / LOT MRSA PCR ordered; f/u and narrow vanc as appropriate   Temp (24hrs), Avg:99 F (37.2 C), Min:99 F (37.2 C), Max:99 F (37.2 C)  Recent Labs  Lab 04/06/20 1124 04/06/20 1350  WBC 17.3*  --   CREATININE 0.99  --   LATICACIDVEN  --  1.5    Estimated Creatinine Clearance: 57.8 mL/min (by C-G formula based on SCr of 0.99 mg/dL).    No Known Allergies  Antimicrobials this admission: 2/18 cefepime >> 2/18 vancomycin >>   Dose adjustments this admission:  Microbiology results: 2/18 BCx: pending 2/18 MRSA PCR: pending  Thank you for allowing pharmacy to be a part of this patient's care.  Darnelle Bos, PharmD 04/06/2020 4:54 PM

## 2020-04-06 NOTE — ED Notes (Signed)
Neurologist at bedside. 

## 2020-04-06 NOTE — Telephone Encounter (Signed)
Spoke with patient sister and she is going to take her on over to the ER.

## 2020-04-06 NOTE — H&P (Signed)
History and Physical    PLEASE NOTE THAT DRAGON DICTATION SOFTWARE WAS USED IN THE CONSTRUCTION OF THIS NOTE.   Madison Burns UMP:[id number] DOB: Oct 12, 1950 DOA: 04/06/2020  PCP: Einar Pheasant, MD Patient coming from: home   I have personally briefly reviewed patient's old medical records in [place]  Chief Complaint: Shortness of breath  HPI: Madison Burns is a 70 y.o. female with medical history significant for myasthenia gravis, cognitive impairment, acquired hypothyroidism, obstructive sleep apnea compliant on home nocturnal CPAP, hypertension, hyperlipidemia, who is admitted to [facility] on 04/06/2020 with sepsis due to left basilar pneumonia after presenting from home to [facility] ED complaining of shortness of breath.   In the setting of the patient's baseline cognitive impairment, the following history is provided by the patient's sister, with whom the patient lives, and who is present at bedside, in addition to my discussions with the physician, and via chart review.  The patient's niece reports that the patient has been experiencing 2 days of progressive shortness of breath associated with new onset nonproductive cough as well as a relative increase in her baseline body temperature over that time.  She also reports that the patient is exhibited generalized, nonfocal weakness over the last 1 to 2 days following onset of shortness of breath.  She reports that this constellation of symptoms is very similar to that with which the patient presented to [facility] in September 2021, at which time she was hospitalized for pneumonia and managed on the BiPAP in the setting of associated myasthenia gravis exacerbation.  This represents the patient's most recent hospitalization.  No recent nausea, vomiting, diarrhea, melena, or hematochezia.  Patient has reportedly recently been experiencing " a tingling sensation" with urination, it is not associate with overt dysuria.   Additionally, no recent [place] hematuria or change in urinary urgency/frequency.  No recent trauma.  Sister reports that the patient has not complained of any recent headache, neck stiffness, rhinitis, rhinorrhea, sore throat.  No known recent COVID-19 exposures.  Recent shortness of breath is not been associated with any worsening of peripheral edema, no complaints of chest pain for the patient.  Sister confirms that the patient is currently at her baseline mental status.    Medical history notable for myasthenia gravis, for which the patient is on q. weekly methotrexate as well as daily Mestinon.     ED Course:  Vital signs in the ED were notable for the following: Temperature max 99, heart rate 10 2-1 17; blood pressure 112/76 - 132/83; respiratory rate 20-29, initial oxygen saturation on room air noted to be 93%, with ensuing initiation of BiPAP in the context of increased work of breathing, with ensuing oxygen saturations noted to be 99 to 100% on BiPAP with settings of 10/5 and 35% FiO2.  Labs were notable for the following: CMP was notable for the following: Sodium 126, which is relative to most recent prior value of 138 on 03/30/2020, chloride 92, bicarbonate 25, creatinine 0.99 relative to most recent prior value of 1.16 on 03/30/2020.  CBC notable for white blood cell count of 17,000 with 88% neutrophils.  Lactic acid 1.5.  Urinalysis has been ordered, with result currently pending.  Additionally, nasal mucosa MRSA PCR has been ordered, with results currently pending.  Nasopharyngeal COVID-19 PCR was checked in the ED this evening, and found to be negative.  Blood cultures x2 were collected prior to initiation of any antibiotics.  2 view chest x-ray showed left basilar  airspace opacity concerning for pneumonia in the absence of evidence of edema, effusion, or pneumothorax.  The patient's case was discussed with the on-call neurologist, Dr. Frederick Peers, who has agreed to formally consult for  evaluation myasthenia gravis exacerbation. Following his formal evaluation of the patient, Dr. Frederick Peers feels that the patient is exhibiting evidence of exacerbation of her myasthenia gravis as a consequence of presenting pneumonia, similar to that documented at the time of her most recent prior hospitalization in September 2021.  Consequently, Dr.**Has made the following recommendations with associated orders placed: Initiation of IVIG, every 6 hours NIF and vital capacity checks, Mestinon 60 mg p.o. 3 times daily, recommendation for initial continuation of q. weekly methotrexate, but with the possibility of subsequently discontinuing this medication interval worsening of underlying pneumonia.  He also recommended initiation of diet upon the patient passing nursing bedside swallow screen, and also recommended refraining from systemic corticosteroids at this time.  While in the ED, the following were administered: Mestinon 60 mg p.o. x1, cefepime 2 g IV x1, and a dose of IV vancomycin.  Subsequently, the patient was admitted to the stepdown unit for further evaluation and management of sepsis due to left basilar pneumonia with consequential myasthenia gravis exacerbation.     Review of Systems: As per HPI otherwise 10 point review of systems negative.   Past Medical History:  Diagnosis Date  . Cancer of kidney ([place]) 2021  . Complication of anesthesia    Myasthenia gravis  . GERD (gastroesophageal reflux disease)   . History of seizure disorder   . Hypercholesterolemia   . Hypertension    no longer has it. wt loss  . Hypothyroidism   . Irritable bowel syndrome   . Myasthenia gravis ([place]) 2011  . OSA on CPAP   . Skin cancer 2017   Melanoma on neck  . Thymoma, malignant [facility])     Past Surgical History:  Procedure Laterality Date  . ABDOMINAL HYSTERECTOMY  1990  . APPENDECTOMY  1999  . CHOLECYSTECTOMY  2013  . COLONOSCOPY WITH PROPOFOL N/A 06/30/2017   Procedure: COLONOSCOPY WITH  PROPOFOL;  Surgeon: Toledo, Benay Pike, MD;  Location: [facility] ENDOSCOPY;  Service: Gastroenterology;  Laterality: N/A;  . CYSTOSCOPY W/ URETERAL STENT PLACEMENT Left 08/17/2019   Procedure: CYSTOSCOPY WITH RETROGRADE PYELOGRAM/URETERAL STENT PLACEMENT;  Surgeon: Alexis Frock, MD;  Location: [facility] ORS;  Service: Urology;  Laterality: Left;  30 MINS  . ESOPHAGOGASTRODUODENOSCOPY (EGD) WITH PROPOFOL N/A 06/30/2017   Procedure: ESOPHAGOGASTRODUODENOSCOPY (EGD) WITH PROPOFOL;  Surgeon: Toledo, Benay Pike, MD;  Location: [facility] ENDOSCOPY;  Service: Gastroenterology;  Laterality: N/A;  . IR RADIOLOGIST EVAL & MGMT  06/28/2019  . IR RADIOLOGIST EVAL & MGMT  09/14/2019  . IR RADIOLOGIST EVAL & MGMT  03/28/2020  . RADIOFREQUENCY ABLATION Left 08/17/2019   Procedure: LEFT RENAL CRYO ABLATION;  Surgeon: Jacqulynn Cadet, MD;  Location: [facility] ORS;  Service: Anesthesiology;  Laterality: Left;  . TONSILLECTOMY  1959    Social History:  reports that she has never smoked. She has never used smokeless tobacco. She reports that she does not drink alcohol and does not use drugs.   No Known Allergies  Family History  Problem Relation Age of Onset  . Colon cancer Maternal Grandfather        stomach/colon  . Heart disease Father        myocardial infarction  . Hyperlipidemia Father   . Hyperlipidemia Sister   . Diabetes Sister   . Bone cancer Other  cousin  . Alzheimer's disease Mother        maternal aunts  . Skin cancer Mother   . Myasthenia gravis Brother        ocular  . Skin cancer Brother   . Stroke Paternal Grandfather   . Breast cancer Neg Hx      Prior to Admission medications   Medication Sig Start Date End Date Taking? Authorizing Provider  Ascorbic Acid (VITAMIN C) 1000 MG tablet Take 1,000 mg by mouth at bedtime.    [provider]  B Complex-C (B-COMPLEX WITH VITAMIN C) tablet Take 1 tablet by mouth every evening.    [provider]  Biotin 10000 MCG TABS Take 10,000 mcg  by mouth daily at 2 PM. (1430)    [provider]  Calcium-Phosphorus-Vitamin D (CITRACAL +D3 PO) Take 1 tablet by mouth daily.    [provider]  diltiazem (CARDIZEM CD) 180 MG 24 hr capsule Take 1 capsule (180 mg total) by mouth daily. 10/29/19   Ezekiel Slocumb, DO  DULoxetine (CYMBALTA) 30 MG capsule TAKE 1 CAPSULE BY MOUTH ONCE DAILY Patient taking differently: Take 30 mg by mouth daily. 03/09/20   Earlie Server, MD  folic acid (FOLVITE) 1 MG tablet 2 mg every day po 11/15/19   Dohmeier, Asencion Partridge, MD  Garlic 5697 MG CAPS Take 1,000 mg by mouth in the morning, at noon, and at bedtime. (0800, 1430, 2000)    [provider]  levothyroxine (SYNTHROID) 75 MCG tablet TAKE 1 TABLET BY MOUTH DAILY ON AN Van. WAIT [place] OTHER MEDS Patient taking differently: Take 75 mcg by mouth daily before breakfast. 02/27/20   Einar Pheasant, MD  methotrexate 2.5 MG tablet TAKE 4 TABLETS BY MOUTH BY MOUTH ONCE A WEEK WITH FOLIC ACID Patient taking differently: Take 10 mg by mouth once a week. TAKE 4 TABLETS BY MOUTH BY MOUTH ONCE A WEEK WITH FOLIC ACID 9/48/01   Lomax, Amy, NP  pantoprazole (PROTONIX) 40 MG tablet Take 40 mg by mouth daily.  11/24/17   [provider]  predniSONE (DELTASONE) 10 MG tablet Take one in AM and one at lunch for breathing relief Patient taking differently: Take 10 mg by mouth 2 (two) times daily with a meal. Take one in AM and one at lunch for breathing relief 04/06/20   Dohmeier, Asencion Partridge, MD  Probiotic Product (PROBIOTIC MULTI-ENZYME PO) Take 1 capsule by mouth in the morning and at bedtime. Probiotic Multi-Enzyme Digestive Formula    [provider]  pyridostigmine (MESTINON) 60 MG tablet TAKE 1 TABLET BY MOUTH AT  8 AM, ONE AT LUNCH AND ONE AT 8PM Patient taking differently: Take 60 mg by mouth 3 (three) times daily. TAKE 1 TABLET BY MOUTH AT  8 AM, ONE AT LUNCH AND ONE AT [facility] 11/15/19   Dohmeier, Asencion Partridge, MD  risperiDONE  (RISPERDAL) 1 MG tablet Take 1 mg by mouth at bedtime.  07/08/18   [provider]  sucralfate (CARAFATE) 1 g tablet Take 1 g by mouth 2 (two) times daily. (0800 & 1700) 05/25/17   [provider]  tolterodine (DETROL LA) 4 MG 24 hr capsule TAKE 1 CAPSULE BY MOUTH ONCE EVERY MORNING Patient taking differently: Take 4 mg by mouth in the morning. 01/16/20   Einar Pheasant, MD  vitamin E 180 MG (400 UNITS) capsule Take 400 Units by mouth daily at 2 PM. 1430    [provider]     Objective  Physical Exam: Vitals:   04/06/20 1121 04/06/20 1400 04/06/20 1430  BP: 132/83 112/76 119/73  Pulse: (!) 117 (!) 102 (!) 102  Resp: 20 (!) 29 (!) 30  Temp: 99 F (37.2 C)    TempSrc: Oral    SpO2: 93% 95% 99%    General: appears to be stated age; alert; cognition at baseline in the setting of reported chronic cognitive impairment; currently on BiPAP Skin: warm, dry, no rash Head:  AT/[place] Mouth:  Oral mucosa membranes appear moist, normal dentition Neck: supple; trachea midline Heart:  RRR; did not appreciate any M/R/G Lungs: CTAB, did not appreciate any wheezes, rales, or rhonchi Abdomen: + BS; soft, ND, NT Vascular: 2+ pedal pulses b/l; 2+ radial pulses b/l Extremities: no peripheral edema, no muscle wasting   Labs on Admission: I have personally reviewed following labs and imaging studies  CBC: Recent Labs  Lab 04/06/20 1124  WBC 17.3*  NEUTROABS 15.2*  HGB 11.2*  HCT 34.1*  MCV 92.4  PLT 734   Basic Metabolic Panel: Recent Labs  Lab 04/06/20 1124  NA 126*  K 4.1  CL 92*  CO2 25  GLUCOSE 153*  BUN 21  CREATININE 0.99  CALCIUM 9.0   GFR: Estimated Creatinine Clearance: 57.8 mL/min (by C-G formula based on SCr of 0.99 mg/dL). Liver Function Tests: Recent Labs  Lab 04/06/20 1124  AST 28  ALT 29  ALKPHOS 66  BILITOT 0.8  PROT 6.3*  ALBUMIN 3.1*   No results for input(s): LIPASE, AMYLASE in the last 168 hours. No results for input(s):  AMMONIA in the last 168 hours. Coagulation Profile: No results for input(s): INR, PROTIME in the last 168 hours. Cardiac Enzymes: No results for input(s): CKTOTAL, CKMB, CKMBINDEX, TROPONINI in the last 168 hours. BNP (last 3 results) No results for input(s): PROBNP in the last 8760 hours. HbA1C: No results for input(s): HGBA1C in the last 72 hours. CBG: No results for input(s): GLUCAP in the last 168 hours. Lipid Profile: No results for input(s): CHOL, HDL, LDLCALC, TRIG, CHOLHDL, LDLDIRECT in the last 72 hours. Thyroid Function Tests: No results for input(s): TSH, T4TOTAL, FREET4, T3FREE, THYROIDAB in the last 72 hours. Anemia Panel: No results for input(s): VITAMINB12, FOLATE, FERRITIN, TIBC, IRON, RETICCTPCT in the last 72 hours. Urine analysis:    Component Value Date/Time   COLORURINE AMBER (A) 10/22/2019 0834   APPEARANCEUR CLOUDY (A) 10/22/2019 0834   APPEARANCEUR Cloudy (A) 09/07/2019 1151   LABSPEC 1.040 (H) 10/22/2019 0834   LABSPEC 1.019 04/25/2011 0854   PHURINE 5.0 10/22/2019 0834   GLUCOSEU NEGATIVE 10/22/2019 0834   GLUCOSEU NEGATIVE 02/13/2016 1109   HGBUR NEGATIVE 10/22/2019 0834   BILIRUBINUR NEGATIVE 10/22/2019 0834   BILIRUBINUR neg 02/07/2015 1520   BILIRUBINUR Negative 04/25/2011 0854   KETONESUR NEGATIVE 10/22/2019 0834   PROTEINUR >=300 (A) 10/22/2019 0834   UROBILINOGEN 0.2 02/13/2016 1109   NITRITE NEGATIVE 10/22/2019 0834   LEUKOCYTESUR TRACE (A) 10/22/2019 0834   LEUKOCYTESUR 2+ 04/25/2011 0854    Radiological Exams on Admission: DG Chest 2 View  Result Date: 04/06/2020 CLINICAL DATA:  Shortness of breath.  Chest pain. EXAM: CHEST - 2 VIEW COMPARISON:  November 04, 2019. FINDINGS: Mild cardiomegaly is noted with central pulmonary vascular congestion. No pneumothorax is noted. Left basilar atelectasis is noted with small left pleural effusion. Stable elevated left hemidiaphragm. Bony thorax is unremarkable. IMPRESSION: Stable elevated left  hemidiaphragm. Left basilar atelectasis with small left pleural effusion. Electronically Signed   By: Jeneen Rinks  Murlean Caller M.D.   On: 04/06/2020 11:56     Assessment/Plan   DOSSIE OCANAS is a 70 y.o. female with medical history significant for myasthenia gravis, cognitive impairment, acquired hypothyroidism, obstructive sleep apnea compliant on home nocturnal CPAP, hypertension, hyperlipidemia, who is admitted to [facility] on 04/06/2020 with sepsis due to left basilar pneumonia after presenting from home to [facility] ED complaining of shortness of breath.     Principal Problem:   CAP (community acquired pneumonia) Active Problems:   Hypertension   Myasthenia gravis ([place])   MG with exacerbation (myasthenia gravis) ([place])   Hypothyroidism   Shortness of breath   Acute hyponatremia    #) Sepsis due to left basilar pneumonia: Diagnosis on the basis of 2 days of progressive shortness of breath associated with new onset nonproductive cough, subjective fever, with presenting chest x-ray showing evidence of left basilar airspace opacity concerning for pneumonia.  SIRS criteria met via presenting leukocytosis, tachycardia, and tachypnea.  Patient sepsis does not meet criteria to be considered severe in nature in the absence of any associated endorgan damage, including presenting nonelevated lactic acid of 1.5.  Additionally, no evidence of associated hypotension thus far.  Given the timing of most recent hospitalization in September 2021, labeling of presenting pneumonia would be more consistent with community-acquired pneumonia, as opposed to HCAP.  However, presentation is complicated by myasthenia gravis exacerbation that is a consequence of patient's pneumonia, the more aggressive and provision of initial broad-spectrum antibiotic coverage.  As result, we will continue the cefepime and IV vancomycin that were initiated in the ED this evening.  No evidence of additional underlying  infectious process at this time, including screening nasopharyngeal COVID-19 PCR performed in the ED today found to be negative.  Of note, urinalysis has been ordered, with result currently pending. MRSA PCR, which, if negative, can consider discontinuation of IV vancomycin given the high negative predictive value for MRSA pneumonia associated with such as result.     Plan: Monitor for results of blood cultures x2 that were collected in the ED today prior to initiation of antibiotics.  Continue cefepime and IV vancomycin, as above.  Check MRSA PCR, with plan to discontinue IV vancomycin if this is found to be negative, as above.  Repeat CBC with differential in the morning.  Check urine strep pneumonia antigen.  Monitor on continuous pulse oximetry.  Noninvasive mechanical ventilation support in the form of continuation of BiPAP, upon which the patient had good response at the time of similar presentation in September 2021.  Follow for results of urinalysis.      #) Myasthenia gravis exacerbation: 1 to 2 days of generalized weakness which appears to be consistent with exacerbation of known underlying myasthenia gravis per evaluation of formally consulted Dr. Frederick Peers of neurology, his ensuing recommendations included the following: Initiation of IVIG, continuation of home Mestinon, continuation of methotrexate unless evidence of interval worsening of pneumonia, q6h nif and vc monitoring, will refraining from initiation of steroids.  Plan: IVIG, continue Mestinon, continue home methotrexate, per neurology consultation, as above. 6h nif and vc monitoring.  Continue noninvasive mechanical ventilatory support in the form of current, with VBG ordered for the morning.  Monitor continuous pulse oximetry.  Neurology formally consulted, as above.       #) Acute hypo-osmolar euvolemic hyponatremia: Presenting serum sodium found to be 126, without need for significant corrective measures due to only mild  presenting hyperglycemia, which is relative to his recent prior serum  sodium value of 138 when checked on 03/30/2020.  In the setting of clinical evidence of euvolemia, suspect contribution from SIADH in the setting of presenting acute pneumonia.  There may also be a less pronounced contribution from dehydration due to recent decline in oral intake, but I believe that this is subsidiary to the more likely SIADH contribution.  Differential also includes contribution from acquired hypothyroidism on Synthroid supplementation as an outpatient.  No evidence to suggest acute volume overload at this time.  Spectated influence from SIADH, will refrain from provision of additional IV fluids at this time pending the result of random urine sodium and random urine osmolality studies provide additional insight into potential contributions from dehydration versus SIADH, subsequent management would differ depending upon these results.   Plan: monitor strict I's and O's and daily weights.  check UA, random urine sodium, urine osmolality. refrain from additional IVF's pending these results, as above. Check serum osmolality to confirm suspected hypoosmolar etiology. Repeat BMP in the morning. Check TSH.  Work-up and management of presenting left lower lobe pneumonia, as further described above.      #) Acquired hypothyroidism: On Synthroid as an outpatient.  Continue home dose of Synthroid for now.  Will check TSH to further evaluate presenting acute hyponatremia, as further described above.      #) Essential hypertension: Outpatient antihypertensive regimen appears to be limited to diltiazem.  Blood pressures in the ED normotensive in nature, without any overt evidence of hypotension.  In the setting of presenting sepsis due to left basilar pneumonia, will hold home diltiazem for now.  Plan: Hold home diltiazem, as above.  Close monitoring of ensuing blood pressure via routine vital signs.      #) GERD: On  Protonix as an outpatient.  Plan: Continue on PPI.     DVT prophylaxis: SCDs Code Status: Per my discussions with the patient's sister, who is present at bedside, and who is the patient's POA, the patient is to be DNR, although she would be amenable to intubation in the setting of acute respiratory indication to do so Family Communication: Case was discussed with the patient's sister, who is present at bedside, as further described above Disposition Plan: Per Rounding Team Consults called: Case was discussed with the on-call neurologist, Dr. Frederick Peers, as further described above Admission status: Inpatient; stepdown     Of note, this patient was added by me to the following Admit List/Treatment Team: [email or web address].      PLEASE NOTE THAT DRAGON DICTATION SOFTWARE WAS USED IN THE CONSTRUCTION OF THIS NOTE.   Madison Burns Pager [phone] From 12PM - 12AM  Otherwise, please contact night-coverage  [email or web address] Password [facility]   04/06/2020, 2:51 PM

## 2020-04-06 NOTE — ED Notes (Signed)
Pt ate 100% of meal tray. Pt placed back on Bipap.

## 2020-04-06 NOTE — Telephone Encounter (Signed)
Patient called in about breathing and coughing makes her throat close up need something for this called in

## 2020-04-06 NOTE — Telephone Encounter (Signed)
If oxygen saturation is 92% and she is having sob and DOE, she needs to be seen.  Need to know for sure what is causing symptoms.  Will need other testing, etc.  Agree with need for evaluation now.

## 2020-04-06 NOTE — Consult Note (Addendum)
NEUROLOGY CONSULTATION NOTE   Date of service: April 06, 2020 Patient Name: Madison Burns MRN:  [id number] DOB:  [birth year] Reason for consult: "Myasthenia gravis" _ _ _   _ __   _ __ _ _  __ __   _ __   __ _  History of Present Illness  Madison Burns is a 70 y.o. female with PMH significant for GERD, hx of seizure, HLD, HTN, Hypothyroidism,  Myasthenia gravis on Methotrexate and  Mestinon who presents with increasing SOB over the last 2 days with cough + weakness and difficulty swallowing for a day. Found to have leukocytosis with WBC count of 17.3 along with low sodium of 126 and is being treated for a Pneumonia with Cefepime and Vancomycin.  We were asked to assess her for concerns for Myasthenia crisis.  Most of the history at bedside is provided by patient's sister who is also her caretaker.  Patient is on BiPAP and is not a very conversant. She reports that Madison Burns developed some shortness of breath and tiredness over the last couple days with some associated dry cough.  She did not think too much about it as Madison Burns has had some dry cough off and on.  She is also noted that Madison Burns's posture has been more droopy and she is leaning more towards her left over the last 24 hours.  Madison Burns typically is a really fast eater but she has noted that over the last day she is been having trouble with swallowing and has to slow down and eat a small amount of food 1 at a time which is very unusual for her.  She is also reported that Madison Burns is short of breath and tired after just walking over to the bathroom and then back.  This is very unusual for her.  Madison Burns has had myasthenia exacerbation in the past and most recently was in September.  Typically starts with her eyelids droopy along with drooping of her posture and her being tired and having difficulty swallowing and being somewhat short of breath.  Although Madison Burns does not endorse any drooping of her eyelids, when asked if she has other  symptoms she nods her head yes. Madison Burns also endorses some tingling when she urinates.   ROS   Constitutional Denies weight loss, fever and chills.  HEENT Denies changes in vision and hearing.  Respiratory Denies SOB and cough.  CV Denies palpitations and CP  GI Denies abdominal pain, nausea, vomiting and diarrhea.  GU endorses dysuria but no urinary frequency.  MSK Denies myalgia and joint pain.  Skin Denies rash and pruritus.  Neurological Denies headache and syncope.  Psychiatric Denies recent changes in mood. Denies anxiety and depression.   Past History   Past Medical History:  Diagnosis Date  . Cancer of kidney ([place]) 2021  . Complication of anesthesia    Myasthenia gravis  . GERD (gastroesophageal reflux disease)   . History of seizure disorder   . Hypercholesterolemia   . Hypertension    no longer has it. wt loss  . Hypothyroidism   . Irritable bowel syndrome   . Myasthenia gravis ([place]) 2011  . OSA on CPAP   . Skin cancer 2017   Melanoma on neck  . Thymoma, malignant [facility])    Past Surgical History:  Procedure Laterality Date  . ABDOMINAL HYSTERECTOMY  1990  . APPENDECTOMY  1999  . CHOLECYSTECTOMY  2013  . COLONOSCOPY WITH PROPOFOL N/A 06/30/2017   Procedure: COLONOSCOPY WITH PROPOFOL;  Surgeon: Toledo, Benay Pike, MD;  Location: [facility] ENDOSCOPY;  Service: Gastroenterology;  Laterality: N/A;  . CYSTOSCOPY W/ URETERAL STENT PLACEMENT Left 08/17/2019   Procedure: CYSTOSCOPY WITH RETROGRADE PYELOGRAM/URETERAL STENT PLACEMENT;  Surgeon: Alexis Frock, MD;  Location: [facility]S;  Service: Urology;  Laterality: Left;  30 MINS  . ESOPHAGOGASTRODUODENOSCOPY (EGD) WITH PROPOFOL N/A 06/30/2017   Procedure: ESOPHAGOGASTRODUODENOSCOPY (EGD) WITH PROPOFOL;  Surgeon: Toledo, Benay Pike, MD;  Location: [facility] ENDOSCOPY;  Service: Gastroenterology;  Laterality: N/A;  . IR RADIOLOGIST EVAL & MGMT  06/28/2019  . IR RADIOLOGIST EVAL & MGMT  09/14/2019  . IR RADIOLOGIST EVAL & MGMT  03/28/2020   . RADIOFREQUENCY ABLATION Left 08/17/2019   Procedure: LEFT RENAL CRYO ABLATION;  Surgeon: Jacqulynn Cadet, MD;  Location: [facility];  Service: Anesthesiology;  Laterality: Left;  . TONSILLECTOMY  1959   Family History  Problem Relation Age of Onset  . Colon cancer Maternal Grandfather        stomach/colon  . Heart disease Father        myocardial infarction  . Hyperlipidemia Father   . Hyperlipidemia Sister   . Diabetes Sister   . Bone cancer Other        cousin  . Alzheimer's disease Mother        maternal aunts  . Skin cancer Mother   . Myasthenia gravis Brother        ocular  . Skin cancer Brother   . Stroke Paternal Grandfather   . Breast cancer Neg Hx    Social History   Socioeconomic History  . Marital status: Single    Spouse name: Not on file  . Number of children: 0  . Years of education: Special Ed  . Highest education level: Not on file  Occupational History  . Occupation: Unemployed  Madison Burns Use  . Smoking status: Never Smoker  . Smokeless Madison Burns: Never Used  Vaping Use  . Vaping Use: Never used  Substance and Sexual Activity  . Alcohol use: No    Alcohol/week: 0.0 standard drinks  . Drug use: No  . Sexual activity: Not Currently  Other Topics Concern  . Not on file  Social History Narrative   06/23/17 lives with sister, [name]   Regular exercise-no   Caffeine Use-yes   Social Determinants of Health   Financial Resource Strain: Not on file  Food Insecurity: Not on file  Transportation Needs: Not on file  Physical Activity: Not on file  Stress: Not on file  Social Connections: Not on file   No Known Allergies  Medications  (Not in a hospital admission)    Vitals   Vitals:   04/06/20 1121  BP: 132/83  Pulse: (!) 117  Resp: 20  Temp: 99 F (37.2 C)  TempSrc: Oral  SpO2: 93%     There is no height or weight on file to calculate BMI.  Physical Exam   General: Laying comfortably in bed; in no acute distress. HENT: Normal  oropharynx and mucosa. Normal external appearance of ears and nose. Neck: Supple, no pain or tenderness  CV: No JVD. No peripheral edema.  Pulmonary: Symmetric Chest rise. Normal respiratory effort.  Abdomen: Soft to touch, non-tender.  Ext: No cyanosis, edema, or deformity  Skin: No rash. Normal palpation of skin.   Musculoskeletal: Normal digits and nails by inspection. No clubbing.   Neurologic Examination  Mental status/Cognition: Alert, oriented to self, place, month and year, good attention.  Speech/language: Fluent, comprehension intact, object naming intact, repetition  intact. Cranial nerves:   CN II Pupils equal and reactive to light, no VF deficits   CN III,IV,VI EOM intact, no gaze preference or deviation, no nystagmus   CN V normal sensation in V1, V2, and V3 segments bilaterally   CN VII no asymmetry, no nasolabial fold flattening   CN VIII normal hearing to speech   CN IX & X On Bipap and unable to evaluate palatal elevation, or uvular deviation    CN XI 5/5 head turn and 5/5 shoulder shrug bilaterally   CN XII midline tongue protrusion   Neck Strength: Neck Flexion: 4/5 Neck Extension: 4/5 Motor:  Muscle bulk: normal, tone normal  Mvmt Root Nerve  Muscle Right Left Comments  SA C5/6 Ax Deltoid 4 4   EF C5/6 Mc Biceps 4 4   EE C6/7/8 Rad Triceps 3 3   WF C6/7 Med FCR     WE C7/8 PIN ECU     F Ab C8/T1 U ADM/FDI 4 4   HF L1/2/3 Fem Illopsoas 4 4   KE L2/3/4 Fem Quad 5 5   DF L4/5 D Peron Tib Ant 5 5   PF S1/2 Tibial Grc/Sol 5 5    Reflexes:  Right Left Comments  Pectoralis      Biceps (C5/6) 1 1   Brachioradialis (C5/6) 1 1    Triceps (C6/7) 1 1    Patellar (L3/4) 2 2    Achilles (S1) 1 1    Hoffman      Plantar withdraws withdraws   Jaw jerk    Sensation:  Light touch Intact throughout   Pin prick    Temperature    Vibration   Proprioception    Coordination/Complex Motor:  - Finger to Nose intact BL - Heel to shin intact BL - Rapid  alternating movement are normal - Gait: Deferred.  Labs   CBC:  Recent Labs  Lab 04/06/20 1124  WBC 17.3*  NEUTROABS 15.2*  HGB 11.2*  HCT 34.1*  MCV 92.4  PLT 809    Basic Metabolic Panel:  Lab Results  Component Value Date   NA 126 (L) 04/06/2020   K 4.1 04/06/2020   CO2 25 04/06/2020   GLUCOSE 153 (H) 04/06/2020   BUN 21 04/06/2020   CREATININE 0.99 04/06/2020   CALCIUM 9.0 04/06/2020   GFRNONAA >60 04/06/2020   GFRAA >60 11/04/2019   Lipid Panel:  Lab Results  Component Value Date   LDLCALC 110 (H) 08/06/2018   HgbA1c:  Lab Results  Component Value Date   HGBA1C 5.4 12/14/2018   Urine Drug Screen: No results found for: LABOPIA, COCAINSCRNUR, LABBENZ, AMPHETMU, THCU, LABBARB  Alcohol Level No results found for: ETH  CXR: Stable elevated left hemidiaphragm. Left basilar atelectasis with small left pleural effusion.  Impression   ALVETTA HIDROGO is a 70 y.o. female with PMH significant for hx of Myasthenia on Mestinon and Methotrexate who presents with symptoms some of which could be explained by a potential Pneumonia including cough and SOB, but in addition also has been leaning on her left and has weak neck flexion/extension and difficulty swallowing. Her neuro exam is notable for diffuse weakness with weak neck flexion and extension despite adequate attempt. Somewhat difficult to assess for bulbar symptoms with the Bipap in place. I do have suspicions for Myasthenia exacerbation. She was seen here in Sept 2021 and treated with IVIG with improvement. Will do IVIG here and continue to monitor exam.  Recommendations  - I  ordered IVIG 0.4g/Kg every 24 hours x 5 doses - I ordered Q6 hours NIFs and VC - NPO until swallow eval - Recommend elective intubation for respiratory compromise if VC falls below 15 to 20 mL/kg and or NIFs falls below -20cm/H2O. If poor effort and not sure, can always get ABG to assess for CO2 retention. Elective intubation for airway  cmopromise if she has difficulty clearing her secretions. Oxygen saturation should not be used to make decision regarding intubation. - Recommend Mestinon PO 60mg  TID. If unable to swallow, can switch from PO to IV.(30mg  PO is equivalent to 1mg  IV). - Hold off on Steroids for now. Steroids will acutely worsen Myasthenia before eventually resulting in improvement. - Would prefer to continue Methotrexate but if her Pneumonia continues to worsen, we can discontinue it and resume later. - In addition to the infectious workup ordered by the team, I ordered UA with reflex to culture as she reports some dysuria. - Medications that may worsen or trigger MG exacerbation: Class IA antiarrhythmics, magnesium, flouroquinolones, macrolides, aminoglycosides, penicillamine, curare, interferon alpha, botox, quinine. Use with caution: calcium channel blocker, beta blockers and statins.  _______________________________________________________________   Thank you for the opportunity to take part in the care of this patient. If you have any further questions, please contact the neurology consultation attending.  Signed,  Emmet Pager Number [phone] _ _ _   _ __   _ __ _ _  __ __   _ __   __ _

## 2020-04-06 NOTE — ED Triage Notes (Signed)
Pt to ED via POV for shortness of breath and trouble swallowing. Pt has hx/o Myasthenia Gravis. Pt states that pt does not normally have problems eating. Pt sister states that all of her symptoms started yesterday.

## 2020-04-06 NOTE — Telephone Encounter (Signed)
Can you triage this please?

## 2020-04-06 NOTE — Telephone Encounter (Signed)
Patient DPR says its coming from her Myasthenia gravis, she does not want to take her to the ER she says he needs a prednisone taper, she says if she takes her to ER they will just put her on oxygen and let her sit there. Patient just been coughing several days ago , her sister says she is using stomach to chest breathing, worse with exertion. Trying to get her to take patient to have her evaluated she refuses says he just trying to keep her calm and she is doing things for her. DPR says she slept well with CPAP,  but eating solid food is tiring for her so drinking protein shake for breakfast today. Patient is up an mobile, going to restroom. Sister took 46 sats 92% pulse rate at 110 per sister pulse I advised she neds to be seen she staed she could bring her here but not to ER.

## 2020-04-06 NOTE — ED Notes (Signed)
Patient transported to CT 

## 2020-04-06 NOTE — Consult Note (Signed)
PHARMACY -  BRIEF ANTIBIOTIC NOTE   Pharmacy has received consult(s) for cefepime and vancomycin from an ED provider.  The patient's profile has been reviewed for ht/wt/allergies/indication/available labs.    One time order(s) placed for cefepime 2 g IV and vancomycin 2 g IV   Further antibiotics/pharmacy consults should be ordered by admitting physician if indicated.                       Thank you, Darnelle Bos, PharmD  04/06/2020  1:48 PM

## 2020-04-06 NOTE — Addendum Note (Signed)
Addended by: [name] on: 04/06/2020 12:16 PM   Modules accepted: Orders

## 2020-04-06 NOTE — Telephone Encounter (Signed)
I still want the patient to speak to PCP, but in the meantime I like to give her prednisone for a short rescue dose.  Please inform sister that a patient on MTX is already immune supressed and therefor not automatically also on prednisone.  She may do best with PCP evaluation for inhaler if this is a cold.  I wrote for prednisone 10 mg po in AM and at lunch for 5 days only.

## 2020-04-07 ENCOUNTER — Inpatient Hospital Stay: Payer: PPO

## 2020-04-07 DIAGNOSIS — R0602 Shortness of breath: Secondary | ICD-10-CM | POA: Diagnosis present

## 2020-04-07 DIAGNOSIS — E871 Hypo-osmolality and hyponatremia: Secondary | ICD-10-CM | POA: Diagnosis present

## 2020-04-07 LAB — OSMOLALITY: Osmolality: 273 mOsm/kg — ABNORMAL LOW (ref 275–295)

## 2020-04-07 LAB — CBC WITH DIFFERENTIAL/PLATELET
Abs Immature Granulocytes: 0.07 10*3/uL (ref 0.00–0.07)
Basophils Absolute: 0 10*3/uL (ref 0.0–0.1)
Basophils Relative: 0 %
Eosinophils Absolute: 0.2 10*3/uL (ref 0.0–0.5)
Eosinophils Relative: 1 %
HCT: 31.9 % — ABNORMAL LOW (ref 36.0–46.0)
Hemoglobin: 10.5 g/dL — ABNORMAL LOW (ref 12.0–15.0)
Immature Granulocytes: 1 %
Lymphocytes Relative: 5 %
Lymphs Abs: 0.7 10*3/uL (ref 0.7–4.0)
MCH: 30.7 pg (ref 26.0–34.0)
MCHC: 32.9 g/dL (ref 30.0–36.0)
MCV: 93.3 fL (ref 80.0–100.0)
Monocytes Absolute: 1.7 10*3/uL — ABNORMAL HIGH (ref 0.1–1.0)
Monocytes Relative: 13 %
Neutro Abs: 10.1 10*3/uL — ABNORMAL HIGH (ref 1.7–7.7)
Neutrophils Relative %: 80 %
Platelets: 212 10*3/uL (ref 150–400)
RBC: 3.42 MIL/uL — ABNORMAL LOW (ref 3.87–5.11)
RDW: 15.4 % (ref 11.5–15.5)
WBC: 12.7 10*3/uL — ABNORMAL HIGH (ref 4.0–10.5)
nRBC: 0 % (ref 0.0–0.2)

## 2020-04-07 LAB — BLOOD GAS, VENOUS
Acid-Base Excess: 0.1 mmol/L (ref 0.0–2.0)
Bicarbonate: 24.6 mmol/L (ref 20.0–28.0)
FIO2: 0.5
O2 Saturation: 93.9 %
Patient temperature: 37
pCO2, Ven: 38 mmHg — ABNORMAL LOW (ref 44.0–60.0)
pH, Ven: 7.42 (ref 7.250–7.430)
pO2, Ven: 69 mmHg — ABNORMAL HIGH (ref 32.0–45.0)

## 2020-04-07 LAB — COMPREHENSIVE METABOLIC PANEL
ALT: 24 U/L (ref 0–44)
AST: 20 U/L (ref 15–41)
Albumin: 2.7 g/dL — ABNORMAL LOW (ref 3.5–5.0)
Alkaline Phosphatase: 58 U/L (ref 38–126)
Anion gap: 10 (ref 5–15)
BUN: 20 mg/dL (ref 8–23)
CO2: 23 mmol/L (ref 22–32)
Calcium: 8.5 mg/dL — ABNORMAL LOW (ref 8.9–10.3)
Chloride: 92 mmol/L — ABNORMAL LOW (ref 98–111)
Creatinine, Ser: 0.78 mg/dL (ref 0.44–1.00)
GFR, Estimated: >60 mL/min (ref >60)
Glucose, Bld: 135 mg/dL — ABNORMAL HIGH (ref 70–99)
Potassium: 4.1 mmol/L (ref 3.5–5.1)
Sodium: 125 mmol/L — ABNORMAL LOW (ref 135–145)
Total Bilirubin: 0.9 mg/dL (ref 0.3–1.2)
Total Protein: 6.1 g/dL — ABNORMAL LOW (ref 6.5–8.1)

## 2020-04-07 LAB — PHOSPHORUS: Phosphorus: 3.7 mg/dL (ref 2.5–4.6)

## 2020-04-07 LAB — TSH: TSH: 4.334 u[IU]/mL (ref 0.350–4.500)

## 2020-04-07 LAB — PROCALCITONIN: Procalcitonin: 0.16 ng/mL

## 2020-04-07 LAB — LACTIC ACID, PLASMA
Lactic Acid, Venous: 1.7 mmol/L (ref 0.5–1.9)
Lactic Acid, Venous: 1.7 mmol/L (ref 0.5–1.9)

## 2020-04-07 LAB — STREP PNEUMONIAE URINARY ANTIGEN: Strep Pneumo Urinary Antigen: NEGATIVE

## 2020-04-07 MED ORDER — SODIUM CHLORIDE 0.9 % IV SOLN: INTRAVENOUS | Status: DC

## 2020-04-07 MED ORDER — VANCOMYCIN HCL 1000 MG/200ML IV SOLN
1000.0000 mg | INTRAVENOUS | Status: DC
  Administered 2020-04-07 – 2020-04-08 (×2): 1000 mg via INTRAVENOUS
  Filled 2020-04-07 (×3): qty 200

## 2020-04-07 MED ORDER — SODIUM CHLORIDE 0.9 % IV SOLN
2.0000 g | Freq: Three times a day (TID) | INTRAVENOUS | Status: AC
  Administered 2020-04-07 – 2020-04-11 (×14): 2 g via INTRAVENOUS
  Filled 2020-04-07 (×16): qty 2

## 2020-04-07 NOTE — Progress Notes (Signed)
NIF -25 cm.  Pt has difficulty in performing the maneuver.

## 2020-04-07 NOTE — Progress Notes (Signed)
Pt. Unable to perform NIF and VC at this time.

## 2020-04-07 NOTE — ED Notes (Signed)
Pt awake and alert; nonverbal-- no acute distress on BiPaP 11/21/33% FiO2 - O2 sats stable @95 %.  Continuous pulse ox and cardiac monitoring maintained -- Vitals stable.  Sister remains at bedside as pt awaits bed assignment -- will monitor for acute changes and maintain plan of care.

## 2020-04-07 NOTE — Progress Notes (Signed)
[facility] NAME: Madison Burns    MR#:  [id number]  DATE OF BIRTH:  05-Feb-1951  SUBJECTIVE:  CHIEF COMPLAINT:   Chief Complaint  Patient presents with  . Shortness of Breath  . Dysphagia  Currently on BiPAP, feels short of breath.  Sister at bedside REVIEW OF SYSTEMS:  Review of Systems  Constitutional: Negative for diaphoresis, fever, malaise/fatigue and weight loss.  HENT: Negative for ear discharge, ear pain, hearing loss, nosebleeds, sore throat and tinnitus.   Eyes: Negative for blurred vision and pain.  Respiratory: Positive for shortness of breath. Negative for cough, hemoptysis and wheezing.   Cardiovascular: Negative for chest pain, palpitations, orthopnea and leg swelling.  Gastrointestinal: Positive for constipation. Negative for abdominal pain, blood in stool, diarrhea, heartburn, nausea and vomiting.  Genitourinary: Negative for dysuria, frequency and urgency.  Musculoskeletal: Negative for back pain and myalgias.  Skin: Negative for itching and rash.  Neurological: Negative for dizziness, tingling, tremors, focal weakness, seizures, weakness and headaches.  Psychiatric/Behavioral: Negative for depression. The patient is not nervous/anxious.    DRUG ALLERGIES:  No Known Allergies VITALS:  Blood pressure 117/81, pulse 95, temperature 98.5 F (36.9 C), temperature source Oral, resp. rate 16, SpO2 98 %. PHYSICAL EXAMINATION:  Physical Exam Constitutional:      General: She is in acute distress.     Appearance: She is ill-appearing.  HENT:     Head: Normocephalic and atraumatic.  Eyes:     Extraocular Movements: EOM normal.     Conjunctiva/sclera: Conjunctivae normal.     Pupils: Pupils are equal, round, and reactive to light.  Neck:     Thyroid: No thyromegaly.     Trachea: No tracheal deviation.  Cardiovascular:     Rate and Rhythm: Normal rate and regular rhythm.     Heart sounds: Normal heart sounds.   Pulmonary:     Effort: Pulmonary effort is normal. No respiratory distress.     Breath sounds: Examination of the right-lower field reveals decreased breath sounds and rhonchi. Examination of the left-lower field reveals decreased breath sounds and rhonchi. Decreased breath sounds and rhonchi present. No wheezing.  Chest:     Chest wall: No tenderness.  Abdominal:     General: Bowel sounds are normal. There is distension.     Palpations: Abdomen is soft.     Tenderness: There is no abdominal tenderness.  Musculoskeletal:        General: Normal range of motion.     Cervical back: Normal range of motion and neck supple.  Skin:    General: Skin is warm and dry.     Findings: No rash.  Neurological:     Mental Status: She is alert and oriented to person, place, and time.     Cranial Nerves: No cranial nerve deficit.    LABORATORY PANEL:  Female CBC Recent Labs  Lab 04/07/20 0535  WBC 12.7*  HGB 10.5*  HCT 31.9*  PLT 212   ------------------------------------------------------------------------------------------------------------------ Chemistries  Recent Labs  Lab 04/07/20 0535  NA 125*  K 4.1  CL 92*  CO2 23  GLUCOSE 135*  BUN 20  CREATININE 0.78  CALCIUM 8.5*  AST 20  ALT 24  ALKPHOS 58  BILITOT 0.9   RADIOLOGY:  US Venous Img Lower Bilateral (DVT)  Result Date: 04/07/2020 CLINICAL DATA:  Swelling. EXAM: BILATERAL LOWER EXTREMITY VENOUS DOPPLER ULTRASOUND TECHNIQUE: Gray-scale sonography with compression, as well as  color and duplex ultrasound, were performed to evaluate the deep venous system(s) from the level of the common femoral vein through the popliteal and proximal calf veins. COMPARISON:  None. FINDINGS: VENOUS Normal compressibility of the common femoral, superficial femoral, and popliteal veins, as well as the visualized calf veins. Visualized portions of profunda femoral vein and great saphenous vein unremarkable. No filling defects to suggest DVT on  grayscale or color Doppler imaging. Doppler waveforms show normal direction of venous flow, normal respiratory plasticity and response to augmentation. Limited views of the contralateral common femoral vein are unremarkable. OTHER None. Limitations: none IMPRESSION: Negative. Electronically Signed   By: Dorise Bullion III M.D   On: 04/07/2020 09:51   ASSESSMENT AND PLAN:  70 y.o. female with medical history significant for myasthenia gravis, cognitive impairment, acquired hypothyroidism, obstructive sleep apnea compliant on home nocturnal CPAP, hypertension, hyperlipidemia admitted for sepsis due to left basilar pneumonia after presenting from home to [facility] ED complaining of shortness of breath.   Sepsis ruled out -normal lactic acid, will repeat lactate  Acute hypoxic respiratory failure present on admission and requiring BiPAP thought to be due to Community-acquired pneumonia We will check procalcitonin to evaluate need for antibiotics, this could be just atelectasis and acute bronchitis Continue vancomycin and cefepime for now Wean her off BiPAP to nasal cannula oxygen  Myasthenia gravis exacerbation Continue IVIG  Neuro consult every 6 hours NIF and vital capacity checks, Mestinon 60 mg p.o. 3 times daily   Hyponatremia Sodium 138-> 126-> 125 Start gentle hydration and monitor sodium  Hypothyroidism Normal TSH, continue Synthroid 75 mcg once daily  Lower extremity pain  DVT ruled out by venous Dopplers There is no height or weight on file to calculate BMI.  Net IO Since Admission: -660 mL [04/07/20 1535]     Status is: Inpatient  Remains inpatient appropriate because:IV treatments appropriate due to intensity of illness or inability to take PO   Dispo: The patient is from: Home              Anticipated d/c is to: Home              Anticipated d/c date is: 2 days              Patient currently is not medically stable to d/c.   Difficult to place patient No   DVT  prophylaxis:       SCDs Start: 04/06/20 1635     Family Communication: Sister updated at bedside on 2/19   All the records are reviewed and case discussed with Care Management/Social Worker. Management plans discussed with the patient, family/sister and they are in agreement.  CODE STATUS: Partial Code Level of care: Med-Surg  TOTAL TIME TAKING CARE OF THIS PATIENT: 35 minutes.   More than 50% of the time was spent in counseling/coordination of care: YES  POSSIBLE D/C IN 2-3 DAYS, DEPENDING ON CLINICAL CONDITION.   Max Sane M.D on 04/07/2020 at 3:35 PM  [facility]   CC: Primary care physician; Einar Pheasant, MD  Note: This dictation was prepared with Dragon dictation along with smaller phrase technology. Any transcriptional errors that result from this process are unintentional.

## 2020-04-07 NOTE — Consult Note (Signed)
Pharmacy Antibiotic Note  Madison Burns is a 70 y.o. female admitted on 04/06/2020 with pneumonia. PMH significant for GERD, hx of seizure, HLD, HTN, Hypothyroidism, Myasthenia gravis on Methotrexate who presents with increasing SOB over the last 2 days with cough + weakness and difficulty swallowing for a day. Pharmacy has been consulted for cefepime and vancomycin dosing.  Plan: Change Cefepime 2 g IV q12h to Cefepime 2g q8h Vancomycin 2 g IV x 1 in the ED, followed by 1 g IV q24h.  Goal AUC 400-550 Estimated AUC: 441 (Scr: 0.80 and Vd: 0.5) Monitor clinical picture and renal function F/U C&S, abx deescalation / LOT  MRSA PCR ordered; f/u and narrow vanc as appropriate - followed up with ED - still not swabbed - contacted lab to send a swab - still not pending   No data recorded.  Recent Labs  Lab 04/06/20 1124 04/06/20 1350 04/07/20 0535  WBC 17.3*  --  12.7*  CREATININE 0.99  --  0.78  LATICACIDVEN  --  1.5  --     Estimated Creatinine Clearance: 71.5 mL/min (by C-G formula based on SCr of 0.78 mg/dL).    No Known Allergies  Antimicrobials this admission: 2/18 cefepime >> 2/18 vancomycin >>   Dose adjustments this admission:  Microbiology results: 2/18 BCx: pending 2/18 MRSA PCR: pending  Thank you for allowing pharmacy to be a part of this patient's care.  Lu Duffel, PharmD, BCPS Clinical Pharmacist 04/07/2020 1:19 PM

## 2020-04-07 NOTE — ED Notes (Signed)
Pt now talking -- reports spontaneous pain to R ankle -- BLE nonpitting edema noted with ankle socks that appear too tight - this nurse has removed socks and will elevate legs as well as administer Tylenol if pt agreeable -- no other acute changes noted.

## 2020-04-07 NOTE — ED Notes (Signed)
Pt sister now reporting pt c/o L foot pain -- earlier in the a.m. pt c/o R ankle pain relieved with PO Tylenol.  Sister concerned for blood clots -- advised will updated on-call provider to pass on to dayshift doctors to eval today.  No calf pain -- edema noted however appears to be dependent edema which has improved since elevated LE on pillows.  No other concerns verbalized at this time by patient or sister.

## 2020-04-07 NOTE — ED Notes (Signed)
Pt taken off bipap and placed on 6L [place]- pt sats remain around 97%- pt placed on a bedside commode to have a bm- small wound noted on pt left buttocks where sister states she had a biopsy

## 2020-04-07 NOTE — ED Notes (Signed)
Pt remains on bedside commode and not ready to get back in bed yet

## 2020-04-07 NOTE — ED Notes (Addendum)
Pt off bedside commode- pt unable to have bm- pt wound on left buttocks covered with a foam dressing- pt back in bed and comfortable- pt able to eat lunch

## 2020-04-07 NOTE — ED Notes (Signed)
Ultrasound at bedside. Neurologist at bedside. Sister requesting for pt to eat. Explained that pt on Bipap and that we are very cautious giving food while pts on Bipap because of risk of aspiration. Told sister we can try a few bites of applesauce. PRN tylenol provided per pt/sister request for leg pain.

## 2020-04-07 NOTE — ED Notes (Signed)
Pt turned down to 4L [place]- pt O2 sats remain at 95%

## 2020-04-07 NOTE — Progress Notes (Incomplete)
NEUROLOGY CONSULTATION PROGRESS NOTE   Date of service: April 07, 2020 Patient Name: Madison Burns MRN:  [id number] DOB:  01/20/1951  Brief HPI  Madison Burns is a 70 y.o. female with hx of Myasthenia on Mestinon and Methotrexate who presents with sepsis due to left basilar pneumonia, hyponatremia and myasthenia exacerbation. Interval Hx     Vitals   Vitals:   04/07/20 0800 04/07/20 0830 04/07/20 0900 04/07/20 0930  BP: 105/72 115/76 104/70 117/65  Pulse: 89 96 70 96  Resp: 17 (!) 21 18 (!) 22  Temp:      TempSrc:      SpO2: 98% 96% 95% 96%     There is no height or weight on file to calculate BMI.  Physical Exam   General: Laying comfortably in bed; in no acute distress. *** HENT: Normal oropharynx and mucosa. Normal external appearance of ears and nose. *** Neck: Supple, no pain or tenderness *** CV: No JVD. No peripheral edema. *** Pulmonary: Symmetric Chest rise. Normal respiratory effort. *** Abdomen: Soft to touch, non-tender. *** Ext: No cyanosis, edema, or deformity *** Skin: No rash. Normal palpation of skin.  *** Musculoskeletal: Normal digits and nails by inspection. No clubbing. ***  Neurologic Examination  Mental status/Cognition: Alert, oriented to self, place, month and year, good attention. *** Speech/language: Fluent, comprehension intact, object naming intact, repetition intact. *** Cranial nerves:   CN II Pupils equal and reactive to light, no VF deficits ***   CN III,IV,VI EOM intact, no gaze preference or deviation, no nystagmus ***   CN V normal sensation in V1, V2, and V3 segments bilaterally ***   CN VII no asymmetry, no nasolabial fold flattening ***   CN VIII normal hearing to speech ***   CN IX & X normal palatal elevation, no uvular deviation ***   CN XI 5/5 head turn and 5/5 shoulder shrug bilaterally ***   CN XII midline tongue protrusion ***   Motor:  Muscle bulk: ***, tone ***, pronator drift *** tremor *** Mvmt Root Nerve   Muscle Right Left Comments  SA C5/6 Ax Deltoid     EF C5/6 Mc Biceps     EE C6/7/8 Rad Triceps     WF C6/7 Med FCR     WE C7/8 PIN ECU     F Ab C8/T1 U ADM/FDI     HF L1/2/3 Fem Illopsoas     KE L2/3/4 Fem Quad     DF L4/5 D Peron Tib Ant     PF S1/2 Tibial Grc/Sol      Reflexes:  Right Left Comments  Pectoralis      Biceps (C5/6)     Brachioradialis (C5/6)      Triceps (C6/7)      Patellar (L3/4)      Achilles (S1)      Hoffman      Plantar     Jaw jerk    Sensation:  Light touch    Pin prick    Temperature    Vibration   Proprioception    Coordination/Complex Motor:  - Finger to Nose *** - Heel to shin *** - Rapid alternating movement *** - Gait: Stride length ***. Arm swing ***. Base width ***  Labs   Basic Metabolic Panel:  Lab Results  Component Value Date   NA 125 (L) 04/07/2020   K 4.1 04/07/2020   CO2 23 04/07/2020   GLUCOSE 135 (H) 04/07/2020   BUN 20 04/07/2020  CREATININE 0.78 04/07/2020   CALCIUM 8.5 (L) 04/07/2020   GFRNONAA >60 04/07/2020   GFRAA >60 11/04/2019   HbA1c:  Lab Results  Component Value Date   HGBA1C 5.4 12/14/2018   LDL:  Lab Results  Component Value Date   LDLCALC 110 (H) 08/06/2018   Urine Drug Screen: No results found for: LABOPIA, COCAINSCRNUR, LABBENZ, AMPHETMU, THCU, LABBARB  Alcohol Level No results found for: ETH No results found for: PHENYTOIN, ZONISAMIDE, LAMOTRIGINE, LEVETIRACETA No results found for: PHENYTOIN, PHENOBARB, VALPROATE, CBMZ  Imaging and Diagnostic studies  Results for orders placed during the hospital encounter of 10/22/19  ECHOCARDIOGRAM COMPLETE  Narrative ECHOCARDIOGRAM REPORT    Patient Name:   CHIDERA DEARCOS Fontes Date of Exam: 10/24/2019 Medical Rec #:  [id number]       Height:       62.0 in Accession #:    [id number]      Weight:       204.0 lb Date of Birth:  April 02, 1950       BSA:          1.928 m Patient Age:    69 years        BP:           132/84 mmHg Patient Gender: F                HR:           128 bpm. Exam Location:  [facility]  Procedure: 2D Echo, Color Doppler and Cardiac Doppler  Indications:     I48.91 Atrial Fibrillation  History:         Patient has no prior history of Echocardiogram examinations. Risk Factors:Sleep Apnea, Hypertension and HCL.  Sonographer:     Charmayne Sheer RDCS (AE) Referring Phys:  [id number] Collier Bullock Diagnosing Phys: Serafina Royals MD   Sonographer Comments: Suboptimal parasternal window and no subcostal window. Image acquisition challenging due to patient body habitus, Image acquisition challenging due to respiratory motion and tachycardia. IMPRESSIONS   1. Left ventricular ejection fraction, by estimation, is 70 to 75%. The left ventricle has hyperdynamic function. The left ventricle has no regional wall motion abnormalities. Left ventricular diastolic parameters were normal. 2. Right ventricular systolic function is normal. The right ventricular size is normal. There is normal pulmonary artery systolic pressure. 3. The mitral valve is normal in structure. Trivial mitral valve regurgitation. 4. The aortic valve is normal in structure. Aortic valve regurgitation is not visualized.  FINDINGS Left Ventricle: Left ventricular ejection fraction, by estimation, is 70 to 75%. The left ventricle has hyperdynamic function. The left ventricle has no regional wall motion abnormalities. The left ventricular internal cavity size was small. There is no left ventricular hypertrophy. Left ventricular diastolic parameters were normal.  Right Ventricle: The right ventricular size is normal. No increase in right ventricular wall thickness. Right ventricular systolic function is normal. There is normal pulmonary artery systolic pressure. The tricuspid regurgitant velocity is 2.27 m/s, and with an assumed right atrial pressure of 10 mmHg, the estimated right ventricular systolic pressure is 17.4 mmHg.  Left Atrium: Left atrial size was normal in  size.  Right Atrium: Right atrial size was normal in size.  Pericardium: There is no evidence of pericardial effusion.  Mitral Valve: The mitral valve is normal in structure. Trivial mitral valve regurgitation. MV peak gradient, 3.6 mmHg. The mean mitral valve gradient is 2.0 mmHg.  Tricuspid Valve: The tricuspid valve is normal in structure. Tricuspid valve regurgitation is trivial.  Aortic Valve: The aortic valve is normal in structure. Aortic valve regurgitation is not visualized. Aortic valve mean gradient measures 3.0 mmHg. Aortic valve peak gradient measures 5.5 mmHg. Aortic valve area, by VTI measures 3.31 cm.  Pulmonic Valve: The pulmonic valve was normal in structure. Pulmonic valve regurgitation is not visualized.  Aorta: The aortic root and ascending aorta are structurally normal, with no evidence of dilitation.  IAS/Shunts: No atrial level shunt detected by color flow Doppler.   LEFT VENTRICLE PLAX 2D LVIDd:         3.83 cm  Diastology LVIDs:         2.31 cm  LV e' lateral:   7.94 cm/s LV PW:         1.06 cm  LV E/e' lateral: 7.1 LV IVS:        0.86 cm  LV e' medial:    7.18 cm/s LVOT diam:     2.20 cm  LV E/e' medial:  7.8 LV SV:         48 LV SV Index:   25 LVOT Area:     3.80 cm   LEFT ATRIUM             Index Burns diam:        2.90 cm 1.50 cm/m Burns Vol (A2C):   37.5 ml 19.45 ml/m Burns Vol (A4C):   39.8 ml 20.64 ml/m Burns Biplane Vol: 42.5 ml 22.05 ml/m AORTIC VALVE                   PULMONIC VALVE AV Area (Vmax):    2.62 cm    PV Vmax:       1.15 m/s AV Area (Vmean):   3.08 cm    PV Vmean:      65.500 cm/s AV Area (VTI):     3.31 cm    PV VTI:        0.155 m AV Vmax:           117.00 cm/s PV Peak grad:  5.3 mmHg AV Vmean:          76.200 cm/s PV Mean grad:  2.0 mmHg AV VTI:            0.146 m AV Peak Grad:      5.5 mmHg AV Mean Grad:      3.0 mmHg LVOT Vmax:         80.50 cm/s LVOT Vmean:        61.800 cm/s LVOT VTI:          0.127 m LVOT/AV VTI  ratio: 0.87  AORTA Ao Root diam: 3.60 cm  MITRAL VALVE               TRICUSPID VALVE MV Area (PHT): 5.66 cm    TR Peak grad:   20.6 mmHg MV Peak grad:  3.6 mmHg    TR Vmax:        227.00 cm/s MV Mean grad:  2.0 mmHg MV Vmax:       0.95 m/s    SHUNTS MV Vmean:      63.9 cm/s   Systemic VTI:  0.13 m MV Decel Time: 134 msec    Systemic Diam: 2.20 cm MV E velocity: 56.30 cm/s MV A velocity: 70.30 cm/s MV E/A ratio:  0.80  Serafina Royals MD Electronically signed by Serafina Royals MD Signature Date/Time: 10/25/2019/1:12:06 PM    Final   Impression   Madison Burns is  a 70 y.o. female with PMH significant for ***. Her neurologic examination is notable for ***.  Recommendations  *** ______________________________________________________________________   Thank you for the opportunity to take part in the care of this patient. If you have any further questions, please contact the neurology consultation attending.  Signed,  [place] Pager Number [phone]

## 2020-04-08 ENCOUNTER — Inpatient Hospital Stay: Payer: PPO

## 2020-04-08 DIAGNOSIS — G7000 Myasthenia gravis without (acute) exacerbation: Secondary | ICD-10-CM

## 2020-04-08 LAB — BASIC METABOLIC PANEL
Anion gap: 8 (ref 5–15)
BUN: 21 mg/dL (ref 8–23)
CO2: 22 mmol/L (ref 22–32)
Calcium: 8.4 mg/dL — ABNORMAL LOW (ref 8.9–10.3)
Chloride: 94 mmol/L — ABNORMAL LOW (ref 98–111)
Creatinine, Ser: 0.81 mg/dL (ref 0.44–1.00)
GFR, Estimated: >60 mL/min (ref >60)
Glucose, Bld: 143 mg/dL — ABNORMAL HIGH (ref 70–99)
Potassium: 4.1 mmol/L (ref 3.5–5.1)
Sodium: 124 mmol/L — ABNORMAL LOW (ref 135–145)

## 2020-04-08 LAB — CBC
HCT: 32.8 % — ABNORMAL LOW (ref 36.0–46.0)
Hemoglobin: 10.8 g/dL — ABNORMAL LOW (ref 12.0–15.0)
MCH: 30.3 pg (ref 26.0–34.0)
MCHC: 32.9 g/dL (ref 30.0–36.0)
MCV: 91.9 fL (ref 80.0–100.0)
Platelets: 223 10*3/uL (ref 150–400)
RBC: 3.57 MIL/uL — ABNORMAL LOW (ref 3.87–5.11)
RDW: 15 % (ref 11.5–15.5)
WBC: 11.9 10*3/uL — ABNORMAL HIGH (ref 4.0–10.5)
nRBC: 0 % (ref 0.0–0.2)

## 2020-04-08 LAB — MRSA PCR SCREENING: MRSA by PCR: NEGATIVE

## 2020-04-08 LAB — PROCALCITONIN: Procalcitonin: 0.1 ng/mL

## 2020-04-08 MED ORDER — POLYETHYLENE GLYCOL 3350 17 G PO PACK
17.0000 g | PACK | Freq: Every day | ORAL | Status: DC
  Administered 2020-04-09 – 2020-04-10 (×2): 17 g via ORAL
  Filled 2020-04-08 (×3): qty 1

## 2020-04-08 MED ORDER — METHYLPREDNISOLONE SODIUM SUCC 125 MG IJ SOLR
60.0000 mg | INTRAMUSCULAR | Status: DC
  Administered 2020-04-08: 60 mg via INTRAVENOUS
  Filled 2020-04-08: qty 2

## 2020-04-08 MED ORDER — GUAIFENESIN-DM 100-10 MG/5ML PO SYRP: 5.0000 mL | ORAL_SOLUTION | ORAL | Status: DC | PRN

## 2020-04-08 MED ORDER — SENNOSIDES-DOCUSATE SODIUM 8.6-50 MG PO TABS
2.0000 | ORAL_TABLET | Freq: Two times a day (BID) | ORAL | Status: DC
  Administered 2020-04-08 – 2020-04-09 (×2): 2 via ORAL
  Filled 2020-04-08 (×5): qty 2

## 2020-04-08 MED ORDER — IPRATROPIUM-ALBUTEROL 0.5-2.5 (3) MG/3ML IN SOLN
3.0000 mL | Freq: Four times a day (QID) | RESPIRATORY_TRACT | Status: DC
  Administered 2020-04-08 (×2): 3 mL via RESPIRATORY_TRACT
  Filled 2020-04-08 (×2): qty 3

## 2020-04-08 MED ORDER — DILTIAZEM HCL ER COATED BEADS 180 MG PO CP24
180.0000 mg | ORAL_CAPSULE | Freq: Every day | ORAL | Status: DC
  Administered 2020-04-08: 180 mg via ORAL
  Filled 2020-04-08: qty 1

## 2020-04-08 NOTE — Progress Notes (Addendum)
NEUROLOGY CONSULTATION PROGRESS NOTE   Date of service: April 08, 2020 Patient Name: Madison Burns MRN:  [id number] DOB:  1950-06-18  Brief HPI  Madison Burns is a 70 y.o. female with hx of Myasthenia on Mestinon and Methotrexate who presents with sepsis due to left basilar pneumonia, hyponatremia and myasthenia exacerbation.   Interval Hx   Of Bipap now. Ate her breakfast but not all of it. Had an episode where she choked on her food but is doing good now. Sister reports that she was short of breath and wheezing when she walked to the bathroom and back to her bed. Not eating like she does at home but somewhat better.  Vitals   Vitals:   04/07/20 2350 04/08/20 0500 04/08/20 0810 04/08/20 1157  BP:  120/79 122/89 132/80  Pulse:  (!) 108 (!) 113 (!) 114  Resp:   16 18  Temp:  98.6 F (37 C) 97.8 F (36.6 C) 97.8 F (36.6 C)  TempSrc:  Oral    SpO2: 96% 98% 98% 97%  Weight:      Height:         Body mass index is 35.07 kg/m.  Physical Exam   General: Laying comfortably in bed; in no acute distress.  HENT: Normal oropharynx and mucosa. Normal external appearance of ears and nose.  Neck: Supple, no pain or tenderness  CV: No JVD. No peripheral edema.  Pulmonary: Symmetric Chest rise. Shallow breaths today. Abdomen: Soft to touch, non-tender.  Ext: No cyanosis, edema, or deformity  Skin: No rash. Normal palpation of skin.   Musculoskeletal: Normal digits and nails by inspection. No clubbing.   Neurologic Examination  Mental status/Cognition: Alert, oriented to self, place. Good attention. Speech/language: Fluent, comprehension intact, object naming intact, repetition intact.  Cranial nerves:   CN II Pupils equal and reactive to light, no VF deficits   CN III,IV,VI Looks left and right.   CN V    CN VII no asymmetry, no nasolabial fold flattening    CN VIII normal hearing to speech   CN IX & X normal palatal elevation, no uvular deviation   CN XI    CN XII  midline tongue protrusion   Motor:  Neck flexion 3/5 Neck extension: 3/5 Muscle bulk: normal, tone normal, pronator drift none tremor none Mvmt Root Nerve  Muscle Right Left Comments  SA C5/6 Ax Deltoid     EF C5/6 Mc Biceps 4+ 4+   EE C6/7/8 Rad Triceps 4+ 4+   WF C6/7 Med FCR     WE C7/8 PIN ECU     F Ab C8/T1 U ADM/FDI 3+ 3+   HF L1/2/3 Fem Illopsoas 4+ 4+   KE L2/3/4 Fem Quad     DF L4/5 D Peron Tib Ant 4 4   PF S1/2 Tibial Grc/Sol 4 4     Sensation:  Light touch Intact throughout   Pin prick    Temperature    Vibration   Proprioception    Coordination/Complex Motor:  - Finger to Nose intact BL - Heel to shin intact BL - Rapid alternating movement are slowed throughout - Gait: Deferred.  Labs   Basic Metabolic Panel:  Lab Results  Component Value Date   NA 124 (L) 04/08/2020   K 4.1 04/08/2020   CO2 22 04/08/2020   GLUCOSE 143 (H) 04/08/2020   BUN 21 04/08/2020   CREATININE 0.81 04/08/2020   CALCIUM 8.4 (L) 04/08/2020   GFRNONAA >60 04/08/2020  GFRAA >60 11/04/2019   HbA1c:  Lab Results  Component Value Date   HGBA1C 5.4 12/14/2018   LDL:  Lab Results  Component Value Date   LDLCALC 110 (H) 08/06/2018   Urine Drug Screen: No results found for: LABOPIA, COCAINSCRNUR, LABBENZ, AMPHETMU, THCU, LABBARB  Alcohol Level No results found for: ETH No results found for: PHENYTOIN, ZONISAMIDE, LAMOTRIGINE, LEVETIRACETA No results found for: PHENYTOIN, PHENOBARB, VALPROATE, CBMZ  Imaging and Diagnostic studies   Prior CT Chest with Contrast: 1. There is a lobulated, internally calcified mass of the anterior mediastinum which effaces the central portion of the left brachiocephalic vein and very closely abuts the tubular ascending aorta, measuring approximately 5.8 x 4.9 x 4.1 cm (series 2, image 58, series 6, image 80). 2. There appears to be adjacent pleural nodularity about the right upper lobe (series 2, image 39, series 5, image 55). 3. 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 Although patient has a presumed renal cell carcinoma better evaluated by prior MRI, metastatic disease is much less favored given this appearance. 4. There is a 3 mm pulmonary nodule of the right upper lobe (series 3, image 68). 3 mm pulmonary nodule of the anterior right middle lobe (series 3, image 91). These nodules are nonspecific and metastatic disease is not excluded. Attention on follow-up. 5. Hepatic steatosis. 6. Probable bilateral adrenal adenomas, better characterized by prior MRI.  CXR: Stable elevated left hemidiaphragm. Left basilar atelectasis with small left pleural effusion.   US Venous Doppler BL Lower ext: No DVTs.  Impression   Madison Burns is a 70 y.o. female with PMH significant for hx of Myasthenia on Mestinon and Methotrexate who presents with Pneumonia, hyponatremia and Myasthenia exacerbation. Her weakness today is a little worse compared to yesterday but also started on Steroids today too. Will need to monitor her closely.  Recommendations  - Continue IVIG 0.4g/Kg every 24 hours x 5 doses(last dose on 04/10/20) - Continue Q6 hoursNIFs and VC. - Recommend elective intubation for respiratory compromise ifVC falls below 15 to 20 mL/kgand or NIFs falls below -20cm/H2O.If poor effort and not sure, can always get ABG to assess for CO2 retention. Elective intubation for airway cmopromise if she has difficulty clearing her secretions. Oxygen saturation should not be used to make decision regarding intubation. -ContinueMestinonPO 60mg  TID. If unable to swallow, can switch fromPO to IV.(30mg  PO is equivalent to 1mg  IV). - Was started on IV Solumedrol 125mg  daily today, will need to monitor her exam. Steroids can worsen myasthenia for a couple days before improvement. - Would prefer to  continue Methotrexate but if her Pneumonia continues to worsen, we can discontinue it and resume later. - I ordered [facility] Ab, she was found to have a lung nodule on CT Chest recently. Important to assess for potenial Rita [place]. -Medications that may worsen or trigger MG exacerbation: Class IA antiarrhythmics, magnesium, flouroquinolones, macrolides, aminoglycosides, penicillamine, curare, interferon alpha, botox, quinine. Use with caution:calcium channel blocker,beta blockers andstatins. ______________________________________________________________________   Thank you for the opportunity to take part in the care of this patient. If you have any further questions, please contact the neurology consultation attending.  Signed,  [place] Pager Number [phone]

## 2020-04-08 NOTE — Consult Note (Signed)
[address] [place], [place] [address] Phone [phone]. Fax [phone]  Date: 04/08/2020                  Patient Name:  Madison Burns  MRN: [id number]  DOB: Sep 20, 1950  Age / Sex: 70 y.o., female         PCP: Einar Pheasant, MD                 Service Requesting Consult: IM/ Max Sane, MD                 Reason for Consult: hyponatremia            History of Present Illness: Patient is a 70 y.o. female  was admitted to [facility] on 04/06/2020   Patient presented to the emergency room for shortness of breath and trouble swallowing.  She has medical problems of obstructive sleep apnea, seizure, GERD, hyperlipidemia, hypertension, hypothyroidism and myasthenia gravis and takes methotrexate and Mestinon for it.  Diagnosed with left lower lobe pneumonia.  Getting IV broad-spectrum antibiotics.  Medications: Outpatient medications: Medications Prior to Admission  Medication Sig Dispense Refill Last Dose  . Ascorbic Acid (VITAMIN C) 1000 MG tablet Take 1,000 mg by mouth at bedtime.   04/05/2020 at 1430  . B Complex-C (B-COMPLEX WITH VITAMIN C) tablet Take 1 tablet by mouth every evening.   04/05/2020 at 2000  . Biotin 10000 MCG TABS Take 10,000 mcg by mouth daily at 2 PM. (1430)   04/05/2020 at 1430  . Calcium-Phosphorus-Vitamin D (CITRACAL +D3 PO) Take 1 tablet by mouth daily.   04/05/2020 at 2000  . diltiazem (CARDIZEM CD) 180 MG 24 hr capsule Take 1 capsule (180 mg total) by mouth daily. 30 capsule 2 04/06/2020 at 0830  . DULoxetine (CYMBALTA) 30 MG capsule TAKE 1 CAPSULE BY MOUTH ONCE DAILY (Patient taking differently: Take 30 mg by mouth daily.) 30 capsule 5 04/06/2020 at 0830  . folic acid (FOLVITE) 1 MG tablet 2 mg every day po 60 tablet 5 04/06/2020 at 0830  . Garlic 4970 MG CAPS Take 1,000 mg by mouth in the morning, at noon, and at bedtime. (0800, 1430, 2000)   04/06/2020 at 0830  . levothyroxine (SYNTHROID) 75 MCG tablet TAKE 1 TABLET BY MOUTH DAILY ON AN  EMPTYSTOMACH. WAIT 30 MINUTES BEFORE TAKING OTHER MEDS (Patient taking differently: Take 75 mcg by mouth daily before breakfast.) 90 tablet 1 04/06/2020 at 0830  . pantoprazole (PROTONIX) 40 MG tablet Take 40 mg by mouth daily.    04/06/2020 at 0830  . Probiotic Product (PROBIOTIC MULTI-ENZYME PO) Take 1 capsule by mouth in the morning and at bedtime. Probiotic Multi-Enzyme Digestive Formula   Past Month at Unknown time  . pyridostigmine (MESTINON) 60 MG tablet TAKE 1 TABLET BY MOUTH AT  8 AM, ONE AT LUNCH AND ONE AT 8PM (Patient taking differently: Take 60 mg by mouth 3 (three) times daily. TAKE 1 TABLET BY MOUTH AT  8 AM, ONE AT LUNCH AND ONE AT 8PM) 90 tablet 6 04/06/2020 at 0830  . risperiDONE (RISPERDAL) 1 MG tablet Take 1 mg by mouth at bedtime.    04/05/2020 at 2000  . sucralfate (CARAFATE) 1 g tablet Take 1 g by mouth 2 (two) times daily. (0800 & 1700)   04/06/2020 at 0830  . tolterodine (DETROL LA) 4 MG 24 hr capsule TAKE 1 CAPSULE BY MOUTH ONCE EVERY MORNING (Patient taking differently: Take 4 mg by mouth in the morning.) 90  capsule 1 04/06/2020 at 0830  . vitamin E 180 MG (400 UNITS) capsule Take 400 Units by mouth daily at 2 PM. 1430   04/05/2020 at 1430  . methotrexate 2.5 MG tablet TAKE 4 TABLETS BY MOUTH BY MOUTH ONCE A WEEK WITH FOLIC ACID (Patient taking differently: Take 10 mg by mouth once a week. TAKE 4 TABLETS BY MOUTH BY MOUTH ONCE A WEEK WITH FOLIC ACID) 16 tablet 5 03/31/2020 at 1800  . predniSONE (DELTASONE) 10 MG tablet Take one in AM and one at lunch for breathing relief (Patient taking differently: Take 10 mg by mouth 2 (two) times daily with a meal. Take one in AM and one at lunch for breathing relief) 12 tablet 3     Current medications: Current Facility-Administered Medications  Medication Dose Route Frequency Provider Last Rate Last Admin  . acetaminophen (TYLENOL) tablet 650 mg  650 mg Oral Q6H PRN Howerter, Justin B, DO   650 mg at 04/07/20 1917   Or  . acetaminophen  (TYLENOL) suppository 650 mg  650 mg Rectal Q6H PRN Howerter, Justin B, DO      . ceFEPIme (MAXIPIME) 2 g in sodium chloride 0.9 % 100 mL IVPB  2 g Intravenous Q8H Lu Duffel, RPH 200 mL/hr at 04/08/20 0263 2 g at 04/08/20 7858  . diltiazem (CARDIZEM CD) 24 hr capsule 180 mg  180 mg Oral Daily Manuella Ghazi, Vipul, MD   180 mg at 04/08/20 1002  . DULoxetine (CYMBALTA) DR capsule 30 mg  30 mg Oral Daily Howerter, Justin B, DO   30 mg at 04/08/20 0949  . Immune Globulin 10% (PRIVIGEN) IV infusion 25 g  400 mg/kg (Adjusted) Intravenous Q24 Hr x 5 Howerter, Justin B, DO   Stopped at 04/07/20 2252  . levothyroxine (SYNTHROID) tablet 75 mcg  75 mcg Oral QAC breakfast Howerter, Justin B, DO   75 mcg at 04/08/20 8502  . methotrexate (RHEUMATREX) tablet 10 mg  10 mg Oral Weekly Howerter, Justin B, DO   10 mg at 04/07/20 1919  . pantoprazole (PROTONIX) EC tablet 40 mg  40 mg Oral Daily Howerter, Justin B, DO   40 mg at 04/08/20 0949  . pyridostigmine (MESTINON) tablet 60 mg  60 mg Oral TID Howerter, Justin B, DO   60 mg at 04/08/20 0949  . risperiDONE (RISPERDAL) tablet 1 mg  1 mg Oral QHS Howerter, Justin B, DO   1 mg at 04/07/20 2047  . vancomycin (VANCOREADY) IVPB 1000 mg/200 mL  1,000 mg Intravenous Q24H Howerter, Justin B, DO 200 mL/hr at 04/07/20 1918 1,000 mg at 04/07/20 1918      Allergies: No Known Allergies    Past Medical History: Past Medical History:  Diagnosis Date  . Cancer of kidney ([place]) 2021  . Complication of anesthesia    Myasthenia gravis  . GERD (gastroesophageal reflux disease)   . History of seizure disorder   . Hypercholesterolemia   . Hypertension    no longer has it. wt loss  . Hypothyroidism   . Irritable bowel syndrome   . Myasthenia gravis ([place]) 2011  . OSA on CPAP   . Skin cancer 2017   Melanoma on neck  . Thymoma, malignant [facility])      Past Surgical History: Past Surgical History:  Procedure Laterality Date  . ABDOMINAL HYSTERECTOMY  1990  .  APPENDECTOMY  1999  . CHOLECYSTECTOMY  2013  . COLONOSCOPY WITH PROPOFOL N/A 06/30/2017   Procedure: COLONOSCOPY WITH PROPOFOL;  Surgeon:  Toledo, Benay Pike, MD;  Location: [facility] ENDOSCOPY;  Service: Gastroenterology;  Laterality: N/A;  . CYSTOSCOPY W/ URETERAL STENT PLACEMENT Left 08/17/2019   Procedure: CYSTOSCOPY WITH RETROGRADE PYELOGRAM/URETERAL STENT PLACEMENT;  Surgeon: Alexis Frock, MD;  Location: [facility];  Service: Urology;  Laterality: Left;  30 MINS  . ESOPHAGOGASTRODUODENOSCOPY (EGD) WITH PROPOFOL N/A 06/30/2017   Procedure: ESOPHAGOGASTRODUODENOSCOPY (EGD) WITH PROPOFOL;  Surgeon: Toledo, Benay Pike, MD;  Location: [facility] ENDOSCOPY;  Service: Gastroenterology;  Laterality: N/A;  . IR RADIOLOGIST EVAL & MGMT  06/28/2019  . IR RADIOLOGIST EVAL & MGMT  09/14/2019  . IR RADIOLOGIST EVAL & MGMT  03/28/2020  . RADIOFREQUENCY ABLATION Left 08/17/2019   Procedure: LEFT RENAL CRYO ABLATION;  Surgeon: Jacqulynn Cadet, MD;  Location: [facility];  Service: Anesthesiology;  Laterality: Left;  . TONSILLECTOMY  1959     Family History: Family History  Problem Relation Age of Onset  . Colon cancer Maternal Grandfather        stomach/colon  . Heart disease Father        myocardial infarction  . Hyperlipidemia Father   . Hyperlipidemia Sister   . Diabetes Sister   . Bone cancer Other        cousin  . Alzheimer's disease Mother        maternal aunts  . Skin cancer Mother   . Myasthenia gravis Brother        ocular  . Skin cancer Brother   . Stroke Paternal Grandfather   . Breast cancer Neg Hx      Social History: Social History   Socioeconomic History  . Marital status: Single    Spouse name: Not on file  . Number of children: 0  . Years of education: Special Ed  . Highest education level: Not on file  Occupational History  . Occupation: Unemployed  Tobacco Use  . Smoking status: Never Smoker  . Smokeless tobacco: Never Used  Vaping Use  . Vaping Use: Never used  Substance and  Sexual Activity  . Alcohol use: No    Alcohol/week: 0.0 standard drinks  . Drug use: No  . Sexual activity: Not Currently  Other Topics Concern  . Not on file  Social History Narrative   06/23/17 lives with sister, [name]   Regular exercise-no   Caffeine Use-yes   Social Determinants of Health   Financial Resource Strain: Not on file  Food Insecurity: Not on file  Transportation Needs: Not on file  Physical Activity: Not on file  Stress: Not on file  Social Connections: Not on file  Intimate Partner Violence: Not on file     Review of Systems: Gen: No fevers or chills HEENT: No vision or hearing complaints CV: Some shortness of breath with exertion Resp: Wheezing, coughing GI: Appetite is good GU : No problems reported MS: No complaints Derm:    No complaints Psych: School until ninth grade.  Dependent on sister for care at home Heme: No complaints Neuro: No complaints Endocrine.  No complaints  Vital Signs: Blood pressure 122/89, pulse (!) 113, temperature 97.8 F (36.6 C), resp. rate 16, height 5\' 3"  (1.6 m), weight 89.8 kg, SpO2 98 %.   Intake/Output Summary (Last 24 hours) at 04/08/2020 1016 Last data filed at 04/08/2020 1007 [place] per 24 hour  Intake 1462.25 ml  Output -  Net 1462.25 ml    Weight trends: Filed Weights   04/07/20 1635  Weight: 89.8 kg    Physical Exam: General:  Ill-appearing, laying in  the bed  HEENT  anicteric, moist oral mucous membranes  Neck:  Supple, no JVD, no masses  Lungs:  Mild diffuse wheezing bilaterally, left basilar crackles  Heart::  Regular, no rub  Abdomen:  Soft, nontender  Extremities:  No edema  Neurologic:  Alert, oriented  Skin:  No acute rashes    Lab results: Basic Metabolic Panel: Recent Labs  Lab 04/06/20 1124 04/07/20 0535 04/08/20 0413  NA 126* 125* 124*  K 4.1 4.1 4.1  CL 92* 92* 94*  CO2 25 23 22   GLUCOSE 153* 135* 143*  BUN 21 20 21   CREATININE 0.99 0.78 0.81  CALCIUM 9.0 8.5* 8.4*  PHOS   --  3.7  --     Liver Function Tests: Recent Labs  Lab 04/07/20 0535  AST 20  ALT 24  ALKPHOS 58  BILITOT 0.9  PROT 6.1*  ALBUMIN 2.7*   No results for input(s): LIPASE, AMYLASE in the last 168 hours. No results for input(s): AMMONIA in the last 168 hours.  CBC: Recent Labs  Lab 04/06/20 1124 04/07/20 0535 04/08/20 0413  WBC 17.3* 12.7* 11.9*  NEUTROABS 15.2* 10.1*  --   HGB 11.2* 10.5* 10.8*  HCT 34.1* 31.9* 32.8*  MCV 92.4 93.3 91.9  PLT 224 212 223    Cardiac Enzymes: No results for input(s): CKTOTAL, TROPONINI in the last 168 hours.  BNP: Invalid input(s): POCBNP  CBG: No results for input(s): GLUCAP in the last 168 hours.  Microbiology: Recent Results (from the past 720 hour(s))  Blood culture (routine x 2)     Status: None (Preliminary result)   Collection Time: 04/06/20  1:50 PM   Specimen: BLOOD  Result Value Ref Range Status   Specimen Description BLOOD LEFT ANTECUBITAL  Final   Special Requests   Final    BOTTLES DRAWN AEROBIC AND ANAEROBIC Blood Culture results may not be optimal due to an inadequate volume of blood received in culture bottles   Culture   Final    NO GROWTH 2 DAYS Performed at [facility], [address], [place], [place] [address]    Report Status PENDING  Incomplete  Blood culture (routine x 2)     Status: None (Preliminary result)   Collection Time: 04/06/20  1:50 PM   Specimen: BLOOD  Result Value Ref Range Status   Specimen Description BLOOD RIGHT ANTECUBITAL  Final   Special Requests   Final    BOTTLES DRAWN AEROBIC AND ANAEROBIC Blood Culture results may not be optimal due to an inadequate volume of blood received in culture bottles   Culture   Final    NO GROWTH 2 DAYS Performed at [facility], [address] [place], [place] [address]    Report Status PENDING  Incomplete  Resp Panel by RT-PCR (Flu A&B, Covid) Nasopharyngeal Swab     Status: None   Collection Time: 04/06/20  1:50 PM    Specimen: Nasopharyngeal Swab; Nasopharyngeal(NP) swabs in vial transport medium  Result Value Ref Range Status   SARS Coronavirus 2 by RT PCR NEGATIVE NEGATIVE Final    Comment: (NOTE) SARS-CoV-2 target nucleic acids are NOT DETECTED.  The SARS-CoV-2 RNA is generally detectable in upper respiratory specimens during the acute phase of infection. The lowest concentration of SARS-CoV-2 viral copies this assay can detect is 138 copies/mL. A negative result does not preclude SARS-Cov-2 infection and should not be used as the sole basis for treatment or other patient management decisions. A negative result may  occur with  improper specimen collection/handling, submission of specimen other than nasopharyngeal swab, presence of viral mutation(s) within the areas targeted by this assay, and inadequate number of viral copies(<138 copies/mL). A negative result must be combined with clinical observations, patient history, and epidemiological information. The expected result is Negative.  Fact Sheet for Patients:  EntrepreneurPulse.com.au  Fact Sheet for Healthcare Providers:  IncredibleEmployment.be  This test is no t yet approved or cleared by the [place] FDA and  has been authorized for detection and/or diagnosis of SARS-CoV-2 by FDA under an Emergency Use Authorization (EUA). This EUA will remain  in effect (meaning this test can be used) for the duration of the COVID-19 declaration under Section 564(b)(1) of the Act, 21 U.S.C.section 360bbb-3(b)(1), unless the authorization is terminated  or revoked sooner.       Influenza A by PCR NEGATIVE NEGATIVE Final   Influenza B by PCR NEGATIVE NEGATIVE Final    Comment: (NOTE) The Xpert Xpress SARS-CoV-2/FLU/RSV plus assay is intended as an aid in the diagnosis of influenza from Nasopharyngeal swab specimens and should not be used as a sole basis for treatment. Nasal washings and aspirates are  unacceptable for Xpert Xpress SARS-CoV-2/FLU/RSV testing.  Fact Sheet for Patients: EntrepreneurPulse.com.au  Fact Sheet for Healthcare Providers: IncredibleEmployment.be  This test is not yet approved or cleared by the [place] FDA and has been authorized for detection and/or diagnosis of SARS-CoV-2 by FDA under an Emergency Use Authorization (EUA). This EUA will remain in effect (meaning this test can be used) for the duration of the COVID-19 declaration under Section 564(b)(1) of the Act, 21 U.S.C. section 360bbb-3(b)(1), unless the authorization is terminated or revoked.  Performed at [facility], [address] [place], [place] [address]      Coagulation Studies: No results for input(s): LABPROT, INR in the last 72 hours.  Urinalysis: Recent Labs    04/06/20 1503  COLORURINE YELLOW*  LABSPEC 1.010  PHURINE 6.0  GLUCOSEU NEGATIVE  HGBUR NEGATIVE  BILIRUBINUR NEGATIVE  KETONESUR NEGATIVE  PROTEINUR NEGATIVE  NITRITE NEGATIVE  LEUKOCYTESUR NEGATIVE        Imaging: DG Chest 2 View  Result Date: 04/06/2020 CLINICAL DATA:  Shortness of breath.  Chest pain. EXAM: CHEST - 2 VIEW COMPARISON:  November 04, 2019. FINDINGS: Mild cardiomegaly is noted with central pulmonary vascular congestion. No pneumothorax is noted. Left basilar atelectasis is noted with small left pleural effusion. Stable elevated left hemidiaphragm. Bony thorax is unremarkable. IMPRESSION: Stable elevated left hemidiaphragm. Left basilar atelectasis with small left pleural effusion. Electronically Signed   By: Marijo Conception M.D.   On: 04/06/2020 11:56   US Venous Img Lower Bilateral (DVT)  Result Date: 04/07/2020 CLINICAL DATA:  Swelling. EXAM: BILATERAL LOWER EXTREMITY VENOUS DOPPLER ULTRASOUND TECHNIQUE: Gray-scale sonography with compression, as well as color and duplex ultrasound, were performed to evaluate the deep venous system(s) from the  level of the common femoral vein through the popliteal and proximal calf veins. COMPARISON:  None. FINDINGS: VENOUS Normal compressibility of the common femoral, superficial femoral, and popliteal veins, as well as the visualized calf veins. Visualized portions of profunda femoral vein and great saphenous vein unremarkable. No filling defects to suggest DVT on grayscale or color Doppler imaging. Doppler waveforms show normal direction of venous flow, normal respiratory plasticity and response to augmentation. Limited views of the contralateral common femoral vein are unremarkable. OTHER None. Limitations: none IMPRESSION: Negative. Electronically Signed   By: Dorise Bullion III M.D  On: 04/07/2020 09:51      Assessment & Plan: Pt is a 70 y.o.   female with medical problems of obstructive sleep apnea, seizure, GERD, hyperlipidemia, hypertension, hypothyroidism and myasthenia gravis and takes methotrexate and Mestinon , was admitted on 04/06/2020 with Swelling [R60.9] Myasthenia ([place]) [G70.00] CAP (community acquired pneumonia) [J18.9] Immunosuppression ([place]) [D84.9] Community acquired pneumonia of left lower lobe of lung [J18.9]    #Hyponatremia Patient's baseline sodium level was normal at 138 on March 30, 2020 Within a week, admission sodium of 126 Sodium level has trended down to 124 this morning Work-up so far shows TSH is normal at 4.334 Differential diagnosis of hyponatremia includes hypervolemic hyponatremia versus SIADH with underlying pneumonia For now, continue fluid restriction Avoid maintenance IV fluids Treatment of pneumonia with broad-spectrum antibiotics as you are doing If sodium worsens tomorrow, will consider 3% (hypertonic) saline    LOS: 2 Jovani Colquhoun 2/20/202210:16 AM    Note: This note was prepared with Dragon dictation. Any transcription errors are unintentional

## 2020-04-08 NOTE — Progress Notes (Signed)
Brief Neuro Update:  Please see my note from earlie today for full recs.  Her NIFS are down to 10. I discussed with Dr. Manuella Ghazi and we will hold off on any more steroids for now and I discontinued her Diltiazem.  [place] Pager Number [phone]

## 2020-04-08 NOTE — Progress Notes (Addendum)
[facility] [place] NAME: Madison Burns    MR#:  [id number]  DATE OF BIRTH:  1951/01/30  SUBJECTIVE:  CHIEF COMPLAINT:   Chief Complaint  Patient presents with  . Shortness of Breath  . Dysphagia  While I was in the patient's room, patient was trying to talk while eating and choked on the food.  Took good 3 to 5 minutes for her to calm down with some deep breathing exercises which her sister at bedside helped her perform.  Patient was audibly wheezing and coughing since then REVIEW OF SYSTEMS:  Review of Systems  Constitutional: Positive for malaise/fatigue. Negative for diaphoresis, fever and weight loss.  HENT: Negative for ear discharge, ear pain, hearing loss, nosebleeds, sore throat and tinnitus.   Eyes: Negative for blurred vision and pain.  Respiratory: Positive for cough, shortness of breath and wheezing. Negative for hemoptysis.   Cardiovascular: Negative for chest pain, palpitations, orthopnea and leg swelling.  Gastrointestinal: Positive for constipation. Negative for abdominal pain, blood in stool, diarrhea, heartburn, nausea and vomiting.  Genitourinary: Negative for dysuria, frequency and urgency.  Musculoskeletal: Negative for back pain and myalgias.  Skin: Negative for itching and rash.  Neurological: Negative for dizziness, tingling, tremors, focal weakness, seizures, weakness and headaches.  Psychiatric/Behavioral: Negative for depression. The patient is not nervous/anxious.    DRUG ALLERGIES:  No Known Allergies VITALS:  Blood pressure 132/80, pulse (!) 114, temperature 97.8 F (36.6 C), resp. rate 18, height 5\' 3"  (1.6 m), weight 89.8 kg, SpO2 97 %. PHYSICAL EXAMINATION:  Physical Exam Constitutional:      General: She is in acute distress.     Appearance: She is ill-appearing.  HENT:     Head: Normocephalic and atraumatic.  Eyes:     Conjunctiva/sclera: Conjunctivae normal.     Pupils: Pupils are equal, round, and  reactive to light.  Neck:     Thyroid: No thyromegaly.     Trachea: No tracheal deviation.  Cardiovascular:     Rate and Rhythm: Normal rate and regular rhythm.     Heart sounds: Normal heart sounds.  Pulmonary:     Effort: Tachypnea and accessory muscle usage present. No respiratory distress.     Breath sounds: Examination of the right-lower field reveals decreased breath sounds, wheezing and rhonchi. Examination of the left-lower field reveals decreased breath sounds, wheezing and rhonchi. Decreased breath sounds, wheezing and rhonchi present.  Chest:     Chest wall: No tenderness.  Abdominal:     General: Bowel sounds are normal. There is distension.     Palpations: Abdomen is soft.     Tenderness: There is no abdominal tenderness.  Musculoskeletal:        General: Normal range of motion.     Cervical back: Normal range of motion and neck supple.  Skin:    General: Skin is warm and dry.     Findings: No rash.  Neurological:     Mental Status: She is alert and oriented to person, place, and time.     Cranial Nerves: No cranial nerve deficit.  Psychiatric:        Behavior: Behavior is agitated.    LABORATORY PANEL:  Female CBC Recent Labs  Lab 04/08/20 0413  WBC 11.9*  HGB 10.8*  HCT 32.8*  PLT 223   ------------------------------------------------------------------------------------------------------------------ Chemistries  Recent Labs  Lab 04/07/20 0535 04/08/20 0413  NA 125* 124*  K 4.1 4.1  CL 92* 94*  CO2 23 22  GLUCOSE 135* 143*  BUN 20 21  CREATININE 0.78 0.81  CALCIUM 8.5* 8.4*  AST 20  --   ALT 24  --   ALKPHOS 58  --   BILITOT 0.9  --    RADIOLOGY:  No results found. ASSESSMENT AND PLAN:  70 y.o. female with medical history significant for myasthenia gravis, cognitive impairment, acquired hypothyroidism, obstructive sleep apnea compliant on home nocturnal CPAP, hypertension, hyperlipidemia admitted for sepsis due to left basilar pneumonia after  presenting from home to [facility] ED complaining of shortness of breath.   2/18 admitted for acute hypoxic respiratory failure thought to be due to pneumonia, hyponatremia and myasthenia flare 2/19 requiring BiPAP initially and later weaned off to nasal cannula 2/20 had choking episode while eating breakfast followed by constant coughing and audible wheezing requiring IV steroid and DuoNeb.  Sodium continues to drop at 124.  Nephrology consult requested  Sepsis ruled out -normal lactic acid  Acute hypoxic respiratory failure present on admission and requiring BiPAP thought to be due to Community-acquired pneumonia Procalcitonin normal but patient's sister feels very strongly about continuing antibiotics so will do so for now as patient does have ongoing respiratory symptoms. Continue vancomycin and cefepime for now Continue nasal cannula oxygen.  BiPAP/CPAP as needed Patient had a choking episode while eating her breakfast and subsequently leading to audible wheezing and constant coughing.  It took about 3 to 5 minutes for her to settle down after her sisters help with some deep breathing exercises We will start IV Solu-Medrol 60 mg once daily and DuoNeb every 6 hours for now  Myasthenia gravis exacerbation Continue IVIG.  Starting IV Solu-Medrol for her breathing issue may make myasthenia worse so we will have to monitor her closely Neuro following every 6 hours NIF and vital capacity checks, Mestinon 60 mg p.o. 3 times daily   Hyponatremia Sodium 138-> 126-> 125-> 124 Consult nephrology.  Monitor sodium  Hypothyroidism Normal TSH, continue Synthroid 75 mcg once daily  Lower extremity pain  DVT ruled out by venous Dopplers  Obesity Body mass index is 35.07 kg/m.  Net IO Since Admission: 802.25 mL [04/08/20 1214]   She looks critically sick and high risk for cardiorespiratory failure.  I discussed with her sister at bedside who says patient would not want to get intubation or  mechanical ventilation.  We will request palliative care consultation  Status is: Inpatient  Remains inpatient appropriate because:IV treatments appropriate due to intensity of illness or inability to take PO   Dispo: The patient is from: Home              Anticipated d/c is to: Home              Anticipated d/c date is: 2 days              Patient currently is not medically stable to d/c.   Difficult to place patient No   DVT prophylaxis:       SCDs Start: 04/06/20 1635     Family Communication: Sister updated at bedside on 2/ 20   All the records are reviewed and case discussed with Care Management/Social Worker. Management plans discussed with the patient, family/sister and they are in agreement.  CODE STATUS: Partial Code Level of care: Med-Surg  TOTAL TIME TAKING CARE OF THIS PATIENT: 35 minutes.   More than 50% of the time was spent in counseling/coordination of care: YES  POSSIBLE D/C  IN 2-3 DAYS, DEPENDING ON CLINICAL CONDITION.   Max Sane M.D on 04/08/2020 at 12:14 PM  [facility]   CC: Primary care physician; Einar Pheasant, MD  Note: This dictation was prepared with Dragon dictation along with smaller phrase technology. Any transcriptional errors that result from this process are unintentional.

## 2020-04-08 NOTE — Progress Notes (Signed)
Pt. Unable to perform VC. Nif  Is -10

## 2020-04-08 NOTE — Hospital Course (Addendum)
2/18 admitted for acute hypoxic respiratory failure thought to be due to pneumonia, hyponatremia and myasthenia flare 2/19 requiring BiPAP initially and later weaned off to nasal cannula 2/20 had choking episode while eating breakfast followed by constant coughing and audible wheezing requiring IV steroid and DuoNeb.  Sodium continues to drop at 124.  Nephrology consult requested 2/21 remains in rapid A. fib the heart rate in 110s to 130s.  Cardiology consult.  Sodium improved at 130. 2/22 heart rate in much better control with oral amiodarone.  Palliative care evaluation

## 2020-04-08 NOTE — Progress Notes (Signed)
   04/08/20 0810  Assess: MEWS Score  Temp 97.8 F (36.6 C)  BP 122/89  Pulse Rate (!) 113  Resp 16  SpO2 98 %  O2 Device Nasal Cannula  O2 Flow Rate (L/min) 3 L/min  Assess: MEWS Score  MEWS Temp 0  MEWS Systolic 0  MEWS Pulse 2  MEWS RR 0  MEWS LOC 0  MEWS Score 2  MEWS Score Color Yellow  Notify: Provider  Provider Name/Title Max Sane  Date Provider Notified 04/08/20  Time Provider Notified 1256  Notification Type Page  Notification Reason Other (Comment) (elevated HR)  Provider response See new orders (Cardizem ordered)

## 2020-04-08 NOTE — Progress Notes (Signed)
Auto cpap initiated with FFM & O2 at 4 lpm.  Tol well.

## 2020-04-09 ENCOUNTER — Inpatient Hospital Stay: Payer: PPO

## 2020-04-09 ENCOUNTER — Encounter: Payer: Self-pay | Admitting: Internal Medicine

## 2020-04-09 DIAGNOSIS — Z7189 Other specified counseling: Secondary | ICD-10-CM

## 2020-04-09 DIAGNOSIS — Z515 Encounter for palliative care: Secondary | ICD-10-CM

## 2020-04-09 DIAGNOSIS — I4891 Unspecified atrial fibrillation: Secondary | ICD-10-CM

## 2020-04-09 LAB — CBC
HCT: 30.6 % — ABNORMAL LOW (ref 36.0–46.0)
Hemoglobin: 10.2 g/dL — ABNORMAL LOW (ref 12.0–15.0)
MCH: 30.7 pg (ref 26.0–34.0)
MCHC: 33.3 g/dL (ref 30.0–36.0)
MCV: 92.2 fL (ref 80.0–100.0)
Platelets: 230 10*3/uL (ref 150–400)
RBC: 3.32 MIL/uL — ABNORMAL LOW (ref 3.87–5.11)
RDW: 15 % (ref 11.5–15.5)
WBC: 13.4 10*3/uL — ABNORMAL HIGH (ref 4.0–10.5)
nRBC: 0 % (ref 0.0–0.2)

## 2020-04-09 LAB — BASIC METABOLIC PANEL
Anion gap: 6 (ref 5–15)
BUN: 20 mg/dL (ref 8–23)
CO2: 25 mmol/L (ref 22–32)
Calcium: 8.3 mg/dL — ABNORMAL LOW (ref 8.9–10.3)
Chloride: 99 mmol/L (ref 98–111)
Creatinine, Ser: 0.76 mg/dL (ref 0.44–1.00)
GFR, Estimated: >60 mL/min (ref >60)
Glucose, Bld: 145 mg/dL — ABNORMAL HIGH (ref 70–99)
Potassium: 4.9 mmol/L (ref 3.5–5.1)
Sodium: 130 mmol/L — ABNORMAL LOW (ref 135–145)

## 2020-04-09 LAB — PROCALCITONIN: Procalcitonin: 0.11 ng/mL

## 2020-04-09 MED ORDER — AMIODARONE HCL IN DEXTROSE 360-4.14 MG/200ML-% IV SOLN
30.0000 mg/h | INTRAVENOUS | Status: DC
  Filled 2020-04-09: qty 200

## 2020-04-09 MED ORDER — AMIODARONE HCL 200 MG PO TABS
200.0000 mg | ORAL_TABLET | Freq: Two times a day (BID) | ORAL | Status: DC
  Administered 2020-04-09 – 2020-04-12 (×7): 200 mg via ORAL
  Filled 2020-04-09 (×7): qty 1

## 2020-04-09 MED ORDER — AMIODARONE LOAD VIA INFUSION
150.0000 mg | Freq: Once | INTRAVENOUS | Status: DC
  Filled 2020-04-09: qty 83.34

## 2020-04-09 MED ORDER — AMIODARONE HCL IN DEXTROSE 360-4.14 MG/200ML-% IV SOLN
60.0000 mg/h | INTRAVENOUS | Status: DC
  Filled 2020-04-09: qty 200

## 2020-04-09 MED ORDER — IPRATROPIUM-ALBUTEROL 0.5-2.5 (3) MG/3ML IN SOLN
3.0000 mL | Freq: Three times a day (TID) | RESPIRATORY_TRACT | Status: DC
  Administered 2020-04-09 – 2020-04-13 (×13): 3 mL via RESPIRATORY_TRACT
  Filled 2020-04-09 (×14): qty 3

## 2020-04-09 MED ORDER — DILTIAZEM HCL ER COATED BEADS 180 MG PO CP24: 180.0000 mg | ORAL_CAPSULE | Freq: Every day | ORAL | Status: DC

## 2020-04-09 NOTE — Progress Notes (Signed)
[facility] [place] NAME: Madison Burns    MR#:  [id number]  DATE OF BIRTH:  01-15-1951  SUBJECTIVE:  CHIEF COMPLAINT:   Chief Complaint  Patient presents with  . Shortness of Breath  . Dysphagia  Much more comfortable this morning when I saw her.  Remains in rapid A. fib with heart rate in 110s to 130s, sister at bedside REVIEW OF SYSTEMS:  Review of Systems  Constitutional: Positive for malaise/fatigue. Negative for diaphoresis, fever and weight loss.  HENT: Negative for ear discharge, ear pain, hearing loss, nosebleeds, sore throat and tinnitus.   Eyes: Negative for blurred vision and pain.  Respiratory: Positive for cough, shortness of breath and wheezing. Negative for hemoptysis.   Cardiovascular: Negative for chest pain, palpitations, orthopnea and leg swelling.  Gastrointestinal: Positive for constipation. Negative for abdominal pain, blood in stool, diarrhea, heartburn, nausea and vomiting.  Genitourinary: Negative for dysuria, frequency and urgency.  Musculoskeletal: Negative for back pain and myalgias.  Skin: Negative for itching and rash.  Neurological: Negative for dizziness, tingling, tremors, focal weakness, seizures, weakness and headaches.  Psychiatric/Behavioral: Negative for depression. The patient is not nervous/anxious.    DRUG ALLERGIES:  No Known Allergies VITALS:  Blood pressure 129/70, pulse 95, temperature 98.7 F (37.1 C), resp. rate 20, height 5\' 3"  (1.6 m), weight 89.8 kg, SpO2 95 %. PHYSICAL EXAMINATION:  Physical Exam Constitutional:      Appearance: She is ill-appearing.  HENT:     Head: Normocephalic and atraumatic.  Eyes:     Conjunctiva/sclera: Conjunctivae normal.     Pupils: Pupils are equal, round, and reactive to light.  Neck:     Thyroid: No thyromegaly.     Trachea: No tracheal deviation.  Cardiovascular:     Rate and Rhythm: Normal rate and regular rhythm.     Heart sounds: Normal heart sounds.   Pulmonary:     Effort: No respiratory distress.     Breath sounds: Examination of the right-lower field reveals decreased breath sounds, wheezing and rhonchi. Examination of the left-lower field reveals decreased breath sounds, wheezing and rhonchi. Decreased breath sounds, wheezing and rhonchi present.  Chest:     Chest wall: No tenderness.  Abdominal:     General: Bowel sounds are normal. There is distension.     Palpations: Abdomen is soft.     Tenderness: There is no abdominal tenderness.  Musculoskeletal:        General: Normal range of motion.     Cervical back: Normal range of motion and neck supple.  Skin:    General: Skin is warm and dry.     Findings: No rash.  Neurological:     Mental Status: She is alert and oriented to person, place, and time.     Cranial Nerves: No cranial nerve deficit.    LABORATORY PANEL:  Female CBC Recent Labs  Lab 04/09/20 0456  WBC 13.4*  HGB 10.2*  HCT 30.6*  PLT 230   ------------------------------------------------------------------------------------------------------------------ Chemistries  Recent Labs  Lab 04/07/20 0535 04/08/20 0413 04/09/20 0456  NA 125*   < > 130*  K 4.1   < > 4.9  CL 92*   < > 99  CO2 23   < > 25  GLUCOSE 135*   < > 145*  BUN 20   < > 20  CREATININE 0.78   < > 0.76  CALCIUM 8.5*   < > 8.3*  AST 20  --   --   ALT 24  --   --   ALKPHOS 58  --   --   BILITOT 0.9  --   --    < > = values in this interval not displayed.   RADIOLOGY:  No results found. ASSESSMENT AND PLAN:  70 y.o. female with medical history significant for myasthenia gravis, cognitive impairment, acquired hypothyroidism, obstructive sleep apnea compliant on home nocturnal CPAP, hypertension, hyperlipidemia admitted for sepsis due to left basilar pneumonia after presenting from home to [facility] ED complaining of shortness of breath.   2/18 admitted for acute hypoxic respiratory failure thought to be due to pneumonia, hyponatremia and  myasthenia flare 2/19 requiring BiPAP initially and later weaned off to nasal cannula 2/20 had choking episode while eating breakfast followed by constant coughing and audible wheezing requiring IV steroid and DuoNeb.  Sodium continues to drop at 124.  Nephrology consult requested 2/21 remains in rapid A. fib the heart rate in 110s to 130s.  Cardiology consult.  Sodium improved at 130  Sepsis ruled out -normal lactic acid  Acute hypoxic respiratory failure present on admission and requiring BiPAP thought to be due to Community-acquired pneumonia Procalcitonin normal but patient's sister feels very strongly about continuing antibiotics so will do so for now as patient does have ongoing respiratory symptoms.  Repeat chest x-ray from yesterday does show infiltrate on the right side so antibiotics continued. Continue cefepime for now.  Discontinue vancomycin as MRSA screen is negative Continue 3 L nasal cannula oxygen.  BiPAP/CPAP as needed Patient is at high risk for aspiration especially when she does impulsive eating when she chokes and aspirates.  Speech therapy has evaluated and counseled patient about this  New onset atrial fibrillation with RVR Heart rate ranging from 110s to 130s Cardiology consult, I had started on Cardizem CD yesterday but was later discontinued by neurology considering possibility of worsening myasthenia gravis flare.  Starting amiodarone per cardiology  Myasthenia gravis exacerbation Continue IVIG.  every 6 hours NIF and vital capacity checks, Mestinon 60 mg p.o. 3 times daily  NIF -20 this morning  Hyponatremia Sodium 138-> 126-> 125-> 124->130 Fluid restriction.  Nephrology following  Hypothyroidism Normal TSH, continue Synthroid 75 mcg once daily  Lower extremity pain -> resolved DVT ruled out by venous Dopplers  Obesity Body mass index is 35.07 kg/m.  Net IO Since Admission: -937.75 mL [04/09/20 1509]    palliative care consultation pending for goals  of care.  Overall poor prognosis and high risk for recurrent aspiration  Status is: Inpatient  Remains inpatient appropriate because:IV treatments appropriate due to intensity of illness or inability to take PO   Dispo: The patient is from: Home              Anticipated d/c is to: Home              Anticipated d/c date is: 2 days              Patient currently is not medically stable to d/c.  Still has rapid A. fib   Difficult to place patient No   DVT prophylaxis:       SCDs Start: 04/06/20 1635     Family Communication: Sister updated at bedside on 2/ 21   All the records are reviewed and case discussed with Care Management/Social Worker. Management plans discussed with the patient, family/sister and they are in agreement.  CODE STATUS: Partial Code Level of care:  Med-Surg  TOTAL TIME TAKING CARE OF THIS PATIENT: 35 minutes.   More than 50% of the time was spent in counseling/coordination of care: YES  POSSIBLE D/C IN 2-3 DAYS, DEPENDING ON CLINICAL CONDITION.   Max Sane M.D on 04/09/2020 at 3:09 PM  [facility]   CC: Primary care physician; Einar Pheasant, MD  Note: This dictation was prepared with Dragon dictation along with smaller phrase technology. Any transcriptional errors that result from this process are unintentional.

## 2020-04-09 NOTE — Progress Notes (Signed)
NIF -20

## 2020-04-09 NOTE — Progress Notes (Signed)
   04/09/20 1309  Assess: MEWS Score  Temp 98.7 F (37.1 C)  BP 129/70  Pulse Rate (!) 113  Resp 20  SpO2 98 %  O2 Device Nasal Cannula  O2 Flow Rate (L/min) 3 L/min  Assess: MEWS Score  MEWS Temp 0  MEWS Systolic 0  MEWS Pulse 2  MEWS RR 0  MEWS LOC 0  MEWS Score 2  MEWS Score Color Yellow  Assess: if the MEWS score is Yellow or Red  Were vital signs taken at a resting state? Yes  Focused Assessment No change from prior assessment  Early Detection of Sepsis Score *See Row Information* Low  MEWS guidelines implemented *See Row Information* Yes  Treat  MEWS Interventions Other (Comment) (notifeied MD EKG)  Pain Scale 0-10  Pain Score 0  Notify: Charge Nurse/RN  Name of Charge Nurse/RN Notified Nicola Girt  Date Charge Nurse/RN Notified 04/09/20  Time Charge Nurse/RN Notified 5  Notify: Provider  Provider Name/Title Max Sane  Date Provider Notified 04/09/20  Time Provider Notified 1300  Notification Type Page  Notification Reason Change in status (A fib)  Provider response See new orders (Consult Cardiology ekg)  Date of Provider Response 04/09/20  Time of Provider Response [phone]

## 2020-04-09 NOTE — Progress Notes (Signed)
Pt unable to perform VC and NIF, tired and ready for breathing treatment. Put pt on her cpap once treatment was done. Pt changed to TID treatments

## 2020-04-09 NOTE — Progress Notes (Signed)
[place], [place] 04/09/20  Subjective:   Madison Burns is a 70 y.o. femail with PMHX of cognitive impairment, Myasthenia Gravis, acquired hypothryroidism, obstructive sleep apnea with CPAP, HTN, and Hyperlipidemia. She presents to ED with shortness of breath.   She is admitted with left basilar pneumonia resulting in sepsis.  She has been placed on antibiotics.  During her admission, sodium levels decreased to 124.   Patient is seen today sitting in chair. Alert and appropriately oriented Currently on room air Denies shortness of breath and chest pain Able to eat meals Voiding on bedside commode  UOP 1.9L  Objective:  Vital signs in last 24 hours:  Temp:  [97.8 F (36.6 C)-98.7 F (37.1 C)] 98 F (36.7 C) (02/21 0817) Pulse Rate:  [72-114] 72 (02/21 0819) Resp:  [16-20] 16 (02/21 0817) BP: (93-132)/(46-80) 112/64 (02/21 0819) SpO2:  [93 %-97 %] 96 % (02/21 0817) FiO2 (%):  [32 %] 32 % (02/20 2300)  Weight change:  Filed Weights   04/07/20 1635  Weight: 89.8 kg    Intake/Output:    Intake/Output Summary (Last 24 hours) at 04/09/2020 1024 Last data filed at 04/09/2020 1010 [place] per 24 hour  Intake 720 ml  Output 1000 ml  Net -280 ml    Physical Exam: General:  NAD, sitting in chair  HEENT  anicteric, moist oral mucous membranes  Neck:  Supple, no JVD, no masses  Lungs:  Clear  Heart::  Regular, no rub  Abdomen:  Soft, nontender  Extremities:  No edema  Neurologic:  Alert, oriented  Skin:  No acute rashes    Basic Metabolic Panel:  Recent Labs  Lab 04/06/20 1124 04/07/20 0535 04/08/20 0413 04/09/20 0456  NA 126* 125* 124* 130*  K 4.1 4.1 4.1 4.9  CL 92* 92* 94* 99  CO2 25 23 22 25   GLUCOSE 153* 135* 143* 145*  BUN 21 20 21 20   CREATININE 0.99 0.78 0.81 0.76  CALCIUM 9.0 8.5* 8.4* 8.3*  PHOS  --  3.7  --   --      CBC: Recent Labs  Lab 04/06/20 1124 04/07/20 0535 04/08/20 0413 04/09/20 0456  WBC 17.3*  12.7* 11.9* 13.4*  NEUTROABS 15.2* 10.1*  --   --   HGB 11.2* 10.5* 10.8* 10.2*  HCT 34.1* 31.9* 32.8* 30.6*  MCV 92.4 93.3 91.9 92.2  PLT 224 212 223 230     No results found for: HEPBSAG, HEPBSAB, HEPBIGM    Microbiology:  Recent Results (from the past 240 hour(s))  MRSA PCR Screening     Status: None   Collection Time: 04/06/20  9:50 AM   Specimen: Nasopharyngeal  Result Value Ref Range Status   MRSA by PCR NEGATIVE NEGATIVE Final    Comment:        The GeneXpert MRSA Assay (FDA approved for NASAL specimens only), is one component of a comprehensive MRSA colonization surveillance program. It is not intended to diagnose MRSA infection nor to guide or monitor treatment for MRSA infections. Performed at [facility], [address] [place], [place] [address]   Blood culture (routine x 2)     Status: None (Preliminary result)   Collection Time: 04/06/20  1:50 PM   Specimen: BLOOD  Result Value Ref Range Status   Specimen Description BLOOD LEFT ANTECUBITAL  Final   Special Requests   Final    BOTTLES DRAWN AEROBIC AND ANAEROBIC Blood Culture results may not be optimal due to an inadequate  volume of blood received in culture bottles   Culture   Final    NO GROWTH 3 DAYS Performed at [facility], [address] [place], [place] [address]    Report Status PENDING  Incomplete  Blood culture (routine x 2)     Status: None (Preliminary result)   Collection Time: 04/06/20  1:50 PM   Specimen: BLOOD  Result Value Ref Range Status   Specimen Description BLOOD RIGHT ANTECUBITAL  Final   Special Requests   Final    BOTTLES DRAWN AEROBIC AND ANAEROBIC Blood Culture results may not be optimal due to an inadequate volume of blood received in culture bottles   Culture   Final    NO GROWTH 3 DAYS Performed at [facility], [address], [place], [place] [address]    Report Status PENDING  Incomplete  Resp Panel by RT-PCR (Flu A&B, Covid)  Nasopharyngeal Swab     Status: None   Collection Time: 04/06/20  1:50 PM   Specimen: Nasopharyngeal Swab; Nasopharyngeal(NP) swabs in vial transport medium  Result Value Ref Range Status   SARS Coronavirus 2 by RT PCR NEGATIVE NEGATIVE Final    Comment: (NOTE) SARS-CoV-2 target nucleic acids are NOT DETECTED.  The SARS-CoV-2 RNA is generally detectable in upper respiratory specimens during the acute phase of infection. The lowest concentration of SARS-CoV-2 viral copies this assay can detect is 138 copies/mL. A negative result does not preclude SARS-Cov-2 infection and should not be used as the sole basis for treatment or other patient management decisions. A negative result may occur with  improper specimen collection/handling, submission of specimen other than nasopharyngeal swab, presence of viral mutation(s) within the areas targeted by this assay, and inadequate number of viral copies(<138 copies/mL). A negative result must be combined with clinical observations, patient history, and epidemiological information. The expected result is Negative.  Fact Sheet for Patients:  EntrepreneurPulse.com.au  Fact Sheet for Healthcare Providers:  IncredibleEmployment.be  This test is no t yet approved or cleared by the [place] FDA and  has been authorized for detection and/or diagnosis of SARS-CoV-2 by FDA under an Emergency Use Authorization (EUA). This EUA will remain  in effect (meaning this test can be used) for the duration of the COVID-19 declaration under Section 564(b)(1) of the Act, 21 U.S.C.section 360bbb-3(b)(1), unless the authorization is terminated  or revoked sooner.       Influenza A by PCR NEGATIVE NEGATIVE Final   Influenza B by PCR NEGATIVE NEGATIVE Final    Comment: (NOTE) The Xpert Xpress SARS-CoV-2/FLU/RSV plus assay is intended as an aid in the diagnosis of influenza from Nasopharyngeal swab specimens and should not be  used as a sole basis for treatment. Nasal washings and aspirates are unacceptable for Xpert Xpress SARS-CoV-2/FLU/RSV testing.  Fact Sheet for Patients: EntrepreneurPulse.com.au  Fact Sheet for Healthcare Providers: IncredibleEmployment.be  This test is not yet approved or cleared by the [place] FDA and has been authorized for detection and/or diagnosis of SARS-CoV-2 by FDA under an Emergency Use Authorization (EUA). This EUA will remain in effect (meaning this test can be used) for the duration of the COVID-19 declaration under Section 564(b)(1) of the Act, 21 U.S.C. section 360bbb-3(b)(1), unless the authorization is terminated or revoked.  Performed at [facility], [address] [place],  [address]     Coagulation Studies: No results for input(s): LABPROT, INR in the last 72 hours.  Urinalysis: Recent Labs    04/06/20 [place]  YELLOW*  LABSPEC 1.010  PHURINE 6.0  GLUCOSEU NEGATIVE  HGBUR NEGATIVE  BILIRUBINUR NEGATIVE  KETONESUR NEGATIVE  PROTEINUR NEGATIVE  NITRITE NEGATIVE  LEUKOCYTESUR NEGATIVE      Imaging: DG Chest Port 1 View  Result Date: 04/08/2020 CLINICAL DATA:  Pneumonia.  Shortness of breath.  Dysphagia. EXAM: PORTABLE CHEST 1 VIEW COMPARISON:  April 06, 2020 FINDINGS: Effusion and opacity in left base is similar in the interval. Subtle patchy opacities in the right upper and lower lung. Stable cardiomegaly. No other interval changes. IMPRESSION: 1. Effusion with underlying opacity in left base is similar in the interval. 2. Suspected subtle infiltrates in the right upper and lower lobes, not seen previously. Recommend attention on follow-up. Electronically Signed   By: Dorise Bullion III M.D   On: 04/08/2020 12:44     Medications:   . ceFEPime (MAXIPIME) IV 2 g (04/09/20 0550)  . Immune Globulin 10% Stopped (04/09/20 0519)  . vancomycin Stopped (04/09/20 0150)   .  DULoxetine  30 mg Oral Daily  . ipratropium-albuterol  3 mL Nebulization TID  . levothyroxine  75 mcg Oral QAC breakfast  . methotrexate  10 mg Oral Weekly  . pantoprazole  40 mg Oral Daily  . polyethylene glycol  17 g Oral Daily  . pyridostigmine  60 mg Oral TID  . risperiDONE  1 mg Oral QHS  . senna-docusate  2 tablet Oral BID   acetaminophen **OR** acetaminophen, guaiFENesin-dextromethorphan  Assessment/ Plan:  70 y.o. female with medical problems of obstructive sleep apnea, seizure disorder, GERD, hyperlipidemia, hypertension, hypothyroidism, myasthenia gravis treated with methotrexate and Mestinon was admitted on 04/06/2020 for  Principal Problem:   CAP (community acquired pneumonia) Active Problems:   Hypertension   Myasthenia gravis ([place])   MG with exacerbation (myasthenia gravis) ([place])   Hypothyroidism   Shortness of breath   Acute hyponatremia  Swelling [R60.9] Myasthenia ([place]) [G70.00] CAP (community acquired pneumonia) [J18.9] Immunosuppression ([place]) [D84.9] Community acquired pneumonia of left lower lobe of lung [J18.9]  #. Hyponatremia -levels improved today to 130 -Improved oral intake -No need for IVF at this time Baseline sodium of 138 Differential diagnosis includes SIADH versus hypervolemic hyponatremia    LOS: 3 Colon Flattery 2/21/202210:24 [place], [place]  Patient was seen and evaluated with Colon Flattery, NP.  Plan of care was discussed with patient as well as NP.  I agree with the note as documented.

## 2020-04-09 NOTE — Evaluation (Addendum)
Objective Swallowing Evaluation: Type of Study: MBS-Modified Barium Swallow Study   Patient Details  Name: Madison Burns MRN: [id number] Date of Birth: [birth year]  Today's Date: 04/09/2020 Time: SLP Start Time (ACUTE ONLY): 68 -SLP Stop Time (ACUTE ONLY): 6962  SLP Time Calculation (min) (ACUTE ONLY): 60 min   Past Medical History:  Past Medical History:  Diagnosis Date   Cancer of kidney ([place]) 9528   Complication of anesthesia    Myasthenia gravis   GERD (gastroesophageal reflux disease)    History of seizure disorder    Hypercholesterolemia    Hypertension    no longer has it. wt loss   Hypothyroidism    Irritable bowel syndrome    Myasthenia gravis ([place]) 2011   OSA on CPAP    Skin cancer 2017   Melanoma on neck   Thymoma, malignant ([place])    Past Surgical History:  Past Surgical History:  Procedure Laterality Date   [place]  2013   COLONOSCOPY WITH PROPOFOL N/A 06/30/2017   Procedure: COLONOSCOPY WITH PROPOFOL;  Surgeon: Toledo, Benay Pike, MD;  Location: [facility] ENDOSCOPY;  Service: Gastroenterology;  Laterality: N/A;   CYSTOSCOPY W/ URETERAL STENT PLACEMENT Left 08/17/2019   Procedure: CYSTOSCOPY WITH RETROGRADE PYELOGRAM/URETERAL STENT PLACEMENT;  Surgeon: Alexis Frock, MD;  Location: [facility] ORS;  Service: Urology;  Laterality: Left;  30 MINS   ESOPHAGOGASTRODUODENOSCOPY (EGD) WITH PROPOFOL N/A 06/30/2017   Procedure: ESOPHAGOGASTRODUODENOSCOPY (EGD) WITH PROPOFOL;  Surgeon: Toledo, Benay Pike, MD;  Location: [facility] ENDOSCOPY;  Service: Gastroenterology;  Laterality: N/A;   IR RADIOLOGIST EVAL & MGMT  06/28/2019   IR RADIOLOGIST EVAL & MGMT  09/14/2019   IR RADIOLOGIST EVAL & MGMT  03/28/2020   RADIOFREQUENCY ABLATION Left 08/17/2019   Procedure: LEFT RENAL CRYO ABLATION;  Surgeon: Jacqulynn Cadet, MD;  Location: [facility] ORS;  Service: Anesthesiology;  Laterality: Left;   TONSILLECTOMY  1959    HPI: Pt is a 70 y.o. female  with past medical history of myasthenia gravis, cognitive impairment, Obesity, GERD, hyperlipidemia, GERD, hypothyroidism, irritable bowel syndrome, OSA on CPAP, history of seizure disorder, history of kidney cancer, abdominal hysterectomy in 1990, cholecystectomy in 1999. She was admitted to [facility] on 04/06/2020 with sepsis due to left basilar pneumonia after presenting from home to [facility] ED complaining of shortness of breath. Family reports that this constellation of symptoms is very similar to that with which the patient presented to [facility] in September 2021, at which time she was hospitalized for pneumonia in the setting of associated myasthenia gravis exacerbation.  CXR: "Effusion and opacity in left base is similar in the interval. Subtle  patchy opacities in the right upper and lower lung.".  Per report, pt has a Baseline of Impulsive eating behaviors d/t Cognitive Decline at Baseline which has been ongoing for years per Sister.  She also has REFLUX/GERD and is on a PPI but not always following REFLUX/GERD precautions behaviorally.    Subjective: pt awake, verbal and talkative. Cognitive decline baseline.    Assessment / Plan / Recommendation  CHL IP CLINICAL IMPRESSIONS 04/09/2020  Clinical Impression Pt appears to present w/ adequate oropharyngeal phase swallowing function w/ No overt oropharyngeal phase dysphagia per this MBSS exam today. Pt demonstrated timely pharyngeal swallowing initiation w/ all consistencies (Thin liquids VIA CUP) w/ NO laryngeal penetration or aspiration noted. Any slight-min diffuse BOT or valleculae residue remaining post initial swallow cleared w/ pt's own nonvolitional f/u, Dry swallow.  Oral phase was adequate for bolus management, mastication of solids, and timely A-P transfer; oral clearing achieved w/ all trials. Native Dentition. No overt cervical Esophageal dysmotility noted(in viewable area) though pt has  baseline dx'd REFLUX/GERD. ANY Reflux/GERD behavior can increase risk for Regurgitation and aspiration of Backflow Reflux material thus impacting the Pulmonary status. Pt fed self; boluses were monitored by SLP to limit any Impulsivity during eating/drinking. Pt was instructed on taking Small, Single sips which she did w/ min Supervision by SLP. Pt appears at Reduced risk for prandial aspiration when following general aspiration precautions including sitting upright and eating/drinking Slowly w/ all oral intake. Recommend pt feed self w/ verbal/visual cues. REFLUX/GERD precautions especially remaining Upright for ~45 mins post meals, smaller mini meals during the day vs larger meals at one time. Pt needs Supervision at meals to limit Impulsive Eating Behaviors as much as possible d/t increased risk for choking and increased REFLUX/GERD consequences.   SLP Visit Diagnosis Dysphagia, unspecified (R13.10)  Attention and concentration deficit following --  Frontal lobe and executive function deficit following --  Impact on safety and function Mild aspiration risk;Risk for inadequate nutrition/hydration; Reflux/GERD baseline; Cognitive decline baseline      CHL IP TREATMENT RECOMMENDATION 04/09/2020  Treatment Recommendations Therapy as outlined in treatment plan below     Prognosis 04/09/2020  Prognosis for Safe Diet Advancement Fair  Barriers to Reach Goals Cognitive deficits;Time post onset;Severity of deficits;Behavior  Barriers/Prognosis Comment --    CHL IP DIET RECOMMENDATION 04/09/2020  SLP Diet Recommendations Regular solids;Thin liquid - Cut meats, w/ gravies to moisten  Liquid Administration via Cup;No straw  Medication Administration Whole meds with puree  Compensations Minimize environmental distractions;Slow rate;Small sips/bites;Lingual sweep for clearance of pocketing;Multiple dry swallows after each bite/sip;Follow solids with liquid. GERD precautions.  Postural Changes Remain  semi-upright after after feeds/meals (Comment);Seated upright at 90 degrees. GERD/REFLUX precautions      CHL IP OTHER RECOMMENDATIONS 04/09/2020  Recommended Consults (No Data)  Oral Care Recommendations Oral care BID;Staff/trained caregiver to provide oral care;Oral care before and after PO  Other Recommendations (No Data)      CHL IP FOLLOW UP RECOMMENDATIONS 04/09/2020  Follow up Recommendations None      CHL IP FREQUENCY AND DURATION 04/09/2020  Speech Therapy Frequency (ACUTE ONLY) min 1 x/week  Treatment Duration 1 week           CHL IP ORAL PHASE 04/09/2020  Oral Phase WFL  Oral - Pudding Teaspoon --  Oral - Pudding Cup --  Oral - Honey Teaspoon --  Oral - Honey Cup NT  Oral - Nectar Teaspoon --  Oral - Nectar Cup 1 trial  Oral - Nectar Straw --  Oral - Thin Teaspoon --  Oral - Thin Cup 7 trials  Oral - Thin Straw --  Oral - Puree 3 trials  Oral - Mech Soft 3 trials  Oral - Regular --  Oral - Multi-Consistency --  Oral - Pill --  Oral Phase - Comment --    CHL IP PHARYNGEAL PHASE 04/09/2020  Pharyngeal Phase WFL  Pharyngeal- Pudding Teaspoon --  Pharyngeal --  Pharyngeal- Pudding Cup --  Pharyngeal --  Pharyngeal- Honey Teaspoon --  Pharyngeal --  Pharyngeal- Honey Cup NT  Pharyngeal --  Pharyngeal- Nectar Teaspoon --  Pharyngeal --  Pharyngeal- Nectar Cup 1 trial  Pharyngeal --  Pharyngeal- Nectar Straw --  Pharyngeal --  Pharyngeal- Thin Teaspoon --  Pharyngeal --  Pharyngeal- Thin Cup 7 trials  Pharyngeal --  Pharyngeal- Thin Straw --  Pharyngeal --  Pharyngeal- Puree 3 trials  Pharyngeal --  Pharyngeal- Mechanical Soft 3 trials  Pharyngeal --  Pharyngeal- Regular --  Pharyngeal --  Pharyngeal- Multi-consistency --  Pharyngeal --  Pharyngeal- Pill --  Pharyngeal --  Pharyngeal Comment --     CHL IP CERVICAL ESOPHAGEAL PHASE 04/09/2020  Cervical Esophageal Phase WFL  Pudding Teaspoon --  Pudding Cup --  Honey Teaspoon --  Honey Cup --   Nectar Teaspoon --  Nectar Cup --  Nectar Straw --  Thin Teaspoon --  Thin Cup --  Thin Straw --  Puree --  Mechanical Soft --  Regular --  Multi-consistency --  Pill --  Cervical Esophageal Comment --            Orinda Kenner, MS, CCC-SLP Speech Language Pathologist Rehab Services [phone] [facility] 04/09/2020, 6:38 PM

## 2020-04-09 NOTE — Consult Note (Signed)
CARDIOLOGY CONSULT NOTE               Patient ID: SHAKA ZECH MRN: [id number] DOB/AGE: 69-17-1952 69 y.o.  Admit date: 04/06/2020 Referring Physician Dr. Max Sane  Primary Physician Dr. Einar Pheasant  Primary Cardiologist Dr. Bartholome Bill  Reason for Consultation Atrial fibrillation with RVR  HPI: Ms. Erpelding is a 70 year old female with a past medical history significant for myasthenia gravis, cognitive impairment, and hyperlipidemia who presented to the ED on 04/06/20 for a two day history of increased shortness of breath with an associated cough and weakness.  She was admitted for acute hypoxic respiratory failure, requiring BiPAP, thought to be secondary to pneumonia. Neurology was consulted for MG exacerbation as well.  While admitted, she was noted to be in new onset atrial fibrillation with RVR which warranted cardiac consultation.    She is followed in outpatient cardiology by Dr. Bartholome Bill. Most recent echocardiogram on 10/24/19 revealed normal RV and LV systolic function with an EF estimated between 70-75% with no evidence of significant valvular disease.   04/09/20: On exam, Ms. Hodges is sitting up in bed, in no acute distress.  She admits to an episode of chest discomfort a few days ago, but currently denies any chest pain, palpitations, or heart racing.  She is somewhat of a poor historian and sister is currently not at bedside to provide further information.   Review of systems complete and found to be negative unless listed above     Past Medical History:  Diagnosis Date  . Cancer of kidney ([place]) 2021  . Complication of anesthesia    Myasthenia gravis  . GERD (gastroesophageal reflux disease)   . History of seizure disorder   . Hypercholesterolemia   . Hypertension    no longer has it. wt loss  . Hypothyroidism   . Irritable bowel syndrome   . Myasthenia gravis ([place]) 2011  . OSA on CPAP   . Skin cancer 2017   Melanoma on neck  . Thymoma, malignant [facility])      Past Surgical History:  Procedure Laterality Date  . ABDOMINAL HYSTERECTOMY  1990  . APPENDECTOMY  1999  . CHOLECYSTECTOMY  2013  . COLONOSCOPY WITH PROPOFOL N/A 06/30/2017   Procedure: COLONOSCOPY WITH PROPOFOL;  Surgeon: Toledo, Benay Pike, MD;  Location: [facility] ENDOSCOPY;  Service: Gastroenterology;  Laterality: N/A;  . CYSTOSCOPY W/ URETERAL STENT PLACEMENT Left 08/17/2019   Procedure: CYSTOSCOPY WITH RETROGRADE PYELOGRAM/URETERAL STENT PLACEMENT;  Surgeon: Alexis Frock, MD;  Location: [facility]S;  Service: Urology;  Laterality: Left;  30 MINS  . ESOPHAGOGASTRODUODENOSCOPY (EGD) WITH PROPOFOL N/A 06/30/2017   Procedure: ESOPHAGOGASTRODUODENOSCOPY (EGD) WITH PROPOFOL;  Surgeon: Toledo, Benay Pike, MD;  Location: [facility] ENDOSCOPY;  Service: Gastroenterology;  Laterality: N/A;  . IR RADIOLOGIST EVAL & MGMT  06/28/2019  . IR RADIOLOGIST EVAL & MGMT  09/14/2019  . IR RADIOLOGIST EVAL & MGMT  03/28/2020  . RADIOFREQUENCY ABLATION Left 08/17/2019   Procedure: LEFT RENAL CRYO ABLATION;  Surgeon: Jacqulynn Cadet, MD;  Location: [facility] ORS;  Service: Anesthesiology;  Laterality: Left;  . TONSILLECTOMY  1959    Medications Prior to Admission  Medication Sig Dispense Refill Last Dose  . Ascorbic Acid (VITAMIN C) 1000 MG tablet Take 1,000 mg by mouth at bedtime.   04/05/2020 at 1430  . B Complex-C (B-COMPLEX WITH VITAMIN C) tablet Take 1 tablet by mouth every evening.   04/05/2020 at 2000  . Biotin 10000 MCG TABS Take 10,000 mcg  by mouth daily at 2 PM. (1430)   04/05/2020 at 1430  . Calcium-Phosphorus-Vitamin D (CITRACAL +D3 PO) Take 1 tablet by mouth daily.   04/05/2020 at 2000  . diltiazem (CARDIZEM CD) 180 MG 24 hr capsule Take 1 capsule (180 mg total) by mouth daily. 30 capsule 2 04/06/2020 at 0830  . DULoxetine (CYMBALTA) 30 MG capsule TAKE 1 CAPSULE BY MOUTH ONCE DAILY (Patient taking differently: Take 30 mg by mouth daily.) 30 capsule 5 04/06/2020 at 0830  . folic acid (FOLVITE) 1 MG tablet 2 mg every day po  60 tablet 5 04/06/2020 at 0830  . Garlic 3154 MG CAPS Take 1,000 mg by mouth in the morning, at noon, and at bedtime. (0800, 1430, 2000)   04/06/2020 at 0830  . levothyroxine (SYNTHROID) 75 MCG tablet TAKE 1 TABLET BY MOUTH DAILY ON AN EMPTYSTOMACH. WAIT 30 MINUTES BEFORE TAKING OTHER MEDS (Patient taking differently: Take 75 mcg by mouth daily before breakfast.) 90 tablet 1 04/06/2020 at 0830  . pantoprazole (PROTONIX) 40 MG tablet Take 40 mg by mouth daily.    04/06/2020 at 0830  . Probiotic Product (PROBIOTIC MULTI-ENZYME PO) Take 1 capsule by mouth in the morning and at bedtime. Probiotic Multi-Enzyme Digestive Formula   Past Month at Unknown time  . pyridostigmine (MESTINON) 60 MG tablet TAKE 1 TABLET BY MOUTH AT  8 AM, ONE AT LUNCH AND ONE AT 8PM (Patient taking differently: Take 60 mg by mouth 3 (three) times daily. TAKE 1 TABLET BY MOUTH AT  8 AM, ONE AT LUNCH AND ONE AT 8PM) 90 tablet 6 04/06/2020 at 0830  . risperiDONE (RISPERDAL) 1 MG tablet Take 1 mg by mouth at bedtime.    04/05/2020 at 2000  . sucralfate (CARAFATE) 1 g tablet Take 1 g by mouth 2 (two) times daily. (0800 & 1700)   04/06/2020 at 0830  . tolterodine (DETROL LA) 4 MG 24 hr capsule TAKE 1 CAPSULE BY MOUTH ONCE EVERY MORNING (Patient taking differently: Take 4 mg by mouth in the morning.) 90 capsule 1 04/06/2020 at 0830  . vitamin E 180 MG (400 UNITS) capsule Take 400 Units by mouth daily at 2 PM. 1430   04/05/2020 at 1430  . methotrexate 2.5 MG tablet TAKE 4 TABLETS BY MOUTH BY MOUTH ONCE A WEEK WITH FOLIC ACID (Patient taking differently: Take 10 mg by mouth once a week. TAKE 4 TABLETS BY MOUTH BY MOUTH ONCE A WEEK WITH FOLIC ACID) 16 tablet 5 03/31/2020 at 1800  . predniSONE (DELTASONE) 10 MG tablet Take one in AM and one at lunch for breathing relief (Patient taking differently: Take 10 mg by mouth 2 (two) times daily with a meal. Take one in AM and one at lunch for breathing relief) 12 tablet 3    Social History   Socioeconomic  History  . Marital status: Single    Spouse name: Not on file  . Number of children: 0  . Years of education: Special Ed  . Highest education level: Not on file  Occupational History  . Occupation: Unemployed  Tobacco Use  . Smoking status: Never Smoker  . Smokeless tobacco: Never Used  Vaping Use  . Vaping Use: Never used  Substance and Sexual Activity  . Alcohol use: No    Alcohol/week: 0.0 standard drinks  . Drug use: No  . Sexual activity: Not Currently  Other Topics Concern  . Not on file  Social History Narrative   06/23/17 lives with sister, [name]  Regular exercise-no   Caffeine Use-yes   Social Determinants of Health   Financial Resource Strain: Not on file  Food Insecurity: Not on file  Transportation Needs: Not on file  Physical Activity: Not on file  Stress: Not on file  Social Connections: Not on file  Intimate Partner Violence: Not on file    Family History  Problem Relation Age of Onset  . Colon cancer Maternal Grandfather        stomach/colon  . Heart disease Father        myocardial infarction  . Hyperlipidemia Father   . Hyperlipidemia Sister   . Diabetes Sister   . Bone cancer Other        cousin  . Alzheimer's disease Mother        maternal aunts  . Skin cancer Mother   . Myasthenia gravis Brother        ocular  . Skin cancer Brother   . Stroke Paternal Grandfather   . Breast cancer Neg Hx       Review of systems complete and found to be negative unless listed above      PHYSICAL EXAM  General: Well developed, well nourished, in no acute distress HEENT:  Normocephalic and atramatic Neck:  No JVD.  Lungs: Clear bilaterally to auscultation and percussion. Heart: Irregularly irregular, rapid ventricular response . Normal S1 and S2 without gallops or murmurs.  Abdomen: Bowel sounds are positive, abdomen soft and non-tender  Msk:  Back normal, normal gait. Normal strength and tone for age. Extremities: No clubbing, cyanosis or edema.    Neuro: Alert and oriented X 3. Psych:  Good affect, responds appropriately  Labs:   Lab Results  Component Value Date   WBC 13.4 (H) 04/09/2020   HGB 10.2 (L) 04/09/2020   HCT 30.6 (L) 04/09/2020   MCV 92.2 04/09/2020   PLT 230 04/09/2020    Recent Labs  Lab 04/07/20 0535 04/08/20 0413 04/09/20 0456  NA 125*   < > 130*  K 4.1   < > 4.9  CL 92*   < > 99  CO2 23   < > 25  BUN 20   < > 20  CREATININE 0.78   < > 0.76  CALCIUM 8.5*   < > 8.3*  PROT 6.1*  --   --   BILITOT 0.9  --   --   ALKPHOS 58  --   --   ALT 24  --   --   AST 20  --   --   GLUCOSE 135*   < > 145*   < > = values in this interval not displayed.   No results found for: CKTOTAL, CKMB, CKMBINDEX, TROPONINI  Lab Results  Component Value Date   CHOL 197 12/14/2018   CHOL 184 08/06/2018   CHOL 193 03/30/2018   Lab Results  Component Value Date   HDL 36.80 (L) 12/14/2018   HDL 37.40 (L) 08/06/2018   HDL 35.90 (L) 03/30/2018   Lab Results  Component Value Date   LDLCALC 110 (H) 08/06/2018   LDLCALC 120 (H) 03/30/2018   LDLCALC 130 (H) 07/14/2017   Lab Results  Component Value Date   TRIG 210.0 (H) 12/14/2018   TRIG 182.0 (H) 08/06/2018   TRIG 187.0 (H) 03/30/2018   Lab Results  Component Value Date   CHOLHDL 5 12/14/2018   CHOLHDL 5 08/06/2018   CHOLHDL 5 03/30/2018   Lab Results  Component Value Date   LDLDIRECT  122.0 12/14/2018   LDLDIRECT 103.0 11/20/2017   LDLDIRECT 122.0 02/13/2016      Radiology: DG Chest 2 View  Result Date: 04/06/2020 CLINICAL DATA:  Shortness of breath.  Chest pain. EXAM: CHEST - 2 VIEW COMPARISON:  November 04, 2019. FINDINGS: Mild cardiomegaly is noted with central pulmonary vascular congestion. No pneumothorax is noted. Left basilar atelectasis is noted with small left pleural effusion. Stable elevated left hemidiaphragm. Bony thorax is unremarkable. IMPRESSION: Stable elevated left hemidiaphragm. Left basilar atelectasis with small left pleural effusion.  Electronically Signed   By: Marijo Conception M.D.   On: 04/06/2020 11:56   MR ABDOMEN WWO CONTRAST  Result Date: 03/23/2020 CLINICAL DATA:  Status post renal cryoablation EXAM: MRI ABDOMEN WITHOUT AND WITH CONTRAST TECHNIQUE: Multiplanar multisequence MR imaging of the abdomen was performed both before and after the administration of intravenous contrast. CONTRAST:  7.74mL GADAVIST GADOBUTROL 1 MMOL/ML IV SOLN COMPARISON:  CT abdomen dated 06/07/2019 FINDINGS: Lower chest: Small bilateral pleural effusions, left greater than right. Associated left lower lobe opacity, likely atelectasis. Hepatobiliary: Liver is within normal limits. Status post cholecystectomy. No intrahepatic or extrahepatic ductal dilatation. Pancreas:  Within normal limits. Spleen:  Within normal limits. Adrenals/Urinary Tract: Mild low-density thickening of the bilateral adrenal glands, left greater than right. Postprocedural changes related to left lower pole renal cryoablation. Cryoablation zone measures approximately 2.5 x 2.9 x 3.1 cm. Mild intrinsic T1 hyperintensity (series 15/image 67) reflects coagulative necrosis. No residual enhancement on postcontrast subtraction imaging to suggest viable tumor. Additional small bilateral renal cysts, including a 2.2 cm left renal sinus cyst (series 5/image 28). No enhancing renal lesions. No hydronephrosis. Stomach/Bowel: Stomach and visualized bowel are grossly unremarkable. Vascular/Lymphatic:  No evidence of abdominal aortic aneurysm. No suspicious abdominal lymphadenopathy. Other:  No abdominal ascites. Musculoskeletal: No focal osseous lesions. IMPRESSION: Postprocedural changes related to left lower pole renal cryoablation. No evidence of viable tumor. Small bilateral pleural effusions, left greater than right. Associated left lower lobe opacity, likely atelectasis. Electronically Signed   By: Julian Hy M.D.   On: 03/23/2020 11:21   US Venous Img Lower Bilateral (DVT)  Result Date:  04/07/2020 CLINICAL DATA:  Swelling. EXAM: BILATERAL LOWER EXTREMITY VENOUS DOPPLER ULTRASOUND TECHNIQUE: Gray-scale sonography with compression, as well as color and duplex ultrasound, were performed to evaluate the deep venous system(s) from the level of the common femoral vein through the popliteal and proximal calf veins. COMPARISON:  None. FINDINGS: VENOUS Normal compressibility of the common femoral, superficial femoral, and popliteal veins, as well as the visualized calf veins. Visualized portions of profunda femoral vein and great saphenous vein unremarkable. No filling defects to suggest DVT on grayscale or color Doppler imaging. Doppler waveforms show normal direction of venous flow, normal respiratory plasticity and response to augmentation. Limited views of the contralateral common femoral vein are unremarkable. OTHER None. Limitations: none IMPRESSION: Negative. Electronically Signed   By: Dorise Bullion III M.D   On: 04/07/2020 09:51   DG Chest Port 1 View  Result Date: 04/08/2020 CLINICAL DATA:  Pneumonia.  Shortness of breath.  Dysphagia. EXAM: PORTABLE CHEST 1 VIEW COMPARISON:  April 06, 2020 FINDINGS: Effusion and opacity in left base is similar in the interval. Subtle patchy opacities in the right upper and lower lung. Stable cardiomegaly. No other interval changes. IMPRESSION: 1. Effusion with underlying opacity in left base is similar in the interval. 2. Suspected subtle infiltrates in the right upper and lower lobes, not seen previously. Recommend attention on follow-up. Electronically  Signed   By: Dorise Bullion III M.D   On: 04/08/2020 12:44   IR Radiologist Eval & Mgmt  Result Date: 03/28/2020 Please refer to notes tab for details about interventional procedure. (Op Note)   EKG: Initial ECG revealing sinus tachycardia; telemetry revealing atrial fibrillation with rapid ventricular response   ASSESSMENT AND PLAN:  1.  New onset atrial fibrillation   -Rate currently  fluctuating between 115-120; in the setting of pneumonia, MG flare    -Diltiazem discontinued due to MG flare? Will resume and closely monitor for worsening symptoms   -Will defer anticoagulation at this time  The history, physical exam findings, and plan of care were all discussed with Dr. Bartholome Bill, and all decision making was made in collaboration.   Signed: Avie Arenas PA-C 04/09/2020, 1:04 PM

## 2020-04-09 NOTE — Care Management Important Message (Signed)
Important Message  Patient Details  Name: Madison Burns MRN: [id number] Date of Birth: 10/03/1950   Medicare Important Message Given:  N/A - LOS <3 / Initial given by admissions     Juliann Pulse A Johnthan Axtman 04/09/2020, 7:45 AM

## 2020-04-09 NOTE — TOC Initial Note (Signed)
Transition of Care [facility]) - Initial/Assessment Note    Patient Details  Name: Madison Burns MRN: [id number] Date of Birth: 23-Dec-1950  Transition of Care [facility]) CM/SW Contact:    Shelbie Hutching, RN Phone Number: 04/09/2020, 1:28 PM  Clinical Narrative:                 Patient admitted to the hospital with Myasthenia Gravis and pneumonia.  RNCM met with patient at the bedside.  Patient is from home with her sister, [name].  [name] is the patient's caregiver.  Patient is pretty independent at home, walks without assistance, feeds herself, she does need assistance with bathing.  Patient is currently on acute O2 [place] 2L.   RNCM left a message with [name] for return call.   TOC will follow and assist with discharge planning.   Expected Discharge Plan: [place] Barriers to Discharge: Continued Medical Work up   Patient Goals and CMS Choice   CMS Medicare.gov Compare Post Acute Care list provided to:: Patient Represenative (must comment) Choice offered to / list presented to : Sibling  Expected Discharge Plan and Services Expected Discharge Plan: [place]   Discharge Planning Services: CM Consult   Living arrangements for the past 2 months: Single Family Home                                      Prior Living Arrangements/Services Living arrangements for the past 2 months: Single Family Home Lives with:: Siblings Patient language and need for interpreter reviewed:: Yes Do you feel safe going back to the place where you live?: Yes      Need for Family Participation in Patient Care: Yes (Comment) Care giver support system in place?: Yes (comment) (sister)   Criminal Activity/Legal Involvement Pertinent to Current Situation/Hospitalization: No - Comment as needed  Activities of Daily Living Home Assistive Devices/Equipment: Shower chair with back ADL Screening (condition at time of admission) Patient's cognitive ability adequate to safely  complete daily activities?: No Is the patient deaf or have difficulty hearing?: No Does the patient have difficulty seeing, even when wearing glasses/contacts?: No Does the patient have difficulty concentrating, remembering, or making decisions?: Yes Patient able to express need for assistance with ADLs?: Yes Does the patient have difficulty dressing or bathing?: Yes Independently performs ADLs?: No Communication: Independent Dressing (OT): Independent Grooming: Needs assistance Is this a change from baseline?: Pre-admission baseline Feeding: Independent Bathing: Needs assistance Is this a change from baseline?: Pre-admission baseline Toileting: Independent In/Out Bed: Independent Walks in Home: Independent Does the patient have difficulty walking or climbing stairs?: No Weakness of Legs: None Weakness of Arms/Hands: None  Permission Sought/Granted Permission sought to share information with : Case Manager,Family Supports Permission granted to share information with : Yes, Verbal Permission Granted  Share Information with NAME: [name]     Permission granted to share info w Relationship: sister     Emotional Assessment Appearance:: Appears stated age Attitude/Demeanor/Rapport: Engaged Affect (typically observed): Accepting Orientation: : Oriented to Self,Oriented to Place,Oriented to  Time,Oriented to Situation Alcohol / Substance Use: Not Applicable Psych Involvement: No (comment)  Admission diagnosis:  Swelling [R60.9] Myasthenia ([place]) [G70.00] CAP (community acquired pneumonia) [J18.9] Immunosuppression ([place]) [D84.9] Community acquired pneumonia of left lower lobe of lung [J18.9] Patient Active Problem List   Diagnosis Date Noted  . Shortness of breath 04/07/2020  . Acute hyponatremia  04/07/2020  . CAP (community acquired pneumonia) 04/06/2020  . Myasthenia gravis with (acute) exacerbation ([place]) 11/15/2019  . Anemia due to chemotherapy for renal cell cancer treated  with erythropoietin ([place]) 11/15/2019  . Malignant thymoma ([place]) 11/15/2019  . Idiopathic profound intellectual disability 11/15/2019  . Dyspnea on exertion 11/15/2019  . Acute respiratory failure with hypoxia ([place])   . Palliative care by specialist   . Acute respiratory distress   . Pressure injury of skin 10/24/2019  . Atrial fibrillation with RVR ([place]) 10/23/2019  . Community acquired pneumonia 10/23/2019  . Acute respiratory failure with hypoxia and hypercapnia (H[facility])   . Sepsis ([place]) 10/22/2019  . Hypothyroidism   . Left renal mass 08/17/2019  . Left shoulder pain 07/18/2019  . Mediastinal mass 04/11/2019  . Renal cell cancer, left ([place]) 03/09/2019  . Thymoma 03/09/2019  . Chest pain 12/05/2018  . Fullness of breast 11/30/2018  . History of seizure disorder 01/05/2018  . Gastroesophagitis 07/02/2017  . Cough 04/25/2017  . Epilepsy, generalized, convulsive ([place]) 02/23/2017  . OSA (obstructive sleep apnea) 02/23/2017  . Mood disorder ([place]) 10/01/2016  . Pain in right toe(s) 06/17/2015  . MG (myasthenia gravis) ([place]) 06/06/2015  . OSA on CPAP 06/06/2015  . Dysuria 02/12/2015  . Goals of care, counseling/discussion 06/12/2014  . Obstructive sleep apnea syndrome 05/03/2014  . MG with exacerbation (myasthenia gravis) ([place]) 04/10/2014  . Hypersomnia with sleep apnea 04/10/2014  . Obesity (BMI 30-39.9) 01/29/2014  . Hyperglycemia 01/29/2014  . Abdominal pain 01/29/2014  . Melanoma ([place]) 01/23/2013  . Hypercholesterolemia 12/21/2011  . Hypertension 12/21/2011  . Myasthenia gravis ([place]) 12/21/2011  . Seizure disorder ([place]) 12/21/2011  . Irritable bowel 12/21/2011   PCP:  Einar Pheasant, MD Pharmacy:   [facility], [place] [address] [address] Phone: [phone] Fax: [facility] [place], [address] [place] [address] Phone: [phone] Fax:  [phone]     Social Determinants of Health (SDOH) Interventions    Readmission Risk Interventions Readmission Risk Prevention Plan 04/09/2020  Transportation Screening Complete  PCP or Specialist Appt within 3-5 Days Complete  HRI or [place] Complete  Social Work Consult for [place] Planning/Counseling Complete  Palliative Care Screening Not Applicable  Medication Review Press photographer) Complete  Some recent data might be hidden

## 2020-04-09 NOTE — Telephone Encounter (Signed)
I called sister of pt, [name].  She said that pt is in [facility] in [place] for pneumonia and she also said for MG.  Is doing ok.  I relayed message is out of the hospital. She did not give prednisone but she will contact once Janell out of hospital.  She was appreciative of call.

## 2020-04-09 NOTE — Consult Note (Signed)
Consultation Note Date: 04/09/2020   Patient Name: Madison Burns  DOB: 21-Jul-1950  MRN: [id number]  Age / Sex: 70 y.o., female  PCP: Einar Pheasant, MD Referring Physician: Max Sane, MD  Reason for Consultation: Establishing goals of care and Psychosocial/spiritual support  HPI/Patient Profile: 70 y.o. female  with past medical history of myasthenia gravis, cognitive impairment, hyperlipidemia, GERD, hypothyroidism, irritable bowel syndrome, OSA on CPAP, history of seizure disorder, history of kidney cancer, abdominal hysterectomy in 05-12-1988, cholecystectomy in 05-12-97 admitted on 04/06/2020 with myasthenia gravis and pneumonia related to aspiration.   Clinical Assessment and Goals of Care: I have reviewed medical records including EPIC notes, labs and imaging, received report from attending, examined the patient.  Ms. Madison Burns, Burns, is lying quietly in bed.  She greets me, making and mostly keeping eye contact.  She is alert, able to make her basic needs known.  I do not ask orientation questions as she has known cognitive impairment.  There is no family at bedside at this time.  Madison Burns and I talked about her family.  I asked how long she slipped with her sister and she tells me since 1998/05/13.  She shared that her mother died about 5 years ago, her father died 39 years ago.   We talked about her heart rate increased.  She tells me this is because she walked quickly in the hallway.  I shared that she will go to another room so she can get medicine.  She seems calm and accepting.  She asks if we will have a television so that she can watch wheel of fortune and jeopardy.  I reassure her that she will indeed have a television.  Call to sister/HC POA, [name] to discuss diagnosis prognosis, GOC, EOL wishes, disposition and options.   I introduced Palliative Medicine as specialized medical care for people living with  serious illness. It focuses on providing relief from the symptoms and stress of a serious illness.   We discussed a brief life review of the patient. Lived with sister [name] since 05/12/09. Mom died in 12-May-2016.   As far as functional and nutritional status, Madison Burns lives with her Ilda Basset.  She is relatively independent around the home, able to walk without assistance, feeds self.  She does need assistance with bathing.   We discussed her current illness and what it means in the larger context of her on-going co-morbidities.  Natural disease trajectory and expectations at EOL were discussed.   We talked about swallowing issues and Myasthenias gravis.   The difference between aggressive medical intervention and comfort care was considered in light of the patient's goals of care.  Ms. [name] is, in essence, treat the treatable but no CPR or intubation.  She would only accept BiPAP if needed.    Advanced directives, concepts specific to code status, artifical feeding and hydration, and rehospitalization were considered and discussed.  Family will only accept BiPAP.    Questions and concerns were addressed.  The family was encouraged to call with  questions or concerns.   Conference with attending related to patient condition, needs and GOC.   HCPOA   NEXT OF KIN - Sister [name].      SUMMARY OF RECOMMENDATIONS   Continue to treat the treatable but no CPR or intubation Would only accept BiPAP Time for outcomes Anticipate outpatient palliative to follow Unsure of need for short-term rehab   Code Status/Advance Care Planning:  Limited code - BiPAP only.   Symptom Management:   Per hospitalist, no additional needs at this time.  Palliative Prophylaxis:   Aspiration and Oral Care  Additional Recommendations (Limitations, Scope, Preferences):  Continue to treat the treatable but no CPR or intubation.  Would only accept BiPAP  Psycho-social/Spiritual:   Desire for further  Chaplaincy support:no  Additional Recommendations: Caregiving  Support/Resources and Education on Hospice  Prognosis:  Unable to determine, based on outcomes.  6 to 12 months or less would not be surprising based on chronic illness burden.  Discharge Planning: To be determined, based on outcomes.  Anticipate need for home health at least.  Outpatient palliative should follow.      Primary Diagnoses: Present on Admission: . CAP (community acquired pneumonia) . Shortness of breath . Acute hyponatremia . Hypertension . Myasthenia gravis ([place]) . MG with exacerbation (myasthenia gravis) ([place]) . Hypothyroidism   I have reviewed the medical record, interviewed the patient and family, and examined the patient. The following aspects are pertinent.  Past Medical History:  Diagnosis Date  . Cancer of kidney ([place]) 2021  . Complication of anesthesia    Myasthenia gravis  . GERD (gastroesophageal reflux disease)   . History of seizure disorder   . Hypercholesterolemia   . Hypertension    no longer has it. wt loss  . Hypothyroidism   . Irritable bowel syndrome   . Myasthenia gravis ([place]) 2011  . OSA on CPAP   . Skin cancer 2017   Melanoma on neck  . Thymoma, malignant ([place])    Social History   Socioeconomic History  . Marital status: Single    Spouse name: Not on file  . Number of children: 0  . Years of education: Special Ed  . Highest education level: Not on file  Occupational History  . Occupation: Unemployed  Tobacco Use  . Smoking status: Never Smoker  . Smokeless tobacco: Never Used  Vaping Use  . Vaping Use: Never used  Substance and Sexual Activity  . Alcohol use: No    Alcohol/week: 0.0 standard drinks  . Drug use: No  . Sexual activity: Not Currently  Other Topics Concern  . Not on file  Social History Narrative   06/23/17 lives with sister, [name]   Regular exercise-no   Caffeine Use-yes   Social Determinants of Health   Financial Resource Strain: Not  on file  Food Insecurity: Not on file  Transportation Needs: Not on file  Physical Activity: Not on file  Stress: Not on file  Social Connections: Not on file   Family History  Problem Relation Age of Onset  . Colon cancer Maternal Grandfather        stomach/colon  . Heart disease Father        myocardial infarction  . Hyperlipidemia Father   . Hyperlipidemia Sister   . Diabetes Sister   . Bone cancer Other        cousin  . Alzheimer's disease Mother        maternal aunts  . Skin cancer Mother   .  Myasthenia gravis Brother        ocular  . Skin cancer Brother   . Stroke Paternal Grandfather   . Breast cancer Neg Hx    Scheduled Meds: . amiodarone  150 mg Intravenous Once  . DULoxetine  30 mg Oral Daily  . ipratropium-albuterol  3 mL Nebulization TID  . levothyroxine  75 mcg Oral QAC breakfast  . methotrexate  10 mg Oral Weekly  . pantoprazole  40 mg Oral Daily  . polyethylene glycol  17 g Oral Daily  . pyridostigmine  60 mg Oral TID  . risperiDONE  1 mg Oral QHS  . senna-docusate  2 tablet Oral BID   Continuous Infusions: . amiodarone     Followed by  . amiodarone    . ceFEPime (MAXIPIME) IV 2 g (04/09/20 1315)  . Immune Globulin 10% Stopped (04/09/20 0519)   PRN Meds:.acetaminophen **OR** acetaminophen, guaiFENesin-dextromethorphan Medications Prior to Admission:  Prior to Admission medications   Medication Sig Start Date End Date Taking? Authorizing Provider  Ascorbic Acid (VITAMIN C) 1000 MG tablet Take 1,000 mg by mouth at bedtime.   Yes [provider]  B Complex-C (B-COMPLEX WITH VITAMIN C) tablet Take 1 tablet by mouth every evening.   Yes [provider]  Biotin 10000 MCG TABS Take 10,000 mcg by mouth daily at 2 PM. (1430)   Yes [provider]  Calcium-Phosphorus-Vitamin D (CITRACAL +D3 PO) Take 1 tablet by mouth daily.   Yes [provider]  diltiazem (CARDIZEM CD) 180 MG 24 hr capsule Take 1 capsule (180 mg total) by  mouth daily. 10/29/19  Yes Nicole Kindred A, DO  DULoxetine (CYMBALTA) 30 MG capsule TAKE 1 CAPSULE BY MOUTH ONCE DAILY Patient taking differently: Take 30 mg by mouth daily. 03/09/20  Yes Earlie Server, MD  folic acid (FOLVITE) 1 MG tablet 2 mg every day po 11/15/19  Yes Dohmeier, Asencion Partridge, MD  Garlic 5885 MG CAPS Take 1,000 mg by mouth in the morning, at noon, and at bedtime. (0800, 1430, 2000)   Yes [provider]  levothyroxine (SYNTHROID) 75 MCG tablet TAKE 1 TABLET BY MOUTH DAILY ON AN [place]. WAIT [place] OTHER MEDS Patient taking differently: Take 75 mcg by mouth daily before breakfast. 02/27/20  Yes Einar Pheasant, MD  pantoprazole (PROTONIX) 40 MG tablet Take 40 mg by mouth daily.  11/24/17  Yes [provider]  Probiotic Product (PROBIOTIC MULTI-ENZYME PO) Take 1 capsule by mouth in the morning and at bedtime. Probiotic Multi-Enzyme Digestive Formula   Yes [provider]  pyridostigmine (MESTINON) 60 MG tablet TAKE 1 TABLET BY MOUTH AT  8 AM, ONE AT LUNCH AND ONE AT 8PM Patient taking differently: Take 60 mg by mouth 3 (three) times daily. TAKE 1 TABLET BY MOUTH AT  8 AM, ONE AT LUNCH AND ONE AT 8PM 11/15/19  Yes Dohmeier, Asencion Partridge, MD  risperiDONE (RISPERDAL) 1 MG tablet Take 1 mg by mouth at bedtime.  07/08/18  Yes [provider]  sucralfate (CARAFATE) 1 g tablet Take 1 g by mouth 2 (two) times daily. (0800 & 1700) 05/25/17  Yes [provider]  tolterodine (DETROL LA) 4 MG 24 hr capsule TAKE 1 CAPSULE BY MOUTH ONCE EVERY MORNING Patient taking differently: Take 4 mg by mouth in the morning. 01/16/20  Yes Einar Pheasant, MD  vitamin E 180 MG (400 UNITS) capsule Take 400 Units by mouth daily at 2 PM. 1430   Yes [provider]  methotrexate 2.5 MG tablet TAKE 4 TABLETS BY MOUTH BY MOUTH ONCE A WEEK WITH FOLIC ACID Patient taking differently: Take 10 mg by mouth once a week. TAKE 4 TABLETS BY MOUTH BY MOUTH ONCE A WEEK WITH  FOLIC ACID 0/62/69   Lomax, Amy, NP  predniSONE (DELTASONE) 10 MG tablet Take one in AM and one at lunch for breathing relief Patient taking differently: Take 10 mg by mouth 2 (two) times daily with a meal. Take one in AM and one at lunch for breathing relief 04/06/20   Dohmeier, Asencion Partridge, MD   No Known Allergies Review of Systems  Unable to perform ROS: Other    Physical Exam Vitals and nursing note reviewed.     Vital Signs: BP 112/70 (BP Location: Left Arm)   Pulse (!) 110   Temp 97.9 F (36.6 C)   Resp 20   Ht 5\' 3"  (1.6 m)   Wt 89.8 kg   SpO2 100%   BMI 35.07 kg/m  Pain Scale: 0-10   Pain Score: 0-No pain   SpO2: SpO2: 100 % O2 Device:SpO2: 100 % O2 Flow Rate: .O2 Flow Rate (L/min): 3 L/min  IO: Intake/output summary:   Intake/Output Summary (Last 24 hours) at 04/09/2020 1559 Last data filed at 04/09/2020 1412 [place] per 24 hour  Intake 840 ml  Output 1000 ml  Net -160 ml    LBM: Last BM Date: 04/07/20 Baseline Weight: Weight: 89.8 kg Most recent weight: Weight: 89.8 kg     Palliative Assessment/Data:   Flowsheet Rows   Flowsheet Row Most Recent Value  Intake Tab   Referral Department Hospitalist  Unit at Time of Referral Med/Surg Unit  Palliative Care Primary Diagnosis Cancer  Date Notified 04/08/20  Palliative Care Type New Palliative care  Reason for referral Clarify Goals of Care  Date of Admission 04/06/20  Date first seen by Palliative Care 04/09/20  # of days Palliative referral response time 1 Day(s)  # of days IP prior to Palliative referral 2  Clinical Assessment   Palliative Performance Scale Score 30%  Pain Max last 24 hours Not able to report  Pain Min Last 24 hours Not able to report  Dyspnea Max Last 24 Hours Not able to report  Dyspnea Min Last 24 hours Not able to report  Psychosocial & Spiritual Assessment   Palliative Care Outcomes       Time In: 1550 Time Out: 1640 Time Total: 50 minutes Greater than 50%  of this time was  spent counseling and coordinating care related to the above assessment and plan.  Signed by: Drue Novel, NP   Please contact Palliative Medicine Team phone at [phone] for questions and concerns.  For individual provider: See Shea Evans

## 2020-04-10 LAB — CBC
HCT: 32.7 % — ABNORMAL LOW (ref 36.0–46.0)
Hemoglobin: 10.5 g/dL — ABNORMAL LOW (ref 12.0–15.0)
MCH: 30.7 pg (ref 26.0–34.0)
MCHC: 32.1 g/dL (ref 30.0–36.0)
MCV: 95.6 fL (ref 80.0–100.0)
Platelets: 252 10*3/uL (ref 150–400)
RBC: 3.42 MIL/uL — ABNORMAL LOW (ref 3.87–5.11)
RDW: 15.4 % (ref 11.5–15.5)
WBC: 9.6 10*3/uL (ref 4.0–10.5)
nRBC: 0 % (ref 0.0–0.2)

## 2020-04-10 LAB — BASIC METABOLIC PANEL
Anion gap: 5 (ref 5–15)
BUN: 21 mg/dL (ref 8–23)
CO2: 27 mmol/L (ref 22–32)
Calcium: 8.2 mg/dL — ABNORMAL LOW (ref 8.9–10.3)
Chloride: 103 mmol/L (ref 98–111)
Creatinine, Ser: 0.78 mg/dL (ref 0.44–1.00)
GFR, Estimated: >60 mL/min (ref >60)
Glucose, Bld: 106 mg/dL — ABNORMAL HIGH (ref 70–99)
Potassium: 4.4 mmol/L (ref 3.5–5.1)
Sodium: 135 mmol/L (ref 135–145)

## 2020-04-10 MED ORDER — POLYETHYLENE GLYCOL 3350 17 G PO PACK: 17.0000 g | PACK | Freq: Every day | ORAL | Status: DC | PRN

## 2020-04-10 MED ORDER — SENNOSIDES-DOCUSATE SODIUM 8.6-50 MG PO TABS: 2.0000 | ORAL_TABLET | Freq: Every evening | ORAL | Status: DC | PRN

## 2020-04-10 NOTE — Evaluation (Signed)
Physical Therapy Evaluation Patient Details Name: Madison Burns MRN: [id number] DOB: 17-Jan-1951 Today's Date: 04/10/2020   History of Present Illness  Pt is a 70 y.o. female  that was admitted 04/06/2020 with sepsis due to left basilar pneumonia after presenting from home complaining of shortness of breath. Pt also noteded to be in afib with RVR this admission. PMH of of myasthenia gravis, cognitive impairment, hyperlipidemia, GERD, hypothyroidism, irritable bowel syndrome, OSA on CPAP, history of seizure disorder, history of kidney cancer, abdominal hysterectomy in 1990, cholecystectomy in 1999.    Clinical Impression  Pt alert, family at bedside confirmed pt is at baseline cognitive functioning. CNA assisting with clean up and gown change at start of session. Normal sitting balance noted and pt able to transfer modI as well. Per family at baseline pt is independent for ambulation (but does need cueing for activity pacing). No falls to report. Bed mobility and transfers modI, pt able to ambulate without IV pole, but seemed to prefer its support for 53ft, CGA. Pt with very fast paced ambulation; cued for pacing of activity due to elevated RR. Pt also became very anxious with sister out of site, returned to room due to HR in 120s and pt anxiety. Redirected as able for anxiety management.  Overall the patient demonstrated mild deficits (see "PT Problem List") that impede the patient's functional abilities, safety, and mobility and would benefit from skilled PT intervention. Recommendation is HHPT with 24/7 supervision/assistance.  Of note pt trialed on room air, spO2 >93% at rest, with ambulation and anxiety decreased to 88-89%. Returned to 3L at end of session.      Follow Up Recommendations Home health PT;Supervision/Assistance - 24 hour    Equipment Recommendations  None recommended by PT    Recommendations for Other Services       Precautions / Restrictions Precautions Precautions:  Fall Restrictions Weight Bearing Restrictions: No      Mobility  Bed Mobility Overal bed mobility: Modified Independent                  Transfers Overall transfer level: Modified independent Equipment used: None                Ambulation/Gait Ambulation/Gait assistance: Min guard   Assistive device: IV Pole       General Gait Details: Pt with very fast paced ambulation; cued for pacing of activity due to elevated RR. Pt also became very anxious with sister out of site, returned to room due to HR in 120s and pt anxiety.  Stairs            Wheelchair Mobility    Modified Rankin (Stroke Patients Only)       Balance Overall balance assessment: Needs assistance Sitting-balance support: Feet supported Sitting balance-Leahy Scale: Normal     Standing balance support: Single extremity supported Standing balance-Leahy Scale: Good Standing balance comment: pt ambulated with IV pole for preference, not necessarily required                             Pertinent Vitals/Pain Pain Assessment: No/denies pain    Home Living Family/patient expects to be discharged to:: Private residence Living Arrangements: Other relatives (sister) Available Help at Discharge: Family;Available 24 hours/day Type of Home: House Home Access: Level entry     Home Layout: One level Home Equipment: Shower seat;Grab bars - tub/shower;Hospital bed      Prior Function Level of Independence: Needs  assistance   Gait / Transfers Assistance Needed: Pt reports no AD use with ambulation at home  ADL's / Homemaking Assistance Needed: sister assist with bathing, but pt able to eat but needs supervision        Hand Dominance        Extremity/Trunk Assessment   Upper Extremity Assessment Upper Extremity Assessment: Difficult to assess due to impaired cognition    Lower Extremity Assessment Lower Extremity Assessment: Difficult to assess due to impaired  cognition    Cervical / Trunk Assessment Cervical / Trunk Assessment: Normal  Communication      Cognition Arousal/Alertness: Awake/alert Behavior During Therapy: WFL for tasks assessed/performed Overall Cognitive Status: History of cognitive impairments - at baseline                                 General Comments: sister at bedside confirms pt is at baseline cognition      General Comments      Exercises Other Exercises Other Exercises: Pt seated with CNA at bedside for cleaning up and gown changed. normal balance noted, able to stand with supervision and no AD   Assessment/Plan    PT Assessment Patient needs continued PT services  PT Problem List Decreased strength;Decreased activity tolerance;Decreased mobility       PT Treatment Interventions Balance training;DME instruction;Gait training;Neuromuscular re-education;Stair training;Functional mobility training;Patient/family education;Therapeutic activities;Therapeutic exercise    PT Goals (Current goals can be found in the Care Plan section)  Acute Rehab PT Goals Patient Stated Goal: to go home PT Goal Formulation: With family Time For Goal Achievement: 04/24/20 Potential to Achieve Goals: Good    Frequency Min 2X/week   Barriers to discharge        Co-evaluation               AM-PAC PT "6 Clicks" Mobility  Outcome Measure Help needed turning from your back to your side while in a flat bed without using bedrails?: None Help needed moving from lying on your back to sitting on the side of a flat bed without using bedrails?: None Help needed moving to and from a bed to a chair (including a wheelchair)?: None Help needed standing up from a chair using your arms (e.g., wheelchair or bedside chair)?: None Help needed to walk in hospital room?: A Little Help needed climbing 3-5 steps with a railing? : A Little 6 Click Score: 22    End of Session Equipment Utilized During Treatment: Gait  belt Activity Tolerance: Patient tolerated treatment well;Other (comment) (limited by anxiety) Patient left: in bed;with call bell/phone within reach;with bed alarm set Nurse Communication: Mobility status PT Visit Diagnosis: Other abnormalities of gait and mobility (R26.89)    Time: 5625-6389 PT Time Calculation (min) (ACUTE ONLY): 22 min   Charges:   PT Evaluation $PT Eval Low Complexity: 1 Low PT Treatments $Therapeutic Exercise: 8-22 mins       Lieutenant Diego PT, DPT 11:32 AM,04/10/20

## 2020-04-10 NOTE — TOC Progression Note (Signed)
Transition of Care [facility]) - Progression Note    Patient Details  Name: Madison Burns MRN: [id number] Date of Birth: 12-Dec-1950  Transition of Care [facility]) CM/SW Contact  Shelbie Hutching, RN Phone Number: 04/10/2020, 1:51 PM  Clinical Narrative:    RNCM met with patient's sister, [name] at the bedside this morning.  Patient lives with [name] and [name] is her caregiver.  They would like to have home health services at discharge and have used [facility] in the past.  They would like to have [facility] again.  [place] with [facility] accepted referral for [facility] RN, PT, OT, and SW.  RNCM also has made a referral to [facility] with [facility] for OP Palliative services and placed a consult for [facility] Case Management at discharge.     Expected Discharge Plan: [place] Barriers to Discharge: Continued Medical Work up  Expected Discharge Plan and Services Expected Discharge Plan: [place]   Discharge Planning Services: CM Consult Post Acute Care Choice: Center arrangements for the past 2 months: Single Family Home                 DME Arranged: N/A DME Agency: NA       HH Arranged: RN,PT,OT,Social Work [employer] Agency: [facility] Date [place]: 04/10/20 Time [place]: 1350 Representative spoke with at [place]: [place] ([place]) Interventions    Readmission Risk Interventions Readmission Risk Prevention Plan 04/09/2020  Transportation Screening Complete  PCP or Specialist Appt within 3-5 Days Complete  HRI or [place] Complete  Social Work Consult for [place] Planning/Counseling Complete  Palliative Care Screening Not Applicable  Medication Review [employer]) Complete  Some recent data might be hidden

## 2020-04-10 NOTE — Progress Notes (Addendum)
[facility] NAME: Madison Burns    MR#:  [id number]  DATE OF BIRTH:  1950/03/27  SUBJECTIVE:  CHIEF COMPLAINT:   Chief Complaint  Patient presents with  . Shortness of Breath  . Dysphagia  Sitting at edge of bed eating breakfast.  Heart rate in much better control with oral amiodarone.  Shortness of breath improving.  Sister at bedside.  Patient had bowel movement per sister REVIEW OF SYSTEMS:  Review of Systems  Constitutional: Positive for malaise/fatigue. Negative for diaphoresis, fever and weight loss.  HENT: Negative for ear discharge, ear pain, hearing loss, nosebleeds, sore throat and tinnitus.   Eyes: Negative for blurred vision and pain.  Respiratory: Positive for cough and shortness of breath. Negative for hemoptysis.   Cardiovascular: Negative for chest pain, palpitations, orthopnea and leg swelling.  Gastrointestinal: Negative for abdominal pain, blood in stool, diarrhea, heartburn, nausea and vomiting.  Genitourinary: Negative for dysuria, frequency and urgency.  Musculoskeletal: Negative for back pain and myalgias.  Skin: Negative for itching and rash.  Neurological: Negative for dizziness, tingling, tremors, focal weakness, seizures, weakness and headaches.  Psychiatric/Behavioral: Negative for depression. The patient is not nervous/anxious.    DRUG ALLERGIES:  No Known Allergies VITALS:  Blood pressure 132/76, pulse (!) 101, temperature 98.7 F (37.1 C), resp. rate 14, height 5\' 3"  (1.6 m), weight 89.8 kg, SpO2 100 %. PHYSICAL EXAMINATION:  Physical Exam HENT:     Head: Normocephalic and atraumatic.  Eyes:     Conjunctiva/sclera: Conjunctivae normal.     Pupils: Pupils are equal, round, and reactive to light.  Neck:     Thyroid: No thyromegaly.     Trachea: No tracheal deviation.  Cardiovascular:     Rate and Rhythm: Normal rate and regular rhythm.     Heart sounds: Normal heart sounds.  Pulmonary:     Effort:  No respiratory distress.     Breath sounds: Examination of the right-lower field reveals decreased breath sounds, wheezing and rhonchi. Examination of the left-lower field reveals decreased breath sounds, wheezing and rhonchi. Decreased breath sounds, wheezing and rhonchi present.  Chest:     Chest wall: No tenderness.  Abdominal:     General: Bowel sounds are normal. There is distension.     Palpations: Abdomen is soft.     Tenderness: There is no abdominal tenderness.  Musculoskeletal:        General: Normal range of motion.     Cervical back: Normal range of motion and neck supple.  Skin:    General: Skin is warm and dry.     Findings: No rash.  Neurological:     Mental Status: She is alert and oriented to person, place, and time.     Cranial Nerves: No cranial nerve deficit.     Comments: Seem to have some developmental delay and slow responses    LABORATORY PANEL:  Female CBC Recent Labs  Lab 04/10/20 0422  WBC 9.6  HGB 10.5*  HCT 32.7*  PLT 252   ------------------------------------------------------------------------------------------------------------------ Chemistries  Recent Labs  Lab 04/07/20 0535 04/08/20 0413 04/10/20 0422  NA 125*   < > 135  K 4.1   < > 4.4  CL 92*   < > 103  CO2 23   < > 27  GLUCOSE 135*   < > 106*  BUN 20   < > 21  CREATININE 0.78   < > 0.78  CALCIUM 8.5*   < > 8.2*  AST 20  --   --   ALT 24  --   --   ALKPHOS 58  --   --   BILITOT 0.9  --   --    < > = values in this interval not displayed.   RADIOLOGY:  No results found. ASSESSMENT AND PLAN:  70 y.o. female with medical history significant for myasthenia gravis, cognitive impairment, acquired hypothyroidism, obstructive sleep apnea compliant on home nocturnal CPAP, hypertension, hyperlipidemia admitted for sepsis due to left basilar pneumonia after presenting from home to [facility] ED complaining of shortness of breath.   2/18 admitted for acute hypoxic respiratory failure thought  to be due to pneumonia, hyponatremia and myasthenia flare 2/19 requiring BiPAP initially and later weaned off to nasal cannula 2/20 had choking episode while eating breakfast followed by constant coughing and audible wheezing requiring IV steroid and DuoNeb.  Sodium continues to drop at 124.  Nephrology consult requested 2/21 remains in rapid A. fib the heart rate in 110s to 130s.  Cardiology consult.  Sodium improved at 130. 2/22 heart rate in much better control with oral amiodarone.  Palliative care evaluation  Sepsis ruled out -normal lactic acid  Acute hypoxic respiratory failure present on admission and requiring BiPAP thought to be due to Community-acquired pneumonia Procalcitonin normal but patient's sister feels very strongly about continuing antibiotics so will do so for now as patient does have ongoing respiratory symptoms.  Repeat chest x-ray from yesterday does show infiltrate on the right side so antibiotics continued. Continue cefepime for now for 2 more days to finish total 7 days course.  Discontinue vancomycin as MRSA screen is negative Continue 3 L nasal cannula oxygen.  BiPAP/CPAP as needed Patient is at high risk for aspiration especially when she does impulsive eating when she chokes and aspirates.  Speech therapy has evaluated and counseled patient about this  New onset atrial fibrillation with RVR Heart rate much better controlled with oral amiodarone Appreciate cardiology input  Myasthenia gravis exacerbation Continue IVIG.  every 6 hours NIF and vital capacity checks, Mestinon 60 mg p.o. 3 times daily  NIF - 18 this morning  Hyponatremia Sodium 138-> 126-> 125-> 124->130->135 Fluid restriction.  Nephrology following  Hypothyroidism Normal TSH, continue Synthroid 75 mcg once daily  Lower extremity pain -> resolved DVT ruled out by venous Dopplers  Obesity Body mass index is 35.07 kg/m.  Net IO Since Admission: -1,597.75 mL [04/10/20 1437]    palliative  care consultation pending for goals of care.  Overall poor prognosis and high risk for recurrent aspiration.  Sister would like to keep her home and agreeable to have follow palliative care at home  Status is: Inpatient  Remains inpatient appropriate because:IV treatments appropriate due to intensity of illness or inability to take PO   Dispo: The patient is from: Home              Anticipated d/c is to: Home with home health and palliative care to follow              Anticipated d/c date is: 2 days              Patient currently is not medically stable to d/c.  Still has rapid A. fib but rate getting better control   Difficult to place patient No   DVT prophylaxis:       SCDs Start: 04/06/20 1635     Family Communication: Sister  updated at bedside on 2/22   All the records are reviewed and case discussed with Care Management/Social Worker. Management plans discussed with the patient, family/sister and they are in agreement.  CODE STATUS: Partial Code Level of care: Med-Surg  TOTAL TIME TAKING CARE OF THIS PATIENT: 35 minutes.   More than 50% of the time was spent in counseling/coordination of care: YES  POSSIBLE D/C IN 2-3 DAYS, DEPENDING ON CLINICAL CONDITION.   Max Sane M.D on 04/10/2020 at 2:37 PM  [facility]   CC: Primary care physician; Einar Pheasant, MD  Note: This dictation was prepared with Dragon dictation along with smaller phrase technology. Any transcriptional errors that result from this process are unintentional.

## 2020-04-10 NOTE — Evaluation (Signed)
Occupational Therapy Evaluation Patient Details Name: Madison Burns MRN: [id number] DOB: 1950-05-21 Today's Date: 04/10/2020    History of Present Illness Pt is a 70 y.o. female  that was admitted 04/06/2020 with sepsis due to left basilar pneumonia after presenting from home complaining of shortness of breath. Pt also noteded to be in afib with RVR this admission. PMH of of myasthenia gravis, cognitive impairment, hyperlipidemia, GERD, hypothyroidism, irritable bowel syndrome, OSA on CPAP, history of seizure disorder, history of kidney cancer, abdominal hysterectomy in 1990, cholecystectomy in 1999.   Clinical Impression   Patient presenting with decreased I in self care, balance, functional mobility, transfers, and endurance. Pt's caregiver, [name] (sister), present to confirm baseline. Pt ambulates without use of AD and sister assists with shower for safety. Pt dresses self independently and sister assists on occasional with hygiene after toileting needs. This session pt getting IV medications and sister requesting pt be seen from bed level. Sister has been present during PT session and functional transfer to toilet with her reporting pt is close to baseline. She feels confident that she can provide this level of assist at home. OT educated pt and caregiver on energy conservation related to self care tasks for home. Patient will benefit from acute OT to increase overall independence in the areas of ADLs, functional mobility, and safety awareness in order to safely discharge home with sister.    Follow Up Recommendations  Home health OT;Supervision/Assistance - 24 hour    Equipment Recommendations  None recommended by OT       Precautions / Restrictions Precautions Precautions: Fall      Mobility    Transfers                 General transfer comment: sister declined for pt this session    Balance                                           ADL either  performed or assessed with clinical judgement   ADL                                         General ADL Comments: sister present in evaluation and reports she has been present for toileting, transfers, and ambulation and reports pt is "close" to her baseline. Sister declined OOB activities with therapy this session secondary to IV running and pt having "bad veins and they blow out easily". Sister is primary caregiver and feels confident she can assist pt at home.     Vision Patient Visual Report: No change from baseline              Pertinent Vitals/Pain Pain Assessment: Faces Faces Pain Scale: No hurt     Hand Dominance Right   Extremity/Trunk Assessment Upper Extremity Assessment Upper Extremity Assessment: Overall WFL for tasks assessed;Generalized weakness   Lower Extremity Assessment Lower Extremity Assessment: Defer to PT evaluation   Cervical / Trunk Assessment Cervical / Trunk Assessment: Normal   Communication Communication Communication: No difficulties   Cognition Arousal/Alertness: Awake/alert Behavior During Therapy: WFL for tasks assessed/performed Overall Cognitive Status: History of cognitive impairments - at baseline  General Comments: sister at bedside confirms pt is at baseline cognition              [place] expects to be discharged to:: Private residence Living Arrangements: Other relatives Available Help at Discharge: Family;Available 24 hours/day Type of Home: House Home Access: Level entry     Home Layout: One level     Bathroom Shower/Tub: Occupational psychologist: Handicapped height     Home Equipment: Shower seat;Grab bars - tub/shower;Hospital bed          Prior Functioning/Environment Level of Independence: Needs assistance  Gait / Transfers Assistance Needed: Pt reports no AD use with ambulation at home ADL's / Homemaking Assistance  Needed: sister assist with bathing. Pt dresses herself, feeds self, and only needs assist with hygiene occasionally with toileting.            OT Problem List: Decreased strength;Impaired balance (sitting and/or standing);Decreased cognition;Decreased knowledge of precautions;Impaired tone;Decreased safety awareness;Decreased activity tolerance;Decreased coordination;Decreased knowledge of use of DME or AE      OT Treatment/Interventions: Self-care/ADL training;Manual therapy;Therapeutic exercise;Patient/family education;Neuromuscular education;Energy conservation;Therapeutic activities;Balance training    OT Goals(Current goals can be found in the care plan section) Acute Rehab OT Goals Patient Stated Goal: to go home OT Goal Formulation: With patient/family Time For Goal Achievement: 04/24/20 Potential to Achieve Goals: Fair ADL Goals Pt Will Perform Grooming: with modified independence;standing Pt Will Perform Lower Body Dressing: with modified independence;sit to/from stand Pt Will Transfer to Toilet: with modified independence;ambulating Pt Will Perform Toileting - Clothing Manipulation and hygiene: with modified independence;sit to/from stand  OT Frequency: Min 2X/week   Barriers to D/C:    none known at this time          AM-PAC OT "6 Clicks" Daily Activity     Outcome Measure Help from another person eating meals?: A Little Help from another person taking care of personal grooming?: A Little Help from another person toileting, which includes using toliet, bedpan, or urinal?: A Little Help from another person bathing (including washing, rinsing, drying)?: A Little Help from another person to put on and taking off regular upper body clothing?: A Little Help from another person to put on and taking off regular lower body clothing?: A Little 6 Click Score: 18   End of Session Nurse Communication: Mobility status  Activity Tolerance: Other (comment) (limited by  IV) Patient left: in bed;with nursing/sitter in room;with call bell/phone within reach;with bed alarm set  OT Visit Diagnosis: Muscle weakness (generalized) (M62.81);Unsteadiness on feet (R26.81)                Time: 1410-1431 OT Time Calculation (min): 21 min Charges:  OT General Charges $OT Visit: 1 Visit OT Evaluation $OT Eval Low Complexity: 1 Low OT Treatments $Self Care/Home Management : 8-22 mins  Darleen Crocker, MS, OTR/L , CBIS ascom [phone]  04/10/20, 3:57 PM

## 2020-04-10 NOTE — Progress Notes (Signed)
Palliative: Madison Burns, Madison Burns, is sitting up on the edge of the bed eating her breakfast. Her sister/caregiver, Madison Burns, is at bedside. Present today is attending, Dr. Manuella Ghazi. Madison Burns looks somewhat improved since yesterday. She will briefly make but not keep eye contact, is able to feed herself, although somewhat impulsively. She is able to make her basic needs known. Madison Burns states she is planning on staying most of the day.  We talked about the treatment plan including but not limited to amiodarone by mouth for heart rate/rhythm control, IV antibiotics for aspiration pneumonia for 2 more days, recurrent aspiration last episode on Sunday, speech therapy recommendations, PT evaluation pending, myasthenia gravis treatment.   We also talked about psychosocial issues and home life for [place]. It is clear that Madison Burns is an excellent caregiver for her sister. Madison Burns states that she has both durable and healthcare power of attorney for [employer]. Their brothers, who live out of town, are also listed along with Nancy's daughter. Madison Burns shares that she has 15 brain tumors, and has help from her cousin and, at times, her daughter.  We talked about at home services. Madison Burns feels that Madison Burns will be best served with at home services, the medical team agrees. Madison Burns is agreeable to home health services. We talked about the benefits of PT and ST for further help and support. Outpatient palliative to follow. Madison Burns and Madison Burns would also likely qualify from participation in the PACE program, but I am not sure about admission criteria. This discussed with [facility] team  Conference with attending, transition of care team, speech therapy related to patient condition, needs, goals of care, disposition, outpatient services.  Plan: Continue to treat the treatable, no extraordinary measures, would accept use of BiPAP. Home with home health services including PT, OT, ST. Outpatient palliative services to follow. Would benefit from PACE program if  eligible.  70 minutes Quinn Axe, NP Palliative medicine team Team phone [phone] Greater than 50% of this time was spent counseling and coordinating care related to the above assessment and plan

## 2020-04-10 NOTE — Progress Notes (Signed)
[place], [place] 04/10/20  Subjective:   Madison Burns is a 70 y.o. femail with PMHX of cognitive impairment, Myasthenia Gravis, acquired hypothryroidism, obstructive sleep apnea with CPAP, HTN, and Hyperlipidemia. She presents to ED with shortness of breath.   She is admitted with left basilar pneumonia resulting in sepsis.  She has been placed on antibiotics.  During her admission, sodium levels decreased to 124.   Patient is seen today sitting in chair. Alert and appropriately oriented Denies shortness of breath and chest pain Able to eat meals Voiding on bedside commode  UOP 1.7L  Objective:  Vital signs in last 24 hours:  Temp:  [97.5 F (36.4 C)-98.9 F (37.2 C)] 98.7 F (37.1 C) (02/22 1153) Pulse Rate:  [93-113] 101 (02/22 1153) Resp:  [14-21] 14 (02/22 1153) BP: (112-132)/(58-84) 132/76 (02/22 1153) SpO2:  [95 %-100 %] 100 % (02/22 1153) Weight:  [89.8 kg] 89.8 kg (02/22 0500)  Weight change:  Filed Weights   04/07/20 1635 04/10/20 0500  Weight: 89.8 kg 89.8 kg    Intake/Output:    Intake/Output Summary (Last 24 hours) at 04/10/2020 1215 Last data filed at 04/10/2020 1660 [place] per 24 hour  Intake 480 ml  Output 1700 ml  Net -1220 ml    Physical Exam: General:  NAD, sitting in chair  HEENT  anicteric, moist oral mucous membranes  Neck:  Supple, no JVD, no masses  Lungs:  Clear  Heart::  Regular, no rub  Abdomen:  Soft, nontender  Extremities:  No edema  Neurologic:  Alert, oriented  Skin:  No acute rashes    Basic Metabolic Panel:  Recent Labs  Lab 04/06/20 1124 04/07/20 0535 04/08/20 0413 04/09/20 0456 04/10/20 0422  NA 126* 125* 124* 130* 135  K 4.1 4.1 4.1 4.9 4.4  CL 92* 92* 94* 99 103  CO2 25 23 22 25 27   GLUCOSE 153* 135* 143* 145* 106*  BUN 21 20 21 20 21   CREATININE 0.99 0.78 0.81 0.76 0.78  CALCIUM 9.0 8.5* 8.4* 8.3* 8.2*  PHOS  --  3.7  --   --   --      CBC: Recent Labs  Lab  04/06/20 1124 04/07/20 0535 04/08/20 0413 04/09/20 0456 04/10/20 0422  WBC 17.3* 12.7* 11.9* 13.4* 9.6  NEUTROABS 15.2* 10.1*  --   --   --   HGB 11.2* 10.5* 10.8* 10.2* 10.5*  HCT 34.1* 31.9* 32.8* 30.6* 32.7*  MCV 92.4 93.3 91.9 92.2 95.6  PLT 224 212 223 230 252     No results found for: HEPBSAG, HEPBSAB, HEPBIGM    Microbiology:  Recent Results (from the past 240 hour(s))  MRSA PCR Screening     Status: None   Collection Time: 04/06/20  9:50 AM   Specimen: Nasopharyngeal  Result Value Ref Range Status   MRSA by PCR NEGATIVE NEGATIVE Final    Comment:        The GeneXpert MRSA Assay (FDA approved for NASAL specimens only), is one component of a comprehensive MRSA colonization surveillance program. It is not intended to diagnose MRSA infection nor to guide or monitor treatment for MRSA infections. Performed at [facility], [address] [place], [place] [address]   Blood culture (routine x 2)     Status: None (Preliminary result)   Collection Time: 04/06/20  1:50 PM   Specimen: BLOOD  Result Value Ref Range Status   Specimen Description BLOOD LEFT ANTECUBITAL  Final   Special Requests  Final    BOTTLES DRAWN AEROBIC AND ANAEROBIC Blood Culture results may not be optimal due to an inadequate volume of blood received in culture bottles   Culture   Final    NO GROWTH 4 DAYS Performed at [facility], [address], [place], [place] [address]    Report Status PENDING  Incomplete  Blood culture (routine x 2)     Status: None (Preliminary result)   Collection Time: 04/06/20  1:50 PM   Specimen: BLOOD  Result Value Ref Range Status   Specimen Description BLOOD RIGHT ANTECUBITAL  Final   Special Requests   Final    BOTTLES DRAWN AEROBIC AND ANAEROBIC Blood Culture results may not be optimal due to an inadequate volume of blood received in culture bottles   Culture   Final    NO GROWTH 4 DAYS Performed at [facility], [address], [place], [place] [address]    Report Status PENDING  Incomplete  Resp Panel by RT-PCR (Flu A&B, Covid) Nasopharyngeal Swab     Status: None   Collection Time: 04/06/20  1:50 PM   Specimen: Nasopharyngeal Swab; Nasopharyngeal(NP) swabs in vial transport medium  Result Value Ref Range Status   SARS Coronavirus 2 by RT PCR NEGATIVE NEGATIVE Final    Comment: (NOTE) SARS-CoV-2 target nucleic acids are NOT DETECTED.  The SARS-CoV-2 RNA is generally detectable in upper respiratory specimens during the acute phase of infection. The lowest concentration of SARS-CoV-2 viral copies this assay can detect is 138 copies/mL. A negative result does not preclude SARS-Cov-2 infection and should not be used as the sole basis for treatment or other patient management decisions. A negative result may occur with  improper specimen collection/handling, submission of specimen other than nasopharyngeal swab, presence of viral mutation(s) within the areas targeted by this assay, and inadequate number of viral copies(<138 copies/mL). A negative result must be combined with clinical observations, patient history, and epidemiological information. The expected result is Negative.  Fact Sheet for Patients:  EntrepreneurPulse.com.au  Fact Sheet for Healthcare Providers:  IncredibleEmployment.be  This test is no t yet approved or cleared by the [place] FDA and  has been authorized for detection and/or diagnosis of SARS-CoV-2 by FDA under an Emergency Use Authorization (EUA). This EUA will remain  in effect (meaning this test can be used) for the duration of the COVID-19 declaration under Section 564(b)(1) of the Act, 21 U.S.C.section 360bbb-3(b)(1), unless the authorization is terminated  or revoked sooner.       Influenza A by PCR NEGATIVE NEGATIVE Final   Influenza B by PCR NEGATIVE NEGATIVE Final    Comment: (NOTE) The Xpert Xpress SARS-CoV-2/FLU/RSV plus  assay is intended as an aid in the diagnosis of influenza from Nasopharyngeal swab specimens and should not be used as a sole basis for treatment. Nasal washings and aspirates are unacceptable for Xpert Xpress SARS-CoV-2/FLU/RSV testing.  Fact Sheet for Patients: EntrepreneurPulse.com.au  Fact Sheet for Healthcare Providers: IncredibleEmployment.be  This test is not yet approved or cleared by the [place] FDA and has been authorized for detection and/or diagnosis of SARS-CoV-2 by FDA under an Emergency Use Authorization (EUA). This EUA will remain in effect (meaning this test can be used) for the duration of the COVID-19 declaration under Section 564(b)(1) of the Act, 21 U.S.C. section 360bbb-3(b)(1), unless the authorization is terminated or revoked.  Performed at [facility], [address] [place], [place] [address]     Coagulation Studies: No results  for input(s): LABPROT, INR in the last 72 hours.  Urinalysis: No results for input(s): COLORURINE, LABSPEC, PHURINE, GLUCOSEU, HGBUR, BILIRUBINUR, KETONESUR, PROTEINUR, UROBILINOGEN, NITRITE, LEUKOCYTESUR in the last 72 hours.  Invalid input(s): APPERANCEUR    Imaging: No results found.   Medications:   . ceFEPime (MAXIPIME) IV 2 g (04/10/20 0626)  . Immune Globulin 10% 0 g (04/09/20 0519)   . amiodarone  200 mg Oral BID  . DULoxetine  30 mg Oral Daily  . ipratropium-albuterol  3 mL Nebulization TID  . levothyroxine  75 mcg Oral QAC breakfast  . methotrexate  10 mg Oral Weekly  . pantoprazole  40 mg Oral Daily  . polyethylene glycol  17 g Oral Daily  . pyridostigmine  60 mg Oral TID  . risperiDONE  1 mg Oral QHS  . senna-docusate  2 tablet Oral BID   acetaminophen **OR** acetaminophen, guaiFENesin-dextromethorphan  Assessment/ Plan:  70 y.o. female with medical problems of obstructive sleep apnea, seizure disorder, GERD, hyperlipidemia, hypertension,  hypothyroidism, myasthenia gravis treated with methotrexate and Mestinon was admitted on 04/06/2020 for  Principal Problem:   CAP (community acquired pneumonia) Active Problems:   Hypertension   Myasthenia gravis ([place])   MG with exacerbation (myasthenia gravis) ([place])   Hypothyroidism   Shortness of breath   Acute hyponatremia  Swelling [R60.9] Myasthenia ([place]) [G70.00] CAP (community acquired pneumonia) [J18.9] Immunosuppression ([place]) [D84.9] Community acquired pneumonia of left lower lobe of lung [J18.9]  #. Hyponatremia -levels improved today to 135 -Improved oral intake -No need for IVF at this time  Will sign-off case.  Appreciate involvement with this case. Available for re-consult if needed    LOS: [place] 2/22/202212:15 PM  [place], [place]  Patient was seen and evaluated with Colon Flattery, NP.  Plan of care was discussed with patient as well as NP.  I agree with the note as documented.

## 2020-04-10 NOTE — Progress Notes (Signed)
Speech Language Pathology Treatment: Dysphagia  Patient Details Name: Madison Burns MRN: [id number] DOB: 05-19-1950 Today's Date: 04/10/2020 Time: 7564-3329 SLP Time Calculation (min) (ACUTE ONLY): 25 min  Assessment / Plan / Recommendation Clinical Impression  Pt seen today for ongoing toleration of diet; education w/ Sister present in room, and w/ pt, on the results of the MBSS performed on 04/09/2020(No aspiration occurring during intake of trials). Pt fed self breakfast meal this morning w/out [place] deficits noted. Noted wbc wnl, remains on 3L o2 support w/ Cardiology following. Pt can be IMPULSIVE in her eating behaviors per Sister's description -- this has been ongoing for years. Sister stated pt has demonstrated "some" improvement at home at times.   Noted pt was lying reclined/flat post Lunch meal -- pt has a BASELINE of GERD/REFLUX per Sister and is on PPI and "another medication b/f meals also". Discussed the importance of the PPI but also of following Behavioral strategies to help control Reflux/GERD including sitting Upright post meals. Also noted pt exhibited an intermittent hacking, dry cough which could have been related to REFLUX ongoing post the meal and lying reclined.  W/ Sister feeding her sips of thin liquids via straw after a cough, pt was positioned more upright w/ head forward for the intake. Sister agreed. No overt s/s of aspiration noted w/ the trials of thin liquids given intermittently during session. Again, suspect the hacking cough could have been related to REFLUX post the meal.  Reviewed and discussed general aspiration precautions, REFLUX precautions, and suggestions for controlling IMPULSIVE eating behaviors -- explained such Impulsive eating can lead to choking and increased REFLUX/GERD. Suggested using a small dish or saucer and only giving small amounts of food at one time w/ a break in between each saucer; small amounts of liquid in a Cup at one time refilling as  needed also; and verbal and/or visual cues at the Table to eat and drink 1 sip/bite at a time, Slowly. Pt could repeat the general aspiration precautions after this SLP, but unsure of any carry over d/t her Cognitive decline Baseline. Pt could benefit from f/u at home w/ Cognitive Behavioral training and strategies for mealtimes. Handouts given on above. Encouraged monitoring and Supervision at meals for following through w/ general aspiration and REFLUX precautions to lessen risk for choking and REFLUX backflow which can lead to aspiration of the REFLUX material thus pulmonary impact and decline. Pills in puree for ease of swallowing as needed; NO Straws and pt to hold Cup and feed self as able to promote independence. SW and NSG updated. ST services will sign off at this time w/ NSG to reconsult if any new needs arise. Recommend f/u w/ Home Health OT services for Cognitive Behavioral tx on Impulsive Eating Behavior strategies as needed. SW/Sister agreed.      HPI HPI: Pt is a 70 y.o. female  with past medical history of myasthenia gravis, cognitive impairment, Obesity, hyperlipidemia, GERD, hypothyroidism, irritable bowel syndrome, OSA on CPAP, history of seizure disorder, history of kidney cancer, abdominal hysterectomy in 1990, cholecystectomy in 1999. She was admitted to [facility] on 04/06/2020 with sepsis due to left basilar pneumonia after presenting from home to [facility] ED complaining of shortness of breath. Family reports that this constellation of symptoms is very similar to that with which the patient presented to [facility] in September 2021, at which time she was hospitalized for pneumonia in the setting of associated myasthenia gravis exacerbation.  CXR: "Effusion and opacity in left  base is similar in the interval. Subtle  patchy opacities in the right upper and lower lung.".  Per report, pt has a Baseline of Impulsive eating behaviors.  MBSS completed 04/09/2020 w/ No aspiration of  po consistencies occurring during study.  Pt has a Baseline of GERD, Reflux -- on PPI.      SLP Plan  All goals met (education completed)       Recommendations  Diet recommendations: Regular;Thin liquid Liquids provided via: Cup Medication Administration: Whole meds with puree (for ease of swallowing; Crushed as needed per NSG) Supervision: Patient able to self feed;Intermittent supervision to cue for compensatory strategies (Monitor and control for Impulsive eating behaviors) Compensations: Minimize environmental distractions;Slow rate;Small sips/bites;Lingual sweep for clearance of pocketing;Follow solids with liquid (Control Impulsive Eating Behaviors) Postural Changes and/or Swallow Maneuvers: Seated upright 90 degrees;Out of bed for meals;Upright 30-60 min after meal (GERD, Reflux precautions baseline)                General recommendations:  (Dietician consult and f/u) Oral Care Recommendations: Oral care BID;Oral care before and after PO;Staff/trained caregiver to provide oral care (support pt) Follow up Recommendations: None SLP Visit Diagnosis: Dysphagia, unspecified (R13.10) (Impulsive Eating Behaviors d/t Cognitive decline) Plan: All goals met (education completed)       GO                 Orinda Kenner, MS, CCC-SLP Speech Language Pathologist Rehab Services [phone] [facility] 04/10/2020, 3:47 PM

## 2020-04-10 NOTE — Progress Notes (Signed)
NIF -18. Pt. Unable to perform VC.

## 2020-04-10 NOTE — Consult Note (Signed)
Pharmacy Antibiotic Note  Madison Burns is a 70 y.o. female admitted on 04/06/2020 with pneumonia. PMH significant for GERD, hx of seizure, HLD, HTN, Hypothyroidism, Myasthenia gravis on Methotrexate who presents with increasing SOB over the last 2 days with cough + weakness and difficulty swallowing for a day. Pharmacy has been consulted for cefepime and vancomycin dosing.  Plan: Continue cefepime 2g q8h Vancomycin was discontinued for negative MRSA PCR  Monitor clinical picture and renal function F/U C&S, abx deescalation / LOT   Temp (24hrs), Avg:98.1 F (36.7 C), Min:97.5 F (36.4 C), Max:98.9 F (37.2 C)  Recent Labs  Lab 04/06/20 1124 04/06/20 1350 04/07/20 0535 04/07/20 1618 04/07/20 1824 04/08/20 0413 04/09/20 0456 04/10/20 0422  WBC 17.3*  --  12.7*  --   --  11.9* 13.4* 9.6  CREATININE 0.99  --  0.78  --   --  0.81 0.76 0.78  LATICACIDVEN  --  1.5  --  1.7 1.7  --   --   --     Estimated Creatinine Clearance: 71.6 mL/min (by C-G formula based on SCr of 0.78 mg/dL).    No Known Allergies  Antimicrobials this admission: 2/18 cefepime >> 2/18 vancomycin >> 2/20  Dose adjustments this admission:  Microbiology results: 2/18 BCx: NG x 4 days 2/18 MRSA PCR: negative  Thank you for allowing pharmacy to be a part of this patient's care.  Darnelle Bos, PharmD Clinical Pharmacist 04/10/2020 1:16 PM

## 2020-04-10 NOTE — Progress Notes (Signed)
Subjective: Brief review of HPI: 70 y.o.femalewith hx of myasthenia gravis on Mestinon and methotrexate who presented with acute hypoxic respiratory failure and sepsis due to left basilar pneumonia. She also had hyponatremia at the time of admission. There was some concern for a possible myasthenia exacerbation as well and she was started on empiric IVIG for this.   During her stay she required BiPAP initially and later weaned off to nasal cannula. On Sunday she had a choking episode while eating breakfast followed by constant coughing and audible wheezing requiring IV steroid and DuoNeb. Her Na continued to drop and Nephrology consult was requested. She has also been diagnosed with new onset atrial fibrillation with RVR; yesterday her HR was in the 110s to 130s and Cardiology was consulted. Her Na improved to 130 as well. Today (2/22), her HR is under much better control with oral amiodarone.    Objective: Current vital signs: BP 118/71 (BP Location: Right Arm)   Pulse (!) 103   Temp 98.6 F (37 C) (Oral)   Resp 14   Ht 5\' 3"  (1.6 m)   Wt 89.8 kg   SpO2 100%   BMI 35.07 kg/m  Vital signs in last 24 hours: Temp:  [97.5 F (36.4 C)-98.9 F (37.2 C)] 98.6 F (37 C) (02/22 1700) Pulse Rate:  [93-103] 103 (02/22 1700) Resp:  [14-21] 14 (02/22 1700) BP: (112-132)/(58-84) 118/71 (02/22 1700) SpO2:  [99 %-100 %] 100 % (02/22 1700) Weight:  [89.8 kg] 89.8 kg (02/22 0500)  Intake/Output from previous day: 02/21 0701 - 02/22 0700 In: 1080 [P.O.:1080] Out: 1700 [Urine:1700] Intake/Output this shift: No intake/output data recorded. Nutritional status:  Diet Order            Diet regular Room service appropriate? Yes with Assist; Fluid consistency: Thin  Diet effective now                HEENT: [place]/AT Lungs: Respirations unlabored Ext: Mild pitting edema to distal BLE and left hand  Neurologic Exam: Mental Status: Awake with decreased level of alertness. Increased latencies of  verbal responses. Answers orientation questions with some difficulty. Speech mildly dysarthric intermittently, but fluent. She is able to follow all simple commands.  Cranial Nerves: II:  PERRL. Fixates and tracks normally.  III,IV, VI: No ptosis. EOMI. Able to bury sclerae. Able to maintain upgaze for 20 seconds without fatiguing or onset of ptosis.  V,VII: Smile slightly asymmetric, with subtly decreased movement on the left. Facial light touch sensation grossly intact.  VIII: Hearing intact to voice IX,X: Mild hypophonia XI: Symmetric XII: midline tongue extension  Motor: Poor effort x 4.  BUE without drift when holding antigravity.  Maximum BUE strength elicitable is 4/5 proximally and distally Maximum BLE strength elicitable is 2/5 at hips; will not lift off bed. Knee extension with poor effort rated at 4-/5 bilaterally. ADF and APF max strength is 4-/5 with poor effort.  Sensory: Lght touch intact throughout, bilaterally Deep Tendon Reflexes:  1+ bilateral brachioradialis.  Unable to elicit patellar reflexes.  Cerebellar: No ataxia with FNF bilaterally  Gait: Unable to assess due to weakness  Lab Results: Results for orders placed or performed during the hospital encounter of 04/06/20 (from the past 48 hour(s))  Procalcitonin     Status: None   Collection Time: 04/09/20  4:56 AM  Result Value Ref Range   Procalcitonin 0.11 ng/mL    Comment:        Interpretation: PCT (Procalcitonin) <= 0.5 ng/mL: Systemic infection (  sepsis) is not likely. Local bacterial infection is possible. (NOTE)       Sepsis PCT Algorithm           Lower Respiratory Tract                                      Infection PCT Algorithm    ----------------------------     ----------------------------         PCT < 0.25 ng/mL                PCT < 0.10 ng/mL          Strongly encourage             Strongly discourage   discontinuation of antibiotics    initiation of antibiotics     ----------------------------     -----------------------------       PCT 0.25 - 0.50 ng/mL            PCT 0.10 - 0.25 ng/mL               OR       >80% decrease in PCT            Discourage initiation of                                            antibiotics      Encourage discontinuation           of antibiotics    ----------------------------     -----------------------------         PCT >= 0.50 ng/mL              PCT 0.26 - 0.50 ng/mL               AND        <80% decrease in PCT             Encourage initiation of                                             antibiotics       Encourage continuation           of antibiotics    ----------------------------     -----------------------------        PCT >= 0.50 ng/mL                  PCT > 0.50 ng/mL               AND         increase in PCT                  Strongly encourage                                      initiation of antibiotics    Strongly encourage escalation           of antibiotics                                     -----------------------------  PCT <= 0.25 ng/mL                                                 OR                                        > 80% decrease in PCT                                      Discontinue / Do not initiate                                             antibiotics  Performed at [facility], [address] [place], [place] [address]   CBC     Status: Abnormal   Collection Time: 04/09/20  4:56 AM  Result Value Ref Range   WBC 13.4 (H) 4.0 - 10.5 K/uL   RBC 3.32 (L) 3.87 - 5.11 MIL/uL   Hemoglobin 10.2 (L) 12.0 - 15.0 g/dL   HCT 30.6 (L) 36.0 - 46.0 %   MCV 92.2 80.0 - 100.0 fL   MCH 30.7 26.0 - 34.0 pg   MCHC 33.3 30.0 - 36.0 g/dL   RDW 15.0 11.5 - 15.5 %   Platelets 230 150 - 400 K/uL   nRBC 0.0 0.0 - 0.2 %    Comment: Performed at [facility], [address], [place], [place] [address]  Basic metabolic panel      Status: Abnormal   Collection Time: 04/09/20  4:56 AM  Result Value Ref Range   Sodium 130 (L) 135 - 145 mmol/L   Potassium 4.9 3.5 - 5.1 mmol/L   Chloride 99 98 - 111 mmol/L   CO2 25 22 - 32 mmol/L   Glucose, Bld 145 (H) 70 - 99 mg/dL    Comment: Glucose reference range applies only to samples taken after fasting for at least 8 hours.   BUN 20 8 - 23 mg/dL   Creatinine, Ser 0.76 0.44 - 1.00 mg/dL   Calcium 8.3 (L) 8.9 - 10.3 mg/dL   GFR, Estimated >60 >60 mL/min    Comment: (NOTE) Calculated using the CKD-EPI Creatinine Equation (2021)    Anion gap 6 5 - 15    Comment: Performed at [facility], [address] [place], [place] [address]  CBC     Status: Abnormal   Collection Time: 04/10/20  4:22 AM  Result Value Ref Range   WBC 9.6 4.0 - 10.5 K/uL   RBC 3.42 (L) 3.87 - 5.11 MIL/uL   Hemoglobin 10.5 (L) 12.0 - 15.0 g/dL   HCT 32.7 (L) 36.0 - 46.0 %   MCV 95.6 80.0 - 100.0 fL   MCH 30.7 26.0 - 34.0 pg   MCHC 32.1 30.0 - 36.0 g/dL   RDW 15.4 11.5 - 15.5 %   Platelets 252 150 - 400 K/uL   nRBC 0.0 0.0 - 0.2 %    Comment: Performed at [facility], [address], [place],  [address]  Basic metabolic panel  Status: Abnormal   Collection Time: 04/10/20  4:22 AM  Result Value Ref Range   Sodium 135 135 - 145 mmol/L   Potassium 4.4 3.5 - 5.1 mmol/L   Chloride 103 98 - 111 mmol/L   CO2 27 22 - 32 mmol/L   Glucose, Bld 106 (H) 70 - 99 mg/dL    Comment: Glucose reference range applies only to samples taken after fasting for at least 8 hours.   BUN 21 8 - 23 mg/dL   Creatinine, Ser 0.78 0.44 - 1.00 mg/dL   Calcium 8.2 (L) 8.9 - 10.3 mg/dL   GFR, Estimated >60 >60 mL/min    Comment: (NOTE) Calculated using the CKD-EPI Creatinine Equation (2021)    Anion gap 5 5 - 15    Comment: Performed at [facility], [address], [place], [place] [address]    Recent Results (from the past 240 hour(s))  MRSA PCR Screening     Status: None    Collection Time: 04/06/20  9:50 AM   Specimen: Nasopharyngeal  Result Value Ref Range Status   MRSA by PCR NEGATIVE NEGATIVE Final    Comment:        The GeneXpert MRSA Assay (FDA approved for NASAL specimens only), is one component of a comprehensive MRSA colonization surveillance program. It is not intended to diagnose MRSA infection nor to guide or monitor treatment for MRSA infections. Performed at [facility], [address] [place], [place] [address]   Blood culture (routine x 2)     Status: None (Preliminary result)   Collection Time: 04/06/20  1:50 PM   Specimen: BLOOD  Result Value Ref Range Status   Specimen Description BLOOD LEFT ANTECUBITAL  Final   Special Requests   Final    BOTTLES DRAWN AEROBIC AND ANAEROBIC Blood Culture results may not be optimal due to an inadequate volume of blood received in culture bottles   Culture   Final    NO GROWTH 4 DAYS Performed at [facility], [address], [place], [place] [address]    Report Status PENDING  Incomplete  Blood culture (routine x 2)     Status: None (Preliminary result)   Collection Time: 04/06/20  1:50 PM   Specimen: BLOOD  Result Value Ref Range Status   Specimen Description BLOOD RIGHT ANTECUBITAL  Final   Special Requests   Final    BOTTLES DRAWN AEROBIC AND ANAEROBIC Blood Culture results may not be optimal due to an inadequate volume of blood received in culture bottles   Culture   Final    NO GROWTH 4 DAYS Performed at [facility], [address], [place], [place] [address]    Report Status PENDING  Incomplete  Resp Panel by RT-PCR (Flu A&B, Covid) Nasopharyngeal Swab     Status: None   Collection Time: 04/06/20  1:50 PM   Specimen: Nasopharyngeal Swab; Nasopharyngeal(NP) swabs in vial transport medium  Result Value Ref Range Status   SARS Coronavirus 2 by RT PCR NEGATIVE NEGATIVE Final    Comment: (NOTE) SARS-CoV-2 target nucleic acids are NOT DETECTED.  The  SARS-CoV-2 RNA is generally detectable in upper respiratory specimens during the acute phase of infection. The lowest concentration of SARS-CoV-2 viral copies this assay can detect is 138 copies/mL. A negative result does not preclude SARS-Cov-2 infection and should not be used as the sole basis for treatment or other patient management decisions. A negative result may occur with  improper specimen collection/handling, submission of  specimen other than nasopharyngeal swab, presence of viral mutation(s) within the areas targeted by this assay, and inadequate number of viral copies(<138 copies/mL). A negative result must be combined with clinical observations, patient history, and epidemiological information. The expected result is Negative.  Fact Sheet for Patients:  EntrepreneurPulse.com.au  Fact Sheet for Healthcare Providers:  IncredibleEmployment.be  This test is no t yet approved or cleared by the [place] FDA and  has been authorized for detection and/or diagnosis of SARS-CoV-2 by FDA under an Emergency Use Authorization (EUA). This EUA will remain  in effect (meaning this test can be used) for the duration of the COVID-19 declaration under Section 564(b)(1) of the Act, 21 U.S.C.section 360bbb-3(b)(1), unless the authorization is terminated  or revoked sooner.       Influenza A by PCR NEGATIVE NEGATIVE Final   Influenza B by PCR NEGATIVE NEGATIVE Final    Comment: (NOTE) The Xpert Xpress SARS-CoV-2/FLU/RSV plus assay is intended as an aid in the diagnosis of influenza from Nasopharyngeal swab specimens and should not be used as a sole basis for treatment. Nasal washings and aspirates are unacceptable for Xpert Xpress SARS-CoV-2/FLU/RSV testing.  Fact Sheet for Patients: EntrepreneurPulse.com.au  Fact Sheet for Healthcare Providers: IncredibleEmployment.be  This test is not yet approved or  cleared by the [place] FDA and has been authorized for detection and/or diagnosis of SARS-CoV-2 by FDA under an Emergency Use Authorization (EUA). This EUA will remain in effect (meaning this test can be used) for the duration of the COVID-19 declaration under Section 564(b)(1) of the Act, 21 U.S.C. section 360bbb-3(b)(1), unless the authorization is terminated or revoked.  Performed at [facility], [address] [place], [place] [address]     Lipid Panel No results for input(s): CHOL, TRIG, HDL, CHOLHDL, VLDL, LDLCALC in the last 72 hours.  Studies/Results: No results found.  Medications:  Scheduled: . amiodarone  200 mg Oral BID  . DULoxetine  30 mg Oral Daily  . ipratropium-albuterol  3 mL Nebulization TID  . levothyroxine  75 mcg Oral QAC breakfast  . methotrexate  10 mg Oral Weekly  . pantoprazole  40 mg Oral Daily  . pyridostigmine  60 mg Oral TID  . risperiDONE  1 mg Oral QHS   Continuous: . ceFEPime (MAXIPIME) IV 2 g (04/10/20 1402)  . Immune Globulin 10% 0 g (04/09/20 0519)    Assessment: 70 y.o.femalewith PMHx significant for Myasthenia gravis on mestinon and Methotrexate who presents with community-acquired pneumonia, hyponatremia and weakness. Weakness likely multifactorial, but myasthenic exacerbation was a consideration.  - Intermittent mild dysarthria and dysphagia with choking spell may be secondary to bulbar weakness, which would make a myasthenic exacerbation more likely.  - Also with limb weakness on exam. This may be multifactorial as well, but her myasthenia gravis may be playing a role.   Recommendations:  -ContinueIVIG 0.4g/Kg every 24 hours x 5 doses(last dose today) -Continue Q6hoursNIFs and VC. - Recommend elective intubation for respiratory compromise ifFVC falls below 15 to 20 mL/kgand or NIFs falls below -20 cm H2O.If poor effort and not sure, can always get ABG to assess for CO2 retention. Elective intubation for  airway compromise if she has difficulty clearing her secretions. Oxygen saturation should not be used to make decision regarding intubation. -ContinueMestinonPO60 mg TID. If unable to swallow, can switch fromPO to IV.but must account for 30:1 conversion ratio (30 mg PO is equivalent to 1 mg IV). - Would prefer to continue Methotrexate but if her Pneumonia  continues to worsen, we can discontinue it and resume later. -Neurology has ordered [facility] Ab, she was found to have a lung nodule on CT Chest recently. Important to assess for potenial Rita [place].syndrome -Medications that may worsen or trigger MG exacerbation: Class IA antiarrhythmics, magnesium, flouroquinolones, macrolides, aminoglycosides, penicillamine, curare, interferon alpha, botox, quinine. Use with caution:calcium channel blocker,beta blockers andstatins.     LOS: 4 days   @Electronically  signed: Dr. Kerney Elbe 04/10/2020  6:45 PM

## 2020-04-10 NOTE — Progress Notes (Signed)
[facility]) Hospital Liaison RN note:  Received new referral for [facility] out patient palliative program to follow post discharge from [place], TOC. Patient information given to referral. Plan is to discharge home when stable. [place] Liaison will follow for disposition.  Thank you for this referral.  Zandra Abts, RN [facility] Liaison [phone]

## 2020-04-11 ENCOUNTER — Ambulatory Visit: Payer: Self-pay | Admitting: Urology

## 2020-04-11 ENCOUNTER — Ambulatory Visit: Admission: RE | Admit: 2020-04-11 | Payer: PPO | Source: Ambulatory Visit

## 2020-04-11 LAB — CULTURE, BLOOD (ROUTINE X 2)
Culture: NO GROWTH
Culture: NO GROWTH

## 2020-04-11 MED ORDER — FESOTERODINE FUMARATE ER 4 MG PO TB24
4.0000 mg | ORAL_TABLET | Freq: Every day | ORAL | Status: DC
  Administered 2020-04-11 – 2020-04-13 (×3): 4 mg via ORAL
  Filled 2020-04-11 (×3): qty 1

## 2020-04-11 MED ORDER — ENOXAPARIN SODIUM 60 MG/0.6ML ~~LOC~~ SOLN
0.5000 mg/kg | SUBCUTANEOUS | Status: DC
  Administered 2020-04-11 – 2020-04-12 (×2): 45 mg via SUBCUTANEOUS
  Filled 2020-04-11 (×2): qty 0.6

## 2020-04-11 NOTE — Progress Notes (Signed)
NIF -16  Patient performed NIF to best of her ability but unable to perform VC at this time.

## 2020-04-11 NOTE — Progress Notes (Signed)
Patient Name: Madison Burns Date of Encounter: 04/11/2020  Hospital Problem List     Principal Problem:   CAP (community acquired pneumonia) Active Problems:   Hypertension   Myasthenia gravis ([place])   MG with exacerbation (myasthenia gravis) ([place])   Hypothyroidism   Shortness of breath   Acute hyponatremia    Patient Profile     70 year old female with a past medical history significant for myasthenia gravis, cognitive impairment, and hyperlipidemia who presented to the ED on 04/06/20 for a two day history of increased shortness of breath with an associated cough and weakness.  She was admitted for acute hypoxic respiratory failure, requiring BiPAP, thought to be secondary to pneumonia. Neurology was consulted for MG exacerbation as well.  While admitted, she was noted to be in new onset atrial fibrillation with RVR which warranted cardiac consultation.  She is followed in outpatient cardiology by Dr. Bartholome Bill. Most recent echocardiogram on 10/24/19 revealed normal RV and LV systolic function with an EF estimated between 70-75% with no evidence of significant valvular disease.  On exam, Madison Burns is sitting up in bed, in no acute distress.  She admits to an episode of chest discomfort a few days ago, but currently denies any chest pain, palpitations, or heart racing.  She is somewhat of a poor historian however her sister is at bedside and gives history.    Subjective   No cardiac complaints  Inpatient Medications    . amiodarone  200 mg Oral BID  . DULoxetine  30 mg Oral Daily  . ipratropium-albuterol  3 mL Nebulization TID  . levothyroxine  75 mcg Oral QAC breakfast  . methotrexate  10 mg Oral Weekly  . pantoprazole  40 mg Oral Daily  . pyridostigmine  60 mg Oral TID  . risperiDONE  1 mg Oral QHS    Vital Signs    Vitals:   04/10/20 2303 04/10/20 2337 04/11/20 0416 04/11/20 0845  BP: 134/84 133/75 136/83 122/78  Pulse: (!) 107 (!) 106 (!) 109 (!) 105  Resp: 18  18 14    Temp: 97.6 F (36.4 C) 98.3 F (36.8 C) 98.6 F (37 C) 98.7 F (37.1 C)  TempSrc:      SpO2: 99% 98% 99% 99%  Weight:      Height:        Intake/Output Summary (Last 24 hours) at 04/11/2020 0934 Last data filed at 04/11/2020 3532 [place] per 24 hour  Intake 240 ml  Output 1200 ml  Net -960 ml   Filed Weights   04/07/20 1635 04/10/20 0500  Weight: 89.8 kg 89.8 kg    Physical Exam    GEN: Well nourished, well developed, in no acute distress.  HEENT: normal.  Neck: Supple, no JVD, carotid bruits, or masses. Cardiac: RRR, no murmurs, rubs, or gallops. No clubbing, cyanosis, edema.  Radials/DP/PT 2+ and equal bilaterally.  Respiratory:  Respirations regular and unlabored, clear to auscultation bilaterally. GI: Soft, nontender, nondistended, BS + x 4. MS: no deformity or atrophy. Skin: warm and dry, no rash. Neuro:  Strength and sensation are intact. Psych: Normal affect.  Labs    CBC Recent Labs    04/09/20 0456 04/10/20 0422  WBC 13.4* 9.6  HGB 10.2* 10.5*  HCT 30.6* 32.7*  MCV 92.2 95.6  PLT 230 992   Basic Metabolic Panel Recent Labs    04/09/20 0456 04/10/20 0422  NA 130* 135  K 4.9 4.4  CL 99 103  CO2  25 27  GLUCOSE 145* 106*  BUN 20 21  CREATININE 0.76 0.78  CALCIUM 8.3* 8.2*   Liver Function Tests No results for input(s): AST, ALT, ALKPHOS, BILITOT, PROT, ALBUMIN in the last 72 hours. No results for input(s): LIPASE, AMYLASE in the last 72 hours. Cardiac Enzymes No results for input(s): CKTOTAL, CKMB, CKMBINDEX, TROPONINI in the last 72 hours. BNP No results for input(s): BNP in the last 72 hours. D-Dimer No results for input(s): DDIMER in the last 72 hours. Hemoglobin A1C No results for input(s): HGBA1C in the last 72 hours. Fasting Lipid Panel No results for input(s): CHOL, HDL, LDLCALC, TRIG, CHOLHDL, LDLDIRECT in the last 72 hours. Thyroid Function Tests No results for input(s): TSH, T4TOTAL, T3FREE, THYROIDAB in the last 72  hours.  Invalid input(s): FREET3  Telemetry    Normal sinus rhythm  ECG    Initial ECG revealing sinus tachycardia; telemetry revealing atrial fibrillation with rapid ventricular response.  Current EKG shows Normal sinus rhythm  Radiology    DG Chest 2 View  Result Date: 04/06/2020 CLINICAL DATA:  Shortness of breath.  Chest pain. EXAM: CHEST - 2 VIEW COMPARISON:  November 04, 2019. FINDINGS: Mild cardiomegaly is noted with central pulmonary vascular congestion. No pneumothorax is noted. Left basilar atelectasis is noted with small left pleural effusion. Stable elevated left hemidiaphragm. Bony thorax is unremarkable. IMPRESSION: Stable elevated left hemidiaphragm. Left basilar atelectasis with small left pleural effusion. Electronically Signed   By: Marijo Conception M.D.   On: 04/06/2020 11:56   MR ABDOMEN WWO CONTRAST  Result Date: 03/23/2020 CLINICAL DATA:  Status post renal cryoablation EXAM: MRI ABDOMEN WITHOUT AND WITH CONTRAST TECHNIQUE: Multiplanar multisequence MR imaging of the abdomen was performed both before and after the administration of intravenous contrast. CONTRAST:  7.47mL GADAVIST GADOBUTROL 1 MMOL/ML IV SOLN COMPARISON:  CT abdomen dated 06/07/2019 FINDINGS: Lower chest: Small bilateral pleural effusions, left greater than right. Associated left lower lobe opacity, likely atelectasis. Hepatobiliary: Liver is within normal limits. Status post cholecystectomy. No intrahepatic or extrahepatic ductal dilatation. Pancreas:  Within normal limits. Spleen:  Within normal limits. Adrenals/Urinary Tract: Mild low-density thickening of the bilateral adrenal glands, left greater than right. Postprocedural changes related to left lower pole renal cryoablation. Cryoablation zone measures approximately 2.5 x 2.9 x 3.1 cm. Mild intrinsic T1 hyperintensity (series 15/image 67) reflects coagulative necrosis. No residual enhancement on postcontrast subtraction imaging to suggest viable tumor.  Additional small bilateral renal cysts, including a 2.2 cm left renal sinus cyst (series 5/image 28). No enhancing renal lesions. No hydronephrosis. Stomach/Bowel: Stomach and visualized bowel are grossly unremarkable. Vascular/Lymphatic:  No evidence of abdominal aortic aneurysm. No suspicious abdominal lymphadenopathy. Other:  No abdominal ascites. Musculoskeletal: No focal osseous lesions. IMPRESSION: Postprocedural changes related to left lower pole renal cryoablation. No evidence of viable tumor. Small bilateral pleural effusions, left greater than right. Associated left lower lobe opacity, likely atelectasis. Electronically Signed   By: Julian Hy M.D.   On: 03/23/2020 11:21   US Venous Img Lower Bilateral (DVT)  Result Date: 04/07/2020 CLINICAL DATA:  Swelling. EXAM: BILATERAL LOWER EXTREMITY VENOUS DOPPLER ULTRASOUND TECHNIQUE: Gray-scale sonography with compression, as well as color and duplex ultrasound, were performed to evaluate the deep venous system(s) from the level of the common femoral vein through the popliteal and proximal calf veins. COMPARISON:  None. FINDINGS: VENOUS Normal compressibility of the common femoral, superficial femoral, and popliteal veins, as well as the visualized calf veins. Visualized portions of profunda femoral  vein and great saphenous vein unremarkable. No filling defects to suggest DVT on grayscale or color Doppler imaging. Doppler waveforms show normal direction of venous flow, normal respiratory plasticity and response to augmentation. Limited views of the contralateral common femoral vein are unremarkable. OTHER None. Limitations: none IMPRESSION: Negative. Electronically Signed   By: Dorise Bullion III M.D   On: 04/07/2020 09:51   DG Chest Port 1 View  Result Date: 04/08/2020 CLINICAL DATA:  Pneumonia.  Shortness of breath.  Dysphagia. EXAM: PORTABLE CHEST 1 VIEW COMPARISON:  April 06, 2020 FINDINGS: Effusion and opacity in left base is similar in the  interval. Subtle patchy opacities in the right upper and lower lung. Stable cardiomegaly. No other interval changes. IMPRESSION: 1. Effusion with underlying opacity in left base is similar in the interval. 2. Suspected subtle infiltrates in the right upper and lower lobes, not seen previously. Recommend attention on follow-up. Electronically Signed   By: Dorise Bullion III M.D   On: 04/08/2020 12:44   IR Radiologist Eval & Mgmt  Result Date: 03/28/2020 Please refer to notes tab for details about interventional procedure. (Op Note)   Assessment & Plan      1.  New onset atrial fibrillation              -Patient currently in sinus rhythm.  Was on diltiazem but discontinued secondary to MG flare.  Placed on amiodarone at 200 mg twice daily and tolerating this well.  We will continue with this for now.                          -Will defer anticoagulation at this time  Signed, Javier Docker. Orel Cooler MD 04/11/2020, 9:34 AM  Pager: [phone]

## 2020-04-11 NOTE — Progress Notes (Signed)
NIF - 22. Pt. Unable to perform VC

## 2020-04-11 NOTE — Progress Notes (Signed)
Palliative: Ms. Pura, [place], is sitting up in the [place] chair in her room. She is alert and oriented to the situation, but has known developmental delays. She is able to make her basic needs known. Her sister/main caregiver, [name], is at bedside.  [name] and I talked about the cardiology visit this morning. [name] states that they really like Dr. Ubaldo Glassing, and feels that they have a good relationship. [name] is able to accurately describe the treatment plan for [facility] arrhythmia. Overall, [name] seems knowledgeable about [name]'s health care. We talked about the use of/need for oxygen. [name] states that [place] has oxygen nightly with her CPAP. She states that transition of care team has talked with him about the possibility for home oxygen.  We talked about discharge home with home health, and a timeline. All questions answered, no concerns at this time.  Plan: Continue to treat the treatable but no CPR or intubation. Use of BiPAP only. Home with home health services. Outpatient palliative to follow.  25 minutes Quinn Axe, NP Palliative medicine team Team phone [phone] Greater than 50% of this time was spent counseling and coordinating care related to the above assessment and plan.

## 2020-04-11 NOTE — Progress Notes (Signed)
Madison Burns  HYW:[id number] DOB: [birth year] DOA: 04/06/2020 PCP: Einar Pheasant, MD    Brief Narrative:  70 year old with a history of myasthenia gravis, cognitive impairment, hypothyroidism, sleep apnea on nocturnal CPAP, HTN, and HLD who was admitted to [facility] with sepsis due to a left basilar community-acquired pneumonia.  Antimicrobials:  Cefepime 2/18 > 2/22 Vancomycin 2/18 > 2/20  DVT prophylaxis: lovenox   Subjective: Afebrile.  Blood pressure stable.  Heart rate mildly elevated at 103-120.  Saturations 99% on 4 L nasal cannula.  Assessment & Plan:  Community-acquired pneumonia with sepsis POA -acute hypoxic respiratory failure Has completed 6 days of IV antibiotic -discontinue after final dose today -still on 4 L nasal cannula but saturations 100% so will order weaning to room air as able -does not usually require oxygen support at home other than nightly CPAP  Newly diagnosed atrial fibrillation with RVR Cardiology following and placed patient on oral amiodarone -patient quickly converted back to sinus rhythm and has maintained this therefore anticoagulation has not been initiated  Acute exacerbation of myasthenia gravis Neurology following -IVIG course completed-NIF being followed -Mestinon continues -appears to be improving  Hyponatremia Resolved  Hypothyroidism Continue usual Synthroid dose  Obesity  Sleep apnea Continue usual home CPAP   Code Status: FULL CODE Family Communication:  Status is: Inpatient  Remains inpatient appropriate because:Inpatient level of care appropriate due to severity of illness   Dispo: The patient is from: Home              Anticipated d/c is to: Home              Anticipated d/c date is: 1 day              Patient currently is not medically stable to d/c.   Difficult to place patient No   Consultants:  Neurology Cardiology  Objective: Blood pressure 122/78, pulse (!) 105, temperature 98.7 F (37.1 C), resp. rate  14, height 5\' 3"  (1.6 m), weight 89.8 kg, SpO2 99 %.  Intake/Output Summary (Last 24 hours) at 04/11/2020 0932 Last data filed at 04/11/2020 4854 Stigger per 24 hour  Intake 240 ml  Output 1200 ml  Net -960 ml   Filed Weights   04/07/20 1635 04/10/20 0500  Weight: 89.8 kg 89.8 kg    Examination: General: No acute respiratory distress Lungs: Clear to auscultation bilaterally without wheezes or crackles Cardiovascular: Regular rate and rhythm without murmur gallop or rub normal S1 and S2 Abdomen: Nontender, nondistended, soft, bowel sounds positive, no rebound, no ascites, no appreciable mass Extremities: No significant cyanosis, clubbing, or edema bilateral lower extremities  CBC: Recent Labs  Lab 04/06/20 1124 04/07/20 0535 04/08/20 0413 04/09/20 0456 04/10/20 0422  WBC 17.3* 12.7* 11.9* 13.4* 9.6  NEUTROABS 15.2* 10.1*  --   --   --   HGB 11.2* 10.5* 10.8* 10.2* 10.5*  HCT 34.1* 31.9* 32.8* 30.6* 32.7*  MCV 92.4 93.3 91.9 92.2 95.6  PLT 224 212 223 230 627   Basic Metabolic Panel: Recent Labs  Lab 04/07/20 0535 04/08/20 0413 04/09/20 0456 04/10/20 0422  NA 125* 124* 130* 135  K 4.1 4.1 4.9 4.4  CL 92* 94* 99 103  CO2 23 22 25 27   GLUCOSE 135* 143* 145* 106*  BUN 20 21 20 21   CREATININE 0.78 0.81 0.76 0.78  CALCIUM 8.5* 8.4* 8.3* 8.2*  PHOS 3.7  --   --   --    GFR: Estimated Creatinine Clearance: 71.6 mL/min (  by C-G formula based on SCr of 0.78 mg/dL).  Liver Function Tests: Recent Labs  Lab 04/06/20 1124 04/07/20 0535  AST 28 20  ALT 29 24  ALKPHOS 66 58  BILITOT 0.8 0.9  PROT 6.3* 6.1*  ALBUMIN 3.1* 2.7*    HbA1C: Hgb A1c MFr Bld  Date/Time Value Ref Range Status  12/14/2018 08:57 AM 5.4 4.6 - 6.5 % Final    Comment:    Glycemic Control Guidelines for People with Diabetes:Non Diabetic:  <6%Goal of Therapy: <7%Additional Action Suggested:  >8%   08/06/2018 10:07 AM 5.5 4.6 - 6.5 % Final    Comment:    Glycemic Control Guidelines for People  with Diabetes:Non Diabetic:  <6%Goal of Therapy: <7%Additional Action Suggested:  >8%      Recent Results (from the past 240 hour(s))  MRSA PCR Screening     Status: None   Collection Time: 04/06/20  9:50 AM   Specimen: Nasopharyngeal  Result Value Ref Range Status   MRSA by PCR NEGATIVE NEGATIVE Final    Comment:        The GeneXpert MRSA Assay (FDA approved for NASAL specimens only), is one component of a comprehensive MRSA colonization surveillance program. It is not intended to diagnose MRSA infection nor to guide or monitor treatment for MRSA infections. Performed at [facility], [address] [place], [place] [address]   Blood culture (routine x 2)     Status: None   Collection Time: 04/06/20  1:50 PM   Specimen: BLOOD  Result Value Ref Range Status   Specimen Description BLOOD LEFT ANTECUBITAL  Final   Special Requests   Final    BOTTLES DRAWN AEROBIC AND ANAEROBIC Blood Culture results may not be optimal due to an inadequate volume of blood received in culture bottles   Culture   Final    NO GROWTH 5 DAYS Performed at [facility], [address] [place], [place] [address]    Report Status 04/11/2020 FINAL  Final  Blood culture (routine x 2)     Status: None   Collection Time: 04/06/20  1:50 PM   Specimen: BLOOD  Result Value Ref Range Status   Specimen Description BLOOD RIGHT ANTECUBITAL  Final   Special Requests   Final    BOTTLES DRAWN AEROBIC AND ANAEROBIC Blood Culture results may not be optimal due to an inadequate volume of blood received in culture bottles   Culture   Final    NO GROWTH 5 DAYS Performed at [facility], [address] [place], [place] [address]    Report Status 04/11/2020 FINAL  Final  Resp Panel by RT-PCR (Flu A&B, Covid) Nasopharyngeal Swab     Status: None   Collection Time: 04/06/20  1:50 PM   Specimen: Nasopharyngeal Swab; Nasopharyngeal(NP) swabs in vial transport medium  Result Value Ref Range  Status   SARS Coronavirus 2 by RT PCR NEGATIVE NEGATIVE Final    Comment: (NOTE) SARS-CoV-2 target nucleic acids are NOT DETECTED.  The SARS-CoV-2 RNA is generally detectable in upper respiratory specimens during the acute phase of infection. The lowest concentration of SARS-CoV-2 viral copies this assay can detect is 138 copies/mL. A negative result does not preclude SARS-Cov-2 infection and should not be used as the sole basis for treatment or other patient management decisions. A negative result may occur with  improper specimen collection/handling, submission of specimen other than nasopharyngeal swab, presence of viral mutation(s) within the areas targeted by this assay, and inadequate  number of viral copies(<138 copies/mL). A negative result must be combined with clinical observations, patient history, and epidemiological information. The expected result is Negative.  Fact Sheet for Patients:  EntrepreneurPulse.com.au  Fact Sheet for Healthcare Providers:  IncredibleEmployment.be  This test is no t yet approved or cleared by the [place] FDA and  has been authorized for detection and/or diagnosis of SARS-CoV-2 by FDA under an Emergency Use Authorization (EUA). This EUA will remain  in effect (meaning this test can be used) for the duration of the COVID-19 declaration under Section 564(b)(1) of the Act, 21 U.S.C.section 360bbb-3(b)(1), unless the authorization is terminated  or revoked sooner.       Influenza A by PCR NEGATIVE NEGATIVE Final   Influenza B by PCR NEGATIVE NEGATIVE Final    Comment: (NOTE) The Xpert Xpress SARS-CoV-2/FLU/RSV plus assay is intended as an aid in the diagnosis of influenza from Nasopharyngeal swab specimens and should not be used as a sole basis for treatment. Nasal washings and aspirates are unacceptable for Xpert Xpress SARS-CoV-2/FLU/RSV testing.  Fact Sheet for  Patients: EntrepreneurPulse.com.au  Fact Sheet for Healthcare Providers: IncredibleEmployment.be  This test is not yet approved or cleared by the [place] FDA and has been authorized for detection and/or diagnosis of SARS-CoV-2 by FDA under an Emergency Use Authorization (EUA). This EUA will remain in effect (meaning this test can be used) for the duration of the COVID-19 declaration under Section 564(b)(1) of the Act, 21 U.S.C. section 360bbb-3(b)(1), unless the authorization is terminated or revoked.  Performed at [facility], [address] [place], [place] [address]      Scheduled Meds: . amiodarone  200 mg Oral BID  . DULoxetine  30 mg Oral Daily  . ipratropium-albuterol  3 mL Nebulization TID  . levothyroxine  75 mcg Oral QAC breakfast  . methotrexate  10 mg Oral Weekly  . pantoprazole  40 mg Oral Daily  . pyridostigmine  60 mg Oral TID  . risperiDONE  1 mg Oral QHS   Continuous Infusions: . ceFEPime (MAXIPIME) IV 2 g (04/11/20 0626)     LOS: 5 days   Cherene Altes, MD [facility] Office  [phone] Pager - Text Page per Amion  If 7PM-7AM, please contact night-coverage per Amion 04/11/2020, 9:32 AM

## 2020-04-11 NOTE — Progress Notes (Signed)
Anticoagulation monitoring(Lovenox):  69yo  F ordered Lovenox 40 mg Q24h  Filed Weights   04/07/20 1635 04/10/20 0500  Weight: 89.8 kg (198 lb) 89.8 kg (197 lb 15.6 oz)   BMI 35   Lab Results  Component Value Date   CREATININE 0.78 04/10/2020   CREATININE 0.76 04/09/2020   CREATININE 0.81 04/08/2020   Estimated Creatinine Clearance: 71.6 mL/min (by C-G formula based on SCr of 0.78 mg/dL). Hemoglobin & Hematocrit     Component Value Date/Time   HGB 10.5 (L) 04/10/2020 0422   HGB 14.7 04/04/2015 1600   HCT 32.7 (L) 04/10/2020 0422   HCT 43.2 04/04/2015 1600     Per Protocol for Patient with estCrcl >30 ml/min and BMI > 30, will transition to Lovenox 0.5 mg/kg (45 mg) Q24h     Chinita Greenland PharmD Clinical Pharmacist 04/11/2020

## 2020-04-12 NOTE — Progress Notes (Signed)
Occupational Therapy Treatment Patient Details Name: Madison Burns MRN: [id number] DOB: 1950-07-30 Today's Date: 04/12/2020    History of present illness Pt is a 70 y.o. female  that was admitted 04/06/2020 with sepsis due to left basilar pneumonia after presenting from home complaining of shortness of breath. Pt also noteded to be in afib with RVR this admission. PMH of of myasthenia gravis, cognitive impairment, hyperlipidemia, GERD, hypothyroidism, irritable bowel syndrome, OSA on CPAP, history of seizure disorder, history of kidney cancer, abdominal hysterectomy in 1990, cholecystectomy in 1999.   OT comments  Pt pleasant, agreeable to participation in session. Mod I in bed mobility, SUPV for safety with transfers and ambulation, with pt demonstrating slight impulsiveness. Max A for hygiene post toileting, CGA-Min A for grooming and UBD. Pt's HR remained btwn 123-133 throughout session. Oxygen saturation in mid-upper 90s on 2L O2 via [place], but dropping to 87% following ambulation without O2. Pt's sister/caregiver present in room during session and reports that pt's functional mobility and cognition are at or near baseline presently, although HR is elevated, and pt does not use supplemental O2 at home. Ms. Flavell will continue to benefit from OT in the acute setting, followed by [facility] post DC.     Follow Up Recommendations  Home health OT;Supervision/Assistance - 24 hour    Equipment Recommendations  None recommended by OT    Recommendations for Other Services      Precautions / Restrictions Precautions Precautions: Fall Restrictions Weight Bearing Restrictions: No       Mobility Bed Mobility Overal bed mobility: Modified Independent                  Transfers Overall transfer level: Needs assistance Equipment used: None Transfers: Sit to/from Stand Sit to Stand: Supervision         General transfer comment: Did issue gait belt to pt's sister for home use     Balance Overall balance assessment: Needs assistance Sitting-balance support: Feet supported Sitting balance-Leahy Scale: Good     Standing balance support: No upper extremity supported Standing balance-Leahy Scale: Good Standing balance comment: No falls in previous 12 months                           ADL either performed or assessed with clinical judgement   ADL Overall ADL's : Needs assistance/impaired Eating/Feeding: Modified independent;Set up   Grooming: Brushing hair;Wash/dry face;Supervision/safety;Sitting           Upper Body Dressing : Minimal assistance;Set up;Standing           Toileting- Clothing Manipulation and Hygiene: Maximal assistance Toileting - Clothing Manipulation Details (indicate cue type and reason): Max A for post-toilet hygiene     Functional mobility during ADLs: Minimal assistance       Vision Patient Visual Report: No change from baseline     Perception     Praxis      Cognition Arousal/Alertness: Awake/alert Behavior During Therapy: WFL for tasks assessed/performed Overall Cognitive Status: History of cognitive impairments - at baseline                                 General Comments: sister at bedside confirms pt is at baseline cognition        Exercises     Shoulder Instructions       General Comments Significant edema, BUE, most pronounced on L  Pertinent Vitals/ Pain       Pain Assessment: No/denies pain Faces Pain Scale: No hurt  Home Living                                          Prior Functioning/Environment              Frequency  Min 2X/week        Progress Toward Goals  OT Goals(current goals can now be found in the care plan section)  Progress towards OT goals: Progressing toward goals  Acute Rehab OT Goals Patient Stated Goal: to go home OT Goal Formulation: With patient/family Time For Goal Achievement: 04/24/20 Potential to Achieve  Goals: Good  Plan Discharge plan remains appropriate;Frequency remains appropriate    Co-evaluation                 AM-PAC OT "6 Clicks" Daily Activity     Outcome Measure   Help from another person eating meals?: A Little Help from another person taking care of personal grooming?: A Little Help from another person toileting, which includes using toliet, bedpan, or urinal?: A Lot Help from another person bathing (including washing, rinsing, drying)?: A Little Help from another person to put on and taking off regular upper body clothing?: A Little Help from another person to put on and taking off regular lower body clothing?: A Little 6 Click Score: 17    End of Session Equipment Utilized During Treatment: Oxygen  OT Visit Diagnosis: Muscle weakness (generalized) (M62.81);Unsteadiness on feet (R26.81)   Activity Tolerance Patient tolerated treatment well   Patient Left in bed;with call bell/phone within reach;with bed alarm set;with family/visitor present   Nurse Communication          Time: 5797-2820 OT Time Calculation (min): 10 min  Charges: OT General Charges $OT Visit: 1 Visit OT Treatments $Self Care/Home Management : 8-22 mins   Josiah Lobo, PhD, [place], OTR/L ascom [phone] 04/12/20, 4:45 PM

## 2020-04-12 NOTE — Care Management Important Message (Signed)
Important Message  Patient Details  Name: KAMONI DEPREE MRN: [id number] Date of Birth: 1950/12/09   Medicare Important Message Given:  Yes     Juliann Pulse A Allmond 04/12/2020, 11:09 AM

## 2020-04-12 NOTE — Plan of Care (Signed)
  Problem: Clinical Measurements: Goal: Will remain free from infection Outcome: Progressing   Problem: Clinical Measurements: Goal: Diagnostic test results will improve Outcome: Progressing   Problem: Clinical Measurements: Goal: Respiratory complications will improve Outcome: Progressing   Problem: Clinical Measurements: Goal: Ability to maintain clinical measurements within normal limits will improve Outcome: Progressing Goal: Will remain free from infection Outcome: Progressing Goal: Diagnostic test results will improve Outcome: Progressing Goal: Respiratory complications will improve Outcome: Progressing   Problem: Education: Goal: Knowledge of General Education information will improve Description: Including pain rating scale, medication(s)/side effects and non-pharmacologic comfort measures Outcome: Progressing

## 2020-04-12 NOTE — Progress Notes (Signed)
Physical Therapy Treatment Patient Details Name: Madison Burns MRN: [id number] DOB: 04-Aug-1950 Today's Date: 04/12/2020    History of Present Illness Pt is a 70 y.o. female  that was admitted 04/06/2020 with sepsis due to left basilar pneumonia after presenting from home complaining of shortness of breath. Pt also noteded to be in afib with RVR this admission. PMH of of myasthenia gravis, cognitive impairment, hyperlipidemia, GERD, hypothyroidism, irritable bowel syndrome, OSA on CPAP, history of seizure disorder, history of kidney cancer, abdominal hysterectomy in 1990, cholecystectomy in 1999.    PT Comments    Pt was long sitting in bed with 2 L o2 [place] + supportive sister/caregiver at bedside. Pt is agreeable to OOB activity.Attempted wean from O2 however pt dropped during session to 86% and needed to return to 2 L [place] to maintain >90%. She was easily able to exit R side of bed. Is incontinent of urinate. Required some assistance with hygiene care prior to ambulating 200 ft with very fast cadence. Constant vcs for slowing down. No LOB or unsteadiness. Per sister, she tends to ambulate that fast all the time. Overall tolerated session well and is progressing towards all acute PT goals. Recommend DC to home with HHPT to follow. At conclusion of PT session, OT in room for session.      Follow Up Recommendations  Home health PT;Supervision/Assistance - 24 hour     Equipment Recommendations  None recommended by PT    Recommendations for Other Services       Precautions / Restrictions Precautions Precautions: Fall Restrictions Weight Bearing Restrictions: No    Mobility  Bed Mobility Overal bed mobility: Modified Independent     Transfers Overall transfer level: Needs assistance Equipment used: None Transfers: Sit to/from Stand Sit to Stand: Supervision         General transfer comment: Did issue gait belt to pt's sister for home use  Ambulation/Gait Ambulation/Gait  assistance: Min guard Gait Distance (Feet): 200 Feet Assistive device: None Gait Pattern/deviations: Trunk flexed Gait velocity: decreased   General Gait Details: Pt tends to AMbulate with very fast cadence. per sister this is normal for her. HR did elevated to 133 with desaturation to 86% on rm air. does maintain > 90 % on 2 L o2       Balance Overall balance assessment: Needs assistance Sitting-balance support: Feet supported Sitting balance-Leahy Scale: Good     Standing balance support: No upper extremity supported Standing balance-Leahy Scale: Good         Cognition Arousal/Alertness: Awake/alert Behavior During Therapy: WFL for tasks assessed/performed Overall Cognitive Status: History of cognitive impairments - at baseline        General Comments: sister at bedside confirms pt is at baseline cognition             Pertinent Vitals/Pain Pain Assessment: No/denies pain Faces Pain Scale: No hurt           PT Goals (current goals can now be found in the care plan section) Acute Rehab PT Goals Patient Stated Goal: to go home Progress towards PT goals: Progressing toward goals    Frequency    Min 2X/week      PT Plan Current plan remains appropriate       AM-PAC PT "6 Clicks" Mobility   Outcome Measure  Help needed turning from your back to your side while in a flat bed without using bedrails?: None Help needed moving from lying on your back to sitting on the side  of a flat bed without using bedrails?: None Help needed moving to and from a bed to a chair (including a wheelchair)?: None Help needed standing up from a chair using your arms (e.g., wheelchair or bedside chair)?: None Help needed to walk in hospital room?: A Little Help needed climbing 3-5 steps with a railing? : A Little 6 Click Score: 22    End of Session Equipment Utilized During Treatment: Gait belt Activity Tolerance: Patient tolerated treatment well Patient left: in bed;with  call bell/phone within reach;with bed alarm set Nurse Communication: Mobility status PT Visit Diagnosis: Other abnormalities of gait and mobility (R26.89)     Time: 1420-1443 PT Time Calculation (min) (ACUTE ONLY): 23 min  Charges:  $Gait Training: 8-22 mins $Therapeutic Activity: 8-22 mins                     Julaine Fusi PTA 04/12/20, 4:02 PM

## 2020-04-12 NOTE — Progress Notes (Signed)
Madison Burns  GGE:[id number] DOB: December 15, 1950 DOA: 04/06/2020 PCP: Einar Pheasant, MD    Brief Narrative:  70 year old with a history of myasthenia gravis, cognitive impairment, hypothyroidism, sleep apnea on nocturnal CPAP, HTN, and HLD who was admitted to [facility] with sepsis due to a left basilar community-acquired pneumonia, and subsequently developed a MG flair as well as transient afib w/ RVR.   Antimicrobials:  Cefepime 2/18 > 2/22 Vancomycin 2/18 > 2/20  DVT prophylaxis: lovenox   Subjective: Afebrile.  Vital signs stable.  Maintaining normal sinus rhythm.  Saturations 98% on 2-3 L nasal cannula support at this time.  No new complaints.  Feels weak in general.  Assessment & Plan:  Community-acquired pneumonia with sepsis POA -acute hypoxic respiratory failure Has completed 6 days of IV antibiotic - still on 3 L nasal cannula and does not usually require oxygen support at home other than nightly CPAP - continue w/ attempts to wean to RA   Newly diagnosed atrial fibrillation with RVR Cardiology following and placed patient on oral amiodarone -patient quickly converted back to sinus rhythm and has maintained this therefore anticoagulation has not been initiated  Acute exacerbation of myasthenia gravis Neurology following - IVIG course completed - NIF being followed but her ability to participate and provide accurate readings is questionable - [place]inon continues - appears to be improving consistently   Hyponatremia Resolved  Hypothyroidism Continue usual Synthroid dose  Obesity  Sleep apnea Continue usual home CPAP   Code Status: FULL CODE Family Communication:  Status is: Inpatient  Remains inpatient appropriate because:Inpatient level of care appropriate due to severity of illness   Dispo: The patient is from: Home              Anticipated d/c is to: Home              Anticipated d/c date is: 1 day              Patient currently is not medically stable to  d/c.   Difficult to place patient No   Consultants:  Neurology Cardiology  Objective: Blood pressure 124/71, pulse 94, temperature 97.9 F (36.6 C), temperature source Oral, resp. rate 18, height 5\' 3"  (1.6 m), weight 89.8 kg, SpO2 93 %.  Intake/Output Summary (Last 24 hours) at 04/12/2020 0851 Last data filed at 04/12/2020 4650 [place] per 24 hour  Intake 480 ml  Output 2900 ml  Net -2420 ml   Filed Weights   04/07/20 1635 04/10/20 0500  Weight: 89.8 kg 89.8 kg    Examination: General: No acute respiratory distress Lungs: CTA B - no wheezing  Cardiovascular: RRR - no M or rub  Abdomen: NT/ND, soft, bs+, no mass  Extremities: No C/C/E B LE   CBC: Recent Labs  Lab 04/06/20 1124 04/07/20 0535 04/08/20 0413 04/09/20 0456 04/10/20 0422  WBC 17.3* 12.7* 11.9* 13.4* 9.6  NEUTROABS 15.2* 10.1*  --   --   --   HGB 11.2* 10.5* 10.8* 10.2* 10.5*  HCT 34.1* 31.9* 32.8* 30.6* 32.7*  MCV 92.4 93.3 91.9 92.2 95.6  PLT 224 212 223 230 354   Basic Metabolic Panel: Recent Labs  Lab 04/07/20 0535 04/08/20 0413 04/09/20 0456 04/10/20 0422  NA 125* 124* 130* 135  K 4.1 4.1 4.9 4.4  CL 92* 94* 99 103  CO2 23 22 25 27   GLUCOSE 135* 143* 145* 106*  BUN 20 21 20 21   CREATININE 0.78 0.81 0.76 0.78  CALCIUM 8.5* 8.4* 8.3* 8.2*  PHOS 3.7  --   --   --    GFR: Estimated Creatinine Clearance: 71.6 mL/min (by C-G formula based on SCr of 0.78 mg/dL).  Liver Function Tests: Recent Labs  Lab 04/06/20 1124 04/07/20 0535  AST 28 20  ALT 29 24  ALKPHOS 66 58  BILITOT 0.8 0.9  PROT 6.3* 6.1*  ALBUMIN 3.1* 2.7*    HbA1C: Hgb A1c MFr Bld  Date/Time Value Ref Range Status  12/14/2018 08:57 AM 5.4 4.6 - 6.5 % Final    Comment:    Glycemic Control Guidelines for People with Diabetes:Non Diabetic:  <6%Goal of Therapy: <7%Additional Action Suggested:  >8%   08/06/2018 10:07 AM 5.5 4.6 - 6.5 % Final    Comment:    Glycemic Control Guidelines for People with Diabetes:Non  Diabetic:  <6%Goal of Therapy: <7%Additional Action Suggested:  >8%      Recent Results (from the past 240 hour(s))  MRSA PCR Screening     Status: None   Collection Time: 04/06/20  9:50 AM   Specimen: Nasopharyngeal  Result Value Ref Range Status   MRSA by PCR NEGATIVE NEGATIVE Final    Comment:        The GeneXpert MRSA Assay (FDA approved for NASAL specimens only), is one component of a comprehensive MRSA colonization surveillance program. It is not intended to diagnose MRSA infection nor to guide or monitor treatment for MRSA infections. Performed at [facility], [address] [place], [place] [address]   Blood culture (routine x 2)     Status: None   Collection Time: 04/06/20  1:50 PM   Specimen: BLOOD  Result Value Ref Range Status   Specimen Description BLOOD LEFT ANTECUBITAL  Final   Special Requests   Final    BOTTLES DRAWN AEROBIC AND ANAEROBIC Blood Culture results may not be optimal due to an inadequate volume of blood received in culture bottles   Culture   Final    NO GROWTH 5 DAYS Performed at [facility], [address] [place], [place] [address]    Report Status 04/11/2020 FINAL  Final  Blood culture (routine x 2)     Status: None   Collection Time: 04/06/20  1:50 PM   Specimen: BLOOD  Result Value Ref Range Status   Specimen Description BLOOD RIGHT ANTECUBITAL  Final   Special Requests   Final    BOTTLES DRAWN AEROBIC AND ANAEROBIC Blood Culture results may not be optimal due to an inadequate volume of blood received in culture bottles   Culture   Final    NO GROWTH 5 DAYS Performed at [facility], [address] [place], [place] [address]    Report Status 04/11/2020 FINAL  Final  Resp Panel by RT-PCR (Flu A&B, Covid) Nasopharyngeal Swab     Status: None   Collection Time: 04/06/20  1:50 PM   Specimen: Nasopharyngeal Swab; Nasopharyngeal(NP) swabs in vial transport medium  Result Value Ref Range Status   SARS  Coronavirus 2 by RT PCR NEGATIVE NEGATIVE Final    Comment: (NOTE) SARS-CoV-2 target nucleic acids are NOT DETECTED.  The SARS-CoV-2 RNA is generally detectable in upper respiratory specimens during the acute phase of infection. The lowest concentration of SARS-CoV-2 viral copies this assay can detect is 138 copies/mL. A negative result does not preclude SARS-Cov-2 infection and should not be used as the sole basis for treatment or other patient management decisions. A negative result may occur with  improper specimen collection/handling, submission  of specimen other than nasopharyngeal swab, presence of viral mutation(s) within the areas targeted by this assay, and inadequate number of viral copies(<138 copies/mL). A negative result must be combined with clinical observations, patient history, and epidemiological information. The expected result is Negative.  Fact Sheet for Patients:  EntrepreneurPulse.com.au  Fact Sheet for Healthcare Providers:  IncredibleEmployment.be  This test is no t yet approved or cleared by the [place] FDA and  has been authorized for detection and/or diagnosis of SARS-CoV-2 by FDA under an Emergency Use Authorization (EUA). This EUA will remain  in effect (meaning this test can be used) for the duration of the COVID-19 declaration under Section 564(b)(1) of the Act, 21 U.S.C.section 360bbb-3(b)(1), unless the authorization is terminated  or revoked sooner.       Influenza A by PCR NEGATIVE NEGATIVE Final   Influenza B by PCR NEGATIVE NEGATIVE Final    Comment: (NOTE) The Xpert Xpress SARS-CoV-2/FLU/RSV plus assay is intended as an aid in the diagnosis of influenza from Nasopharyngeal swab specimens and should not be used as a sole basis for treatment. Nasal washings and aspirates are unacceptable for Xpert Xpress SARS-CoV-2/FLU/RSV testing.  Fact Sheet for  Patients: EntrepreneurPulse.com.au  Fact Sheet for Healthcare Providers: IncredibleEmployment.be  This test is not yet approved or cleared by the [place] FDA and has been authorized for detection and/or diagnosis of SARS-CoV-2 by FDA under an Emergency Use Authorization (EUA). This EUA will remain in effect (meaning this test can be used) for the duration of the COVID-19 declaration under Section 564(b)(1) of the Act, 21 U.S.C. section 360bbb-3(b)(1), unless the authorization is terminated or revoked.  Performed at [facility], [address] [place], [place] [address]      Scheduled Meds: . amiodarone  200 mg Oral BID  . DULoxetine  30 mg Oral Daily  . enoxaparin (LOVENOX) injection  0.5 mg/kg Subcutaneous Q24H  . fesoterodine  4 mg Oral Daily  . ipratropium-albuterol  3 mL Nebulization TID  . levothyroxine  75 mcg Oral QAC breakfast  . methotrexate  10 mg Oral Weekly  . pantoprazole  40 mg Oral Daily  . pyridostigmine  60 mg Oral TID  . risperiDONE  1 mg Oral QHS      LOS: 6 days   Cherene Altes, MD [facility] Office  [phone] Pager - Text Page per Amion  If 7PM-7AM, please contact night-coverage per Amion 04/12/2020, 8:51 AM

## 2020-04-12 NOTE — Progress Notes (Signed)
Pt NIF -20. Pt unable to do VC.

## 2020-04-12 NOTE — TOC Progression Note (Signed)
Transition of Care [facility]) - Progression Note    Patient Details  Name: Madison Burns MRN: [id number] Date of Birth: 1950/05/17  Transition of Care [facility]) CM/SW Contact  Shelbie Hutching, RN Phone Number: 04/12/2020, 1:48 PM  Clinical Narrative:    RNCM met with patient and patient's sister at the bedside this morning, patient is still on [place] 3L and may need home O2 at discharge.  [facility] updated sister that Development worker, community with [employer] is assisting with Medicaid application.     Expected Discharge Plan: [place] Barriers to Discharge: Continued Medical Work up  Expected Discharge Plan and Services Expected Discharge Plan: [place]   Discharge Planning Services: CM Consult Post Acute Care Choice: [place] arrangements for the past 2 months: Single Family Home                 DME Arranged: N/A DME Agency: NA       HH Arranged: RN,PT,OT,Social Work [employer] Agency: [facility] Date [place]: 04/10/20 Time [place]: 1350 Representative spoke with at [place]: [place] ([place]) Interventions    Readmission Risk Interventions Readmission Risk Prevention Plan 04/09/2020  Transportation Screening Complete  PCP or Specialist Appt within 3-5 Days Complete  HRI or [place] Complete  Social Work Consult for [place] Planning/Counseling Complete  Palliative Care Screening Not Applicable  Medication Review Press photographer) Complete  Some recent data might be hidden

## 2020-04-13 DIAGNOSIS — F73 Profound intellectual disabilities: Secondary | ICD-10-CM

## 2020-04-13 LAB — MISC LABCORP TEST (SEND OUT): Labcorp test code: 140640

## 2020-04-13 LAB — VGCC ANTIBODY: VGCC Antibody: NEGATIVE

## 2020-04-13 MED ORDER — ACETAMINOPHEN 325 MG PO TABS: 650.0000 mg | ORAL_TABLET | Freq: Four times a day (QID) | ORAL | Status: AC | PRN

## 2020-04-13 MED ORDER — AMIODARONE HCL 200 MG PO TABS: 200.0000 mg | ORAL_TABLET | Freq: Every day | ORAL | 0 refills | Status: AC

## 2020-04-13 MED ORDER — AMIODARONE HCL 200 MG PO TABS
200.0000 mg | ORAL_TABLET | Freq: Every day | ORAL | Status: DC
  Administered 2020-04-13: 200 mg via ORAL
  Filled 2020-04-13: qty 1

## 2020-04-13 MED ORDER — PYRIDOSTIGMINE BROMIDE 60 MG PO TABS: 60.0000 mg | ORAL_TABLET | Freq: Three times a day (TID) | ORAL | Status: DC

## 2020-04-13 NOTE — Plan of Care (Signed)
End of Shift Summary:  A&O x4, mentation at baseline. VSS, on 2L via [place] or CPAP overnight, saturations >96%. Pain managed with prn medications. Denies n/v. Urine output adequate via purewick or BSC. (+) BM, (+) flatus. Pt up with a standby assist. Sister [name] at bedside overnight. Remained free from falls or injury. Bed low and in locked position, call bell within reach and able to use.   Problem: Education: Goal: Knowledge of General Education information will improve Description: Including pain rating scale, medication(s)/side effects and non-pharmacologic comfort measures Outcome: Progressing   Problem: Health Behavior/Discharge Planning: Goal: Ability to manage health-related needs will improve Outcome: Progressing   Problem: Clinical Measurements: Goal: Ability to maintain clinical measurements within normal limits will improve Outcome: Progressing Goal: Will remain free from infection Outcome: Progressing Goal: Diagnostic test results will improve Outcome: Progressing Goal: Respiratory complications will improve Outcome: Progressing Goal: Cardiovascular complication will be avoided Outcome: Progressing   Problem: Activity: Goal: Risk for activity intolerance will decrease Outcome: Progressing   Problem: Pain Managment: Goal: General experience of comfort will improve Outcome: Progressing   Problem: Skin Integrity: Goal: Risk for impaired skin integrity will decrease Outcome: Progressing

## 2020-04-13 NOTE — Progress Notes (Signed)
SATURATION QUALIFICATIONS: (This note is used to comply with regulatory documentation for home oxygen)  Patient Saturations on Room Air at Rest = 91%  Patient Saturations on Room Air while Ambulating = 87%  Patient Saturations on  Liters of oxygen while Ambulating = %   Patient dropped to 87% on room air while ambulating but immediately recovered to 94% on room air.

## 2020-04-13 NOTE — Progress Notes (Signed)
Patient Name: Madison Burns Date of Encounter: 04/13/2020  Hospital Problem List     Principal Problem:   CAP (community acquired pneumonia) Active Problems:   Hypertension   Myasthenia gravis ([place])   MG with exacerbation (myasthenia gravis) ([place])   Hypothyroidism   Shortness of breath   Acute hyponatremia    Patient Profile      70 year old female with a past medical history significant for myasthenia gravis, cognitive impairment, and hyperlipidemia who presented to the ED on 04/06/20 for a two day history of increased shortness of breath with an associated cough and weakness. She was admitted for acute hypoxic respiratory failure, requiring BiPAP, thought to be secondary to pneumonia. Neurology was consulted for MG exacerbation as well. While admitted, she was noted to be in new onset atrial fibrillation with RVR which warranted cardiac consultation.  Most recent echocardiogram on 10/24/19 revealed normal RV and LV systolic function with an EF estimated between 70-75% with no evidence of significant valvular disease.   Subjective   Feels better. Off of oxygen. Heart rate in good control on amiodarone  Inpatient Medications    . amiodarone  200 mg Oral Daily  . DULoxetine  30 mg Oral Daily  . enoxaparin (LOVENOX) injection  0.5 mg/kg Subcutaneous Q24H  . fesoterodine  4 mg Oral Daily  . ipratropium-albuterol  3 mL Nebulization TID  . levothyroxine  75 mcg Oral QAC breakfast  . methotrexate  10 mg Oral Weekly  . pantoprazole  40 mg Oral Daily  . pyridostigmine  60 mg Oral TID  . risperiDONE  1 mg Oral QHS    Vital Signs    Vitals:   04/12/20 2008 04/12/20 2317 04/13/20 0433 04/13/20 0729  BP:  128/84 (!) 135/95 126/81  Pulse:  97 94 99  Resp:  16 18 20   Temp:  98.5 F (36.9 C) 97.7 F (36.5 C) 98.1 F (36.7 C)  TempSrc:   Oral Oral  SpO2: 96% 95% 97% 93%  Weight:      Height:        Intake/Output Summary (Last 24 hours) at 04/13/2020 0739 Last data filed  at 04/13/2020 0430 [place] per 24 hour  Intake 240 ml  Output 4370 ml  Net -4130 ml   Filed Weights   04/07/20 1635 04/10/20 0500  Weight: 89.8 kg 89.8 kg    Physical Exam    GEN: Well nourished, well developed, in no acute distress.  HEENT: normal.  Neck: Supple, no JVD, carotid bruits, or masses. Cardiac: RRR, no murmurs, rubs, or gallops. No clubbing, cyanosis, edema.  Radials/DP/PT 2+ and equal bilaterally.  Respiratory:  Respirations regular and unlabored, clear to auscultation bilaterally. GI: Soft, nontender, nondistended, BS + x 4. MS: no deformity or atrophy. Skin: warm and dry, no rash. Neuro:  Strength and sensation are intact. Psych: Normal affect.  Labs    CBC No results for input(s): WBC, NEUTROABS, HGB, HCT, MCV, PLT in the last 72 hours. Basic Metabolic Panel No results for input(s): NA, K, CL, CO2, GLUCOSE, BUN, CREATININE, CALCIUM, MG, PHOS in the last 72 hours. Liver Function Tests No results for input(s): AST, ALT, ALKPHOS, BILITOT, PROT, ALBUMIN in the last 72 hours. No results for input(s): LIPASE, AMYLASE in the last 72 hours. Cardiac Enzymes No results for input(s): CKTOTAL, CKMB, CKMBINDEX, TROPONINI in the last 72 hours. BNP No results for input(s): BNP in the last 72 hours. D-Dimer No results for input(s): DDIMER in the last 72  hours. Hemoglobin A1C No results for input(s): HGBA1C in the last 72 hours. Fasting Lipid Panel No results for input(s): CHOL, HDL, LDLCALC, TRIG, CHOLHDL, LDLDIRECT in the last 72 hours. Thyroid Function Tests No results for input(s): TSH, T4TOTAL, T3FREE, THYROIDAB in the last 72 hours.  Invalid input(s): FREET3  Telemetry      ECG    afib with rvr , now nsr with no ischemia  Radiology    DG Chest 2 View  Result Date: 04/06/2020 CLINICAL DATA:  Shortness of breath.  Chest pain. EXAM: CHEST - 2 VIEW COMPARISON:  November 04, 2019. FINDINGS: Mild cardiomegaly is noted with central pulmonary vascular  congestion. No pneumothorax is noted. Left basilar atelectasis is noted with small left pleural effusion. Stable elevated left hemidiaphragm. Bony thorax is unremarkable. IMPRESSION: Stable elevated left hemidiaphragm. Left basilar atelectasis with small left pleural effusion. Electronically Signed   By: Marijo Conception M.D.   On: 04/06/2020 11:56   MR ABDOMEN WWO CONTRAST  Result Date: 03/23/2020 CLINICAL DATA:  Status post renal cryoablation EXAM: MRI ABDOMEN WITHOUT AND WITH CONTRAST TECHNIQUE: Multiplanar multisequence MR imaging of the abdomen was performed both before and after the administration of intravenous contrast. CONTRAST:  7.55mL GADAVIST GADOBUTROL 1 MMOL/ML IV SOLN COMPARISON:  CT abdomen dated 06/07/2019 FINDINGS: Lower chest: Small bilateral pleural effusions, left greater than right. Associated left lower lobe opacity, likely atelectasis. Hepatobiliary: Liver is within normal limits. Status post cholecystectomy. No intrahepatic or extrahepatic ductal dilatation. Pancreas:  Within normal limits. Spleen:  Within normal limits. Adrenals/Urinary Tract: Mild low-density thickening of the bilateral adrenal glands, left greater than right. Postprocedural changes related to left lower pole renal cryoablation. Cryoablation zone measures approximately 2.5 x 2.9 x 3.1 cm. Mild intrinsic T1 hyperintensity (series 15/image 67) reflects coagulative necrosis. No residual enhancement on postcontrast subtraction imaging to suggest viable tumor. Additional small bilateral renal cysts, including a 2.2 cm left renal sinus cyst (series 5/image 28). No enhancing renal lesions. No hydronephrosis. Stomach/Bowel: Stomach and visualized bowel are grossly unremarkable. Vascular/Lymphatic:  No evidence of abdominal aortic aneurysm. No suspicious abdominal lymphadenopathy. Other:  No abdominal ascites. Musculoskeletal: No focal osseous lesions. IMPRESSION: Postprocedural changes related to left lower pole renal  cryoablation. No evidence of viable tumor. Small bilateral pleural effusions, left greater than right. Associated left lower lobe opacity, likely atelectasis. Electronically Signed   By: Julian Hy M.D.   On: 03/23/2020 11:21   US Venous Img Lower Bilateral (DVT)  Result Date: 04/07/2020 CLINICAL DATA:  Swelling. EXAM: BILATERAL LOWER EXTREMITY VENOUS DOPPLER ULTRASOUND TECHNIQUE: Gray-scale sonography with compression, as well as color and duplex ultrasound, were performed to evaluate the deep venous system(s) from the level of the common femoral vein through the popliteal and proximal calf veins. COMPARISON:  None. FINDINGS: VENOUS Normal compressibility of the common femoral, superficial femoral, and popliteal veins, as well as the visualized calf veins. Visualized portions of profunda femoral vein and great saphenous vein unremarkable. No filling defects to suggest DVT on grayscale or color Doppler imaging. Doppler waveforms show normal direction of venous flow, normal respiratory plasticity and response to augmentation. Limited views of the contralateral common femoral vein are unremarkable. OTHER None. Limitations: none IMPRESSION: Negative. Electronically Signed   By: Dorise Bullion III M.D   On: 04/07/2020 09:51   DG Chest Port 1 View  Result Date: 04/08/2020 CLINICAL DATA:  Pneumonia.  Shortness of breath.  Dysphagia. EXAM: PORTABLE CHEST 1 VIEW COMPARISON:  April 06, 2020 FINDINGS: Effusion and  opacity in left base is similar in the interval. Subtle patchy opacities in the right upper and lower lung. Stable cardiomegaly. No other interval changes. IMPRESSION: 1. Effusion with underlying opacity in left base is similar in the interval. 2. Suspected subtle infiltrates in the right upper and lower lobes, not seen previously. Recommend attention on follow-up. Electronically Signed   By: Dorise Bullion III M.D   On: 04/08/2020 12:44   IR Radiologist Eval & Mgmt  Result Date:  03/28/2020 Please refer to notes tab for details about interventional procedure. (Op Note)   Assessment & Plan       1. New onset atrial fibrillation  -Patient currently in sinus rhythm.  Was on diltiazem but discontinued secondary to MG flare.  Placed on amiodarone at 200 mg twice daily and tolerating this well.  We will reduce to amiodarone 200 mg daily. Would discharge on this dose. Will see after discharge in 1 week. I have made appt. Appears ready for discharge fro cardiac standpoint. NOt a cnadidate for anticoagulation at present.   Will sign off. Dr. Clayborn Bigness will be covering for our group this weekend. If there are any questions.    Signed, Javier Docker Fath MD 04/13/2020, 7:39 AM  Pager: [phone]

## 2020-04-13 NOTE — TOC Transition Note (Signed)
Transition of Care [facility]) - CM/SW Discharge Note   Patient Details  Name: Madison Burns MRN: [id number] Date of Birth: 02-15-1951  [facility]) CM/SW Contact:  Shelbie Hutching, RN Phone Number: 04/13/2020, 10:53 AM   Clinical Narrative:    Patient medically cleared for discharge home with home health services and her sister.  Patient's sister Madison Burns is at the bedside.  Home Health services arranged with [facility] for RN, PT, and OT.   Patient's sister will transport her home today.  Consult has been placed for [facility] case management to follow her outpatient and financial counselor here at [facility] working on [employer] application.    Final next level of care: [place] Barriers to Discharge: No Barriers Identified   Patient Goals and CMS Choice Patient states their goals for this hospitalization and ongoing recovery are:: to go home with home health services. CMS Medicare.gov Compare Post Acute Care list provided to:: Patient Represenative (must comment) Choice offered to / list presented to : Sibling  Discharge Placement                       Discharge Plan and Services   Discharge Planning Services: CM Consult Post Acute Care Choice: Home Health          DME Arranged: N/A DME Agency: NA       HH Arranged: RN,PT,OT,Social Work [employer] Agency: [facility] Date [place] Agency Contacted: 04/13/20 Time [place]: 0045 Representative spoke with at [place]: [place] ([place]) Interventions     Readmission Risk Interventions Readmission Risk Prevention Plan 04/09/2020  Transportation Screening Complete  PCP or Specialist Appt within 3-5 Days Complete  HRI or [place] Complete  Social Work Consult for [place] Planning/Counseling Complete  Palliative Care Screening Not Applicable  Medication Review Press photographer) Complete  Some recent data might be hidden

## 2020-04-13 NOTE — Progress Notes (Signed)
NIF -21

## 2020-04-13 NOTE — Discharge Summary (Signed)
DISCHARGE SUMMARY  Madison Burns  MR#: [id number]  DOB:1950/04/16  Date of Admission: 04/06/2020 Date of Discharge: 04/13/2020  Attending Physician:Jeffrey Hennie Duos, MD  Burns's CBS:[id number], Madison Patient, MD  Consults: Neurology Cardiology  Disposition: D/C home w/ sister  Follow-up Appts:  Follow-up Information    Teodoro Spray, MD Follow up in 1 week(s).   Specialty: Cardiology Contact information: [place] [address] [place], [place] Follow up.   Specialty: Home Health Services Why: You have been set up with home health care through [facility].  If you have questions reach out to [place] with [facility] at [phone]. Contact information: [place] [address] [phone]               Tests Needing Follow-up: -assess atrial fib/HR and determine if amiodarone needs to continue  Discharge Diagnoses: Community-acquired pneumonia Sepsis POA Acute hypoxic respiratory failure Newly diagnosed transient atrial fibrillation with RVR Acute exacerbation of myasthenia gravis Hyponatremia Hypothyroidism Obesity Sleep apnea  Initial presentation: 70yo with a history of myasthenia gravis, cognitive impairment, hypothyroidism, sleep apnea on nocturnal CPAP, HTN, and HLD who was admitted to [facility] with sepsis due to a left basilar community-acquired pneumonia, and subsequently developed a MG flair as well as transient afib w/ RVR.   Hospital Course:  Community-acquired pneumonia with sepsis POA -acute hypoxic respiratory failure completed 6 days of IV antibiotics - was able to be weaned to room air with saturations at 90% or greater prior to her discharge   Newly diagnosed atrial fibrillation with RVR Cardiology followed and placed Burns on oral amiodarone - Burns quickly converted back to sinus rhythm and has maintained this therefore anticoagulation has not been  initiated - to f/u w/ Cards as outpt   Acute exacerbation of myasthenia gravis Neurology followed - IVIG course completed - NIF followed but her ability to participate and provide accurate readings was questionable - Mestinon continues - appears to be improving consistently w/ pt able to ambulate freely prior to d/c and with improved respiratory mechanics/successful weaning to RA   Hyponatremia Resolved  Hypothyroidism Continue usual Synthroid dose  Obesity  Sleep apnea Continue usual home CPAP  Allergies as of 04/13/2020   No Known Allergies     Medication List    STOP taking these medications   diltiazem 180 MG 24 hr capsule Commonly known as: CARDIZEM CD   predniSONE 10 MG tablet Commonly known as: DELTASONE   sucralfate 1 g tablet Commonly known as: CARAFATE     TAKE these medications   acetaminophen 325 MG tablet Commonly known as: TYLENOL Take 2 tablets (650 mg total) by mouth every 6 (six) hours as needed for mild pain (or Fever >/= 101).   amiodarone 200 MG tablet Commonly known as: PACERONE Take 1 tablet (200 mg total) by mouth daily. Start taking on: April 14, 2020   B-complex with vitamin C tablet Take 1 tablet by mouth every evening.   Biotin 10000 MCG Tabs Take 10,000 mcg by mouth daily at 2 PM. (1430)   CITRACAL +D3 PO Take 1 tablet by mouth daily.   DULoxetine 30 MG capsule Commonly known as: CYMBALTA TAKE 1 CAPSULE BY MOUTH ONCE DAILY   folic acid 1 MG tablet Commonly known as: FOLVITE 2 mg every day po   Garlic 0762 MG Caps Take 1,000 mg by mouth in the morning, at noon, and at bedtime. (0800,  1430, 2000)   levothyroxine 75 MCG tablet Commonly known as: SYNTHROID TAKE 1 TABLET BY MOUTH DAILY ON AN EMPTYSTOMACH. WAIT 30 MINUTES BEFORE TAKING OTHER MEDS What changed: See the new instructions.   methotrexate 2.5 MG tablet TAKE 4 TABLETS BY MOUTH BY MOUTH ONCE A WEEK WITH FOLIC ACID What changed:   how much to take  how to  take this  when to take this   pantoprazole 40 MG tablet Commonly known as: PROTONIX Take 40 mg by mouth daily.   PROBIOTIC MULTI-ENZYME PO Take 1 capsule by mouth in the morning and at bedtime. Probiotic Multi-Enzyme Digestive Formula   pyridostigmine 60 MG tablet Commonly known as: MESTINON Take 1 tablet (60 mg total) by mouth 3 (three) times daily. TAKE 1 TABLET BY MOUTH AT  8 AM, ONE AT LUNCH AND ONE AT 8PM   risperiDONE 1 MG tablet Commonly known as: RISPERDAL Take 1 mg by mouth at bedtime.   tolterodine 4 MG 24 hr capsule Commonly known as: DETROL LA TAKE 1 CAPSULE BY MOUTH ONCE EVERY MORNING What changed: See the new instructions.   vitamin C 1000 MG tablet Take 1,000 mg by mouth at bedtime.   vitamin E 180 MG (400 UNITS) capsule Take 400 Units by mouth daily at 2 PM. 1430       Day of Discharge BP 126/81 (BP Location: Left Arm)   Pulse 98   Temp 98.1 F (36.7 C) (Oral)   Resp 20   Ht 5\' 3"  (1.6 m)   Wt 89.8 kg   SpO2 91%   BMI 35.07 kg/m   Physical Exam: General: No acute respiratory distress Lungs: Clear to auscultation bilaterally without wheezes or crackles Cardiovascular: Regular rate and rhythm without murmur gallop or rub normal S1 and S2 Abdomen: Nontender, nondistended, soft, bowel sounds positive, no rebound, no ascites, no appreciable mass Extremities: No significant cyanosis, clubbing, or edema bilateral lower extremities  Basic Metabolic Panel: Recent Labs  Lab 04/07/20 0535 04/08/20 0413 04/09/20 0456 04/10/20 0422  NA 125* 124* 130* 135  K 4.1 4.1 4.9 4.4  CL 92* 94* 99 103  CO2 23 22 25 27   GLUCOSE 135* 143* 145* 106*  BUN 20 21 20 21   CREATININE 0.78 0.81 0.76 0.78  CALCIUM 8.5* 8.4* 8.3* 8.2*  PHOS 3.7  --   --   --     Liver Function Tests: Recent Labs  Lab 04/07/20 0535  AST 20  ALT 24  ALKPHOS 58  BILITOT 0.9  PROT 6.1*  ALBUMIN 2.7*    CBC: Recent Labs  Lab 04/07/20 0535 04/08/20 0413 04/09/20 0456  04/10/20 0422  WBC 12.7* 11.9* 13.4* 9.6  NEUTROABS 10.1*  --   --   --   HGB 10.5* 10.8* 10.2* 10.5*  HCT 31.9* 32.8* 30.6* 32.7*  MCV 93.3 91.9 92.2 95.6  PLT 212 223 230 252    BNP (last 3 results) Recent Labs    10/21/19 2112 10/28/19 0614  BNP 46.4 33.9     Recent Results (from the past 240 hour(s))  MRSA PCR Screening     Status: None   Collection Time: 04/06/20  9:50 AM   Specimen: Nasopharyngeal  Result Value Ref Range Status   MRSA by PCR NEGATIVE NEGATIVE Final    Comment:        The GeneXpert MRSA Assay (FDA approved for NASAL specimens only), is one component of a comprehensive MRSA colonization surveillance program. It is not intended to diagnose  MRSA infection nor to guide or monitor treatment for MRSA infections. Performed at [facility], [address] [place], [place] [address]   Blood culture (routine x 2)     Status: None   Collection Time: 04/06/20  1:50 PM   Specimen: BLOOD  Result Value Ref Range Status   Specimen Description BLOOD LEFT ANTECUBITAL  Final   Special Requests   Final    BOTTLES DRAWN AEROBIC AND ANAEROBIC Blood Culture results may not be optimal due to an inadequate volume of blood received in culture bottles   Culture   Final    NO GROWTH 5 DAYS Performed at [facility], [address] [place], [place] [address]    Report Status 04/11/2020 FINAL  Final  Blood culture (routine x 2)     Status: None   Collection Time: 04/06/20  1:50 PM   Specimen: BLOOD  Result Value Ref Range Status   Specimen Description BLOOD RIGHT ANTECUBITAL  Final   Special Requests   Final    BOTTLES DRAWN AEROBIC AND ANAEROBIC Blood Culture results may not be optimal due to an inadequate volume of blood received in culture bottles   Culture   Final    NO GROWTH 5 DAYS Performed at [facility], [address] [place], [place] [address]    Report Status 04/11/2020 FINAL  Final  Resp Panel by RT-PCR (Flu A&B,  Covid) Nasopharyngeal Swab     Status: None   Collection Time: 04/06/20  1:50 PM   Specimen: Nasopharyngeal Swab; Nasopharyngeal(NP) swabs in vial transport medium  Result Value Ref Range Status   SARS Coronavirus 2 by RT PCR NEGATIVE NEGATIVE Final    Comment: (NOTE) SARS-CoV-2 target nucleic acids are NOT DETECTED.  The SARS-CoV-2 RNA is generally detectable in upper respiratory specimens during the acute phase of infection. The lowest concentration of SARS-CoV-2 viral copies this assay can detect is 138 copies/mL. A negative result does not preclude SARS-Cov-2 infection and should not be used as the sole basis for treatment or other Burns management decisions. A negative result may occur with  improper specimen collection/handling, submission of specimen other than nasopharyngeal swab, presence of viral mutation(s) within the areas targeted by this assay, and inadequate number of viral copies(<138 copies/mL). A negative result must be combined with clinical observations, Burns history, and epidemiological information. The expected result is Negative.  Fact Sheet for Patients:  EntrepreneurPulse.com.au  Fact Sheet for Healthcare Providers:  IncredibleEmployment.be  This test is no t yet approved or cleared by the [place] FDA and  has been authorized for detection and/or diagnosis of SARS-CoV-2 by FDA under an Emergency Use Authorization (EUA). This EUA will remain  in effect (meaning this test can be used) for the duration of the COVID-19 declaration under Section 564(b)(1) of the Act, 21 U.S.C.section 360bbb-3(b)(1), unless the authorization is terminated  or revoked sooner.       Influenza A by PCR NEGATIVE NEGATIVE Final   Influenza B by PCR NEGATIVE NEGATIVE Final    Comment: (NOTE) The Xpert Xpress SARS-CoV-2/FLU/RSV plus assay is intended as an aid in the diagnosis of influenza from Nasopharyngeal swab specimens and should  not be used as a sole basis for treatment. Nasal washings and aspirates are unacceptable for Xpert Xpress SARS-CoV-2/FLU/RSV testing.  Fact Sheet for Patients: EntrepreneurPulse.com.au  Fact Sheet for Healthcare Providers: IncredibleEmployment.be  This test is not yet approved or cleared by the [place] FDA and has been authorized for detection  and/or diagnosis of SARS-CoV-2 by FDA under an Emergency Use Authorization (EUA). This EUA will remain in effect (meaning this test can be used) for the duration of the COVID-19 declaration under Section 564(b)(1) of the Act, 21 U.S.C. section 360bbb-3(b)(1), unless the authorization is terminated or revoked.  Performed at [facility], [address] [place], [place] [address]       Time spent in discharge (includes decision making & examination of pt): 35 minutes  04/13/2020, 11:57 AM   Cherene Altes, MD [facility] Office  [phone]

## 2020-04-13 NOTE — Discharge Instructions (Signed)
Community-Acquired Pneumonia, Adult Pneumonia is an infection of the lungs. It causes irritation and swelling in the airways of the lungs. Mucus and fluid may also build up inside the airways. This may cause coughing and trouble breathing. One type of pneumonia can happen while you are in a hospital. A different type can happen when you are not in a hospital (community-acquired pneumonia). What are the causes? This condition is caused by germs (viruses, bacteria, or fungi). Some types of germs can spread from person to person. Pneumonia is not thought to spread from person to person.   What increases the risk? You are more likely to develop this condition if:  You have a long-term (chronic) disease, such as: ? Disease of the lungs. This may be chronic obstructive pulmonary disease (COPD) or asthma. ? Heart failure. ? Cystic fibrosis. ? Diabetes. ? Kidney disease. ? Sickle cell disease. ? HIV.  You have other health problems, such as: ? Your body's defense system (immune system) is weak. ? A condition that may cause you to breathe in fluids from your mouth and nose.  You had your spleen taken out.  You do not take good care of your teeth and mouth (poor dental hygiene).  You use or have used tobacco products.  You travel where the germs that cause this illness are common.  You are near certain animals or the places they live.  You are older than 69 years of age. What are the signs or symptoms? Symptoms of this condition include:  A cough.  A fever.  Sweating or chills.  Chest pain, often when you breathe deeply or cough.  Breathing problems, such as: ? Fast breathing. ? Trouble breathing. ? Shortness of breath.  Feeling tired (fatigued).  Muscle aches. How is this treated? Treatment for this condition depends on many things, such as:  The cause of your illness.  Your medicines.  Your other health problems. Most adults can be treated at home. Sometimes,  treatment must happen in a hospital.  Treatment may include medicines to kill germs.  Medicines may depend on which germ caused your illness. Very bad pneumonia is rare. If you get it, you may:  Have a machine to help you breathe.  Have fluid taken away from around your lungs. Follow these instructions at home: Medicines  Take over-the-counter and prescription medicines only as told by your doctor.  Take cough medicine only if you are losing sleep. Cough medicine can keep your body from taking mucus away from your lungs.  If you were prescribed an antibiotic medicine, take it as told by your doctor. Do not stop taking the antibiotic even if you start to feel better. Lifestyle  Do not drink alcohol.  Do not use any products that contain nicotine or tobacco, such as cigarettes, e-cigarettes, and chewing tobacco. If you need help quitting, ask your doctor.  Eat a healthy diet. This includes a lot of vegetables, fruits, whole grains, low-fat dairy products, and low-fat (lean) protein.      General instructions  Rest a lot. Sleep for at least 8 hours each night.  Sleep with your head and neck raised. Put a few pillows under your head or sleep in a reclining chair.  Return to your normal activities as told by your doctor. Ask your doctor what activities are safe for you.  Drink enough fluid to keep your pee (urine) pale yellow.  If your throat is sore, rinse your mouth often with salt water. To make salt   water, dissolve -1 tsp (3-6 g) of salt in 1 cup (237 mL) of warm water.  Keep all follow-up visits as told by your doctor. This is important.   How is this prevented? You can lower your risk of pneumonia by:  Getting the pneumonia shot (vaccine). These shots have different types and schedules. Ask your doctor what works best for you. Think about getting this shot if: ? You are older than 69 years of age. ? You are 19-65 years of age and:  You are being treated for  cancer.  You have long-term lung disease.  You have other problems that affect your body's defense system. Ask your doctor if you have one of these.  Getting your flu shot every year. Ask your doctor which type of shot is best for you.  Going to the dentist as often as told.  Washing your hands often with soap and water for at least 20 seconds. If you cannot use soap and water, use hand sanitizer. Contact a doctor if:  You have a fever.  You lose sleep because your cough medicine does not help. Get help right away if:  You are short of breath and this gets worse.  You have more chest pain.  Your sickness gets worse. This is very serious if: ? You are an older adult. ? Your body's defense system is weak.  You cough up blood. These symptoms may be an emergency. Do not wait to see if the symptoms will go away. Get medical help right away. Call your local emergency services (911 in the [place] Do not drive yourself to the hospital. Summary  Pneumonia is an infection of the lungs.  Community-acquired pneumonia affects people who have not been in the hospital. Certain germs can cause this infection.  This condition may be treated with medicines that kill germs.  For very bad pneumonia, you may need a hospital stay and treatment to help with breathing. This information is not intended to replace advice given to you by your health care provider. Make sure you discuss any questions you have with your health care provider. Document Revised: 11/16/2018 Document Reviewed: 11/16/2018 Elsevier Patient Education  2021 Elsevier Inc.  

## 2020-04-13 NOTE — Progress Notes (Signed)
Discharge paperwork reviewed w/ patient and sister/caregiver. All questions answered.

## 2020-04-17 ENCOUNTER — Telehealth: Payer: Self-pay | Admitting: Oncology

## 2020-04-17 ENCOUNTER — Telehealth: Payer: Self-pay | Admitting: *Deleted

## 2020-04-17 ENCOUNTER — Telehealth: Payer: Self-pay

## 2020-04-17 NOTE — Telephone Encounter (Signed)
I rescheduled her visit with Dr. Baruch Gouty for 3/17 at 2:00 pm so patient could get results.

## 2020-04-17 NOTE — Telephone Encounter (Signed)
Please check with PCP to see if PCP would like to issue her palliative care. She recently had  MG flare and was hospitalized due to pneumonia. Her thymoma has been treated and early stage RCC was ablated. To my knowledge she does not have terminal illness from oncology aspect.

## 2020-04-17 NOTE — Telephone Encounter (Signed)
Done.. 1st avail appt for a CT scan is 05/02/20 I had Central Scheduling to sched that date for her  I Called pts sister [name] and left a detailed message on her VM Making her aware of the location, date and time of the sched appt.

## 2020-04-17 NOTE — Telephone Encounter (Signed)
Pts mom I believe is calling to set up pt for the CT and appts with Dr. Tasia Catchings that she missed due to being in the hospital.

## 2020-04-17 NOTE — Telephone Encounter (Signed)
Please r/s CT and notify pt's sister, [name], of appts. She was just seen by Dr. Tasia Catchings in Feb and follow up is scheduled in August.   Tamika/Ana, I think she is wanting to r/s appt with Dr. Baruch Gouty.

## 2020-04-17 NOTE — Telephone Encounter (Signed)
Is it ok to R/S pt CT and to get her sched to see MD after Or does pt need to RTC for a Hospital F/U 1st?   Please Advise

## 2020-04-17 NOTE — Telephone Encounter (Signed)
Transition Care Management Unsuccessful Follow-up Telephone Call  Date of discharge and from where:  04/13/20 from [facility].   Attempts:  1st Attempt  Reason for unsuccessful TCM follow-up call:  Left voice message

## 2020-04-17 NOTE — Telephone Encounter (Signed)
Pt's CT has been r/s to 3/16 and she sees Dr. Baruch Gouty on 3/17.

## 2020-04-17 NOTE — Telephone Encounter (Signed)
Patient discharging home from hospital with orders for Palliative Care in the home, Asking for orders to do this. Please return callto number listed in this note

## 2020-04-17 NOTE — Telephone Encounter (Signed)
Per LOS on 03/30/20: CT chest - next available  Lab/MD in 6 months (cbc,cmp) ... ejs   Pt was in hospital so she cancelled CT and is calling to r/s CT. Dr. Tasia Catchings did you want to do a hospital follow up? or keep appt in August as scheduled and r/s CT.

## 2020-04-18 ENCOUNTER — Other Ambulatory Visit: Payer: Self-pay | Admitting: Internal Medicine

## 2020-04-18 ENCOUNTER — Ambulatory Visit: Admission: RE | Admit: 2020-04-18 | Payer: PPO | Source: Ambulatory Visit | Admitting: Radiation Oncology

## 2020-04-18 DIAGNOSIS — R059 Cough, unspecified: Secondary | ICD-10-CM

## 2020-04-18 NOTE — Telephone Encounter (Signed)
Received a request for tessalon perles.  Is she still needing this medication.   Usually not a long term medication.  Just let me know.

## 2020-04-18 NOTE — Telephone Encounter (Signed)
Transition Care Management Not Scheduled at This time.   Date of discharge and from where:   04/13/20 from [facility] follow up declined. Currently scheduled with Cardiology. Home health in place. Notes doing extremely well and will notify if  need arises.

## 2020-04-18 NOTE — Telephone Encounter (Signed)
Call returned to Palliative Care and left message on voice mail to please contact patient PCP for this matter

## 2020-04-19 ENCOUNTER — Encounter: Payer: Self-pay | Admitting: Urology

## 2020-04-19 ENCOUNTER — Other Ambulatory Visit: Payer: Self-pay

## 2020-04-19 ENCOUNTER — Ambulatory Visit: Payer: PPO | Admitting: Urology

## 2020-04-19 VITALS — BP 160/93 | HR 91 | Ht 63.0 in | Wt 197.0 lb

## 2020-04-19 DIAGNOSIS — C642 Malignant neoplasm of left kidney, except renal pelvis: Secondary | ICD-10-CM | POA: Diagnosis not present

## 2020-04-19 DIAGNOSIS — N2889 Other specified disorders of kidney and ureter: Secondary | ICD-10-CM | POA: Diagnosis not present

## 2020-04-19 NOTE — Progress Notes (Signed)
04/19/2020 8:48 AM   Madison Burns December 31, 1950 [id number]  Referring provider: Einar Pheasant, MD [address] [place],  [place] [address]  Chief Complaint  Patient presents with  . renal mass    HPI: Extremely lovely 70 year old female who returns today for follow-up of renal mass status post cryoablation.  Initially underwent a CT abdomen pelvis with contrast on 03/09/2018 for worsening umbilical pain. This revealed an incidental possible 3 cm renal mass on the left, however there was significant artifact appreciated secondary to motion  This is followed up with an MRCP performed for the purpose of right upper quadrant pain as well as the renal mass in question. MRI performed on 02/20/2018 showed a 3.2 x 2.6 left lower pole endophytic renal mass which was felt to possibly be enhancing and consistent with possible renal cell carcinoma.  She underwent a CT biopsy on 03/28/2019.Biopsy consistent with renal cell carcinoma.   She underwent successful cryoablation on 08/17/2018 one of the left renal mass.  Postoperative course was complicated by some postoperative bleeding.  She did have a ureteral stent placed by Dr. Bess Harvest at the time due to proximity of the ureter to.  This is subsequently removed by me.  Follow-up MRI of the abdomen with and without contrast on 03/23/2020 shows postprocedural changes of the left lower pole of the kidney with no obvious viable tumor.  She denies any flank pain today.  She has no urinary symptoms.  She was recently admitted for pneumonia but has recovered nicely.  She is accompanied today by her sister.  PMH: Past Medical History:  Diagnosis Date  . Cancer of kidney ([place]) 2021  . Complication of anesthesia    Myasthenia gravis  . GERD (gastroesophageal reflux disease)   . History of seizure disorder   . Hypercholesterolemia   . Hypertension    no longer has it. wt loss  . Hypothyroidism   . Irritable bowel syndrome   .  Myasthenia gravis ([place]) 2011  . OSA on CPAP   . Skin cancer 2017   Melanoma on neck  . Thymoma, malignant [facility])     Surgical History: Past Surgical History:  Procedure Laterality Date  . ABDOMINAL HYSTERECTOMY  1990  . APPENDECTOMY  1999  . CHOLECYSTECTOMY  2013  . COLONOSCOPY WITH PROPOFOL N/A 06/30/2017   Procedure: COLONOSCOPY WITH PROPOFOL;  Surgeon: Toledo, Benay Pike, MD;  Location: [facility] ENDOSCOPY;  Service: Gastroenterology;  Laterality: N/A;  . CYSTOSCOPY W/ URETERAL STENT PLACEMENT Left 08/17/2019   Procedure: CYSTOSCOPY WITH RETROGRADE PYELOGRAM/URETERAL STENT PLACEMENT;  Surgeon: Alexis Frock, MD;  Location: [facility] ORS;  Service: Urology;  Laterality: Left;  30 MINS  . ESOPHAGOGASTRODUODENOSCOPY (EGD) WITH PROPOFOL N/A 06/30/2017   Procedure: ESOPHAGOGASTRODUODENOSCOPY (EGD) WITH PROPOFOL;  Surgeon: Toledo, Benay Pike, MD;  Location: [facility] ENDOSCOPY;  Service: Gastroenterology;  Laterality: N/A;  . IR RADIOLOGIST EVAL & MGMT  06/28/2019  . IR RADIOLOGIST EVAL & MGMT  09/14/2019  . IR RADIOLOGIST EVAL & MGMT  03/28/2020  . RADIOFREQUENCY ABLATION Left 08/17/2019   Procedure: LEFT RENAL CRYO ABLATION;  Surgeon: Jacqulynn Cadet, MD;  Location: [facility] ORS;  Service: Anesthesiology;  Laterality: Left;  . TONSILLECTOMY  1959    Home Medications:  Allergies as of 04/19/2020   No Known Allergies     Medication List       Accurate as of April 19, 2020  8:48 AM. If you have any questions, ask your nurse or doctor.  acetaminophen 325 MG tablet Commonly known as: TYLENOL Take 2 tablets (650 mg total) by mouth every 6 (six) hours as needed for mild pain (or Fever >/= 101).   amiodarone 200 MG tablet Commonly known as: PACERONE Take 1 tablet (200 mg total) by mouth daily.   B-complex with vitamin C tablet Take 1 tablet by mouth every evening.   Biotin 10000 MCG Tabs Take 10,000 mcg by mouth daily at 2 PM. (1430)   CITRACAL +D3 PO Take 1 tablet by mouth daily.   DULoxetine 30  MG capsule Commonly known as: CYMBALTA TAKE 1 CAPSULE BY MOUTH ONCE DAILY   folic acid 1 MG tablet Commonly known as: FOLVITE 2 mg every day po   Garlic 0867 MG Caps Take 1,000 mg by mouth in the morning, at noon, and at bedtime. (0800, 1430, 2000)   levothyroxine 75 MCG tablet Commonly known as: SYNTHROID TAKE 1 TABLET BY MOUTH DAILY ON AN EMPTYSTOMACH. WAIT 30 MINUTES BEFORE TAKING OTHER MEDS What changed: See the new instructions.   methotrexate 2.5 MG tablet TAKE 4 TABLETS BY MOUTH BY MOUTH ONCE A WEEK WITH FOLIC ACID What changed:   how much to take  how to take this  when to take this   pantoprazole 40 MG tablet Commonly known as: PROTONIX Take 40 mg by mouth daily.   PROBIOTIC MULTI-ENZYME PO Take 1 capsule by mouth in the morning and at bedtime. Probiotic Multi-Enzyme Digestive Formula   pyridostigmine 60 MG tablet Commonly known as: MESTINON Take 1 tablet (60 mg total) by mouth 3 (three) times daily. TAKE 1 TABLET BY MOUTH AT  8 AM, ONE AT LUNCH AND ONE AT 8PM   risperiDONE 1 MG tablet Commonly known as: RISPERDAL Take 1 mg by mouth at bedtime.   tolterodine 4 MG 24 hr capsule Commonly known as: DETROL Burns TAKE 1 CAPSULE BY MOUTH ONCE EVERY MORNING What changed: See the new instructions.   vitamin C 1000 MG tablet Take 1,000 mg by mouth at bedtime.   vitamin E 180 MG (400 UNITS) capsule Take 400 Units by mouth daily at 2 PM. 1430       Allergies: No Known Allergies  Family History: Family History  Problem Relation Age of Onset  . Colon cancer Maternal Grandfather        stomach/colon  . Heart disease Father        myocardial infarction  . Hyperlipidemia Father   . Hyperlipidemia Sister   . Diabetes Sister   . Bone cancer Other        cousin  . Alzheimer's disease Mother        maternal aunts  . Skin cancer Mother   . Myasthenia gravis Brother        ocular  . Skin cancer Brother   . Stroke Paternal Grandfather   . Breast cancer Neg  Hx     Social History:  reports that she has never smoked. She has never used smokeless tobacco. She reports that she does not drink alcohol and does not use drugs.   Physical Exam: BP (!) 160/93   Pulse 91   Ht 5\' 3"  (1.6 m)   Wt 197 lb (89.4 kg)   BMI 34.90 kg/m   Constitutional:  Alert and oriented, No acute distress. HEENT: [place] AT, moist mucus membranes.  Trachea midline, no masses. Cardiovascular: No clubbing, cyanosis, or edema. Respiratory: Normal respiratory effort, no increased work of breathing. Skin: No rashes, bruises or suspicious lesions. Neurologic:  Grossly intact, no focal deficits, moving all 4 extremities. Psychiatric: Normal mood and affect.  Laboratory Data:  Lab Results  Component Value Date   CREATININE 0.78 04/10/2020    Pertinent Imaging: Narrative & Impression  CLINICAL DATA:  Status post renal cryoablation  EXAM: MRI ABDOMEN WITHOUT AND WITH CONTRAST  TECHNIQUE: Multiplanar multisequence MR imaging of the abdomen was performed both before and after the administration of intravenous contrast.  CONTRAST:  7.110mL GADAVIST GADOBUTROL 1 MMOL/ML IV SOLN  COMPARISON:  CT abdomen dated 06/07/2019  FINDINGS: Lower chest: Small bilateral pleural effusions, left greater than right. Associated left lower lobe opacity, likely atelectasis.  Hepatobiliary: Liver is within normal limits.  Status post cholecystectomy. No intrahepatic or extrahepatic ductal dilatation.  Pancreas:  Within normal limits.  Spleen:  Within normal limits.  Adrenals/Urinary Tract: Mild low-density thickening of the bilateral adrenal glands, left greater than right.  Postprocedural changes related to left lower pole renal cryoablation. Cryoablation zone measures approximately 2.5 x 2.9 x 3.1 cm. Mild intrinsic T1 hyperintensity (series 15/image 67) reflects coagulative necrosis. No residual enhancement on postcontrast subtraction imaging to suggest viable  tumor.  Additional small bilateral renal cysts, including a 2.2 cm left renal sinus cyst (series 5/image 28). No enhancing renal lesions. No hydronephrosis.  Stomach/Bowel: Stomach and visualized bowel are grossly unremarkable.  Vascular/Lymphatic:  No evidence of abdominal aortic aneurysm.  No suspicious abdominal lymphadenopathy.  Other:  No abdominal ascites.  Musculoskeletal: No focal osseous lesions.  IMPRESSION: Postprocedural changes related to left lower pole renal cryoablation. No evidence of viable tumor.  Small bilateral pleural effusions, left greater than right. Associated left lower lobe opacity, likely atelectasis.   Electronically Signed   By: Julian Hy M.D.   On: 03/23/2020 11:21   MRI was personally reviewed today.  Agree with radiologic interpretation.  Postoperative changes but no evidence of viable tumor.  Assessment & Plan:    1. Renal cell cancer, left ([place]) Status post successful cryoablation in 7/21  No evidence of disease on most recent follow-up imaging  Discussed that she can follow-up with either interventional radiology or myself, would recommend annual basis given her additional comorbidities and no evidence of recurrence.  She like to follow-up here due to geography.  We will plan for an MRI of the abdomen with and without contrast in 1 year.  All questions answered.   - MR Abdomen W Wo Contrast; Future   Hollice Espy, MD  [facility] [address] [place], [place] [address] [phone]

## 2020-04-23 ENCOUNTER — Ambulatory Visit: Admission: RE | Admit: 2020-04-23 | Payer: PPO | Source: Ambulatory Visit

## 2020-04-25 ENCOUNTER — Other Ambulatory Visit: Payer: Self-pay | Admitting: Internal Medicine

## 2020-05-01 ENCOUNTER — Telehealth: Payer: Self-pay | Admitting: Internal Medicine

## 2020-05-01 NOTE — Telephone Encounter (Signed)
I called to schedule an appt and follow up on Home health decision. When I called to speak to the patient [name] answered. I asked how Ms. Privett was doing she informed me that her sister was doing well. I stated Madison Burns needed to follow up with Dr. Nicki Reaper and Daniels wanted to come into the home to help with her sister's care.[name] stated her sister was doing well and she did not need palliative care . However, she stated she would get in touch with [facility] to help with the patient. I explained that the provider will still need a visit with her sister [name] before filling out any forms from [facility]. She stated that would not be a problem . She also stated she could bring her into the office anytime next week except Monday. I informed [name] I would give her a call back after speaking with Tymeka's provider on how or if we could work her into the schedule next week.

## 2020-05-02 ENCOUNTER — Ambulatory Visit: Admission: RE | Admit: 2020-05-02 | Payer: PPO | Source: Ambulatory Visit

## 2020-05-02 ENCOUNTER — Other Ambulatory Visit: Payer: Self-pay

## 2020-05-02 ENCOUNTER — Ambulatory Visit
Admission: RE | Admit: 2020-05-02 | Discharge: 2020-05-02 | Disposition: A | Discharge status: Home / Self Care | Payer: PPO | Source: Ambulatory Visit | Attending: Oncology | Admitting: Oncology

## 2020-05-02 DIAGNOSIS — J9859 Other diseases of mediastinum, not elsewhere classified: Secondary | ICD-10-CM | POA: Diagnosis not present

## 2020-05-02 DIAGNOSIS — J90 Pleural effusion, not elsewhere classified: Secondary | ICD-10-CM | POA: Insufficient documentation

## 2020-05-02 DIAGNOSIS — R222 Localized swelling, mass and lump, trunk: Secondary | ICD-10-CM | POA: Diagnosis not present

## 2020-05-02 DIAGNOSIS — I313 Pericardial effusion (noninflammatory): Secondary | ICD-10-CM | POA: Diagnosis not present

## 2020-05-02 DIAGNOSIS — D4989 Neoplasm of unspecified behavior of other specified sites: Secondary | ICD-10-CM | POA: Diagnosis not present

## 2020-05-02 DIAGNOSIS — C37 Malignant neoplasm of thymus: Secondary | ICD-10-CM | POA: Diagnosis not present

## 2020-05-02 MED ORDER — IOHEXOL 300 MG/ML  SOLN
75.0000 mL | Freq: Once | INTRAMUSCULAR | Status: AC | PRN
  Administered 2020-05-02: 75 mL via INTRAVENOUS

## 2020-05-03 ENCOUNTER — Encounter: Payer: Self-pay | Admitting: Radiation Oncology

## 2020-05-03 ENCOUNTER — Ambulatory Visit
Admission: RE | Admit: 2020-05-03 | Discharge: 2020-05-03 | Disposition: A | Discharge status: Home / Self Care | Payer: PPO | Source: Ambulatory Visit | Attending: Radiation Oncology | Admitting: Radiation Oncology

## 2020-05-03 VITALS — BP 147/88 | HR 86 | Temp 97.4°F | Resp 18 | Wt 191.1 lb

## 2020-05-03 DIAGNOSIS — Z923 Personal history of irradiation: Secondary | ICD-10-CM | POA: Insufficient documentation

## 2020-05-03 DIAGNOSIS — R4189 Other symptoms and signs involving cognitive functions and awareness: Secondary | ICD-10-CM | POA: Insufficient documentation

## 2020-05-03 DIAGNOSIS — C37 Malignant neoplasm of thymus: Secondary | ICD-10-CM | POA: Insufficient documentation

## 2020-05-03 DIAGNOSIS — J90 Pleural effusion, not elsewhere classified: Secondary | ICD-10-CM | POA: Diagnosis not present

## 2020-05-03 DIAGNOSIS — Z08 Encounter for follow-up examination after completed treatment for malignant neoplasm: Secondary | ICD-10-CM | POA: Diagnosis not present

## 2020-05-03 NOTE — Progress Notes (Signed)
Radiation Oncology Follow up Note  Name: Madison Burns   Date:   05/03/2020 MRN:  [id number] DOB: 09-Jan-1951    This 70 y.o. female presents to the clinic today for 47-month follow-up status post external beam radiation therapy to her mediastinum for a thymoma.Masokastage IVa  REFERRING PROVIDER: Einar Pheasant, MD  HPI: Patient is a 70 year old female with cognitive impairment who is cared for by her sister.  She underwent external beam radiation therapy for a thymoma.Masokastage IVa over 10 months prior she is seen today in routine follow-up she is doing well most of her myasthenia symptoms have been subsided.  She recently was hospitalized for MG flare as well as pneumonia.  This has cleared.  Patient is also had cryoablation for a renal cell tumor.  Follow-up imaging of that seems to show good response.  She had a CT scan yesterday of her chest showing interval decrease in the size of partially calcified anterior mediastinal mass compatible with response to therapy.  She did have some new pericardial thickening and a small left pleural effusion which may be related to her prior pneumonia.  She specifically denies dysphagia cough or chest pain.  COMPLICATIONS OF TREATMENT: none  FOLLOW UP COMPLIANCE: keeps appointments   PHYSICAL EXAM:  BP (!) 147/88 (BP Location: Left Arm, Patient Position: Sitting)   Pulse 86   Temp (!) 97.4 F (36.3 C) (Tympanic)   Resp 18   Wt 191 lb 1.6 oz (86.7 kg)   BMI 33.85 kg/m  Well-developed well-nourished patient in NAD. HEENT reveals PERLA, EOMI, discs not visualized.  Oral cavity is clear. No oral mucosal lesions are identified. Neck is clear without evidence of cervical or supraclavicular adenopathy. Lungs are clear to A&P. Cardiac examination is essentially unremarkable with regular rate and rhythm without murmur rub or thrill. Abdomen is benign with no organomegaly or masses noted. Motor sensory and DTR levels are equal and symmetric in the upper  and lower extremities. Cranial nerves II through XII are grossly intact. Proprioception is intact. No peripheral adenopathy or edema is identified. No motor or sensory levels are noted. Crude visual fields are within normal range.  RADIOLOGY RESULTS: CT scan reviewed compatible with above-stated findings  PLAN: Present time patient is doing well with excellent results clinical response by CT criteria and pleased with her overall progress.  I have asked to see her back in 6 months for follow-up.  Patient knows to call with any concerns.  Her sister is her caretaker and knows to call with any problems.  I would like to take this opportunity to thank you for allowing me to participate in the care of your patient.Noreene Filbert, MD

## 2020-05-07 ENCOUNTER — Telehealth: Payer: Self-pay

## 2020-05-07 DIAGNOSIS — D4989 Neoplasm of unspecified behavior of other specified sites: Secondary | ICD-10-CM

## 2020-05-07 NOTE — Telephone Encounter (Signed)
-----   Message from Earlie Server, MD sent at 05/04/2020  9:05 PM EDT ----- Let patient/sister know that the thymoma size has decreased.  CT showed thickening of pericardium. Please forward this results to her PCP and cardiology.  Please adjust her follow up to be 6 months from now, after her next CT in September [ Dr.Chrystal ordered)

## 2020-05-07 NOTE — Telephone Encounter (Signed)
See note.  Will need to be worked in for f/u.

## 2020-05-07 NOTE — Telephone Encounter (Signed)
CT result electronically routed as MD requested.    Patient's sister, [name], informed of results.

## 2020-05-08 NOTE — Telephone Encounter (Signed)
Patient has not been scheduled for CT by Dr. Baruch Gouty.    Please schedule patient for 6 months CT and lab/MD few days later-cx 8/22 appts (CT chest w contrast-restaging thymoma).

## 2020-05-08 NOTE — Telephone Encounter (Signed)
Done.. Pt sister [name] was made aware of all appts sched.

## 2020-05-17 DIAGNOSIS — D225 Melanocytic nevi of trunk: Secondary | ICD-10-CM | POA: Diagnosis not present

## 2020-05-17 DIAGNOSIS — D235 Other benign neoplasm of skin of trunk: Secondary | ICD-10-CM | POA: Diagnosis not present

## 2020-05-21 DIAGNOSIS — I4891 Unspecified atrial fibrillation: Secondary | ICD-10-CM | POA: Diagnosis not present

## 2020-05-22 ENCOUNTER — Ambulatory Visit: Payer: PPO | Admitting: Radiation Oncology

## 2020-06-08 ENCOUNTER — Other Ambulatory Visit: Payer: Self-pay | Admitting: Internal Medicine

## 2020-06-08 NOTE — Telephone Encounter (Signed)
rx sent in for tessalon perles - #[phone] with no refills.

## 2020-06-14 DIAGNOSIS — K219 Gastro-esophageal reflux disease without esophagitis: Secondary | ICD-10-CM | POA: Diagnosis not present

## 2020-06-14 DIAGNOSIS — K580 Irritable bowel syndrome with diarrhea: Secondary | ICD-10-CM | POA: Diagnosis not present

## 2020-06-14 DIAGNOSIS — R1314 Dysphagia, pharyngoesophageal phase: Secondary | ICD-10-CM | POA: Diagnosis not present

## 2020-06-15 ENCOUNTER — Other Ambulatory Visit: Payer: Self-pay | Admitting: Gastroenterology

## 2020-06-15 DIAGNOSIS — R1314 Dysphagia, pharyngoesophageal phase: Secondary | ICD-10-CM

## 2020-06-20 ENCOUNTER — Ambulatory Visit: Admission: RE | Admit: 2020-06-20 | Payer: PPO | Source: Ambulatory Visit

## 2020-06-25 ENCOUNTER — Other Ambulatory Visit: Payer: PPO

## 2020-06-28 DIAGNOSIS — F79 Unspecified intellectual disabilities: Secondary | ICD-10-CM | POA: Diagnosis not present

## 2020-06-28 DIAGNOSIS — F39 Unspecified mood [affective] disorder: Secondary | ICD-10-CM | POA: Diagnosis not present

## 2020-06-28 DIAGNOSIS — G4700 Insomnia, unspecified: Secondary | ICD-10-CM | POA: Diagnosis not present

## 2020-06-29 ENCOUNTER — Other Ambulatory Visit: Payer: Self-pay

## 2020-06-29 ENCOUNTER — Ambulatory Visit
Admission: RE | Admit: 2020-06-29 | Discharge: 2020-06-29 | Disposition: A | Discharge status: Home / Self Care | Payer: PPO | Source: Ambulatory Visit | Attending: Gastroenterology | Admitting: Gastroenterology

## 2020-06-29 DIAGNOSIS — R1314 Dysphagia, pharyngoesophageal phase: Secondary | ICD-10-CM

## 2020-06-29 DIAGNOSIS — K219 Gastro-esophageal reflux disease without esophagitis: Secondary | ICD-10-CM | POA: Diagnosis not present

## 2020-07-19 ENCOUNTER — Other Ambulatory Visit: Payer: Self-pay | Admitting: Internal Medicine

## 2020-07-23 ENCOUNTER — Other Ambulatory Visit: Payer: Self-pay | Admitting: Internal Medicine

## 2020-07-23 ENCOUNTER — Other Ambulatory Visit: Payer: Self-pay | Admitting: Neurology

## 2020-07-23 DIAGNOSIS — G40309 Generalized idiopathic epilepsy and epileptic syndromes, not intractable, without status epilepticus: Secondary | ICD-10-CM

## 2020-07-23 DIAGNOSIS — F73 Profound intellectual disabilities: Secondary | ICD-10-CM

## 2020-07-23 DIAGNOSIS — G4733 Obstructive sleep apnea (adult) (pediatric): Secondary | ICD-10-CM

## 2020-08-10 DIAGNOSIS — G4733 Obstructive sleep apnea (adult) (pediatric): Secondary | ICD-10-CM | POA: Diagnosis not present

## 2020-08-22 ENCOUNTER — Encounter: Payer: Self-pay | Admitting: Neurology

## 2020-08-22 ENCOUNTER — Ambulatory Visit (INDEPENDENT_AMBULATORY_CARE_PROVIDER_SITE_OTHER): Payer: PPO | Admitting: Neurology

## 2020-08-22 ENCOUNTER — Encounter: Payer: Self-pay | Admitting: Oncology

## 2020-08-22 VITALS — BP 108/71 | HR 93 | Ht 63.0 in | Wt 194.0 lb

## 2020-08-22 DIAGNOSIS — Z9989 Dependence on other enabling machines and devices: Secondary | ICD-10-CM | POA: Diagnosis not present

## 2020-08-22 DIAGNOSIS — D696 Thrombocytopenia, unspecified: Secondary | ICD-10-CM

## 2020-08-22 DIAGNOSIS — G4730 Sleep apnea, unspecified: Secondary | ICD-10-CM

## 2020-08-22 DIAGNOSIS — C642 Malignant neoplasm of left kidney, except renal pelvis: Secondary | ICD-10-CM

## 2020-08-22 DIAGNOSIS — J9601 Acute respiratory failure with hypoxia: Secondary | ICD-10-CM | POA: Diagnosis not present

## 2020-08-22 DIAGNOSIS — G40909 Epilepsy, unspecified, not intractable, without status epilepticus: Secondary | ICD-10-CM | POA: Diagnosis not present

## 2020-08-22 DIAGNOSIS — G4733 Obstructive sleep apnea (adult) (pediatric): Secondary | ICD-10-CM | POA: Diagnosis not present

## 2020-08-22 DIAGNOSIS — G7000 Myasthenia gravis without (acute) exacerbation: Secondary | ICD-10-CM | POA: Diagnosis not present

## 2020-08-22 DIAGNOSIS — R0601 Orthopnea: Secondary | ICD-10-CM | POA: Diagnosis not present

## 2020-08-22 DIAGNOSIS — F39 Unspecified mood [affective] disorder: Secondary | ICD-10-CM | POA: Diagnosis not present

## 2020-08-22 DIAGNOSIS — G4710 Hypersomnia, unspecified: Secondary | ICD-10-CM

## 2020-08-22 MED ORDER — METHOTREXATE SODIUM 2.5 MG PO TABS: ORAL_TABLET | ORAL | 5 refills | Status: DC

## 2020-08-22 NOTE — Progress Notes (Signed)
PATIENT: Madison Burns DOB: [birth year]  REASON FOR VISIT: follow up HISTORY FROM: patient  Chief Complaint  Patient presents with   Follow-up    Pt with sister, rm 54. Pt states here for follow up visit. She was hospitalized in feb and since dc from hospital has been doing fairly well. DME.[employer] ([place]) set up date 03/2015, due for a new machine. Current machine no longer connects to tower and last data was recorded April 2022. She does use her machine nightly. Has been snoring with the machine on      HISTORY OF PRESENT ILLNESS: Today 08/22/20 Madison Burns is a 70 y.o. female with mental retardation, living with her sister. Madison Burns has now been diagnosed with renal cell cancer and her Myasthenia has taken a turn for the worse. She is presenting with ptosis , neck weakness and left shoulder slump. She is no longer able to walk unassisted, presents stooped. Her voice is hoarse. She is able to raise her voice and I can understand her quite clearly in spite of the mask.  She was hospitalized in February of this year and discharged from the hospital fairly good.  [employer] [facility] I had such her up on 03/2015 so she would be due for new machine this year.  The current machine no longer connects and last data were recorded in April and then stopped so I need to rather soon replaces since she has myasthenia if not we will make sense for her to have a repeat Burns study I want to make sure that she does not need a BiPAP instead of her CPAP at this time.  She has reportedly been snoring even when using her machine there is a baby monitor in her room and her sister can hear her snoring this way.  The patient's Epworth sleepiness score is endorsed at 14 points and the fatigue severity score at 58 points of both have increased which can also be a side effect of her malignancy.  Her current compliance from April is excellent 100% of days the machine was used and 79% over 4 hours with a residual  AHI of 1.8 she only used 5 cm water pressure with 3 cm EPR due to a mostly upper airway resistance syndrome at the time that she was set up.  She is sleeping in a hospital bed with elevated head.         She was last seen by Madison Burns , NP on 09-06-2019.  She also has developed wheezing in the meantime but she last saw Madison she presented much less impaired.  In the meantime which she had called our office.  She has completed her chemoradiation therapy and she is certainly has a weaker immune system she also developed pneumonia and sepsis following her cancer treatment and surgery.  She was seen at the [facility] emergency room just 10 days ago on 9-17 21 with breathing difficulties shortness of breath thought to be due to left-sided pneumonia and possible myasthenia gravis exacerbation.  She underwent treatment with antibiotics and IVIG and ultimately was discharged after week.  She gets short of breath now with exertion still, sometimes she is wheezing. She is sleeping in a hospital bed, and she cannot breath  easily- has orthopnea. This bed is adjustable and helps her shortness of breath.   Since the patient was discharged from the hospital she has resumed compliant use of her CPAP and would now be 100% for the  time since discharge.  Average use at time 9 hours 2 minutes, she is above average sleepy the CPAP is set to 5 cmH2O with 3 cm EPR and her AHI is 2.2 which is an excellent resolution of this otherwise mild apnea which was more classified as a high apnea.  I am worried that she is so weak at this time that she may not participate well in physical therapy or gait stabilization but I will wait if she has regained some of her strength and respiratory capacity in the next 3 months so that we could do some physical therapy as well.      1-20-2021CD Madison Burns has been on medication for myasthenia gravis, she had lost some weight since eating mostly at home during the pandemic.  She  still has a good appetite no trouble swallowing she had no falls and no changes in gait.  No taking any seizure medicine, she has continued Mestinon and methotrexate for myasthenia gravis and it was felt that she was fairly stable.   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 This would be her third tumor.   IMPRESSION: 1. Apparent subtle 3.2 cm homogeneously hypoenhancing lesion in the posterior interpolar left kidney. Imaging features raise concern for papillary type renal cell carcinoma. Abdominal MRI with and without contrast recommended to further evaluate. 2. Bilateral adrenal nodules. These could also be further assessed at the time of follow-up MRI. 3. No specific findings to explain the patient's history of periumbilical pain. 4. Tiny gas bubble identified in the urinary bladder, presumably from recent instrumentation. In the absence of instrumentation, infection would be a consideration. 5. Insert [name] 6. Left colonic diverticulosis without diverticulitis.  She has been followed here by me for OSA- for obstructive Burns apnea and has been a very compliant CPAP user V visit for Burns apnea is not to do until September 2021 with Madison Burns.    Madison Burns, 11-03-2017 here today for follow up of MG, seizure, and OSA on CPAP. She is here today with her sister , Madison Burns who aids in history. She continues mestinon and methotrexate for MG and feels symptoms are fairly stable. No vision changes, no double vision. No new or worsening weakness. She has lost some weight as they are eating at home more since pandemic. She has a good appetite. No trouble swallowing. No changes in gait or falls. She is  able to perform ADL's. She lives with her sister. She has not taken seizure medication in many years with no seizure activity noted. No adverse effects or worsening symptoms with decreased dose of methotrexate.   Compliance report dated 10/03/2018 through 11/01/2018 reveals that she is using CPAP nightly for compliance 100%.  She is using CPAP greater than 4 hours every night.  Average usage is 9 hours and 57 minutes.  AHI is 1.9 on 5 cm of water.  There is no significant leak.  HISTORY: (copied from [employer] note on 01/05/2018)  HISTORY CDRebecca H Burns is a 70 y.o. female seen here as a referral from Dr. Nicki Reaper for Myasthenia gravis, per special request of the patient's sister .  Mrs. Bizzarro carries a diagnosis of myasthenia gravis associated with difficulties swallowing, with a decreased level of energy a high degree of fatigue sometimes best interest in activities facial droop eyelid droop sleepiness snoring easy bruising, coughing, trouble swallowing she has a mild hoarseness but not as significant dysphonia and incontinence.  Her family and the patient looking for a maintenance neurologist to keep her myasthenia under control. The patient has been diagnosed and treated by Dr Carlis Abbott in [place]- until his death.She has been followed by Dr. Melrose Nakayama at the [facility] clinic after following Dr. Paulla Dolly for 18 month .  The patient was diagnosed in 2011 by dr Carlis Abbott-  with myasthenia gravis. She was hospitalized with myasthnic crisis Jan 27 2010- through Januery 14th 2012. She was send to rehabilitation after that, by 03-2010. [name]  Her sister noticed a decline in cognitive skills at that time.  She has noted some core weakness- MG , no ptosis, no double vision.    She  is sleeping 12 hours easily per day, from 10 Pm to 8 AM. She has a droopy right eye.  She missed frequently the 3rd. Mestinon.  She has been followed by Dr. Melrose Nakayama at the [facility] clinic after following Dr. Paulla Dolly .  Again,   her first treatment had been prednisone and this was just weaned off by September 2015. The patient carries a diagnosis of hypercholesterolemia. She also has a past surgical history of appendectomy in 1999, hysterectomy in 2004 and the melanoma was removed in 2014 from the right side of the neck close to the TM joint.   Ms. Soulier is single without children,  Lives with her sister now used to live with her mother. She is MRDD, she left school in 7th grade, went to a special facility until her GED, [employer]. She used to work at a [employer] ,later in a vocational trade.    Ms. Riches is taking methotrexate to control them myasthenia she is also on Mestinon 60 mg 3 times a day she takes Detrol for urinary continence at 4 mg in the morning and she takes Risperdal 2 mg I mouth at bedtime. She is also treated for hypothyroidism with levothyroxine at 50 g daily. The methotrexate is given at 8 tablets once a week. Until last September 2015 the patient had been on prednisone. She has not had labs drawn for the last 5 month, while continuing on MTX.   4/19/17Today's visit is solely dedicated to the Burns disorder part of her neurological care   Mrs. Willis underwent a Burns apnea test as a home Burns test, the AHI was 20.7 the lowest desaturation was 61 with 309 minutes of desaturation. This of course a possibility that in a home Burns test the hypoxemia is overestimated or artifact related but she does have a significant amount of obstructive Burns apnea as well. For this reason and because of her underlying condition of myasthenia gravis the patient was asked to undergo an attended CPAP titration. This took place on 04-19-14 the AHI was 0.3 on CPAP of only 5 cm water. Her SPO2 nadir rose to 91% no added oxygen was needed. I prescribed a ResMed air-fluid P 10 in medium size and an auto CPAP from 4 through 8 cm water. The patient states that she has not received CPAP machine that she had an outstanding bill was advanced  home care which may be the reason.  She is on Medicare/ Medicaid  and a Burns study cannot be older  than 6 months to allow her to obtain a machine - so I will have to repeat her Burns study. Her Epworth sleepiness score remains high at 17 points, her depression score at 3.5, fatigue severity questionnaire at 38 points. MRDD - needs assistence with CPAP, certainly would need a desensitization session with [facility] in [place].      UPDATE 10/19/2017CM Ms. Stebbins, 70 year old female returns for follow-up with her mother. She has a history of obstructive Burns apnea on CPAP compliance report today is 100% for 30 days average usage 10 hours 6 minutes AHI is 1.1. No leaks. In addition she has myasthenia gravis and is currently on Mestinon and methotrexate. She needs refills. No recent falls. She has no facial weakness no trouble swallowing. She also has a history of seizure disorder but has not had seizures in many years and has been weaned off of her seizure medication. She returns for reevaluation   02/23/17 CD I have the pleasure of following Ms. Madison Burns for her obstructive Burns apnea, and she has again proven to be a highly compliant CPAP user.  Her CPAP compliance is 93% with 9 hours and 9 minutes on average daily use.  CPAP is set at only 5 cm water pressure and allows for reduction of the AHI to 0.6.  No central apneas are emerging and she has very few air leaks. Her baseline AHI in 02-2015 was 20.8/hr.  "She reports dreaming more- good dreams". She sleeps well. She has trouble with swallowing solid foods.  In addition she has not had significant muscle weakness, no falls, but she does feel easily fatigued and sleepy.  She endorsed the Epworth score today on 11 and the fatigue severity at 54 points. 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 She often cried during this task.   UPDATE 11/19/2019CM Ms. Madison Burns, 70 year old female returns for follow-up with history of  obstructive Burns apnea on CPAP.  She also has a history of myasthenia gravis and seizure disorder.  She is no longer on seizure medication and no seizures in many years.  CPAP compliance dated 12/05/2017-01/03/2018 shows compliance greater than 4 hours at 100%.  Set pressure 5 cm.  Average usage 10 hours 29 minutes.  No leak.  AHI 1.3.  She remains on Mestinon and methotrexate for her myasthenia gravis which has been stable.  She complains of intermittent ptosis of the right eye when fatigued.  This is not evident today.  She has no double vision.  Appetite is good and she sleeps well she returns for reevaluation   REVIEW OF SYSTEMS: Out of a complete 14 system review of symptoms, the patient complains only of the following symptoms, none and all other reviewed systems are negative.   SOB, stooped, head drop on the chest, ptosis. Fatigue.  thymoma, malignant (?).  Mediastinal LN swelling and renal pole cancer , l has had chemotherapy and radiation, sepsis and pneumonia. left kidney  Still present the tumor was " frozen". .      How likely are you to doze in the following situations: 0 = not likely, 1 = slight chance, 2 = moderate chance, 3 = high chance  Sitting and Reading? Watching Television? Sitting inactive in a public place (theater or meeting)? Lying down in the afternoon when circumstances permit? Sitting and talking to someone? As a passenger in a car for an hour without a break?  Total = 14/18, higher since she had been in  cancer treatment.   Myasthenia, orthopenia, had a ED visit. Has contracted Pneumonia, 03-21-2020. More choking, dysphagia, dysphonia. Started to snore again-     ALLERGIES: No Known Allergies  HOME MEDICATIONS: Outpatient Medications Prior to Visit  Medication Sig Dispense Refill   acetaminophen (TYLENOL) 325 MG tablet Take 2 tablets (650 mg total) by mouth every 6 (six) hours as needed for mild pain (or Fever >/= 101).     amiodarone (PACERONE) 200 MG  tablet Take 1 tablet (200 mg total) by mouth daily. 14 tablet 0   Ascorbic Acid (VITAMIN C) 1000 MG tablet Take 1,000 mg by mouth at bedtime.     B Complex-C (B-COMPLEX WITH VITAMIN C) tablet Take 1 tablet by mouth every evening.     benzonatate (TESSALON) 100 MG capsule TAKE 1 CAPSULE BY MOUTH TWICE DAILY AS NEEDED COUGH 90 capsule 0   Biotin 10000 MCG TABS Take 10,000 mcg by mouth daily at 2 PM. (1430)     Calcium-Phosphorus-Vitamin D (CITRACAL +D3 PO) Take 1 tablet by mouth daily.     diltiazem (TIAZAC) 180 MG 24 hr capsule Take by mouth.     DULoxetine (CYMBALTA) 30 MG capsule TAKE 1 CAPSULE BY MOUTH ONCE DAILY (Patient taking differently: Take 30 mg by mouth daily.) 30 capsule 5   folic acid (FOLVITE) 1 MG tablet 2 mg every day po 60 tablet 5   Garlic 7035 MG CAPS Take 1,000 mg by mouth in the morning, at noon, and at bedtime. (0800, 1430, 2000)     levothyroxine (SYNTHROID) 75 MCG tablet TAKE 1 TABLET BY MOUTH ONCE DAILY ON AN EMPTY STOMACH. WAIT 30 MINUTES BEFORE TAKING OTHER MEDS. 90 tablet 1   methotrexate 2.5 MG tablet TAKE 4 TABLETS BY MOUTH BY MOUTH ONCE A WEEK WITH FOLIC ACID (Patient taking differently: Take 10 mg by mouth once a week. TAKE 4 TABLETS BY MOUTH BY MOUTH ONCE A WEEK WITH FOLIC ACID) 16 tablet 5   pantoprazole (PROTONIX) 40 MG tablet Take 40 mg by mouth daily.      Probiotic Product (PROBIOTIC MULTI-ENZYME PO) Take 1 capsule by mouth in the morning and at bedtime. Probiotic Multi-Enzyme Digestive Formula     pyridostigmine (MESTINON) 60 MG tablet TAKE 1 TABLET BY MOUTH 3 TIMES DAILY 90 tablet 2   risperiDONE (RISPERDAL) 1 MG tablet Take 1 mg by mouth at bedtime.      tolterodine (DETROL LA) 4 MG 24 hr capsule TAKE 1 CAPSULE BY MOUTH ONCE EVERY MORNING 90 capsule 1   vitamin E 180 MG (400 UNITS) capsule Take 400 Units by mouth daily at 2 PM. 1430     No facility-administered medications prior to visit.    PAST MEDICAL HISTORY: Past Medical History:  Diagnosis Date    Cancer of kidney ([place]) 0093   Complication of anesthesia    Myasthenia gravis   GERD (gastroesophageal reflux disease)    History of seizure disorder    Hypercholesterolemia    Hypertension    no longer has it. wt loss   Hypothyroidism    Irritable bowel syndrome    Melanoma ([place]) 2017   Melanoma on neck   Myasthenia gravis ([place]) 2011   OSA on CPAP    Thymoma, malignant ([place])     PAST SURGICAL HISTORY: Past Surgical History:  Procedure Laterality Date   ABDOMINAL HYSTERECTOMY  1990   APPENDECTOMY  1999   CHOLECYSTECTOMY  2013   COLONOSCOPY WITH PROPOFOL N/A 06/30/2017   Procedure: COLONOSCOPY WITH  PROPOFOL;  Surgeon: Toledo, Benay Pike, MD;  Location: [facility] END[facility]COPY;  Service: Gastroenterology;  Laterality: N/A;   CYSTOSCOPY W/ URETERAL STENT PLACEMENT Left 08/17/2019   Procedure: CYSTOSCOPY WITH RETROGRADE PYELOGRAM/URETERAL STENT PLACEMENT;  Surgeon: Alexis Frock, MD;  Location: [facility];  Service: Urology;  Laterality: Left;  30 MINS   ESOPHAGOGASTRODUODENOSCOPY (EGD) WITH PROPOFOL N/A 06/30/2017   Procedure: ESOPHAGOGASTRODUODENOSCOPY (EGD) WITH PROPOFOL;  Surgeon: Toledo, Benay Pike, MD;  Location: [facility] ENDOSCOPY;  Service: Gastroenterology;  Laterality: N/A;   IR RADIOLOGIST EVAL & MGMT  06/28/2019   IR RADIOLOGIST EVAL & MGMT  09/14/2019   IR RADIOLOGIST EVAL & MGMT  03/28/2020   RADIOFREQUENCY ABLATION Left 08/17/2019   Procedure: LEFT RENAL CRYO ABLATION;  Surgeon: Jacqulynn Cadet, MD;  Location: [facility];  Service: Anesthesiology;  Laterality: Left;   TONSILLECTOMY  1959    FAMILY HISTORY: Family History  Problem Relation Age of Onset   Colon cancer Maternal Grandfather        stomach/colon   Heart disease Father        myocardial infarction   Hyperlipidemia Father    Hyperlipidemia Sister    Diabetes Sister    Bone cancer Other        cousin   Alzheimer's disease Mother        maternal aunts   Skin cancer Mother    Myasthenia gravis Brother        ocular   Skin  cancer Brother    Stroke Paternal Grandfather    Breast cancer Neg Hx     SOCIAL HISTORY: Social History   Socioeconomic History   Marital status: Single    Spouse name: Not on file   Number of children: 0   Years of education: Special Ed   Highest education level: Not on file  Occupational History   Occupation: Unemployed  Tobacco Use   Smoking status: Never   Smokeless tobacco: Never  Vaping Use   Vaping Use: Never used  Substance and Sexual Activity   Alcohol use: No    Alcohol/week: 0.0 standard drinks   Drug use: No   Sexual activity: Not Currently  Other Topics Concern   Not on file  Social History Narrative   06/23/17 lives with sister, Madison Burns   Regular exercise-no   Caffeine Use-yes   Social Determinants of Health   Financial Resource Strain: Not on file  Food Insecurity: Not on file  Transportation Needs: Not on file  Physical Activity: Not on file  Stress: Not on file  Social Connections: Not on file  Intimate Partner Violence: Not on file      PHYSICAL EXAM  Vitals:   08/22/20 1438  BP: 108/71  Pulse: 93  Weight: 194 lb (88 kg)   Body mass index is 34.37 kg/m. Lungs wheezing to auscultation, patient cannot whistle not hold her breath longer than 12 seconds-   Generalized: Well developed, in no acute distress . She is fatigued, she is stooped, SOB.   Neurological examination - alert  Mentation: [place] is developmentally delayed, unable to live alone, she is not fully oriented to place and date.  Cranial nerve : No change in taste or smell reported.  Pupils were equal round reactive to light.  Extraocular movements were full, ptosis bilaterally. Head droopy, left leaning.  Facial sensation and strength were normal. Uvula in midline.  Head turning and shoulder shrug  were weak.  Motor: The motor testing reveals reduced strength of all 4 extremities.  Symmetric  motor tone is noted throughout.  Sensory:  intact to soft touch on all 4  extremities. No evidence of extinction is noted.  Coordination: good finger-nose-fingerbilaterally.  Gait and station: Gait is stooped, she is SOB, her feet are shuffling, turning with 5 steps, unsteady. Leaning to the left.   Reflexes: Deep tendon reflexes are symmetric bilaterally.   DIAGNOSTIC DATA (LABS, IMAGING, TESTING) - I reviewed patient records, labs, notes, testing and imaging myself where available.  ED records, chemotherapy and radiation reports.      Lab Results  Component Value Date   WBC 9.6 04/10/2020   HGB 10.5 (L) 04/10/2020   HCT 32.7 (L) 04/10/2020   MCV 95.6 04/10/2020   PLT 252 04/10/2020      Component Value Date/Time   NA 135 04/10/2020 0422   NA 144 04/04/2015 1600   NA 137 02/21/2012 1828   K 4.4 04/10/2020 0422   K 4.3 02/21/2012 1828   CL 103 04/10/2020 0422   CL 104 02/21/2012 1828   CO2 27 04/10/2020 0422   CO2 25 02/21/2012 1828   GLUCOSE 106 (H) 04/10/2020 0422   GLUCOSE 145 (H) 02/21/2012 1828   BUN 21 04/10/2020 0422   BUN 8 04/04/2015 1600   BUN 8 02/21/2012 1828   CREATININE 0.78 04/10/2020 0422   CREATININE 1.14 02/21/2012 1828   CALCIUM 8.2 (L) 04/10/2020 0422   CALCIUM 9.0 02/21/2012 1828   PROT 6.1 (L) 04/07/2020 0535   PROT 6.0 04/04/2015 1600   PROT 6.4 04/25/2011 0854   ALBUMIN 2.7 (L) 04/07/2020 0535   ALBUMIN 4.0 04/04/2015 1600   ALBUMIN 3.4 04/25/2011 0854   AST 20 04/07/2020 0535   AST 18 04/25/2011 0854   ALT 24 04/07/2020 0535   ALT 25 04/25/2011 0854   ALKPHOS 58 04/07/2020 0535   ALKPHOS 45 (L) 04/25/2011 0854   BILITOT 0.9 04/07/2020 0535   BILITOT 0.4 04/04/2015 1600   BILITOT 0.4 04/25/2011 0854   GFRNONAA >60 04/10/2020 0422   GFRNONAA 52 (L) 02/21/2012 1828   GFRAA >60 11/04/2019 1725   GFRAA >60 02/21/2012 1828   Lab Results  Component Value Date   CHOL 197 12/14/2018   HDL 36.80 (L) 12/14/2018   LDLCALC 110 (H) 08/06/2018   LDLDIRECT 122.0 12/14/2018   TRIG 210.0 (H) 12/14/2018   CHOLHDL 5  12/14/2018   Lab Results  Component Value Date   HGBA1C 5.4 12/14/2018   No results found for: [id number] Lab Results  Component Value Date   TSH 4.334 04/07/2020       ASSESSMENT : 35 minutes.   Epworth 14/18, higher since she had been in cancer treatment.   Myasthenia, orthopenia, had a ED visit. Has contracted Pneumonia, 03-21-2020. More choking, dysphagia, dysphonia. Started to snore again-   70 y.o. year old MRDD patient with history of MG, malignant thymoma, abnormal chest X ray and sepsis, pneumonia and kidney cancer.   MG exacerbation after cancer therapy-  Amarii Madison had been doing very well on Mestinon and resume methotrexate. The therapy was reduced to improve her immune response under the cancer treatments which lasted into May 2021.   Up to July 2021 her physical appearance was unchanged, strong.     We will continue  Mestinon 60 mg 3 times daily as prescribed.  She needs to restart on MTX or prednisone to control AChAb output. Respiratory system can probably improve quicker with some prednisone at this time.  She will need a CMET, CBC diff and  folic acid level next visit.    OSA : She was using CPAP again compliantly- doing well in terms of OSA- Needs urgent new overnight study in bed 2, SPLIT - does she need PAP alone or BiPAP?    I spent 21 minutes with the patient. I will ask her to follow up in 3 month with NP.  [name], MD    08/22/2020, 3:10 PM [facility] [address] [place], [place] [address] [phone]

## 2020-08-29 ENCOUNTER — Encounter: Payer: Self-pay | Admitting: Oncology

## 2020-09-03 ENCOUNTER — Other Ambulatory Visit: Payer: Self-pay | Admitting: Interventional Radiology

## 2020-09-03 DIAGNOSIS — N2889 Other specified disorders of kidney and ureter: Secondary | ICD-10-CM

## 2020-09-04 ENCOUNTER — Other Ambulatory Visit: Payer: Self-pay | Admitting: Internal Medicine

## 2020-09-10 ENCOUNTER — Other Ambulatory Visit: Payer: Self-pay | Admitting: Internal Medicine

## 2020-09-13 ENCOUNTER — Encounter: Payer: Self-pay | Admitting: Oncology

## 2020-09-20 ENCOUNTER — Other Ambulatory Visit: Payer: Self-pay | Admitting: Oncology

## 2020-09-27 ENCOUNTER — Ambulatory Visit: Payer: PPO | Admitting: Oncology

## 2020-09-27 ENCOUNTER — Other Ambulatory Visit: Payer: PPO

## 2020-09-28 ENCOUNTER — Encounter: Payer: Self-pay | Admitting: Oncology

## 2020-10-01 ENCOUNTER — Encounter: Payer: Self-pay | Admitting: Oncology

## 2020-10-05 ENCOUNTER — Ambulatory Visit
Admission: RE | Admit: 2020-10-05 | Discharge: 2020-10-05 | Disposition: A | Discharge status: Home / Self Care | Payer: Medicare Other | Source: Ambulatory Visit | Attending: Interventional Radiology | Admitting: Interventional Radiology

## 2020-10-05 ENCOUNTER — Other Ambulatory Visit: Payer: Self-pay

## 2020-10-05 DIAGNOSIS — N2889 Other specified disorders of kidney and ureter: Secondary | ICD-10-CM | POA: Diagnosis not present

## 2020-10-05 MED ORDER — GADOBUTROL 1 MMOL/ML IV SOLN
9.0000 mL | Freq: Once | INTRAVENOUS | Status: AC | PRN
  Administered 2020-10-05: 10 mL via INTRAVENOUS

## 2020-10-08 ENCOUNTER — Encounter: Payer: Self-pay | Admitting: Oncology

## 2020-10-10 ENCOUNTER — Encounter: Payer: Self-pay | Admitting: *Deleted

## 2020-10-10 ENCOUNTER — Other Ambulatory Visit: Payer: Self-pay

## 2020-10-10 ENCOUNTER — Ambulatory Visit
Admission: RE | Admit: 2020-10-10 | Discharge: 2020-10-10 | Disposition: A | Discharge status: Home / Self Care | Payer: Medicare Other | Source: Ambulatory Visit | Attending: Interventional Radiology | Admitting: Interventional Radiology

## 2020-10-10 DIAGNOSIS — N2889 Other specified disorders of kidney and ureter: Secondary | ICD-10-CM

## 2020-10-10 HISTORY — PX: IR RADIOLOGIST EVAL & MGMT: IMG5224

## 2020-10-10 NOTE — Progress Notes (Signed)
Chief Complaint: Patient was consulted remotely today (TeleHealth) for left renal cell carcinoma at the request of Madison Keehan K.    Referring Physician(s): Hollice Espy, MD  History of Present Illness: Madison Burns is a 70 y.o. female with multiple medical problems including developmental delay, myasthenia gravis, malignant invasive thymoma  and left renal cell carcinoma.  I spoke predominantly with her sister who serves as her primary caregiver via telephone conference.   Her left lower pole renal lesion was first incidentally noted on a CT scan of the abdomen and pelvis from 02/07/2019.  Subsequent work-up with MRI performed to evaluate her right upper quadrant pain and left-sided renal mass was performed on 02/21/2019.  The MRI identified and endophytic 3.2 x 2.6 cm avidly enhancing lesion in the posterior lower pole of the left kidney.  Unfortunately, evaluation was somewhat limited by respiratory motion artifact.  Additionally, a lesion was identified in the mediastinum which was subsequently further evaluated by a dedicated chest CT.   She then underwent biopsy of both the left lower pole renal lesion and the mediastinal mass in February 2021.   She underwent a CT biopsy on 03/28/2019.  Biopsy consistent with renal cell carcinoma.   Pathology for the mediastinal mass came back as positive for invasive thymoma without evidence of malignancy.  The pathology for the left lower pole renal lesion came back as positive for renal cell carcinoma.   She first underwent treatment for her thymoma in which she received radiation treatment on 06/08/19 and underwent 2 cycles of reduced carboplatin and Taxol, now complete.  Her sister reports that she tolerated both the chemotherapy and the radiation therapy very well with no complications or side effects.   Surveillance CT Abdomen w wo contrast from 06/15/19 revealed stable appearance of her biopsy-proven left lower pole renal cell carcinoma.   I have reviewed her imaging independently.  The lesion is quite endophytic and extends toward the renal collecting system and the hilum.  By my measurements, the lesion measures up to 4 cm in diameter, slightly larger than previously reported.   She subsequently underwent placement of a left-sided double-J ureteral stent by urology followed by percutaneous renal cryoablation on 08/17/2019.  Her postprocedural course was complicated by hemorrhage into the perinephric space at the end of the procedure.  However, she remained hemodynamically stable and was carefully monitored.  The bleeding was self-limited and she was discharged home the next day in good condition.  We are meeting today in follow-up via teleconference.  As usual, her sister is present with her and providing the majority of her medical history.   MRI 03/23/2020: Postprocedural changes related to left lower pole renal cryoablation. No evidence of viable tumor.  MRI 10/07/20:  Unchanged post ablation appearance of the posterior inferior pole of the left kidney. No evidence of recurrent soft tissue or abnormal contrast enhancement.   Madison Burns and her sister were both pleased to hear the good results of her MRI.  Her sister tells me that Madison Burns has had some issues having difficulty with urination.  She plans on following up with Dr. Erlene Quan for this.  Otherwise, she has no flank pain, hematuria or other kidney related symptoms.   Past Medical History:  Diagnosis Date   Cancer of kidney ([place]) [id number]   Complication of anesthesia    Myasthenia gravis   GERD (gastroesophageal reflux disease)    History of seizure disorder    Hypercholesterolemia    Hypertension  no longer has it. wt loss   Hypothyroidism    Irritable bowel syndrome    Melanoma ([place]) 2017   Melanoma on neck   Myasthenia gravis ([place]) 2011   OSA on CPAP    Thymoma, malignant ([place])     Past Surgical History:  Procedure Laterality Date   [place]  2013   COLONOSCOPY WITH PROPOFOL N/A 06/30/2017   Procedure: COLONOSCOPY WITH PROPOFOL;  Surgeon: Toledo, Benay Pike, MD;  Location: [facility];  Service: Gastroenterology;  Laterality: N/A;   CYSTOSCOPY W/ URETERAL STENT PLACEMENT Left 08/17/2019   Procedure: CYSTOSCOPY WITH RETROGRADE PYELOGRAM/URETERAL STENT PLACEMENT;  Surgeon: Alexis Frock, MD;  Location: [facility];  Service: Urology;  Laterality: Left;  30 MINS   ESOPHAGOGASTRODUODENOSCOPY (EGD) WITH PROPOFOL N/A 06/30/2017   Procedure: ESOPHAGOGASTRODUODENOSCOPY (EGD) WITH PROPOFOL;  Surgeon: Toledo, Benay Pike, MD;  Location: [facility] END[facility]COPY;  Service: Gastroenterology;  Laterality: N/A;   IR RADIOLOGIST EVAL & MGMT  06/28/2019   IR RADIOLOGIST EVAL & MGMT  09/14/2019   IR RADIOLOGIST EVAL & MGMT  03/28/2020   IR RADIOLOGIST EVAL & MGMT  10/10/2020   RADIOFREQUENCY ABLATION Left 08/17/2019   Procedure: LEFT RENAL CRYO ABLATION;  Surgeon: Jacqulynn Cadet, MD;  Location: [facility];  Service: Anesthesiology;  Laterality: Left;   TONSILLECTOMY  1959    Allergies: Patient has no known allergies.  Medications: Prior to Admission medications   Medication Sig Start Date End Date Taking? Authorizing Provider  acetaminophen (TYLENOL) 325 MG tablet Take 2 tablets (650 mg total) by mouth every 6 (six) hours as needed for mild pain (or Fever >/= 101). 04/13/20   Cherene Altes, MD  amiodarone (PACERONE) 200 MG tablet Take 1 tablet (200 mg total) by mouth daily. 04/14/20   Cherene Altes, MD  Ascorbic Acid (VITAMIN C) 1000 MG tablet Take 1,000 mg by mouth at bedtime.    [provider]  B Complex-C (B-COMPLEX WITH VITAMIN C) tablet Take 1 tablet by mouth every evening.    [provider]  benzonatate (TESSALON) 100 MG capsule TAKE 1 CAPSULE BY MOUTH TWICE DAILY AS NEEDED COUGH 09/05/20   Einar Pheasant, MD  Biotin 10000 MCG TABS Take 10,000 mcg by mouth daily at 2 PM. (1430)    [provider]  Calcium-Phosphorus-Vitamin D (CITRACAL +D3 PO) Take 1 tablet by mouth daily.    [provider]  diltiazem (TIAZAC) 180 MG 24 hr capsule Take by mouth. 05/21/20   [provider]  DULoxetine (CYMBALTA) 30 MG capsule TAKE 1 CAPSULE BY MOUTH ONCE DAILY 09/20/20   Earlie Server, MD  folic acid (FOLVITE) 1 MG tablet 2 mg every day po 11/15/19   Dohmeier, Asencion Partridge, MD  Garlic 123XX123 MG CAPS Take 1,000 mg by mouth in the morning, at noon, and at bedtime. (0800, 1430, 2000)    [provider]  levothyroxine (SYNTHROID) 75 MCG tablet TAKE 1 TABLET BY MOUTH ONCE DAILY ON AN EMPTY STOMACH. WAIT 30 MINUTES BEFORE TAKING OTHER MEDS. 06/08/20   Einar Pheasant, MD  methotrexate 2.5 MG tablet TAKE 4 TABLETS BY MOUTH BY MOUTH ONCE A WEEK WITH FOLIC ACID [id number]   Dohmeier, Asencion Partridge, MD  pantoprazole (PROTONIX) 40 MG tablet Take 40 mg by mouth daily.  11/24/17   [provider]  Probiotic Product (PROBIOTIC MULTI-ENZYME PO) Take 1 capsule by mouth in the morning and at bedtime. Probiotic Multi-Enzyme Digestive Formula  [provider]  pyridostigmine (MESTINON) 60 MG tablet TAKE 1 TABLET BY MOUTH 3 TIMES DAILY 07/23/20   Lomax, Amy, NP  risperiDONE (RISPERDAL) 1 MG tablet Take 1 mg by mouth at bedtime.  07/08/18   [provider]  tolterodine (DETROL LA) 4 MG 24 hr capsule TAKE 1 CAPSULE BY MOUTH ONCE EVERY MORNING 07/19/20   Einar Pheasant, MD  vitamin E 180 MG (400 UNITS) capsule Take 400 Units by mouth daily at 2 PM. 1430    [provider]     Family History  Problem Relation Age of Onset   Colon cancer Maternal Grandfather        stomach/colon   Heart disease Father        myocardial infarction   Hyperlipidemia Father    Hyperlipidemia Sister    Diabetes Sister    Bone cancer Other        cousin   Alzheimer's disease Mother        maternal aunts   Skin cancer Mother    Myasthenia gravis Brother        ocular   Skin cancer Brother     Stroke Paternal Grandfather    Breast cancer Neg Hx     Social History   Socioeconomic History   Marital status: Single    Spouse name: Not on file   Number of children: 0   Years of education: Special Ed   Highest education level: Not on file  Occupational History   Occupation: Unemployed  Tobacco Use   Smoking status: Never   Smokeless tobacco: Never  Vaping Use   Vaping Use: Never used  Substance and Sexual Activity   Alcohol use: No    Alcohol/week: 0.0 standard drinks   Drug use: No   Sexual activity: Not Currently  Other Topics Concern   Not on file  Social History Narrative   06/23/17 lives with sister, [name]   Regular exercise-no   Caffeine Use-yes   Social Determinants of Health   Financial Resource Strain: Not on file  Food Insecurity: Not on file  Transportation Needs: Not on file  Physical Activity: Not on file  Stress: Not on file  Social Connections: Not on file    ECOG Status: 0 - Asymptomatic  Review of Systems  Review of Systems: A 12 point ROS discussed and pertinent positives are indicated in the HPI above.  All other systems are negative.  Physical Exam No direct physical exam was performed (except for noted visual exam findings with Video Visits).    Vital Signs: There were no vitals taken for this visit.  Imaging: MR ABDOMEN WWO CONTRAST  Result Date: 10/07/2020 CLINICAL DATA:  Left renal mass surveillance, surveillance post renal cell carcinoma cryoablation EXAM: MRI ABDOMEN WITHOUT AND WITH CONTRAST TECHNIQUE: Multiplanar multisequence MR imaging of the abdomen was performed both before and after the administration of intravenous contrast. CONTRAST:  1m GADAVIST GADOBUTROL 1 MMOL/ML IV SOLN COMPARISON:  03/23/2020 FINDINGS: Lower chest: No acute findings. Hepatobiliary: No mass or other parenchymal abnormality identified. Status post cholecystectomy. Pancreas: No mass, inflammatory changes, or other parenchymal abnormality identified.  Spleen:  Within normal limits in size and appearance. Adrenals/Urinary Tract: Unchanged macroscopic fat containing, definitively benign bilateral adrenal adenomata. Unchanged post ablation appearance of the posterior inferior pole of the left kidney (series 5, image 29, series 21, image 56). No evidence of recurrent soft tissue or abnormal contrast enhancement. Small, benign fluid signal cysts. No evidence of hydronephrosis. Stomach/Bowel: Visualized  portions within the abdomen are unremarkable. Vascular/Lymphatic: No pathologically enlarged lymph nodes identified. No abdominal aortic aneurysm demonstrated. Other:  None. Musculoskeletal: No suspicious bone lesions identified. IMPRESSION: 1. Unchanged post ablation appearance of the posterior inferior pole of the left kidney. No evidence of recurrent soft tissue or abnormal contrast enhancement. 2. No evidence of lymphadenopathy or metastatic disease in the abdomen. Electronically Signed   By: Eddie Candle M.D.   On: 10/07/2020 14:30   IR Radiologist Eval & Mgmt  Result Date: 10/10/2020 Please refer to notes tab for details about interventional procedure. (Op Note)   Labs:  CBC: Recent Labs    04/07/20 0535 04/08/20 0413 04/09/20 0456 04/10/20 0422  WBC 12.7* 11.9* 13.4* 9.6  HGB 10.5* 10.8* 10.2* 10.5*  HCT 31.9* 32.8* 30.6* 32.7*  PLT 212 223 230 252    COAGS: Recent Labs    10/21/19 2112 10/22/19 0758 10/23/19 0222  INR 0.9  --  1.0  APTT  --  32  --     BMP: Recent Labs    10/25/19 0511 10/26/19 0616 10/27/19 0447 11/04/19 1725 03/30/20 0846 04/07/20 0535 04/08/20 0413 04/09/20 0456 04/10/20 0422  NA 133* 135 136 133*   < > 125* 124* 130* 135  K 3.9 3.4* 3.8 3.5   < > 4.1 4.1 4.9 4.4  CL 101 100 100 96*   < > 92* 94* 99 103  CO2 '26 31 30 26   '$ < > '23 22 25 27  '$ GLUCOSE 122* 123* 109* 213*   < > 135* 143* 145* 106*  BUN '20 14 13 21   '$ < > '20 21 20 21  '$ CALCIUM 8.7* 8.4* 8.6* 8.8*   < > 8.5* 8.4* 8.3* 8.2*  CREATININE  0.75 0.61 0.60 0.99   < > 0.78 0.81 0.76 0.78  GFRNONAA >60 >60 >60 59*   < > >60 >60 >60 >60  GFRAA >60 >60 >60 >60  --   --   --   --   --    < > = values in this interval not displayed.    LIVER FUNCTION TESTS: Recent Labs    10/24/19 0543 03/30/20 0846 04/06/20 1124 04/07/20 0535  BILITOT 0.6 0.7 0.8 0.9  AST '30 26 28 20  '$ ALT '17 23 29 24  '$ ALKPHOS 47 69 66 58  PROT 7.0 6.9 6.3* 6.1*  ALBUMIN 2.8* 3.5 3.1* 2.7*    TUMOR MARKERS: No results for input(s): AFPTM, CEA, CA199, CHROMGRNA in the last 8760 hours.  Assessment and Plan:  Very pleasant 70 year old female with biopsy-proven renal cell carcinoma of the lower pole of the left kidney now just over 1 year status post percutaneous cryoablation.  Surveillance imaging demonstrates no evidence of residual or recurrent disease.  No new lesions in either kidney.  We will continue routine surveillance.  1.)  Repeat MRI with gadolinium contrast and accompanying teleclinic visit in 3 months.  After reading Dr. Cherrie Gauze note, it appears that she may be ordering follow-up imaging as well given geography and the fact that Vasilisa lives in the [place] area.  I will ask my office to please make sure we do not duplicate imaging studies unnecessarily.  Electronically Signed: Criselda Peaches 10/10/2020, 4:15 PM   I spent a total of 15 Minutes in remote  clinical consultation, greater than 50% of which was counseling/coordinating care for left renal cell carcinoma.    Visit type: Audio only (telephone). Audio (no video) only due to patient request.  Alternative for in-person consultation at [facility], [address] [place], [place], [place]. This visit type was conducted due to national recommendations for restrictions regarding the COVID-19 Pandemic (e.g. social distancing).  This format is felt to be most appropriate for this patient at this time.  All issues noted in this document were discussed and addressed.

## 2020-10-15 ENCOUNTER — Ambulatory Visit (INDEPENDENT_AMBULATORY_CARE_PROVIDER_SITE_OTHER): Payer: Medicare Other | Admitting: Neurology

## 2020-10-15 DIAGNOSIS — G4733 Obstructive sleep apnea (adult) (pediatric): Secondary | ICD-10-CM

## 2020-10-15 DIAGNOSIS — Z9989 Dependence on other enabling machines and devices: Secondary | ICD-10-CM

## 2020-10-15 DIAGNOSIS — G4710 Hypersomnia, unspecified: Secondary | ICD-10-CM

## 2020-10-15 DIAGNOSIS — R0601 Orthopnea: Secondary | ICD-10-CM

## 2020-10-15 DIAGNOSIS — G40909 Epilepsy, unspecified, not intractable, without status epilepticus: Secondary | ICD-10-CM

## 2020-10-15 DIAGNOSIS — G7000 Myasthenia gravis without (acute) exacerbation: Secondary | ICD-10-CM

## 2020-10-15 DIAGNOSIS — G4730 Sleep apnea, unspecified: Secondary | ICD-10-CM

## 2020-10-15 DIAGNOSIS — C642 Malignant neoplasm of left kidney, except renal pelvis: Secondary | ICD-10-CM

## 2020-10-19 ENCOUNTER — Ambulatory Visit: Payer: PPO | Admitting: Oncology

## 2020-10-19 ENCOUNTER — Other Ambulatory Visit: Payer: PPO

## 2020-10-23 ENCOUNTER — Encounter: Payer: Self-pay | Admitting: Oncology

## 2020-10-23 ENCOUNTER — Other Ambulatory Visit: Payer: Self-pay | Admitting: Internal Medicine

## 2020-10-25 ENCOUNTER — Other Ambulatory Visit: Payer: Self-pay

## 2020-10-25 ENCOUNTER — Ambulatory Visit
Admission: RE | Admit: 2020-10-25 | Discharge: 2020-10-25 | Disposition: A | Discharge status: Home / Self Care | Payer: Medicare Other | Source: Ambulatory Visit | Attending: Oncology | Admitting: Oncology

## 2020-10-25 DIAGNOSIS — D4989 Neoplasm of unspecified behavior of other specified sites: Secondary | ICD-10-CM | POA: Diagnosis not present

## 2020-10-25 DIAGNOSIS — R222 Localized swelling, mass and lump, trunk: Secondary | ICD-10-CM | POA: Insufficient documentation

## 2020-10-25 DIAGNOSIS — Z923 Personal history of irradiation: Secondary | ICD-10-CM | POA: Insufficient documentation

## 2020-10-25 LAB — POCT I-STAT CREATININE: Creatinine, Ser: 1 mg/dL (ref 0.44–1.00)

## 2020-10-25 MED ORDER — IOHEXOL 350 MG/ML SOLN
75.0000 mL | Freq: Once | INTRAVENOUS | Status: AC | PRN
  Administered 2020-10-25: 75 mL via INTRAVENOUS

## 2020-10-29 ENCOUNTER — Inpatient Hospital Stay: Payer: Medicare Other | Attending: Oncology

## 2020-10-29 ENCOUNTER — Encounter: Payer: Self-pay | Admitting: Oncology

## 2020-10-29 ENCOUNTER — Other Ambulatory Visit: Payer: Self-pay

## 2020-10-29 ENCOUNTER — Inpatient Hospital Stay (HOSPITAL_BASED_OUTPATIENT_CLINIC_OR_DEPARTMENT_OTHER): Payer: Medicare Other | Admitting: Oncology

## 2020-10-29 VITALS — BP 133/92 | HR 78 | Temp 97.8°F | Resp 20 | Wt 201.9 lb

## 2020-10-29 DIAGNOSIS — Z85820 Personal history of malignant melanoma of skin: Secondary | ICD-10-CM | POA: Insufficient documentation

## 2020-10-29 DIAGNOSIS — D4989 Neoplasm of unspecified behavior of other specified sites: Secondary | ICD-10-CM

## 2020-10-29 DIAGNOSIS — Z923 Personal history of irradiation: Secondary | ICD-10-CM | POA: Insufficient documentation

## 2020-10-29 DIAGNOSIS — G7000 Myasthenia gravis without (acute) exacerbation: Secondary | ICD-10-CM | POA: Insufficient documentation

## 2020-10-29 DIAGNOSIS — F79 Unspecified intellectual disabilities: Secondary | ICD-10-CM | POA: Insufficient documentation

## 2020-10-29 DIAGNOSIS — C642 Malignant neoplasm of left kidney, except renal pelvis: Secondary | ICD-10-CM

## 2020-10-29 DIAGNOSIS — Z9049 Acquired absence of other specified parts of digestive tract: Secondary | ICD-10-CM | POA: Insufficient documentation

## 2020-10-29 DIAGNOSIS — C37 Malignant neoplasm of thymus: Secondary | ICD-10-CM | POA: Diagnosis not present

## 2020-10-29 DIAGNOSIS — Z79899 Other long term (current) drug therapy: Secondary | ICD-10-CM | POA: Diagnosis not present

## 2020-10-29 LAB — CBC WITH DIFFERENTIAL/PLATELET
Abs Immature Granulocytes: 0.02 10*3/uL (ref 0.00–0.07)
Basophils Absolute: 0 10*3/uL (ref 0.0–0.1)
Basophils Relative: 1 %
Eosinophils Absolute: 0.1 10*3/uL (ref 0.0–0.5)
Eosinophils Relative: 1 %
HCT: 43 % (ref 36.0–46.0)
Hemoglobin: 14.1 g/dL (ref 12.0–15.0)
Immature Granulocytes: 0 %
Lymphocytes Relative: 14 %
Lymphs Abs: 0.8 10*3/uL (ref 0.7–4.0)
MCH: 32 pg (ref 26.0–34.0)
MCHC: 32.8 g/dL (ref 30.0–36.0)
MCV: 97.7 fL (ref 80.0–100.0)
Monocytes Absolute: 0.4 10*3/uL (ref 0.1–1.0)
Monocytes Relative: 7 %
Neutro Abs: 4.8 10*3/uL (ref 1.7–7.7)
Neutrophils Relative %: 77 %
Platelets: 156 10*3/uL (ref 150–400)
RBC: 4.4 MIL/uL (ref 3.87–5.11)
RDW: 16.1 % — ABNORMAL HIGH (ref 11.5–15.5)
WBC: 6.2 10*3/uL (ref 4.0–10.5)
nRBC: 0 % (ref 0.0–0.2)

## 2020-10-29 LAB — COMPREHENSIVE METABOLIC PANEL
ALT: 18 U/L (ref 0–44)
AST: 25 U/L (ref 15–41)
Albumin: 3.8 g/dL (ref 3.5–5.0)
Alkaline Phosphatase: 59 U/L (ref 38–126)
Anion gap: 8 (ref 5–15)
BUN: 14 mg/dL (ref 8–23)
CO2: 31 mmol/L (ref 22–32)
Calcium: 8.6 mg/dL — ABNORMAL LOW (ref 8.9–10.3)
Chloride: 100 mmol/L (ref 98–111)
Creatinine, Ser: 0.96 mg/dL (ref 0.44–1.00)
GFR, Estimated: >60 mL/min (ref >60)
Glucose, Bld: 102 mg/dL — ABNORMAL HIGH (ref 70–99)
Potassium: 3.5 mmol/L (ref 3.5–5.1)
Sodium: 139 mmol/L (ref 135–145)
Total Bilirubin: 0.6 mg/dL (ref 0.3–1.2)
Total Protein: 6.4 g/dL — ABNORMAL LOW (ref 6.5–8.1)

## 2020-10-29 NOTE — Progress Notes (Signed)
Hematology/Oncology follow up  note [facility] Telephone:([phone] [phone]   Patient Care Team: Einar Pheasant, MD as PCP - General (Internal Medicine) Telford Nab, RN as Oncology Nurse Navigator  REFERRING PROVIDER: Einar Pheasant, MD  CHIEF COMPLAINTS/REASON FOR VISIT:  Follow-up for thymoma  HISTORY OF PRESENTING ILLNESS:   Madison Burns is a  70 y.o.  female with PMH listed below was seen in consultation at the request of  Einar Pheasant, MD  for evaluation of thymoma.  Patient was accompanied by her sister today.  History was obtained from her sister.  Patient has intellectual disability Patient initially underwent a CT abdomen pelvis with contrast on 02/07/2019 for worsening umbilical pain.  It reviewed incidental 3 cm renal mass on the left.  Subsequent MRCP 02/21/2019 performed for the purpose of right upper quadrant pain as well as renal mass in question.  MRI 02/20/2018 showed 3.2 x 2.6 left lower pole endophytic renal mass, consistent with possible renal cell carcinoma.  No signs of metastatic disease in abdomen.  Adrenal lesion more suggestive adenomas.  There was an incidental finding of lobular area along the anterior mediastinum, questionably e mediastinal mass.  CT scan 02/07/2019 showed lobulated internally calcified mass of the anterior mediastinum which effaces the central portion of the left brachiocephalic vein and a very closely abuts the tubular ascending aorta, 5.8 x 4.9 x 4.1 cm.  Adjacent pleural nodularity about the right upper lobe. 3 mm pulmonary nodule of the right upper lobe, hepatic steatosis.  #Patient has a chronic history of myasthenia gravis, initially diagnosed in 2011.  Patient had been treated with prednisone for prolonged period of time.  Recently she has been off prednisone and is currently taking methotrexate and pyridostigmine for symptoms.  Per sister, patient does not complain much of weakness.  At her  baseline.  She is able to feed and dress herself.  She needs some assistance from her sister for bathing.  She likes to watch TV.  Patient has been seen by Dr. Genevive Bi and Dr. Erlene Quan.  Patient's sister would like to entertain the diagnosis and potential therapy. Patient underwent CT-guided needle biopsy of the anterior mediastinal mass, as well as biopsy of the kidney mass Anterior mediastinal mass biopsy pathology showed thymoma, predominantly B2 patent with subsets of a B3 pattern. Left kidney mass showed renal cell carcinoma,tumor cannot be reliably subtyped based on this limited sample and the IHC profile  She has a history of melanoma on her neck in 2017.  Sister is POA. Patient lives with sister  #Cancer treatment 04/27/2019-05/18/2019 patient status post 2 cycles of dose reduced carboplatin and etoposide treatment concurrently with radiation. April 2021, patient  finished radiation treatments..  A# 08/17/2019-08/18/2019 Left  RCC, patient was admitted.  Patient underwent cystoscopy with Dr. Tammi Klippel with retrograde ureteral stent placement for renal protection during cryoablation.  The patient was taken to IR for an image guided left renal mass cryoablation by Dr. Laurence Ferrari.  Procedure was complicated by perinephric bleeding following the procedure.  She was monitored closely and remained hemodynamically stable. admitted from 10/22/2019-19 2021 due to A. fib with RVR, secondary to sepsis and respiratory failure with pneumonia.  Patient was also seen by neurology for suspected MG exacerbation secondary to acute infection.  INTERVAL HISTORY Madison Burns is a 70 y.o. female who has above history reviewed by me today presents for follow up visit for management of malignant thymoma, left renal cell carcinoma Problems and complaints are listed below:  Patient was accompanied by sister.  Due to development delay, patient cannot provide much history.  History was obtained from sister.  She is doing well  clinically .  Left RCC s/p ablation 10/05/2020 - stable disease no signs of lymphadenopathy or metastatic disease    Review of Systems  Unable to perform ROS: Other (Intellectual Disability)  Gastrointestinal:  Negative for diarrhea and nausea.  Genitourinary:  Negative for dysuria and frequency.    MEDICAL HISTORY:  Past Medical History:  Diagnosis Date   Cancer of kidney ([place]) 123XX123   Complication of anesthesia    Myasthenia gravis   GERD (gastroesophageal reflux disease)    History of seizure disorder    Hypercholesterolemia    Hypertension    no longer has it. wt loss   Hypothyroidism    Irritable bowel syndrome    Melanoma ([place]) 2017   Melanoma on neck   Myasthenia gravis ([place]) 2011   OSA on CPAP    Thymoma, malignant ([place])     SURGICAL HISTORY: Past Surgical History:  Procedure Laterality Date   ABDOMINAL HYSTERECTOMY  1990   APPENDECTOMY  1999   CHOLECYSTECTOMY  2013   COLONOSCOPY WITH PROPOFOL N/A 06/30/2017   Procedure: COLONOSCOPY WITH PROPOFOL;  Surgeon: Toledo, Benay Pike, MD;  Location: [facility] ENDOSCOPY;  Service: Gastroenterology;  Laterality: N/A;   CYSTOSCOPY W/ URETERAL STENT PLACEMENT Left 08/17/2019   Procedure: CYSTOSCOPY WITH RETROGRADE PYELOGRAM/URETERAL STENT PLACEMENT;  Surgeon: Alexis Frock, MD;  Location: [facility];  Service: Urology;  Laterality: Left;  30 MINS   ESOPHAGOGASTRODUODENOSCOPY (EGD) WITH PROPOFOL N/A 06/30/2017   Procedure: ESOPHAGOGASTRODUODENOSCOPY (EGD) WITH PROPOFOL;  Surgeon: Toledo, Benay Pike, MD;  Location: [facility] ENDOSCOPY;  Service: Gastroenterology;  Laterality: N/A;   IR RADIOLOGIST EVAL & MGMT  06/28/2019   IR RADIOLOGIST EVAL & MGMT  09/14/2019   IR RADIOLOGIST EVAL & MGMT  03/28/2020   IR RADIOLOGIST EVAL & MGMT  10/10/2020   RADIOFREQUENCY ABLATION Left 08/17/2019   Procedure: LEFT RENAL CRYO ABLATION;  Surgeon: Jacqulynn Cadet, MD;  Location: [facility];  Service: Anesthesiology;  Laterality: Left;   TONSILLECTOMY  1959     SOCIAL HISTORY: Social History   Socioeconomic History   Marital status: Single    Spouse name: Not on file   Number of children: 0   Years of education: Special Ed   Highest education level: Not on file  Occupational History   Occupation: Unemployed  Tobacco Use   Smoking status: Never   Smokeless tobacco: Never  Vaping Use   Vaping Use: Never used  Substance and Sexual Activity   Alcohol use: No    Alcohol/week: 0.0 standard drinks   Drug use: No   Sexual activity: Not Currently  Other Topics Concern   Not on file  Social History Narrative   06/23/17 lives with sister, [name]   Regular exercise-no   Caffeine Use-yes   Social Determinants of Health   Financial Resource Strain: Not on file  Food Insecurity: Not on file  Transportation Needs: Not on file  Physical Activity: Not on file  Stress: Not on file  Social Connections: Not on file  Intimate Partner Violence: Not on file    FAMILY HISTORY: Family History  Problem Relation Age of Onset   Colon cancer Maternal Grandfather        stomach/colon   Heart disease Father        myocardial infarction   Hyperlipidemia Father    Hyperlipidemia Sister    Diabetes  Sister    Bone cancer Other        cousin   Alzheimer's disease Mother        maternal aunts   Skin cancer Mother    Myasthenia gravis Brother        ocular   Skin cancer Brother    Stroke Paternal Grandfather    Breast cancer Neg Hx     ALLERGIES:  has No Known Allergies.  MEDICATIONS:  Current Outpatient Medications  Medication Sig Dispense Refill   acetaminophen (TYLENOL) 325 MG tablet Take 2 tablets (650 mg total) by mouth every 6 (six) hours as needed for mild pain (or Fever >/= 101).     amiodarone (PACERONE) 200 MG tablet Take 1 tablet (200 mg total) by mouth daily. 14 tablet 0   Ascorbic Acid (VITAMIN C) 1000 MG tablet Take 1,000 mg by mouth at bedtime.     B Complex-C (B-COMPLEX WITH VITAMIN C) tablet Take 1 tablet by mouth every  evening.     benzonatate (TESSALON) 100 MG capsule TAKE 1 CAPSULE BY MOUTH TWICE DAILY AS NEEDED COUGH 90 capsule 0   Biotin 10000 MCG TABS Take 10,000 mcg by mouth daily at 2 PM. (1430)     Calcium-Phosphorus-Vitamin D (CITRACAL +D3 PO) Take 1 tablet by mouth daily.     diltiazem (TIAZAC) 180 MG 24 hr capsule Take by mouth.     DULoxetine (CYMBALTA) 30 MG capsule TAKE 1 CAPSULE BY MOUTH ONCE DAILY 30 capsule 2   folic acid (FOLVITE) 1 MG tablet 2 mg every day po 60 tablet 5   Garlic 123XX123 MG CAPS Take 1,000 mg by mouth in the morning, at noon, and at bedtime. (0800, 1430, 2000)     levothyroxine (SYNTHROID) 75 MCG tablet TAKE 1 TABLET BY MOUTH ONCE DAILY ON AN EMPTY STOMACH. WAIT 30 MINUTES BEFORE TAKING OTHER MEDS. 90 tablet 1   methotrexate 2.5 MG tablet TAKE 4 TABLETS BY MOUTH BY MOUTH ONCE A WEEK WITH FOLIC ACID 16 tablet 5   pantoprazole (PROTONIX) 40 MG tablet Take 40 mg by mouth daily.      Probiotic Product (PROBIOTIC MULTI-ENZYME PO) Take 1 capsule by mouth in the morning and at bedtime. Probiotic Multi-Enzyme Digestive Formula     pyridostigmine (MESTINON) 60 MG tablet TAKE 1 TABLET BY MOUTH 3 TIMES DAILY 90 tablet 2   risperiDONE (RISPERDAL) 1 MG tablet Take 1 mg by mouth at bedtime.      tolterodine (DETROL LA) 4 MG 24 hr capsule TAKE 1 CAPSULE BY MOUTH ONCE EVERY MORNING 90 capsule 1   vitamin E 180 MG (400 UNITS) capsule Take 400 Units by mouth daily at 2 PM. 1430     No current facility-administered medications for this visit.     PHYSICAL EXAMINATION: ECOG PERFORMANCE STATUS: 1 - Symptomatic but completely ambulatory Vitals:   10/29/20 1008  BP: (!) 133/92  Pulse: 78  Resp: 20  Temp: 97.8 F (36.6 C)  SpO2: 100%   Filed Weights   10/29/20 1008  Weight: 201 lb 14.4 oz (91.6 kg)    Physical Exam Constitutional:      General: She is not in acute distress.    Appearance: She is obese.  HENT:     Head: Normocephalic and atraumatic.  Eyes:     General: No scleral  icterus. Cardiovascular:     Rate and Rhythm: Normal rate and regular rhythm.     Heart sounds: Normal heart sounds.  Pulmonary:  Effort: Pulmonary effort is normal. No respiratory distress.     Breath sounds: No wheezing.  Abdominal:     General: Bowel sounds are normal. There is no distension.     Palpations: Abdomen is soft.  Musculoskeletal:        General: No deformity. Normal range of motion.     Cervical back: Normal range of motion and neck supple.  Skin:    General: Skin is warm and dry.     Findings: No erythema or rash.  Neurological:     Mental Status: She is alert. Mental status is at baseline.     Cranial Nerves: No cranial nerve deficit.     Coordination: Coordination normal.     Comments: Oriented in person   Psychiatric:        Mood and Affect: Mood normal.    LABORATORY DATA:  I have reviewed the data as listed Lab Results  Component Value Date   WBC 6.2 10/29/2020   HGB 14.1 10/29/2020   HCT 43.0 10/29/2020   MCV 97.7 10/29/2020   PLT 156 10/29/2020   Recent Labs    11/04/19 1725 03/30/20 0846 04/06/20 1124 04/07/20 0535 04/08/20 0413 04/09/20 0456 04/10/20 0422 10/25/20 0934 10/29/20 1003  NA 133*   < > 126* 125*   < > 130* 135  --  139  K 3.5   < > 4.1 4.1   < > 4.9 4.4  --  3.5  CL 96*   < > 92* 92*   < > 99 103  --  100  CO2 26   < > 25 23   < > 25 27  --  31  GLUCOSE 213*   < > 153* 135*   < > 145* 106*  --  102*  BUN 21   < > 21 20   < > 20 21  --  14  CREATININE 0.99   < > 0.99 0.78   < > 0.76 0.78 1.00 0.96  CALCIUM 8.8*   < > 9.0 8.5*   < > 8.3* 8.2*  --  8.6*  GFRNONAA 59*   < > >60 >60   < > >60 >60  --  >60  GFRAA >60  --   --   --   --   --   --   --   --   PROT  --    < > 6.3* 6.1*  --   --   --   --  6.4*  ALBUMIN  --    < > 3.1* 2.7*  --   --   --   --  3.8  AST  --    < > 28 20  --   --   --   --  25  ALT  --    < > 29 24  --   --   --   --  18  ALKPHOS  --    < > 66 58  --   --   --   --  59  BILITOT  --    < > 0.8  0.9  --   --   --   --  0.6   < > = values in this interval not displayed.    Iron/TIBC/Ferritin/ %Sat No results found for: IRON, TIBC, FERRITIN, IRONPCTSAT    RADIOGRAPHIC STUDIES: I have personally reviewed the radiological images as listed and agreed with the findings in the report.[name]  CT CHEST W CONTRAST  Result Date: 10/26/2020 CLINICAL DATA:  70 year old female with history of thymoma. Annual follow-up examination. EXAM: CT CHEST WITH CONTRAST TECHNIQUE: Multidetector CT imaging of the chest was performed during intravenous contrast administration. CONTRAST:  9m OMNIPAQUE IOHEXOL 350 MG/ML SOLN COMPARISON:  Chest CT 05/02/2020. FINDINGS: Cardiovascular: Heart size is normal. No significant volume of pericardial fluid. Some thickening of soft tissues along the superior aspect of the pericardium anteriorly, associated with the residual anterior mediastinal mass-like area, similar to the prior study. No atherosclerotic calcifications in the thoracic aorta or the coronary arteries. Narrowing of the innominate vein and numerous venous collaterals in the paraspinal region, likely sequela of prior radiation therapy. Mediastinum/Nodes: Persistent amorphous soft tissue thickening which is partially calcified in the anterior mediastinum abutting the ascending thoracic aorta, grossly stable compared to the prior examination, corresponding to the treated thymoma. No definitive enhancing soft tissue mass or change in morphology on today's examination to clearly indicate progressive disease. No pathologically enlarged mediastinal or hilar lymph nodes. Esophagus is unremarkable in appearance. No axillary lymphadenopathy. Lungs/Pleura: Similar areas of cylindrical bronchiectasis, thickening of the peribronchovascular interstitium, regional architectural distortion and volume loss and scattered septal thickening noted in the right lung, most evident in the paramediastinal aspect of the right upper lobe. Similar  findings are present to a lesser extent in the medial aspect of the left upper lobe. No acute consolidative airspace disease. No pleural effusions. No definite suspicious appearing pulmonary nodules or masses are noted. No pleural based nodularity. Upper Abdomen: Postprocedural changes of radiofrequency ablation in the left kidney, similar to prior examinations. 2.3 cm simple cyst in the anterior aspect of the interpolar region of the left kidney. Status post cholecystectomy. Musculoskeletal: There are no aggressive appearing lytic or blastic lesions noted in the visualized portions of the skeleton. IMPRESSION: 1. Stable appearance of treated anterior mediastinal mass compatible with reported malignant thymoma. No definitive findings to suggest locally recurrent disease or metastatic disease in the thorax. 2. Chronic narrowing of the innominate vein with venous collaterals in the upper thorax, similar to prior studies, likely sequela of prior radiation therapy. 3. Additional incidental findings, as above. Electronically Signed   By: DVinnie LangtonM.D.   On: 10/26/2020 07:07   MR ABDOMEN WWO CONTRAST  Result Date: 10/07/2020 CLINICAL DATA:  Left renal mass surveillance, surveillance post renal cell carcinoma cryoablation EXAM: MRI ABDOMEN WITHOUT AND WITH CONTRAST TECHNIQUE: Multiplanar multisequence MR imaging of the abdomen was performed both before and after the administration of intravenous contrast. CONTRAST:  115mGADAVIST GADOBUTROL 1 MMOL/ML IV SOLN COMPARISON:  03/23/2020 FINDINGS: Lower chest: No acute findings. Hepatobiliary: No mass or other parenchymal abnormality identified. Status post cholecystectomy. Pancreas: No mass, inflammatory changes, or other parenchymal abnormality identified. Spleen:  Within normal limits in size and appearance. Adrenals/Urinary Tract: Unchanged macroscopic fat containing, definitively benign bilateral adrenal adenomata. Unchanged post ablation appearance of the  posterior inferior pole of the left kidney (series 5, image 29, series 21, image 56). No evidence of recurrent soft tissue or abnormal contrast enhancement. Small, benign fluid signal cysts. No evidence of hydronephrosis. Stomach/Bowel: Visualized portions within the abdomen are unremarkable. Vascular/Lymphatic: No pathologically enlarged lymph nodes identified. No abdominal aortic aneurysm demonstrated. Other:  None. Musculoskeletal: No suspicious bone lesions identified. IMPRESSION: 1. Unchanged post ablation appearance of the posterior inferior pole of the left kidney. No evidence of recurrent soft tissue or abnormal contrast enhancement. 2. No evidence of lymphadenopathy or metastatic disease in the abdomen. Electronically  Signed   By: Eddie Candle M.D.   On: 10/07/2020 14:30   IR Radiologist Eval & Mgmt  Result Date: 10/10/2020 Please refer to notes tab for details about interventional procedure. (Op Note)       ASSESSMENT & PLAN:  1. Renal cell cancer, left ([place])   2. MG (myasthenia gravis) ([place])   3. Thymoma    #Invasive thymoma, S/p 2 cycle of  reduced carboplatin AUC 4, etoposide day 1 to 3 at 75 mg/m, every 3 weeks, and concurrent radiation Labs are reviewed and discussed with patient. 10/25/2020 CT chest w contrast showed stable disease. No signs of recurrence . Continue CT surveillance   #Myasthenia gravis, continue follow-up with neurology. #Left  RCC, patient is status post cryoablation of the lesion.  Recent MRI images were reviewed and discussed with patient.  No evidence of recurrence. Dr.McCullough plans to repeat MRI in 12 months.  Advised patient to follow-up in 6 months. All questions were answered. The patient knows to call the clinic with any problems questions or concerns.   Earlie Server, MD, PhD Hematology Oncology [facility] Pager- [phone]

## 2020-10-30 ENCOUNTER — Other Ambulatory Visit: Payer: Self-pay | Admitting: Internal Medicine

## 2020-10-30 NOTE — Telephone Encounter (Signed)
Patient's sister calling back in. States the Patient has a chronic cough and takes the [employer]. Patient scheduled to be seen in November 2022, Patient last seen 11/10/19.   Okay to fill medication?

## 2020-10-31 ENCOUNTER — Telehealth: Payer: Self-pay

## 2020-10-31 DIAGNOSIS — R0601 Orthopnea: Secondary | ICD-10-CM

## 2020-10-31 NOTE — Progress Notes (Signed)
[facility] TEST REPORT ( by Watch PAT)   STUDY DATE:  10-17-2020 DOB:  02-25-1950 MRN: [id number]   ORDERING CLINICIAN: [name], MD  REFERRING CLINICIAN: Einar Pheasant, MD PCP    CLINICAL INFORMATION/HISTORY: DME.[facility] set up date 03/2015, due for a new machine. Current machine no longer connects to tower and last data was recorded April 2022. Madison Burns uses her machine nightly. Has been snoring while on CPAP. Her current compliance from April is excellent 100% of days the machine was used and 79% over 4 hours with a residual AHI of 1.8/h. Madison Burns only used 5.5 cm water pressure with 3 cm EPR due to a mostly upper airway resistance syndrome at the time that Madison Burns was set up. Madison Burns is sleeping in a hospital bed with elevated head.  08/22/20 Madison Burns is a 70 y.o. female with mental retardation, living with her sister. Has atrial fibrillation, suffered a Pneumonia in 03-2020. Madison Burns has been diagnosed with renal cell cancer and her Myasthenia has taken a turn for the worse. Madison Burns had a malignant thymoma.  Madison Burns is presenting with residual ptosis , neck weakness and left shoulder slump. Madison Burns is no longer able to walk unassisted, presents stooped. Her voice is hoarse. Madison Burns is able to raise her voice and I can understand her quite clearly     Epworth sleepiness score: 14/18 modified for non driver.   BMI: 34.4kg/m Neck Circumference: 16.5"    Sleep Summary:   Total Recording Time (hours, min):    Total recording time for this home sleep test amounted to 7 hours.  Calculated sleep time was 6 hours 30 minutes.  REM sleep proportion was 6.8%.                               Respiratory Indices:   Calculated pAHI (per hour):   The calculated AHI was only 3.5/h there was no breakdown in rem sleep and non-REM sleep possible.  No data of sleep position or snoring were provided either.                                                                       Oxygen Saturation Statistics:         O2 Saturation Range (%):   Oxygen saturation ranged between a nadir of 74% and a maximum saturation of 97% with a mean oxygenation at 93% SPO2.  Time spent under 89% oxygen saturation amounted to 5.2 minutes or 1.4% of sleep time.                                     Pulse Rate Statistics:  Pulse Range:  Heart rate minimum was not stated I think that the pulse oximetry contact was lost after 8 AM in the morning however until then her heart rate showed minimal variability between 60 and 90 bpm.                    IMPRESSION:  This HST does not confirm the presence of sleep  apnea- th AHI is very mild. There were several technical challenges, and apparently the chest wall electrode did not provide data.     RECOMMENDATION: If Jury slept with any oxygen supplementation during the HST , this study would need to be repeated. I would need additional information why the chest wall electrode may not have worked- did Madison Burns sleep in a reclined position?  Based on limited date, this patient would not be prescribed CPAP.     INTERPRETING PHYSICIAN:   [name], MD   Medical Director of [facility] at [facility].

## 2020-10-31 NOTE — Telephone Encounter (Signed)
I called patient.  I spoke with patient sister, [name], per [place].  I discussed patient's sleep study results with her.  Patient sister reports that around 2 AM the chest wall electrode came off.  Her skin was irritated for several days after that.  Patient slept with her head slightly reclined in hospital bed.  I advised her that based on this sleep study the patient would not be prescribed CPAP.  Patient sister would like to return to CPAP.  I advised her that [facility] will be reaching out to her to discuss how to return CPAP.  Patient's sisterverbalized understanding.  A follow-up was made for January with Amy, NP.

## 2020-10-31 NOTE — Telephone Encounter (Signed)
-----   Message from [name], MD sent at 10/31/2020 12:13 PM EDT ----- Pulse Rate Statistics: Pulse Range: Heart rate minimum was not stated- I believe that the pulse oximetry contact was lost after 8 AM in the morning. However, until then her heart rate showed remarkably ( unexpected) minimal variability between 60 and 90 bpm.  IMPRESSION:  This HST does not confirm the presence of sleep apnea- this AHI is very mild, too mild to warrant intervention. There were several technical challenges, and apparently the chest wall electrode did not provide data.    RECOMMENDATION: If Madison Burns slept with any oxygen supplementation during the HST , this study would need to be repeated. I would need additional information why the chest wall electrode may not have worked- did she sleep in a reclined position?  Based on these limited date, this patient would not be prescribed CPAP.    INTERPRETING PHYSICIAN:

## 2020-10-31 NOTE — Procedures (Signed)
[facility] TEST REPORT ( by Watch PAT)   STUDY DATE:  10-17-2020 DOB:  1951/02/03 MRN: [id number]   ORDERING CLINICIAN: [name], MD  REFERRING CLINICIAN: Einar Pheasant, MD PCP    CLINICAL INFORMATION/HISTORY: DME.[facility] ([place]) set up date 03/2015, due for a new machine. Current machine no longer connects to tower and last data was recorded April 2022. She uses her machine nightly. Has been snoring while on CPAP. Her current compliance from April is excellent 100% of days the machine was used and 79% over 4 hours with a residual AHI of 1.8/h. She only used 5.5 cm water pressure with 3 cm EPR due to a mostly upper airway resistance syndrome at the time that she was set up. She is sleeping in a hospital bed with elevated head.  08/22/20 Madison Burns is a 70 y.o. female with mental retardation, living with her sister. Has atrial fibrillation, suffered a Pneumonia in 03-2020. [name] has been diagnosed with renal cell cancer and her Myasthenia has taken a turn for the worse. She had a malignant thymoma.  She is presenting with residual ptosis , neck weakness and left shoulder slump. She is no longer able to walk unassisted, presents stooped. Her voice is hoarse. She is able to raise her voice and I can understand her quite clearly     Epworth sleepiness score: 14/18 modified for non driver.   BMI: 34.4kg/m Neck Circumference: 16.5"    Sleep Summary:   Total Recording Time (hours, min):    Total recording time for this home sleep test amounted to 7 hours.  Calculated sleep time was 6 hours 30 minutes.  REM sleep proportion was 6.8%.                               Respiratory Indices:   Calculated pAHI (per hour):   The calculated AHI was only 3.5/h there was no breakdown in rem sleep and non-REM sleep possible.  No data of sleep position or snoring were provided either.                                                                      Oxygen  Saturation Statistics:         O2 Saturation Range (%):   Oxygen saturation ranged between a nadir of 74% and a maximum saturation of 97% with a mean oxygenation at 93% SPO2.  Time spent under 89% oxygen saturation amounted to 5.2 minutes or 1.4% of sleep time.                                     Pulse Rate Statistics:  Pulse Range:  Heart rate minimum was not stated I think that the pulse oximetry contact was lost after 8 AM in the morning however until then her heart rate showed minimal variability between 60 and 90 bpm.                    IMPRESSION:  This HST does not confirm the presence of sleep apnea- th AHI is very  mild. There were several technical challenges, and apparently the chest wall electrode did not provide data.     RECOMMENDATION: If [name] slept with any oxygen supplementation during the HST , this study would need to be repeated. I would need additional information why the chest wall electrode may not have worked- did she sleep in a reclined position?  Based on limited date, this patient would not be prescribed CPAP.     INTERPRETING PHYSICIAN:   [name], MD   Medical Director of [facility].

## 2020-10-31 NOTE — Progress Notes (Signed)
Pulse Rate Statistics: Pulse Range: Heart rate minimum was not stated- I believe that the pulse oximetry contact was lost after 8 AM in the morning. However, until then her heart rate showed remarkably ( unexpected) minimal variability between 60 and 90 bpm.  IMPRESSION:  This HST does not confirm the presence of sleep apnea- this AHI is very mild, too mild to warrant intervention. There were several technical challenges, and apparently the chest wall electrode did not provide data.    RECOMMENDATION: If Madison Burns slept with any oxygen supplementation during the HST , this study would need to be repeated. I would need additional information why the chest wall electrode may not have worked- did she sleep in a reclined position?  Based on these limited date, this patient would not be prescribed CPAP.    INTERPRETING PHYSICIAN:

## 2020-10-31 NOTE — Telephone Encounter (Signed)
Per chart, medication has  been refilled.

## 2020-11-01 NOTE — Progress Notes (Signed)
CM sent to [facility]  

## 2020-11-07 ENCOUNTER — Other Ambulatory Visit: Payer: Self-pay

## 2020-11-07 ENCOUNTER — Encounter: Payer: Self-pay | Admitting: Radiation Oncology

## 2020-11-07 ENCOUNTER — Ambulatory Visit
Admission: RE | Admit: 2020-11-07 | Discharge: 2020-11-07 | Disposition: A | Discharge status: Home / Self Care | Payer: Medicare Other | Source: Ambulatory Visit | Attending: Radiation Oncology | Admitting: Radiation Oncology

## 2020-11-07 VITALS — BP 118/77 | HR 77 | Temp 97.0°F | Resp 16 | Wt 200.3 lb

## 2020-11-07 DIAGNOSIS — Z923 Personal history of irradiation: Secondary | ICD-10-CM | POA: Diagnosis not present

## 2020-11-07 DIAGNOSIS — Z85238 Personal history of other malignant neoplasm of thymus: Secondary | ICD-10-CM | POA: Insufficient documentation

## 2020-11-07 DIAGNOSIS — R0989 Other specified symptoms and signs involving the circulatory and respiratory systems: Secondary | ICD-10-CM | POA: Diagnosis not present

## 2020-11-07 DIAGNOSIS — C37 Malignant neoplasm of thymus: Secondary | ICD-10-CM

## 2020-11-07 NOTE — Progress Notes (Signed)
Radiation Oncology Follow up Note  Name: Madison Burns   Date:   11/07/2020 MRN:  [id number] DOB: 05-09-1950    This 70 y.o. female presents to the clinic today for 13-month follow-up status post external beam radiation therapy to her mediastinum for a thymoma Masako stage IVa.  REFERRING PROVIDER: Einar Pheasant, MD  HPI: Patient is a 70 year old female now out 16 months having completed external beam radiation therapy from a soccer stage IVa thymoma seen today in routine follow-up she is doing well.  Very little side effects at this point in time mild occasional choking.  Her myasthenia seems to be markedly improved and affect is excellent.  She had a recent CT scan of the chest.  Showing stable appearance of the treated anterior mediastinum compatible with excellent treatment response.  No evidence of recurrent disease or metastatic disease in the chest.  COMPLICATIONS OF TREATMENT: none  FOLLOW UP COMPLIANCE: keeps appointments   PHYSICAL EXAM:  BP 118/77   Pulse 77   Temp (!) 97 F (36.1 C) (Tympanic)   Resp 16   Wt 200 lb 4.8 oz (90.9 kg)   BMI 35.48 kg/m  Well-developed well-nourished patient in NAD. HEENT reveals PERLA, EOMI, discs not visualized.  Oral cavity is clear. No oral mucosal lesions are identified. Neck is clear without evidence of cervical or supraclavicular adenopathy. Lungs are clear to A&P. Cardiac examination is essentially unremarkable with regular rate and rhythm without murmur rub or thrill. Abdomen is benign with no organomegaly or masses noted. Motor sensory and DTR levels are equal and symmetric in the upper and lower extremities. Cranial nerves II through XII are grossly intact. Proprioception is intact. No peripheral adenopathy or edema is identified. No motor or sensory levels are noted. Crude visual fields are within normal range.  RADIOLOGY RESULTS: CT scan reviewed compatible with above-stated findings  PLAN: Present time patient is doing clinically  excellent.  She clinically has no evidence of disease.  Clinically doing extremely well.  I have asked to see her back in 1 year for follow-up.  She continues CT surveillance and close observation by medical oncology.  Patient and family know to call with any concerns.  I would like to take this opportunity to thank you for allowing me to participate in the care of your patient.Noreene Filbert, MD

## 2020-11-27 ENCOUNTER — Telehealth: Payer: Self-pay | Admitting: Neurology

## 2020-11-27 ENCOUNTER — Other Ambulatory Visit: Payer: Self-pay | Admitting: Neurology

## 2020-11-27 MED ORDER — METHOTREXATE SODIUM 2.5 MG PO TABS: ORAL_TABLET | ORAL | 5 refills | Status: DC

## 2020-11-27 NOTE — Telephone Encounter (Signed)
Patient on MTX for several years, needs to continue until next visit with either oncology or neurology.

## 2020-12-05 ENCOUNTER — Other Ambulatory Visit: Payer: Self-pay | Admitting: Internal Medicine

## 2020-12-05 ENCOUNTER — Other Ambulatory Visit: Payer: Self-pay | Admitting: Neurology

## 2020-12-17 ENCOUNTER — Other Ambulatory Visit: Payer: Self-pay | Admitting: Oncology

## 2020-12-17 ENCOUNTER — Other Ambulatory Visit: Payer: Self-pay | Admitting: Internal Medicine

## 2020-12-21 ENCOUNTER — Other Ambulatory Visit: Payer: Self-pay | Admitting: Interventional Radiology

## 2020-12-21 DIAGNOSIS — N2889 Other specified disorders of kidney and ureter: Secondary | ICD-10-CM

## 2021-01-03 ENCOUNTER — Other Ambulatory Visit: Payer: Self-pay

## 2021-01-03 ENCOUNTER — Ambulatory Visit (INDEPENDENT_AMBULATORY_CARE_PROVIDER_SITE_OTHER): Payer: Medicare Other | Admitting: Internal Medicine

## 2021-01-03 VITALS — BP 126/70 | HR 86 | Temp 97.2°F | Resp 16 | Ht 63.0 in | Wt 205.2 lb

## 2021-01-03 DIAGNOSIS — I4891 Unspecified atrial fibrillation: Secondary | ICD-10-CM | POA: Diagnosis not present

## 2021-01-03 DIAGNOSIS — G7000 Myasthenia gravis without (acute) exacerbation: Secondary | ICD-10-CM

## 2021-01-03 DIAGNOSIS — E7800 Pure hypercholesterolemia, unspecified: Secondary | ICD-10-CM

## 2021-01-03 DIAGNOSIS — E039 Hypothyroidism, unspecified: Secondary | ICD-10-CM

## 2021-01-03 DIAGNOSIS — G40909 Epilepsy, unspecified, not intractable, without status epilepticus: Secondary | ICD-10-CM

## 2021-01-03 DIAGNOSIS — C649 Malignant neoplasm of unspecified kidney, except renal pelvis: Secondary | ICD-10-CM

## 2021-01-03 DIAGNOSIS — I10 Essential (primary) hypertension: Secondary | ICD-10-CM | POA: Diagnosis not present

## 2021-01-03 DIAGNOSIS — K589 Irritable bowel syndrome without diarrhea: Secondary | ICD-10-CM

## 2021-01-03 DIAGNOSIS — Z9989 Dependence on other enabling machines and devices: Secondary | ICD-10-CM

## 2021-01-03 DIAGNOSIS — D696 Thrombocytopenia, unspecified: Secondary | ICD-10-CM

## 2021-01-03 DIAGNOSIS — T451X5A Adverse effect of antineoplastic and immunosuppressive drugs, initial encounter: Secondary | ICD-10-CM

## 2021-01-03 DIAGNOSIS — Z1231 Encounter for screening mammogram for malignant neoplasm of breast: Secondary | ICD-10-CM | POA: Diagnosis not present

## 2021-01-03 DIAGNOSIS — F39 Unspecified mood [affective] disorder: Secondary | ICD-10-CM

## 2021-01-03 DIAGNOSIS — D6481 Anemia due to antineoplastic chemotherapy: Secondary | ICD-10-CM

## 2021-01-03 DIAGNOSIS — R739 Hyperglycemia, unspecified: Secondary | ICD-10-CM

## 2021-01-03 DIAGNOSIS — G4733 Obstructive sleep apnea (adult) (pediatric): Secondary | ICD-10-CM | POA: Diagnosis not present

## 2021-01-03 NOTE — Progress Notes (Signed)
Patient ID: Madison Burns, female   DOB: 1950/04/25, 70 y.o.   MRN: [id number]   Subjective:    Patient ID: Madison Burns, female    DOB: 10-30-1950, 70 y.o.   MRN: [id number]  This visit occurred during the SARS-CoV-2 public health emergency.  Safety protocols were in place, including screening questions prior to the visit, additional usage of staff PPE, and extensive cleaning of exam room while observing appropriate contact time as indicated for disinfecting solutions.   Patient here for her scheduled physical  Chief Complaint  Patient presents with   Annual Exam   .   HPI She is accompanied by her sister.  History obtained from both of them.  Saw cardiology 11/28/20.  Stable.  Continue amiodarone and cardizem.  Also seeing oncology.  Last evaluated 10/29/20.  F/u invasive thymoma.  Chemo.  Recommended continue CT surveillance.  Also being followed by neurology - treatment of MG.  Urology following left RCC.  Recommended f/u MRI in 12 months.  Seeing GI - f/u IBS.  Recommended IBGard and citrucel.  Overall appears to be doing well.  Sleeping better with cpap.  No chest pain reported.  Breathing overall stable.  No nausea or vomiting reported.  No abdominal pain or bowel change reported.     Past Medical History:  Diagnosis Date   Cancer of kidney ([place]) 6153   Complication of anesthesia    Myasthenia gravis   GERD (gastroesophageal reflux disease)    History of seizure disorder    Hypercholesterolemia    Hypertension    no longer has it. wt loss   Hypothyroidism    Irritable bowel syndrome    Melanoma ([place]) 2017   Melanoma on neck   Myasthenia gravis ([place]) 2011   OSA on CPAP    Thymoma, malignant ([place])    Past Surgical History:  Procedure Laterality Date   [place]  2013   COLONOSCOPY WITH PROPOFOL N/A 06/30/2017   Procedure: COLONOSCOPY WITH PROPOFOL;  Surgeon: Toledo, Benay Pike, MD;  Location: [facility] ENDOSCOPY;   Service: Gastroenterology;  Laterality: N/A;   CYSTOSCOPY W/ URETERAL STENT PLACEMENT Left 08/17/2019   Procedure: CYSTOSCOPY WITH RETROGRADE PYELOGRAM/URETERAL STENT PLACEMENT;  Surgeon: Alexis Frock, MD;  Location: [facility];  Service: Urology;  Laterality: Left;  30 MINS   ESOPHAGOGASTRODUODENOSCOPY (EGD) WITH PROPOFOL N/A 06/30/2017   Procedure: ESOPHAGOGASTRODUODENOSCOPY (EGD) WITH PROPOFOL;  Surgeon: Toledo, Benay Pike, MD;  Location: [facility] ENDOSCOPY;  Service: Gastroenterology;  Laterality: N/A;   IR RADIOLOGIST EVAL & MGMT  06/28/2019   IR RADIOLOGIST EVAL & MGMT  09/14/2019   IR RADIOLOGIST EVAL & MGMT  03/28/2020   IR RADIOLOGIST EVAL & MGMT  10/10/2020   RADIOFREQUENCY ABLATION Left 08/17/2019   Procedure: LEFT RENAL CRYO ABLATION;  Surgeon: Jacqulynn Cadet, MD;  Location: [facility];  Service: Anesthesiology;  Laterality: Left;   TONSILLECTOMY  1959   Family History  Problem Relation Age of Onset   Colon cancer Maternal Grandfather        stomach/colon   Heart disease Father        myocardial infarction   Hyperlipidemia Father    Hyperlipidemia Sister    Diabetes Sister    Bone cancer Other        cousin   Alzheimer's disease Mother        maternal aunts   Skin cancer Mother    Myasthenia gravis Brother  ocular   Skin cancer Brother    Stroke Paternal Grandfather    Breast cancer Neg Hx    Social History   Socioeconomic History   Marital status: Single    Spouse name: Not on file   Number of children: 0   Years of education: Special Ed   Highest education level: Not on file  Occupational History   Occupation: Unemployed  Tobacco Use   Smoking status: Never   Smokeless tobacco: Never  Vaping Use   Vaping Use: Never used  Substance and Sexual Activity   Alcohol use: No    Alcohol/week: 0.0 standard drinks   Drug use: No   Sexual activity: Not Currently  Other Topics Concern   Not on file  Social History Narrative   06/23/17 lives with sister, [name]   Regular  exercise-no   Caffeine Use-yes   Social Determinants of Health   Financial Resource Strain: Not on file  Food Insecurity: Not on file  Transportation Needs: Not on file  Physical Activity: Not on file  Stress: Not on file  Social Connections: Not on file     Review of Systems  Constitutional:  Negative for fatigue and unexpected weight change.  HENT:  Negative for congestion, sinus pressure and sore throat.   Eyes:  Negative for pain and visual disturbance.  Respiratory:  Negative for cough, chest tightness and shortness of breath.   Cardiovascular:  Negative for chest pain, palpitations and leg swelling.  Gastrointestinal:  Negative for abdominal pain, nausea and vomiting.  Genitourinary:  Negative for difficulty urinating and dysuria.  Musculoskeletal:  Negative for joint swelling and myalgias.  Skin:  Negative for color change and rash.  Neurological:  Negative for dizziness, light-headedness and headaches.  Hematological:  Negative for adenopathy. Does not bruise/bleed easily.  Psychiatric/Behavioral:  Negative for agitation and dysphoric mood.       Objective:     BP 126/70   Pulse 86   Temp (!) 97.2 F (36.2 C)   Resp 16   Ht 5' 3"  (1.6 m)   Wt 205 lb 3.2 oz (93.1 kg)   SpO2 98%   BMI 36.35 kg/m  Wt Readings from Last 3 Encounters:  01/03/21 205 lb 3.2 oz (93.1 kg)  11/07/20 200 lb 4.8 oz (90.9 kg)  10/29/20 201 lb 14.4 oz (91.6 kg)    Physical Exam Vitals reviewed.  Constitutional:      General: She is not in acute distress.    Appearance: Normal appearance.  HENT:     Head: Normocephalic and atraumatic.     Right Ear: External ear normal.     Left Ear: External ear normal.  Eyes:     General: No scleral icterus.       Right eye: No discharge.        Left eye: No discharge.     Conjunctiva/sclera: Conjunctivae normal.  Neck:     Thyroid: No thyromegaly.  Cardiovascular:     Rate and Rhythm: Normal rate and regular rhythm.  Pulmonary:      Effort: No respiratory distress.     Breath sounds: Normal breath sounds. No wheezing.  Abdominal:     General: Bowel sounds are normal.     Palpations: Abdomen is soft.     Tenderness: There is no abdominal tenderness.  Musculoskeletal:        General: No swelling or tenderness.     Cervical back: Neck supple. No tenderness.  Lymphadenopathy:  Cervical: No cervical adenopathy.  Skin:    Findings: No erythema or rash.  Neurological:     Mental Status: She is alert.  Psychiatric:        Mood and Affect: Mood normal.        Behavior: Behavior normal.     Outpatient Encounter Medications as of 01/03/2021  Medication Sig   acetaminophen (TYLENOL) 325 MG tablet Take 2 tablets (650 mg total) by mouth every 6 (six) hours as needed for mild pain (or Fever >/= 101).   amiodarone (PACERONE) 200 MG tablet Take 1 tablet (200 mg total) by mouth daily.   Ascorbic Acid (VITAMIN C) 1000 MG tablet Take 1,000 mg by mouth at bedtime.   B Complex-C (B-COMPLEX WITH VITAMIN C) tablet Take 1 tablet by mouth every evening.   benzonatate (TESSALON) 100 MG capsule TAKE 1 CAPSULE BY MOUTH TWICE DAILY AS NEEDED COUGH   Biotin 10000 MCG TABS Take 10,000 mcg by mouth daily at 2 PM. (1430)   Calcium-Phosphorus-Vitamin D (CITRACAL +D3 PO) Take 1 tablet by mouth daily.   diltiazem (TIAZAC) 180 MG 24 hr capsule Take by mouth.   DULoxetine (CYMBALTA) 30 MG capsule TAKE 1 CAPSULE BY MOUTH ONCE DAILY   folic acid (FOLVITE) 1 MG tablet TAKE 2 TABLETS BY MOUTH ONCE DAILY   Garlic 1027 MG CAPS Take 1,000 mg by mouth in the morning, at noon, and at bedtime. (0800, 1430, 2000)   levothyroxine (SYNTHROID) 75 MCG tablet TAKE 1 TABLET BY MOUTH ONCE DAILY ON AN EMPTY STOMACH. WAIT 30 MINUTES BEFORE TAKING OTHER MEDS.   methotrexate 2.5 MG tablet TAKE 4 TABLETS BY MOUTH BY MOUTH ONCE A WEEK WITH FOLIC ACID   pantoprazole (PROTONIX) 40 MG tablet Take 40 mg by mouth daily.    Probiotic Product (PROBIOTIC MULTI-ENZYME PO)  Take 1 capsule by mouth in the morning and at bedtime. Probiotic Multi-Enzyme Digestive Formula   pyridostigmine (MESTINON) 60 MG tablet TAKE 1 TABLET BY MOUTH 3 TIMES DAILY   risperiDONE (RISPERDAL) 1 MG tablet Take 1 mg by mouth at bedtime.    tolterodine (DETROL Burns) 4 MG 24 hr capsule TAKE 1 CAPSULE BY MOUTH ONCE EVERY MORNING   vitamin E 180 MG (400 UNITS) capsule Take 400 Units by mouth daily at 2 PM. 1430   No facility-administered encounter medications on file as of 01/03/2021.     Lab Results  Component Value Date   WBC 6.2 10/29/2020   HGB 14.1 10/29/2020   HCT 43.0 10/29/2020   PLT 156 10/29/2020   GLUCOSE 102 (H) 10/29/2020   CHOL 197 12/14/2018   TRIG 210.0 (H) 12/14/2018   HDL 36.80 (L) 12/14/2018   LDLDIRECT 122.0 12/14/2018   LDLCALC 110 (H) 08/06/2018   ALT 18 10/29/2020   AST 25 10/29/2020   NA 139 10/29/2020   K 3.5 10/29/2020   CL 100 10/29/2020   CREATININE 0.96 10/29/2020   BUN 14 10/29/2020   CO2 31 10/29/2020   TSH 4.334 04/07/2020   INR 1.0 10/23/2019   HGBA1C 5.4 12/14/2018       Assessment & Plan:   Problem List Items Addressed This Visit     Anemia due to chemotherapy for renal cell cancer treated with erythropoietin ([place])    Followed by oncology.       Atrial fibrillation with [facility] [place])    Seeing cardiology.  On amiodarone and cardizem.  Appears to be in SR.  Follow.       Hypercholesterolemia  Declines cholesterol medication.  Low cholesterol diet and exercise.  Follow lipid panel.       Relevant Orders   Lipid panel   TSH   Hyperglycemia    Low carb diet and exercise.  Follow met b and a1c.   Lab Results  Component Value Date   HGBA1C 5.4 12/14/2018       Relevant Orders   Hemoglobin A1c   Hypertension    Blood pressure has been doing well. On oral cardizem.  Follow pressures and metabolic panel.       Hypothyroidism    On thyroid replacement.  Follow tsh.       Irritable bowel    citrucel and IBGard.         Mood disorder ([place])    Followed by psychiatry.       Myasthenia gravis ([place])    Followed by neurology.  Stable.       OSA on CPAP    Sleeping better.  Follow.       Seizure disorder ([place])    Followed by neurology.        Thrombocytopenia ([place])    Followed by oncology.       Other Visit Diagnoses     Encounter for screening mammogram for malignant neoplasm of breast    -  Primary   Relevant Orders   MM 3D SCREEN BREAST BILATERAL        Einar Pheasant, MD

## 2021-01-11 ENCOUNTER — Telehealth: Payer: Self-pay | Admitting: Internal Medicine

## 2021-01-11 ENCOUNTER — Encounter: Payer: Self-pay | Admitting: Internal Medicine

## 2021-01-11 NOTE — Assessment & Plan Note (Signed)
Followed by oncology 

## 2021-01-11 NOTE — Assessment & Plan Note (Signed)
citrucel and IBGard.

## 2021-01-11 NOTE — Assessment & Plan Note (Signed)
Followed by neurology.   

## 2021-01-11 NOTE — Assessment & Plan Note (Signed)
On thyroid replacement.  Follow tsh.  

## 2021-01-11 NOTE — Assessment & Plan Note (Signed)
Declines cholesterol medication.  Low cholesterol diet and exercise.  Follow lipid panel.   

## 2021-01-11 NOTE — Assessment & Plan Note (Signed)
Followed by neurology.  Stable  

## 2021-01-11 NOTE — Assessment & Plan Note (Signed)
Blood pressure has been doing well. On oral cardizem.  Follow pressures and metabolic panel.  

## 2021-01-11 NOTE — Telephone Encounter (Signed)
I have placed the lab orders.  Needs Lipid panel, a1c and tsh drawn with next labs drawn through cancer center.

## 2021-01-11 NOTE — Assessment & Plan Note (Signed)
Sleeping better.  Follow.  

## 2021-01-11 NOTE — Assessment & Plan Note (Signed)
Low carb diet and exercise.  Follow met b and a1c.   Lab Results  Component Value Date   HGBA1C 5.4 12/14/2018

## 2021-01-11 NOTE — Assessment & Plan Note (Signed)
Followed by psychiatry 

## 2021-01-11 NOTE — Assessment & Plan Note (Signed)
Seeing cardiology.  On amiodarone and cardizem.  Appears to be in SR.  Follow.  

## 2021-01-18 NOTE — Telephone Encounter (Signed)
Patient has been informed of message.

## 2021-01-24 ENCOUNTER — Other Ambulatory Visit: Payer: Self-pay | Admitting: *Deleted

## 2021-01-24 DIAGNOSIS — G40309 Generalized idiopathic epilepsy and epileptic syndromes, not intractable, without status epilepticus: Secondary | ICD-10-CM

## 2021-01-24 DIAGNOSIS — F73 Profound intellectual disabilities: Secondary | ICD-10-CM

## 2021-01-24 DIAGNOSIS — G4733 Obstructive sleep apnea (adult) (pediatric): Secondary | ICD-10-CM

## 2021-01-24 MED ORDER — PYRIDOSTIGMINE BROMIDE 60 MG PO TABS: 60.0000 mg | ORAL_TABLET | Freq: Three times a day (TID) | ORAL | 1 refills | Status: DC

## 2021-01-31 ENCOUNTER — Other Ambulatory Visit: Payer: Self-pay | Admitting: Internal Medicine

## 2021-02-06 ENCOUNTER — Telehealth: Payer: Self-pay | Admitting: Internal Medicine

## 2021-02-14 ENCOUNTER — Other Ambulatory Visit: Payer: Self-pay | Admitting: Internal Medicine

## 2021-02-14 ENCOUNTER — Other Ambulatory Visit: Payer: Self-pay | Admitting: Adult Health

## 2021-02-14 MED ORDER — BENZONATATE 100 MG PO CAPS
ORAL_CAPSULE | ORAL | 0 refills | Status: DC
Start: 2021-02-14 — End: 2021-09-24

## 2021-02-14 NOTE — Progress Notes (Signed)
Meds ordered this encounter  Medications   benzonatate (TESSALON) 100 MG capsule    Sig: TAKE 1 CAPSULE BY MOUTH TWICE DAILY AS NEEDED COUGH    Dispense:  90 capsule    Refill:  0   Chronic medication refill.  Sent message to Larena Glassman CMA to verify no new symptoms and if so to be seen immediately.

## 2021-02-14 NOTE — Telephone Encounter (Signed)
Yes if PRN for cough ok to refill Benzonatate. I will send.  If any new symptoms  or worsening symptoms she should be evaluated in office or UC if needed.  Follow treatment plan from office  if not improving or any worsening within 72 hours and also return to office or open medical facility at ANYTIME if any symptoms persist, change, or worsen or you have any further concerns or questions. Call 911 immediately for emergencies.

## 2021-02-14 NOTE — Telephone Encounter (Signed)
Patient uses prn for her cough. Ok to send in?

## 2021-02-14 NOTE — Telephone Encounter (Signed)
Patient sister [name] called in still need a refill on medication Benzonatate 100MG  . It need to be sent to [facility] in [place]

## 2021-02-15 NOTE — Telephone Encounter (Signed)
Madison Burns is aware of below.

## 2021-02-21 ENCOUNTER — Telehealth: Payer: Self-pay | Admitting: Family Medicine

## 2021-02-21 NOTE — Telephone Encounter (Signed)
Called the sister. The pt had a refill sent to [facility] in October with 5 refills. There was no answer, lvm making the sister aware to contact the pharmacy first as they should have this already on file and should be able to fill for her. Instructed her to have the pharmacy contact us if they don't have one on file and need [place] to resend.

## 2021-02-27 ENCOUNTER — Emergency Department
Admission: EM | Admit: 2021-02-27 | Discharge: 2021-02-27 | Disposition: A | Discharge status: Home / Self Care | Payer: Medicare Other | Attending: Emergency Medicine | Admitting: Emergency Medicine

## 2021-02-27 ENCOUNTER — Emergency Department: Payer: Medicare Other

## 2021-02-27 ENCOUNTER — Other Ambulatory Visit: Payer: Self-pay

## 2021-02-27 DIAGNOSIS — S0242XA Fracture of alveolus of maxilla, initial encounter for closed fracture: Secondary | ICD-10-CM | POA: Diagnosis not present

## 2021-02-27 DIAGNOSIS — M25561 Pain in right knee: Secondary | ICD-10-CM | POA: Diagnosis not present

## 2021-02-27 DIAGNOSIS — S62639B Displaced fracture of distal phalanx of unspecified finger, initial encounter for open fracture: Secondary | ICD-10-CM

## 2021-02-27 DIAGNOSIS — W19XXXA Unspecified fall, initial encounter: Secondary | ICD-10-CM

## 2021-02-27 DIAGNOSIS — S62663B Nondisplaced fracture of distal phalanx of left middle finger, initial encounter for open fracture: Secondary | ICD-10-CM | POA: Insufficient documentation

## 2021-02-27 DIAGNOSIS — S62665B Nondisplaced fracture of distal phalanx of left ring finger, initial encounter for open fracture: Secondary | ICD-10-CM | POA: Diagnosis not present

## 2021-02-27 DIAGNOSIS — S61213A Laceration without foreign body of left middle finger without damage to nail, initial encounter: Secondary | ICD-10-CM

## 2021-02-27 DIAGNOSIS — W01118A Fall on same level from slipping, tripping and stumbling with subsequent striking against other sharp object, initial encounter: Secondary | ICD-10-CM | POA: Insufficient documentation

## 2021-02-27 DIAGNOSIS — Z23 Encounter for immunization: Secondary | ICD-10-CM | POA: Insufficient documentation

## 2021-02-27 DIAGNOSIS — M79642 Pain in left hand: Secondary | ICD-10-CM | POA: Diagnosis present

## 2021-02-27 DIAGNOSIS — M8580 Other specified disorders of bone density and structure, unspecified site: Secondary | ICD-10-CM | POA: Diagnosis not present

## 2021-02-27 DIAGNOSIS — S01511A Laceration without foreign body of lip, initial encounter: Secondary | ICD-10-CM | POA: Insufficient documentation

## 2021-02-27 DIAGNOSIS — S61215A Laceration without foreign body of left ring finger without damage to nail, initial encounter: Secondary | ICD-10-CM

## 2021-02-27 IMAGING — CT CT HEAD W/O CM
4 series · 16 of 47 positions shown, 18 images · non-contrast
Comparison: None.

CLINICAL DATA: Trauma.

EXAM:
CT HEAD WITHOUT CONTRAST
CT MAXILLOFACIAL WITHOUT CONTRAST
CT CERVICAL SPINE WITHOUT CONTRAST
TECHNIQUE: Multidetector CT imaging of the head, cervical spine, and
maxillofacial structures were performed using the standard protocol
without intravenous contrast. Multiplanar CT image reconstructions
of the cervical spine and maxillofacial structures were also
generated.

[Series 2: head bone · axial · 0.45mm/px · z∈[-95,-67]mm · 3 of 72 slices shown]
[im 8/72  bone]
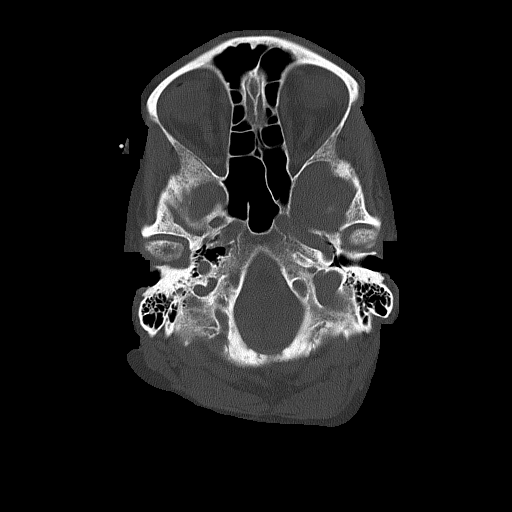
[im 15/72  bone]
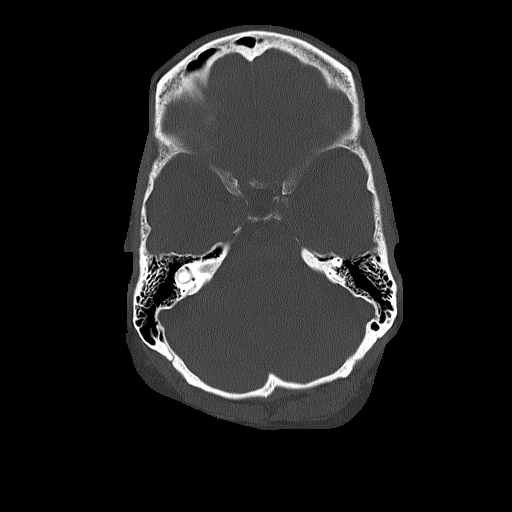
[im 22/72  bone]
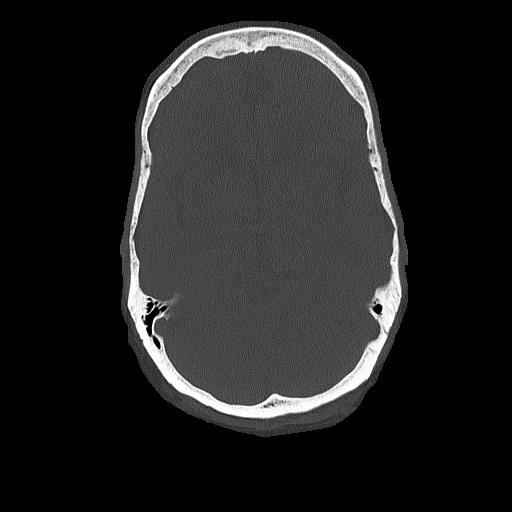

[Series 3: head wo · axial · 0.45mm/px · z∈[-94,+11]mm · 7 of 29 slices shown, 9 images]
[im 4/29  brain]
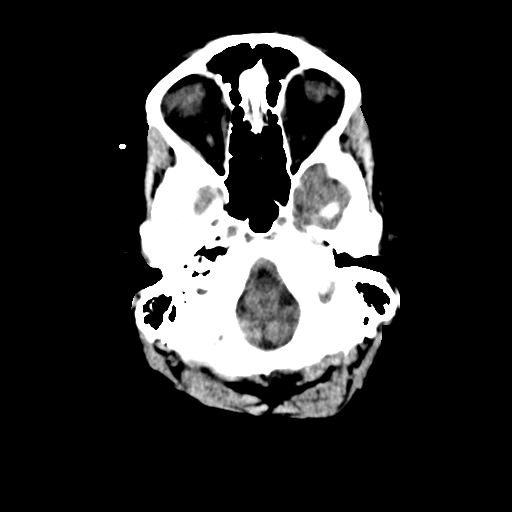
[im 4/29  bone]
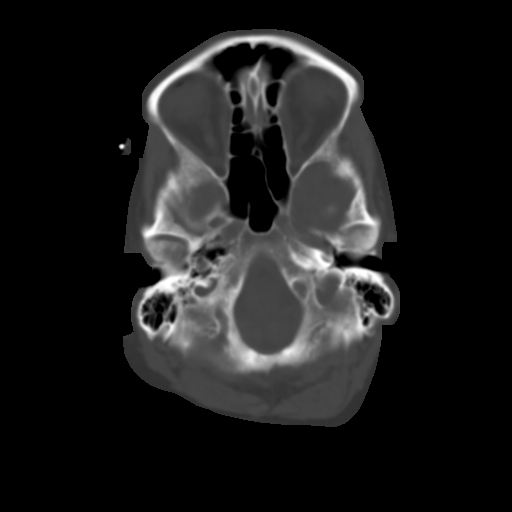
[im 8/29  brain]
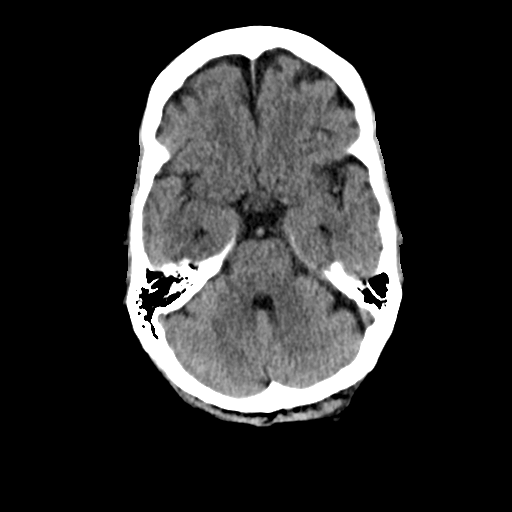
[im 11/29  brain]
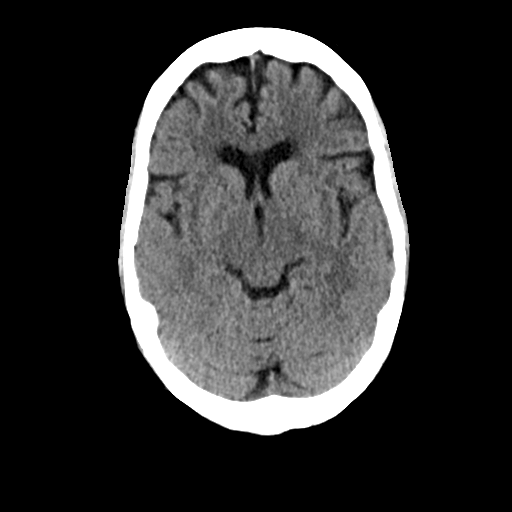
[im 15/29  brain]
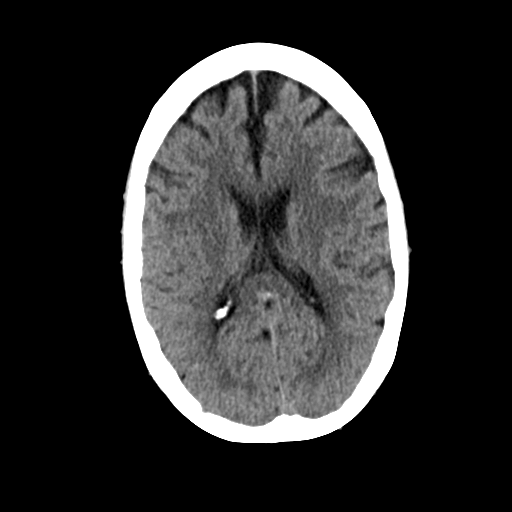
[im 18/29  brain]
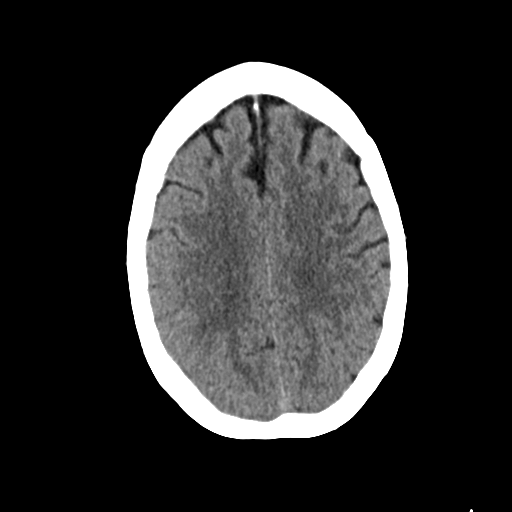
[im 18/29  bone]
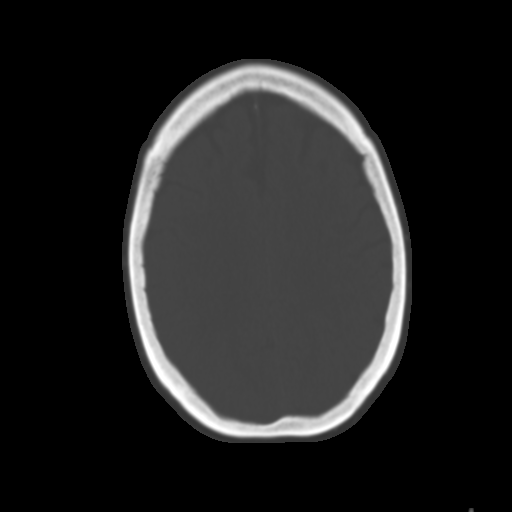
[im 22/29  brain]
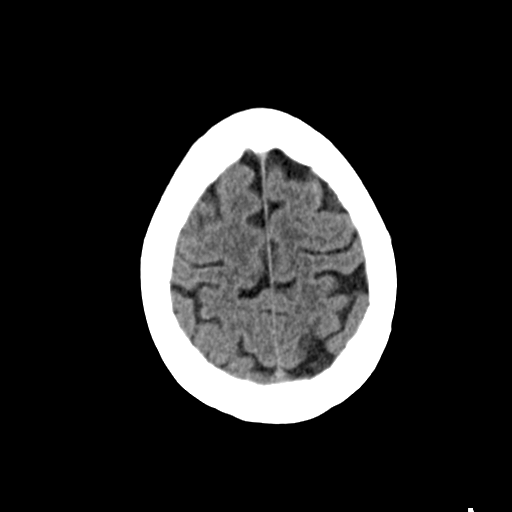
[im 25/29  brain]
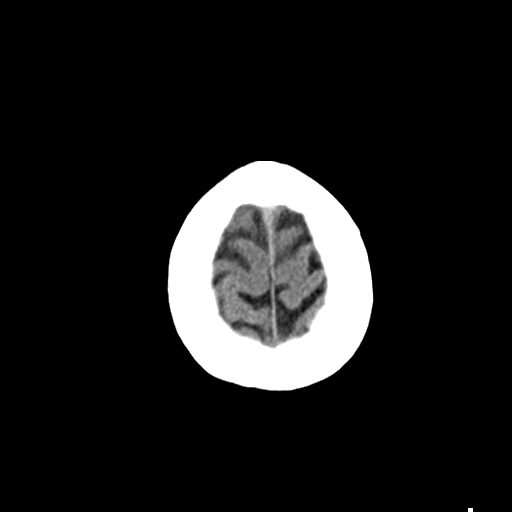

[Series 4: coronal soft tissue · coronal · 0.32mm/px · 3 of 66 slices shown]
[im 22/66  brain]
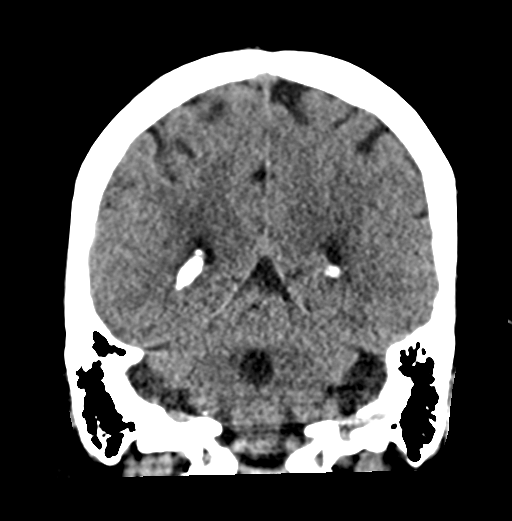
[im 29/66  brain]
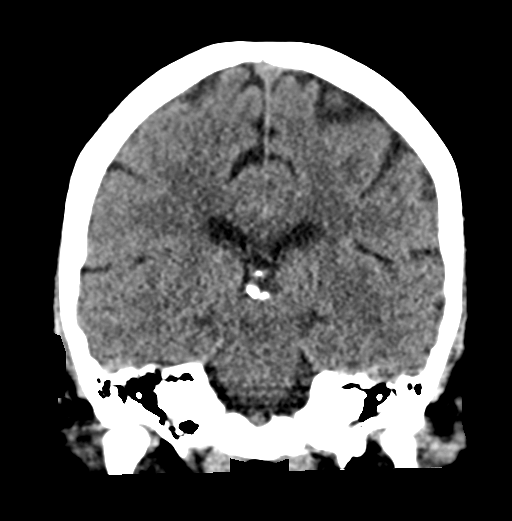
[im 37/66  brain]
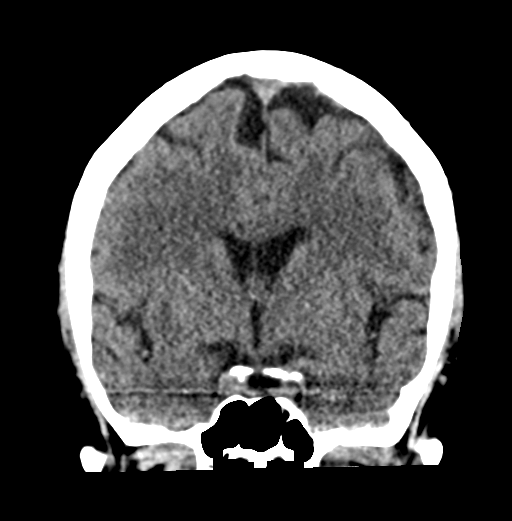

[Series 5: sagittal soft tissue · sagittal · 0.32mm/px · 3 of 51 slices shown]
[im 17/51  brain]
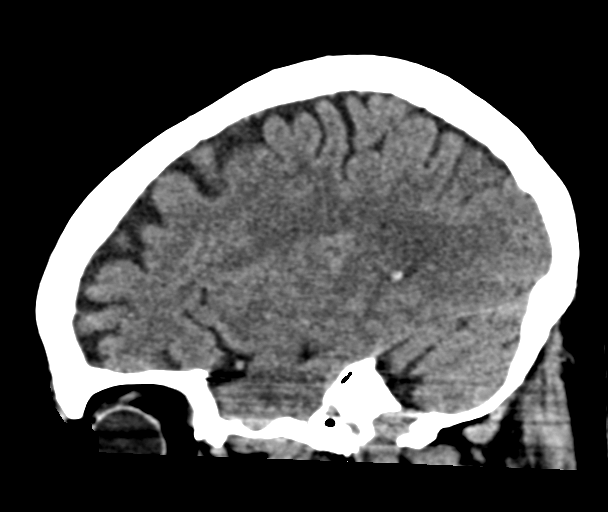
[im 26/51  brain]
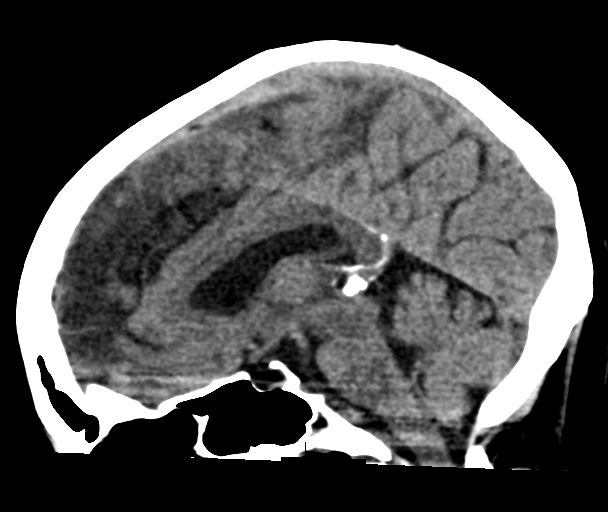
[im 34/51  brain]
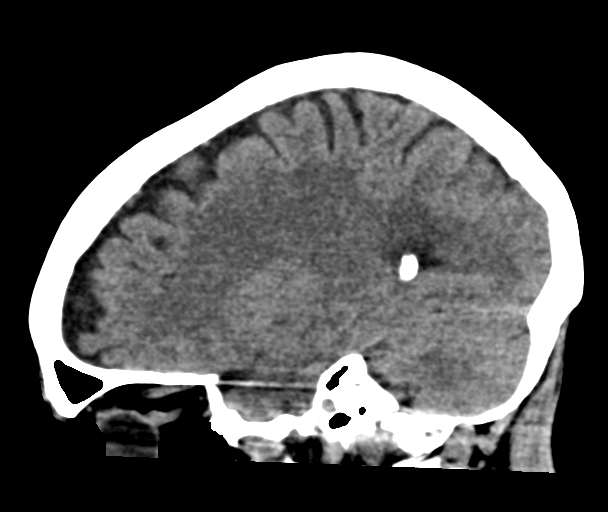

[16 of 47 positions shown; findings below may reference images not displayed]

FINDINGS: CT HEAD FINDINGS

Brain: The ventricles and sulci are appropriate size for the
patient's age. The gray-white matter discrimination is preserved.
There is no acute intracranial hemorrhage. No mass effect or midline
shift. No extra-axial fluid collection.

Vascular: No hyperdense vessel or unexpected calcification.

Skull: Normal. Negative for fracture or focal lesion.

Other: None.

CT MAXILLOFACIAL FINDINGS

Osseous: There is fracture of the anterior alveolar process
involving the maxillary central incisor teeth. Clinical correlation
is recommended. No other acute fracture. No mandibular dislocation.

Orbits: The globes and retro-orbital fat are preserved.

Sinuses: Clear.

Soft tissues: None

CT CERVICAL SPINE FINDINGS

Alignment: No acute subluxation.

Skull base and vertebrae: No acute fracture.  Osteopenia.

Soft tissues and spinal canal: No prevertebral fluid or swelling. No
visible canal hematoma.

Disc levels:  Degenerative changes.

Upper chest: Negative.

Other: None
IMPRESSION: 1. No acute intracranial pathology.
2. No acute/traumatic cervical spine pathology.
3. Fracture of the anterior alveolar process involving the maxillary
central incisor teeth.

## 2021-02-27 MED ORDER — HYDROCODONE-ACETAMINOPHEN 5-325 MG PO TABS: 1.0000 | ORAL_TABLET | ORAL | 0 refills | Status: DC | PRN

## 2021-02-27 MED ORDER — LIDOCAINE HCL (PF) 1 % IJ SOLN
15.0000 mL | Freq: Once | INTRAMUSCULAR | Status: AC
  Administered 2021-02-27: 15 mL
  Filled 2021-02-27: qty 15

## 2021-02-27 MED ORDER — TETANUS-DIPHTH-ACELL PERTUSSIS 5-2.5-18.5 LF-MCG/0.5 IM SUSY
0.5000 mL | PREFILLED_SYRINGE | Freq: Once | INTRAMUSCULAR | Status: AC
  Administered 2021-02-27: 0.5 mL via INTRAMUSCULAR
  Filled 2021-02-27: qty 0.5

## 2021-02-27 MED ORDER — SULFAMETHOXAZOLE-TRIMETHOPRIM 800-160 MG PO TABS: 1.0000 | ORAL_TABLET | Freq: Two times a day (BID) | ORAL | 0 refills | Status: DC

## 2021-02-27 MED ORDER — CEPHALEXIN 500 MG PO CAPS: 1000.0000 mg | ORAL_CAPSULE | Freq: Two times a day (BID) | ORAL | 0 refills | Status: DC

## 2021-02-27 NOTE — ED Provider Notes (Signed)
[facility] Provider Note  Patient Contact: 5:00 PM (approximate)   History   Fall and Laceration   HPI  Madison Burns is a 71 y.o. female who presents to the emergency department complaining of bilateral hand pain, right knee pain, "busted lip."  Primary historian is the patient's older sister.  Patient has profound intellectual disability as well as a history of myasthenia gravis, GERD, hypothyroidism, A. fib, thrombocytopenia.  Patient was pushing a trash can when she tripped and fell.  She did hit her face but did not lose consciousness.  Patient has a "busted" lip to the upper lip.  No bleeding from area.  She is not complaining of any dental pain or facial pain to the sister.  Patient has edema, ecchymosis of the right hand as well as to lacerations to the third and fourth digit of the left hand.  Complaining of right knee pain as well.  No complaints of back pain or hip pain at this time.  No subsequent loss of consciousness.  Bleeding was difficult to control and was wrapped with a pressure dressing on arrival.  There is no bleedthrough of this dressing at this time.     Physical Exam   Triage Vital Signs: ED Triage Vitals  Enc Vitals Group     BP 02/27/21 1619 107/62     Pulse Rate 02/27/21 1619 90     Resp 02/27/21 1619 20     Temp 02/27/21 1619 98 F (36.7 C)     Temp src --      SpO2 02/27/21 1619 97 %     Weight 02/27/21 1624 220 lb (99.8 kg)     Height 02/27/21 1624 5\' 3"  (1.6 m)     Head Circumference --      Peak Flow --      Pain Score --      Pain Loc --      Pain Edu? --      Excl. in [place]? --     Most recent vital signs: Vitals:   02/27/21 1619  BP: 107/62  Pulse: 90  Resp: 20  Temp: 98 F (36.7 C)  SpO2: 97%     General: Alert and in no acute distress. Eyes:  PERRL. EOMI. Head: Edema and ecchymosis of the upper lip.  Small abrasion along the inside upper lip but no frank laceration.  No obvious dental trauma.  Does not  appear to be tender to palpation over the osseous structures of the face or skull.  No battle signs, raccoon eyes, serosanguineous fluid drainage from the ears or nares. ENT:      Ears:       Nose: No congestion/rhinnorhea.      Mouth/Throat: Mucous membranes are moist.  Small inner upper lip laceration measuring approximately 0.75 cm.  No flap.  It does not penetrate to the external surface.  No active bleeding.  Visualization of the valve revealed no other evidence of intraoral trauma. Neck: No stridor. No cervical spine tenderness to palpation.  Cardiovascular:  Good peripheral perfusion Respiratory: Normal respiratory effort without tachypnea or retractions. Lungs CTAB.  Musculoskeletal: Full range of motion to all extremities.  Visualization of the right upper extremity reveals edema, ecchymosis and mild abrasions to the right hand.  No frank lacerations.  Moving all digits at this time.  Edema is along the dorsal aspect of the hand over the metacarpals.  Examination of the left hand reveals scattered abrasions to the  pinky and thumb with lacerations of the middle and ring finger.  Both lacerations max are approximately 2 cm in length.  Edges are gaped open.  Slight ongoing bleeding is still appreciated but no pulsatile bleeding.  Does not involve the nail.  No retained foreign body identified.  Range of motion is not performed at this time while awaiting results of imaging.  Sensation intact to all digits.  Examination of the right knee reveals no obvious signs of trauma.  Tender over the anterior aspect of the knee without palpable abnormality.  Examination of the hip and ankle is unremarkable. Neurologic:  No [place] focal neurologic deficits are appreciated.  Skin:   No rash noted Other:   ED Results / Procedures / Treatments   Labs (all labs ordered are listed, but only abnormal results are displayed) Labs Reviewed - No data to display   EKG     RADIOLOGY  I personally viewed and  evaluated these images as part of my medical decision making, as well as reviewing the written report by the radiologist.  ED Provider Interpretation: Patient with distal tuft fractures of the third and fourth digits to the left hand.  Right hand is reassuring as well as her right knee x-ray.  Patient has findings on CT maxillofacial consistent with alveolar ridge maxillary fracture.  This is not open on physical exam.  No other acute findings on CT head or cervical spine.  CT HEAD WO CONTRAST (5MM)  Result Date: 02/27/2021 CLINICAL DATA:  Trauma. EXAM: CT HEAD WITHOUT CONTRAST CT MAXILLOFACIAL WITHOUT CONTRAST CT CERVICAL SPINE WITHOUT CONTRAST TECHNIQUE: Multidetector CT imaging of the head, cervical spine, and maxillofacial structures were performed using the standard protocol without intravenous contrast. Multiplanar CT image reconstructions of the cervical spine and maxillofacial structures were also generated. COMPARISON:  None. FINDINGS: CT HEAD FINDINGS Brain: The ventricles and sulci are appropriate size for the patient's age. The gray-white matter discrimination is preserved. There is no acute intracranial hemorrhage. No mass effect or midline shift. No extra-axial fluid collection. Vascular: No hyperdense vessel or unexpected calcification. Skull: Normal. Negative for fracture or focal lesion. Other: None. CT MAXILLOFACIAL FINDINGS Osseous: There is fracture of the anterior alveolar process involving the maxillary central incisor teeth. Clinical correlation is recommended. No other acute fracture. No mandibular dislocation. Orbits: The globes and retro-orbital fat are preserved. Sinuses: Clear. Soft tissues: None CT CERVICAL SPINE FINDINGS Alignment: No acute subluxation. Skull base and vertebrae: No acute fracture.  Osteopenia. Soft tissues and spinal canal: No prevertebral fluid or swelling. No visible canal hematoma. Disc levels:  Degenerative changes. Upper chest: Negative. Other: None  IMPRESSION: 1. No acute intracranial pathology. 2. No acute/traumatic cervical spine pathology. 3. Fracture of the anterior alveolar process involving the maxillary central incisor teeth. Electronically Signed   By: Anner Crete M.D.   On: 02/27/2021 20:48   CT Cervical Spine Wo Contrast  Result Date: 02/27/2021 CLINICAL DATA:  Trauma. EXAM: CT HEAD WITHOUT CONTRAST CT MAXILLOFACIAL WITHOUT CONTRAST CT CERVICAL SPINE WITHOUT CONTRAST TECHNIQUE: Multidetector CT imaging of the head, cervical spine, and maxillofacial structures were performed using the standard protocol without intravenous contrast. Multiplanar CT image reconstructions of the cervical spine and maxillofacial structures were also generated. COMPARISON:  None. FINDINGS: CT HEAD FINDINGS Brain: The ventricles and sulci are appropriate size for the patient's age. The gray-white matter discrimination is preserved. There is no acute intracranial hemorrhage. No mass effect or midline shift. No extra-axial fluid collection. Vascular: No hyperdense vessel or  unexpected calcification. Skull: Normal. Negative for fracture or focal lesion. Other: None. CT MAXILLOFACIAL FINDINGS Osseous: There is fracture of the anterior alveolar process involving the maxillary central incisor teeth. Clinical correlation is recommended. No other acute fracture. No mandibular dislocation. Orbits: The globes and retro-orbital fat are preserved. Sinuses: Clear. Soft tissues: None CT CERVICAL SPINE FINDINGS Alignment: No acute subluxation. Skull base and vertebrae: No acute fracture.  Osteopenia. Soft tissues and spinal canal: No prevertebral fluid or swelling. No visible canal hematoma. Disc levels:  Degenerative changes. Upper chest: Negative. Other: None IMPRESSION: 1. No acute intracranial pathology. 2. No acute/traumatic cervical spine pathology. 3. Fracture of the anterior alveolar process involving the maxillary central incisor teeth. Electronically Signed   By: Anner Crete M.D.   On: 02/27/2021 20:48   DG Knee Complete 4 Views Right  Result Date: 02/27/2021 CLINICAL DATA:  Trauma, fall EXAM: RIGHT KNEE - COMPLETE 4+ VIEW COMPARISON:  None. FINDINGS: No recent fracture or dislocation is seen. Small smooth marginated calcifications along the anterior and posterior aspect of the knee may be residual from previous injury and possibly loose bodies. There is no significant effusion in the suprapatellar bursa. Degenerative changes are noted, more severe in the medial and patellofemoral compartments. IMPRESSION: No recent fracture or dislocation is seen. Severe degenerative changes are noted especially in the medial and patellofemoral compartments. Electronically Signed   By: Elmer Picker M.D.   On: 02/27/2021 16:52   DG Hand Complete Left  Result Date: 02/27/2021 CLINICAL DATA:  Trauma, fall EXAM: LEFT HAND - COMPLETE 3+ VIEW COMPARISON:  None. FINDINGS: Overlying dressing limits evaluation of distal phalanges of the middle and ring fingers. There is cortical irregularity in the tips of the distal phalanges of middle and ring fingers suggesting undisplaced fractures. Degenerative changes are noted with bony spurs in the some of the interphalangeal joints and first carpometacarpal joint. IMPRESSION: There is cortical irregularity in the tips of distal phalanges of left middle and ring fingers suggesting undisplaced fractures. Overlying artifacts in the dressing over the distal portion of middle and ring fingers limit evaluation of underlying bony structures. Degenerative changes in multiple joints as described in the body of the report. Electronically Signed   By: Elmer Picker M.D.   On: 02/27/2021 16:56   DG Hand Complete Right  Result Date: 02/27/2021 CLINICAL DATA:  Trauma, fall EXAM: RIGHT HAND - COMPLETE 3+ VIEW COMPARISON:  None. FINDINGS: No recent fracture or dislocation is seen. Degenerative changes are noted in multiple interphalangeal joints,  more so in the distal interphalangeal joint of the index finger. Bony spurs are noted in first carpometacarpal and first metacarpophalangeal joints. IMPRESSION: No recent fracture is seen. Degenerative changes are noted in multiple joints as described in the body of the report. Electronically Signed   By: Elmer Picker M.D.   On: 02/27/2021 16:54   CT Maxillofacial Wo Contrast  Result Date: 02/27/2021 CLINICAL DATA:  Trauma. EXAM: CT HEAD WITHOUT CONTRAST CT MAXILLOFACIAL WITHOUT CONTRAST CT CERVICAL SPINE WITHOUT CONTRAST TECHNIQUE: Multidetector CT imaging of the head, cervical spine, and maxillofacial structures were performed using the standard protocol without intravenous contrast. Multiplanar CT image reconstructions of the cervical spine and maxillofacial structures were also generated. COMPARISON:  None. FINDINGS: CT HEAD FINDINGS Brain: The ventricles and sulci are appropriate size for the patient's age. The gray-white matter discrimination is preserved. There is no acute intracranial hemorrhage. No mass effect or midline shift. No extra-axial fluid collection. Vascular: No hyperdense vessel or unexpected  calcification. Skull: Normal. Negative for fracture or focal lesion. Other: None. CT MAXILLOFACIAL FINDINGS Osseous: There is fracture of the anterior alveolar process involving the maxillary central incisor teeth. Clinical correlation is recommended. No other acute fracture. No mandibular dislocation. Orbits: The globes and retro-orbital fat are preserved. Sinuses: Clear. Soft tissues: None CT CERVICAL SPINE FINDINGS Alignment: No acute subluxation. Skull base and vertebrae: No acute fracture.  Osteopenia. Soft tissues and spinal canal: No prevertebral fluid or swelling. No visible canal hematoma. Disc levels:  Degenerative changes. Upper chest: Negative. Other: None IMPRESSION: 1. No acute intracranial pathology. 2. No acute/traumatic cervical spine pathology. 3. Fracture of the anterior  alveolar process involving the maxillary central incisor teeth. Electronically Signed   By: Anner Crete M.D.   On: 02/27/2021 20:48    PROCEDURES:  Critical Care performed: No  ..Laceration Repair  Date/Time: 02/27/2021 9:39 PM Performed by: Darletta Moll, PA-C Authorized by: Darletta Moll, PA-C   Consent:    Consent obtained:  Verbal   Consent given by:  Patient and guardian   Risks, benefits, and alternatives were discussed: yes     Risks discussed:  Infection, need for additional repair and poor wound healing Universal protocol:    Procedure explained and questions answered to patient or proxy's satisfaction: yes     Immediately prior to procedure, a time out was called: yes     Patient identity confirmed:  Verbally with patient Anesthesia:    Anesthesia method:  Nerve block   Block location:  Middle finger L hand   Block needle gauge:  27 G   Block anesthetic:  Lidocaine 1% w/o epi   Block technique:  Digital block   Block injection procedure:  Anatomic landmarks identified, introduced needle, negative aspiration for blood and incremental injection   Block outcome:  Anesthesia achieved Laceration details:    Location:  Finger   Finger location:  L long finger   Length (cm):  2 Pre-procedure details:    Preparation:  Patient was prepped and draped in usual sterile fashion and imaging obtained to evaluate for foreign bodies Exploration:    Hemostasis achieved with:  Direct pressure   Imaging obtained: x-ray     Imaging outcome: foreign body not noted     Wound exploration: wound explored through full range of motion and entire depth of wound visualized     Wound extent: underlying fracture     Wound extent: no foreign bodies/material noted, no muscle damage noted, no nerve damage noted, no tendon damage noted and no vascular damage noted     Contaminated: no   Treatment:    Area cleansed with:  Povidone-iodine and saline   Amount of cleaning:   Extensive   Irrigation volume:  500   Irrigation method:  Syringe   Debridement:  None Skin repair:    Repair method:  Sutures   Suture size:  4-0   Suture material:  Nylon   Suture technique:  Simple interrupted   Number of sutures:  5 Approximation:    Approximation:  Close Repair type:    Repair type:  Intermediate Post-procedure details:    Dressing:  Splint for protection   Procedure completion:  Tolerated well, no immediate complications .Marland KitchenLaceration Repair  Date/Time: 02/27/2021 9:41 PM Performed by: Darletta Moll, PA-C Authorized by: Darletta Moll, PA-C   Consent:    Consent obtained:  Verbal   Consent given by:  Patient and guardian   Risks discussed:  Infection, nerve  damage, need for additional repair, pain and poor wound healing Universal protocol:    Procedure explained and questions answered to patient or proxy's satisfaction: yes     Immediately prior to procedure, a time out was called: yes     Patient identity confirmed:  Verbally with patient Anesthesia:    Anesthesia method:  Nerve block   Block location:  Ring finger L hand   Block needle gauge:  27 G   Block anesthetic:  Lidocaine 1% w/o epi   Block technique:  Digital block   Block injection procedure:  Anatomic landmarks identified, introduced needle, negative aspiration for blood and incremental injection   Block outcome:  Anesthesia achieved Laceration details:    Location:  Finger   Finger location:  L ring finger   Length (cm):  2 Pre-procedure details:    Preparation:  Patient was prepped and draped in usual sterile fashion and imaging obtained to evaluate for foreign bodies Exploration:    Hemostasis achieved with:  Direct pressure   Imaging obtained: x-ray     Imaging outcome: foreign body not noted     Wound exploration: wound explored through full range of motion and entire depth of wound visualized     Wound extent: underlying fracture     Wound extent: no foreign  bodies/material noted, no nerve damage noted and no tendon damage noted     Contaminated: no   Treatment:    Area cleansed with:  Povidone-iodine and saline   Amount of cleaning:  Extensive   Irrigation solution:  Sterile saline   Irrigation volume:  500 ml   Irrigation method:  Syringe Skin repair:    Repair method:  Sutures   Suture size:  4-0   Suture material:  Nylon   Suture technique:  Simple interrupted   Number of sutures:  6 Approximation:    Approximation:  Close Repair type:    Repair type:  Intermediate Post-procedure details:    Dressing:  Splint for protection   Procedure completion:  Tolerated well, no immediate complications   MEDICATIONS ORDERED IN ED: Medications  Tdap (BOOSTRIX) injection 0.5 mL (0.5 mLs Intramuscular Given 02/27/21 1705)  lidocaine (PF) (XYLOCAINE) 1 % injection 15 mL (15 mLs Infiltration Given 02/27/21 1707)     IMPRESSION / MDM / ASSESSMENT AND PLAN / ED COURSE  I reviewed the triage vital signs and the nursing notes.                              Differential diagnosis includes, but is not limited to, fractures of the bilateral hands, knee fracture, intracranial hemorrhage, skull fracture, facial fracture   Patient's diagnosis is consistent with fall resulting in distal tuft fractures that are open to the third and fourth digits of the left hand.  Contusion to the right hand, contusion to the knee, alveolar ridge fracture of the maxilla.  Patient presented to the urgency department after a mechanical fall.  She had open wounds to the third and fourth digit of the left hand which had underlying mild distal tuft fractures.  Patient had wound sutured, wound care instructions discussed with the patient's sister who is the primary caregiver and guardian for the patient.  Patient will be placed on antibiotics for same.  She also has a fracture to the maxilla.  Teeth are still intact, there is no open wounds along the gumline.  Patient will already  be on antibiotics for  open tuft fractures of the fingers.  Patient will see her dentist to have a partial bridge placed to secure dentition.  Until then soft diet.  Return precautions discussed at length with the patient's sister.  There is no indication for further imaging or labs at this time.  Given the trauma, injuries initially there was some concern that patient may require admission, however exam and remainder of work-up is reassuring and patient is stable for discharge.  Follow-up with primary care in 10 days for suture removal.  Follow-up with dentist as soon as possible for maxillary injury.  Again return precautions discussed with the patient and sister..  Patient is given ED precautions to return to the ED for any worsening or new symptoms.    Clinical Course as of 02/27/21 2143  Wed Feb 27, 2021  1654 DG Knee Complete 4 Views Right [AE]    Clinical Course User Index [AE] Prince Solian, Student-PA     FINAL CLINICAL IMPRESSION(S) / ED DIAGNOSES   Final diagnoses:  Fall, initial encounter  Laceration of left middle finger without foreign body without damage to nail, initial encounter  Laceration of left ring finger without foreign body without damage to nail, initial encounter  Open fracture of tuft of distal phalanx of finger  Closed fracture of alveolar process of maxilla, initial encounter ([place])     Rx / DC Orders   ED Discharge Orders          Ordered    cephALEXin (KEFLEX) 500 MG capsule  2 times daily        02/27/21 2134    sulfamethoxazole-trimethoprim (BACTRIM DS) 800-160 MG tablet  2 times daily        02/27/21 2134    HYDROcodone-acetaminophen (NORCO/VICODIN) 5-325 MG tablet  Every 4 hours PRN        02/27/21 2134             Note:  This document was prepared using Dragon voice recognition software and may include unintentional dictation errors.   Brynda Peon 02/27/21 2143    Blake Divine, MD 02/27/21 [phone]

## 2021-02-27 NOTE — ED Notes (Signed)
Family signed esignature  d/c inst to pt.

## 2021-02-27 NOTE — ED Triage Notes (Signed)
Pt to ED with sister for trip and fall while taking in trash cans/ pt with swelling and cut to top lip. Significant deformity/lacerations to left middle finger and left ring finger. Fingers wrapped to hold together and stop bleeding. Erlene Quan PA to triage C/o right knee pain

## 2021-02-28 ENCOUNTER — Telehealth: Payer: Self-pay | Admitting: Internal Medicine

## 2021-02-28 NOTE — Telephone Encounter (Signed)
Pt sister called in stating pt fail yesterday and went to the hospital. Pt has a fractured bone in her face above her two front teeth. The tips of her left two middle fingers about an inch under the finger nail had to be stitched at the tip of the cut. Pt need stitches taken out by 1/20. Pt stated the doctor did not want them to stay in no longer than that because of where its located.

## 2021-02-28 NOTE — Telephone Encounter (Signed)
Pt scheduled for suture removal. Confirmed patient doing ok.

## 2021-03-05 NOTE — Patient Instructions (Signed)
Below is our plan:  We will continue current treatment plan. I will check some labs today and call you with results. I will ask Dr Brett Fairy about possibility of repeating sleep study due to continued snoring concerns of apneic events.   Please make sure you are staying well hydrated. I recommend 50-60 ounces daily. Well balanced diet and regular exercise encouraged. Consistent sleep schedule with 6-8 hours recommended.   Please continue follow up with care team as directed.   Follow up with Dr Brett Fairy in 6 months, anticipate alternating care with MD.   Madison Burns may receive a survey regarding today's visit. I encourage you to leave honest feed back as I do use this information to improve patient care. Thank you for seeing me today!

## 2021-03-05 NOTE — Progress Notes (Signed)
PATIENT: Madison Burns DOB: [birth year]  REASON FOR VISIT: follow up HISTORY FROM: patient  Chief Complaint  Patient presents with   Obstructive Sleep Apnea    Rm 1, alone. Here for 6 month f/u. Pt continues to snore. Pt reports falling last Wednesday, fracturing 2 fingers in her L hand and broke her front jaw. Having a hard time to eating.      HISTORY OF PRESENT ILLNESS: 03/06/21 ALL: Madison Burns returns for follow up for MG and OSA. She was last seen by Dr Madison Burns and continuing to have more difficulty with progressing MG. She continued mestinon and restarted MTX 10mg  weekly with folic acid. Since, she is doing well. She seems to be breathing well. She is on Tessalon Pearls for a dry cough. No significant changes in muscle weakness. She has good days and bad days. She did have trouble getting left leg in the [place], today.   No seizures. Only event in 2012. Not on AED.   She had a fall 02/27/2021 while pushing a trash can. She landed on her right knee and hands, she did hit her face. She was diagnosed with left 3rd an 4th digit tuft fractures and maxillary ridge fracture. Hand lac sutured and abx given. She was advised to follow up close with dentistry for partial bridge. She will see PCP for suture removal. Gait is usually stable. She does not use an assistive device.   Last HST showed very mild OSA. CPAP was discontinued. Her sister reports that she continues to snore and has apneic events. She wakes herself up snoring. She is more sleepy during the day. Sister feels that last HST was not a "good study" and concerned that Madison Burns still has sleep apnea.   02/22/2020 ALL: Madison Burns returns today for follow up of MG. She was last seen by Dr Madison Burns in 10/2019 in the setting of MG exacerbation d/t caner treatments. Mestinon was continued TID. Mtx 10mg  weekly resumed and prednisone taper given. Her sister, Madison Burns, reports that she was nearly back to baseline over night. She is doing very well today.  She is tolerating medications well. She continue to follow up closely with oncology and plans to have office visit this month.  She continues CPAP nightly and is doing well. No concerns today.   History (copied from previous notes)  11/15/2019 CD: Madison Burns is a 71 y.o. female with mental retardation, living with her sister. Madison Burns has now been diagnosed with renal cell cancer and her Myasthenia has taken a turn for the worse. She is presenting with ptosis , neck weakness and left shoulder slump. She is no longer able to walk unassisted, presents stooped. Her voice is hoarse.  She was last seen by Madison Burns , NP on 09-06-2019.  She also has developed wheezing in the meantime but she last saw Madison Burns she presented much less impaired.  In the meantime which she had called our office.  She has completed her chemoradiation therapy and she is certainly has a weaker immune system she also developed pneumonia and sepsis following her cancer treatment and surgery.  She was seen at the [facility] Burns's emergency room just 10 days ago on 9-17 21 with breathing difficulties shortness of breath thought to be due to left-sided pneumonia and possible myasthenia gravis exacerbation.  She underwent treatment with antibiotics and IVIG and ultimately was discharged after week.  She gets short of breath now with exertion still, sometimes she is wheezing. She is sleeping  in a Burns bed, and she cannot breath  easily- has orthopnea. This bed is adjustable and helps her shortness of breath.    Since the patient was discharged from the Burns she has resumed compliant use of her CPAP and would now be 100% for the time since discharge.  Average use at time 9 hours 2 minutes, she is above average sleepy the CPAP is set to 5 cmH2O with 3 cm EPR and her AHI is 2.2 which is an excellent resolution of this otherwise mild apnea which was more classified as a high apnea.  I am worried that she is so weak at this  time that she may not participate well in physical therapy or gait stabilization but I will wait if she has regained some of her strength and respiratory capacity in the next 3 months so that we could do some physical therapy as well.   09/06/2019 ALL: Madison Burns is a 71 y.o. female here today for follow up for MG. She continues mestinon TID. Methotrexate discontinued when she started chemo. She feels that symptoms are stable. Her sister has noted once or twice where her left eye will seem to be "droopy" at night when she is tired. She did have several episodes where she got strangled during chemo therapy. She sometimes will take larger bites of food and will eat fast. Her sister is helping to monitor this. Renal cell- tumor was "frozen" with urology. Thymoma- had chemo and radiation. Has a PET scan tomorrow. She seems more interactive since. She is able to answer more complicated questions. No falls. No vision changes/double vision. She has had an occasional headache, aborted with tylenol. She has had mild tingling of her feet following chemo but does not feel it is consistent or worsening. Sleep apnea well managed on CPAP. Has not had a seizure in 9 years, not on AED.   HISTORY: (copied from Dr Edwena Felty note on 03/09/2019)  Madison Burns is a 71 y.o. female with mental retardation, living with her sister. Madison Burns has been on medication for myasthenia gravis, she had lost some weight since eating mostly at home during the pandemic.  She still has a good appetite no trouble swallowing she had no falls and no changes in gait.  No taking any seizure medicine, she has continued Mestinon and methotrexate for myasthenia gravis and it was felt that she was fairly stable.   Then she learned that she was diagnosed with a mediastinal tumor that is close to the upper aortic arch,  In addition to the recent discovery of , what may be a thymoma,  she had a history of melanoma which was removed in 2014 from the  right side of the neck close to the TM joint, and she now has a spot on her kidney presumed to be malignant. This would be her third tumor.    IMPRESSION: 1. Apparent subtle 3.2 cm homogeneously hypoenhancing lesion in the posterior interpolar left kidney. Imaging features raise concern for papillary type renal cell carcinoma. Abdominal MRI with and without contrast recommended to further evaluate. 2. Bilateral adrenal nodules. These could also be further assessed at the time of follow-up MRI. 3. No specific findings to explain the patient's history of periumbilical pain. 4. Tiny gas bubble identified in the urinary bladder, presumably from recent instrumentation. In the absence of instrumentation, infection would be a consideration. 5. Insert [name] 6. Left colonic diverticulosis without diverticulitis.   She has been followed here by me  for OSA- for obstructive sleep apnea and has been a very compliant CPAP user V visit for sleep apnea is not to do until September 2021 with Madison Burns.      Madison Burns, 11-03-2018 here today for follow up of MG, seizure, and OSA on CPAP. She is here today with her sister , Madison Burns who aids in history. She continues mestinon and methotrexate for MG and feels symptoms are fairly stable. No vision changes, no double vision. No new or worsening weakness. She has lost some weight as they are eating at home more since pandemic. She has a good appetite. No trouble swallowing. No changes in gait or falls. She is able to perform ADL's. She lives with her sister. She has not taken seizure medication in many years with no seizure activity noted. No adverse effects or worsening symptoms with decreased dose of methotrexate.    Compliance report dated 10/03/2018 through 11/01/2018 reveals that she is using CPAP nightly for compliance 100%.  She is using CPAP greater than 4 hours every night.  Average usage is 9 hours and 57 minutes.  AHI is 1.9 on 5 cm of water.  There is no  significant leak.    REVIEW OF SYSTEMS: Out of a complete 14 system review of symptoms, the patient complains only of the following symptoms, headaches, tingling of feet, cough, weakness, snoring, daytime sleepiness and all other reviewed systems are negative.  ESS: 3  ALLERGIES: No Known Allergies  HOME MEDICATIONS: Outpatient Medications Prior to Visit  Medication Sig Dispense Refill   acetaminophen (TYLENOL) 325 MG tablet Take 2 tablets (650 mg total) by mouth every 6 (six) hours as needed for mild pain (or Fever >/= 101).     amiodarone (PACERONE) 200 MG tablet Take 1 tablet (200 mg total) by mouth daily. 14 tablet 0   Ascorbic Acid (VITAMIN C) 1000 MG tablet Take 1,000 mg by mouth at bedtime.     B Complex-C (B-COMPLEX WITH VITAMIN C) tablet Take 1 tablet by mouth every evening.     benzonatate (TESSALON) 100 MG capsule TAKE 1 CAPSULE BY MOUTH TWICE DAILY AS NEEDED COUGH 90 capsule 0   Biotin 10000 MCG TABS Take 10,000 mcg by mouth daily at 2 PM. (1430)     Calcium-Phosphorus-Vitamin D (CITRACAL +D3 PO) Take 1 tablet by mouth daily.     cephALEXin (KEFLEX) 500 MG capsule Take 2 capsules (1,000 mg total) by mouth 2 (two) times daily. 28 capsule 0   diltiazem (TIAZAC) 180 MG 24 hr capsule Take by mouth.     DULoxetine (CYMBALTA) 30 MG capsule TAKE 1 CAPSULE BY MOUTH ONCE DAILY 30 capsule 2   folic acid (FOLVITE) 1 MG tablet TAKE 2 TABLETS BY MOUTH ONCE DAILY 60 tablet 2   Garlic 3149 MG CAPS Take 1,000 mg by mouth in the morning, at noon, and at bedtime. (0800, 1430, 2000)     HYDROcodone-acetaminophen (NORCO/VICODIN) 5-325 MG tablet Take 1 tablet by mouth every 4 (four) hours as needed for moderate pain. 12 tablet 0   levothyroxine (SYNTHROID) 75 MCG tablet TAKE 1 TABLET BY MOUTH ONCE DAILY ON AN EMPTY STOMACH. WAIT 30 MINUTES BEFORE TAKING OTHER MEDS. 90 tablet 1   methotrexate 2.5 MG tablet TAKE 4 TABLETS BY MOUTH BY MOUTH ONCE A WEEK WITH FOLIC ACID 16 tablet 5   pantoprazole  (PROTONIX) 40 MG tablet Take 40 mg by mouth daily.      Probiotic Product (PROBIOTIC MULTI-ENZYME PO) Take 1 capsule by  mouth in the morning and at bedtime. Probiotic Multi-Enzyme Digestive Formula     pyridostigmine (MESTINON) 60 MG tablet Take 1 tablet (60 mg total) by mouth 3 (three) times daily. 90 tablet 1   risperiDONE (RISPERDAL) 1 MG tablet Take 1 mg by mouth at bedtime.      sulfamethoxazole-trimethoprim (BACTRIM DS) 800-160 MG tablet Take 1 tablet by mouth 2 (two) times daily. 14 tablet 0   tolterodine (DETROL LA) 4 MG 24 hr capsule TAKE 1 CAPSULE BY MOUTH ONCE EVERY MORNING 90 capsule 1   vitamin E 180 MG (400 UNITS) capsule Take 400 Units by mouth daily at 2 PM. 1430     No facility-administered medications prior to visit.    PAST MEDICAL HISTORY: Past Medical History:  Diagnosis Date   Cancer of kidney ([place]) 1610   Complication of anesthesia    Myasthenia gravis   GERD (gastroesophageal reflux disease)    History of seizure disorder    Hypercholesterolemia    Hypertension    no longer has it. wt loss   Hypothyroidism    Irritable bowel syndrome    Melanoma ([place]) 2017   Melanoma on neck   Myasthenia gravis ([place]) 2011   OSA on CPAP    Thymoma, malignant ([place])     PAST SURGICAL HISTORY: Past Surgical History:  Procedure Laterality Date   ABDOMINAL HYSTERECTOMY  1990   APPENDECTOMY  1999   CHOLECYSTECTOMY  2013   COLONOSCOPY WITH PROPOFOL N/A 06/30/2017   Procedure: COLONOSCOPY WITH PROPOFOL;  Surgeon: Toledo, Benay Pike, MD;  Location: [facility];  Service: Gastroenterology;  Laterality: N/A;   CYSTOSCOPY W/ URETERAL STENT PLACEMENT Left 08/17/2019   Procedure: CYSTOSCOPY WITH RETROGRADE PYELOGRAM/URETERAL STENT PLACEMENT;  Surgeon: Alexis Frock, MD;  Location: [facility];  Service: Urology;  Laterality: Left;  30 MINS   ESOPHAGOGASTRODUODENOSCOPY (EGD) WITH PROPOFOL N/A 06/30/2017   Procedure: ESOPHAGOGASTRODUODENOSCOPY (EGD) WITH PROPOFOL;  Surgeon: Toledo,  Benay Pike, MD;  Location: [facility] END[facility]COPY;  Service: Gastroenterology;  Laterality: N/A;   IR RADIOLOGIST EVAL & MGMT  06/28/2019   IR RADIOLOGIST EVAL & MGMT  09/14/2019   IR RADIOLOGIST EVAL & MGMT  03/28/2020   IR RADIOLOGIST EVAL & MGMT  10/10/2020   RADIOFREQUENCY ABLATION Left 08/17/2019   Procedure: LEFT RENAL CRYO ABLATION;  Surgeon: Jacqulynn Cadet, MD;  Location: [facility];  Service: Anesthesiology;  Laterality: Left;   TONSILLECTOMY  1959    FAMILY HISTORY: Family History  Problem Relation Age of Onset   Colon cancer Maternal Grandfather        stomach/colon   Heart disease Father        myocardial infarction   Hyperlipidemia Father    Hyperlipidemia Sister    Diabetes Sister    Bone cancer Other        cousin   Alzheimer's disease Mother        maternal aunts   Skin cancer Mother    Myasthenia gravis Brother        ocular   Skin cancer Brother    Stroke Paternal Grandfather    Breast cancer Neg Hx     SOCIAL HISTORY: Social History   Socioeconomic History   Marital status: Single    Spouse name: Not on file   Number of children: 0   Years of education: Special Ed   Highest education level: Not on file  Occupational History   Occupation: Unemployed  Tobacco Use   Smoking status: Never   Smokeless tobacco: Never  Vaping Use   Vaping Use: Never used  Substance and Sexual Activity   Alcohol use: No    Alcohol/week: 0.0 standard drinks   Drug use: No   Sexual activity: Not Currently  Other Topics Concern   Not on file  Social History Narrative   06/23/17 lives with sister, Madison Burns   Regular exercise-no   Caffeine Use-yes   Social Determinants of Health   Financial Resource Strain: Not on file  Food Insecurity: Not on file  Transportation Burns: Not on file  Physical Activity: Not on file  Stress: Not on file  Social Connections: Not on file  Intimate Partner Violence: Not on file      PHYSICAL EXAM  Vitals:   03/06/21 1336  BP: 118/68  Pulse:  93  SpO2: 95%  Weight: 212 lb 8 oz (96.4 kg)  Height: 5\' 3"  (1.6 m)    Body mass index is 37.64 kg/m.  Generalized: Well developed, in no acute distress  Cardiology: normal rate and rhythm, no murmur noted Respiratory: clear to auscultation bilaterally  Neurological examination  Mentation: Alert, not fully oriented but able to assist with some history taking, developmentally delayed.  Cranial nerve II-XII: Pupils were equal round reactive to light. No ptosis noted. Extraocular movements were full, visual field were full on confrontational test. Facial sensation and strength were normal.  Head turning and shoulder shrug  were normal and symmetric. Motor: The motor testing reveals 5 over 5 strength of all 4 extremities. Good symmetric motor tone is noted throughout.  Gait and station: Gait is normal. Very mildly stooped posture, tandem not attempted.  DTR: symmetric bilaterally, brisk in bilateral upper extremities.    DIAGNOSTIC DATA (LABS, IMAGING, TESTING) - I reviewed patient records, labs, notes, testing and imaging myself where available.  No flowsheet data found.   Lab Results  Component Value Date   WBC 6.2 10/29/2020   HGB 14.1 10/29/2020   HCT 43.0 10/29/2020   MCV 97.7 10/29/2020   PLT 156 10/29/2020      Component Value Date/Time   NA 139 10/29/2020 1003   NA 144 04/04/2015 1600   NA 137 02/21/2012 1828   K 3.5 10/29/2020 1003   K 4.3 02/21/2012 1828   CL 100 10/29/2020 1003   CL 104 02/21/2012 1828   CO2 31 10/29/2020 1003   CO2 25 02/21/2012 1828   GLUCOSE 102 (H) 10/29/2020 1003   GLUCOSE 145 (H) 02/21/2012 1828   BUN 14 10/29/2020 1003   BUN 8 04/04/2015 1600   BUN 8 02/21/2012 1828   CREATININE 0.96 10/29/2020 1003   CREATININE 1.14 02/21/2012 1828   CALCIUM 8.6 (L) 10/29/2020 1003   CALCIUM 9.0 02/21/2012 1828   PROT 6.4 (L) 10/29/2020 1003   PROT 6.0 04/04/2015 1600   PROT 6.4 04/25/2011 0854   ALBUMIN 3.8 10/29/2020 1003   ALBUMIN 4.0  04/04/2015 1600   ALBUMIN 3.4 04/25/2011 0854   AST 25 10/29/2020 1003   AST 18 04/25/2011 0854   ALT 18 10/29/2020 1003   ALT 25 04/25/2011 0854   ALKPHOS 59 10/29/2020 1003   ALKPHOS 45 (L) 04/25/2011 0854   BILITOT 0.6 10/29/2020 1003   BILITOT 0.4 04/04/2015 1600   BILITOT 0.4 04/25/2011 0854   GFRNONAA >60 10/29/2020 1003   GFRNONAA 52 (L) 02/21/2012 1828   GFRAA >60 11/04/2019 1725   GFRAA >60 02/21/2012 1828   Lab Results  Component Value Date   CHOL 197 12/14/2018   HDL 36.80 (L)  12/14/2018   LDLCALC 110 (H) 08/06/2018   LDLDIRECT 122.0 12/14/2018   TRIG 210.0 (H) 12/14/2018   CHOLHDL 5 12/14/2018   Lab Results  Component Value Date   HGBA1C 5.4 12/14/2018   No results found for: VITAMINB12 Lab Results  Component Value Date   TSH 4.334 04/07/2020       ASSESSMENT AND PLAN 71 y.o. year old female  has a past medical history of Cancer of kidney ([place]) (3536), Complication of anesthesia, GERD (gastroesophageal reflux disease), History of seizure disorder, Hypercholesterolemia, Hypertension, Hypothyroidism, Irritable bowel syndrome, Melanoma ([place]) (2017), Myasthenia gravis ([place]) (2011), OSA on CPAP, and Thymoma, malignant ([place]). here with     ICD-10-CM   1. Myasthenia gravis ([place])  G70.00 CMP    CBC with Differential/Platelets    Folate    2. Seizure disorder ([place])  G40.909     3. OSA on CPAP  G47.33    Z99.89     4. Renal cell cancer, left ([place])  C64.2        [name] is doing well today. She continues mestinon 60mg  TID, methotrexate 10mg  weekly and folic acid 2mg  daily. She is tolerating medications well.  We will continue current treatment plan. I will update labs. No recent seizures. Last sleep study showed AHI 3.5/hr. No breakdown of REM sleep or sleep position noted. Based on results Dr Madison Burns recommended to discontinue CPAP. Her sister feels this was a poor study due to difficulty getting the leads to stick to her skin. She continues to snore and have  witnessed apnea. We will discuss next steps with Dr Madison Burns. Would like to obtain lab PSG, if possible due to complexity of her medical history. She was encouraged to continue healthy lifestyle habits. She will continue close follow up with her care team. Follow up with PCP for suture removal. Fall precautions advised. She will follow up with Dr Madison Burns in 6 months. Will plan to alternate follow up visits with NP and MD.    Orders Placed This Encounter  Procedures   CMP   CBC with Differential/Platelets   Folate     No orders of the defined types were placed in this encounter.      Madison Presto, FNP-C 03/06/2021, 2:35 PM [facility] [address], [place], [place] [address] [phone]

## 2021-03-06 ENCOUNTER — Encounter: Payer: Self-pay | Admitting: Family Medicine

## 2021-03-06 ENCOUNTER — Ambulatory Visit (INDEPENDENT_AMBULATORY_CARE_PROVIDER_SITE_OTHER): Payer: Medicare Other | Admitting: Family Medicine

## 2021-03-06 VITALS — BP 118/68 | HR 93 | Ht 63.0 in | Wt 212.5 lb

## 2021-03-06 DIAGNOSIS — G40909 Epilepsy, unspecified, not intractable, without status epilepticus: Secondary | ICD-10-CM

## 2021-03-06 DIAGNOSIS — Z9989 Dependence on other enabling machines and devices: Secondary | ICD-10-CM

## 2021-03-06 DIAGNOSIS — G7000 Myasthenia gravis without (acute) exacerbation: Secondary | ICD-10-CM

## 2021-03-06 DIAGNOSIS — C642 Malignant neoplasm of left kidney, except renal pelvis: Secondary | ICD-10-CM

## 2021-03-06 DIAGNOSIS — G4733 Obstructive sleep apnea (adult) (pediatric): Secondary | ICD-10-CM

## 2021-03-07 ENCOUNTER — Encounter: Payer: Self-pay | Admitting: Oncology

## 2021-03-07 LAB — CBC WITH DIFFERENTIAL/PLATELET
Basophils Absolute: 0 10*3/uL (ref 0.0–0.2)
Basos: 0 %
EOS (ABSOLUTE): 0 10*3/uL (ref 0.0–0.4)
Eos: 0 %
Hematocrit: 40.4 % (ref 34.0–46.6)
Hemoglobin: 13.7 g/dL (ref 11.1–15.9)
Immature Grans (Abs): 0 10*3/uL (ref 0.0–0.1)
Immature Granulocytes: 0 %
Lymphocytes Absolute: 0.7 10*3/uL (ref 0.7–3.1)
Lymphs: 8 %
MCH: 31.9 pg (ref 26.6–33.0)
MCHC: 33.9 g/dL (ref 31.5–35.7)
MCV: 94 fL (ref 79–97)
Monocytes Absolute: 0.6 10*3/uL (ref 0.1–0.9)
Monocytes: 6 %
Neutrophils Absolute: 8.4 10*3/uL — ABNORMAL HIGH (ref 1.4–7.0)
Neutrophils: 86 %
Platelets: 199 10*3/uL (ref 150–450)
RBC: 4.3 x10E6/uL (ref 3.77–5.28)
RDW: 14.9 % (ref 11.7–15.4)
WBC: 9.9 10*3/uL (ref 3.4–10.8)

## 2021-03-07 LAB — COMPREHENSIVE METABOLIC PANEL
ALT: 33 IU/L — ABNORMAL HIGH (ref 0–32)
AST: 36 IU/L (ref 0–40)
Albumin/Globulin Ratio: 2.5 — ABNORMAL HIGH (ref 1.2–2.2)
Albumin: 4.5 g/dL (ref 3.8–4.8)
Alkaline Phosphatase: 74 IU/L (ref 44–121)
BUN/Creatinine Ratio: 14 (ref 12–28)
BUN: 16 mg/dL (ref 8–27)
Bilirubin Total: 0.3 mg/dL (ref 0.0–1.2)
CO2: 22 mmol/L (ref 20–29)
Calcium: 8.8 mg/dL (ref 8.7–10.3)
Chloride: 101 mmol/L (ref 96–106)
Creatinine, Ser: 1.15 mg/dL — ABNORMAL HIGH (ref 0.57–1.00)
Globulin, Total: 1.8 g/dL (ref 1.5–4.5)
Glucose: 82 mg/dL (ref 70–99)
Potassium: 4.8 mmol/L (ref 3.5–5.2)
Sodium: 135 mmol/L (ref 134–144)
Total Protein: 6.3 g/dL (ref 6.0–8.5)
eGFR: 52 mL/min/{1.73_m2} — ABNORMAL LOW (ref >59)

## 2021-03-07 LAB — FOLATE: Folate: >20 ng/mL (ref >3.0)

## 2021-03-08 ENCOUNTER — Ambulatory Visit (INDEPENDENT_AMBULATORY_CARE_PROVIDER_SITE_OTHER): Payer: Medicare Other | Admitting: Internal Medicine

## 2021-03-08 ENCOUNTER — Other Ambulatory Visit: Payer: Self-pay

## 2021-03-08 DIAGNOSIS — G7000 Myasthenia gravis without (acute) exacerbation: Secondary | ICD-10-CM

## 2021-03-08 DIAGNOSIS — I4891 Unspecified atrial fibrillation: Secondary | ICD-10-CM | POA: Diagnosis not present

## 2021-03-08 DIAGNOSIS — I10 Essential (primary) hypertension: Secondary | ICD-10-CM

## 2021-03-08 DIAGNOSIS — E039 Hypothyroidism, unspecified: Secondary | ICD-10-CM | POA: Diagnosis not present

## 2021-03-08 DIAGNOSIS — F39 Unspecified mood [affective] disorder: Secondary | ICD-10-CM

## 2021-03-08 DIAGNOSIS — D696 Thrombocytopenia, unspecified: Secondary | ICD-10-CM

## 2021-03-08 DIAGNOSIS — G40909 Epilepsy, unspecified, not intractable, without status epilepticus: Secondary | ICD-10-CM

## 2021-03-08 DIAGNOSIS — W19XXXD Unspecified fall, subsequent encounter: Secondary | ICD-10-CM

## 2021-03-08 DIAGNOSIS — C642 Malignant neoplasm of left kidney, except renal pelvis: Secondary | ICD-10-CM

## 2021-03-08 DIAGNOSIS — D4989 Neoplasm of unspecified behavior of other specified sites: Secondary | ICD-10-CM

## 2021-03-08 DIAGNOSIS — R739 Hyperglycemia, unspecified: Secondary | ICD-10-CM

## 2021-03-08 DIAGNOSIS — G4733 Obstructive sleep apnea (adult) (pediatric): Secondary | ICD-10-CM

## 2021-03-08 DIAGNOSIS — Z9989 Dependence on other enabling machines and devices: Secondary | ICD-10-CM

## 2021-03-08 NOTE — Progress Notes (Signed)
Patient ID: Madison Burns, female   DOB: May 29, 1950, 71 y.o.   MRN: [id number]   Subjective:    Patient ID: Madison Burns, female    DOB: 03/08/1950, 71 y.o.   MRN: [id number]  This visit occurred during the SARS-CoV-2 public health emergency.  Safety protocols were in place, including screening questions prior to the visit, additional usage of staff PPE, and extensive cleaning of exam room while observing appropriate contact time as indicated for disinfecting solutions.   Patient here for work in appt.   Chief Complaint  Patient presents with   Suture / Staple Removal   .   HPI Recently evaluated in ER 02/27/21 - fall.  She is accompanied by her sister.  History obtained from both - mostly her sister.  Was pushing a trash can - tripped and fell.  Hit her face.  No LOC.  "Busted" her lip.  Bilateral hand pain and right knee pain.  Found to have contusion to the right hand, contusion to the knee, alveolar ridge fracture of the maxilla.  teeth still intact. Patient presented to the emergency department after a mechanical fall.  She had open wounds to the third and fourth digit of the left hand which had underlying mild distal tuft fractures. Patient had wounds sutured - 5 left middle finger and 6 right ring finger.  Reports since discharge from ER, has been doing ok. No headache.  Eating soft foods.  No nausea or vomiting.  No abdominal pain.  Bandages in place - stuck - fingers.  No active bleeding.  No hip pain, knee pain or back pain.  No headache or dizziness.     Past Medical History:  Diagnosis Date   Cancer of kidney ([place]) 6712   Complication of anesthesia    Myasthenia gravis   GERD (gastroesophageal reflux disease)    History of seizure disorder    Hypercholesterolemia    Hypertension    no longer has it. wt loss   Hypothyroidism    Irritable bowel syndrome    Melanoma ([place]) 2017   Melanoma on neck   Myasthenia gravis ([place]) 2011   OSA on CPAP    Thymoma, malignant ([place])     Past Surgical History:  Procedure Laterality Date   [place]  2013   COLONOSCOPY WITH PROPOFOL N/A 06/30/2017   Procedure: COLONOSCOPY WITH PROPOFOL;  Surgeon: Toledo, Benay Pike, MD;  Location: [facility];  Service: Gastroenterology;  Laterality: N/A;   CYSTOSCOPY W/ URETERAL STENT PLACEMENT Left 08/17/2019   Procedure: CYSTOSCOPY WITH RETROGRADE PYELOGRAM/URETERAL STENT PLACEMENT;  Surgeon: Alexis Frock, MD;  Location: [facility];  Service: Urology;  Laterality: Left;  30 MINS   ESOPHAGOGASTRODUODENOSCOPY (EGD) WITH PROPOFOL N/A 06/30/2017   Procedure: ESOPHAGOGASTRODUODENOSCOPY (EGD) WITH PROPOFOL;  Surgeon: Toledo, Benay Pike, MD;  Location: [facility] END[facility]COPY;  Service: Gastroenterology;  Laterality: N/A;   IR RADIOLOGIST EVAL & MGMT  06/28/2019   IR RADIOLOGIST EVAL & MGMT  09/14/2019   IR RADIOLOGIST EVAL & MGMT  03/28/2020   IR RADIOLOGIST EVAL & MGMT  10/10/2020   RADIOFREQUENCY ABLATION Left 08/17/2019   Procedure: LEFT RENAL CRYO ABLATION;  Surgeon: Jacqulynn Cadet, MD;  Location: [facility];  Service: Anesthesiology;  Laterality: Left;   TONSILLECTOMY  1959   Family History  Problem Relation Age of Onset   Colon cancer Maternal Grandfather        stomach/colon   Heart disease Father  myocardial infarction   Hyperlipidemia Father    Hyperlipidemia Sister    Diabetes Sister    Bone cancer Other        cousin   Alzheimer's disease Mother        maternal aunts   Skin cancer Mother    Myasthenia gravis Brother        ocular   Skin cancer Brother    Stroke Paternal Grandfather    Breast cancer Neg Hx    Social History   Socioeconomic History   Marital status: Single    Spouse name: Not on file   Number of children: 0   Years of education: Special Ed   Highest education level: Not on file  Occupational History   Occupation: Unemployed  Tobacco Use   Smoking status: Never   Smokeless tobacco: Never  Vaping  Use   Vaping Use: Never used  Substance and Sexual Activity   Alcohol use: No    Alcohol/week: 0.0 standard drinks   Drug use: No   Sexual activity: Not Currently  Other Topics Concern   Not on file  Social History Narrative   06/23/17 lives with sister, [name]   Regular exercise-no   Caffeine Use-yes   Social Determinants of Health   Financial Resource Strain: Not on file  Food Insecurity: Not on file  Transportation Needs: Not on file  Physical Activity: Not on file  Stress: Not on file  Social Connections: Not on file     Review of Systems  Constitutional:  Negative for appetite change and unexpected weight change.  HENT:  Negative for congestion and sinus pressure.   Respiratory:  Negative for cough, chest tightness and shortness of breath.   Cardiovascular:  Negative for chest pain and palpitations.       No increased swelling.  Minimal bruising - knee.  No pain.   Gastrointestinal:  Negative for abdominal pain, diarrhea, nausea and vomiting.  Genitourinary:  Negative for difficulty urinating and dysuria.  Musculoskeletal:  Negative for joint swelling and myalgias.  Skin:  Negative for color change and rash.  Neurological:  Negative for dizziness, light-headedness and headaches.  Psychiatric/Behavioral:  Negative for agitation and dysphoric mood.       Objective:     BP 128/70    Pulse 97    Temp (!) 97 F (36.1 C)    Resp 16    Ht 5' 3"  (1.6 m)    Wt 207 lb (93.9 kg)    SpO2 98%    BMI 36.67 kg/m  Wt Readings from Last 3 Encounters:  03/08/21 207 lb (93.9 kg)  03/06/21 212 lb 8 oz (96.4 kg)  02/27/21 220 lb (99.8 kg)    Physical Exam Vitals reviewed.  Constitutional:      General: She is not in acute distress.    Appearance: Normal appearance.  HENT:     Head: Normocephalic and atraumatic.     Right Ear: External ear normal.     Left Ear: External ear normal.  Eyes:     General: No scleral icterus.       Right eye: No discharge.        Left eye: No  discharge.     Conjunctiva/sclera: Conjunctivae normal.  Neck:     Thyroid: No thyromegaly.  Cardiovascular:     Rate and Rhythm: Normal rate and regular rhythm.  Pulmonary:     Effort: No respiratory distress.     Breath sounds: Normal breath sounds. No  wheezing.  Abdominal:     General: Bowel sounds are normal.     Palpations: Abdomen is soft.     Tenderness: There is no abdominal tenderness.  Musculoskeletal:        General: No tenderness.     Cervical back: Neck supple. No tenderness.     Comments: Bandages - left third and fourth fingers.  No pain to palpation over knees, fingers, wrists.    Lymphadenopathy:     Cervical: No cervical adenopathy.  Skin:    Findings: No erythema or rash.  Neurological:     Mental Status: She is alert.  Psychiatric:        Mood and Affect: Mood normal.        Behavior: Behavior normal.     Outpatient Encounter Medications as of 03/08/2021  Medication Sig   acetaminophen (TYLENOL) 325 MG tablet Take 2 tablets (650 mg total) by mouth every 6 (six) hours as needed for mild pain (or Fever >/= 101).   amiodarone (PACERONE) 200 MG tablet Take 1 tablet (200 mg total) by mouth daily.   Ascorbic Acid (VITAMIN C) 1000 MG tablet Take 1,000 mg by mouth at bedtime.   B Complex-C (B-COMPLEX WITH VITAMIN C) tablet Take 1 tablet by mouth every evening.   benzonatate (TESSALON) 100 MG capsule TAKE 1 CAPSULE BY MOUTH TWICE DAILY AS NEEDED COUGH   Biotin 10000 MCG TABS Take 10,000 mcg by mouth daily at 2 PM. (1430)   Calcium-Phosphorus-Vitamin D (CITRACAL +D3 PO) Take 1 tablet by mouth daily.   diltiazem (TIAZAC) 180 MG 24 hr capsule Take by mouth.   DULoxetine (CYMBALTA) 30 MG capsule TAKE 1 CAPSULE BY MOUTH ONCE DAILY   Garlic 2440 MG CAPS Take 1,000 mg by mouth in the morning, at noon, and at bedtime. (0800, 1430, 2000)   HYDROcodone-acetaminophen (NORCO/VICODIN) 5-325 MG tablet Take 1 tablet by mouth every 4 (four) hours as needed for moderate pain.    levothyroxine (SYNTHROID) 75 MCG tablet TAKE 1 TABLET BY MOUTH ONCE DAILY ON AN EMPTY STOMACH. WAIT 30 MINUTES BEFORE TAKING OTHER MEDS.   methotrexate 2.5 MG tablet TAKE 4 TABLETS BY MOUTH BY MOUTH ONCE A WEEK WITH FOLIC ACID   pantoprazole (PROTONIX) 40 MG tablet Take 40 mg by mouth daily.    Probiotic Product (PROBIOTIC MULTI-ENZYME PO) Take 1 capsule by mouth in the morning and at bedtime. Probiotic Multi-Enzyme Digestive Formula   pyridostigmine (MESTINON) 60 MG tablet Take 1 tablet (60 mg total) by mouth 3 (three) times daily.   risperiDONE (RISPERDAL) 1 MG tablet Take 1 mg by mouth at bedtime.    tolterodine (DETROL Burns) 4 MG 24 hr capsule TAKE 1 CAPSULE BY MOUTH ONCE EVERY MORNING   vitamin E 180 MG (400 UNITS) capsule Take 400 Units by mouth daily at 2 PM. 1430   [DISCONTINUED] cephALEXin (KEFLEX) 500 MG capsule Take 2 capsules (1,000 mg total) by mouth 2 (two) times daily.   [DISCONTINUED] folic acid (FOLVITE) 1 MG tablet TAKE 2 TABLETS BY MOUTH ONCE DAILY   [DISCONTINUED] sulfamethoxazole-trimethoprim (BACTRIM DS) 800-160 MG tablet Take 1 tablet by mouth 2 (two) times daily.   No facility-administered encounter medications on file as of 03/08/2021.     Lab Results  Component Value Date   WBC 9.9 03/06/2021   HGB 13.7 03/06/2021   HCT 40.4 03/06/2021   PLT 199 03/06/2021   GLUCOSE 82 03/06/2021   CHOL 197 12/14/2018   TRIG 210.0 (H) 12/14/2018   HDL  36.80 (L) 12/14/2018   LDLDIRECT 122.0 12/14/2018   LDLCALC 110 (H) 08/06/2018   ALT 33 (H) 03/06/2021   AST 36 03/06/2021   NA 135 03/06/2021   K 4.8 03/06/2021   CL 101 03/06/2021   CREATININE 1.15 (H) 03/06/2021   BUN 16 03/06/2021   CO2 22 03/06/2021   TSH 4.334 04/07/2020   INR 1.0 10/23/2019   HGBA1C 5.4 12/14/2018    CT HEAD WO CONTRAST (5MM)  Result Date: 02/27/2021 CLINICAL DATA:  Trauma. EXAM: CT HEAD WITHOUT CONTRAST CT MAXILLOFACIAL WITHOUT CONTRAST CT CERVICAL SPINE WITHOUT CONTRAST TECHNIQUE: Multidetector  CT imaging of the head, cervical spine, and maxillofacial structures were performed using the standard protocol without intravenous contrast. Multiplanar CT image reconstructions of the cervical spine and maxillofacial structures were also generated. COMPARISON:  None. FINDINGS: CT HEAD FINDINGS Brain: The ventricles and sulci are appropriate size for the patient's age. The gray-white matter discrimination is preserved. There is no acute intracranial hemorrhage. No mass effect or midline shift. No extra-axial fluid collection. Vascular: No hyperdense vessel or unexpected calcification. Skull: Normal. Negative for fracture or focal lesion. Other: None. CT MAXILLOFACIAL FINDINGS Osseous: There is fracture of the anterior alveolar process involving the maxillary central incisor teeth. Clinical correlation is recommended. No other acute fracture. No mandibular dislocation. Orbits: The globes and retro-orbital fat are preserved. Sinuses: Clear. Soft tissues: None CT CERVICAL SPINE FINDINGS Alignment: No acute subluxation. Skull base and vertebrae: No acute fracture.  Osteopenia. Soft tissues and spinal canal: No prevertebral fluid or swelling. No visible canal hematoma. Disc levels:  Degenerative changes. Upper chest: Negative. Other: None IMPRESSION: 1. No acute intracranial pathology. 2. No acute/traumatic cervical spine pathology. 3. Fracture of the anterior alveolar process involving the maxillary central incisor teeth. Electronically Signed   By: Anner Crete M.D.   On: 02/27/2021 20:48   CT Cervical Spine Wo Contrast  Result Date: 02/27/2021 CLINICAL DATA:  Trauma. EXAM: CT HEAD WITHOUT CONTRAST CT MAXILLOFACIAL WITHOUT CONTRAST CT CERVICAL SPINE WITHOUT CONTRAST TECHNIQUE: Multidetector CT imaging of the head, cervical spine, and maxillofacial structures were performed using the standard protocol without intravenous contrast. Multiplanar CT image reconstructions of the cervical spine and maxillofacial  structures were also generated. COMPARISON:  None. FINDINGS: CT HEAD FINDINGS Brain: The ventricles and sulci are appropriate size for the patient's age. The gray-white matter discrimination is preserved. There is no acute intracranial hemorrhage. No mass effect or midline shift. No extra-axial fluid collection. Vascular: No hyperdense vessel or unexpected calcification. Skull: Normal. Negative for fracture or focal lesion. Other: None. CT MAXILLOFACIAL FINDINGS Osseous: There is fracture of the anterior alveolar process involving the maxillary central incisor teeth. Clinical correlation is recommended. No other acute fracture. No mandibular dislocation. Orbits: The globes and retro-orbital fat are preserved. Sinuses: Clear. Soft tissues: None CT CERVICAL SPINE FINDINGS Alignment: No acute subluxation. Skull base and vertebrae: No acute fracture.  Osteopenia. Soft tissues and spinal canal: No prevertebral fluid or swelling. No visible canal hematoma. Disc levels:  Degenerative changes. Upper chest: Negative. Other: None IMPRESSION: 1. No acute intracranial pathology. 2. No acute/traumatic cervical spine pathology. 3. Fracture of the anterior alveolar process involving the maxillary central incisor teeth. Electronically Signed   By: Anner Crete M.D.   On: 02/27/2021 20:48   DG Knee Complete 4 Views Right  Result Date: 02/27/2021 CLINICAL DATA:  Trauma, fall EXAM: RIGHT KNEE - COMPLETE 4+ VIEW COMPARISON:  None. FINDINGS: No recent fracture or dislocation is seen. Small smooth marginated  calcifications along the anterior and posterior aspect of the knee may be residual from previous injury and possibly loose bodies. There is no significant effusion in the suprapatellar bursa. Degenerative changes are noted, more severe in the medial and patellofemoral compartments. IMPRESSION: No recent fracture or dislocation is seen. Severe degenerative changes are noted especially in the medial and patellofemoral  compartments. Electronically Signed   By: Elmer Picker M.D.   On: 02/27/2021 16:52   DG Hand Complete Left  Result Date: 02/27/2021 CLINICAL DATA:  Trauma, fall EXAM: LEFT HAND - COMPLETE 3+ VIEW COMPARISON:  None. FINDINGS: Overlying dressing limits evaluation of distal phalanges of the middle and ring fingers. There is cortical irregularity in the tips of the distal phalanges of middle and ring fingers suggesting undisplaced fractures. Degenerative changes are noted with bony spurs in the some of the interphalangeal joints and first carpometacarpal joint. IMPRESSION: There is cortical irregularity in the tips of distal phalanges of left middle and ring fingers suggesting undisplaced fractures. Overlying artifacts in the dressing over the distal portion of middle and ring fingers limit evaluation of underlying bony structures. Degenerative changes in multiple joints as described in the body of the report. Electronically Signed   By: Elmer Picker M.D.   On: 02/27/2021 16:56   DG Hand Complete Right  Result Date: 02/27/2021 CLINICAL DATA:  Trauma, fall EXAM: RIGHT HAND - COMPLETE 3+ VIEW COMPARISON:  None. FINDINGS: No recent fracture or dislocation is seen. Degenerative changes are noted in multiple interphalangeal joints, more so in the distal interphalangeal joint of the index finger. Bony spurs are noted in first carpometacarpal and first metacarpophalangeal joints. IMPRESSION: No recent fracture is seen. Degenerative changes are noted in multiple joints as described in the body of the report. Electronically Signed   By: Elmer Picker M.D.   On: 02/27/2021 16:54   CT Maxillofacial Wo Contrast  Result Date: 02/27/2021 CLINICAL DATA:  Trauma. EXAM: CT HEAD WITHOUT CONTRAST CT MAXILLOFACIAL WITHOUT CONTRAST CT CERVICAL SPINE WITHOUT CONTRAST TECHNIQUE: Multidetector CT imaging of the head, cervical spine, and maxillofacial structures were performed using the standard protocol without  intravenous contrast. Multiplanar CT image reconstructions of the cervical spine and maxillofacial structures were also generated. COMPARISON:  None. FINDINGS: CT HEAD FINDINGS Brain: The ventricles and sulci are appropriate size for the patient's age. The gray-white matter discrimination is preserved. There is no acute intracranial hemorrhage. No mass effect or midline shift. No extra-axial fluid collection. Vascular: No hyperdense vessel or unexpected calcification. Skull: Normal. Negative for fracture or focal lesion. Other: None. CT MAXILLOFACIAL FINDINGS Osseous: There is fracture of the anterior alveolar process involving the maxillary central incisor teeth. Clinical correlation is recommended. No other acute fracture. No mandibular dislocation. Orbits: The globes and retro-orbital fat are preserved. Sinuses: Clear. Soft tissues: None CT CERVICAL SPINE FINDINGS Alignment: No acute subluxation. Skull base and vertebrae: No acute fracture.  Osteopenia. Soft tissues and spinal canal: No prevertebral fluid or swelling. No visible canal hematoma. Disc levels:  Degenerative changes. Upper chest: Negative. Other: None IMPRESSION: 1. No acute intracranial pathology. 2. No acute/traumatic cervical spine pathology. 3. Fracture of the anterior alveolar process involving the maxillary central incisor teeth. Electronically Signed   By: Anner Crete M.D.   On: 02/27/2021 20:48       Assessment & Plan:   Problem List Items Addressed This Visit     Atrial fibrillation with [facility])    Seeing cardiology.  On amiodarone and cardizem.  Appears to  be in SR.  Follow.       Fall    Recent fall as outlined.  Here for suture removal and f/u after fall.  No head pain. Planning to follow up with dentist regarding aveolar ridge fracture of maxilla.  No pain knees, hips, wrists.  Fingers soaked and irrigated extensively, finally able to remove bandages.  Wounds still oozing.  Discussed with ortho.  Given tuft fractures  and oozing from suture sites, will have them evaluate for suture removal and f/u.  Agreed to see her today.        Hyperglycemia    Low carb diet and exercise.  Follow met b and a1c.   Lab Results  Component Value Date   HGBA1C 5.4 12/14/2018       Hypertension    Blood pressure has been doing well. On oral cardizem.  Follow pressures and metabolic panel.       Hypothyroidism    On thyroid replacement.  Follow tsh.       Mood disorder ([place])    Followed by psychiatry.       Myasthenia gravis ([place])    Followed by neurology.  Stable.       OSA on CPAP    Sleeping better.  Follow.       Renal cell cancer, left [facility])    Saw urology (Dr Erlene Quan) - 04/19/20 - recommended f/u MRI in one year.       Seizure disorder ([place])    Followed by neurology.        Thrombocytopenia ([place])    Followed by oncology.       Thymoma    Followed by oncology.  S/p chemo and XRT.          Einar Pheasant, MD

## 2021-03-12 ENCOUNTER — Other Ambulatory Visit: Payer: Self-pay | Admitting: Neurology

## 2021-03-16 ENCOUNTER — Encounter: Payer: Self-pay | Admitting: Internal Medicine

## 2021-03-16 DIAGNOSIS — W19XXXA Unspecified fall, initial encounter: Secondary | ICD-10-CM | POA: Insufficient documentation

## 2021-03-16 NOTE — Assessment & Plan Note (Signed)
Saw urology (Dr Erlene Quan) - 04/19/20 - recommended f/u MRI in one year.

## 2021-03-16 NOTE — Assessment & Plan Note (Signed)
Sleeping better.  Follow.  

## 2021-03-16 NOTE — Assessment & Plan Note (Signed)
Blood pressure has been doing well. On oral cardizem.  Follow pressures and metabolic panel.  

## 2021-03-16 NOTE — Assessment & Plan Note (Signed)
On thyroid replacement.  Follow tsh.  

## 2021-03-16 NOTE — Assessment & Plan Note (Signed)
Seeing cardiology.  On amiodarone and cardizem.  Appears to be in SR.  Follow.  

## 2021-03-16 NOTE — Assessment & Plan Note (Signed)
Followed by oncology 

## 2021-03-16 NOTE — Assessment & Plan Note (Signed)
Followed by oncology.  S/p chemo and XRT.

## 2021-03-16 NOTE — Assessment & Plan Note (Signed)
Followed by neurology.   

## 2021-03-16 NOTE — Assessment & Plan Note (Signed)
Recent fall as outlined.  Here for suture removal and f/u after fall.  No head pain. Planning to follow up with dentist regarding aveolar ridge fracture of maxilla.  No pain knees, hips, wrists.  Fingers soaked and irrigated extensively, finally able to remove bandages.  Wounds still oozing.  Discussed with ortho.  Given tuft fractures and oozing from suture sites, will have them evaluate for suture removal and f/u.  Agreed to see her today.

## 2021-03-16 NOTE — Assessment & Plan Note (Signed)
Low carb diet and exercise.  Follow met b and a1c.   Lab Results  Component Value Date   HGBA1C 5.4 12/14/2018

## 2021-03-16 NOTE — Assessment & Plan Note (Signed)
Followed by psychiatry 

## 2021-03-16 NOTE — Assessment & Plan Note (Signed)
Followed by neurology.  Stable  

## 2021-03-20 ENCOUNTER — Other Ambulatory Visit: Payer: Self-pay | Admitting: Oncology

## 2021-03-21 ENCOUNTER — Ambulatory Visit
Admission: RE | Admit: 2021-03-21 | Discharge: 2021-03-21 | Disposition: A | Discharge status: Home / Self Care | Payer: Medicare Other | Source: Ambulatory Visit | Attending: Internal Medicine | Admitting: Internal Medicine

## 2021-03-21 ENCOUNTER — Other Ambulatory Visit: Payer: Self-pay

## 2021-03-21 DIAGNOSIS — Z1231 Encounter for screening mammogram for malignant neoplasm of breast: Secondary | ICD-10-CM | POA: Diagnosis not present

## 2021-03-22 ENCOUNTER — Encounter: Payer: Self-pay | Admitting: Internal Medicine

## 2021-03-22 DIAGNOSIS — Z0000 Encounter for general adult medical examination without abnormal findings: Secondary | ICD-10-CM | POA: Insufficient documentation

## 2021-03-23 ENCOUNTER — Encounter: Payer: Self-pay | Admitting: Oncology

## 2021-03-25 DIAGNOSIS — S62665A Nondisplaced fracture of distal phalanx of left ring finger, initial encounter for closed fracture: Secondary | ICD-10-CM | POA: Diagnosis not present

## 2021-03-28 ENCOUNTER — Other Ambulatory Visit: Payer: Self-pay | Admitting: Neurology

## 2021-03-28 DIAGNOSIS — G40309 Generalized idiopathic epilepsy and epileptic syndromes, not intractable, without status epilepticus: Secondary | ICD-10-CM

## 2021-03-28 DIAGNOSIS — F73 Profound intellectual disabilities: Secondary | ICD-10-CM

## 2021-03-28 DIAGNOSIS — G4733 Obstructive sleep apnea (adult) (pediatric): Secondary | ICD-10-CM

## 2021-04-01 DIAGNOSIS — Z86018 Personal history of other benign neoplasm: Secondary | ICD-10-CM | POA: Diagnosis not present

## 2021-04-01 DIAGNOSIS — L218 Other seborrheic dermatitis: Secondary | ICD-10-CM | POA: Diagnosis not present

## 2021-04-01 DIAGNOSIS — L578 Other skin changes due to chronic exposure to nonionizing radiation: Secondary | ICD-10-CM | POA: Diagnosis not present

## 2021-04-01 DIAGNOSIS — Z85820 Personal history of malignant melanoma of skin: Secondary | ICD-10-CM | POA: Diagnosis not present

## 2021-04-02 ENCOUNTER — Other Ambulatory Visit: Payer: Self-pay | Admitting: Adult Health

## 2021-04-12 ENCOUNTER — Other Ambulatory Visit: Payer: Self-pay | Admitting: Adult Health

## 2021-04-17 ENCOUNTER — Encounter: Payer: Self-pay | Admitting: Oncology

## 2021-04-22 ENCOUNTER — Other Ambulatory Visit: Payer: Self-pay | Admitting: *Deleted

## 2021-04-22 DIAGNOSIS — C642 Malignant neoplasm of left kidney, except renal pelvis: Secondary | ICD-10-CM

## 2021-04-23 NOTE — Progress Notes (Incomplete)
04/23/21 8:14 AM   Madison Burns 1950/03/23 [id number]  Referring provider:  Einar Pheasant, [address] [place],  [place] [address] No chief complaint on file.    HPI: Madison Burns is a 71 y.o.female with a personal history of  renal cell carcinoma, who presents today for a 1 year follow-up with MRI.   She was incidentaly found to have on CT abdomen pelvis with contrast on 03/09/2018 a possible 3 cm renal mass on the left, with significant artifact appreciated secondary to motion.   This was followed up with an MRCP on 02/20/2018 which visualized a 3.2 x2.6 left lower pole endophytic renal mass.   She is s/p CT biopsy on 03/28/2019 for this; surgical pathology was consistent with renal cell carcinoma.   She underwent successful cryoablation on 08/17/2018 one of the left renal mass. Postoperative course was complicated by some postoperative bleeding.  She did have a ureteral stent placed by Dr. Bess Harvest at the time due to proximity of the ureter which was removed by me.   MRI today ***  PMH: Past Medical History:  Diagnosis Date   Cancer of kidney ([name]) 7412   Complication of anesthesia    Myasthenia gravis   GERD (gastroesophageal reflux disease)    History of seizure disorder    Hypercholesterolemia    Hypertension    no longer has it. wt loss   Hypothyroidism    Irritable bowel syndrome    Melanoma ([name]) 2017   Melanoma on neck   Myasthenia gravis ([place]) 2011   OSA on CPAP    Thymoma, malignant ([name])     Surgical History: Past Surgical History:  Procedure Laterality Date   [place]  2013   COLONOSCOPY WITH PROPOFOL N/A 06/30/2017   Procedure: COLONOSCOPY WITH PROPOFOL;  Surgeon: Toledo, Benay Pike, MD;  Location: [facility];  Service: Gastroenterology;  Laterality: N/A;   CYSTOSCOPY W/ URETERAL STENT PLACEMENT Left 08/17/2019   Procedure: CYSTOSCOPY WITH RETROGRADE  PYELOGRAM/URETERAL STENT PLACEMENT;  Surgeon: Alexis Frock, MD;  [facility];  Service: Urology;  Laterality: Left;  30 MINS   ESOPHAGOGASTRODUODENOSCOPY (EGD) WITH PROPOFOL N/A 06/30/2017   Procedure: ESOPHAGOGASTRODUODENOSCOPY (EGD) WITH PROPOFOL;  Surgeon: Toledo, Benay Pike, MD;  Location: [facility];  Service: Gastroenterology;  Laterality: N/A;   IR RADIOLOGIST EVAL & MGMT  06/28/2019   IR RADIOLOGIST EVAL & MGMT  09/14/2019   IR RADIOLOGIST EVAL & MGMT  03/28/2020   IR RADIOLOGIST EVAL & MGMT  10/10/2020   RADIOFREQUENCY ABLATION Left 08/17/2019   Procedure: LEFT RENAL CRYO ABLATION;  Surgeon: Jacqulynn Cadet, MD;  [facility];  Service: Anesthesiology;  Laterality: Left;   TONSILLECTOMY  1959    Home Medications:  Allergies as of 04/24/2021   No Known Allergies      Medication List        Accurate as of April 23, 2021  8:14 AM. If you have any questions, ask your nurse or doctor.          acetaminophen 325 MG tablet Commonly known as: TYLENOL Take 2 tablets (650 mg total) by mouth every 6 (six) hours as needed for mild pain (or Fever >/= 101).   amiodarone 200 MG tablet Commonly known as: PACERONE Take 1 tablet (200 mg total) by mouth daily.   B-complex with vitamin C tablet Take 1 tablet by mouth every evening.   benzonatate 100 MG capsule Commonly  known as: TESSALON TAKE 1 CAPSULE BY MOUTH TWICE DAILY AS NEEDED COUGH   Biotin 10000 MCG Tabs Take 10,000 mcg by mouth daily at 2 PM. (1430)   CITRACAL +D3 PO Take 1 tablet by mouth daily.   diltiazem 180 MG 24 hr capsule Commonly known as: TIAZAC Take by mouth.   DULoxetine 30 MG capsule Commonly known as: CYMBALTA TAKE 1 CAPSULE BY MOUTH ONCE DAILY   folic acid 1 MG tablet Commonly known as: FOLVITE TAKE 2 TABLETS BY MOUTH ONCE DAILY   Garlic 2620 MG Caps Take 1,000 mg by mouth in the morning, at noon, and at bedtime. (0800, 1430, 2000)   HYDROcodone-acetaminophen 5-325 MG tablet Commonly  known as: NORCO/VICODIN Take 1 tablet by mouth every 4 (four) hours as needed for moderate pain.   levothyroxine 75 MCG tablet Commonly known as: SYNTHROID TAKE 1 TABLET BY MOUTH ONCE DAILY ON AN EMPTY STOMACH. WAIT 30 MINUTES BEFORE TAKING OTHER MEDS.   methotrexate 2.5 MG tablet TAKE 4 TABLETS BY MOUTH BY MOUTH ONCE A WEEK WITH FOLIC ACID   pantoprazole 40 MG tablet Commonly known as: PROTONIX Take 40 mg by mouth daily.   PROBIOTIC MULTI-ENZYME PO Take 1 capsule by mouth in the morning and at bedtime. Probiotic Multi-Enzyme Digestive Formula   pyridostigmine 60 MG tablet Commonly known as: MESTINON TAKE 1 TABLET BY MOUTH 3 TIMES DAILY   risperiDONE 1 MG tablet Commonly known as: RISPERDAL Take 1 mg by mouth at bedtime.   tolterodine 4 MG 24 hr capsule Commonly known as: DETROL Burns TAKE 1 CAPSULE BY MOUTH ONCE EVERY MORNING   vitamin C 1000 MG tablet Take 1,000 mg by mouth at bedtime.   vitamin E 180 MG (400 UNITS) capsule Take 400 Units by mouth daily at 2 PM. 1430        Allergies: No Known Allergies  Family History: Family History  Problem Relation Age of Onset   Colon cancer Maternal Grandfather        stomach/colon   Heart disease Father        myocardial infarction   Hyperlipidemia Father    Hyperlipidemia Sister    Diabetes Sister    Bone cancer Other        cousin   Alzheimer's disease Mother        maternal aunts   Skin cancer Mother    Myasthenia gravis Brother        ocular   Skin cancer Brother    Stroke Paternal Grandfather    Breast cancer Neg Hx     Social History:  reports that she has never smoked. She has never used smokeless tobacco. She reports that she does not drink alcohol and does not use drugs.   Physical Exam: There were no vitals taken for this visit.  Constitutional:  Alert and oriented, No acute distress. HEENT: [place] AT, moist mucus membranes.  Trachea midline, no masses. Cardiovascular: No clubbing, cyanosis, or  edema. Respiratory: Normal respiratory effort, no increased work of breathing. Skin: No rashes, bruises or suspicious lesions. Neurologic: Grossly intact, no focal deficits, moving all 4 extremities. Psychiatric: Normal mood and affect.  Laboratory Data:  Lab Results  Component Value Date   CREATININE 1.15 (H) 03/06/2021    Lab Results  Component Value Date   HGBA1C 5.4 12/14/2018    Urinalysis   Pertinent Imaging:    Assessment & Plan:   Renal cell carcinoma, left ([place]) - S/p successful cryoablation in 08/2019 -  No follow-ups on file.  I,Kailey Littlejohn,acting as a Education administrator for Hollice Espy, MD.,have documented all relevant documentation on the behalf of Hollice Espy, MD,as directed by  Hollice Espy, MD while in the presence of Hollice Espy, [place] [address], [place], [place] [address] [phone]

## 2021-04-24 ENCOUNTER — Ambulatory Visit (INDEPENDENT_AMBULATORY_CARE_PROVIDER_SITE_OTHER): Payer: Medicare Other | Admitting: Urology

## 2021-04-24 ENCOUNTER — Other Ambulatory Visit: Payer: Self-pay

## 2021-04-24 DIAGNOSIS — C642 Malignant neoplasm of left kidney, except renal pelvis: Secondary | ICD-10-CM

## 2021-04-25 NOTE — Progress Notes (Signed)
Patient rescheduled until after imaging completed ?

## 2021-04-29 ENCOUNTER — Ambulatory Visit
Admission: RE | Admit: 2021-04-29 | Discharge: 2021-04-29 | Disposition: A | Discharge status: Home / Self Care | Payer: Medicare Other | Source: Ambulatory Visit | Attending: Oncology | Admitting: Oncology

## 2021-04-29 ENCOUNTER — Inpatient Hospital Stay: Payer: Medicare Other | Attending: Oncology

## 2021-04-29 ENCOUNTER — Other Ambulatory Visit: Payer: Self-pay

## 2021-04-29 DIAGNOSIS — F79 Unspecified intellectual disabilities: Secondary | ICD-10-CM | POA: Diagnosis not present

## 2021-04-29 DIAGNOSIS — C37 Malignant neoplasm of thymus: Secondary | ICD-10-CM | POA: Diagnosis not present

## 2021-04-29 DIAGNOSIS — C642 Malignant neoplasm of left kidney, except renal pelvis: Secondary | ICD-10-CM | POA: Insufficient documentation

## 2021-04-29 DIAGNOSIS — D4989 Neoplasm of unspecified behavior of other specified sites: Secondary | ICD-10-CM | POA: Insufficient documentation

## 2021-04-29 DIAGNOSIS — Z85820 Personal history of malignant melanoma of skin: Secondary | ICD-10-CM | POA: Diagnosis not present

## 2021-04-29 DIAGNOSIS — Z79899 Other long term (current) drug therapy: Secondary | ICD-10-CM | POA: Diagnosis not present

## 2021-04-29 DIAGNOSIS — J8410 Pulmonary fibrosis, unspecified: Secondary | ICD-10-CM | POA: Diagnosis not present

## 2021-04-29 DIAGNOSIS — Z9049 Acquired absence of other specified parts of digestive tract: Secondary | ICD-10-CM | POA: Diagnosis not present

## 2021-04-29 DIAGNOSIS — Z923 Personal history of irradiation: Secondary | ICD-10-CM | POA: Diagnosis not present

## 2021-04-29 DIAGNOSIS — J941 Fibrothorax: Secondary | ICD-10-CM | POA: Diagnosis not present

## 2021-04-29 DIAGNOSIS — G7000 Myasthenia gravis without (acute) exacerbation: Secondary | ICD-10-CM | POA: Diagnosis not present

## 2021-04-29 DIAGNOSIS — D150 Benign neoplasm of thymus: Secondary | ICD-10-CM | POA: Diagnosis not present

## 2021-04-29 LAB — CBC WITH DIFFERENTIAL/PLATELET
Abs Immature Granulocytes: 0.02 10*3/uL (ref 0.00–0.07)
Basophils Absolute: 0 10*3/uL (ref 0.0–0.1)
Basophils Relative: 0 %
Eosinophils Absolute: 0.1 10*3/uL (ref 0.0–0.5)
Eosinophils Relative: 1 %
HCT: 41.5 % (ref 36.0–46.0)
Hemoglobin: 13.5 g/dL (ref 12.0–15.0)
Immature Granulocytes: 0 %
Lymphocytes Relative: 12 %
Lymphs Abs: 0.9 10*3/uL (ref 0.7–4.0)
MCH: 31 pg (ref 26.0–34.0)
MCHC: 32.5 g/dL (ref 30.0–36.0)
MCV: 95.4 fL (ref 80.0–100.0)
Monocytes Absolute: 0.6 10*3/uL (ref 0.1–1.0)
Monocytes Relative: 8 %
Neutro Abs: 5.7 10*3/uL (ref 1.7–7.7)
Neutrophils Relative %: 79 %
Platelets: 174 10*3/uL (ref 150–400)
RBC: 4.35 MIL/uL (ref 3.87–5.11)
RDW: 15.7 % — ABNORMAL HIGH (ref 11.5–15.5)
WBC: 7.2 10*3/uL (ref 4.0–10.5)
nRBC: 0 % (ref 0.0–0.2)

## 2021-04-29 LAB — COMPREHENSIVE METABOLIC PANEL
ALT: 22 U/L (ref 0–44)
AST: 28 U/L (ref 15–41)
Albumin: 3.7 g/dL (ref 3.5–5.0)
Alkaline Phosphatase: 57 U/L (ref 38–126)
Anion gap: 7 (ref 5–15)
BUN: 23 mg/dL (ref 8–23)
CO2: 27 mmol/L (ref 22–32)
Calcium: 8.6 mg/dL — ABNORMAL LOW (ref 8.9–10.3)
Chloride: 99 mmol/L (ref 98–111)
Creatinine, Ser: 1.15 mg/dL — ABNORMAL HIGH (ref 0.44–1.00)
GFR, Estimated: 52 mL/min — ABNORMAL LOW (ref >60)
Glucose, Bld: 102 mg/dL — ABNORMAL HIGH (ref 70–99)
Potassium: 3.6 mmol/L (ref 3.5–5.1)
Sodium: 133 mmol/L — ABNORMAL LOW (ref 135–145)
Total Bilirubin: 0.6 mg/dL (ref 0.3–1.2)
Total Protein: 6.7 g/dL (ref 6.5–8.1)

## 2021-04-29 MED ORDER — IOHEXOL 300 MG/ML  SOLN
75.0000 mL | Freq: Once | INTRAMUSCULAR | Status: AC | PRN
  Administered 2021-04-29: 75 mL via INTRAVENOUS

## 2021-05-01 ENCOUNTER — Ambulatory Visit: Payer: Medicare Other | Admitting: Oncology

## 2021-05-02 ENCOUNTER — Ambulatory Visit
Admission: RE | Admit: 2021-05-02 | Discharge: 2021-05-02 | Disposition: A | Discharge status: Home / Self Care | Payer: Medicare Other | Source: Ambulatory Visit | Attending: Interventional Radiology | Admitting: Interventional Radiology

## 2021-05-02 ENCOUNTER — Other Ambulatory Visit: Payer: Self-pay

## 2021-05-02 DIAGNOSIS — Z85528 Personal history of other malignant neoplasm of kidney: Secondary | ICD-10-CM | POA: Diagnosis not present

## 2021-05-02 DIAGNOSIS — K5730 Diverticulosis of large intestine without perforation or abscess without bleeding: Secondary | ICD-10-CM | POA: Diagnosis not present

## 2021-05-02 DIAGNOSIS — N2889 Other specified disorders of kidney and ureter: Secondary | ICD-10-CM | POA: Insufficient documentation

## 2021-05-02 DIAGNOSIS — R160 Hepatomegaly, not elsewhere classified: Secondary | ICD-10-CM | POA: Diagnosis not present

## 2021-05-02 DIAGNOSIS — N281 Cyst of kidney, acquired: Secondary | ICD-10-CM | POA: Diagnosis not present

## 2021-05-02 MED ORDER — GADOBUTROL 1 MMOL/ML IV SOLN
9.0000 mL | Freq: Once | INTRAVENOUS | Status: AC | PRN
  Administered 2021-05-02: 9 mL via INTRAVENOUS

## 2021-05-08 ENCOUNTER — Inpatient Hospital Stay: Payer: Medicare Other | Admitting: Oncology

## 2021-05-14 ENCOUNTER — Encounter: Payer: Self-pay | Admitting: Oncology

## 2021-05-14 ENCOUNTER — Other Ambulatory Visit: Payer: Self-pay

## 2021-05-14 ENCOUNTER — Inpatient Hospital Stay (HOSPITAL_BASED_OUTPATIENT_CLINIC_OR_DEPARTMENT_OTHER): Payer: Medicare Other | Admitting: Oncology

## 2021-05-14 ENCOUNTER — Telehealth: Payer: Self-pay

## 2021-05-14 VITALS — BP 132/87 | HR 81 | Temp 96.7°F | Resp 18 | Wt 206.4 lb

## 2021-05-14 DIAGNOSIS — C37 Malignant neoplasm of thymus: Secondary | ICD-10-CM | POA: Diagnosis not present

## 2021-05-14 DIAGNOSIS — D4989 Neoplasm of unspecified behavior of other specified sites: Secondary | ICD-10-CM

## 2021-05-14 DIAGNOSIS — M7989 Other specified soft tissue disorders: Secondary | ICD-10-CM | POA: Diagnosis not present

## 2021-05-14 DIAGNOSIS — Z923 Personal history of irradiation: Secondary | ICD-10-CM | POA: Diagnosis not present

## 2021-05-14 DIAGNOSIS — G7000 Myasthenia gravis without (acute) exacerbation: Secondary | ICD-10-CM | POA: Diagnosis not present

## 2021-05-14 DIAGNOSIS — R296 Repeated falls: Secondary | ICD-10-CM

## 2021-05-14 DIAGNOSIS — C642 Malignant neoplasm of left kidney, except renal pelvis: Secondary | ICD-10-CM

## 2021-05-14 DIAGNOSIS — Z79899 Other long term (current) drug therapy: Secondary | ICD-10-CM | POA: Diagnosis not present

## 2021-05-14 DIAGNOSIS — Z9049 Acquired absence of other specified parts of digestive tract: Secondary | ICD-10-CM | POA: Diagnosis not present

## 2021-05-14 DIAGNOSIS — Z85820 Personal history of malignant melanoma of skin: Secondary | ICD-10-CM | POA: Diagnosis not present

## 2021-05-14 NOTE — Progress Notes (Signed)
Patient here for follow up. Pt's sister reports that patient has had a couple of falls and now has swelling to right leg after last fall.  ?

## 2021-05-14 NOTE — Progress Notes (Signed)
?Hematology/Oncology Progress note ?[phone] [phone] ?  ? ? ? ?Patient Care Team: ?Einar Pheasant, MD as PCP - General (Internal Medicine) ?Telford Nab, RN as Sales executive ? ?REFERRING PROVIDER: ?Einar Pheasant, MD  ?CHIEF COMPLAINTS/REASON FOR VISIT:  ?Follow-up for thymoma, history of RCC ? ?HISTORY OF PRESENTING ILLNESS:  ? ?Madison Burns is a  71 y.o.  female with PMH listed below was seen in consultation at the request of  Einar Pheasant, MD  for evaluation of thymoma.  ?Patient was accompanied by her sister today.  ?History was obtained from her sister.  Patient has intellectual disability ?Patient initially underwent a CT abdomen pelvis with contrast on 02/07/2019 for worsening umbilical pain.  It reviewed incidental 3 cm renal mass on the left.  Subsequent MRCP 02/21/2019 performed for the purpose of right upper quadrant pain as well as renal mass in question.  MRI 02/20/2018 showed 3.2 x 2.6 left lower pole endophytic renal mass, consistent with possible renal cell carcinoma.  No signs of metastatic disease in abdomen.  Adrenal lesion more suggestive adenomas.  There was an incidental finding of lobular area along the anterior mediastinum, questionably e mediastinal mass. ? CT scan 02/07/2019 showed lobulated internally calcified mass of the anterior mediastinum which effaces the central portion of the left brachiocephalic vein and a very closely abuts the tubular ascending aorta, 5.8 x 4.9 x 4.1 cm.  Adjacent pleural nodularity about the right upper lobe. ?3 mm pulmonary nodule of the right upper lobe, hepatic steatosis. ? ?#Patient has a chronic history of myasthenia gravis, initially diagnosed in 2011.  Patient had been treated with prednisone for prolonged period of time.  Recently she has been off prednisone and is currently taking methotrexate and pyridostigmine for symptoms.  Per sister, patient does not complain much of weakness.  At her baseline.  She is  able to feed and dress herself.  She needs some assistance from her sister for bathing.  She likes to watch TV. ? ?Patient has been seen by Dr. Genevive Bi and Dr. Erlene Quan.  Patient's sister would like to entertain the diagnosis and potential therapy. ?Patient underwent CT-guided needle biopsy of the anterior mediastinal mass, as well as biopsy of the kidney mass ?Anterior mediastinal mass biopsy pathology showed thymoma, predominantly B2 patent with subsets of a B3 pattern. ?Left kidney mass showed renal cell carcinoma,tumor cannot be reliably subtyped based on this limited sample and the IHC profile  ?She has a history of melanoma on her neck in 2017.  ?Sister is POA. Patient lives with sister ? ?#Cancer treatment ?04/27/2019-05/18/2019 patient status post 2 cycles of dose reduced carboplatin and etoposide treatment concurrently with radiation. ?April 2021, patient  finished radiation treatments.. ? ?A# 08/17/2019-08/18/2019 ?Left  RCC, patient was admitted.  Patient underwent cystoscopy with Dr. Tammi Klippel with retrograde ureteral stent placement for renal protection during cryoablation.  The patient was taken to IR for an image guided left renal mass cryoablation by Dr. Laurence Ferrari.  Procedure was complicated by perinephric bleeding following the procedure.  She was monitored closely and remained hemodynamically stable. ?admitted from 10/22/2019-19 2021 due to A. fib with RVR, secondary to sepsis and respiratory failure with pneumonia.  Patient was also seen by neurology for suspected MG exacerbation secondary to acute infection. ? ? ?Left RCC s/p ablation 10/05/2020  ?10/25/2020 CT chest w contrast showed stable disease. No signs of recurrence . ? ?INTERVAL HISTORY ?Madison Burns is a 71 y.o. female who has above history reviewed by me  today presents for follow up visit for management of malignant thymoma, left renal cell carcinoma ?Problems and complaints are listed below: ?Patient was accompanied by sister.  Due to development  delay, patient cannot provide much history.  History was obtained from sister.  ?Sister reports frequent fall episodes ?First episode was in January 2023, patient had fracture of anterior alveolar process involving the maxillary central incisor teeth. ?Patient had a CT scan of the head which showed no active intracranial bleeding. ? ?Per sister, patient had additional 4 episodes on 03/22/2021, and 03/28/2021. ?There is no report of patient losing consciousness, head injury. ? ?Patient reports that her head hurts. ?Sister has recently noticed that patient has right lower extremity swelling. ? ? ? ? ?Review of Systems  ?Unable to perform ROS: Other (Intellectual Disability)  ?Cardiovascular:  Positive for leg swelling.  ?Gastrointestinal:  Negative for diarrhea and nausea.  ?Genitourinary:  Negative for dysuria and frequency.   ?Neurological:   ?     Frequent falls,  ? ?MEDICAL HISTORY:  ?Past Medical History:  ?Diagnosis Date  ? Cancer of kidney ([place]) 2021  ? Complication of anesthesia   ? Myasthenia gravis  ? GERD (gastroesophageal reflux disease)   ? History of seizure disorder   ? Hypercholesterolemia   ? Hypertension   ? no longer has it. wt loss  ? Hypothyroidism   ? Irritable bowel syndrome   ? Melanoma ([place]) 2017  ? Melanoma on neck  ? Myasthenia gravis ([place]) 2011  ? OSA on CPAP   ? Thymoma, malignant ([place])   ? ? ?SURGICAL HISTORY: ?Past Surgical History:  ?Procedure Laterality Date  ? ABDOMINAL HYSTERECTOMY  1990  ? APPENDECTOMY  1999  ? CHOLECYSTECTOMY  2013  ? COLONOSCOPY WITH PROPOFOL N/A 06/30/2017  ? Procedure: COLONOSCOPY WITH PROPOFOL;  Surgeon: Toledo, Benay Pike, MD;  Location: [facility];  Service: Gastroenterology;  Laterality: N/A;  ? CYSTOSCOPY W/ URETERAL STENT PLACEMENT Left 08/17/2019  ? Procedure: CYSTOSCOPY WITH RETROGRADE PYELOGRAM/URETERAL STENT PLACEMENT;  Surgeon: Alexis Frock, MD;  [facility];  Service: Urology;  Laterality: Left;  30 MINS  ? ESOPHAGOGASTRODUODENOSCOPY (EGD) WITH  PROPOFOL N/A 06/30/2017  ? Procedure: ESOPHAGOGASTRODUODENOSCOPY (EGD) WITH PROPOFOL;  Surgeon: Toledo, Benay Pike, MD;  Location: [facility];  Service: Gastroenterology;  Laterality: N/A;  ? IR RADIOLOGIST EVAL & MGMT  06/28/2019  ? IR RADIOLOGIST EVAL & MGMT  09/14/2019  ? IR RADIOLOGIST EVAL & MGMT  03/28/2020  ? IR RADIOLOGIST EVAL & MGMT  10/10/2020  ? RADIOFREQUENCY ABLATION Left 08/17/2019  ? Procedure: LEFT RENAL CRYO ABLATION;  Surgeon: Jacqulynn Cadet, MD;  [facility];  Service: Anesthesiology;  Laterality: Left;  ? TONSILLECTOMY  1959  ? ? ?SOCIAL HISTORY: ?Social History  ? ?Socioeconomic History  ? Marital status: Single  ?  Spouse name: Not on file  ? Number of children: 0  ? Years of education: Special Ed  ? Highest education level: Not on file  ?Occupational History  ? Occupation: Unemployed  ?Tobacco Use  ? Smoking status: Never  ? Smokeless tobacco: Never  ?Vaping Use  ? Vaping Use: Never used  ?Substance and Sexual Activity  ? Alcohol use: No  ?  Alcohol/week: 0.0 standard drinks  ? Drug use: No  ? Sexual activity: Not Currently  ?Other Topics Concern  ? Not on file  ?Social History Narrative  ? 06/23/17 lives with sister, [name]  ? Regular exercise-no  ? Caffeine Use-yes  ? ?Social Determinants of Health  ? ?  Financial Resource Strain: Not on file  ?Food Insecurity: Not on file  ?Transportation Needs: Not on file  ?Physical Activity: Not on file  ?Stress: Not on file  ?Social Connections: Not on file  ?Intimate Partner Violence: Not on file  ? ? ?FAMILY HISTORY: ?Family History  ?Problem Relation Age of Onset  ? Colon cancer Maternal Grandfather   ?     stomach/colon  ? Heart disease Father   ?     myocardial infarction  ? Hyperlipidemia Father   ? Hyperlipidemia Sister   ? Diabetes Sister   ? Bone cancer Other   ?     cousin  ? Alzheimer's disease Mother   ?     maternal aunts  ? Skin cancer Mother   ? Myasthenia gravis Brother   ?     ocular  ? Skin cancer Brother   ? Stroke Paternal  Grandfather   ? Breast cancer Neg Hx   ? ? ?ALLERGIES:  has No Known Allergies. ? ?MEDICATIONS:  ?Current Outpatient Medications  ?Medication Sig Dispense Refill  ? acetaminophen (TYLENOL) 325 MG tablet Take 2 t

## 2021-05-14 NOTE — Telephone Encounter (Signed)
Referral for [facility] PT/OT faxed to [facility].  ?

## 2021-05-15 ENCOUNTER — Other Ambulatory Visit: Payer: Self-pay | Admitting: Neurology

## 2021-05-15 ENCOUNTER — Other Ambulatory Visit: Payer: Self-pay | Admitting: Internal Medicine

## 2021-05-16 ENCOUNTER — Ambulatory Visit
Admission: RE | Admit: 2021-05-16 | Discharge: 2021-05-16 | Disposition: A | Discharge status: Home / Self Care | Payer: Medicare Other | Source: Ambulatory Visit | Attending: Oncology | Admitting: Oncology

## 2021-05-16 DIAGNOSIS — M7989 Other specified soft tissue disorders: Secondary | ICD-10-CM | POA: Insufficient documentation

## 2021-05-20 NOTE — Progress Notes (Signed)
? ?05/21/21 ?8:28 AM  ? ?[name] ?08-12-1950 ?[id number] ? ?Referring provider:  ?Einar Pheasant, MD ?[address] ?[address] ?[place],  [place] [address] ?Chief Complaint  ?Patient presents with  ? Renal cell cancer, left ([place])  ? ? ? ?HPI: ?MCKYNZIE LIWANAG is a 71 y.o.female with a personal history of rena; cell carcinoma, left, who presents today for a 1 year follow-up with MRI results.  ? ?She initially underwent a CT abdomen pelvis with contrast on 03/09/2018 for worsening umbilical pain. This revealed an incidental possible 3 cm renal mass on the left, however there was significant artifact appreciated secondary to motion ?  ?This was followed up with an MRCP performed for the purpose of right upper quadrant pain as well as the renal mass in question.  MRI performed on 02/20/2018 showed a 3.2 x 2.6 left lower pole endophytic renal mass which was felt to possibly be enhancing and consistent with possible renal cell carcinoma. ?  ?She underwent a CT biopsy on 03/28/2019.Biopsy consistent with renal cell carcinoma.  ?  ?She underwent successful cryoablation on 08/17/2018 one of the left renal mass.  ? ?She is followed by Dr. Tasia Catchings.  ? ?Her most recent imaging in the form of MRI of abdomen on 05/02/2021 visualized stable post ablation appearance in the posteroinferior pole left ?kidney. No new suspicious nodularity/enhancement identified. No lymphadenopathy.  ? ?She is accompanied by a family member today. She is reported to have fallen. She has an MRI scheduled later today to rule out strokes.  ? ?PMH: ?Past Medical History:  ?Diagnosis Date  ? Cancer of kidney ([place]) 2021  ? Complication of anesthesia   ? Myasthenia gravis  ? GERD (gastroesophageal reflux disease)   ? History of seizure disorder   ? Hypercholesterolemia   ? Hypertension   ? no longer has it. wt loss  ? Hypothyroidism   ? Irritable bowel syndrome   ? Melanoma ([place]) 2017  ? Melanoma on neck  ? Myasthenia gravis ([place]) 2011  ? OSA on CPAP   ?  Thymoma, malignant ([place])   ? ? ?Surgical History: ?Past Surgical History:  ?Procedure Laterality Date  ? ABDOMINAL HYSTERECTOMY  1990  ? APPENDECTOMY  1999  ? CHOLECYSTECTOMY  2013  ? COLONOSCOPY WITH PROPOFOL N/A 06/30/2017  ? Procedure: COLONOSCOPY WITH PROPOFOL;  Surgeon: Toledo, Benay Pike, MD;  Location: [facility];  Service: Gastroenterology;  Laterality: N/A;  ? CYSTOSCOPY W/ URETERAL STENT PLACEMENT Left 08/17/2019  ? Procedure: CYSTOSCOPY WITH RETROGRADE PYELOGRAM/URETERAL STENT PLACEMENT;  Surgeon: Alexis Frock, MD;  [facility];  Service: Urology;  Laterality: Left;  30 MINS  ? ESOPHAGOGASTRODUODENOSCOPY (EGD) WITH PROPOFOL N/A 06/30/2017  ? Procedure: ESOPHAGOGASTRODUODENOSCOPY (EGD) WITH PROPOFOL;  Surgeon: Toledo, Benay Pike, MD;  Location: [facility];  Service: Gastroenterology;  Laterality: N/A;  ? IR RADIOLOGIST EVAL & MGMT  06/28/2019  ? IR RADIOLOGIST EVAL & MGMT  09/14/2019  ? IR RADIOLOGIST EVAL & MGMT  03/28/2020  ? IR RADIOLOGIST EVAL & MGMT  10/10/2020  ? RADIOFREQUENCY ABLATION Left 08/17/2019  ? Procedure: LEFT RENAL CRYO ABLATION;  Surgeon: Jacqulynn Cadet, MD;  [facility];  Service: Anesthesiology;  Laterality: Left;  ? TONSILLECTOMY  1959  ? ? ?Home Medications:  ?Allergies as of 05/21/2021   ?No Known Allergies ?  ? ?  ?Medication List  ?  ? ?  ? Accurate as of May 21, 2021 11:59 PM. If you have any questions, ask your nurse or doctor.  ?  ?  ? ?  ? ?  acetaminophen 325 MG tablet ?Commonly known as: TYLENOL ?Take 2 tablets (650 mg total) by mouth every 6 (six) hours as needed for mild pain (or Fever >/= 101). ?  ?amiodarone 200 MG tablet ?Commonly known as: PACERONE ?Take 1 tablet (200 mg total) by mouth daily. ?  ?B-complex with vitamin C tablet ?Take 1 tablet by mouth every evening. ?  ?benzonatate 100 MG capsule ?Commonly known as: TESSALON ?TAKE 1 CAPSULE BY MOUTH TWICE DAILY AS NEEDED COUGH ?  ?Biotin 10000 MCG Tabs ?Take 10,000 mcg by mouth daily at 2 PM. (1430) ?   ?CITRACAL +D3 PO ?Take 1 tablet by mouth daily. ?  ?diltiazem 180 MG 24 hr capsule ?Commonly known as: TIAZAC ?Take by mouth. ?  ?DULoxetine 30 MG capsule ?Commonly known as: CYMBALTA ?TAKE 1 CAPSULE BY MOUTH ONCE DAILY ?  ?folic acid 1 MG tablet ?Commonly known as: FOLVITE ?TAKE 2 TABLETS BY MOUTH ONCE DAILY ?  ?Garlic 3329 MG Caps ?Take 1,000 mg by mouth in the morning, at noon, and at bedtime. (0800, 1430, 2000) ?  ?HYDROcodone-acetaminophen 5-325 MG tablet ?Commonly known as: NORCO/VICODIN ?Take 1 tablet by mouth every 4 (four) hours as needed for moderate pain. ?  ?levothyroxine 75 MCG tablet ?Commonly known as: SYNTHROID ?TAKE 1 TABLET BY MOUTH ONCE DAILY ON AN EMPTY STOMACH. WAIT 30 MINUTES BEFORE TAKING OTHER MEDS. ?  ?methotrexate 2.5 MG tablet ?TAKE 4 TABLETS BY MOUTH ONCE A WEEK WITHFOLIC ACID ?  ?pantoprazole 40 MG tablet ?Commonly known as: PROTONIX ?Take 40 mg by mouth daily. ?  ?PROBIOTIC MULTI-ENZYME PO ?Take 1 capsule by mouth in the morning and at bedtime. Probiotic Multi-Enzyme Digestive Formula ?  ?pyridostigmine 60 MG tablet ?Commonly known as: MESTINON ?TAKE 1 TABLET BY MOUTH 3 TIMES DAILY ?  ?risperiDONE 1 MG tablet ?Commonly known as: RISPERDAL ?Take 1 mg by mouth at bedtime. ?  ?tolterodine 4 MG 24 hr capsule ?Commonly known as: DETROL [place] ?TAKE 1 CAPSULE BY MOUTH ONCE EVERY MORNING ?  ?vitamin C 1000 MG tablet ?Take 1,000 mg by mouth at bedtime. ?  ?vitamin E 180 MG (400 UNITS) capsule ?Take 400 Units by mouth daily at 2 PM. 1430 ?  ? ?  ? ? ?Allergies: No Known Allergies ? ?Family History: ?Family History  ?Problem Relation Age of Onset  ? Colon cancer Maternal Grandfather   ?     stomach/colon  ? Heart disease Father   ?     myocardial infarction  ? Hyperlipidemia Father   ? Hyperlipidemia Sister   ? Diabetes Sister   ? Bone cancer Other   ?     cousin  ? Alzheimer's disease Mother   ?     maternal aunts  ? Skin cancer Mother   ? Myasthenia gravis Brother   ?     ocular  ? Skin cancer  Brother   ? Stroke Paternal Grandfather   ? Breast cancer Neg Hx   ? ? ?Social History:  reports that she has never smoked. She has never used smokeless tobacco. She reports that she does not drink alcohol and does not use drugs. ? ? ?Physical Exam: ?BP 120/79   Pulse 76   Ht '5\' 4"'$  (1.626 m)   Wt 209 lb (94.8 kg)   BMI 35.87 kg/m?   ?Constitutional:  Alert and oriented, No acute distress. ?HEENT: [place] AT, moist mucus membranes.  Trachea midline, no masses. ?Cardiovascular: No clubbing, cyanosis, or edema. ?Respiratory: Normal respiratory effort, no increased work  of breathing. ?Skin: No rashes, bruises or suspicious lesions. ?Neurologic: Grossly intact, no focal deficits, moving all 4 extremities. ?Psychiatric: Normal mood and affect. ? ?Laboratory Data: ?Lab Results  ?Component Value Date  ? CREATININE 1.15 (H) 04/29/2021  ? ?Lab Results  ?Component Value Date  ? HGBA1C 5.4 12/14/2018  ? ? ?Pertinent Imaging: ?CLINICAL DATA:  History of renal cell carcinoma status post ?ablation, follow-up ?  ?EXAM: ?MRI ABDOMEN WITHOUT AND WITH CONTRAST ?  ?TECHNIQUE: ?Multiplanar multisequence MR imaging of the abdomen was performed ?both before and after the administration of intravenous contrast. ?  ?CONTRAST:  34m GADAVIST GADOBUTROL 1 MMOL/ML IV SOLN ?  ?COMPARISON:  MRI abdomen 10/05/2020 ?  ?FINDINGS: ?Lower chest: No acute findings. ?  ?Hepatobiliary: Liver is enlarged measuring 21.4 cm in length. No ?suspicious hepatic mass identified. Gallbladder is surgically ?absent. No significant biliary ductal dilatation identified. ?  ?Pancreas: No mass, inflammatory changes, or other parenchymal ?abnormality identified. ?  ?Spleen:  Upper normal size. ?  ?Adrenals/Urinary Tract: Nodular thickening of the left adrenal gland ?measuring 1.6 cm which demonstrates signal dropout on out of phase ?T1, suggesting adenoma. Right adrenal gland is normal. Similar size ?and appearance of the post ablation lesion at the posteroinferior ?pole  of the left kidney measuring 4 x 2.5 cm in axial dimensions. No ?new suspicious nodularity/enhancement identified. A few renal cysts ?measuring up to 2.3 cm on the left. No hydronephrosis. ?  ?Stomach/Bowel: No evid

## 2021-05-21 ENCOUNTER — Ambulatory Visit
Admission: RE | Admit: 2021-05-21 | Discharge: 2021-05-21 | Disposition: A | Discharge status: Home / Self Care | Payer: Medicare Other | Source: Ambulatory Visit | Attending: Oncology | Admitting: Oncology

## 2021-05-21 ENCOUNTER — Ambulatory Visit (INDEPENDENT_AMBULATORY_CARE_PROVIDER_SITE_OTHER): Payer: Medicare Other | Admitting: Urology

## 2021-05-21 VITALS — BP 120/79 | HR 76 | Ht 64.0 in | Wt 209.0 lb

## 2021-05-21 DIAGNOSIS — R296 Repeated falls: Secondary | ICD-10-CM | POA: Diagnosis not present

## 2021-05-21 DIAGNOSIS — C642 Malignant neoplasm of left kidney, except renal pelvis: Secondary | ICD-10-CM | POA: Insufficient documentation

## 2021-05-21 DIAGNOSIS — D4989 Neoplasm of unspecified behavior of other specified sites: Secondary | ICD-10-CM | POA: Insufficient documentation

## 2021-05-21 DIAGNOSIS — G319 Degenerative disease of nervous system, unspecified: Secondary | ICD-10-CM | POA: Diagnosis not present

## 2021-05-21 MED ORDER — GADOBUTROL 1 MMOL/ML IV SOLN
10.0000 mL | Freq: Once | INTRAVENOUS | Status: AC | PRN
  Administered 2021-05-21: 10 mL via INTRAVENOUS

## 2021-05-22 ENCOUNTER — Telehealth: Payer: Self-pay

## 2021-05-22 ENCOUNTER — Telehealth: Payer: Self-pay | Admitting: *Deleted

## 2021-05-22 NOTE — Telephone Encounter (Signed)
Thank you :)

## 2021-05-22 NOTE — Telephone Encounter (Signed)
-----   Message from Earlie Server, MD sent at 05/21/2021  9:17 PM EDT ----- ?Please let patient's sister know that MRI brain showed no cancer spread in brain. There is a slow growing brain tumor which has features compatible with meningioma. The tumor touches  trigeminal nerve .  ?To be safe, I recommend her to see neurosurgeon. If ok with her, please refer to neurosurgery Dr.Yarborough.  ?

## 2021-05-22 NOTE — Telephone Encounter (Signed)
Called to inform patient's sister, [name], of MRI results and Dr. Collie Siad recommendations. No answer, left message to call back.  ?

## 2021-05-22 NOTE — Telephone Encounter (Signed)
Called report ? ?IMPRESSION: ?1.4 x 1.4 cm dural-based mass centered along the medial aspect of ?the anterior left tentorium/left petroclinoid ligament, as ?described. This mass has only mildly increased in size from the ?prior brain MRI of 01/24/2010. The imaging features and slow growth ?are most compatible with a meningioma. The mass abuts, and appears ?to partially encase, the left trigeminal nerve within the prepontine ?cistern and at the entrance to [employer]. Additionally, the mass ?abuts the posterior aspect of the left cavernous sinus without ?definite cavernous sinus invasion. ?  ?No evidence of intracranial metastatic disease. ?  ?Mild chronic small vessel ischemic changes within the cerebral white ?matter, progressed from the prior brain MRI of 01/24/2010. ?  ?Mild generalized cerebral atrophy, also progressed. ?  ?  ?Electronically Signed ?  By: Kellie Simmering D.O. ?  On: 05/21/2021 19:08 ?  ?

## 2021-05-23 ENCOUNTER — Other Ambulatory Visit: Payer: Self-pay

## 2021-05-23 DIAGNOSIS — D329 Benign neoplasm of meninges, unspecified: Secondary | ICD-10-CM

## 2021-05-23 NOTE — Telephone Encounter (Signed)
Spoke with [name] and informed her of MRI results and Dr. Collie Siad recommendation to refer patient to Neurosurgeon, Dr. Izora Ribas. Referral placed. Last encounter note, demographics and insurance faxed over. Informed [name], their office will be on contact to set up appointment ? ?

## 2021-05-27 DIAGNOSIS — Z85528 Personal history of other malignant neoplasm of kidney: Secondary | ICD-10-CM | POA: Diagnosis not present

## 2021-05-27 DIAGNOSIS — K219 Gastro-esophageal reflux disease without esophagitis: Secondary | ICD-10-CM | POA: Diagnosis not present

## 2021-05-27 DIAGNOSIS — I10 Essential (primary) hypertension: Secondary | ICD-10-CM | POA: Diagnosis not present

## 2021-05-27 DIAGNOSIS — G7000 Myasthenia gravis without (acute) exacerbation: Secondary | ICD-10-CM | POA: Diagnosis not present

## 2021-05-27 DIAGNOSIS — E7800 Pure hypercholesterolemia, unspecified: Secondary | ICD-10-CM | POA: Diagnosis not present

## 2021-05-27 DIAGNOSIS — E039 Hypothyroidism, unspecified: Secondary | ICD-10-CM | POA: Diagnosis not present

## 2021-05-27 DIAGNOSIS — D329 Benign neoplasm of meninges, unspecified: Secondary | ICD-10-CM | POA: Diagnosis not present

## 2021-05-27 DIAGNOSIS — Z85820 Personal history of malignant melanoma of skin: Secondary | ICD-10-CM | POA: Diagnosis not present

## 2021-05-27 DIAGNOSIS — G4733 Obstructive sleep apnea (adult) (pediatric): Secondary | ICD-10-CM | POA: Diagnosis not present

## 2021-05-27 DIAGNOSIS — Z9181 History of falling: Secondary | ICD-10-CM | POA: Diagnosis not present

## 2021-05-27 DIAGNOSIS — K589 Irritable bowel syndrome without diarrhea: Secondary | ICD-10-CM | POA: Diagnosis not present

## 2021-05-27 DIAGNOSIS — C37 Malignant neoplasm of thymus: Secondary | ICD-10-CM | POA: Diagnosis not present

## 2021-05-30 DIAGNOSIS — G7000 Myasthenia gravis without (acute) exacerbation: Secondary | ICD-10-CM | POA: Diagnosis not present

## 2021-05-30 DIAGNOSIS — K219 Gastro-esophageal reflux disease without esophagitis: Secondary | ICD-10-CM | POA: Diagnosis not present

## 2021-05-30 DIAGNOSIS — E7800 Pure hypercholesterolemia, unspecified: Secondary | ICD-10-CM | POA: Diagnosis not present

## 2021-05-30 DIAGNOSIS — D329 Benign neoplasm of meninges, unspecified: Secondary | ICD-10-CM | POA: Diagnosis not present

## 2021-05-30 DIAGNOSIS — Z85528 Personal history of other malignant neoplasm of kidney: Secondary | ICD-10-CM | POA: Diagnosis not present

## 2021-05-30 DIAGNOSIS — I10 Essential (primary) hypertension: Secondary | ICD-10-CM | POA: Diagnosis not present

## 2021-05-30 DIAGNOSIS — E039 Hypothyroidism, unspecified: Secondary | ICD-10-CM | POA: Diagnosis not present

## 2021-05-30 DIAGNOSIS — K589 Irritable bowel syndrome without diarrhea: Secondary | ICD-10-CM | POA: Diagnosis not present

## 2021-05-30 DIAGNOSIS — Z9181 History of falling: Secondary | ICD-10-CM | POA: Diagnosis not present

## 2021-05-30 DIAGNOSIS — C37 Malignant neoplasm of thymus: Secondary | ICD-10-CM | POA: Diagnosis not present

## 2021-05-30 DIAGNOSIS — G4733 Obstructive sleep apnea (adult) (pediatric): Secondary | ICD-10-CM | POA: Diagnosis not present

## 2021-05-30 DIAGNOSIS — Z85820 Personal history of malignant melanoma of skin: Secondary | ICD-10-CM | POA: Diagnosis not present

## 2021-06-03 DIAGNOSIS — I4891 Unspecified atrial fibrillation: Secondary | ICD-10-CM | POA: Diagnosis not present

## 2021-06-03 DIAGNOSIS — G4733 Obstructive sleep apnea (adult) (pediatric): Secondary | ICD-10-CM | POA: Diagnosis not present

## 2021-06-03 DIAGNOSIS — I10 Essential (primary) hypertension: Secondary | ICD-10-CM | POA: Diagnosis not present

## 2021-06-03 DIAGNOSIS — R0609 Other forms of dyspnea: Secondary | ICD-10-CM | POA: Diagnosis not present

## 2021-06-05 DIAGNOSIS — G7000 Myasthenia gravis without (acute) exacerbation: Secondary | ICD-10-CM | POA: Diagnosis not present

## 2021-06-05 DIAGNOSIS — Z9181 History of falling: Secondary | ICD-10-CM | POA: Diagnosis not present

## 2021-06-05 DIAGNOSIS — E7800 Pure hypercholesterolemia, unspecified: Secondary | ICD-10-CM | POA: Diagnosis not present

## 2021-06-05 DIAGNOSIS — G4733 Obstructive sleep apnea (adult) (pediatric): Secondary | ICD-10-CM | POA: Diagnosis not present

## 2021-06-05 DIAGNOSIS — Z85820 Personal history of malignant melanoma of skin: Secondary | ICD-10-CM | POA: Diagnosis not present

## 2021-06-05 DIAGNOSIS — K219 Gastro-esophageal reflux disease without esophagitis: Secondary | ICD-10-CM | POA: Diagnosis not present

## 2021-06-05 DIAGNOSIS — K589 Irritable bowel syndrome without diarrhea: Secondary | ICD-10-CM | POA: Diagnosis not present

## 2021-06-05 DIAGNOSIS — E039 Hypothyroidism, unspecified: Secondary | ICD-10-CM | POA: Diagnosis not present

## 2021-06-05 DIAGNOSIS — D329 Benign neoplasm of meninges, unspecified: Secondary | ICD-10-CM | POA: Diagnosis not present

## 2021-06-05 DIAGNOSIS — I10 Essential (primary) hypertension: Secondary | ICD-10-CM | POA: Diagnosis not present

## 2021-06-05 DIAGNOSIS — C37 Malignant neoplasm of thymus: Secondary | ICD-10-CM | POA: Diagnosis not present

## 2021-06-05 DIAGNOSIS — Z85528 Personal history of other malignant neoplasm of kidney: Secondary | ICD-10-CM | POA: Diagnosis not present

## 2021-06-07 DIAGNOSIS — D329 Benign neoplasm of meninges, unspecified: Secondary | ICD-10-CM | POA: Diagnosis not present

## 2021-06-07 DIAGNOSIS — I10 Essential (primary) hypertension: Secondary | ICD-10-CM | POA: Diagnosis not present

## 2021-06-07 DIAGNOSIS — G4733 Obstructive sleep apnea (adult) (pediatric): Secondary | ICD-10-CM | POA: Diagnosis not present

## 2021-06-07 DIAGNOSIS — K219 Gastro-esophageal reflux disease without esophagitis: Secondary | ICD-10-CM | POA: Diagnosis not present

## 2021-06-07 DIAGNOSIS — C37 Malignant neoplasm of thymus: Secondary | ICD-10-CM | POA: Diagnosis not present

## 2021-06-07 DIAGNOSIS — Z9181 History of falling: Secondary | ICD-10-CM | POA: Diagnosis not present

## 2021-06-07 DIAGNOSIS — Z85820 Personal history of malignant melanoma of skin: Secondary | ICD-10-CM | POA: Diagnosis not present

## 2021-06-07 DIAGNOSIS — E7800 Pure hypercholesterolemia, unspecified: Secondary | ICD-10-CM | POA: Diagnosis not present

## 2021-06-07 DIAGNOSIS — Z85528 Personal history of other malignant neoplasm of kidney: Secondary | ICD-10-CM | POA: Diagnosis not present

## 2021-06-07 DIAGNOSIS — K589 Irritable bowel syndrome without diarrhea: Secondary | ICD-10-CM | POA: Diagnosis not present

## 2021-06-07 DIAGNOSIS — E039 Hypothyroidism, unspecified: Secondary | ICD-10-CM | POA: Diagnosis not present

## 2021-06-07 DIAGNOSIS — G7000 Myasthenia gravis without (acute) exacerbation: Secondary | ICD-10-CM | POA: Diagnosis not present

## 2021-06-10 ENCOUNTER — Telehealth: Payer: Self-pay | Admitting: Emergency Medicine

## 2021-06-10 NOTE — Telephone Encounter (Signed)
Followed up on Cardiology referral. Pt had appt with [facility] clinic on 06/03/21. ?

## 2021-06-11 DIAGNOSIS — Z85820 Personal history of malignant melanoma of skin: Secondary | ICD-10-CM | POA: Diagnosis not present

## 2021-06-11 DIAGNOSIS — Z9181 History of falling: Secondary | ICD-10-CM | POA: Diagnosis not present

## 2021-06-11 DIAGNOSIS — D329 Benign neoplasm of meninges, unspecified: Secondary | ICD-10-CM | POA: Diagnosis not present

## 2021-06-11 DIAGNOSIS — I10 Essential (primary) hypertension: Secondary | ICD-10-CM | POA: Diagnosis not present

## 2021-06-11 DIAGNOSIS — K219 Gastro-esophageal reflux disease without esophagitis: Secondary | ICD-10-CM | POA: Diagnosis not present

## 2021-06-11 DIAGNOSIS — K589 Irritable bowel syndrome without diarrhea: Secondary | ICD-10-CM | POA: Diagnosis not present

## 2021-06-11 DIAGNOSIS — G4733 Obstructive sleep apnea (adult) (pediatric): Secondary | ICD-10-CM | POA: Diagnosis not present

## 2021-06-11 DIAGNOSIS — G7000 Myasthenia gravis without (acute) exacerbation: Secondary | ICD-10-CM | POA: Diagnosis not present

## 2021-06-11 DIAGNOSIS — E039 Hypothyroidism, unspecified: Secondary | ICD-10-CM | POA: Diagnosis not present

## 2021-06-11 DIAGNOSIS — C37 Malignant neoplasm of thymus: Secondary | ICD-10-CM | POA: Diagnosis not present

## 2021-06-11 DIAGNOSIS — E7800 Pure hypercholesterolemia, unspecified: Secondary | ICD-10-CM | POA: Diagnosis not present

## 2021-06-11 DIAGNOSIS — Z85528 Personal history of other malignant neoplasm of kidney: Secondary | ICD-10-CM | POA: Diagnosis not present

## 2021-06-13 DIAGNOSIS — G4733 Obstructive sleep apnea (adult) (pediatric): Secondary | ICD-10-CM | POA: Diagnosis not present

## 2021-06-13 DIAGNOSIS — C37 Malignant neoplasm of thymus: Secondary | ICD-10-CM | POA: Diagnosis not present

## 2021-06-13 DIAGNOSIS — Z85528 Personal history of other malignant neoplasm of kidney: Secondary | ICD-10-CM | POA: Diagnosis not present

## 2021-06-13 DIAGNOSIS — I10 Essential (primary) hypertension: Secondary | ICD-10-CM | POA: Diagnosis not present

## 2021-06-13 DIAGNOSIS — Z85820 Personal history of malignant melanoma of skin: Secondary | ICD-10-CM | POA: Diagnosis not present

## 2021-06-13 DIAGNOSIS — E039 Hypothyroidism, unspecified: Secondary | ICD-10-CM | POA: Diagnosis not present

## 2021-06-13 DIAGNOSIS — K589 Irritable bowel syndrome without diarrhea: Secondary | ICD-10-CM | POA: Diagnosis not present

## 2021-06-13 DIAGNOSIS — E7800 Pure hypercholesterolemia, unspecified: Secondary | ICD-10-CM | POA: Diagnosis not present

## 2021-06-13 DIAGNOSIS — G7000 Myasthenia gravis without (acute) exacerbation: Secondary | ICD-10-CM | POA: Diagnosis not present

## 2021-06-13 DIAGNOSIS — Z9181 History of falling: Secondary | ICD-10-CM | POA: Diagnosis not present

## 2021-06-13 DIAGNOSIS — D329 Benign neoplasm of meninges, unspecified: Secondary | ICD-10-CM | POA: Diagnosis not present

## 2021-06-13 DIAGNOSIS — K219 Gastro-esophageal reflux disease without esophagitis: Secondary | ICD-10-CM | POA: Diagnosis not present

## 2021-06-17 ENCOUNTER — Other Ambulatory Visit: Payer: Self-pay | Admitting: Neurology

## 2021-06-17 DIAGNOSIS — G4733 Obstructive sleep apnea (adult) (pediatric): Secondary | ICD-10-CM

## 2021-06-17 DIAGNOSIS — F73 Profound intellectual disabilities: Secondary | ICD-10-CM

## 2021-06-17 DIAGNOSIS — G40309 Generalized idiopathic epilepsy and epileptic syndromes, not intractable, without status epilepticus: Secondary | ICD-10-CM

## 2021-06-18 ENCOUNTER — Other Ambulatory Visit: Payer: Self-pay | Admitting: Neurology

## 2021-06-18 DIAGNOSIS — G40309 Generalized idiopathic epilepsy and epileptic syndromes, not intractable, without status epilepticus: Secondary | ICD-10-CM

## 2021-06-18 DIAGNOSIS — E039 Hypothyroidism, unspecified: Secondary | ICD-10-CM | POA: Diagnosis not present

## 2021-06-18 DIAGNOSIS — D329 Benign neoplasm of meninges, unspecified: Secondary | ICD-10-CM | POA: Diagnosis not present

## 2021-06-18 DIAGNOSIS — E7800 Pure hypercholesterolemia, unspecified: Secondary | ICD-10-CM | POA: Diagnosis not present

## 2021-06-18 DIAGNOSIS — F73 Profound intellectual disabilities: Secondary | ICD-10-CM

## 2021-06-18 DIAGNOSIS — G4733 Obstructive sleep apnea (adult) (pediatric): Secondary | ICD-10-CM

## 2021-06-18 DIAGNOSIS — I10 Essential (primary) hypertension: Secondary | ICD-10-CM | POA: Diagnosis not present

## 2021-06-18 DIAGNOSIS — G7000 Myasthenia gravis without (acute) exacerbation: Secondary | ICD-10-CM | POA: Diagnosis not present

## 2021-06-18 DIAGNOSIS — K589 Irritable bowel syndrome without diarrhea: Secondary | ICD-10-CM | POA: Diagnosis not present

## 2021-06-18 DIAGNOSIS — Z85820 Personal history of malignant melanoma of skin: Secondary | ICD-10-CM | POA: Diagnosis not present

## 2021-06-18 DIAGNOSIS — C37 Malignant neoplasm of thymus: Secondary | ICD-10-CM | POA: Diagnosis not present

## 2021-06-18 DIAGNOSIS — Z9181 History of falling: Secondary | ICD-10-CM | POA: Diagnosis not present

## 2021-06-18 DIAGNOSIS — Z85528 Personal history of other malignant neoplasm of kidney: Secondary | ICD-10-CM | POA: Diagnosis not present

## 2021-06-18 DIAGNOSIS — K219 Gastro-esophageal reflux disease without esophagitis: Secondary | ICD-10-CM | POA: Diagnosis not present

## 2021-06-26 DIAGNOSIS — K219 Gastro-esophageal reflux disease without esophagitis: Secondary | ICD-10-CM | POA: Diagnosis not present

## 2021-06-26 DIAGNOSIS — G7000 Myasthenia gravis without (acute) exacerbation: Secondary | ICD-10-CM | POA: Diagnosis not present

## 2021-06-26 DIAGNOSIS — E039 Hypothyroidism, unspecified: Secondary | ICD-10-CM | POA: Diagnosis not present

## 2021-06-26 DIAGNOSIS — Z85820 Personal history of malignant melanoma of skin: Secondary | ICD-10-CM | POA: Diagnosis not present

## 2021-06-26 DIAGNOSIS — C37 Malignant neoplasm of thymus: Secondary | ICD-10-CM | POA: Diagnosis not present

## 2021-06-26 DIAGNOSIS — Z9181 History of falling: Secondary | ICD-10-CM | POA: Diagnosis not present

## 2021-06-26 DIAGNOSIS — I10 Essential (primary) hypertension: Secondary | ICD-10-CM | POA: Diagnosis not present

## 2021-06-26 DIAGNOSIS — E7800 Pure hypercholesterolemia, unspecified: Secondary | ICD-10-CM | POA: Diagnosis not present

## 2021-06-26 DIAGNOSIS — G4733 Obstructive sleep apnea (adult) (pediatric): Secondary | ICD-10-CM | POA: Diagnosis not present

## 2021-06-26 DIAGNOSIS — K589 Irritable bowel syndrome without diarrhea: Secondary | ICD-10-CM | POA: Diagnosis not present

## 2021-06-26 DIAGNOSIS — D329 Benign neoplasm of meninges, unspecified: Secondary | ICD-10-CM | POA: Diagnosis not present

## 2021-06-26 DIAGNOSIS — Z85528 Personal history of other malignant neoplasm of kidney: Secondary | ICD-10-CM | POA: Diagnosis not present

## 2021-06-27 ENCOUNTER — Other Ambulatory Visit: Payer: Self-pay | Admitting: Oncology

## 2021-06-28 ENCOUNTER — Encounter: Payer: Self-pay | Admitting: Oncology

## 2021-07-01 DIAGNOSIS — D329 Benign neoplasm of meninges, unspecified: Secondary | ICD-10-CM | POA: Diagnosis not present

## 2021-07-01 DIAGNOSIS — G4733 Obstructive sleep apnea (adult) (pediatric): Secondary | ICD-10-CM | POA: Diagnosis not present

## 2021-07-01 DIAGNOSIS — Z9181 History of falling: Secondary | ICD-10-CM | POA: Diagnosis not present

## 2021-07-01 DIAGNOSIS — I10 Essential (primary) hypertension: Secondary | ICD-10-CM | POA: Diagnosis not present

## 2021-07-01 DIAGNOSIS — C37 Malignant neoplasm of thymus: Secondary | ICD-10-CM | POA: Diagnosis not present

## 2021-07-01 DIAGNOSIS — Z85820 Personal history of malignant melanoma of skin: Secondary | ICD-10-CM | POA: Diagnosis not present

## 2021-07-01 DIAGNOSIS — K219 Gastro-esophageal reflux disease without esophagitis: Secondary | ICD-10-CM | POA: Diagnosis not present

## 2021-07-01 DIAGNOSIS — Z85528 Personal history of other malignant neoplasm of kidney: Secondary | ICD-10-CM | POA: Diagnosis not present

## 2021-07-01 DIAGNOSIS — E7800 Pure hypercholesterolemia, unspecified: Secondary | ICD-10-CM | POA: Diagnosis not present

## 2021-07-01 DIAGNOSIS — K589 Irritable bowel syndrome without diarrhea: Secondary | ICD-10-CM | POA: Diagnosis not present

## 2021-07-01 DIAGNOSIS — E039 Hypothyroidism, unspecified: Secondary | ICD-10-CM | POA: Diagnosis not present

## 2021-07-01 DIAGNOSIS — G7000 Myasthenia gravis without (acute) exacerbation: Secondary | ICD-10-CM | POA: Diagnosis not present

## 2021-07-04 ENCOUNTER — Encounter: Payer: Self-pay | Admitting: Internal Medicine

## 2021-07-04 ENCOUNTER — Ambulatory Visit (INDEPENDENT_AMBULATORY_CARE_PROVIDER_SITE_OTHER): Payer: Medicare Other | Admitting: Internal Medicine

## 2021-07-04 VITALS — BP 122/86 | HR 77 | Temp 97.9°F | Resp 12 | Ht 64.0 in | Wt 224.0 lb

## 2021-07-04 DIAGNOSIS — I10 Essential (primary) hypertension: Secondary | ICD-10-CM | POA: Diagnosis not present

## 2021-07-04 DIAGNOSIS — E7800 Pure hypercholesterolemia, unspecified: Secondary | ICD-10-CM

## 2021-07-04 DIAGNOSIS — D696 Thrombocytopenia, unspecified: Secondary | ICD-10-CM

## 2021-07-04 DIAGNOSIS — Z9989 Dependence on other enabling machines and devices: Secondary | ICD-10-CM

## 2021-07-04 DIAGNOSIS — G4733 Obstructive sleep apnea (adult) (pediatric): Secondary | ICD-10-CM

## 2021-07-04 DIAGNOSIS — G7000 Myasthenia gravis without (acute) exacerbation: Secondary | ICD-10-CM

## 2021-07-04 DIAGNOSIS — I4891 Unspecified atrial fibrillation: Secondary | ICD-10-CM | POA: Diagnosis not present

## 2021-07-04 DIAGNOSIS — T451X5A Adverse effect of antineoplastic and immunosuppressive drugs, initial encounter: Secondary | ICD-10-CM

## 2021-07-04 DIAGNOSIS — C642 Malignant neoplasm of left kidney, except renal pelvis: Secondary | ICD-10-CM

## 2021-07-04 DIAGNOSIS — E039 Hypothyroidism, unspecified: Secondary | ICD-10-CM

## 2021-07-04 DIAGNOSIS — D4989 Neoplasm of unspecified behavior of other specified sites: Secondary | ICD-10-CM | POA: Diagnosis not present

## 2021-07-04 DIAGNOSIS — R739 Hyperglycemia, unspecified: Secondary | ICD-10-CM | POA: Diagnosis not present

## 2021-07-04 DIAGNOSIS — G40909 Epilepsy, unspecified, not intractable, without status epilepticus: Secondary | ICD-10-CM

## 2021-07-04 DIAGNOSIS — D6481 Anemia due to antineoplastic chemotherapy: Secondary | ICD-10-CM

## 2021-07-04 DIAGNOSIS — R296 Repeated falls: Secondary | ICD-10-CM

## 2021-07-04 DIAGNOSIS — F39 Unspecified mood [affective] disorder: Secondary | ICD-10-CM

## 2021-07-04 DIAGNOSIS — C649 Malignant neoplasm of unspecified kidney, except renal pelvis: Secondary | ICD-10-CM

## 2021-07-04 NOTE — Progress Notes (Signed)
Patient ID: Madison Burns, female   DOB: 09/04/1950, 71 y.o.   MRN: [id number]   Subjective:    Patient ID: Madison Burns, female    DOB: 1950-07-09, 71 y.o.   MRN: [id number]   Patient here for a scheduled follow up.   Chief Complaint  Patient presents with   Follow-up    78mof/u   Hypertension   Hyperlipidemia   .   HPI She is accompanied by her sister.  History obtained from both of them.  Saw [facility] recently 06/13/21 - f/u meningioma - recent MRI - only minimal change.  Did not feel surgical intervention warranted.  Has had a couple of falls recently. [facility] did not feel related to the meningioma.  Saw Dr OCorky Soxrecently -f/u afib and hypertension.  Stable.  No changes made.  Recommend continuing amiodarone and diltiazem.  Hold anticoagulation with recent falls, etc.  Saw Dr BErlene Quan4/4/23 - f/u renal cell CA. s/p successful cryoablation in 08/2019.  Recommended f/u MRI in one year.  Overall feels things are stable.  No chest pain.  Breathing stable.  Eating.  No abdominal pain reported.  Bowels moving.     Past Medical History:  Diagnosis Date   Can[place] of kidney [place] [id number]  Complication of anesthesia    Myasthenia gravis   GERD (gastroesophageal reflux disease)    History of seizure disorder    Hypercholesterolemia    Hypertension    no longer has it. wt loss   Hypothyroidism    Irritable bowel syndrome    Melanoma ([place] 2017   Melanoma on neck   [place]henia [place]is ([place] 2011   OSA on CPAP    [place]    Past Surgical History:  Procedure Laterality Date   [place] 2013   COLONOSCOPY WITH PROPOFOL N/A 06/30/2017   Procedure: COLONOSCOPY WITH PROPOFOL;  Surgeon: Toledo, TBenay Pike MD;  Location: [facility] ENDOSCOPY;  Service: Gastroenterology;  Laterality: N/A;   CYSTOSCOPY W/ URETERAL STENT PLACEMENT Left 08/17/2019   Procedure: CYSTOSCOPY WITH RETROGRADE PYELOGRAM/URETERAL STENT PLACEMENT;  Surgeon:  MAlexis Frock MD;  Location: [facility];  Service: Urology;  Laterality: Left;  30 MINS   ESOPHAGOGASTRODUODENOSCOPY (EGD) WITH PROPOFOL N/A 06/30/2017   Procedure: ESOPHAGOGASTRODUODENOSCOPY (EGD) WITH PROPOFOL;  Surgeon: Toledo, TBenay Pike MD;  Location: [facility] ENDOSCOPY;  Service: Gastroenterology;  Laterality: N/A;   IR RADIOLOGIST EVAL & MGMT  06/28/2019   IR RADIOLOGIST EVAL & MGMT  09/14/2019   IR RADIOLOGIST EVAL & MGMT  03/28/2020   IR RADIOLOGIST EVAL & MGMT  10/10/2020   RADIOFREQUENCY ABLATION Left 08/17/2019   Procedure: LEFT RENAL CRYO ABLATION;  Surgeon: MJacqulynn Cadet MD;  Location: [facility];  Service: Anesthesiology;  Laterality: Left;   TONSILLECTOMY  1959   Family History  Problem Relation Age of Onset   Colon cancer Maternal Grandfather        stomach/colon   Heart disease Father        myocardial infarction   Hyperlipidemia Father    Hyperlipidemia Sister    Diabetes Sister    Bone cancer Other        cousin   Alzheimer's disease Mother        maternal aunts   Skin cancer Mother    Myasthenia gravis Brother        ocular   Skin cancer Brother    Stroke Paternal Grandfather    Breast  cancer Neg Hx    Social History   Socioeconomic History   Marital status: Single    Spouse name: Not on file   Number of children: 0   Years of education: Special Ed   Highest education level: Not on file  Occupational History   Occupation: Unemployed  Tobacco Use   Smoking status: Never   Smokeless tobacco: Never  Vaping Use   Vaping Use: Never used  Substance and Sexual Activity   Alcohol use: No    Alcohol/week: 0.0 standard drinks   Drug use: No   Sexual activity: Not Currently  Other Topics Concern   Not on file  Social History Narrative   06/23/17 lives with sister, Madison Burns   Regular exercise-no   Caffeine Use-yes   Social Determinants of Health   Financial Resource Strain: Not on file  Food Insecurity: Not on file  Transportation Needs: Not on file  Physical  Activity: Not on file  Stress: Not on file  Social Connections: Not on file     Review of Systems  Constitutional:  Negative for appetite change and unexpected weight change.  HENT:  Negative for congestion and sinus pressure.   Respiratory:  Negative for cough and chest tightness.        Breathing stable.   Cardiovascular:  Negative for chest pain, palpitations and leg swelling.  Gastrointestinal:  Negative for abdominal pain, diarrhea, nausea and vomiting.  Genitourinary:  Negative for difficulty urinating and dysuria.  Musculoskeletal:  Negative for joint swelling and myalgias.  Skin:  Negative for color change and rash.  Neurological:  Negative for dizziness and headaches.  Psychiatric/Behavioral:  Negative for agitation and dysphoric mood.       Objective:     BP 122/86 (BP Location: Left Arm, Patient Position: Sitting, Cuff Size: Large)   Pulse 77   Temp 97.9 F (36.6 C) (Temporal)   Resp 12   Ht '5\' 4"'$  (1.626 m)   Wt 224 lb (101.6 kg)   SpO2 97%   BMI 38.45 kg/m  Wt Readings from Last 3 Encounters:  07/04/21 224 lb (101.6 kg)  05/21/21 209 lb (94.8 kg)  05/14/21 206 lb 6.4 oz (93.6 kg)    Physical Exam Vitals reviewed.  Constitutional:      General: She is not in acute distress.    Appearance: Normal appearance.  HENT:     Head: Normocephalic and atraumatic.     Right Ear: External ear normal.     Left Ear: External ear normal.  Eyes:     General: No scleral icterus.       Right eye: No discharge.        Left eye: No discharge.     Conjunctiva/sclera: Conjunctivae normal.  Neck:     Thyroid: No thyromegaly.  Cardiovascular:     Rate and Rhythm: Normal rate and regular rhythm.  Pulmonary:     Effort: No respiratory distress.     Breath sounds: Normal breath sounds. No wheezing.  Abdominal:     General: Bowel sounds are normal.     Palpations: Abdomen is soft.     Tenderness: There is no abdominal tenderness.  Musculoskeletal:        General: No  swelling or tenderness.     Cervical back: Neck supple. No tenderness.  Lymphadenopathy:     Cervical: No cervical adenopathy.  Skin:    Findings: No erythema or rash.  Neurological:     Mental Status: She is alert.  Psychiatric:        Mood and Affect: Mood normal.        Behavior: Behavior normal.     Outpatient Encounter Medications as of 07/04/2021  Medication Sig   acetaminophen (TYLENOL) 325 MG tablet Take 2 tablets (650 mg total) by mouth every 6 (six) hours as needed for mild pain (or Fever >/= 101).   amiodarone (PACERONE) 200 MG tablet Take 1 tablet (200 mg total) by mouth daily.   Ascorbic Acid (VITAMIN C) 1000 MG tablet Take 1,000 mg by mouth at bedtime.   B Complex-C (B-COMPLEX WITH VITAMIN C) tablet Take 1 tablet by mouth every evening.   benzonatate (TESSALON) 100 MG capsule TAKE 1 CAPSULE BY MOUTH TWICE DAILY AS NEEDED COUGH   Biotin 10000 MCG TABS Take 10,000 mcg by mouth daily at 2 PM. (1430)   Calcium-Phosphorus-Vitamin D (CITRACAL +D3 PO) Take 1 tablet by mouth daily.   diltiazem (TIAZAC) 180 MG 24 hr capsule Take by mouth.   DULoxetine (CYMBALTA) 30 MG capsule TAKE 1 CAPSULE BY MOUTH ONCE DAILY   folic acid (FOLVITE) 1 MG tablet TAKE 2 TABLETS BY MOUTH ONCE DAILY   Garlic 7035 MG CAPS Take 1,000 mg by mouth in the morning, at noon, and at bedtime. (0800, 1430, 2000)   HYDROcodone-acetaminophen (NORCO/VICODIN) 5-325 MG tablet Take 1 tablet by mouth every 4 (four) hours as needed for moderate pain.   levothyroxine (SYNTHROID) 75 MCG tablet TAKE 1 TABLET BY MOUTH ONCE DAILY ON AN EMPTY STOMACH. WAIT 30 MINUTES BEFORE TAKING OTHER MEDS.   methotrexate 2.5 MG tablet TAKE 4 TABLETS BY MOUTH ONCE A WEEK WITHFOLIC ACID   pantoprazole (PROTONIX) 40 MG tablet Take 40 mg by mouth daily.    Probiotic Product (PROBIOTIC MULTI-ENZYME PO) Take 1 capsule by mouth in the morning and at bedtime. Probiotic Multi-Enzyme Digestive Formula   pyridostigmine (MESTINON) 60 MG tablet TAKE 1  TABLET BY MOUTH 3 TIMES DAILY   risperiDONE (RISPERDAL) 1 MG tablet Take 1 mg by mouth at bedtime.    tolterodine (DETROL Burns) 4 MG 24 hr capsule TAKE 1 CAPSULE BY MOUTH ONCE EVERY MORNING   vitamin E 180 MG (400 UNITS) capsule Take 400 Units by mouth daily at 2 PM. 1430   No facility-administered encounter medications on file as of 07/04/2021.     Lab Results  Component Value Date   WBC 7.2 04/29/2021   HGB 13.5 04/29/2021   HCT 41.5 04/29/2021   PLT 174 04/29/2021   GLUCOSE 102 (H) 04/29/2021   CHOL 197 12/14/2018   TRIG 210.0 (H) 12/14/2018   HDL 36.80 (L) 12/14/2018   LDLDIRECT 122.0 12/14/2018   LDLCALC 110 (H) 08/06/2018   ALT 22 04/29/2021   AST 28 04/29/2021   NA 133 (L) 04/29/2021   K 3.6 04/29/2021   CL 99 04/29/2021   CREATININE 1.15 (H) 04/29/2021   BUN 23 04/29/2021   CO2 27 04/29/2021   TSH 4.334 04/07/2020   INR 1.0 10/23/2019   HGBA1C 5.4 12/14/2018    MR Brain W Wo Contrast  Result Date: 05/21/2021 CLINICAL DATA:  Provided history: Renal cell cancer, left. Thymoma. Thymoma, RCC, frequent falls. EXAM: MRI HEAD WITHOUT AND WITH CONTRAST TECHNIQUE: Multiplanar, multiecho pulse sequences of the brain and surrounding structures were obtained without and with intravenous contrast. CONTRAST:  56m GADAVIST GADOBUTROL 1 MMOL/ML IV SOLN COMPARISON:  Prior head CT examinations 02/27/2021 and earlier. Brain MRI 01/24/2010. FINDINGS: Mild intermittent motion degradation. Brain: Mild generalized cerebral  atrophy. Enhancing dural-based mass along the medial aspect of the anterior left tentorium/left petroclinoid ligament. There is superior extension along the inferomedial aspect of the left temporal lobe. The mass also extends inferiorly within the left ventrolateral aspect of the prepontine cistern. In retrospect, this mass was subtly present on the prior brain MRI of 01/24/2010, but is seen to much better advantage on today's contrast enhanced examination. The mass has only  mildly increased in size since the prior MRI, now measuring 1.4 x 0.9 x 1.4 cm. The imaging features and slow growth are most compatible with a meningioma. The mass abuts the posterior aspect of the left cavernous sinus without definite cavernous sinus invasion. Additionally, the mass abuts and appears to partially encase the left trigeminal nerve within the prepontine cistern and at the entrance to [employer]. Mild multifocal T2 FLAIR hyperintense signal abnormality within the cerebral white matter, nonspecific but compatible with chronic small vessel ischemic disease. "Empty" appearance of the sella turcica. There is no acute infarct. No chronic intracranial blood products. No extra-axial fluid collection. No midline shift. No pathologic intracranial enhancement identified elsewhere. Vascular: Maintained flow voids within the proximal large arterial vessels. Skull and upper cervical spine: No focal suspicious marrow lesion. Incompletely assessed cervical spondylosis. Sinuses/Orbits: Visualized orbits show no acute finding. Prior bilateral ocular lens replacement. No significant paranasal sinus disease. Impression #1 will be called to the ordering clinician or representative by the Radiologist Assistant, and communication documented in the PACS or [employer]. IMPRESSION: 1.4 x 1.4 cm dural-based mass centered along the medial aspect of the anterior left tentorium/left petroclinoid ligament, as described. This mass has only mildly increased in size from the prior brain MRI of 01/24/2010. The imaging features and slow growth are most compatible with a meningioma. The mass abuts, and appears to partially encase, the left trigeminal nerve within the prepontine cistern and at the entrance to [employer]. Additionally, the mass abuts the posterior aspect of the left cavernous sinus without definite cavernous sinus invasion. No evidence of intracranial metastatic disease. Mild chronic small vessel ischemic  changes within the cerebral white matter, progressed from the prior brain MRI of 01/24/2010. Mild generalized cerebral atrophy, also progressed. Electronically Signed   By: Kellie Simmering D.O.   On: 05/21/2021 19:08       Assessment & Plan:   Problem List Items Addressed This Visit     Anemia due to chemotherapy for renal cell cancer treated with erythropoietin ([place])    Followed by oncology.        Atrial fibrillation [facility])    Seeing cardiology.  On amiodarone and cardizem.  Appears to be in SR.  Follow.        Frequent falls    PT.         Hypercholesterolemia    Declines cholesterol medication.  Low cholesterol diet and exercise.  Follow lipid panel.        Relevant Orders   Lipid Profile   Hyperglycemia   Relevant Orders   HgB A1c   Hypertension - Primary    Blood pressure has been doing well. On oral cardizem.  Follow pressures and metabolic panel.        Relevant Orders   Basic Metabolic Panel (BMET)   Hepatic function panel   Hypothyroidism    On thyroid replacement.  Follow tsh.        Relevant Orders   TSH   Mood disorder ([place])    Followed by psychiatry.  Continue risperdal.  Myasthenia gravis ([place])    Followed by neurology.  Stable.        OSA on CPAP    Doing well.  Continue cpap.         Renal cell cancer, left [facility])    Just reevaluated by urology.  Stable. Recommended f/u MRI in one year.        Seizure disorder ([place])    Followed by neurology.         Thrombocytopenia ([place])    Followed by oncology.        Thymoma    Followed by oncology.  S/p chemo and XRT.           Einar Pheasant, MD

## 2021-07-08 ENCOUNTER — Telehealth: Payer: Self-pay | Admitting: *Deleted

## 2021-07-08 DIAGNOSIS — I10 Essential (primary) hypertension: Secondary | ICD-10-CM | POA: Diagnosis not present

## 2021-07-08 DIAGNOSIS — Z9181 History of falling: Secondary | ICD-10-CM | POA: Diagnosis not present

## 2021-07-08 DIAGNOSIS — E039 Hypothyroidism, unspecified: Secondary | ICD-10-CM | POA: Diagnosis not present

## 2021-07-08 DIAGNOSIS — C37 Malignant neoplasm of thymus: Secondary | ICD-10-CM | POA: Diagnosis not present

## 2021-07-08 DIAGNOSIS — G7000 Myasthenia gravis without (acute) exacerbation: Secondary | ICD-10-CM | POA: Diagnosis not present

## 2021-07-08 DIAGNOSIS — Z85528 Personal history of other malignant neoplasm of kidney: Secondary | ICD-10-CM | POA: Diagnosis not present

## 2021-07-08 DIAGNOSIS — Z85820 Personal history of malignant melanoma of skin: Secondary | ICD-10-CM | POA: Diagnosis not present

## 2021-07-08 DIAGNOSIS — D329 Benign neoplasm of meninges, unspecified: Secondary | ICD-10-CM | POA: Diagnosis not present

## 2021-07-08 DIAGNOSIS — K589 Irritable bowel syndrome without diarrhea: Secondary | ICD-10-CM | POA: Diagnosis not present

## 2021-07-08 DIAGNOSIS — G4733 Obstructive sleep apnea (adult) (pediatric): Secondary | ICD-10-CM | POA: Diagnosis not present

## 2021-07-08 DIAGNOSIS — E7800 Pure hypercholesterolemia, unspecified: Secondary | ICD-10-CM | POA: Diagnosis not present

## 2021-07-08 DIAGNOSIS — K219 Gastro-esophageal reflux disease without esophagitis: Secondary | ICD-10-CM | POA: Diagnosis not present

## 2021-07-08 NOTE — Telephone Encounter (Signed)
Patient had a fall at home on Saturday, Jul 06, 2021 at 4:30 am. No injuries were sustained. This is just an [place]. If you have any questions, call him '@[phone].

## 2021-07-09 ENCOUNTER — Encounter: Payer: Self-pay | Admitting: Oncology

## 2021-07-15 NOTE — Assessment & Plan Note (Signed)
Seeing cardiology.  On amiodarone and cardizem.  Appears to be in SR.  Follow.  

## 2021-07-15 NOTE — Assessment & Plan Note (Signed)
PT

## 2021-07-15 NOTE — Assessment & Plan Note (Signed)
Followed by psychiatry.  Continue risperdal.

## 2021-07-15 NOTE — Assessment & Plan Note (Signed)
Followed by neurology.  Stable  

## 2021-07-15 NOTE — Assessment & Plan Note (Signed)
On thyroid replacement.  Follow tsh.  

## 2021-07-15 NOTE — Assessment & Plan Note (Signed)
Just reevaluated by urology.  Stable. Recommended f/u MRI in one year.

## 2021-07-15 NOTE — Assessment & Plan Note (Signed)
Followed by oncology 

## 2021-07-15 NOTE — Assessment & Plan Note (Signed)
Blood pressure has been doing well. On oral cardizem.  Follow pressures and metabolic panel.  

## 2021-07-15 NOTE — Assessment & Plan Note (Signed)
Followed by oncology.  S/p chemo and XRT.

## 2021-07-15 NOTE — Assessment & Plan Note (Signed)
Doing well.  Continue cpap.

## 2021-07-15 NOTE — Assessment & Plan Note (Signed)
Declines cholesterol medication.  Low cholesterol diet and exercise.  Follow lipid panel.   

## 2021-07-15 NOTE — Assessment & Plan Note (Signed)
Followed by neurology.   

## 2021-07-16 DIAGNOSIS — G7000 Myasthenia gravis without (acute) exacerbation: Secondary | ICD-10-CM | POA: Diagnosis not present

## 2021-07-16 DIAGNOSIS — Z85528 Personal history of other malignant neoplasm of kidney: Secondary | ICD-10-CM | POA: Diagnosis not present

## 2021-07-16 DIAGNOSIS — C37 Malignant neoplasm of thymus: Secondary | ICD-10-CM | POA: Diagnosis not present

## 2021-07-16 DIAGNOSIS — E039 Hypothyroidism, unspecified: Secondary | ICD-10-CM | POA: Diagnosis not present

## 2021-07-16 DIAGNOSIS — K219 Gastro-esophageal reflux disease without esophagitis: Secondary | ICD-10-CM | POA: Diagnosis not present

## 2021-07-16 DIAGNOSIS — E7800 Pure hypercholesterolemia, unspecified: Secondary | ICD-10-CM | POA: Diagnosis not present

## 2021-07-16 DIAGNOSIS — K589 Irritable bowel syndrome without diarrhea: Secondary | ICD-10-CM | POA: Diagnosis not present

## 2021-07-16 DIAGNOSIS — Z9181 History of falling: Secondary | ICD-10-CM | POA: Diagnosis not present

## 2021-07-16 DIAGNOSIS — D329 Benign neoplasm of meninges, unspecified: Secondary | ICD-10-CM | POA: Diagnosis not present

## 2021-07-16 DIAGNOSIS — Z85820 Personal history of malignant melanoma of skin: Secondary | ICD-10-CM | POA: Diagnosis not present

## 2021-07-16 DIAGNOSIS — I10 Essential (primary) hypertension: Secondary | ICD-10-CM | POA: Diagnosis not present

## 2021-07-16 DIAGNOSIS — G4733 Obstructive sleep apnea (adult) (pediatric): Secondary | ICD-10-CM | POA: Diagnosis not present

## 2021-07-22 DIAGNOSIS — Z9181 History of falling: Secondary | ICD-10-CM | POA: Diagnosis not present

## 2021-07-22 DIAGNOSIS — Z85820 Personal history of malignant melanoma of skin: Secondary | ICD-10-CM | POA: Diagnosis not present

## 2021-07-22 DIAGNOSIS — E7800 Pure hypercholesterolemia, unspecified: Secondary | ICD-10-CM | POA: Diagnosis not present

## 2021-07-22 DIAGNOSIS — G7000 Myasthenia gravis without (acute) exacerbation: Secondary | ICD-10-CM | POA: Diagnosis not present

## 2021-07-22 DIAGNOSIS — K589 Irritable bowel syndrome without diarrhea: Secondary | ICD-10-CM | POA: Diagnosis not present

## 2021-07-22 DIAGNOSIS — I10 Essential (primary) hypertension: Secondary | ICD-10-CM | POA: Diagnosis not present

## 2021-07-22 DIAGNOSIS — C37 Malignant neoplasm of thymus: Secondary | ICD-10-CM | POA: Diagnosis not present

## 2021-07-22 DIAGNOSIS — K219 Gastro-esophageal reflux disease without esophagitis: Secondary | ICD-10-CM | POA: Diagnosis not present

## 2021-07-22 DIAGNOSIS — Z85528 Personal history of other malignant neoplasm of kidney: Secondary | ICD-10-CM | POA: Diagnosis not present

## 2021-07-22 DIAGNOSIS — E039 Hypothyroidism, unspecified: Secondary | ICD-10-CM | POA: Diagnosis not present

## 2021-07-22 DIAGNOSIS — D329 Benign neoplasm of meninges, unspecified: Secondary | ICD-10-CM | POA: Diagnosis not present

## 2021-07-22 DIAGNOSIS — G4733 Obstructive sleep apnea (adult) (pediatric): Secondary | ICD-10-CM | POA: Diagnosis not present

## 2021-07-25 ENCOUNTER — Other Ambulatory Visit (INDEPENDENT_AMBULATORY_CARE_PROVIDER_SITE_OTHER): Payer: Medicare Other

## 2021-07-25 ENCOUNTER — Other Ambulatory Visit: Payer: Self-pay | Admitting: Lab

## 2021-07-25 DIAGNOSIS — E039 Hypothyroidism, unspecified: Secondary | ICD-10-CM

## 2021-07-25 DIAGNOSIS — E7800 Pure hypercholesterolemia, unspecified: Secondary | ICD-10-CM

## 2021-07-25 DIAGNOSIS — R739 Hyperglycemia, unspecified: Secondary | ICD-10-CM

## 2021-07-25 DIAGNOSIS — I10 Essential (primary) hypertension: Secondary | ICD-10-CM | POA: Diagnosis not present

## 2021-07-25 LAB — LIPID PANEL
Cholesterol: 235 mg/dL — ABNORMAL HIGH (ref 0–200)
HDL: 59.8 mg/dL (ref >39.00)
LDL Cholesterol: 153 mg/dL — ABNORMAL HIGH (ref 0–99)
NonHDL: 175.22
Total CHOL/HDL Ratio: 4
Triglycerides: 110 mg/dL (ref 0.0–149.0)
VLDL: 22 mg/dL (ref 0.0–40.0)

## 2021-07-25 LAB — BASIC METABOLIC PANEL
BUN: 16 mg/dL (ref 6–23)
CO2: 30 mEq/L (ref 19–32)
Calcium: 9.1 mg/dL (ref 8.4–10.5)
Chloride: 104 mEq/L (ref 96–112)
Creatinine, Ser: 1.1 mg/dL (ref 0.40–1.20)
GFR: 51.05 mL/min — ABNORMAL LOW (ref >60.00)
Glucose, Bld: 86 mg/dL (ref 70–99)
Potassium: 4 mEq/L (ref 3.5–5.1)
Sodium: 141 mEq/L (ref 135–145)

## 2021-07-25 LAB — HEPATIC FUNCTION PANEL
ALT: 20 U/L (ref 0–35)
AST: 27 U/L (ref 0–37)
Albumin: 3.9 g/dL (ref 3.5–5.2)
Alkaline Phosphatase: 57 U/L (ref 39–117)
Bilirubin, Direct: 0.1 mg/dL (ref 0.0–0.3)
Total Bilirubin: 0.5 mg/dL (ref 0.2–1.2)
Total Protein: 6 g/dL (ref 6.0–8.3)

## 2021-07-25 LAB — TSH: TSH: 3.46 u[IU]/mL (ref 0.35–5.50)

## 2021-07-25 LAB — HEMOGLOBIN A1C: Hgb A1c MFr Bld: 5.5 % (ref 4.6–6.5)

## 2021-07-25 NOTE — Addendum Note (Signed)
Addended by: Leeanne Rio on: 07/25/2021 10:48 AM   Modules accepted: Orders

## 2021-07-29 ENCOUNTER — Other Ambulatory Visit: Payer: Self-pay | Admitting: Neurology

## 2021-07-29 DIAGNOSIS — Z85820 Personal history of malignant melanoma of skin: Secondary | ICD-10-CM | POA: Diagnosis not present

## 2021-07-29 DIAGNOSIS — G4733 Obstructive sleep apnea (adult) (pediatric): Secondary | ICD-10-CM

## 2021-07-29 DIAGNOSIS — K219 Gastro-esophageal reflux disease without esophagitis: Secondary | ICD-10-CM | POA: Diagnosis not present

## 2021-07-29 DIAGNOSIS — E7800 Pure hypercholesterolemia, unspecified: Secondary | ICD-10-CM | POA: Diagnosis not present

## 2021-07-29 DIAGNOSIS — E039 Hypothyroidism, unspecified: Secondary | ICD-10-CM | POA: Diagnosis not present

## 2021-07-29 DIAGNOSIS — G7000 Myasthenia gravis without (acute) exacerbation: Secondary | ICD-10-CM | POA: Diagnosis not present

## 2021-07-29 DIAGNOSIS — Z85528 Personal history of other malignant neoplasm of kidney: Secondary | ICD-10-CM | POA: Diagnosis not present

## 2021-07-29 DIAGNOSIS — Z9181 History of falling: Secondary | ICD-10-CM | POA: Diagnosis not present

## 2021-07-29 DIAGNOSIS — D329 Benign neoplasm of meninges, unspecified: Secondary | ICD-10-CM | POA: Diagnosis not present

## 2021-07-29 DIAGNOSIS — K589 Irritable bowel syndrome without diarrhea: Secondary | ICD-10-CM | POA: Diagnosis not present

## 2021-07-29 DIAGNOSIS — C37 Malignant neoplasm of thymus: Secondary | ICD-10-CM | POA: Diagnosis not present

## 2021-07-29 DIAGNOSIS — G40309 Generalized idiopathic epilepsy and epileptic syndromes, not intractable, without status epilepticus: Secondary | ICD-10-CM

## 2021-07-29 DIAGNOSIS — I10 Essential (primary) hypertension: Secondary | ICD-10-CM | POA: Diagnosis not present

## 2021-07-29 DIAGNOSIS — F73 Profound intellectual disabilities: Secondary | ICD-10-CM

## 2021-08-03 ENCOUNTER — Other Ambulatory Visit: Payer: Self-pay | Admitting: Nurse Practitioner

## 2021-08-05 DIAGNOSIS — Z85528 Personal history of other malignant neoplasm of kidney: Secondary | ICD-10-CM | POA: Diagnosis not present

## 2021-08-05 DIAGNOSIS — G4733 Obstructive sleep apnea (adult) (pediatric): Secondary | ICD-10-CM | POA: Diagnosis not present

## 2021-08-05 DIAGNOSIS — K219 Gastro-esophageal reflux disease without esophagitis: Secondary | ICD-10-CM | POA: Diagnosis not present

## 2021-08-05 DIAGNOSIS — E7800 Pure hypercholesterolemia, unspecified: Secondary | ICD-10-CM | POA: Diagnosis not present

## 2021-08-05 DIAGNOSIS — Z85820 Personal history of malignant melanoma of skin: Secondary | ICD-10-CM | POA: Diagnosis not present

## 2021-08-05 DIAGNOSIS — K589 Irritable bowel syndrome without diarrhea: Secondary | ICD-10-CM | POA: Diagnosis not present

## 2021-08-05 DIAGNOSIS — E039 Hypothyroidism, unspecified: Secondary | ICD-10-CM | POA: Diagnosis not present

## 2021-08-05 DIAGNOSIS — C37 Malignant neoplasm of thymus: Secondary | ICD-10-CM | POA: Diagnosis not present

## 2021-08-05 DIAGNOSIS — Z9181 History of falling: Secondary | ICD-10-CM | POA: Diagnosis not present

## 2021-08-05 DIAGNOSIS — G7000 Myasthenia gravis without (acute) exacerbation: Secondary | ICD-10-CM | POA: Diagnosis not present

## 2021-08-05 DIAGNOSIS — D329 Benign neoplasm of meninges, unspecified: Secondary | ICD-10-CM | POA: Diagnosis not present

## 2021-08-05 DIAGNOSIS — I10 Essential (primary) hypertension: Secondary | ICD-10-CM | POA: Diagnosis not present

## 2021-08-07 DIAGNOSIS — H43813 Vitreous degeneration, bilateral: Secondary | ICD-10-CM | POA: Diagnosis not present

## 2021-08-07 DIAGNOSIS — Z961 Presence of intraocular lens: Secondary | ICD-10-CM | POA: Diagnosis not present

## 2021-08-07 DIAGNOSIS — G7000 Myasthenia gravis without (acute) exacerbation: Secondary | ICD-10-CM | POA: Diagnosis not present

## 2021-08-12 DIAGNOSIS — C37 Malignant neoplasm of thymus: Secondary | ICD-10-CM | POA: Diagnosis not present

## 2021-08-12 DIAGNOSIS — K589 Irritable bowel syndrome without diarrhea: Secondary | ICD-10-CM | POA: Diagnosis not present

## 2021-08-12 DIAGNOSIS — Z85820 Personal history of malignant melanoma of skin: Secondary | ICD-10-CM | POA: Diagnosis not present

## 2021-08-12 DIAGNOSIS — G4733 Obstructive sleep apnea (adult) (pediatric): Secondary | ICD-10-CM | POA: Diagnosis not present

## 2021-08-12 DIAGNOSIS — Z85528 Personal history of other malignant neoplasm of kidney: Secondary | ICD-10-CM | POA: Diagnosis not present

## 2021-08-12 DIAGNOSIS — D329 Benign neoplasm of meninges, unspecified: Secondary | ICD-10-CM | POA: Diagnosis not present

## 2021-08-12 DIAGNOSIS — E7800 Pure hypercholesterolemia, unspecified: Secondary | ICD-10-CM | POA: Diagnosis not present

## 2021-08-12 DIAGNOSIS — Z9181 History of falling: Secondary | ICD-10-CM | POA: Diagnosis not present

## 2021-08-12 DIAGNOSIS — K219 Gastro-esophageal reflux disease without esophagitis: Secondary | ICD-10-CM | POA: Diagnosis not present

## 2021-08-12 DIAGNOSIS — I10 Essential (primary) hypertension: Secondary | ICD-10-CM | POA: Diagnosis not present

## 2021-08-12 DIAGNOSIS — E039 Hypothyroidism, unspecified: Secondary | ICD-10-CM | POA: Diagnosis not present

## 2021-08-12 DIAGNOSIS — G7000 Myasthenia gravis without (acute) exacerbation: Secondary | ICD-10-CM | POA: Diagnosis not present

## 2021-08-22 DIAGNOSIS — Z9181 History of falling: Secondary | ICD-10-CM | POA: Diagnosis not present

## 2021-08-22 DIAGNOSIS — K219 Gastro-esophageal reflux disease without esophagitis: Secondary | ICD-10-CM | POA: Diagnosis not present

## 2021-08-22 DIAGNOSIS — I10 Essential (primary) hypertension: Secondary | ICD-10-CM | POA: Diagnosis not present

## 2021-08-22 DIAGNOSIS — D329 Benign neoplasm of meninges, unspecified: Secondary | ICD-10-CM | POA: Diagnosis not present

## 2021-08-22 DIAGNOSIS — C37 Malignant neoplasm of thymus: Secondary | ICD-10-CM | POA: Diagnosis not present

## 2021-08-22 DIAGNOSIS — E039 Hypothyroidism, unspecified: Secondary | ICD-10-CM | POA: Diagnosis not present

## 2021-08-22 DIAGNOSIS — G4733 Obstructive sleep apnea (adult) (pediatric): Secondary | ICD-10-CM | POA: Diagnosis not present

## 2021-08-22 DIAGNOSIS — G7000 Myasthenia gravis without (acute) exacerbation: Secondary | ICD-10-CM | POA: Diagnosis not present

## 2021-08-22 DIAGNOSIS — Z85528 Personal history of other malignant neoplasm of kidney: Secondary | ICD-10-CM | POA: Diagnosis not present

## 2021-08-22 DIAGNOSIS — K589 Irritable bowel syndrome without diarrhea: Secondary | ICD-10-CM | POA: Diagnosis not present

## 2021-08-22 DIAGNOSIS — Z85820 Personal history of malignant melanoma of skin: Secondary | ICD-10-CM | POA: Diagnosis not present

## 2021-08-22 DIAGNOSIS — E7800 Pure hypercholesterolemia, unspecified: Secondary | ICD-10-CM | POA: Diagnosis not present

## 2021-08-26 DIAGNOSIS — Z85820 Personal history of malignant melanoma of skin: Secondary | ICD-10-CM | POA: Diagnosis not present

## 2021-08-26 DIAGNOSIS — G4733 Obstructive sleep apnea (adult) (pediatric): Secondary | ICD-10-CM | POA: Diagnosis not present

## 2021-08-26 DIAGNOSIS — K589 Irritable bowel syndrome without diarrhea: Secondary | ICD-10-CM | POA: Diagnosis not present

## 2021-08-26 DIAGNOSIS — C37 Malignant neoplasm of thymus: Secondary | ICD-10-CM | POA: Diagnosis not present

## 2021-08-26 DIAGNOSIS — E7800 Pure hypercholesterolemia, unspecified: Secondary | ICD-10-CM | POA: Diagnosis not present

## 2021-08-26 DIAGNOSIS — D329 Benign neoplasm of meninges, unspecified: Secondary | ICD-10-CM | POA: Diagnosis not present

## 2021-08-26 DIAGNOSIS — K219 Gastro-esophageal reflux disease without esophagitis: Secondary | ICD-10-CM | POA: Diagnosis not present

## 2021-08-26 DIAGNOSIS — E039 Hypothyroidism, unspecified: Secondary | ICD-10-CM | POA: Diagnosis not present

## 2021-08-26 DIAGNOSIS — Z85528 Personal history of other malignant neoplasm of kidney: Secondary | ICD-10-CM | POA: Diagnosis not present

## 2021-08-26 DIAGNOSIS — I10 Essential (primary) hypertension: Secondary | ICD-10-CM | POA: Diagnosis not present

## 2021-08-26 DIAGNOSIS — G7000 Myasthenia gravis without (acute) exacerbation: Secondary | ICD-10-CM | POA: Diagnosis not present

## 2021-08-26 DIAGNOSIS — Z9181 History of falling: Secondary | ICD-10-CM | POA: Diagnosis not present

## 2021-09-03 ENCOUNTER — Other Ambulatory Visit: Payer: Self-pay | Admitting: Interventional Radiology

## 2021-09-03 DIAGNOSIS — N2889 Other specified disorders of kidney and ureter: Secondary | ICD-10-CM

## 2021-09-04 ENCOUNTER — Ambulatory Visit (INDEPENDENT_AMBULATORY_CARE_PROVIDER_SITE_OTHER): Payer: Medicare Other | Admitting: Neurology

## 2021-09-04 ENCOUNTER — Encounter: Payer: Self-pay | Admitting: Neurology

## 2021-09-04 VITALS — BP 123/79 | HR 81 | Ht 64.0 in | Wt 214.0 lb

## 2021-09-04 DIAGNOSIS — G4733 Obstructive sleep apnea (adult) (pediatric): Secondary | ICD-10-CM | POA: Diagnosis not present

## 2021-09-04 DIAGNOSIS — D696 Thrombocytopenia, unspecified: Secondary | ICD-10-CM | POA: Diagnosis not present

## 2021-09-04 DIAGNOSIS — J9601 Acute respiratory failure with hypoxia: Secondary | ICD-10-CM

## 2021-09-04 DIAGNOSIS — I480 Paroxysmal atrial fibrillation: Secondary | ICD-10-CM | POA: Diagnosis not present

## 2021-09-04 DIAGNOSIS — C37 Malignant neoplasm of thymus: Secondary | ICD-10-CM

## 2021-09-04 DIAGNOSIS — J9602 Acute respiratory failure with hypercapnia: Secondary | ICD-10-CM | POA: Diagnosis not present

## 2021-09-04 DIAGNOSIS — Z9989 Dependence on other enabling machines and devices: Secondary | ICD-10-CM | POA: Diagnosis not present

## 2021-09-04 DIAGNOSIS — C642 Malignant neoplasm of left kidney, except renal pelvis: Secondary | ICD-10-CM | POA: Diagnosis not present

## 2021-09-04 DIAGNOSIS — F73 Profound intellectual disabilities: Secondary | ICD-10-CM | POA: Diagnosis not present

## 2021-09-04 DIAGNOSIS — G40309 Generalized idiopathic epilepsy and epileptic syndromes, not intractable, without status epilepticus: Secondary | ICD-10-CM

## 2021-09-04 DIAGNOSIS — G7000 Myasthenia gravis without (acute) exacerbation: Secondary | ICD-10-CM | POA: Diagnosis not present

## 2021-09-04 DIAGNOSIS — G4719 Other hypersomnia: Secondary | ICD-10-CM

## 2021-09-04 MED ORDER — DESMOPRESSIN ACETATE SPRAY 0.01 % NA SOLN: 10.0000 ug | Freq: Two times a day (BID) | NASAL | 12 refills | Status: AC

## 2021-09-04 NOTE — Patient Instructions (Addendum)
Patient Education  [place] again at 14/18, was higher since she had been in cancer treatment. Now on risperdol she sleeps more hours. Also sleepy in daytime.  FSS at 47/ 63  points   Myasthenia, orthopenia, had a ED visit. Has contracted Pneumonia, 03-21-2020. Last years HST was negativ for apnea- atrial  fibrillation, on amiodarone.  More choking, dysphagia, dysphonia. Started to snore again- witnessed by her sister nancy- will  ask for in lab sleep study.  Her enuresis happens at 4-5 Am and we may have to start waking he to go to the bathroom. Wears a pull up in night time. Bed padding needed.    Diurnal enuresis (wetting during the day) Nocturnal enuresis (wetting during the night) Primary enuresis (occurs when the patient has never fully mastered toilet training) Secondary enuresis (occurs when the person did have a period of dryness, but then returned to having periods of incontinence). Overview. Bed-wetting -- also called nighttime incontinence or nocturnal enuresis -- is involuntary urination while asleep after the age at which staying dry at night can be reasonably expected.  Desmopressin nasal spray at night.   Desmopressin nose spray What is this medication? DESMOPRESSIN (des moe PRESS in) is a man made hormone. It helps to control urination and thirst. This medicine is used to treat central diabetes insipidus. It is used in patients after a head injury or some surgeries. This medicine is used to treat or prevent bleeding in patients with Hemophilia A and von Willebrand's Disease. This medicine is also used to treat adults with nocturia. This medicine may be used for other purposes; ask your health care provider or pharmacist if you have questions. COMMON BRAND NAME(S): DDAVP, Minirin, Stimate What should I tell my care team before I take this medication? They need to know if you have any of these conditions: blood clot in the past congestion, injury, swelling of  the nose cystic fibrosis heart disease high blood pressure kidney disease low levels of sodium in the blood taking a loop diuretic or steroid medicine an unusual or allergic reaction to desmopressin, vasopressin, other medicines, foods, dyes, or preservatives pregnant or trying to get pregnant breast-feeding How should I use this medication? This medicine is for use in the nose. Follow the directions on the prescription label. Take your medicine at regular intervals. Do not take your medicine more often than directed. Make sure that you are using your nose spray correctly. Do not use any other products in the nose. Ask your doctor or health care provider if you have any questions. If you are using Noctiva nasal spray, a special MedGuide will be given to you by the pharmacist with each prescription and refill. Be sure to read this information carefully each time. Talk to your pediatrician regarding the use of this medicine in children. While this drug may be prescribed for selected conditions, precautions do apply. Overdosage: If you think you have taken too much of this medicine contact a poison control center or emergency room at once. NOTE: This medicine is only for you. Do not share this medicine with others. What if I miss a dose? If you miss a dose and it is almost time for your next dose, take only that dose. Do not take double or extra doses. What may interact with this medication? Do not take this medicine with any of the following medications: loop diuretics steroid medicines like prednisone or cortisone This medicine may also interact with the following medications: certain medicines  for depression, anxiety, or psychotic disturbances certain medicines for seizures like carbamazepine, lamotrigine narcotic medicines for pain NSAIDS, medicines for pain and inflammation, like ibuprofen or naproxen This list may not describe all possible interactions. Give your health care provider a  list of all the medicines, herbs, non-prescription drugs, or dietary supplements you use. Also tell them if you smoke, drink alcohol, or use illegal drugs. Some items may interact with your medicine. What should I watch for while using this medication? Visit your doctor or health care professional for regular check ups. You may need to get some lab tests done while taking this medicine. Talk with your doctor about how many glasses of water or other fluids you need to drink a day. Only drink enough fluid to satisfy your thirst or as directed. Too much or not enough water can cause harm. Stop using this medicine and call your doctor or health care professional for advice if you get sick and have a stomach or intestinal virus with vomiting or diarrhea or if you have an infection or fever. What side effects may I notice from receiving this medication? Side effects that you should report to your doctor or health care professional as soon as possible: allergic reactions like skin rash, itching or hives, swelling of the face, lips, or tongue breathing problems chest pain, tightness change in blood pressure fast, irregular heart rate signs and symptoms of low sodium like headache; drowsiness; confusion; nausea and vomiting; muscle cramps; restless; or seizures sudden weight gain swelling of the legs or ankles unusual bleeding, bruising unusually weak or tired Side effects that usually do not require medical attention (report to your doctor or health care professional if they continue or are bothersome): flushing, reddening of the skin headache nasal discomfort nosebleed runny or stuffy nose sneezing This list may not describe all possible side effects. Call your doctor for medical advice about side effects. You may report side effects to FDA at 1-800-FDA-1088. Where should I keep my medication? Keep out of the reach of children. DDAVP Nasal Spray: Store at room temperature, between 20 and 25  degrees C (68 and 77 degrees F). Store bottle in upright position. Keep track of the number of doses that you use. Throw away the bottle when you have used the number of doses on the bottle label. Do not transfer any medicine into another bottle. Throw away any unused medicine after the expiration date. Stimate Nasal Spray: Store in the refrigerator, between 4 and 8 degrees C (36 and 46 degrees F). When traveling, you can store at room temperature, 22 degrees C (72 degrees F) for up to 3 weeks. Keep track of the number of doses that you use. Throw away the bottle when you have used the number of doses on the bottle label. Do not transfer any medicine into another bottle. Throw away any unused medicine after the expiration date. Noctiva Nasal Spray: Before opening, store upright in a refrigerator between 0 and 15 degrees C (32 and 59 degrees F). After opening, store upright at room temperature between 20 and 25 degrees C (68 and 77 degrees F). Throw away 60 days after opening. NOTE: This sheet is a summary. It may not cover all possible information. If you have questions about this medicine, talk to your doctor, pharmacist, or health care provider.  2023 Elsevier/Gold Standard (2015-05-10 00:00:00)

## 2021-09-04 NOTE — Progress Notes (Signed)
PATIENT: Madison Burns DOB: [birth year]  REASON FOR VISIT: follow up HISTORY FROM: patient  Chief Complaint  Patient presents with   Follow-up    Rm 10, w sister Madison Burns. Here for 6 month MG and sleep f/u. Pt continues to snore and has started to wet the bed ever since her last fall, 2/19. Pt is such a light sleeper, currently taking risperidone.      HISTORY OF PRESENT ILLNESS: Today 09/04/21 Madison Burns is a 71 y.o. female with mental retardation, living with her sister.Here for 6 month MG and sleep f/u. Pt continues to snore and has started to wet the bed ever since her last fall, 2/19. Pt is such a light sleeper, currently taking risperidone. Had 3 falls in January and since has been wetting the bed.  [name] has been diagnosed with renal cell cancer and began having enuresis- her Myasthenia has taken a turn for the worse. She is presenting with ptosis , neck weakness and left shoulder slump. She is no longer able to walk unassisted, presents stooped. Her voice is hoarse. Her mood is good, she lost weight , BMI 36.73 kg/m2.  She completed her cancer treatments. She brought me another drawing- I have a collection of the teddy brea drawings since 15 years ago. She is  awaiting a CT of the kidney or MRI.    Her screening for apnea by HST on 10-05-2020 was negative: Calculated pAHI (per hour):   The calculated AHI was only 3.5/h there was no breakdown in rem sleep and non-REM sleep possible.  No data of sleep position or snoring were provided either.                                                                 Oxygen Saturation Statistics:       O2 Saturation Range (%):   Oxygen saturation ranged between a nadir of 74% and a maximum saturation of 97% with a mean oxygenation at 93% SPO2. Time spent under 89% oxygen saturation amounted to 5.2 minutes or 1.4% of sleep time.   I Burns for [name] to have an in lab study- she has new findings ENURESIS< and she has lost weight, and she  snores as witnessed by her sister- yet the HST did not reflect this.    08-22-2020: She is able to raise her voice and I can understand her quite clearly in spite of the mask.  She was hospitalized in February of this year and discharged from the hospital fairly good.  Aero care I had such her up on 03/2015 so she would be due for new machine this year.  The current machine no longer connects and last data were recorded in April and then stopped so I need to rather soon replaces since she has myasthenia if not we will make sense for her to have a repeat sleep study I want to make sure that she does not need a BiPAP instead of her CPAP at this time.  She has reportedly been snoring even when using her machine there is a baby monitor in her room and her sister can hear her snoring this way.  The patient's Epworth sleepiness score is endorsed at 14 points and the fatigue severity score at  58 points of both have increased which can also be a side effect of her malignancy.  Her current compliance from April is excellent 100% of days the machine was used and 79% over 4 hours with a residual AHI of 1.8 she only used 5 cm water pressure with 3 cm EPR due to a mostly upper airway resistance syndrome at the time that she was set up.  She is sleeping in a hospital bed with elevated head.         She was last seen by [name] , NP on 09-06-2019.  She also has developed wheezing in the meantime but she last saw Amy she presented much less impaired.  In the meantime which she had called our office.  She has completed her chemoradiation therapy and she is certainly has a weaker immune system she also developed pneumonia and sepsis following her cancer treatment and surgery.  She was seen at [facility] room just 10 days ago on 9-17 21 with breathing difficulties shortness of breath thought to be due to left-sided pneumonia and possible myasthenia gravis exacerbation.  She underwent  treatment with antibiotics and IVIG and ultimately was discharged after week.  She gets short of breath now with exertion still, sometimes she is wheezing. She is sleeping in a hospital bed, and she cannot breath  easily- has orthopnea. This bed is adjustable and helps her shortness of breath.   Since the patient was discharged from the hospital she has resumed compliant use of her CPAP and would now be 100% for the time since discharge.  Average use at time 9 hours 2 minutes, she is above average sleepy the CPAP is set to 5 cmH2O with 3 cm EPR and her AHI is 2.2 which is an excellent resolution of this otherwise mild apnea which was more classified as a high apnea.  I am worried that she is so weak at this time that she may not participate well in physical therapy or gait stabilization but I will wait if she has regained some of her strength and respiratory capacity in the next 3 months so that we could do some physical therapy as well.      1-20-2021CD Mandie has been on medication for myasthenia gravis, she had lost some weight since eating mostly at home during the pandemic.  She still has a good appetite no trouble swallowing she had no falls and no changes in gait.  No taking any seizure medicine, she has continued Mestinon and methotrexate for myasthenia gravis and it was felt that she was fairly stable.   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 This would be her third tumor.   IMPRESSION: 1. Apparent subtle 3.2 cm homogeneously hypoenhancing lesion in the posterior interpolar left kidney. Imaging features raise concern for papillary type renal cell carcinoma. Abdominal MRI with and  without contrast recommended to further evaluate. 2. Bilateral adrenal nodules. These could also be further assessed at the time of follow-up MRI. 3. No specific findings to explain the patient's history of periumbilical pain. 4. Tiny gas bubble identified in the urinary bladder, presumably  from recent instrumentation. In the absence of instrumentation, infection would be a consideration. 5. Insert [name] 6. Left colonic diverticulosis without diverticulitis.  She has been followed here by me for OSA- for obstructive sleep apnea and has been a very compliant CPAP user V visit for sleep apnea is not to do until September 2021 with [name].    [name], 11-03-2018 here today for follow up of MG, seizure, and OSA on CPAP. She is here today with her sister , Madison Burns who aids in history. She continues mestinon and methotrexate for MG and feels symptoms are fairly stable. No vision changes, no double vision. No new or worsening weakness. She has lost some weight as they are eating at home more since pandemic. She has a good appetite. No trouble swallowing. No changes in gait or falls. She is able to perform ADL's. She lives with her sister. She has not taken seizure medication in many years with no seizure activity noted. No adverse effects or worsening symptoms with decreased dose of methotrexate.   Compliance report dated 10/03/2018 through 11/01/2018 reveals that she is using CPAP nightly for compliance 100%.  She is using CPAP greater than 4 hours every night.  Average usage is 9 hours and 57 minutes.  AHI is 1.9 on 5 cm of water.  There is no significant leak.  HISTORY: (copied from [employer] note on 01/05/2018)  HISTORY CDRebecca H Thelen is a 71 y.o. female seen here as a referral from Dr. Nicki Reaper for Myasthenia gravis, per special request of the patient's sister .   Mrs. Wilmore carries a diagnosis of myasthenia gravis associated with difficulties swallowing, with a decreased level of  energy a high degree of fatigue sometimes best interest in activities facial droop eyelid droop sleepiness snoring easy bruising, coughing, trouble swallowing she has a mild hoarseness but not as significant dysphonia and incontinence.  Her family and the patient looking for a maintenance neurologist to keep her myasthenia under control. The patient has been diagnosed and treated by Dr Carlis Abbott in [place]- until his death.She has been followed by Dr. Melrose Nakayama at the [facility] clinic after following Dr. Paulla Dolly for 18 month .  The patient was diagnosed in 2011 by dr Carlis Abbott-  with myasthenia gravis. She was hospitalized with myasthnic crisis Jan 27 2010- through Januery 14th 2012. She was send to rehabilitation after that, by 03-2010. [name]  Her sister noticed a decline in cognitive skills at that time.  She has noted some core weakness- MG , no ptosis, no double vision.    She  is sleeping 12 hours easily per day, from 10 Pm to 8 AM. She has a droopy right eye.  She missed frequently the 3rd. Mestinon.  She has been followed by Dr. Melrose Nakayama at the [facility] clinic after following Dr. Paulla Dolly .  Again,  her first treatment had been prednisone and this was just weaned off by September 2015. The patient carries a diagnosis of hypercholesterolemia. She also has a past surgical history of appendectomy in 1999, hysterectomy in 2004 and the melanoma was removed in 2014 from the right side of the neck close to the TM joint.   Ms. Brunelle is single without children,  Lives with her sister now used to live with her mother. She is MRDD, she left school in 7th grade, went to a special facility until her GED, [employer]. She used to work at a [employer] ,later in a vocational trade.    Ms.  Nierman is taking methotrexate to control them myasthenia she is also on Mestinon 60 mg 3 times a day she takes Detrol for urinary continence at 4 mg in the morning and she takes Risperdal 2 mg I mouth at bedtime. She is also treated for  hypothyroidism with levothyroxine at 50 g daily. The methotrexate is given at 8 tablets once a week. Until last September 2015 the patient had been on prednisone. She has not had labs drawn for the last 5 month, while continuing on MTX.   4/19/17Today's visit is solely dedicated to the sleep disorder part of her neurological care   Mrs. Palazzi underwent a sleep apnea test as a home sleep test, the AHI was 20.7 the lowest desaturation was 61 with 309 minutes of desaturation. This of course a possibility that in a home sleep test the hypoxemia is overestimated or artifact related but she does have a significant amount of obstructive sleep apnea as well. For this reason and because of her underlying condition of myasthenia gravis the patient was asked to undergo an attended CPAP titration. This took place on 04-19-14 the AHI was 0.3 on CPAP of only 5 cm water. Her SPO2 nadir rose to 91% no added oxygen was needed. I prescribed a ResMed air-fluid P 10 in medium size and an auto CPAP from 4 through 8 cm water. The patient states that she has not received CPAP machine that she had an outstanding bill was advanced home care which may be the reason.  She is on Medicare/ Medicaid  and a sleep study cannot be older than 6 months to allow her to obtain a machine - so I will have to repeat her sleep study. Her Epworth sleepiness score remains high at 17 points, her depression score at 3.5, fatigue severity questionnaire at 38 points. MRDD - needs assistence with CPAP, certainly would need a desensitization session with [facility] in [place].      UPDATE 10/19/2017CM Ms. Cadiente, 71 year old female returns for follow-up with her mother. She has a history of obstructive sleep apnea on CPAP compliance report today is 100% for 30 days average usage 10 hours 6 minutes AHI is 1.1. No leaks. In addition she has myasthenia gravis and is currently on Mestinon and methotrexate. She needs refills. No recent falls. She has no facial  weakness no trouble swallowing. She also has a history of seizure disorder but has not had seizures in many years and has been weaned off of her seizure medication. She returns for reevaluation   02/23/17 CD I have the pleasure of following Ms. Mondry for her obstructive sleep apnea, and she has again proven to be a highly compliant CPAP user.  Her CPAP compliance is 93% with 9 hours and 9 minutes on average daily use.  CPAP is set at only 5 cm water pressure and allows for reduction of the AHI to 0.6.  No central apneas are emerging and she has very few air leaks. Her baseline AHI in 02-2015 was 20.8/hr.  "She reports dreaming more- good dreams". She sleeps well. She has trouble with swallowing solid foods.  In addition she has not had significant muscle weakness, no falls, but she does feel easily fatigued and sleepy.  She endorsed the Epworth score today on 11 and the fatigue severity at 54 points. 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 She often cried during this task.   UPDATE 11/19/2019CM Ms. Deblasi, 71 year old female returns for  follow-up with history of obstructive sleep apnea on CPAP.  She also has a history of myasthenia gravis and seizure disorder.  She is no longer on seizure medication and no seizures in many years.  CPAP compliance dated 12/05/2017-01/03/2018 shows compliance greater than 4 hours at 100%.  Set pressure 5 cm.  Average usage 10 hours 29 minutes.  No leak.  AHI 1.3.  She remains on Mestinon and methotrexate for her myasthenia gravis which has been stable.  She complains of intermittent ptosis of the right eye when fatigued.  This is not evident today.  She has no double vision.  Appetite is good and she sleeps well she returns for reevaluation   REVIEW OF SYSTEMS: Out of a complete 14 system review of symptoms, the patient complains only of the following symptoms, none and all other reviewed systems are negative.   SOB, stooped, head  drop on the chest, ptosis. Fatigue.  thymoma, malignant, renal cancer,  Mediastinal LN swelling and renal pole cancer , l has had chemotherapy and radiation, sepsis and pneumonia. left kidney  Still present the tumor was " frozen". .  I Burns for Lasya to have an in lab study- she has new findings ENURESIS< and she has lost weight, and she snores as witnessed by her sister- yet the HST did not reflect this.     How likely are you to doze in the following situations: 0 = not likely, 1 = slight chance, 2 = moderate chance, 3 = high chance  Sitting and Reading? Watching Television? Sitting inactive in a public place (theater or meeting)? Lying down in the afternoon when circumstances permit? Sitting and talking to someone? As a passenger in a car for an hour without a break?  Total = again at 14/18, was higher since she had been in cancer treatment. Now on risperdol she sleeps more hours. Also sleepy in daytime.  FSS at 47/ 63  points   Myasthenia, orthopenia, had a ED visit. Has contracted Pneumonia, 03-21-2020. Last years HST was negativ for apnea- atrial  fibrillation, on amiodarone.  More choking, dysphagia, dysphonia. Started to snore again- witnessed by her sister nancy- will  ask for in lab sleep study.  Her enuresis happens at 4-5 Am and we may have to start waking he to go to the bathroom. Wears a pull up in night time. Bed padding needed.     ALLERGIES: No Known Allergies  HOME MEDICATIONS: Outpatient Medications Prior to Visit  Medication Sig Dispense Refill   acetaminophen (TYLENOL) 325 MG tablet Take 2 tablets (650 mg total) by mouth every 6 (six) hours as needed for mild pain (or Fever >/= 101).     amiodarone (PACERONE) 200 MG tablet Take 1 tablet (200 mg total) by mouth daily. 14 tablet 0   Ascorbic Acid (VITAMIN C) 1000 MG tablet Take 1,000 mg by mouth at bedtime.     B Complex-C (B-COMPLEX WITH VITAMIN C) tablet Take 1 tablet by mouth every evening.     benzonatate  (TESSALON) 100 MG capsule TAKE 1 CAPSULE BY MOUTH TWICE DAILY AS NEEDED COUGH 90 capsule 0   Biotin 10000 MCG TABS Take 10,000 mcg by mouth daily at 2 PM. (1430)     Calcium-Phosphorus-Vitamin D (CITRACAL +D3 PO) Take 1 tablet by mouth daily.     diltiazem (TIAZAC) 180 MG 24 hr capsule Take by mouth.     DULoxetine (CYMBALTA) 30 MG capsule TAKE 1 CAPSULE BY MOUTH ONCE DAILY 30 capsule 2  folic acid (FOLVITE) 1 MG tablet TAKE 2 TABLETS BY MOUTH ONCE DAILY 60 tablet 11   Garlic 3976 MG CAPS Take 1,000 mg by mouth in the morning, at noon, and at bedtime. (0800, 1430, 2000)     HYDROcodone-acetaminophen (NORCO/VICODIN) 5-325 MG tablet Take 1 tablet by mouth every 4 (four) hours as needed for moderate pain. 12 tablet 0   levothyroxine (SYNTHROID) 75 MCG tablet TAKE 1 TABLET BY MOUTH ONCE DAILY ON AN EMPTY STOMACH. WAIT 30 MINUTES BEFORE TAKING OTHER MEDS. 90 tablet 1   methotrexate 2.5 MG tablet TAKE 4 TABLETS BY MOUTH ONCE A WEEK WITHFOLIC ACID 16 tablet 5   pantoprazole (PROTONIX) 40 MG tablet Take 40 mg by mouth daily.      Probiotic Product (PROBIOTIC MULTI-ENZYME PO) Take 1 capsule by mouth in the morning and at bedtime. Probiotic Multi-Enzyme Digestive Formula     pyridostigmine (MESTINON) 60 MG tablet TAKE 1 TABLET BY MOUTH 3 TIMES DAILY 90 tablet 3   risperiDONE (RISPERDAL) 1 MG tablet Take 1 mg by mouth at bedtime.      tolterodine (DETROL LA) 4 MG 24 hr capsule TAKE 1 CAPSULE BY MOUTH ONCE EVERY MORNING 90 capsule 1   vitamin E 180 MG (400 UNITS) capsule Take 400 Units by mouth daily at 2 PM. 1430     No facility-administered medications prior to visit.    PAST MEDICAL HISTORY: Past Medical History:  Diagnosis Date   Cancer of kidney ([place]) 7341   Complication of anesthesia    Myasthenia gravis   GERD (gastroesophageal reflux disease)    History of seizure disorder    Hypercholesterolemia    Hypertension    no longer has it. wt loss   Hypothyroidism    Irritable bowel syndrome     Melanoma ([place]) 2017   Melanoma on neck   Myasthenia gravis ([place]) 2011   OSA on CPAP    Thymoma, malignant ([place])     PAST SURGICAL HISTORY: Past Surgical History:  Procedure Laterality Date   ABDOMINAL HYSTERECTOMY  1990   APPENDECTOMY  1999   CHOLECYSTECTOMY  2013   COLONOSCOPY WITH PROPOFOL N/A 06/30/2017   Procedure: COLONOSCOPY WITH PROPOFOL;  Surgeon: Toledo, Benay Pike, MD;  Location: [facility] ENDOSCOPY;  Service: Gastroenterology;  Laterality: N/A;   CYSTOSCOPY W/ URETERAL STENT PLACEMENT Left 08/17/2019   Procedure: CYSTOSCOPY WITH RETROGRADE PYELOGRAM/URETERAL STENT PLACEMENT;  Surgeon: Alexis Frock, MD;  Location: [facility];  Service: Urology;  Laterality: Left;  30 MINS   ESOPHAGOGASTRODUODENOSCOPY (EGD) WITH PROPOFOL N/A 06/30/2017   Procedure: ESOPHAGOGASTRODUODENOSCOPY (EGD) WITH PROPOFOL;  Surgeon: Toledo, Benay Pike, MD;  Location: [facility] ENDOSCOPY;  Service: Gastroenterology;  Laterality: N/A;   IR RADIOLOGIST EVAL & MGMT  06/28/2019   IR RADIOLOGIST EVAL & MGMT  09/14/2019   IR RADIOLOGIST EVAL & MGMT  03/28/2020   IR RADIOLOGIST EVAL & MGMT  10/10/2020   RADIOFREQUENCY ABLATION Left 08/17/2019   Procedure: LEFT RENAL CRYO ABLATION;  Surgeon: Jacqulynn Cadet, MD;  Location: [facility];  Service: Anesthesiology;  Laterality: Left;   TONSILLECTOMY  1959    FAMILY HISTORY: Family History  Problem Relation Age of Onset   Colon cancer Maternal Grandfather        stomach/colon   Heart disease Father        myocardial infarction   Hyperlipidemia Father    Hyperlipidemia Sister    Diabetes Sister    Bone cancer Other        cousin   Alzheimer's  disease Mother        maternal aunts   Skin cancer Mother    Myasthenia gravis Brother        ocular   Skin cancer Brother    Stroke Paternal Grandfather    Breast cancer Neg Hx     SOCIAL HISTORY: Social History   Socioeconomic History   Marital status: Single    Spouse name: Not on file   Number of children: 0   Years of  education: Special Ed   Highest education level: Not on file  Occupational History   Occupation: Unemployed  Tobacco Use   Smoking status: Never   Smokeless tobacco: Never  Vaping Use   Vaping Use: Never used  Substance and Sexual Activity   Alcohol use: No    Alcohol/week: 0.0 standard drinks of alcohol   Drug use: No   Sexual activity: Not Currently  Other Topics Concern   Not on file  Social History Narrative   06/23/17 lives with sister, Madison Burns   Regular exercise-no   Caffeine Use-yes   Social Determinants of Health   Financial Resource Strain: Not on file  Food Insecurity: Not on file  Transportation Needs: Not on file  Physical Activity: Not on file  Stress: Not on file  Social Connections: Not on file  Intimate Partner Violence: Not on file      PHYSICAL EXAM  Vitals:   09/04/21 1427  BP: 123/79  Pulse: 81  Weight: 214 lb (97.1 kg)  Height: '5\' 4"'$  (1.626 m)   Body mass index is 36.73 kg/m.  Neck size : 15" . Mallampati  I 3, small lower jaw. Retrognathia  Lungs wheezing to auscultation, patient cannot whistle not hold her breath longer than 12 seconds-   Generalized: Well developed, in no acute distress . She is fatigued, she is stooped, SOB.  Ankle edema, bilateral .Right over left.  Neurological examination - alert but not fluent, answers questions with some delay.  Mentation: Ms Glanz is developmentally delayed, unable to live alone, she is not fully oriented to place and date.  Cranial nerve : No change in taste or smell reported.  Pupils were equal round reactive to light.  Extraocular movements were full, ptosis bilaterally. Head droopy, left leaning.  Facial sensation and strength were normal. Uvula in midline.  Head turning and shoulder shrug  were weak.   Motor: she feels sore all over, proximal arms and at the hip. Knees, joint pain. She cannot extend either arm above shoulder  level for longer than 10 second, .  Symmetric grip strength noted.   Sensory: cannot feel soft touch and vibration-  neither on knees nor at ankles.  Coordination: good finger-nose bilaterally.  Gait and station: Gait is stooped, she is SOB, her feet are shuffling, turning with 5 steps, very unsteady. Leaning to the left. I deferred a tandem gait, heel or toe stand.   Reflexes: Deep tendon reflexes are absent bilaterally.   DIAGNOSTIC DATA (LABS, IMAGING, TESTING) - I reviewed patient records, labs, notes, testing and imaging myself where available.  ED records, chemotherapy and radiation reports.    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   Lab Results  Component Value Date   WBC 7.2 04/29/2021   HGB 13.5 04/29/2021   HCT 41.5 04/29/2021   MCV 95.4 04/29/2021   PLT 174 04/29/2021      Component Value Date/Time   NA 141 07/25/2021 1048   NA 135 03/06/2021 1407   NA 137 02/21/2012 1828  K 4.0 07/25/2021 1048   K 4.3 02/21/2012 1828   CL 104 07/25/2021 1048   CL 104 02/21/2012 1828   CO2 30 07/25/2021 1048   CO2 25 02/21/2012 1828   GLUCOSE 86 07/25/2021 1048   GLUCOSE 145 (H) 02/21/2012 1828   BUN 16 07/25/2021 1048   BUN 16 03/06/2021 1407   BUN 8 02/21/2012 1828   CREATININE 1.10 07/25/2021 1048   CREATININE 1.14 02/21/2012 1828   CALCIUM 9.1 07/25/2021 1048   CALCIUM 9.0 02/21/2012 1828   PROT 6.0 07/25/2021 1048   PROT 6.3 03/06/2021 1407   PROT 6.4 04/25/2011 0854   ALBUMIN 3.9 07/25/2021 1048   ALBUMIN 4.5 03/06/2021 1407   ALBUMIN 3.4 04/25/2011 0854   AST 27 07/25/2021 1048   AST 18 04/25/2011 0854   ALT 20 07/25/2021 1048   ALT 25 04/25/2011 0854   ALKPHOS 57 07/25/2021 1048   ALKPHOS 45 (L) 04/25/2011 0854   BILITOT 0.5 07/25/2021 1048   BILITOT 0.3 03/06/2021 1407   BILITOT 0.4 04/25/2011 0854   GFRNONAA 52 (L) 04/29/2021 1019   GFRNONAA 52 (L) 02/21/2012 1828   GFRAA >60 11/04/2019 1725   GFRAA >60 02/21/2012 1828   Lab Results  Component Value Date   CHOL 235 (H) 07/25/2021   HDL 59.80 07/25/2021   LDLCALC 153 (H)  07/25/2021   LDLDIRECT 122.0 12/14/2018   TRIG 110.0 07/25/2021   CHOLHDL 4 07/25/2021   Lab Results  Component Value Date   HGBA1C 5.5 07/25/2021   No results found for: "VITAMINB12" Lab Results  Component Value Date   TSH 3.46 07/25/2021       ASSESSMENT : 35 minutes.   Epworth 14/18, higher since she had been in cancer treatment.   Myasthenia, orthopenia, had a ED visit. Has contracted Pneumonia, 03-21-2020. More choking, dysphagia, dysphonia. Started to snore again- no ptosis seen.   71 y.o. year old MRDD patient with history of MG, malignant thymoma, abnormal chest X ray and sepsis, pneumonia and kidney cancer.  Epworth sleepiness again at 14/18, was higher since she had been in cancer treatment. Now on risperdol she sleeps more hours. Also sleepy in daytime.  FSS at [facility], had a ED visit. Has contracted Pneumonia, 03-21-2020. Last years HST was negativ for apnea- atrial  fibrillation, on amiodarone. Chemo and amiodarone may have caused NEUROPATHY.    Myasthenia: More choking, dysphagia, dysphonia. Started to snore again- witnessed by her sister nancy- will  ask for in lab sleep study.  Her enuresis happens at 4-5 Am and we may have to start waking he to go to the bathroom. Wears a pull up in night time. Bed padding needed. Her electrolytes were fine- we can try desmopressin NS at night only.    MG exacerbation after cancer therapy- Leeasia Secrist had been doing very well on Mestinon and resume methotrexate. The therapy was reduced to improve her immune response under the cancer treatments which lasted into May 2023.  Up to July 2021 her physical appearance was unchanged, strong.   We will continue  Mestinon 60 mg 3 times daily as prescribed.  She needs to restart on MTX or prednisone to control AChAb output. Respiratory system can probably improve quicker with some prednisone at this time.    She will need a CMET, CBC diff and folic acid level next visit.     OSA : I ordered an in -lab sleep study- and will follow  through NP  in 4-5 month again.   I spent 48 minutes with the patient. I will ask her to follow up in 4-5 months with NP.  [name], MD    09/04/2021, 2:48 PM [facility] [address], [place], [place] [address] [phone]

## 2021-09-11 ENCOUNTER — Telehealth: Payer: Self-pay | Admitting: Neurology

## 2021-09-11 NOTE — Telephone Encounter (Signed)
LVM for pt to call back to schedule  [employer] medicare/medicaid no auth req

## 2021-09-16 NOTE — Telephone Encounter (Signed)
Patient sister [name] called back and scheduled the patient appoint.  [employer] medicare no auth req- she is scheduled at [facility] for 10/02/21 at 8 pm.  The patient wants [name] to stay the night with her.

## 2021-09-23 ENCOUNTER — Telehealth: Payer: Self-pay | Admitting: Internal Medicine

## 2021-09-23 NOTE — Telephone Encounter (Signed)
S/w Henrene Pastor sx started Friday - heavy cough, clamy feeling, congestion, voice sounds scratchy. [name] claims throat does not hurt, no body aches.  [name] with headache and lots of congestion. Lots of nasal drainage. Sore throat, scratchy voice, Body aches since yesterday. No cough, does not feel clamy or feverish.   Have not checked temps - they do not have a thermometer.   [name] and Kinmundy both tested positive COVID with home tests this morning.   [name] has been using Tylenol and Delsym DM  [name] using Delsym DM only.   Asking if antivirals are option for them - would like to be sent in to [place].

## 2021-09-23 NOTE — Telephone Encounter (Signed)
Madison Burns called on behalf of Madison Burns because they took a home COVID test and it came back positive today.   The patient is experiencing severe cough and low grade fever.  They would like to know what medicine can she take to help.

## 2021-09-23 NOTE — Telephone Encounter (Signed)
Needs to be seen to get antivirals.  I do not see [name], but I do see [name].  See if agreeable to visit as we discussed. let me know if problems.

## 2021-09-23 NOTE — Telephone Encounter (Signed)
Scheduled pt tomorrow with Dr Derrel Nip for virtual. Confirmed with [name] that she is following up with her dr at [facility] for treatment, [name] stated she is.

## 2021-09-24 ENCOUNTER — Telehealth (INDEPENDENT_AMBULATORY_CARE_PROVIDER_SITE_OTHER): Payer: Medicare Other | Admitting: Internal Medicine

## 2021-09-24 ENCOUNTER — Encounter: Payer: Self-pay | Admitting: Internal Medicine

## 2021-09-24 DIAGNOSIS — U071 COVID-19: Secondary | ICD-10-CM | POA: Diagnosis not present

## 2021-09-24 MED ORDER — BENZONATATE 100 MG PO CAPS: ORAL_CAPSULE | ORAL | 0 refills | Status: DC

## 2021-09-24 MED ORDER — MOLNUPIRAVIR EUA 200MG CAPSULE: 4.0000 | ORAL_CAPSULE | Freq: Two times a day (BID) | ORAL | 0 refills | Status: AC

## 2021-09-24 NOTE — Assessment & Plan Note (Signed)
Symptoms appear to be mild and have been present for 4 days.  Sending molnupiravir and cough suppressant to [facility].  Advised to use afrin nasal spray as needed for congestion to prevent mouth breathing, avoid OJ and other acidic beverages that may irritate throat,  Gargle with salt water prn

## 2021-09-24 NOTE — Progress Notes (Signed)
Virtual Visit via [employer]   Note    This format is felt to be most appropriate for this patient at this time.  All issues noted in this document were discussed and addressed.  No physical exam was performed (except for noted visual exam findings with Video Visits).   I connected with Madison Burns on 09/24/21 at  4:45 PM EDT by a video enabled telemedicine application  and verified that I am speaking with the correct person using two identifiers. Location patient: home Location provider: work or home office Persons participating in the virtual visit: patient, provider and patient's sister [name]   I discussed the limitations, risks, security and privacy concerns of performing an evaluation and management service by telephone and the availability of in person appointments. I also discussed with the patient that there may be a patient responsible charge related to this service. The patient expressed understanding and agreed to proceed.   Reason for visit: COVID INFECTION   HPI:  71 yr old female with myasthenia gravis , epilepsy and intellectual impairment presents for treatment of COVID infection confirmed with home test on August 7.  Symptoms of sinus congestion, scratchy throat and cough started on Friday August 4.   No fevers,  no loss of smell or taste,  no body aches.         ROS: See pertinent positives and negatives per HPI.  Past Medical History:  Diagnosis Date   Cancer of kidney ([place]) 6967   Complication of anesthesia    Myasthenia gravis   GERD (gastroesophageal reflux disease)    History of seizure disorder    Hypercholesterolemia    Hypertension    no longer has it. wt loss   Hypothyroidism    Irritable bowel syndrome    Melanoma ([place]) 2017   Melanoma on neck   Myasthenia gravis ([place]) 2011   OSA on CPAP    Thymoma, malignant ([place])     Past Surgical History:  Procedure Laterality Date   [place]  2013    COLONOSCOPY WITH PROPOFOL N/A 06/30/2017   Procedure: COLONOSCOPY WITH PROPOFOL;  Surgeon: Toledo, Benay Pike, MD;  Location: [facility];  Service: Gastroenterology;  Laterality: N/A;   CYSTOSCOPY W/ URETERAL STENT PLACEMENT Left 08/17/2019   Procedure: CYSTOSCOPY WITH RETROGRADE PYELOGRAM/URETERAL STENT PLACEMENT;  Surgeon: Alexis Frock, MD;  Location: [facility];  Service: Urology;  Laterality: Left;  30 MINS   ESOPHAGOGASTRODUODENOSCOPY (EGD) WITH PROPOFOL N/A 06/30/2017   Procedure: ESOPHAGOGASTRODUODENOSCOPY (EGD) WITH PROPOFOL;  Surgeon: Toledo, Benay Pike, MD;  Location: [facility] END[facility];  Service: Gastroenterology;  Laterality: N/A;   IR RADIOLOGIST EVAL & MGMT  06/28/2019   IR RADIOLOGIST EVAL & MGMT  09/14/2019   IR RADIOLOGIST EVAL & MGMT  03/28/2020   IR RADIOLOGIST EVAL & MGMT  10/10/2020   RADIOFREQUENCY ABLATION Left 08/17/2019   Procedure: LEFT RENAL CRYO ABLATION;  Surgeon: Jacqulynn Cadet, MD;  [facility]: [facility];  Service: Anesthesiology;  Laterality: Left;   TONSILLECTOMY  1959    Family History  Problem Relation Age of Onset   Colon cancer Maternal Grandfather        stomach/colon   Heart disease Father        myocardial infarction   Hyperlipidemia Father    Hyperlipidemia Sister    Diabetes Sister    Bone cancer Other        cousin   Alzheimer's disease Mother  maternal aunts   Skin cancer Mother    Myasthenia gravis Brother        ocular   Skin cancer Brother    Stroke Paternal Grandfather    Breast cancer Neg Hx     SOCIAL HX: intellectually impaired ,  requires supervision .    Current Outpatient Medications:    acetaminophen (TYLENOL) 325 MG tablet, Take 2 tablets (650 mg total) by mouth every 6 (six) hours as needed for mild pain (or Fever >/= 101)., Disp: , Rfl:    amiodarone (PACERONE) 200 MG tablet, Take 1 tablet (200 mg total) by mouth daily., Disp: 14 tablet, Rfl: 0   Ascorbic Acid (VITAMIN C) 1000 MG tablet, Take 1,000 mg by mouth at bedtime.,  Disp: , Rfl:    B Complex-C (B-COMPLEX WITH VITAMIN C) tablet, Take 1 tablet by mouth every evening., Disp: , Rfl:    Biotin 10000 MCG TABS, Take 10,000 mcg by mouth daily at 2 PM. (1430), Disp: , Rfl:    Calcium-Phosphorus-Vitamin D (CITRACAL +D3 PO), Take 1 tablet by mouth daily., Disp: , Rfl:    desmopressin (DDAVP NASAL) 0.01 % solution, Place 1 spray (10 mcg total) into the nose 2 (two) times daily., Disp: 5 mL, Rfl: 12   diltiazem (TIAZAC) 180 MG 24 hr capsule, Take by mouth., Disp: , Rfl:    DULoxetine (CYMBALTA) 30 MG capsule, TAKE 1 CAPSULE BY MOUTH ONCE DAILY, Disp: 30 capsule, Rfl: 2   folic acid (FOLVITE) 1 MG tablet, TAKE 2 TABLETS BY MOUTH ONCE DAILY, Disp: 60 tablet, Rfl: 11   Garlic 8466 MG CAPS, Take 1,000 mg by mouth in the morning, at noon, and at bedtime. (0800, 1430, 2000), Disp: , Rfl:    HYDROcodone-acetaminophen (NORCO/VICODIN) 5-325 MG tablet, Take 1 tablet by mouth every 4 (four) hours as needed for moderate pain., Disp: 12 tablet, Rfl: 0   levothyroxine (SYNTHROID) 75 MCG tablet, TAKE 1 TABLET BY MOUTH ONCE DAILY ON AN EMPTY STOMACH. WAIT 30 MINUTES BEFORE TAKING OTHER MEDS., Disp: 90 tablet, Rfl: 1   methotrexate 2.5 MG tablet, TAKE 4 TABLETS BY MOUTH ONCE A WEEK WITHFOLIC ACID, Disp: 16 tablet, Rfl: 5   molnupiravir EUA (LAGEVRIO) 200 mg CAPS capsule, Take 4 capsules (800 mg total) by mouth 2 (two) times daily for 5 days., Disp: 40 capsule, Rfl: 0   pantoprazole (PROTONIX) 40 MG tablet, Take 40 mg by mouth daily. , Disp: , Rfl:    Probiotic Product (PROBIOTIC MULTI-ENZYME PO), Take 1 capsule by mouth in the morning and at bedtime. Probiotic Multi-Enzyme Digestive Formula, Disp: , Rfl:    pyridostigmine (MESTINON) 60 MG tablet, TAKE 1 TABLET BY MOUTH 3 TIMES DAILY, Disp: 90 tablet, Rfl: 3   risperiDONE (RISPERDAL) 1 MG tablet, Take 1 mg by mouth at bedtime. , Disp: , Rfl:    tolterodine (DETROL LA) 4 MG 24 hr capsule, TAKE 1 CAPSULE BY MOUTH ONCE EVERY MORNING, Disp: 90  capsule, Rfl: 1   vitamin E 180 MG (400 UNITS) capsule, Take 400 Units by mouth daily at 2 PM. 1430, Disp: , Rfl:    benzonatate (TESSALON) 100 MG capsule, TAKE 1 CAPSULE BY MOUTH TWICE DAILY AS NEEDED COUGH, Disp: 90 capsule, Rfl: 0  EXAM:  VITALS per patient if applicable:  GENERAL: alert, oriented, appears well and in no acute distress  HEENT: atraumatic, conjunttiva clear, no obvious abnormalities on inspection of external nose and ears  NECK: normal movements of the head and neck  LUNGS:  on inspection no signs of respiratory distress, breathing rate appears normal, no obvious Mcpeters SOB, gasping or wheezing  CV: no obvious cyanosis  Madison: moves all visible extremities without noticeable abnormality  PSYCH/NEURO: pleasant and cooperative, no obvious depression or anxiety, speech and thought processing grossly intact  ASSESSMENT AND PLAN:  Discussed the following assessment and plan:  COVID-19 virus infection  COVID-19 virus infection Symptoms appear to be mild and have been present for 4 days.  Sending molnupiravir and cough suppressant to [facility].  Advised to use afrin nasal spray as needed for congestion to prevent mouth breathing, avoid OJ and other acidic beverages that may irritate throat,  Gargle with salt water prn     I discussed the assessment and treatment plan with the patient. The patient was provided an opportunity to ask questions and all were answered. The patient agreed with the plan and demonstrated an understanding of the instructions.   The patient was advised to call back or seek an in-person evaluation if the symptoms worsen or if the condition fails to improve as anticipated.   I spent 20 minutes dedicated to the care of this patient on the date of this encounter to include pre-visit review of his medical history,  Face-to-face time with the patient , and post visit ordering of testing and therapeutics.    Crecencio Mc, MD

## 2021-09-26 ENCOUNTER — Other Ambulatory Visit: Payer: Self-pay | Admitting: Oncology

## 2021-10-22 ENCOUNTER — Other Ambulatory Visit: Payer: Self-pay | Admitting: Neurology

## 2021-10-22 DIAGNOSIS — F73 Profound intellectual disabilities: Secondary | ICD-10-CM

## 2021-10-22 DIAGNOSIS — G40309 Generalized idiopathic epilepsy and epileptic syndromes, not intractable, without status epilepticus: Secondary | ICD-10-CM

## 2021-10-22 DIAGNOSIS — G4733 Obstructive sleep apnea (adult) (pediatric): Secondary | ICD-10-CM

## 2021-10-31 ENCOUNTER — Encounter: Payer: Self-pay | Admitting: Interventional Radiology

## 2021-11-05 ENCOUNTER — Other Ambulatory Visit: Payer: Self-pay | Admitting: Internal Medicine

## 2021-11-05 NOTE — Telephone Encounter (Addendum)
Refilled: 09/24/2021 Last OV: 09/24/2021 Next OV: 11/11/2021

## 2021-11-07 ENCOUNTER — Ambulatory Visit
Admission: RE | Admit: 2021-11-07 | Discharge: 2021-11-07 | Disposition: A | Discharge status: Home / Self Care | Payer: Medicare Other | Source: Ambulatory Visit | Attending: Radiation Oncology | Admitting: Radiation Oncology

## 2021-11-07 ENCOUNTER — Encounter: Payer: Self-pay | Admitting: Radiation Oncology

## 2021-11-07 VITALS — BP 132/79 | HR 73 | Temp 97.7°F | Resp 16 | Wt 223.0 lb

## 2021-11-07 DIAGNOSIS — Z923 Personal history of irradiation: Secondary | ICD-10-CM | POA: Insufficient documentation

## 2021-11-07 DIAGNOSIS — R0989 Other specified symptoms and signs involving the circulatory and respiratory systems: Secondary | ICD-10-CM | POA: Diagnosis not present

## 2021-11-07 DIAGNOSIS — C642 Malignant neoplasm of left kidney, except renal pelvis: Secondary | ICD-10-CM | POA: Insufficient documentation

## 2021-11-07 DIAGNOSIS — D4989 Neoplasm of unspecified behavior of other specified sites: Secondary | ICD-10-CM | POA: Diagnosis not present

## 2021-11-07 DIAGNOSIS — Z8589 Personal history of malignant neoplasm of other organs and systems: Secondary | ICD-10-CM | POA: Diagnosis not present

## 2021-11-07 DIAGNOSIS — C37 Malignant neoplasm of thymus: Secondary | ICD-10-CM

## 2021-11-07 NOTE — Progress Notes (Signed)
Radiation Oncology Follow up Note  Name: Madison Burns   Date:   11/07/2021 MRN:  [id number] DOB: 1950/05/18    This 71 y.o. female presents to the clinic today for 2 and half years follow-up status post external beam radiation therapy to mediastinum for a thymoma Masako stage IVa.  REFERRING PROVIDER: Einar Pheasant, MD  HPI: Patient is a 71 year old female now out close to 3 years having completed external beam radiation therapy to her mediastinum for a Masako stage IVa thymoma.  Seen today in routine follow-up she is doing well still occasionally has some slight choking mother states that she has takes too big a bite. She had a CT scan of her chest back in March showing stable exam no evidence of progressive disease in her mediastinum.  Mediastinum has some dystrophic calcifications Khattab compatible with treated thymoma.  She also has a meningioma on MRI of her brain which has been stable over the past decade.  Patient is also having cryoablation of a renal cell carcinoma of her left kidney.  That is also stable over time  COMPLICATIONS OF TREATMENT: none  FOLLOW UP COMPLIANCE: keeps appointments   PHYSICAL EXAM:  BP 132/79 (BP Location: Right Arm, Patient Position: Sitting)   Pulse 73   Temp 97.7 F (36.5 C) (Tympanic)   Resp 16   Wt 223 lb (101.2 kg)   BMI 38.28 kg/m  Well-developed well-nourished patient in NAD. HEENT reveals PERLA, EOMI, discs not visualized.  Oral cavity is clear. No oral mucosal lesions are identified. Neck is clear without evidence of cervical or supraclavicular adenopathy. Lungs are clear to A&P. Cardiac examination is essentially unremarkable with regular rate and rhythm without murmur rub or thrill. Abdomen is benign with no organomegaly or masses noted. Motor sensory and DTR levels are equal and symmetric in the upper and lower extremities. Cranial nerves II through XII are grossly intact. Proprioception is intact. No peripheral adenopathy or edema is  identified. No motor or sensory levels are noted. Crude visual fields are within normal range.  RADIOLOGY RESULTS: CT scans reviewed MRI of her brain reviewed compatible with above-stated findings  PLAN: The present time patient continues to do well from her thymoma standpoint.  All films shows disease to be stable.  No evidence of progressive disease.  At this time I am going to turn follow-up care over to Dr. Tasia Catchings.  I would be happy to reevaluate the patient anytime in the future should that be indicated.  I would like to take this opportunity to thank you for allowing me to participate in the care of your patient.Noreene Filbert, MD

## 2021-11-11 ENCOUNTER — Ambulatory Visit (INDEPENDENT_AMBULATORY_CARE_PROVIDER_SITE_OTHER): Payer: Medicare Other | Admitting: Internal Medicine

## 2021-11-11 ENCOUNTER — Encounter: Payer: Self-pay | Admitting: Internal Medicine

## 2021-11-11 DIAGNOSIS — G4733 Obstructive sleep apnea (adult) (pediatric): Secondary | ICD-10-CM

## 2021-11-11 DIAGNOSIS — Z9989 Dependence on other enabling machines and devices: Secondary | ICD-10-CM | POA: Diagnosis not present

## 2021-11-11 DIAGNOSIS — G7000 Myasthenia gravis without (acute) exacerbation: Secondary | ICD-10-CM

## 2021-11-11 DIAGNOSIS — J029 Acute pharyngitis, unspecified: Secondary | ICD-10-CM | POA: Diagnosis not present

## 2021-11-11 DIAGNOSIS — I10 Essential (primary) hypertension: Secondary | ICD-10-CM | POA: Diagnosis not present

## 2021-11-11 DIAGNOSIS — E039 Hypothyroidism, unspecified: Secondary | ICD-10-CM

## 2021-11-11 DIAGNOSIS — C642 Malignant neoplasm of left kidney, except renal pelvis: Secondary | ICD-10-CM | POA: Diagnosis not present

## 2021-11-11 DIAGNOSIS — I480 Paroxysmal atrial fibrillation: Secondary | ICD-10-CM | POA: Diagnosis not present

## 2021-11-11 DIAGNOSIS — D696 Thrombocytopenia, unspecified: Secondary | ICD-10-CM | POA: Diagnosis not present

## 2021-11-11 NOTE — Progress Notes (Signed)
Patient ID: Madison Burns, female   DOB: 1951-01-27, 71 y.o.   MRN: [id number]   Subjective:    Patient ID: Madison Burns, female    DOB: 12/03/1950, 71 y.o.   MRN: [id number]   Patient here for a scheduled physical.  Given she is sick, visit changed to f/u visit.  [name]   HPI She is accompanied by her sister.  History obtained from both of them.  Reports that starting Saturday night - had diarrhea.  Sunday - sore throat.  Temp 99.  Taking tylenol.  No increased sinus pressure reported.  No chest pain or sob reported.  No increased cough or congestion.  Is able to eat.  Some nasal congestion.    Past Medical History:  Diagnosis Date   Cancer of kidney ([place]) 9629   Complication of anesthesia    Myasthenia gravis   GERD (gastroesophageal reflux disease)    History of seizure disorder    Hypercholesterolemia    Hypertension    no longer has it. wt loss   Hypothyroidism    Irritable bowel syndrome    Melanoma ([place]) 2017   Melanoma on neck   Myasthenia gravis ([place]) 2011   OSA on CPAP    Thymoma, malignant ([place])    Past Surgical History:  Procedure Laterality Date   [place]  2013   COLONOSCOPY WITH PROPOFOL N/A 06/30/2017   Procedure: COLONOSCOPY WITH PROPOFOL;  Surgeon: Toledo, Benay Pike, MD;  Location: [facility];  Service: Gastroenterology;  Laterality: N/A;   CYSTOSCOPY W/ URETERAL STENT PLACEMENT Left 08/17/2019   Procedure: CYSTOSCOPY WITH RETROGRADE PYELOGRAM/URETERAL STENT PLACEMENT;  Surgeon: Alexis Frock, MD;  [facility];  Service: Urology;  Laterality: Left;  30 MINS   ESOPHAGOGASTRODUODENOSCOPY (EGD) WITH PROPOFOL N/A 06/30/2017   Procedure: ESOPHAGOGASTRODUODENOSCOPY (EGD) WITH PROPOFOL;  Surgeon: Toledo, Benay Pike, MD;  Location: [facility];  Service: Gastroenterology;  Laterality: N/A;   IR RADIOLOGIST EVAL & MGMT  06/28/2019   IR RADIOLOGIST EVAL & MGMT  09/14/2019   IR RADIOLOGIST EVAL & MGMT   03/28/2020   IR RADIOLOGIST EVAL & MGMT  10/10/2020   RADIOFREQUENCY ABLATION Left 08/17/2019   Procedure: LEFT RENAL CRYO ABLATION;  Surgeon: Jacqulynn Cadet, MD;  [facility];  Service: Anesthesiology;  Laterality: Left;   TONSILLECTOMY  1959   Family History  Problem Relation Age of Onset   Colon cancer Maternal Grandfather        stomach/colon   Heart disease Father        myocardial infarction   Hyperlipidemia Father    Hyperlipidemia Sister    Diabetes Sister    Bone cancer Other        cousin   Alzheimer's disease Mother        maternal aunts   Skin cancer Mother    Myasthenia gravis Brother        ocular   Skin cancer Brother    Stroke Paternal Grandfather    Breast cancer Neg Hx    Social History   Socioeconomic History   Marital status: Single    Spouse name: Not on file   Number of children: 0   Years of education: Special Ed   Highest education level: Not on file  Occupational History   Occupation: Unemployed  Tobacco Use   Smoking status: Never   Smokeless tobacco: Never  Vaping Use   Vaping Use: Never used  Substance and  Sexual Activity   Alcohol use: No    Alcohol/week: 0.0 standard drinks of alcohol   Drug use: No   Sexual activity: Not Currently  Other Topics Concern   Not on file  Social History Narrative   06/23/17 lives with sister, Madison Burns   Regular exercise-no   Caffeine Use-yes   Social Determinants of Health   Financial Resource Strain: Not on file  Food Insecurity: Not on file  Transportation Needs: Not on file  Physical Activity: Not on file  Stress: Not on file  Social Connections: Not on file     Review of Systems  Constitutional:  Negative for appetite change and unexpected weight change.  HENT:  Positive for sore throat. Negative for sinus pressure.        Some nasal congestion.   Respiratory:  Negative for cough, chest tightness and shortness of breath.   Cardiovascular:  Negative for chest pain, palpitations and leg  swelling.  Gastrointestinal:  Negative for abdominal pain, nausea and vomiting.       Previous diarrhea as outlined.   Genitourinary:  Negative for difficulty urinating and dysuria.  Musculoskeletal:  Negative for joint swelling and myalgias.  Skin:  Negative for color change and rash.  Neurological:  Negative for dizziness and headaches.  Psychiatric/Behavioral:  Negative for agitation and dysphoric mood.        Objective:     BP 120/80 (BP Location: Left Arm, Patient Position: Sitting, Cuff Size: Normal)   Pulse 76   Temp 99 F (37.2 C) (Oral)   Ht '5\' 4"'$  (1.626 m)   Wt 218 lb (98.9 kg)   SpO2 95%   BMI 37.42 kg/m  Wt Readings from Last 3 Encounters:  11/11/21 218 lb (98.9 kg)  11/07/21 223 lb (101.2 kg)  09/24/21 214 lb (97.1 kg)    Physical Exam Vitals reviewed.  Constitutional:      General: She is not in acute distress.    Appearance: Normal appearance.  HENT:     Head: Normocephalic and atraumatic.     Right Ear: External ear normal.     Left Ear: External ear normal.     Nose: Nose normal.     Mouth/Throat:     Pharynx: No oropharyngeal exudate.     Comments: Minimal erythema.  Eyes:     General: No scleral icterus.       Right eye: No discharge.        Left eye: No discharge.     Conjunctiva/sclera: Conjunctivae normal.  Neck:     Thyroid: No thyromegaly.  Cardiovascular:     Rate and Rhythm: Normal rate and regular rhythm.  Pulmonary:     Effort: No respiratory distress.     Breath sounds: Normal breath sounds. No wheezing.  Abdominal:     General: Bowel sounds are normal.     Palpations: Abdomen is soft.     Tenderness: There is no abdominal tenderness.  Musculoskeletal:        General: No swelling or tenderness.     Cervical back: Neck supple. No tenderness.  Lymphadenopathy:     Cervical: No cervical adenopathy.  Skin:    Findings: No erythema or rash.  Neurological:     Mental Status: She is alert.  Psychiatric:        Mood and Affect:  Mood normal.        Behavior: Behavior normal.      Outpatient Encounter Medications as of 11/11/2021  Medication Sig  acetaminophen (TYLENOL) 325 MG tablet Take 2 tablets (650 mg total) by mouth every 6 (six) hours as needed for mild pain (or Fever >/= 101).   amiodarone (PACERONE) 200 MG tablet Take 1 tablet (200 mg total) by mouth daily.   Ascorbic Acid (VITAMIN C) 1000 MG tablet Take 1,000 mg by mouth at bedtime.   B Complex-C (B-COMPLEX WITH VITAMIN C) tablet Take 1 tablet by mouth every evening.   benzonatate (TESSALON) 100 MG capsule TAKE 1 CAPSULE BY MOUTH TWICE DAILY AS NEEDED FOR COUGH   Biotin 10000 MCG TABS Take 10,000 mcg by mouth daily at 2 PM. (1430)   Calcium-Phosphorus-Vitamin D (CITRACAL +D3 PO) Take 1 tablet by mouth daily.   desmopressin (DDAVP NASAL) 0.01 % solution Place 1 spray (10 mcg total) into the nose 2 (two) times daily.   diltiazem (TIAZAC) 180 MG 24 hr capsule Take by mouth.   DULoxetine (CYMBALTA) 30 MG capsule TAKE 1 CAPSULE BY MOUTH ONCE DAILY   folic acid (FOLVITE) 1 MG tablet TAKE 2 TABLETS BY MOUTH ONCE DAILY   Garlic 1308 MG CAPS Take 1,000 mg by mouth in the morning, at noon, and at bedtime. (0800, 1430, 2000)   levothyroxine (SYNTHROID) 75 MCG tablet TAKE 1 TABLET BY MOUTH ONCE DAILY ON AN EMPTY STOMACH. WAIT 30 MINUTES BEFORE TAKING OTHER MEDS.   methotrexate 2.5 MG tablet TAKE 4 TABLETS BY MOUTH ONCE A WEEK WITHFOLIC ACID   pantoprazole (PROTONIX) 40 MG tablet Take 40 mg by mouth daily.    Probiotic Product (PROBIOTIC MULTI-ENZYME PO) Take 1 capsule by mouth in the morning and at bedtime. Probiotic Multi-Enzyme Digestive Formula   pyridostigmine (MESTINON) 60 MG tablet TAKE 1 TABLET BY MOUTH 3 TIMES DAILY   risperiDONE (RISPERDAL) 1 MG tablet Take 1 mg by mouth at bedtime.    tolterodine (DETROL Burns) 4 MG 24 hr capsule TAKE 1 CAPSULE BY MOUTH ONCE EVERY MORNING   vitamin E 180 MG (400 UNITS) capsule Take 400 Units by mouth daily at 2 PM. 1430   No  facility-administered encounter medications on file as of 11/11/2021.     Lab Results  Component Value Date   WBC 7.2 04/29/2021   HGB 13.5 04/29/2021   HCT 41.5 04/29/2021   PLT 174 04/29/2021   GLUCOSE 86 07/25/2021   CHOL 235 (H) 07/25/2021   TRIG 110.0 07/25/2021   HDL 59.80 07/25/2021   LDLDIRECT 122.0 12/14/2018   LDLCALC 153 (H) 07/25/2021   ALT 20 07/25/2021   AST 27 07/25/2021   NA 141 07/25/2021   K 4.0 07/25/2021   CL 104 07/25/2021   CREATININE 1.10 07/25/2021   BUN 16 07/25/2021   CO2 30 07/25/2021   TSH 3.46 07/25/2021   INR 1.0 10/23/2019   HGBA1C 5.5 07/25/2021       Assessment & Plan:   Problem List Items Addressed This Visit     Atrial fibrillation [facility])    Seeing cardiology.  On amiodarone and cardizem.  Appears to be in SR.  Follow.       Hypertension    Blood pressure has been doing well. On oral cardizem.  Follow pressures and metabolic panel.       Hypothyroidism    On thyroid replacement.  Follow tsh.       Myasthenia gravis ([place])    Followed by neurology.  Stable.       OSA on CPAP    Followed by neurology.  Renal cell cancer, left ([place])    Saw Dr Erlene Quan 05/2021.  Recommended f/u MRI 1 year.       Sore throat    Increased sore throat.  Minimal nasal congestion.  Throat culture obtained.  Tylenol.  Amoxicillin.  Gargle salt water.  Follow.        Relevant Orders   Culture, Group A Strep (Completed)   Thrombocytopenia ([place])    Followed by oncology.         Einar Pheasant, MD

## 2021-11-12 ENCOUNTER — Telehealth: Payer: Self-pay | Admitting: Internal Medicine

## 2021-11-12 ENCOUNTER — Ambulatory Visit
Admission: RE | Admit: 2021-11-12 | Discharge: 2021-11-12 | Disposition: A | Discharge status: Home / Self Care | Payer: Medicare Other | Source: Ambulatory Visit | Attending: Interventional Radiology | Admitting: Interventional Radiology

## 2021-11-12 DIAGNOSIS — M439 Deforming dorsopathy, unspecified: Secondary | ICD-10-CM | POA: Diagnosis not present

## 2021-11-12 DIAGNOSIS — Z9889 Other specified postprocedural states: Secondary | ICD-10-CM | POA: Diagnosis not present

## 2021-11-12 DIAGNOSIS — Z85528 Personal history of other malignant neoplasm of kidney: Secondary | ICD-10-CM | POA: Diagnosis not present

## 2021-11-12 DIAGNOSIS — M47816 Spondylosis without myelopathy or radiculopathy, lumbar region: Secondary | ICD-10-CM | POA: Diagnosis not present

## 2021-11-12 DIAGNOSIS — N2889 Other specified disorders of kidney and ureter: Secondary | ICD-10-CM | POA: Diagnosis not present

## 2021-11-12 MED ORDER — GADOPICLENOL 0.5 MMOL/ML IV SOLN
9.0000 mL | Freq: Once | INTRAVENOUS | Status: AC | PRN
  Administered 2021-11-12: 9 mL via INTRAVENOUS

## 2021-11-12 MED ORDER — AMOXICILLIN 875 MG PO TABS: 875.0000 mg | ORAL_TABLET | Freq: Two times a day (BID) | ORAL | 0 refills | Status: AC

## 2021-11-12 NOTE — Telephone Encounter (Signed)
Patient was seen by Dr Nicki Reaper yesterday for sore throat. Patient' s sister called and said that Dr Nicki Reaper was going to call in a antibiotic, patient's pharmacy does not have that prescription.

## 2021-11-12 NOTE — Telephone Encounter (Signed)
Rx sent in for amoxicillin.  Please call

## 2021-11-12 NOTE — Telephone Encounter (Addendum)
Lvm to inform pt that Amoxicillin has been sent to [facility] for pt to start 1 tab 2x per day for 10 days.

## 2021-11-13 LAB — CULTURE, GROUP A STREP
MICRO NUMBER:: 13963262
SPECIMEN QUALITY:: ADEQUATE

## 2021-11-17 ENCOUNTER — Encounter: Payer: Self-pay | Admitting: Internal Medicine

## 2021-11-17 NOTE — Assessment & Plan Note (Signed)
Followed by neurology.  Stable  

## 2021-11-17 NOTE — Assessment & Plan Note (Signed)
Seeing cardiology.  On amiodarone and cardizem.  Appears to be in SR.  Follow.  

## 2021-11-17 NOTE — Assessment & Plan Note (Signed)
On thyroid replacement.  Follow tsh.  

## 2021-11-17 NOTE — Assessment & Plan Note (Signed)
Saw Dr Erlene Quan 05/2021.  Recommended f/u MRI 1 year.

## 2021-11-17 NOTE — Assessment & Plan Note (Signed)
Increased sore throat.  Minimal nasal congestion.  Throat culture obtained.  Tylenol.  Amoxicillin.  Gargle salt water.  Follow.

## 2021-11-17 NOTE — Assessment & Plan Note (Signed)
Blood pressure has been doing well. On oral cardizem.  Follow pressures and metabolic panel.  

## 2021-11-17 NOTE — Assessment & Plan Note (Signed)
Followed by oncology 

## 2021-11-17 NOTE — Assessment & Plan Note (Signed)
Followed by neurology.   

## 2021-11-22 ENCOUNTER — Encounter: Payer: Self-pay | Admitting: *Deleted

## 2021-11-22 ENCOUNTER — Ambulatory Visit
Admission: RE | Admit: 2021-11-22 | Discharge: 2021-11-22 | Disposition: A | Discharge status: Home / Self Care | Payer: Medicare Other | Source: Ambulatory Visit | Attending: Interventional Radiology | Admitting: Interventional Radiology

## 2021-11-22 DIAGNOSIS — Z9889 Other specified postprocedural states: Secondary | ICD-10-CM | POA: Diagnosis not present

## 2021-11-22 DIAGNOSIS — N2889 Other specified disorders of kidney and ureter: Secondary | ICD-10-CM | POA: Diagnosis not present

## 2021-11-22 HISTORY — PX: IR RADIOLOGIST EVAL & MGMT: IMG5224

## 2021-11-22 NOTE — Progress Notes (Signed)
Chief Complaint: Patient was consulted remotely today (TeleHealth) for left renal cell carcinoma at the request of Madison Coots K.    Referring Physician(s): Hollice Espy, MD  History of Present Illness: Madison Burns is a 71 y.o. female with multiple medical problems including developmental delay, myasthenia gravis, malignant invasive thymoma  and left renal cell carcinoma.  I spoke predominantly with her sister who serves as her primary caregiver via telephone conference.   Her left lower pole renal lesion was first incidentally noted on a CT scan of the abdomen and pelvis from 02/07/2019.  Subsequent work-up with MRI performed to evaluate her right upper quadrant pain and left-sided renal mass was performed on 02/21/2019.  The MRI identified and endophytic 3.2 x 2.6 cm avidly enhancing lesion in the posterior lower pole of the left kidney.  Unfortunately, evaluation was somewhat limited by respiratory motion artifact.  Additionally, a lesion was identified in the mediastinum which was subsequently further evaluated by a dedicated chest CT.   She then underwent biopsy of both the left lower pole renal lesion and the mediastinal mass in February 2021.   She underwent a CT biopsy on 03/28/2019.  Biopsy consistent with renal cell carcinoma.   Pathology for the mediastinal mass came back as positive for invasive thymoma without evidence of malignancy.  The pathology for the left lower pole renal lesion came back as positive for renal cell carcinoma.   She first underwent treatment for her thymoma in which she received radiation treatment on 06/08/19 and underwent 2 cycles of reduced carboplatin and Taxol, now complete.  Her sister reports that she tolerated both the chemotherapy and the radiation therapy very well with no complications or side effects.   Surveillance CT Abdomen w wo contrast from 06/15/19 revealed stable appearance of her biopsy-proven left lower pole renal cell carcinoma.   I have reviewed her imaging independently.  The lesion is quite endophytic and extends toward the renal collecting system and the hilum.  By my measurements, the lesion measures up to 4 cm in diameter, slightly larger than previously reported.   She subsequently underwent placement of a left-sided double-J ureteral stent by urology followed by percutaneous renal cryoablation on 08/17/2019.  Her postprocedural course was complicated by hemorrhage into the perinephric space at the end of the procedure.  However, she remained hemodynamically stable and was carefully monitored.  The bleeding was self-limited and she was discharged home the next day in good condition.  We are meeting today in follow-up via teleconference.  As usual, her sister is present with her and providing the majority of her medical history.   MRI 03/23/2020: Postprocedural changes related to left lower pole renal cryoablation. No evidence of viable tumor.   MRI 10/07/20:  Unchanged post ablation appearance of the posterior inferior pole of the left kidney. No evidence of recurrent soft tissue or abnormal contrast enhancement.  MRI 11/13/21: Stable post ablation change in the posterior left lower pole kidney without evidence of local recurrence. No evidence of metastatic disease in the abdomen.   Madison Burns and her sister were both pleased to hear the good results of her MRI.  No new or significant clinical issues.   Past Medical History:  Diagnosis Date   Cancer of kidney ([place]) 6295   Complication of anesthesia    Myasthenia gravis   GERD (gastroesophageal reflux disease)    History of seizure disorder    Hypercholesterolemia    Hypertension    no longer has it.  wt loss   Hypothyroidism    Irritable bowel syndrome    Melanoma ([place]) 2017   Melanoma on neck   Myasthenia gravis ([place]) 2011   OSA on CPAP    Thymoma, malignant ([place])     Past Surgical History:  Procedure Laterality Date   [place]  2013   COLONOSCOPY WITH PROPOFOL N/A 06/30/2017   Procedure: COLONOSCOPY WITH PROPOFOL;  Surgeon: Toledo, Benay Pike, MD;  Location: [facility] END[facility];  Service: Gastroenterology;  Laterality: N/A;   CYSTOSCOPY W/ URETERAL STENT PLACEMENT Left 08/17/2019   Procedure: CYSTOSCOPY WITH RETROGRADE PYELOGRAM/URETERAL STENT PLACEMENT;  Surgeon: Alexis Frock, MD;  Location: [facility];  Service: Urology;  Laterality: Left;  30 MINS   ESOPHAGOGASTRODUODENOSCOPY (EGD) WITH PROPOFOL N/A 06/30/2017   Procedure: ESOPHAGOGASTRODUODENOSCOPY (EGD) WITH PROPOFOL;  Surgeon: Toledo, Benay Pike, MD;  Location: [facility] ENDOSCOPY;  Service: Gastroenterology;  Laterality: N/A;   IR RADIOLOGIST EVAL & MGMT  06/28/2019   IR RADIOLOGIST EVAL & MGMT  09/14/2019   IR RADIOLOGIST EVAL & MGMT  03/28/2020   IR RADIOLOGIST EVAL & MGMT  10/10/2020   RADIOFREQUENCY ABLATION Left 08/17/2019   Procedure: LEFT RENAL CRYO ABLATION;  Surgeon: Jacqulynn Cadet, MD;  Location: [facility];  Service: Anesthesiology;  Laterality: Left;   TONSILLECTOMY  1959    Allergies: Patient has no known allergies.  Medications: Prior to Admission medications   Medication Sig Start Date End Date Taking? Authorizing Provider  acetaminophen (TYLENOL) 325 MG tablet Take 2 tablets (650 mg total) by mouth every 6 (six) hours as needed for mild pain (or Fever >/= 101). 04/13/20   Cherene Altes, MD  amiodarone (PACERONE) 200 MG tablet Take 1 tablet (200 mg total) by mouth daily. 04/14/20   Cherene Altes, MD  amoxicillin (AMOXIL) 875 MG tablet Take 1 tablet (875 mg total) by mouth 2 (two) times daily for 10 days. 11/12/21 11/22/21  Einar Pheasant, MD  Ascorbic Acid (VITAMIN C) 1000 MG tablet Take 1,000 mg by mouth at bedtime.    [provider]  B Complex-C (B-COMPLEX WITH VITAMIN C) tablet Take 1 tablet by mouth every evening.    [provider]  benzonatate (TESSALON) 100 MG capsule TAKE 1 CAPSULE BY MOUTH  TWICE DAILY AS NEEDED FOR COUGH 11/06/21   Einar Pheasant, MD  Biotin 10000 MCG TABS Take 10,000 mcg by mouth daily at 2 PM. (1430)    [provider]  Calcium-Phosphorus-Vitamin D (CITRACAL +D3 PO) Take 1 tablet by mouth daily.    [provider]  desmopressin (DDAVP NASAL) 0.01 % solution Place 1 spray (10 mcg total) into the nose 2 (two) times daily. 09/04/21   Dohmeier, Asencion Partridge, MD  diltiazem [facility]) 180 MG 24 hr capsule Take by mouth. 05/21/20   [provider]  DULoxetine (CYMBALTA) 30 MG capsule TAKE 1 CAPSULE BY MOUTH ONCE DAILY 09/27/21   Earlie Server, MD  folic acid (FOLVITE) 1 MG tablet TAKE 2 TABLETS BY MOUTH ONCE DAILY 03/13/21   Dohmeier, Asencion Partridge, MD  Garlic 4008 MG CAPS Take 1,000 mg by mouth in the morning, at noon, and at bedtime. (0800, 1430, 2000)    [provider]  levothyroxine (SYNTHROID) 75 MCG tablet TAKE 1 TABLET BY MOUTH ONCE DAILY ON AN EMPTY STOMACH. WAIT 30 MINUTES BEFORE TAKING OTHER MEDS. 05/17/21   Einar Pheasant, MD  methotrexate 2.5 MG tablet TAKE 4 TABLETS BY MOUTH ONCE A WEEK WITHFOLIC ACID  05/15/21   Dohmeier, Asencion Partridge, MD  pantoprazole (PROTONIX) 40 MG tablet Take 40 mg by mouth daily.  11/24/17   [provider]  Probiotic Product (PROBIOTIC MULTI-ENZYME PO) Take 1 capsule by mouth in the morning and at bedtime. Probiotic Multi-Enzyme Digestive Formula    [provider]  pyridostigmine (MESTINON) 60 MG tablet TAKE 1 TABLET BY MOUTH 3 TIMES DAILY 07/29/21   Dohmeier, Asencion Partridge, MD  risperiDONE (RISPERDAL) 1 MG tablet Take 1 mg by mouth at bedtime.  07/08/18   [provider]  tolterodine (DETROL LA) 4 MG 24 hr capsule TAKE 1 CAPSULE BY MOUTH ONCE EVERY MORNING 05/17/21   Einar Pheasant, MD  vitamin E 180 MG (400 UNITS) capsule Take 400 Units by mouth daily at 2 PM. 1430    [provider]     Family History  Problem Relation Age of Onset   Colon cancer Maternal Grandfather        stomach/colon   Heart  disease Father        myocardial infarction   Hyperlipidemia Father    Hyperlipidemia Sister    Diabetes Sister    Bone cancer Other        cousin   Alzheimer's disease Mother        maternal aunts   Skin cancer Mother    Myasthenia gravis Brother        ocular   Skin cancer Brother    Stroke Paternal Grandfather    Breast cancer Neg Hx     Social History   Socioeconomic History   Marital status: Single    Spouse name: Not on file   Number of children: 0   Years of education: Special Ed   Highest education level: Not on file  Occupational History   Occupation: Unemployed  Tobacco Use   Smoking status: Never   Smokeless tobacco: Never  Vaping Use   Vaping Use: Never used  Substance and Sexual Activity   Alcohol use: No    Alcohol/week: 0.0 standard drinks of alcohol   Drug use: No   Sexual activity: Not Currently  Other Topics Concern   Not on file  Social History Narrative   06/23/17 lives with sister, [name]   Regular exercise-no   Caffeine Use-yes   Social Determinants of Health   Financial Resource Strain: Not on file  Food Insecurity: Not on file  Transportation Needs: Not on file  Physical Activity: Not on file  Stress: Not on file  Social Connections: Not on file    ECOG Status: 0 - Asymptomatic  Review of Systems  Review of Systems: A 12 point ROS discussed and pertinent positives are indicated in the HPI above.  All other systems are negative.  Advance Care Plan: The advanced care plan/surrogate decision maker was discussed at the time of visit and the patient did not wish to discuss or was not able to name a surrogate decision maker or provide an advance care plan.    Physical Exam No direct physical exam was performed (except for noted visual exam findings with Video Visits).    Vital Signs: There were no vitals taken for this visit.  Imaging: MR ABDOMEN WWO CONTRAST  Result Date: 11/13/2021 CLINICAL DATA:  History of left renal cell  carcinoma status post cryoablation August 17, 2019, follow-up/surveillance. EXAM: MRI ABDOMEN WITHOUT AND WITH CONTRAST TECHNIQUE: Multiplanar multisequence MR imaging of the abdomen was performed both before and after the administration of intravenous contrast. CONTRAST:  9 cc  of Vueway COMPARISON:  Multiple priors including most recent MRI May 02, 2021 FINDINGS: Study is slightly degraded by respiratory motion limiting sensitivity and specificity. Lower chest: Heterogeneous signal in the lung bases commonly reflects atelectasis. Hepatobiliary: No significant hepatic steatosis. No suspicious hepatic lesion. Gallbladder surgically absent. No biliary ductal dilation. Pancreas: No pancreatic ductal dilation or evidence of acute inflammation. Spleen:  No splenomegaly. Adrenals/Urinary Tract: Similar nodular thickening of the left adrenal gland measuring 12 mm in thickness with loss of signal on out of phase imaging consistent with benign hyperplasia/adenoma requiring no independent imaging follow-up. Right adrenal gland appears normal. No hydronephrosis. Bilateral fluid density renal lesions measure up to 2.5 cm on the left without suspicious postcontrast enhancement consistent with benign cysts which require no independent imaging follow-up. Similar appearance of the previously cryoablated heterogeneous posterior left lower pole renal lesion measuring 2.4 x 2.2 cm on image 53/13, unchanged when remeasured for consistency with similar adjacent posttreatment change/fat necrosis. No new suspicious enhancing nodularity in the treatment bed. Stomach/Bowel: Stomach is unremarkable for degree of distension. No pathologic dilation or evidence of acute inflammation involving loops of large or small bowel in the abdomen. Vascular/Lymphatic: No pathologically enlarged lymph nodes identified. No abdominal aortic aneurysm demonstrated. Other:  No significant abdominal free fluid. Musculoskeletal: No suspicious osseous lesion. Mild  levoconvex curvature of the lumbar spine with associated degenerative change. IMPRESSION: Motion degraded examination limiting sensitivity and specificity. Within this context: 1. Stable post ablation change in the posterior left lower pole kidney without evidence of local recurrence. 2. No evidence of metastatic disease in the abdomen. Electronically Signed   By: Dahlia Bailiff M.D.   On: 11/13/2021 11:44    Labs:  CBC: Recent Labs    03/06/21 1407 04/29/21 1019  WBC 9.9 7.2  HGB 13.7 13.5  HCT 40.4 41.5  PLT 199 174    COAGS: No results for input(s): "INR", "APTT" in the last 8760 hours.  BMP: Recent Labs    03/06/21 1407 04/29/21 1019 07/25/21 1048  NA 135 133* 141  K 4.8 3.6 4.0  CL 101 99 104  CO2 '22 27 30  '$ GLUCOSE 82 102* 86  BUN '16 23 16  '$ CALCIUM 8.8 8.6* 9.1  CREATININE 1.15* 1.15* 1.10  GFRNONAA  --  52*  --     LIVER FUNCTION TESTS: Recent Labs    03/06/21 1407 04/29/21 1019 07/25/21 1048  BILITOT 0.3 0.6 0.5  AST 36 28 27  ALT 33* 22 20  ALKPHOS 74 57 57  PROT 6.3 6.7 6.0  ALBUMIN 4.5 3.7 3.9    TUMOR MARKERS: No results for input(s): "AFPTM", "CEA", "CA199", "CHROMGRNA" in the last 8760 hours.  Assessment and Plan:  Very pleasant 71 year old female with biopsy-proven renal cell carcinoma of the lower pole of the left kidney now just over 2 years status post percutaneous cryoablation.  Surveillance imaging demonstrates no evidence of residual or recurrent disease.  No new lesions in either kidney.  We will continue routine surveillance.  1.)  Repeat MRI with gadolinium contrast and accompanying teleclinic visit in 1 year.  All further follow-up [facility] be performed at [facility].  Electronically Signed: Criselda Peaches 11/22/2021, 3:01 PM   I spent a total of  10 Minutes in remote  clinical consultation, greater than 50% of which was counseling/coordinating care for left renal cell cancer.    Visit type: Audio only (telephone). Audio (no  video) only due to patient's lack of internet/smartphone capability. Alternative for in-person  consultation at [facility], [address] [place], [place], [place]. This visit type was conducted due to national recommendations for restrictions regarding the COVID-19 Pandemic (e.g. social distancing).  This format is felt to be most appropriate for this patient at this time.  All issues noted in this document were discussed and addressed.

## 2021-11-28 ENCOUNTER — Other Ambulatory Visit: Payer: Self-pay | Admitting: Internal Medicine

## 2021-12-04 ENCOUNTER — Other Ambulatory Visit: Payer: Self-pay | Admitting: Neurology

## 2021-12-04 DIAGNOSIS — G4733 Obstructive sleep apnea (adult) (pediatric): Secondary | ICD-10-CM | POA: Diagnosis not present

## 2021-12-04 DIAGNOSIS — I4891 Unspecified atrial fibrillation: Secondary | ICD-10-CM | POA: Diagnosis not present

## 2021-12-04 DIAGNOSIS — C642 Malignant neoplasm of left kidney, except renal pelvis: Secondary | ICD-10-CM | POA: Diagnosis not present

## 2021-12-04 DIAGNOSIS — E7800 Pure hypercholesterolemia, unspecified: Secondary | ICD-10-CM | POA: Diagnosis not present

## 2021-12-04 DIAGNOSIS — I10 Essential (primary) hypertension: Secondary | ICD-10-CM | POA: Diagnosis not present

## 2021-12-20 ENCOUNTER — Other Ambulatory Visit: Payer: Self-pay | Admitting: Internal Medicine

## 2021-12-20 ENCOUNTER — Other Ambulatory Visit: Payer: Self-pay | Admitting: Neurology

## 2021-12-20 ENCOUNTER — Other Ambulatory Visit: Payer: Self-pay | Admitting: Oncology

## 2022-01-03 ENCOUNTER — Other Ambulatory Visit: Payer: Self-pay

## 2022-01-03 ENCOUNTER — Other Ambulatory Visit: Payer: Self-pay | Admitting: Internal Medicine

## 2022-01-03 ENCOUNTER — Telehealth: Payer: Self-pay | Admitting: Internal Medicine

## 2022-01-03 DIAGNOSIS — E7800 Pure hypercholesterolemia, unspecified: Secondary | ICD-10-CM

## 2022-01-03 DIAGNOSIS — R739 Hyperglycemia, unspecified: Secondary | ICD-10-CM

## 2022-01-03 DIAGNOSIS — I480 Paroxysmal atrial fibrillation: Secondary | ICD-10-CM

## 2022-01-03 DIAGNOSIS — I10 Essential (primary) hypertension: Secondary | ICD-10-CM

## 2022-01-03 DIAGNOSIS — G7000 Myasthenia gravis without (acute) exacerbation: Secondary | ICD-10-CM

## 2022-01-03 NOTE — Telephone Encounter (Signed)
Orders placed.

## 2022-01-03 NOTE — Progress Notes (Signed)
Order placed for f/u labs.  

## 2022-01-03 NOTE — Telephone Encounter (Signed)
Patient has a lab appt 01/08/2022, there are no orders in.

## 2022-01-08 ENCOUNTER — Other Ambulatory Visit (INDEPENDENT_AMBULATORY_CARE_PROVIDER_SITE_OTHER): Payer: Medicare Other

## 2022-01-08 DIAGNOSIS — E7800 Pure hypercholesterolemia, unspecified: Secondary | ICD-10-CM | POA: Diagnosis not present

## 2022-01-08 DIAGNOSIS — I10 Essential (primary) hypertension: Secondary | ICD-10-CM

## 2022-01-08 DIAGNOSIS — G7000 Myasthenia gravis without (acute) exacerbation: Secondary | ICD-10-CM | POA: Diagnosis not present

## 2022-01-08 DIAGNOSIS — I480 Paroxysmal atrial fibrillation: Secondary | ICD-10-CM | POA: Diagnosis not present

## 2022-01-08 DIAGNOSIS — R739 Hyperglycemia, unspecified: Secondary | ICD-10-CM | POA: Diagnosis not present

## 2022-01-08 LAB — HEPATIC FUNCTION PANEL
ALT: 18 U/L (ref 0–35)
AST: 24 U/L (ref 0–37)
Albumin: 4.1 g/dL (ref 3.5–5.2)
Alkaline Phosphatase: 68 U/L (ref 39–117)
Bilirubin, Direct: 0.1 mg/dL (ref 0.0–0.3)
Total Bilirubin: 0.4 mg/dL (ref 0.2–1.2)
Total Protein: 6.3 g/dL (ref 6.0–8.3)

## 2022-01-08 LAB — LIPID PANEL
Cholesterol: 238 mg/dL — ABNORMAL HIGH (ref 0–200)
HDL: 60.6 mg/dL (ref >39.00)
LDL Cholesterol: 152 mg/dL — ABNORMAL HIGH (ref 0–99)
NonHDL: 177.83
Total CHOL/HDL Ratio: 4
Triglycerides: 129 mg/dL (ref 0.0–149.0)
VLDL: 25.8 mg/dL (ref 0.0–40.0)

## 2022-01-08 LAB — BASIC METABOLIC PANEL
BUN: 15 mg/dL (ref 6–23)
CO2: 31 mEq/L (ref 19–32)
Calcium: 8.4 mg/dL (ref 8.4–10.5)
Chloride: 100 mEq/L (ref 96–112)
Creatinine, Ser: 0.97 mg/dL (ref 0.40–1.20)
GFR: 59.17 mL/min — ABNORMAL LOW (ref >60.00)
Glucose, Bld: 92 mg/dL (ref 70–99)
Potassium: 3.6 mEq/L (ref 3.5–5.1)
Sodium: 138 mEq/L (ref 135–145)

## 2022-01-08 LAB — CBC WITH DIFFERENTIAL/PLATELET
Basophils Absolute: 0.1 10*3/uL (ref 0.0–0.1)
Basophils Relative: 0.9 % (ref 0.0–3.0)
Eosinophils Absolute: 0.1 10*3/uL (ref 0.0–0.7)
Eosinophils Relative: 1.2 % (ref 0.0–5.0)
HCT: 40.3 % (ref 36.0–46.0)
Hemoglobin: 13.3 g/dL (ref 12.0–15.0)
Lymphocytes Relative: 14.7 % (ref 12.0–46.0)
Lymphs Abs: 1 10*3/uL (ref 0.7–4.0)
MCHC: 32.9 g/dL (ref 30.0–36.0)
MCV: 91.4 fl (ref 78.0–100.0)
Monocytes Absolute: 0.4 10*3/uL (ref 0.1–1.0)
Monocytes Relative: 6.5 % (ref 3.0–12.0)
Neutro Abs: 5.3 10*3/uL (ref 1.4–7.7)
Neutrophils Relative %: 76.7 % (ref 43.0–77.0)
Platelets: 232 10*3/uL (ref 150.0–400.0)
RBC: 4.41 Mil/uL (ref 3.87–5.11)
RDW: 18.5 % — ABNORMAL HIGH (ref 11.5–15.5)
WBC: 6.9 10*3/uL (ref 4.0–10.5)

## 2022-01-08 LAB — TSH: TSH: 4.71 u[IU]/mL (ref 0.35–5.50)

## 2022-01-08 LAB — HEMOGLOBIN A1C: Hgb A1c MFr Bld: 5.6 % (ref 4.6–6.5)

## 2022-01-13 NOTE — Patient Instructions (Incomplete)
Below is our plan:  We will continue mestinon, methotrexate, folic acid and DDVAP as prescribed. We will help you schedule sleep study. Please listen out for a call from sleep lab.   Please make sure you are staying well hydrated. I recommend 50-60 ounces daily. Well balanced diet and regular exercise encouraged. Consistent sleep schedule with 6-8 hours recommended.   Please continue follow up with care team as directed.   Follow up with Dr Vickey Huger in 6 months   You may receive a survey regarding today's visit. I encourage you to leave honest feed back as I do use this information to improve patient care. Thank you for seeing me today!

## 2022-01-13 NOTE — Progress Notes (Unsigned)
PATIENT: Madison Burns DOB: [birth year]  REASON FOR VISIT: follow up HISTORY FROM: patient  No chief complaint on file.    HISTORY OF PRESENT ILLNESS:  01/13/22 ALL: Niketa returns for follow up for MG and questionable OSA. She was last seen by Dr Brett Fairy 08/2021. In lab PSA ordered as previous HST had not shown any significant sleep apnea. Sister was concerned about witnessed snoring, apneas and enuresis. She was started on DDAVP nasal spray. Since,   She continues mestinon '60mg'$  TID, methotrexate '10mg'$  weekly and folic acid '2mg'$  daily.   09/04/2021 CD: Madison Burns is a 71 y.o. female with mental retardation, living with her sister.Here for 6 month MG and sleep f/u. Pt continues to snore and has started to wet the bed ever since her last fall, 2/19. Pt is such a light sleeper, currently taking risperidone. Had 3 falls in January and since has been wetting the bed.  Miss Leyana has been diagnosed with renal cell cancer and began having enuresis- her Myasthenia has taken a turn for the worse. She is presenting with ptosis , neck weakness and left shoulder slump. She is no longer able to walk unassisted, presents stooped. Her voice is hoarse. Her mood is good, she lost weight , BMI 36.73 kg/m2.  She completed her cancer treatments. She brought me another drawing- I have a collection of the teddy brea drawings since 15 years ago. She is  awaiting a CT of the kidney or MRI.     Her screening for apnea by HST on 10-05-2020 was negative: Calculated pAHI (per hour):   The calculated AHI was only 3.5/h there was no breakdown in rem sleep and non-REM sleep possible.  No data of sleep position or snoring were provided either.                                                                 Oxygen Saturation Statistics:       O2 Saturation Range (%):   Oxygen saturation ranged between a nadir of 74% and a maximum saturation of 97% with a mean oxygenation at 93% SPO2. Time spent under 89% oxygen  saturation amounted to 5.2 minutes or 1.4% of sleep time.    I like for Brexlee to have an in lab study- she has new findings ENURESIS< and she has lost weight, and she snores as witnessed by her sister- yet the HST did not reflect this.   03/06/2021 ALL: Madison Burns returns for follow up for MG and OSA. She was last seen by Dr Brett Fairy and continuing to have more difficulty with progressing MG. She continued mestinon and restarted MTX '10mg'$  weekly with folic acid. Since, she is doing well. She seems to be breathing well. She is on Tessalon Pearls for a dry cough. No significant changes in muscle weakness. She has good days and bad days. She did have trouble getting left leg in the [place], today.   No seizures. Only event in 2012. Not on AED.   She had a fall 02/27/2021 while pushing a trash can. She landed on her right knee and hands, she did hit her face. She was diagnosed with left 3rd an 4th digit tuft fractures and maxillary ridge fracture. Hand lac sutured and abx given. She  was advised to follow up close with dentistry for partial bridge. She will see PCP for suture removal. Gait is usually stable. She does not use an assistive device.   Last HST showed very mild OSA. CPAP was discontinued. Her sister reports that she continues to snore and has apneic events. She wakes herself up snoring. She is more sleepy during the day. Sister feels that last HST was not a "good study" and concerned that Allix still has sleep apnea.   02/22/2020 ALL: Madison Burns returns today for follow up of MG. She was last seen by Dr Dohmeier in 10/2019 in the setting of MG exacerbation d/t caner treatments. Mestinon was continued TID. Mtx '10mg'$  weekly resumed and prednisone taper given. Her sister, [name], reports that she was nearly back to baseline over night. She is doing very well today. She is tolerating medications well. She continue to follow up closely with oncology and plans to have office visit this month.  She continues CPAP  nightly and is doing well. No concerns today.   History (copied from previous notes)  11/15/2019 CD: Madison Burns is a 71 y.o. female with mental retardation, living with her sister. [name] has now been diagnosed with renal cell cancer and her Myasthenia has taken a turn for the worse. She is presenting with ptosis , neck weakness and left shoulder slump. She is no longer able to walk unassisted, presents stooped. Her voice is hoarse.  She was last seen by [name] , NP on 09-06-2019.  She also has developed wheezing in the meantime but she last saw Madison Burns she presented much less impaired.  In the meantime which she had called our office.  She has completed her chemoradiation therapy and she is certainly has a weaker immune system she also developed pneumonia and sepsis following her cancer treatment and surgery.  She was seen at the [facility] room just 10 days ago on 9-17 21 with breathing difficulties shortness of breath thought to be due to left-sided pneumonia and possible myasthenia gravis exacerbation.  She underwent treatment with antibiotics and IVIG and ultimately was discharged after week.  She gets short of breath now with exertion still, sometimes she is wheezing. She is sleeping in a hospital bed, and she cannot breath  easily- has orthopnea. This bed is adjustable and helps her shortness of breath.    Since the patient was discharged from the hospital she has resumed compliant use of her CPAP and would now be 100% for the time since discharge.  Average use at time 9 hours 2 minutes, she is above average sleepy the CPAP is set to 5 cmH2O with 3 cm EPR and her AHI is 2.2 which is an excellent resolution of this otherwise mild apnea which was more classified as a high apnea.  I am worried that she is so weak at this time that she may not participate well in physical therapy or gait stabilization but I will wait if she has regained some of her strength and  respiratory capacity in the next 3 months so that we could do some physical therapy as well.   09/06/2019 ALL: Madison Burns is a 71 y.o. female here today for follow up for MG. She continues mestinon TID. Methotrexate discontinued when she started chemo. She feels that symptoms are stable. Her sister has noted once or twice where her left eye will seem to be "droopy" at night when she is tired. She did have several episodes  where she got strangled during chemo therapy. She sometimes will take larger bites of food and will eat fast. Her sister is helping to monitor this. Renal cell- tumor was "frozen" with urology. Thymoma- had chemo and radiation. Has a PET scan tomorrow. She seems more interactive since. She is able to answer more complicated questions. No falls. No vision changes/double vision. She has had an occasional headache, aborted with tylenol. She has had mild tingling of her feet following chemo but does not feel it is consistent or worsening. Sleep apnea well managed on CPAP. Has not had a seizure in 9 years, not on AED.   HISTORY: (copied from Dr Edwena Felty note on 03/09/2019)  MAIA HANDA is a 71 y.o. female with mental retardation, living with her sister. Shilo has been on medication for myasthenia gravis, she had lost some weight since eating mostly at home during the pandemic.  She still has a good appetite no trouble swallowing she had no falls and no changes in gait.  No taking any seizure medicine, she has continued Mestinon and methotrexate for myasthenia gravis and it was felt that she was fairly stable.   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 This would be her third tumor.     IMPRESSION: 1. Apparent subtle 3.2 cm homogeneously hypoenhancing lesion in the posterior interpolar left kidney. Imaging features raise concern for papillary type renal cell carcinoma. Abdominal MRI with and without contrast recommended to further evaluate. 2. Bilateral adrenal nodules. These could also be further assessed at the time of follow-up MRI. 3. No specific findings to explain the patient's history of periumbilical pain. 4. Tiny gas bubble identified in the urinary bladder, presumably from recent instrumentation. In the absence of instrumentation, infection would be a consideration. 5. Insert [name] 6. Left colonic diverticulosis without diverticulitis.   She has been followed here by me for OSA- for obstructive sleep apnea and has been a very compliant CPAP user V visit for sleep apnea is not to do until September 2021 with Lupe Handley.      Senai Ramnath, 11-03-2018 here today for follow up of MG, seizure, and OSA on CPAP. She is here today with her sister , [name] who aids in history. She continues mestinon and methotrexate for MG and feels symptoms are fairly stable. No vision changes, no double vision. No new or worsening weakness. She has lost some weight as they are eating at home more since pandemic. She has a good appetite. No trouble swallowing. No changes in gait or falls. She is able to perform ADL's. She lives with her sister. She has not taken seizure medication in many years with no seizure activity noted. No adverse effects or worsening symptoms with decreased dose of methotrexate.    Compliance report dated 10/03/2018 through 11/01/2018 reveals that she is using CPAP nightly for compliance 100%.  She is using CPAP greater than 4 hours every night.  Average usage is 9 hours and 57 minutes.  AHI is 1.9 on 5 cm of water.  There is no significant leak.    REVIEW OF SYSTEMS: Out of a complete 14 system  review of symptoms, the patient complains only of the following symptoms,  headaches, tingling of feet, cough, weakness, snoring, daytime sleepiness and all other reviewed systems are negative.  ESS: 3  ALLERGIES: No Known Allergies  HOME MEDICATIONS: Outpatient Medications Prior to Visit  Medication Sig Dispense Refill   acetaminophen (TYLENOL) 325 MG tablet Take 2 tablets (650 mg total) by mouth every 6 (six) hours as needed for mild pain (or Fever >/= 101).     amiodarone (PACERONE) 200 MG tablet Take 1 tablet (200 mg total) by mouth daily. 14 tablet 0   Ascorbic Acid (VITAMIN C) 1000 MG tablet Take 1,000 mg by mouth at bedtime.     B Complex-C (B-COMPLEX WITH VITAMIN C) tablet Take 1 tablet by mouth every evening.     benzonatate (TESSALON) 100 MG capsule TAKE 1 CAPSULE BY MOUTH TWICE DAILY AS NEEDED FOR COUGH 90 capsule 0   Biotin 10000 MCG TABS Take 10,000 mcg by mouth daily at 2 PM. (1430)     Calcium-Phosphorus-Vitamin D (CITRACAL +D3 PO) Take 1 tablet by mouth daily.     desmopressin (DDAVP NASAL) 0.01 % solution Place 1 spray (10 mcg total) into the nose 2 (two) times daily. 5 mL 12   diltiazem (TIAZAC) 180 MG 24 hr capsule Take by mouth.     DULoxetine (CYMBALTA) 30 MG capsule TAKE 1 CAPSULE BY MOUTH ONCE DAILY 30 capsule 2   folic acid (FOLVITE) 1 MG tablet TAKE 2 TABLETS BY MOUTH ONCE DAILY 60 tablet 11   Garlic 4010 MG CAPS Take 1,000 mg by mouth in the morning, at noon, and at bedtime. (0800, 1430, 2000)     levothyroxine (SYNTHROID) 75 MCG tablet TAKE 1 TABLET BY MOUTH ONCE DAILY ON AN EMPTY STOMACH. WAIT 30 MINUTES BEFORE TAKING OTHER MEDS. 90 tablet 1   methotrexate (RHEUMATREX) 2.5 MG tablet TAKE 4 TABLETS BY MOUTH ONCE A WEEK WITHFOLIC ACID 16 tablet 5   pantoprazole (PROTONIX) 40 MG tablet Take 40 mg by mouth daily.      Probiotic Product (PROBIOTIC MULTI-ENZYME PO) Take 1 capsule by mouth in the morning and at bedtime. Probiotic Multi-Enzyme Digestive Formula     pyridostigmine (MESTINON) 60 MG tablet TAKE 1 TABLET BY MOUTH 3 TIMES DAILY 90  tablet 3   risperiDONE (RISPERDAL) 1 MG tablet Take 1 mg by mouth at bedtime.      tolterodine (DETROL LA) 4 MG 24 hr capsule TAKE 1 CAPSULE BY MOUTH ONCE EVERY MORNING 90 capsule 1   vitamin E 180 MG (400 UNITS) capsule Take 400 Units by mouth daily at 2 PM. 1430     No facility-administered medications prior to visit.    PAST MEDICAL HISTORY: Past Medical History:  Diagnosis Date   Cancer of kidney ([place]) 2725   Complication of anesthesia    Myasthenia gravis   GERD (gastroesophageal reflux disease)    History of seizure disorder    Hypercholesterolemia    Hypertension    no longer has it. wt loss   Hypothyroidism    Irritable bowel syndrome    Melanoma ([place]) 2017   Melanoma on neck   Myasthenia gravis ([place]) 2011   OSA on CPAP    Thymoma, malignant ([place])     PAST SURGICAL HISTORY: Past Surgical History:  Procedure Laterality Date   ABDOMINAL HYSTERECTOMY  1990   APPENDECTOMY  1999   CHOLECYSTECTOMY  2013   COLONOSCOPY WITH PROPOFOL N/A 06/30/2017   Procedure: COLONOSCOPY WITH PROPOFOL;  Surgeon:  Toledo, Benay Pike, MD;  Location: [facility] ENDOSCOPY;  Service: Gastroenterology;  Laterality: N/A;   CYSTOSCOPY W/ URETERAL STENT PLACEMENT Left 08/17/2019   Procedure: CYSTOSCOPY WITH RETROGRADE PYELOGRAM/URETERAL STENT PLACEMENT;  Surgeon: Alexis Frock, MD;  Location: [facility];  Service: Urology;  Laterality: Left;  30 MINS   ESOPHAGOGASTRODUODENOSCOPY (EGD) WITH PROPOFOL N/A 06/30/2017   Procedure: ESOPHAGOGASTRODUODENOSCOPY (EGD) WITH PROPOFOL;  Surgeon: Toledo, Benay Pike, MD;  Location: [facility] ENDOSCOPY;  Service: Gastroenterology;  Laterality: N/A;   IR RADIOLOGIST EVAL & MGMT  06/28/2019   IR RADIOLOGIST EVAL & MGMT  09/14/2019   IR RADIOLOGIST EVAL & MGMT  03/28/2020   IR RADIOLOGIST EVAL & MGMT  10/10/2020   IR RADIOLOGIST EVAL & MGMT  11/22/2021   RADIOFREQUENCY ABLATION Left 08/17/2019   Procedure: LEFT RENAL CRYO ABLATION;  Surgeon: Jacqulynn Cadet, MD;  Location: [facility];   Service: Anesthesiology;  Laterality: Left;   TONSILLECTOMY  1959    FAMILY HISTORY: Family History  Problem Relation Age of Onset   Colon cancer Maternal Grandfather        stomach/colon   Heart disease Father        myocardial infarction   Hyperlipidemia Father    Hyperlipidemia Sister    Diabetes Sister    Bone cancer Other        cousin   Alzheimer's disease Mother        maternal aunts   Skin cancer Mother    Myasthenia gravis Brother        ocular   Skin cancer Brother    Stroke Paternal Grandfather    Breast cancer Neg Hx     SOCIAL HISTORY: Social History   Socioeconomic History   Marital status: Single    Spouse name: Not on file   Number of children: 0   Years of education: Special Ed   Highest education level: Not on file  Occupational History   Occupation: Unemployed  Tobacco Use   Smoking status: Never   Smokeless tobacco: Never  Vaping Use   Vaping Use: Never used  Substance and Sexual Activity   Alcohol use: No    Alcohol/week: 0.0 standard drinks of alcohol   Drug use: No   Sexual activity: Not Currently  Other Topics Concern   Not on file  Social History Narrative   06/23/17 lives with sister, [name]   Regular exercise-no   Caffeine Use-yes   Social Determinants of Health   Financial Resource Strain: Not on file  Food Insecurity: Not on file  Transportation Needs: Not on file  Physical Activity: Not on file  Stress: Not on file  Social Connections: Not on file  Intimate Partner Violence: Not on file      PHYSICAL EXAM  There were no vitals filed for this visit.   There is no height or weight on file to calculate BMI.  Generalized: Well developed, in no acute distress  Cardiology: normal rate and rhythm, no murmur noted Respiratory: clear to auscultation bilaterally  Neurological examination  Mentation: Alert, not fully oriented but able to assist with some history taking, developmentally delayed.  Cranial nerve II-XII:  Pupils were equal round reactive to light. No ptosis noted. Extraocular movements were full, visual field were full on confrontational test. Facial sensation and strength were normal.  Head turning and shoulder shrug  were normal and symmetric. Motor: The motor testing reveals 5 over 5 strength of all 4 extremities. Good symmetric motor tone is noted throughout.  Gait and station:  Gait is normal. Very mildly stooped posture, tandem not attempted.  DTR: symmetric bilaterally, brisk in bilateral upper extremities.    DIAGNOSTIC DATA (LABS, IMAGING, TESTING) - I reviewed patient records, labs, notes, testing and imaging myself where available.      No data to display           Lab Results  Component Value Date   WBC 6.9 01/08/2022   HGB 13.3 01/08/2022   HCT 40.3 01/08/2022   MCV 91.4 01/08/2022   PLT 232.0 01/08/2022      Component Value Date/Time   NA 138 01/08/2022 0920   NA 135 03/06/2021 1407   NA 137 02/21/2012 1828   K 3.6 01/08/2022 0920   K 4.3 02/21/2012 1828   CL 100 01/08/2022 0920   CL 104 02/21/2012 1828   CO2 31 01/08/2022 0920   CO2 25 02/21/2012 1828   GLUCOSE 92 01/08/2022 0920   GLUCOSE 145 (H) 02/21/2012 1828   BUN 15 01/08/2022 0920   BUN 16 03/06/2021 1407   BUN 8 02/21/2012 1828   CREATININE 0.97 01/08/2022 0920   CREATININE 1.14 02/21/2012 1828   CALCIUM 8.4 01/08/2022 0920   CALCIUM 9.0 02/21/2012 1828   PROT 6.3 01/08/2022 0920   PROT 6.3 03/06/2021 1407   PROT 6.4 04/25/2011 0854   ALBUMIN 4.1 01/08/2022 0920   ALBUMIN 4.5 03/06/2021 1407   ALBUMIN 3.4 04/25/2011 0854   AST 24 01/08/2022 0920   AST 18 04/25/2011 0854   ALT 18 01/08/2022 0920   ALT 25 04/25/2011 0854   ALKPHOS 68 01/08/2022 0920   ALKPHOS 45 (L) 04/25/2011 0854   BILITOT 0.4 01/08/2022 0920   BILITOT 0.3 03/06/2021 1407   BILITOT 0.4 04/25/2011 0854   GFRNONAA 52 (L) 04/29/2021 1019   GFRNONAA 52 (L) 02/21/2012 1828   GFRAA >60 11/04/2019 1725   GFRAA >60 02/21/2012  1828   Lab Results  Component Value Date   CHOL 238 (H) 01/08/2022   HDL 60.60 01/08/2022   LDLCALC 152 (H) 01/08/2022   LDLDIRECT 122.0 12/14/2018   TRIG 129.0 01/08/2022   CHOLHDL 4 01/08/2022   Lab Results  Component Value Date   HGBA1C 5.6 01/08/2022   No results found for: "VITAMINB12" Lab Results  Component Value Date   TSH 4.71 01/08/2022       ASSESSMENT AND PLAN 71 y.o. year old female  has a past medical history of Cancer of kidney ([place]) (7048), Complication of anesthesia, GERD (gastroesophageal reflux disease), History of seizure disorder, Hypercholesterolemia, Hypertension, Hypothyroidism, Irritable bowel syndrome, Melanoma ([place]) (2017), Myasthenia gravis ([place]) (2011), OSA on CPAP, and Thymoma, malignant ([place]). here with   No diagnosis found.    [name] is doing well today. She continues mestinon '60mg'$  TID, methotrexate '10mg'$  weekly and folic acid '2mg'$  daily. She is tolerating medications well.  We will continue current treatment plan. I will update labs. No recent seizures. Last sleep study showed AHI 3.5/hr. No breakdown of REM sleep or sleep position noted. Based on results Dr Dohmeier recommended to discontinue CPAP. Her sister feels this was a poor study due to difficulty getting the leads to stick to her skin. She continues to snore and have witnessed apnea. We will discuss next steps with Dr Brett Fairy. Would like to obtain lab PSG, if possible due to complexity of her medical history. She was encouraged to continue healthy lifestyle habits. She will continue close follow up with her care team. Follow up with PCP for suture removal. Fall  precautions advised. She will follow up with Dr Brett Fairy in 6 months. Will plan to alternate follow up visits with NP and MD.    No orders of the defined types were placed in this encounter.    No orders of the defined types were placed in this encounter.      [name], FNP-C 01/13/2022, 2:53 PM [facility] [address], [place], [place] [address] [phone]

## 2022-01-14 ENCOUNTER — Ambulatory Visit (INDEPENDENT_AMBULATORY_CARE_PROVIDER_SITE_OTHER): Payer: Medicare Other | Admitting: Family Medicine

## 2022-01-14 ENCOUNTER — Encounter: Payer: Self-pay | Admitting: Family Medicine

## 2022-01-14 VITALS — BP 152/90 | HR 80 | Ht 64.0 in | Wt 225.8 lb

## 2022-01-14 DIAGNOSIS — F73 Profound intellectual disabilities: Secondary | ICD-10-CM

## 2022-01-14 DIAGNOSIS — G7000 Myasthenia gravis without (acute) exacerbation: Secondary | ICD-10-CM

## 2022-01-14 DIAGNOSIS — G40309 Generalized idiopathic epilepsy and epileptic syndromes, not intractable, without status epilepticus: Secondary | ICD-10-CM | POA: Diagnosis not present

## 2022-01-14 MED ORDER — FOLIC ACID 1 MG PO TABS: ORAL_TABLET | ORAL | 11 refills | Status: AC

## 2022-01-15 ENCOUNTER — Encounter: Payer: Self-pay | Admitting: Internal Medicine

## 2022-01-15 ENCOUNTER — Ambulatory Visit (INDEPENDENT_AMBULATORY_CARE_PROVIDER_SITE_OTHER): Payer: Medicare Other | Admitting: Internal Medicine

## 2022-01-15 VITALS — BP 122/78 | HR 68 | Temp 98.4°F | Resp 17 | Ht 64.0 in | Wt 225.4 lb

## 2022-01-15 DIAGNOSIS — G4733 Obstructive sleep apnea (adult) (pediatric): Secondary | ICD-10-CM

## 2022-01-15 DIAGNOSIS — D6481 Anemia due to antineoplastic chemotherapy: Secondary | ICD-10-CM | POA: Diagnosis not present

## 2022-01-15 DIAGNOSIS — I10 Essential (primary) hypertension: Secondary | ICD-10-CM | POA: Diagnosis not present

## 2022-01-15 DIAGNOSIS — D696 Thrombocytopenia, unspecified: Secondary | ICD-10-CM

## 2022-01-15 DIAGNOSIS — E7800 Pure hypercholesterolemia, unspecified: Secondary | ICD-10-CM

## 2022-01-15 DIAGNOSIS — G7000 Myasthenia gravis without (acute) exacerbation: Secondary | ICD-10-CM | POA: Diagnosis not present

## 2022-01-15 DIAGNOSIS — Z23 Encounter for immunization: Secondary | ICD-10-CM

## 2022-01-15 DIAGNOSIS — E039 Hypothyroidism, unspecified: Secondary | ICD-10-CM

## 2022-01-15 DIAGNOSIS — I480 Paroxysmal atrial fibrillation: Secondary | ICD-10-CM

## 2022-01-15 DIAGNOSIS — R739 Hyperglycemia, unspecified: Secondary | ICD-10-CM | POA: Diagnosis not present

## 2022-01-15 DIAGNOSIS — E2839 Other primary ovarian failure: Secondary | ICD-10-CM

## 2022-01-15 DIAGNOSIS — F39 Unspecified mood [affective] disorder: Secondary | ICD-10-CM

## 2022-01-15 DIAGNOSIS — G40909 Epilepsy, unspecified, not intractable, without status epilepticus: Secondary | ICD-10-CM

## 2022-01-15 DIAGNOSIS — C642 Malignant neoplasm of left kidney, except renal pelvis: Secondary | ICD-10-CM

## 2022-01-15 DIAGNOSIS — C649 Malignant neoplasm of unspecified kidney, except renal pelvis: Secondary | ICD-10-CM

## 2022-01-15 DIAGNOSIS — T451X5A Adverse effect of antineoplastic and immunosuppressive drugs, initial encounter: Secondary | ICD-10-CM

## 2022-01-15 DIAGNOSIS — Z0000 Encounter for general adult medical examination without abnormal findings: Secondary | ICD-10-CM | POA: Diagnosis not present

## 2022-01-15 NOTE — Assessment & Plan Note (Signed)
Physical today 01/15/22.

## 2022-01-15 NOTE — Progress Notes (Signed)
Patient ID: Madison Burns, female   DOB: 11-20-1950, 71 y.o.   MRN: [id number]   Subjective:    Patient ID: Madison Burns, female    DOB: Apr 07, 1950, 71 y.o.   MRN: [id number]   Patient here for scheduled follow up.  [name]   HPI Here to follow up regarding hypercholesterolemia, MG and hypertension.  She is accompanied by her sister.  History obtained from both of them.  Saw Dr Ubaldo Glassing 12/04/21 - continue amiodarone and cardizem.  Stable.  No chest pain reported.  Breathing stable.  Had covid in August.  Reports no residual problems.  Saw neurology 01/14/22 - recommended continue mestinon, MTX and folic acid.  Planning for sleep study.     Past Medical History:  Diagnosis Date   Cancer of kidney ([place]) 5366   Complication of anesthesia    Myasthenia gravis   GERD (gastroesophageal reflux disease)    History of seizure disorder    Hypercholesterolemia    Hypertension    no longer has it. wt loss   Hypothyroidism    Irritable bowel syndrome    Melanoma ([place]) 2017   Melanoma on neck   Myasthenia gravis ([place]) 2011   OSA on CPAP    Thymoma, malignant ([place])    Past Surgical History:  Procedure Laterality Date   [place]  2013   COLONOSCOPY WITH PROPOFOL N/A 06/30/2017   Procedure: COLONOSCOPY WITH PROPOFOL;  Surgeon: Toledo, Benay Pike, MD;  Location: [facility];  Service: Gastroenterology;  Laterality: N/A;   CYSTOSCOPY W/ URETERAL STENT PLACEMENT Left 08/17/2019   Procedure: CYSTOSCOPY WITH RETROGRADE PYELOGRAM/URETERAL STENT PLACEMENT;  Surgeon: Alexis Frock, MD;  Location: [facility];  Service: Urology;  Laterality: Left;  30 MINS   ESOPHAGOGASTRODUODENOSCOPY (EGD) WITH PROPOFOL N/A 06/30/2017   Procedure: ESOPHAGOGASTRODUODENOSCOPY (EGD) WITH PROPOFOL;  Surgeon: Toledo, Benay Pike, MD;  Location: [facility];  Service: Gastroenterology;  Laterality: N/A;   IR RADIOLOGIST EVAL & MGMT  06/28/2019   IR RADIOLOGIST EVAL & MGMT   09/14/2019   IR RADIOLOGIST EVAL & MGMT  03/28/2020   IR RADIOLOGIST EVAL & MGMT  10/10/2020   IR RADIOLOGIST EVAL & MGMT  11/22/2021   RADIOFREQUENCY ABLATION Left 08/17/2019   Procedure: LEFT RENAL CRYO ABLATION;  Surgeon: Jacqulynn Cadet, MD;  [facility];  Service: Anesthesiology;  Laterality: Left;   TONSILLECTOMY  1959   Family History  Problem Relation Age of Onset   Colon cancer Maternal Grandfather        stomach/colon   Heart disease Father        myocardial infarction   Hyperlipidemia Father    Hyperlipidemia Sister    Diabetes Sister    Bone cancer Other        cousin   Alzheimer's disease Mother        maternal aunts   Skin cancer Mother    Myasthenia gravis Brother        ocular   Skin cancer Brother    Stroke Paternal Grandfather    Breast cancer Neg Hx    Social History   Socioeconomic History   Marital status: Single    Spouse name: Not on file   Number of children: 0   Years of education: Special Ed   Highest education level: Not on file  Occupational History   Occupation: Unemployed  Tobacco Use   Smoking status: Never   Smokeless tobacco: Never  Vaping Use  Vaping Use: Never used  Substance and Sexual Activity   Alcohol use: No    Alcohol/week: 0.0 standard drinks of alcohol   Drug use: No   Sexual activity: Not Currently  Other Topics Concern   Not on file  Social History Narrative   06/23/17 lives with sister, Madison Burns   Regular exercise-no   Caffeine Use-yes   Social Determinants of Health   Financial Resource Strain: Not on file  Food Insecurity: Not on file  Transportation Needs: Not on file  Physical Activity: Not on file  Stress: Not on file  Social Connections: Not on file     Review of Systems  Constitutional:  Negative for appetite change and unexpected weight change.  HENT:  Negative for congestion and sinus pressure.   Respiratory:  Negative for cough, chest tightness and shortness of breath.   Cardiovascular:   Negative for chest pain and palpitations.       No increased swelling.   Gastrointestinal:  Negative for abdominal pain, diarrhea, nausea and vomiting.  Genitourinary:  Negative for difficulty urinating and dysuria.  Musculoskeletal:  Negative for joint swelling and myalgias.  Skin:  Negative for color change and rash.  Neurological:  Negative for dizziness and headaches.  Psychiatric/Behavioral:  Negative for agitation and dysphoric mood.        Objective:     BP 122/78   Pulse 68   Temp 98.4 F (36.9 C) (Temporal)   Resp 17   Ht _0  (1.626 m)   Wt 225 lb 6.4 oz (102.2 kg)   SpO2 98%   BMI 38.69 kg/m  Wt Readings from Last 3 Encounters:  01/15/22 225 lb 6.4 oz (102.2 kg)  01/14/22 225 lb 12.8 oz (102.4 kg)  11/11/21 218 lb (98.9 kg)    Physical Exam Vitals reviewed.  Constitutional:      General: She is not in acute distress.    Appearance: Normal appearance.  HENT:     Head: Normocephalic and atraumatic.     Right Ear: External ear normal.     Left Ear: External ear normal.  Eyes:     General: No scleral icterus.       Right eye: No discharge.        Left eye: No discharge.     Conjunctiva/sclera: Conjunctivae normal.  Neck:     Thyroid: No thyromegaly.  Cardiovascular:     Rate and Rhythm: Normal rate and regular rhythm.  Pulmonary:     Effort: No respiratory distress.     Breath sounds: Normal breath sounds. No wheezing.  Abdominal:     General: Bowel sounds are normal.     Palpations: Abdomen is soft.     Tenderness: There is no abdominal tenderness.  Musculoskeletal:        General: No swelling or tenderness.     Cervical back: Neck supple. No tenderness.  Lymphadenopathy:     Cervical: No cervical adenopathy.  Skin:    Findings: No erythema or rash.  Neurological:     Mental Status: She is alert.  Psychiatric:        Mood and Affect: Mood normal.        Behavior: Behavior normal.      Outpatient Encounter Medications as of 01/15/2022   Medication Sig   acetaminophen (TYLENOL) 325 MG tablet Take 2 tablets (650 mg total) by mouth every 6 (six) hours as needed for mild pain (or Fever >/= 101).   amiodarone (PACERONE) 200 MG tablet  Take 1 tablet (200 mg total) by mouth daily.   Ascorbic Acid (VITAMIN C) 1000 MG tablet Take 1,000 mg by mouth at bedtime.   B Complex-C (B-COMPLEX WITH VITAMIN C) tablet Take 1 tablet by mouth every evening.   benzonatate (TESSALON) 100 MG capsule TAKE 1 CAPSULE BY MOUTH TWICE DAILY AS NEEDED FOR COUGH   Biotin 10000 MCG TABS Take 10,000 mcg by mouth daily at 2 PM. (1430)   Calcium-Phosphorus-Vitamin D (CITRACAL +D3 PO) Take 1 tablet by mouth daily.   desmopressin (DDAVP NASAL) 0.01 % solution Place 1 spray (10 mcg total) into the nose 2 (two) times daily.   diltiazem (CARDIZEM CD) 180 MG 24 hr capsule Take by mouth.   DULoxetine (CYMBALTA) 30 MG capsule TAKE 1 CAPSULE BY MOUTH ONCE DAILY   folic acid (FOLVITE) 1 MG tablet TAKE 2 TABLETS BY MOUTH ONCE DAILY   Garlic 7425 MG CAPS Take 1,000 mg by mouth in the morning, at noon, and at bedtime. (0800, 1430, 2000)   levothyroxine (SYNTHROID) 75 MCG tablet TAKE 1 TABLET BY MOUTH ONCE DAILY ON AN EMPTY STOMACH. WAIT 30 MINUTES BEFORE TAKING OTHER MEDS.   methotrexate (RHEUMATREX) 2.5 MG tablet TAKE 4 TABLETS BY MOUTH ONCE A WEEK WITHFOLIC ACID   pantoprazole (PROTONIX) 40 MG tablet Take 40 mg by mouth daily.    Probiotic Product (PROBIOTIC MULTI-ENZYME PO) Take 1 capsule by mouth in the morning and at bedtime. Probiotic Multi-Enzyme Digestive Formula   pyridostigmine (MESTINON) 60 MG tablet TAKE 1 TABLET BY MOUTH 3 TIMES DAILY   risperiDONE (RISPERDAL) 1 MG tablet Take 1 mg by mouth at bedtime.    tolterodine (DETROL Burns) 4 MG 24 hr capsule TAKE 1 CAPSULE BY MOUTH ONCE EVERY MORNING   vitamin E 180 MG (400 UNITS) capsule Take 400 Units by mouth daily at 2 PM. 1430   [DISCONTINUED] diltiazem (TIAZAC) 180 MG 24 hr capsule Take by mouth.   No  facility-administered encounter medications on file as of 01/15/2022.     Lab Results  Component Value Date   WBC 6.9 01/08/2022   HGB 13.3 01/08/2022   HCT 40.3 01/08/2022   PLT 232.0 01/08/2022   GLUCOSE 92 01/08/2022   CHOL 238 (H) 01/08/2022   TRIG 129.0 01/08/2022   HDL 60.60 01/08/2022   LDLDIRECT 122.0 12/14/2018   LDLCALC 152 (H) 01/08/2022   ALT 18 01/08/2022   AST 24 01/08/2022   NA 138 01/08/2022   K 3.6 01/08/2022   CL 100 01/08/2022   CREATININE 0.97 01/08/2022   BUN 15 01/08/2022   CO2 31 01/08/2022   TSH 4.71 01/08/2022   INR 1.0 10/23/2019   HGBA1C 5.6 01/08/2022    IR Radiologist Eval & Mgmt  Result Date: 11/22/2021 Please refer to notes tab for details about interventional procedure. (Op Note)      Assessment & Plan:   Problem List Items Addressed This Visit     Anemia due to chemotherapy for renal cell cancer treated with erythropoietin ([place])    Followed by oncology.       Atrial fibrillation [facility])    Seeing cardiology.  On amiodarone and cardizem.  Appears to be in SR.  Follow.       Relevant Medications   diltiazem (CARDIZEM CD) 180 MG 24 hr capsule   Healthcare maintenance    Physical today 01/15/22.       Hypercholesterolemia    Declines cholesterol medication.  Discussed again today. Low cholesterol diet and exercise.  Follow lipid panel.       Relevant Medications   diltiazem (CARDIZEM CD) 180 MG 24 hr capsule   Other Relevant Orders   Hepatic function panel   Lipid panel   Hyperglycemia    Low carb diet and exercise.  Follow met b and a1c.   Lab Results  Component Value Date   HGBA1C 5.6 01/08/2022       Relevant Orders   Hemoglobin A1c   Hypertension    Blood pressure has been doing well. On oral cardizem.  Follow pressures and metabolic panel.       Relevant Medications   diltiazem (CARDIZEM CD) 180 MG 24 hr capsule   Other Relevant Orders   Basic metabolic panel   Hypothyroidism    On thyroid replacement.   Follow tsh.       MG (myasthenia gravis) ([place])    Followed by neurology.  Stable.       Mood disorder [facility])    Psychiatry.  Continue risperdal.        OSA (obstructive sleep apnea)    Planning for sleep study. Seeing neurology.       Renal cell cancer, left [facility])    Saw urology (Dr Erlene Quan) - 05/2021 - recommended f/u MRI in one year.       Seizure disorder ([place])    Followed by neurology.        Thrombocytopenia ([place])    Followed by oncology.       Other Visit Diagnoses     Estrogen deficiency    -  Primary   Relevant Orders   DG Bone Density   Need for influenza vaccination       Relevant Orders   Flu Vaccine QUAD High Dose(Fluad) (Completed)   Need for vaccination against Streptococcus pneumoniae       Relevant Orders   Pneumococcal polysaccharide vaccine 23-valent greater than or equal to 2yo subcutaneous/IM (Completed)        Einar Pheasant, MD

## 2022-01-19 ENCOUNTER — Encounter: Payer: Self-pay | Admitting: Internal Medicine

## 2022-01-19 NOTE — Progress Notes (Incomplete)
Patient ID: Madison Burns, female   DOB: [birth year], 71 y.o.   MRN: [id number]   Subjective:    Patient ID: Madison Burns, female    DOB: 1950-08-20, 71 y.o.   MRN: [id number]   Patient here for scheduled follow up.  [name]   HPI Here to follow up regarding hypercholesterolemia, MG and hypertension.  She is accompanied by her sister.  History obtained from both of them.  Saw Dr Ubaldo Glassing 12/04/21 - continue amiodarone and cardizem.  Stable.  No chest pain reported.  Breathing stable.  Had covid in August.  Reports no residual problems.  Saw neurology 01/14/22 - recommended continue mestinon, MTX and folic acid.  Planning for sleep study.     Past Medical History:  Diagnosis Date  . Cancer of kidney ([place]) 2021  . Complication of anesthesia    Myasthenia gravis  . GERD (gastroesophageal reflux disease)   . History of seizure disorder   . Hypercholesterolemia   . Hypertension    no longer has it. wt loss  . Hypothyroidism   . Irritable bowel syndrome   . Melanoma ([place]) 2017   Melanoma on neck  . Myasthenia gravis ([place]) 2011  . OSA on CPAP   . Thymoma, malignant [facility])    Past Surgical History:  Procedure Laterality Date  . ABDOMINAL HYSTERECTOMY  1990  . APPENDECTOMY  1999  . CHOLECYSTECTOMY  2013  . COLONOSCOPY WITH PROPOFOL N/A 06/30/2017   Procedure: COLONOSCOPY WITH PROPOFOL;  Surgeon: Toledo, Benay Pike, MD;  Location: [facility];  Service: Gastroenterology;  Laterality: N/A;  . CYSTOSCOPY W/ URETERAL STENT PLACEMENT Left 08/17/2019   Procedure: CYSTOSCOPY WITH RETROGRADE PYELOGRAM/URETERAL STENT PLACEMENT;  Surgeon: Alexis Frock, MD;  [facility];  Service: Urology;  Laterality: Left;  30 MINS  . ESOPHAGOGASTRODUODENOSCOPY (EGD) WITH PROPOFOL N/A 06/30/2017   Procedure: ESOPHAGOGASTRODUODENOSCOPY (EGD) WITH PROPOFOL;  Surgeon: Toledo, Benay Pike, MD;  Location: [facility];  Service: Gastroenterology;  Laterality: N/A;  . IR RADIOLOGIST EVAL & MGMT  06/28/2019  . IR  RADIOLOGIST EVAL & MGMT  09/14/2019  . IR RADIOLOGIST EVAL & MGMT  03/28/2020  . IR RADIOLOGIST EVAL & MGMT  10/10/2020  . IR RADIOLOGIST EVAL & MGMT  11/22/2021  . RADIOFREQUENCY ABLATION Left 08/17/2019   Procedure: LEFT RENAL CRYO ABLATION;  Surgeon: Jacqulynn Cadet, MD;  [facility];  Service: Anesthesiology;  Laterality: Left;  . TONSILLECTOMY  1959   Family History  Problem Relation Age of Onset  . Colon cancer Maternal Grandfather        stomach/colon  . Heart disease Father        myocardial infarction  . Hyperlipidemia Father   . Hyperlipidemia Sister   . Diabetes Sister   . Bone cancer Other        cousin  . Alzheimer's disease Mother        maternal aunts  . Skin cancer Mother   . Myasthenia gravis Brother        ocular  . Skin cancer Brother   . Stroke Paternal Grandfather   . Breast cancer Neg Hx    Social History   Socioeconomic History  . Marital status: Single    Spouse name: Not on file  . Number of children: 0  . Years of education: Special Ed  . Highest education level: Not on file  Occupational History  . Occupation: Unemployed  Tobacco Use  . Smoking status: Never  . Smokeless tobacco: Never  Vaping Use  .  Vaping Use: Never used  Substance and Sexual Activity  . Alcohol use: No    Alcohol/week: 0.0 standard drinks of alcohol  . Drug use: No  . Sexual activity: Not Currently  Other Topics Concern  . Not on file  Social History Narrative   06/23/17 lives with sister, [name]   Regular exercise-no   Caffeine Use-yes   Social Determinants of Health   Financial Resource Strain: Not on file  Food Insecurity: Not on file  Transportation Needs: Not on file  Physical Activity: Not on file  Stress: Not on file  Social Connections: Not on file     Review of Systems  Constitutional:  Negative for appetite change and unexpected weight change.  HENT:  Negative for congestion and sinus pressure.   Respiratory:  Negative for cough, chest  tightness and shortness of breath.   Cardiovascular:  Negative for chest pain and palpitations.       No increased swelling.   Gastrointestinal:  Negative for abdominal pain, diarrhea, nausea and vomiting.  Genitourinary:  Negative for difficulty urinating and dysuria.  Musculoskeletal:  Negative for joint swelling and myalgias.  Skin:  Negative for color change and rash.  Neurological:  Negative for dizziness and headaches.  Psychiatric/Behavioral:  Negative for agitation and dysphoric mood.        Objective:     BP 130/78 (BP Location: Left Arm, Patient Position: Sitting, Cuff Size: Large)   Pulse 68   Temp 98.4 F (36.9 C) (Temporal)   Resp 17   Ht '5\' 4"'$  (1.626 m)   Wt 225 lb 6.4 oz (102.2 kg)   SpO2 98%   BMI 38.69 kg/m  Wt Readings from Last 3 Encounters:  01/15/22 225 lb 6.4 oz (102.2 kg)  01/14/22 225 lb 12.8 oz (102.4 kg)  11/11/21 218 lb (98.9 kg)    Physical Exam Vitals reviewed.  Constitutional:      General: She is not in acute distress.    Appearance: Normal appearance.  HENT:     Head: Normocephalic and atraumatic.     Right Ear: External ear normal.     Left Ear: External ear normal.  Eyes:     General: No scleral icterus.       Right eye: No discharge.        Left eye: No discharge.     Conjunctiva/sclera: Conjunctivae normal.  Neck:     Thyroid: No thyromegaly.  Cardiovascular:     Rate and Rhythm: Normal rate and regular rhythm.  Pulmonary:     Effort: No respiratory distress.     Breath sounds: Normal breath sounds. No wheezing.  Abdominal:     General: Bowel sounds are normal.     Palpations: Abdomen is soft.     Tenderness: There is no abdominal tenderness.  Musculoskeletal:        General: No swelling or tenderness.     Cervical back: Neck supple. No tenderness.  Lymphadenopathy:     Cervical: No cervical adenopathy.  Skin:    Findings: No erythema or rash.  Neurological:     Mental Status: She is alert.  Psychiatric:        Mood  and Affect: Mood normal.        Behavior: Behavior normal.      Outpatient Encounter Medications as of 01/15/2022  Medication Sig  . acetaminophen (TYLENOL) 325 MG tablet Take 2 tablets (650 mg total) by mouth every 6 (six) hours as needed for mild pain (or  Fever >/= 101).  [name] amiodarone (PACERONE) 200 MG tablet Take 1 tablet (200 mg total) by mouth daily.  . Ascorbic Acid (VITAMIN C) 1000 MG tablet Take 1,000 mg by mouth at bedtime.  . B Complex-C (B-COMPLEX WITH VITAMIN C) tablet Take 1 tablet by mouth every evening.  . benzonatate (TESSALON) 100 MG capsule TAKE 1 CAPSULE BY MOUTH TWICE DAILY AS NEEDED FOR COUGH  . Biotin 10000 MCG TABS Take 10,000 mcg by mouth daily at 2 PM. (1430)  . Calcium-Phosphorus-Vitamin D (CITRACAL +D3 PO) Take 1 tablet by mouth daily.  [name] desmopressin (DDAVP NASAL) 0.01 % solution Place 1 spray (10 mcg total) into the nose 2 (two) times daily.  [name] diltiazem (CARDIZEM CD) 180 MG 24 hr capsule Take by mouth.  . DULoxetine (CYMBALTA) 30 MG capsule TAKE 1 CAPSULE BY MOUTH ONCE DAILY  . folic acid (FOLVITE) 1 MG tablet TAKE 2 TABLETS BY MOUTH ONCE DAILY  . Garlic 4268 MG CAPS Take 1,000 mg by mouth in the morning, at noon, and at bedtime. (0800, 1430, 2000)  . levothyroxine (SYNTHROID) 75 MCG tablet TAKE 1 TABLET BY MOUTH ONCE DAILY ON AN EMPTY STOMACH. WAIT 30 MINUTES BEFORE TAKING OTHER MEDS.  . methotrexate (RHEUMATREX) 2.5 MG tablet TAKE 4 TABLETS BY MOUTH ONCE A WEEK WITHFOLIC ACID  . pantoprazole (PROTONIX) 40 MG tablet Take 40 mg by mouth daily.   . Probiotic Product (PROBIOTIC MULTI-ENZYME PO) Take 1 capsule by mouth in the morning and at bedtime. Probiotic Multi-Enzyme Digestive Formula  . pyridostigmine (MESTINON) 60 MG tablet TAKE 1 TABLET BY MOUTH 3 TIMES DAILY  . risperiDONE (RISPERDAL) 1 MG tablet Take 1 mg by mouth at bedtime.   . tolterodine (DETROL Burns) 4 MG 24 hr capsule TAKE 1 CAPSULE BY MOUTH ONCE EVERY MORNING  . vitamin E 180 MG (400 UNITS) capsule  Take 400 Units by mouth daily at 2 PM. 1430  . [DISCONTINUED] diltiazem (TIAZAC) 180 MG 24 hr capsule Take by mouth.   No facility-administered encounter medications on file as of 01/15/2022.     Lab Results  Component Value Date   WBC 6.9 01/08/2022   HGB 13.3 01/08/2022   HCT 40.3 01/08/2022   PLT 232.0 01/08/2022   GLUCOSE 92 01/08/2022   CHOL 238 (H) 01/08/2022   TRIG 129.0 01/08/2022   HDL 60.60 01/08/2022   LDLDIRECT 122.0 12/14/2018   LDLCALC 152 (H) 01/08/2022   ALT 18 01/08/2022   AST 24 01/08/2022   NA 138 01/08/2022   K 3.6 01/08/2022   CL 100 01/08/2022   CREATININE 0.97 01/08/2022   BUN 15 01/08/2022   CO2 31 01/08/2022   TSH 4.71 01/08/2022   INR 1.0 10/23/2019   HGBA1C 5.6 01/08/2022    IR Radiologist Eval & Mgmt  Result Date: 11/22/2021 Please refer to notes tab for details about interventional procedure. (Op Note)      Assessment & Plan:   Problem List Items Addressed This Visit     Healthcare maintenance    Physical today 01/15/22.       Other Visit Diagnoses     Estrogen deficiency    -  Primary   Relevant Orders   DG Bone Density   Need for influenza vaccination       Relevant Orders   Flu Vaccine QUAD High Dose(Fluad) (Completed)   Need for vaccination against Streptococcus pneumoniae       Relevant Orders   Pneumococcal polysaccharide vaccine 23-valent greater than or equal  to 2yo subcutaneous/IM (Completed)        Einar Pheasant, MD

## 2022-01-20 NOTE — Assessment & Plan Note (Signed)
Followed by neurology.  Stable  

## 2022-01-20 NOTE — Assessment & Plan Note (Signed)
Followed by oncology 

## 2022-01-20 NOTE — Assessment & Plan Note (Signed)
Blood pressure has been doing well. On oral cardizem.  Follow pressures and metabolic panel.

## 2022-01-20 NOTE — Assessment & Plan Note (Signed)
Followed by neurology.   

## 2022-01-20 NOTE — Assessment & Plan Note (Signed)
On thyroid replacement.  Follow tsh.  

## 2022-01-20 NOTE — Addendum Note (Signed)
Addended by: Alisa Graff on: 01/20/2022 12:11 AM   Modules accepted: Level of Service

## 2022-01-20 NOTE — Assessment & Plan Note (Signed)
Saw urology (Dr Erlene Quan) - 05/2021 - recommended f/u MRI in one year.

## 2022-01-20 NOTE — Assessment & Plan Note (Signed)
Planning for sleep study. Seeing neurology.

## 2022-01-20 NOTE — Assessment & Plan Note (Signed)
Declines cholesterol medication.  Discussed again today. Low cholesterol diet and exercise.  Follow lipid panel.

## 2022-01-20 NOTE — Assessment & Plan Note (Signed)
Seeing cardiology.  On amiodarone and cardizem.  Appears to be in SR.  Follow.

## 2022-01-20 NOTE — Assessment & Plan Note (Signed)
Low carb diet and exercise.  Follow met b and a1c.   Lab Results  Component Value Date   HGBA1C 5.6 01/08/2022

## 2022-01-20 NOTE — Assessment & Plan Note (Signed)
Psychiatry.  Continue risperdal.

## 2022-01-21 ENCOUNTER — Telehealth: Payer: Self-pay | Admitting: Neurology

## 2022-01-21 NOTE — Telephone Encounter (Signed)
Patient did not make it to her 10/02/21 [facility] sleep study appointment.  She has r/s for 03/24/22 at 8 pm.  Mailed a new packet and I did put her on the cancellation list.

## 2022-01-23 ENCOUNTER — Other Ambulatory Visit: Payer: Self-pay | Admitting: Neurology

## 2022-01-23 DIAGNOSIS — G4733 Obstructive sleep apnea (adult) (pediatric): Secondary | ICD-10-CM

## 2022-01-23 DIAGNOSIS — F73 Profound intellectual disabilities: Secondary | ICD-10-CM

## 2022-01-23 DIAGNOSIS — G40309 Generalized idiopathic epilepsy and epileptic syndromes, not intractable, without status epilepticus: Secondary | ICD-10-CM

## 2022-01-29 DIAGNOSIS — L218 Other seborrheic dermatitis: Secondary | ICD-10-CM | POA: Diagnosis not present

## 2022-01-29 DIAGNOSIS — L578 Other skin changes due to chronic exposure to nonionizing radiation: Secondary | ICD-10-CM | POA: Diagnosis not present

## 2022-01-29 DIAGNOSIS — Z86018 Personal history of other benign neoplasm: Secondary | ICD-10-CM | POA: Diagnosis not present

## 2022-01-29 DIAGNOSIS — Z85820 Personal history of malignant melanoma of skin: Secondary | ICD-10-CM | POA: Diagnosis not present

## 2022-02-14 ENCOUNTER — Other Ambulatory Visit: Payer: Self-pay | Admitting: Internal Medicine

## 2022-02-24 NOTE — Telephone Encounter (Signed)
Updated insurance.  [facility]- [employer] medicare/medicaid no auth req.

## 2022-03-24 ENCOUNTER — Telehealth: Payer: Self-pay

## 2022-03-24 ENCOUNTER — Ambulatory Visit (INDEPENDENT_AMBULATORY_CARE_PROVIDER_SITE_OTHER): Payer: 59 | Admitting: Neurology

## 2022-03-24 DIAGNOSIS — J9601 Acute respiratory failure with hypoxia: Secondary | ICD-10-CM

## 2022-03-24 DIAGNOSIS — F73 Profound intellectual disabilities: Secondary | ICD-10-CM

## 2022-03-24 DIAGNOSIS — G40309 Generalized idiopathic epilepsy and epileptic syndromes, not intractable, without status epilepticus: Secondary | ICD-10-CM

## 2022-03-24 DIAGNOSIS — G7000 Myasthenia gravis without (acute) exacerbation: Secondary | ICD-10-CM

## 2022-03-24 DIAGNOSIS — G7001 Myasthenia gravis with (acute) exacerbation: Secondary | ICD-10-CM

## 2022-03-24 DIAGNOSIS — E669 Obesity, unspecified: Secondary | ICD-10-CM

## 2022-03-24 DIAGNOSIS — G478 Other sleep disorders: Secondary | ICD-10-CM | POA: Diagnosis not present

## 2022-03-24 DIAGNOSIS — G4719 Other hypersomnia: Secondary | ICD-10-CM

## 2022-03-24 DIAGNOSIS — I480 Paroxysmal atrial fibrillation: Secondary | ICD-10-CM

## 2022-03-24 DIAGNOSIS — C642 Malignant neoplasm of left kidney, except renal pelvis: Secondary | ICD-10-CM

## 2022-03-24 DIAGNOSIS — K589 Irritable bowel syndrome without diarrhea: Secondary | ICD-10-CM

## 2022-03-24 NOTE — Telephone Encounter (Signed)
Pt and [name] showed up for her sleep study. [name] stated she was to stay the night in the sleep lab with the pt. I informed her I was not aware of this info. There was no documentation anywhere that the sister was to spend the night with pt. The sister was adamant that she had to spend the night with the pt. She stated that multiple people were informed of this. Also, that the pt herself told the provider that her sister would need to be present during the sleep study. Since there was no documentation of this I reached out to the sleep lab manager. Manager stated she had no knowledge of the sister staying with the pt. I was informed that since the sister and pt insisted on the sister staying to just let the her stay the night with the pt.

## 2022-03-28 NOTE — Procedures (Signed)
[facility] POLYSOMNOGRAPHY  INTERPRETATION REPORT   STUDY DATE:  03/24/2022     PATIENT NAME:  Madison Burns. Mcnealy         DATE OF BIRTH:  20-Jan-1951  PATIENT ID:  [id number]    TYPE OF STUDY:  PSG  READING PHYSICIAN: [name], MD REFERRED BY: GNA  [name], NP SCORING TECHNICIAN: Richard Miu, RPSGT   HISTORY:  This 72 year-old Female patient was seen on  08/23/21 and has a history of mental retardation, is living with her older sister, Madison Burns. She has Myasthenia gravis ,  seizures and atrial fibrillation, has been diagnosed with  kidney cancer - finished her chemotherapy. She reportedly snores and is a light sleeper, has enuresis. Enuresis started after a fall in 04/07/21 and there was a concern of nocturnal seizures but responded to desmopressin. She had frequent falls in 2023. A previous HST had not shown any apnea and Madison Burns, her sister, wanted to have a PSG- in lab study was scheduled for 09-2021 but patient contracted Covid and then forgot to reschedule. She gained some weight since her gait has been affected by MG and she has become less active, about 20 pounds.  ADDITIONAL INFORMATION:  The Epworth Sleepiness Scale endorsed at 3 /24 points (scores above or equal to 10 are suggestive of hypersomnolence). FSS endorsed at:  n/a  /63 points.  Sister Madison Burns is present during the sleep test.   Height: 64 in Weight: 225 lbs (BMI 38) Neck Size: 16 in  MEDICATIONS: Folic Acid, Vitamin E, Detrol, Risperdal for sleep, Mestinon, Protonix, Probiotic, Synthroid, Rheumatrex, Garlic, Cymbalta, Desmopressin, Vitamin D, Tessalon, Biotin, B Complex, Vitamin C, Pacerone, Tylenol  TECHNICAL DESCRIPTION: A registered sleep technologist ( RPSGT)  was in attendance for the duration of the recording.  Data collection, scoring, video monitoring, and reporting were performed in compliance with the AASM Manual for the Scoring of Sleep and Associated Events; (Hypopnea is scored  based on the criteria listed in Section VIII D. 1b in the AASM Manual V2.6 using a 4% oxygen desaturation rule or Hypopnea is scored based on the criteria listed in Section VIII D. 1a in the AASM Manual V2.6 using 3% oxygen desaturation and /or arousal rule).   SLEEP CONTINUITY AND SLEEP ARCHITECTURE:  Lights-out was at 21:13: and lights-on at  05:06:, with  7.9 hours of recording time, 460 minutes . Total sleep time ( TST) was 161.0 minutes with a decreased sleep efficiency at 34.0%. There was no REM sleep - Sleep latency was increased at 149.0 minutes.   Of the total sleep time, the percentage of stage N1 sleep was 14.6%, stage N2 sleep was 85%, stage N3 sleep was 0.0%, and REM sleep was 0.0%. BODY POSITION:  TST was divided between the following sleep positions: 100.0% supine; 0.0% lateral; 0% prone.   There were 17 awakenings (i.e., transitions to Stage W from any sleep stage), and 46 total stage transitions. Wake after sleep onset (WASO) time accounted for 163 minutes.   RESPIRATORY MONITORING:   Based on AASM criteria (using a 3% oxygen desaturation and /or arousal rule for scoring hypopneas), there were 3 apneas (3 obstructive; 0 central; 0 mixed), and 10 hypopneas.  The Apnea index was 1.1/h. Hypopnea index was 3.7/h. The AHI (apnea-hypopnea index) was 4.8/h overall (4.8 supine, 0 non-supine; 0.0 REM, 0.0 supine REM). Mild to moderate snoring was noted but did not cause arousals.   There were 6 respiratory effort-related arousals (RERAs).  The RDI is 7.2/h.  OXIMETRY: Oxyhemoglobin Saturation Nadir during sleep was at  84%) from a mean of 93%.  Of the Total sleep time (TST) there was   hypoxemia (=<89%) present for 0.5 minutes, or 0.3% of total sleep time.  LIMB MOVEMENTS: There were 0 periodic limb movements of sleep (0.0/h) AROUSAL: There were 26 arousals in total, for an arousal index of 10 /hour.  Of these, 1 was identified as respiratory-related arousals (0 /h), 0 were PLM-related arousals  (0 /h), and 35 were non-specific arousals (13 /h) and 10 were microarousals. There were 0 occurrences of Cheyne Stokes breathing. EEG:  PSG EEG was of normal amplitude and frequency, with symmetric manifestation of sleep stages. EKG: The electrocardiogram documented regular rhythm.  The average heart rate during sleep was 71 bpm.  The heart rate during sleep varied between a minimum of 65and  a maximum of  75 bpm. AUDIO and VIDEO:   IMPRESSION: 1) Sleep disordered breathing was present at a clinically irrelevant degree with an AHI of less than 5/h.     2) Periodic limb movements were not present. No enuresis occurred and no dream enactment was captured. 3) Sleep efficiency was very poor, unclear if this night was representative or not. The patient uses Risperdal to aid her sleep. Total sleep time was reduced at 161.0 minutes.  Sleep efficiency was decreased at 34.0%.    RECOMMENDATIONS: based on these PSG results, there is snoring present while sleeping supine but not enough apnea is present to warrant intervention or therapy. Snoring can be highly dependent on body weight and weight loss is recommended.  If the patient is able to sleep in non-supine sleep, her snoring will also be reduced.  [name], MD [facility] Fellow of the AASM and AAN,  Diplomate of the ABPN and ABSM.             Name: Allyse, Rondinelli BMI: [id number] Physician: [name], MD  ID: [id number] Height: 64.0 in Technician: Richard Miu, RPSGT  Sex: Female Weight: 225.0 lb Record: [id number]  Age: 72 [15-Jun-1950] Date: 03/24/2022    Medical & Medication History    Madison Burns returns for follow up for MG and questionable OSA. She was last seen by Dr Brett Fairy 08/2021. In lab PSA ordered as previous HST had not shown any significant sleep apnea. Sister was concerned about witnessed snoring, apneas and enuresis. She was started on DDAVP nasal spray. Since, she reports doing fairly well. MG symptoms seem  to be well controlled. She continues mestinon 55m TID, methotrexate [id number] and folic acid 217mdaily. Gait is stable. She is not using CPAP. NaIzora Galasister, is still concerned about her snoring. They were scheduled for in lab sleep study in 09/2021 but came down with Covid and have forgotten to call to reschedule. She is not as active. She has gained about 15-20lbs. 09/04/2021 CD: ReSERA TERRANOVAs a [age].o. female with mental retardation, living with her sister.Here for 6 month MG and sleep f/u. Pt continues to snore and has started to wet the bed ever since her last fall, 2/19. Pt is such a light sleeper, currently taking risperidone. Had 3 falls in January and since has been wetting the bed. Miss ReMersedesas been diagnosed with renal cell cancer and began having enuresis- her Myasthenia has taken a turn for the worse. She is presenting with ptosis , neck weakness and left shoulder slump. She is no longer able to walk unassisted, presents  stooped. Her voice is hoarse. Her mood is good, she lost weight , BMI 36.73 kg/m2. She completed her cancer treatments. She brought me another drawing- I have a collection of the teddy brea drawings since 15 years ago. She is awaiting a CT of the kidney or MRI. Calculated pAHI (per hour): The calculated AHI was only 3.5/h there was no breakdown in rem sleep and non-REM sleep possible. No data of sleep position or snoring were provided either. O2 Saturation Range (%): Oxygen saturation ranged between a nadir of 74% and a maximum saturation of 97% with a mean oxygenation at 93% SPO2. Time spent under 89% oxygen saturation amounted to 5.2 minutes or 1.4% of sleep time.  Folic Acid, Vitamin E, Detrol, Risperdal, Mestinon, Protonix, Probiotic, Snythroid, Rheumatrex, Garlic, Cymbalta, Desmopressin, Vitamin D, Tessalon, Biotin, B Complex, Vitamin C, Pacerone, Tylenol   Sleep Disorder      Comments   The patient came into the lab for a PSG. She took Risperdal prior to start of  sleep study. Prior hst on 10/15/20 with our sleep lab. The patient also had sleep studies completed in 2016. The patient's sister insisted she needed to be present during the sleep study. The patient had very little sleep. Per patient's sister the patient is a very light sleeper. Mild to moderate snoring. The patient had one restroom break. EKG kept in NSR. Respiratory events scored with a 4% desat. Slept supine.     Lights out: 09:13:08 PM Lights on: 05:06:41 AM   Time Total Supine Side Prone Upright  Recording (TRT) 7h 53.22m7h 53.56mh 0.19m86m 0.19m 57m0.19m  12mep (TST) 2h 41.19m 2h57m.19m 0h 24mm 0h 022m 0h 0.47m  Late38m N1 N2 N3 REM Onset Per. Slp. Eff.  Actual 2h 29.19m 2h 35.047mh 0.19m 66m0.19m 262m9.19m 219m3.29m 3488m%   [id number] Wake N1 N2 N3 REM  Total 163.5 23.5 137.5 0.0 0.0  Supine 163.5 23.5 137.5 0.0 0.0  Side 0.0 0.0 0.0 0.0 0.0  Prone 0.0 0.0 0.0 0.0 0.0  Upright 0.0 0.0 0.0 0.0 0.0   Stg % Wake N1 N2 N3 REM  Total 50.4 14.6 85.4 0.0 0.0  Supine 50.4 14.6 85.4 0.0 0.0  Side 0.0 0.0 0.0 0.0 0.0  Prone 0.0 0.0 0.0 0.0 0.0  Upright 0.0 0.0 0.0 0.0 0.0     Apnea Summary Sub Supine Side Prone Upright  Total 3 Total 3 3 0 0 0    REM 0 0 0 0 0    NREM 3 3 0 0 0  Obs 3 REM 0 0 0 0 0    NREM 3 3 0 0 0  Mix 0 REM 0 0 0 0 0    NREM 0 0 0 0 0  Cen 0 REM 0 0 0 0 0    NREM 0 0 0 0 0   Rera Summary Sub Supine Side Prone Upright  Total 0 Total 0 0 0 0 0    REM 0 0 0 0 0    NREM 0 0 0 0 0   Hypopnea Summary Sub Supine Side Prone Upright  Total 10 Total 10 10 0 0 0    REM 0 0 0 0 0    NREM 10 10 0 0 0   4% Hypopnea Summary Sub Supine Side Prone Upright  Total (4%) 9 Total 9 9 0 0 0    REM 0 0 0 0 0  NREM 9 9 0 0 0     AHI Total Obs Mix Cen  4.84 Apnea 1.12 1.12 0.00 0.00   Hypopnea 3.73 -- -- --  4.47 Hypopnea (4%) 3.35 -- -- --    Total Supine Side Prone Upright  Position AHI 4.84 4.84 0.00 0.00 0.00  REM AHI 0.00   NREM AHI 4.84   Position RDI 4.84 4.84 0.00  0.00 0.00  REM RDI 0.00   NREM RDI 4.84    4% Hypopnea Total Supine Side Prone Upright  Position AHI (4%) 4.47 4.47 0.00 0.00 0.00  REM AHI (4%) 0.00   NREM AHI (4%) 4.47   Position RDI (4%) 4.47 4.47 0.00 0.00 0.00  REM RDI (4%) 0.00   NREM RDI (4%) 4.47    Desaturation Information Threshold: 2% <100% <90% <80% <70% <60% <50% <40%  Supine 121.0 5.0 0.0 0.0 0.0 0.0 0.0  Side 0.0 0.0 0.0 0.0 0.0 0.0 0.0  Prone 0.0 0.0 0.0 0.0 0.0 0.0 0.0  Upright 0.0 0.0 0.0 0.0 0.0 0.0 0.0  Total 121.0 5.0 0.0 0.0 0.0 0.0 0.0  Index 22.4 0.9 0.0 0.0 0.0 0.0 0.0   Threshold: 3% <100% <90% <80% <70% <60% <50% <40%  Supine 16.0 5.0 0.0 0.0 0.0 0.0 0.0  Side 0.0 0.0 0.0 0.0 0.0 0.0 0.0  Prone 0.0 0.0 0.0 0.0 0.0 0.0 0.0  Upright 0.0 0.0 0.0 0.0 0.0 0.0 0.0  Total 16.0 5.0 0.0 0.0 0.0 0.0 0.0  Index 3.0 0.9 0.0 0.0 0.0 0.0 0.0   Threshold: 4% <100% <90% <80% <70% <60% <50% <40%  Supine 12.0 5.0 0.0 0.0 0.0 0.0 0.0  Side 0.0 0.0 0.0 0.0 0.0 0.0 0.0  Prone 0.0 0.0 0.0 0.0 0.0 0.0 0.0  Upright 0.0 0.0 0.0 0.0 0.0 0.0 0.0  Total 12.0 5.0 0.0 0.0 0.0 0.0 0.0  Index 2.2 0.9 0.0 0.0 0.0 0.0 0.0   Threshold: 4% <100% <90% <80% <70% <60% <50% <40%  Supine 12 5 0 0 0 0 0  Side 0 0 0 0 0 0 0  Prone 0 0 0 0 0 0 0  Upright 0 0 0 0 0 0 0  Total 12 5 0 0 0 0 0   Awakening/Arousal Information # of Awakenings 17  Wake after sleep onset 163.63m Wake after persistent sleep 157.569m Arousal Assoc. Arousals Index  Apneas 0 0.0  Hypopneas 1 0.4  Leg Movements 0 0.0  Snore 0 0.0  PTT Arousals 0 0.0  Spontaneous 35 13.0  Total 36 13.4  Leg Movement Information PLMS LMs Index  Total LMs during PLMS 0 0.0  LMs w/ Microarousals 0 0.0   LM LMs Index  w/ Microarousal 0 0.0  w/ Awakening 0 0.0  w/ Resp Event 0 0.0  Spontaneous 0 0.0  Total 0 0.0     Desaturation threshold setting: 4% Minimum desaturation setting: 10 seconds SaO2 nadir: 84% The longest event was a 43 sec obstructive Hypopnea with  a minimum SaO2 of 90%. The lowest SaO2 was 84% associated with a 22 sec obstructive Hypopnea. EKG Rates EKG Avg Max Min  Awake 72 97 67  Asleep 71 75

## 2022-04-01 ENCOUNTER — Telehealth: Payer: Self-pay | Admitting: Neurology

## 2022-04-01 ENCOUNTER — Emergency Department: Payer: 59

## 2022-04-01 ENCOUNTER — Other Ambulatory Visit: Payer: Self-pay

## 2022-04-01 ENCOUNTER — Inpatient Hospital Stay
Admission: EM | Admit: 2022-04-01 | Discharge: 2022-04-10 | DRG: 641 | Disposition: A | Discharge status: Skilled Nursing Facility | Payer: 59 | Attending: Internal Medicine | Admitting: Internal Medicine

## 2022-04-01 ENCOUNTER — Telehealth: Payer: Self-pay | Admitting: Internal Medicine

## 2022-04-01 ENCOUNTER — Other Ambulatory Visit: Payer: Self-pay | Admitting: Oncology

## 2022-04-01 DIAGNOSIS — E872 Acidosis, unspecified: Secondary | ICD-10-CM | POA: Diagnosis present

## 2022-04-01 DIAGNOSIS — A419 Sepsis, unspecified organism: Secondary | ICD-10-CM | POA: Diagnosis present

## 2022-04-01 DIAGNOSIS — E876 Hypokalemia: Secondary | ICD-10-CM | POA: Diagnosis present

## 2022-04-01 DIAGNOSIS — D62 Acute posthemorrhagic anemia: Secondary | ICD-10-CM | POA: Diagnosis present

## 2022-04-01 DIAGNOSIS — E878 Other disorders of electrolyte and fluid balance, not elsewhere classified: Secondary | ICD-10-CM | POA: Diagnosis present

## 2022-04-01 DIAGNOSIS — K219 Gastro-esophageal reflux disease without esophagitis: Secondary | ICD-10-CM | POA: Diagnosis not present

## 2022-04-01 DIAGNOSIS — D72829 Elevated white blood cell count, unspecified: Secondary | ICD-10-CM | POA: Diagnosis present

## 2022-04-01 DIAGNOSIS — E871 Hypo-osmolality and hyponatremia: Secondary | ICD-10-CM

## 2022-04-01 DIAGNOSIS — Z85528 Personal history of other malignant neoplasm of kidney: Secondary | ICD-10-CM

## 2022-04-01 DIAGNOSIS — Z043 Encounter for examination and observation following other accident: Secondary | ICD-10-CM | POA: Diagnosis not present

## 2022-04-01 DIAGNOSIS — R079 Chest pain, unspecified: Secondary | ICD-10-CM | POA: Diagnosis not present

## 2022-04-01 DIAGNOSIS — Z743 Need for continuous supervision: Secondary | ICD-10-CM | POA: Diagnosis not present

## 2022-04-01 DIAGNOSIS — K5730 Diverticulosis of large intestine without perforation or abscess without bleeding: Secondary | ICD-10-CM | POA: Diagnosis not present

## 2022-04-01 DIAGNOSIS — I480 Paroxysmal atrial fibrillation: Secondary | ICD-10-CM | POA: Diagnosis present

## 2022-04-01 DIAGNOSIS — Z66 Do not resuscitate: Secondary | ICD-10-CM | POA: Diagnosis not present

## 2022-04-01 DIAGNOSIS — S300XXA Contusion of lower back and pelvis, initial encounter: Secondary | ICD-10-CM | POA: Diagnosis present

## 2022-04-01 DIAGNOSIS — M6259 Muscle wasting and atrophy, not elsewhere classified, multiple sites: Secondary | ICD-10-CM | POA: Diagnosis not present

## 2022-04-01 DIAGNOSIS — D72820 Lymphocytosis (symptomatic): Secondary | ICD-10-CM | POA: Diagnosis not present

## 2022-04-01 DIAGNOSIS — I10 Essential (primary) hypertension: Secondary | ICD-10-CM | POA: Diagnosis present

## 2022-04-01 DIAGNOSIS — R279 Unspecified lack of coordination: Secondary | ICD-10-CM | POA: Diagnosis not present

## 2022-04-01 DIAGNOSIS — K5790 Diverticulosis of intestine, part unspecified, without perforation or abscess without bleeding: Secondary | ICD-10-CM | POA: Diagnosis not present

## 2022-04-01 DIAGNOSIS — W19XXXA Unspecified fall, initial encounter: Principal | ICD-10-CM

## 2022-04-01 DIAGNOSIS — G7000 Myasthenia gravis without (acute) exacerbation: Secondary | ICD-10-CM | POA: Diagnosis present

## 2022-04-01 DIAGNOSIS — Z741 Need for assistance with personal care: Secondary | ICD-10-CM | POA: Diagnosis not present

## 2022-04-01 DIAGNOSIS — Z820 Family history of epilepsy and other diseases of the nervous system: Secondary | ICD-10-CM

## 2022-04-01 DIAGNOSIS — Z9049 Acquired absence of other specified parts of digestive tract: Secondary | ICD-10-CM

## 2022-04-01 DIAGNOSIS — S62357A Nondisplaced fracture of shaft of fifth metacarpal bone, left hand, initial encounter for closed fracture: Secondary | ICD-10-CM | POA: Diagnosis present

## 2022-04-01 DIAGNOSIS — E7800 Pure hypercholesterolemia, unspecified: Secondary | ICD-10-CM | POA: Diagnosis not present

## 2022-04-01 DIAGNOSIS — S0992XA Unspecified injury of nose, initial encounter: Secondary | ICD-10-CM | POA: Diagnosis not present

## 2022-04-01 DIAGNOSIS — R296 Repeated falls: Secondary | ICD-10-CM | POA: Diagnosis present

## 2022-04-01 DIAGNOSIS — W010XXA Fall on same level from slipping, tripping and stumbling without subsequent striking against object, initial encounter: Secondary | ICD-10-CM | POA: Diagnosis present

## 2022-04-01 DIAGNOSIS — S199XXA Unspecified injury of neck, initial encounter: Secondary | ICD-10-CM | POA: Diagnosis not present

## 2022-04-01 DIAGNOSIS — J941 Fibrothorax: Secondary | ICD-10-CM | POA: Diagnosis not present

## 2022-04-01 DIAGNOSIS — G4733 Obstructive sleep apnea (adult) (pediatric): Secondary | ICD-10-CM | POA: Diagnosis present

## 2022-04-01 DIAGNOSIS — Z85820 Personal history of malignant melanoma of skin: Secondary | ICD-10-CM

## 2022-04-01 DIAGNOSIS — I959 Hypotension, unspecified: Secondary | ICD-10-CM | POA: Diagnosis not present

## 2022-04-01 DIAGNOSIS — R197 Diarrhea, unspecified: Secondary | ICD-10-CM | POA: Diagnosis present

## 2022-04-01 DIAGNOSIS — Z83438 Family history of other disorder of lipoprotein metabolism and other lipidemia: Secondary | ICD-10-CM

## 2022-04-01 DIAGNOSIS — S62357D Nondisplaced fracture of shaft of fifth metacarpal bone, left hand, subsequent encounter for fracture with routine healing: Secondary | ICD-10-CM | POA: Diagnosis not present

## 2022-04-01 DIAGNOSIS — R0682 Tachypnea, not elsewhere classified: Secondary | ICD-10-CM | POA: Diagnosis present

## 2022-04-01 DIAGNOSIS — R9431 Abnormal electrocardiogram [ECG] [EKG]: Secondary | ICD-10-CM | POA: Diagnosis present

## 2022-04-01 DIAGNOSIS — S6720XA Crushing injury of unspecified hand, initial encounter: Secondary | ICD-10-CM | POA: Diagnosis not present

## 2022-04-01 DIAGNOSIS — S0993XA Unspecified injury of face, initial encounter: Secondary | ICD-10-CM

## 2022-04-01 DIAGNOSIS — Z800 Family history of malignant neoplasm of digestive organs: Secondary | ICD-10-CM

## 2022-04-01 DIAGNOSIS — I499 Cardiac arrhythmia, unspecified: Secondary | ICD-10-CM | POA: Diagnosis not present

## 2022-04-01 DIAGNOSIS — E039 Hypothyroidism, unspecified: Secondary | ICD-10-CM | POA: Diagnosis present

## 2022-04-01 DIAGNOSIS — E861 Hypovolemia: Secondary | ICD-10-CM | POA: Diagnosis not present

## 2022-04-01 DIAGNOSIS — E222 Syndrome of inappropriate secretion of antidiuretic hormone: Secondary | ICD-10-CM | POA: Diagnosis not present

## 2022-04-01 DIAGNOSIS — Z1152 Encounter for screening for COVID-19: Secondary | ICD-10-CM

## 2022-04-01 DIAGNOSIS — M47817 Spondylosis without myelopathy or radiculopathy, lumbosacral region: Secondary | ICD-10-CM | POA: Diagnosis not present

## 2022-04-01 DIAGNOSIS — E860 Dehydration: Secondary | ICD-10-CM | POA: Diagnosis not present

## 2022-04-01 DIAGNOSIS — S0990XA Unspecified injury of head, initial encounter: Secondary | ICD-10-CM | POA: Diagnosis not present

## 2022-04-01 DIAGNOSIS — R531 Weakness: Secondary | ICD-10-CM | POA: Diagnosis not present

## 2022-04-01 DIAGNOSIS — Z90710 Acquired absence of both cervix and uterus: Secondary | ICD-10-CM

## 2022-04-01 DIAGNOSIS — E669 Obesity, unspecified: Secondary | ICD-10-CM | POA: Diagnosis present

## 2022-04-01 DIAGNOSIS — S0083XA Contusion of other part of head, initial encounter: Secondary | ICD-10-CM | POA: Diagnosis not present

## 2022-04-01 DIAGNOSIS — R7989 Other specified abnormal findings of blood chemistry: Secondary | ICD-10-CM | POA: Diagnosis present

## 2022-04-01 DIAGNOSIS — Z823 Family history of stroke: Secondary | ICD-10-CM

## 2022-04-01 DIAGNOSIS — Z79890 Hormone replacement therapy (postmenopausal): Secondary | ICD-10-CM

## 2022-04-01 DIAGNOSIS — K589 Irritable bowel syndrome without diarrhea: Secondary | ICD-10-CM | POA: Diagnosis present

## 2022-04-01 DIAGNOSIS — J8410 Pulmonary fibrosis, unspecified: Secondary | ICD-10-CM | POA: Diagnosis not present

## 2022-04-01 DIAGNOSIS — Z833 Family history of diabetes mellitus: Secondary | ICD-10-CM

## 2022-04-01 DIAGNOSIS — R262 Difficulty in walking, not elsewhere classified: Secondary | ICD-10-CM | POA: Diagnosis not present

## 2022-04-01 DIAGNOSIS — E278 Other specified disorders of adrenal gland: Secondary | ICD-10-CM | POA: Diagnosis not present

## 2022-04-01 DIAGNOSIS — Z6841 Body Mass Index (BMI) 40.0 and over, adult: Secondary | ICD-10-CM

## 2022-04-01 DIAGNOSIS — Z79899 Other long term (current) drug therapy: Secondary | ICD-10-CM

## 2022-04-01 DIAGNOSIS — S62346A Nondisplaced fracture of base of fifth metacarpal bone, right hand, initial encounter for closed fracture: Secondary | ICD-10-CM | POA: Diagnosis not present

## 2022-04-01 DIAGNOSIS — Z8249 Family history of ischemic heart disease and other diseases of the circulatory system: Secondary | ICD-10-CM

## 2022-04-01 DIAGNOSIS — Z808 Family history of malignant neoplasm of other organs or systems: Secondary | ICD-10-CM

## 2022-04-01 DIAGNOSIS — M25559 Pain in unspecified hip: Secondary | ICD-10-CM | POA: Diagnosis not present

## 2022-04-01 DIAGNOSIS — I700 Atherosclerosis of aorta: Secondary | ICD-10-CM | POA: Diagnosis not present

## 2022-04-01 DIAGNOSIS — M189 Osteoarthritis of first carpometacarpal joint, unspecified: Secondary | ICD-10-CM | POA: Diagnosis not present

## 2022-04-01 LAB — TROPONIN I (HIGH SENSITIVITY): Troponin I (High Sensitivity): 8 ng/L (ref <18)

## 2022-04-01 LAB — COMPREHENSIVE METABOLIC PANEL
ALT: 19 U/L (ref 0–44)
AST: 35 U/L (ref 15–41)
Albumin: 3.1 g/dL — ABNORMAL LOW (ref 3.5–5.0)
Alkaline Phosphatase: 55 U/L (ref 38–126)
Anion gap: 15 (ref 5–15)
BUN: 24 mg/dL — ABNORMAL HIGH (ref 8–23)
CO2: 20 mmol/L — ABNORMAL LOW (ref 22–32)
Calcium: 8.2 mg/dL — ABNORMAL LOW (ref 8.9–10.3)
Chloride: 89 mmol/L — ABNORMAL LOW (ref 98–111)
Creatinine, Ser: 1.23 mg/dL — ABNORMAL HIGH (ref 0.44–1.00)
GFR, Estimated: 47 mL/min — ABNORMAL LOW (ref >60)
Glucose, Bld: 193 mg/dL — ABNORMAL HIGH (ref 70–99)
Potassium: 3.2 mmol/L — ABNORMAL LOW (ref 3.5–5.1)
Sodium: 124 mmol/L — ABNORMAL LOW (ref 135–145)
Total Bilirubin: 0.6 mg/dL (ref 0.3–1.2)
Total Protein: 5.4 g/dL — ABNORMAL LOW (ref 6.5–8.1)

## 2022-04-01 LAB — CBC WITH DIFFERENTIAL/PLATELET
Abs Immature Granulocytes: 0.13 10*3/uL — ABNORMAL HIGH (ref 0.00–0.07)
Basophils Absolute: 0.1 10*3/uL (ref 0.0–0.1)
Basophils Relative: 0 %
Eosinophils Absolute: 0 10*3/uL (ref 0.0–0.5)
Eosinophils Relative: 0 %
HCT: 33.1 % — ABNORMAL LOW (ref 36.0–46.0)
Hemoglobin: 10.6 g/dL — ABNORMAL LOW (ref 12.0–15.0)
Immature Granulocytes: 1 %
Lymphocytes Relative: 6 %
Lymphs Abs: 1.2 10*3/uL (ref 0.7–4.0)
MCH: 28.8 pg (ref 26.0–34.0)
MCHC: 32 g/dL (ref 30.0–36.0)
MCV: 89.9 fL (ref 80.0–100.0)
Monocytes Absolute: 1.7 10*3/uL — ABNORMAL HIGH (ref 0.1–1.0)
Monocytes Relative: 9 %
Neutro Abs: 16.4 10*3/uL — ABNORMAL HIGH (ref 1.7–7.7)
Neutrophils Relative %: 84 %
Platelets: 259 10*3/uL (ref 150–400)
RBC: 3.68 MIL/uL — ABNORMAL LOW (ref 3.87–5.11)
RDW: 16 % — ABNORMAL HIGH (ref 11.5–15.5)
WBC: 19.4 10*3/uL — ABNORMAL HIGH (ref 4.0–10.5)
nRBC: 0 % (ref 0.0–0.2)

## 2022-04-01 LAB — LACTIC ACID, PLASMA: Lactic Acid, Venous: 5.2 mmol/L (ref 0.5–1.9)

## 2022-04-01 LAB — T4, FREE: Free T4: 1.29 ng/dL — ABNORMAL HIGH (ref 0.61–1.12)

## 2022-04-01 LAB — TSH: TSH: 4.346 u[IU]/mL (ref 0.350–4.500)

## 2022-04-01 MED ORDER — LINEZOLID 600 MG/300ML IV SOLN
600.0000 mg | Freq: Once | INTRAVENOUS | Status: AC
  Administered 2022-04-02: 600 mg via INTRAVENOUS
  Filled 2022-04-01: qty 300

## 2022-04-01 MED ORDER — SODIUM CHLORIDE 0.9 % IV BOLUS (SEPSIS)
1000.0000 mL | Freq: Once | INTRAVENOUS | Status: AC
  Administered 2022-04-02: 1000 mL via INTRAVENOUS

## 2022-04-01 MED ORDER — SODIUM CHLORIDE 0.9 % IV SOLN
2.0000 g | Freq: Once | INTRAVENOUS | Status: AC
  Administered 2022-04-02: 2 g via INTRAVENOUS
  Filled 2022-04-01: qty 12.5

## 2022-04-01 MED ORDER — SODIUM CHLORIDE 0.9 % IV BOLUS
1000.0000 mL | Freq: Once | INTRAVENOUS | Status: AC
  Administered 2022-04-01: 1000 mL via INTRAVENOUS

## 2022-04-01 MED ORDER — IOHEXOL 300 MG/ML  SOLN
100.0000 mL | Freq: Once | INTRAMUSCULAR | Status: AC | PRN
  Administered 2022-04-02: 100 mL via INTRAVENOUS

## 2022-04-01 MED ORDER — LINEZOLID 600 MG/300ML IV SOLN
600.0000 mg | Freq: Two times a day (BID) | INTRAVENOUS | Status: DC
  Filled 2022-04-01: qty 300

## 2022-04-01 MED ORDER — LACTATED RINGERS IV SOLN: INTRAVENOUS | Status: AC

## 2022-04-01 MED ORDER — BENZONATATE 100 MG PO CAPS: ORAL_CAPSULE | ORAL | 0 refills | Status: DC

## 2022-04-01 NOTE — Telephone Encounter (Signed)
-----   Message from [name], MD sent at 03/28/2022  6:13 PM EST ----- Apnea was too mild to warrant PAP therapy, but snoring was noted. There was too little sleep recorded and no REM sleep was captured to be absolutely sure but I recommend weight loss, respiratory therapy ( MG treatment) and avoiding  supine sleep  rather than restarting CPAP.

## 2022-04-01 NOTE — Telephone Encounter (Signed)
Refill sent to [facility]

## 2022-04-01 NOTE — ED Triage Notes (Signed)
Patient has hx of Myasthenia Gravis, and is complaining of weakness with several falls today.  Patient hit her nose on the corner of a door frame.  Patient also complains of left hand bruising and that her hips are sore.

## 2022-04-01 NOTE — Telephone Encounter (Signed)
Called the sister and reviewed the sleep study results with her. Advised the sleep study didn't indicate concerns for sleep apnea and she would no longer need CPAP. The sister verbalized understanding and had no further questions or concerns.

## 2022-04-01 NOTE — Telephone Encounter (Signed)
Pt need a refill on benzonatate sent to [facility]

## 2022-04-01 NOTE — ED Provider Notes (Addendum)
[facility] Provider Note    Event Date/Time   First MD Initiated Contact with Patient 04/01/22 2135     (approximate)   History   Fall and Weakness   HPI  Madison Burns is a 72 y.o. female   Past medical history of cognitive delay, myasthenia gravis, kidney cancer, hypertension hyperlipidemia, hypothyroid who presents to the emergency department with a ambulatory slip and fall earlier today around 3 PM falling face first and striking her nose.  She also bruised her left hand and hurt her pelvis.  No loss of consciousness no blood thinners.  She did not come to the emergency department because she felt better and was able to get up and walk around to perform her activities as usual but then had another episode later that evening where she seemed to stumble backwards and sat down on a chair which looked awkward to her sister.  She lives with her sister who is her caretaker who is here at bedside as well.  She had several episodes of loose watery stools today without blood.  No nausea or vomiting.  Last several days have otherwise been unremarkable no recent illnesses no changes in medication she is fully compliant with all of her medications.  She denies chest pain, shortness of breath, palpitations, cough, fever, chills, dysuria.  She denies weakness.  Currently she feels well.  She has no complaints.  Her sister says that she appears at her baseline.  Independent Historian contributed to assessment above: The patient's sister  External Medical Documents Reviewed: Neurology note dated 03/24/2022 for sleep study, and a review of past medical history was noted on that documentation      Physical Exam   Triage Vital Signs: ED Triage Vitals  Enc Vitals Group     BP 04/01/22 2137 (!) 98/52     Pulse Rate 04/01/22 2137 81     Resp 04/01/22 2137 18     Temp 04/01/22 2137 (!) 97.4 F (36.3 C)     Temp Source 04/01/22 2137 Oral     SpO2 04/01/22 2137 97 %      Weight 04/01/22 2140 233 lb 11 oz (106 kg)     Height 04/01/22 2140 5' 4"$  (1.626 m)     Head Circumference --      Peak Flow --      Pain Score --      Pain Loc --      Pain Edu? --      Excl. in [place]? --     Most recent vital signs: Vitals:   04/01/22 2137 04/01/22 2300  BP: (!) 98/52 98/62  Pulse: 81 78  Resp: 18 20  Temp: (!) 97.4 F (36.3 C)   SpO2: 97% 99%    General: Awake, no distress.  CV:  Good peripheral perfusion.  Resp:  Normal effort.  Abd:  No distention.  Other:  Wake alert oriented comfortable appearing nontoxic.  Abdomen soft nontender lungs are clear.  Skin with some poor skin turgor and dry mucous membranes.  Her speech is normal per her sister.  She is breathing comfortably.  Her blood pressure is slightly low 90s over 50s.  She is afebrile.  She has no hypoxemia.  With sustained upward gaze there are no signs of extraocular movement dysfunction or ptosis.  He has no focal neurologic deficits full motor or sensory intact no dysarthria or facial asymmetry.   ED Results / Procedures / Treatments  Labs (all labs ordered are listed, but only abnormal results are displayed) Labs Reviewed  COMPREHENSIVE METABOLIC PANEL - Abnormal; Notable for the following components:      Result Value   Sodium 124 (*)    Potassium 3.2 (*)    Chloride 89 (*)    CO2 20 (*)    Glucose, Bld 193 (*)    BUN 24 (*)    Creatinine, Ser 1.23 (*)    Calcium 8.2 (*)    Total Protein 5.4 (*)    Albumin 3.1 (*)    GFR, Estimated 47 (*)    All other components within normal limits  LACTIC ACID, PLASMA - Abnormal; Notable for the following components:   Lactic Acid, Venous 5.2 (*)    All other components within normal limits  CBC WITH DIFFERENTIAL/PLATELET - Abnormal; Notable for the following components:   WBC 19.4 (*)    RBC 3.68 (*)    Hemoglobin 10.6 (*)    HCT 33.1 (*)    RDW 16.0 (*)    Neutro Abs 16.4 (*)    Monocytes Absolute 1.7 (*)    Abs Immature Granulocytes  0.13 (*)    All other components within normal limits  T4, FREE - Abnormal; Notable for the following components:   Free T4 1.29 (*)    All other components within normal limits  RESP PANEL BY RT-PCR (RSV, FLU A&B, COVID)  [id number]  CULTURE, BLOOD (ROUTINE X 2)  CULTURE, BLOOD (ROUTINE X 2)  URINE CULTURE  TSH  LACTIC ACID, PLASMA  URINALYSIS, ROUTINE W REFLEX MICROSCOPIC  PROTIME-INR  APTT  PROCALCITONIN  MAGNESIUM  TROPONIN I (HIGH SENSITIVITY)  TROPONIN I (HIGH SENSITIVITY)     I ordered and reviewed the above labs they are notable for she has a increased white blood cell count of 19.4, which was normal in November.  Her H&H is decreased to 10.6/33.1 from 13.3/40.3 in November  EKG  ED ECG REPORT I, Lucillie Garfinkel, the attending physician, personally viewed and interpreted this ECG.   Date: 04/01/2022  EKG Time: 2300  Rate: 78  Rhythm: nsr  Axis: nl  Intervals:long qtc 538  ST&T Change: No acute ischemic changes    RADIOLOGY I independently reviewed and interpreted CT of the head and see no obvious bleeding or midline shift   PROCEDURES:  Critical Care performed: Yes, see critical care procedure note(s)  .Critical Care  Performed by: Lucillie Garfinkel, MD Authorized by: Lucillie Garfinkel, MD   Critical care provider statement:    Critical care time (minutes):  30   Critical care was time spent personally by me on the following activities:  Development of treatment plan with patient or surrogate, discussions with consultants, evaluation of patient's response to treatment, examination of patient, ordering and review of laboratory studies, ordering and review of radiographic studies, ordering and performing treatments and interventions, pulse oximetry, re-evaluation of patient's condition and review of old charts    MEDICATIONS ORDERED IN ED: Medications  lactated ringers infusion (has no administration in time range)  sodium chloride 0.9 % bolus 1,000 mL (has no  administration in time range)  linezolid (ZYVOX) IVPB 600 mg (has no administration in time range)  ceFEPIme (MAXIPIME) 2 g in sodium chloride 0.9 % 100 mL IVPB (has no administration in time range)  iohexol (OMNIPAQUE) 300 MG/ML solution 100 mL (has no administration in time range)  sodium chloride 0.9 % bolus 1,000 mL (1,000 mLs Intravenous New Bag/Given 04/01/22 2258)  External physician / consultants:  I spoke with respiratory therapy regarding care plan for this patient.   IMPRESSION / MDM / ASSESSMENT AND PLAN / ED COURSE  I reviewed the triage vital signs and the nursing notes.                                Patient's presentation is most consistent with acute presentation with potential threat to life or bodily function.  Differential diagnosis includes, but is not limited to, acute traumatic injury including fractures or dislocations of the pelvis, hand, or intracranial bleeding.  Electrolyte derangement or dehydration from her diarrhea.  Myasthenic crisis.  Infection like respiratory infection, urinary tract infection.   The patient is on the cardiac monitor to evaluate for evidence of arrhythmia and/or significant heart rate changes.  MDM: This is a patient with cognitive delay and myasthenia gravis with what appears to be an ambulatory mechanical slip and fall with traumatic injuries to the pelvis, hand, face.  Trauma imaging including CT head, neck, hand, pelvis.  She has had some diarrhea and an elevated white blood cell count looks clinically dehydrated will give IV fluid bolus.  Sister states that she is concern for myasthenic crisis that is often spurred on by infection.  She has no obvious infectious symptoms but will get chest x-ray and urinalysis given her markedly elevated white blood cell count today.  She also has a decreased H&H but no reports of GI bleeding and her rectal exam today shows no Bartolini blood or melena.   11:32 PM Notably patient's labs came back  markedly abnormal including lactic acidosis of 5+ and a white blood cell count of 19. The sister is concerned with these lab abnormalities that the patient has occult infection, she does have a history of perforated appendicitis that was not detected on physical exam due to her remarkable tolerance for pain.  Given these lab abnormalities and her hypotension I am concerned she is septic so will initiate sepsis evaluation including IV crystalloid bolus 30 cc/kg ideal body weight and broad-spectrum antibiotics and CT scan of the chest abdomen pelvis with IV contrast. She was found to have 5th metacarpal fracture so I ordered an ulnar gutter splint to be placed        FINAL CLINICAL IMPRESSION(S) / ED DIAGNOSES   Final diagnoses:  None     Rx / DC Orders   ED Discharge Orders     None        Note:  This document was prepared using Dragon voice recognition software and may include unintentional dictation errors.    Lucillie Garfinkel, MD 04/01/22 [id number]    Lucillie Garfinkel, MD 04/01/22 [phone]

## 2022-04-01 NOTE — ED Provider Notes (Signed)
11:30 PM  Assumed care at shift change.  Patient with myasthenia gravis with falls today.  Found to have leukocytosis of 19,000 and lactic acidosis.  Concern for possible sepsis of unknown origin.  CT scans, additional labs, urine, COVID and flu swab pending.  Getting IV fluids and broad-spectrum antibiotics.  Respiratory to obtain NIF and FVC.   2:40 AM  Patient had an NIF of -18 with good effort per respiratory therapy but they were unable to obtain forced vital capacity due to lack of comprehension.  She has not had any hypoxia and she is managing her secretions.  Discussed with critical care provider Enrigue Catena Rust NP who recommends repeating NIF, obtaining ABG to decide if patient does need nonemergent intubation for myasthenia gravis flare.  Her CT scans reviewed and interpreted by myself and the radiologist show hematoma to the gluteal area but no source of sepsis.  Is unclear what is causing her leukocytosis and lactic acidosis.  Lactic has improved with IV fluids.  We will need to obtain a rectal temperature and a catheterized urine specimen.  Procalcitonin is pending.   3:05 AM  NIF now -28.  ABG reassuring with no respiratory acidosis.  Seen by critical care.  They recommend admission to medicine service.  Rectal temperature, cath urine specimen pending to evaluate for possible sepsis.   3:26 AM  Rectal temp is normal.  Cath urine pending.  Procalcitonin pending.  Possible sepsis with unclear source.   Consulted and discussed patient's case with hospitalist, Dr. Sidney Ace.  I have recommended admission and consulting physician agrees and will place admission orders.  Patient (and family if present) agree with this plan.   I reviewed all nursing notes, vitals, pertinent previous records.  All labs, EKGs, imaging ordered have been independently reviewed and interpreted by myself.    CRITICAL CARE Performed by: Pryor Curia   Total critical care time: 40 minutes  Critical care  time was exclusive of separately billable procedures and treating other patients.  Critical care was necessary to treat or prevent imminent or life-threatening deterioration.  Critical care was time spent personally by me on the following activities: development of treatment plan with patient and/or surrogate as well as nursing, discussions with consultants, evaluation of patient's response to treatment, examination of patient, obtaining history from patient or surrogate, ordering and performing treatments and interventions, ordering and review of laboratory studies, ordering and review of radiographic studies, pulse oximetry and re-evaluation of patient's condition.    Mairim Bade, Delice Bison, DO 04/02/22 [phone]

## 2022-04-02 ENCOUNTER — Emergency Department: Payer: 59

## 2022-04-02 ENCOUNTER — Encounter: Payer: Self-pay | Admitting: Radiology

## 2022-04-02 DIAGNOSIS — Z66 Do not resuscitate: Secondary | ICD-10-CM | POA: Diagnosis present

## 2022-04-02 DIAGNOSIS — R197 Diarrhea, unspecified: Secondary | ICD-10-CM | POA: Diagnosis present

## 2022-04-02 DIAGNOSIS — Z85820 Personal history of malignant melanoma of skin: Secondary | ICD-10-CM | POA: Diagnosis not present

## 2022-04-02 DIAGNOSIS — A419 Sepsis, unspecified organism: Secondary | ICD-10-CM | POA: Diagnosis present

## 2022-04-02 DIAGNOSIS — Z6841 Body Mass Index (BMI) 40.0 and over, adult: Secondary | ICD-10-CM | POA: Diagnosis not present

## 2022-04-02 DIAGNOSIS — K219 Gastro-esophageal reflux disease without esophagitis: Secondary | ICD-10-CM | POA: Insufficient documentation

## 2022-04-02 DIAGNOSIS — G7000 Myasthenia gravis without (acute) exacerbation: Secondary | ICD-10-CM | POA: Diagnosis present

## 2022-04-02 DIAGNOSIS — R296 Repeated falls: Secondary | ICD-10-CM

## 2022-04-02 DIAGNOSIS — I10 Essential (primary) hypertension: Secondary | ICD-10-CM | POA: Diagnosis present

## 2022-04-02 DIAGNOSIS — E876 Hypokalemia: Secondary | ICD-10-CM | POA: Diagnosis present

## 2022-04-02 DIAGNOSIS — Z85528 Personal history of other malignant neoplasm of kidney: Secondary | ICD-10-CM | POA: Diagnosis not present

## 2022-04-02 DIAGNOSIS — I959 Hypotension, unspecified: Secondary | ICD-10-CM | POA: Diagnosis present

## 2022-04-02 DIAGNOSIS — S62357A Nondisplaced fracture of shaft of fifth metacarpal bone, left hand, initial encounter for closed fracture: Secondary | ICD-10-CM | POA: Diagnosis present

## 2022-04-02 DIAGNOSIS — E878 Other disorders of electrolyte and fluid balance, not elsewhere classified: Secondary | ICD-10-CM | POA: Diagnosis present

## 2022-04-02 DIAGNOSIS — D62 Acute posthemorrhagic anemia: Secondary | ICD-10-CM | POA: Diagnosis present

## 2022-04-02 DIAGNOSIS — S0083XA Contusion of other part of head, initial encounter: Secondary | ICD-10-CM | POA: Diagnosis present

## 2022-04-02 DIAGNOSIS — S62346A Nondisplaced fracture of base of fifth metacarpal bone, right hand, initial encounter for closed fracture: Secondary | ICD-10-CM | POA: Diagnosis not present

## 2022-04-02 DIAGNOSIS — I480 Paroxysmal atrial fibrillation: Secondary | ICD-10-CM | POA: Diagnosis present

## 2022-04-02 DIAGNOSIS — Z1152 Encounter for screening for COVID-19: Secondary | ICD-10-CM | POA: Diagnosis not present

## 2022-04-02 DIAGNOSIS — E222 Syndrome of inappropriate secretion of antidiuretic hormone: Secondary | ICD-10-CM | POA: Diagnosis present

## 2022-04-02 DIAGNOSIS — E871 Hypo-osmolality and hyponatremia: Secondary | ICD-10-CM | POA: Diagnosis not present

## 2022-04-02 DIAGNOSIS — D72829 Elevated white blood cell count, unspecified: Secondary | ICD-10-CM | POA: Diagnosis present

## 2022-04-02 DIAGNOSIS — W010XXA Fall on same level from slipping, tripping and stumbling without subsequent striking against object, initial encounter: Secondary | ICD-10-CM | POA: Diagnosis present

## 2022-04-02 DIAGNOSIS — E039 Hypothyroidism, unspecified: Secondary | ICD-10-CM | POA: Diagnosis present

## 2022-04-02 DIAGNOSIS — E860 Dehydration: Secondary | ICD-10-CM | POA: Diagnosis present

## 2022-04-02 DIAGNOSIS — E7800 Pure hypercholesterolemia, unspecified: Secondary | ICD-10-CM | POA: Diagnosis present

## 2022-04-02 DIAGNOSIS — E872 Acidosis, unspecified: Secondary | ICD-10-CM | POA: Diagnosis present

## 2022-04-02 DIAGNOSIS — E861 Hypovolemia: Secondary | ICD-10-CM | POA: Diagnosis present

## 2022-04-02 LAB — BASIC METABOLIC PANEL
Anion gap: 5 (ref 5–15)
BUN: 18 mg/dL (ref 8–23)
CO2: 22 mmol/L (ref 22–32)
Calcium: 7.5 mg/dL — ABNORMAL LOW (ref 8.9–10.3)
Chloride: 100 mmol/L (ref 98–111)
Creatinine, Ser: 0.88 mg/dL (ref 0.44–1.00)
GFR, Estimated: >60 mL/min (ref >60)
Glucose, Bld: 132 mg/dL — ABNORMAL HIGH (ref 70–99)
Potassium: 3.4 mmol/L — ABNORMAL LOW (ref 3.5–5.1)
Sodium: 127 mmol/L — ABNORMAL LOW (ref 135–145)

## 2022-04-02 LAB — CBC
HCT: 23.7 % — ABNORMAL LOW (ref 36.0–46.0)
Hemoglobin: 7.8 g/dL — ABNORMAL LOW (ref 12.0–15.0)
MCH: 28.9 pg (ref 26.0–34.0)
MCHC: 32.9 g/dL (ref 30.0–36.0)
MCV: 87.8 fL (ref 80.0–100.0)
Platelets: 122 10*3/uL — ABNORMAL LOW (ref 150–400)
RBC: 2.7 MIL/uL — ABNORMAL LOW (ref 3.87–5.11)
RDW: 16.3 % — ABNORMAL HIGH (ref 11.5–15.5)
WBC: 9.5 10*3/uL (ref 4.0–10.5)
nRBC: 0 % (ref 0.0–0.2)

## 2022-04-02 LAB — URINALYSIS, ROUTINE W REFLEX MICROSCOPIC
Bilirubin Urine: NEGATIVE
Glucose, UA: NEGATIVE mg/dL
Hgb urine dipstick: NEGATIVE
Ketones, ur: NEGATIVE mg/dL
Leukocytes,Ua: NEGATIVE
Nitrite: NEGATIVE
Protein, ur: NEGATIVE mg/dL
Specific Gravity, Urine: 1.036 — ABNORMAL HIGH (ref 1.005–1.030)
pH: 5 (ref 5.0–8.0)

## 2022-04-02 LAB — BLOOD GAS, ARTERIAL
Acid-base deficit: 2.3 mmol/L — ABNORMAL HIGH (ref 0.0–2.0)
Bicarbonate: 21.4 mmol/L (ref 20.0–28.0)
O2 Saturation: 98.5 %
Patient temperature: 37
pCO2 arterial: 33 mmHg (ref 32–48)
pH, Arterial: 7.42 (ref 7.35–7.45)
pO2, Arterial: 90 mmHg (ref 83–108)

## 2022-04-02 LAB — RESP PANEL BY RT-PCR (RSV, FLU A&B, COVID)  RVPGX2
Influenza A by PCR: NEGATIVE
Influenza B by PCR: NEGATIVE
Resp Syncytial Virus by PCR: NEGATIVE
SARS Coronavirus 2 by RT PCR: NEGATIVE

## 2022-04-02 LAB — MAGNESIUM
Magnesium: 1.6 mg/dL — ABNORMAL LOW (ref 1.7–2.4)
Magnesium: 1.6 mg/dL — ABNORMAL LOW (ref 1.7–2.4)

## 2022-04-02 LAB — CORTISOL-AM, BLOOD: Cortisol - AM: 39.5 ug/dL — ABNORMAL HIGH (ref 6.7–22.6)

## 2022-04-02 LAB — TROPONIN I (HIGH SENSITIVITY): Troponin I (High Sensitivity): 25 ng/L — ABNORMAL HIGH (ref <18)

## 2022-04-02 LAB — APTT: aPTT: 25 seconds (ref 24–36)

## 2022-04-02 LAB — LACTIC ACID, PLASMA
Lactic Acid, Venous: 2.3 mmol/L (ref 0.5–1.9)
Lactic Acid, Venous: 1 mmol/L (ref 0.5–1.9)

## 2022-04-02 LAB — PROCALCITONIN
Procalcitonin: <0.1 ng/mL
Procalcitonin: <0.1 ng/mL

## 2022-04-02 LAB — PROTIME-INR
INR: 1 (ref 0.8–1.2)
INR: 1.1 (ref 0.8–1.2)
Prothrombin Time: 13.5 seconds (ref 11.4–15.2)
Prothrombin Time: 14.3 seconds (ref 11.4–15.2)

## 2022-04-02 LAB — HIV ANTIBODY (ROUTINE TESTING W REFLEX): HIV Screen 4th Generation wRfx: NONREACTIVE

## 2022-04-02 MED ORDER — DESMOPRESSIN ACETATE SPRAY 0.01 % NA SOLN
10.0000 ug | Freq: Two times a day (BID) | NASAL | Status: DC
  Administered 2022-04-02 – 2022-04-04 (×5): 10 ug via NASAL
  Filled 2022-04-02 (×2): qty 5

## 2022-04-02 MED ORDER — B COMPLEX-C PO TABS
1.0000 | ORAL_TABLET | Freq: Every evening | ORAL | Status: DC
  Administered 2022-04-02 – 2022-04-09 (×8): 1 via ORAL
  Filled 2022-04-02 (×10): qty 1

## 2022-04-02 MED ORDER — ONDANSETRON HCL 4 MG PO TABS: 4.0000 mg | ORAL_TABLET | Freq: Four times a day (QID) | ORAL | Status: DC | PRN

## 2022-04-02 MED ORDER — ENOXAPARIN SODIUM 60 MG/0.6ML IJ SOSY
0.5000 mg/kg | PREFILLED_SYRINGE | INTRAMUSCULAR | Status: DC
  Administered 2022-04-02 – 2022-04-07 (×6): 52.5 mg via SUBCUTANEOUS
  Filled 2022-04-02 (×6): qty 0.6

## 2022-04-02 MED ORDER — DULOXETINE HCL 30 MG PO CPEP
30.0000 mg | ORAL_CAPSULE | Freq: Every day | ORAL | Status: DC
  Administered 2022-04-02 – 2022-04-10 (×9): 30 mg via ORAL
  Filled 2022-04-02 (×9): qty 1

## 2022-04-02 MED ORDER — RISAQUAD PO CAPS
1.0000 | ORAL_CAPSULE | Freq: Two times a day (BID) | ORAL | Status: DC
  Administered 2022-04-02 – 2022-04-10 (×17): 1 via ORAL
  Filled 2022-04-02 (×17): qty 1

## 2022-04-02 MED ORDER — SODIUM CHLORIDE 0.9 % IV SOLN: 2.0000 g | Freq: Two times a day (BID) | INTRAVENOUS | Status: DC

## 2022-04-02 MED ORDER — METRONIDAZOLE 500 MG/100ML IV SOLN
500.0000 mg | Freq: Two times a day (BID) | INTRAVENOUS | Status: DC
  Administered 2022-04-02: 500 mg via INTRAVENOUS
  Filled 2022-04-02: qty 100

## 2022-04-02 MED ORDER — RISPERIDONE 0.5 MG PO TABS
1.0000 mg | ORAL_TABLET | Freq: Every day | ORAL | Status: DC
  Administered 2022-04-02 – 2022-04-09 (×8): 1 mg via ORAL
  Filled 2022-04-02 (×9): qty 2

## 2022-04-02 MED ORDER — BENZONATATE 100 MG PO CAPS: 100.0000 mg | ORAL_CAPSULE | Freq: Two times a day (BID) | ORAL | Status: DC | PRN

## 2022-04-02 MED ORDER — BIOTIN 10000 MCG PO TABS: 10000.0000 ug | ORAL_TABLET | Freq: Every day | ORAL | Status: DC

## 2022-04-02 MED ORDER — TRAZODONE HCL 50 MG PO TABS: 25.0000 mg | ORAL_TABLET | Freq: Every evening | ORAL | Status: DC | PRN

## 2022-04-02 MED ORDER — AMIODARONE HCL 200 MG PO TABS
200.0000 mg | ORAL_TABLET | Freq: Every day | ORAL | Status: DC
  Administered 2022-04-02 – 2022-04-10 (×9): 200 mg via ORAL
  Filled 2022-04-02 (×9): qty 1

## 2022-04-02 MED ORDER — MAGNESIUM HYDROXIDE 400 MG/5ML PO SUSP
30.0000 mL | Freq: Every day | ORAL | Status: DC | PRN
  Administered 2022-04-09: 30 mL via ORAL
  Filled 2022-04-02: qty 30

## 2022-04-02 MED ORDER — VITAMIN E 45 MG (100 UNIT) PO CAPS
400.0000 [IU] | ORAL_CAPSULE | Freq: Every day | ORAL | Status: DC
  Administered 2022-04-02 – 2022-04-09 (×8): 400 [IU] via ORAL
  Filled 2022-04-02 (×10): qty 4

## 2022-04-02 MED ORDER — OYSTER SHELL CALCIUM/D3 500-5 MG-MCG PO TABS
ORAL_TABLET | Freq: Every day | ORAL | Status: DC
  Administered 2022-04-02 – 2022-04-10 (×9): 1 via ORAL
  Filled 2022-04-02 (×9): qty 1

## 2022-04-02 MED ORDER — ACETAMINOPHEN 650 MG RE SUPP: 650.0000 mg | Freq: Four times a day (QID) | RECTAL | Status: DC | PRN

## 2022-04-02 MED ORDER — DILTIAZEM HCL ER COATED BEADS 180 MG PO CP24
180.0000 mg | ORAL_CAPSULE | Freq: Every day | ORAL | Status: DC
  Administered 2022-04-02 – 2022-04-10 (×9): 180 mg via ORAL
  Filled 2022-04-02 (×9): qty 1

## 2022-04-02 MED ORDER — FESOTERODINE FUMARATE ER 4 MG PO TB24
4.0000 mg | ORAL_TABLET | Freq: Every day | ORAL | Status: DC
  Administered 2022-04-02 – 2022-04-10 (×9): 4 mg via ORAL
  Filled 2022-04-02 (×9): qty 1

## 2022-04-02 MED ORDER — SODIUM CHLORIDE 0.9 % IV BOLUS (SEPSIS)
1000.0000 mL | Freq: Once | INTRAVENOUS | Status: AC
  Administered 2022-04-02: 1000 mL via INTRAVENOUS

## 2022-04-02 MED ORDER — POTASSIUM CHLORIDE CRYS ER 20 MEQ PO TBCR
40.0000 meq | EXTENDED_RELEASE_TABLET | ORAL | Status: AC
  Administered 2022-04-02 (×2): 40 meq via ORAL
  Filled 2022-04-02 (×2): qty 2

## 2022-04-02 MED ORDER — LINEZOLID 600 MG/300ML IV SOLN
600.0000 mg | Freq: Two times a day (BID) | INTRAVENOUS | Status: DC
  Filled 2022-04-02: qty 300

## 2022-04-02 MED ORDER — POTASSIUM CHLORIDE CRYS ER 20 MEQ PO TBCR
40.0000 meq | EXTENDED_RELEASE_TABLET | Freq: Once | ORAL | Status: AC
  Administered 2022-04-02: 40 meq via ORAL
  Filled 2022-04-02: qty 2

## 2022-04-02 MED ORDER — PANTOPRAZOLE SODIUM 40 MG PO TBEC
40.0000 mg | DELAYED_RELEASE_TABLET | Freq: Every day | ORAL | Status: DC
  Administered 2022-04-02 – 2022-04-10 (×9): 40 mg via ORAL
  Filled 2022-04-02 (×9): qty 1

## 2022-04-02 MED ORDER — ONDANSETRON HCL 4 MG/2ML IJ SOLN: 4.0000 mg | Freq: Four times a day (QID) | INTRAMUSCULAR | Status: DC | PRN

## 2022-04-02 MED ORDER — LEVOTHYROXINE SODIUM 50 MCG PO TABS
75.0000 ug | ORAL_TABLET | Freq: Every day | ORAL | Status: DC
  Administered 2022-04-02 – 2022-04-10 (×9): 75 ug via ORAL
  Filled 2022-04-02 (×3): qty 1
  Filled 2022-04-02: qty 2
  Filled 2022-04-02 (×5): qty 1

## 2022-04-02 MED ORDER — SODIUM CHLORIDE 0.9 % IV BOLUS (SEPSIS)
500.0000 mL | Freq: Once | INTRAVENOUS | Status: AC
  Administered 2022-04-02: 500 mL via INTRAVENOUS

## 2022-04-02 MED ORDER — PYRIDOSTIGMINE BROMIDE 60 MG PO TABS
60.0000 mg | ORAL_TABLET | Freq: Three times a day (TID) | ORAL | Status: DC
  Administered 2022-04-02 – 2022-04-10 (×24): 60 mg via ORAL
  Filled 2022-04-02 (×28): qty 1

## 2022-04-02 MED ORDER — SODIUM CHLORIDE 0.9 % IV SOLN: 2.0000 g | Freq: Three times a day (TID) | INTRAVENOUS | Status: DC

## 2022-04-02 MED ORDER — VITAMIN C 500 MG PO TABS
1000.0000 mg | ORAL_TABLET | Freq: Every day | ORAL | Status: DC
  Administered 2022-04-02 – 2022-04-09 (×8): 1000 mg via ORAL
  Filled 2022-04-02 (×8): qty 2

## 2022-04-02 MED ORDER — FOLIC ACID 1 MG PO TABS
1.0000 mg | ORAL_TABLET | Freq: Every day | ORAL | Status: DC
  Administered 2022-04-02 – 2022-04-10 (×9): 1 mg via ORAL
  Filled 2022-04-02 (×9): qty 1

## 2022-04-02 MED ORDER — POTASSIUM CHLORIDE 10 MEQ/100ML IV SOLN: 10.0000 meq | Freq: Once | INTRAVENOUS | Status: DC

## 2022-04-02 MED ORDER — ACETAMINOPHEN 325 MG PO TABS
650.0000 mg | ORAL_TABLET | Freq: Four times a day (QID) | ORAL | Status: DC | PRN
  Administered 2022-04-03 – 2022-04-07 (×5): 650 mg via ORAL
  Filled 2022-04-02 (×5): qty 2

## 2022-04-02 MED ORDER — MAGNESIUM SULFATE 2 GM/50ML IV SOLN
2.0000 g | Freq: Once | INTRAVENOUS | Status: AC
  Administered 2022-04-02: 2 g via INTRAVENOUS
  Filled 2022-04-02: qty 50

## 2022-04-02 NOTE — H&P (Addendum)
[facility]   PATIENT NAME: Madison Burns    MR#:  [id number]  DATE OF BIRTH:  [birth year]  DATE OF ADMISSION:  04/01/2022  PRIMARY CARE PHYSICIAN: Einar Pheasant, MD   Patient is coming from: Home  REQUESTING/REFERRING PHYSICIAN: Ward, Delice Bison, DO  CHIEF COMPLAINT:   Chief Complaint  Patient presents with   Fall   Weakness    HISTORY OF PRESENT ILLNESS:  Madison Burns is a 72 y.o. Caucasian female with medical history significant for myasthenia gravis, GERD, hypertension, dyslipidemia, hypothyroidism, IBS, OSA on CPAP, who presented to the emergency room with acute onset of recurrent falls.  She had a couple falls with subsequent head injury and facial contusion mainly in the nose.  She stated that she tripped and fell.  No presyncope or syncope.  No headache or dizziness or blurred vision.  No paresthesias or focal muscle weakness.  No weakness seizures.  She had subsequent left hand swelling and pain and was found to have fifth metacarpal fracture.  No dysuria, oliguria or hematuria, urgency or frequency or flank pain.  ED Course: When she came to the ER, BP was 98/52 and temperature 97.4 with otherwise normal vital signs.  BP later on was 113/65.  She did not require any oxygen.  ABG was unremarkable.  CMP was remarkable for hyponatremia and hypochloremia, hypokalemia and a BUN of 24 with creatinine 1.23, magnesium 1.6 and high sensitive troponin I was 8.  Lactic acid was 5.2 and later 2.3 and procalcitonin less than 0.1.  CBC showed leukocytosis 19.4 with neutrophilia, anemia that is new.  Coag profile was within normal.  TSH was 4.34 and free T41.29. EKG as reviewed by me : Normal sinus rhythm with a rate of 78 with suspected left atrial enlargement, LVH and prolonged QT interval. Imaging: Portable chest x-ray showed no acute cardiopulmonary disease.  It showed mild right perihilar distortion that is stable.  Left hand x-ray showed acute to subacute nondisplaced fracture  involving the proximal shaft of the fifth metacarpal.  Abdominal and pelvic CT scan revealed the following: 1. 12.7 cm intramuscular and subcutaneous hematoma within the right gluteal region. Extensive overlying subcutaneous edema. 2. Stable post treatment change within the anterior mediastinum. 3. Stable post ablated changes within the lower pole of the left kidney. 4. Moderate sigmoid diverticulosis without superimposed acute inflammatory change.   Aortic Atherosclerosis.  The patient was given IV Zyvox and cefepime, 2 L bolus of IV normal saline and later 150 mill per hour of lactated Ringer as well as 40 mEq p.o. potassium chloride.  She will be admitted to a medical telemetry bed for further evaluation and management. PAST MEDICAL HISTORY:   Past Medical History:  Diagnosis Date   Cancer of kidney ([place]) [id number]   Complication of anesthesia    Myasthenia gravis   GERD (gastroesophageal reflux disease)    History of seizure disorder    Hypercholesterolemia    Hypertension    no longer has it. wt loss   Hypothyroidism    Irritable bowel syndrome    Melanoma ([place]) 2017   Melanoma on neck   Myasthenia gravis ([place]) 2011   OSA on CPAP    Thymoma, malignant ([place])     PAST SURGICAL HISTORY:   Past Surgical History:  Procedure Laterality Date   ABDOMINAL HYSTERECTOMY  1990   APPENDECTOMY  1999   CHOLECYSTECTOMY  2013   COLONOSCOPY WITH PROPOFOL N/A 06/30/2017   Procedure: COLONOSCOPY WITH PROPOFOL;  Surgeon: Toledo, Benay Pike, MD;  Location: [facility];  Service: Gastroenterology;  Laterality: N/A;   CYSTOSCOPY W/ URETERAL STENT PLACEMENT Left 08/17/2019   Procedure: CYSTOSCOPY WITH RETROGRADE PYELOGRAM/URETERAL STENT PLACEMENT;  Surgeon: Alexis Frock, MD;  Location: [facility];  Service: Urology;  Laterality: Left;  30 MINS   ESOPHAGOGASTRODUODENOSCOPY (EGD) WITH PROPOFOL N/A 06/30/2017   Procedure: ESOPHAGOGASTRODUODENOSCOPY (EGD) WITH PROPOFOL;  Surgeon: Toledo, Benay Pike, MD;   Location: [facility];  Service: Gastroenterology;  Laterality: N/A;   IR RADIOLOGIST EVAL & MGMT  06/28/2019   IR RADIOLOGIST EVAL & MGMT  09/14/2019   IR RADIOLOGIST EVAL & MGMT  03/28/2020   IR RADIOLOGIST EVAL & MGMT  10/10/2020   IR RADIOLOGIST EVAL & MGMT  11/22/2021   RADIOFREQUENCY ABLATION Left 08/17/2019   Procedure: LEFT RENAL CRYO ABLATION;  Surgeon: Jacqulynn Cadet, MD;  Location: [facility];  Service: Anesthesiology;  Laterality: Left;   TONSILLECTOMY  1959    SOCIAL HISTORY:   Social History   Tobacco Use   Smoking status: Never   Smokeless tobacco: Never  Substance Use Topics   Alcohol use: No    Alcohol/week: 0.0 standard drinks of alcohol    FAMILY HISTORY:   Family History  Problem Relation Age of Onset   Colon cancer Maternal Grandfather        stomach/colon   Heart disease Father        myocardial infarction   Hyperlipidemia Father    Hyperlipidemia Sister    Diabetes Sister    Bone cancer Other        cousin   Alzheimer's disease Mother        maternal aunts   Skin cancer Mother    Myasthenia gravis Brother        ocular   Skin cancer Brother    Stroke Paternal Grandfather    Breast cancer Neg Hx     DRUG ALLERGIES:  No Known Allergies  REVIEW OF SYSTEMS:   ROS As per history of present illness. All pertinent systems were reviewed above. Constitutional, HEENT, cardiovascular, respiratory, GI, GU, musculoskeletal, neuro, psychiatric, endocrine, integumentary and hematologic systems were reviewed and are otherwise negative/unremarkable except for positive findings mentioned above in the HPI.   MEDICATIONS AT HOME:   Prior to Admission medications   Medication Sig Start Date End Date Taking? Authorizing Provider  acetaminophen (TYLENOL) 325 MG tablet Take 2 tablets (650 mg total) by mouth every 6 (six) hours as needed for mild pain (or Fever >/= 101). 04/13/20   Cherene Altes, MD  amiodarone (PACERONE) 200 MG tablet Take 1 tablet (200 mg  total) by mouth daily. 04/14/20   Cherene Altes, MD  Ascorbic Acid (VITAMIN C) 1000 MG tablet Take 1,000 mg by mouth at bedtime.    [provider]  B Complex-C (B-COMPLEX WITH VITAMIN C) tablet Take 1 tablet by mouth every evening.    [provider]  benzonatate (TESSALON) 100 MG capsule TAKE 1 CAPSULE BY MOUTH TWICE DAILY AS NEEDED FOR COUGH 04/01/22   Einar Pheasant, MD  Biotin 10000 MCG TABS Take 10,000 mcg by mouth daily at 2 PM. (1430)    [provider]  Calcium-Phosphorus-Vitamin D (CITRACAL +D3 PO) Take 1 tablet by mouth daily.    [provider]  desmopressin (DDAVP NASAL) 0.01 % solution Place 1 spray (10 mcg total) into the nose 2 (two) times daily. 09/04/21   Dohmeier, Asencion Partridge, MD  diltiazem (CARDIZEM CD) 180 MG 24 hr capsule  Take by mouth. 12/04/21   [provider]  DULoxetine (CYMBALTA) 30 MG capsule TAKE 1 CAPSULE BY MOUTH ONCE DAILY 04/01/22   Earlie Server, MD  folic acid (FOLVITE) 1 MG tablet TAKE 2 TABLETS BY MOUTH ONCE DAILY 01/14/22   Lomax, Amy, NP  Garlic 123XX123 MG CAPS Take 1,000 mg by mouth in the morning, at noon, and at bedtime. (0800, 1430, 2000)    [provider]  levothyroxine (SYNTHROID) 75 MCG tablet TAKE 1 TABLET BY MOUTH ONCE DAILY ON AN EMPTY STOMACH. WAIT 30 MINUTES BEFORE TAKING OTHER MEDS. 11/28/21   Einar Pheasant, MD  methotrexate (RHEUMATREX) 2.5 MG tablet TAKE 4 TABLETS BY MOUTH ONCE A WEEK WITHFOLIC ACID 123456   Dohmeier, Asencion Partridge, MD  pantoprazole (PROTONIX) 40 MG tablet Take 40 mg by mouth daily.  11/24/17   [provider]  Probiotic Product (PROBIOTIC MULTI-ENZYME PO) Take 1 capsule by mouth in the morning and at bedtime. Probiotic Multi-Enzyme Digestive Formula    [provider]  pyridostigmine (MESTINON) 60 MG tablet TAKE 1 TABLET BY MOUTH 3 TIMES DAILY 01/23/22   Dohmeier, Asencion Partridge, MD  risperiDONE (RISPERDAL) 1 MG tablet Take 1 mg by mouth at bedtime.  07/08/18   [provider]  tolterodine (DETROL LA) 4 MG 24 hr capsule TAKE 1 CAPSULE BY MOUTH ONCE EVERY MORNING 12/20/21   Einar Pheasant, MD  vitamin E 180 MG (400 UNITS) capsule Take 400 Units by mouth daily at 2 PM. 1430    [provider]      VITAL SIGNS:  Blood pressure (!) 100/48, pulse 89, temperature 100.2 F (37.9 C), temperature source Oral, resp. rate 18, height 5' 4"$  (1.626 m), weight 106 kg, SpO2 99 %.  PHYSICAL EXAMINATION:  Physical Exam  GENERAL:  72 y.o.-year-old Caucasian female patient lying in the bed with no acute distress.  EYES: Pupils equal, round, reactive to light and accommodation. No scleral icterus. Extraocular muscles intact.  HEENT: Head with nasal contusion and bluish discoloration otherwise normocephalic. Oropharynx and nasopharynx clear.  NECK:  Supple, no jugular venous distention. No thyroid enlargement, no tenderness.  LUNGS: Normal breath sounds bilaterally, no wheezing, rales,rhonchi or crepitation. No use of accessory muscles of respiration.  CARDIOVASCULAR: Regular rate and rhythm, S1, S2 normal. No murmurs, rubs, or gallops.  ABDOMEN: Soft, nondistended, nontender. Bowel sounds present. No organomegaly or mass.  EXTREMITIES: No pedal edema, cyanosis, or clubbing. Musculoskeletal: Left hand in splint. NEUROLOGIC: Cranial nerves II through XII are intact. Muscle strength 5/5 in all extremities. Sensation intact. Gait not checked.  PSYCHIATRIC: The patient is alert and oriented x 3.  Normal affect and good eye contact. SKIN: No obvious rash, lesion, or ulcer.   LABORATORY PANEL:   CBC Recent Labs  Lab 04/06/22 1651  WBC 7.3  HGB 7.3*  HCT 22.4*  PLT 206   ------------------------------------------------------------------------------------------------------------------  Chemistries  Recent Labs  Lab 04/01/22 2210 04/02/22 0610 04/03/22 0841 04/03/22 1308 04/06/22 0611 04/06/22 1651  NA 124*   < >  --    < > 132* 131*  K 3.2*   < >  --    < >  4.3  --   CL 89*   < >  --    < > 102  --   CO2 20*   < >  --    < > 25  --   GLUCOSE 193*   < >  --    < > 116*  --  BUN 24*   < >  --    < > 12  --   CREATININE 1.23*   < >  --    < > 0.79  --   CALCIUM 8.2*   < >  --    < > 7.7*  --   MG 1.6*   < > 2.0  --   --   --   AST 35  --   --   --   --   --   ALT 19  --   --   --   --   --   ALKPHOS 55  --   --   --   --   --   BILITOT 0.6  --   --   --   --   --    < > = values in this interval not displayed.   ------------------------------------------------------------------------------------------------------------------  Cardiac Enzymes No results for input(s): "TROPONINI" in the last 168 hours. ------------------------------------------------------------------------------------------------------------------  RADIOLOGY:  No results found.    IMPRESSION AND PLAN:  Assessment and Plan: * Sepsis due to undetermined organism [facility]) - This is manifested by mild tachypnea and significant leukocytosis.  She may meet severe sepsis criteria with significantly elevated lactic acid level.  The etiology is unclear at this time. - She will be admitted to a medical telemetry bed. - We will follow blood cultures. - We will continue antibiotic therapy with IV Zyvox and cefepime as well as Flagyl. - We will continue hydration with IV normal saline with added potassium chloride.  Frequent falls - This could be related to generalized weakness and possibly #1. - PT consult will be obtained.  Nondisplaced fracture of shaft of fifth metacarpal bone, left hand, initial encounter for closed fracture - Orthopedic consult will be obtained. Dr Sharlet Salina was notified. - Pain management will be provided. - The patient had her left hand splinted.  GERD without esophagitis - We will continue PPI therapy.  Paroxysmal atrial fibrillation (HCC) - We will continue amiodarone. - The patient is obviously high fall risk and therefore is not on any  anticoagulations.  Hypothyroidism - We will continue Synthroid.  Myasthenia gravis ([place]) - We will continue her pyridostigmine.       DVT prophylaxis: Lovenox. Advanced Care Planning:  Code Status: She is DNR but wants intubation if she has pulse.  Family Communication:  The plan of care was discussed in details with the patient (and family). I answered all questions. The patient agreed to proceed with the above mentioned plan. Further management will depend upon hospital course. Disposition Plan: Back to previous home environment Consults called: Orthopedic consult. All the records are reviewed and case discussed with ED provider.  Status is: Inpatient   At the time of the admission, it appears that the appropriate admission status for this patient is inpatient.  This is judged to be reasonable and necessary in order to provide the required intensity of service to ensure the patient's safety given the presenting symptoms, physical exam findings and initial radiographic and laboratory data in the context of comorbid conditions.  The patient requires inpatient status due to high intensity of service, high risk of further deterioration and high frequency of surveillance required.  I certify that at the time of admission, it is my clinical judgment that the patient will require inpatient hospital care extending more than 2 midnights.  Dispo: The patient is from: Home              Anticipated d/c is to: Home              Patient currently is not medically stable to d/c.              Difficult to place patient: No  Christel Mormon M.D on 04/07/2022 at 12:40 AM  [facility]   From 7 PM-7 AM, contact night-coverage [email or web address]  CC: Primary care physician; Einar Pheasant, MD

## 2022-04-02 NOTE — Progress Notes (Signed)
Anticoagulation monitoring(Lovenox):  72 yo  female ordered Lovenox 40 mg Q24h    Filed Weights   04/01/22 2140  Weight: 106 kg (233 lb 11 oz)   BMI 40   Lab Results  Component Value Date   CREATININE 1.23 (H) 04/01/2022   CREATININE 0.97 01/08/2022   CREATININE 1.10 07/25/2021   Estimated Creatinine Clearance: 50.5 mL/min (A) (by C-G formula based on SCr of 1.23 mg/dL (H)). Hemoglobin & Hematocrit     Component Value Date/Time   HGB 10.6 (L) 04/01/2022 2210   HGB 13.7 03/06/2021 1407   HCT 33.1 (L) 04/01/2022 2210   HCT 40.4 03/06/2021 1407     Per Protocol for Patient with estCrcl > 30 ml/min and BMI > 30, will transition to Lovenox 52.5 mg Q24h.

## 2022-04-02 NOTE — ED Notes (Signed)
Pt urinated on self and bed.  Clean sheets, diaper and purewick applied.  Peri care provided.

## 2022-04-02 NOTE — Progress Notes (Signed)
Pharmacy Antibiotic Note  Madison Burns is a 72 y.o. female admitted on 04/01/2022 with sepsis.  Pharmacy has been consulted for Cefepime dosing.  Plan: Cefepime 2 gm IV X 1 given in ED on 2/14 @ 0010. Cefepime 2 gm IV Q12H ordered to continue on 2/14 @ 1200.   Height: 5' 4"$  (162.6 cm) Weight: 106 kg (233 lb 11 oz) IBW/kg (Calculated) : 54.7  Temp (24hrs), Avg:97.7 F (36.5 C), Min:97.4 F (36.3 C), Max:97.9 F (36.6 C)  Recent Labs  Lab 04/01/22 2139 04/01/22 2210 04/02/22 0120  WBC  --  19.4*  --   CREATININE  --  1.23*  --   LATICACIDVEN 5.2*  --  2.3*    Estimated Creatinine Clearance: 50.5 mL/min (A) (by C-G formula based on SCr of 1.23 mg/dL (H)).    No Known Allergies  Antimicrobials this admission:   >>    >>   Dose adjustments this admission:   Microbiology results:  BCx:   UCx:    Sputum:    MRSA PCR:   Thank you for allowing pharmacy to be a part of this patient's care.  Muzamil Harker D 04/02/2022 3:52 AM

## 2022-04-02 NOTE — Progress Notes (Signed)
NIF best of 3 with good effort -28

## 2022-04-02 NOTE — Progress Notes (Signed)
NIF -30 best of 3 with good effort

## 2022-04-02 NOTE — ED Notes (Signed)
Assisted pt with setting up lunch tray, this rn observed that pt is able to feed herself adequately.

## 2022-04-02 NOTE — Progress Notes (Signed)

## 2022-04-02 NOTE — Progress Notes (Signed)
PROGRESS NOTE    [name]  [id number] DOB: Aug 01, 1950 DOA: 04/01/2022 PCP: Einar Pheasant, MD   Brief Narrative:  Madison Burns is a 72 y.o. Caucasian female with medical history significant for myasthenia gravis, GERD, hypertension, dyslipidemia, hypothyroidism, IBS, OSA on CPAP, who presented to the emergency room with a [name] of recurrent falls.  She had a couple falls with subsequent head injury and facial contusion mainly in the nose.  She stated that she tripped and fell.  No presyncope or syncope.  No headache or dizziness or blurred vision.  No paresthesias or focal muscle weakness.  No weakness seizures.  She had subsequent left hand swelling and pain and was found to have fifth metacarpal fracture.  No dysuria, oliguria or hematuria, urgency or frequency or flank pain.   ED Course: When she came to the ER, BP was 98/52 and temperature 97.4 with otherwise normal vital signs.  BP later on was 113/65.  She did not require any oxygen.  ABG was unremarkable.  CMP was remarkable for hyponatremia and hypochloremia, hypokalemia and a BUN of 24 with creatinine 1.23, magnesium 1.6 and high sensitive troponin I was 8.  Lactic acid was 5.2 and later 2.3 and procalcitonin less than 0.1.  CBC showed leukocytosis 19.4 with neutrophilia, anemia that is new.  Coag profile was within normal.  TSH was 4.34 and free T41.29. EKG as reviewed by me : Normal sinus rhythm with a rate of 78 with suspected left atrial enlargement, LVH and prolonged QT interval. Imaging: Portable chest x-ray showed no acute cardiopulmonary disease.  It showed mild right perihilar distortion that is stable.  Left hand x-ray showed acute to subacute nondisplaced fracture involving the proximal shaft of the fifth metacarpal.  Abdominal and pelvic CT scan revealed the following: 1. 12.7 cm intramuscular and subcutaneous hematoma within the right gluteal region. Extensive overlying subcutaneous edema. 2. Stable post  treatment change within the anterior mediastinum. 3. Stable post ablated changes within the lower pole of the left kidney. 4. Moderate sigmoid diverticulosis without superimposed acute inflammatory change.  Assessment & Plan:   Principal Problem:   Sepsis due to undetermined organism [facility]) Active Problems:   Frequent falls   Nondisplaced fracture of shaft of fifth metacarpal bone, left hand, initial encounter for closed fracture   Myasthenia gravis ([place])   Hypothyroidism   Paroxysmal atrial fibrillation (HCC)   GERD without esophagitis  Sepsis due to undetermined organism ([place]), ruled out - Upon presentation, she had mild tachypnea and significant leukocytosis and significantly elevated lactic acid level.  However patient is afebrile and her procalcitonin is unremarkable as well.  Her presentation could very well be just because of the dehydration.  I do not think there is any evidence of infection or sepsis, sepsis ruled out.  I will watch her for now off of antibiotics, will discontinue antibiotics.   Frequent falls/generalized weakness PT OT consulted.   Nondisplaced fracture of shaft of fifth metacarpal bone, left hand, initial encounter for closed fracture - The patient had her left hand splinted.  Orthopedics consulted, waiting for evaluation.   GERD without esophagitis - We will continue PPI therapy.   Paroxysmal atrial fibrillation (HCC) - We will continue amiodarone. - The patient has IVC and high fall risk and therefore is not on any anticoagulations.   Hypothyroidism - We will continue Synthroid.   Myasthenia gravis ([place]) - We will continue her pyridostigmine.  Intramuscular and subcutaneous hematoma within the right gluteal region: 12.7 cm  in size.  Has edema but no erythema.  Hypokalemia: Will replace.  Acute hyponatremia: Presented with 124 sodium and now 127.  This is likely hypovolemic hyponatremia due to dehydration.  Dehydration/lactic acidosis: Lactic  acidosis resolved, she appears to be better hydrated now.  She is on 150 cc of RL, I will reduce that to 75 cc.  DVT prophylaxis: Will start on SCDs due to large gluteal hematoma.   Code Status: DNR  Family Communication: Sister present at bedside.  Plan of care discussed with patient in length and he/she verbalized understanding and agreed with it.  Status is: Inpatient Remains inpatient appropriate because: Still very weak, needs evaluation by PT OT, might need discharge to SNF.  Needs evaluation by orthopedics as well.   Estimated body mass index is 40.11 kg/m as calculated from the following:   Height as of this encounter: 5' 4"$  (1.626 m).   Weight as of this encounter: 106 kg.  Pressure Injury 10/23/19 Nose Stage 1 -  Intact skin with non-blanchable redness of a localized area usually over a bony prominence. (Active)  10/23/19 1110  Location: Nose  Location Orientation:   Staging: Stage 1 -  Intact skin with non-blanchable redness of a localized area usually over a bony prominence.  Wound Description (Comments):   Present on Admission:      Pressure Injury Buttocks Left Stage 2 -  Partial thickness loss of dermis presenting as a shallow open injury with a red, pink wound bed without slough. (Active)     Location: Buttocks  Location Orientation: Left  Staging: Stage 2 -  Partial thickness loss of dermis presenting as a shallow open injury with a red, pink wound bed without slough.  Wound Description (Comments):   Present on Admission: Yes (per pt sister from previous biopsy)     Pressure Injury 04/10/20 Buttocks Mid Stage 1 -  Intact skin with non-blanchable redness of a localized area usually over a bony prominence. (Active)  04/10/20 0937  Location: Buttocks  Location Orientation: Mid  Staging: Stage 1 -  Intact skin with non-blanchable redness of a localized area usually over a bony prominence.  Wound Description (Comments):   Present on Admission:    Nutritional  Assessment: Body mass index is 40.11 kg/m.[name] Seen by dietician.  I agree with the assessment and plan as outlined below: Nutrition Status:        . Skin Assessment: I have examined the patient's skin and I agree with the wound assessment as performed by the wound care RN as outlined below: Pressure Injury 10/23/19 Nose Stage 1 -  Intact skin with non-blanchable redness of a localized area usually over a bony prominence. (Active)  10/23/19 1110  Location: Nose  Location Orientation:   Staging: Stage 1 -  Intact skin with non-blanchable redness of a localized area usually over a bony prominence.  Wound Description (Comments):   Present on Admission:      Pressure Injury Buttocks Left Stage 2 -  Partial thickness loss of dermis presenting as a shallow open injury with a red, pink wound bed without slough. (Active)     Location: Buttocks  Location Orientation: Left  Staging: Stage 2 -  Partial thickness loss of dermis presenting as a shallow open injury with a red, pink wound bed without slough.  Wound Description (Comments):   Present on Admission: Yes (per pt sister from previous biopsy)     Pressure Injury 04/10/20 Buttocks Mid Stage 1 -  Intact skin with  non-blanchable redness of a localized area usually over a bony prominence. (Active)  04/10/20 0937  Location: Buttocks  Location Orientation: Mid  Staging: Stage 1 -  Intact skin with non-blanchable redness of a localized area usually over a bony prominence.  Wound Description (Comments):   Present on Admission:     Consultants:  Orthopedics  Procedures:  None  Antimicrobials:  Anti-infectives (From admission, onward)    Start     Dose/Rate Route Frequency Ordered Stop   04/02/22 1200  linezolid (ZYVOX) IVPB 600 mg        600 mg 300 mL/hr over 60 Minutes Intravenous Every 12 hours 04/02/22 0336     04/02/22 1200  ceFEPIme (MAXIPIME) 2 g in sodium chloride 0.9 % 100 mL IVPB        2 g 200 mL/hr over 30 Minutes  Intravenous Every 12 hours 04/02/22 0352     04/02/22 0800  ceFEPIme (MAXIPIME) 2 g in sodium chloride 0.9 % 100 mL IVPB  Status:  Discontinued        2 g 200 mL/hr over 30 Minutes Intravenous Every 8 hours 04/02/22 0335 04/02/22 0352   04/02/22 0345  metroNIDAZOLE (FLAGYL) IVPB 500 mg        500 mg 100 mL/hr over 60 Minutes Intravenous Every 12 hours 04/02/22 0335 04/09/22 0344   04/01/22 2345  linezolid (ZYVOX) IVPB 600 mg        600 mg 300 mL/hr over 60 Minutes Intravenous  Once 04/01/22 2338 04/02/22 0118   04/01/22 2330  linezolid (ZYVOX) IVPB 600 mg  Status:  Discontinued        600 mg 300 mL/hr over 60 Minutes Intravenous Every 12 hours 04/01/22 2324 04/01/22 2338   04/01/22 2330  ceFEPIme (MAXIPIME) 2 g in sodium chloride 0.9 % 100 mL IVPB        2 g 200 mL/hr over 30 Minutes Intravenous  Once 04/01/22 2325 04/02/22 0041         Subjective: Patient seen and examined.  Sister at the bedside.  Patient complains of generalized body ache, more so in the legs.  No other complaint otherwise.  Denies any chest pain or shortness of breath.  She is fully alert and oriented.  Objective: Vitals:   04/02/22 0800 04/02/22 0830 04/02/22 0900 04/02/22 0930  BP: 120/65 123/62 119/66 118/62  Pulse: 75 77 87 83  Resp: 16 14 (!) 22 (!) 23  Temp:      TempSrc:      SpO2: 100% 100% 100% 99%  Weight:      Height:        Intake/Output Summary (Last 24 hours) at 04/02/2022 1123 Last data filed at 04/02/2022 0324 [place] per 24 hour  Intake 100.08 ml  Output 700 ml  Net -599.92 ml   Filed Weights   04/01/22 2140  Weight: 106 kg    Examination:  General exam: Appears calm and comfortable  Respiratory system: Clear to auscultation. Respiratory effort normal. Cardiovascular system: S1 & S2 heard, RRR. No JVD, murmurs, rubs, gallops or clicks. No pedal edema. Gastrointestinal system: Abdomen is nondistended, soft and nontender. No organomegaly or masses felt. Normal bowel sounds  heard. Central nervous system: Alert and oriented. No focal neurological deficits. Extremities: Symmetric 5 x 5 power.  Has a splint in the left arm. Skin: No rashes, lesions or ulcers  Data Reviewed: I have personally reviewed following labs and imaging studies  CBC: Recent Labs  Lab 04/01/22 2210 04/02/22 0834  WBC 19.4* 9.5  NEUTROABS 16.4*  --   HGB 10.6* 7.8*  HCT 33.1* 23.7*  MCV 89.9 87.8  PLT 259 123XX123*   Basic Metabolic Panel: Recent Labs  Lab 04/01/22 2210 04/02/22 0610 04/02/22 0834  NA 124* 127*  --   K 3.2* 3.4*  --   CL 89* 100  --   CO2 20* 22  --   GLUCOSE 193* 132*  --   BUN 24* 18  --   CREATININE 1.23* 0.88  --   CALCIUM 8.2* 7.5*  --   MG 1.6*  --  1.6*   GFR: Estimated Creatinine Clearance: 70.6 mL/min (by C-G formula based on SCr of 0.88 mg/dL). Liver Function Tests: Recent Labs  Lab 04/01/22 2210  AST 35  ALT 19  ALKPHOS 55  BILITOT 0.6  PROT 5.4*  ALBUMIN 3.1*   No results for input(s): "LIPASE", "AMYLASE" in the last 168 hours. No results for input(s): "AMMONIA" in the last 168 hours. Coagulation Profile: Recent Labs  Lab 04/01/22 2340 04/02/22 0610  INR 1.0 1.1   Cardiac Enzymes: No results for input(s): "CKTOTAL", "CKMB", "CKMBINDEX", "TROPONINI" in the last 168 hours. BNP (last 3 results) No results for input(s): "PROBNP" in the last 8760 hours. HbA1C: No results for input(s): "HGBA1C" in the last 72 hours. CBG: No results for input(s): "GLUCAP" in the last 168 hours. Lipid Profile: No results for input(s): "CHOL", "HDL", "LDLCALC", "TRIG", "CHOLHDL", "LDLDIRECT" in the last 72 hours. Thyroid Function Tests: Recent Labs    04/01/22 2210  TSH 4.346  FREET4 1.29*   Anemia Panel: No results for input(s): "VITAMINB12", "FOLATE", "FERRITIN", "TIBC", "IRON", "RETICCTPCT" in the last 72 hours. Sepsis Labs: Recent Labs  Lab 04/01/22 2139 04/01/22 2210 04/02/22 0120 04/02/22 0834  PROCALCITON  --  <0.10  --   --    LATICACIDVEN 5.2*  --  2.3* 1.0    Recent Results (from the past 240 hour(s))  Blood Culture (routine x 2)     Status: None (Preliminary result)   Collection Time: 04/01/22 11:30 PM   Specimen: BLOOD  Result Value Ref Range Status   Specimen Description BLOOD  LEFT ARM  Final   Special Requests   Final    BOTTLES DRAWN AEROBIC AND ANAEROBIC Blood Culture results may not be optimal due to an inadequate volume of blood received in culture bottles   Culture   Final    NO GROWTH < 12 HOURS Performed at [facility], [address] [place], [place] [address]    Report Status PENDING  Incomplete  Blood Culture (routine x 2)     Status: None (Preliminary result)   Collection Time: 04/01/22 11:40 PM   Specimen: BLOOD  Result Value Ref Range Status   Specimen Description BLOOD  RIGHT ARM  Final   Special Requests   Final    BOTTLES DRAWN AEROBIC AND ANAEROBIC Blood Culture adequate volume   Culture   Final    NO GROWTH < 12 HOURS Performed at [facility], [address] [place], [place] [address]    Report Status PENDING  Incomplete  Resp panel by RT-PCR (RSV, Flu A&B, Covid) Anterior Nasal Swab     Status: None   Collection Time: 04/01/22 11:44 PM   Specimen: Anterior Nasal Swab  Result Value Ref Range Status   SARS Coronavirus 2 by RT PCR NEGATIVE NEGATIVE Final    Comment: (NOTE) SARS-CoV-2 target nucleic acids are NOT DETECTED.  The SARS-CoV-2 RNA  is generally detectable in upper respiratory specimens during the acute phase of infection. The lowest concentration of SARS-CoV-2 viral copies this assay can detect is 138 copies/mL. A negative result does not preclude SARS-Cov-2 infection and should not be used as the sole basis for treatment or other patient management decisions. A negative result may occur with  improper specimen collection/handling, submission of specimen other than nasopharyngeal swab, presence of viral mutation(s) within the areas  targeted by this assay, and inadequate number of viral copies(<138 copies/mL). A negative result must be combined with clinical observations, patient history, and epidemiological information. The expected result is Negative.  Fact Sheet for Patients:  EntrepreneurPulse.com.au  Fact Sheet for Healthcare Providers:  IncredibleEmployment.be  This test is no t yet approved or cleared by the [place] FDA and  has been authorized for detection and/or diagnosis of SARS-CoV-2 by FDA under an Emergency Use Authorization (EUA). This EUA will remain  in effect (meaning this test can be used) for the duration of the COVID-19 declaration under Section 564(b)(1) of the Act, 21 U.S.C.section 360bbb-3(b)(1), unless the authorization is terminated  or revoked sooner.       Influenza A by PCR NEGATIVE NEGATIVE Final   Influenza B by PCR NEGATIVE NEGATIVE Final    Comment: (NOTE) The Xpert Xpress SARS-CoV-2/FLU/RSV plus assay is intended as an aid in the diagnosis of influenza from Nasopharyngeal swab specimens and should not be used as a sole basis for treatment. Nasal washings and aspirates are unacceptable for Xpert Xpress SARS-CoV-2/FLU/RSV testing.  Fact Sheet for Patients: EntrepreneurPulse.com.au  Fact Sheet for Healthcare Providers: IncredibleEmployment.be  This test is not yet approved or cleared by the [place] FDA and has been authorized for detection and/or diagnosis of SARS-CoV-2 by FDA under an Emergency Use Authorization (EUA). This EUA will remain in effect (meaning this test can be used) for the duration of the COVID-19 declaration under Section 564(b)(1) of the Act, 21 U.S.C. section 360bbb-3(b)(1), unless the authorization is terminated or revoked.     Resp Syncytial Virus by PCR NEGATIVE NEGATIVE Final    Comment: (NOTE) Fact Sheet for  Patients: EntrepreneurPulse.com.au  Fact Sheet for Healthcare Providers: IncredibleEmployment.be  This test is not yet approved or cleared by the [place] FDA and has been authorized for detection and/or diagnosis of SARS-CoV-2 by FDA under an Emergency Use Authorization (EUA). This EUA will remain in effect (meaning this test can be used) for the duration of the COVID-19 declaration under Section 564(b)(1) of the Act, 21 U.S.C. section 360bbb-3(b)(1), unless the authorization is terminated or revoked.  Performed at [facility], [address] [place], [place] [address]      Radiology Studies: CT CHEST ABDOMEN PELVIS W CONTRAST  Result Date: 04/02/2022 CLINICAL DATA:  Sepsis. History of treated thymoma and renal cell carcinoma. EXAM: CT CHEST, ABDOMEN, AND PELVIS WITH CONTRAST TECHNIQUE: Multidetector CT imaging of the chest, abdomen and pelvis was performed following the standard protocol during bolus administration of intravenous contrast. RADIATION DOSE REDUCTION: This exam was performed according to the departmental dose-optimization program which includes automated exposure control, adjustment of the mA and/or kV according to patient size and/or use of iterative reconstruction technique. CONTRAST:  14m OMNIPAQUE IOHEXOL 300 MG/ML  SOLN COMPARISON:  CT chest 04/29/2021, CT abdomen pelvis 06/15/2019, MRI abdomen 05/02/2021 FINDINGS: CT CHEST FINDINGS Cardiovascular: No significant vascular findings. Normal heart size. No pericardial effusion. Mediastinum/Nodes: Visualized thyroid unremarkable. Stable dystrophic calcification and soft tissue within the anterior mediastinum likely representing post  treatment change related to reported thymoma. The esophagus is gas filled suggesting changes of gastroesophageal reflux or esophageal dysmotility. No pathologic adenopathy within the thorax. Lungs/Pleura: Stable cicatricial changes within the  right paramediastinal upper lobe in keeping with post radiation fibrosis. Mild right basilar fibrotic change. Mosaic attenuation within the lungs bilaterally is in keeping with multifocal air trapping related to small airways disease. No confluent pulmonary infiltrate. No pneumothorax or pleural effusion. No central obstructing lesion. Musculoskeletal: Chronic subluxation of the sternoclavicular articulations bilaterally, unchanged. No acute bone abnormality. No lytic or blastic bone lesion. CT ABDOMEN PELVIS FINDINGS Hepatobiliary: No focal liver abnormality is seen. Status post cholecystectomy. No biliary dilatation. Pancreas: Unremarkable. No pancreatic ductal dilatation or surrounding inflammatory changes. Spleen: Unremarkable Adrenals/Urinary Tract: Nodular thickening of the adrenal glands bilaterally is stable since prior examination in keeping with nodular hyperplasia. The kidneys are normal in size and position. Post ablated changes are seen within the posterior lower pole of the left kidney, stable since prior MRI examination. No enhancing intra cortical mass. No hydronephrosis. No intrarenal or ureteral calculi. The bladder is unremarkable. Stomach/Bowel: Moderate sigmoid diverticulosis. Stomach, small bowel, and large bowel are otherwise unremarkable. No evidence of obstruction or focal inflammation. No free intraperitoneal gas or fluid. Appendix absent. Vascular/Lymphatic: Aortic atherosclerosis. No enlarged abdominal or pelvic lymph nodes. Reproductive: Status post hysterectomy. No adnexal masses. Other: No abdominal wall hernia. There is hyperdense soft tissue infiltrating within the musculature of the gluteus medius and extending laterally into the subcutaneous soft tissues of the right gluteal region in keeping with a intramuscular and subcutaneous hematoma measuring at least 6.0 x 10.3 x 12.7 cm (volume = 410 cm^3). There is extensive overlying subcutaneous edema. Musculoskeletal: No acute bone  abnormality. Degenerative changes are seen within the lumbar spine. IMPRESSION: 1. 12.7 cm intramuscular and subcutaneous hematoma within the right gluteal region. Extensive overlying subcutaneous edema. 2. Stable post treatment change within the anterior mediastinum. 3. Stable post ablated changes within the lower pole of the left kidney. 4. Moderate sigmoid diverticulosis without superimposed acute inflammatory change. Aortic Atherosclerosis (ICD10-I70.0). Electronically Signed   By: Fidela Salisbury M.D.   On: 04/02/2022 01:59   DG Chest 1 View  Result Date: 04/01/2022 CLINICAL DATA:  Fall chest pain EXAM: CHEST  1 VIEW COMPARISON:  11/04/2019, 04/08/2020, chest CT 04/29/2021 FINDINGS: No acute airspace disease or effusion. Normal cardiomediastinal silhouette. No pneumothorax. Mild right parahilar distortion is stable and presumably related to post treatment change. IMPRESSION: No active disease. Electronically Signed   By: Donavan Foil M.D.   On: 04/01/2022 23:10   DG Hips Bilat W or Wo Pelvis 2 Views  Result Date: 04/01/2022 CLINICAL DATA:  Fall EXAM: DG HIP (WITH OR WITHOUT PELVIS) 2V BILAT COMPARISON:  None Available. FINDINGS: Bones are osteopenic. There is no evidence of hip fracture or dislocation. There is no evidence of arthropathy or other focal bone abnormality. There surgical clips in the right lower quadrant. IMPRESSION: No acute fracture or dislocation. Osteopenia. Electronically Signed   By: Ronney Asters M.D.   On: 04/01/2022 23:08   DG Hand Complete Left  Result Date: 04/01/2022 CLINICAL DATA:  Fall EXAM: LEFT HAND - COMPLETE 3+ VIEW COMPARISON:  02/27/2021 FINDINGS: Chronic deformity at the tufts of the third and fourth distal phalanges. Acute to subacute nondisplaced fracture involving the proximal shaft of the fifth metacarpal. No subluxation. Mild degenerative change at the first [facility] joint IMPRESSION: Acute to subacute nondisplaced fracture involving the proximal shaft of the fifth  metacarpal. Electronically Signed   By: Donavan Foil M.D.   On: 04/01/2022 23:08   CT Head Wo Contrast  Result Date: 04/01/2022 CLINICAL DATA:  Head trauma, minor (Age >= 65y); Neck trauma (Age >= 65y). Myasthenia gravis, multiple falls EXAM: CT HEAD WITHOUT CONTRAST CT CERVICAL SPINE WITHOUT CONTRAST TECHNIQUE: Multidetector CT imaging of the head and cervical spine was performed following the standard protocol without intravenous contrast. Multiplanar CT image reconstructions of the cervical spine were also generated. RADIATION DOSE REDUCTION: This exam was performed according to the departmental dose-optimization program which includes automated exposure control, adjustment of the mA and/or kV according to patient size and/or use of iterative reconstruction technique. COMPARISON:  02/27/2021 FINDINGS: CT HEAD FINDINGS Brain: Normal anatomic configuration. Parenchymal volume loss is commensurate with the patient's age. Mild periventricular white matter changes are present likely reflecting the sequela of small vessel ischemia. No abnormal intra or extra-axial mass lesion or fluid collection. No abnormal mass effect or midline shift. No evidence of acute intracranial hemorrhage or infarct. Ventricular size is normal. Cerebellum unremarkable. Vascular: No asymmetric hyperdense vasculature at the skull base. Skull: Intact Sinuses/Orbits: Paranasal sinuses are clear. Orbits are unremarkable. Other: Mastoid air cells and middle ear cavities are clear. CT CERVICAL SPINE FINDINGS Alignment: Normal. Skull base and vertebrae: Craniocervical alignment is normal. The atlantodental interval is not widened. No acute fracture of the cervical spine. Vertebral body height is preserved. Soft tissues and spinal canal: No prevertebral fluid or swelling. No visible canal hematoma. Disc levels: There is intervertebral disc space narrowing and endplate remodeling at [id number] in keeping with changes of diffuse moderate to severe  degenerative disc disease. Prevertebral soft tissues are not thickened on sagittal reformats. No high-grade canal stenosis or neuroforaminal narrowing. Upper chest: Negative. Other: None IMPRESSION: 1. No acute intracranial abnormality. No calvarial fracture. 2. No acute fracture or listhesis of the cervical spine. Electronically Signed   By: Fidela Salisbury M.D.   On: 04/01/2022 22:34   CT Cervical Spine Wo Contrast  Result Date: 04/01/2022 CLINICAL DATA:  Head trauma, minor (Age >= 65y); Neck trauma (Age >= 65y). Myasthenia gravis, multiple falls EXAM: CT HEAD WITHOUT CONTRAST CT CERVICAL SPINE WITHOUT CONTRAST TECHNIQUE: Multidetector CT imaging of the head and cervical spine was performed following the standard protocol without intravenous contrast. Multiplanar CT image reconstructions of the cervical spine were also generated. RADIATION DOSE REDUCTION: This exam was performed according to the departmental dose-optimization program which includes automated exposure control, adjustment of the mA and/or kV according to patient size and/or use of iterative reconstruction technique. COMPARISON:  02/27/2021 FINDINGS: CT HEAD FINDINGS Brain: Normal anatomic configuration. Parenchymal volume loss is commensurate with the patient's age. Mild periventricular white matter changes are present likely reflecting the sequela of small vessel ischemia. No abnormal intra or extra-axial mass lesion or fluid collection. No abnormal mass effect or midline shift. No evidence of acute intracranial hemorrhage or infarct. Ventricular size is normal. Cerebellum unremarkable. Vascular: No asymmetric hyperdense vasculature at the skull base. Skull: Intact Sinuses/Orbits: Paranasal sinuses are clear. Orbits are unremarkable. Other: Mastoid air cells and middle ear cavities are clear. CT CERVICAL SPINE FINDINGS Alignment: Normal. Skull base and vertebrae: Craniocervical alignment is normal. The atlantodental interval is not widened. No  acute fracture of the cervical spine. Vertebral body height is preserved. Soft tissues and spinal canal: No prevertebral fluid or swelling. No visible canal hematoma. Disc levels: There is intervertebral disc space narrowing and endplate remodeling at QA348G in keeping with changes  of diffuse moderate to severe degenerative disc disease. Prevertebral soft tissues are not thickened on sagittal reformats. No high-grade canal stenosis or neuroforaminal narrowing. Upper chest: Negative. Other: None IMPRESSION: 1. No acute intracranial abnormality. No calvarial fracture. 2. No acute fracture or listhesis of the cervical spine. Electronically Signed   By: Fidela Salisbury M.D.   On: 04/01/2022 22:34    Scheduled Meds:  acidophilus  1 capsule Oral BID   amiodarone  200 mg Oral Daily   vitamin C  1,000 mg Oral QHS   B-complex with vitamin C  1 tablet Oral QPM   calcium-vitamin D   Oral Daily   desmopressin  10 mcg Nasal BID   diltiazem  180 mg Oral Daily   DULoxetine  30 mg Oral Daily   enoxaparin (LOVENOX) injection  0.5 mg/kg Subcutaneous Q24H   fesoterodine  4 mg Oral Daily   folic acid  1 mg Oral Daily   levothyroxine  75 mcg Oral Q0600   pantoprazole  40 mg Oral Daily   potassium chloride  40 mEq Oral Q4H   pyridostigmine  60 mg Oral TID   risperiDONE  1 mg Oral QHS   vitamin E  400 Units Oral Q1400   Continuous Infusions:  ceFEPime (MAXIPIME) IV     lactated ringers 150 mL/hr at 04/02/22 0718   linezolid (ZYVOX) IV     metronidazole Stopped (04/02/22 0606)     LOS: 0 days   Darliss Cheney, MD [facility]  04/02/2022, 11:23 AM   *Please note that this is a verbal dictation therefore any spelling or grammatical errors are due to the "[place] [employer]" system interpretation.  Please page via [place] and do not message via secure chat for urgent patient care matters. Secure chat can be used for non urgent patient care matters.  How to contact the [facility] Attending or Consulting provider  [place] or covering provider during after hours [place], for this patient?  Check the care team in [facility] and look for a) attending/consulting [facility] provider listed and b) the [facility] team listed. Page or secure chat [phone]. Log into [email or web address] and use [place]'s universal password to access. If you do not have the password, please contact the hospital operator. Locate the [facility] provider you are looking for under [facility] and page to a number that you can be directly reached. If you still have difficulty reaching the provider, please page the [facility] (Director on Call) for the Hospitalists listed on amion for assistance.

## 2022-04-02 NOTE — Progress Notes (Signed)
CODE SEPSIS - PHARMACY COMMUNICATION  **Broad Spectrum Antibiotics should be administered within 1 hour of Sepsis diagnosis**  Time Code Sepsis Called/Page Received: 2/13 @ 2320  Antibiotics Ordered: cefepime, Zyvox   Time of 1st antibiotic administration: Cefepime 2 gm IV X 1 on 2/14 @ 0010  Additional action taken by pharmacy:   If necessary, Name of Provider/Nurse Contacted:     Mayerli Kirst D ,PharmD Clinical Pharmacist  04/02/2022  12:43 AM

## 2022-04-02 NOTE — Assessment & Plan Note (Signed)
-   This could be related to generalized weakness and possibly #1. - PT consult will be obtained.

## 2022-04-02 NOTE — Assessment & Plan Note (Deleted)
-   Orthopedic consult to be obtained. - Pain management will be provided. - The patient had her left hand splinted.

## 2022-04-02 NOTE — Consult Note (Signed)
ORTHOPAEDIC CONSULTATION  REQUESTING PHYSICIAN: Darliss Cheney, MD  Chief Complaint: Left hand pain  HPI: Madison MCKEARNEY is a 72 y.o. female who was admitted due to history of recent falls, dehydration, possible sepsis.  X-rays of the left hand were obtained in the ER which showed presence of a acute to subacute nondisplaced fracture of the fifth metacarpal shaft of the left hand.  Patient was placed into an ulnar gutter splint.  Orthopedics was consulted regarding further management.  Patient states that she had a fall yesterday when she injured the hand.  Patient's family is present at bedside.  Past Medical History:  Diagnosis Date   Cancer of kidney ([place]) [id number]   Complication of anesthesia    Myasthenia gravis   GERD (gastroesophageal reflux disease)    History of seizure disorder    Hypercholesterolemia    Hypertension    no longer has it. wt loss   Hypothyroidism    Irritable bowel syndrome    Melanoma ([place]) 2017   Melanoma on neck   Myasthenia gravis ([place]) 2011   OSA on CPAP    Thymoma, malignant ([place])    Past Surgical History:  Procedure Laterality Date   [place]  2013   COLONOSCOPY WITH PROPOFOL N/A 06/30/2017   Procedure: COLONOSCOPY WITH PROPOFOL;  Surgeon: Toledo, Benay Pike, MD;  Location: [facility] ENDOSCOPY;  Service: Gastroenterology;  Laterality: N/A;   CYSTOSCOPY W/ URETERAL STENT PLACEMENT Left 08/17/2019   Procedure: CYSTOSCOPY WITH RETROGRADE PYELOGRAM/URETERAL STENT PLACEMENT;  Surgeon: Alexis Frock, MD;  Location: [facility] ORS;  Service: Urology;  Laterality: Left;  30 MINS   ESOPHAGOGASTRODUODENOSCOPY (EGD) WITH PROPOFOL N/A 06/30/2017   Procedure: ESOPHAGOGASTRODUODENOSCOPY (EGD) WITH PROPOFOL;  Surgeon: Toledo, Benay Pike, MD;  Location: [facility] ENDOSCOPY;  Service: Gastroenterology;  Laterality: N/A;   IR RADIOLOGIST EVAL & MGMT  06/28/2019   IR RADIOLOGIST EVAL & MGMT  09/14/2019   IR RADIOLOGIST EVAL &  MGMT  03/28/2020   IR RADIOLOGIST EVAL & MGMT  10/10/2020   IR RADIOLOGIST EVAL & MGMT  11/22/2021   RADIOFREQUENCY ABLATION Left 08/17/2019   Procedure: LEFT RENAL CRYO ABLATION;  Surgeon: Jacqulynn Cadet, MD;  Location: [facility] ORS;  Service: Anesthesiology;  Laterality: Left;   TONSILLECTOMY  1959   Social History   Socioeconomic History   Marital status: Single    Spouse name: Not on file   Number of children: 0   Years of education: Special Ed   Highest education level: Not on file  Occupational History   Occupation: Unemployed  Tobacco Use   Smoking status: Never   Smokeless tobacco: Never  Vaping Use   Vaping Use: Never used  Substance and Sexual Activity   Alcohol use: No    Alcohol/week: 0.0 standard drinks of alcohol   Drug use: No   Sexual activity: Not Currently  Other Topics Concern   Not on file  Social History Narrative   06/23/17 lives with sister, [name]   Regular exercise-no   Caffeine Use-yes   Social Determinants of Health   Financial Resource Strain: Not on file  Food Insecurity: Not on file  Transportation Needs: Not on file  Physical Activity: Not on file  Stress: Not on file  Social Connections: Not on file   Family History  Problem Relation Age of Onset   Colon cancer Maternal Grandfather        stomach/colon   Heart disease Father  myocardial infarction   Hyperlipidemia Father    Hyperlipidemia Sister    Diabetes Sister    Bone cancer Other        cousin   Alzheimer's disease Mother        maternal aunts   Skin cancer Mother    Myasthenia gravis Brother        ocular   Skin cancer Brother    Stroke Paternal Grandfather    Breast cancer Neg Hx    No Known Allergies Prior to Admission medications   Medication Sig Start Date End Date Taking? Authorizing Provider  acetaminophen (TYLENOL) 325 MG tablet Take 2 tablets (650 mg total) by mouth every 6 (six) hours as needed for mild pain (or Fever >/= 101). 04/13/20  Yes Cherene Altes,  MD  amiodarone (PACERONE) 200 MG tablet Take 1 tablet (200 mg total) by mouth daily. 04/14/20  Yes Cherene Altes, MD  Ascorbic Acid (VITAMIN C) 1000 MG tablet Take 1,000 mg by mouth at bedtime.   Yes [provider]  B Complex-C (B-COMPLEX WITH VITAMIN C) tablet Take 1 tablet by mouth every evening.   Yes [provider]  benzonatate (TESSALON) 100 MG capsule TAKE 1 CAPSULE BY MOUTH TWICE DAILY AS NEEDED FOR COUGH 04/01/22  Yes Einar Pheasant, MD  Biotin 10000 MCG TABS Take 10,000 mcg by mouth daily at 2 PM. (1430)   Yes [provider]  Calcium-Phosphorus-Vitamin D (CITRACAL +D3 PO) Take 1 tablet by mouth daily.   Yes [provider]  desmopressin (DDAVP NASAL) 0.01 % solution Place 1 spray (10 mcg total) into the nose 2 (two) times daily. 09/04/21  Yes Dohmeier, Asencion Partridge, MD  diltiazem (CARDIZEM CD) 180 MG 24 hr capsule Take 180 mg by mouth daily. 12/04/21  Yes [provider]  DULoxetine (CYMBALTA) 30 MG capsule TAKE 1 CAPSULE BY MOUTH ONCE DAILY 04/01/22  Yes Earlie Server, MD  folic acid (FOLVITE) 1 MG tablet TAKE 2 TABLETS BY MOUTH ONCE DAILY 01/14/22  Yes Lomax, Amy, NP  Garlic 123XX123 MG CAPS Take 1,000 mg by mouth in the morning, at noon, and at bedtime. (0800, 1430, 2000)   Yes [provider]  levothyroxine (SYNTHROID) 75 MCG tablet TAKE 1 TABLET BY MOUTH ONCE DAILY ON AN EMPTY STOMACH. WAIT 30 MINUTES BEFORE TAKING OTHER MEDS. 11/28/21  Yes Einar Pheasant, MD  methotrexate (RHEUMATREX) 2.5 MG tablet TAKE 4 TABLETS BY MOUTH ONCE A WEEK WITHFOLIC ACID 123456  Yes Dohmeier, Asencion Partridge, MD  pantoprazole (PROTONIX) 40 MG tablet Take 40 mg by mouth daily.  11/24/17  Yes [provider]  Probiotic Product (PROBIOTIC MULTI-ENZYME PO) Take 1 capsule by mouth daily. Probiotic Multi-Enzyme Digestive Formula   Yes [provider]  pyridostigmine (MESTINON) 60 MG tablet TAKE 1 TABLET BY MOUTH 3 TIMES DAILY 01/23/22  Yes Dohmeier, Asencion Partridge, MD   risperiDONE (RISPERDAL) 1 MG tablet Take 1 mg by mouth at bedtime.  07/08/18  Yes [provider]  tolterodine (DETROL LA) 4 MG 24 hr capsule TAKE 1 CAPSULE BY MOUTH ONCE EVERY MORNING 12/20/21  Yes Einar Pheasant, MD  vitamin E 180 MG (400 UNITS) capsule Take 400 Units by mouth daily at 2 PM. 1430   Yes [provider]   CT CHEST ABDOMEN PELVIS W CONTRAST  Result Date: 04/02/2022 CLINICAL DATA:  Sepsis. History of treated thymoma and renal cell carcinoma. EXAM: CT CHEST, ABDOMEN, AND PELVIS WITH CONTRAST TECHNIQUE: Multidetector CT imaging of the chest, abdomen and pelvis was  performed following the standard protocol during bolus administration of intravenous contrast. RADIATION DOSE REDUCTION: This exam was performed according to the departmental dose-optimization program which includes automated exposure control, adjustment of the mA and/or kV according to patient size and/or use of iterative reconstruction technique. CONTRAST:  119m OMNIPAQUE IOHEXOL 300 MG/ML  SOLN COMPARISON:  CT chest 04/29/2021, CT abdomen pelvis 06/15/2019, MRI abdomen 05/02/2021 FINDINGS: CT CHEST FINDINGS Cardiovascular: No significant vascular findings. Normal heart size. No pericardial effusion. Mediastinum/Nodes: Visualized thyroid unremarkable. Stable dystrophic calcification and soft tissue within the anterior mediastinum likely representing post treatment change related to reported thymoma. The esophagus is gas filled suggesting changes of gastroesophageal reflux or esophageal dysmotility. No pathologic adenopathy within the thorax. Lungs/Pleura: Stable cicatricial changes within the right paramediastinal upper lobe in keeping with post radiation fibrosis. Mild right basilar fibrotic change. Mosaic attenuation within the lungs bilaterally is in keeping with multifocal air trapping related to small airways disease. No confluent pulmonary infiltrate. No pneumothorax or pleural effusion. No central obstructing  lesion. Musculoskeletal: Chronic subluxation of the sternoclavicular articulations bilaterally, unchanged. No acute bone abnormality. No lytic or blastic bone lesion. CT ABDOMEN PELVIS FINDINGS Hepatobiliary: No focal liver abnormality is seen. Status post cholecystectomy. No biliary dilatation. Pancreas: Unremarkable. No pancreatic ductal dilatation or surrounding inflammatory changes. Spleen: Unremarkable Adrenals/Urinary Tract: Nodular thickening of the adrenal glands bilaterally is stable since prior examination in keeping with nodular hyperplasia. The kidneys are normal in size and position. Post ablated changes are seen within the posterior lower pole of the left kidney, stable since prior MRI examination. No enhancing intra cortical mass. No hydronephrosis. No intrarenal or ureteral calculi. The bladder is unremarkable. Stomach/Bowel: Moderate sigmoid diverticulosis. Stomach, small bowel, and large bowel are otherwise unremarkable. No evidence of obstruction or focal inflammation. No free intraperitoneal gas or fluid. Appendix absent. Vascular/Lymphatic: Aortic atherosclerosis. No enlarged abdominal or pelvic lymph nodes. Reproductive: Status post hysterectomy. No adnexal masses. Other: No abdominal wall hernia. There is hyperdense soft tissue infiltrating within the musculature of the gluteus medius and extending laterally into the subcutaneous soft tissues of the right gluteal region in keeping with a intramuscular and subcutaneous hematoma measuring at least 6.0 x 10.3 x 12.7 cm (volume = 410 cm^3). There is extensive overlying subcutaneous edema. Musculoskeletal: No acute bone abnormality. Degenerative changes are seen within the lumbar spine. IMPRESSION: 1. 12.7 cm intramuscular and subcutaneous hematoma within the right gluteal region. Extensive overlying subcutaneous edema. 2. Stable post treatment change within the anterior mediastinum. 3. Stable post ablated changes within the lower pole of the left  kidney. 4. Moderate sigmoid diverticulosis without superimposed acute inflammatory change. Aortic Atherosclerosis (ICD10-I70.0). Electronically Signed   By: AFidela SalisburyM.D.   On: 04/02/2022 01:59   DG Chest 1 View  Result Date: 04/01/2022 CLINICAL DATA:  Fall chest pain EXAM: CHEST  1 VIEW COMPARISON:  11/04/2019, 04/08/2020, chest CT 04/29/2021 FINDINGS: No acute airspace disease or effusion. Normal cardiomediastinal silhouette. No pneumothorax. Mild right parahilar distortion is stable and presumably related to post treatment change. IMPRESSION: No active disease. Electronically Signed   By: KDonavan FoilM.D.   On: 04/01/2022 23:10   DG Hips Bilat W or Wo Pelvis 2 Views  Result Date: 04/01/2022 CLINICAL DATA:  Fall EXAM: DG HIP (WITH OR WITHOUT PELVIS) 2V BILAT COMPARISON:  None Available. FINDINGS: Bones are osteopenic. There is no evidence of hip fracture or dislocation. There is no evidence of arthropathy or other focal bone abnormality. There surgical clips in the  right lower quadrant. IMPRESSION: No acute fracture or dislocation. Osteopenia. Electronically Signed   By: Ronney Asters M.D.   On: 04/01/2022 23:08   DG Hand Complete Left  Result Date: 04/01/2022 CLINICAL DATA:  Fall EXAM: LEFT HAND - COMPLETE 3+ VIEW COMPARISON:  02/27/2021 FINDINGS: Chronic deformity at the tufts of the third and fourth distal phalanges. Acute to subacute nondisplaced fracture involving the proximal shaft of the fifth metacarpal. No subluxation. Mild degenerative change at the first [facility] joint IMPRESSION: Acute to subacute nondisplaced fracture involving the proximal shaft of the fifth metacarpal. Electronically Signed   By: Donavan Foil M.D.   On: 04/01/2022 23:08   CT Head Wo Contrast  Result Date: 04/01/2022 CLINICAL DATA:  Head trauma, minor (Age >= 65y); Neck trauma (Age >= 65y). Myasthenia gravis, multiple falls EXAM: CT HEAD WITHOUT CONTRAST CT CERVICAL SPINE WITHOUT CONTRAST TECHNIQUE: Multidetector  CT imaging of the head and cervical spine was performed following the standard protocol without intravenous contrast. Multiplanar CT image reconstructions of the cervical spine were also generated. RADIATION DOSE REDUCTION: This exam was performed according to the departmental dose-optimization program which includes automated exposure control, adjustment of the mA and/or kV according to patient size and/or use of iterative reconstruction technique. COMPARISON:  02/27/2021 FINDINGS: CT HEAD FINDINGS Brain: Normal anatomic configuration. Parenchymal volume loss is commensurate with the patient's age. Mild periventricular white matter changes are present likely reflecting the sequela of small vessel ischemia. No abnormal intra or extra-axial mass lesion or fluid collection. No abnormal mass effect or midline shift. No evidence of acute intracranial hemorrhage or infarct. Ventricular size is normal. Cerebellum unremarkable. Vascular: No asymmetric hyperdense vasculature at the skull base. Skull: Intact Sinuses/Orbits: Paranasal sinuses are clear. Orbits are unremarkable. Other: Mastoid air cells and middle ear cavities are clear. CT CERVICAL SPINE FINDINGS Alignment: Normal. Skull base and vertebrae: Craniocervical alignment is normal. The atlantodental interval is not widened. No acute fracture of the cervical spine. Vertebral body height is preserved. Soft tissues and spinal canal: No prevertebral fluid or swelling. No visible canal hematoma. Disc levels: There is intervertebral disc space narrowing and endplate remodeling at QA348G in keeping with changes of diffuse moderate to severe degenerative disc disease. Prevertebral soft tissues are not thickened on sagittal reformats. No high-grade canal stenosis or neuroforaminal narrowing. Upper chest: Negative. Other: None IMPRESSION: 1. No acute intracranial abnormality. No calvarial fracture. 2. No acute fracture or listhesis of the cervical spine. Electronically Signed    By: Fidela Salisbury M.D.   On: 04/01/2022 22:34   CT Cervical Spine Wo Contrast  Result Date: 04/01/2022 CLINICAL DATA:  Head trauma, minor (Age >= 65y); Neck trauma (Age >= 65y). Myasthenia gravis, multiple falls EXAM: CT HEAD WITHOUT CONTRAST CT CERVICAL SPINE WITHOUT CONTRAST TECHNIQUE: Multidetector CT imaging of the head and cervical spine was performed following the standard protocol without intravenous contrast. Multiplanar CT image reconstructions of the cervical spine were also generated. RADIATION DOSE REDUCTION: This exam was performed according to the departmental dose-optimization program which includes automated exposure control, adjustment of the mA and/or kV according to patient size and/or use of iterative reconstruction technique. COMPARISON:  02/27/2021 FINDINGS: CT HEAD FINDINGS Brain: Normal anatomic configuration. Parenchymal volume loss is commensurate with the patient's age. Mild periventricular white matter changes are present likely reflecting the sequela of small vessel ischemia. No abnormal intra or extra-axial mass lesion or fluid collection. No abnormal mass effect or midline shift. No evidence of acute intracranial hemorrhage or infarct. Ventricular  size is normal. Cerebellum unremarkable. Vascular: No asymmetric hyperdense vasculature at the skull base. Skull: Intact Sinuses/Orbits: Paranasal sinuses are clear. Orbits are unremarkable. Other: Mastoid air cells and middle ear cavities are clear. CT CERVICAL SPINE FINDINGS Alignment: Normal. Skull base and vertebrae: Craniocervical alignment is normal. The atlantodental interval is not widened. No acute fracture of the cervical spine. Vertebral body height is preserved. Soft tissues and spinal canal: No prevertebral fluid or swelling. No visible canal hematoma. Disc levels: There is intervertebral disc space narrowing and endplate remodeling at QA348G in keeping with changes of diffuse moderate to severe degenerative disc disease.  Prevertebral soft tissues are not thickened on sagittal reformats. No high-grade canal stenosis or neuroforaminal narrowing. Upper chest: Negative. Other: None IMPRESSION: 1. No acute intracranial abnormality. No calvarial fracture. 2. No acute fracture or listhesis of the cervical spine. Electronically Signed   By: Fidela Salisbury M.D.   On: 04/01/2022 22:34    Positive ROS: All other systems have been reviewed and were otherwise negative with the exception of those mentioned in the HPI and as above.  Physical Exam: General: Alert, no acute distress Cardiovascular: No pedal edema Respiratory: No cyanosis, no use of accessory musculature GI: No organomegaly, abdomen is soft and non-tender Skin: No lesions in the area of chief complaint Neurologic: Sensation intact distally Psychiatric: Patient is competent for consent with normal mood and affect Lymphatic: No axillary or cervical lymphadenopathy  MUSCULOSKELETAL: Left wrist in an ulnar gutter splint.  Able to flex and extend the fingers appropriately.  Compartments soft. Good cap refill. Motor and sensory intact distally.  Assessment: Nondisplaced left fifth metacarpal shaft fracture  Plan: This is a nonoperative fracture.  Recommend continuing ulnar gutter splint for the next 2 weeks.  May follow-up at that time with Tessa Lerner PA at [facility] for x-rays and potential transition into a Velcro brace.    Renee Harder, MD    04/02/2022 3:39 PM

## 2022-04-02 NOTE — Progress Notes (Signed)
Pt's NIF -30cm with good effort

## 2022-04-02 NOTE — Progress Notes (Signed)
NIF best of 3 with good effort -18 Pt unable to comprehend FVC trial with assistance from family member.

## 2022-04-02 NOTE — Sepsis Progress Note (Signed)
Following per sepsis protocol   

## 2022-04-02 NOTE — Assessment & Plan Note (Signed)
-   We will continue her pyridostigmine.

## 2022-04-02 NOTE — Assessment & Plan Note (Signed)
-   We will continue Synthroid. 

## 2022-04-02 NOTE — Assessment & Plan Note (Addendum)
-   We will continue amiodarone. - The patient is obviously high fall risk and therefore is not on any anticoagulations.

## 2022-04-02 NOTE — ED Notes (Addendum)
Urine sample obtained through in and out catheter.  Approximately 715m expressed from bladder.

## 2022-04-02 NOTE — Assessment & Plan Note (Signed)
-   We will continue PPI therapy. 

## 2022-04-02 NOTE — Progress Notes (Signed)
Asked by EDP to evaluate patient for ICU admission. Patient presented with main complaint of out of hospital mechanical fall with minor abrasion and bruising to face. Imaging stable. Upon arrival to ED patient met criteria for sepsis and soft hemodynamics were IVF responsive. No etiology yet for suspected sepsis, only chronic symptoms at home per the patient's sister who is her caretaker. Patient does have a history of MG and initial NIF was low at -18, but there was a question as to whether or not the patient was able to follow given directions due to cognitive delay. ICU asked to evaluate.   Upon bedside assessment, patient is Alert and Oriented at baseline- non-toxic appearing. Vitals are stable, breath sounds are clear bilaterally and patient is having no difficulty speaking as usual or managing her normal respiratory secretions on room air. Sister confirms she has been taking her medications as prescribed. Bedside NIF is improving and ABG stable without any signs of respiratory acidosis.  Patient does not meet ICU admission criteria at this time. Please re-consult if needed in the future.        Madison Burns, AGACNP-BC Acute Care Nurse Practitioner [facility]    [phone] / [phone] Please see Amion for pager details.

## 2022-04-02 NOTE — Assessment & Plan Note (Signed)
-   This is manifested by mild tachypnea and significant leukocytosis.  She may meet severe sepsis criteria with significantly elevated lactic acid level.  The etiology is unclear at this time. - She will be admitted to a medical telemetry bed. - We will follow blood cultures. - We will continue antibiotic therapy with IV Zyvox and cefepime as well as Flagyl. - We will continue hydration with IV normal saline with added potassium chloride.

## 2022-04-02 NOTE — Assessment & Plan Note (Addendum)
-   Orthopedic consult will be obtained. Dr Sharlet Salina was notified. - Pain management will be provided. - The patient had her left hand splinted.

## 2022-04-03 DIAGNOSIS — A419 Sepsis, unspecified organism: Secondary | ICD-10-CM | POA: Diagnosis not present

## 2022-04-03 LAB — CBC WITH DIFFERENTIAL/PLATELET
Abs Immature Granulocytes: 0.03 10*3/uL (ref 0.00–0.07)
Basophils Absolute: 0 10*3/uL (ref 0.0–0.1)
Basophils Relative: 0 %
Eosinophils Absolute: 0.1 10*3/uL (ref 0.0–0.5)
Eosinophils Relative: 1 %
HCT: 19.1 % — ABNORMAL LOW (ref 36.0–46.0)
Hemoglobin: 6.3 g/dL — ABNORMAL LOW (ref 12.0–15.0)
Immature Granulocytes: 0 %
Lymphocytes Relative: 10 %
Lymphs Abs: 0.8 10*3/uL (ref 0.7–4.0)
MCH: 29.4 pg (ref 26.0–34.0)
MCHC: 33 g/dL (ref 30.0–36.0)
MCV: 89.3 fL (ref 80.0–100.0)
Monocytes Absolute: 0.7 10*3/uL (ref 0.1–1.0)
Monocytes Relative: 9 %
Neutro Abs: 6.1 10*3/uL (ref 1.7–7.7)
Neutrophils Relative %: 80 %
Platelets: 119 10*3/uL — ABNORMAL LOW (ref 150–400)
RBC: 2.14 MIL/uL — ABNORMAL LOW (ref 3.87–5.11)
RDW: 16.7 % — ABNORMAL HIGH (ref 11.5–15.5)
WBC: 7.7 10*3/uL (ref 4.0–10.5)
nRBC: 0 % (ref 0.0–0.2)

## 2022-04-03 LAB — BASIC METABOLIC PANEL
Anion gap: 5 (ref 5–15)
BUN: 13 mg/dL (ref 8–23)
CO2: 20 mmol/L — ABNORMAL LOW (ref 22–32)
Calcium: 7.5 mg/dL — ABNORMAL LOW (ref 8.9–10.3)
Chloride: 101 mmol/L (ref 98–111)
Creatinine, Ser: 0.94 mg/dL (ref 0.44–1.00)
GFR, Estimated: >60 mL/min (ref >60)
Glucose, Bld: 106 mg/dL — ABNORMAL HIGH (ref 70–99)
Potassium: 4.3 mmol/L (ref 3.5–5.1)
Sodium: 126 mmol/L — ABNORMAL LOW (ref 135–145)

## 2022-04-03 LAB — MAGNESIUM: Magnesium: 2 mg/dL (ref 1.7–2.4)

## 2022-04-03 LAB — HEMOGLOBIN AND HEMATOCRIT, BLOOD
HCT: 23.4 % — ABNORMAL LOW (ref 36.0–46.0)
Hemoglobin: 7.6 g/dL — ABNORMAL LOW (ref 12.0–15.0)

## 2022-04-03 LAB — OSMOLALITY: Osmolality: 260 mOsm/kg — ABNORMAL LOW (ref 275–295)

## 2022-04-03 LAB — URINE CULTURE: Culture: NO GROWTH

## 2022-04-03 LAB — PREPARE RBC (CROSSMATCH)

## 2022-04-03 LAB — SODIUM
Sodium: 123 mmol/L — ABNORMAL LOW (ref 135–145)
Sodium: 120 mmol/L — ABNORMAL LOW (ref 135–145)

## 2022-04-03 MED ORDER — SODIUM CHLORIDE 0.9% IV SOLUTION: Freq: Once | INTRAVENOUS | Status: DC

## 2022-04-03 MED ORDER — SODIUM CHLORIDE 1 G PO TABS: 1.0000 g | ORAL_TABLET | Freq: Two times a day (BID) | ORAL | Status: DC

## 2022-04-03 MED ORDER — SODIUM CHLORIDE 0.9 % IV SOLN: INTRAVENOUS | Status: DC

## 2022-04-03 MED ORDER — SODIUM CHLORIDE 1 G PO TABS
1.0000 g | ORAL_TABLET | Freq: Two times a day (BID) | ORAL | Status: DC
  Administered 2022-04-03 – 2022-04-10 (×15): 1 g via ORAL
  Filled 2022-04-03 (×15): qty 1

## 2022-04-03 NOTE — Evaluation (Signed)
Physical Therapy Evaluation Patient Details Name: Madison Burns MRN: [id number] DOB: [birth year] Today's Date: 04/03/2022  History of Present Illness  Madison Burns is a 72 y.o. Caucasian female with medical history significant for myasthenia gravis, GERD, hypertension, dyslipidemia, hypothyroidism, IBS, OSA on CPAP, who presented to the emergency room with a [name] of recurrent falls.  She had a couple falls with subsequent head injury and facial contusion mainly in the nose.  found to have fifth metacarpal fracture.  Clinical Impression  Patient seated edge of bed with OT upon arrival to room; alert and oriented to self, location and general situation.  Follows commands, pleasant and cooperative; increased time for processing and task initiation.  Denies pain.  L UE in gutter splint with ace wrap intact.  Bilat UE/LEs strength and ROM otherwise grossly symmetrical and WFL for basic transfers and mobility; no focal weakness appreciated.  Currently requiring min assist for bed mobility; mod assist +2 with bilat HHA for sit/stand, standing balance ,basic transfers and gait (20').  Demonstrates broad BOS with choppy stepping pattern; forward flexed posture with limited balance reactions, poor insight/safety awareness.  High risk for falls without +2 assist Would benefit from skilled PT to address above deficits and promote optimal return to PLOF.; recommend transition to STR upon discharge from acute hospitalization.      Recommendations for follow up therapy are one component of a multi-disciplinary discharge planning process, led by the attending physician.  Recommendations may be updated based on patient status, additional functional criteria and insurance authorization.  Follow Up Recommendations Skilled nursing-short term rehab (<3 hours/day) Can patient physically be transported by private vehicle: Yes    Assistance Recommended at Discharge Frequent or constant Supervision/Assistance   Patient can return home with the following  Two people to help with walking and/or transfers;Two people to help with bathing/dressing/bathroom    Equipment Recommendations    Recommendations for Other Services       Functional Status Assessment Patient has had a recent decline in their functional status and demonstrates the ability to make significant improvements in function in a reasonable and predictable amount of time.     Precautions / Restrictions Precautions Precautions: Fall Restrictions Weight Bearing Restrictions: No      Mobility  Bed Mobility Overal bed mobility: Needs Assistance Bed Mobility: Supine to Sit     Supine to sit: Min assist          Transfers Overall transfer level: Needs assistance Equipment used: 2 person hand held assist Transfers: Sit to/from Stand Sit to Stand: Mod assist, +2 physical assistance           General transfer comment: frequent cuing to minimize use of L UE    Ambulation/Gait Ambulation/Gait assistance: Mod assist, +2 physical assistance Gait Distance (Feet): 20 Feet Assistive device: 2 person hand held assist         General Gait Details: broad BOS with choppy stepping pattern; forward flexed posture with limited balance reactions, poor insight/safety awareness.  High risk for falls without +2 assist  Stairs            Wheelchair Mobility    Modified Rankin (Stroke Patients Only)       Balance Overall balance assessment: Needs assistance Sitting-balance support: No upper extremity supported Sitting balance-Leahy Scale: Good     Standing balance support: Bilateral upper extremity supported Standing balance-Leahy Scale: Poor  Pertinent Vitals/Pain Pain Assessment Pain Assessment: No/denies pain    Home Living Family/patient expects to be discharged to:: Private residence Living Arrangements: Other relatives (sister) Available Help at Discharge:  Family;Available PRN/intermittently Type of Home: House Home Access: Level entry       Home Layout: One level Home Equipment: None      Prior Function Prior Level of Function : Independent/Modified Independent             Mobility Comments: x2 falls last week       Hand Dominance   Dominant Hand: Right    Extremity/Trunk Assessment   Upper Extremity Assessment LUE Deficits / Details: L hand in ulnar gutter splint    Lower Extremity Assessment Lower Extremity Assessment: Generalized weakness (grossly 4-/5 throughout)       Communication   Communication: No difficulties  Cognition Arousal/Alertness: Awake/alert Behavior During Therapy: WFL for tasks assessed/performed Overall Cognitive Status: History of cognitive impairments - at baseline                                          General Comments      Exercises     Assessment/Plan    PT Assessment Patient needs continued PT services  PT Problem List Decreased strength;Decreased range of motion;Decreased activity tolerance;Decreased coordination;Decreased mobility;Decreased balance;Decreased cognition;Decreased knowledge of use of DME;Decreased safety awareness;Decreased knowledge of precautions       PT Treatment Interventions DME instruction;Gait training;Functional mobility training;Therapeutic activities;Therapeutic exercise;Cognitive remediation;Balance training    PT Goals (Current goals can be found in the Care Plan section)  Acute Rehab PT Goals Patient Stated Goal: to get stronger before return home PT Goal Formulation: With patient/family Time For Goal Achievement: 04/17/22 Potential to Achieve Goals: Fair    Frequency Min 2X/week     Co-evaluation   Reason for Co-Treatment: For patient/therapist safety;To address functional/ADL transfers PT goals addressed during session: Mobility/safety with mobility OT goals addressed during session: ADL's and self-care        AM-PAC PT "6 Clicks" Mobility  Outcome Measure Help needed turning from your back to your side while in a flat bed without using bedrails?: A Little Help needed moving from lying on your back to sitting on the side of a flat bed without using bedrails?: A Little Help needed moving to and from a bed to a chair (including a wheelchair)?: A Lot Help needed standing up from a chair using your arms (e.g., wheelchair or bedside chair)?: A Lot Help needed to walk in hospital room?: A Lot Help needed climbing 3-5 steps with a railing? : A Lot 6 Click Score: 14    End of Session   Activity Tolerance: Patient tolerated treatment well Patient left: with call bell/phone within reach;with chair alarm set;in chair Nurse Communication: Mobility status PT Visit Diagnosis: Difficulty in walking, not elsewhere classified (R26.2);Muscle weakness (generalized) (M62.81)    Time: [id number] PT Time Calculation (min) (ACUTE ONLY): 17 min   Charges:   PT Evaluation $PT Eval Moderate Complexity: 1 Mod         Petra Dumler H. Owens Shark, PT, DPT, NCS 04/03/22, 10:36 PM [phone]

## 2022-04-03 NOTE — Plan of Care (Signed)

## 2022-04-03 NOTE — Evaluation (Signed)
Occupational Therapy Treatment Patient Details Name: Madison Burns MRN: [id number] DOB: [birth year] Today's Date: 04/03/2022   History of present illness DELAINEE KISHEL is a 72 y.o. Caucasian female with medical history significant for myasthenia gravis, GERD, hypertension, dyslipidemia, hypothyroidism, IBS, OSA on CPAP, who presented to the emergency room with a [name] of recurrent falls.  She had a couple falls with subsequent head injury and facial contusion mainly in the nose.  found to have fifth metacarpal fracture.   OT comments  Ms Busler was seen for OT/PT co-evaluation this date. Prior to hospital admission, pt was IND with 2 recent falls, assist for IADLs. Pt lives with sister. Pt presents to acute OT demonstrating impaired ADL performance and functional mobility 2/2 decreased activity tolerance and functional strength/ROM/balance deficits. Pt currently requires MIN A exit bed, MAX A don B socks seated EOB. MOD A x2 + HHA sit<>stand and ~15 feet mobility. Pt attempts to reach out for LUE support on furniture, repeated cues to minimize Wbing and provided L elbow support. Pt would benefit from skilled OT to address noted impairments and functional limitations (see below for any additional details). Upon hospital discharge, recommend STR to maximize pt safety and return to PLOF.   Recommendations for follow up therapy are one component of a multi-disciplinary discharge planning process, led by the attending physician.  Recommendations may be updated based on patient status, additional functional criteria and insurance authorization.    Follow Up Recommendations  Skilled nursing-short term rehab (<3 hours/day)     Assistance Recommended at Discharge Frequent or constant Supervision/Assistance  Patient can return home with the following  A lot of help with walking and/or transfers;A lot of help with bathing/dressing/bathroom;Help with stairs or ramp for entrance   Equipment  Recommendations  Other (comment) (defer)    Recommendations for Other Services      Precautions / Restrictions Precautions Precautions: Fall Restrictions Weight Bearing Restrictions: No       Mobility Bed Mobility Overal bed mobility: Needs Assistance Bed Mobility: Supine to Sit     Supine to sit: Min assist          Transfers Overall transfer level: Needs assistance Equipment used: 2 person hand held assist Transfers: Sit to/from Stand Sit to Stand: Mod assist, +2 physical assistance                 Balance Overall balance assessment: Needs assistance Sitting-balance support: No upper extremity supported Sitting balance-Leahy Scale: Fair     Standing balance support: Bilateral upper extremity supported Standing balance-Leahy Scale: Poor                             ADL either performed or assessed with clinical judgement   ADL Overall ADL's : Needs assistance/impaired                                       General ADL Comments: MAX A don B socks seated EOB. MOD A x2 + HHA simulated toilet t/f    Extremity/Trunk Assessment Upper Extremity Assessment Upper Extremity Assessment: Generalized weakness;LUE deficits/detail LUE Deficits / Details: L hand in ulnar gutter splint   Lower Extremity Assessment Lower Extremity Assessment: Generalized weakness         Cognition Arousal/Alertness: Awake/alert Behavior During Therapy: WFL for tasks assessed/performed Overall Cognitive Status: History of cognitive impairments -  at baseline                                          Pertinent Vitals/ Pain       Pain Assessment Pain Assessment: No/denies pain  Home Living Family/patient expects to be discharged to:: Private residence Living Arrangements: Other relatives (sister) Available Help at Discharge: Family;Available PRN/intermittently Type of Home: House Home Access: Level entry     Home Layout: One  level               Home Equipment: None          Prior Functioning/Environment     IND mobility, assist for sponge baths from sister         Frequency  Min 2X/week        Progress Toward Goals  OT Goals(current goals can now be found in the care plan section)     Acute Rehab OT Goals Patient Stated Goal: to go to rehab OT Goal Formulation: With patient/family Time For Goal Achievement: 04/17/22 Potential to Achieve Goals: Good ADL Goals Pt Will Perform Grooming: with set-up;with supervision;standing Pt Will Perform Lower Body Dressing: with min assist;sitting/lateral leans Pt Will Transfer to Toilet: with min assist;ambulating;regular height toilet  Plan      Co-evaluation    PT/OT/SLP Co-Evaluation/Treatment: Yes Reason for Co-Treatment: For patient/therapist safety;To address functional/ADL transfers PT goals addressed during session: Mobility/safety with mobility OT goals addressed during session: ADL's and self-care      AM-PAC OT "6 Clicks" Daily Activity     Outcome Measure   Help from another person eating meals?: None Help from another person taking care of personal grooming?: A Little Help from another person toileting, which includes using toliet, bedpan, or urinal?: A Lot Help from another person bathing (including washing, rinsing, drying)?: A Lot Help from another person to put on and taking off regular upper body clothing?: A Little Help from another person to put on and taking off regular lower body clothing?: A Lot 6 Click Score: 16    End of Session    OT Visit Diagnosis: Other abnormalities of gait and mobility (R26.89);Muscle weakness (generalized) (M62.81)   Activity Tolerance Patient tolerated treatment well   Patient Left in chair;with call bell/phone within reach;with chair alarm set;with family/visitor present   Nurse Communication          Time: 1511-1540 OT Time Calculation (min): 29 min  Charges: OT General  Charges $OT Visit: 1 Visit OT Evaluation $OT Eval Moderate Complexity: 1 Mod OT Treatments $Self Care/Home Management : 8-22 mins  Dessie Coma, M.S. OTR/L  04/03/22, 4:26 PM  ascom [phone]

## 2022-04-03 NOTE — Progress Notes (Signed)
PROGRESS NOTE    Madison Burns  [id number] DOB: July 23, 1950 DOA: 04/01/2022 PCP: Einar Pheasant, MD   Brief Narrative:  Madison Burns is a 72 y.o. Caucasian female with medical history significant for myasthenia gravis, GERD, hypertension, dyslipidemia, hypothyroidism, IBS, OSA on CPAP, who presented to the emergency room with a [name] of recurrent falls.  She had a couple falls with subsequent head injury and facial contusion mainly in the nose.  She stated that she tripped and fell.  No presyncope or syncope.  No headache or dizziness or blurred vision.  No paresthesias or focal muscle weakness.  No weakness seizures.  She had subsequent left hand swelling and pain and was found to have fifth metacarpal fracture.  No dysuria, oliguria or hematuria, urgency or frequency or flank pain.   ED Course: When she came to the ER, BP was 98/52 and temperature 97.4 with otherwise normal vital signs.  BP later on was 113/65.  She did not require any oxygen.  ABG was unremarkable.  CMP was remarkable for hyponatremia and hypochloremia, hypokalemia and a BUN of 24 with creatinine 1.23, magnesium 1.6 and high sensitive troponin I was 8.  Lactic acid was 5.2 and later 2.3 and procalcitonin less than 0.1.  CBC showed leukocytosis 19.4 with neutrophilia, anemia that is new.  Coag profile was within normal.  TSH was 4.34 and free T41.29. EKG as reviewed by me : Normal sinus rhythm with a rate of 78 with suspected left atrial enlargement, LVH and prolonged QT interval. Imaging: Portable chest x-ray showed no acute cardiopulmonary disease.  It showed mild right perihilar distortion that is stable.  Left hand x-ray showed acute to subacute nondisplaced fracture involving the proximal shaft of the fifth metacarpal.  Abdominal and pelvic CT scan revealed the following: 1. 12.7 cm intramuscular and subcutaneous hematoma within the right gluteal region. Extensive overlying subcutaneous edema. 2. Stable post  treatment change within the anterior mediastinum. 3. Stable post ablated changes within the lower pole of the left kidney. 4. Moderate sigmoid diverticulosis without superimposed acute inflammatory change.  Assessment & Plan:   Principal Problem:   Sepsis due to undetermined organism [facility]) Active Problems:   Frequent falls   Nondisplaced fracture of shaft of fifth metacarpal bone, left hand, initial encounter for closed fracture   Myasthenia gravis ([place])   Hypothyroidism   Paroxysmal atrial fibrillation (HCC)   GERD without esophagitis  Sepsis due to undetermined organism ([place]), ruled out - Upon presentation, she had mild tachypnea and significant leukocytosis and significantly elevated lactic acid level.  However patient is afebrile and her procalcitonin is unremarkable as well.  Her presentation could very well be just because of the dehydration.  I do not think there is any evidence of infection or sepsis, sepsis ruled out.  Antibiotics discontinued on the morning of 04/02/2022.  Patient has remained stable, afebrile.  No leukocytosis.   Frequent falls/generalized weakness PT OT consulted.   Nondisplaced fracture of shaft of fifth metacarpal bone, left hand, initial encounter for closed fracture - The patient had her left hand splinted.  Orthopedics consulted, they recommended continuing current management and follow-up outpatient in 2 weeks.   GERD without esophagitis - We will continue PPI therapy.   Paroxysmal atrial fibrillation (HCC) - We will continue amiodarone. - The patient has IVC and high fall risk and therefore is not on any anticoagulations.   Hypothyroidism - We will continue Synthroid.   Myasthenia gravis ([place]) - We will continue her pyridostigmine.  Acute blood loss anemia: No history of melena or GI bleed.  Hemoglobin dropped to 6.3, likely due to large subcutaneous hematoma within the gluteal region.  Will transfuse 1 unit of PRBC, patient denies any black  stools, infection has not had any bowel movement in last 2 days, however I will also check FOBT to rule out GI bleed.  Intramuscular and subcutaneous hematoma within the right gluteal region: 12.7 cm in size.  Has edema but no erythema.  Hypokalemia: Resolved  Acute hyponatremia: Presented with 124 sodium and now 126.  This is likely hypovolemic hyponatremia due to dehydration.  I will resume normal saline at 75 cc/h and will also start patient on p.o. sodium chloride tablets.  Monitor sodium every 8 hours.  Dehydration/lactic acidosis: Lactic acidosis resolved, she appears to be better hydrated now.  She is on 150 cc of RL, I will reduce that to 75 cc.  DVT prophylaxis: Place and maintain sequential compression device Start: 04/02/22 1129Will start on SCDs due to large gluteal hematoma.   Code Status: DNR  Family Communication: Sister present at bedside.  Plan of care discussed with patient in length and he/she verbalized understanding and agreed with it.  Status is: Inpatient Remains inpatient appropriate because: Anemia requiring blood transfusion and still needs to be seen by PT OT.  Also has hyponatremia.   Estimated body mass index is 40.11 kg/m as calculated from the following:   Height as of this encounter: 5' 4"$  (1.626 m).   Weight as of this encounter: 106 kg.  Pressure Injury 10/23/19 Nose Stage 1 -  Intact skin with non-blanchable redness of a localized area usually over a bony prominence. (Active)  10/23/19 1110  Location: Nose  Location Orientation:   Staging: Stage 1 -  Intact skin with non-blanchable redness of a localized area usually over a bony prominence.  Wound Description (Comments):   Present on Admission:      Pressure Injury Buttocks Left Stage 2 -  Partial thickness loss of dermis presenting as a shallow open injury with a red, pink wound bed without slough. (Active)     Location: Buttocks  Location Orientation: Left  Staging: Stage 2 -  Partial thickness  loss of dermis presenting as a shallow open injury with a red, pink wound bed without slough.  Wound Description (Comments):   Present on Admission: Yes (per pt sister from previous biopsy)     Pressure Injury 04/10/20 Buttocks Mid Stage 1 -  Intact skin with non-blanchable redness of a localized area usually over a bony prominence. (Active)  04/10/20 0937  Location: Buttocks  Location Orientation: Mid  Staging: Stage 1 -  Intact skin with non-blanchable redness of a localized area usually over a bony prominence.  Wound Description (Comments):   Present on Admission:    Nutritional Assessment: Body mass index is 40.11 kg/m.[name] Seen by dietician.  I agree with the assessment and plan as outlined below: Nutrition Status:        . Skin Assessment: I have examined the patient's skin and I agree with the wound assessment as performed by the wound care RN as outlined below: Pressure Injury 10/23/19 Nose Stage 1 -  Intact skin with non-blanchable redness of a localized area usually over a bony prominence. (Active)  10/23/19 1110  Location: Nose  Location Orientation:   Staging: Stage 1 -  Intact skin with non-blanchable redness of a localized area usually over a bony prominence.  Wound Description (Comments):   Present on  Admission:      Pressure Injury Buttocks Left Stage 2 -  Partial thickness loss of dermis presenting as a shallow open injury with a red, pink wound bed without slough. (Active)     Location: Buttocks  Location Orientation: Left  Staging: Stage 2 -  Partial thickness loss of dermis presenting as a shallow open injury with a red, pink wound bed without slough.  Wound Description (Comments):   Present on Admission: Yes (per pt sister from previous biopsy)     Pressure Injury 04/10/20 Buttocks Mid Stage 1 -  Intact skin with non-blanchable redness of a localized area usually over a bony prominence. (Active)  04/10/20 0937  Location: Buttocks  Location Orientation: Mid   Staging: Stage 1 -  Intact skin with non-blanchable redness of a localized area usually over a bony prominence.  Wound Description (Comments):   Present on Admission:     Consultants:  Orthopedics  Procedures:  None  Antimicrobials:  Anti-infectives (From admission, onward)    Start     Dose/Rate Route Frequency Ordered Stop   04/02/22 1200  linezolid (ZYVOX) IVPB 600 mg  Status:  Discontinued        600 mg 300 mL/hr over 60 Minutes Intravenous Every 12 hours 04/02/22 0336 04/02/22 1125   04/02/22 1200  ceFEPIme (MAXIPIME) 2 g in sodium chloride 0.9 % 100 mL IVPB  Status:  Discontinued        2 g 200 mL/hr over 30 Minutes Intravenous Every 12 hours 04/02/22 0352 04/02/22 1125   04/02/22 0800  ceFEPIme (MAXIPIME) 2 g in sodium chloride 0.9 % 100 mL IVPB  Status:  Discontinued        2 g 200 mL/hr over 30 Minutes Intravenous Every 8 hours 04/02/22 0335 04/02/22 0352   04/02/22 0345  metroNIDAZOLE (FLAGYL) IVPB 500 mg  Status:  Discontinued        500 mg 100 mL/hr over 60 Minutes Intravenous Every 12 hours 04/02/22 0335 04/02/22 1125   04/01/22 2345  linezolid (ZYVOX) IVPB 600 mg        600 mg 300 mL/hr over 60 Minutes Intravenous  Once 04/01/22 2338 04/02/22 0118   04/01/22 2330  linezolid (ZYVOX) IVPB 600 mg  Status:  Discontinued        600 mg 300 mL/hr over 60 Minutes Intravenous Every 12 hours 04/01/22 2324 04/01/22 2338   04/01/22 2330  ceFEPIme (MAXIPIME) 2 g in sodium chloride 0.9 % 100 mL IVPB        2 g 200 mL/hr over 30 Minutes Intravenous  Once 04/01/22 2325 04/02/22 0041         Subjective: Patient seen and examined.  She says that she is feeling a lot better than yesterday.  She still has body ache but it is improving.  Objective: Vitals:   04/02/22 1959 04/03/22 0024 04/03/22 0454 04/03/22 0734  BP: (!) 116/55 (!) 114/48 (!) 127/50 (!) 110/50  Pulse: 86 88 89 87  Resp: 16 18 16 20  $ Temp: 98.7 F (37.1 C) 98.7 F (37.1 C) 98.6 F (37 C) 99.4 F (37.4  C)  TempSrc:      SpO2: 100% 98% 96% 97%  Weight:      Height:        Intake/Output Summary (Last 24 hours) at 04/03/2022 0819 Last data filed at 04/03/2022 0550 [place] per 24 hour  Intake 240 ml  Output 450 ml  Net -210 ml    [employer]  04/01/22 2140  Weight: 106 kg    Examination:  General exam: Appears calm and comfortable, contusion/bruise at the bridge of the nose.  It is slightly tender. Respiratory system: Clear to auscultation. Respiratory effort normal. Cardiovascular system: S1 & S2 heard, RRR. No JVD, murmurs, rubs, gallops or clicks. No pedal edema. Gastrointestinal system: Abdomen is nondistended, soft and nontender. No organomegaly or masses felt. Normal bowel sounds heard. Central nervous system: Alert and oriented. No focal neurological deficits. Extremities: Symmetric 5 x 5 power.  Patient has large bruise secondary to hematoma at the right gluteal region. Skin: No rashes, lesions or ulcers.   Data Reviewed: I have personally reviewed following labs and imaging studies  CBC: Recent Labs  Lab 04/01/22 2210 04/02/22 0834 04/03/22 0330  WBC 19.4* 9.5 7.7  NEUTROABS 16.4*  --  6.1  HGB 10.6* 7.8* 6.3*  HCT 33.1* 23.7* 19.1*  MCV 89.9 87.8 89.3  PLT 259 122* 119*    Basic Metabolic Panel: Recent Labs  Lab 04/01/22 2210 04/02/22 0610 04/02/22 0834 04/03/22 0330  NA 124* 127*  --  126*  K 3.2* 3.4*  --  4.3  CL 89* 100  --  101  CO2 20* 22  --  20*  GLUCOSE 193* 132*  --  106*  BUN 24* 18  --  13  CREATININE 1.23* 0.88  --  0.94  CALCIUM 8.2* 7.5*  --  7.5*  MG 1.6*  --  1.6*  --     GFR: Estimated Creatinine Clearance: 66.1 mL/min (by C-G formula based on SCr of 0.94 mg/dL). Liver Function Tests: Recent Labs  Lab 04/01/22 2210  AST 35  ALT 19  ALKPHOS 55  BILITOT 0.6  PROT 5.4*  ALBUMIN 3.1*    No results for input(s): "LIPASE", "AMYLASE" in the last 168 hours. No results for input(s): "AMMONIA" in the last 168  hours. Coagulation Profile: Recent Labs  Lab 04/01/22 2340 04/02/22 0610  INR 1.0 1.1    Cardiac Enzymes: No results for input(s): "CKTOTAL", "CKMB", "CKMBINDEX", "TROPONINI" in the last 168 hours. BNP (last 3 results) No results for input(s): "PROBNP" in the last 8760 hours. HbA1C: No results for input(s): "HGBA1C" in the last 72 hours. CBG: No results for input(s): "GLUCAP" in the last 168 hours. Lipid Profile: No results for input(s): "CHOL", "HDL", "LDLCALC", "TRIG", "CHOLHDL", "LDLDIRECT" in the last 72 hours. Thyroid Function Tests: Recent Labs    04/01/22 2210  TSH 4.346  FREET4 1.29*    Anemia Panel: No results for input(s): "VITAMINB12", "FOLATE", "FERRITIN", "TIBC", "IRON", "RETICCTPCT" in the last 72 hours. Sepsis Labs: Recent Labs  Lab 04/01/22 2139 04/01/22 2210 04/02/22 0120 04/02/22 0610 04/02/22 0834  PROCALCITON  --  <0.10  --  <0.10  --   LATICACIDVEN 5.2*  --  2.3*  --  1.0     Recent Results (from the past 240 hour(s))  Blood Culture (routine x 2)     Status: None (Preliminary result)   Collection Time: 04/01/22 11:30 PM   Specimen: BLOOD  Result Value Ref Range Status   Specimen Description BLOOD  LEFT ARM  Final   Special Requests   Final    BOTTLES DRAWN AEROBIC AND ANAEROBIC Blood Culture results may not be optimal due to an inadequate volume of blood received in culture bottles   Culture   Final    NO GROWTH 2 DAYS Performed at [facility], [address], [place], [place] [address]    Report  Status PENDING  Incomplete  Blood Culture (routine x 2)     Status: None (Preliminary result)   Collection Time: 04/01/22 11:40 PM   Specimen: BLOOD  Result Value Ref Range Status   Specimen Description BLOOD  RIGHT ARM  Final   Special Requests   Final    BOTTLES DRAWN AEROBIC AND ANAEROBIC Blood Culture adequate volume   Culture   Final    NO GROWTH 2 DAYS Performed at [facility], [address], [place],  [place] [address]    Report Status PENDING  Incomplete  Resp panel by RT-PCR (RSV, Flu A&B, Covid) Anterior Nasal Swab     Status: None   Collection Time: 04/01/22 11:44 PM   Specimen: Anterior Nasal Swab  Result Value Ref Range Status   SARS Coronavirus 2 by RT PCR NEGATIVE NEGATIVE Final    Comment: (NOTE) SARS-CoV-2 target nucleic acids are NOT DETECTED.  The SARS-CoV-2 RNA is generally detectable in upper respiratory specimens during the acute phase of infection. The lowest concentration of SARS-CoV-2 viral copies this assay can detect is 138 copies/mL. A negative result does not preclude SARS-Cov-2 infection and should not be used as the sole basis for treatment or other patient management decisions. A negative result may occur with  improper specimen collection/handling, submission of specimen other than nasopharyngeal swab, presence of viral mutation(s) within the areas targeted by this assay, and inadequate number of viral copies(<138 copies/mL). A negative result must be combined with clinical observations, patient history, and epidemiological information. The expected result is Negative.  Fact Sheet for Patients:  EntrepreneurPulse.com.au  Fact Sheet for Healthcare Providers:  IncredibleEmployment.be  This test is no t yet approved or cleared by the [place] FDA and  has been authorized for detection and/or diagnosis of SARS-CoV-2 by FDA under an Emergency Use Authorization (EUA). This EUA will remain  in effect (meaning this test can be used) for the duration of the COVID-19 declaration under Section 564(b)(1) of the Act, 21 U.S.C.section 360bbb-3(b)(1), unless the authorization is terminated  or revoked sooner.       Influenza A by PCR NEGATIVE NEGATIVE Final   Influenza B by PCR NEGATIVE NEGATIVE Final    Comment: (NOTE) The Xpert Xpress SARS-CoV-2/FLU/RSV plus assay is intended as an aid in the diagnosis of influenza from  Nasopharyngeal swab specimens and should not be used as a sole basis for treatment. Nasal washings and aspirates are unacceptable for Xpert Xpress SARS-CoV-2/FLU/RSV testing.  Fact Sheet for Patients: EntrepreneurPulse.com.au  Fact Sheet for Healthcare Providers: IncredibleEmployment.be  This test is not yet approved or cleared by the [place] FDA and has been authorized for detection and/or diagnosis of SARS-CoV-2 by FDA under an Emergency Use Authorization (EUA). This EUA will remain in effect (meaning this test can be used) for the duration of the COVID-19 declaration under Section 564(b)(1) of the Act, 21 U.S.C. section 360bbb-3(b)(1), unless the authorization is terminated or revoked.     Resp Syncytial Virus by PCR NEGATIVE NEGATIVE Final    Comment: (NOTE) Fact Sheet for Patients: EntrepreneurPulse.com.au  Fact Sheet for Healthcare Providers: IncredibleEmployment.be  This test is not yet approved or cleared by the [place] FDA and has been authorized for detection and/or diagnosis of SARS-CoV-2 by FDA under an Emergency Use Authorization (EUA). This EUA will remain in effect (meaning this test can be used) for the duration of the COVID-19 declaration under Section 564(b)(1) of the Act, 21 U.S.C. section 360bbb-3(b)(1), unless the authorization is terminated  or revoked.  Performed at [facility], [address] [place], [place] [address]      Radiology Studies: CT CHEST ABDOMEN PELVIS W CONTRAST  Result Date: 04/02/2022 CLINICAL DATA:  Sepsis. History of treated thymoma and renal cell carcinoma. EXAM: CT CHEST, ABDOMEN, AND PELVIS WITH CONTRAST TECHNIQUE: Multidetector CT imaging of the chest, abdomen and pelvis was performed following the standard protocol during bolus administration of intravenous contrast. RADIATION DOSE REDUCTION: This exam was performed according to the  departmental dose-optimization program which includes automated exposure control, adjustment of the mA and/or kV according to patient size and/or use of iterative reconstruction technique. CONTRAST:  129m OMNIPAQUE IOHEXOL 300 MG/ML  SOLN COMPARISON:  CT chest 04/29/2021, CT abdomen pelvis 06/15/2019, MRI abdomen 05/02/2021 FINDINGS: CT CHEST FINDINGS Cardiovascular: No significant vascular findings. Normal heart size. No pericardial effusion. Mediastinum/Nodes: Visualized thyroid unremarkable. Stable dystrophic calcification and soft tissue within the anterior mediastinum likely representing post treatment change related to reported thymoma. The esophagus is gas filled suggesting changes of gastroesophageal reflux or esophageal dysmotility. No pathologic adenopathy within the thorax. Lungs/Pleura: Stable cicatricial changes within the right paramediastinal upper lobe in keeping with post radiation fibrosis. Mild right basilar fibrotic change. Mosaic attenuation within the lungs bilaterally is in keeping with multifocal air trapping related to small airways disease. No confluent pulmonary infiltrate. No pneumothorax or pleural effusion. No central obstructing lesion. Musculoskeletal: Chronic subluxation of the sternoclavicular articulations bilaterally, unchanged. No acute bone abnormality. No lytic or blastic bone lesion. CT ABDOMEN PELVIS FINDINGS Hepatobiliary: No focal liver abnormality is seen. Status post cholecystectomy. No biliary dilatation. Pancreas: Unremarkable. No pancreatic ductal dilatation or surrounding inflammatory changes. Spleen: Unremarkable Adrenals/Urinary Tract: Nodular thickening of the adrenal glands bilaterally is stable since prior examination in keeping with nodular hyperplasia. The kidneys are normal in size and position. Post ablated changes are seen within the posterior lower pole of the left kidney, stable since prior MRI examination. No enhancing intra cortical mass. No  hydronephrosis. No intrarenal or ureteral calculi. The bladder is unremarkable. Stomach/Bowel: Moderate sigmoid diverticulosis. Stomach, small bowel, and large bowel are otherwise unremarkable. No evidence of obstruction or focal inflammation. No free intraperitoneal gas or fluid. Appendix absent. Vascular/Lymphatic: Aortic atherosclerosis. No enlarged abdominal or pelvic lymph nodes. Reproductive: Status post hysterectomy. No adnexal masses. Other: No abdominal wall hernia. There is hyperdense soft tissue infiltrating within the musculature of the gluteus medius and extending laterally into the subcutaneous soft tissues of the right gluteal region in keeping with a intramuscular and subcutaneous hematoma measuring at least 6.0 x 10.3 x 12.7 cm (volume = 410 cm^3). There is extensive overlying subcutaneous edema. Musculoskeletal: No acute bone abnormality. Degenerative changes are seen within the lumbar spine. IMPRESSION: 1. 12.7 cm intramuscular and subcutaneous hematoma within the right gluteal region. Extensive overlying subcutaneous edema. 2. Stable post treatment change within the anterior mediastinum. 3. Stable post ablated changes within the lower pole of the left kidney. 4. Moderate sigmoid diverticulosis without superimposed acute inflammatory change. Aortic Atherosclerosis (ICD10-I70.0). Electronically Signed   By: AFidela SalisburyM.D.   On: 04/02/2022 01:59   DG Chest 1 View  Result Date: 04/01/2022 CLINICAL DATA:  Fall chest pain EXAM: CHEST  1 VIEW COMPARISON:  11/04/2019, 04/08/2020, chest CT 04/29/2021 FINDINGS: No acute airspace disease or effusion. Normal cardiomediastinal silhouette. No pneumothorax. Mild right parahilar distortion is stable and presumably related to post treatment change. IMPRESSION: No active disease. Electronically Signed   By: KDonavan FoilM.D.   On:  04/01/2022 23:10   DG Hips Bilat W or Wo Pelvis 2 Views  Result Date: 04/01/2022 CLINICAL DATA:  Fall EXAM: DG HIP (WITH  OR WITHOUT PELVIS) 2V BILAT COMPARISON:  None Available. FINDINGS: Bones are osteopenic. There is no evidence of hip fracture or dislocation. There is no evidence of arthropathy or other focal bone abnormality. There surgical clips in the right lower quadrant. IMPRESSION: No acute fracture or dislocation. Osteopenia. Electronically Signed   By: Ronney Asters M.D.   On: 04/01/2022 23:08   DG Hand Complete Left  Result Date: 04/01/2022 CLINICAL DATA:  Fall EXAM: LEFT HAND - COMPLETE 3+ VIEW COMPARISON:  02/27/2021 FINDINGS: Chronic deformity at the tufts of the third and fourth distal phalanges. Acute to subacute nondisplaced fracture involving the proximal shaft of the fifth metacarpal. No subluxation. Mild degenerative change at the first [facility] joint IMPRESSION: Acute to subacute nondisplaced fracture involving the proximal shaft of the fifth metacarpal. Electronically Signed   By: Donavan Foil M.D.   On: 04/01/2022 23:08   CT Head Wo Contrast  Result Date: 04/01/2022 CLINICAL DATA:  Head trauma, minor (Age >= 65y); Neck trauma (Age >= 65y). Myasthenia gravis, multiple falls EXAM: CT HEAD WITHOUT CONTRAST CT CERVICAL SPINE WITHOUT CONTRAST TECHNIQUE: Multidetector CT imaging of the head and cervical spine was performed following the standard protocol without intravenous contrast. Multiplanar CT image reconstructions of the cervical spine were also generated. RADIATION DOSE REDUCTION: This exam was performed according to the departmental dose-optimization program which includes automated exposure control, adjustment of the mA and/or kV according to patient size and/or use of iterative reconstruction technique. COMPARISON:  02/27/2021 FINDINGS: CT HEAD FINDINGS Brain: Normal anatomic configuration. Parenchymal volume loss is commensurate with the patient's age. Mild periventricular white matter changes are present likely reflecting the sequela of small vessel ischemia. No abnormal intra or extra-axial mass  lesion or fluid collection. No abnormal mass effect or midline shift. No evidence of acute intracranial hemorrhage or infarct. Ventricular size is normal. Cerebellum unremarkable. Vascular: No asymmetric hyperdense vasculature at the skull base. Skull: Intact Sinuses/Orbits: Paranasal sinuses are clear. Orbits are unremarkable. Other: Mastoid air cells and middle ear cavities are clear. CT CERVICAL SPINE FINDINGS Alignment: Normal. Skull base and vertebrae: Craniocervical alignment is normal. The atlantodental interval is not widened. No acute fracture of the cervical spine. Vertebral body height is preserved. Soft tissues and spinal canal: No prevertebral fluid or swelling. No visible canal hematoma. Disc levels: There is intervertebral disc space narrowing and endplate remodeling at [id number] in keeping with changes of diffuse moderate to severe degenerative disc disease. Prevertebral soft tissues are not thickened on sagittal reformats. No high-grade canal stenosis or neuroforaminal narrowing. Upper chest: Negative. Other: None IMPRESSION: 1. No acute intracranial abnormality. No calvarial fracture. 2. No acute fracture or listhesis of the cervical spine. Electronically Signed   By: Fidela Salisbury M.D.   On: 04/01/2022 22:34   CT Cervical Spine Wo Contrast  Result Date: 04/01/2022 CLINICAL DATA:  Head trauma, minor (Age >= 65y); Neck trauma (Age >= 65y). Myasthenia gravis, multiple falls EXAM: CT HEAD WITHOUT CONTRAST CT CERVICAL SPINE WITHOUT CONTRAST TECHNIQUE: Multidetector CT imaging of the head and cervical spine was performed following the standard protocol without intravenous contrast. Multiplanar CT image reconstructions of the cervical spine were also generated. RADIATION DOSE REDUCTION: This exam was performed according to the departmental dose-optimization program which includes automated exposure control, adjustment of the mA and/or kV according to patient size and/or use of iterative  reconstruction  technique. COMPARISON:  02/27/2021 FINDINGS: CT HEAD FINDINGS Brain: Normal anatomic configuration. Parenchymal volume loss is commensurate with the patient's age. Mild periventricular white matter changes are present likely reflecting the sequela of small vessel ischemia. No abnormal intra or extra-axial mass lesion or fluid collection. No abnormal mass effect or midline shift. No evidence of acute intracranial hemorrhage or infarct. Ventricular size is normal. Cerebellum unremarkable. Vascular: No asymmetric hyperdense vasculature at the skull base. Skull: Intact Sinuses/Orbits: Paranasal sinuses are clear. Orbits are unremarkable. Other: Mastoid air cells and middle ear cavities are clear. CT CERVICAL SPINE FINDINGS Alignment: Normal. Skull base and vertebrae: Craniocervical alignment is normal. The atlantodental interval is not widened. No acute fracture of the cervical spine. Vertebral body height is preserved. Soft tissues and spinal canal: No prevertebral fluid or swelling. No visible canal hematoma. Disc levels: There is intervertebral disc space narrowing and endplate remodeling at [id number] in keeping with changes of diffuse moderate to severe degenerative disc disease. Prevertebral soft tissues are not thickened on sagittal reformats. No high-grade canal stenosis or neuroforaminal narrowing. Upper chest: Negative. Other: None IMPRESSION: 1. No acute intracranial abnormality. No calvarial fracture. 2. No acute fracture or listhesis of the cervical spine. Electronically Signed   By: Fidela Salisbury M.D.   On: 04/01/2022 22:34    Scheduled Meds:  sodium chloride   Intravenous Once   acidophilus  1 capsule Oral BID   amiodarone  200 mg Oral Daily   vitamin C  1,000 mg Oral QHS   B-complex with vitamin C  1 tablet Oral QPM   calcium-vitamin D   Oral Daily   desmopressin  10 mcg Nasal BID   diltiazem  180 mg Oral Daily   DULoxetine  30 mg Oral Daily   enoxaparin (LOVENOX) injection  0.5 mg/kg  Subcutaneous Q24H   fesoterodine  4 mg Oral Daily   folic acid  1 mg Oral Daily   levothyroxine  75 mcg Oral Q0600   pantoprazole  40 mg Oral Daily   pyridostigmine  60 mg Oral TID   risperiDONE  1 mg Oral QHS   vitamin E  400 Units Oral Q1400   Continuous Infusions:     LOS: 1 day   Darliss Cheney, MD [facility]  04/03/2022, 8:19 AM   *Please note that this is a verbal dictation therefore any spelling or grammatical errors are due to the "[place] [facility]" system interpretation.  Please page via [place] and do not message via secure chat for urgent patient care matters. Secure chat can be used for non urgent patient care matters.  How to contact the [facility] Attending or Consulting provider [place] or covering provider during after hours [place], for this patient?  Check the care team in [facility] and look for a) attending/consulting TRH provider listed and b) the [facility] team listed. Page or secure chat [phone]. Log into [email or web address] and use [place] universal password to access. If you do not have the password, please contact the hospital operator. Locate the [facility] provider you are looking for under [facility] and page to a number that you can be directly reached. If you still have difficulty reaching the provider, please page the [facility] (Director on Call) for the Hospitalists listed on amion for assistance.

## 2022-04-03 NOTE — Progress Notes (Signed)
NIF - 34

## 2022-04-03 NOTE — Progress Notes (Signed)
NIF performed with -35 patients best recorded

## 2022-04-04 DIAGNOSIS — A419 Sepsis, unspecified organism: Secondary | ICD-10-CM | POA: Diagnosis not present

## 2022-04-04 LAB — CBC WITH DIFFERENTIAL/PLATELET
Abs Immature Granulocytes: 0.02 10*3/uL (ref 0.00–0.07)
Basophils Absolute: 0 10*3/uL (ref 0.0–0.1)
Basophils Relative: 0 %
Eosinophils Absolute: 0.1 10*3/uL (ref 0.0–0.5)
Eosinophils Relative: 2 %
HCT: 20.9 % — ABNORMAL LOW (ref 36.0–46.0)
Hemoglobin: 6.9 g/dL — ABNORMAL LOW (ref 12.0–15.0)
Immature Granulocytes: 0 %
Lymphocytes Relative: 15 %
Lymphs Abs: 1 10*3/uL (ref 0.7–4.0)
MCH: 29.4 pg (ref 26.0–34.0)
MCHC: 33 g/dL (ref 30.0–36.0)
MCV: 88.9 fL (ref 80.0–100.0)
Monocytes Absolute: 0.7 10*3/uL (ref 0.1–1.0)
Monocytes Relative: 10 %
Neutro Abs: 4.7 10*3/uL (ref 1.7–7.7)
Neutrophils Relative %: 73 %
Platelets: 122 10*3/uL — ABNORMAL LOW (ref 150–400)
RBC: 2.35 MIL/uL — ABNORMAL LOW (ref 3.87–5.11)
RDW: 15.9 % — ABNORMAL HIGH (ref 11.5–15.5)
WBC: 6.6 10*3/uL (ref 4.0–10.5)
nRBC: 0 % (ref 0.0–0.2)

## 2022-04-04 LAB — BASIC METABOLIC PANEL
Anion gap: 6 (ref 5–15)
BUN: 11 mg/dL (ref 8–23)
CO2: 20 mmol/L — ABNORMAL LOW (ref 22–32)
Calcium: 7 mg/dL — ABNORMAL LOW (ref 8.9–10.3)
Chloride: 92 mmol/L — ABNORMAL LOW (ref 98–111)
Creatinine, Ser: 0.68 mg/dL (ref 0.44–1.00)
GFR, Estimated: >60 mL/min (ref >60)
Glucose, Bld: 94 mg/dL (ref 70–99)
Potassium: 3.9 mmol/L (ref 3.5–5.1)
Sodium: 118 mmol/L — CL (ref 135–145)

## 2022-04-04 LAB — HEMOGLOBIN AND HEMATOCRIT, BLOOD
HCT: 25.2 % — ABNORMAL LOW (ref 36.0–46.0)
Hemoglobin: 8.7 g/dL — ABNORMAL LOW (ref 12.0–15.0)

## 2022-04-04 LAB — PREPARE RBC (CROSSMATCH)

## 2022-04-04 LAB — SODIUM
Sodium: 121 mmol/L — ABNORMAL LOW (ref 135–145)
Sodium: 119 mmol/L — CL (ref 135–145)
Sodium: 118 mmol/L — CL (ref 135–145)

## 2022-04-04 MED ORDER — SODIUM CHLORIDE 0.9% IV SOLUTION: Freq: Once | INTRAVENOUS | Status: AC

## 2022-04-04 NOTE — Care Management Important Message (Signed)
Important Message  Patient Details  Name: [place] MRN: [id number] Date of Birth: [birth year]   Medicare Important Message Given:  Yes     Juliann Pulse A Nikkole Placzek 04/04/2022, 11:25 AM

## 2022-04-04 NOTE — Progress Notes (Signed)
Mobility Specialist - Progress Note   04/04/22 1017  Mobility  Activity Contraindicated/medical hold     Per discussion with RN, pt preparing to receive blood transfusion. Will hold off and attempt mobility when medically appropriate.    Kathee Delton Mobility Specialist 04/04/22, 10:18 AM

## 2022-04-04 NOTE — Progress Notes (Signed)
NIF -35  

## 2022-04-04 NOTE — TOC Progression Note (Signed)
Transition of Care [facility]) - Progression Note    Patient Details  Name: [place] MRN: [id number] Date of Birth: Feb 26, 1950  Transition of Care [facility]) CM/SW Contact  Gerilyn Pilgrim, LCSW Phone Number: 04/04/2022, 12:12 PM  Clinical Narrative:   CSW spoke with patient and sister regarding SNF. Sister reports she is agreeable to SNF and would like the referral sent to the surrounding area, but not to [facility]. CSW has sent referrals and will follow up with pt and family once beds have been offered.         Expected Discharge Plan and Services                                               Social Determinants of Health (SDOH) Interventions SDOH Screenings   Depression (PHQ2-9): Low Risk  (11/11/2021)  Tobacco Use: Low Risk  (04/02/2022)    Readmission Risk Interventions    04/09/2020    1:25 PM  Readmission Risk Prevention Plan  Transportation Screening Complete  PCP or Specialist Appt within 3-5 Days Complete  HRI or [place] Complete  Social Work Consult for [place] Planning/Counseling Complete  Palliative Care Screening Not Applicable  Medication Review Press photographer) Complete

## 2022-04-04 NOTE — Progress Notes (Signed)
NIF -30 

## 2022-04-04 NOTE — NC FL2 (Signed)
[place] LEVEL OF CARE FORM     IDENTIFICATION  Patient Name: [place] Burns Birthdate: 04/05/1950 Sex: female Admission Date (Current Location): 04/01/2022  [place] and [place] Number:  Engineering geologist and Address:  [facility], [address] [place], [place] [address]      Provider Number: [id number]  Attending Physician Name and Address:  Darliss Cheney, MD  Relative Name and Phone Number:  Maryn, [place] (Sister) [phone]    Current Level of Care: Hospital Recommended Level of Care: [place] Prior Approval Number:    Date Approved/Denied:   PASRR Number: [id number]  Discharge Plan: SNF    Current Diagnoses: Patient Active Problem List   Diagnosis Date Noted   Sepsis due to undetermined organism ([place]) 04/02/2022   Paroxysmal atrial fibrillation ([place]) 04/02/2022   GERD without esophagitis 04/02/2022   Nondisplaced fracture of shaft of fifth metacarpal bone, left hand, initial encounter for closed fracture 04/02/2022   Sore throat 11/11/2021   COVID-19 virus infection 09/24/2021   Frequent falls 05/14/2021   Right leg swelling 05/14/2021   Healthcare maintenance 03/22/2021   Fall 03/16/2021   Sleeps in sitting position due to orthopnea 10/31/2020   Thrombocytopenia ([place]) 08/22/2020   Myasthenia gravis with (acute) exacerbation ([place]) 11/15/2019   Anemia due to chemotherapy for renal cell cancer treated with erythropoietin ([place]) 11/15/2019   Malignant thymoma ([place]) 11/15/2019   Idiopathic profound intellectual disability 11/15/2019   Dyspnea on exertion 11/15/2019   Acute respiratory failure with hypoxia ([place])    Palliative care by specialist    Acute respiratory distress    Pressure injury of skin 10/24/2019   Atrial fibrillation ([place]) 10/23/2019   Community acquired pneumonia 10/23/2019   Acute respiratory failure with hypoxia and hypercapnia ([place])    Hypothyroidism    Left renal mass 08/17/2019    Left shoulder pain 07/18/2019   Mediastinal mass 04/11/2019   Renal cell cancer, left ([place]) 03/09/2019   Thymoma 03/09/2019   Chest pain 12/05/2018   Fullness of breast 11/30/2018   History of seizure disorder 01/05/2018   Gastroesophagitis 07/02/2017   Cough 04/25/2017   Epilepsy, generalized, convulsive ([place]) 02/23/2017   OSA (obstructive sleep apnea) 02/23/2017   Mood disorder ([place]) 10/01/2016   Pain in right toe(s) 06/17/2015   MG (myasthenia gravis) ([place]) 06/06/2015   OSA on CPAP 06/06/2015   Dysuria 02/12/2015   Goals of care, counseling/discussion 06/12/2014   Obstructive sleep apnea syndrome 05/03/2014   MG with exacerbation (myasthenia gravis) ([place]) 04/10/2014   Hypersomnia with sleep apnea 04/10/2014   Obesity (BMI 30-39.9) 01/29/2014   Hyperglycemia 01/29/2014   Abdominal pain 01/29/2014   Melanoma ([place]) 01/23/2013   Hypercholesterolemia 12/21/2011   Hypertension 12/21/2011   Myasthenia gravis ([place]) 12/21/2011   Seizure disorder ([place]) 12/21/2011   Irritable bowel 12/21/2011    Orientation RESPIRATION BLADDER Height & Weight     Self, Time, Situation, Place  Normal Incontinent, External catheter Weight: 233 lb 11 oz (106 kg) Height:  5' 4"$  (162.6 cm)  BEHAVIORAL SYMPTOMS/MOOD NEUROLOGICAL BOWEL NUTRITION STATUS    Convulsions/Seizures Continent Diet (heart healthy)  AMBULATORY STATUS COMMUNICATION OF NEEDS Skin   Extensive Assist Verbally  (Ecchmyosis hematoma arm, face, hand, hip)                       Personal Care Assistance Level of Assistance  Bathing, Feeding, Dressing Bathing Assistance: Maximum assistance Feeding assistance: Maximum assistance Dressing Assistance: Maximum assistance  Functional Limitations Info  Sight, Speech, Hearing Sight Info: Adequate Hearing Info: Adequate Speech Info: Adequate    SPECIAL CARE FACTORS FREQUENCY  PT (By licensed PT), OT (By licensed OT)     PT Frequency: 5 times a week OT Frequency: 5 times a  week            Contractures Contractures Info: Not present    Additional Factors Info  Code Status Code Status Info: DNR             Current Medications (04/04/2022):  This is the current hospital active medication list Current Facility-Administered Medications  Medication Dose Route Frequency Provider Last Rate Last Admin   0.9 %  sodium chloride infusion (Manually program via Guardrails IV Fluids)   Intravenous Once Darliss Cheney, MD       acetaminophen (TYLENOL) tablet 650 mg  650 mg Oral Q6H PRN Mansy, Jan A, MD   650 mg at 04/03/22 1655   Or   acetaminophen (TYLENOL) suppository 650 mg  650 mg Rectal Q6H PRN Mansy, Jan A, MD       acidophilus (RISAQUAD) capsule 1 capsule  1 capsule Oral BID Mansy, Jan A, MD   1 capsule at 04/04/22 [id number]   amiodarone (PACERONE) tablet 200 mg  200 mg Oral Daily Mansy, Jan A, MD   200 mg at 04/04/22 [id number]   ascorbic acid (VITAMIN C) tablet 1,000 mg  1,000 mg Oral QHS Mansy, Jan A, MD   1,000 mg at 04/03/22 2134   B-complex with vitamin C tablet 1 tablet  1 tablet Oral QPM Mansy, Jan A, MD   1 tablet at 04/03/22 1655   benzonatate (TESSALON) capsule 100 mg  100 mg Oral BID PRN Mansy, Arvella Merles, MD       calcium-vitamin D (OSCAL WITH D) 500-5 MG-MCG per tablet   Oral Daily Mansy, Jan A, MD   1 tablet at 04/04/22 0953   desmopressin (DDAVP NASAL) 0.01 % solution 10 mcg  10 mcg Nasal BID Mansy, Jan A, MD   10 mcg at 04/04/22 0958   diltiazem (CARDIZEM CD) 24 hr capsule 180 mg  180 mg Oral Daily Mansy, Jan A, MD   180 mg at 04/04/22 [id number]   DULoxetine (CYMBALTA) DR capsule 30 mg  30 mg Oral Daily Mansy, Jan A, MD   30 mg at 04/04/22 0953   enoxaparin (LOVENOX) injection 52.5 mg  0.5 mg/kg Subcutaneous Q24H Mansy, Jan A, MD   52.5 mg at 04/04/22 0952   fesoterodine (TOVIAZ) tablet 4 mg  4 mg Oral Daily Mansy, Jan A, MD   4 mg at 0000000 0000000   folic acid (FOLVITE) tablet 1 mg  1 mg Oral Daily Mansy, Jan A, MD   1 mg at 04/04/22 0953   levothyroxine  (SYNTHROID) tablet 75 mcg  75 mcg Oral Q0600 Mansy, Jan A, MD   75 mcg at 04/04/22 0554   magnesium hydroxide (MILK OF MAGNESIA) suspension 30 mL  30 mL Oral Daily PRN Mansy, Jan A, MD       ondansetron [facility]) tablet 4 mg  4 mg Oral Q6H PRN Mansy, Jan A, MD       Or   ondansetron [facility]) injection 4 mg  4 mg Intravenous Q6H PRN Mansy, Jan A, MD       pantoprazole (PROTONIX) EC tablet 40 mg  40 mg Oral Daily Mansy, Jan A, MD   40 mg at 04/04/22 0951   pyridostigmine (MESTINON) tablet 60  mg  60 mg Oral TID Mansy, Jan A, MD   60 mg at 04/04/22 [id number]   risperiDONE (RISPERDAL) tablet 1 mg  1 mg Oral QHS Mansy, Jan A, MD   1 mg at 04/03/22 2134   sodium chloride tablet 1 g  1 g Oral BID WC Darliss Cheney, MD   1 g at 04/04/22 [id number]   traZODone (DESYREL) tablet 25 mg  25 mg Oral QHS PRN Mansy, Jan A, MD       vitamin E capsule 400 Units  400 Units Oral Q1400 Mansy, Arvella Merles, MD   400 Units at 04/03/22 1802     Discharge Medications: Please see discharge summary for a list of discharge medications.  Relevant Imaging Results:  Relevant Lab Results:   Additional Information SS# [id number]  Gerilyn Pilgrim, LCSW

## 2022-04-04 NOTE — Progress Notes (Signed)
PROGRESS NOTE    Madison Burns  [id number] DOB: 01-18-1951 DOA: 04/01/2022 PCP: Madison Pheasant, MD   Brief Narrative:  Madison Burns is a 72 y.o. Caucasian female with medical history significant for myasthenia gravis, GERD, hypertension, dyslipidemia, hypothyroidism, IBS, OSA on CPAP, who presented to the emergency room with a Madison Burns of recurrent falls.  She had a couple falls with subsequent head injury and facial contusion mainly in the nose.  She stated that she tripped and fell.  No presyncope or syncope.  No headache or dizziness or blurred vision.  No paresthesias or focal muscle weakness.  No weakness seizures.  She had subsequent left hand swelling and pain and was found to have fifth metacarpal fracture.  No dysuria, oliguria or hematuria, urgency or frequency or flank pain.   ED Course: When she came to the ER, BP was 98/52 and temperature 97.4 with otherwise normal vital signs.  BP later on was 113/65.  She did not require any oxygen.  ABG was unremarkable.  CMP was remarkable for hyponatremia and hypochloremia, hypokalemia and a BUN of 24 with creatinine 1.23, magnesium 1.6 and high sensitive troponin I was 8.  Lactic acid was 5.2 and later 2.3 and procalcitonin less than 0.1.  CBC showed leukocytosis 19.4 with neutrophilia, anemia that is new.  Coag profile was within normal.  TSH was 4.34 and free T41.29. EKG as reviewed by me : Normal sinus rhythm with a rate of 78 with suspected left atrial enlargement, LVH and prolonged QT interval. Imaging: Portable chest x-ray showed no acute cardiopulmonary disease.  It showed mild right perihilar distortion that is stable.  Left hand x-ray showed acute to subacute nondisplaced fracture involving the proximal shaft of the fifth metacarpal.  Abdominal and pelvic CT scan revealed the following: 1. 12.7 cm intramuscular and subcutaneous hematoma within the right gluteal region. Extensive overlying subcutaneous edema. 2. Stable post  treatment change within the anterior mediastinum. 3. Stable post ablated changes within the lower pole of the left kidney. 4. Moderate sigmoid diverticulosis without superimposed acute inflammatory change.  Assessment & Plan:   Principal Problem:   Sepsis due to undetermined organism [facility]) Active Problems:   Frequent falls   Nondisplaced fracture of shaft of fifth metacarpal bone, left hand, initial encounter for closed fracture   Myasthenia gravis ([place])   Hypothyroidism   Paroxysmal atrial fibrillation (HCC)   GERD without esophagitis  Sepsis due to undetermined organism ([place]), ruled out - Upon presentation, she had mild tachypnea and significant leukocytosis and significantly elevated lactic acid level.  However patient is afebrile and her procalcitonin is unremarkable as well.  Her presentation could very well be just because of the dehydration.  I do not think there is any evidence of infection or sepsis, sepsis ruled out.  Antibiotics discontinued on the morning of 04/02/2022.  Patient has remained stable, afebrile.  No leukocytosis.   Frequent falls/generalized weakness She is evaluated by PT and OT and they have recommended SNF.  Sister does not like the idea of breath she is okay if that is needed.   Nondisplaced fracture of shaft of fifth metacarpal bone, left hand, initial encounter for closed fracture - The patient had her left hand splinted.  Orthopedics consulted, they recommended continuing current management and follow-up outpatient in 2 weeks.   GERD without esophagitis - We will continue PPI therapy.   Paroxysmal atrial fibrillation (HCC) - We will continue amiodarone. - The patient has IVC and high fall risk and  therefore is not on any anticoagulations.   Hypothyroidism - We will continue Synthroid.   Myasthenia gravis ([place]) - We will continue her pyridostigmine.  Acute blood loss anemia: No history of melena or GI bleed.  Hemoglobin dropped to 6.3 on  04/03/2022, likely due to large subcutaneous hematoma within the gluteal region.  She was given 1 unit of PRBC transfusion.  Hemoglobin improved but dropped to 6.9 again today so I will transfuse her 1 unit again today.  I think it is secondary to hematoma as mentioned however FOBT was ordered from yesterday and is still pending.   Intramuscular and subcutaneous hematoma within the right gluteal region: 12.7 cm in size.  Has edema but no erythema.  Hypokalemia: Resolved  Acute hyponatremia: Presented with 124 which further dropped to 118.  This was when she was on normal saline.  Based on this, I highly suspect SIADH.  I had ordered urine sodium and urine osmolality yesterday which are still pending.  I stopped her IV fluids last night and sodium has started improved to 121 today.  I will continue sodium chloride tablets.  Monitor every 6 hours.  Follow labs.  Dehydration/lactic acidosis: Lactic acidosis resolved, she appears to be better hydrated now.  IV fluids discontinued last night.  DVT prophylaxis: Place and maintain sequential compression device Start: 04/02/22 1129on SCDs due to large gluteal hematoma.   Code Status: DNR  Family Communication: Sister present at bedside.  Plan of care discussed with patient in length and he/she verbalized understanding and agreed with it.  Status is: Inpatient Remains inpatient appropriate because: Anemia requiring blood transfusion/severe hyponatremia   Estimated body mass index is 40.11 kg/m as calculated from the following:   Height as of this encounter: 5' 4"$  (1.626 m).   Weight as of this encounter: 106 kg.  Pressure Injury 10/23/19 Nose Stage 1 -  Intact skin with non-blanchable redness of a localized area usually over a bony prominence. (Active)  10/23/19 1110  Location: Nose  Location Orientation:   Staging: Stage 1 -  Intact skin with non-blanchable redness of a localized area usually over a bony prominence.  Wound Description (Comments):    Present on Admission:      Pressure Injury Buttocks Left Stage 2 -  Partial thickness loss of dermis presenting as a shallow open injury with a red, pink wound bed without slough. (Active)     Location: Buttocks  Location Orientation: Left  Staging: Stage 2 -  Partial thickness loss of dermis presenting as a shallow open injury with a red, pink wound bed without slough.  Wound Description (Comments):   Present on Admission: Yes (per pt sister from previous biopsy)     Pressure Injury 04/10/20 Buttocks Mid Stage 1 -  Intact skin with non-blanchable redness of a localized area usually over a bony prominence. (Active)  04/10/20 0937  Location: Buttocks  Location Orientation: Mid  Staging: Stage 1 -  Intact skin with non-blanchable redness of a localized area usually over a bony prominence.  Wound Description (Comments):   Present on Admission:    Nutritional Assessment: Body mass index is 40.11 kg/m.[name] Seen by dietician.  I agree with the assessment and plan as outlined below: Nutrition Status:        . Skin Assessment: I have examined the patient's skin and I agree with the wound assessment as performed by the wound care RN as outlined below: Pressure Injury 10/23/19 Nose Stage 1 -  Intact skin with non-blanchable redness  of a localized area usually over a bony prominence. (Active)  10/23/19 1110  Location: Nose  Location Orientation:   Staging: Stage 1 -  Intact skin with non-blanchable redness of a localized area usually over a bony prominence.  Wound Description (Comments):   Present on Admission:      Pressure Injury Buttocks Left Stage 2 -  Partial thickness loss of dermis presenting as a shallow open injury with a red, pink wound bed without slough. (Active)     Location: Buttocks  Location Orientation: Left  Staging: Stage 2 -  Partial thickness loss of dermis presenting as a shallow open injury with a red, pink wound bed without slough.  Wound Description (Comments):    Present on Admission: Yes (per pt sister from previous biopsy)     Pressure Injury 04/10/20 Buttocks Mid Stage 1 -  Intact skin with non-blanchable redness of a localized area usually over a bony prominence. (Active)  04/10/20 0937  Location: Buttocks  Location Orientation: Mid  Staging: Stage 1 -  Intact skin with non-blanchable redness of a localized area usually over a bony prominence.  Wound Description (Comments):   Present on Admission:     Consultants:  Orthopedics  Procedures:  None  Antimicrobials:  Anti-infectives (From admission, onward)    Start     Dose/Rate Route Frequency Ordered Stop   04/02/22 1200  linezolid (ZYVOX) IVPB 600 mg  Status:  Discontinued        600 mg 300 mL/hr over 60 Minutes Intravenous Every 12 hours 04/02/22 0336 04/02/22 1125   04/02/22 1200  ceFEPIme (MAXIPIME) 2 g in sodium chloride 0.9 % 100 mL IVPB  Status:  Discontinued        2 g 200 mL/hr over 30 Minutes Intravenous Every 12 hours 04/02/22 0352 04/02/22 1125   04/02/22 0800  ceFEPIme (MAXIPIME) 2 g in sodium chloride 0.9 % 100 mL IVPB  Status:  Discontinued        2 g 200 mL/hr over 30 Minutes Intravenous Every 8 hours 04/02/22 0335 04/02/22 0352   04/02/22 0345  metroNIDAZOLE (FLAGYL) IVPB 500 mg  Status:  Discontinued        500 mg 100 mL/hr over 60 Minutes Intravenous Every 12 hours 04/02/22 0335 04/02/22 1125   04/01/22 2345  linezolid (ZYVOX) IVPB 600 mg        600 mg 300 mL/hr over 60 Minutes Intravenous  Once 04/01/22 2338 04/02/22 0118   04/01/22 2330  linezolid (ZYVOX) IVPB 600 mg  Status:  Discontinued        600 mg 300 mL/hr over 60 Minutes Intravenous Every 12 hours 04/01/22 2324 04/01/22 2338   04/01/22 2330  ceFEPIme (MAXIPIME) 2 g in sodium chloride 0.9 % 100 mL IVPB        2 g 200 mL/hr over 30 Minutes Intravenous  Once 04/01/22 2325 04/02/22 0041         Subjective:  Patient seen and examined.  Sister at the bedside.  Patient is feeling well.  She has no  complaints.  Objective: Vitals:   04/03/22 2046 04/04/22 0016 04/04/22 0437 04/04/22 0719  BP: (!) 112/59 (!) 101/56 (!) 104/57 (!) 107/59  Pulse: 72 77 75 76  Resp: 18 20 18 16  $ Temp: 98 F (36.7 C) 98.2 F (36.8 C) 98.1 F (36.7 C) 97.9 F (36.6 C)  TempSrc:      SpO2:  98% 96% 97%  Weight:      Height:  Intake/Output Summary (Last 24 hours) at 04/04/2022 1008 Last data filed at 04/04/2022 0600 [place] per 24 hour  Intake 955 ml  Output 400 ml  Net 555 ml    Filed Weights   04/01/22 2140  Weight: 106 kg    Examination:  General exam: Appears calm and comfortable  Respiratory system: Clear to auscultation. Respiratory effort normal. Cardiovascular system: S1 & S2 heard, RRR. No JVD, murmurs, rubs, gallops or clicks. No pedal edema. Gastrointestinal system: Abdomen is nondistended, soft and nontender. No organomegaly or masses felt. Normal bowel sounds heard. Central nervous system: Alert and oriented. No focal neurological deficits. Extremities: Symmetric 5 x 5 power. Skin: No rashes, lesions or ulcers.  Patient has large bruise secondary to hematoma at the right gluteal region.  Data Reviewed: I have personally reviewed following labs and imaging studies  CBC: Recent Labs  Lab 04/01/22 2210 04/02/22 0834 04/03/22 0330 04/03/22 1945 04/04/22 0357  WBC 19.4* 9.5 7.7  --  6.6  NEUTROABS 16.4*  --  6.1  --  4.7  HGB 10.6* 7.8* 6.3* 7.6* 6.9*  HCT 33.1* 23.7* 19.1* 23.4* 20.9*  MCV 89.9 87.8 89.3  --  88.9  PLT 259 122* 119*  --  122*    Basic Metabolic Panel: Recent Labs  Lab 04/01/22 2210 04/02/22 0610 04/02/22 0834 04/03/22 0330 04/03/22 0841 04/03/22 1308 04/03/22 1945 04/04/22 0357 04/04/22 0821  NA 124* 127*  --  126*  --  123* 120* 118* 121*  K 3.2* 3.4*  --  4.3  --   --   --  3.9  --   CL 89* 100  --  101  --   --   --  92*  --   CO2 20* 22  --  20*  --   --   --  20*  --   GLUCOSE 193* 132*  --  106*  --   --   --  94  --   BUN 24*  18  --  13  --   --   --  11  --   CREATININE 1.23* 0.88  --  0.94  --   --   --  0.68  --   CALCIUM 8.2* 7.5*  --  7.5*  --   --   --  7.0*  --   MG 1.6*  --  1.6*  --  2.0  --   --   --   --     GFR: Estimated Creatinine Clearance: 77.7 mL/min (by C-G formula based on SCr of 0.68 mg/dL). Liver Function Tests: Recent Labs  Lab 04/01/22 2210  AST 35  ALT 19  ALKPHOS 55  BILITOT 0.6  PROT 5.4*  ALBUMIN 3.1*    No results for input(s): "LIPASE", "AMYLASE" in the last 168 hours. No results for input(s): "AMMONIA" in the last 168 hours. Coagulation Profile: Recent Labs  Lab 04/01/22 2340 04/02/22 0610  INR 1.0 1.1    Cardiac Enzymes: No results for input(s): "CKTOTAL", "CKMB", "CKMBINDEX", "TROPONINI" in the last 168 hours. BNP (last 3 results) No results for input(s): "PROBNP" in the last 8760 hours. HbA1C: No results for input(s): "HGBA1C" in the last 72 hours. CBG: No results for input(s): "GLUCAP" in the last 168 hours. Lipid Profile: No results for input(s): "CHOL", "HDL", "LDLCALC", "TRIG", "CHOLHDL", "LDLDIRECT" in the last 72 hours. Thyroid Function Tests: Recent Labs    04/01/22 2210  TSH 4.346  FREET4 1.29*    Anemia  Panel: No results for input(s): "VITAMINB12", "FOLATE", "FERRITIN", "TIBC", "IRON", "RETICCTPCT" in the last 72 hours. Sepsis Labs: Recent Labs  Lab 04/01/22 2139 04/01/22 2210 04/02/22 0120 04/02/22 0610 04/02/22 0834  PROCALCITON  --  <0.10  --  <0.10  --   LATICACIDVEN 5.2*  --  2.3*  --  1.0     Recent Results (from the past 240 hour(s))  Blood Culture (routine x 2)     Status: None (Preliminary result)   Collection Time: 04/01/22 11:30 PM   Specimen: BLOOD  Result Value Ref Range Status   Specimen Description BLOOD  LEFT ARM  Final   Special Requests   Final    BOTTLES DRAWN AEROBIC AND ANAEROBIC Blood Culture results may not be optimal due to an inadequate volume of blood received in culture bottles   Culture   Final     NO GROWTH 3 DAYS Performed at [facility], [address], [place], [place] [address]    Report Status PENDING  Incomplete  Blood Culture (routine x 2)     Status: None (Preliminary result)   Collection Time: 04/01/22 11:40 PM   Specimen: BLOOD  Result Value Ref Range Status   Specimen Description BLOOD  RIGHT ARM  Final   Special Requests   Final    BOTTLES DRAWN AEROBIC AND ANAEROBIC Blood Culture adequate volume   Culture   Final    NO GROWTH 3 DAYS Performed at [facility], [address] [place], [place] [address]    Report Status PENDING  Incomplete  Resp panel by RT-PCR (RSV, Flu A&B, Covid) Anterior Nasal Swab     Status: None   Collection Time: 04/01/22 11:44 PM   Specimen: Anterior Nasal Swab  Result Value Ref Range Status   SARS Coronavirus 2 by RT PCR NEGATIVE NEGATIVE Final    Comment: (NOTE) SARS-CoV-2 target nucleic acids are NOT DETECTED.  The SARS-CoV-2 RNA is generally detectable in upper respiratory specimens during the acute phase of infection. The lowest concentration of SARS-CoV-2 viral copies this assay can detect is 138 copies/mL. A negative result does not preclude SARS-Cov-2 infection and should not be used as the sole basis for treatment or other patient management decisions. A negative result may occur with  improper specimen collection/handling, submission of specimen other than nasopharyngeal swab, presence of viral mutation(s) within the areas targeted by this assay, and inadequate number of viral copies(<138 copies/mL). A negative result must be combined with clinical observations, patient history, and epidemiological information. The expected result is Negative.  Fact Sheet for Patients:  EntrepreneurPulse.com.au  Fact Sheet for Healthcare Providers:  IncredibleEmployment.be  This test is no t yet approved or cleared by the [place] FDA and  has been authorized for detection  and/or diagnosis of SARS-CoV-2 by FDA under an Emergency Use Authorization (EUA). This EUA will remain  in effect (meaning this test can be used) for the duration of the COVID-19 declaration under Section 564(b)(1) of the Act, 21 U.S.C.section 360bbb-3(b)(1), unless the authorization is terminated  or revoked sooner.       Influenza A by PCR NEGATIVE NEGATIVE Final   Influenza B by PCR NEGATIVE NEGATIVE Final    Comment: (NOTE) The Xpert Xpress SARS-CoV-2/FLU/RSV plus assay is intended as an aid in the diagnosis of influenza from Nasopharyngeal swab specimens and should not be used as a sole basis for treatment. Nasal washings and aspirates are unacceptable for Xpert Xpress SARS-CoV-2/FLU/RSV testing.  Fact Sheet for Patients: EntrepreneurPulse.com.au  Fact Sheet for Healthcare Providers: IncredibleEmployment.be  This test is not yet approved or cleared by the [place] FDA and has been authorized for detection and/or diagnosis of SARS-CoV-2 by FDA under an Emergency Use Authorization (EUA). This EUA will remain in effect (meaning this test can be used) for the duration of the COVID-19 declaration under Section 564(b)(1) of the Act, 21 U.S.C. section 360bbb-3(b)(1), unless the authorization is terminated or revoked.     Resp Syncytial Virus by PCR NEGATIVE NEGATIVE Final    Comment: (NOTE) Fact Sheet for Patients: EntrepreneurPulse.com.au  Fact Sheet for Healthcare Providers: IncredibleEmployment.be  This test is not yet approved or cleared by the [place] FDA and has been authorized for detection and/or diagnosis of SARS-CoV-2 by FDA under an Emergency Use Authorization (EUA). This EUA will remain in effect (meaning this test can be used) for the duration of the COVID-19 declaration under Section 564(b)(1) of the Act, 21 U.S.C. section 360bbb-3(b)(1), unless the authorization is terminated  or revoked.  Performed at [facility], [address], [place], [place] [address]   Urine Culture (for pregnant, neutropenic or urologic patients or patients with an indwelling urinary catheter)     Status: None   Collection Time: 04/02/22  3:15 AM   Specimen: Urine, Clean Catch  Result Value Ref Range Status   Specimen Description   Final    URINE, CLEAN CATCH Performed at [facility], [address] [place], [place] [address]    Special Requests   Final    NONE Performed at [facility], [address] [place], [place] [address]    Culture   Final    NO GROWTH Performed at [facility], [place] [address] [place], [place] [address]    Report Status 04/03/2022 FINAL  Final     Radiology Studies: No results found.  Scheduled Meds:  sodium chloride   Intravenous Once   acidophilus  1 capsule Oral BID   amiodarone  200 mg Oral Daily   vitamin C  1,000 mg Oral QHS   B-complex with vitamin C  1 tablet Oral QPM   calcium-vitamin D   Oral Daily   desmopressin  10 mcg Nasal BID   diltiazem  180 mg Oral Daily   DULoxetine  30 mg Oral Daily   enoxaparin (LOVENOX) injection  0.5 mg/kg Subcutaneous Q24H   fesoterodine  4 mg Oral Daily   folic acid  1 mg Oral Daily   levothyroxine  75 mcg Oral Q0600   pantoprazole  40 mg Oral Daily   pyridostigmine  60 mg Oral TID   risperiDONE  1 mg Oral QHS   sodium chloride  1 g Oral BID WC   vitamin E  400 Units Oral Q1400   Continuous Infusions:     LOS: 2 days   Darliss Cheney, MD [facility]  04/04/2022, 10:08 AM   *Please note that this is a verbal dictation therefore any spelling or grammatical errors are due to the "Jenn[employer] One" system interpretation.  Please page via [place] and do not message via secure chat for urgent patient care matters. Secure chat can be used for non urgent patient care matters.  How to contact the [facility] Attending or Consulting provider [place] or covering  provider during after hours [place], for this patient?  Check the care team in [facility] and look for a) attending/consulting TR[facility] provider listed and b) the [facility] team listed. Page or secure  chat [phone]. Log into [email or web address] and use [place]'s universal password to access. If you do not have the password, please contact the hospital operator. Locate the [facility] provider you are looking for under [facility] and page to a number that you can be directly reached. If you still have difficulty reaching the provider, please page the [facility] (Director on Call) for the Hospitalists listed on amion for assistance.

## 2022-04-05 DIAGNOSIS — A419 Sepsis, unspecified organism: Secondary | ICD-10-CM | POA: Diagnosis not present

## 2022-04-05 LAB — SODIUM, URINE, RANDOM: Sodium, Ur: 41 mmol/L

## 2022-04-05 LAB — CBC WITH DIFFERENTIAL/PLATELET
Abs Immature Granulocytes: 0.07 10*3/uL (ref 0.00–0.07)
Basophils Absolute: 0 10*3/uL (ref 0.0–0.1)
Basophils Relative: 0 %
Eosinophils Absolute: 0 10*3/uL (ref 0.0–0.5)
Eosinophils Relative: 0 %
HCT: 21.3 % — ABNORMAL LOW (ref 36.0–46.0)
Hemoglobin: 7.4 g/dL — ABNORMAL LOW (ref 12.0–15.0)
Immature Granulocytes: 1 %
Lymphocytes Relative: 6 %
Lymphs Abs: 0.6 10*3/uL — ABNORMAL LOW (ref 0.7–4.0)
MCH: 29.7 pg (ref 26.0–34.0)
MCHC: 34.7 g/dL (ref 30.0–36.0)
MCV: 85.5 fL (ref 80.0–100.0)
Monocytes Absolute: 0.9 10*3/uL (ref 0.1–1.0)
Monocytes Relative: 10 %
Neutro Abs: 7.6 10*3/uL (ref 1.7–7.7)
Neutrophils Relative %: 83 %
Platelets: 150 10*3/uL (ref 150–400)
RBC: 2.49 MIL/uL — ABNORMAL LOW (ref 3.87–5.11)
RDW: 15.6 % — ABNORMAL HIGH (ref 11.5–15.5)
WBC: 9.2 10*3/uL (ref 4.0–10.5)
nRBC: 0 % (ref 0.0–0.2)

## 2022-04-05 LAB — BPAM RBC
Blood Product Expiration Date: 202403182359
Blood Product Expiration Date: 202403182359
ISSUE DATE / TIME: 202402151227
ISSUE DATE / TIME: 202402160946
Unit Type and Rh: 5100
Unit Type and Rh: 5100

## 2022-04-05 LAB — TYPE AND SCREEN
ABO/RH(D): O POS
Antibody Screen: NEGATIVE
Unit division: 0
Unit division: 0

## 2022-04-05 LAB — CBC
HCT: 24.7 % — ABNORMAL LOW (ref 36.0–46.0)
Hemoglobin: 8.3 g/dL — ABNORMAL LOW (ref 12.0–15.0)
MCH: 29.6 pg (ref 26.0–34.0)
MCHC: 33.6 g/dL (ref 30.0–36.0)
MCV: 88.2 fL (ref 80.0–100.0)
Platelets: 179 10*3/uL (ref 150–400)
RBC: 2.8 MIL/uL — ABNORMAL LOW (ref 3.87–5.11)
RDW: 16.3 % — ABNORMAL HIGH (ref 11.5–15.5)
WBC: 9.5 10*3/uL (ref 4.0–10.5)
nRBC: 0 % (ref 0.0–0.2)

## 2022-04-05 LAB — BASIC METABOLIC PANEL
Anion gap: 6 (ref 5–15)
BUN: 9 mg/dL (ref 8–23)
CO2: 23 mmol/L (ref 22–32)
Calcium: 7.5 mg/dL — ABNORMAL LOW (ref 8.9–10.3)
Chloride: 91 mmol/L — ABNORMAL LOW (ref 98–111)
Creatinine, Ser: 0.74 mg/dL (ref 0.44–1.00)
GFR, Estimated: >60 mL/min (ref >60)
Glucose, Bld: 131 mg/dL — ABNORMAL HIGH (ref 70–99)
Potassium: 4.3 mmol/L (ref 3.5–5.1)
Sodium: 120 mmol/L — ABNORMAL LOW (ref 135–145)

## 2022-04-05 LAB — SODIUM
Sodium: 123 mmol/L — ABNORMAL LOW (ref 135–145)
Sodium: 128 mmol/L — ABNORMAL LOW (ref 135–145)

## 2022-04-05 LAB — OSMOLALITY, URINE: Osmolality, Ur: 344 mOsm/kg (ref 300–900)

## 2022-04-05 NOTE — Progress Notes (Signed)
PROGRESS NOTE    Madison Burns  [id number] DOB: January 01, 1951 DOA: 04/01/2022 PCP: Einar Pheasant, MD   Brief Narrative:  Madison Burns is a 72 y.o. Caucasian female with medical history significant for myasthenia gravis, GERD, hypertension, dyslipidemia, hypothyroidism, IBS, OSA on CPAP, who presented to the emergency room with a [name] of recurrent falls.  She had a couple falls with subsequent head injury and facial contusion mainly in the nose.  She stated that she tripped and fell.  No presyncope or syncope.  No headache or dizziness or blurred vision.  No paresthesias or focal muscle weakness.  No weakness seizures.  She had subsequent left hand swelling and pain and was found to have fifth metacarpal fracture.  No dysuria, oliguria or hematuria, urgency or frequency or flank pain.   ED Course: When she came to the ER, BP was 98/52 and temperature 97.4 with otherwise normal vital signs.  BP later on was 113/65.  She did not require any oxygen.  ABG was unremarkable.  CMP was remarkable for hyponatremia and hypochloremia, hypokalemia and a BUN of 24 with creatinine 1.23, magnesium 1.6 and high sensitive troponin I was 8.  Lactic acid was 5.2 and later 2.3 and procalcitonin less than 0.1.  CBC showed leukocytosis 19.4 with neutrophilia, anemia that is new.  Coag profile was within normal.  TSH was 4.34 and free T41.29. EKG as reviewed by me : Normal sinus rhythm with a rate of 78 with suspected left atrial enlargement, LVH and prolonged QT interval. Imaging: Portable chest x-ray showed no acute cardiopulmonary disease.  It showed mild right perihilar distortion that is stable.  Left hand x-ray showed acute to subacute nondisplaced fracture involving the proximal shaft of the fifth metacarpal.  Abdominal and pelvic CT scan revealed the following: 1. 12.7 cm intramuscular and subcutaneous hematoma within the right gluteal region. Extensive overlying subcutaneous edema. 2. Stable post  treatment change within the anterior mediastinum. 3. Stable post ablated changes within the lower pole of the left kidney. 4. Moderate sigmoid diverticulosis without superimposed acute inflammatory change.  Assessment & Plan:   Principal Problem:   Sepsis due to undetermined organism [facility]) Active Problems:   Frequent falls   Nondisplaced fracture of shaft of fifth metacarpal bone, left hand, initial encounter for closed fracture   Myasthenia gravis ([place])   Hypothyroidism   Paroxysmal atrial fibrillation (HCC)   GERD without esophagitis  Sepsis due to undetermined organism ([place]), ruled out - Upon presentation, she had mild tachypnea and significant leukocytosis and significantly elevated lactic acid level.  However patient is afebrile and her procalcitonin is unremarkable as well.  Her presentation could very well be just because of the dehydration.  I do not think there is any evidence of infection or sepsis, sepsis ruled out.  Antibiotics discontinued on the morning of 04/02/2022.  Patient has remained stable, afebrile.  No leukocytosis.   Frequent falls/generalized weakness: Presumably this was secondary to hyponatremia. She is evaluated by PT and OT and they have recommended SNF.  Sister does not like the idea of breath she is okay if that is needed.   Nondisplaced fracture of shaft of fifth metacarpal bone, left hand, initial encounter for closed fracture - The patient had her left hand splinted.  Orthopedics consulted, they recommended continuing current management and follow-up outpatient in 2 weeks.   GERD without esophagitis - We will continue PPI therapy.   Paroxysmal atrial fibrillation (HCC) - We will continue amiodarone. - The patient has  IVC and high fall risk and therefore is not on any anticoagulations.   Hypothyroidism - We will continue Synthroid.   Myasthenia gravis ([place]) - We will continue her pyridostigmine.  Acute blood loss anemia: No history of melena or  GI bleed.  Hemoglobin dropped to 6.3 on 04/03/2022, likely due to large subcutaneous hematoma within the gluteal region.  She was given 1 unit of PRBC transfusion.  Hemoglobin improved but dropped to 6.9 again on 04/04/2022 and she was given 1 unit of care with symptoms.  Again today and hemoglobin is 7.4 today.  Will repeat in the morning.  FOBT still pending.  Monitor CBC every 12 hours.  Intramuscular and subcutaneous hematoma within the right gluteal region: 12.7 cm in size.  Has edema but no erythema.  Hypokalemia: Resolved  Acute hyponatremia: Presented with 124 which further dropped to 118.  Workup indicates SIADH.  I noticed that patient is on desmopressin.  Discussed with the sister, she tells me that she was started recently, few months ago on this medication for excessive urination.  I have discontinued that since last night, sodium has started to improve, currently 120.  Monitor every 8 hours.  She is also on amiodarone which can cause SIADH as well but due to A-fib, we will need to continue that medication for now, if no improvement in sodium, will need to seek cardiology's opinion on that.   Dehydration/lactic acidosis: Lactic acidosis resolved, she appears to be better hydrated now.  IV fluids discontinued.  DVT prophylaxis: Place and maintain sequential compression device Start: 04/02/22 1129on SCDs due to large gluteal hematoma.   Code Status: DNR  Family Communication: Sister present at bedside.  Plan of care discussed with patient in length and he/she verbalized understanding and agreed with it.  Status is: Inpatient Remains inpatient appropriate because:severe hyponatremia, monitoring of recurrent anemia.   Estimated body mass index is 40.11 kg/m as calculated from the following:   Height as of this encounter: 5' 4"$  (1.626 m).   Weight as of this encounter: 106 kg.  Pressure Injury 10/23/19 Nose Stage 1 -  Intact skin with non-blanchable redness of a localized area usually  over a bony prominence. (Active)  10/23/19 1110  Location: Nose  Location Orientation:   Staging: Stage 1 -  Intact skin with non-blanchable redness of a localized area usually over a bony prominence.  Wound Description (Comments):   Present on Admission:      Pressure Injury Buttocks Left Stage 2 -  Partial thickness loss of dermis presenting as a shallow open injury with a red, pink wound bed without slough. (Active)     Location: Buttocks  Location Orientation: Left  Staging: Stage 2 -  Partial thickness loss of dermis presenting as a shallow open injury with a red, pink wound bed without slough.  Wound Description (Comments):   Present on Admission: Yes (per pt sister from previous biopsy)     Pressure Injury 04/10/20 Buttocks Mid Stage 1 -  Intact skin with non-blanchable redness of a localized area usually over a bony prominence. (Active)  04/10/20 0937  Location: Buttocks  Location Orientation: Mid  Staging: Stage 1 -  Intact skin with non-blanchable redness of a localized area usually over a bony prominence.  Wound Description (Comments):   Present on Admission:    Nutritional Assessment: Body mass index is 40.11 kg/m.[name] Seen by dietician.  I agree with the assessment and plan as outlined below: Nutrition Status:        .  Skin Assessment: I have examined the patient's skin and I agree with the wound assessment as performed by the wound care RN as outlined below: Pressure Injury 10/23/19 Nose Stage 1 -  Intact skin with non-blanchable redness of a localized area usually over a bony prominence. (Active)  10/23/19 1110  Location: Nose  Location Orientation:   Staging: Stage 1 -  Intact skin with non-blanchable redness of a localized area usually over a bony prominence.  Wound Description (Comments):   Present on Admission:      Pressure Injury Buttocks Left Stage 2 -  Partial thickness loss of dermis presenting as a shallow open injury with a red, pink wound bed  without slough. (Active)     Location: Buttocks  Location Orientation: Left  Staging: Stage 2 -  Partial thickness loss of dermis presenting as a shallow open injury with a red, pink wound bed without slough.  Wound Description (Comments):   Present on Admission: Yes (per pt sister from previous biopsy)     Pressure Injury 04/10/20 Buttocks Mid Stage 1 -  Intact skin with non-blanchable redness of a localized area usually over a bony prominence. (Active)  04/10/20 0937  Location: Buttocks  Location Orientation: Mid  Staging: Stage 1 -  Intact skin with non-blanchable redness of a localized area usually over a bony prominence.  Wound Description (Comments):   Present on Admission:     Consultants:  Orthopedics  Procedures:  None  Antimicrobials:  Anti-infectives (From admission, onward)    Start     Dose/Rate Route Frequency Ordered Stop   04/02/22 1200  linezolid (ZYVOX) IVPB 600 mg  Status:  Discontinued        600 mg 300 mL/hr over 60 Minutes Intravenous Every 12 hours 04/02/22 0336 04/02/22 1125   04/02/22 1200  ceFEPIme (MAXIPIME) 2 g in sodium chloride 0.9 % 100 mL IVPB  Status:  Discontinued        2 g 200 mL/hr over 30 Minutes Intravenous Every 12 hours 04/02/22 0352 04/02/22 1125   04/02/22 0800  ceFEPIme (MAXIPIME) 2 g in sodium chloride 0.9 % 100 mL IVPB  Status:  Discontinued        2 g 200 mL/hr over 30 Minutes Intravenous Every 8 hours 04/02/22 0335 04/02/22 0352   04/02/22 0345  metroNIDAZOLE (FLAGYL) IVPB 500 mg  Status:  Discontinued        500 mg 100 mL/hr over 60 Minutes Intravenous Every 12 hours 04/02/22 0335 04/02/22 1125   04/01/22 2345  linezolid (ZYVOX) IVPB 600 mg        600 mg 300 mL/hr over 60 Minutes Intravenous  Once 04/01/22 2338 04/02/22 0118   04/01/22 2330  linezolid (ZYVOX) IVPB 600 mg  Status:  Discontinued        600 mg 300 mL/hr over 60 Minutes Intravenous Every 12 hours 04/01/22 2324 04/01/22 2338   04/01/22 2330  ceFEPIme (MAXIPIME)  2 g in sodium chloride 0.9 % 100 mL IVPB        2 g 200 mL/hr over 30 Minutes Intravenous  Once 04/01/22 2325 04/02/22 0041         Subjective:  Seen and examined.  She has no complaints.  Sister at the bedside.  Objective: Vitals:   04/04/22 1555 04/04/22 1952 04/05/22 0524 04/05/22 0841  BP: (!) 106/56 120/65 (!) 105/47 (!) 92/45  Pulse: 75 75 86 89  Resp: 16 20 18 18  $ Temp: 98.7 F (37.1 C) 98.8 F (37.1  C) 99.8 F (37.7 C) 98.2 F (36.8 C)  TempSrc:  Oral Oral   SpO2: 96% 97% 96% 96%  Weight:      Height:        Intake/Output Summary (Last 24 hours) at 04/05/2022 0958 Last data filed at 04/05/2022 0901 [place] per 24 hour  Intake 1275.67 ml  Output 4050 ml  Net -2774.33 ml    Filed Weights   04/01/22 2140  Weight: 106 kg    Examination:  General exam: Appears calm and comfortable  Respiratory system: Clear to auscultation. Respiratory effort normal. Cardiovascular system: S1 & S2 heard, RRR. No JVD, murmurs, rubs, gallops or clicks. No pedal edema. Gastrointestinal system: Abdomen is nondistended, soft and nontender. No organomegaly or masses felt. Normal bowel sounds heard. Central nervous system: Alert and oriented. No focal neurological deficits. Extremities: Symmetric 5 x 5 power. Skin: No rashes, lesions or ulcers.   Data Reviewed: I have personally reviewed following labs and imaging studies  CBC: Recent Labs  Lab 04/01/22 2210 04/02/22 0834 04/03/22 0330 04/03/22 1945 04/04/22 0357 04/04/22 2011 04/05/22 0613  WBC 19.4* 9.5 7.7  --  6.6  --  9.2  NEUTROABS 16.4*  --  6.1  --  4.7  --  7.6  HGB 10.6* 7.8* 6.3* 7.6* 6.9* 8.7* 7.4*  HCT 33.1* 23.7* 19.1* 23.4* 20.9* 25.2* 21.3*  MCV 89.9 87.8 89.3  --  88.9  --  85.5  PLT 259 122* 119*  --  122*  --  [id number]    Basic Metabolic Panel: Recent Labs  Lab 04/01/22 2210 04/02/22 0610 04/02/22 0834 04/03/22 0330 04/03/22 0841 04/03/22 1308 04/04/22 0357 04/04/22 0821 04/04/22 1437  04/04/22 2011 04/05/22 0613  NA 124* 127*  --  126*  --    < > 118* 121* 119* 118* 120*  K 3.2* 3.4*  --  4.3  --   --  3.9  --   --   --  4.3  CL 89* 100  --  101  --   --  92*  --   --   --  91*  CO2 20* 22  --  20*  --   --  20*  --   --   --  23  GLUCOSE 193* 132*  --  106*  --   --  94  --   --   --  131*  BUN 24* 18  --  13  --   --  11  --   --   --  9  CREATININE 1.23* 0.88  --  0.94  --   --  0.68  --   --   --  0.74  CALCIUM 8.2* 7.5*  --  7.5*  --   --  7.0*  --   --   --  7.5*  MG 1.6*  --  1.6*  --  2.0  --   --   --   --   --   --    < > = values in this interval not displayed.    GFR: Estimated Creatinine Clearance: 77.7 mL/min (by C-G formula based on SCr of 0.74 mg/dL). Liver Function Tests: Recent Labs  Lab 04/01/22 2210  AST 35  ALT 19  ALKPHOS 55  BILITOT 0.6  PROT 5.4*  ALBUMIN 3.1*    No results for input(s): "LIPASE", "AMYLASE" in the last 168 hours. No results for input(s): "AMMONIA" in the last 168 hours. Coagulation Profile: Recent  Labs  Lab 04/01/22 2340 04/02/22 0610  INR 1.0 1.1    Cardiac Enzymes: No results for input(s): "CKTOTAL", "CKMB", "CKMBINDEX", "TROPONINI" in the last 168 hours. BNP (last 3 results) No results for input(s): "PROBNP" in the last 8760 hours. HbA1C: No results for input(s): "HGBA1C" in the last 72 hours. CBG: No results for input(s): "GLUCAP" in the last 168 hours. Lipid Profile: No results for input(s): "CHOL", "HDL", "LDLCALC", "TRIG", "CHOLHDL", "LDLDIRECT" in the last 72 hours. Thyroid Function Tests: No results for input(s): "TSH", "T4TOTAL", "FREET4", "T3FREE", "THYROIDAB" in the last 72 hours.  Anemia Panel: No results for input(s): "VITAMINB12", "FOLATE", "FERRITIN", "TIBC", "IRON", "RETICCTPCT" in the last 72 hours. Sepsis Labs: Recent Labs  Lab 04/01/22 2139 04/01/22 2210 04/02/22 0120 04/02/22 0610 04/02/22 0834  PROCALCITON  --  <0.10  --  <0.10  --   LATICACIDVEN 5.2*  --  2.3*  --  1.0      Recent Results (from the past 240 hour(s))  Blood Culture (routine x 2)     Status: None (Preliminary result)   Collection Time: 04/01/22 11:30 PM   Specimen: BLOOD  Result Value Ref Range Status   Specimen Description BLOOD  LEFT ARM  Final   Special Requests   Final    BOTTLES DRAWN AEROBIC AND ANAEROBIC Blood Culture results may not be optimal due to an inadequate volume of blood received in culture bottles   Culture   Final    NO GROWTH 4 DAYS Performed at [facility], [address] [place], [place] [address]    Report Status PENDING  Incomplete  Blood Culture (routine x 2)     Status: None (Preliminary result)   Collection Time: 04/01/22 11:40 PM   Specimen: BLOOD  Result Value Ref Range Status   Specimen Description BLOOD  RIGHT ARM  Final   Special Requests   Final    BOTTLES DRAWN AEROBIC AND ANAEROBIC Blood Culture adequate volume   Culture   Final    NO GROWTH 4 DAYS Performed at [facility], [address] [place], [place] [address]    Report Status PENDING  Incomplete  Resp panel by RT-PCR (RSV, Flu A&B, Covid) Anterior Nasal Swab     Status: None   Collection Time: 04/01/22 11:44 PM   Specimen: Anterior Nasal Swab  Result Value Ref Range Status   SARS Coronavirus 2 by RT PCR NEGATIVE NEGATIVE Final    Comment: (NOTE) SARS-CoV-2 target nucleic acids are NOT DETECTED.  The SARS-CoV-2 RNA is generally detectable in upper respiratory specimens during the acute phase of infection. The lowest concentration of SARS-CoV-2 viral copies this assay can detect is 138 copies/mL. A negative result does not preclude SARS-Cov-2 infection and should not be used as the sole basis for treatment or other patient management decisions. A negative result may occur with  improper specimen collection/handling, submission of specimen other than nasopharyngeal swab, presence of viral mutation(s) within the areas targeted by this assay, and inadequate number  of viral copies(<138 copies/mL). A negative result must be combined with clinical observations, patient history, and epidemiological information. The expected result is Negative.  Fact Sheet for Patients:  EntrepreneurPulse.com.au  Fact Sheet for Healthcare Providers:  IncredibleEmployment.be  This test is no t yet approved or cleared by the [place] FDA and  has been authorized for detection and/or diagnosis of SARS-CoV-2 by FDA under an Emergency Use Authorization (EUA). This EUA will remain  in effect (meaning this test can be  used) for the duration of the COVID-19 declaration under Section 564(b)(1) of the Act, 21 U.S.C.section 360bbb-3(b)(1), unless the authorization is terminated  or revoked sooner.       Influenza A by PCR NEGATIVE NEGATIVE Final   Influenza B by PCR NEGATIVE NEGATIVE Final    Comment: (NOTE) The Xpert Xpress SARS-CoV-2/FLU/RSV plus assay is intended as an aid in the diagnosis of influenza from Nasopharyngeal swab specimens and should not be used as a sole basis for treatment. Nasal washings and aspirates are unacceptable for Xpert Xpress SARS-CoV-2/FLU/RSV testing.  Fact Sheet for Patients: EntrepreneurPulse.com.au  Fact Sheet for Healthcare Providers: IncredibleEmployment.be  This test is not yet approved or cleared by the [place] FDA and has been authorized for detection and/or diagnosis of SARS-CoV-2 by FDA under an Emergency Use Authorization (EUA). This EUA will remain in effect (meaning this test can be used) for the duration of the COVID-19 declaration under Section 564(b)(1) of the Act, 21 U.S.C. section 360bbb-3(b)(1), unless the authorization is terminated or revoked.     Resp Syncytial Virus by PCR NEGATIVE NEGATIVE Final    Comment: (NOTE) Fact Sheet for Patients: EntrepreneurPulse.com.au  Fact Sheet for Healthcare  Providers: IncredibleEmployment.be  This test is not yet approved or cleared by the [place] FDA and has been authorized for detection and/or diagnosis of SARS-CoV-2 by FDA under an Emergency Use Authorization (EUA). This EUA will remain in effect (meaning this test can be used) for the duration of the COVID-19 declaration under Section 564(b)(1) of the Act, 21 U.S.C. section 360bbb-3(b)(1), unless the authorization is terminated or revoked.  Performed at [facility], [address] [place], [place] [address]   Urine Culture (for pregnant, neutropenic or urologic patients or patients with an indwelling urinary catheter)     Status: None   Collection Time: 04/02/22  3:15 AM   Specimen: Urine, Clean Catch  Result Value Ref Range Status   Specimen Description   Final    URINE, CLEAN CATCH Performed at [facility], [address], [place], [place] [address]    Special Requests   Final    NONE Performed at [facility], [address], [place], [place] [address]    Culture   Final    NO GROWTH Performed at [facility], [address], [place], [place] [address]    Report Status 04/03/2022 FINAL  Final     Radiology Studies: No results found.  Scheduled Meds:  sodium chloride   Intravenous Once   acidophilus  1 capsule Oral BID   amiodarone  200 mg Oral Daily   vitamin C  1,000 mg Oral QHS   B-complex with vitamin C  1 tablet Oral QPM   calcium-vitamin D   Oral Daily   diltiazem  180 mg Oral Daily   DULoxetine  30 mg Oral Daily   enoxaparin (LOVENOX) injection  0.5 mg/kg Subcutaneous Q24H   fesoterodine  4 mg Oral Daily   folic acid  1 mg Oral Daily   levothyroxine  75 mcg Oral Q0600   pantoprazole  40 mg Oral Daily   pyridostigmine  60 mg Oral TID   risperiDONE  1 mg Oral QHS   sodium chloride  1 g Oral BID WC   vitamin E  400 Units Oral Q1400   Continuous Infusions:     LOS: 3 days   Darliss Cheney,  MD [facility]  04/05/2022, 9:58 AM   *Please note that this is  a verbal dictation therefore any spelling or grammatical errors are due to the "[place] [employer]" system interpretation.  Please page via [place] and do not message via secure chat for urgent patient care matters. Secure chat can be used for non urgent patient care matters.  How to contact the [facility] Attending or Consulting provider [place] or covering provider during after hours [place], for this patient?  Check the care team in [facility] and look for a) attending/consulting [facility] provider listed and b) the [facility] team listed. Page or secure chat [phone]. Log into [email or web address] and use [place] universal password to access. If you do not have the password, please contact the hospital operator. Locate the [facility] provider you are looking for under [facility] and page to a number that you can be directly reached. If you still have difficulty reaching the provider, please page the [facility] (Director on Call) for the Hospitalists listed on amion for assistance.

## 2022-04-05 NOTE — Progress Notes (Signed)
Physical Therapy Treatment (Co-Treat) Patient Details Name: Madison Burns MRN: [id number] DOB: 1950/09/15 Today's Date: 04/05/2022   History of Present Illness Madison Burns is a 72 y.o. Caucasian female with medical history significant for myasthenia gravis, GERD, hypertension, dyslipidemia, hypothyroidism, IBS, OSA on CPAP, who presented to the emergency room with a [name] of recurrent falls.  She had a couple falls with subsequent head injury and facial contusion mainly in the nose.  found to have fifth metacarpal fracture.    PT Comments    Pt was up in chair at start of session. PT/OT co-treated this session. PT instructed patient in LE strengthening/ROM exercise. Patient required verbal/visual cues to increase AROM for flexibility as well as to increase strength and motor control. Patient tolerated well. PT brought left platform walker for patient to trial. She was able to tolerate well. However consider trialing bariatric walker with platform as the regular size walker was too narrow. Patient does require frequent verbal cues for proper hand placement with transfers for safety. She required min A +2 for sit<>Stand transfer. Patient was able to take a few steps and ambulated approximately 4 feet with PF RW. She then required sitting rest break. PT assessed vitals. Patient was short of breath. HR was elevated at 103 bpm, but SPO2 was WNL. Patient would benefit from additional skilled PT Intervention to improve strength and mobility. Current plan of SNF rehab remains appropriate.    Recommendations for follow up therapy are one component of a multi-disciplinary discharge planning process, led by the attending physician.  Recommendations may be updated based on patient status, additional functional criteria and insurance authorization.  Follow Up Recommendations  Skilled nursing-short term rehab (<3 hours/day)     Assistance Recommended at Discharge Frequent or constant  Supervision/Assistance  Patient can return home with the following Two people to help with walking and/or transfers;Two people to help with bathing/dressing/bathroom   Equipment Recommendations       Recommendations for Other Services       Precautions / Restrictions Precautions Precautions: Fall Required Braces or Orthoses: Splint/Cast Splint/Cast: on L hand 2/2 5th metacarpal fx. Restrictions Weight Bearing Restrictions: No Other Position/Activity Restrictions: No WB restrictions listed in orders; however, pt does have fx in L hand and spint wrapped.     Mobility  Bed Mobility               General bed mobility comments: pt up in chair for PT treatment;    Transfers Overall transfer level: Needs assistance Equipment used: Left platform walker Transfers: Sit to/from Stand Sit to Stand: Min assist, +2 physical assistance           General transfer comment: Requires frequent cues for hand placement including to rest LUE on platform and position RUE on arm rest for safety; Pt with uncontrolled sit down requiring assistance for safety;    Ambulation/Gait Ambulation/Gait assistance: Mod assist, +2 physical assistance Gait Distance (Feet): 4 Feet Assistive device: Left platform walker   Gait velocity: decreased     General Gait Details: Pt ambulated with LUE platform walker with wide base of support, flexed posture, with decreased heel strike and flat foot at initial contact with increased lateral trunk lean as stepping forward; Required +2 assistance for walker management and positioning; Poor safety awareness with repeated cues for erect posture;   Stairs             Wheelchair Mobility    Modified Rankin (Stroke Patients Only)  Balance Overall balance assessment: Needs assistance Sitting-balance support: No upper extremity supported Sitting balance-Leahy Scale: Good Sitting balance - Comments: pt able to sit in recliner and reposition trunk  forward/backward without instability;   Standing balance support: Bilateral upper extremity supported Standing balance-Leahy Scale: Poor Standing balance comment: Requires +2 for balance                            Cognition                                                Exercises Other Exercises Other Exercises: Instructed patient in BLE strengthening exercise: Seated with legs elevated, ankle pump x10 reps with cues to slow down LE movement for increased ROM/strengthening; Other Exercises: Also instructed patient in alternate LAQ sitting in chair with feet on floor with visual cues to increase ROM for increased flexibility/strengthening x15 reps each LE: Pt reports mild soreness in BLE during LAQ but was able to tolerate full ROM    General Comments        Pertinent Vitals/Pain Pain Assessment Pain Assessment: Faces Faces Pain Scale: Hurts a little bit Pain Location: LUE hand Pain Descriptors / Indicators: Aching Pain Intervention(s): Limited activity within patient's tolerance, Monitored during session, Repositioned    Home Living                          Prior Function            PT Goals (current goals can now be found in the care plan section) Acute Rehab PT Goals Patient Stated Goal: to get stronger before return home PT Goal Formulation: With patient/family Time For Goal Achievement: 04/17/22 Potential to Achieve Goals: Fair Progress towards PT goals: Progressing toward goals    Frequency    Min 2X/week      PT Plan Current plan remains appropriate    Co-evaluation   Reason for Co-Treatment: For patient/therapist safety;To address functional/ADL transfers PT goals addressed during session: Mobility/safety with mobility;Proper use of DME;Strengthening/ROM OT goals addressed during session: Strengthening/ROM;Proper use of Adaptive equipment and DME      AM-PAC PT "6 Clicks" Mobility   Outcome Measure  Help  needed turning from your back to your side while in a flat bed without using bedrails?: A Little Help needed moving from lying on your back to sitting on the side of a flat bed without using bedrails?: A Little Help needed moving to and from a bed to a chair (including a wheelchair)?: A Lot Help needed standing up from a chair using your arms (e.g., wheelchair or bedside chair)?: A Lot Help needed to walk in hospital room?: A Lot Help needed climbing 3-5 steps with a railing? : A Lot 6 Click Score: 14    End of Session Equipment Utilized During Treatment: Gait belt Activity Tolerance: Patient tolerated treatment well Patient left: with chair alarm set;in chair;with family/visitor present Nurse Communication: Mobility status PT Visit Diagnosis: Difficulty in walking, not elsewhere classified (R26.2);Muscle weakness (generalized) (M62.81)     Time: [id number] PT Time Calculation (min) (ACUTE ONLY): 29 min  Charges:  $Therapeutic Activity: 8-22 mins                        Cristella Stiver PT, DPT  04/05/2022, 3:17 PM

## 2022-04-05 NOTE — Progress Notes (Signed)
NIF -30 

## 2022-04-05 NOTE — Progress Notes (Signed)
NIF - 36

## 2022-04-05 NOTE — Progress Notes (Signed)
Occupational Therapy Treatment Patient Details Name: Madison Burns MRN: [id number] DOB: [birth year] Today's Date: 04/05/2022   History of present illness Madison Burns is a 72 y.o. Caucasian female with medical history significant for myasthenia gravis, GERD, hypertension, dyslipidemia, hypothyroidism, IBS, OSA on CPAP, who presented to the emergency room with a [name] of recurrent falls.  She had a couple falls with subsequent head injury and facial contusion mainly in the nose.  found to have fifth metacarpal fracture.   OT comments  Pt received seated in recliner; BIL LE elevated; sister in room and supportive during session. Appearing alert; tangential; pleasant; willing to work with OT on transfers and mobility while trying platform walker. T/f MIN A x2 to standing; able to take about 5 steps forward. See flowsheet below for further details of session. Left seated in recliner, sister in room, chair alarm on, with all needs in reach.     Recommendations for follow up therapy are one component of a multi-disciplinary discharge planning process, led by the attending physician.  Recommendations may be updated based on patient status, additional functional criteria and insurance authorization.    Follow Up Recommendations  Skilled nursing-short term rehab (<3 hours/day)     Assistance Recommended at Discharge Frequent or constant Supervision/Assistance  Patient can return home with the following  A lot of help with walking and/or transfers;A lot of help with bathing/dressing/bathroom;Help with stairs or ramp for entrance   Equipment Recommendations  Other (comment) (defer to next venue of care)    Recommendations for Other Services      Precautions / Restrictions Precautions Precautions: Fall Required Braces or Orthoses: Splint/Cast Splint/Cast: on L hand 2/2 5th metacarpal fx. Restrictions Weight Bearing Restrictions: No Other Position/Activity Restrictions: No WB restrictions  listed in orders; however, pt does have fx in L hand and spint wrapped.       Mobility Bed Mobility               General bed mobility comments: NT today; pt sitting in chair upon OT arrival and exit    Transfers Overall transfer level: Needs assistance Equipment used: Left platform walker Transfers: Sit to/from Stand Sit to Stand: Min assist, +2 physical assistance (from chair)           General transfer comment: Pt completed 3 sit to stands during session today; poor eccentric control on stand to sit; third time was improved once pt followed cues for reaching back with R hand on armrest of the chair. On second stand, pt able to take approx 5 steps forward; chair needing to be brought behind pt abruptly, as she became fatigued and needed to sit quickly; OT had to guide pt's hips, as she did not wait or follow instructions once she had decided to sit.     Balance Overall balance assessment: Needs assistance Sitting-balance support: No upper extremity supported Sitting balance-Leahy Scale: Good     Standing balance support: Bilateral upper extremity supported Standing balance-Leahy Scale: Poor                             ADL either performed or assessed with clinical judgement   ADL Overall ADL's : Needs assistance/impaired                                       General ADL Comments: MAX A  to don underwear today prior to mobility; Dependent for ensuring bowel hygiene in standing (as pt had some slight smear on her chuck pad observed on standing).    Extremity/Trunk Assessment Upper Extremity Assessment Upper Extremity Assessment: LUE deficits/detail LUE Deficits / Details: L hand in ulnar gutter splint   Lower Extremity Assessment Lower Extremity Assessment: Generalized weakness        Vision       Perception     Praxis      Cognition Arousal/Alertness: Awake/alert Behavior During Therapy: WFL for tasks  assessed/performed Overall Cognitive Status: History of cognitive impairments - at baseline                                 General Comments: Sister in room; correcting pt at times. Pt tangential, but pleasant, friendly, motivated. Needing slight reassurance today. Impulsive and often moving before directions are given.        Exercises Exercises: General Upper Extremity General Exercises - Upper Extremity Shoulder Flexion: AROM, 15 reps, Both, Seated    Shoulder Instructions       General Comments      Pertinent Vitals/ Pain       Pain Assessment Pain Assessment: 0-10 Pain Score:  (unrated) Pain Location: LUE hand and BIL legs with standing Pain Descriptors / Indicators: Aching Pain Intervention(s): Limited activity within patient's tolerance  Home Living                                          Prior Functioning/Environment              Frequency  Min 2X/week        Progress Toward Goals  OT Goals(current goals can now be found in the care plan section)  Progress towards OT goals: Progressing toward goals  Acute Rehab OT Goals Patient Stated Goal: get better OT Goal Formulation: With patient/family Time For Goal Achievement: 04/17/22 Potential to Achieve Goals: Good ADL Goals Pt Will Perform Grooming: with set-up;with supervision;standing Pt Will Perform Lower Body Dressing: with min assist;sitting/lateral leans Pt Will Transfer to Toilet: with min assist;ambulating;regular height toilet  Plan Discharge plan remains appropriate    Co-evaluation    PT/OT/SLP Co-Evaluation/Treatment: Yes Reason for Co-Treatment: For patient/therapist safety;To address functional/ADL transfers PT goals addressed during session: Mobility/safety with mobility;Proper use of DME;Strengthening/ROM OT goals addressed during session: Strengthening/ROM;Proper use of Adaptive equipment and DME      AM-PAC OT "6 Clicks" Daily Activity      Outcome Measure   Help from another person eating meals?: None Help from another person taking care of personal grooming?: A Little Help from another person toileting, which includes using toliet, bedpan, or urinal?: Total Help from another person bathing (including washing, rinsing, drying)?: A Lot Help from another person to put on and taking off regular upper body clothing?: A Little Help from another person to put on and taking off regular lower body clothing?: Total 6 Click Score: 14    End of Session Equipment Utilized During Treatment: Other (comment);Gait belt (L platform RW)  OT Visit Diagnosis: Other abnormalities of gait and mobility (R26.89);Muscle weakness (generalized) (M62.81)   Activity Tolerance Patient tolerated treatment well   Patient Left in chair;with call bell/phone within reach;with chair alarm set;with family/visitor present   Nurse Communication Mobility status  Time: [id number] OT Time Calculation (min): 29 min  Charges: OT General Charges $OT Visit: 1 Visit OT Treatments $Therapeutic Activity: 8-22 mins  Waymon Amato, MS, OTR/L   Vania Rea 04/05/2022, 3:10 PM

## 2022-04-06 DIAGNOSIS — A419 Sepsis, unspecified organism: Secondary | ICD-10-CM | POA: Diagnosis not present

## 2022-04-06 LAB — BASIC METABOLIC PANEL
Anion gap: 5 (ref 5–15)
BUN: 12 mg/dL (ref 8–23)
CO2: 25 mmol/L (ref 22–32)
Calcium: 7.7 mg/dL — ABNORMAL LOW (ref 8.9–10.3)
Chloride: 102 mmol/L (ref 98–111)
Creatinine, Ser: 0.79 mg/dL (ref 0.44–1.00)
GFR, Estimated: >60 mL/min (ref >60)
Glucose, Bld: 116 mg/dL — ABNORMAL HIGH (ref 70–99)
Potassium: 4.3 mmol/L (ref 3.5–5.1)
Sodium: 132 mmol/L — ABNORMAL LOW (ref 135–145)

## 2022-04-06 LAB — CBC
HCT: 22 % — ABNORMAL LOW (ref 36.0–46.0)
HCT: 22.4 % — ABNORMAL LOW (ref 36.0–46.0)
Hemoglobin: 7.4 g/dL — ABNORMAL LOW (ref 12.0–15.0)
Hemoglobin: 7.3 g/dL — ABNORMAL LOW (ref 12.0–15.0)
MCH: 29.7 pg (ref 26.0–34.0)
MCH: 29.8 pg (ref 26.0–34.0)
MCHC: 33.6 g/dL (ref 30.0–36.0)
MCHC: 32.6 g/dL (ref 30.0–36.0)
MCV: 88.4 fL (ref 80.0–100.0)
MCV: 91.4 fL (ref 80.0–100.0)
Platelets: 178 10*3/uL (ref 150–400)
Platelets: 206 10*3/uL (ref 150–400)
RBC: 2.49 MIL/uL — ABNORMAL LOW (ref 3.87–5.11)
RBC: 2.45 MIL/uL — ABNORMAL LOW (ref 3.87–5.11)
RDW: 16.7 % — ABNORMAL HIGH (ref 11.5–15.5)
RDW: 17.2 % — ABNORMAL HIGH (ref 11.5–15.5)
WBC: 7.3 10*3/uL (ref 4.0–10.5)
WBC: 7.3 10*3/uL (ref 4.0–10.5)
nRBC: 0 % (ref 0.0–0.2)
nRBC: 0 % (ref 0.0–0.2)

## 2022-04-06 LAB — CULTURE, BLOOD (ROUTINE X 2)
Culture: NO GROWTH
Culture: NO GROWTH
Special Requests: ADEQUATE

## 2022-04-06 LAB — SODIUM: Sodium: 131 mmol/L — ABNORMAL LOW (ref 135–145)

## 2022-04-06 LAB — LACTIC ACID, PLASMA: Lactic Acid, Venous: 1 mmol/L (ref 0.5–1.9)

## 2022-04-06 NOTE — Progress Notes (Signed)
Pt did NIF of -30 with good effort.

## 2022-04-06 NOTE — Progress Notes (Signed)
PROGRESS NOTE    Madison Burns  [id number] DOB: [birth year] DOA: 04/01/2022 PCP: Einar Pheasant, MD   Brief Narrative:  Madison Burns is a 72 y.o. Caucasian female with medical history significant for myasthenia gravis, GERD, hypertension, dyslipidemia, hypothyroidism, IBS, OSA on CPAP, who presented to the emergency room with a [name] of recurrent falls.  She had a couple falls with subsequent head injury and facial contusion mainly in the nose.  She stated that she tripped and fell.  No presyncope or syncope.  No headache or dizziness or blurred vision.  No paresthesias or focal muscle weakness.  No weakness seizures.  She had subsequent left hand swelling and pain and was found to have fifth metacarpal fracture.  No dysuria, oliguria or hematuria, urgency or frequency or flank pain.   ED Course: When she came to the ER, BP was 98/52 and temperature 97.4 with otherwise normal vital signs.  BP later on was 113/65.  She did not require any oxygen.  ABG was unremarkable.  CMP was remarkable for hyponatremia and hypochloremia, hypokalemia and a BUN of 24 with creatinine 1.23, magnesium 1.6 and high sensitive troponin I was 8.  Lactic acid was 5.2 and later 2.3 and procalcitonin less than 0.1.  CBC showed leukocytosis 19.4 with neutrophilia, anemia that is new.  Coag profile was within normal.  TSH was 4.34 and free T41.29. EKG as reviewed by me : Normal sinus rhythm with a rate of 78 with suspected left atrial enlargement, LVH and prolonged QT interval. Imaging: Portable chest x-ray showed no acute cardiopulmonary disease.  It showed mild right perihilar distortion that is stable.  Left hand x-ray showed acute to subacute nondisplaced fracture involving the proximal shaft of the fifth metacarpal.  Abdominal and pelvic CT scan revealed the following: 1. 12.7 cm intramuscular and subcutaneous hematoma within the right gluteal region. Extensive overlying subcutaneous edema. 2. Stable post  treatment change within the anterior mediastinum. 3. Stable post ablated changes within the lower pole of the left kidney. 4. Moderate sigmoid diverticulosis without superimposed acute inflammatory change.  Assessment & Plan:   Principal Problem:   Sepsis due to undetermined organism [facility]) Active Problems:   Frequent falls   Nondisplaced fracture of shaft of fifth metacarpal bone, left hand, initial encounter for closed fracture   Myasthenia gravis ([place])   Hypothyroidism   Paroxysmal atrial fibrillation (HCC)   GERD without esophagitis  Sepsis due to undetermined organism ([place]), ruled out - Upon presentation, she had mild tachypnea and significant leukocytosis and significantly elevated lactic acid level.  However patient is afebrile and her procalcitonin is unremarkable as well.  Her presentation could very well be just because of the dehydration.  I do not think there is any evidence of infection or sepsis, sepsis ruled out.  Antibiotics discontinued on the morning of 04/02/2022.  Patient has remained stable, afebrile.  No leukocytosis.   Frequent falls/generalized weakness: Presumably this was secondary to hyponatremia. She is evaluated by PT and OT and they have recommended SNF.  Sister does not like the idea of breath she is okay if that is needed.   Nondisplaced fracture of shaft of fifth metacarpal bone, left hand, initial encounter for closed fracture - The patient had her left hand splinted.  Orthopedics consulted, they recommended continuing current management and follow-up outpatient in 2 weeks.   GERD without esophagitis - We will continue PPI therapy.   Paroxysmal atrial fibrillation (HCC) - We will continue amiodarone. - The patient has  IVC and high fall risk and therefore is not on any anticoagulations.   Hypothyroidism - We will continue Synthroid.   Myasthenia gravis ([place]) - We will continue her pyridostigmine.  Acute blood loss anemia: No history of melena or  GI bleed.  Hemoglobin dropped to 6.3 on 04/03/2022, likely due to large subcutaneous hematoma within the gluteal region.  She was given 1 unit of PRBC transfusion.  Hemoglobin improved but dropped to 6.9 again on 04/04/2022 and she was given 1 unit of PRBC transfusion, hemoglobin improved to 8.3 yesterday but dropped to 7.4 again today.  Per nurse report, stool was collected for FOBT 2 days ago but it was never received by the lab, FOBT is being repeated.  No reports of melena or bright red blood per rectum by patient or the family.  Monitor H&H every 12 hours and transfuse if less than 7.    Intramuscular and subcutaneous hematoma within the right gluteal region: 12.7 cm in size.  Has edema but no erythema.  Hypokalemia: Resolved  Acute hyponatremia: Presented with 124 which further dropped to 118.  Workup indicates SIADH.  I noticed that patient is on desmopressin.  Discussed with the sister, she tells me that she was started recently, few months ago on this medication for excessive urination.  I have desmopressin on the night of 04/04/2022, patient's sodium has improved from 118 at that point in time to 132 today.  I have advised the family and the patient not to resume this medication.  Dehydration/lactic acidosis: Lactic acidosis resolved, she appears to be better hydrated now.  IV fluids discontinued.  Hypotension: Patient is now having hypotension but she is asymptomatic.  She is not on any antihypertensives but she is on rate control medications which is amiodarone and diltiazem which I will continue to prevent atrial fibrillation from going into RVR.  I will check lactic acid, if elevated, will give her some fluids.  DVT prophylaxis: Place and maintain sequential compression device Start: 04/02/22 1129on SCDs due to large gluteal hematoma.   Code Status: DNR  Family Communication: Sister present at bedside.  Plan of care discussed with patient in length and he/she verbalized understanding and  agreed with it.  Status is: Inpatient Remains inpatient appropriate because:severe hyponatremia, monitoring of recurrent anemia.  Also hypotensive today.   Estimated body mass index is 40.11 kg/m as calculated from the following:   Height as of this encounter: 5' 4"$  (1.626 m).   Weight as of this encounter: 106 kg.  Pressure Injury 10/23/19 Nose Stage 1 -  Intact skin with non-blanchable redness of a localized area usually over a bony prominence. (Active)  10/23/19 1110  Location: Nose  Location Orientation:   Staging: Stage 1 -  Intact skin with non-blanchable redness of a localized area usually over a bony prominence.  Wound Description (Comments):   Present on Admission:      Pressure Injury Buttocks Left Stage 2 -  Partial thickness loss of dermis presenting as a shallow open injury with a red, pink wound bed without slough. (Active)     Location: Buttocks  Location Orientation: Left  Staging: Stage 2 -  Partial thickness loss of dermis presenting as a shallow open injury with a red, pink wound bed without slough.  Wound Description (Comments):   Present on Admission: Yes (per pt sister from previous biopsy)     Pressure Injury 04/10/20 Buttocks Mid Stage 1 -  Intact skin with non-blanchable redness of a localized area usually  over a bony prominence. (Active)  04/10/20 0937  Location: Buttocks  Location Orientation: Mid  Staging: Stage 1 -  Intact skin with non-blanchable redness of a localized area usually over a bony prominence.  Wound Description (Comments):   Present on Admission:    Nutritional Assessment: Body mass index is 40.11 kg/m.[name] Seen by dietician.  I agree with the assessment and plan as outlined below: Nutrition Status:        . Skin Assessment: I have examined the patient's skin and I agree with the wound assessment as performed by the wound care RN as outlined below: Pressure Injury 10/23/19 Nose Stage 1 -  Intact skin with non-blanchable redness of  a localized area usually over a bony prominence. (Active)  10/23/19 1110  Location: Nose  Location Orientation:   Staging: Stage 1 -  Intact skin with non-blanchable redness of a localized area usually over a bony prominence.  Wound Description (Comments):   Present on Admission:      Pressure Injury Buttocks Left Stage 2 -  Partial thickness loss of dermis presenting as a shallow open injury with a red, pink wound bed without slough. (Active)     Location: Buttocks  Location Orientation: Left  Staging: Stage 2 -  Partial thickness loss of dermis presenting as a shallow open injury with a red, pink wound bed without slough.  Wound Description (Comments):   Present on Admission: Yes (per pt sister from previous biopsy)     Pressure Injury 04/10/20 Buttocks Mid Stage 1 -  Intact skin with non-blanchable redness of a localized area usually over a bony prominence. (Active)  04/10/20 0937  Location: Buttocks  Location Orientation: Mid  Staging: Stage 1 -  Intact skin with non-blanchable redness of a localized area usually over a bony prominence.  Wound Description (Comments):   Present on Admission:     Consultants:  Orthopedics  Procedures:  None  Antimicrobials:  Anti-infectives (From admission, onward)    Start     Dose/Rate Route Frequency Ordered Stop   04/02/22 1200  linezolid (ZYVOX) IVPB 600 mg  Status:  Discontinued        600 mg 300 mL/hr over 60 Minutes Intravenous Every 12 hours 04/02/22 0336 04/02/22 1125   04/02/22 1200  ceFEPIme (MAXIPIME) 2 g in sodium chloride 0.9 % 100 mL IVPB  Status:  Discontinued        2 g 200 mL/hr over 30 Minutes Intravenous Every 12 hours 04/02/22 0352 04/02/22 1125   04/02/22 0800  ceFEPIme (MAXIPIME) 2 g in sodium chloride 0.9 % 100 mL IVPB  Status:  Discontinued        2 g 200 mL/hr over 30 Minutes Intravenous Every 8 hours 04/02/22 0335 04/02/22 0352   04/02/22 0345  metroNIDAZOLE (FLAGYL) IVPB 500 mg  Status:  Discontinued         500 mg 100 mL/hr over 60 Minutes Intravenous Every 12 hours 04/02/22 0335 04/02/22 1125   04/01/22 2345  linezolid (ZYVOX) IVPB 600 mg        600 mg 300 mL/hr over 60 Minutes Intravenous  Once 04/01/22 2338 04/02/22 0118   04/01/22 2330  linezolid (ZYVOX) IVPB 600 mg  Status:  Discontinued        600 mg 300 mL/hr over 60 Minutes Intravenous Every 12 hours 04/01/22 2324 04/01/22 2338   04/01/22 2330  ceFEPIme (MAXIPIME) 2 g in sodium chloride 0.9 % 100 mL IVPB        2  g 200 mL/hr over 30 Minutes Intravenous  Once 04/01/22 2325 04/02/22 0041         Subjective:  Patient seen and examined.  She has no complaints.  Sister at the bedside.  Objective: Vitals:   04/05/22 2121 04/06/22 0008 04/06/22 0520 04/06/22 0832  BP: (!) 89/50 (!) 95/46 (!) 98/45 (!) 93/39  Pulse: 86 83 81 85  Resp: 16 18 16 18  $ Temp: 98.5 F (36.9 C) 98.2 F (36.8 C) 98.9 F (37.2 C) 98.2 F (36.8 C)  TempSrc: Oral Oral Oral   SpO2: 97% 96% 94% 97%  Weight:      Height:        Intake/Output Summary (Last 24 hours) at 04/06/2022 0928 Last data filed at 04/06/2022 0706 Guin per 24 hour  Intake 240 ml  Output 6600 ml  Net -6360 ml    Filed Weights   04/01/22 2140  Weight: 106 kg    Examination:  General exam: Appears calm and comfortable  Respiratory system: Clear to auscultation. Respiratory effort normal. Cardiovascular system: S1 & S2 heard, RRR. No JVD, murmurs, rubs, gallops or clicks.  +2 pitting edema bilateral lower extremity and +1 pitting edema bilateral upper extremities. Gastrointestinal system: Abdomen is nondistended, soft and nontender. No organomegaly or masses felt. Normal bowel sounds heard. Central nervous system: Alert and oriented. No focal neurological deficits. Extremities: Symmetric 5 x 5 power. Skin: No rashes, lesions or ulcers.    Data Reviewed: I have personally reviewed following labs and imaging studies  CBC: Recent Labs  Lab 04/01/22 2210 04/02/22 0834  04/03/22 0330 04/03/22 1945 04/04/22 0357 04/04/22 2011 04/05/22 0613 04/05/22 1704 04/06/22 0611  WBC 19.4*   < > 7.7  --  6.6  --  9.2 9.5 7.3  NEUTROABS 16.4*  --  6.1  --  4.7  --  7.6  --   --   HGB 10.6*   < > 6.3*   < > 6.9* 8.7* 7.4* 8.3* 7.4*  HCT 33.1*   < > 19.1*   < > 20.9* 25.2* 21.3* 24.7* 22.0*  MCV 89.9   < > 89.3  --  88.9  --  85.5 88.2 88.4  PLT 259   < > 119*  --  122*  --  150 179 178   < > = values in this interval not displayed.    Basic Metabolic Panel: Recent Labs  Lab 04/01/22 2210 04/02/22 0610 04/02/22 0834 04/03/22 0330 04/03/22 0841 04/03/22 1308 04/04/22 0357 04/04/22 0821 04/04/22 2011 04/05/22 0613 04/05/22 1110 04/05/22 1927 04/06/22 0611  NA 124* 127*  --  126*  --    < > 118*   < > 118* 120* 123* 128* 132*  K 3.2* 3.4*  --  4.3  --   --  3.9  --   --  4.3  --   --  4.3  CL 89* 100  --  101  --   --  92*  --   --  91*  --   --  102  CO2 20* 22  --  20*  --   --  20*  --   --  23  --   --  25  GLUCOSE 193* 132*  --  106*  --   --  94  --   --  131*  --   --  116*  BUN 24* 18  --  13  --   --  11  --   --  9  --   --  12  CREATININE 1.23* 0.88  --  0.94  --   --  0.68  --   --  0.74  --   --  0.79  CALCIUM 8.2* 7.5*  --  7.5*  --   --  7.0*  --   --  7.5*  --   --  7.7*  MG 1.6*  --  1.6*  --  2.0  --   --   --   --   --   --   --   --    < > = values in this interval not displayed.    GFR: Estimated Creatinine Clearance: 77.7 mL/min (by C-G formula based on SCr of 0.79 mg/dL). Liver Function Tests: Recent Labs  Lab 04/01/22 2210  AST 35  ALT 19  ALKPHOS 55  BILITOT 0.6  PROT 5.4*  ALBUMIN 3.1*    No results for input(s): "LIPASE", "AMYLASE" in the last 168 hours. No results for input(s): "AMMONIA" in the last 168 hours. Coagulation Profile: Recent Labs  Lab 04/01/22 2340 04/02/22 0610  INR 1.0 1.1    Cardiac Enzymes: No results for input(s): "CKTOTAL", "CKMB", "CKMBINDEX", "TROPONINI" in the last 168 hours. BNP  (last 3 results) No results for input(s): "PROBNP" in the last 8760 hours. HbA1C: No results for input(s): "HGBA1C" in the last 72 hours. CBG: No results for input(s): "GLUCAP" in the last 168 hours. Lipid Profile: No results for input(s): "CHOL", "HDL", "LDLCALC", "TRIG", "CHOLHDL", "LDLDIRECT" in the last 72 hours. Thyroid Function Tests: No results for input(s): "TSH", "T4TOTAL", "FREET4", "T3FREE", "THYROIDAB" in the last 72 hours.  Anemia Panel: No results for input(s): "VITAMINB12", "FOLATE", "FERRITIN", "TIBC", "IRON", "RETICCTPCT" in the last 72 hours. Sepsis Labs: Recent Labs  Lab 04/01/22 2139 04/01/22 2210 04/02/22 0120 04/02/22 0610 04/02/22 0834  PROCALCITON  --  <0.10  --  <0.10  --   LATICACIDVEN 5.2*  --  2.3*  --  1.0     Recent Results (from the past 240 hour(s))  Blood Culture (routine x 2)     Status: None   Collection Time: 04/01/22 11:30 PM   Specimen: BLOOD  Result Value Ref Range Status   Specimen Description BLOOD  LEFT ARM  Final   Special Requests   Final    BOTTLES DRAWN AEROBIC AND ANAEROBIC Blood Culture results may not be optimal due to an inadequate volume of blood received in culture bottles   Culture   Final    NO GROWTH 5 DAYS Performed at [facility], [address], [place], [place] [address]    Report Status 04/06/2022 FINAL  Final  Blood Culture (routine x 2)     Status: None   Collection Time: 04/01/22 11:40 PM   Specimen: BLOOD  Result Value Ref Range Status   Specimen Description BLOOD  RIGHT ARM  Final   Special Requests   Final    BOTTLES DRAWN AEROBIC AND ANAEROBIC Blood Culture adequate volume   Culture   Final    NO GROWTH 5 DAYS Performed at [facility], [address] [place],  [address]    Report Status 04/06/2022 FINAL  Final  Resp panel by RT-PCR (RSV, Flu A&B, Covid) Anterior Nasal Swab     Status: None   Collection Time: 04/01/22 11:44 PM   Specimen: Anterior Nasal Swab  Result  Value Ref Range Status   SARS Coronavirus 2 by RT PCR NEGATIVE NEGATIVE Final  Comment: (NOTE) SARS-CoV-2 target nucleic acids are NOT DETECTED.  The SARS-CoV-2 RNA is generally detectable in upper respiratory specimens during the acute phase of infection. The lowest concentration of SARS-CoV-2 viral copies this assay can detect is 138 copies/mL. A negative result does not preclude SARS-Cov-2 infection and should not be used as the sole basis for treatment or other patient management decisions. A negative result may occur with  improper specimen collection/handling, submission of specimen other than nasopharyngeal swab, presence of viral mutation(s) within the areas targeted by this assay, and inadequate number of viral copies(<138 copies/mL). A negative result must be combined with clinical observations, patient history, and epidemiological information. The expected result is Negative.  Fact Sheet for Patients:  EntrepreneurPulse.com.au  Fact Sheet for Healthcare Providers:  IncredibleEmployment.be  This test is no t yet approved or cleared by the [place] FDA and  has been authorized for detection and/or diagnosis of SARS-CoV-2 by FDA under an Emergency Use Authorization (EUA). This EUA will remain  in effect (meaning this test can be used) for the duration of the COVID-19 declaration under Section 564(b)(1) of the Act, 21 U.S.C.section 360bbb-3(b)(1), unless the authorization is terminated  or revoked sooner.       Influenza A by PCR NEGATIVE NEGATIVE Final   Influenza B by PCR NEGATIVE NEGATIVE Final    Comment: (NOTE) The Xpert Xpress SARS-CoV-2/FLU/RSV plus assay is intended as an aid in the diagnosis of influenza from Nasopharyngeal swab specimens and should not be used as a sole basis for treatment. Nasal washings and aspirates are unacceptable for Xpert Xpress SARS-CoV-2/FLU/RSV testing.  Fact Sheet for  Patients: EntrepreneurPulse.com.au  Fact Sheet for Healthcare Providers: IncredibleEmployment.be  This test is not yet approved or cleared by the [place] FDA and has been authorized for detection and/or diagnosis of SARS-CoV-2 by FDA under an Emergency Use Authorization (EUA). This EUA will remain in effect (meaning this test can be used) for the duration of the COVID-19 declaration under Section 564(b)(1) of the Act, 21 U.S.C. section 360bbb-3(b)(1), unless the authorization is terminated or revoked.     Resp Syncytial Virus by PCR NEGATIVE NEGATIVE Final    Comment: (NOTE) Fact Sheet for Patients: EntrepreneurPulse.com.au  Fact Sheet for Healthcare Providers: IncredibleEmployment.be  This test is not yet approved or cleared by the [place] FDA and has been authorized for detection and/or diagnosis of SARS-CoV-2 by FDA under an Emergency Use Authorization (EUA). This EUA will remain in effect (meaning this test can be used) for the duration of the COVID-19 declaration under Section 564(b)(1) of the Act, 21 U.S.C. section 360bbb-3(b)(1), unless the authorization is terminated or revoked.  Performed at [facility], [address] [place], [place] [address]   Urine Culture (for pregnant, neutropenic or urologic patients or patients with an indwelling urinary catheter)     Status: None   Collection Time: 04/02/22  3:15 AM   Specimen: Urine, Clean Catch  Result Value Ref Range Status   Specimen Description   Final    URINE, CLEAN CATCH Performed at [facility], [address], [place], [place] [address]    Special Requests   Final    NONE Performed at [facility], [address], [place], [place] [address]    Culture   Final    NO GROWTH Performed at [facility], [place] [address], [place],  [address]    Report Status 04/03/2022 FINAL  Final      Radiology Studies: No  results found.  Scheduled Meds:  sodium chloride   Intravenous Once   acidophilus  1 capsule Oral BID   amiodarone  200 mg Oral Daily   vitamin C  1,000 mg Oral QHS   B-complex with vitamin C  1 tablet Oral QPM   calcium-vitamin D   Oral Daily   diltiazem  180 mg Oral Daily   DULoxetine  30 mg Oral Daily   enoxaparin (LOVENOX) injection  0.5 mg/kg Subcutaneous Q24H   fesoterodine  4 mg Oral Daily   folic acid  1 mg Oral Daily   levothyroxine  75 mcg Oral Q0600   pantoprazole  40 mg Oral Daily   pyridostigmine  60 mg Oral TID   risperiDONE  1 mg Oral QHS   sodium chloride  1 g Oral BID WC   vitamin E  400 Units Oral Q1400   Continuous Infusions:     LOS: 4 days   Darliss Cheney, MD [facility]  04/06/2022, 9:28 AM   *Please note that this is a verbal dictation therefore any spelling or grammatical errors are due to the "[place]" system interpretation.  Please page via [place] and do not message via secure chat for urgent patient care matters. Secure chat can be used for non urgent patient care matters.  How to contact the [facility] Attending or Consulting provider [place] or covering provider during after hours [place], for this patient?  Check the care team in [facility] and look for a) attending/consulting [facility] provider listed and b) the [facility] team listed. Page or secure chat [phone]. Log into [email or web address] and use [place]'s universal password to access. If you do not have the password, please contact the hospital operator. Locate the [facility] provider you are looking for under [facility] and page to a number that you can be directly reached. If you still have difficulty reaching the provider, please page the [facility] (Director on Call) for the Hospitalists listed on amion for assistance.

## 2022-04-06 NOTE — Progress Notes (Signed)
Patient not able to perform effectively at this time.

## 2022-04-06 NOTE — TOC Progression Note (Signed)
Transition of Care [facility]) - Progression Note    Patient Details  Name: Madison Burns MRN: [id number] Date of Birth: 10/29/1950  Transition of Care [facility]) CM/SW Contact  Izola Price, RN Phone Number: 04/06/2022, 2:50 PM  Clinical Narrative:  2/18: Left a VM with call back number for patient sister to inform of bed offers so far. They wanted local preferences and only local to accept so far is [facility], with others pending or considering and [facility] declines due to no beds. 250 pm. Simmie Davies RN CM        Barriers to Discharge: Continued Medical Work up  Expected Discharge Plan and Services                                               Social Determinants of Health (SDOH) Interventions SDOH Screenings   Depression (PHQ2-9): Low Risk  (11/11/2021)  Tobacco Use: Low Risk  (04/02/2022)    Readmission Risk Interventions    04/09/2020    1:25 PM  Readmission Risk Prevention Plan  Transportation Screening Complete  PCP or Specialist Appt within 3-5 Days Complete  HRI or [place] Complete  Social Work Consult for [place] Planning/Counseling Complete  Palliative Care Screening Not Applicable  Medication Review Press photographer) Complete

## 2022-04-07 ENCOUNTER — Inpatient Hospital Stay: Payer: 59

## 2022-04-07 DIAGNOSIS — A419 Sepsis, unspecified organism: Secondary | ICD-10-CM | POA: Diagnosis not present

## 2022-04-07 LAB — CBC
HCT: 20.9 % — ABNORMAL LOW (ref 36.0–46.0)
Hemoglobin: 6.6 g/dL — ABNORMAL LOW (ref 12.0–15.0)
MCH: 29.2 pg (ref 26.0–34.0)
MCHC: 31.6 g/dL (ref 30.0–36.0)
MCV: 92.5 fL (ref 80.0–100.0)
Platelets: 216 10*3/uL (ref 150–400)
RBC: 2.26 MIL/uL — ABNORMAL LOW (ref 3.87–5.11)
RDW: 17.4 % — ABNORMAL HIGH (ref 11.5–15.5)
WBC: 7.2 10*3/uL (ref 4.0–10.5)
nRBC: 0 % (ref 0.0–0.2)

## 2022-04-07 LAB — PREPARE RBC (CROSSMATCH)

## 2022-04-07 LAB — HEMOGLOBIN AND HEMATOCRIT, BLOOD
HCT: 28.4 % — ABNORMAL LOW (ref 36.0–46.0)
Hemoglobin: 8.9 g/dL — ABNORMAL LOW (ref 12.0–15.0)

## 2022-04-07 LAB — BASIC METABOLIC PANEL
Anion gap: 8 (ref 5–15)
BUN: 17 mg/dL (ref 8–23)
CO2: 25 mmol/L (ref 22–32)
Calcium: 7.4 mg/dL — ABNORMAL LOW (ref 8.9–10.3)
Chloride: 100 mmol/L (ref 98–111)
Creatinine, Ser: 0.88 mg/dL (ref 0.44–1.00)
GFR, Estimated: >60 mL/min (ref >60)
Glucose, Bld: 99 mg/dL (ref 70–99)
Potassium: 4.3 mmol/L (ref 3.5–5.1)
Sodium: 133 mmol/L — ABNORMAL LOW (ref 135–145)

## 2022-04-07 MED ORDER — SODIUM CHLORIDE 0.9% IV SOLUTION: Freq: Once | INTRAVENOUS | Status: AC

## 2022-04-07 NOTE — Progress Notes (Signed)
PT Cancellation Note  Patient Details Name: Madison Burns MRN: [id number] DOB: 1950-04-11   Cancelled Treatment:     PT hold. Pt has drop in Hgb to 6.6. CT ordered to re-evaluate hematoma. Will hold PT until post transfusion.    Willette Pa 04/07/2022, 10:02 AM

## 2022-04-07 NOTE — Progress Notes (Signed)
PROGRESS NOTE    Madison Burns  [id number] DOB: 04/07/1950 DOA: 04/01/2022 PCP: Einar Pheasant, MD   Brief Narrative:  Madison Burns is a 71 y.o. Caucasian female with medical history significant for myasthenia gravis, GERD, hypertension, dyslipidemia, hypothyroidism, IBS, OSA on CPAP, who presented to the emergency room with a [name] of recurrent falls.  She had a couple falls with subsequent head injury and facial contusion mainly in the nose.  She stated that she tripped and fell.  No presyncope or syncope.  No headache or dizziness or blurred vision.  No paresthesias or focal muscle weakness.  No weakness seizures.  She had subsequent left hand swelling and pain and was found to have fifth metacarpal fracture.  No dysuria, oliguria or hematuria, urgency or frequency or flank pain.   ED Course: When she came to the ER, BP was 98/52 and temperature 97.4 with otherwise normal vital signs.  BP later on was 113/65.  She did not require any oxygen.  ABG was unremarkable.  CMP was remarkable for hyponatremia and hypochloremia, hypokalemia and a BUN of 24 with creatinine 1.23, magnesium 1.6 and high sensitive troponin I was 8.  Lactic acid was 5.2 and later 2.3 and procalcitonin less than 0.1.  CBC showed leukocytosis 19.4 with neutrophilia, anemia that is new.  Coag profile was within normal.  TSH was 4.34 and free T41.29. EKG as reviewed by me : Normal sinus rhythm with a rate of 78 with suspected left atrial enlargement, LVH and prolonged QT interval. Imaging: Portable chest x-ray showed no acute cardiopulmonary disease.  It showed mild right perihilar distortion that is stable.  Left hand x-ray showed acute to subacute nondisplaced fracture involving the proximal shaft of the fifth metacarpal.  Abdominal and pelvic CT scan revealed the following: 1. 12.7 cm intramuscular and subcutaneous hematoma within the right gluteal region. Extensive overlying subcutaneous edema. 2. Stable post  treatment change within the anterior mediastinum. 3. Stable post ablated changes within the lower pole of the left kidney. 4. Moderate sigmoid diverticulosis without superimposed acute inflammatory change.  Assessment & Plan:   Principal Problem:   Sepsis due to undetermined organism [facility]) Active Problems:   Frequent falls   Nondisplaced fracture of shaft of fifth metacarpal bone, left hand, initial encounter for closed fracture   Myasthenia gravis ([place])   Hypothyroidism   Paroxysmal atrial fibrillation (HCC)   GERD without esophagitis  Sepsis due to undetermined organism ([place]), ruled out - Upon presentation, she had mild tachypnea and significant leukocytosis and significantly elevated lactic acid level.  However patient is afebrile and her procalcitonin is unremarkable as well.  Her presentation could very well be just because of the dehydration.  I do not think there is any evidence of infection or sepsis, sepsis ruled out.  Antibiotics discontinued on the morning of 04/02/2022.  Patient has remained stable, afebrile.  No leukocytosis.   Frequent falls/generalized weakness: Presumably this was secondary to hyponatremia. She is evaluated by PT and OT and they have recommended SNF.  Sister does not like the idea of breath she is okay if that is needed.   Nondisplaced fracture of shaft of fifth metacarpal bone, left hand, initial encounter for closed fracture - The patient had her left hand splinted.  Orthopedics consulted, they recommended continuing current management and follow-up outpatient in 2 weeks.   GERD without esophagitis - We will continue PPI therapy.   Paroxysmal atrial fibrillation (HCC) - We will continue amiodarone. - The patient has  IVC and high fall risk and therefore is not on any anticoagulations.   Hypothyroidism - We will continue Synthroid.   Myasthenia gravis ([place]) - We will continue her pyridostigmine.  Acute blood loss anemia: No history of melena or  GI bleed.  Hemoglobin dropped to 6.3 on 04/03/2022, likely due to large subcutaneous hematoma within the gluteal region.  She was given 1 unit of PRBC transfusion.  Hemoglobin improved but dropped to 6.9 again on 04/04/2022 and she was given 1 unit of PRBC transfusion, hemoglobin improved to 8.3 but then slowly dropped to 6.6 again today on 04/07/2022.  Will transfuse 1 more unit.  FOBT has been ordered twice but has not been collected yet.  Per patient and her sister, patient had 1 bowel movement on Friday which was regular color, it was not black or bloody.  Based on that information, I doubt that this is a GI bleed.  I wonder if her gluteal hematoma is increasing in size and she is still bleeding from there so I will repeat CT pelvis.  Monitor H&H every 12 hours.  Intramuscular and subcutaneous hematoma within the right gluteal region: 12.7 cm in size.  Has edema but no erythema.  Repeating CT pelvis.  Hypokalemia: Resolved  Acute hyponatremia: Presented with 124 which further dropped to 118.  Workup indicates SIADH.  I noticed that patient is on desmopressin.  Discussed with the sister, she tells me that she was started recently, few months ago on this medication for excessive urination.  I have desmopressin on the night of 04/04/2022, patient's sodium has improved from 118 at that point in time to 133 today.  I have advised the family and the patient not to resume this medication.  Dehydration/lactic acidosis: Lactic acidosis resolved, she appears to be better hydrated now.  IV fluids discontinued.  Hypotension: Patient is having hypotension but she is asymptomatic and lactic acid is normal.  She is not on any antihypertensives.  She is on diltiazem and amiodarone for atrial fibrillation.  DVT prophylaxis: Place and maintain sequential compression device Start: 04/02/22 1129on SCDs due to large gluteal hematoma.   Code Status: DNR  Family Communication: Sister present at bedside.  Plan of care  discussed with patient in length and he/she verbalized understanding and agreed with it.  Status is: Inpatient Remains inpatient appropriate because: monitoring of recurrent anemia.  Also hypotensive today.   Estimated body mass index is 40.11 kg/m as calculated from the following:   Height as of this encounter: 5' 4"$  (1.626 m).   Weight as of this encounter: 106 kg.  Pressure Injury 10/23/19 Nose Stage 1 -  Intact skin with non-blanchable redness of a localized area usually over a bony prominence. (Active)  10/23/19 1110  Location: Nose  Location Orientation:   Staging: Stage 1 -  Intact skin with non-blanchable redness of a localized area usually over a bony prominence.  Wound Description (Comments):   Present on Admission:      Pressure Injury Buttocks Left Stage 2 -  Partial thickness loss of dermis presenting as a shallow open injury with a red, pink wound bed without slough. (Active)     Location: Buttocks  Location Orientation: Left  Staging: Stage 2 -  Partial thickness loss of dermis presenting as a shallow open injury with a red, pink wound bed without slough.  Wound Description (Comments):   Present on Admission: Yes (per pt sister from previous biopsy)     Pressure Injury 04/10/20 Buttocks Mid Stage  1 -  Intact skin with non-blanchable redness of a localized area usually over a bony prominence. (Active)  04/10/20 0937  Location: Buttocks  Location Orientation: Mid  Staging: Stage 1 -  Intact skin with non-blanchable redness of a localized area usually over a bony prominence.  Wound Description (Comments):   Present on Admission:    Nutritional Assessment: Body mass index is 40.11 kg/m.[name] Seen by dietician.  I agree with the assessment and plan as outlined below: Nutrition Status:        . Skin Assessment: I have examined the patient's skin and I agree with the wound assessment as performed by the wound care RN as outlined below: Pressure Injury 10/23/19 Nose  Stage 1 -  Intact skin with non-blanchable redness of a localized area usually over a bony prominence. (Active)  10/23/19 1110  Location: Nose  Location Orientation:   Staging: Stage 1 -  Intact skin with non-blanchable redness of a localized area usually over a bony prominence.  Wound Description (Comments):   Present on Admission:      Pressure Injury Buttocks Left Stage 2 -  Partial thickness loss of dermis presenting as a shallow open injury with a red, pink wound bed without slough. (Active)     Location: Buttocks  Location Orientation: Left  Staging: Stage 2 -  Partial thickness loss of dermis presenting as a shallow open injury with a red, pink wound bed without slough.  Wound Description (Comments):   Present on Admission: Yes (per pt sister from previous biopsy)     Pressure Injury 04/10/20 Buttocks Mid Stage 1 -  Intact skin with non-blanchable redness of a localized area usually over a bony prominence. (Active)  04/10/20 0937  Location: Buttocks  Location Orientation: Mid  Staging: Stage 1 -  Intact skin with non-blanchable redness of a localized area usually over a bony prominence.  Wound Description (Comments):   Present on Admission:     Consultants:  Orthopedics  Procedures:  None  Antimicrobials:  Anti-infectives (From admission, onward)    Start     Dose/Rate Route Frequency Ordered Stop   04/02/22 1200  linezolid (ZYVOX) IVPB 600 mg  Status:  Discontinued        600 mg 300 mL/hr over 60 Minutes Intravenous Every 12 hours 04/02/22 0336 04/02/22 1125   04/02/22 1200  ceFEPIme (MAXIPIME) 2 g in sodium chloride 0.9 % 100 mL IVPB  Status:  Discontinued        2 g 200 mL/hr over 30 Minutes Intravenous Every 12 hours 04/02/22 0352 04/02/22 1125   04/02/22 0800  ceFEPIme (MAXIPIME) 2 g in sodium chloride 0.9 % 100 mL IVPB  Status:  Discontinued        2 g 200 mL/hr over 30 Minutes Intravenous Every 8 hours 04/02/22 0335 04/02/22 0352   04/02/22 0345   metroNIDAZOLE (FLAGYL) IVPB 500 mg  Status:  Discontinued        500 mg 100 mL/hr over 60 Minutes Intravenous Every 12 hours 04/02/22 0335 04/02/22 1125   04/01/22 2345  linezolid (ZYVOX) IVPB 600 mg        600 mg 300 mL/hr over 60 Minutes Intravenous  Once 04/01/22 2338 04/02/22 0118   04/01/22 2330  linezolid (ZYVOX) IVPB 600 mg  Status:  Discontinued        600 mg 300 mL/hr over 60 Minutes Intravenous Every 12 hours 04/01/22 2324 04/01/22 2338   04/01/22 2330  ceFEPIme (MAXIPIME) 2 g in sodium chloride  0.9 % 100 mL IVPB        2 g 200 mL/hr over 30 Minutes Intravenous  Once 04/01/22 2325 04/02/22 0041         Subjective:  Patient seen and examined.  She has no complaints.  Objective: Vitals:   04/06/22 1644 04/06/22 2037 04/07/22 0520 04/07/22 0840  BP: (!) 106/49 (!) 100/48 (!) 94/42 (!) 103/53  Pulse: 84 89 76 75  Resp: 18 18 18 17  $ Temp: 98.6 F (37 C) 100.2 F (37.9 C) 98 F (36.7 C) (!) 97.5 F (36.4 C)  TempSrc:  Oral Oral   SpO2: 95% 99% 97% 97%  Weight:      Height:        Intake/Output Summary (Last 24 hours) at 04/07/2022 0859 Last data filed at 04/07/2022 [id number] [place] per 24 hour  Intake --  Output 600 ml  Net -600 ml    Filed Weights   04/01/22 2140  Weight: 106 kg    Examination:  General exam: Appears calm and comfortable  Respiratory system: Clear to auscultation. Respiratory effort normal. Cardiovascular system: S1 & S2 heard, RRR. No JVD, murmurs, rubs, gallops or clicks.  +2 pitting edema bilateral lower extremity and +1 pitting edema bilateral upper extremities. Gastrointestinal system: Abdomen is nondistended, soft and nontender. No organomegaly or masses felt. Normal bowel sounds heard. Central nervous system: Alert and oriented. No focal neurological deficits. Extremities: Symmetric 5 x 5 power. Skin: No rashes, lesions or ulcers.   Data Reviewed: I have personally reviewed following labs and imaging studies  CBC: Recent Labs  Lab  04/01/22 2210 04/02/22 0834 04/03/22 0330 04/03/22 1945 04/04/22 0357 04/04/22 2011 04/05/22 0613 04/05/22 1704 04/06/22 0611 04/06/22 1651 04/07/22 0505  WBC 19.4*   < > 7.7  --  6.6  --  9.2 9.5 7.3 7.3 7.2  NEUTROABS 16.4*  --  6.1  --  4.7  --  7.6  --   --   --   --   HGB 10.6*   < > 6.3*   < > 6.9*   < > 7.4* 8.3* 7.4* 7.3* 6.6*  HCT 33.1*   < > 19.1*   < > 20.9*   < > 21.3* 24.7* 22.0* 22.4* 20.9*  MCV 89.9   < > 89.3  --  88.9  --  85.5 88.2 88.4 91.4 92.5  PLT 259   < > 119*  --  122*  --  150 179 178 206 216   < > = values in this interval not displayed.    Basic Metabolic Panel: Recent Labs  Lab 04/01/22 2210 04/02/22 0610 04/02/22 0834 04/03/22 0330 04/03/22 0841 04/03/22 1308 04/04/22 0357 04/04/22 0821 04/05/22 0613 04/05/22 1110 04/05/22 1927 04/06/22 0611 04/06/22 1651 04/07/22 0505  NA 124*   < >  --  126*  --    < > 118*   < > 120* 123* 128* 132* 131* 133*  K 3.2*   < >  --  4.3  --   --  3.9  --  4.3  --   --  4.3  --  4.3  CL 89*   < >  --  101  --   --  92*  --  91*  --   --  102  --  100  CO2 20*   < >  --  20*  --   --  20*  --  23  --   --  25  --  25  GLUCOSE 193*   < >  --  106*  --   --  94  --  131*  --   --  116*  --  99  BUN 24*   < >  --  13  --   --  11  --  9  --   --  12  --  17  CREATININE 1.23*   < >  --  0.94  --   --  0.68  --  0.74  --   --  0.79  --  0.88  CALCIUM 8.2*   < >  --  7.5*  --   --  7.0*  --  7.5*  --   --  7.7*  --  7.4*  MG 1.6*  --  1.6*  --  2.0  --   --   --   --   --   --   --   --   --    < > = values in this interval not displayed.    GFR: Estimated Creatinine Clearance: 70.6 mL/min (by C-G formula based on SCr of 0.88 mg/dL). Liver Function Tests: Recent Labs  Lab 04/01/22 2210  AST 35  ALT 19  ALKPHOS 55  BILITOT 0.6  PROT 5.4*  ALBUMIN 3.1*    No results for input(s): "LIPASE", "AMYLASE" in the last 168 hours. No results for input(s): "AMMONIA" in the last 168 hours. Coagulation  Profile: Recent Labs  Lab 04/01/22 2340 04/02/22 0610  INR 1.0 1.1    Cardiac Enzymes: No results for input(s): "CKTOTAL", "CKMB", "CKMBINDEX", "TROPONINI" in the last 168 hours. BNP (last 3 results) No results for input(s): "PROBNP" in the last 8760 hours. HbA1C: No results for input(s): "HGBA1C" in the last 72 hours. CBG: No results for input(s): "GLUCAP" in the last 168 hours. Lipid Profile: No results for input(s): "CHOL", "HDL", "LDLCALC", "TRIG", "CHOLHDL", "LDLDIRECT" in the last 72 hours. Thyroid Function Tests: No results for input(s): "TSH", "T4TOTAL", "FREET4", "T3FREE", "THYROIDAB" in the last 72 hours.  Anemia Panel: No results for input(s): "VITAMINB12", "FOLATE", "FERRITIN", "TIBC", "IRON", "RETICCTPCT" in the last 72 hours. Sepsis Labs: Recent Labs  Lab 04/01/22 2139 04/01/22 2210 04/02/22 0120 04/02/22 0610 04/02/22 0834 04/06/22 0932  PROCALCITON  --  <0.10  --  <0.10  --   --   LATICACIDVEN 5.2*  --  2.3*  --  1.0 1.0     Recent Results (from the past 240 hour(s))  Blood Culture (routine x 2)     Status: None   Collection Time: 04/01/22 11:30 PM   Specimen: BLOOD  Result Value Ref Range Status   Specimen Description BLOOD  LEFT ARM  Final   Special Requests   Final    BOTTLES DRAWN AEROBIC AND ANAEROBIC Blood Culture results may not be optimal due to an inadequate volume of blood received in culture bottles   Culture   Final    NO GROWTH 5 DAYS Performed at [facility], [address], [place], [place] [address]    Report Status 04/06/2022 FINAL  Final  Blood Culture (routine x 2)     Status: None   Collection Time: 04/01/22 11:40 PM   Specimen: BLOOD  Result Value Ref Range Status   Specimen Description BLOOD  RIGHT ARM  Final   Special Requests   Final    BOTTLES DRAWN AEROBIC AND ANAEROBIC Blood Culture adequate volume   Culture  Final    NO GROWTH 5 DAYS Performed at [facility], [address] [place],  [place] [address]    Report Status 04/06/2022 FINAL  Final  Resp panel by RT-PCR (RSV, Flu A&B, Covid) Anterior Nasal Swab     Status: None   Collection Time: 04/01/22 11:44 PM   Specimen: Anterior Nasal Swab  Result Value Ref Range Status   SARS Coronavirus 2 by RT PCR NEGATIVE NEGATIVE Final    Comment: (NOTE) SARS-CoV-2 target nucleic acids are NOT DETECTED.  The SARS-CoV-2 RNA is generally detectable in upper respiratory specimens during the acute phase of infection. The lowest concentration of SARS-CoV-2 viral copies this assay can detect is 138 copies/mL. A negative result does not preclude SARS-Cov-2 infection and should not be used as the sole basis for treatment or other patient management decisions. A negative result may occur with  improper specimen collection/handling, submission of specimen other than nasopharyngeal swab, presence of viral mutation(s) within the areas targeted by this assay, and inadequate number of viral copies(<138 copies/mL). A negative result must be combined with clinical observations, patient history, and epidemiological information. The expected result is Negative.  Fact Sheet for Patients:  EntrepreneurPulse.com.au  Fact Sheet for Healthcare Providers:  IncredibleEmployment.be  This test is no t yet approved or cleared by the [place] FDA and  has been authorized for detection and/or diagnosis of SARS-CoV-2 by FDA under an Emergency Use Authorization (EUA). This EUA will remain  in effect (meaning this test can be used) for the duration of the COVID-19 declaration under Section 564(b)(1) of the Act, 21 U.S.C.section 360bbb-3(b)(1), unless the authorization is terminated  or revoked sooner.       Influenza A by PCR NEGATIVE NEGATIVE Final   Influenza B by PCR NEGATIVE NEGATIVE Final    Comment: (NOTE) The Xpert Xpress SARS-CoV-2/FLU/RSV plus assay is intended as an aid in the diagnosis of influenza from  Nasopharyngeal swab specimens and should not be used as a sole basis for treatment. Nasal washings and aspirates are unacceptable for Xpert Xpress SARS-CoV-2/FLU/RSV testing.  Fact Sheet for Patients: EntrepreneurPulse.com.au  Fact Sheet for Healthcare Providers: IncredibleEmployment.be  This test is not yet approved or cleared by the [place] FDA and has been authorized for detection and/or diagnosis of SARS-CoV-2 by FDA under an Emergency Use Authorization (EUA). This EUA will remain in effect (meaning this test can be used) for the duration of the COVID-19 declaration under Section 564(b)(1) of the Act, 21 U.S.C. section 360bbb-3(b)(1), unless the authorization is terminated or revoked.     Resp Syncytial Virus by PCR NEGATIVE NEGATIVE Final    Comment: (NOTE) Fact Sheet for Patients: EntrepreneurPulse.com.au  Fact Sheet for Healthcare Providers: IncredibleEmployment.be  This test is not yet approved or cleared by the [place] FDA and has been authorized for detection and/or diagnosis of SARS-CoV-2 by FDA under an Emergency Use Authorization (EUA). This EUA will remain in effect (meaning this test can be used) for the duration of the COVID-19 declaration under Section 564(b)(1) of the Act, 21 U.S.C. section 360bbb-3(b)(1), unless the authorization is terminated or revoked.  Performed at [facility], [address], [place], [place] [address]   Urine Culture (for pregnant, neutropenic or urologic patients or patients with an indwelling urinary catheter)     Status: None   Collection Time: 04/02/22  3:15 AM   Specimen: Urine, Clean Catch  Result Value Ref Range Status   Specimen Description   Final  URINE, CLEAN CATCH Performed at [facility], [address] [place], [place] [address]    Special Requests   Final    NONE Performed at [facility], [address] [place], [place] [address]    Culture   Final    NO GROWTH Performed at [facility], [place] [address], [place], [place] [address]    Report Status 04/03/2022 FINAL  Final     Radiology Studies: No results found.  Scheduled Meds:  sodium chloride   Intravenous Once   sodium chloride   Intravenous Once   acidophilus  1 capsule Oral BID   amiodarone  200 mg Oral Daily   vitamin C  1,000 mg Oral QHS   B-complex with vitamin C  1 tablet Oral QPM   calcium-vitamin D   Oral Daily   diltiazem  180 mg Oral Daily   DULoxetine  30 mg Oral Daily   enoxaparin (LOVENOX) injection  0.5 mg/kg Subcutaneous Q24H   fesoterodine  4 mg Oral Daily   folic acid  1 mg Oral Daily   levothyroxine  75 mcg Oral Q0600   pantoprazole  40 mg Oral Daily   pyridostigmine  60 mg Oral TID   risperiDONE  1 mg Oral QHS   sodium chloride  1 g Oral BID WC   vitamin E  400 Units Oral Q1400   Continuous Infusions:     LOS: 5 days   Darliss Cheney, MD [facility]  04/07/2022, 8:59 AM   *Please note that this is a verbal dictation therefore any spelling or grammatical errors are due to the "[place] One" system interpretation.  Please page via [place] and do not message via secure chat for urgent patient care matters. Secure chat can be used for non urgent patient care matters.  How to contact the [facility] Attending or Consulting provider [place] or covering provider during after hours [place], for this patient?  Check the care team in [facility] and look for a) attending/consulting [facility] provider listed and b) the [facility] team listed. Page or secure chat [phone]. Log into [email or web address] and use [place]'s universal password to access. If you do not have the password, please contact the hospital operator. Locate the [facility] provider you are looking for under [facility] and page to a number that you can be directly reached. If you still have difficulty reaching the provider, please page the [facility]  (Director on Call) for the Hospitalists listed on amion for assistance.

## 2022-04-07 NOTE — Progress Notes (Signed)
Patient unable to perform NIF appropriately.

## 2022-04-07 NOTE — Progress Notes (Signed)
Occupational Therapy Treatment Patient Details Name: Madison Burns MRN: [id number] DOB: 1950-06-01 Today's Date: 04/07/2022   History of present illness Madison Burns is a 71 y.o. Caucasian female with medical history significant for myasthenia gravis, GERD, hypertension, dyslipidemia, hypothyroidism, IBS, OSA on CPAP, who presented to the emergency room with a [name] of recurrent falls.  She had a couple falls with subsequent head injury and facial contusion mainly in the nose.  found to have fifth metacarpal fracture.   OT comments  Pt received supine in bed. Appearing alert; willing to work with OT on functional mobility. T/f MIN A to standing; use of L platform RW today; walked to RN station; remains x2 for safety (lines management, chair brought), but improved mobility today. See flowsheet below for further details of session. Left semi-reclined in bed, sister in room, bed alarm on, with all needs in reach.  Patient will benefit from continued OT while in acute care.    Recommendations for follow up therapy are one component of a multi-disciplinary discharge planning process, led by the attending physician.  Recommendations may be updated based on patient status, additional functional criteria and insurance authorization.    Follow Up Recommendations  Skilled nursing-short term rehab (<3 hours/day)     Assistance Recommended at Discharge Frequent or constant Supervision/Assistance  Patient can return home with the following  A lot of help with bathing/dressing/bathroom;Help with stairs or ramp for entrance;A little help with walking and/or transfers   Equipment Recommendations  Other (comment) (defer to next venue of care)    Recommendations for Other Services      Precautions / Restrictions Precautions Precautions: Fall Required Braces or Orthoses: Splint/Cast Splint/Cast: on L hand 2/2 5th metacarpal fx. Restrictions Weight Bearing Restrictions: No Other  Position/Activity Restrictions: No WB restrictions listed in orders; however, pt does have fx in L hand and spint wrapped.       Mobility Bed Mobility Overal bed mobility: Needs Assistance Bed Mobility: Supine to Sit     Supine to sit: Min assist, HOB elevated     General bed mobility comments: using rails then a hand for support; cues; HOB raised    Transfers Overall transfer level: Needs assistance Equipment used: Left platform walker Transfers: Sit to/from Stand Sit to Stand: Min assist           General transfer comment: cues for hand placement on LUE platform walker; cues for staying inside RW.     Balance           Standing balance support: Bilateral upper extremity supported Standing balance-Leahy Scale: Fair Standing balance comment: requires +1 for balance, gait belt, platform walker, chair follow for safety; assist for all lines/leads                           ADL either performed or assessed with clinical judgement   ADL                                              Extremity/Trunk Assessment Upper Extremity Assessment Upper Extremity Assessment: Generalized weakness;LUE deficits/detail LUE Deficits / Details: L hand in ulnar gutter splint   Lower Extremity Assessment Lower Extremity Assessment: Generalized weakness        Vision       Perception     Praxis  Cognition Arousal/Alertness: Awake/alert Behavior During Therapy: WFL for tasks assessed/performed Overall Cognitive Status: History of cognitive impairments - at baseline                                 General Comments: Sister in room; encouraging pt. Pt with emotional lability (crying) early in session, but recovered quickly and participatory; difficulty following instructions for keeping RW close to body during mobility.        Exercises      Shoulder Instructions       General Comments Pt getting blood via IV during  session, so care used to ensure integrity of IV line. Walked to [employer] station and back.    Pertinent Vitals/ Pain       Pain Assessment Pain Assessment: No/denies pain  Home Living                                          Prior Functioning/Environment              Frequency  Min 2X/week        Progress Toward Goals  OT Goals(current goals can now be found in the care plan section)  Progress towards OT goals: Progressing toward goals  Acute Rehab OT Goals Patient Stated Goal: go home OT Goal Formulation: With patient/family Time For Goal Achievement: 04/17/22 Potential to Achieve Goals: Good ADL Goals Pt Will Perform Grooming: with set-up;with supervision;standing Pt Will Perform Lower Body Dressing: with min assist;sitting/lateral leans Pt Will Transfer to Toilet: with min assist;ambulating;regular height toilet  Plan Discharge plan remains appropriate    Co-evaluation    PT/OT/SLP Co-Evaluation/Treatment: Yes Reason for Co-Treatment: For patient/therapist safety;To address functional/ADL transfers   OT goals addressed during session: Proper use of [employer] equipment and DME;Strengthening/ROM      AM-PAC OT "6 Clicks" Daily Activity     Outcome Measure   Help from another person eating meals?: None Help from another person taking care of personal grooming?: A Little Help from another person toileting, which includes using toliet, bedpan, or urinal?: Total Help from another person bathing (including washing, rinsing, drying)?: A Lot Help from another person to put on and taking off regular upper body clothing?: A Little Help from another person to put on and taking off regular lower body clothing?: Total 6 Click Score: 14    End of Session Equipment Utilized During Treatment: Other (comment);Gait belt (platform RW L)  OT Visit Diagnosis: Other abnormalities of gait and mobility (R26.89);Muscle weakness (generalized) (M62.81)   Activity  Tolerance Patient tolerated treatment well   Patient Left in bed;with call bell/phone within reach;with bed alarm set;with nursing/sitter in room   Nurse Communication Mobility status        Time: [id number] OT Time Calculation (min): 20 min  Charges: OT General Charges $OT Visit: 1 Visit OT Treatments $Therapeutic Activity: 8-22 mins  Waymon Amato, MS, OTR/L   Vania Rea 04/07/2022, 3:47 PM

## 2022-04-07 NOTE — Progress Notes (Signed)
NIF -30, best of 3 with good effort.

## 2022-04-08 DIAGNOSIS — D62 Acute posthemorrhagic anemia: Secondary | ICD-10-CM

## 2022-04-08 DIAGNOSIS — S62357A Nondisplaced fracture of shaft of fifth metacarpal bone, left hand, initial encounter for closed fracture: Secondary | ICD-10-CM

## 2022-04-08 DIAGNOSIS — G7000 Myasthenia gravis without (acute) exacerbation: Secondary | ICD-10-CM

## 2022-04-08 LAB — TYPE AND SCREEN
ABO/RH(D): O POS
Antibody Screen: NEGATIVE
Unit division: 0

## 2022-04-08 LAB — TECHNOLOGIST SMEAR REVIEW

## 2022-04-08 LAB — BPAM RBC
Blood Product Expiration Date: 202403192359
ISSUE DATE / TIME: 202402191306
Unit Type and Rh: 5100

## 2022-04-08 LAB — CBC WITH DIFFERENTIAL/PLATELET
Abs Immature Granulocytes: 0.1 10*3/uL — ABNORMAL HIGH (ref 0.00–0.07)
Basophils Absolute: 0 10*3/uL (ref 0.0–0.1)
Basophils Relative: 1 %
Eosinophils Absolute: 0.3 10*3/uL (ref 0.0–0.5)
Eosinophils Relative: 4 %
HCT: 25.2 % — ABNORMAL LOW (ref 36.0–46.0)
Hemoglobin: 8 g/dL — ABNORMAL LOW (ref 12.0–15.0)
Immature Granulocytes: 2 %
Lymphocytes Relative: 13 %
Lymphs Abs: 0.9 10*3/uL (ref 0.7–4.0)
MCH: 29.6 pg (ref 26.0–34.0)
MCHC: 31.7 g/dL (ref 30.0–36.0)
MCV: 93.3 fL (ref 80.0–100.0)
Monocytes Absolute: 0.6 10*3/uL (ref 0.1–1.0)
Monocytes Relative: 8 %
Neutro Abs: 4.9 10*3/uL (ref 1.7–7.7)
Neutrophils Relative %: 72 %
Platelets: 247 10*3/uL (ref 150–400)
RBC: 2.7 MIL/uL — ABNORMAL LOW (ref 3.87–5.11)
RDW: 18.4 % — ABNORMAL HIGH (ref 11.5–15.5)
WBC: 6.7 10*3/uL (ref 4.0–10.5)
nRBC: 0 % (ref 0.0–0.2)

## 2022-04-08 LAB — BASIC METABOLIC PANEL
Anion gap: 7 (ref 5–15)
BUN: 15 mg/dL (ref 8–23)
CO2: 27 mmol/L (ref 22–32)
Calcium: 7.8 mg/dL — ABNORMAL LOW (ref 8.9–10.3)
Chloride: 103 mmol/L (ref 98–111)
Creatinine, Ser: 0.82 mg/dL (ref 0.44–1.00)
GFR, Estimated: >60 mL/min (ref >60)
Glucose, Bld: 93 mg/dL (ref 70–99)
Potassium: 4.1 mmol/L (ref 3.5–5.1)
Sodium: 137 mmol/L (ref 135–145)

## 2022-04-08 LAB — LACTATE DEHYDROGENASE: LDH: 153 U/L (ref 98–192)

## 2022-04-08 LAB — HEMOGLOBIN AND HEMATOCRIT, BLOOD
HCT: 31.5 % — ABNORMAL LOW (ref 36.0–46.0)
Hemoglobin: 9.8 g/dL — ABNORMAL LOW (ref 12.0–15.0)

## 2022-04-08 NOTE — Progress Notes (Signed)
Physical Therapy Treatment Patient Details Name: Madison Burns MRN: [id number] DOB: May 07, 1950 Today's Date: 04/08/2022   History of Present Illness Madison Burns is a 72 y.o. Caucasian female with medical history significant for myasthenia gravis, GERD, hypertension, dyslipidemia, hypothyroidism, IBS, OSA on CPAP, who presented to the emergency room with a [name] of recurrent falls.  She had a couple falls with subsequent head injury and facial contusion mainly in the nose.  found to have fifth metacarpal fracture.    PT Comments    PT/OT co-treat for pt safety and to manage IV pole/blood transfusion. Pt's supportive sister at bedside. Pt is alert and cooperative. Requires assistance to safely exit bed, stand to L side platform RW and ambulate to RN station. +2 for management of lines with chair follow for additional safety. Overall pt tolerated well but does have fatigue noted. She remains far from baseline abilities. Recommend SNF placement to maximize her independence while decreasing caregiver burden.   Recommendations for follow up therapy are one component of a multi-disciplinary discharge planning process, led by the attending physician.  Recommendations may be updated based on patient status, additional functional criteria and insurance authorization.  Follow Up Recommendations  Skilled nursing-short term rehab (<3 hours/day)     Assistance Recommended at Discharge Frequent or constant Supervision/Assistance  Patient can return home with the following A little help with walking and/or transfers;A lot of help with bathing/dressing/bathroom;Assistance with cooking/housework;Assistance with feeding;Direct supervision/assist for medications management;Direct supervision/assist for financial management;Assist for transportation;Help with stairs or ramp for entrance   Equipment Recommendations  Rolling walker (2 wheels) (Platform LUE)    Recommendations for Other Services        Precautions / Restrictions Precautions Precautions: Fall Required Braces or Orthoses: Splint/Cast Splint/Cast: on L hand 2/2 5th metacarpal fx. Restrictions Weight Bearing Restrictions: No Other Position/Activity Restrictions: No WB restrictions listed in orders; however, pt does have fx in L hand and spint wrapped.     Mobility  Bed Mobility Overal bed mobility: Needs Assistance Bed Mobility: Supine to Sit  Supine to sit: Min assist, HOB elevated  General bed mobility comments: Min assist to safely achieve EOB sitting    Transfers Overall transfer level: Needs assistance Equipment used: Left platform walker Transfers: Sit to/from Stand Sit to Stand: Min assist   Ambulation/Gait Ambulation/Gait assistance: Min assist, +2 safety/equipment   Assistive device: Left platform walker Gait Pattern/deviations: Step-through pattern, Trunk flexed Gait velocity: decreased     General Gait Details: Pt was able to ambulate to RN desk and back to room. She required min assist at times for navigating RW and turning. Pt tends to allow RW/platform to get too far in front of her. Chair follow for safety    Balance Overall balance assessment: Needs assistance Sitting-balance support: No upper extremity supported Sitting balance-Leahy Scale: Good     Standing balance support: Bilateral upper extremity supported Standing balance-Leahy Scale: Fair Standing balance comment: requires +1 for balance, gait belt, platform walker, chair follow for safety; assist for all lines/leads       Cognition Arousal/Alertness: Awake/alert Behavior During Therapy: WFL for tasks assessed/performed Overall Cognitive Status: History of cognitive impairments - at baseline      General Comments: Sister in room; encouraging pt. Pt with emotional lability (crying) early in session, but recovered quickly and participatory; difficulty following instructions for keeping RW close to body during mobility.           PT Goals (current goals can now be found in  the care plan section) Acute Rehab PT Goals Patient Stated Goal: to get stronger before return home    Frequency    Min 2X/week      PT Plan Current plan remains appropriate    Co-evaluation PT/OT/SLP Co-Evaluation/Treatment: Yes Reason for Co-Treatment: For patient/therapist safety PT goals addressed during session: Mobility/safety with mobility;Balance;Proper use of DME;Strengthening/ROM        AM-PAC PT "6 Clicks" Mobility   Outcome Measure  Help needed turning from your back to your side while in a flat bed without using bedrails?: A Little Help needed moving from lying on your back to sitting on the side of a flat bed without using bedrails?: A Little Help needed moving to and from a bed to a chair (including a wheelchair)?: A Lot Help needed standing up from a chair using your arms (e.g., wheelchair or bedside chair)?: A Lot Help needed to walk in hospital room?: A Lot Help needed climbing 3-5 steps with a railing? : A Lot 6 Click Score: 14    End of Session Equipment Utilized During Treatment: Gait belt Activity Tolerance: Patient tolerated treatment well Patient left: in bed;with call bell/phone within reach;with bed alarm set;with family/visitor present Nurse Communication: Mobility status PT Visit Diagnosis: Difficulty in walking, not elsewhere classified (R26.2);Muscle weakness (generalized) (M62.81)     Time:  -     Charges:                        Julaine Fusi PTA 04/08/22, 7:11 AM

## 2022-04-08 NOTE — Progress Notes (Signed)
Occupational Therapy Treatment Patient Details Name: Madison Burns MRN: [id number] DOB: 08/27/1950 Today's Date: 04/08/2022   History of present illness Madison Burns is a 72 y.o. Caucasian female with medical history significant for myasthenia gravis, GERD, hypertension, dyslipidemia, hypothyroidism, IBS, OSA on CPAP, who presented to the emergency room following a mechanical fall at home. Pt sustained head injury and facial contusion, mainly in the nose, bruising on R hip, and   fifth metacarpal fracture.   OT comments  Madison Burns continues to demonstrate progress, displaying improved balance today, ambulating with hand-held assist, as platform walker was not available. Encouraged pt to perform LE therex in seated position, with visual demonstrations and multimodal cueing. Pt declined grooming in standing but was comfortable performing while seated. Min A for sit<>standing. Pt denies pain today. Pt is not yet at her PLOF; continue to recommend DC to SNF.   Recommendations for follow up therapy are one component of a multi-disciplinary discharge planning process, led by the attending physician.  Recommendations may be updated based on patient status, additional functional criteria and insurance authorization.    Follow Up Recommendations  Skilled nursing-short term rehab (<3 hours/day)     Assistance Recommended at Discharge Frequent or constant Supervision/Assistance  Patient can return home with the following  A lot of help with bathing/dressing/bathroom;Help with stairs or ramp for entrance;A little help with walking and/or transfers;Direct supervision/assist for medications management;Direct supervision/assist for financial management;Assist for transportation   Equipment Recommendations       Recommendations for Other Services      Precautions / Restrictions Precautions Precautions: Fall Required Braces or Orthoses: Splint/Cast Splint/Cast: on L hand 2/2 5th metacarpal  fx. Restrictions Weight Bearing Restrictions: No Other Position/Activity Restrictions: No WB restrictions listed in orders; however, pt does have fx in L hand and spint wrapped.       Mobility Bed Mobility               General bed mobility comments: pt received/left in recliner    Transfers Overall transfer level: Needs assistance Equipment used: 1 person hand held assist Transfers: Sit to/from Stand Sit to Stand: Min assist           General transfer comment: walker not available in room, used hand-held assist     Balance Overall balance assessment: Needs assistance Sitting-balance support: Single extremity supported Sitting balance-Leahy Scale: Good     Standing balance support: Single extremity supported, During functional activity Standing balance-Leahy Scale: Fair                             ADL either performed or assessed with clinical judgement   ADL Overall ADL's : Needs assistance/impaired     Grooming: Supervision/safety;Sitting Grooming Details (indicate cue type and reason): multimodal cueing                                    Extremity/Trunk Assessment Upper Extremity Assessment Upper Extremity Assessment: Generalized weakness LUE Deficits / Details: L hand in ulnar gutter splint   Lower Extremity Assessment Lower Extremity Assessment: Generalized weakness        Vision       Perception     Praxis      Cognition Arousal/Alertness: Awake/alert Behavior During Therapy: WFL for tasks assessed/performed Overall Cognitive Status: History of cognitive impairments - at baseline  Exercises Other Exercises Other Exercises: LE therex in sitting, with ongoing encouragement and multimodal cueing    Shoulder Instructions       General Comments      Pertinent Vitals/ Pain       Pain Assessment Pain Assessment: No/denies pain  Home Living                                           Prior Functioning/Environment              Frequency  Min 2X/week        Progress Toward Goals  OT Goals(current goals can now be found in the care plan section)  Progress towards OT goals: Progressing toward goals  Acute Rehab OT Goals OT Goal Formulation: With patient/family Time For Goal Achievement: 04/17/22 Potential to Achieve Goals: Good  Plan Discharge plan remains appropriate;Frequency remains appropriate    Co-evaluation                 AM-PAC OT "6 Clicks" Daily Activity     Outcome Measure   Help from another person eating meals?: None Help from another person taking care of personal grooming?: A Little Help from another person toileting, which includes using toliet, bedpan, or urinal?: A Lot Help from another person bathing (including washing, rinsing, drying)?: A Lot Help from another person to put on and taking off regular upper body clothing?: A Little Help from another person to put on and taking off regular lower body clothing?: A Lot 6 Click Score: 16    End of Session    OT Visit Diagnosis: Other abnormalities of gait and mobility (R26.89);Muscle weakness (generalized) (M62.81)   Activity Tolerance Patient tolerated treatment well   Patient Left in chair;with call bell/phone within reach;with family/visitor present   Nurse Communication          Time: [id number] OT Time Calculation (min): 12 min  Charges: OT General Charges $OT Visit: 1 Visit OT Treatments $Self Care/Home Management : 8-22 mins Madison Lobo, PhD, MS, OTR/L 04/08/22, 12:42 PM

## 2022-04-08 NOTE — Progress Notes (Addendum)
PROGRESS NOTE    Madison Burns  [id number] DOB: 11-17-1950 DOA: 04/01/2022 PCP: Einar Pheasant, MD   Brief Narrative:  CAPTOLIA Burns is a 72 y.o. Caucasian female with medical history significant for myasthenia gravis, GERD, hypertension, dyslipidemia, hypothyroidism, IBS, OSA on CPAP, who presented to the emergency room with a [name] of recurrent falls.  She had a couple falls with subsequent head injury and facial contusion mainly in the nose.  She stated that she tripped and fell.  No presyncope or syncope.  No headache or dizziness or blurred vision.  No paresthesias or focal muscle weakness.  No weakness seizures.  She had subsequent left hand swelling and pain and was found to have fifth metacarpal fracture.  No dysuria, oliguria or hematuria, urgency or frequency or flank pain.   ED Course: When she came to the ER, BP was 98/52 and temperature 97.4 with otherwise normal vital signs.  BP later on was 113/65.  She did not require any oxygen.  ABG was unremarkable.  CMP was remarkable for hyponatremia and hypochloremia, hypokalemia and a BUN of 24 with creatinine 1.23, magnesium 1.6 and high sensitive troponin I was 8.  Lactic acid was 5.2 and later 2.3 and procalcitonin less than 0.1.  CBC showed leukocytosis 19.4 with neutrophilia, anemia that is new.  Coag profile was within normal.  TSH was 4.34 and free T41.29. EKG as reviewed by me : Normal sinus rhythm with a rate of 78 with suspected left atrial enlargement, LVH and prolonged QT interval. Imaging: Portable chest x-ray showed no acute cardiopulmonary disease.  It showed mild right perihilar distortion that is stable.  Left hand x-ray showed acute to subacute nondisplaced fracture involving the proximal shaft of the fifth metacarpal.  Abdominal and pelvic CT scan revealed the following: 1. 12.7 cm intramuscular and subcutaneous hematoma within the right gluteal region. Extensive overlying subcutaneous edema. 2. Stable post  treatment change within the anterior mediastinum. 3. Stable post ablated changes within the lower pole of the left kidney. 4. Moderate sigmoid diverticulosis without superimposed acute inflammatory change.  Assessment & Plan:   Principal Problem:   ABLA (acute blood loss anemia) Active Problems:   Frequent falls   Nondisplaced fracture of shaft of fifth metacarpal bone, left hand, initial encounter for closed fracture   Myasthenia gravis (HCC)   Hypothyroidism   Paroxysmal atrial fibrillation (HCC)   GERD without esophagitis   SEPSIS RULED OUT  Acute blood loss anemia: No history of melena or GI bleed.  Hemoglobin dropped to 6.3 on 04/03/2022, presumed due to large subcutaneous hematoma within the gluteal region.  She was given 1 unit of PRBC transfusion.  Hemoglobin improved but dropped to 6.9 again on 04/04/2022 and she was given 1 unit of PRBC transfusion, hemoglobin improved to 8.3 but then slowly dropped to 6.6 again on 04/07/2022, transfused 1 more unit.   FOBT has been ordered twice but has not been collected yet.   Per patient and her sister, patient had 1 bowel movement on Friday which was regular color, it was not black or bloody.   Very low suspicion for a GI bleed at this time.  Hematoma size was stable on repeat CT pelvis 2/19.   --Monitor H&H every 12 hours. --Transfuse if Hbg < 7.0 --Further evaluation underway (rule out hemolysis) and will consider hematology consult --Pt on methotrexate which can lead to aplastic anemia but not pancytopenic, so less likely  Intramuscular and subcutaneous hematoma within the right gluteal region: 12.7  cm in size.  Has edema but no erythema.  Repeat CT pelvis on 2/19 due to recurrent Hbg drop - hematoma size was stable.  Monitor closely  Hypokalemia: Resolved  Acute hyponatremia: Presented with 124 which further dropped to 118.  Workup indicates SIADH.  Desmopressin was reportedly started recently (per sister), few months ago for  excessive urination.   --Desmopressin resumed 2/16 - continue this --Sodium level normalized to 137 --Monitor BMP  Dehydration/lactic acidosis: Lactic acidosis resolved, she appears to be better hydrated now.  IV fluids discontinued. Monitor. Encourage PO hydration  Hypotension: Patient is having hypotension but she is asymptomatic and lactic acid is normal.  She is not on any antihypertensives.  She is on diltiazem and amiodarone for atrial fibrillation.  Sepsis ruled out - Upon presentation, she had mild tachypnea and significant leukocytosis and significantly elevated lactic acid level, but was afebrile and her procalcitonin was unremarkable as well.  Suspect her presentation due to dehydration.  There is now evidence of infection or sepsis, sepsis ruled out.   Antibiotics discontinued on the morning of 04/02/2022.   Patient has remained stable, afebrile.   No leukocytosis. Monitor clinically   Frequent falls/generalized weakness: Presumably this was secondary to hyponatremia. --PT and OT and they have recommended SNF.  --Patient and sister agreeable   Nondisplaced fracture of shaft of fifth metacarpal bone, left hand - The patient had her left hand splinted.   --Ortho consulted on admission, recommended maintain split and follow up in 2 weeks as outpatient --Orthopedics consulted - I reached back out at patient and sister's request to be seen here prior to rehab discharge. --Need ortho's activity restriction recommenations for rehab   GERD without esophagitis - We will continue PPI therapy.   Paroxysmal atrial fibrillation  -  continue amiodarone - Not on anticoagulation due to high risk for falls   Hypothyroidism - We will continue Synthroid.   Myasthenia gravis - stable - continue pyridostigmine    DVT prophylaxis: Place and maintain sequential compression device Start: 04/02/22 1129on SCDs due to large gluteal hematoma.   Code Status: DNR   Family Communication:  Sister present at bedside.     Status is: Inpatient Remains inpatient appropriate because: Monitor stability of anemia after needing additional blood transfusion yesterday.  Will plan to d/c if Hbg stable x 48 hours.  Ongoing evaluation as to cause of anemia, given hematoma size is stable.    Estimated body mass index is 40.11 kg/m as calculated from the following:   Height as of this encounter: 5' 4"$  (1.626 m).   Weight as of this encounter: 106 kg.  Pressure Injury 10/23/19 Nose Stage 1 -  Intact skin with non-blanchable redness of a localized area usually over a bony prominence. (Active)  10/23/19 1110  Location: Nose  Location Orientation:   Staging: Stage 1 -  Intact skin with non-blanchable redness of a localized area usually over a bony prominence.  Wound Description (Comments):   Present on Admission:      Pressure Injury Buttocks Left Stage 2 -  Partial thickness loss of dermis presenting as a shallow open injury with a red, pink wound bed without slough. (Active)     Location: Buttocks  Location Orientation: Left  Staging: Stage 2 -  Partial thickness loss of dermis presenting as a shallow open injury with a red, pink wound bed without slough.  Wound Description (Comments):   Present on Admission: Yes (per pt sister from previous biopsy)  Pressure Injury 04/10/20 Buttocks Mid Stage 1 -  Intact skin with non-blanchable redness of a localized area usually over a bony prominence. (Active)  04/10/20 0937  Location: Buttocks  Location Orientation: Mid  Staging: Stage 1 -  Intact skin with non-blanchable redness of a localized area usually over a bony prominence.  Wound Description (Comments):   Present on Admission:    Nutritional Assessment: Body mass index is 40.11 kg/m.[name] Seen by dietician.  I agree with the assessment and plan as outlined below: Nutrition Status:        . Skin Assessment: I have examined the patient's skin and I agree with the wound  assessment as performed by the wound care RN as outlined below: Pressure Injury 10/23/19 Nose Stage 1 -  Intact skin with non-blanchable redness of a localized area usually over a bony prominence. (Active)  10/23/19 1110  Location: Nose  Location Orientation:   Staging: Stage 1 -  Intact skin with non-blanchable redness of a localized area usually over a bony prominence.  Wound Description (Comments):   Present on Admission:      Pressure Injury Buttocks Left Stage 2 -  Partial thickness loss of dermis presenting as a shallow open injury with a red, pink wound bed without slough. (Active)     Location: Buttocks  Location Orientation: Left  Staging: Stage 2 -  Partial thickness loss of dermis presenting as a shallow open injury with a red, pink wound bed without slough.  Wound Description (Comments):   Present on Admission: Yes (per pt sister from previous biopsy)     Pressure Injury 04/10/20 Buttocks Mid Stage 1 -  Intact skin with non-blanchable redness of a localized area usually over a bony prominence. (Active)  04/10/20 0937  Location: Buttocks  Location Orientation: Mid  Staging: Stage 1 -  Intact skin with non-blanchable redness of a localized area usually over a bony prominence.  Wound Description (Comments):   Present on Admission:     Consultants:  Orthopedics  Procedures:  None    Subjective / Interval history:  Patient seen and examined.  Sister at bedside.  Pt reports feeling okay, offers no acute cmplaints.  They ask when orthopedics will be in to see her hand and place a cast.  No other complaints.  Deny any signs of bleeding including blood in stool.         Objective: Vitals:   04/07/22 1719 04/07/22 2048 04/08/22 0549 04/08/22 0815  BP: (!) 106/54 (!) 105/55 (!) 103/55 (!) 115/58  Pulse: 78 80 72 74  Resp: 17 18 19 19  $ Temp: 99.1 F (37.3 C) 97.9 F (36.6 C) 98.8 F (37.1 C) 97.8 F (36.6 C)  TempSrc:  Oral Oral   SpO2: 98% 100% 98% 100%   Weight:      Height:        Intake/Output Summary (Last 24 hours) at 04/08/2022 1301 Last data filed at 04/08/2022 0900 [place] per 24 hour  Intake 307.5 ml  Output 2500 ml  Net -2192.5 ml   Filed Weights   04/01/22 2140  Weight: 106 kg    Examination:  General exam: awake, alert, no acute distress, obese HEENT: moist mucus membranes, hearing grossly normal  Respiratory system: CTAB diminished bases due to inspirations, no wheezes, normal respiratory effort. Cardiovascular system: normal S1/S2, RRR, non-pitting lower extremity edema.   Gastrointestinal system: soft, NT, NDs. Central nervous system: A&O x3. no Aubry focal neurologic deficits, normal speech Skin: dry, intact, normal temperature  Psychiatry: normal mood, congruent affect, judgement and insight appear normal    Labs & Data Reviewed:   Notable labs-- BMP normal except Ca 7.8.   Hbg trend since yesterday: 6.6 (given 1 unit blood transfusion >> Hbg 8.9 >> 8.0 this AM  Repeat CT pelvis yesterday with stable right buttock hematoma, it had not expanded.      LOS: 6 days   Ezekiel Slocumb, DO [facility]  04/08/2022, 1:01 PM   *Please note that this is a verbal dictation therefore any spelling or grammatical errors are due to the "[place] [facility]" system interpretation.  Please page via [place] and do not message via secure chat for urgent patient care matters. Secure chat can be used for non urgent patient care matters.  How to contact the [facility] Attending or Consulting provider [place] or covering provider during after hours [place], for this patient?  Check the care team in [facility] and look for a) attending/consulting TRH provider listed and b) the [facility] team listed. Page or secure chat [phone]. Log into [email or web address] and use [place]'s universal password to access. If you do not have the password, please contact the hospital operator. Locate the [facility] provider you are looking for under [facility] and  page to a number that you can be directly reached. If you still have difficulty reaching the provider, please page the [facility] (Director on Call) for the Hospitalists listed on amion for assistance.

## 2022-04-08 NOTE — Progress Notes (Signed)
NIF x'3, -34 best result. PT performed well, with good effort.

## 2022-04-08 NOTE — Progress Notes (Signed)
NIF -35  

## 2022-04-09 DIAGNOSIS — D62 Acute posthemorrhagic anemia: Secondary | ICD-10-CM

## 2022-04-09 LAB — BASIC METABOLIC PANEL
Anion gap: 8 (ref 5–15)
BUN: 15 mg/dL (ref 8–23)
CO2: 28 mmol/L (ref 22–32)
Calcium: 8.3 mg/dL — ABNORMAL LOW (ref 8.9–10.3)
Chloride: 101 mmol/L (ref 98–111)
Creatinine, Ser: 0.77 mg/dL (ref 0.44–1.00)
GFR, Estimated: >60 mL/min (ref >60)
Glucose, Bld: 112 mg/dL — ABNORMAL HIGH (ref 70–99)
Potassium: 4.1 mmol/L (ref 3.5–5.1)
Sodium: 137 mmol/L (ref 135–145)

## 2022-04-09 LAB — CBC WITH DIFFERENTIAL/PLATELET
Abs Immature Granulocytes: 0.11 10*3/uL — ABNORMAL HIGH (ref 0.00–0.07)
Basophils Absolute: 0 10*3/uL (ref 0.0–0.1)
Basophils Relative: 0 %
Eosinophils Absolute: 0.1 10*3/uL (ref 0.0–0.5)
Eosinophils Relative: 1 %
HCT: 27.9 % — ABNORMAL LOW (ref 36.0–46.0)
Hemoglobin: 8.9 g/dL — ABNORMAL LOW (ref 12.0–15.0)
Immature Granulocytes: 1 %
Lymphocytes Relative: 7 %
Lymphs Abs: 0.8 10*3/uL (ref 0.7–4.0)
MCH: 29.6 pg (ref 26.0–34.0)
MCHC: 31.9 g/dL (ref 30.0–36.0)
MCV: 92.7 fL (ref 80.0–100.0)
Monocytes Absolute: 0.6 10*3/uL (ref 0.1–1.0)
Monocytes Relative: 6 %
Neutro Abs: 8.7 10*3/uL — ABNORMAL HIGH (ref 1.7–7.7)
Neutrophils Relative %: 85 %
Platelets: 291 10*3/uL (ref 150–400)
RBC: 3.01 MIL/uL — ABNORMAL LOW (ref 3.87–5.11)
RDW: 17.9 % — ABNORMAL HIGH (ref 11.5–15.5)
WBC: 10.3 10*3/uL (ref 4.0–10.5)
nRBC: 0 % (ref 0.0–0.2)

## 2022-04-09 LAB — HAPTOGLOBIN: Haptoglobin: 110 mg/dL (ref 37–355)

## 2022-04-09 LAB — MAGNESIUM: Magnesium: 1.8 mg/dL (ref 1.7–2.4)

## 2022-04-09 NOTE — Progress Notes (Signed)
-  35 NIF performed with good effort

## 2022-04-09 NOTE — TOC Progression Note (Signed)
Transition of Care [facility]) - Progression Note    Patient Details  Name: Madison Burns MRN: [id number] Date of Birth: 1950/05/29  [facility] of [facility]) CM/SW Contact  Gerilyn Pilgrim, LCSW Phone Number: 04/09/2022, 3:31 PM  Clinical Narrative:   Josem Kaufmann obtained for [facility] in [place]. [facility] notified. Auth good for  04/09/2022-04/11/2022.     Barriers to Discharge: Continued Medical Work up  Expected Discharge Plan and Services                                               Social Determinants of Health (SDOH) Interventions SDOH Screenings   Depression (PHQ2-9): Low Risk  (11/11/2021)  Tobacco Use: Low Risk  (04/02/2022)    Readmission Risk Interventions    04/09/2020    1:25 PM  Readmission Risk Prevention Plan  Transportation Screening Complete  PCP or Specialist Appt within 3-5 Days Complete  HRI or [place] Complete  Social Work Consult for [place] Planning/Counseling Complete  Palliative Care Screening Not Applicable  Medication Review [employer]) Complete

## 2022-04-09 NOTE — Progress Notes (Signed)
Physical Therapy Treatment Patient Details Name: Madison Burns MRN: [id number] DOB: [birth year] Today's Date: 04/09/2022   History of Present Illness Madison Burns is a 72 y.o. Caucasian female with medical history significant for myasthenia gravis, GERD, hypertension, dyslipidemia, hypothyroidism, IBS, OSA on CPAP, who presented to the emergency room following a mechanical fall at home. Pt sustained head injury and facial contusion, mainly in the nose, bruising on R hip, and   fifth metacarpal fracture.    PT Comments    Patient received in bed, initially declining PT. With sister's encouragement, patient agrees to get out of bed. Patient requires increased time and cues for initiation of mobility. Min A physically for bed mobility. She is able to stand with min A to boost up and cues for hand placement (Bed slightly elevated). She ambulated 25 feet with left platform RW. (Declined ambulating out into hall this session.) She will continue to benefit from skilled PT to improve functional independence and strength for safety at home.     Recommendations for follow up therapy are one component of a multi-disciplinary discharge planning process, led by the attending physician.  Recommendations may be updated based on patient status, additional functional criteria and insurance authorization.  Follow Up Recommendations  Skilled nursing-short term rehab (<3 hours/day) Can patient physically be transported by private vehicle: Yes   Assistance Recommended at Discharge Frequent or constant Supervision/Assistance  Patient can return home with the following A little help with walking and/or transfers;A lot of help with bathing/dressing/bathroom;Assistance with cooking/housework;Assistance with feeding;Direct supervision/assist for medications management;Direct supervision/assist for financial management;Assist for transportation;Help with stairs or ramp for entrance   Equipment Recommendations   Rolling walker (2 wheels)    Recommendations for Other Services       Precautions / Restrictions Precautions Precautions: Fall Required Braces or Orthoses: Splint/Cast Splint/Cast: on L hand 2/2 5th metacarpal fx. Restrictions Weight Bearing Restrictions: No Other Position/Activity Restrictions: No WB restrictions listed in orders; however, pt does have fx in L hand and spint wrapped.     Mobility  Bed Mobility Overal bed mobility: Needs Assistance Bed Mobility: Supine to Sit     Supine to sit: Min assist, HOB elevated     General bed mobility comments: No assist needed to return to supine from sitting, cues for positioning in bed    Transfers Overall transfer level: Needs assistance Equipment used: Rolling walker (2 wheels) Transfers: Sit to/from Stand Sit to Stand: Min assist, From elevated surface           General transfer comment: bed slightly elevated, min A to boost up with initial standing, no assistance given 2nd time standing.    Ambulation/Gait Ambulation/Gait assistance: Min guard Gait Distance (Feet): 25 Feet Assistive device: Left platform walker Gait Pattern/deviations: Step-through pattern, Decreased step length - right, Decreased step length - left, Decreased stride length, Trunk flexed Gait velocity: decreased     General Gait Details: Patient declined ambulating out of room today. Ambulated to door and back to bed. Cues for proximity to RW.   Stairs             Wheelchair Mobility    Modified Rankin (Stroke Patients Only)       Balance Overall balance assessment: Needs assistance Sitting-balance support: Feet supported Sitting balance-Leahy Scale: Good     Standing balance support: Bilateral upper extremity supported, During functional activity, Reliant on assistive device for balance Standing balance-Leahy Scale: Fair Standing balance comment: requires +1 for balance, gait belt, platform  walker                             Cognition Arousal/Alertness: Awake/alert Behavior During Therapy: WFL for tasks assessed/performed Overall Cognitive Status: History of cognitive impairments - at baseline                                 General Comments: Sister in room; encouraging pt; difficulty following instructions for keeping RW close to body during mobility. Increased time ( slow to respond)        Exercises      General Comments        Pertinent Vitals/Pain Pain Assessment Pain Assessment: PAINAD Breathing: normal Negative Vocalization: none Facial Expression: smiling or inexpressive Body Language: relaxed Consolability: no need to console PAINAD Score: 0 Pain Location: L hand    Home Living                          Prior Function            PT Goals (current goals can now be found in the care plan section) Acute Rehab PT Goals Patient Stated Goal: to get stronger before return home PT Goal Formulation: With patient/family Time For Goal Achievement: 04/17/22 Potential to Achieve Goals: Good Progress towards PT goals: Progressing toward goals    Frequency    Min 2X/week      PT Plan Discharge plan needs to be updated    Co-evaluation              AM-PAC PT "6 Clicks" Mobility   Outcome Measure  Help needed turning from your back to your side while in a flat bed without using bedrails?: A Little Help needed moving from lying on your back to sitting on the side of a flat bed without using bedrails?: A Little Help needed moving to and from a bed to a chair (including a wheelchair)?: A Little Help needed standing up from a chair using your arms (e.g., wheelchair or bedside chair)?: A Little Help needed to walk in hospital room?: A Lot Help needed climbing 3-5 steps with a railing? : A Lot 6 Click Score: 16    End of Session Equipment Utilized During Treatment: Gait belt Activity Tolerance: Patient limited by fatigue Patient left: in  bed;with call bell/phone within reach;with bed alarm set;with family/visitor present Nurse Communication: Mobility status PT Visit Diagnosis: Difficulty in walking, not elsewhere classified (R26.2);Muscle weakness (generalized) (M62.81)     Time: [id number] PT Time Calculation (min) (ACUTE ONLY): 23 min  Charges:  $Gait Training: 23-37 mins                     Rayce Brahmbhatt, PT, GCS 04/09/22,2:20 PM

## 2022-04-09 NOTE — Care Management Important Message (Signed)
Important Message  Patient Details  Name: Madison Burns MRN: [id number] Date of Birth: 04/20/1950   Medicare Important Message Given:  Yes     Juliann Pulse A Anjeli Casad 04/09/2022, 12:14 PM

## 2022-04-09 NOTE — Progress Notes (Signed)
Nif -36

## 2022-04-09 NOTE — Plan of Care (Signed)
  Problem: Fluid Volume: Goal: Hemodynamic stability will improve Outcome: Progressing   Problem: Clinical Measurements: Goal: Diagnostic test results will improve Outcome: Progressing   Problem: Respiratory: Goal: Ability to maintain adequate ventilation will improve Outcome: Progressing   Problem: Education: Goal: Knowledge of General Education information will improve Description: Including pain rating scale, medication(s)/side effects and non-pharmacologic comfort measures Outcome: Progressing   Problem: Health Behavior/Discharge Planning: Goal: Ability to manage health-related needs will improve Outcome: Progressing   Problem: Clinical Measurements: Goal: Ability to maintain clinical measurements within normal limits will improve Outcome: Progressing   Problem: Activity: Goal: Risk for activity intolerance will decrease Outcome: Progressing   Problem: Elimination: Goal: Will not experience complications related to bowel motility Outcome: Progressing

## 2022-04-09 NOTE — Progress Notes (Signed)
PROGRESS NOTE    Madison Burns  [id number] DOB: 1950-11-17 DOA: 04/01/2022 PCP: Einar Pheasant, MD   Brief Narrative:  Madison Burns is a 72 y.o. Caucasian female with medical history significant for myasthenia gravis, GERD, hypertension, dyslipidemia, hypothyroidism, IBS, OSA on CPAP, who presented to the emergency room with a [name] of recurrent falls.  She had a couple falls with subsequent head injury and facial contusion mainly in the nose.  She stated that she tripped and fell.  No presyncope or syncope.  No headache or dizziness or blurred vision.  No paresthesias or focal muscle weakness.  No weakness seizures.  She had subsequent left hand swelling and pain and was found to have fifth metacarpal fracture.  No dysuria, oliguria or hematuria, urgency or frequency or flank pain.   ED Course: When she came to the ER, BP was 98/52 and temperature 97.4 with otherwise normal vital signs.  BP later on was 113/65.  She did not require any oxygen.  ABG was unremarkable.  CMP was remarkable for hyponatremia and hypochloremia, hypokalemia and a BUN of 24 with creatinine 1.23, magnesium 1.6 and high sensitive troponin I was 8.  Lactic acid was 5.2 and later 2.3 and procalcitonin less than 0.1.  CBC showed leukocytosis 19.4 with neutrophilia, anemia that is new.  Coag profile was within normal.  TSH was 4.34 and free T41.29. EKG as reviewed by me : Normal sinus rhythm with a rate of 78 with suspected left atrial enlargement, LVH and prolonged QT interval. Imaging: Portable chest x-ray showed no acute cardiopulmonary disease.  It showed mild right perihilar distortion that is stable.  Left hand x-ray showed acute to subacute nondisplaced fracture involving the proximal shaft of the fifth metacarpal.  Abdominal and pelvic CT scan revealed the following: 1. 12.7 cm intramuscular and subcutaneous hematoma within the right gluteal region. Extensive overlying subcutaneous edema. 2. Stable post  treatment change within the anterior mediastinum. 3. Stable post ablated changes within the lower pole of the left kidney. 4. Moderate sigmoid diverticulosis without superimposed acute inflammatory change.  Assessment & Plan:   Principal Problem:   ABLA (acute blood loss anemia) Active Problems:   Frequent falls   Nondisplaced fracture of shaft of fifth metacarpal bone, left hand, initial encounter for closed fracture   Myasthenia gravis (HCC)   Hypothyroidism   Paroxysmal atrial fibrillation (HCC)   GERD without esophagitis   SEPSIS RULED OUT  Acute blood loss anemia: No history of melena or GI bleed.  Hemoglobin dropped to 6.3 on 04/03/2022, presumed due to large subcutaneous hematoma within the gluteal region.  She was given 1 unit of PRBC transfusion.  Hemoglobin improved but dropped to 6.9 again on 04/04/2022 and she was given 1 unit of PRBC transfusion, hemoglobin improved to 8.3 but then slowly dropped to 6.6 again on 04/07/2022, transfused 1 more unit.   FOBT has been ordered twice but has not been collected yet.   Per patient and her sister, patient had 1 bowel movement on Friday which was regular color, it was not black or bloody.   Very low suspicion for a GI bleed at this time.  Hematoma size was stable on repeat CT pelvis 2/19.   --Monitor H&H every 12 hours. --Transfuse if Hbg < 7.0 --Further evaluation underway (rule out hemolysis) and will consider hematology consult --Pt on methotrexate which can lead to aplastic anemia but not pancytopenic, so less likely  Intramuscular and subcutaneous hematoma within the right gluteal region: 12.7  cm in size.  Has edema but no erythema.  Repeat CT pelvis on 2/19 due to recurrent Hbg drop - hematoma size was stable.  Monitor closely  Hypokalemia: Resolved  Acute hyponatremia: Presented with 124 which further dropped to 118.  Workup indicates SIADH.  Desmopressin was reportedly started recently (per sister), few months ago for  excessive urination.   --Desmopressin resumed 2/16 - continue this --Sodium level normalized to 137 --Monitor BMP  Dehydration/lactic acidosis: Lactic acidosis resolved, she appears to be better hydrated now.  IV fluids discontinued. Monitor. Encourage PO hydration  Hypotension: Patient is having hypotension but she is asymptomatic and lactic acid is normal.  She is not on any antihypertensives.  She is on diltiazem and amiodarone for atrial fibrillation.  Sepsis ruled out - Upon presentation, she had mild tachypnea and significant leukocytosis and significantly elevated lactic acid level, but was afebrile and her procalcitonin was unremarkable as well.  Suspect her presentation due to dehydration.  There is now evidence of infection or sepsis, sepsis ruled out.   Antibiotics discontinued on the morning of 04/02/2022.   Patient has remained stable, afebrile.   No leukocytosis. Monitor clinically   Frequent falls/generalized weakness: Presumably this was secondary to hyponatremia. --PT and OT and they have recommended SNF.  --Patient and sister agreeable   Nondisplaced fracture of shaft of fifth metacarpal bone, left hand - The patient had her left hand splinted.   --Ortho consulted on admission, recommended maintain split and follow up in 2 weeks as outpatient --Orthopedics consulted - I reached back out at patient and sister's request to be seen here prior to rehab discharge. --Need ortho's activity restriction recommenations for rehab   GERD without esophagitis - We will continue PPI therapy.   Paroxysmal atrial fibrillation  -  continue amiodarone - Not on anticoagulation due to high risk for falls   Hypothyroidism - We will continue Synthroid.   Myasthenia gravis - stable - continue pyridostigmine    DVT prophylaxis: Place and maintain sequential compression device Start: 04/02/22 1129on SCDs due to large gluteal hematoma.   Code Status: DNR   Family Communication:  Sister present at bedside 2/20.     Status is: Inpatient Remains inpatient appropriate because: monitor anemia for stability another 24 hours. Anticipate stable to d/c to SNF/rehab tomorrow.   Estimated body mass index is 40.11 kg/m as calculated from the following:   Height as of this encounter: 5' 4"$  (1.626 m).   Weight as of this encounter: 106 kg.  Pressure Injury 10/23/19 Nose Stage 1 -  Intact skin with non-blanchable redness of a localized area usually over a bony prominence. (Active)  10/23/19 1110  Location: Nose  Location Orientation:   Staging: Stage 1 -  Intact skin with non-blanchable redness of a localized area usually over a bony prominence.  Wound Description (Comments):   Present on Admission:      Pressure Injury Buttocks Left Stage 2 -  Partial thickness loss of dermis presenting as a shallow open injury with a red, pink wound bed without slough. (Active)     Location: Buttocks  Location Orientation: Left  Staging: Stage 2 -  Partial thickness loss of dermis presenting as a shallow open injury with a red, pink wound bed without slough.  Wound Description (Comments):   Present on Admission: Yes (per pt sister from previous biopsy)     Pressure Injury 04/10/20 Buttocks Mid Stage 1 -  Intact skin with non-blanchable redness of a localized area usually  over a bony prominence. (Active)  04/10/20 0937  Location: Buttocks  Location Orientation: Mid  Staging: Stage 1 -  Intact skin with non-blanchable redness of a localized area usually over a bony prominence.  Wound Description (Comments):   Present on Admission:    Nutritional Assessment: Body mass index is 40.11 kg/m.[name] Seen by dietician.  I agree with the assessment and plan as outlined below: Nutrition Status:        . Skin Assessment: I have examined the patient's skin and I agree with the wound assessment as performed by the wound care RN as outlined below: Pressure Injury 10/23/19 Nose Stage 1 -   Intact skin with non-blanchable redness of a localized area usually over a bony prominence. (Active)  10/23/19 1110  Location: Nose  Location Orientation:   Staging: Stage 1 -  Intact skin with non-blanchable redness of a localized area usually over a bony prominence.  Wound Description (Comments):   Present on Admission:      Pressure Injury Buttocks Left Stage 2 -  Partial thickness loss of dermis presenting as a shallow open injury with a red, pink wound bed without slough. (Active)     Location: Buttocks  Location Orientation: Left  Staging: Stage 2 -  Partial thickness loss of dermis presenting as a shallow open injury with a red, pink wound bed without slough.  Wound Description (Comments):   Present on Admission: Yes (per pt sister from previous biopsy)     Pressure Injury 04/10/20 Buttocks Mid Stage 1 -  Intact skin with non-blanchable redness of a localized area usually over a bony prominence. (Active)  04/10/20 0937  Location: Buttocks  Location Orientation: Mid  Staging: Stage 1 -  Intact skin with non-blanchable redness of a localized area usually over a bony prominence.  Wound Description (Comments):   Present on Admission:     Consultants:  Orthopedics  Procedures:  None    Subjective / Interval history:  Patient seen and examined, she was sleeping but woke to voice.  Sister was not in the room at the time.   Pt denies acute complaints or issues. No acute issues reported from nursing staff.       Objective: Vitals:   04/08/22 0815 04/08/22 1934 04/09/22 0430 04/09/22 0745  BP: (!) 115/58 127/60 (!) 118/53 (!) 106/59  Pulse: 74 78 81 80  Resp: 19 20 18 17  $ Temp: 97.8 F (36.6 C) 97.9 F (36.6 C) 98.3 F (36.8 C) 97.9 F (36.6 C)  TempSrc:  Oral    SpO2: 100% 99% 97% 97%  Weight:      Height:        Intake/Output Summary (Last 24 hours) at 04/09/2022 1236 Last data filed at 04/09/2022 0117 [place] per 24 hour  Intake --  Output 1300 ml  Net  -1300 ml   Filed Weights   04/01/22 2140  Weight: 106 kg    Examination:  General exam: sleeping, woke to voice, no acute distress, obese HEENT: moist mucus membranes, hearing grossly normal  Respiratory system: CTAB diminished bases due to inspirations, no wheezes, normal respiratory effort. Cardiovascular system: normal S1/S2, RRR, non-pitting lower extremity edema.   Gastrointestinal system: soft, NT, NDs. Central nervous system: no [place] focal neurologic deficits, normal speech, otherwise exam limited by somnolence Skin: dry, intact, normal temperature Psychiatry: normal mood, congruent affect    Labs & Data Reviewed:   Notable labs-- BMP normal except glucose 112, Ca 8.3.   Hbg trend since yesterday:  Hbg 8.9 >> 8.0 >> 9.8 >> 8.9  this AM WBC normal but up 10.3 from 6.7 with mild left shift   Repeat CT pelvis 2/19 with stable right buttock hematoma, it had not expanded.      LOS: 7 days   Ezekiel Slocumb, DO [facility]  04/09/2022, 12:36 PM   *Please note that this is a verbal dictation therefore any spelling or grammatical errors are due to the "[place] [employer]" system interpretation.  Please page via [place] and do not message via secure chat for urgent patient care matters. Secure chat can be used for non urgent patient care matters.  How to contact the [facility] Attending or Consulting provider [place] or covering provider during after hours [place], for this patient?  Check the care team in [facility] and look for a) attending/consulting TRH provider listed and b) the [facility] team listed. Page or secure chat [phone]. Log into [email or web address] and use [place]'s universal password to access. If you do not have the password, please contact the hospital operator. Locate the [facility] provider you are looking for under [facility] and page to a number that you can be directly reached. If you still have difficulty reaching the provider, please page the [facility] (Director on Call)  for the Hospitalists listed on amion for assistance.

## 2022-04-09 NOTE — Progress Notes (Signed)
Occupational Therapy Treatment Patient Details Name: Madison Burns MRN: [id number] DOB: [birth year] Today's Date: 04/09/2022   History of present illness Madison Burns is a 72 y.o. Caucasian female with medical history significant for myasthenia gravis, GERD, hypertension, dyslipidemia, hypothyroidism, IBS, OSA on CPAP, who presented to the emergency room following a mechanical fall at home. Pt sustained head injury and facial contusion, mainly in the nose, bruising on R hip, and   fifth metacarpal fracture.   OT comments  Patient received supine in bed with sister present. Both agreeable to OT. Tx session targeted increasing activity tolerance for improved ADL completion. LUE splint still intact. Pt completed bed mobility with supervision-CGA this date. Once sitting EOB, pt engaged in BUE AROM exercises (see details below). Pt able to stand from EOB with Min A and take steps toward bedside sink using L platform walker. Pt then engaged in sinkside grooming tasks with Min guard for safety. Pt tolerated activities well this date and was left as received with all needs in reach. Pt is making progress toward goal completion. D/C recommendation remains appropriate. OT will continue to follow acutely.       Recommendations for follow up therapy are one component of a multi-disciplinary discharge planning process, led by the attending physician.  Recommendations may be updated based on patient status, additional functional criteria and insurance authorization.    Follow Up Recommendations  Skilled nursing-short term rehab (<3 hours/day)     Assistance Recommended at Discharge Frequent or constant Supervision/Assistance  Patient can return home with the following  A lot of help with bathing/dressing/bathroom;Help with stairs or ramp for entrance;A little help with walking and/or transfers;Direct supervision/assist for medications management;Direct supervision/assist for financial management;Assist for  transportation   Equipment Recommendations  Other (comment) (defer to next venue of care)    Recommendations for Other Services      Precautions / Restrictions Precautions Precautions: Fall Required Braces or Orthoses: Splint/Cast Splint/Cast: on L hand 2/2 5th metacarpal fx. Restrictions Weight Bearing Restrictions: No Other Position/Activity Restrictions: No WB restrictions listed in orders; however, pt does have fx in L hand and spint wrapped.       Mobility Bed Mobility Overal bed mobility: Needs Assistance Bed Mobility: Supine to Sit, Sit to Supine     Supine to sit: HOB elevated, Min guard Sit to supine: Supervision        Transfers Overall transfer level: Needs assistance Equipment used: Left platform walker Transfers: Sit to/from Stand Sit to Stand: Min assist                 Balance Overall balance assessment: Needs assistance Sitting-balance support: Feet supported Sitting balance-Leahy Scale: Good     Standing balance support: Bilateral upper extremity supported, During functional activity, Reliant on assistive device for balance Standing balance-Leahy Scale: Fair                             ADL either performed or assessed with clinical judgement   ADL Overall ADL's : Needs assistance/impaired     Grooming: Min guard;Standing;Brushing hair               Lower Body Dressing: Maximal assistance;Bed level Lower Body Dressing Details (indicate cue type and reason): socks Toilet Transfer: Minimal assistance;Rolling walker (2 wheels) Toilet Transfer Details (indicate cue type and reason): simulated         Functional mobility during ADLs: Min guard;Rolling walker (2 wheels) (to  take several steps to bedside sink and then lateral steps toward [facility])      Extremity/Trunk Assessment Upper Extremity Assessment Upper Extremity Assessment: Generalized weakness LUE Deficits / Details: L hand in ulnar gutter splint   Lower  Extremity Assessment Lower Extremity Assessment: Generalized weakness        Vision Patient Visual Report: No change from baseline     Perception     Praxis      Cognition Arousal/Alertness: Awake/alert Behavior During Therapy: WFL for tasks assessed/performed Overall Cognitive Status: History of cognitive impairments - at baseline                                 General Comments: Sister in room encouraging pt to participate, slow processing        Exercises General Exercises - Upper Extremity Shoulder Flexion: AROM, Both, 10 reps, Seated Shoulder Horizontal ABduction: AROM, Both, 10 reps, Seated Shoulder Horizontal ADduction: AROM, Both, 10 reps, Seated Elbow Flexion: AROM, Both, 10 reps, Seated Elbow Extension: AROM, Both, 10 reps, Seated    Shoulder Instructions       General Comments      Pertinent Vitals/ Pain       Pain Assessment Pain Assessment: Faces Faces Pain Scale: Hurts a little bit Pain Location: L hand Pain Descriptors / Indicators: Aching Pain Intervention(s): Limited activity within patient's tolerance, Monitored during session, Repositioned  Home Living                                          Prior Functioning/Environment              Frequency  Min 2X/week        Progress Toward Goals  OT Goals(current goals can now be found in the care plan section)  Progress towards OT goals: Progressing toward goals  Acute Rehab OT Goals Patient Stated Goal: go home OT Goal Formulation: With patient/family Time For Goal Achievement: 04/17/22 Potential to Achieve Goals: Good  Plan Discharge plan remains appropriate;Frequency remains appropriate    Co-evaluation                 AM-PAC OT "6 Clicks" Daily Activity     Outcome Measure   Help from another person eating meals?: None Help from another person taking care of personal grooming?: A Little Help from another person toileting, which  includes using toliet, bedpan, or urinal?: A Lot Help from another person bathing (including washing, rinsing, drying)?: A Lot Help from another person to put on and taking off regular upper body clothing?: A Little Help from another person to put on and taking off regular lower body clothing?: A Lot 6 Click Score: 16    End of Session Equipment Utilized During Treatment: Other (comment);Gait belt (L platform walker)  OT Visit Diagnosis: Other abnormalities of gait and mobility (R26.89);Muscle weakness (generalized) (M62.81)   Activity Tolerance Patient tolerated treatment well   Patient Left in bed;with call bell/phone within reach;with bed alarm set;with family/visitor present   Nurse Communication Mobility status        Time: [id number] OT Time Calculation (min): 18 min  Charges: OT General Charges $OT Visit: 1 Visit OT Treatments $Self Care/Home Management : 8-22 mins  [facility] MS, OTR/L ascom [phone]  04/09/22, 5:14 PM

## 2022-04-09 NOTE — TOC Progression Note (Signed)
Transition of Care [facility]) - Progression Note    Patient Details  Name: Madison Burns MRN: [id number] Date of Birth: 05/03/1950  Transition of Care [facility]) CM/SW Contact  Gerilyn Pilgrim, LCSW Phone Number: 04/09/2022, 9:22 AM  Clinical Narrative:   Josem Kaufmann started for [facility] in [place].       Barriers to Discharge: Continued Medical Work up  Expected Discharge Plan and Services                                               Social Determinants of Health (SDOH) Interventions SDOH Screenings   Depression (PHQ2-9): Low Risk  (11/11/2021)  Tobacco Use: Low Risk  (04/02/2022)    Readmission Risk Interventions    04/09/2020    1:25 PM  Readmission Risk Prevention Plan  Transportation Screening Complete  PCP or Specialist Appt within 3-5 Days Complete  HRI or [place] Complete  Social Work Consult for [place] Planning/Counseling Complete  Palliative Care Screening Not Applicable  Medication Review Press photographer) Complete

## 2022-04-10 ENCOUNTER — Encounter: Payer: Self-pay | Admitting: Family Medicine

## 2022-04-10 DIAGNOSIS — M6259 Muscle wasting and atrophy, not elsewhere classified, multiple sites: Secondary | ICD-10-CM | POA: Diagnosis not present

## 2022-04-10 DIAGNOSIS — Z79899 Other long term (current) drug therapy: Secondary | ICD-10-CM | POA: Diagnosis not present

## 2022-04-10 DIAGNOSIS — E039 Hypothyroidism, unspecified: Secondary | ICD-10-CM | POA: Diagnosis not present

## 2022-04-10 DIAGNOSIS — G7000 Myasthenia gravis without (acute) exacerbation: Secondary | ICD-10-CM | POA: Diagnosis not present

## 2022-04-10 DIAGNOSIS — E876 Hypokalemia: Secondary | ICD-10-CM

## 2022-04-10 DIAGNOSIS — E871 Hypo-osmolality and hyponatremia: Secondary | ICD-10-CM

## 2022-04-10 DIAGNOSIS — R296 Repeated falls: Secondary | ICD-10-CM | POA: Diagnosis not present

## 2022-04-10 DIAGNOSIS — M7981 Nontraumatic hematoma of soft tissue: Secondary | ICD-10-CM | POA: Diagnosis not present

## 2022-04-10 DIAGNOSIS — K589 Irritable bowel syndrome without diarrhea: Secondary | ICD-10-CM | POA: Diagnosis not present

## 2022-04-10 DIAGNOSIS — K219 Gastro-esophageal reflux disease without esophagitis: Secondary | ICD-10-CM | POA: Diagnosis not present

## 2022-04-10 DIAGNOSIS — I499 Cardiac arrhythmia, unspecified: Secondary | ICD-10-CM | POA: Diagnosis not present

## 2022-04-10 DIAGNOSIS — Z741 Need for assistance with personal care: Secondary | ICD-10-CM | POA: Diagnosis not present

## 2022-04-10 DIAGNOSIS — G4733 Obstructive sleep apnea (adult) (pediatric): Secondary | ICD-10-CM | POA: Diagnosis not present

## 2022-04-10 DIAGNOSIS — D62 Acute posthemorrhagic anemia: Secondary | ICD-10-CM | POA: Diagnosis not present

## 2022-04-10 DIAGNOSIS — E441 Mild protein-calorie malnutrition: Secondary | ICD-10-CM | POA: Diagnosis not present

## 2022-04-10 DIAGNOSIS — R262 Difficulty in walking, not elsewhere classified: Secondary | ICD-10-CM | POA: Diagnosis not present

## 2022-04-10 DIAGNOSIS — S62357A Nondisplaced fracture of shaft of fifth metacarpal bone, left hand, initial encounter for closed fracture: Secondary | ICD-10-CM | POA: Diagnosis not present

## 2022-04-10 DIAGNOSIS — S62357D Nondisplaced fracture of shaft of fifth metacarpal bone, left hand, subsequent encounter for fracture with routine healing: Secondary | ICD-10-CM | POA: Diagnosis not present

## 2022-04-10 DIAGNOSIS — M6281 Muscle weakness (generalized): Secondary | ICD-10-CM | POA: Diagnosis not present

## 2022-04-10 DIAGNOSIS — R279 Unspecified lack of coordination: Secondary | ICD-10-CM | POA: Diagnosis not present

## 2022-04-10 DIAGNOSIS — E872 Acidosis, unspecified: Secondary | ICD-10-CM

## 2022-04-10 DIAGNOSIS — I480 Paroxysmal atrial fibrillation: Secondary | ICD-10-CM | POA: Diagnosis not present

## 2022-04-10 HISTORY — DX: Acidosis, unspecified: E87.20

## 2022-04-10 HISTORY — DX: Hypokalemia: E87.6

## 2022-04-10 HISTORY — DX: Hypo-osmolality and hyponatremia: E87.1

## 2022-04-10 LAB — CBC
HCT: 28.1 % — ABNORMAL LOW (ref 36.0–46.0)
Hemoglobin: 8.9 g/dL — ABNORMAL LOW (ref 12.0–15.0)
MCH: 29.3 pg (ref 26.0–34.0)
MCHC: 31.7 g/dL (ref 30.0–36.0)
MCV: 92.4 fL (ref 80.0–100.0)
Platelets: 284 10*3/uL (ref 150–400)
RBC: 3.04 MIL/uL — ABNORMAL LOW (ref 3.87–5.11)
RDW: 18.4 % — ABNORMAL HIGH (ref 11.5–15.5)
WBC: 8.1 10*3/uL (ref 4.0–10.5)
nRBC: 0 % (ref 0.0–0.2)

## 2022-04-10 MED ORDER — MAGNESIUM HYDROXIDE 400 MG/5ML PO SUSP: 30.0000 mL | Freq: Every day | ORAL | 0 refills | Status: AC | PRN

## 2022-04-10 NOTE — TOC Transition Note (Signed)
Transition of Care [facility]) - CM/SW Discharge Note   Patient Details  Name: SHANIKWA CANIZALEZ MRN: [id number] Date of Birth: 04-13-1950  Transition of Care [facility]) CM/SW Contact:  Gerilyn Pilgrim, LCSW Phone Number: 04/10/2022, 10:45 AM   Clinical Narrative:   Pt has orders to discharge. DC summary sent to [facility]. RN given number for report. Family to transport to facility. DNR on chart.     Final next level of care: Skilled Nursing Facility Barriers to Discharge: Barriers Resolved   Patient Goals and CMS Choice CMS Medicare.gov Compare Post Acute Care list provided to:: Patient Represenative (must comment) [name])    Discharge Placement                Patient chooses bed at: [facility] [place] Patient to be transferred to facility by: nancy Pita      Discharge Plan and Services Additional resources added to the After Visit Summary for                                       Social Determinants of Health (SDOH) Interventions SDOH Screenings   Depression (PHQ2-9): Low Risk  (11/11/2021)  Tobacco Use: Low Risk  (04/10/2022)     Readmission Risk Interventions    04/09/2020    1:25 PM  Readmission Risk Prevention Plan  Transportation Screening Complete  PCP or Specialist Appt within 3-5 Days Complete  HRI or [place] Complete  Social Work Consult for [place] Planning/Counseling Complete  Palliative Care Screening Not Applicable  Medication Review Press photographer) Complete

## 2022-04-10 NOTE — Progress Notes (Signed)
Mobility Specialist - Progress Note     04/10/22 0800  Mobility  Activity Ambulated with assistance in hallway;Ambulated with assistance in room;Stood at bedside  Level of Assistance Standby assist, set-up cues, supervision of patient - no hands on  Assistive Device Front wheel walker  Distance Ambulated (ft) 45 ft  Activity Response Tolerated well  Mobility Referral Yes  $Mobility charge 1 Mobility   Pt resting in bed on RA upon entry. Pt STS and ambulates to hallway and around room SBA with left forearm platform walker. Pt returned to recliner and left with needs in reach as well as chair alarm activated.   Loma Sender Mobility Specialist 04/10/22, 8:51 AM

## 2022-04-10 NOTE — Discharge Summary (Signed)
Physician Discharge Summary   Patient: Madison Burns MRN: [id number] DOB: [birth year]  Admit date:     04/01/2022  Discharge date: 04/10/22  Discharge Physician: Ezekiel Slocumb   PCP: Einar Pheasant, MD   Recommendations at discharge:   Follow up at [facility] in 1-2 weeks Follow up with Primary Care in 1-2 weeks Repeat CBC and BMP in 1-2 weeks (follow up anemia, sodium level) Follow up with Neurologist as scheduled Monitor right hip/buttock area (hematoma is improving but monitor for improvement).  If increasing pain, swelling, worsening ecchymosis - recommend return to the ED and repeating CT pelvis.    Discharge Diagnoses: Principal Problem:   ABLA (acute blood loss anemia) Active Problems:   Frequent falls   Nondisplaced fracture of shaft of fifth metacarpal bone, left hand, initial encounter for closed fracture   Myasthenia gravis (HCC)   Hypothyroidism   Paroxysmal atrial fibrillation (HCC)   GERD without esophagitis  Resolved Problems:   Hyponatremia   Hypokalemia   Lactic acidosis  Hospital Course:  Madison Burns is a 72 y.o. Caucasian female with medical history significant for myasthenia gravis, GERD, hypertension, dyslipidemia, hypothyroidism, IBS, OSA on CPAP, who presented to the emergency room with a [name] of recurrent falls.  She had a couple falls with subsequent head injury and facial contusion mainly in the nose.  She stated that she tripped and fell.  No presyncope or syncope.  No headache or dizziness or blurred vision.  No paresthesias or focal muscle weakness.  No weakness seizures.  She had subsequent left hand swelling and pain and was found to have fifth metacarpal fracture.  No dysuria, oliguria or hematuria, urgency or frequency or flank pain.   ED Course: When she came to the ER, BP was 98/52 and temperature 97.4 with otherwise normal vital signs.  BP later on was 113/65.  She did not require any oxygen.  ABG was unremarkable.   CMP was remarkable for hyponatremia and hypochloremia, hypokalemia and a BUN of 24 with creatinine 1.23, magnesium 1.6 and high sensitive troponin I was 8.  Lactic acid was 5.2 and later 2.3 and procalcitonin less than 0.1.  CBC showed leukocytosis 19.4 with neutrophilia, anemia that is new.  Coag profile was within normal.  TSH was 4.34 and free T41.29. EKG as reviewed by me : Normal sinus rhythm with a rate of 78 with suspected left atrial enlargement, LVH and prolonged QT interval. Imaging: Portable chest x-ray showed no acute cardiopulmonary disease.  It showed mild right perihilar distortion that is stable.  Left hand x-ray showed acute to subacute nondisplaced fracture involving the proximal shaft of the fifth metacarpal.  Abdominal and pelvic CT scan revealed the following: 1. 12.7 cm intramuscular and subcutaneous hematoma within the right gluteal region. Extensive overlying subcutaneous edema. 2. Stable post treatment change within the anterior mediastinum. 3. Stable post ablated changes within the lower pole of the left kidney. 4. Moderate sigmoid diverticulosis without superimposed acute inflammatory change.    Assessment and Plan:  Acute blood loss anemia: No history of melena or GI bleed.  Hemoglobin dropped to 6.3 on 04/03/2022, presumed due to large subcutaneous hematoma within the gluteal region.  She was given 1 unit of PRBC transfusion.  Hemoglobin improved but dropped to 6.9 again on 04/04/2022 and she was given 1 unit of PRBC transfusion, hemoglobin improved to 8.3 but then slowly dropped to 6.6 again on 04/07/2022, transfused 1 more unit.   FOBT has been ordered twice  but has not been collected yet.   Per patient and her sister, patient had 1 bowel movement on Friday which was regular color, it was not black or bloody.   Very low suspicion for a GI bleed at this time.  Hematoma size was stable on repeat CT pelvis 2/19.   --Monitor H&H every 12 hours. --Transfuse if Hbg <  7.0 --Hbg has stabilized, 8.9 past two mornings --Repeat CBC in 1-2 weeks  Intramuscular and subcutaneous hematoma within the right gluteal region: 12.7 cm in size.  Has edema but no erythema. Large area of ecchymosis is improving. Repeat CT pelvis on 2/19 due to recurrent Hbg drop - hematoma size was stable.   --Monitor area closely.   --If signs of expanding (increased pain, swelling or ecchymosis) - come to ED for repeat CT pelvis and check Hbg leve.   Hypokalemia: Resolved   Acute hyponatremia: Resolved.  Presented with 124 which further dropped to 118.  Workup indicates SIADH.  Desmopressin was reportedly started recently (per sister), few months ago for excessive urination.   --Continue nasal Desmopressin BID --Sodium level normalized to 137 --Was also started on salt tablets, will stop these as they cause fluid retention (has chronic lower extremity edema) --Monitor BMP at follow up in 1-2 weeks --Fluid restriction recommended, max 2 L per day.   --Consider further restriction if needed based on repeat labs.   Dehydration/lactic acidosis: Lactic acidosis resolved with IV hydration.   IV fluids discontinued.  PO intake has been good. Monitor.   --Encourage PO hydration   Hypotension: Resolved.  Patient had hypotension but was asymptomatic, lactic acid normalized.  Not on any antihypertensives.    On diltiazem and amiodarone for atrial fibrillation. --Monitor BP   Frequent falls/generalized weakness: Presumably this was secondary to hyponatremia. --PT and OT and they have recommended SNF.  --Patient and sister agreeable --Discharge to [facility] today for ongoing rehab   Nondisplaced fracture of shaft of fifth metacarpal bone, left hand - The patient had her left hand splinted.   --Ortho consulted on admission, seen by Dr. Sharlet Salina on 04/02/2022. --Recommended maintain ulnar gutter split and follow up in 2 weeks at [facility] for x-ray and possible transition to Velcro  brace --Sister requested Ortho to see prior to d/c and splint be change. Request sent to Ortho on call.     GERD without esophagitis - Continue PPI    Paroxysmal atrial fibrillation  -  continue amiodarone - Not on anticoagulation due to high risk for falls     Hypothyroidism - Continue Synthroid.   Myasthenia gravis - stable - Continue pyridostigmine    Sepsis ruled out - Upon presentation, she had mild tachypnea and significant leukocytosis and significantly elevated lactic acid level, but was afebrile and her procalcitonin was unremarkable as well.  Suspect her presentation due to dehydration.  There is now evidence of infection or sepsis, sepsis ruled out.   Antibiotics discontinued on the morning of 04/02/2022.   Patient has remained stable, afebrile.   No leukocytosis. Monitor clinically -- stable with no signs of infection        Consultants: Orthopedic surgery  Procedures performed: None    Disposition: Skilled nursing facility  Diet recommendation:  Cardiac diet with 2 L per day fluid restriction   DISCHARGE MEDICATION: Allergies as of 04/10/2022   No Known Allergies      Medication List     TAKE these medications    acetaminophen 325 MG tablet Commonly known  as: TYLENOL Take 2 tablets (650 mg total) by mouth every 6 (six) hours as needed for mild pain (or Fever >/= 101).   amiodarone 200 MG tablet Commonly known as: PACERONE Take 1 tablet (200 mg total) by mouth daily.   B-complex with vitamin C tablet Take 1 tablet by mouth every evening.   benzonatate 100 MG capsule Commonly known as: TESSALON TAKE 1 CAPSULE BY MOUTH TWICE DAILY AS NEEDED FOR COUGH   Biotin 10000 MCG Tabs Take 10,000 mcg by mouth daily at 2 PM. (1430)   CITRACAL +D3 PO Take 1 tablet by mouth daily.   desmopressin 0.01 % solution Commonly known as: DDAVP NASAL Place 1 spray (10 mcg total) into the nose 2 (two) times daily.   diltiazem 180 MG 24 hr capsule Commonly  known as: CARDIZEM CD Take 180 mg by mouth daily.   DULoxetine 30 MG capsule Commonly known as: CYMBALTA TAKE 1 CAPSULE BY MOUTH ONCE DAILY   folic acid 1 MG tablet Commonly known as: FOLVITE TAKE 2 TABLETS BY MOUTH ONCE DAILY   Garlic 123XX123 MG Caps Take 1,000 mg by mouth in the morning, at noon, and at bedtime. (0800, 1430, 2000)   levothyroxine 75 MCG tablet Commonly known as: SYNTHROID TAKE 1 TABLET BY MOUTH ONCE DAILY ON AN EMPTY STOMACH. WAIT 30 MINUTES BEFORE TAKING OTHER MEDS.   magnesium hydroxide 400 MG/5ML suspension Commonly known as: MILK OF MAGNESIA Take 30 mLs by mouth daily as needed for mild constipation.   methotrexate 2.5 MG tablet Commonly known as: RHEUMATREX TAKE 4 TABLETS BY MOUTH ONCE A WEEK WITHFOLIC ACID   pantoprazole 40 MG tablet Commonly known as: PROTONIX Take 40 mg by mouth daily.   PROBIOTIC MULTI-ENZYME PO Take 1 capsule by mouth daily. Probiotic Multi-Enzyme Digestive Formula   pyridostigmine 60 MG tablet Commonly known as: MESTINON TAKE 1 TABLET BY MOUTH 3 TIMES DAILY   risperiDONE 1 MG tablet Commonly known as: RISPERDAL Take 1 mg by mouth at bedtime.   tolterodine 4 MG 24 hr capsule Commonly known as: DETROL LA TAKE 1 CAPSULE BY MOUTH ONCE EVERY MORNING   vitamin C 1000 MG tablet Take 1,000 mg by mouth at bedtime.   vitamin E 180 MG (400 UNITS) capsule Take 400 Units by mouth daily at 2 PM. 1430        Contact information for follow-up providers     Renee Harder, MD. Call.   Specialty: Orthopedic Surgery Why: Follow up in 1-2 weeks for left 5th metacarpal fracture.  Seen by Dr. Sharlet Salina in hospital on 2/14.  Recommended continuing splint for the next 2 weeks.  Follow-up at that time with Tessa Lerner PA at [facility] for x-rays and potential transition into a Velcro brace. Contact information: [place] [address] [phone]         Einar Pheasant, MD. Call.   Specialty:  Internal Medicine Why: Hospital follow up in 1-2 weeks Contact information: [id number] [place] [address] [phone]              Contact information for after-discharge care     Destination     [facility] Preferred [facility] .   Service: Skilled Nursing Contact information: [address] [place] [phone]                    Discharge Exam: Danley Danker Weights   04/01/22 2140  Weight: 106 kg  General exam: awake, alert, no acute distress, obese HEENT: moist mucus membranes, hearing grossly normal  Respiratory system: CTAB diminished bases, no wheezes, rales or rhonchi, normal respiratory effort. Cardiovascular system: normal S1/S2, RRR, non-pitting BLE edema is stable.   Gastrointestinal system: soft, NT, ND, no HSM felt, +bowel sounds. Central nervous system: A&O x 3. no Osterberg focal neurologic deficits, normal speech Extremities: ulnar gutter splint on left hand, right lateral hip/buttock ecchymosis is resolving Skin: dry, intact, normal temperature Psychiatry: normal mood, congruent affect, judgement and insight appear normal   Condition at discharge: stable  The results of significant diagnostics from this hospitalization (including imaging, microbiology, ancillary and laboratory) are listed below for reference.   Imaging Studies: CT PELVIS WO CONTRAST  Result Date: 04/07/2022 CLINICAL DATA:  Follow up gluteal hematoma EXAM: CT PELVIS WITHOUT CONTRAST TECHNIQUE: Multidetector CT imaging of the pelvis was performed following the standard protocol without intravenous contrast. RADIATION DOSE REDUCTION: This exam was performed according to the departmental dose-optimization program which includes automated exposure control, adjustment of the mA and/or kV according to patient size and/or use of iterative reconstruction technique. COMPARISON:  04/02/2022 FINDINGS: Urinary Tract:   Unremarkable urinary bladder. Bowel: Diverticulosis and increased stool consistent with constipation. Vascular/Lymphatic: Atheromatous calcifications aorta and its branches. No adenopathy identified. Reproductive:  Status post hysterectomy Other: Right buttock hyperdense collection consistent with hematoma now measures 16 x 14 x 7 cm without significant interval change. Musculoskeletal: Lumbosacral degenerative changes. IMPRESSION: Right buttock hyperdense collection consistent with hematoma without significant interval change. Electronically Signed   By: Sammie Bench M.D.   On: 04/07/2022 10:30   CT CHEST ABDOMEN PELVIS W CONTRAST  Result Date: 04/02/2022 CLINICAL DATA:  Sepsis. History of treated thymoma and renal cell carcinoma. EXAM: CT CHEST, ABDOMEN, AND PELVIS WITH CONTRAST TECHNIQUE: Multidetector CT imaging of the chest, abdomen and pelvis was performed following the standard protocol during bolus administration of intravenous contrast. RADIATION DOSE REDUCTION: This exam was performed according to the departmental dose-optimization program which includes automated exposure control, adjustment of the mA and/or kV according to patient size and/or use of iterative reconstruction technique. CONTRAST:  152m OMNIPAQUE IOHEXOL 300 MG/ML  SOLN COMPARISON:  CT chest 04/29/2021, CT abdomen pelvis 06/15/2019, MRI abdomen 05/02/2021 FINDINGS: CT CHEST FINDINGS Cardiovascular: No significant vascular findings. Normal heart size. No pericardial effusion. Mediastinum/Nodes: Visualized thyroid unremarkable. Stable dystrophic calcification and soft tissue within the anterior mediastinum likely representing post treatment change related to reported thymoma. The esophagus is gas filled suggesting changes of gastroesophageal reflux or esophageal dysmotility. No pathologic adenopathy within the thorax. Lungs/Pleura: Stable cicatricial changes within the right paramediastinal upper lobe in keeping with post radiation  fibrosis. Mild right basilar fibrotic change. Mosaic attenuation within the lungs bilaterally is in keeping with multifocal air trapping related to small airways disease. No confluent pulmonary infiltrate. No pneumothorax or pleural effusion. No central obstructing lesion. Musculoskeletal: Chronic subluxation of the sternoclavicular articulations bilaterally, unchanged. No acute bone abnormality. No lytic or blastic bone lesion. CT ABDOMEN PELVIS FINDINGS Hepatobiliary: No focal liver abnormality is seen. Status post cholecystectomy. No biliary dilatation. Pancreas: Unremarkable. No pancreatic ductal dilatation or surrounding inflammatory changes. Spleen: Unremarkable Adrenals/Urinary Tract: Nodular thickening of the adrenal glands bilaterally is stable since prior examination in keeping with nodular hyperplasia. The kidneys are normal in size and position. Post ablated changes are seen within the posterior lower pole of the left kidney, stable since prior MRI examination. No enhancing intra cortical mass. No hydronephrosis. No intrarenal or ureteral  calculi. The bladder is unremarkable. Stomach/Bowel: Moderate sigmoid diverticulosis. Stomach, small bowel, and large bowel are otherwise unremarkable. No evidence of obstruction or focal inflammation. No free intraperitoneal gas or fluid. Appendix absent. Vascular/Lymphatic: Aortic atherosclerosis. No enlarged abdominal or pelvic lymph nodes. Reproductive: Status post hysterectomy. No adnexal masses. Other: No abdominal wall hernia. There is hyperdense soft tissue infiltrating within the musculature of the gluteus medius and extending laterally into the subcutaneous soft tissues of the right gluteal region in keeping with a intramuscular and subcutaneous hematoma measuring at least 6.0 x 10.3 x 12.7 cm (volume = 410 cm^3). There is extensive overlying subcutaneous edema. Musculoskeletal: No acute bone abnormality. Degenerative changes are seen within the lumbar spine.  IMPRESSION: 1. 12.7 cm intramuscular and subcutaneous hematoma within the right gluteal region. Extensive overlying subcutaneous edema. 2. Stable post treatment change within the anterior mediastinum. 3. Stable post ablated changes within the lower pole of the left kidney. 4. Moderate sigmoid diverticulosis without superimposed acute inflammatory change. Aortic Atherosclerosis (ICD10-I70.0). Electronically Signed   By: Fidela Salisbury M.D.   On: 04/02/2022 01:59   DG Chest 1 View  Result Date: 04/01/2022 CLINICAL DATA:  Fall chest pain EXAM: CHEST  1 VIEW COMPARISON:  11/04/2019, 04/08/2020, chest CT 04/29/2021 FINDINGS: No acute airspace disease or effusion. Normal cardiomediastinal silhouette. No pneumothorax. Mild right parahilar distortion is stable and presumably related to post treatment change. IMPRESSION: No active disease. Electronically Signed   By: Donavan Foil M.D.   On: 04/01/2022 23:10   DG Hips Bilat W or Wo Pelvis 2 Views  Result Date: 04/01/2022 CLINICAL DATA:  Fall EXAM: DG HIP (WITH OR WITHOUT PELVIS) 2V BILAT COMPARISON:  None Available. FINDINGS: Bones are osteopenic. There is no evidence of hip fracture or dislocation. There is no evidence of arthropathy or other focal bone abnormality. There surgical clips in the right lower quadrant. IMPRESSION: No acute fracture or dislocation. Osteopenia. Electronically Signed   By: Ronney Asters M.D.   On: 04/01/2022 23:08   DG Hand Complete Left  Result Date: 04/01/2022 CLINICAL DATA:  Fall EXAM: LEFT HAND - COMPLETE 3+ VIEW COMPARISON:  02/27/2021 FINDINGS: Chronic deformity at the tufts of the third and fourth distal phalanges. Acute to subacute nondisplaced fracture involving the proximal shaft of the fifth metacarpal. No subluxation. Mild degenerative change at the first [facility] joint IMPRESSION: Acute to subacute nondisplaced fracture involving the proximal shaft of the fifth metacarpal. Electronically Signed   By: Donavan Foil M.D.   On:  04/01/2022 23:08   CT Head Wo Contrast  Result Date: 04/01/2022 CLINICAL DATA:  Head trauma, minor (Age >= 65y); Neck trauma (Age >= 65y). Myasthenia gravis, multiple falls EXAM: CT HEAD WITHOUT CONTRAST CT CERVICAL SPINE WITHOUT CONTRAST TECHNIQUE: Multidetector CT imaging of the head and cervical spine was performed following the standard protocol without intravenous contrast. Multiplanar CT image reconstructions of the cervical spine were also generated. RADIATION DOSE REDUCTION: This exam was performed according to the departmental dose-optimization program which includes automated exposure control, adjustment of the mA and/or kV according to patient size and/or use of iterative reconstruction technique. COMPARISON:  02/27/2021 FINDINGS: CT HEAD FINDINGS Brain: Normal anatomic configuration. Parenchymal volume loss is commensurate with the patient's age. Mild periventricular white matter changes are present likely reflecting the sequela of small vessel ischemia. No abnormal intra or extra-axial mass lesion or fluid collection. No abnormal mass effect or midline shift. No evidence of acute intracranial hemorrhage or infarct. Ventricular size is normal. Cerebellum unremarkable.  Vascular: No asymmetric hyperdense vasculature at the skull base. Skull: Intact Sinuses/Orbits: Paranasal sinuses are clear. Orbits are unremarkable. Other: Mastoid air cells and middle ear cavities are clear. CT CERVICAL SPINE FINDINGS Alignment: Normal. Skull base and vertebrae: Craniocervical alignment is normal. The atlantodental interval is not widened. No acute fracture of the cervical spine. Vertebral body height is preserved. Soft tissues and spinal canal: No prevertebral fluid or swelling. No visible canal hematoma. Disc levels: There is intervertebral disc space narrowing and endplate remodeling at QA348G in keeping with changes of diffuse moderate to severe degenerative disc disease. Prevertebral soft tissues are not thickened  on sagittal reformats. No high-grade canal stenosis or neuroforaminal narrowing. Upper chest: Negative. Other: None IMPRESSION: 1. No acute intracranial abnormality. No calvarial fracture. 2. No acute fracture or listhesis of the cervical spine. Electronically Signed   By: Fidela Salisbury M.D.   On: 04/01/2022 22:34   CT Cervical Spine Wo Contrast  Result Date: 04/01/2022 CLINICAL DATA:  Head trauma, minor (Age >= 65y); Neck trauma (Age >= 65y). Myasthenia gravis, multiple falls EXAM: CT HEAD WITHOUT CONTRAST CT CERVICAL SPINE WITHOUT CONTRAST TECHNIQUE: Multidetector CT imaging of the head and cervical spine was performed following the standard protocol without intravenous contrast. Multiplanar CT image reconstructions of the cervical spine were also generated. RADIATION DOSE REDUCTION: This exam was performed according to the departmental dose-optimization program which includes automated exposure control, adjustment of the mA and/or kV according to patient size and/or use of iterative reconstruction technique. COMPARISON:  02/27/2021 FINDINGS: CT HEAD FINDINGS Brain: Normal anatomic configuration. Parenchymal volume loss is commensurate with the patient's age. Mild periventricular white matter changes are present likely reflecting the sequela of small vessel ischemia. No abnormal intra or extra-axial mass lesion or fluid collection. No abnormal mass effect or midline shift. No evidence of acute intracranial hemorrhage or infarct. Ventricular size is normal. Cerebellum unremarkable. Vascular: No asymmetric hyperdense vasculature at the skull base. Skull: Intact Sinuses/Orbits: Paranasal sinuses are clear. Orbits are unremarkable. Other: Mastoid air cells and middle ear cavities are clear. CT CERVICAL SPINE FINDINGS Alignment: Normal. Skull base and vertebrae: Craniocervical alignment is normal. The atlantodental interval is not widened. No acute fracture of the cervical spine. Vertebral body height is  preserved. Soft tissues and spinal canal: No prevertebral fluid or swelling. No visible canal hematoma. Disc levels: There is intervertebral disc space narrowing and endplate remodeling at QA348G in keeping with changes of diffuse moderate to severe degenerative disc disease. Prevertebral soft tissues are not thickened on sagittal reformats. No high-grade canal stenosis or neuroforaminal narrowing. Upper chest: Negative. Other: None IMPRESSION: 1. No acute intracranial abnormality. No calvarial fracture. 2. No acute fracture or listhesis of the cervical spine. Electronically Signed   By: Fidela Salisbury M.D.   On: 04/01/2022 22:34   Nocturnal polysomnography  Result Date: 03/24/2022 [name], MD     03/28/2022  6:10 PM  [facility] POLYSOMNOGRAPHY  INTERPRETATION REPORT STUDY DATE:  03/24/2022  PATIENT NAME:  Azriel Mckeller. Vandivier        DATE OF BIRTH:  03/19/1950 PATIENT ID:  [id number]    TYPE OF STUDY:  PSG READING PHYSICIAN: [name], MD REFERRED BY: GNA  [name], NP SCORING TECHNICIAN: Richard Miu, RPSGT HISTORY:  This 72 year-old Female patient was seen on  08/23/21 and has a history of mental retardation, is living with her older sister, [name]. She has Myasthenia gravis ,  seizures and atrial fibrillation, has been diagnosed  with  kidney cancer - finished her chemotherapy. She reportedly snores and is a light sleeper, has enuresis. Enuresis started after a fall in 04/07/21 and there was a concern of nocturnal seizures but responded to desmopressin. She had frequent falls in 2023. A previous HST had not shown any apnea and [name], her sister, wanted to have a PSG- in lab study was scheduled for 09-2021 but patient contracted Covid and then forgot to reschedule. She gained some weight since her gait has been affected by MG and she has become less active, about 20 pounds. ADDITIONAL INFORMATION:  The Epworth Sleepiness Scale endorsed at 3 /24 points (scores above or equal  to 10 are suggestive of hypersomnolence). FSS endorsed at:  n/a  /63 points. Sister [name] is present during the sleep test.  Height: 64 in Weight: 225 lbs (BMI 38) Neck Size: 16 in MEDICATIONS: Folic Acid, Vitamin E, Detrol, Risperdal for sleep, Mestinon, Protonix, Probiotic, Synthroid, Rheumatrex, Garlic, Cymbalta, Desmopressin, Vitamin D, Tessalon, Biotin, B Complex, Vitamin C, Pacerone, Tylenol TECHNICAL DESCRIPTION: A registered sleep technologist ( RPSGT)  was in attendance for the duration of the recording.  Data collection, scoring, video monitoring, and reporting were performed in compliance with the AASM Manual for the Scoring of Sleep and Associated Events; (Hypopnea is scored based on the criteria listed in Section VIII D. 1b in the AASM Manual V2.6 using a 4% oxygen desaturation rule or Hypopnea is scored based on the criteria listed in Section VIII D. 1a in the AASM Manual V2.6 using 3% oxygen desaturation and /or arousal rule). SLEEP CONTINUITY AND SLEEP ARCHITECTURE:  Lights-out was at 21:13: and lights-on at  05:06:, with  7.9 hours of recording time, 460 minutes . Total sleep time ( TST) was 161.0 minutes with a decreased sleep efficiency at 34.0%. There was no REM sleep - Sleep latency was increased at 149.0 minutes.  Of the total sleep time, the percentage of stage N1 sleep was 14.6%, stage N2 sleep was 85%, stage N3 sleep was 0.0%, and REM sleep was 0.0%. BODY POSITION:  TST was divided between the following sleep positions: 100.0% supine; 0.0% lateral; 0% prone.  There were 17 awakenings (i.e., transitions to Stage W from any sleep stage), and 46 total stage transitions. Wake after sleep onset (WASO) time accounted for 163 minutes. RESPIRATORY MONITORING:  Based on AASM criteria (using a 3% oxygen desaturation and /or arousal rule for scoring hypopneas), there were 3 apneas (3 obstructive; 0 central; 0 mixed), and 10 hypopneas. The Apnea index was 1.1/h. Hypopnea index was 3.7/h. The AHI  (apnea-hypopnea index) was 4.8/h overall (4.8 supine, 0 non-supine; 0.0 REM, 0.0 supine REM). Mild to moderate snoring was noted but did not cause arousals.  There were 6 respiratory effort-related arousals (RERAs). The RDI is 7.2/h. OXIMETRY: Oxyhemoglobin Saturation Nadir during sleep was at  84%) from a mean of 93%.  Of the Total sleep time (TST) there was   hypoxemia (=<89%) present for 0.5 minutes, or 0.3% of total sleep time. LIMB MOVEMENTS: There were 0 periodic limb movements of sleep (0.0/h) AROUSAL: There were 26 arousals in total, for an arousal index of 10 /hour.  Of these, 1 was identified as respiratory-related arousals (0 /h), 0 were PLM-related arousals (0 /h), and 35 were non-specific arousals (13 /h) and 10 were microarousals. There were 0 occurrences of Cheyne Stokes breathing. EEG:  PSG EEG was of normal amplitude and frequency, with symmetric manifestation of sleep stages. EKG: The electrocardiogram documented regular rhythm.  The average heart rate during sleep was 71 bpm.  The heart rate during sleep varied between a minimum of 65and  a maximum of  75 bpm. AUDIO and VIDEO: IMPRESSION: 1) Sleep disordered breathing was present at a clinically irrelevant degree with an AHI of less than 5/h.   2) Periodic limb movements were not present. No enuresis occurred and no dream enactment was captured. 3) Sleep efficiency was very poor, unclear if this night was representative or not. The patient uses Risperdal to aid her sleep. Total sleep time was reduced at 161.0 minutes.  Sleep efficiency was decreased at 34.0%.  RECOMMENDATIONS: based on these PSG results, there is snoring present while sleeping supine but not enough apnea is present to warrant intervention or therapy. Snoring can be highly dependent on body weight and weight loss is recommended.  If the patient is able to sleep in non-supine sleep, her snoring will also be reduced. [name], MD [facility] Fellow of the AASM and  AAN,  Diplomate of the ABPN and ABSM.   Name: Malaun, Maceda BMI: [id number] Physician: [name], MD ID: [id number] Height: 64.0 in Technician: Richard Miu, RPSGT Sex: Female Weight: 225.0 lb Record: [id number] Age: 79 [1950-07-23] Date: 03/24/2022   Medical & Medication History   Kathrynn returns for follow up for MG and questionable OSA. She was last seen by Dr Brett Fairy 08/2021. In lab PSA ordered as previous HST had not shown any significant sleep apnea. Sister was concerned about witnessed snoring, apneas and enuresis. She was started on DDAVP nasal spray. Since, she reports doing fairly well. MG symptoms seem to be well controlled. She continues mestinon 75m TID, methotrexate [id number] and folic acid 257mdaily. Gait is stable. She is not using CPAP. NaIzora Galasister, is still concerned about her snoring. They were scheduled for in lab sleep study in 09/2021 but came down with Covid and have forgotten to call to reschedule. She is not as active. She has gained about 15-20lbs. 09/04/2021 CD: ReSUJEILY BROSCHARTs a [age].o. female with mental retardation, living with her sister.Here for 6 month MG and sleep f/u. Pt continues to snore and has started to wet the bed ever since her last fall, 2/19. Pt is such a light sleeper, currently taking risperidone. Had 3 falls in January and since has been wetting the bed. Miss ReKassidias been diagnosed with renal cell cancer and began having enuresis- her Myasthenia has taken a turn for the worse. She is presenting with ptosis , neck weakness and left shoulder slump. She is no longer able to walk unassisted, presents stooped. Her voice is hoarse. Her mood is good, she lost weight , BMI 36.73 kg/m2. She completed her cancer treatments. She brought me another drawing- I have a collection of the teddy brea drawings since 15 years ago. She is awaiting a CT of the kidney or MRI. Calculated pAHI (per hour): The calculated AHI was only 3.5/h there was no breakdown in rem sleep and  non-REM sleep possible. No data of sleep position or snoring were provided either. O2 Saturation Range (%): Oxygen saturation ranged between a nadir of 74% and a maximum saturation of 97% with a mean oxygenation at 93% SPO2. Time spent under 89% oxygen saturation amounted to 5.2 minutes or 1.4% of sleep time. Folic Acid, Vitamin E, Detrol, Risperdal, Mestinon, Protonix, Probiotic, Snythroid, Rheumatrex, Garlic, Cymbalta, Desmopressin, Vitamin D, Tessalon, Biotin, B Complex, Vitamin C, Pacerone, Tylenol  Sleep Disorder  Comments  The patient came into the lab for a PSG. She took Risperdal prior to start of sleep study. Prior hst on 10/15/20 with our sleep lab. The patient also had sleep studies completed in 2016. The patient's sister insisted she needed to be present during the sleep study. The patient had very little sleep. Per patient's sister the patient is a very light sleeper. Mild to moderate snoring. The patient had one restroom break. EKG kept in NSR. Respiratory events scored with a 4% desat. Slept supine.   Lights out: 09:13:08 PM Lights on: 05:06:41 AM Time Total Supine Side Prone Upright Recording (TRT) 7h 53.31m7h 53.531mh 0.33m772m 0.33m 59m0.33m S19mp (TST) 2h 41.33m 2h1m.33m 0h 33mm 0h 0572m 0h 0.34mLatenc81m1 N2 N3 REM Onset Per. Slp. Eff. Actual 2h 29.33m 2h 35.058mh 0.33m 733m0.33m 2733m9.33m 2372m3.72m 3452m% Stg D51mWake N1 N2 N3 REM Total 163.5 23.5 137.5 0.0 0.0 Supine 163.5 23.5 137.5 0.0 0.0 Side 0.0 0.0 0.0 0.0 0.0 Prone 0.0 0.0 0.0 0.0 0.0 Upright 0.0 0.0 0.0 0.0 0.0  Stg % Wake N1 N2 N3 REM Total 50.4 14.6 85.4 0.0 0.0 Supine 50.4 14.6 85.4 0.0 0.0 Side 0.0 0.0 0.0 0.0 0.0 Prone 0.0 0.0 0.0 0.0 0.0 Upright 0.0 0.0 0.0 0.0 0.0  Apnea Summary Sub Supine Side Prone Upright Total 3 Total 3 3 0 0 0   REM 0 0 0 0 0   NREM 3 3 0 0 0 Obs 3 REM 0 0 0 0 0   NREM 3 3 0 0 0 Mix 0 REM 0 0 0 0 0   NREM 0 0 0 0 0 Cen 0 REM 0 0 0 0 0   NREM 0 0 0 0 0 Rera Summary Sub Supine Side Prone Upright Total 0 Total 0 0 0 0 0    REM 0 0 0 0 0   NREM 0 0 0 0 0  Hypopnea Summary Sub Supine Side Prone Upright Total 10 Total 10 10 0 0 0   REM 0 0 0 0 0   NREM 10 10 0 0 0 4% Hypopnea Summary Sub Supine Side Prone Upright Total (4%) 9 Total 9 9 0 0 0   REM 0 0 0 0 0   NREM 9 9 0 0 0  AHI Total Obs Mix Cen 4.84 Apnea 1.12 1.12 0.00 0.00  Hypopnea 3.73 -- -- -- 4.47 Hypopnea (4%) 3.35 -- -- --  Total Supine Side Prone Upright Position AHI 4.84 4.84 0.00 0.00 0.00 REM AHI 0.00  NREM AHI 4.84  Position RDI 4.84 4.84 0.00 0.00 0.00 REM RDI 0.00  NREM RDI 4.84  4% Hypopnea Total Supine Side Prone Upright Position AHI (4%) 4.47 4.47 0.00 0.00 0.00 REM AHI (4%) 0.00  NREM AHI (4%) 4.47  Position RDI (4%) 4.47 4.47 0.00 0.00 0.00 REM RDI (4%) 0.00  NREM RDI (4%) 4.47  Desaturation Information Threshold: 2% <100% <90% <80% <70% <60% <50% <40% Supine 121.0 5.0 0.0 0.0 0.0 0.0 0.0 Side 0.0 0.0 0.0 0.0 0.0 0.0 0.0 Prone 0.0 0.0 0.0 0.0 0.0 0.0 0.0 Upright 0.0 0.0 0.0 0.0 0.0 0.0 0.0 Total 121.0 5.0 0.0 0.0 0.0 0.0 0.0 Index 22.4 0.9 0.0 0.0 0.0 0.0 0.0 Threshold: 3% <100% <90% <80% <70% <60% <50% <40% Supine 16.0 5.0 0.0 0.0 0.0 0.0 0.0 Side 0.0 0.0 0.0 0.0 0.0 0.0 0.0 Prone 0.0 0.0 0.0 0.0 0.0 0.0 0.0  Upright 0.0 0.0 0.0 0.0 0.0 0.0 0.0 Total 16.0 5.0 0.0 0.0 0.0 0.0 0.0 Index 3.0 0.9 0.0 0.0 0.0 0.0 0.0 Threshold: 4% <100% <90% <80% <70% <60% <50% <40% Supine 12.0 5.0 0.0 0.0 0.0 0.0 0.0 Side 0.0 0.0 0.0 0.0 0.0 0.0 0.0 Prone 0.0 0.0 0.0 0.0 0.0 0.0 0.0 Upright 0.0 0.0 0.0 0.0 0.0 0.0 0.0 Total 12.0 5.0 0.0 0.0 0.0 0.0 0.0 Index 2.2 0.9 0.0 0.0 0.0 0.0 0.0 Threshold: 4% <100% <90% <80% <70% <60% <50% <40% Supine 12 5 0 0 0 0 0 Side 0 0 0 0 0 0 0 Prone 0 0 0 0 0 0 0 Upright 0 0 0 0 0 0 0 Total 12 5 0 0 0 0 0  Awakening/Arousal Information # of Awakenings 17 Wake after sleep onset 163.14mWake after persistent sleep 157.535mrousal Assoc. Arousals Index Apneas 0 0.0 Hypopneas 1 0.4 Leg Movements 0 0.0 Snore 0 0.0 PTT Arousals 0 0.0 Spontaneous 35 13.0 Total 36  13.4 Leg Movement Information PLMS LMs Index Total LMs during PLMS 0 0.0 LMs w/ Microarousals 0 0.0 LM LMs Index w/ Microarousal 0 0.0 w/ Awakening 0 0.0 w/ Resp Event 0 0.0 Spontaneous 0 0.0 Total 0 0.0  Desaturation threshold setting: 4% Minimum desaturation setting: 10 seconds SaO2 nadir: 84% The longest event was a 43 sec obstructive Hypopnea with a minimum SaO2 of 90%. The lowest SaO2 was 84% associated with a 22 sec obstructive Hypopnea. EKG Rates EKG Avg Max Min Awake 72 97 67 Asleep 71 75     Microbiology: Results for orders placed or performed during the hospital encounter of 04/01/22  Blood Culture (routine x 2)     Status: None   Collection Time: 04/01/22 11:30 PM   Specimen: BLOOD  Result Value Ref Range Status   Specimen Description BLOOD  LEFT ARM  Final   Special Requests   Final    BOTTLES DRAWN AEROBIC AND ANAEROBIC Blood Culture results may not be optimal due to an inadequate volume of blood received in culture bottles   Culture   Final    NO GROWTH 5 DAYS Performed at [facility] [place] [address]  Report Status 04/06/2022 FINAL  Final  Blood Culture (routine x 2)     Status: None   Collection Time: 04/01/22 11:40 PM   Specimen: BLOOD  Result Value Ref Range Status   Specimen Description BLOOD  RIGHT ARM  Final   Special Requests   Final    BOTTLES DRAWN AEROBIC AND ANAEROBIC Blood Culture adequate volume   Culture   Final    NO GROWTH 5 DAYS [facility] [place] [id number]  Report Status 04/06/2022 FINAL  Final  Resp panel by RT-PCR (RSV, Flu A&B, Covid) Anterior Nasal Swab     Status: None   Collection Time: 04/01/22 11:44 PM   Specimen: Anterior Nasal Swab  Result Value Ref Range Status   SARS Coronavirus 2 by RT PCR NEGATIVE NEGATIVE Final    Comment: (NOTE) SARS-CoV-2 target nucleic acids are NOT DETECTED.  The SARS-CoV-2 RNA is generally detectable in upper  respiratory specimens during the acute phase of infection. The lowest concentration of SARS-CoV-2 viral copies this assay can detect is 138 copies/mL. A negative result does not preclude SARS-Cov-2 infection and should not be used as the sole basis for treatment or other patient management decisions. A negative  result may occur with  improper specimen collection/handling, submission of specimen other than nasopharyngeal swab, presence of viral mutation(s) within the areas targeted by this assay, and inadequate number of viral copies(<138 copies/mL). A negative result must be combined with clinical observations, patient history, and epidemiological information. The expected result is Negative.  Fact Sheet for Patients:  EntrepreneurPulse.com.au  Fact Sheet for Healthcare Providers:  IncredibleEmployment.be  This test is no t yet approved or cleared by the [place] FDA and  has been authorized for detection and/or diagnosis of SARS-CoV-2 by FDA under an Emergency Use Authorization (EUA). This EUA will remain  in effect (meaning this test can be used) for the duration of the COVID-19 declaration under Section 564(b)(1) of the Act, 21 U.S.C.section 360bbb-3(b)(1), unless the authorization is terminated  or revoked sooner.       Influenza A by PCR NEGATIVE NEGATIVE Final   Influenza B by PCR NEGATIVE NEGATIVE Final    Comment: (NOTE) The Xpert Xpress SARS-CoV-2/FLU/RSV plus assay is intended as an aid in the diagnosis of influenza from Nasopharyngeal swab specimens and should not be used as a sole basis for treatment. Nasal washings and aspirates are unacceptable for Xpert Xpress SARS-CoV-2/FLU/RSV testing.  Fact Sheet for Patients: EntrepreneurPulse.com.au  Fact Sheet for Healthcare Providers: IncredibleEmployment.be  This test is not yet approved or cleared by the [place] FDA and has been  authorized for detection and/or diagnosis of SARS-CoV-2 by FDA under an Emergency Use Authorization (EUA). This EUA will remain in effect (meaning this test can be used) for the duration of the COVID-19 declaration under Section 564(b)(1) of the Act, 21 U.S.C. section 360bbb-3(b)(1), unless the authorization is terminated or revoked.     Resp Syncytial Virus by PCR NEGATIVE NEGATIVE Final    Comment: (NOTE) Fact Sheet for Patients: EntrepreneurPulse.com.au  Fact Sheet for Healthcare Providers: IncredibleEmployment.be  This test is not yet approved or cleared by the [place] FDA and has been authorized for detection and/or diagnosis of SARS-CoV-2 by FDA under an Emergency Use Authorization (EUA). This EUA will remain in effect (meaning this test can be used) for the duration of the COVID-19 declaration under Section 564(b)(1) of the Act, 21 U.S.C. section 360bbb-3(b)(1), unless the authorization is terminated or revoked.  Performed at [facility], [address] [place], [place] [address]   Urine Culture (for pregnant, neutropenic or urologic patients or patients with an indwelling urinary catheter)     Status: None   Collection Time: 04/02/22  3:15 AM   Specimen: Urine, Clean Catch  Result Value Ref Range Status   Specimen Description   Final    URINE, CLEAN CATCH Performed at [facility], [address] [place], [place] [address]    Special Requests   Final    NONE Performed at [facility], [address], [place], [place] [address]    Culture   Final    NO GROWTH Performed at [facility], [place] [address] [place], [place] [address]    Report Status 04/03/2022 FINAL  Final    Labs: CBC: Recent Labs  Lab 04/04/22 0357 04/04/22 2011 04/05/22 0613 04/05/22 1704 04/06/22 1651 04/07/22 0505 04/07/22 1821 04/08/22 0330 04/08/22 1330 04/09/22 0436 04/10/22 0806  WBC 6.6  --  9.2   <  > 7.3 7.2  --  6.7  --  10.3 8.1  NEUTROABS 4.7  --  7.6  --   --   --   --  4.9  --  8.7*  --   HGB 6.9*   < > 7.4*   < > 7.3* 6.6* 8.9* 8.0* 9.8* 8.9* 8.9*  HCT 20.9*   < > 21.3*   < > 22.4* 20.9* 28.4* 25.2* 31.5* 27.9* 28.1*  MCV 88.9  --  85.5   < > 91.4 92.5  --  93.3  --  92.7 92.4  PLT 122*  --  150   < > 206 216  --  247  --  291 284   < > = values in this interval not displayed.   Basic Metabolic Panel: Recent Labs  Lab 04/05/22 0613 04/05/22 1110 04/06/22 0611 04/06/22 1651 04/07/22 0505 04/08/22 0330 04/09/22 0436  NA 120*   < > 132* 131* 133* 137 137  K 4.3  --  4.3  --  4.3 4.1 4.1  CL 91*  --  102  --  100 103 101  CO2 23  --  25  --  25 27 28  $ GLUCOSE 131*  --  116*  --  99 93 112*  BUN 9  --  12  --  17 15 15  $ CREATININE 0.74  --  0.79  --  0.88 0.82 0.77  CALCIUM 7.5*  --  7.7*  --  7.4* 7.8* 8.3*  MG  --   --   --   --   --   --  1.8   < > = values in this interval not displayed.   Liver Function Tests: No results for input(s): "AST", "ALT", "ALKPHOS", "BILITOT", "PROT", "ALBUMIN" in the last 168 hours. CBG: No results for input(s): "GLUCAP" in the last 168 hours.  Discharge time spent: greater than 30 minutes.  Signed: Ezekiel Slocumb, DO [facility] 04/10/2022

## 2022-04-10 NOTE — Progress Notes (Signed)
Report called to [facility] RN at [facility]. Patient with be leaving via car with sister.

## 2022-04-11 DIAGNOSIS — K219 Gastro-esophageal reflux disease without esophagitis: Secondary | ICD-10-CM | POA: Diagnosis not present

## 2022-04-11 DIAGNOSIS — S62357D Nondisplaced fracture of shaft of fifth metacarpal bone, left hand, subsequent encounter for fracture with routine healing: Secondary | ICD-10-CM | POA: Diagnosis not present

## 2022-04-11 DIAGNOSIS — I480 Paroxysmal atrial fibrillation: Secondary | ICD-10-CM | POA: Diagnosis not present

## 2022-04-11 DIAGNOSIS — M6281 Muscle weakness (generalized): Secondary | ICD-10-CM | POA: Diagnosis not present

## 2022-04-14 ENCOUNTER — Encounter: Payer: Self-pay | Admitting: *Deleted

## 2022-04-14 DIAGNOSIS — S62357D Nondisplaced fracture of shaft of fifth metacarpal bone, left hand, subsequent encounter for fracture with routine healing: Secondary | ICD-10-CM | POA: Diagnosis not present

## 2022-04-14 DIAGNOSIS — M6281 Muscle weakness (generalized): Secondary | ICD-10-CM | POA: Diagnosis not present

## 2022-04-14 DIAGNOSIS — I480 Paroxysmal atrial fibrillation: Secondary | ICD-10-CM | POA: Diagnosis not present

## 2022-04-14 DIAGNOSIS — M7981 Nontraumatic hematoma of soft tissue: Secondary | ICD-10-CM | POA: Diagnosis not present

## 2022-04-14 NOTE — Patient Outreach (Signed)
Cherryl Pontoriero DOB 05-Jul-1950 MRN: [id number]  Orientation with Natividad Brood, [facility] Liaison for review.  Location: [facility] [place] [facility] [ACO] Patient:[facility] ([place])   Primary Care Provider: Einar Pheasant, MD [place] [facility] at [place]: [facility] RN made aware of pt's discharge disposition to SNF ([facility]).  [place] does not replace or interfere with any arrangements made by the Inpatient Transition of Care team.  Raina Mina, RN Care Management Coordinator Campus Office [phone]

## 2022-04-18 DIAGNOSIS — E441 Mild protein-calorie malnutrition: Secondary | ICD-10-CM | POA: Diagnosis not present

## 2022-04-18 DIAGNOSIS — E871 Hypo-osmolality and hyponatremia: Secondary | ICD-10-CM | POA: Diagnosis not present

## 2022-04-18 DIAGNOSIS — S62357D Nondisplaced fracture of shaft of fifth metacarpal bone, left hand, subsequent encounter for fracture with routine healing: Secondary | ICD-10-CM | POA: Diagnosis not present

## 2022-04-18 DIAGNOSIS — M7981 Nontraumatic hematoma of soft tissue: Secondary | ICD-10-CM | POA: Diagnosis not present

## 2022-04-22 ENCOUNTER — Other Ambulatory Visit: Payer: Self-pay | Admitting: *Deleted

## 2022-04-22 DIAGNOSIS — S41102A Unspecified open wound of left upper arm, initial encounter: Secondary | ICD-10-CM | POA: Diagnosis not present

## 2022-04-22 NOTE — Patient Outreach (Signed)
Per [facility] Mrs. Rood resides in [facility]. Screening for potential [facility] care coordination services as benefit of health plan and Primary Care Provider.   Secure communication sent to [facility] social worker team to inquire about transition plans and potential [facility] needs.   Will continue to follow.    Marthenia Rolling, MSN, RN,BSN [place] Acute Care Coordinator [phone] (Direct dial)

## 2022-04-25 DIAGNOSIS — E871 Hypo-osmolality and hyponatremia: Secondary | ICD-10-CM | POA: Diagnosis not present

## 2022-04-25 DIAGNOSIS — S62357D Nondisplaced fracture of shaft of fifth metacarpal bone, left hand, subsequent encounter for fracture with routine healing: Secondary | ICD-10-CM | POA: Diagnosis not present

## 2022-04-25 DIAGNOSIS — M6281 Muscle weakness (generalized): Secondary | ICD-10-CM | POA: Diagnosis not present

## 2022-04-25 DIAGNOSIS — M7981 Nontraumatic hematoma of soft tissue: Secondary | ICD-10-CM | POA: Diagnosis not present

## 2022-04-25 DIAGNOSIS — S62347D Nondisplaced fracture of base of fifth metacarpal bone. left hand, subsequent encounter for fracture with routine healing: Secondary | ICD-10-CM | POA: Diagnosis not present

## 2022-04-28 ENCOUNTER — Telehealth: Payer: Self-pay | Admitting: *Deleted

## 2022-04-28 ENCOUNTER — Other Ambulatory Visit: Payer: Self-pay | Admitting: *Deleted

## 2022-04-28 DIAGNOSIS — I10 Essential (primary) hypertension: Secondary | ICD-10-CM

## 2022-04-28 NOTE — Progress Notes (Signed)
  Care Coordination   Note   04/28/2022 Name: TIANNE PLOTT MRN: [id number] DOB: 19-Jan-1951  [name] is a 72 y.o. year old female who sees Einar Pheasant, MD for primary care. I reached out to [name] by phone today to offer care coordination services.  Ms. Klinker was given information about Care Coordination services today including:   The Care Coordination services include support from the care team which includes your Nurse Coordinator, Clinical Social Worker, or Pharmacist.  The Care Coordination team is here to help remove barriers to the health concerns and goals most important to you. Care Coordination services are voluntary, and the patient may decline or stop services at any time by request to their care team member.   Care Coordination Consent Status: Patient agreed to services and verbal consent obtained.   Follow up plan:  Telephone appointment with care coordination team member scheduled for:  05/06/2022  Encounter Outcome:  Pt. Scheduled from referral   Julian Hy, [place] Direct Dial: [phone]

## 2022-04-28 NOTE — Patient Outreach (Signed)
Per [facility], Ms. Hunt discharged from [facility] on 04/25/22. Screening for potential [place] care coordination services as benefit of health plan and PCP.   Confirmed with LaToya, [facility] social worker, [place] home health was arranged.  Will refer to [place] care coordination team. Has history of falls, Myasthenia Gravis, HTN, dyslipidemia.   Marthenia Rolling, MSN, RN,BSN [place] Acute Care Coordinator [phone] (Direct dial)

## 2022-05-01 DIAGNOSIS — L89626 Pressure-induced deep tissue damage of left heel: Secondary | ICD-10-CM | POA: Diagnosis not present

## 2022-05-01 DIAGNOSIS — G4733 Obstructive sleep apnea (adult) (pediatric): Secondary | ICD-10-CM | POA: Diagnosis not present

## 2022-05-01 DIAGNOSIS — K760 Fatty (change of) liver, not elsewhere classified: Secondary | ICD-10-CM | POA: Diagnosis not present

## 2022-05-01 DIAGNOSIS — R911 Solitary pulmonary nodule: Secondary | ICD-10-CM | POA: Diagnosis not present

## 2022-05-01 DIAGNOSIS — M7981 Nontraumatic hematoma of soft tissue: Secondary | ICD-10-CM | POA: Diagnosis not present

## 2022-05-01 DIAGNOSIS — E869 Volume depletion, unspecified: Secondary | ICD-10-CM | POA: Diagnosis not present

## 2022-05-01 DIAGNOSIS — I480 Paroxysmal atrial fibrillation: Secondary | ICD-10-CM | POA: Diagnosis not present

## 2022-05-01 DIAGNOSIS — S62357D Nondisplaced fracture of shaft of fifth metacarpal bone, left hand, subsequent encounter for fracture with routine healing: Secondary | ICD-10-CM | POA: Diagnosis not present

## 2022-05-01 DIAGNOSIS — E871 Hypo-osmolality and hyponatremia: Secondary | ICD-10-CM | POA: Diagnosis not present

## 2022-05-01 DIAGNOSIS — Z85238 Personal history of other malignant neoplasm of thymus: Secondary | ICD-10-CM | POA: Diagnosis not present

## 2022-05-01 DIAGNOSIS — Z85828 Personal history of other malignant neoplasm of skin: Secondary | ICD-10-CM | POA: Diagnosis not present

## 2022-05-01 DIAGNOSIS — E039 Hypothyroidism, unspecified: Secondary | ICD-10-CM | POA: Diagnosis not present

## 2022-05-01 DIAGNOSIS — E7800 Pure hypercholesterolemia, unspecified: Secondary | ICD-10-CM | POA: Diagnosis not present

## 2022-05-01 DIAGNOSIS — K219 Gastro-esophageal reflux disease without esophagitis: Secondary | ICD-10-CM | POA: Diagnosis not present

## 2022-05-01 DIAGNOSIS — G7000 Myasthenia gravis without (acute) exacerbation: Secondary | ICD-10-CM | POA: Diagnosis not present

## 2022-05-01 DIAGNOSIS — N3281 Overactive bladder: Secondary | ICD-10-CM | POA: Diagnosis not present

## 2022-05-01 DIAGNOSIS — I10 Essential (primary) hypertension: Secondary | ICD-10-CM | POA: Diagnosis not present

## 2022-05-01 DIAGNOSIS — S62347D Nondisplaced fracture of base of fifth metacarpal bone. left hand, subsequent encounter for fracture with routine healing: Secondary | ICD-10-CM | POA: Diagnosis not present

## 2022-05-01 DIAGNOSIS — K589 Irritable bowel syndrome without diarrhea: Secondary | ICD-10-CM | POA: Diagnosis not present

## 2022-05-01 DIAGNOSIS — Z85528 Personal history of other malignant neoplasm of kidney: Secondary | ICD-10-CM | POA: Diagnosis not present

## 2022-05-05 ENCOUNTER — Encounter: Payer: Self-pay | Admitting: Internal Medicine

## 2022-05-05 DIAGNOSIS — S62309A Unspecified fracture of unspecified metacarpal bone, initial encounter for closed fracture: Secondary | ICD-10-CM | POA: Insufficient documentation

## 2022-05-06 ENCOUNTER — Ambulatory Visit: Payer: Self-pay

## 2022-05-06 NOTE — Patient Instructions (Signed)
Visit Information  Thank you for taking time to visit with me today. Please don't hesitate to contact me if I can be of assistance to you.   Following are the goals we discussed today:   Goals Addressed             This Visit's Progress    Post hospital follow up / management       Interventions Today    Flowsheet Row Most Recent Value  Chronic Disease   Chronic disease during today's visit Other  [Falls, myasthenia gravis]  General Interventions   General Interventions Discussed/Reviewed General Interventions Discussed, Doctor Visits, Vaccines  [Evaluation of current treatment plan related to falls/ Myesthenia gravis and pateints adherence to plan as established by provider. Confirmed with sister patients home PT/OT services started. Advised to request referral for HHA from OT/PT.]  Vaccines Shingles  [Discussed importance of having 2nd shingles vaccine.]  Doctor Visits Discussed/Reviewed Doctor Visits Discussed  Durward Fortes to schedule post hospital follow up with primary care provider. reviewed scheduled/ upcoming provider visits.]  Education Interventions   Education Provided Provided Printed Education  [facility] patient education article regarding fall prevention]  Pharmacy Interventions   Pharmacy Dicussed/Reviewed Pharmacy Topics Reviewed  [medications reviewed and compliance discussed.]  Safety Interventions   Safety Discussed/Reviewed Fall Risk  [Discussed fall prevention and mailed education article on fall prevention to patient.]              Our next appointment is by telephone on 06/10/22 at 1:30 pm  Please call the care guide team at [phone] if you need to cancel or reschedule your appointment.   If you are experiencing a Mental Health or [place] or need someone to talk to, please call the Suicide and Crisis Lifeline: 988 call 1-800-273-TALK (toll free, 24 hour hotline)  The patient verbalized understanding of instructions, educational  materials, and care plan provided today and agreed to receive a mailed copy of patient instructions, educational materials, and care plan.   Quinn Plowman RN,BSN,CCM [place] [phone] direct line  Fall Prevention in the Home, Adult Falls can cause injuries and can happen to people of all ages. There are many things you can do to make your home safer and to help prevent falls. What actions can I take to prevent falls? General information Use good lighting in all rooms. Make sure to: Replace any light bulbs that burn out. Turn on the lights in dark areas and use night-lights. Keep items that you use often in easy-to-reach places. Lower the shelves around your home if needed. Move furniture so that there are clear paths around it. Do not use throw rugs or other things on the floor that can make you trip. If any of your floors are uneven, fix them. Add color or contrast paint or tape to clearly mark and help you see: Grab bars or handrails. First and last steps of staircases. Where the edge of each step is. If you use a ladder or stepladder: Make sure that it is fully opened. Do not climb a closed ladder. Make sure the sides of the ladder are locked in place. Have someone hold the ladder while you use it. Know where your pets are as you move through your home. What can I do in the bathroom?     Keep the floor dry. Clean up any water on the floor right away. Remove soap buildup in the bathtub or shower. Buildup makes bathtubs and showers slippery. Use non-skid mats  or decals on the floor of the bathtub or shower. Attach bath mats securely with double-sided, non-slip rug tape. If you need to sit down in the shower, use a non-slip stool. Install grab bars by the toilet and in the bathtub and shower. Do not use towel bars as grab bars. What can I do in the bedroom? Make sure that you have a light by your bed that is easy to reach. Do not use any sheets or blankets on  your bed that hang to the floor. Have a firm chair or bench with side arms that you can use for support when you get dressed. What can I do in the kitchen? Clean up any spills right away. If you need to reach something above you, use a step stool with a grab bar. Keep electrical cords out of the way. Do not use floor polish or wax that makes floors slippery. What can I do with my stairs? Do not leave anything on the stairs. Make sure that you have a light switch at the top and the bottom of the stairs. Make sure that there are handrails on both sides of the stairs. Fix handrails that are broken or loose. Install non-slip stair treads on all your stairs if they do not have carpet. Avoid having throw rugs at the top or bottom of the stairs. Choose a carpet that does not hide the edge of the steps on the stairs. Make sure that the carpet is firmly attached to the stairs. Fix carpet that is loose or worn. What can I do on the outside of my home? Use bright outdoor lighting. Fix the edges of walkways and driveways and fix any cracks. Clear paths of anything that can make you trip, such as tools or rocks. Add color or contrast paint or tape to clearly mark and help you see anything that might make you trip as you walk through a door, such as a raised step or threshold. Trim any bushes or trees on paths to your home. Check to see if handrails are loose or broken and that both sides of all steps have handrails. Install guardrails along the edges of any raised decks and porches. Have leaves, snow, or ice cleared regularly. Use sand, salt, or ice melter on paths if you live where there is ice and snow during the winter. Clean up any spills in your garage right away. This includes grease or oil spills. What other actions can I take? Review your medicines with your doctor. Some medicines can cause dizziness or changes in blood pressure, which increase your risk of falling. Wear shoes that: Have a low  heel. Do not wear high heels. Have rubber bottoms and are closed at the toe. Feel good on your feet and fit well. Use tools that help you move around if needed. These include: Canes. Walkers. Scooters. Crutches. Ask your doctor what else you can do to help prevent falls. This may include seeing a physical therapist to learn to do exercises to move better and get stronger. Where to find more information Centers for Disease Control and Prevention, [employer]: StoreMirror.com.cy [employer] on Aging: AquariamTheater.[employer].nz National Institute on Aging: AquariamTheater.co.nz Contact a doctor if: You are afraid of falling at home. You feel weak, drowsy, or dizzy at home. You fall at home. Get help right away if you: Lose consciousness or have trouble moving after a fall. Have a fall that causes a head injury. These symptoms may be an emergency. Get help right away.  Call 911. Do not wait to see if the symptoms will go away. Do not drive yourself to the hospital. This information is not intended to replace advice given to you by your health care provider. Make sure you discuss any questions you have with your health care provider. Document Revised: 10/07/2021 Document Reviewed: 10/07/2021 Elsevier Patient Education  [place].

## 2022-05-06 NOTE — Patient Outreach (Signed)
  Care Coordination   Initial Visit Note   05/06/2022 Name: Madison Burns MRN: [id number] DOB: August 16, 1950  Madison Burns is a 72 y.o. year old female who sees Madison Pheasant, MD for primary care. I  spoke with patients sister and designated party release Madison Burns.   What matters to the patients health and wellness today?  Sister states patient is doing well. She states patient's left hand is healing well from fracture. She denies patient having any falls since hospitalization.  She states patient is having PT/ OT services with [facility]. Sister states patient is special needs.   Goals Addressed             This Visit's Progress    Post hospital follow up / management       Interventions Today    Flowsheet Row Most Recent Value  Chronic Disease   Chronic disease during today's visit Other  [Falls, myasthenia gravis]  General Interventions   General Interventions Discussed/Reviewed General Interventions Discussed, Doctor Visits, Vaccines  [Evaluation of current treatment plan related to falls/ Myesthenia gravis and pateints adherence to plan as established by provider. Confirmed with sister patients home PT/OT services started. Advised to request referral for HHA from OT/PT.]  Vaccines Shingles  [Discussed importance of having 2nd shingles vaccine.]  Doctor Visits Discussed/Reviewed Doctor Visits Discussed  Madison Burns to schedule post hospital follow up with primary care provider. reviewed scheduled/ upcoming provider visits.]  Education Interventions   Education Provided Provided Printed Education  [facility] patient education article regarding fall prevention]  Pharmacy Interventions   Pharmacy Dicussed/Reviewed Pharmacy Topics Reviewed  [medications reviewed and compliance discussed.]  Safety Interventions   Safety Discussed/Reviewed Fall Risk  [Discussed fall prevention and mailed education article on fall prevention to patient.]              SDOH  assessments and interventions completed:  Yes  SDOH Interventions Today    Flowsheet Row Most Recent Value  SDOH Interventions   Food Insecurity Interventions Intervention Not Indicated  Housing Interventions Intervention Not Indicated  Transportation Interventions Intervention Not Indicated        Care Coordination Interventions:  Yes, provided   Follow up plan: Follow up call scheduled for 06/10/22    Encounter Outcome:  Pt. Visit Completed   Quinn Plowman RN,BSN,CCM [place] [phone] direct line

## 2022-05-07 DIAGNOSIS — R911 Solitary pulmonary nodule: Secondary | ICD-10-CM | POA: Diagnosis not present

## 2022-05-07 DIAGNOSIS — K589 Irritable bowel syndrome without diarrhea: Secondary | ICD-10-CM | POA: Diagnosis not present

## 2022-05-07 DIAGNOSIS — S62357D Nondisplaced fracture of shaft of fifth metacarpal bone, left hand, subsequent encounter for fracture with routine healing: Secondary | ICD-10-CM | POA: Diagnosis not present

## 2022-05-07 DIAGNOSIS — Z85828 Personal history of other malignant neoplasm of skin: Secondary | ICD-10-CM | POA: Diagnosis not present

## 2022-05-07 DIAGNOSIS — Z85238 Personal history of other malignant neoplasm of thymus: Secondary | ICD-10-CM | POA: Diagnosis not present

## 2022-05-07 DIAGNOSIS — E869 Volume depletion, unspecified: Secondary | ICD-10-CM | POA: Diagnosis not present

## 2022-05-07 DIAGNOSIS — K760 Fatty (change of) liver, not elsewhere classified: Secondary | ICD-10-CM | POA: Diagnosis not present

## 2022-05-07 DIAGNOSIS — I10 Essential (primary) hypertension: Secondary | ICD-10-CM | POA: Diagnosis not present

## 2022-05-07 DIAGNOSIS — G4733 Obstructive sleep apnea (adult) (pediatric): Secondary | ICD-10-CM | POA: Diagnosis not present

## 2022-05-07 DIAGNOSIS — K219 Gastro-esophageal reflux disease without esophagitis: Secondary | ICD-10-CM | POA: Diagnosis not present

## 2022-05-07 DIAGNOSIS — I480 Paroxysmal atrial fibrillation: Secondary | ICD-10-CM | POA: Diagnosis not present

## 2022-05-07 DIAGNOSIS — G7000 Myasthenia gravis without (acute) exacerbation: Secondary | ICD-10-CM | POA: Diagnosis not present

## 2022-05-07 DIAGNOSIS — E039 Hypothyroidism, unspecified: Secondary | ICD-10-CM | POA: Diagnosis not present

## 2022-05-07 DIAGNOSIS — E7800 Pure hypercholesterolemia, unspecified: Secondary | ICD-10-CM | POA: Diagnosis not present

## 2022-05-07 DIAGNOSIS — M7981 Nontraumatic hematoma of soft tissue: Secondary | ICD-10-CM | POA: Diagnosis not present

## 2022-05-07 DIAGNOSIS — Z85528 Personal history of other malignant neoplasm of kidney: Secondary | ICD-10-CM | POA: Diagnosis not present

## 2022-05-07 DIAGNOSIS — E871 Hypo-osmolality and hyponatremia: Secondary | ICD-10-CM | POA: Diagnosis not present

## 2022-05-07 DIAGNOSIS — N3281 Overactive bladder: Secondary | ICD-10-CM | POA: Diagnosis not present

## 2022-05-07 DIAGNOSIS — L89626 Pressure-induced deep tissue damage of left heel: Secondary | ICD-10-CM | POA: Diagnosis not present

## 2022-05-12 ENCOUNTER — Ambulatory Visit: Payer: 59 | Admitting: Podiatry

## 2022-05-12 ENCOUNTER — Ambulatory Visit
Admission: RE | Admit: 2022-05-12 | Discharge: 2022-05-12 | Disposition: A | Discharge status: Home / Self Care | Payer: 59 | Source: Ambulatory Visit | Attending: Oncology | Admitting: Oncology

## 2022-05-12 DIAGNOSIS — C642 Malignant neoplasm of left kidney, except renal pelvis: Secondary | ICD-10-CM | POA: Insufficient documentation

## 2022-05-12 DIAGNOSIS — D4989 Neoplasm of unspecified behavior of other specified sites: Secondary | ICD-10-CM | POA: Insufficient documentation

## 2022-05-12 DIAGNOSIS — R918 Other nonspecific abnormal finding of lung field: Secondary | ICD-10-CM | POA: Diagnosis not present

## 2022-05-12 MED ORDER — IOHEXOL 300 MG/ML  SOLN
75.0000 mL | Freq: Once | INTRAMUSCULAR | Status: AC | PRN
  Administered 2022-05-12: 75 mL via INTRAVENOUS

## 2022-05-14 ENCOUNTER — Other Ambulatory Visit: Payer: Self-pay | Admitting: Neurology

## 2022-05-14 ENCOUNTER — Other Ambulatory Visit: Payer: Self-pay

## 2022-05-14 DIAGNOSIS — C642 Malignant neoplasm of left kidney, except renal pelvis: Secondary | ICD-10-CM

## 2022-05-15 ENCOUNTER — Ambulatory Visit
Admission: RE | Admit: 2022-05-15 | Discharge: 2022-05-15 | Disposition: A | Discharge status: Home / Self Care | Payer: 59 | Source: Ambulatory Visit | Attending: Urology | Admitting: Urology

## 2022-05-15 ENCOUNTER — Inpatient Hospital Stay: Payer: 59 | Attending: Oncology

## 2022-05-15 ENCOUNTER — Encounter: Payer: Self-pay | Admitting: Oncology

## 2022-05-15 ENCOUNTER — Inpatient Hospital Stay (HOSPITAL_BASED_OUTPATIENT_CLINIC_OR_DEPARTMENT_OTHER): Payer: 59 | Admitting: Oncology

## 2022-05-15 ENCOUNTER — Other Ambulatory Visit: Payer: Self-pay | Admitting: Oncology

## 2022-05-15 VITALS — BP 137/76 | HR 88 | Temp 96.1°F | Resp 18 | Wt 216.4 lb

## 2022-05-15 DIAGNOSIS — G7000 Myasthenia gravis without (acute) exacerbation: Secondary | ICD-10-CM | POA: Insufficient documentation

## 2022-05-15 DIAGNOSIS — Z85820 Personal history of malignant melanoma of skin: Secondary | ICD-10-CM | POA: Insufficient documentation

## 2022-05-15 DIAGNOSIS — C642 Malignant neoplasm of left kidney, except renal pelvis: Secondary | ICD-10-CM

## 2022-05-15 DIAGNOSIS — I080 Rheumatic disorders of both mitral and aortic valves: Secondary | ICD-10-CM | POA: Diagnosis not present

## 2022-05-15 DIAGNOSIS — D4989 Neoplasm of unspecified behavior of other specified sites: Secondary | ICD-10-CM | POA: Diagnosis not present

## 2022-05-15 DIAGNOSIS — N281 Cyst of kidney, acquired: Secondary | ICD-10-CM | POA: Diagnosis not present

## 2022-05-15 DIAGNOSIS — Z9049 Acquired absence of other specified parts of digestive tract: Secondary | ICD-10-CM | POA: Insufficient documentation

## 2022-05-15 DIAGNOSIS — Z923 Personal history of irradiation: Secondary | ICD-10-CM | POA: Diagnosis not present

## 2022-05-15 DIAGNOSIS — E119 Type 2 diabetes mellitus without complications: Secondary | ICD-10-CM | POA: Diagnosis not present

## 2022-05-15 DIAGNOSIS — F79 Unspecified intellectual disabilities: Secondary | ICD-10-CM | POA: Diagnosis not present

## 2022-05-15 DIAGNOSIS — C37 Malignant neoplasm of thymus: Secondary | ICD-10-CM | POA: Diagnosis not present

## 2022-05-15 DIAGNOSIS — I4891 Unspecified atrial fibrillation: Secondary | ICD-10-CM | POA: Diagnosis not present

## 2022-05-15 DIAGNOSIS — I252 Old myocardial infarction: Secondary | ICD-10-CM | POA: Diagnosis not present

## 2022-05-15 DIAGNOSIS — Z79899 Other long term (current) drug therapy: Secondary | ICD-10-CM | POA: Insufficient documentation

## 2022-05-15 DIAGNOSIS — E785 Hyperlipidemia, unspecified: Secondary | ICD-10-CM | POA: Diagnosis not present

## 2022-05-15 DIAGNOSIS — F17200 Nicotine dependence, unspecified, uncomplicated: Secondary | ICD-10-CM | POA: Diagnosis not present

## 2022-05-15 DIAGNOSIS — I2510 Atherosclerotic heart disease of native coronary artery without angina pectoris: Secondary | ICD-10-CM | POA: Diagnosis not present

## 2022-05-15 DIAGNOSIS — G459 Transient cerebral ischemic attack, unspecified: Secondary | ICD-10-CM | POA: Diagnosis not present

## 2022-05-15 LAB — CBC WITH DIFFERENTIAL (CANCER CENTER ONLY)
Abs Immature Granulocytes: 0.02 10*3/uL (ref 0.00–0.07)
Basophils Absolute: 0 10*3/uL (ref 0.0–0.1)
Basophils Relative: 1 %
Eosinophils Absolute: 0.1 10*3/uL (ref 0.0–0.5)
Eosinophils Relative: 1 %
HCT: 38.3 % (ref 36.0–46.0)
Hemoglobin: 12.3 g/dL (ref 12.0–15.0)
Immature Granulocytes: 0 %
Lymphocytes Relative: 13 %
Lymphs Abs: 0.9 10*3/uL (ref 0.7–4.0)
MCH: 30.6 pg (ref 26.0–34.0)
MCHC: 32.1 g/dL (ref 30.0–36.0)
MCV: 95.3 fL (ref 80.0–100.0)
Monocytes Absolute: 0.6 10*3/uL (ref 0.1–1.0)
Monocytes Relative: 9 %
Neutro Abs: 5.4 10*3/uL (ref 1.7–7.7)
Neutrophils Relative %: 76 %
Platelet Count: 195 10*3/uL (ref 150–400)
RBC: 4.02 MIL/uL (ref 3.87–5.11)
RDW: 17.4 % — ABNORMAL HIGH (ref 11.5–15.5)
WBC Count: 7 10*3/uL (ref 4.0–10.5)
nRBC: 0 % (ref 0.0–0.2)

## 2022-05-15 LAB — CMP (CANCER CENTER ONLY)
ALT: 20 U/L (ref 0–44)
AST: 32 U/L (ref 15–41)
Albumin: 3.6 g/dL (ref 3.5–5.0)
Alkaline Phosphatase: 67 U/L (ref 38–126)
Anion gap: 8 (ref 5–15)
BUN: 18 mg/dL (ref 8–23)
CO2: 25 mmol/L (ref 22–32)
Calcium: 8.1 mg/dL — ABNORMAL LOW (ref 8.9–10.3)
Chloride: 100 mmol/L (ref 98–111)
Creatinine: 1.02 mg/dL — ABNORMAL HIGH (ref 0.44–1.00)
GFR, Estimated: 59 mL/min — ABNORMAL LOW (ref >60)
Glucose, Bld: 97 mg/dL (ref 70–99)
Potassium: 3.6 mmol/L (ref 3.5–5.1)
Sodium: 133 mmol/L — ABNORMAL LOW (ref 135–145)
Total Bilirubin: 0.6 mg/dL (ref 0.3–1.2)
Total Protein: 6.5 g/dL (ref 6.5–8.1)

## 2022-05-15 MED ORDER — GADOBUTROL 1 MMOL/ML IV SOLN
9.0000 mL | Freq: Once | INTRAVENOUS | Status: AC | PRN
  Administered 2022-05-15: 9 mL via INTRAVENOUS

## 2022-05-15 NOTE — Assessment & Plan Note (Addendum)
#  Left  RCC, patient is status post cryoablation of the lesion.   She has a repeat MRI abdomen schedule today.

## 2022-05-15 NOTE — Assessment & Plan Note (Addendum)
#  Invasive thymoma, S/p 2 cycle of  reduced carboplatin AUC 4, etoposide day 1 to 3 at 75 mg/m, every 3 weeks, and concurrent radiation Labs are reviewed and discussed with patient. 05/13/2022, CT chest with contrast Postsurgical changes related to prior thymoma resection. No evidence of recurrent or metastatic disease.Mild patchy/ground-glass opacities in the bilateral upper lobes,favoring radiation changes given the clinical history Continue CT surveillance, next due in 1 year.

## 2022-05-15 NOTE — Assessment & Plan Note (Signed)
continue follow-up with neurology.

## 2022-05-15 NOTE — Progress Notes (Signed)
Hematology/Oncology Progress note [phone] [phone]   CHIEF COMPLAINTS/REASON FOR VISIT:  Follow-up for thymoma, history of RCC   ASSESSMENT & PLAN:   Thymoma #Invasive thymoma, S/p 2 cycle of  reduced carboplatin AUC 4, etoposide day 1 to 3 at 75 mg/m, every 3 weeks, and concurrent radiation Labs are reviewed and discussed with patient. 04/29/2021, CT chest with contrast showed stable examination.  No new or progressive findings.  Stable appearance of the anterior mediastinal soft tissue with associated dystrophic calcification.  Compatible with treated thymoma. Continue CT surveillance, next due in 1 year.  Renal cell cancer, left (HCC) #Left  RCC, patient is status post cryoablation of the lesion.   05/02/2021, MRI with and without contrast showed stable post ablation appearance of the left kidney.  No suspicious nodularity/enhancement identified.  No Lymphadenopathy  MG (myasthenia gravis) ([place]) continue follow-up with neurology.   Orders Placed This Encounter  Procedures   CT CHEST W CONTRAST    Standing Status:   Future    Standing Expiration Date:   05/15/2023    Order Specific Question:   If indicated for the ordered procedure, I authorize the administration of contrast media per Radiology protocol    Answer:   Yes    Order Specific Question:   Preferred imaging location?    Answer:   [facility]    Order Specific Question:   Radiology Contrast Protocol - do NOT remove file path    Answer:   [email or web address]   CBC with Differential (Cancer Center Only)    Standing Status:   Future    Standing Expiration Date:   05/15/2023   CMP ([place] only)    Standing Status:   Future    Standing Expiration Date:   05/15/2023   Follow-up in 1 year. All questions were answered. The patient knows to call the clinic with any problems, questions or concerns.  Earlie Server, MD, PhD [facility] Hematology  Oncology 05/15/2022   HISTORY OF PRESENTING ILLNESS:   Madison Burns is a  72 y.o.  female with PMH listed below was seen in consultation at the request of  Einar Pheasant, Madison Burns  for evaluation of thymoma.  Patient was accompanied by her sister today. Sister is POA. Patient lives with sister  Thymoma  02/07/2019 CT abdomen pelvis with contrast.   incidental 3 cm renal mass on the left.  Subsequent MRCP 02/21/2019 performed for the purpose of right upper quadrant pain as well as renal mass in question.  MRI 02/20/2018 showed 3.2 x 2.6 left lower pole endophytic renal mass, consistent with possible renal cell carcinoma.  No signs of metastatic disease in abdomen.  Adrenal lesion more suggestive adenomas.  There was an incidental finding of lobular area along the anterior mediastinum, questionably e mediastinal mass. CT scan 02/07/2019 showed lobulated internally calcified mass of the anterior mediastinum which effaces the central portion of the left brachiocephalic vein and a very closely abuts the tubular ascending aorta, 5.8 x 4.9 x 4.1 cm.  Adjacent pleural nodularity about the right upper lobe. 3 mm pulmonary nodule of the right upper lobe, hepatic steatosis.   # Patient underwent CT-guided needle biopsy of the anterior mediastinal mass, as well as biopsy of the kidney mass Anterior mediastinal mass biopsy pathology showed thymoma, predominantly B2 patent with subsets of a B3 pattern. Left kidney mass showed renal cell carcinoma,tumor cannot be reliably subtyped based on this limited sample and the IHC profile  She has a history  of melanoma on her neck in 2017.   #Cancer treatment 04/27/2019-05/18/2019 patient status post 2 cycles of dose reduced carboplatin and etoposide treatment concurrently with radiation. April 2021, patient  finished radiation treatments..  Surveillance  10/25/2020 CT chest w contrast showed stable disease. No signs of recurrence . 04/29/2021, CT chest with contrast showed  stable examination.  No new or progressive findings.  Stable appearance of the anterior mediastinal soft tissue with associated dystrophic calcification.  Compatible with treated thymoma.   Left RCC 08/17/2019-08/18/2019 Left  RCC, patient was admitted.  Patient underwent cystoscopy with Dr. Tammi Klippel with retrograde ureteral stent placement for renal protection during cryoablation.  The patient was taken to IR for an image guided left renal mass cryoablation by Dr. Laurence Ferrari.  Procedure was complicated by perinephric bleeding following the procedure.  She was monitored closely and remained hemodynamically stable.  Left RCC s/p ablation 10/05/2020   Surveillance  05/02/2021, MRI with and without contrast showed stable post ablation appearance of the left kidney.  No suspicious nodularity/enhancement identified.  No Lymphadenopathy  #Patient has a chronic history of myasthenia gravis, initially diagnosed in 2011.  Patient had been treated with prednisone for prolonged period of time.  Recently she has been off prednisone and is currently taking methotrexate and pyridostigmine for symptoms.  Per sister, patient does not complain much of weakness.  At her baseline.  She is able to feed and dress herself.  She needs some assistance from her sister for bathing.  She likes to watch TV.   INTERVAL HISTORY Madison Burns is a 72 y.o. female who has above history reviewed by me today presents for follow up visit for management of malignant thymoma, left renal cell carcinoma She has no new complaints today. Accompanied by sister.    Review of Systems  Unable to perform ROS: Other (Intellectual Disability)  Gastrointestinal:  Negative for diarrhea and nausea.  Genitourinary:  Negative for dysuria and frequency.   Neurological:        [place].    MEDICAL HISTORY:  Past Medical History:  Diagnosis Date   Cancer of kidney ([place]) [id number]   Complication of anesthesia    Myasthenia gravis   GERD  (gastroesophageal reflux disease)    History of seizure disorder    Hypercholesterolemia    Hypertension    no longer has it. wt loss   Hypokalemia 04/10/2022   Hyponatremia 04/10/2022   Hypothyroidism    Irritable bowel syndrome    Lactic acidosis 04/10/2022   Melanoma ([place]) 2017   Melanoma on neck   Myasthenia gravis ([place]) 2011   OSA on CPAP    Thymoma, malignant ([place])     SURGICAL HISTORY: Past Surgical History:  Procedure Laterality Date   [place]  2013   COLONOSCOPY WITH PROPOFOL N/A 06/30/2017   Procedure: COLONOSCOPY WITH PROPOFOL;  Surgeon: Toledo, Benay Pike, Madison Burns;  Location: [facility] ENDOSCOPY;  Service: Gastroenterology;  Laterality: N/A;   CYSTOSCOPY W/ URETERAL STENT PLACEMENT Left 08/17/2019   Procedure: CYSTOSCOPY WITH RETROGRADE PYELOGRAM/URETERAL STENT PLACEMENT;  Surgeon: Alexis Frock, Madison Burns;  Location: [facility];  Service: Urology;  Laterality: Left;  30 MINS   ESOPHAGOGASTRODUODENOSCOPY (EGD) WITH PROPOFOL N/A 06/30/2017   Procedure: ESOPHAGOGASTRODUODENOSCOPY (EGD) WITH PROPOFOL;  Surgeon: Toledo, Benay Pike, Madison Burns;  Location: [facility] ENDOSCOPY;  Service: Gastroenterology;  Laterality: N/A;   IR RADIOLOGIST EVAL & MGMT  06/28/2019   IR RADIOLOGIST EVAL & MGMT  09/14/2019  IR RADIOLOGIST EVAL & MGMT  03/28/2020   IR RADIOLOGIST EVAL & MGMT  10/10/2020   IR RADIOLOGIST EVAL & MGMT  11/22/2021   RADIOFREQUENCY ABLATION Left 08/17/2019   Procedure: LEFT RENAL CRYO ABLATION;  Surgeon: Jacqulynn Cadet, Madison Burns;  Location: [facility];  Service: Anesthesiology;  Laterality: Left;   TONSILLECTOMY  1959    SOCIAL HISTORY: Social History   Socioeconomic History   Marital status: Single    Spouse name: Not on file   Number of children: 0   Years of education: Special Ed   Highest education level: Not on file  Occupational History   Occupation: Unemployed  Tobacco Use   Smoking status: Never   Smokeless tobacco: Never  Vaping Use    Vaping Use: Never used  Substance and Sexual Activity   Alcohol use: No    Alcohol/week: 0.0 standard drinks of alcohol   Drug use: No   Sexual activity: Not Currently  Other Topics Concern   Not on file  Social History Narrative   06/23/17 lives with sister, Madison Burns   Regular exercise-no   Caffeine Use-yes   Social Determinants of Health   Financial Resource Strain: Not on file  Food Insecurity: No Food Insecurity (05/06/2022)   Hunger Vital Sign    Worried About Running Out of Food in the Last Year: Never true    [place] in the Last Year: Never true  Transportation Needs: No Transportation Needs (05/06/2022)   PRAPARE - Hydrologist (Medical): No    Lack of Transportation (Non-Medical): No  Physical Activity: Not on file  Stress: Not on file  Social Connections: Not on file  Intimate Partner Violence: Not on file    FAMILY HISTORY: Family History  Problem Relation Age of Onset   Colon cancer Maternal Grandfather        stomach/colon   Heart disease Father        myocardial infarction   Hyperlipidemia Father    Hyperlipidemia Sister    Diabetes Sister    Bone cancer Other        cousin   Alzheimer's disease Mother        maternal aunts   Skin cancer Mother    Myasthenia gravis Brother        ocular   Skin cancer Brother    Stroke Paternal Grandfather    Breast cancer Neg Hx     ALLERGIES:  has No Known Allergies.  MEDICATIONS:  Current Outpatient Medications  Medication Sig Dispense Refill   acetaminophen (TYLENOL) 325 MG tablet Take 2 tablets (650 mg total) by mouth every 6 (six) hours as needed for mild pain (or Fever >/= 101).     amiodarone (PACERONE) 200 MG tablet Take 1 tablet (200 mg total) by mouth daily. 14 tablet 0   Ascorbic Acid (VITAMIN C) 1000 MG tablet Take 1,000 mg by mouth at bedtime.     B Complex-C (B-COMPLEX WITH VITAMIN C) tablet Take 1 tablet by mouth every evening.     benzonatate (TESSALON) 100 MG  capsule TAKE 1 CAPSULE BY MOUTH TWICE DAILY AS NEEDED FOR COUGH 90 capsule 0   Biotin 10000 MCG TABS Take 10,000 mcg by mouth daily at 2 PM. (1430)     Calcium-Phosphorus-Vitamin D (CITRACAL +D3 PO) Take 1 tablet by mouth daily.     desmopressin (DDAVP NASAL) 0.01 % solution Place 1 spray (10 mcg total) into the nose 2 (two) times daily. 5  mL 12   diltiazem (CARDIZEM CD) 180 MG 24 hr capsule Take 180 mg by mouth daily.     DULoxetine (CYMBALTA) 30 MG capsule TAKE 1 CAPSULE BY MOUTH ONCE DAILY 30 capsule 2   folic acid (FOLVITE) 1 MG tablet TAKE 2 TABLETS BY MOUTH ONCE DAILY 60 tablet 11   Garlic 123XX123 MG CAPS Take 1,000 mg by mouth in the morning, at noon, and at bedtime. (0800, 1430, 2000)     levothyroxine (SYNTHROID) 75 MCG tablet TAKE 1 TABLET BY MOUTH ONCE DAILY ON AN EMPTY STOMACH. WAIT 30 MINUTES BEFORE TAKING OTHER MEDS. 90 tablet 1   magnesium hydroxide (MILK OF MAGNESIA) 400 MG/5ML suspension Take 30 mLs by mouth daily as needed for mild constipation. 355 mL 0   methotrexate (RHEUMATREX) 2.5 MG tablet TAKE 4 TABLETS BY MOUTH ONCE A WEEK WITHFOLIC ACID 16 tablet 5   pantoprazole (PROTONIX) 40 MG tablet Take 40 mg by mouth daily.      Probiotic Product (PROBIOTIC MULTI-ENZYME PO) Take 1 capsule by mouth daily. Probiotic Multi-Enzyme Digestive Formula     pyridostigmine (MESTINON) 60 MG tablet TAKE 1 TABLET BY MOUTH 3 TIMES DAILY 90 tablet 3   risperiDONE (RISPERDAL) 1 MG tablet Take 1 mg by mouth at bedtime.      tolterodine (DETROL LA) 4 MG 24 hr capsule TAKE 1 CAPSULE BY MOUTH ONCE EVERY MORNING 90 capsule 1   vitamin B-12 (CYANOCOBALAMIN) 250 MCG tablet Take 250 mcg by mouth in the morning, at noon, and at bedtime.     vitamin E 180 MG (400 UNITS) capsule Take 400 Units by mouth daily at 2 PM. 1430     No current facility-administered medications for this visit.     PHYSICAL EXAMINATION: ECOG PERFORMANCE STATUS: 1 - Symptomatic but completely ambulatory Vitals:   05/15/22 1003   BP: 137/76  Pulse: 88  Resp: 18  Temp: (!) 96.1 F (35.6 C)  SpO2: 98%   Filed Weights   05/15/22 1003  Weight: 216 lb 6.4 oz (98.2 kg)    Physical Exam Constitutional:      General: She is not in acute distress.    Appearance: She is obese.  HENT:     Head: Normocephalic and atraumatic.  Eyes:     General: No scleral icterus. Cardiovascular:     Rate and Rhythm: Normal rate and regular rhythm.     Heart sounds: Normal heart sounds.  Pulmonary:     Effort: Pulmonary effort is normal. No respiratory distress.     Breath sounds: No wheezing.  Abdominal:     General: Bowel sounds are normal. There is no distension.     Palpations: Abdomen is soft.  Musculoskeletal:        General: No deformity. Normal range of motion.     Cervical back: Normal range of motion and neck supple.  Skin:    General: Skin is warm and dry.     Findings: No erythema or rash.  Neurological:     Mental Status: She is alert. Mental status is at baseline.     Cranial Nerves: No cranial nerve deficit.     Comments: Oriented in person   Psychiatric:        Mood and Affect: Mood normal.     LABORATORY DATA:  I have reviewed the data as listed     Latest Ref Rng & Units 05/15/2022    9:52 AM 04/10/2022    8:06 AM 04/09/2022  4:36 AM  CBC  WBC 4.0 - 10.5 K/uL 7.0  8.1  10.3   Hemoglobin 12.0 - 15.0 g/dL 12.3  8.9  8.9   Hematocrit 36.0 - 46.0 % 38.3  28.1  27.9   Platelets 150 - 400 K/uL 195  284  291       Latest Ref Rng & Units 05/15/2022    9:52 AM 04/09/2022    4:36 AM 04/08/2022    3:30 AM  CMP  Glucose 70 - 99 mg/dL 97  112  93   BUN 8 - 23 mg/dL 18  15  15    Creatinine 0.44 - 1.00 mg/dL 1.02  0.77  0.82   Sodium 135 - 145 mmol/L 133  137  137   Potassium 3.5 - 5.1 mmol/L 3.6  4.1  4.1   Chloride 98 - 111 mmol/L 100  101  103   CO2 22 - 32 mmol/L 25  28  27    Calcium 8.9 - 10.3 mg/dL 8.1  8.3  7.8   Total Protein 6.5 - 8.1 g/dL 6.5     Total Bilirubin 0.3 - 1.2 mg/dL 0.6      Alkaline Phos 38 - 126 U/L 67     AST 15 - 41 U/L 32     ALT 0 - 44 U/L 20        Iron/TIBC/Ferritin/ %Sat No results found for: "IRON", "TIBC", "FERRITIN", "IRONPCTSAT"    RADIOGRAPHIC STUDIES: I have personally reviewed the radiological images as listed and agreed with the findings in the report.. CT Chest W Contrast  Result Date: 05/13/2022 CLINICAL DATA:  Follow-up malignant thymoma, status post chemotherapy and radiation EXAM: CT CHEST WITH CONTRAST TECHNIQUE: Multidetector CT imaging of the chest was performed during intravenous contrast administration. RADIATION DOSE REDUCTION: This exam was performed according to the departmental dose-optimization program which includes automated exposure control, adjustment of the mA and/or kV according to patient size and/or use of iterative reconstruction technique. CONTRAST:  26mL OMNIPAQUE IOHEXOL 300 MG/ML  SOLN COMPARISON:  04/02/2022 FINDINGS: Cardiovascular: The heart is normal in size. No pericardial effusion. No evidence of thoracic aneurysm. Mediastinum/Nodes: No suspicious mediastinal lymphadenopathy. Postsurgical changes related to prior thymoma resection with fluid/soft tissue along the anterior mediastinum. Visualized thyroid is unremarkable. Lungs/Pleura: Mild patchy/ground-glass opacities in the bilateral upper lobes, right greater than left, new from the prior. While infection/pneumonia is possible, this appearance is likely related to radiation changes given the clinical history. No suspicious pulmonary nodules. No pleural effusion or pneumothorax. Upper Abdomen: Visualized upper abdomen is notable for stable low-density thickening of the bilateral renal glands, compatible with adrenal hyperplasia. Status post cholecystectomy. Musculoskeletal: Mild degenerative changes of the visualized thoracolumbar spine. IMPRESSION: Postsurgical changes related to prior thymoma resection. No evidence of recurrent or metastatic disease. Mild  patchy/ground-glass opacities in the bilateral upper lobes, favoring radiation changes given the clinical history. Electronically Signed   By: Julian Hy M.D.   On: 05/13/2022 02:42   CT PELVIS WO CONTRAST  Result Date: 04/07/2022 CLINICAL DATA:  Follow up gluteal hematoma EXAM: CT PELVIS WITHOUT CONTRAST TECHNIQUE: Multidetector CT imaging of the pelvis was performed following the standard protocol without intravenous contrast. RADIATION DOSE REDUCTION: This exam was performed according to the departmental dose-optimization program which includes automated exposure control, adjustment of the mA and/or kV according to patient size and/or use of iterative reconstruction technique. COMPARISON:  04/02/2022 FINDINGS: Urinary Tract:  Unremarkable urinary bladder. Bowel: Diverticulosis and increased stool consistent with constipation. Vascular/Lymphatic:  Atheromatous calcifications aorta and its branches. No adenopathy identified. Reproductive:  Status post hysterectomy Other: Right buttock hyperdense collection consistent with hematoma now measures 16 x 14 x 7 cm without significant interval change. Musculoskeletal: Lumbosacral degenerative changes. IMPRESSION: Right buttock hyperdense collection consistent with hematoma without significant interval change. Electronically Signed   By: Sammie Bench M.D.   On: 04/07/2022 10:30   CT CHEST ABDOMEN PELVIS W CONTRAST  Result Date: 04/02/2022 CLINICAL DATA:  Sepsis. History of treated thymoma and renal cell carcinoma. EXAM: CT CHEST, ABDOMEN, AND PELVIS WITH CONTRAST TECHNIQUE: Multidetector CT imaging of the chest, abdomen and pelvis was performed following the standard protocol during bolus administration of intravenous contrast. RADIATION DOSE REDUCTION: This exam was performed according to the departmental dose-optimization program which includes automated exposure control, adjustment of the mA and/or kV according to patient size and/or use of iterative  reconstruction technique. CONTRAST:  175mL OMNIPAQUE IOHEXOL 300 MG/ML  SOLN COMPARISON:  CT chest 04/29/2021, CT abdomen pelvis 06/15/2019, MRI abdomen 05/02/2021 FINDINGS: CT CHEST FINDINGS Cardiovascular: No significant vascular findings. Normal heart size. No pericardial effusion. Mediastinum/Nodes: Visualized thyroid unremarkable. Stable dystrophic calcification and soft tissue within the anterior mediastinum likely representing post treatment change related to reported thymoma. The esophagus is gas filled suggesting changes of gastroesophageal reflux or esophageal dysmotility. No pathologic adenopathy within the thorax. Lungs/Pleura: Stable cicatricial changes within the right paramediastinal upper lobe in keeping with post radiation fibrosis. Mild right basilar fibrotic change. Mosaic attenuation within the lungs bilaterally is in keeping with multifocal air trapping related to small airways disease. No confluent pulmonary infiltrate. No pneumothorax or pleural effusion. No central obstructing lesion. Musculoskeletal: Chronic subluxation of the sternoclavicular articulations bilaterally, unchanged. No acute bone abnormality. No lytic or blastic bone lesion. CT ABDOMEN PELVIS FINDINGS Hepatobiliary: No focal liver abnormality is seen. Status post cholecystectomy. No biliary dilatation. Pancreas: Unremarkable. No pancreatic ductal dilatation or surrounding inflammatory changes. Spleen: Unremarkable Adrenals/Urinary Tract: Nodular thickening of the adrenal glands bilaterally is stable since prior examination in keeping with nodular hyperplasia. The kidneys are normal in size and position. Post ablated changes are seen within the posterior lower pole of the left kidney, stable since prior MRI examination. No enhancing intra cortical mass. No hydronephrosis. No intrarenal or ureteral calculi. The bladder is unremarkable. Stomach/Bowel: Moderate sigmoid diverticulosis. Stomach, small bowel, and large bowel are  otherwise unremarkable. No evidence of obstruction or focal inflammation. No free intraperitoneal gas or fluid. Appendix absent. Vascular/Lymphatic: Aortic atherosclerosis. No enlarged abdominal or pelvic lymph nodes. Reproductive: Status post hysterectomy. No adnexal masses. Other: No abdominal wall hernia. There is hyperdense soft tissue infiltrating within the musculature of the gluteus medius and extending laterally into the subcutaneous soft tissues of the right gluteal region in keeping with a intramuscular and subcutaneous hematoma measuring at least 6.0 x 10.3 x 12.7 cm (volume = 410 cm^3). There is extensive overlying subcutaneous edema. Musculoskeletal: No acute bone abnormality. Degenerative changes are seen within the lumbar spine. IMPRESSION: 1. 12.7 cm intramuscular and subcutaneous hematoma within the right gluteal region. Extensive overlying subcutaneous edema. 2. Stable post treatment change within the anterior mediastinum. 3. Stable post ablated changes within the lower pole of the left kidney. 4. Moderate sigmoid diverticulosis without superimposed acute inflammatory change. Aortic Atherosclerosis (ICD10-I70.0). Electronically Signed   By: Fidela Salisbury M.D.   On: 04/02/2022 01:59   DG Chest 1 View  Result Date: 04/01/2022 CLINICAL DATA:  Fall chest pain EXAM: CHEST  1 VIEW COMPARISON:  11/04/2019, 04/08/2020,  chest CT 04/29/2021 FINDINGS: No acute airspace disease or effusion. Normal cardiomediastinal silhouette. No pneumothorax. Mild right parahilar distortion is stable and presumably related to post treatment change. IMPRESSION: No active disease. Electronically Signed   By: Donavan Foil M.D.   On: 04/01/2022 23:10   DG Hips Bilat W or Wo Pelvis 2 Views  Result Date: 04/01/2022 CLINICAL DATA:  Fall EXAM: DG HIP (WITH OR WITHOUT PELVIS) 2V BILAT COMPARISON:  None Available. FINDINGS: Bones are osteopenic. There is no evidence of hip fracture or dislocation. There is no evidence of  arthropathy or other focal bone abnormality. There surgical clips in the right lower quadrant. IMPRESSION: No acute fracture or dislocation. Osteopenia. Electronically Signed   By: Ronney Asters M.D.   On: 04/01/2022 23:08   DG Hand Complete Left  Result Date: 04/01/2022 CLINICAL DATA:  Fall EXAM: LEFT HAND - COMPLETE 3+ VIEW COMPARISON:  02/27/2021 FINDINGS: Chronic deformity at the tufts of the third and fourth distal phalanges. Acute to subacute nondisplaced fracture involving the proximal shaft of the fifth metacarpal. No subluxation. Mild degenerative change at the first [facility] joint IMPRESSION: Acute to subacute nondisplaced fracture involving the proximal shaft of the fifth metacarpal. Electronically Signed   By: Donavan Foil M.D.   On: 04/01/2022 23:08   CT Head Wo Contrast  Result Date: 04/01/2022 CLINICAL DATA:  Head trauma, minor (Age >= 65y); Neck trauma (Age >= 65y). Myasthenia gravis, multiple falls EXAM: CT HEAD WITHOUT CONTRAST CT CERVICAL SPINE WITHOUT CONTRAST TECHNIQUE: Multidetector CT imaging of the head and cervical spine was performed following the standard protocol without intravenous contrast. Multiplanar CT image reconstructions of the cervical spine were also generated. RADIATION DOSE REDUCTION: This exam was performed according to the departmental dose-optimization program which includes automated exposure control, adjustment of the mA and/or kV according to patient size and/or use of iterative reconstruction technique. COMPARISON:  02/27/2021 FINDINGS: CT HEAD FINDINGS Brain: Normal anatomic configuration. Parenchymal volume loss is commensurate with the patient's age. Mild periventricular white matter changes are present likely reflecting the sequela of small vessel ischemia. No abnormal intra or extra-axial mass lesion or fluid collection. No abnormal mass effect or midline shift. No evidence of acute intracranial hemorrhage or infarct. Ventricular size is normal. Cerebellum  unremarkable. Vascular: No asymmetric hyperdense vasculature at the skull base. Skull: Intact Sinuses/Orbits: Paranasal sinuses are clear. Orbits are unremarkable. Other: Mastoid air cells and middle ear cavities are clear. CT CERVICAL SPINE FINDINGS Alignment: Normal. Skull base and vertebrae: Craniocervical alignment is normal. The atlantodental interval is not widened. No acute fracture of the cervical spine. Vertebral body height is preserved. Soft tissues and spinal canal: No prevertebral fluid or swelling. No visible canal hematoma. Disc levels: There is intervertebral disc space narrowing and endplate remodeling at [id number] in keeping with changes of diffuse moderate to severe degenerative disc disease. Prevertebral soft tissues are not thickened on sagittal reformats. No high-grade canal stenosis or neuroforaminal narrowing. Upper chest: Negative. Other: None IMPRESSION: 1. No acute intracranial abnormality. No calvarial fracture. 2. No acute fracture or listhesis of the cervical spine. Electronically Signed   By: Fidela Salisbury M.D.   On: 04/01/2022 22:34   CT Cervical Spine Wo Contrast  Result Date: 04/01/2022 CLINICAL DATA:  Head trauma, minor (Age >= 65y); Neck trauma (Age >= 65y). Myasthenia gravis, multiple falls EXAM: CT HEAD WITHOUT CONTRAST CT CERVICAL SPINE WITHOUT CONTRAST TECHNIQUE: Multidetector CT imaging of the head and cervical spine was performed following the standard protocol without intravenous  contrast. Multiplanar CT image reconstructions of the cervical spine were also generated. RADIATION DOSE REDUCTION: This exam was performed according to the departmental dose-optimization program which includes automated exposure control, adjustment of the mA and/or kV according to patient size and/or use of iterative reconstruction technique. COMPARISON:  02/27/2021 FINDINGS: CT HEAD FINDINGS Brain: Normal anatomic configuration. Parenchymal volume loss is commensurate with the patient's age.  Mild periventricular white matter changes are present likely reflecting the sequela of small vessel ischemia. No abnormal intra or extra-axial mass lesion or fluid collection. No abnormal mass effect or midline shift. No evidence of acute intracranial hemorrhage or infarct. Ventricular size is normal. Cerebellum unremarkable. Vascular: No asymmetric hyperdense vasculature at the skull base. Skull: Intact Sinuses/Orbits: Paranasal sinuses are clear. Orbits are unremarkable. Other: Mastoid air cells and middle ear cavities are clear. CT CERVICAL SPINE FINDINGS Alignment: Normal. Skull base and vertebrae: Craniocervical alignment is normal. The atlantodental interval is not widened. No acute fracture of the cervical spine. Vertebral body height is preserved. Soft tissues and spinal canal: No prevertebral fluid or swelling. No visible canal hematoma. Disc levels: There is intervertebral disc space narrowing and endplate remodeling at [id number] in keeping with changes of diffuse moderate to severe degenerative disc disease. Prevertebral soft tissues are not thickened on sagittal reformats. No high-grade canal stenosis or neuroforaminal narrowing. Upper chest: Negative. Other: None IMPRESSION: 1. No acute intracranial abnormality. No calvarial fracture. 2. No acute fracture or listhesis of the cervical spine. Electronically Signed   By: Fidela Salisbury M.D.   On: 04/01/2022 22:34   Nocturnal polysomnography  Result Date: 03/24/2022 Madison Seat, Madison Burns     03/28/2022  6:10 PM  [facility] POLYSOMNOGRAPHY  INTERPRETATION REPORT STUDY DATE:  03/24/2022  PATIENT NAME:  Madison Burns        DATE OF BIRTH:  [birth year] PATIENT ID:  [id number]    TYPE OF STUDY:  PSG READING PHYSICIAN: Madison Seat, Madison Burns REFERRED BY: GNA  [name], NP SCORING TECHNICIAN: Madison Burns, RPSGT HISTORY:  This 72 year-old Female patient was seen on  08/23/21 and has a history of mental retardation, is living with her  older sister, Madison Burns. She has Myasthenia gravis ,  seizures and atrial fibrillation, has been diagnosed with  kidney cancer - finished her chemotherapy. She reportedly snores and is a light sleeper, has enuresis. Enuresis started after a fall in 04/07/21 and there was a concern of nocturnal seizures but responded to desmopressin. She had frequent falls in 2023. A previous HST had not shown any apnea and Madison Burns, her sister, wanted to have a PSG- in lab study was scheduled for 09-2021 but patient contracted Covid and then forgot to reschedule. She gained some weight since her gait has been affected by MG and she has become less active, about 20 pounds. ADDITIONAL INFORMATION:  The Epworth Sleepiness Scale endorsed at 3 /24 points (scores above or equal to 10 are suggestive of hypersomnolence). FSS endorsed at:  n/a  /63 points. Sister Madison Burns is present during the sleep test.  Height: 64 in Weight: 225 lbs (BMI 38) Neck Size: 16 in MEDICATIONS: Folic Acid, Vitamin E, Detrol, Risperdal for sleep, Mestinon, Protonix, Probiotic, Synthroid, Rheumatrex, Garlic, Cymbalta, Desmopressin, Vitamin D, Tessalon, Biotin, B Complex, Vitamin C, Pacerone, Tylenol TECHNICAL DESCRIPTION: A registered sleep technologist ( RPSGT)  was in attendance for the duration of the recording.  Data collection, scoring, video monitoring, and reporting were performed in compliance with the AASM Manual for the  Scoring of Sleep and Associated Events; (Hypopnea is scored based on the criteria listed in Section VIII D. 1b in the AASM Manual V2.6 using a 4% oxygen desaturation rule or Hypopnea is scored based on the criteria listed in Section VIII D. 1a in the AASM Manual V2.6 using 3% oxygen desaturation and /or arousal rule). SLEEP CONTINUITY AND SLEEP ARCHITECTURE:  Lights-out was at 21:13: and lights-on at  05:06:, with  7.9 hours of recording time, 460 minutes . Total sleep time ( TST) was 161.0 minutes with a decreased sleep efficiency at 34.0%. There  was no REM sleep - Sleep latency was increased at 149.0 minutes.  Of the total sleep time, the percentage of stage N1 sleep was 14.6%, stage N2 sleep was 85%, stage N3 sleep was 0.0%, and REM sleep was 0.0%. BODY POSITION:  TST was divided between the following sleep positions: 100.0% supine; 0.0% lateral; 0% prone.  There were 17 awakenings (i.e., transitions to Stage W from any sleep stage), and 46 total stage transitions. Wake after sleep onset (WASO) time accounted for 163 minutes. RESPIRATORY MONITORING:  Based on AASM criteria (using a 3% oxygen desaturation and /or arousal rule for scoring hypopneas), there were 3 apneas (3 obstructive; 0 central; 0 mixed), and 10 hypopneas. The Apnea index was 1.1/h. Hypopnea index was 3.7/h. The AHI (apnea-hypopnea index) was 4.8/h overall (4.8 supine, 0 non-supine; 0.0 REM, 0.0 supine REM). Mild to moderate snoring was noted but did not cause arousals.  There were 6 respiratory effort-related arousals (RERAs). The RDI is 7.2/h. OXIMETRY: Oxyhemoglobin Saturation Nadir during sleep was at  84%) from a mean of 93%.  Of the Total sleep time (TST) there was   hypoxemia (=<89%) present for 0.5 minutes, or 0.3% of total sleep time. LIMB MOVEMENTS: There were 0 periodic limb movements of sleep (0.0/h) AROUSAL: There were 26 arousals in total, for an arousal index of 10 /hour.  Of these, 1 was identified as respiratory-related arousals (0 /h), 0 were PLM-related arousals (0 /h), and 35 were non-specific arousals (13 /h) and 10 were microarousals. There were 0 occurrences of Cheyne Stokes breathing. EEG:  PSG EEG was of normal amplitude and frequency, with symmetric manifestation of sleep stages. EKG: The electrocardiogram documented regular rhythm.  The average heart rate during sleep was 71 bpm.  The heart rate during sleep varied between a minimum of 65and  a maximum of  75 bpm. AUDIO and VIDEO: IMPRESSION: 1) Sleep disordered breathing was present at a clinically irrelevant  degree with an AHI of less than 5/h.   2) Periodic limb movements were not present. No enuresis occurred and no dream enactment was captured. 3) Sleep efficiency was very poor, unclear if this night was representative or not. The patient uses Risperdal to aid her sleep. Total sleep time was reduced at 161.0 minutes.  Sleep efficiency was decreased at 34.0%.  RECOMMENDATIONS: based on these PSG results, there is snoring present while sleeping supine but not enough apnea is present to warrant intervention or therapy. Snoring can be highly dependent on body weight and weight loss is recommended.  If the patient is able to sleep in non-supine sleep, her snoring will also be reduced. Madison Seat, Madison Burns [facility] Fellow of the AASM and AAN,  Diplomate of the ABPN and ABSM.   Name: Davesha, Lockaby BMI: [id number] Physician: Madison Seat, Madison Burns ID: [id number] Height: 64.0 in Technician: Madison Burns, RPSGT Sex: Female Weight: 225.0 lb Record: [id number] Age: 25 [January 24, 1951]  Date: 03/24/2022   Medical & Medication History   Domino returns for follow up for MG and questionable OSA. She was last seen by Dr Brett Fairy 08/2021. In lab PSA ordered as previous HST had not shown any significant sleep apnea. Sister was concerned about witnessed snoring, apneas and enuresis. She was started on DDAVP nasal spray. Since, she reports doing fairly well. MG symptoms seem to be well controlled. She continues mestinon 60mg  TID, methotrexate 10mg  weekly and folic acid 2mg  daily. Gait is stable. She is not using CPAP. Madison Burns, sister, is still concerned about her snoring. They were scheduled for in lab sleep study in 09/2021 but came down with Covid and have forgotten to call to reschedule. She is not as active. She has gained about 15-20lbs. 09/04/2021 CD: TRANIYA ESTENSON is a 72 y.o. female with mental retardation, living with her sister.Here for 6 month MG and sleep f/u. Pt continues to snore and has started to wet the bed ever since  her last fall, 2/19. Pt is such a light sleeper, currently taking risperidone. Had 3 falls in January and since has been wetting the bed. Miss Merrisa has been diagnosed with renal cell cancer and began having enuresis- her Myasthenia has taken a turn for the worse. She is presenting with ptosis , neck weakness and left shoulder slump. She is no longer able to walk unassisted, presents stooped. Her voice is hoarse. Her mood is good, she lost weight , BMI 36.73 kg/m2. She completed her cancer treatments. She brought me another drawing- I have a collection of the teddy brea drawings since 15 years ago. She is awaiting a CT of the kidney or MRI. Calculated pAHI (per hour): The calculated AHI was only 3.5/h there was no breakdown in rem sleep and non-REM sleep possible. No data of sleep position or snoring were provided either. O2 Saturation Range (%): Oxygen saturation ranged between a nadir of 74% and a maximum saturation of 97% with a mean oxygenation at 93% SPO2. Time spent under 89% oxygen saturation amounted to 5.2 minutes or 1.4% of sleep time. Folic Acid, Vitamin E, Detrol, Risperdal, Mestinon, Protonix, Probiotic, Snythroid, Rheumatrex, Garlic, Cymbalta, Desmopressin, Vitamin D, Tessalon, Biotin, B Complex, Vitamin C, Pacerone, Tylenol  Sleep Disorder    Comments  The patient came into the lab for a PSG. She took Risperdal prior to start of sleep study. Prior hst on 10/15/20 with our sleep lab. The patient also had sleep studies completed in 2016. The patient's sister insisted she needed to be present during the sleep study. The patient had very little sleep. Per patient's sister the patient is a very light sleeper. Mild to moderate snoring. The patient had one restroom break. EKG kept in NSR. Respiratory events scored with a 4% desat. Slept supine.   Lights out: 09:13:08 PM Lights on: 05:06:41 AM Time Total Supine Side Prone Upright Recording (TRT) 7h 53.60m 7h 53.1m 0h 0.58m 0h 0.16m 0h 0.1m Sleep (TST) 2h 41.47m  2h 41.32m 0h 0.31m 0h 0.93m 0h 0.20m Latency N1 N2 N3 REM Onset Per. Slp. Eff. Actual 2h 29.14m 2h 35.22m 0h 0.55m 0h 0.22m 2h 29.56m 2h 43.65m 34.00% Stg Dur Wake N1 N2 N3 REM Total 163.5 23.5 137.5 0.0 0.0 Supine 163.5 23.5 137.5 0.0 0.0 Side 0.0 0.0 0.0 0.0 0.0 Prone 0.0 0.0 0.0 0.0 0.0 Upright 0.0 0.0 0.0 0.0 0.0  Stg % Wake N1 N2 N3 REM Total 50.4 14.6 85.4 0.0 0.0 Supine 50.4 14.6 85.4 0.0 0.0 Side 0.0  0.0 0.0 0.0 0.0 Prone 0.0 0.0 0.0 0.0 0.0 Upright 0.0 0.0 0.0 0.0 0.0  Apnea Summary Sub Supine Side Prone Upright Total 3 Total 3 3 0 0 0   REM 0 0 0 0 0   NREM 3 3 0 0 0 Obs 3 REM 0 0 0 0 0   NREM 3 3 0 0 0 Mix 0 REM 0 0 0 0 0   NREM 0 0 0 0 0 Cen 0 REM 0 0 0 0 0   NREM 0 0 0 0 0 Rera Summary Sub Supine Side Prone Upright Total 0 Total 0 0 0 0 0   REM 0 0 0 0 0   NREM 0 0 0 0 0  Hypopnea Summary Sub Supine Side Prone Upright Total 10 Total 10 10 0 0 0   REM 0 0 0 0 0   NREM 10 10 0 0 0 4% Hypopnea Summary Sub Supine Side Prone Upright Total (4%) 9 Total 9 9 0 0 0   REM 0 0 0 0 0   NREM 9 9 0 0 0  AHI Total Obs Mix Cen 4.84 Apnea 1.12 1.12 0.00 0.00  Hypopnea 3.73 -- -- -- 4.47 Hypopnea (4%) 3.35 -- -- --  Total Supine Side Prone Upright Position AHI 4.84 4.84 0.00 0.00 0.00 REM AHI 0.00  NREM AHI 4.84  Position RDI 4.84 4.84 0.00 0.00 0.00 REM RDI 0.00  NREM RDI 4.84  4% Hypopnea Total Supine Side Prone Upright Position AHI (4%) 4.47 4.47 0.00 0.00 0.00 REM AHI (4%) 0.00  NREM AHI (4%) 4.47  Position RDI (4%) 4.47 4.47 0.00 0.00 0.00 REM RDI (4%) 0.00  NREM RDI (4%) 4.47  Desaturation Information Threshold: 2% <100% <90% <80% <70% <60% <50% <40% Supine 121.0 5.0 0.0 0.0 0.0 0.0 0.0 Side 0.0 0.0 0.0 0.0 0.0 0.0 0.0 Prone 0.0 0.0 0.0 0.0 0.0 0.0 0.0 Upright 0.0 0.0 0.0 0.0 0.0 0.0 0.0 Total 121.0 5.0 0.0 0.0 0.0 0.0 0.0 Index 22.4 0.9 0.0 0.0 0.0 0.0 0.0 Threshold: 3% <100% <90% <80% <70% <60% <50% <40% Supine 16.0 5.0 0.0 0.0 0.0 0.0 0.0 Side 0.0 0.0 0.0 0.0 0.0 0.0 0.0 Prone 0.0 0.0 0.0 0.0 0.0 0.0 0.0 Upright 0.0 0.0  0.0 0.0 0.0 0.0 0.0 Total 16.0 5.0 0.0 0.0 0.0 0.0 0.0 Index 3.0 0.9 0.0 0.0 0.0 0.0 0.0 Threshold: 4% <100% <90% <80% <70% <60% <50% <40% Supine 12.0 5.0 0.0 0.0 0.0 0.0 0.0 Side 0.0 0.0 0.0 0.0 0.0 0.0 0.0 Prone 0.0 0.0 0.0 0.0 0.0 0.0 0.0 Upright 0.0 0.0 0.0 0.0 0.0 0.0 0.0 Total 12.0 5.0 0.0 0.0 0.0 0.0 0.0 Index 2.2 0.9 0.0 0.0 0.0 0.0 0.0 Threshold: 4% <100% <90% <80% <70% <60% <50% <40% Supine 12 5 0 0 0 0 0 Side 0 0 0 0 0 0 0 Prone 0 0 0 0 0 0 0 Upright 0 0 0 0 0 0 0 Total 12 5 0 0 0 0 0  Awakening/Arousal Information # of Awakenings 17 Wake after sleep onset 163.2m Wake after persistent sleep 157.59m Arousal Assoc. Arousals Index Apneas 0 0.0 Hypopneas 1 0.4 Leg Movements 0 0.0 Snore 0 0.0 PTT Arousals 0 0.0 Spontaneous 35 13.0 Total 36 13.4 Leg Movement Information PLMS LMs Index Total LMs during PLMS 0 0.0 LMs w/ Microarousals 0 0.0 LM LMs Index w/ Microarousal 0 0.0 w/ Awakening 0 0.0 w/ Resp Event 0 0.0 Spontaneous 0 0.0 Total  0 0.0  Desaturation threshold setting: 4% Minimum desaturation setting: 10 seconds SaO2 nadir: 84% The longest event was a 43 sec obstructive Hypopnea with a minimum SaO2 of 90%. The lowest SaO2 was 84% associated with a 22 sec obstructive Hypopnea. EKG Rates EKG Avg Max Min Awake 72 97 67 Asleep 71 75         ASSESSMENT & PLAN:  1. Thymoma   2. Renal cell cancer, left ([place])   3. MG (myasthenia gravis) (H[facility])    #Invasive thymoma, S/p 2 cycle of  reduced carboplatin AUC 4, etoposide day 1 to 3 at 75 mg/m, every 3 weeks, and concurrent radiation Labs are reviewed and discussed with patient. 04/29/2021, CT chest with contrast showed stable examination.  No new or progressive findings.  Stable appearance of the anterior mediastinal soft tissue with associated dystrophic calcification.  Compatible with treated thymoma. Continue CT surveillance, next due in 1 year.   #Myasthenia gravis, continue follow-up with neurology. #Left  RCC, patient is status post cryoablation  of the lesion.   05/02/2021, MRI with and without contrast showed stable post ablation appearance of the left kidney.  No suspicious nodularity/enhancement identified.  No Lymphadenopathy  #Frequent falls Home care referral for PT/OT evaluation. Check MRI brain with and without contrast  #Right lower extremity swelling, likely soft tissue injury due to recent fall episodes.  We will check right lower extremity ultrasound to rule out DVT.    Advised patient to follow-up in 12 months or earlier if indicated. All questions were answered. The patient knows to call the clinic with any problems questions or concerns.   Earlie Server, MD, PhD Hematology Oncology 05/15/22

## 2022-05-21 ENCOUNTER — Encounter: Payer: Self-pay | Admitting: Podiatry

## 2022-05-21 ENCOUNTER — Ambulatory Visit (INDEPENDENT_AMBULATORY_CARE_PROVIDER_SITE_OTHER): Payer: 59 | Admitting: Podiatry

## 2022-05-21 DIAGNOSIS — B351 Tinea unguium: Secondary | ICD-10-CM

## 2022-05-21 DIAGNOSIS — L89626 Pressure-induced deep tissue damage of left heel: Secondary | ICD-10-CM | POA: Diagnosis not present

## 2022-05-21 DIAGNOSIS — M79674 Pain in right toe(s): Secondary | ICD-10-CM | POA: Diagnosis not present

## 2022-05-21 DIAGNOSIS — M79675 Pain in left toe(s): Secondary | ICD-10-CM | POA: Diagnosis not present

## 2022-05-22 ENCOUNTER — Telehealth: Payer: Self-pay | Admitting: Internal Medicine

## 2022-05-22 NOTE — Telephone Encounter (Signed)
Please call and clarify who gave the order initially.  Was this from ortho.

## 2022-05-22 NOTE — Telephone Encounter (Signed)
Madison Burns is unsure who gave orders and was away from his desk. Supposed to call back and let me know.

## 2022-05-22 NOTE — Telephone Encounter (Signed)
Orders received and placed in quick sign for your signature. Will call ryan to let him know.

## 2022-05-22 NOTE — Telephone Encounter (Signed)
[facility] called stating they have not seen pt since 3/20 because they have not go any orders back [phone]

## 2022-05-23 ENCOUNTER — Encounter: Payer: Self-pay | Admitting: Internal Medicine

## 2022-05-23 ENCOUNTER — Ambulatory Visit (INDEPENDENT_AMBULATORY_CARE_PROVIDER_SITE_OTHER): Payer: 59 | Admitting: Internal Medicine

## 2022-05-23 VITALS — BP 126/70 | HR 89 | Temp 98.2°F | Resp 16 | Ht 63.0 in | Wt 225.2 lb

## 2022-05-23 DIAGNOSIS — I480 Paroxysmal atrial fibrillation: Secondary | ICD-10-CM

## 2022-05-23 DIAGNOSIS — E7800 Pure hypercholesterolemia, unspecified: Secondary | ICD-10-CM | POA: Diagnosis not present

## 2022-05-23 DIAGNOSIS — G4733 Obstructive sleep apnea (adult) (pediatric): Secondary | ICD-10-CM

## 2022-05-23 DIAGNOSIS — G7000 Myasthenia gravis without (acute) exacerbation: Secondary | ICD-10-CM | POA: Diagnosis not present

## 2022-05-23 DIAGNOSIS — C37 Malignant neoplasm of thymus: Secondary | ICD-10-CM

## 2022-05-23 DIAGNOSIS — E039 Hypothyroidism, unspecified: Secondary | ICD-10-CM | POA: Diagnosis not present

## 2022-05-23 DIAGNOSIS — F39 Unspecified mood [affective] disorder: Secondary | ICD-10-CM

## 2022-05-23 DIAGNOSIS — S62357A Nondisplaced fracture of shaft of fifth metacarpal bone, left hand, initial encounter for closed fracture: Secondary | ICD-10-CM | POA: Diagnosis not present

## 2022-05-23 DIAGNOSIS — R739 Hyperglycemia, unspecified: Secondary | ICD-10-CM

## 2022-05-23 DIAGNOSIS — D4989 Neoplasm of unspecified behavior of other specified sites: Secondary | ICD-10-CM

## 2022-05-23 DIAGNOSIS — R296 Repeated falls: Secondary | ICD-10-CM

## 2022-05-23 DIAGNOSIS — D696 Thrombocytopenia, unspecified: Secondary | ICD-10-CM

## 2022-05-23 DIAGNOSIS — C642 Malignant neoplasm of left kidney, except renal pelvis: Secondary | ICD-10-CM | POA: Diagnosis not present

## 2022-05-23 DIAGNOSIS — I10 Essential (primary) hypertension: Secondary | ICD-10-CM | POA: Diagnosis not present

## 2022-05-23 DIAGNOSIS — S0242XA Fracture of alveolus of maxilla, initial encounter for closed fracture: Secondary | ICD-10-CM

## 2022-05-23 DIAGNOSIS — Z1231 Encounter for screening mammogram for malignant neoplasm of breast: Secondary | ICD-10-CM

## 2022-05-23 DIAGNOSIS — G40909 Epilepsy, unspecified, not intractable, without status epilepticus: Secondary | ICD-10-CM

## 2022-05-23 DIAGNOSIS — L89629 Pressure ulcer of left heel, unspecified stage: Secondary | ICD-10-CM

## 2022-05-23 DIAGNOSIS — T148XXA Other injury of unspecified body region, initial encounter: Secondary | ICD-10-CM

## 2022-05-23 MED ORDER — TOLTERODINE TARTRATE ER 4 MG PO CP24: ORAL_CAPSULE | ORAL | 1 refills | Status: AC

## 2022-05-23 MED ORDER — LEVOTHYROXINE SODIUM 75 MCG PO TABS: ORAL_TABLET | ORAL | 1 refills | Status: AC

## 2022-05-23 NOTE — Progress Notes (Unsigned)
Subjective:    Patient ID: Madison Burns, female    DOB: 1950/05/26, 72 y.o.   MRN: [id number]  Patient here for  Chief Complaint  Patient presents with   Medical Management of Chronic Issues    HPI Here for a scheduled follow up.  She is accompanied by her sister.  History obtained from both of them.  Recently admitted 04/01/22 - 04/10/22 - after falling - left hand swelling and pain and found to have fifth metacarpal fracture. Ortho consulted and recommended ulnar gutter splint and f/u as outpatient.  Hgb decreased to 6.3.  transfused.  Hgb initially improved, but then dropped again to 6.9 04/04/22 - transfused and again dropped on 04/07/22 - transfused.  Was noted on CT scan to have 12.7 cm intramuscular and subcutaneous hematoma within the right gluteal region.also had problems with low sodium.  Was started on salt tablets.  These were stopped prior to discharge.  Lactic acidosis resolved with IV hydration.  Remained on diltiazem and amiodarone for afib.  Discharged to skilled nursing.  Saw Dr Cathie Hoops 05/15/22 - f/u thymoma - stable.  Recommended CT surveillance - f/u one year.  Also f/u Left  RCC, patient is status post cryoablation of the lesion.  05/02/2021, MRI with and without contrast showed stable post ablation appearance of the left kidney.  No suspicious nodularity/enhancement identified.  No Lymphadenopathy  Past Medical History:  Diagnosis Date   Cancer of kidney 2021   Complication of anesthesia    Myasthenia gravis   GERD (gastroesophageal reflux disease)    History of seizure disorder    Hypercholesterolemia    Hypertension    no longer has it. wt loss   Hypokalemia 04/10/2022   Hyponatremia 04/10/2022   Hypothyroidism    Irritable bowel syndrome    Lactic acidosis 04/10/2022   Melanoma 2017   Melanoma on neck   Myasthenia gravis 2011   OSA on CPAP    Thymoma, malignant    Past Surgical History:  Procedure Laterality Date   ABDOMINAL HYSTERECTOMY  1990   APPENDECTOMY   1999   CHOLECYSTECTOMY  2013   COLONOSCOPY WITH PROPOFOL N/A 06/30/2017   Procedure: COLONOSCOPY WITH PROPOFOL;  Surgeon: Toledo, Boykin Nearing, MD;  Location: [facility];  Service: Gastroenterology;  Laterality: N/A;   CYSTOSCOPY W/ URETERAL STENT PLACEMENT Left 08/17/2019   Procedure: CYSTOSCOPY WITH RETROGRADE PYELOGRAM/URETERAL STENT PLACEMENT;  Surgeon: Sebastian Ache, MD;  Location: [facility];  Service: Urology;  Laterality: Left;  30 MINS   ESOPHAGOGASTRODUODENOSCOPY (EGD) WITH PROPOFOL N/A 06/30/2017   Procedure: ESOPHAGOGASTRODUODENOSCOPY (EGD) WITH PROPOFOL;  Surgeon: Toledo, Boykin Nearing, MD;  Location: [facility];  Service: Gastroenterology;  Laterality: N/A;   IR RADIOLOGIST EVAL & MGMT  06/28/2019   IR RADIOLOGIST EVAL & MGMT  09/14/2019   IR RADIOLOGIST EVAL & MGMT  03/28/2020   IR RADIOLOGIST EVAL & MGMT  10/10/2020   IR RADIOLOGIST EVAL & MGMT  11/22/2021   RADIOFREQUENCY ABLATION Left 08/17/2019   Procedure: LEFT RENAL CRYO ABLATION;  Surgeon: Malachy Moan, MD;  Location: [facility];  Service: Anesthesiology;  Laterality: Left;   TONSILLECTOMY  1959   Family History  Problem Relation Age of Onset   Colon cancer Maternal Grandfather        stomach/colon   Heart disease Father        myocardial infarction   Hyperlipidemia Father    Hyperlipidemia Sister    Diabetes Sister    Bone cancer Other  cousin   Alzheimer's disease Mother        maternal aunts   Skin cancer Mother    Myasthenia gravis Brother        ocular   Skin cancer Brother    Stroke Paternal Grandfather    Breast cancer Neg Hx    Social History   Socioeconomic History   Marital status: Single    Spouse name: Not on file   Number of children: 0   Years of education: Special Ed   Highest education level: Not on file  Occupational History   Occupation: Unemployed  Tobacco Use   Smoking status: Never   Smokeless tobacco: Never  Vaping Use   Vaping Use: Never used  Substance and Sexual Activity    Alcohol use: No    Alcohol/week: 0.0 standard drinks of alcohol   Drug use: No   Sexual activity: Not Currently  Other Topics Concern   Not on file  Social History Narrative   06/23/17 lives with sister, Harriett Sine   Regular exercise-no   Caffeine Use-yes   Social Determinants of Health   Financial Resource Strain: Not on file  Food Insecurity: No Food Insecurity (05/06/2022)   Hunger Vital Sign    Worried About Running Out of Food in the Last Year: Never true    Ran Out of Food in the Last Year: Never true  Transportation Needs: No Transportation Needs (05/06/2022)   PRAPARE - Administrator, Civil Service (Medical): No    Lack of Transportation (Non-Medical): No  Physical Activity: Not on file  Stress: Not on file  Social Connections: Not on file     Review of Systems     Objective:     BP 126/70   Pulse 89   Temp 98.2 F (36.8 C)   Resp 16   Ht  (1.6 m)   Wt 225 lb 3.2 oz (102.2 kg)   SpO2 98%   BMI 39.89 kg/m  Wt Readings from Last 3 Encounters:  05/23/22 225 lb 3.2 oz (102.2 kg)  05/15/22 216 lb 6.4 oz (98.2 kg)  04/01/22 233 lb 11 oz (106 kg)    Physical Exam   Outpatient Encounter Medications as of 05/23/2022  Medication Sig   acetaminophen (TYLENOL) 325 MG tablet Take 2 tablets (650 mg total) by mouth every 6 (six) hours as needed for mild pain (or Fever >/= 101).   amiodarone (PACERONE) 200 MG tablet Take 1 tablet (200 mg total) by mouth daily.   Ascorbic Acid (VITAMIN C) 1000 MG tablet Take 1,000 mg by mouth at bedtime.   B Complex-C (B-COMPLEX WITH VITAMIN C) tablet Take 1 tablet by mouth every evening.   benzonatate (TESSALON) 100 MG capsule TAKE 1 CAPSULE BY MOUTH TWICE DAILY AS NEEDED FOR COUGH   Biotin 16109 MCG TABS Take 10,000 mcg by mouth daily at 2 PM. (1430)   Calcium-Phosphorus-Vitamin D (CITRACAL +D3 PO) Take 1 tablet by mouth daily.   desmopressin (DDAVP NASAL) 0.01 % solution Place 1 spray (10 mcg total) into the nose 2  (two) times daily.   diltiazem (CARDIZEM CD) 180 MG 24 hr capsule Take 180 mg by mouth daily.   DULoxetine (CYMBALTA) 30 MG capsule TAKE 1 CAPSULE BY MOUTH ONCE DAILY   folic acid (FOLVITE) 1 MG tablet TAKE 2 TABLETS BY MOUTH ONCE DAILY   Garlic 1000 MG CAPS Take 1,000 mg by mouth in the morning, at noon, and at bedtime. (0800, 1430, 2000)  levothyroxine (SYNTHROID) 75 MCG tablet TAKE 1 TABLET BY MOUTH ONCE DAILY ON AN EMPTY STOMACH. WAIT 30 MINUTES BEFORE TAKING OTHER MEDS.   magnesium hydroxide (MILK OF MAGNESIA) 400 MG/5ML suspension Take 30 mLs by mouth daily as needed for mild constipation.   methotrexate (RHEUMATREX) 2.5 MG tablet TAKE 4 TABLETS BY MOUTH ONCE A WEEK WITHFOLIC ACID   pantoprazole (PROTONIX) 40 MG tablet Take 40 mg by mouth daily.    Probiotic Product (PROBIOTIC MULTI-ENZYME PO) Take 1 capsule by mouth daily. Probiotic Multi-Enzyme Digestive Formula   pyridostigmine (MESTINON) 60 MG tablet TAKE 1 TABLET BY MOUTH 3 TIMES DAILY   risperiDONE (RISPERDAL) 1 MG tablet Take 1 mg by mouth at bedtime.    tolterodine (DETROL LA) 4 MG 24 hr capsule TAKE 1 CAPSULE BY MOUTH ONCE EVERY MORNING   vitamin B-12 (CYANOCOBALAMIN) 250 MCG tablet Take 250 mcg by mouth in the morning, at noon, and at bedtime.   vitamin E 180 MG (400 UNITS) capsule Take 400 Units by mouth daily at 2 PM. 1430   [DISCONTINUED] levothyroxine (SYNTHROID) 75 MCG tablet TAKE 1 TABLET BY MOUTH ONCE DAILY ON AN EMPTY STOMACH. WAIT 30 MINUTES BEFORE TAKING OTHER MEDS.   [DISCONTINUED] tolterodine (DETROL LA) 4 MG 24 hr capsule TAKE 1 CAPSULE BY MOUTH ONCE EVERY MORNING   No facility-administered encounter medications on file as of 05/23/2022.     Lab Results  Component Value Date   WBC 7.0 05/15/2022   HGB 12.3 05/15/2022   HCT 38.3 05/15/2022   PLT 195 05/15/2022   GLUCOSE 97 05/15/2022   CHOL 238 (H) 01/08/2022   TRIG 129.0 01/08/2022   HDL 60.60 01/08/2022   LDLDIRECT 122.0 12/14/2018   LDLCALC 152 (H)  01/08/2022   ALT 20 05/15/2022   AST 32 05/15/2022   NA 133 (L) 05/15/2022   K 3.6 05/15/2022   CL 100 05/15/2022   CREATININE 1.02 (H) 05/15/2022   BUN 18 05/15/2022   CO2 25 05/15/2022   TSH 4.346 04/01/2022   INR 1.1 04/02/2022   HGBA1C 5.6 01/08/2022    MR Abdomen W Wo Contrast  Result Date: 05/16/2022 CLINICAL DATA:  History of thymoma and left renal cell carcinoma status post cryoablation 08/17/2019 for surveillance EXAM: MRI ABDOMEN WITHOUT AND WITH CONTRAST TECHNIQUE: Multiplanar multisequence MR imaging of the abdomen was performed both before and after the administration of intravenous contrast. CONTRAST:  15mL GADAVIST GADOBUTROL 1 MMOL/ML IV SOLN COMPARISON:  CT pelvis dated 04/07/2022, MR abdomen dated 11/12/2021 FINDINGS: Lower chest: No acute findings. Hepatobiliary: No mass or other parenchymal abnormality identified. No bile duct dilation. Normal gallbladder. Pancreas: No mass, inflammatory changes, or other parenchymal abnormality identified. Spleen:  Within normal limits in size and appearance. Adrenals/Urinary Tract: Similar bilateral adrenal adenomas. No specific follow-up imaging recommended. Left renal lower pole posttreatment changes without abnormal nodular enhancement to suggest recurrence. No new suspicious renal masses identified. Simple left renal cysts. No specific follow-up imaging recommended. No evidence of hydronephrosis. Stomach/Bowel: Visualized portions within the abdomen are unremarkable. Vascular/Lymphatic: No pathologically enlarged lymph nodes identified. No abdominal aortic aneurysm demonstrated. Other:  None. Musculoskeletal: Focal enhancement of the right lateral tenth rib, likely reflecting age indeterminate fracture. Partially imaged lobulated right gluteal signal abnormality corresponding to previously identified right gluteal hematoma. Small fat-containing paraumbilical hernia. IMPRESSION: 1. Left renal lower pole posttreatment changes without definite  recurrence. Postcontrast sensitivity and specificity are mildly decreased due to motion artifact. No new suspicious renal masses identified. 2. Focal enhancement of  the right lateral tenth rib, likely reflecting age indeterminate fracture. 3. Partially imaged lobulated right gluteal signal abnormality corresponding to previously identified right gluteal hematoma. Electronically Signed   By: Agustin CreeLimin  Xu M.D.   On: 05/16/2022 15:14       Assessment & Plan:  Visit for screening mammogram -     3D Screening Mammogram, Left and Right; Future  Other orders -     Levothyroxine Sodium; TAKE 1 TABLET BY MOUTH ONCE DAILY ON AN EMPTY STOMACH. WAIT 30 MINUTES BEFORE TAKING OTHER MEDS.  Dispense: 90 tablet; Refill: 1 -     Tolterodine Tartrate ER; TAKE 1 CAPSULE BY MOUTH ONCE EVERY MORNING  Dispense: 90 capsule; Refill: 1     Dale Durhamharlene Gary Gabrielsen, MD

## 2022-05-23 NOTE — Telephone Encounter (Signed)
Pt was seen today. Orders have been sent

## 2022-05-24 ENCOUNTER — Encounter: Payer: Self-pay | Admitting: Internal Medicine

## 2022-05-24 DIAGNOSIS — T148XXA Other injury of unspecified body region, initial encounter: Secondary | ICD-10-CM | POA: Insufficient documentation

## 2022-05-24 DIAGNOSIS — S0242XA Fracture of alveolus of maxilla, initial encounter for closed fracture: Secondary | ICD-10-CM | POA: Insufficient documentation

## 2022-05-24 DIAGNOSIS — L8990 Pressure ulcer of unspecified site, unspecified stage: Secondary | ICD-10-CM | POA: Insufficient documentation

## 2022-05-24 NOTE — Assessment & Plan Note (Signed)
Declines cholesterol medication.  Discussed again today. Low cholesterol diet and exercise.  Follow lipid panel.  

## 2022-05-24 NOTE — Assessment & Plan Note (Signed)
Followed by oncology.  S/p chemo and XRT.   °

## 2022-05-24 NOTE — Assessment & Plan Note (Signed)
Followed by oncology 

## 2022-05-24 NOTE — Assessment & Plan Note (Signed)
On thyroid replacement.  Follow tsh.  

## 2022-05-24 NOTE — Assessment & Plan Note (Signed)
Saw urology (Dr Brandon) - 05/2021 - recommended f/u MRI in one year.  

## 2022-05-24 NOTE — Assessment & Plan Note (Signed)
Blood pressure has been doing well. On oral cardizem.  Follow pressures and metabolic panel.  

## 2022-05-24 NOTE — Assessment & Plan Note (Signed)
Followed by neurology.   

## 2022-05-24 NOTE — Assessment & Plan Note (Signed)
Seeing cardiology.  On amiodarone and cardizem.  Appears to be in SR.  Follow.  

## 2022-05-24 NOTE — Assessment & Plan Note (Signed)
Neurology 12/2021 - continue mestinon, methotrexate, folic acid and DDVAP.

## 2022-05-24 NOTE — Assessment & Plan Note (Signed)
Followed by neurology.  Stable  

## 2022-05-24 NOTE — Assessment & Plan Note (Signed)
Low carb diet and exercise.  Follow met b and a1c.   Lab Results  Component Value Date   HGBA1C 5.6 01/08/2022   

## 2022-05-24 NOTE — Assessment & Plan Note (Signed)
Doing better.  Followed by podiatry.

## 2022-05-24 NOTE — Assessment & Plan Note (Signed)
Gluteal hematoma.  Resolving.  Follow.

## 2022-05-24 NOTE — Assessment & Plan Note (Signed)
Ortho consulted and recommended ulnar gutter splint and f/u as outpatient.

## 2022-05-24 NOTE — Assessment & Plan Note (Signed)
Recent falls as outlined.  Recent in rehab - PT.  Discharged home.  Continue home PT, OT and nursing evaluation.

## 2022-05-26 ENCOUNTER — Encounter: Payer: Self-pay | Admitting: Podiatry

## 2022-05-26 NOTE — Progress Notes (Signed)
  Subjective:  Patient ID: Madison Burns, female    DOB: 1950-06-06,  MRN: [id number]  Chief Complaint  Patient presents with   Nail Problem    (np) Routine foot care-nail trim( req  office) aware maybe transitioned to [facility] provider in the future..dm   Foot Ulcer    She has a small, scabbed over ulcer on her left heel    72 y.o. female presents with the above complaint. History confirmed with patient.  Nails are thickened and elongated.  She is here with her sister.  Objective:  Physical Exam: warm, good capillary refill, normal DP and PT pulses, and deep tissue injury left plantar heel, no signs of infection or ulceration. Left Foot: dystrophic yellowed discolored nail plates with subungual debris Right Foot: dystrophic yellowed discolored nail plates with subungual debris     Assessment:   1. Pain due to onychomycosis of toenails of both feet   2. Pressure injury of deep tissue of left heel      Plan:  Patient was evaluated and treated and all questions answered.  Discussed the etiology and treatment options for the condition in detail with the patient. Educated patient on the topical and oral treatment options for mycotic nails. Recommended debridement of the nails today. Sharp and mechanical debridement performed of all painful and mycotic nails today. Nails debrided in length and thickness using a nail nipper to level of comfort. Discussed treatment options including appropriate shoe gear. Follow up as needed for painful nails.   Recommended offloading of the heels and we discussed pressure injuries in general.  Discussed the importance of repositioning, offloading with a pillow or cushion heel boot.  They will get this on [employer].  I will see her back in 1 month to reevaluate this.  Return in about 1 month (around 06/20/2022) for check L heel wound.

## 2022-05-28 ENCOUNTER — Telehealth: Payer: Self-pay | Admitting: Internal Medicine

## 2022-05-28 ENCOUNTER — Ambulatory Visit (INDEPENDENT_AMBULATORY_CARE_PROVIDER_SITE_OTHER): Payer: 59 | Admitting: Urology

## 2022-05-28 VITALS — BP 143/81 | HR 87 | Ht 63.0 in | Wt 225.0 lb

## 2022-05-28 DIAGNOSIS — C642 Malignant neoplasm of left kidney, except renal pelvis: Secondary | ICD-10-CM

## 2022-05-28 NOTE — Telephone Encounter (Signed)
Contacted Madison Burns to schedule their annual wellness visit. Appointment made for 05/30/2022.  Thank you,  [facility] Support [facility] Direct dial  [phone]

## 2022-05-28 NOTE — Progress Notes (Signed)
Madison Burns,acting as a scribe for Madison Scotland, MD.,have documented all relevant documentation on the behalf of Madison Scotland, MD,as directed by  Madison Scotland, MD while in the presence of Madison Scotland, MD.  05/28/2022 5:02 PM   Madison Burns 1950-12-18 [id number]  Referring provider: Dale Murray Hill, MD [address] [place],  [place] [address]  Chief Complaint  Patient presents with   renal cell cancer    HPI: 72 year-old female with a personal history of left renal cell carcinoma, status post cryoablation in 2021. Biopsy was consistent with renal cell carcinoma. Lesion at the time was approximately 4 cm, She did require a ureteral stent placement at the time based on the proximity to the ureter, which was subsequently removed. She has been followed with serial imaging since this time. Her most recent study was in the form of an MRI. MRI shows postoperative changes of the left power pole mass somewhat limited secondary to a motion artifact.   She also had a CT of the chest abdomen pelvis on 04/02/2022 after sustaining a fall and interval development of a gluteal hematoma. She also mentions sustaining injuries from falls in the past, including one that resulted in black eyes and significant bruising that affected her hemoglobin levels.  Today, she states that she is working on [employer] and plans to increase physical activity.   PMH: Past Medical History:  Diagnosis Date   Cancer of kidney 2021   Complication of anesthesia    Myasthenia gravis   GERD (gastroesophageal reflux disease)    History of seizure disorder    Hypercholesterolemia    Hypertension    no longer has it. wt loss   Hypokalemia 04/10/2022   Hyponatremia 04/10/2022   Hypothyroidism    Irritable bowel syndrome    Lactic acidosis 04/10/2022   Melanoma 2017   Melanoma on neck   Myasthenia gravis 2011   OSA on CPAP    Thymoma, malignant     Surgical History: Past  Surgical History:  Procedure Laterality Date   ABDOMINAL HYSTERECTOMY  1990   APPENDECTOMY  1999   CHOLECYSTECTOMY  2013   COLONOSCOPY WITH PROPOFOL N/A 06/30/2017   Procedure: COLONOSCOPY WITH PROPOFOL;  Surgeon: Toledo, Boykin Nearing, MD;  Location: [facility];  Service: Gastroenterology;  Laterality: N/A;   CYSTOSCOPY W/ URETERAL STENT PLACEMENT Left 08/17/2019   Procedure: CYSTOSCOPY WITH RETROGRADE PYELOGRAM/URETERAL STENT PLACEMENT;  Surgeon: Sebastian Ache, MD;  Location: [facility];  Service: Urology;  Laterality: Left;  30 MINS   ESOPHAGOGASTRODUODENOSCOPY (EGD) WITH PROPOFOL N/A 06/30/2017   Procedure: ESOPHAGOGASTRODUODENOSCOPY (EGD) WITH PROPOFOL;  Surgeon: Toledo, Boykin Nearing, MD;  Location: [facility];  Service: Gastroenterology;  Laterality: N/A;   IR RADIOLOGIST EVAL & MGMT  06/28/2019   IR RADIOLOGIST EVAL & MGMT  09/14/2019   IR RADIOLOGIST EVAL & MGMT  03/28/2020   IR RADIOLOGIST EVAL & MGMT  10/10/2020   IR RADIOLOGIST EVAL & MGMT  11/22/2021   RADIOFREQUENCY ABLATION Left 08/17/2019   Procedure: LEFT RENAL CRYO ABLATION;  Surgeon: Malachy Moan, MD;  Location: [facility];  Service: Anesthesiology;  Laterality: Left;   TONSILLECTOMY  1959    Home Medications:  Allergies as of 05/28/2022   No Known Allergies      Medication List        Accurate as of May 28, 2022  5:02 PM. If you have any questions, ask your nurse or doctor.  acetaminophen 325 MG tablet Commonly known as: TYLENOL Take 2 tablets (650 mg total) by mouth every 6 (six) hours as needed for mild pain (or Fever >/= 101).   amiodarone 200 MG tablet Commonly known as: PACERONE Take 1 tablet (200 mg total) by mouth daily.   B-complex with vitamin C tablet Take 1 tablet by mouth every evening.   benzonatate 100 MG capsule Commonly known as: TESSALON TAKE 1 CAPSULE BY MOUTH TWICE DAILY AS NEEDED FOR COUGH   Biotin 16109 MCG Tabs Take 10,000 mcg by mouth daily at 2 PM. (1430)   CITRACAL +D3  PO Take 1 tablet by mouth daily.   desmopressin 0.01 % solution Commonly known as: DDAVP NASAL Place 1 spray (10 mcg total) into the nose 2 (two) times daily.   diltiazem 180 MG 24 hr capsule Commonly known as: CARDIZEM CD Take 180 mg by mouth daily.   DULoxetine 30 MG capsule Commonly known as: CYMBALTA TAKE 1 CAPSULE BY MOUTH ONCE DAILY   folic acid 1 MG tablet Commonly known as: FOLVITE TAKE 2 TABLETS BY MOUTH ONCE DAILY   Garlic 1000 MG Caps Take 1,000 mg by mouth in the morning, at noon, and at bedtime. (0800, 1430, 2000)   levothyroxine 75 MCG tablet Commonly known as: SYNTHROID TAKE 1 TABLET BY MOUTH ONCE DAILY ON AN EMPTY STOMACH. WAIT 30 MINUTES BEFORE TAKING OTHER MEDS.   magnesium hydroxide 400 MG/5ML suspension Commonly known as: MILK OF MAGNESIA Take 30 mLs by mouth daily as needed for mild constipation.   methotrexate 2.5 MG tablet Commonly known as: RHEUMATREX TAKE 4 TABLETS BY MOUTH ONCE A WEEK WITHFOLIC ACID   pantoprazole 40 MG tablet Commonly known as: PROTONIX Take 40 mg by mouth daily.   PROBIOTIC MULTI-ENZYME PO Take 1 capsule by mouth daily. Probiotic Multi-Enzyme Digestive Formula   pyridostigmine 60 MG tablet Commonly known as: MESTINON TAKE 1 TABLET BY MOUTH 3 TIMES DAILY   risperiDONE 1 MG tablet Commonly known as: RISPERDAL Take 1 mg by mouth at bedtime.   tolterodine 4 MG 24 hr capsule Commonly known as: DETROL LA TAKE 1 CAPSULE BY MOUTH ONCE EVERY MORNING   vitamin B-12 250 MCG tablet Commonly known as: CYANOCOBALAMIN Take 250 mcg by mouth in the morning, at noon, and at bedtime.   vitamin C 1000 MG tablet Take 1,000 mg by mouth at bedtime.   vitamin E 180 MG (400 UNITS) capsule Take 400 Units by mouth daily at 2 PM. 1430        Family History: Family History  Problem Relation Age of Onset   Colon cancer Maternal Grandfather        stomach/colon   Heart disease Father        myocardial infarction   Hyperlipidemia  Father    Hyperlipidemia Sister    Diabetes Sister    Bone cancer Other        cousin   Alzheimer's disease Mother        maternal aunts   Skin cancer Mother    Myasthenia gravis Brother        ocular   Skin cancer Brother    Stroke Paternal Grandfather    Breast cancer Neg Hx     Social History:  reports that she has never smoked. She has never used smokeless tobacco. She reports that she does not drink alcohol and does not use drugs.   Physical Exam: BP (!) 143/81   Pulse 87   Ht 5\' 3"  (  1.6 m)   Wt 225 lb (102.1 kg)   BMI 39.86 kg/m   Constitutional:  Alert and oriented, No acute distress. HEENT: [place] AT, moist mucus membranes.  Trachea midline, no masses. Neurologic: Grossly intact, no focal deficits, moving all 4 extremities. Psychiatric: Normal mood and affect.  Pertinent Imaging: EXAM: MRI ABDOMEN WITHOUT AND WITH CONTRAST   TECHNIQUE: Multiplanar multisequence MR imaging of the abdomen was performed both before and after the administration of intravenous contrast.   CONTRAST:  9mL GADAVIST GADOBUTROL 1 MMOL/ML IV SOLN   COMPARISON:  CT pelvis dated 04/07/2022, MR abdomen dated 11/12/2021   FINDINGS: Lower chest: No acute findings.   Hepatobiliary: No mass or other parenchymal abnormality identified. No bile duct dilation. Normal gallbladder.   Pancreas: No mass, inflammatory changes, or other parenchymal abnormality identified.   Spleen:  Within normal limits in size and appearance.   Adrenals/Urinary Tract: Similar bilateral adrenal adenomas. No specific follow-up imaging recommended. Left renal lower pole posttreatment changes without abnormal nodular enhancement to suggest recurrence. No new suspicious renal masses identified. Simple left renal cysts. No specific follow-up imaging recommended. No evidence of hydronephrosis.   Stomach/Bowel: Visualized portions within the abdomen are unremarkable.   Vascular/Lymphatic: No pathologically enlarged  lymph nodes identified. No abdominal aortic aneurysm demonstrated.   Other:  None.   Musculoskeletal: Focal enhancement of the right lateral tenth rib, likely reflecting age indeterminate fracture. Partially imaged lobulated right gluteal signal abnormality corresponding to previously identified right gluteal hematoma. Small fat-containing paraumbilical hernia.   IMPRESSION: 1. Left renal lower pole posttreatment changes without definite recurrence. Postcontrast sensitivity and specificity are mildly decreased due to motion artifact. No new suspicious renal masses identified. 2. Focal enhancement of the right lateral tenth rib, likely reflecting age indeterminate fracture. 3. Partially imaged lobulated right gluteal signal abnormality corresponding to previously identified right gluteal hematoma.     Electronically Signed   By: Agustin Cree M.D.   On: 05/16/2022 15:14  This was personally reviewed and I agree with the radiologic interpretation.   Assessment & Plan:    1. Renal cell cancer - Lesion appears stable, post ablative changes without recurrence - Plan to continue annual surveillance with MRI of the abdomen with contrast for a total of five years post-treatment, acknowledging this is year three.  Return in about 1 year (around 05/28/2023) for repeat MRI abdomen.  I have reviewed the above documentation for accuracy and completeness, and I agree with the above.   Madison Scotland, MD    [facility] [address] [place], [place] [address] [phone]

## 2022-05-29 ENCOUNTER — Telehealth: Payer: Self-pay | Admitting: Internal Medicine

## 2022-05-29 DIAGNOSIS — I10 Essential (primary) hypertension: Secondary | ICD-10-CM | POA: Diagnosis not present

## 2022-05-29 DIAGNOSIS — K760 Fatty (change of) liver, not elsewhere classified: Secondary | ICD-10-CM | POA: Diagnosis not present

## 2022-05-29 DIAGNOSIS — K589 Irritable bowel syndrome without diarrhea: Secondary | ICD-10-CM | POA: Diagnosis not present

## 2022-05-29 DIAGNOSIS — E869 Volume depletion, unspecified: Secondary | ICD-10-CM | POA: Diagnosis not present

## 2022-05-29 DIAGNOSIS — K219 Gastro-esophageal reflux disease without esophagitis: Secondary | ICD-10-CM | POA: Diagnosis not present

## 2022-05-29 DIAGNOSIS — E039 Hypothyroidism, unspecified: Secondary | ICD-10-CM | POA: Diagnosis not present

## 2022-05-29 DIAGNOSIS — Z85238 Personal history of other malignant neoplasm of thymus: Secondary | ICD-10-CM | POA: Diagnosis not present

## 2022-05-29 DIAGNOSIS — R911 Solitary pulmonary nodule: Secondary | ICD-10-CM | POA: Diagnosis not present

## 2022-05-29 DIAGNOSIS — E7800 Pure hypercholesterolemia, unspecified: Secondary | ICD-10-CM | POA: Diagnosis not present

## 2022-05-29 DIAGNOSIS — G4733 Obstructive sleep apnea (adult) (pediatric): Secondary | ICD-10-CM | POA: Diagnosis not present

## 2022-05-29 DIAGNOSIS — Z85828 Personal history of other malignant neoplasm of skin: Secondary | ICD-10-CM | POA: Diagnosis not present

## 2022-05-29 DIAGNOSIS — Z85528 Personal history of other malignant neoplasm of kidney: Secondary | ICD-10-CM | POA: Diagnosis not present

## 2022-05-29 DIAGNOSIS — I480 Paroxysmal atrial fibrillation: Secondary | ICD-10-CM | POA: Diagnosis not present

## 2022-05-29 DIAGNOSIS — L89626 Pressure-induced deep tissue damage of left heel: Secondary | ICD-10-CM | POA: Diagnosis not present

## 2022-05-29 DIAGNOSIS — M7981 Nontraumatic hematoma of soft tissue: Secondary | ICD-10-CM | POA: Diagnosis not present

## 2022-05-29 DIAGNOSIS — G7000 Myasthenia gravis without (acute) exacerbation: Secondary | ICD-10-CM | POA: Diagnosis not present

## 2022-05-29 DIAGNOSIS — N3281 Overactive bladder: Secondary | ICD-10-CM | POA: Diagnosis not present

## 2022-05-29 DIAGNOSIS — E871 Hypo-osmolality and hyponatremia: Secondary | ICD-10-CM | POA: Diagnosis not present

## 2022-05-29 DIAGNOSIS — S62357D Nondisplaced fracture of shaft of fifth metacarpal bone, left hand, subsequent encounter for fracture with routine healing: Secondary | ICD-10-CM | POA: Diagnosis not present

## 2022-05-29 NOTE — Telephone Encounter (Signed)
Copied from CRM [id number]. Topic: Medicare AWV >> May 29, 2022 12:59 PM Rushie Goltz wrote: Reason for CRM: I left a message on vm to confirm AWV phone call on 05/30/2022 at 2:00.

## 2022-05-30 ENCOUNTER — Ambulatory Visit (INDEPENDENT_AMBULATORY_CARE_PROVIDER_SITE_OTHER): Payer: 59

## 2022-05-30 VITALS — Ht 63.0 in | Wt 225.0 lb

## 2022-05-30 DIAGNOSIS — I10 Essential (primary) hypertension: Secondary | ICD-10-CM | POA: Diagnosis not present

## 2022-05-30 DIAGNOSIS — Z85238 Personal history of other malignant neoplasm of thymus: Secondary | ICD-10-CM | POA: Diagnosis not present

## 2022-05-30 DIAGNOSIS — Z0000 Encounter for general adult medical examination without abnormal findings: Secondary | ICD-10-CM

## 2022-05-30 DIAGNOSIS — K219 Gastro-esophageal reflux disease without esophagitis: Secondary | ICD-10-CM | POA: Diagnosis not present

## 2022-05-30 DIAGNOSIS — L89626 Pressure-induced deep tissue damage of left heel: Secondary | ICD-10-CM | POA: Diagnosis not present

## 2022-05-30 DIAGNOSIS — N3281 Overactive bladder: Secondary | ICD-10-CM | POA: Diagnosis not present

## 2022-05-30 DIAGNOSIS — E871 Hypo-osmolality and hyponatremia: Secondary | ICD-10-CM | POA: Diagnosis not present

## 2022-05-30 DIAGNOSIS — M7981 Nontraumatic hematoma of soft tissue: Secondary | ICD-10-CM | POA: Diagnosis not present

## 2022-05-30 DIAGNOSIS — G7000 Myasthenia gravis without (acute) exacerbation: Secondary | ICD-10-CM | POA: Diagnosis not present

## 2022-05-30 DIAGNOSIS — G4733 Obstructive sleep apnea (adult) (pediatric): Secondary | ICD-10-CM | POA: Diagnosis not present

## 2022-05-30 DIAGNOSIS — I480 Paroxysmal atrial fibrillation: Secondary | ICD-10-CM | POA: Diagnosis not present

## 2022-05-30 DIAGNOSIS — E7800 Pure hypercholesterolemia, unspecified: Secondary | ICD-10-CM | POA: Diagnosis not present

## 2022-05-30 DIAGNOSIS — Z85828 Personal history of other malignant neoplasm of skin: Secondary | ICD-10-CM | POA: Diagnosis not present

## 2022-05-30 DIAGNOSIS — S62357D Nondisplaced fracture of shaft of fifth metacarpal bone, left hand, subsequent encounter for fracture with routine healing: Secondary | ICD-10-CM | POA: Diagnosis not present

## 2022-05-30 DIAGNOSIS — E039 Hypothyroidism, unspecified: Secondary | ICD-10-CM | POA: Diagnosis not present

## 2022-05-30 DIAGNOSIS — K760 Fatty (change of) liver, not elsewhere classified: Secondary | ICD-10-CM | POA: Diagnosis not present

## 2022-05-30 DIAGNOSIS — K589 Irritable bowel syndrome without diarrhea: Secondary | ICD-10-CM | POA: Diagnosis not present

## 2022-05-30 DIAGNOSIS — R911 Solitary pulmonary nodule: Secondary | ICD-10-CM | POA: Diagnosis not present

## 2022-05-30 DIAGNOSIS — Z85528 Personal history of other malignant neoplasm of kidney: Secondary | ICD-10-CM | POA: Diagnosis not present

## 2022-05-30 DIAGNOSIS — E869 Volume depletion, unspecified: Secondary | ICD-10-CM | POA: Diagnosis not present

## 2022-05-30 NOTE — Patient Instructions (Addendum)
Madison Burns , Thank you for taking time to come for your Medicare Wellness Visit. I appreciate your ongoing commitment to your health goals. Please review the following plan we discussed and let me know if I can assist you in the future.   These are the goals we discussed:    This is a list of the screening recommended for you and due dates:  Health Maintenance  Topic Date Due   DEXA scan (bone density measurement)  Never done   COVID-19 Vaccine (3 - Pfizer risk series) 06/08/2022*   Zoster (Shingles) Vaccine (1 of 2) 08/22/2022*   Hepatitis C Screening: USPSTF Recommendation to screen - Ages 18-79 yo.  01/16/2023*   Flu Shot  09/18/2022   Mammogram  03/22/2023   DTaP/Tdap/Td vaccine (2 - Td or Tdap) 02/28/2031   Pneumonia Vaccine  Completed   HPV Vaccine  Aged Out   Colon Cancer Screening  Discontinued  *Topic was postponed. The date shown is not the original due date.    Advanced directives: on file  Conditions/risks identified: none new  Next appointment: Follow up in one year for your annual wellness visit    Preventive Care 65 Years and Older, Female Preventive care refers to lifestyle choices and visits with your health care provider that can promote health and wellness. What does preventive care include? A yearly physical exam. This is also called an annual well check. Dental exams once or twice a year. Routine eye exams. Ask your health care provider how often you should have your eyes checked. Personal lifestyle choices, including: Daily care of your teeth and gums. Regular physical activity. Eating a healthy diet. Avoiding tobacco and drug use. Limiting alcohol use. Practicing safe sex. Taking low-dose aspirin every day. Taking vitamin and mineral supplements as recommended by your health care provider. What happens during an annual well check? The services and screenings done by your health care provider during your annual well check will depend on your age,  overall health, lifestyle risk factors, and family history of disease. Counseling  Your health care provider may ask you questions about your: Alcohol use. Tobacco use. Drug use. Emotional well-being. Home and relationship well-being. Sexual activity. Eating habits. History of falls. Memory and ability to understand (cognition). Work and work Astronomer. Reproductive health. Screening  You may have the following tests or measurements: Height, weight, and BMI. Blood pressure. Lipid and cholesterol levels. These may be checked every 5 years, or more frequently if you are over 43 years old. Skin check. Lung cancer screening. You may have this screening every year starting at age 59 if you have a 30-pack-year history of smoking and currently smoke or have quit within the past 15 years. Fecal occult blood test (FOBT) of the stool. You may have this test every year starting at age 20. Flexible sigmoidoscopy or colonoscopy. You may have a sigmoidoscopy every 5 years or a colonoscopy every 10 years starting at age 74. Hepatitis C blood test. Hepatitis B blood test. Sexually transmitted disease (STD) testing. Diabetes screening. This is done by checking your blood sugar (glucose) after you have not eaten for a while (fasting). You may have this done every 1-3 years. Bone density scan. This is done to screen for osteoporosis. You may have this done starting at age 29. Mammogram. This may be done every 1-2 years. Talk to your health care provider about how often you should have regular mammograms. Talk with your health care provider about your test results, treatment options,  and if necessary, the need for more tests. Vaccines  Your health care provider may recommend certain vaccines, such as: Influenza vaccine. This is recommended every year. Tetanus, diphtheria, and acellular pertussis (Tdap, Td) vaccine. You may need a Td booster every 10 years. Zoster vaccine. You may need this after age  23. Pneumococcal 13-valent conjugate (PCV13) vaccine. One dose is recommended after age 84. Pneumococcal polysaccharide (PPSV23) vaccine. One dose is recommended after age 25. Talk to your health care provider about which screenings and vaccines you need and how often you need them. This information is not intended to replace advice given to you by your health care provider. Make sure you discuss any questions you have with your health care provider. Document Released: 03/02/2015 Document Revised: 10/24/2015 Document Reviewed: 12/05/2014 Elsevier Interactive Patient Education  2017 [place] Prevention in the Home Falls can cause injuries. They can happen to people of all ages. There are many things you can do to make your home safe and to help prevent falls. What can I do on the outside of my home? Regularly fix the edges of walkways and driveways and fix any cracks. Remove anything that might make you trip as you walk through a door, such as a raised step or threshold. Trim any bushes or trees on the path to your home. Use bright outdoor lighting. Clear any walking paths of anything that might make someone trip, such as rocks or tools. Regularly check to see if handrails are loose or broken. Make sure that both sides of any steps have handrails. Any raised decks and porches should have guardrails on the edges. Have any leaves, snow, or ice cleared regularly. Use sand or salt on walking paths during winter. Clean up any spills in your garage right away. This includes oil or grease spills. What can I do in the bathroom? Use night lights. Install grab bars by the toilet and in the tub and shower. Do not use towel bars as grab bars. Use non-skid mats or decals in the tub or shower. If you need to sit down in the shower, use a plastic, non-slip stool. Keep the floor dry. Clean up any water that spills on the floor as soon as it happens. Remove soap buildup in the tub or shower  regularly. Attach bath mats securely with double-sided non-slip rug tape. Do not have throw rugs and other things on the floor that can make you trip. What can I do in the bedroom? Use night lights. Make sure that you have a light by your bed that is easy to reach. Do not use any sheets or blankets that are too big for your bed. They should not hang down onto the floor. Have a firm chair that has side arms. You can use this for support while you get dressed. Do not have throw rugs and other things on the floor that can make you trip. What can I do in the kitchen? Clean up any spills right away. Avoid walking on wet floors. Keep items that you use a lot in easy-to-reach places. If you need to reach something above you, use a strong step stool that has a grab bar. Keep electrical cords out of the way. Do not use floor polish or wax that makes floors slippery. If you must use wax, use non-skid floor wax. Do not have throw rugs and other things on the floor that can make you trip. What can I do with my stairs? Do not leave  any items on the stairs. Make sure that there are handrails on both sides of the stairs and use them. Fix handrails that are broken or loose. Make sure that handrails are as long as the stairways. Check any carpeting to make sure that it is firmly attached to the stairs. Fix any carpet that is loose or worn. Avoid having throw rugs at the top or bottom of the stairs. If you do have throw rugs, attach them to the floor with carpet tape. Make sure that you have a light switch at the top of the stairs and the bottom of the stairs. If you do not have them, ask someone to add them for you. What else can I do to help prevent falls? Wear shoes that: Do not have high heels. Have rubber bottoms. Are comfortable and fit you well. Are closed at the toe. Do not wear sandals. If you use a stepladder: Make sure that it is fully opened. Do not climb a closed stepladder. Make sure that  both sides of the stepladder are locked into place. Ask someone to hold it for you, if possible. Clearly mark and make sure that you can see: Any grab bars or handrails. First and last steps. Where the edge of each step is. Use tools that help you move around (mobility aids) if they are needed. These include: Canes. Walkers. Scooters. Crutches. Turn on the lights when you go into a dark area. Replace any light bulbs as soon as they burn out. Set up your furniture so you have a clear path. Avoid moving your furniture around. If any of your floors are uneven, fix them. If there are any pets around you, be aware of where they are. Review your medicines with your doctor. Some medicines can make you feel dizzy. This can increase your chance of falling. Ask your doctor what other things that you can do to help prevent falls. This information is not intended to replace advice given to you by your health care provider. Make sure you discuss any questions you have with your health care provider. Document Released: 11/30/2008 Document Revised: 07/12/2015 Document Reviewed: 03/10/2014 Elsevier Interactive Patient Education  2017 [employer].

## 2022-05-30 NOTE — Progress Notes (Cosign Needed Addendum)
Subjective:   Madison Burns is a 72 y.o. female who presents for Medicare Annual (Subsequent) preventive examination.  Review of Systems    No ROS.  Medicare Wellness Virtual Visit.  Visual/audio telehealth visit, UTA vital signs.   See social history for additional risk factors.   Cardiac Risk Factors include: advanced age (>56men, >58 women)     Objective:    Today's Vitals   05/30/22 1405  Weight: 225 lb (102.1 kg)  Height:  (1.6 m)   Body mass index is 39.86 kg/m.     05/30/2022    2:14 PM 11/07/2021   11:02 AM 05/14/2021   10:05 AM 11/07/2020    2:09 PM 10/29/2020   10:10 AM 05/03/2020    2:07 PM 04/07/2020    5:00 PM  Advanced Directives  Does Patient Have a Medical Advance Directive? Yes Yes Yes Yes Yes Yes Yes  Type of Estate agent of [place];Living will Healthcare Power of [place];Living will Living will;Healthcare Power of [employer] Power of [employer] Power of [place];Living will Healthcare Power of Attorney   Does patient want to make changes to medical advance directive? No - Patient declined   No - Patient declined Yes (ED - Information included in AVS)    Copy of Healthcare Power of Attorney in Chart? Yes - validated most recent copy scanned in chart (See row information)   No - copy requested  Yes - validated most recent copy scanned in chart (See row information)   Would patient like information on creating a medical advance directive?    No - Patient declined Yes (ED - Information included in AVS) No - Patient declined     Current Medications (verified) Outpatient Encounter Medications as of 05/30/2022  Medication Sig   acetaminophen (TYLENOL) 325 MG tablet Take 2 tablets (650 mg total) by mouth every 6 (six) hours as needed for mild pain (or Fever >/= 101).   amiodarone (PACERONE) 200 MG tablet Take 1 tablet (200 mg total) by mouth daily.   Ascorbic Acid (VITAMIN C) 1000 MG tablet Take 1,000 mg by mouth at  bedtime.   B Complex-C (B-COMPLEX WITH VITAMIN C) tablet Take 1 tablet by mouth every evening.   benzonatate (TESSALON) 100 MG capsule TAKE 1 CAPSULE BY MOUTH TWICE DAILY AS NEEDED FOR COUGH   Biotin 16109 MCG TABS Take 10,000 mcg by mouth daily at 2 PM. (1430)   Calcium-Phosphorus-Vitamin D (CITRACAL +D3 PO) Take 1 tablet by mouth daily.   desmopressin (DDAVP NASAL) 0.01 % solution Place 1 spray (10 mcg total) into the nose 2 (two) times daily.   diltiazem (CARDIZEM CD) 180 MG 24 hr capsule Take 180 mg by mouth daily.   DULoxetine (CYMBALTA) 30 MG capsule TAKE 1 CAPSULE BY MOUTH ONCE DAILY   folic acid (FOLVITE) 1 MG tablet TAKE 2 TABLETS BY MOUTH ONCE DAILY   Garlic 1000 MG CAPS Take 1,000 mg by mouth in the morning, at noon, and at bedtime. (0800, 1430, 2000)   levothyroxine (SYNTHROID) 75 MCG tablet TAKE 1 TABLET BY MOUTH ONCE DAILY ON AN EMPTY STOMACH. WAIT 30 MINUTES BEFORE TAKING OTHER MEDS.   magnesium hydroxide (MILK OF MAGNESIA) 400 MG/5ML suspension Take 30 mLs by mouth daily as needed for mild constipation.   methotrexate (RHEUMATREX) 2.5 MG tablet TAKE 4 TABLETS BY MOUTH ONCE A WEEK WITHFOLIC ACID   pantoprazole (PROTONIX) 40 MG tablet Take 40 mg by mouth daily.    Probiotic Product (PROBIOTIC  MULTI-ENZYME PO) Take 1 capsule by mouth daily. Probiotic Multi-Enzyme Digestive Formula   pyridostigmine (MESTINON) 60 MG tablet TAKE 1 TABLET BY MOUTH 3 TIMES DAILY   risperiDONE (RISPERDAL) 1 MG tablet Take 1 mg by mouth at bedtime.    tolterodine (DETROL LA) 4 MG 24 hr capsule TAKE 1 CAPSULE BY MOUTH ONCE EVERY MORNING   vitamin B-12 (CYANOCOBALAMIN) 250 MCG tablet Take 250 mcg by mouth in the morning, at noon, and at bedtime.   vitamin E 180 MG (400 UNITS) capsule Take 400 Units by mouth daily at 2 PM. 1430   No facility-administered encounter medications on file as of 05/30/2022.    Allergies (verified) Patient has no known allergies.   History: Past Medical History:  Diagnosis  Date   Cancer of kidney 2021   Complication of anesthesia    Myasthenia gravis   GERD (gastroesophageal reflux disease)    History of seizure disorder    Hypercholesterolemia    Hypertension    no longer has it. wt loss   Hypokalemia 04/10/2022   Hyponatremia 04/10/2022   Hypothyroidism    Irritable bowel syndrome    Lactic acidosis 04/10/2022   Melanoma 2017   Melanoma on neck   Myasthenia gravis 2011   OSA on CPAP    Thymoma, malignant    Past Surgical History:  Procedure Laterality Date   ABDOMINAL HYSTERECTOMY  1990   APPENDECTOMY  1999   CHOLECYSTECTOMY  2013   COLONOSCOPY WITH PROPOFOL N/A 06/30/2017   Procedure: COLONOSCOPY WITH PROPOFOL;  Surgeon: Toledo, Boykin Nearing, MD;  Location: [facility];  Service: Gastroenterology;  Laterality: N/A;   CYSTOSCOPY W/ URETERAL STENT PLACEMENT Left 08/17/2019   Procedure: CYSTOSCOPY WITH RETROGRADE PYELOGRAM/URETERAL STENT PLACEMENT;  Surgeon: Sebastian Ache, MD;  Location: [facility];  Service: Urology;  Laterality: Left;  30 MINS   ESOPHAGOGASTRODUODENOSCOPY (EGD) WITH PROPOFOL N/A 06/30/2017   Procedure: ESOPHAGOGASTRODUODENOSCOPY (EGD) WITH PROPOFOL;  Surgeon: Toledo, Boykin Nearing, MD;  Location: [facility];  Service: Gastroenterology;  Laterality: N/A;   IR RADIOLOGIST EVAL & MGMT  06/28/2019   IR RADIOLOGIST EVAL & MGMT  09/14/2019   IR RADIOLOGIST EVAL & MGMT  03/28/2020   IR RADIOLOGIST EVAL & MGMT  10/10/2020   IR RADIOLOGIST EVAL & MGMT  11/22/2021   RADIOFREQUENCY ABLATION Left 08/17/2019   Procedure: LEFT RENAL CRYO ABLATION;  Surgeon: Malachy Moan, MD;  Location: [facility];  Service: Anesthesiology;  Laterality: Left;   TONSILLECTOMY  1959   Family History  Problem Relation Age of Onset   Colon cancer Maternal Grandfather        stomach/colon   Heart disease Father        myocardial infarction   Hyperlipidemia Father    Hyperlipidemia Sister    Diabetes Sister    Bone cancer Other        cousin   Alzheimer's disease  Mother        maternal aunts   Skin cancer Mother    Myasthenia gravis Brother        ocular   Skin cancer Brother    Stroke Paternal Grandfather    Breast cancer Neg Hx    Social History   Socioeconomic History   Marital status: Single    Spouse name: Not on file   Number of children: 0   Years of education: Special Ed   Highest education level: Not on file  Occupational History   Occupation: Unemployed  Tobacco Use   Smoking status: Never  Smokeless tobacco: Never  Vaping Use   Vaping Use: Never used  Substance and Sexual Activity   Alcohol use: No    Alcohol/week: 0.0 standard drinks of alcohol   Drug use: No   Sexual activity: Not Currently  Other Topics Concern   Not on file  Social History Narrative   06/23/17 lives with sister, Harriett Sine   Regular exercise-no   Caffeine Use-yes   Social Determinants of Health   Financial Resource Strain: Low Risk  (05/30/2022)   Overall Financial Resource Strain (CARDIA)    Difficulty of Paying Living Expenses: Not hard at all  Food Insecurity: No Food Insecurity (05/30/2022)   Hunger Vital Sign    Worried About Running Out of Food in the Last Year: Never true    Ran Out of Food in the Last Year: Never true  Transportation Needs: No Transportation Needs (05/30/2022)   PRAPARE - Administrator, Civil Service (Medical): No    Lack of Transportation (Non-Medical): No  Physical Activity: Insufficiently Active (05/30/2022)   Exercise Vital Sign    Days of Exercise per Week: 5 days    Minutes of Exercise per Session: 20 min  Stress: No Stress Concern Present (05/30/2022)   [employer] of Occupational Health - Occupational Stress Questionnaire    Feeling of Stress : Not at all  Social Connections: Unknown (05/30/2022)   Social Connection and Isolation Panel [NHANES]    Frequency of Communication with Friends and Family: Not on file    Frequency of Social Gatherings with Friends and Family: More than three times a week     Attends Religious Services: Not on Marketing executive or Organizations: Not on file    Attends Banker Meetings: Not on file    Marital Status: Not on file    Tobacco Counseling Counseling given: Not Answered   Clinical Intake:  Pre-visit preparation completed: Yes        Diabetes: No  How often do you need to have someone help you when you read instructions, pamphlets, or other written materials from your doctor or pharmacy?: 4 - Often    Interpreter Needed?: No      Activities of Daily Living    05/30/2022    2:07 PM  In your present state of health, do you have any difficulty performing the following activities:  Hearing? 0  Vision? 0  Difficulty concentrating or making decisions? 1  Walking or climbing stairs? 0  Dressing or bathing? 1  Comment Sister assist with bathing  Doing errands, shopping? 1  Comment Family Ship broker and eating ? Y  Comment Sister assist with meal prep. Self feeds.  Using the Toilet? N  In the past six months, have you accidently leaked urine? Y  Comment Managed with daily brief  Do you have problems with loss of bowel control? N  Managing your Medications? Y  Comment Sister assist  Managing your Finances? Y  Comment Sister assist  Housekeeping or managing your Housekeeping? Y  Comment Sister assist    Patient Care Team: Dale , MD as PCP - General (Internal Medicine) Glory Buff, RN as Oncology Nurse Navigator  Indicate any recent Medical Services you may have received from other than [facility] providers in the past year (date may be approximate).     Assessment:   This is a routine wellness examination for Johnnye.  I connected with  [name] on 05/30/22 by a  audio enabled telemedicine application and verified that I am speaking with the correct person using two identifiers. Information also received from Sister, HIPAA compliant.   Patient Location: Home  Provider  Location: Office/Clinic  I discussed the limitations of evaluation and management by telemedicine. The patient expressed understanding and agreed to proceed.   Hearing/Vision screen Hearing Screening - Comments:: Patient is able to hear conversational tones without difficulty.  No issues reported.   Vision Screening - Comments:: Wears readers  Dietary issues and exercise activities discussed: Current Exercise Habits: Structured exercise class, Type of exercise: stretching;walking (Physical therapy. Band stretches. Leg lifts.), Time (Minutes): 20, Frequency (Times/Week): 5, Weekly Exercise (Minutes/Week): 100, Intensity: Mild Healthy diet    Goals Addressed             This Visit's Progress    Increase physical activity       Walk more for exercise with sister       Depression Screen    05/30/2022    2:16 PM 11/11/2021   11:05 AM 07/04/2021    2:14 PM 01/03/2021    2:55 PM 07/05/2019    2:03 PM 06/30/2016    2:29 PM 03/22/2015   10:01 AM  PHQ 2/9 Scores  PHQ - 2 Score 0 0 0 0 0 4 1  PHQ- 9 Score      5     Fall Risk    05/30/2022    2:15 PM 05/06/2022   10:20 AM 11/11/2021   11:05 AM 07/04/2021    2:14 PM 07/04/2021    2:13 PM  Fall Risk   Falls in the past year? 1 1 1 1 1   Number falls in past yr: 0 1 0 1 1  Injury with Fall? 1 1 1 1 1   Comment Sought medical care. Followed by pcp.      Risk for fall due to :  History of fall(s) Impaired balance/gait Impaired balance/gait;Impaired mobility Impaired balance/gait;Impaired mobility;Mental status change  Follow up Falls evaluation completed;Falls prevention discussed  Falls evaluation completed  Falls evaluation completed    FALL RISK PREVENTION PERTAINING TO THE HOME: Home free of loose throw rugs in walkways, pet beds, electrical cords, etc? Yes  Adequate lighting in your home to reduce risk of falls? Yes   ASSISTIVE DEVICES UTILIZED TO PREVENT FALLS: Life alert? No  Use of a cane, walker or w/c? No  Grab bars in  the bathroom? Yes  Shower chair or bench in shower? Yes  Comfort chair height toilet? Yes   TIMED UP AND GO: Was the test performed? No .   Cognitive Function:  Patient is alert and oriented.       05/30/2022    2:20 PM  6CIT Screen  What Year? 0 points  What month? 0 points  What time? 0 points    Immunizations Immunization History  Administered Date(s) Administered   Fluad Quad(high Dose 65+) 12/14/2018, 01/15/2022   Influenza, High Dose Seasonal PF 12/22/2016, 11/20/2017   Influenza,inj,Quad PF,6+ Mos 12/13/2012   Influenza-Unspecified 12/13/2012, 01/03/2021   PFIZER(Purple Top)SARS-COV-2 Vaccination 03/31/2019, 04/21/2019   Pneumococcal Conjugate-13 01/06/2017   Pneumococcal Polysaccharide-23 01/15/2022   Tdap 02/27/2021   Screening Tests Health Maintenance  Topic Date Due   DEXA SCAN  Never done   COVID-19 Vaccine (3 - Pfizer risk series) 06/08/2022 (Originally 05/19/2019)   Zoster Vaccines- Shingrix (1 of 2) 08/22/2022 (Originally 06/07/1969)   Hepatitis C Screening  01/16/2023 (Originally 06/06/1968)   INFLUENZA VACCINE  09/18/2022  MAMMOGRAM  03/22/2023   DTaP/Tdap/Td (2 - Td or Tdap) 02/28/2031   Pneumonia Vaccine 67+ Years old  Completed   HPV VACCINES  Aged Out   COLONOSCOPY (Pts 45-21yrs Insurance coverage will need to be confirmed)  Discontinued    Health Maintenance Health Maintenance Due  Topic Date Due   DEXA SCAN  Never done   Lung Cancer Screening: (Low Dose CT Chest recommended if Age 78-80 years, 30 pack-year currently smoking OR have quit w/in 15years.) does not qualify.   Hepatitis C Screening: previously deferred.  Vision Screening: Recommended annual ophthalmology exams for early detection of glaucoma and other disorders of the eye.  Dental Screening: Recommended annual dental exams for proper oral hygiene  Community Resource Referral / Chronic Care Management: CRR required this visit?  No   CCM required this visit?  No      Plan:      I have personally reviewed and noted the following in the patient's chart:   Medical and social history Use of alcohol, tobacco or illicit drugs  Current medications and supplements including opioid prescriptions. Patient is not currently taking opioid prescriptions. Functional ability and status Nutritional status Physical activity Advanced directives List of other physicians Hospitalizations, surgeries, and ER visits in previous 12 months Vitals Screenings to include cognitive, depression, and falls Referrals and appointments  In addition, I have reviewed and discussed with patient certain preventive protocols, quality metrics, and best practice recommendations. A written personalized care plan for preventive services as well as general preventive health recommendations were provided to patient.     Cathey Endow, LPN   1/61/0960

## 2022-06-02 ENCOUNTER — Emergency Department: Payer: 59

## 2022-06-02 ENCOUNTER — Other Ambulatory Visit: Payer: Self-pay

## 2022-06-02 ENCOUNTER — Encounter: Payer: Self-pay | Admitting: Emergency Medicine

## 2022-06-02 ENCOUNTER — Emergency Department
Admission: EM | Admit: 2022-06-02 | Discharge: 2022-06-02 | Disposition: A | Discharge status: Home / Self Care | Payer: 59 | Attending: Emergency Medicine | Admitting: Emergency Medicine

## 2022-06-02 DIAGNOSIS — M25561 Pain in right knee: Secondary | ICD-10-CM | POA: Insufficient documentation

## 2022-06-02 DIAGNOSIS — W0110XA Fall on same level from slipping, tripping and stumbling with subsequent striking against unspecified object, initial encounter: Secondary | ICD-10-CM | POA: Insufficient documentation

## 2022-06-02 DIAGNOSIS — S0083XA Contusion of other part of head, initial encounter: Secondary | ICD-10-CM | POA: Insufficient documentation

## 2022-06-02 DIAGNOSIS — M79642 Pain in left hand: Secondary | ICD-10-CM | POA: Insufficient documentation

## 2022-06-02 DIAGNOSIS — Y9301 Activity, walking, marching and hiking: Secondary | ICD-10-CM | POA: Diagnosis not present

## 2022-06-02 DIAGNOSIS — S199XXA Unspecified injury of neck, initial encounter: Secondary | ICD-10-CM | POA: Diagnosis not present

## 2022-06-02 DIAGNOSIS — M25562 Pain in left knee: Secondary | ICD-10-CM | POA: Diagnosis not present

## 2022-06-02 DIAGNOSIS — S0003XA Contusion of scalp, initial encounter: Secondary | ICD-10-CM | POA: Diagnosis not present

## 2022-06-02 DIAGNOSIS — Z043 Encounter for examination and observation following other accident: Secondary | ICD-10-CM | POA: Diagnosis not present

## 2022-06-02 DIAGNOSIS — W19XXXA Unspecified fall, initial encounter: Secondary | ICD-10-CM

## 2022-06-02 LAB — COMPREHENSIVE METABOLIC PANEL
ALT: 23 U/L (ref 0–44)
AST: 33 U/L (ref 15–41)
Albumin: 3.7 g/dL (ref 3.5–5.0)
Alkaline Phosphatase: 72 U/L (ref 38–126)
Anion gap: 7 (ref 5–15)
BUN: 15 mg/dL (ref 8–23)
CO2: 27 mmol/L (ref 22–32)
Calcium: 8.3 mg/dL — ABNORMAL LOW (ref 8.9–10.3)
Chloride: 96 mmol/L — ABNORMAL LOW (ref 98–111)
Creatinine, Ser: 1.19 mg/dL — ABNORMAL HIGH (ref 0.44–1.00)
GFR, Estimated: 49 mL/min — ABNORMAL LOW (ref >60)
Glucose, Bld: 113 mg/dL — ABNORMAL HIGH (ref 70–99)
Potassium: 3.8 mmol/L (ref 3.5–5.1)
Sodium: 130 mmol/L — ABNORMAL LOW (ref 135–145)
Total Bilirubin: 0.6 mg/dL (ref 0.3–1.2)
Total Protein: 6.5 g/dL (ref 6.5–8.1)

## 2022-06-02 LAB — CBC WITH DIFFERENTIAL/PLATELET
Abs Immature Granulocytes: 0.04 10*3/uL (ref 0.00–0.07)
Basophils Absolute: 0 10*3/uL (ref 0.0–0.1)
Basophils Relative: 0 %
Eosinophils Absolute: 0.1 10*3/uL (ref 0.0–0.5)
Eosinophils Relative: 1 %
HCT: 39.9 % (ref 36.0–46.0)
Hemoglobin: 13 g/dL (ref 12.0–15.0)
Immature Granulocytes: 0 %
Lymphocytes Relative: 9 %
Lymphs Abs: 0.9 10*3/uL (ref 0.7–4.0)
MCH: 29.7 pg (ref 26.0–34.0)
MCHC: 32.6 g/dL (ref 30.0–36.0)
MCV: 91.1 fL (ref 80.0–100.0)
Monocytes Absolute: 0.9 10*3/uL (ref 0.1–1.0)
Monocytes Relative: 9 %
Neutro Abs: 8.4 10*3/uL — ABNORMAL HIGH (ref 1.7–7.7)
Neutrophils Relative %: 81 %
Platelets: 248 10*3/uL (ref 150–400)
RBC: 4.38 MIL/uL (ref 3.87–5.11)
RDW: 16.1 % — ABNORMAL HIGH (ref 11.5–15.5)
WBC: 10.4 10*3/uL (ref 4.0–10.5)
nRBC: 0 % (ref 0.0–0.2)

## 2022-06-02 LAB — URINALYSIS, ROUTINE W REFLEX MICROSCOPIC
Bilirubin Urine: NEGATIVE
Glucose, UA: NEGATIVE mg/dL
Hgb urine dipstick: NEGATIVE
Ketones, ur: NEGATIVE mg/dL
Leukocytes,Ua: NEGATIVE
Nitrite: NEGATIVE
Protein, ur: NEGATIVE mg/dL
Specific Gravity, Urine: 1.004 — ABNORMAL LOW (ref 1.005–1.030)
pH: 6 (ref 5.0–8.0)

## 2022-06-02 NOTE — ED Notes (Signed)
Patient ambulated to bathroom with standby assist. Steady gait. Ambulated back to room and positioned for comfort. No distress noted.

## 2022-06-02 NOTE — ED Triage Notes (Signed)
Patient to ED via POV after falling while walking through a door. Patient hit head with LOC- denies blood thinners. Bruising noted to right cheek. C/o bilateral leg pain.

## 2022-06-02 NOTE — ED Provider Notes (Signed)
[facility] Provider Note  Patient Contact: 6:34 PM (approximate)   History   Fall   HPI  Madison Burns is a 72 y.o. female presents to the emergency department after patient had a mechanical fall.  Patient is accompanied by caretaker who is uncertain how fall occurred.  Patient has facial bruising and slight swelling of lower lip.  She is also complaining of bilateral knee pain and left hand pain.  Caretaker reports that she did have a prior wrist fracture last year.      Physical Exam   Triage Vital Signs: ED Triage Vitals [06/02/22 1732]  Enc Vitals Group     BP 118/76     Pulse Rate 83     Resp 18     Temp 98.9 F (37.2 C)     Temp Source Oral     SpO2 95 %     Weight      Height      Head Circumference      Peak Flow      Pain Score 1     Pain Loc      Pain Edu?      Excl. in GC?     Most recent vital signs: Vitals:   06/02/22 1732 06/02/22 2036  BP: 118/76 (!) 162/95  Pulse: 83 88  Resp: 18 16  Temp: 98.9 F (37.2 C) 98.4 F (36.9 C)  SpO2: 95% 93%     General: Alert and in no acute distress. Eyes:  PERRL. EOMI. Head: No acute traumatic findings ENT:      Nose: No congestion/rhinnorhea.      Mouth/Throat: Mucous membranes are moist. Neck: No stridor. No cervical spine tenderness to palpation. Cardiovascular:  Good peripheral perfusion Respiratory: Normal respiratory effort without tachypnea or retractions. Lungs CTAB. Good air entry to the bases with no decreased or absent breath sounds. Gastrointestinal: Bowel sounds 4 quadrants. Soft and nontender to palpation. No guarding or rigidity. No palpable masses. No distention. No CVA tenderness. Musculoskeletal: Full range of motion to all extremities.  Neurologic:  No Guerrant focal neurologic deficits are appreciated.  Skin:   No rash noted    ED Results / Procedures / Treatments   Labs (all labs ordered are listed, but only abnormal results are displayed) Labs  Reviewed  URINALYSIS, ROUTINE W REFLEX MICROSCOPIC - Abnormal; Notable for the following components:      Result Value   Color, Urine YELLOW (*)    APPearance CLEAR (*)    Specific Gravity, Urine 1.004 (*)    All other components within normal limits  CBC WITH DIFFERENTIAL/PLATELET - Abnormal; Notable for the following components:   RDW 16.1 (*)    Neutro Abs 8.4 (*)    All other components within normal limits  COMPREHENSIVE METABOLIC PANEL - Abnormal; Notable for the following components:   Sodium 130 (*)    Chloride 96 (*)    Glucose, Bld 113 (*)    Creatinine, Ser 1.19 (*)    Calcium 8.3 (*)    GFR, Estimated 49 (*)    All other components within normal limits       RADIOLOGY  I personally viewed and evaluated these images as part of my medical decision making, as well as reviewing the written report by the radiologist.  ED Provider Interpretation: No acute abnormality on CTs of the head, max face or cervical spine.  X-rays of the bilateral knees unremarkable.  No acute bony abnormality of  the left hand.   PROCEDURES:  Critical Care performed: No  Procedures   MEDICATIONS ORDERED IN ED: Medications - No data to display   IMPRESSION / MDM / ASSESSMENT AND PLAN / ED COURSE  I reviewed the triage vital signs and the nursing notes.                              Assessment and plan: Fall:   72 year old female presents to the emergency department after a mechanical fall.  Patient was hypertensive at triage but vital signs were otherwise reassuring.  CBC and CMP mostly consistent with baseline labs.  No signs of UTI on urinalysis.  CTs of the head face and neck are unremarkable for acute fractures.  X-rays of the bilateral knees and left hand show no acute abnormalities.  Recommended Tylenol for discomfort at home.  Return precautions were given to return with new or worsening symptoms.   FINAL CLINICAL IMPRESSION(S) / ED DIAGNOSES   Final diagnoses:  Fall,  initial encounter     Rx / DC Orders   ED Discharge Orders     None        Note:  This document was prepared using Dragon voice recognition software and may include unintentional dictation errors.   Pia Mau [place], PA-C 06/02/22 2155    Merwyn Katos, MD 06/03/22 [place] Manners

## 2022-06-03 ENCOUNTER — Other Ambulatory Visit: Payer: Self-pay | Admitting: Neurology

## 2022-06-03 DIAGNOSIS — L89626 Pressure-induced deep tissue damage of left heel: Secondary | ICD-10-CM | POA: Diagnosis not present

## 2022-06-03 DIAGNOSIS — E869 Volume depletion, unspecified: Secondary | ICD-10-CM | POA: Diagnosis not present

## 2022-06-03 DIAGNOSIS — Z85828 Personal history of other malignant neoplasm of skin: Secondary | ICD-10-CM | POA: Diagnosis not present

## 2022-06-03 DIAGNOSIS — E039 Hypothyroidism, unspecified: Secondary | ICD-10-CM | POA: Diagnosis not present

## 2022-06-03 DIAGNOSIS — F73 Profound intellectual disabilities: Secondary | ICD-10-CM

## 2022-06-03 DIAGNOSIS — G4733 Obstructive sleep apnea (adult) (pediatric): Secondary | ICD-10-CM | POA: Diagnosis not present

## 2022-06-03 DIAGNOSIS — M7981 Nontraumatic hematoma of soft tissue: Secondary | ICD-10-CM | POA: Diagnosis not present

## 2022-06-03 DIAGNOSIS — I10 Essential (primary) hypertension: Secondary | ICD-10-CM | POA: Diagnosis not present

## 2022-06-03 DIAGNOSIS — K760 Fatty (change of) liver, not elsewhere classified: Secondary | ICD-10-CM | POA: Diagnosis not present

## 2022-06-03 DIAGNOSIS — E871 Hypo-osmolality and hyponatremia: Secondary | ICD-10-CM | POA: Diagnosis not present

## 2022-06-03 DIAGNOSIS — S62357D Nondisplaced fracture of shaft of fifth metacarpal bone, left hand, subsequent encounter for fracture with routine healing: Secondary | ICD-10-CM | POA: Diagnosis not present

## 2022-06-03 DIAGNOSIS — K219 Gastro-esophageal reflux disease without esophagitis: Secondary | ICD-10-CM | POA: Diagnosis not present

## 2022-06-03 DIAGNOSIS — Z85528 Personal history of other malignant neoplasm of kidney: Secondary | ICD-10-CM | POA: Diagnosis not present

## 2022-06-03 DIAGNOSIS — R911 Solitary pulmonary nodule: Secondary | ICD-10-CM | POA: Diagnosis not present

## 2022-06-03 DIAGNOSIS — E7800 Pure hypercholesterolemia, unspecified: Secondary | ICD-10-CM | POA: Diagnosis not present

## 2022-06-03 DIAGNOSIS — K589 Irritable bowel syndrome without diarrhea: Secondary | ICD-10-CM | POA: Diagnosis not present

## 2022-06-03 DIAGNOSIS — I480 Paroxysmal atrial fibrillation: Secondary | ICD-10-CM | POA: Diagnosis not present

## 2022-06-03 DIAGNOSIS — G7000 Myasthenia gravis without (acute) exacerbation: Secondary | ICD-10-CM | POA: Diagnosis not present

## 2022-06-03 DIAGNOSIS — G40309 Generalized idiopathic epilepsy and epileptic syndromes, not intractable, without status epilepticus: Secondary | ICD-10-CM

## 2022-06-03 DIAGNOSIS — Z85238 Personal history of other malignant neoplasm of thymus: Secondary | ICD-10-CM | POA: Diagnosis not present

## 2022-06-03 DIAGNOSIS — N3281 Overactive bladder: Secondary | ICD-10-CM | POA: Diagnosis not present

## 2022-06-04 DIAGNOSIS — L89626 Pressure-induced deep tissue damage of left heel: Secondary | ICD-10-CM | POA: Diagnosis not present

## 2022-06-04 DIAGNOSIS — E7800 Pure hypercholesterolemia, unspecified: Secondary | ICD-10-CM | POA: Diagnosis not present

## 2022-06-04 DIAGNOSIS — G4733 Obstructive sleep apnea (adult) (pediatric): Secondary | ICD-10-CM | POA: Diagnosis not present

## 2022-06-04 DIAGNOSIS — Z85528 Personal history of other malignant neoplasm of kidney: Secondary | ICD-10-CM | POA: Diagnosis not present

## 2022-06-04 DIAGNOSIS — G7000 Myasthenia gravis without (acute) exacerbation: Secondary | ICD-10-CM | POA: Diagnosis not present

## 2022-06-04 DIAGNOSIS — S62357D Nondisplaced fracture of shaft of fifth metacarpal bone, left hand, subsequent encounter for fracture with routine healing: Secondary | ICD-10-CM | POA: Diagnosis not present

## 2022-06-04 DIAGNOSIS — K760 Fatty (change of) liver, not elsewhere classified: Secondary | ICD-10-CM | POA: Diagnosis not present

## 2022-06-04 DIAGNOSIS — I10 Essential (primary) hypertension: Secondary | ICD-10-CM | POA: Diagnosis not present

## 2022-06-04 DIAGNOSIS — E871 Hypo-osmolality and hyponatremia: Secondary | ICD-10-CM | POA: Diagnosis not present

## 2022-06-04 DIAGNOSIS — I480 Paroxysmal atrial fibrillation: Secondary | ICD-10-CM | POA: Diagnosis not present

## 2022-06-04 DIAGNOSIS — Z85828 Personal history of other malignant neoplasm of skin: Secondary | ICD-10-CM | POA: Diagnosis not present

## 2022-06-04 DIAGNOSIS — F79 Unspecified intellectual disabilities: Secondary | ICD-10-CM

## 2022-06-04 DIAGNOSIS — E039 Hypothyroidism, unspecified: Secondary | ICD-10-CM | POA: Diagnosis not present

## 2022-06-04 DIAGNOSIS — E869 Volume depletion, unspecified: Secondary | ICD-10-CM | POA: Diagnosis not present

## 2022-06-04 DIAGNOSIS — M7981 Nontraumatic hematoma of soft tissue: Secondary | ICD-10-CM | POA: Diagnosis not present

## 2022-06-04 DIAGNOSIS — R911 Solitary pulmonary nodule: Secondary | ICD-10-CM | POA: Diagnosis not present

## 2022-06-04 DIAGNOSIS — N3281 Overactive bladder: Secondary | ICD-10-CM | POA: Diagnosis not present

## 2022-06-04 DIAGNOSIS — G40909 Epilepsy, unspecified, not intractable, without status epilepticus: Secondary | ICD-10-CM

## 2022-06-04 DIAGNOSIS — K589 Irritable bowel syndrome without diarrhea: Secondary | ICD-10-CM | POA: Diagnosis not present

## 2022-06-04 DIAGNOSIS — K219 Gastro-esophageal reflux disease without esophagitis: Secondary | ICD-10-CM | POA: Diagnosis not present

## 2022-06-04 DIAGNOSIS — Z85238 Personal history of other malignant neoplasm of thymus: Secondary | ICD-10-CM | POA: Diagnosis not present

## 2022-06-05 ENCOUNTER — Telehealth: Payer: Self-pay

## 2022-06-05 NOTE — Telephone Encounter (Signed)
        Patient  visited [place] on 4/15    Telephone encounter attempt :   1st A HIPAA compliant voice message was left requesting a return call.  Instructed patient to call back    Juliet Vasbinder [facility] Guide, [place] [phone] [address], [place], [place] [address] Phone: [phone] Email: [email or web address]       

## 2022-06-06 ENCOUNTER — Telehealth: Payer: Self-pay

## 2022-06-06 DIAGNOSIS — E871 Hypo-osmolality and hyponatremia: Secondary | ICD-10-CM | POA: Diagnosis not present

## 2022-06-06 DIAGNOSIS — N3281 Overactive bladder: Secondary | ICD-10-CM | POA: Diagnosis not present

## 2022-06-06 DIAGNOSIS — R911 Solitary pulmonary nodule: Secondary | ICD-10-CM | POA: Diagnosis not present

## 2022-06-06 DIAGNOSIS — E7800 Pure hypercholesterolemia, unspecified: Secondary | ICD-10-CM | POA: Diagnosis not present

## 2022-06-06 DIAGNOSIS — K589 Irritable bowel syndrome without diarrhea: Secondary | ICD-10-CM | POA: Diagnosis not present

## 2022-06-06 DIAGNOSIS — I480 Paroxysmal atrial fibrillation: Secondary | ICD-10-CM | POA: Diagnosis not present

## 2022-06-06 DIAGNOSIS — G4733 Obstructive sleep apnea (adult) (pediatric): Secondary | ICD-10-CM | POA: Diagnosis not present

## 2022-06-06 DIAGNOSIS — L89626 Pressure-induced deep tissue damage of left heel: Secondary | ICD-10-CM | POA: Diagnosis not present

## 2022-06-06 DIAGNOSIS — Z85528 Personal history of other malignant neoplasm of kidney: Secondary | ICD-10-CM | POA: Diagnosis not present

## 2022-06-06 DIAGNOSIS — E039 Hypothyroidism, unspecified: Secondary | ICD-10-CM | POA: Diagnosis not present

## 2022-06-06 DIAGNOSIS — Z85238 Personal history of other malignant neoplasm of thymus: Secondary | ICD-10-CM | POA: Diagnosis not present

## 2022-06-06 DIAGNOSIS — K219 Gastro-esophageal reflux disease without esophagitis: Secondary | ICD-10-CM | POA: Diagnosis not present

## 2022-06-06 DIAGNOSIS — I10 Essential (primary) hypertension: Secondary | ICD-10-CM | POA: Diagnosis not present

## 2022-06-06 DIAGNOSIS — K760 Fatty (change of) liver, not elsewhere classified: Secondary | ICD-10-CM | POA: Diagnosis not present

## 2022-06-06 DIAGNOSIS — S62357D Nondisplaced fracture of shaft of fifth metacarpal bone, left hand, subsequent encounter for fracture with routine healing: Secondary | ICD-10-CM | POA: Diagnosis not present

## 2022-06-06 DIAGNOSIS — M7981 Nontraumatic hematoma of soft tissue: Secondary | ICD-10-CM | POA: Diagnosis not present

## 2022-06-06 DIAGNOSIS — E869 Volume depletion, unspecified: Secondary | ICD-10-CM | POA: Diagnosis not present

## 2022-06-06 DIAGNOSIS — Z85828 Personal history of other malignant neoplasm of skin: Secondary | ICD-10-CM | POA: Diagnosis not present

## 2022-06-06 DIAGNOSIS — G7000 Myasthenia gravis without (acute) exacerbation: Secondary | ICD-10-CM | POA: Diagnosis not present

## 2022-06-06 NOTE — Telephone Encounter (Signed)
        Patient  visited [place] on 4/15     Telephone encounter attempt :  2nd  A HIPAA compliant voice message was left requesting a return call.  Instructed patient to call back.    Brody Kump [facility] Care Guide, [place] [phone] [address], [place], [place] [address] Phone: [phone] Email: [email or web address]       

## 2022-06-10 ENCOUNTER — Ambulatory Visit: Payer: Self-pay

## 2022-06-10 DIAGNOSIS — Z85528 Personal history of other malignant neoplasm of kidney: Secondary | ICD-10-CM | POA: Diagnosis not present

## 2022-06-10 DIAGNOSIS — E869 Volume depletion, unspecified: Secondary | ICD-10-CM | POA: Diagnosis not present

## 2022-06-10 DIAGNOSIS — I10 Essential (primary) hypertension: Secondary | ICD-10-CM | POA: Diagnosis not present

## 2022-06-10 DIAGNOSIS — S62357D Nondisplaced fracture of shaft of fifth metacarpal bone, left hand, subsequent encounter for fracture with routine healing: Secondary | ICD-10-CM | POA: Diagnosis not present

## 2022-06-10 DIAGNOSIS — E7800 Pure hypercholesterolemia, unspecified: Secondary | ICD-10-CM | POA: Diagnosis not present

## 2022-06-10 DIAGNOSIS — E871 Hypo-osmolality and hyponatremia: Secondary | ICD-10-CM | POA: Diagnosis not present

## 2022-06-10 DIAGNOSIS — N3281 Overactive bladder: Secondary | ICD-10-CM | POA: Diagnosis not present

## 2022-06-10 DIAGNOSIS — R911 Solitary pulmonary nodule: Secondary | ICD-10-CM | POA: Diagnosis not present

## 2022-06-10 DIAGNOSIS — Z85238 Personal history of other malignant neoplasm of thymus: Secondary | ICD-10-CM | POA: Diagnosis not present

## 2022-06-10 DIAGNOSIS — M7981 Nontraumatic hematoma of soft tissue: Secondary | ICD-10-CM | POA: Diagnosis not present

## 2022-06-10 DIAGNOSIS — G7000 Myasthenia gravis without (acute) exacerbation: Secondary | ICD-10-CM | POA: Diagnosis not present

## 2022-06-10 DIAGNOSIS — K760 Fatty (change of) liver, not elsewhere classified: Secondary | ICD-10-CM | POA: Diagnosis not present

## 2022-06-10 DIAGNOSIS — L89626 Pressure-induced deep tissue damage of left heel: Secondary | ICD-10-CM | POA: Diagnosis not present

## 2022-06-10 DIAGNOSIS — K219 Gastro-esophageal reflux disease without esophagitis: Secondary | ICD-10-CM | POA: Diagnosis not present

## 2022-06-10 DIAGNOSIS — Z85828 Personal history of other malignant neoplasm of skin: Secondary | ICD-10-CM | POA: Diagnosis not present

## 2022-06-10 DIAGNOSIS — I480 Paroxysmal atrial fibrillation: Secondary | ICD-10-CM | POA: Diagnosis not present

## 2022-06-10 DIAGNOSIS — G4733 Obstructive sleep apnea (adult) (pediatric): Secondary | ICD-10-CM | POA: Diagnosis not present

## 2022-06-10 DIAGNOSIS — K589 Irritable bowel syndrome without diarrhea: Secondary | ICD-10-CM | POA: Diagnosis not present

## 2022-06-10 DIAGNOSIS — E039 Hypothyroidism, unspecified: Secondary | ICD-10-CM | POA: Diagnosis not present

## 2022-06-10 NOTE — Patient Outreach (Signed)
  Care Coordination   06/10/2022 Name: Madison Burns MRN: [id number] DOB: 1950-10-29   Care Coordination Outreach Attempts:  An unsuccessful telephone outreach was attempted for a scheduled appointment today. HIPAA compliant voice message left with call back phone number and return call request.   Follow Up Plan:  Additional outreach attempts will be made to offer the patient care coordination information and services.   Encounter Outcome:  No Answer   Care Coordination Interventions:  No, not indicated    George Ina [facility] Care Coordination [phone] direct line

## 2022-06-11 ENCOUNTER — Telehealth: Payer: Self-pay | Admitting: Internal Medicine

## 2022-06-11 DIAGNOSIS — N3281 Overactive bladder: Secondary | ICD-10-CM | POA: Diagnosis not present

## 2022-06-11 DIAGNOSIS — I10 Essential (primary) hypertension: Secondary | ICD-10-CM | POA: Diagnosis not present

## 2022-06-11 DIAGNOSIS — K219 Gastro-esophageal reflux disease without esophagitis: Secondary | ICD-10-CM | POA: Diagnosis not present

## 2022-06-11 DIAGNOSIS — Z85238 Personal history of other malignant neoplasm of thymus: Secondary | ICD-10-CM | POA: Diagnosis not present

## 2022-06-11 DIAGNOSIS — L89626 Pressure-induced deep tissue damage of left heel: Secondary | ICD-10-CM | POA: Diagnosis not present

## 2022-06-11 DIAGNOSIS — K589 Irritable bowel syndrome without diarrhea: Secondary | ICD-10-CM | POA: Diagnosis not present

## 2022-06-11 DIAGNOSIS — Z85828 Personal history of other malignant neoplasm of skin: Secondary | ICD-10-CM | POA: Diagnosis not present

## 2022-06-11 DIAGNOSIS — S62357D Nondisplaced fracture of shaft of fifth metacarpal bone, left hand, subsequent encounter for fracture with routine healing: Secondary | ICD-10-CM | POA: Diagnosis not present

## 2022-06-11 DIAGNOSIS — E871 Hypo-osmolality and hyponatremia: Secondary | ICD-10-CM | POA: Diagnosis not present

## 2022-06-11 DIAGNOSIS — I480 Paroxysmal atrial fibrillation: Secondary | ICD-10-CM | POA: Diagnosis not present

## 2022-06-11 DIAGNOSIS — M7981 Nontraumatic hematoma of soft tissue: Secondary | ICD-10-CM | POA: Diagnosis not present

## 2022-06-11 DIAGNOSIS — E869 Volume depletion, unspecified: Secondary | ICD-10-CM | POA: Diagnosis not present

## 2022-06-11 DIAGNOSIS — G4733 Obstructive sleep apnea (adult) (pediatric): Secondary | ICD-10-CM | POA: Diagnosis not present

## 2022-06-11 DIAGNOSIS — G7000 Myasthenia gravis without (acute) exacerbation: Secondary | ICD-10-CM | POA: Diagnosis not present

## 2022-06-11 DIAGNOSIS — E039 Hypothyroidism, unspecified: Secondary | ICD-10-CM | POA: Diagnosis not present

## 2022-06-11 DIAGNOSIS — R911 Solitary pulmonary nodule: Secondary | ICD-10-CM | POA: Diagnosis not present

## 2022-06-11 DIAGNOSIS — Z85528 Personal history of other malignant neoplasm of kidney: Secondary | ICD-10-CM | POA: Diagnosis not present

## 2022-06-11 DIAGNOSIS — E7800 Pure hypercholesterolemia, unspecified: Secondary | ICD-10-CM | POA: Diagnosis not present

## 2022-06-11 DIAGNOSIS — K760 Fatty (change of) liver, not elsewhere classified: Secondary | ICD-10-CM | POA: Diagnosis not present

## 2022-06-11 NOTE — Telephone Encounter (Signed)
Agree with home health PT.  Let me know if they need something more.

## 2022-06-11 NOTE — Telephone Encounter (Signed)
FYI for you- patient was evaluated and doing ok. Now using walker to help with balance. She is also receiving home health.

## 2022-06-11 NOTE — Telephone Encounter (Signed)
Noted  

## 2022-06-11 NOTE — Telephone Encounter (Signed)
Patient's sister, Harriett Sine called. She wanted Dr Lorin Picket to know that patient fell again on 05/26/2022, Harriett Sine took patient to ED, no broken bones. Patient is doing fine and healing well. Family is making patient use the walker.

## 2022-06-12 DIAGNOSIS — K219 Gastro-esophageal reflux disease without esophagitis: Secondary | ICD-10-CM | POA: Diagnosis not present

## 2022-06-12 DIAGNOSIS — M7981 Nontraumatic hematoma of soft tissue: Secondary | ICD-10-CM | POA: Diagnosis not present

## 2022-06-12 DIAGNOSIS — K760 Fatty (change of) liver, not elsewhere classified: Secondary | ICD-10-CM | POA: Diagnosis not present

## 2022-06-12 DIAGNOSIS — I10 Essential (primary) hypertension: Secondary | ICD-10-CM | POA: Diagnosis not present

## 2022-06-12 DIAGNOSIS — E869 Volume depletion, unspecified: Secondary | ICD-10-CM | POA: Diagnosis not present

## 2022-06-12 DIAGNOSIS — R911 Solitary pulmonary nodule: Secondary | ICD-10-CM | POA: Diagnosis not present

## 2022-06-12 DIAGNOSIS — Z85828 Personal history of other malignant neoplasm of skin: Secondary | ICD-10-CM | POA: Diagnosis not present

## 2022-06-12 DIAGNOSIS — Z85528 Personal history of other malignant neoplasm of kidney: Secondary | ICD-10-CM | POA: Diagnosis not present

## 2022-06-12 DIAGNOSIS — E7800 Pure hypercholesterolemia, unspecified: Secondary | ICD-10-CM | POA: Diagnosis not present

## 2022-06-12 DIAGNOSIS — E871 Hypo-osmolality and hyponatremia: Secondary | ICD-10-CM | POA: Diagnosis not present

## 2022-06-12 DIAGNOSIS — G4733 Obstructive sleep apnea (adult) (pediatric): Secondary | ICD-10-CM | POA: Diagnosis not present

## 2022-06-12 DIAGNOSIS — K589 Irritable bowel syndrome without diarrhea: Secondary | ICD-10-CM | POA: Diagnosis not present

## 2022-06-12 DIAGNOSIS — G7000 Myasthenia gravis without (acute) exacerbation: Secondary | ICD-10-CM | POA: Diagnosis not present

## 2022-06-12 DIAGNOSIS — E039 Hypothyroidism, unspecified: Secondary | ICD-10-CM | POA: Diagnosis not present

## 2022-06-12 DIAGNOSIS — L89626 Pressure-induced deep tissue damage of left heel: Secondary | ICD-10-CM | POA: Diagnosis not present

## 2022-06-12 DIAGNOSIS — Z85238 Personal history of other malignant neoplasm of thymus: Secondary | ICD-10-CM | POA: Diagnosis not present

## 2022-06-12 DIAGNOSIS — I480 Paroxysmal atrial fibrillation: Secondary | ICD-10-CM | POA: Diagnosis not present

## 2022-06-12 DIAGNOSIS — S62357D Nondisplaced fracture of shaft of fifth metacarpal bone, left hand, subsequent encounter for fracture with routine healing: Secondary | ICD-10-CM | POA: Diagnosis not present

## 2022-06-12 DIAGNOSIS — N3281 Overactive bladder: Secondary | ICD-10-CM | POA: Diagnosis not present

## 2022-06-13 DIAGNOSIS — I10 Essential (primary) hypertension: Secondary | ICD-10-CM | POA: Diagnosis not present

## 2022-06-13 DIAGNOSIS — L89626 Pressure-induced deep tissue damage of left heel: Secondary | ICD-10-CM | POA: Diagnosis not present

## 2022-06-13 DIAGNOSIS — E869 Volume depletion, unspecified: Secondary | ICD-10-CM | POA: Diagnosis not present

## 2022-06-13 DIAGNOSIS — K589 Irritable bowel syndrome without diarrhea: Secondary | ICD-10-CM | POA: Diagnosis not present

## 2022-06-13 DIAGNOSIS — E871 Hypo-osmolality and hyponatremia: Secondary | ICD-10-CM | POA: Diagnosis not present

## 2022-06-13 DIAGNOSIS — E039 Hypothyroidism, unspecified: Secondary | ICD-10-CM | POA: Diagnosis not present

## 2022-06-13 DIAGNOSIS — R911 Solitary pulmonary nodule: Secondary | ICD-10-CM | POA: Diagnosis not present

## 2022-06-13 DIAGNOSIS — Z85238 Personal history of other malignant neoplasm of thymus: Secondary | ICD-10-CM | POA: Diagnosis not present

## 2022-06-13 DIAGNOSIS — K760 Fatty (change of) liver, not elsewhere classified: Secondary | ICD-10-CM | POA: Diagnosis not present

## 2022-06-13 DIAGNOSIS — Z85828 Personal history of other malignant neoplasm of skin: Secondary | ICD-10-CM | POA: Diagnosis not present

## 2022-06-13 DIAGNOSIS — G7000 Myasthenia gravis without (acute) exacerbation: Secondary | ICD-10-CM | POA: Diagnosis not present

## 2022-06-13 DIAGNOSIS — K219 Gastro-esophageal reflux disease without esophagitis: Secondary | ICD-10-CM | POA: Diagnosis not present

## 2022-06-13 DIAGNOSIS — E7800 Pure hypercholesterolemia, unspecified: Secondary | ICD-10-CM | POA: Diagnosis not present

## 2022-06-13 DIAGNOSIS — Z85528 Personal history of other malignant neoplasm of kidney: Secondary | ICD-10-CM | POA: Diagnosis not present

## 2022-06-13 DIAGNOSIS — M7981 Nontraumatic hematoma of soft tissue: Secondary | ICD-10-CM | POA: Diagnosis not present

## 2022-06-13 DIAGNOSIS — G4733 Obstructive sleep apnea (adult) (pediatric): Secondary | ICD-10-CM | POA: Diagnosis not present

## 2022-06-13 DIAGNOSIS — S62357D Nondisplaced fracture of shaft of fifth metacarpal bone, left hand, subsequent encounter for fracture with routine healing: Secondary | ICD-10-CM | POA: Diagnosis not present

## 2022-06-13 DIAGNOSIS — I480 Paroxysmal atrial fibrillation: Secondary | ICD-10-CM | POA: Diagnosis not present

## 2022-06-13 DIAGNOSIS — N3281 Overactive bladder: Secondary | ICD-10-CM | POA: Diagnosis not present

## 2022-06-16 DIAGNOSIS — I10 Essential (primary) hypertension: Secondary | ICD-10-CM | POA: Diagnosis not present

## 2022-06-16 DIAGNOSIS — G4733 Obstructive sleep apnea (adult) (pediatric): Secondary | ICD-10-CM | POA: Diagnosis not present

## 2022-06-16 DIAGNOSIS — I4891 Unspecified atrial fibrillation: Secondary | ICD-10-CM | POA: Diagnosis not present

## 2022-06-16 DIAGNOSIS — E7800 Pure hypercholesterolemia, unspecified: Secondary | ICD-10-CM | POA: Diagnosis not present

## 2022-06-17 ENCOUNTER — Telehealth: Payer: Self-pay | Admitting: *Deleted

## 2022-06-17 DIAGNOSIS — E039 Hypothyroidism, unspecified: Secondary | ICD-10-CM | POA: Diagnosis not present

## 2022-06-17 DIAGNOSIS — R911 Solitary pulmonary nodule: Secondary | ICD-10-CM | POA: Diagnosis not present

## 2022-06-17 DIAGNOSIS — Z85828 Personal history of other malignant neoplasm of skin: Secondary | ICD-10-CM | POA: Diagnosis not present

## 2022-06-17 DIAGNOSIS — S62357D Nondisplaced fracture of shaft of fifth metacarpal bone, left hand, subsequent encounter for fracture with routine healing: Secondary | ICD-10-CM | POA: Diagnosis not present

## 2022-06-17 DIAGNOSIS — K589 Irritable bowel syndrome without diarrhea: Secondary | ICD-10-CM | POA: Diagnosis not present

## 2022-06-17 DIAGNOSIS — E871 Hypo-osmolality and hyponatremia: Secondary | ICD-10-CM | POA: Diagnosis not present

## 2022-06-17 DIAGNOSIS — G7000 Myasthenia gravis without (acute) exacerbation: Secondary | ICD-10-CM | POA: Diagnosis not present

## 2022-06-17 DIAGNOSIS — I480 Paroxysmal atrial fibrillation: Secondary | ICD-10-CM | POA: Diagnosis not present

## 2022-06-17 DIAGNOSIS — Z85238 Personal history of other malignant neoplasm of thymus: Secondary | ICD-10-CM | POA: Diagnosis not present

## 2022-06-17 DIAGNOSIS — K219 Gastro-esophageal reflux disease without esophagitis: Secondary | ICD-10-CM | POA: Diagnosis not present

## 2022-06-17 DIAGNOSIS — G4733 Obstructive sleep apnea (adult) (pediatric): Secondary | ICD-10-CM | POA: Diagnosis not present

## 2022-06-17 DIAGNOSIS — N3281 Overactive bladder: Secondary | ICD-10-CM | POA: Diagnosis not present

## 2022-06-17 DIAGNOSIS — M7981 Nontraumatic hematoma of soft tissue: Secondary | ICD-10-CM | POA: Diagnosis not present

## 2022-06-17 DIAGNOSIS — K760 Fatty (change of) liver, not elsewhere classified: Secondary | ICD-10-CM | POA: Diagnosis not present

## 2022-06-17 DIAGNOSIS — Z85528 Personal history of other malignant neoplasm of kidney: Secondary | ICD-10-CM | POA: Diagnosis not present

## 2022-06-17 DIAGNOSIS — I10 Essential (primary) hypertension: Secondary | ICD-10-CM | POA: Diagnosis not present

## 2022-06-17 DIAGNOSIS — L89626 Pressure-induced deep tissue damage of left heel: Secondary | ICD-10-CM | POA: Diagnosis not present

## 2022-06-17 DIAGNOSIS — E869 Volume depletion, unspecified: Secondary | ICD-10-CM | POA: Diagnosis not present

## 2022-06-17 DIAGNOSIS — E7800 Pure hypercholesterolemia, unspecified: Secondary | ICD-10-CM | POA: Diagnosis not present

## 2022-06-17 NOTE — Progress Notes (Signed)
  Care Coordination Note  06/17/2022 Name: [name] MRN: [id number] DOB: 1950-03-15  Madison Burns is a 72 y.o. year old female who is a primary care patient of Dale Van Dyne, MD and is actively engaged with the care management team. I reached out to [name] by phone today to assist with re-scheduling a follow up visit with the RN Case Manager  Follow up plan: Unsuccessful telephone outreach attempt made. A HIPAA compliant phone message was left for the patient providing contact information and requesting a return call.   Burman Nieves, CCMA Care Coordination Care Guide Direct Dial: [phone]

## 2022-06-19 ENCOUNTER — Encounter: Payer: Self-pay | Admitting: Emergency Medicine

## 2022-06-19 ENCOUNTER — Other Ambulatory Visit: Payer: Self-pay

## 2022-06-19 ENCOUNTER — Emergency Department
Admission: EM | Admit: 2022-06-19 | Discharge: 2022-06-19 | Disposition: A | Discharge status: Home / Self Care | Payer: 59 | Attending: Emergency Medicine | Admitting: Emergency Medicine

## 2022-06-19 ENCOUNTER — Emergency Department: Payer: 59

## 2022-06-19 DIAGNOSIS — M7981 Nontraumatic hematoma of soft tissue: Secondary | ICD-10-CM | POA: Diagnosis not present

## 2022-06-19 DIAGNOSIS — L89626 Pressure-induced deep tissue damage of left heel: Secondary | ICD-10-CM | POA: Diagnosis not present

## 2022-06-19 DIAGNOSIS — S62357D Nondisplaced fracture of shaft of fifth metacarpal bone, left hand, subsequent encounter for fracture with routine healing: Secondary | ICD-10-CM | POA: Diagnosis not present

## 2022-06-19 DIAGNOSIS — N3281 Overactive bladder: Secondary | ICD-10-CM | POA: Diagnosis not present

## 2022-06-19 DIAGNOSIS — E869 Volume depletion, unspecified: Secondary | ICD-10-CM | POA: Diagnosis not present

## 2022-06-19 DIAGNOSIS — Y9301 Activity, walking, marching and hiking: Secondary | ICD-10-CM | POA: Diagnosis not present

## 2022-06-19 DIAGNOSIS — I10 Essential (primary) hypertension: Secondary | ICD-10-CM | POA: Diagnosis not present

## 2022-06-19 DIAGNOSIS — S92102A Unspecified fracture of left talus, initial encounter for closed fracture: Secondary | ICD-10-CM | POA: Insufficient documentation

## 2022-06-19 DIAGNOSIS — S92152A Displaced avulsion fracture (chip fracture) of left talus, initial encounter for closed fracture: Secondary | ICD-10-CM

## 2022-06-19 DIAGNOSIS — E039 Hypothyroidism, unspecified: Secondary | ICD-10-CM | POA: Diagnosis not present

## 2022-06-19 DIAGNOSIS — W19XXXA Unspecified fall, initial encounter: Secondary | ICD-10-CM | POA: Diagnosis not present

## 2022-06-19 DIAGNOSIS — G7000 Myasthenia gravis without (acute) exacerbation: Secondary | ICD-10-CM | POA: Diagnosis not present

## 2022-06-19 DIAGNOSIS — E871 Hypo-osmolality and hyponatremia: Secondary | ICD-10-CM | POA: Diagnosis not present

## 2022-06-19 DIAGNOSIS — K760 Fatty (change of) liver, not elsewhere classified: Secondary | ICD-10-CM | POA: Diagnosis not present

## 2022-06-19 DIAGNOSIS — I480 Paroxysmal atrial fibrillation: Secondary | ICD-10-CM | POA: Diagnosis not present

## 2022-06-19 DIAGNOSIS — G4733 Obstructive sleep apnea (adult) (pediatric): Secondary | ICD-10-CM | POA: Diagnosis not present

## 2022-06-19 DIAGNOSIS — R911 Solitary pulmonary nodule: Secondary | ICD-10-CM | POA: Diagnosis not present

## 2022-06-19 DIAGNOSIS — Z85238 Personal history of other malignant neoplasm of thymus: Secondary | ICD-10-CM | POA: Diagnosis not present

## 2022-06-19 DIAGNOSIS — M25572 Pain in left ankle and joints of left foot: Secondary | ICD-10-CM | POA: Diagnosis present

## 2022-06-19 DIAGNOSIS — E7800 Pure hypercholesterolemia, unspecified: Secondary | ICD-10-CM | POA: Diagnosis not present

## 2022-06-19 DIAGNOSIS — K589 Irritable bowel syndrome without diarrhea: Secondary | ICD-10-CM | POA: Diagnosis not present

## 2022-06-19 DIAGNOSIS — R600 Localized edema: Secondary | ICD-10-CM | POA: Diagnosis not present

## 2022-06-19 DIAGNOSIS — K219 Gastro-esophageal reflux disease without esophagitis: Secondary | ICD-10-CM | POA: Diagnosis not present

## 2022-06-19 DIAGNOSIS — Z85828 Personal history of other malignant neoplasm of skin: Secondary | ICD-10-CM | POA: Diagnosis not present

## 2022-06-19 DIAGNOSIS — Z85528 Personal history of other malignant neoplasm of kidney: Secondary | ICD-10-CM | POA: Diagnosis not present

## 2022-06-19 NOTE — Discharge Instructions (Signed)
Call make an a follow-up appoint with Dr. Alberteen Spindle who is on-call for podiatry at [facility].  His contact information and address are listed on your discharge papers.  Wear the cam walker boot for support and protection.  When you are sitting elevate and ice to help reduce swelling.  Watch the blister for any signs of infection and allow it to pop on its own.  After that you may need to cover this area until it is healed.  Reducing the swelling will also help with this.  Continue with Tylenol every 6-8 hours as needed for pain.

## 2022-06-19 NOTE — ED Provider Notes (Signed)
[facility] Provider Note    Event Date/Time   First MD Initiated Contact with Patient 06/19/22 1018     (approximate)   History   Ankle Pain   HPI  Madison Burns is a 72 y.o. female presents to the ED with complaint of left ankle pain.  Patient generally walks with a walker but apparently 6 days ago walked outside to get the mail and fell coming into the house.  Her sister observed the fall and states that there was no head injury or loss of consciousness.  She has continued to have some swelling in her left ankle.  Home health came out to bathe her and noticed a blister to her left ankle area.  Patient has a history of hypertension, myasthenia gravis, seizure disorder, history of left renal mass, atrial fib, and idiopathic profound intellectual disability.     Physical Exam   Triage Vital Signs: ED Triage Vitals  Enc Vitals Group     BP 06/19/22 1017 (!) 149/100     Pulse Rate 06/19/22 1017 81     Resp 06/19/22 1017 18     Temp 06/19/22 1017 98.2 F (36.8 C)     Temp Source 06/19/22 1017 Oral     SpO2 06/19/22 1017 100 %     Weight --      Height --      Head Circumference --      Peak Flow --      Pain Score 06/19/22 1016 7     Pain Loc --      Pain Edu? --      Excl. in GC? --     Most recent vital signs: Vitals:   06/19/22 1017  BP: (!) 149/100  Pulse: 81  Resp: 18  Temp: 98.2 F (36.8 C)  SpO2: 100%     General: Awake, no distress.  Alert, talkative, answers simple questions with short answers.  Patient is at her baseline per sister. CV:  Good peripheral perfusion.  Resp:  Normal effort.  Abd:  No distention.  Other:  Left ankle exam shows soft tissue edema with a blister to the medial aspect that is intact.  No erythema or warmth in the area.  Patient is able move digits without any difficulty.  Pulses are intact.  Patient does have some generalized tenderness but endorses more discomfort with flexion of her foot and the  ankle area.   ED Results / Procedures / Treatments   Labs (all labs ordered are listed, but only abnormal results are displayed) Labs Reviewed - No data to display    RADIOLOGY Left foot x-ray images were reviewed and interpreted by myself independent of the radiologist.  No obvious fracture was noted by myself however radiology reports a very small "fleck" avulsion fracture off the lateral talus.    PROCEDURES:  Critical Care performed:   Procedures   MEDICATIONS ORDERED IN ED: Medications - No data to display   IMPRESSION / MDM / ASSESSMENT AND PLAN / ED COURSE  I reviewed the triage vital signs and the nursing notes.   Differential diagnosis includes, but is not limited to, left ankle sprain, dislocation, fracture, contusion, cellulitis.  72 year old female presents to the ED with complaint of left ankle pain after a fall that occurred 6 days ago.  X-rays show a very small area that could represent a avulsion fracture from the left talus lateral aspect.  Patient was placed in a cam walker and  will continue using her walker.  Over-the-counter medication as needed for pain.  She is to follow-up with Dr. Alberteen Spindle who is on-call for podiatry if any continued problems, otherwise she can follow-up with her PCP      Patient's presentation is most consistent with acute complicated illness / injury requiring diagnostic workup.  FINAL CLINICAL IMPRESSION(S) / ED DIAGNOSES   Final diagnoses:  Avulsion fracture of left talus, closed, initial encounter     Rx / DC Orders   ED Discharge Orders     None        Note:  This document was prepared using Dragon voice recognition software and may include unintentional dictation errors.   Tommi Rumps, PA-C 06/19/22 1321    Georga Hacking, MD 06/19/22 [phone]

## 2022-06-19 NOTE — ED Triage Notes (Signed)
Patient to ED for left ankle- states there is a blood blister that they found this AM. Had a fall last Friday.

## 2022-06-20 DIAGNOSIS — E7800 Pure hypercholesterolemia, unspecified: Secondary | ICD-10-CM | POA: Diagnosis not present

## 2022-06-20 DIAGNOSIS — E871 Hypo-osmolality and hyponatremia: Secondary | ICD-10-CM | POA: Diagnosis not present

## 2022-06-20 DIAGNOSIS — G7000 Myasthenia gravis without (acute) exacerbation: Secondary | ICD-10-CM | POA: Diagnosis not present

## 2022-06-20 DIAGNOSIS — Z85528 Personal history of other malignant neoplasm of kidney: Secondary | ICD-10-CM | POA: Diagnosis not present

## 2022-06-20 DIAGNOSIS — M7981 Nontraumatic hematoma of soft tissue: Secondary | ICD-10-CM | POA: Diagnosis not present

## 2022-06-20 DIAGNOSIS — K219 Gastro-esophageal reflux disease without esophagitis: Secondary | ICD-10-CM | POA: Diagnosis not present

## 2022-06-20 DIAGNOSIS — K589 Irritable bowel syndrome without diarrhea: Secondary | ICD-10-CM | POA: Diagnosis not present

## 2022-06-20 DIAGNOSIS — L89626 Pressure-induced deep tissue damage of left heel: Secondary | ICD-10-CM | POA: Diagnosis not present

## 2022-06-20 DIAGNOSIS — Z85238 Personal history of other malignant neoplasm of thymus: Secondary | ICD-10-CM | POA: Diagnosis not present

## 2022-06-20 DIAGNOSIS — E869 Volume depletion, unspecified: Secondary | ICD-10-CM | POA: Diagnosis not present

## 2022-06-20 DIAGNOSIS — I10 Essential (primary) hypertension: Secondary | ICD-10-CM | POA: Diagnosis not present

## 2022-06-20 DIAGNOSIS — G4733 Obstructive sleep apnea (adult) (pediatric): Secondary | ICD-10-CM | POA: Diagnosis not present

## 2022-06-20 DIAGNOSIS — S62357D Nondisplaced fracture of shaft of fifth metacarpal bone, left hand, subsequent encounter for fracture with routine healing: Secondary | ICD-10-CM | POA: Diagnosis not present

## 2022-06-20 DIAGNOSIS — E039 Hypothyroidism, unspecified: Secondary | ICD-10-CM | POA: Diagnosis not present

## 2022-06-20 DIAGNOSIS — R911 Solitary pulmonary nodule: Secondary | ICD-10-CM | POA: Diagnosis not present

## 2022-06-20 DIAGNOSIS — I480 Paroxysmal atrial fibrillation: Secondary | ICD-10-CM | POA: Diagnosis not present

## 2022-06-20 DIAGNOSIS — Z85828 Personal history of other malignant neoplasm of skin: Secondary | ICD-10-CM | POA: Diagnosis not present

## 2022-06-20 DIAGNOSIS — K760 Fatty (change of) liver, not elsewhere classified: Secondary | ICD-10-CM | POA: Diagnosis not present

## 2022-06-20 DIAGNOSIS — N3281 Overactive bladder: Secondary | ICD-10-CM | POA: Diagnosis not present

## 2022-06-23 NOTE — Progress Notes (Unsigned)
  Care Coordination Note  06/23/2022 Name: Madison Burns MRN: [id number] DOB: [birth year]  Madison Burns is a 72 y.o. year old female who is a primary care patient of Dale [place], MD and is actively engaged with the care management team. I reached out to [name] by phone today to assist with re-scheduling a follow up visit with the RN Case Manager  Follow up plan: 2nd Unsuccessful telephone outreach attempt made. A HIPAA compliant phone message was left for the patient providing contact information and requesting a return call.   Lowella Dandy, CCMA Care Coordination Care Guide Direct Dial: [phone]

## 2022-06-24 NOTE — Progress Notes (Signed)
  Care Coordination Note  06/24/2022 Name: KEANNAH NORTHROP MRN: [id number] DOB: 23-Jul-1950  DALLIS GORELIK is a 72 y.o. year old female who is a primary care patient of Dale Rennert, MD and is actively engaged with the care management team. I reached out to [name] by phone today to assist with re-scheduling a follow up visit with the RN Case Manager  Follow up plan: Telephone appointment with care management team member scheduled for: 06/25/2022  Burman Nieves, [facility] Care Coordination Care Guide Direct Dial: [phone]

## 2022-06-25 ENCOUNTER — Ambulatory Visit (INDEPENDENT_AMBULATORY_CARE_PROVIDER_SITE_OTHER): Payer: 59 | Admitting: Podiatry

## 2022-06-25 ENCOUNTER — Ambulatory Visit: Payer: Self-pay

## 2022-06-25 DIAGNOSIS — E7800 Pure hypercholesterolemia, unspecified: Secondary | ICD-10-CM | POA: Diagnosis not present

## 2022-06-25 DIAGNOSIS — E869 Volume depletion, unspecified: Secondary | ICD-10-CM | POA: Diagnosis not present

## 2022-06-25 DIAGNOSIS — G4733 Obstructive sleep apnea (adult) (pediatric): Secondary | ICD-10-CM | POA: Diagnosis not present

## 2022-06-25 DIAGNOSIS — N3281 Overactive bladder: Secondary | ICD-10-CM | POA: Diagnosis not present

## 2022-06-25 DIAGNOSIS — I10 Essential (primary) hypertension: Secondary | ICD-10-CM | POA: Diagnosis not present

## 2022-06-25 DIAGNOSIS — E039 Hypothyroidism, unspecified: Secondary | ICD-10-CM | POA: Diagnosis not present

## 2022-06-25 DIAGNOSIS — S62357D Nondisplaced fracture of shaft of fifth metacarpal bone, left hand, subsequent encounter for fracture with routine healing: Secondary | ICD-10-CM | POA: Diagnosis not present

## 2022-06-25 DIAGNOSIS — Z85238 Personal history of other malignant neoplasm of thymus: Secondary | ICD-10-CM | POA: Diagnosis not present

## 2022-06-25 DIAGNOSIS — S92155A Nondisplaced avulsion fracture (chip fracture) of left talus, initial encounter for closed fracture: Secondary | ICD-10-CM | POA: Diagnosis not present

## 2022-06-25 DIAGNOSIS — L89626 Pressure-induced deep tissue damage of left heel: Secondary | ICD-10-CM | POA: Diagnosis not present

## 2022-06-25 DIAGNOSIS — G7000 Myasthenia gravis without (acute) exacerbation: Secondary | ICD-10-CM | POA: Diagnosis not present

## 2022-06-25 DIAGNOSIS — Z85528 Personal history of other malignant neoplasm of kidney: Secondary | ICD-10-CM | POA: Diagnosis not present

## 2022-06-25 DIAGNOSIS — E871 Hypo-osmolality and hyponatremia: Secondary | ICD-10-CM | POA: Diagnosis not present

## 2022-06-25 DIAGNOSIS — K760 Fatty (change of) liver, not elsewhere classified: Secondary | ICD-10-CM | POA: Diagnosis not present

## 2022-06-25 DIAGNOSIS — K219 Gastro-esophageal reflux disease without esophagitis: Secondary | ICD-10-CM | POA: Diagnosis not present

## 2022-06-25 DIAGNOSIS — Z85828 Personal history of other malignant neoplasm of skin: Secondary | ICD-10-CM | POA: Diagnosis not present

## 2022-06-25 DIAGNOSIS — M7981 Nontraumatic hematoma of soft tissue: Secondary | ICD-10-CM | POA: Diagnosis not present

## 2022-06-25 DIAGNOSIS — I480 Paroxysmal atrial fibrillation: Secondary | ICD-10-CM | POA: Diagnosis not present

## 2022-06-25 DIAGNOSIS — R911 Solitary pulmonary nodule: Secondary | ICD-10-CM | POA: Diagnosis not present

## 2022-06-25 DIAGNOSIS — K589 Irritable bowel syndrome without diarrhea: Secondary | ICD-10-CM | POA: Diagnosis not present

## 2022-06-25 NOTE — Patient Outreach (Signed)
  Care Coordination   Follow Up Visit Note   06/25/2022 Name: Madison Burns MRN: [id number] DOB: 1950/12/27  Madison Burns is a 72 y.o. year old female who sees Madison [place], MD for primary care. I spoke with sister, Madison Burns by phone today.  What matters to the patients health and wellness today?  Sister states patient sustained fracture to left  ankle on 06/19/22. She was seen at the ED.   She states patient went to the mail box and was coming back inside the front door and tripped and fell. She states patient did not wait for her to assist with maneuvering the walker inside of door frame area.  Sister states patient has special needs. Sister states patient is currently in a boot. She reports patient has a follow up visit with the foot doctor today.  She denies patient complaining of pain.  Sister states she makes sure patient is elevating foot when sitting. She states [facility] home [facility] continue to provider PT 1-2 times per week.     Goals Addressed             This Visit's Progress    Post hospital follow up / management of health conditions       Interventions Today    Flowsheet Row Most Recent Value  Chronic Disease   Chronic disease during today's visit Other  [Falls, Mysthenia gravis]  General Interventions   General Interventions Discussed/Reviewed General Interventions Reviewed, Doctor Visits  [evaluation of current treatment plan for falls, Myasthenia gravis and patients adherence to plan as established by provider. Assessed for pain level.]  Doctor Visits Discussed/Reviewed Doctor Visits Reviewed  Madison Burns upcoming/ scheduled provider visits. Confirmed patient has follow up  provider visit post ankle fracture.]  Education Interventions   Education Provided Provided Education  Provided Verbal Education On Other  [Discussed signs/ symptoms of infection. Advised to contact provider for infection symptoms. Advised to elevate leg when sitting. Stressed importance of  patient walking with walker at all times.]  Pharmacy Interventions   Pharmacy Dicussed/Reviewed Pharmacy Topics Reviewed  [medications reviewed and compliance discussed.]  Safety Interventions   Safety Discussed/Reviewed Fall Risk  [assessed for additional falls.  Confirmed patient receiving  PT services with [facility] home [facility].]              SDOH assessments and interventions completed:  No     Care Coordination Interventions:  Yes, provided   Follow up plan: Follow up call scheduled for 08/05/22    Encounter Outcome:  Pt. Visit Completed   Madison Ina RN,BSN,CCM [facility] Care Coordination [phone] direct line

## 2022-06-25 NOTE — Patient Instructions (Signed)
Visit Information  Thank you for taking time to visit with me today. Please don't hesitate to contact me if I can be of assistance to you.   Following are the goals we discussed today:  Notify provider for signs/ symptoms of infection:  increase swelling, draining from wound site, redness, odor, and/ or running a fever Keep leg elevated when sitting to help decrease swelling and pain Keep follow up appointments as recommended.  Notify provider for any new or ongoing symptoms  / concerns.   Our next appointment is by telephone on 08/05/22 at 11 am  Please call the care guide team at [phone] if you need to cancel or reschedule your appointment.   If you are experiencing a Mental Health or Behavioral Health Crisis or need someone to talk to, please call the Suicide and Crisis Lifeline: 988 call 1-800-273-TALK (toll free, 24 hour hotline)  Patient verbalizes understanding of instructions and care plan provided today and agrees to view in MyChart. Active MyChart status and patient understanding of how to access instructions and care plan via MyChart confirmed with patient.     George Ina RN,BSN,CCM [facility] Care Coordination [phone] direct line

## 2022-06-26 DIAGNOSIS — K219 Gastro-esophageal reflux disease without esophagitis: Secondary | ICD-10-CM | POA: Diagnosis not present

## 2022-06-26 DIAGNOSIS — E039 Hypothyroidism, unspecified: Secondary | ICD-10-CM | POA: Diagnosis not present

## 2022-06-26 DIAGNOSIS — Z85238 Personal history of other malignant neoplasm of thymus: Secondary | ICD-10-CM | POA: Diagnosis not present

## 2022-06-26 DIAGNOSIS — E871 Hypo-osmolality and hyponatremia: Secondary | ICD-10-CM | POA: Diagnosis not present

## 2022-06-26 DIAGNOSIS — R911 Solitary pulmonary nodule: Secondary | ICD-10-CM | POA: Diagnosis not present

## 2022-06-26 DIAGNOSIS — Z85828 Personal history of other malignant neoplasm of skin: Secondary | ICD-10-CM | POA: Diagnosis not present

## 2022-06-26 DIAGNOSIS — G7000 Myasthenia gravis without (acute) exacerbation: Secondary | ICD-10-CM | POA: Diagnosis not present

## 2022-06-26 DIAGNOSIS — L89626 Pressure-induced deep tissue damage of left heel: Secondary | ICD-10-CM | POA: Diagnosis not present

## 2022-06-26 DIAGNOSIS — S62357D Nondisplaced fracture of shaft of fifth metacarpal bone, left hand, subsequent encounter for fracture with routine healing: Secondary | ICD-10-CM | POA: Diagnosis not present

## 2022-06-26 DIAGNOSIS — I10 Essential (primary) hypertension: Secondary | ICD-10-CM | POA: Diagnosis not present

## 2022-06-26 DIAGNOSIS — M545 Low back pain, unspecified: Secondary | ICD-10-CM | POA: Diagnosis not present

## 2022-06-26 DIAGNOSIS — Z85528 Personal history of other malignant neoplasm of kidney: Secondary | ICD-10-CM | POA: Diagnosis not present

## 2022-06-26 DIAGNOSIS — K589 Irritable bowel syndrome without diarrhea: Secondary | ICD-10-CM | POA: Diagnosis not present

## 2022-06-26 DIAGNOSIS — G4733 Obstructive sleep apnea (adult) (pediatric): Secondary | ICD-10-CM | POA: Diagnosis not present

## 2022-06-26 DIAGNOSIS — I480 Paroxysmal atrial fibrillation: Secondary | ICD-10-CM | POA: Diagnosis not present

## 2022-06-26 DIAGNOSIS — E7800 Pure hypercholesterolemia, unspecified: Secondary | ICD-10-CM | POA: Diagnosis not present

## 2022-06-26 DIAGNOSIS — M533 Sacrococcygeal disorders, not elsewhere classified: Secondary | ICD-10-CM | POA: Diagnosis not present

## 2022-06-26 DIAGNOSIS — E869 Volume depletion, unspecified: Secondary | ICD-10-CM | POA: Diagnosis not present

## 2022-06-26 DIAGNOSIS — K760 Fatty (change of) liver, not elsewhere classified: Secondary | ICD-10-CM | POA: Diagnosis not present

## 2022-06-26 DIAGNOSIS — N3281 Overactive bladder: Secondary | ICD-10-CM | POA: Diagnosis not present

## 2022-06-26 DIAGNOSIS — S32010A Wedge compression fracture of first lumbar vertebra, initial encounter for closed fracture: Secondary | ICD-10-CM | POA: Diagnosis not present

## 2022-06-26 DIAGNOSIS — M7981 Nontraumatic hematoma of soft tissue: Secondary | ICD-10-CM | POA: Diagnosis not present

## 2022-06-26 DIAGNOSIS — R296 Repeated falls: Secondary | ICD-10-CM | POA: Diagnosis not present

## 2022-06-30 ENCOUNTER — Encounter: Payer: Self-pay | Admitting: Podiatry

## 2022-06-30 DIAGNOSIS — H903 Sensorineural hearing loss, bilateral: Secondary | ICD-10-CM | POA: Diagnosis not present

## 2022-06-30 NOTE — Progress Notes (Signed)
  Subjective:  Patient ID: Madison Burns, female    DOB: 03/17/1950,  MRN: [id number]  Chief Complaint  Patient presents with   Foot Ulcer    3 week follow up left heel   Fracture    She fell 2 weeks ago and then didn't tell anyone that her ankle was hurting. She walked on it for a week before anyone knew about it - ED did xrays and put her in a boot    72 y.o. female presents with the above complaint. History confirmed with patient.  Nails are thickened and elongated.  She is here with her sister.  Objective:  Physical Exam: warm, good capillary refill, normal DP and PT pulses, and deep tissue injury has healed, left ankle pain tenderness and swelling Left Foot: dystrophic yellowed discolored nail plates with subungual debris Right Foot: dystrophic yellowed discolored nail plates with subungual debris  Radiographs from ER 06/19/2022 reviewed there is an age-indeterminate dorsal talar neck radiodense body, lateral talar process small avulsion Assessment:   1. Pressure injury of deep tissue of left heel   2. Closed nondisplaced avulsion fracture of left talus, initial encounter      Plan:  Patient was evaluated and treated and all questions answered.  Pressure injury has healed uneventfully.  Continue to offload pressure.  She may be WBAT in the cam boot.  Discussed that she has an avulsion fracture of the talus, do not recommend any operative treatment.  I will see her back in 1 month for follow-up and further treatment. No follow-ups on file.

## 2022-07-02 ENCOUNTER — Other Ambulatory Visit: Payer: Self-pay | Admitting: Internal Medicine

## 2022-07-02 DIAGNOSIS — S32010A Wedge compression fracture of first lumbar vertebra, initial encounter for closed fracture: Secondary | ICD-10-CM | POA: Diagnosis not present

## 2022-07-02 DIAGNOSIS — M419 Scoliosis, unspecified: Secondary | ICD-10-CM | POA: Diagnosis not present

## 2022-07-02 DIAGNOSIS — M545 Low back pain, unspecified: Secondary | ICD-10-CM | POA: Diagnosis not present

## 2022-07-03 ENCOUNTER — Encounter: Payer: Self-pay | Admitting: Internal Medicine

## 2022-07-03 DIAGNOSIS — S32010A Wedge compression fracture of first lumbar vertebra, initial encounter for closed fracture: Secondary | ICD-10-CM | POA: Insufficient documentation

## 2022-07-09 ENCOUNTER — Encounter: Payer: Self-pay | Admitting: Neurology

## 2022-07-09 ENCOUNTER — Telehealth: Payer: Self-pay | Admitting: Internal Medicine

## 2022-07-09 ENCOUNTER — Ambulatory Visit (INDEPENDENT_AMBULATORY_CARE_PROVIDER_SITE_OTHER): Payer: 59 | Admitting: Neurology

## 2022-07-09 VITALS — BP 156/87 | HR 84 | Ht 64.0 in | Wt 214.6 lb

## 2022-07-09 DIAGNOSIS — G7000 Myasthenia gravis without (acute) exacerbation: Secondary | ICD-10-CM

## 2022-07-09 DIAGNOSIS — I480 Paroxysmal atrial fibrillation: Secondary | ICD-10-CM | POA: Diagnosis not present

## 2022-07-09 DIAGNOSIS — R0601 Orthopnea: Secondary | ICD-10-CM | POA: Diagnosis not present

## 2022-07-09 DIAGNOSIS — G4719 Other hypersomnia: Secondary | ICD-10-CM | POA: Diagnosis not present

## 2022-07-09 DIAGNOSIS — C642 Malignant neoplasm of left kidney, except renal pelvis: Secondary | ICD-10-CM | POA: Diagnosis not present

## 2022-07-09 NOTE — Patient Instructions (Addendum)
72 y.o. year old female  here with:  Myasthenia gravis and neuropathy, high fall risk.  Snoring. MRDD   She is falling because she is not lifting her feet, shuffling, but she has massive lower extremity edema. No NCV and EMG can be obtained on edematous legs-  will not be able to  to diagnose her further . She is not anticoagulated and she is not in pain from neuropathy.  It leaves her numb.   She needs to use her walker.        1) Fall risk has been elevated since  before 2020. Now suffered a compression fracture of the thoracic vertebra.  Had 4 falls this calendar year.   2) atrial fibrillation on amiodarone.  Neuropathy and myopathy can be induced by this medication.   3)  orthopnea- currently unable to get int her hospital bed.  Myasthenia is controlled,  Continue the CPAP therapy while she sleeps  in the living room, still snoring. Previous dx was OSA on CPAP.   She will inherit a new CPAP from a relative, that is an S 10 model by [employer]. Her sleep study 20 -4 2024 PSG was invalid, she slept barely 3 hours and apnea was below 5/h on CMS criterea.  I would  prefer for her to remain on CPAP and glad to hear that she can get a used machine. Set to 5-15 cm water, 3 cm EPR and   I plan to follow up either personally or through our NP within 6 months.   I would like to thank Dale Weslaco, MD and Dr Dutch Quint, MD .

## 2022-07-09 NOTE — Telephone Encounter (Signed)
Angie(physical therapy) was calling to check status of PT orders that were faxed on 5/16.? She's availale @[phone].

## 2022-07-09 NOTE — Telephone Encounter (Signed)
PT orders faxed.

## 2022-07-09 NOTE — Progress Notes (Signed)
Provider:  Melvyn Novas, MD  Primary Care Physician:  Dale Little Cedar, MD [address] [place] [address]     Referring Provider: Dale , Md [address] [place],  [place] [address]          Chief Complaint according to patient   Patient presents with:     New Patient (Initial Visit)           HISTORY OF PRESENT ILLNESS:  Madison Burns is a 72 y.o. female patient who is here for revisit 07/09/2022 for  follow up on CPAP , she is still snoring. .  Chief concern according to patient's sister  :  This established patient with MRDD here  with her sister. She is  here today to f/u with her Myasthenia gravis, sleep apnea , and kidney cancer.  She has fallen "a lot "and broke a foot and has a compression fracture of a vertebra.  This is a thoracic compression fracture that was already looked at by Dr. Dutch Quint and MRI was obtained and was made available to me by CD-ROM.  This is not a surgical case she may benefit from a kyphoplasty later on.  She goes to bed right now with Tylenol and a heating pad and does actually manage very well.  She cannot ascend into the hospital bed however with the current boot so she sleeps in a regular bed and she has a bedroom with her sister who reports that she still snoring.  Falls could also be precipitated by her amiodarone induced myopathy-neuropathy.  Atrial fibrillation, she presents today with an elevated blood pressure.  She has not had recent symptoms of atrial fibrillation  The patient is also taking vitamin D and vitamin C, B12 Biotene and calcium supplements.  In her she is also on Synthroid 75 mcg, methotrexate 2.5 mg, Protonix, she is taking Mestinon for myasthenia gravis 60 mg 3 times a day.  She has been on Detrol for bladder control she has been on Risperdal at bedtime for restless leg control.  She is falling because she is not lifting her feet, shuffling, but she has massive  lower extremity edema. No NCV and EMG will help to diagnose her further. She is not anticoagulated and she is not in pain from neuropathy.  It leaves her numb.  She needs to use her walker.           Today 09/04/21 Madison Burns is a 72 y.o. female with mental retardation, living with her sister.Here for 6 month MG and sleep f/u. Pt continues to snore and has started to wet the bed ever since her last fall, 2/19. Pt is such a light sleeper, currently taking risperidone. Had 3 falls in January and since has been wetting the bed.  Madison Burns has been diagnosed with renal cell cancer and began having enuresis- her Myasthenia has taken a turn for the worse. She is presenting with ptosis , neck weakness and left shoulder slump. She is no longer able to walk unassisted, presents stooped. Her voice is hoarse. Her mood is good, she lost weight , BMI 36.73 kg/m2.  She completed her cancer treatments. She brought me another drawing- I have a collection of the teddy brea drawings since 15 years ago. She is  awaiting a CT of the kidney or MRI.     Her screening for apnea by HST on 10-05-2020 was negative: Calculated pAHI (per hour):  The calculated AHI was only 3.5/h there was no breakdown in rem sleep and non-REM sleep possible.  No data of sleep position or snoring were provided either.                                                                 Oxygen Saturation Statistics:       O2 Saturation Range (%):   Oxygen saturation ranged between a nadir of 74% and a maximum saturation of 97% with a mean oxygenation at 93% SPO2. Time spent under 89% oxygen saturation amounted to 5.2 minutes or 1.4% of sleep time.    I like for [name] to have an in lab study- she has new findings ENURESIS< and she has lost weight, and she snores as witnessed by her sister- yet the HST did not reflect this.      08-22-2020: She is able to raise her voice and I can understand her quite clearly in spite of the mask.  She was  hospitalized in February of this year and discharged from the hospital fairly good.  Aero care I had such her up on 03/2015 so she would be due for new machine this year.  The current machine no longer connects and last data were recorded in April and then stopped so I need to rather soon replaces since she has myasthenia if not we will make sense for her to have a repeat sleep study I want to make sure that she does not need a BiPAP instead of her CPAP at this time.  She has reportedly been snoring even when using her machine there is a baby monitor in her room and her sister can hear her snoring this way.  The patient's Epworth sleepiness score is endorsed at 14 points and the fatigue severity score at 58 points of both have increased which can also be a side effect of her malignancy.  Her current compliance from April is excellent 100% of days the machine was used and 79% over 4 hours with a residual AHI of 1.8 she only used 5 cm water pressure with 3 cm EPR due to a mostly upper airway resistance syndrome at the time that she was set up. She is sleeping in a hospital bed with elevated head.        She was last seen by Shawnie Dapper , NP on 09-06-2019.  She also has developed wheezing in the meantime but she last saw Amy she presented much less impaired.  In the meantime which she had called our office.  She has completed her chemoradiation therapy and she is certainly has a weaker immune system she also developed pneumonia and sepsis following her cancer treatment and surgery.  She was seen at the [facility] emergency room just 10 days ago on 9-17 21 with breathing difficulties shortness of breath thought to be due to left-sided pneumonia and possible myasthenia gravis exacerbation.  She underwent treatment with antibiotics and IVIG and ultimately was discharged after week.  She gets short of breath now with exertion still, sometimes she is wheezing. She is sleeping in a hospital bed, and she  cannot breath  easily- has orthopnea. This bed is adjustable and helps her shortness of breath.       Review  of Systems: Out of a complete 14 system review, the patient complains of only the following symptoms, and all other reviewed systems are negative.:  Fatigue, sleepiness , snoring, orthopnoea.    How likely are you to doze in the following situations: 0 = not likely, 1 = slight chance, 2 = moderate chance, 3 = high chance   Sitting and Reading? Watching Television? Sitting inactive in a public place (theater or meeting)? As a passenger in a car for an hour without a break? Lying down in the afternoon when circumstances permit? Sitting and talking to someone? Sitting quietly after lunch without alcohol? In a car, while stopped for a few minutes in traffic?   Total = 16/ 24 points   FSS endorsed at 45/ 63 points.   Social History   Socioeconomic History   Marital status: Single    Spouse name: Not on file   Number of children: 0   Years of education: Special Ed   Highest education level: Not on file  Occupational History   Occupation: Unemployed  Tobacco Use   Smoking status: Never   Smokeless tobacco: Never  Vaping Use   Vaping Use: Never used  Substance and Sexual Activity   Alcohol use: No    Alcohol/week: 0.0 standard drinks of alcohol   Drug use: No   Sexual activity: Not Currently  Other Topics Concern   Not on file  Social History Narrative   06/23/17 lives with sister, Harriett Sine   Regular exercise-no   Caffeine Use-yes   Social Determinants of Health   Financial Resource Strain: Low Risk  (05/30/2022)   Overall Financial Resource Strain (CARDIA)    Difficulty of Paying Living Expenses: Not hard at all  Food Insecurity: No Food Insecurity (05/30/2022)   Hunger Vital Sign    Worried About Running Out of Food in the Last Year: Never true    Ran Out of Food in the Last Year: Never true  Transportation Needs: No Transportation Needs (05/30/2022)   PRAPARE -  Administrator, Civil Service (Medical): No    Lack of Transportation (Non-Medical): No  Physical Activity: Insufficiently Active (05/30/2022)   Exercise Vital Sign    Days of Exercise per Week: 5 days    Minutes of Exercise per Session: 20 min  Stress: No Stress Concern Present (05/30/2022)   [employer] of Occupational Health - Occupational Stress Questionnaire    Feeling of Stress : Not at all  Social Connections: Unknown (05/30/2022)   Social Connection and Isolation Panel [NHANES]    Frequency of Communication with Friends and Family: Not on file    Frequency of Social Gatherings with Friends and Family: More than three times a week    Attends Religious Services: Not on Marketing executive or Organizations: Not on file    Attends Banker Meetings: Not on file    Marital Status: Not on file    Family History  Problem Relation Age of Onset   Colon cancer Maternal Grandfather        stomach/colon   Heart disease Father        myocardial infarction   Hyperlipidemia Father    Hyperlipidemia Sister    Diabetes Sister    Bone cancer Other        cousin   Alzheimer's disease Mother        maternal aunts   Skin cancer Mother    Myasthenia gravis Brother  ocular   Skin cancer Brother    Stroke Paternal Grandfather    Breast cancer Neg Hx     Past Medical History:  Diagnosis Date   Cancer of kidney (HCC) 2021   Complication of anesthesia    Myasthenia gravis   GERD (gastroesophageal reflux disease)    History of seizure disorder    Hypercholesterolemia    Hypertension    no longer has it. wt loss   Hypokalemia 04/10/2022   Hyponatremia 04/10/2022   Hypothyroidism    Irritable bowel syndrome    Lactic acidosis 04/10/2022   Melanoma (HCC) 2017   Melanoma on neck   Myasthenia gravis (HCC) 2011   OSA on CPAP    Thymoma, malignant (HCC)     Past Surgical History:  Procedure Laterality Date   ABDOMINAL HYSTERECTOMY  1990    APPENDECTOMY  1999   CHOLECYSTECTOMY  2013   COLONOSCOPY WITH PROPOFOL N/A 06/30/2017   Procedure: COLONOSCOPY WITH PROPOFOL;  Surgeon: Toledo, Boykin Nearing, MD;  Location: [facility];  Service: Gastroenterology;  Laterality: N/A;   CYSTOSCOPY W/ URETERAL STENT PLACEMENT Left 08/17/2019   Procedure: CYSTOSCOPY WITH RETROGRADE PYELOGRAM/URETERAL STENT PLACEMENT;  Surgeon: Sebastian Ache, MD;  Location: [facility];  Service: Urology;  Laterality: Left;  30 MINS   ESOPHAGOGASTRODUODENOSCOPY (EGD) WITH PROPOFOL N/A 06/30/2017   Procedure: ESOPHAGOGASTRODUODENOSCOPY (EGD) WITH PROPOFOL;  Surgeon: Toledo, Boykin Nearing, MD;  Location: [facility];  Service: Gastroenterology;  Laterality: N/A;   IR RADIOLOGIST EVAL & MGMT  06/28/2019   IR RADIOLOGIST EVAL & MGMT  09/14/2019   IR RADIOLOGIST EVAL & MGMT  03/28/2020   IR RADIOLOGIST EVAL & MGMT  10/10/2020   IR RADIOLOGIST EVAL & MGMT  11/22/2021   RADIOFREQUENCY ABLATION Left 08/17/2019   Procedure: LEFT RENAL CRYO ABLATION;  Surgeon: Malachy Moan, MD;  Location: [facility];  Service: Anesthesiology;  Laterality: Left;   TONSILLECTOMY  1959     Current Outpatient Medications on File Prior to Visit  Medication Sig Dispense Refill   acetaminophen (TYLENOL) 325 MG tablet Take 2 tablets (650 mg total) by mouth every 6 (six) hours as needed for mild pain (or Fever >/= 101).     amiodarone (PACERONE) 200 MG tablet Take 1 tablet (200 mg total) by mouth daily. 14 tablet 0   Ascorbic Acid (VITAMIN C) 1000 MG tablet Take 1,000 mg by mouth at bedtime.     B Complex-C (B-COMPLEX WITH VITAMIN C) tablet Take 1 tablet by mouth every evening.     benzonatate (TESSALON) 100 MG capsule TAKE 1 CAPSULE BY MOUTH TWICE DAILY AS NEEDED FOR COUGH 90 capsule 0   Biotin 08657 MCG TABS Take 10,000 mcg by mouth daily at 2 PM. (1430)     Calcium-Phosphorus-Vitamin D (CITRACAL +D3 PO) Take 1 tablet by mouth daily.     desmopressin (DDAVP NASAL) 0.01 % solution Place 1 spray (10 mcg  total) into the nose 2 (two) times daily. 5 mL 12   diltiazem (CARDIZEM CD) 180 MG 24 hr capsule Take 180 mg by mouth daily.     DULoxetine (CYMBALTA) 30 MG capsule TAKE 1 CAPSULE BY MOUTH ONCE DAILY 30 capsule 2   folic acid (FOLVITE) 1 MG tablet TAKE 2 TABLETS BY MOUTH ONCE DAILY 60 tablet 11   Garlic 1000 MG CAPS Take 1,000 mg by mouth in the morning, at noon, and at bedtime. (0800, 1430, 2000)     levothyroxine (SYNTHROID) 75 MCG tablet TAKE 1 TABLET BY MOUTH ONCE DAILY  ON AN EMPTY STOMACH. WAIT 30 MINUTES BEFORE TAKING OTHER MEDS. 90 tablet 1   magnesium hydroxide (MILK OF MAGNESIA) 400 MG/5ML suspension Take 30 mLs by mouth daily as needed for mild constipation. 355 mL 0   methotrexate (RHEUMATREX) 2.5 MG tablet TAKE 4 TABLETS BY MOUTH ONCE A WEEK WITHFOLIC ACID 16 tablet 5   pantoprazole (PROTONIX) 40 MG tablet Take 40 mg by mouth daily.      Probiotic Product (PROBIOTIC MULTI-ENZYME PO) Take 1 capsule by mouth daily. Probiotic Multi-Enzyme Digestive Formula     pyridostigmine (MESTINON) 60 MG tablet TAKE 1 TABLET BY MOUTH 3 TIMES DAILY 90 tablet 3   risperiDONE (RISPERDAL) 1 MG tablet Take 1 mg by mouth at bedtime.      tolterodine (DETROL LA) 4 MG 24 hr capsule TAKE 1 CAPSULE BY MOUTH ONCE EVERY MORNING 90 capsule 1   vitamin B-12 (CYANOCOBALAMIN) 250 MCG tablet Take 250 mcg by mouth in the morning, at noon, and at bedtime.     vitamin E 180 MG (400 UNITS) capsule Take 400 Units by mouth daily at 2 PM. 1430     No current facility-administered medications on file prior to visit.     DIAGNOSTIC DATA (LABS, IMAGING, TESTING) - I reviewed patient records, labs, notes, testing and imaging myself where available.  Lab Results  Component Value Date   WBC 10.4 06/02/2022   HGB 13.0 06/02/2022   HCT 39.9 06/02/2022   MCV 91.1 06/02/2022   PLT 248 06/02/2022      Component Value Date/Time   NA 130 (L) 06/02/2022 2104   NA 135 03/06/2021 1407   NA 137 02/21/2012 1828   K 3.8  06/02/2022 2104   K 4.3 02/21/2012 1828   CL 96 (L) 06/02/2022 2104   CL 104 02/21/2012 1828   CO2 27 06/02/2022 2104   CO2 25 02/21/2012 1828   GLUCOSE 113 (H) 06/02/2022 2104   GLUCOSE 145 (H) 02/21/2012 1828   BUN 15 06/02/2022 2104   BUN 16 03/06/2021 1407   BUN 8 02/21/2012 1828   CREATININE 1.19 (H) 06/02/2022 2104   CREATININE 1.02 (H) 05/15/2022 0952   CREATININE 1.14 02/21/2012 1828   CALCIUM 8.3 (L) 06/02/2022 2104   CALCIUM 9.0 02/21/2012 1828   PROT 6.5 06/02/2022 2104   PROT 6.3 03/06/2021 1407   PROT 6.4 04/25/2011 0854   ALBUMIN 3.7 06/02/2022 2104   ALBUMIN 4.5 03/06/2021 1407   ALBUMIN 3.4 04/25/2011 0854   AST 33 06/02/2022 2104   AST 32 05/15/2022 0952   ALT 23 06/02/2022 2104   ALT 20 05/15/2022 0952   ALT 25 04/25/2011 0854   ALKPHOS 72 06/02/2022 2104   ALKPHOS 45 (L) 04/25/2011 0854   BILITOT 0.6 06/02/2022 2104   BILITOT 0.6 05/15/2022 0952   GFRNONAA 49 (L) 06/02/2022 2104   GFRNONAA 59 (L) 05/15/2022 0952   GFRNONAA 52 (L) 02/21/2012 1828   GFRAA >60 11/04/2019 1725   GFRAA >60 02/21/2012 1828   Lab Results  Component Value Date   CHOL 238 (H) 01/08/2022   HDL 60.60 01/08/2022   LDLCALC 152 (H) 01/08/2022   LDLDIRECT 122.0 12/14/2018   TRIG 129.0 01/08/2022   CHOLHDL 4 01/08/2022   Lab Results  Component Value Date   HGBA1C 5.6 01/08/2022   No results found for: "VITAMINB12" Lab Results  Component Value Date   TSH 4.346 04/01/2022    PHYSICAL EXAM:  Today's Vitals   07/09/22 1441  BP: (!) 156/87  Pulse: 84  Weight: 214 lb 9.6 oz (97.3 kg)  Height: 5\' 4"  (1.626 m)   Body mass index is 36.84 kg/m.   Wt Readings from Last 3 Encounters:  07/09/22 214 lb 9.6 oz (97.3 kg)  05/30/22 225 lb (102.1 kg)  05/28/22 225 lb (102.1 kg)     Ht Readings from Last 3 Encounters:  07/09/22 5\' 4"  (1.626 m)  05/30/22 5\' 3"  (1.6 m)  05/28/22 5\' 3"  (1.6 m)      General: Neck size : 15" . Mallampati  I 3, small lower jaw. Retrognathia   Lungs wheezing to auscultation, patient cannot whistle not hold her breath longer than 12 seconds-   Generalized: Well developed, in no acute distress . She is fatigued, she is stooped, SOB.  Ankle edema, bilateral .Right over left.  Neurological examination - alert but not fluent, answers questions with some delay.  Mentation: Madison Burns is developmentally delayed, unable to live alone, she is not fully oriented to place and date.  Cranial nerve : No change in taste or smell reported.  Pupils were equal round reactive to light.  Extraocular movements were full, ptosis bilaterally. Head droopy, left leaning.  Facial sensation and strength were normal. Uvula in midline.  Head turning and shoulder shrug  were weak.    Motor: she feels sore all over, proximal arms and at the hip. Knees, joint pain. She cannot extend either arm above shoulder  level for longer than 10 second, .  Symmetric grip strength noted.  Sensory: cannot feel soft touch and vibration-  neither on knees nor at ankles.  Coordination: good finger-nose bilaterally.  Gait and station: Gait is stooped, she is SOB, her feet are shuffling, turning with 5 steps, very unsteady. She needs to use her walker-  Leaning to the left. I deferred a tandem gait, heel or toe stand.   Reflexes: Deep tendon reflexes are absent bilaterally.    ASSESSMENT AND PLAN 72 y.o. year old female  here with:  Myasthenia gravis and neuropathy, high fall risk.   She is falling because she is not lifting her feet, shuffling, but she has massive lower extremity edema. No NCV and EMG will help to diagnose her further. She is not anticoagulated and she is not in pain from neuropathy.  It leaves her numb.   She needs to use her walker.        1) Fall risk has been elevated since 2020. Now suffered a compression fracture of the thoracic vertebra.  Had 4 falls this calendar year.   2) atrial fibrillation on amiodarone.  Neuropathy and myopathy can be induced  by this medication.   3)  orthopnea- currently unable to get int her hospital bed.  Continue the therapy in the living room, still snoring. OSA on CPAP.   She will inherit a new CPAP from a relative, that is an S 10 model by [employer]. Her sleep study 20 -4 2024 PSG was invalid, she slept barely 3 hours and apnea was below 5/h on CMS criterea.  I would b prefer for her to remain on CPAP and glad to hear that she can get a used machine. Set to 5-15 cm water, 3 cm EPR and   I plan to follow up either personally or through our NP within 6 months.   I would like to thank Dale Montgomery, MD and Dr Dutch Quint, MD .   After spending a total time of  35  minutes face to face and  additional time for physical and neurologic examination, review of laboratory studies,  personal review of imaging studies, reports and results of other testing and review of referral information / records as far as provided in visit,   Electronically signed by: Melvyn Novas, MD 07/09/2022 3:10 PM  [facility] Board certified by The [employer] of Sleep Medicine and Diplomate of the [employer] of Sleep Medicine. Board certified In Neurology through the ABPN, Fellow of the [employer] of Neurology. Medical Director of [facility].

## 2022-07-15 ENCOUNTER — Ambulatory Visit: Payer: Medicare Other | Admitting: Neurology

## 2022-07-16 DIAGNOSIS — M7981 Nontraumatic hematoma of soft tissue: Secondary | ICD-10-CM | POA: Diagnosis not present

## 2022-07-16 DIAGNOSIS — N3281 Overactive bladder: Secondary | ICD-10-CM | POA: Diagnosis not present

## 2022-07-16 DIAGNOSIS — I480 Paroxysmal atrial fibrillation: Secondary | ICD-10-CM | POA: Diagnosis not present

## 2022-07-16 DIAGNOSIS — E871 Hypo-osmolality and hyponatremia: Secondary | ICD-10-CM | POA: Diagnosis not present

## 2022-07-16 DIAGNOSIS — Z85828 Personal history of other malignant neoplasm of skin: Secondary | ICD-10-CM | POA: Diagnosis not present

## 2022-07-16 DIAGNOSIS — K589 Irritable bowel syndrome without diarrhea: Secondary | ICD-10-CM | POA: Diagnosis not present

## 2022-07-16 DIAGNOSIS — E039 Hypothyroidism, unspecified: Secondary | ICD-10-CM | POA: Diagnosis not present

## 2022-07-16 DIAGNOSIS — R911 Solitary pulmonary nodule: Secondary | ICD-10-CM | POA: Diagnosis not present

## 2022-07-16 DIAGNOSIS — E869 Volume depletion, unspecified: Secondary | ICD-10-CM | POA: Diagnosis not present

## 2022-07-16 DIAGNOSIS — G7000 Myasthenia gravis without (acute) exacerbation: Secondary | ICD-10-CM | POA: Diagnosis not present

## 2022-07-16 DIAGNOSIS — E7800 Pure hypercholesterolemia, unspecified: Secondary | ICD-10-CM | POA: Diagnosis not present

## 2022-07-16 DIAGNOSIS — L89626 Pressure-induced deep tissue damage of left heel: Secondary | ICD-10-CM | POA: Diagnosis not present

## 2022-07-16 DIAGNOSIS — Z85238 Personal history of other malignant neoplasm of thymus: Secondary | ICD-10-CM | POA: Diagnosis not present

## 2022-07-16 DIAGNOSIS — Z85528 Personal history of other malignant neoplasm of kidney: Secondary | ICD-10-CM | POA: Diagnosis not present

## 2022-07-16 DIAGNOSIS — G4733 Obstructive sleep apnea (adult) (pediatric): Secondary | ICD-10-CM | POA: Diagnosis not present

## 2022-07-16 DIAGNOSIS — I10 Essential (primary) hypertension: Secondary | ICD-10-CM | POA: Diagnosis not present

## 2022-07-16 DIAGNOSIS — K760 Fatty (change of) liver, not elsewhere classified: Secondary | ICD-10-CM | POA: Diagnosis not present

## 2022-07-16 DIAGNOSIS — K219 Gastro-esophageal reflux disease without esophagitis: Secondary | ICD-10-CM | POA: Diagnosis not present

## 2022-07-16 DIAGNOSIS — S62357D Nondisplaced fracture of shaft of fifth metacarpal bone, left hand, subsequent encounter for fracture with routine healing: Secondary | ICD-10-CM | POA: Diagnosis not present

## 2022-07-21 ENCOUNTER — Telehealth: Payer: Self-pay | Admitting: Internal Medicine

## 2022-07-21 NOTE — Telephone Encounter (Signed)
Patient's sister Harriett Sine, [phone]. Is requesting orders for OT, from [facility]. She needs help with bathing.

## 2022-07-22 NOTE — Telephone Encounter (Signed)
OT orders have already been faxed to [facility]. LM for sister to let her know.

## 2022-07-23 DIAGNOSIS — K219 Gastro-esophageal reflux disease without esophagitis: Secondary | ICD-10-CM | POA: Diagnosis not present

## 2022-07-23 DIAGNOSIS — Z85238 Personal history of other malignant neoplasm of thymus: Secondary | ICD-10-CM | POA: Diagnosis not present

## 2022-07-23 DIAGNOSIS — E871 Hypo-osmolality and hyponatremia: Secondary | ICD-10-CM | POA: Diagnosis not present

## 2022-07-23 DIAGNOSIS — K760 Fatty (change of) liver, not elsewhere classified: Secondary | ICD-10-CM | POA: Diagnosis not present

## 2022-07-23 DIAGNOSIS — Z85828 Personal history of other malignant neoplasm of skin: Secondary | ICD-10-CM | POA: Diagnosis not present

## 2022-07-23 DIAGNOSIS — S62357D Nondisplaced fracture of shaft of fifth metacarpal bone, left hand, subsequent encounter for fracture with routine healing: Secondary | ICD-10-CM | POA: Diagnosis not present

## 2022-07-23 DIAGNOSIS — E869 Volume depletion, unspecified: Secondary | ICD-10-CM | POA: Diagnosis not present

## 2022-07-23 DIAGNOSIS — G7000 Myasthenia gravis without (acute) exacerbation: Secondary | ICD-10-CM | POA: Diagnosis not present

## 2022-07-23 DIAGNOSIS — L89626 Pressure-induced deep tissue damage of left heel: Secondary | ICD-10-CM | POA: Diagnosis not present

## 2022-07-23 DIAGNOSIS — I480 Paroxysmal atrial fibrillation: Secondary | ICD-10-CM | POA: Diagnosis not present

## 2022-07-23 DIAGNOSIS — G4733 Obstructive sleep apnea (adult) (pediatric): Secondary | ICD-10-CM | POA: Diagnosis not present

## 2022-07-23 DIAGNOSIS — I10 Essential (primary) hypertension: Secondary | ICD-10-CM | POA: Diagnosis not present

## 2022-07-23 DIAGNOSIS — N3281 Overactive bladder: Secondary | ICD-10-CM | POA: Diagnosis not present

## 2022-07-23 DIAGNOSIS — E039 Hypothyroidism, unspecified: Secondary | ICD-10-CM | POA: Diagnosis not present

## 2022-07-23 DIAGNOSIS — E7800 Pure hypercholesterolemia, unspecified: Secondary | ICD-10-CM | POA: Diagnosis not present

## 2022-07-23 DIAGNOSIS — K589 Irritable bowel syndrome without diarrhea: Secondary | ICD-10-CM | POA: Diagnosis not present

## 2022-07-23 DIAGNOSIS — M7981 Nontraumatic hematoma of soft tissue: Secondary | ICD-10-CM | POA: Diagnosis not present

## 2022-07-23 DIAGNOSIS — Z85528 Personal history of other malignant neoplasm of kidney: Secondary | ICD-10-CM | POA: Diagnosis not present

## 2022-07-23 DIAGNOSIS — R911 Solitary pulmonary nodule: Secondary | ICD-10-CM | POA: Diagnosis not present

## 2022-07-23 NOTE — Telephone Encounter (Signed)
Patient's sister called and note was read.

## 2022-07-23 NOTE — Telephone Encounter (Signed)
Noted  

## 2022-07-28 ENCOUNTER — Ambulatory Visit (INDEPENDENT_AMBULATORY_CARE_PROVIDER_SITE_OTHER): Payer: 59 | Admitting: Podiatry

## 2022-07-28 DIAGNOSIS — M79675 Pain in left toe(s): Secondary | ICD-10-CM

## 2022-07-28 DIAGNOSIS — B351 Tinea unguium: Secondary | ICD-10-CM

## 2022-07-28 DIAGNOSIS — M79674 Pain in right toe(s): Secondary | ICD-10-CM

## 2022-07-28 DIAGNOSIS — S93402A Sprain of unspecified ligament of left ankle, initial encounter: Secondary | ICD-10-CM | POA: Diagnosis not present

## 2022-07-29 ENCOUNTER — Encounter: Payer: Self-pay | Admitting: Podiatry

## 2022-07-29 DIAGNOSIS — I480 Paroxysmal atrial fibrillation: Secondary | ICD-10-CM | POA: Diagnosis not present

## 2022-07-29 DIAGNOSIS — E869 Volume depletion, unspecified: Secondary | ICD-10-CM | POA: Diagnosis not present

## 2022-07-29 DIAGNOSIS — N3281 Overactive bladder: Secondary | ICD-10-CM | POA: Diagnosis not present

## 2022-07-29 DIAGNOSIS — Z85238 Personal history of other malignant neoplasm of thymus: Secondary | ICD-10-CM | POA: Diagnosis not present

## 2022-07-29 DIAGNOSIS — L89626 Pressure-induced deep tissue damage of left heel: Secondary | ICD-10-CM | POA: Diagnosis not present

## 2022-07-29 DIAGNOSIS — Z85828 Personal history of other malignant neoplasm of skin: Secondary | ICD-10-CM | POA: Diagnosis not present

## 2022-07-29 DIAGNOSIS — G7000 Myasthenia gravis without (acute) exacerbation: Secondary | ICD-10-CM | POA: Diagnosis not present

## 2022-07-29 DIAGNOSIS — E7800 Pure hypercholesterolemia, unspecified: Secondary | ICD-10-CM | POA: Diagnosis not present

## 2022-07-29 DIAGNOSIS — E871 Hypo-osmolality and hyponatremia: Secondary | ICD-10-CM | POA: Diagnosis not present

## 2022-07-29 DIAGNOSIS — K760 Fatty (change of) liver, not elsewhere classified: Secondary | ICD-10-CM | POA: Diagnosis not present

## 2022-07-29 DIAGNOSIS — R911 Solitary pulmonary nodule: Secondary | ICD-10-CM | POA: Diagnosis not present

## 2022-07-29 DIAGNOSIS — G4733 Obstructive sleep apnea (adult) (pediatric): Secondary | ICD-10-CM | POA: Diagnosis not present

## 2022-07-29 DIAGNOSIS — I10 Essential (primary) hypertension: Secondary | ICD-10-CM | POA: Diagnosis not present

## 2022-07-29 DIAGNOSIS — K589 Irritable bowel syndrome without diarrhea: Secondary | ICD-10-CM | POA: Diagnosis not present

## 2022-07-29 DIAGNOSIS — S62357D Nondisplaced fracture of shaft of fifth metacarpal bone, left hand, subsequent encounter for fracture with routine healing: Secondary | ICD-10-CM | POA: Diagnosis not present

## 2022-07-29 DIAGNOSIS — K219 Gastro-esophageal reflux disease without esophagitis: Secondary | ICD-10-CM | POA: Diagnosis not present

## 2022-07-29 DIAGNOSIS — M7981 Nontraumatic hematoma of soft tissue: Secondary | ICD-10-CM | POA: Diagnosis not present

## 2022-07-29 DIAGNOSIS — Z85528 Personal history of other malignant neoplasm of kidney: Secondary | ICD-10-CM | POA: Diagnosis not present

## 2022-07-29 DIAGNOSIS — E039 Hypothyroidism, unspecified: Secondary | ICD-10-CM | POA: Diagnosis not present

## 2022-07-29 NOTE — Progress Notes (Signed)
  Subjective:  Patient ID: Madison Burns, female    DOB: 1950-08-27,  MRN: [id number]  Chief Complaint  Patient presents with   Foot Ulcer    4 week follow up left heel pressure ulcer   Nail Problem    9 week nail trim    72 y.o. female presents with the above complaint. History confirmed with patient.  Nails are thickened and elongated.  She is here with her sister.  Overall doing much better the wound has healed with the exception of a small area on the front of the ankle that they are still working on.  Objective:  Physical Exam: warm, good capillary refill, normal DP and PT pulses, and deep tissue injury is healed, still healing skin on anteromedial ankle Left Foot: dystrophic yellowed discolored nail plates with subungual debris Right Foot: dystrophic yellowed discolored nail plates with subungual debris  Radiographs from ER 06/19/2022 reviewed there is an age-indeterminate dorsal talar neck radiodense body, lateral talar process small avulsion Assessment:   1. Pain due to onychomycosis of toenails of both feet      Plan:  Patient was evaluated and treated and all questions answered.  Discussed the etiology and treatment options for the condition in detail with the patient.  Prior nail debridement has been helpful. Recommended debridement of the nails today. Sharp and mechanical debridement performed of all painful and mycotic nails today. Nails debrided in length and thickness using a nail nipper to level of comfort. Discussed treatment options including appropriate shoe gear. Follow up as needed for painful nails.  Following next visit they will return to regular pedicures to take care of these long-term once she is fully healed   Return in about 10 weeks (around 10/06/2022) for painful thick fungal nails.

## 2022-07-31 ENCOUNTER — Ambulatory Visit
Admission: RE | Admit: 2022-07-31 | Discharge: 2022-07-31 | Disposition: A | Discharge status: Home / Self Care | Payer: 59 | Source: Ambulatory Visit | Attending: Internal Medicine | Admitting: Internal Medicine

## 2022-07-31 DIAGNOSIS — E2839 Other primary ovarian failure: Secondary | ICD-10-CM

## 2022-07-31 DIAGNOSIS — Z1231 Encounter for screening mammogram for malignant neoplasm of breast: Secondary | ICD-10-CM | POA: Insufficient documentation

## 2022-07-31 DIAGNOSIS — M85852 Other specified disorders of bone density and structure, left thigh: Secondary | ICD-10-CM | POA: Diagnosis not present

## 2022-07-31 DIAGNOSIS — Z780 Asymptomatic menopausal state: Secondary | ICD-10-CM | POA: Diagnosis not present

## 2022-08-04 ENCOUNTER — Telehealth: Payer: Self-pay

## 2022-08-04 NOTE — Telephone Encounter (Signed)
Noted  

## 2022-08-04 NOTE — Telephone Encounter (Signed)
Pt sister returned [place] LPN call. Note below was read to her. She's aware and understood.

## 2022-08-04 NOTE — Telephone Encounter (Signed)
-----   Message from Dale [place], MD sent at 08/01/2022  4:36 AM EDT ----- Madison Burns ([name]'s sister) that [name]'s bone density reveals osteopenia.  This means she has had some bone loss, but does not meet criteria for osteoporosis.  Recommend calcium, vitamin D.  We will discuss further at her upcoming appt. Please make note on schedule.

## 2022-08-06 DIAGNOSIS — S32010A Wedge compression fracture of first lumbar vertebra, initial encounter for closed fracture: Secondary | ICD-10-CM | POA: Diagnosis not present

## 2022-08-07 ENCOUNTER — Telehealth: Payer: Self-pay

## 2022-08-07 DIAGNOSIS — E869 Volume depletion, unspecified: Secondary | ICD-10-CM | POA: Diagnosis not present

## 2022-08-07 DIAGNOSIS — N3281 Overactive bladder: Secondary | ICD-10-CM | POA: Diagnosis not present

## 2022-08-07 DIAGNOSIS — E871 Hypo-osmolality and hyponatremia: Secondary | ICD-10-CM | POA: Diagnosis not present

## 2022-08-07 DIAGNOSIS — G7000 Myasthenia gravis without (acute) exacerbation: Secondary | ICD-10-CM | POA: Diagnosis not present

## 2022-08-07 DIAGNOSIS — R911 Solitary pulmonary nodule: Secondary | ICD-10-CM | POA: Diagnosis not present

## 2022-08-07 DIAGNOSIS — E7800 Pure hypercholesterolemia, unspecified: Secondary | ICD-10-CM | POA: Diagnosis not present

## 2022-08-07 DIAGNOSIS — F79 Unspecified intellectual disabilities: Secondary | ICD-10-CM

## 2022-08-07 DIAGNOSIS — G40909 Epilepsy, unspecified, not intractable, without status epilepticus: Secondary | ICD-10-CM

## 2022-08-07 DIAGNOSIS — S62357D Nondisplaced fracture of shaft of fifth metacarpal bone, left hand, subsequent encounter for fracture with routine healing: Secondary | ICD-10-CM | POA: Diagnosis not present

## 2022-08-07 DIAGNOSIS — E039 Hypothyroidism, unspecified: Secondary | ICD-10-CM | POA: Diagnosis not present

## 2022-08-07 DIAGNOSIS — K219 Gastro-esophageal reflux disease without esophagitis: Secondary | ICD-10-CM | POA: Diagnosis not present

## 2022-08-07 DIAGNOSIS — M7981 Nontraumatic hematoma of soft tissue: Secondary | ICD-10-CM | POA: Diagnosis not present

## 2022-08-07 DIAGNOSIS — Z85828 Personal history of other malignant neoplasm of skin: Secondary | ICD-10-CM | POA: Diagnosis not present

## 2022-08-07 DIAGNOSIS — I10 Essential (primary) hypertension: Secondary | ICD-10-CM | POA: Diagnosis not present

## 2022-08-07 DIAGNOSIS — L89626 Pressure-induced deep tissue damage of left heel: Secondary | ICD-10-CM | POA: Diagnosis not present

## 2022-08-07 DIAGNOSIS — K589 Irritable bowel syndrome without diarrhea: Secondary | ICD-10-CM | POA: Diagnosis not present

## 2022-08-07 DIAGNOSIS — Z85528 Personal history of other malignant neoplasm of kidney: Secondary | ICD-10-CM | POA: Diagnosis not present

## 2022-08-07 DIAGNOSIS — I480 Paroxysmal atrial fibrillation: Secondary | ICD-10-CM | POA: Diagnosis not present

## 2022-08-07 DIAGNOSIS — G4733 Obstructive sleep apnea (adult) (pediatric): Secondary | ICD-10-CM | POA: Diagnosis not present

## 2022-08-07 DIAGNOSIS — Z85238 Personal history of other malignant neoplasm of thymus: Secondary | ICD-10-CM | POA: Diagnosis not present

## 2022-08-07 DIAGNOSIS — K760 Fatty (change of) liver, not elsewhere classified: Secondary | ICD-10-CM | POA: Diagnosis not present

## 2022-08-07 NOTE — Telephone Encounter (Signed)
Signed and placed in box.   

## 2022-08-07 NOTE — Telephone Encounter (Signed)
Lars Mage is calling from [facility] to follow-up on a faxed request for an order for a social worker to come out and help patient's sister with resources that may be available to assist patient during her recovery from surgery.  Juan states [facility] has a Child psychotherapist, but they can't send her out until the order has been approved.

## 2022-08-07 NOTE — Telephone Encounter (Signed)
Faxed to [facility]

## 2022-08-07 NOTE — Telephone Encounter (Signed)
Orders printed for signature.

## 2022-08-08 ENCOUNTER — Other Ambulatory Visit: Payer: Self-pay | Admitting: Oncology

## 2022-08-08 ENCOUNTER — Ambulatory Visit: Payer: Self-pay

## 2022-08-08 NOTE — Patient Outreach (Signed)
  Care Coordination   08/08/2022 Name: Madison Burns MRN: [id number] DOB: December 07, 1950   Care Coordination Outreach Attempts:  An unsuccessful telephone outreach was attempted for a scheduled appointment today. HIPAA compliant message left.  Follow Up Plan:  Additional outreach attempts will be made to offer the patient care coordination information and services.   Encounter Outcome:  No Answer   Care Coordination Interventions:  No, not indicated    George Ina Watts [facility] Care Coordination [phone] direct line

## 2022-08-11 DIAGNOSIS — R911 Solitary pulmonary nodule: Secondary | ICD-10-CM | POA: Diagnosis not present

## 2022-08-11 DIAGNOSIS — K760 Fatty (change of) liver, not elsewhere classified: Secondary | ICD-10-CM | POA: Diagnosis not present

## 2022-08-11 DIAGNOSIS — I480 Paroxysmal atrial fibrillation: Secondary | ICD-10-CM | POA: Diagnosis not present

## 2022-08-11 DIAGNOSIS — E039 Hypothyroidism, unspecified: Secondary | ICD-10-CM | POA: Diagnosis not present

## 2022-08-11 DIAGNOSIS — E871 Hypo-osmolality and hyponatremia: Secondary | ICD-10-CM | POA: Diagnosis not present

## 2022-08-11 DIAGNOSIS — Z961 Presence of intraocular lens: Secondary | ICD-10-CM | POA: Diagnosis not present

## 2022-08-11 DIAGNOSIS — Z85528 Personal history of other malignant neoplasm of kidney: Secondary | ICD-10-CM | POA: Diagnosis not present

## 2022-08-11 DIAGNOSIS — Z85238 Personal history of other malignant neoplasm of thymus: Secondary | ICD-10-CM | POA: Diagnosis not present

## 2022-08-11 DIAGNOSIS — N3281 Overactive bladder: Secondary | ICD-10-CM | POA: Diagnosis not present

## 2022-08-11 DIAGNOSIS — H43813 Vitreous degeneration, bilateral: Secondary | ICD-10-CM | POA: Diagnosis not present

## 2022-08-11 DIAGNOSIS — L89626 Pressure-induced deep tissue damage of left heel: Secondary | ICD-10-CM | POA: Diagnosis not present

## 2022-08-11 DIAGNOSIS — I10 Essential (primary) hypertension: Secondary | ICD-10-CM | POA: Diagnosis not present

## 2022-08-11 DIAGNOSIS — E869 Volume depletion, unspecified: Secondary | ICD-10-CM | POA: Diagnosis not present

## 2022-08-11 DIAGNOSIS — Z85828 Personal history of other malignant neoplasm of skin: Secondary | ICD-10-CM | POA: Diagnosis not present

## 2022-08-11 DIAGNOSIS — M7981 Nontraumatic hematoma of soft tissue: Secondary | ICD-10-CM | POA: Diagnosis not present

## 2022-08-11 DIAGNOSIS — K219 Gastro-esophageal reflux disease without esophagitis: Secondary | ICD-10-CM | POA: Diagnosis not present

## 2022-08-11 DIAGNOSIS — E7800 Pure hypercholesterolemia, unspecified: Secondary | ICD-10-CM | POA: Diagnosis not present

## 2022-08-11 DIAGNOSIS — S62357D Nondisplaced fracture of shaft of fifth metacarpal bone, left hand, subsequent encounter for fracture with routine healing: Secondary | ICD-10-CM | POA: Diagnosis not present

## 2022-08-11 DIAGNOSIS — G4733 Obstructive sleep apnea (adult) (pediatric): Secondary | ICD-10-CM | POA: Diagnosis not present

## 2022-08-11 DIAGNOSIS — G7000 Myasthenia gravis without (acute) exacerbation: Secondary | ICD-10-CM | POA: Diagnosis not present

## 2022-08-11 DIAGNOSIS — K589 Irritable bowel syndrome without diarrhea: Secondary | ICD-10-CM | POA: Diagnosis not present

## 2022-08-12 ENCOUNTER — Telehealth: Payer: Self-pay | Admitting: Internal Medicine

## 2022-08-13 NOTE — Telephone Encounter (Signed)
I have refilled the tessalon perles.  Please notify her sister that I would like to try to decrease the amount of tessalon perles she is taking.  This is not a continuous medication.

## 2022-08-13 NOTE — Telephone Encounter (Signed)
Okay to refill? Last seen on 05/23/22

## 2022-08-14 ENCOUNTER — Telehealth: Payer: Self-pay | Admitting: *Deleted

## 2022-08-14 ENCOUNTER — Telehealth: Payer: Self-pay | Admitting: Neurology

## 2022-08-14 MED ORDER — DULOXETINE HCL 30 MG PO CPEP: 30.0000 mg | ORAL_CAPSULE | Freq: Every day | ORAL | 2 refills | Status: AC

## 2022-08-14 NOTE — Telephone Encounter (Signed)
Called pt. Informed her of message nurse [facility] sent. Pt said thank you so much.

## 2022-08-14 NOTE — Progress Notes (Signed)
  Care Coordination Note  08/14/2022 Name: Madison Burns MRN: [id number] DOB: 26-Sep-1950  Madison Burns is a 72 y.o. year old female who is a primary care patient of Dale [place], MD and is actively engaged with the care management team. I reached out to [name] by phone today to assist with re-scheduling a follow up visit with the RN Case Manager  Follow up plan: Unsuccessful telephone outreach attempt made. A HIPAA compliant phone message was left for the patient providing contact information and requesting a return call.   Burman Nieves, CCMA Care Coordination Care Guide Direct Dial: [phone]

## 2022-08-14 NOTE — Telephone Encounter (Signed)
Dr Vickey Huger has agreed to take over the cymbalta for the patient. I will refill and send in for the patient

## 2022-08-14 NOTE — Telephone Encounter (Signed)
Pt sister is asking if Dr Vickey Huger will call in pt's DULoxetine (CYMBALTA) 30 MG capsule to [facility].  Sister is asking because they have not been able to make contact with Rickard Patience, MD .  Pt's sister is asking if Dr Vickey Huger will actually consider managing this medication for pt, please call

## 2022-08-14 NOTE — Telephone Encounter (Signed)
Sister called asking if they were going to send in a refill of Cymbalta to try he will drug.  I spoke to the team and they said that the PCP is now taking that over and they already sent it to [facility] from the PCP office

## 2022-08-18 DIAGNOSIS — K219 Gastro-esophageal reflux disease without esophagitis: Secondary | ICD-10-CM | POA: Diagnosis not present

## 2022-08-18 DIAGNOSIS — I10 Essential (primary) hypertension: Secondary | ICD-10-CM | POA: Diagnosis not present

## 2022-08-18 DIAGNOSIS — N3281 Overactive bladder: Secondary | ICD-10-CM | POA: Diagnosis not present

## 2022-08-18 DIAGNOSIS — L89626 Pressure-induced deep tissue damage of left heel: Secondary | ICD-10-CM | POA: Diagnosis not present

## 2022-08-18 DIAGNOSIS — K760 Fatty (change of) liver, not elsewhere classified: Secondary | ICD-10-CM | POA: Diagnosis not present

## 2022-08-18 DIAGNOSIS — Z85238 Personal history of other malignant neoplasm of thymus: Secondary | ICD-10-CM | POA: Diagnosis not present

## 2022-08-18 DIAGNOSIS — E7800 Pure hypercholesterolemia, unspecified: Secondary | ICD-10-CM | POA: Diagnosis not present

## 2022-08-18 DIAGNOSIS — I480 Paroxysmal atrial fibrillation: Secondary | ICD-10-CM | POA: Diagnosis not present

## 2022-08-18 DIAGNOSIS — E039 Hypothyroidism, unspecified: Secondary | ICD-10-CM | POA: Diagnosis not present

## 2022-08-18 DIAGNOSIS — E871 Hypo-osmolality and hyponatremia: Secondary | ICD-10-CM | POA: Diagnosis not present

## 2022-08-18 DIAGNOSIS — G7000 Myasthenia gravis without (acute) exacerbation: Secondary | ICD-10-CM | POA: Diagnosis not present

## 2022-08-18 DIAGNOSIS — K589 Irritable bowel syndrome without diarrhea: Secondary | ICD-10-CM | POA: Diagnosis not present

## 2022-08-18 DIAGNOSIS — R911 Solitary pulmonary nodule: Secondary | ICD-10-CM | POA: Diagnosis not present

## 2022-08-18 DIAGNOSIS — M7981 Nontraumatic hematoma of soft tissue: Secondary | ICD-10-CM | POA: Diagnosis not present

## 2022-08-18 DIAGNOSIS — G4733 Obstructive sleep apnea (adult) (pediatric): Secondary | ICD-10-CM | POA: Diagnosis not present

## 2022-08-18 DIAGNOSIS — Z85828 Personal history of other malignant neoplasm of skin: Secondary | ICD-10-CM | POA: Diagnosis not present

## 2022-08-18 DIAGNOSIS — Z85528 Personal history of other malignant neoplasm of kidney: Secondary | ICD-10-CM | POA: Diagnosis not present

## 2022-08-18 DIAGNOSIS — E869 Volume depletion, unspecified: Secondary | ICD-10-CM | POA: Diagnosis not present

## 2022-08-18 DIAGNOSIS — S62357D Nondisplaced fracture of shaft of fifth metacarpal bone, left hand, subsequent encounter for fracture with routine healing: Secondary | ICD-10-CM | POA: Diagnosis not present

## 2022-08-18 NOTE — Telephone Encounter (Signed)
Left a message on sister Ricarda Frame to return call to the office.

## 2022-08-18 NOTE — Telephone Encounter (Signed)
Pt returned [facility] CMA call. Note below from provider was read to pt sister. She's aware and understood. She said ok.

## 2022-08-22 ENCOUNTER — Ambulatory Visit (INDEPENDENT_AMBULATORY_CARE_PROVIDER_SITE_OTHER): Payer: 59 | Admitting: Internal Medicine

## 2022-08-22 ENCOUNTER — Encounter: Payer: Self-pay | Admitting: Internal Medicine

## 2022-08-22 VITALS — BP 120/70 | HR 89 | Temp 98.0°F | Resp 16 | Ht 64.0 in | Wt 201.0 lb

## 2022-08-22 DIAGNOSIS — I10 Essential (primary) hypertension: Secondary | ICD-10-CM

## 2022-08-22 DIAGNOSIS — S32010D Wedge compression fracture of first lumbar vertebra, subsequent encounter for fracture with routine healing: Secondary | ICD-10-CM

## 2022-08-22 DIAGNOSIS — G7000 Myasthenia gravis without (acute) exacerbation: Secondary | ICD-10-CM

## 2022-08-22 DIAGNOSIS — C649 Malignant neoplasm of unspecified kidney, except renal pelvis: Secondary | ICD-10-CM | POA: Diagnosis not present

## 2022-08-22 DIAGNOSIS — R296 Repeated falls: Secondary | ICD-10-CM

## 2022-08-22 DIAGNOSIS — G4733 Obstructive sleep apnea (adult) (pediatric): Secondary | ICD-10-CM | POA: Diagnosis not present

## 2022-08-22 DIAGNOSIS — D4989 Neoplasm of unspecified behavior of other specified sites: Secondary | ICD-10-CM

## 2022-08-22 DIAGNOSIS — E7800 Pure hypercholesterolemia, unspecified: Secondary | ICD-10-CM

## 2022-08-22 DIAGNOSIS — R109 Unspecified abdominal pain: Secondary | ICD-10-CM | POA: Diagnosis not present

## 2022-08-22 DIAGNOSIS — I480 Paroxysmal atrial fibrillation: Secondary | ICD-10-CM

## 2022-08-22 DIAGNOSIS — D696 Thrombocytopenia, unspecified: Secondary | ICD-10-CM

## 2022-08-22 DIAGNOSIS — E039 Hypothyroidism, unspecified: Secondary | ICD-10-CM

## 2022-08-22 DIAGNOSIS — C642 Malignant neoplasm of left kidney, except renal pelvis: Secondary | ICD-10-CM | POA: Diagnosis not present

## 2022-08-22 DIAGNOSIS — F39 Unspecified mood [affective] disorder: Secondary | ICD-10-CM

## 2022-08-22 DIAGNOSIS — G40909 Epilepsy, unspecified, not intractable, without status epilepticus: Secondary | ICD-10-CM

## 2022-08-22 LAB — CBC WITH DIFFERENTIAL/PLATELET
Basophils Relative: 0.2 %
MCH: 30.5 pg (ref 27.0–33.0)
Neutrophils Relative %: 82.6 %
RDW: 18.5 % — ABNORMAL HIGH (ref 11.0–15.0)
Total Lymphocyte: 10.5 %

## 2022-08-22 NOTE — Progress Notes (Unsigned)
Subjective:    Patient ID: Madison Burns, female    DOB: 09-26-1950, 72 y.o.   MRN: [id number]  Patient here for  Chief Complaint  Patient presents with   Medical Management of Chronic Issues    HPI Here for a scheduled follow up. She is accompanied by her sister. History obtained from both of them. Has been followed by [facility] - f/u lumbar fracture (wedge compression fracture first lumbar vertebra) - s/p previous fall. No increased back pain reported today.  Sees cardiology for f/u paroxysmal afib.  Remains on amiodarone and diltiazem.  No anticoagulation due to frequent falls.  History of OSA  - cpap. Sister with increased stress - with her own medical issues and with trying to help care for her sister.  Discussed home health.  Needs an aid to help bather [name] and help with ADLs.  Breathing stable.  No increased cough or congestion.  Reported some lower abdominal discomfort - with palpation.  Sister reports no mention of increased abdominal pain.     Past Medical History:  Diagnosis Date   Cancer of kidney (HCC) 2021   Complication of anesthesia    Myasthenia gravis   GERD (gastroesophageal reflux disease)    History of seizure disorder    Hypercholesterolemia    Hypertension    no longer has it. wt loss   Hypokalemia 04/10/2022   Hyponatremia 04/10/2022   Hypothyroidism    Irritable bowel syndrome    Lactic acidosis 04/10/2022   Melanoma (HCC) 2017   Melanoma on neck   Myasthenia gravis (HCC) 2011   OSA on CPAP    Thymoma, malignant (HCC)    Past Surgical History:  Procedure Laterality Date   ABDOMINAL HYSTERECTOMY  1990   APPENDECTOMY  1999   CHOLECYSTECTOMY  2013   COLONOSCOPY WITH PROPOFOL N/A 06/30/2017   Procedure: COLONOSCOPY WITH PROPOFOL;  Surgeon: Toledo, Boykin Nearing, MD;  Location: [facility] ENDOSCOPY;  Service: Gastroenterology;  Laterality: N/A;   CYSTOSCOPY W/ URETERAL STENT PLACEMENT Left 08/17/2019   Procedure: CYSTOSCOPY WITH RETROGRADE PYELOGRAM/URETERAL STENT  PLACEMENT;  Surgeon: Sebastian Ache, MD;  Location: [facility];  Service: Urology;  Laterality: Left;  30 MINS   ESOPHAGOGASTRODUODENOSCOPY (EGD) WITH PROPOFOL N/A 06/30/2017   Procedure: ESOPHAGOGASTRODUODENOSCOPY (EGD) WITH PROPOFOL;  Surgeon: Toledo, Boykin Nearing, MD;  Location: [facility] ENDOSCOPY;  Service: Gastroenterology;  Laterality: N/A;   IR RADIOLOGIST EVAL & MGMT  06/28/2019   IR RADIOLOGIST EVAL & MGMT  09/14/2019   IR RADIOLOGIST EVAL & MGMT  03/28/2020   IR RADIOLOGIST EVAL & MGMT  10/10/2020   IR RADIOLOGIST EVAL & MGMT  11/22/2021   RADIOFREQUENCY ABLATION Left 08/17/2019   Procedure: LEFT RENAL CRYO ABLATION;  Surgeon: Malachy Moan, MD;  Location: [facility];  Service: Anesthesiology;  Laterality: Left;   TONSILLECTOMY  1959   Family History  Problem Relation Age of Onset   Colon cancer Maternal Grandfather        stomach/colon   Heart disease Father        myocardial infarction   Hyperlipidemia Father    Hyperlipidemia Sister    Diabetes Sister    Bone cancer Other        cousin   Alzheimer's disease Mother        maternal aunts   Skin cancer Mother    Myasthenia gravis Brother        ocular   Skin cancer Brother    Stroke Paternal Grandfather    Breast cancer Neg  Hx    Social History   Socioeconomic History   Marital status: Single    Spouse name: Not on file   Number of children: 0   Years of education: Special Ed   Highest education level: Not on file  Occupational History   Occupation: Unemployed  Tobacco Use   Smoking status: Never   Smokeless tobacco: Never  Vaping Use   Vaping Use: Never used  Substance and Sexual Activity   Alcohol use: No    Alcohol/week: 0.0 standard drinks of alcohol   Drug use: No   Sexual activity: Not Currently  Other Topics Concern   Not on file  Social History Narrative   06/23/17 lives with sister, Harriett Sine   Regular exercise-no   Caffeine Use-yes   Social Determinants of Health   Financial Resource Strain: Low Risk   (05/30/2022)   Overall Financial Resource Strain (CARDIA)    Difficulty of Paying Living Expenses: Not hard at all  Food Insecurity: No Food Insecurity (05/30/2022)   Hunger Vital Sign    Worried About Running Out of Food in the Last Year: Never true    Ran Out of Food in the Last Year: Never true  Transportation Needs: No Transportation Needs (05/30/2022)   PRAPARE - Administrator, Civil Service (Medical): No    Lack of Transportation (Non-Medical): No  Physical Activity: Insufficiently Active (05/30/2022)   Exercise Vital Sign    Days of Exercise per Week: 5 days    Minutes of Exercise per Session: 20 min  Stress: No Stress Concern Present (05/30/2022)   [employer] of Occupational Health - Occupational Stress Questionnaire    Feeling of Stress : Not at all  Social Connections: Unknown (05/30/2022)   Social Connection and Isolation Panel [NHANES]    Frequency of Communication with Friends and Family: Not on file    Frequency of Social Gatherings with Friends and Family: More than three times a week    Attends Religious Services: Not on file    Active Member of Clubs or Organizations: Not on file    Attends Banker Meetings: Not on file    Marital Status: Not on file     Review of Systems  Constitutional:  Negative for appetite change and unexpected weight change.  HENT:  Negative for congestion and sinus pressure.   Respiratory:  Negative for cough, chest tightness and shortness of breath.   Cardiovascular:  Negative for chest pain and palpitations.       No increased swelling.  Stable.   Gastrointestinal:  Negative for abdominal pain, diarrhea, nausea and vomiting.  Genitourinary:  Negative for difficulty urinating and dysuria.  Musculoskeletal:  Negative for joint swelling and myalgias.  Skin:  Negative for color change and rash.  Neurological:  Negative for dizziness and headaches.  Psychiatric/Behavioral:  Negative for agitation and dysphoric  mood.        Objective:     BP 120/70   Pulse 89   Temp 98 F (36.7 C)   Resp 16   Ht 5\' 4"  (1.626 m)   Wt 201 lb (91.2 kg)   SpO2 98%   BMI 34.50 kg/m  Wt Readings from Last 3 Encounters:  08/22/22 201 lb (91.2 kg)  07/09/22 214 lb 9.6 oz (97.3 kg)  05/30/22 225 lb (102.1 kg)    Physical Exam Vitals reviewed.  Constitutional:      General: She is not in acute distress.    Appearance: Normal appearance.  HENT:     Head: Normocephalic and atraumatic.     Right Ear: External ear normal.     Left Ear: External ear normal.  Eyes:     General: No scleral icterus.       Right eye: No discharge.        Left eye: No discharge.     Conjunctiva/sclera: Conjunctivae normal.  Neck:     Thyroid: No thyromegaly.  Cardiovascular:     Rate and Rhythm: Normal rate and regular rhythm.  Pulmonary:     Effort: No respiratory distress.     Breath sounds: Normal breath sounds. No wheezing.  Abdominal:     General: Bowel sounds are normal.     Palpations: Abdomen is soft.     Tenderness: There is no abdominal tenderness.  Musculoskeletal:        General: No swelling or tenderness.     Cervical back: Neck supple. No tenderness.  Lymphadenopathy:     Cervical: No cervical adenopathy.  Skin:    Findings: No erythema or rash.  Neurological:     Mental Status: She is alert.  Psychiatric:        Mood and Affect: Mood normal.        Behavior: Behavior normal.      Outpatient Encounter Medications as of 08/22/2022  Medication Sig   acetaminophen (TYLENOL) 325 MG tablet Take 2 tablets (650 mg total) by mouth every 6 (six) hours as needed for mild pain (or Fever >/= 101).   amiodarone (PACERONE) 200 MG tablet Take 1 tablet (200 mg total) by mouth daily.   Ascorbic Acid (VITAMIN C) 1000 MG tablet Take 1,000 mg by mouth at bedtime.   B Complex-C (B-COMPLEX WITH VITAMIN C) tablet Take 1 tablet by mouth every evening.   benzonatate (TESSALON) 100 MG capsule TAKE 1 CAPSULE BY MOUTH  TWICE DAILY AS NEEDED FOR COUGH   Biotin 09811 MCG TABS Take 10,000 mcg by mouth daily at 2 PM. (1430)   Calcium-Phosphorus-Vitamin D (CITRACAL +D3 PO) Take 1 tablet by mouth daily.   desmopressin (DDAVP NASAL) 0.01 % solution Place 1 spray (10 mcg total) into the nose 2 (two) times daily.   diltiazem (CARDIZEM CD) 180 MG 24 hr capsule Take 180 mg by mouth daily.   DULoxetine (CYMBALTA) 30 MG capsule Take 1 capsule (30 mg total) by mouth daily.   folic acid (FOLVITE) 1 MG tablet TAKE 2 TABLETS BY MOUTH ONCE DAILY   Garlic 1000 MG CAPS Take 1,000 mg by mouth in the morning, at noon, and at bedtime. (0800, 1430, 2000)   levothyroxine (SYNTHROID) 75 MCG tablet TAKE 1 TABLET BY MOUTH ONCE DAILY ON AN EMPTY STOMACH. WAIT 30 MINUTES BEFORE TAKING OTHER MEDS.   magnesium hydroxide (MILK OF MAGNESIA) 400 MG/5ML suspension Take 30 mLs by mouth daily as needed for mild constipation.   methotrexate (RHEUMATREX) 2.5 MG tablet TAKE 4 TABLETS BY MOUTH ONCE A WEEK WITHFOLIC ACID   pantoprazole (PROTONIX) 40 MG tablet Take 40 mg by mouth daily.    Probiotic Product (PROBIOTIC MULTI-ENZYME PO) Take 1 capsule by mouth daily. Probiotic Multi-Enzyme Digestive Formula   pyridostigmine (MESTINON) 60 MG tablet TAKE 1 TABLET BY MOUTH 3 TIMES DAILY   risperiDONE (RISPERDAL) 1 MG tablet Take 1 mg by mouth at bedtime.    tolterodine (DETROL LA) 4 MG 24 hr capsule TAKE 1 CAPSULE BY MOUTH ONCE EVERY MORNING   vitamin B-12 (CYANOCOBALAMIN) 250 MCG tablet Take 250 mcg by mouth  in the morning, at noon, and at bedtime.   vitamin E 180 MG (400 UNITS) capsule Take 400 Units by mouth daily at 2 PM. 1430   [DISCONTINUED] benzonatate (TESSALON) 100 MG capsule TAKE 1 CAPSULE BY MOUTH TWICE DAILY AS NEEDED FOR COUGH   [DISCONTINUED] DULoxetine (CYMBALTA) 30 MG capsule TAKE 1 CAPSULE BY MOUTH ONCE DAILY   No facility-administered encounter medications on file as of 08/22/2022.     Lab Results  Component Value Date   WBC 8.8  08/22/2022   HGB 13.9 08/22/2022   HCT 42.4 08/22/2022   PLT 250 08/22/2022   GLUCOSE 113 (H) 08/22/2022   CHOL 238 (H) 01/08/2022   TRIG 129.0 01/08/2022   HDL 60.60 01/08/2022   LDLDIRECT 122.0 12/14/2018   LDLCALC 152 (H) 01/08/2022   ALT 21 08/22/2022   AST 27 08/22/2022   NA 138 08/22/2022   K 3.7 08/22/2022   CL 102 08/22/2022   CREATININE 0.92 08/22/2022   BUN 14 08/22/2022   CO2 25 08/22/2022   TSH 4.346 04/01/2022   INR 1.1 04/02/2022   HGBA1C 5.6 01/08/2022    MM 3D SCREENING MAMMOGRAM BILATERAL BREAST  Result Date: 08/01/2022 CLINICAL DATA:  Screening. EXAM: DIGITAL SCREENING BILATERAL MAMMOGRAM WITH TOMOSYNTHESIS AND CAD TECHNIQUE: Bilateral screening digital craniocaudal and mediolateral oblique mammograms were obtained. Bilateral screening digital breast tomosynthesis was performed. The images were evaluated with computer-aided detection. COMPARISON:  Previous exam(s). ACR Breast Density Category b: There are scattered areas of fibroglandular density. FINDINGS: There are no findings suspicious for malignancy. IMPRESSION: No mammographic evidence of malignancy. A result letter of this screening mammogram will be mailed directly to the patient. RECOMMENDATION: Screening mammogram in one year. (Code:SM-B-01Y) BI-RADS CATEGORY  1: Negative. Electronically Signed   By: Baird Lyons M.D.   On: 08/01/2022 16:03   DG Bone Density  Result Date: 07/31/2022 EXAM: DUAL X-RAY ABSORPTIOMETRY (DXA) FOR BONE MINERAL DENSITY IMPRESSION: Dear Dr. Lorin Picket, Your patient Myriam Trimbur Horne completed a FRAX assessment on 07/31/2022 using the Lunar iDXA DXA System (analysis version: 14.10) manufactured by [employer]. The following summarizes the results of our evaluation. PATIENT BIOGRAPHICAL: Name: Kanyah, Dory Patient ID: [id number] Birth Date: September 22, 1950 Height:    64.0 in. Gender:     Female    [age]       Weight:    214.6 lbs. Ethnicity:  White                            Exam  Date: 07/31/2022 FRAX* RESULTS:  (version: 3.5) 10-year Probability of Fracture1 Major Osteoporotic Fracture2 Hip Fracture 12.3% 2.7% Population: [place] (Caucasian) Risk Factors: None Based on Femur (Left) Neck BMD 1 -The 10-year probability of fracture may be lower than reported if the patient has received treatment. 2 -Major Osteoporotic Fracture: Clinical Spine, Forearm, Hip or Shoulder *FRAX is a Armed forces logistics/support/administrative officer of the [employer] of [employer] for Metabolic Bone Disease, a World Science writer (WHO) [employer]. ASSESSMENT: The probability of a major osteoporotic fracture is 12.3% within the next ten years. The probability of a hip fracture is 2.7% within the next ten years. . Your patient Alescia Rowbotham completed a BMD test on 07/31/2022 using the [employer] iDXA DXA System (software version: 14.10) manufactured by [employer]. The following summarizes the results of our evaluation. Technologist: [facility] PATIENT BIOGRAPHICAL: Name: Phenyx, Matsushita Patient ID: [id number] Birth  Date: May 20, 1950 Height: 64.0 in. Gender: Female Exam Date: 07/31/2022 Weight: 214.6 lbs. Indications: Advanced Age, Caucasian, Hypothyroid, Hysterectomy, Oophorectomy Bilateral, Postmenopausal Fractures: Treatments: Calcium, Levothyroxine, Vitamin D DENSITOMETRY RESULTS: Site         Region     Measured Date Measured Age WHO Classification Young Adult T-score BMD         %Change vs. Previous Significant Change (*) DualFemur Neck Left 07/31/2023 71.1 Osteopenia -2.2 0.729 g/cm2 Left Forearm Radius 33% 07/31/2022 71.1 Normal -0.8 0.808 g/cm2 ASSESSMENT: The BMD measured at Femur Neck Left is 0.729 g/cm2 with a T-score of -2.2. This patient is considered osteopenic according to World Health Organization [facility] criteria. The scan quality is good. Lumbar spine was not utilized due to advanced degenerative changes. World Science writer [facility]) criteria for post-menopausal, Caucasian Women: Normal:                    T-score at or above -1 SD Osteopenia/low bone mass: T-score between -1 and -2.5 SD Osteoporosis:             T-score at or below -2.5 SD RECOMMENDATIONS: 1. All patients should optimize calcium and vitamin D intake. 2. Consider FDA-approved medical therapies in postmenopausal women and men aged 59 years and older, based on the following: a. A hip or vertebral(clinical or morphometric) fracture b. T-score < -2.5 at the femoral neck or spine after appropriate evaluation to exclude secondary causes c. Low bone mass (T-score between -1.0 and -2.5 at the femoral neck or spine) and a 10-year probability of a hip fracture > 3% or a 10-year probability of a major osteoporosis-related fracture > 20% based on the [place]-adapted WHO algorithm 3. Clinician judgment and/or patient preferences may indicate treatment for people with 10-year fracture probabilities above or below these levels FOLLOW-UP: People with diagnosed cases of osteoporosis or at high risk for fracture should have regular bone mineral density tests. For patients eligible for Medicare, routine testing is allowed once every 2 years. The testing frequency can be increased to one year for patients who have rapidly progressing disease, those who are receiving or discontinuing medical therapy to restore bone mass, or have additional risk factors. I have reviewed this report, and agree with the above findings. [facility] Electronically Signed   By: Bary Richard M.D.   On: 07/31/2022 11:19       Assessment & Plan:  Abdominal pain, unspecified abdominal location -     CBC with Differential/Platelet -     Hepatic function panel -     Basic metabolic panel  Primary hypertension Assessment & Plan: Blood pressure has been doing well. On oral cardizem.  Follow pressures and metabolic panel.    Renal cell cancer, left [facility]) Assessment & Plan:  Renal cell cancer - f/u Dr Apolinar Junes 05/28/22  - Lesion appears stable, post ablative changes without  recurrence - Plan to continue annual surveillance with MRI of the abdomen with contrast for a total of five years post-treatment, acknowledging this is year three.   Return in about 1 year (around 05/28/2023) for repeat MRI abdomen.   Thymoma Assessment & Plan: Followed by oncology.  S/p chemo and XRT.     Thrombocytopenia (HCC) Assessment & Plan: Followed by oncology.    Seizure disorder [facility]) Assessment & Plan: Followed by neurology.  Stable.    Paroxysmal atrial fibrillation (HCC) Assessment & Plan: Continues on amiodarone and oral diltiazem.  Hold on further anticoagulation given recent falls.  Follow.  Appears to be in SR today on exam.    OSA on CPAP Assessment & Plan: Followed by neurology.  Continue cpap.    Myasthenia gravis [facility]) Assessment & Plan: Neurology 12/2021 - continue mestinon, methotrexate, folic acid and DDVAP.    Mood disorder [facility]) Assessment & Plan: Psychiatry.  Continue risperdal.     Acquired hypothyroidism Assessment & Plan: On thyroid replacement.  Follow tsh.    Hypercholesterolemia Assessment & Plan: Has declined cholesterol medication.  Low cholesterol diet and exercise.  Follow lipid panel.    Frequent falls Assessment & Plan: Recent falls as outlined.  Continue home PT, OT.    Closed compression fracture of L1 lumbar vertebra with routine healing, subsequent encounter Assessment & Plan: Followed by [facility].  Stable.    Anemia due to chemotherapy for renal cell cancer treated with erythropoietin [facility]) Assessment & Plan: Followed by oncology.       Dale [place], MD

## 2022-08-22 NOTE — Patient Instructions (Signed)
Benefiber - daily 

## 2022-08-23 LAB — HEPATIC FUNCTION PANEL
AG Ratio: 1.9 (calc) (ref 1.0–2.5)
ALT: 21 U/L (ref 6–29)
AST: 27 U/L (ref 10–35)
Albumin: 3.8 g/dL (ref 3.6–5.1)
Alkaline phosphatase (APISO): 106 U/L (ref 37–153)
Bilirubin, Direct: 0.1 mg/dL (ref 0.0–0.2)
Globulin: 2 g/dL (calc) (ref 1.9–3.7)
Indirect Bilirubin: 0.5 mg/dL (calc) (ref 0.2–1.2)
Total Bilirubin: 0.6 mg/dL (ref 0.2–1.2)
Total Protein: 5.8 g/dL — ABNORMAL LOW (ref 6.1–8.1)

## 2022-08-23 LAB — CBC WITH DIFFERENTIAL/PLATELET
Absolute Monocytes: 537 cells/uL (ref 200–950)
Basophils Absolute: 18 cells/uL (ref 0–200)
Eosinophils Absolute: 53 cells/uL (ref 15–500)
Eosinophils Relative: 0.6 %
HCT: 42.4 % (ref 35.0–45.0)
Hemoglobin: 13.9 g/dL (ref 11.7–15.5)
Lymphs Abs: 924 cells/uL (ref 850–3900)
MCHC: 32.8 g/dL (ref 32.0–36.0)
MCV: 93 fL (ref 80.0–100.0)
MPV: 8.6 fL (ref 7.5–12.5)
Monocytes Relative: 6.1 %
Neutro Abs: 7269 cells/uL (ref 1500–7800)
Platelets: 250 10*3/uL (ref 140–400)
RBC: 4.56 10*6/uL (ref 3.80–5.10)
WBC: 8.8 10*3/uL (ref 3.8–10.8)

## 2022-08-23 LAB — BASIC METABOLIC PANEL
BUN: 14 mg/dL (ref 7–25)
CO2: 25 mmol/L (ref 20–32)
Calcium: 9.1 mg/dL (ref 8.6–10.4)
Chloride: 102 mmol/L (ref 98–110)
Creat: 0.92 mg/dL (ref 0.60–1.00)
Glucose, Bld: 113 mg/dL — ABNORMAL HIGH (ref 65–99)
Potassium: 3.7 mmol/L (ref 3.5–5.3)
Sodium: 138 mmol/L (ref 135–146)

## 2022-08-24 ENCOUNTER — Encounter: Payer: Self-pay | Admitting: Internal Medicine

## 2022-08-24 NOTE — Assessment & Plan Note (Signed)
Psychiatry.  Continue risperdal.   

## 2022-08-24 NOTE — Assessment & Plan Note (Signed)
Followed by oncology 

## 2022-08-24 NOTE — Assessment & Plan Note (Signed)
On thyroid replacement.  Follow tsh.  

## 2022-08-24 NOTE — Assessment & Plan Note (Signed)
Continues on amiodarone and oral diltiazem.  Hold on further anticoagulation given recent falls.  Follow.  Appears to be in SR today on exam.

## 2022-08-24 NOTE — Assessment & Plan Note (Addendum)
Has declined cholesterol medication.  Low cholesterol diet and exercise.  Follow lipid panel.   

## 2022-08-24 NOTE — Assessment & Plan Note (Signed)
Neurology 12/2021 - continue mestinon, methotrexate, folic acid and DDVAP.  

## 2022-08-24 NOTE — Assessment & Plan Note (Signed)
Followed by [facility].  Stable.

## 2022-08-24 NOTE — Assessment & Plan Note (Signed)
Recent falls as outlined.  Continue home PT, OT.

## 2022-08-24 NOTE — Assessment & Plan Note (Signed)
Followed by neurology.  Continue cpap.

## 2022-08-24 NOTE — Assessment & Plan Note (Signed)
Followed by oncology.  S/p chemo and XRT.   °

## 2022-08-24 NOTE — Assessment & Plan Note (Signed)
Blood pressure has been doing well. On oral cardizem.  Follow pressures and metabolic panel.  

## 2022-08-24 NOTE — Assessment & Plan Note (Signed)
Renal cell cancer - f/u Dr Apolinar Junes 05/28/22  - Lesion appears stable, post ablative changes without recurrence - Plan to continue annual surveillance with MRI of the abdomen with contrast for a total of five years post-treatment, acknowledging this is year three.   Return in about 1 year (around 05/28/2023) for repeat MRI abdomen.

## 2022-08-24 NOTE — Assessment & Plan Note (Signed)
Followed by neurology.  Stable  

## 2022-08-25 DIAGNOSIS — I480 Paroxysmal atrial fibrillation: Secondary | ICD-10-CM | POA: Diagnosis not present

## 2022-08-25 DIAGNOSIS — E871 Hypo-osmolality and hyponatremia: Secondary | ICD-10-CM | POA: Diagnosis not present

## 2022-08-25 DIAGNOSIS — I10 Essential (primary) hypertension: Secondary | ICD-10-CM | POA: Diagnosis not present

## 2022-08-25 DIAGNOSIS — M7981 Nontraumatic hematoma of soft tissue: Secondary | ICD-10-CM | POA: Diagnosis not present

## 2022-08-25 DIAGNOSIS — K589 Irritable bowel syndrome without diarrhea: Secondary | ICD-10-CM | POA: Diagnosis not present

## 2022-08-25 DIAGNOSIS — E7800 Pure hypercholesterolemia, unspecified: Secondary | ICD-10-CM | POA: Diagnosis not present

## 2022-08-25 DIAGNOSIS — N3281 Overactive bladder: Secondary | ICD-10-CM | POA: Diagnosis not present

## 2022-08-25 DIAGNOSIS — G7000 Myasthenia gravis without (acute) exacerbation: Secondary | ICD-10-CM | POA: Diagnosis not present

## 2022-08-25 DIAGNOSIS — S62357D Nondisplaced fracture of shaft of fifth metacarpal bone, left hand, subsequent encounter for fracture with routine healing: Secondary | ICD-10-CM | POA: Diagnosis not present

## 2022-08-25 DIAGNOSIS — Z85238 Personal history of other malignant neoplasm of thymus: Secondary | ICD-10-CM | POA: Diagnosis not present

## 2022-08-25 DIAGNOSIS — K219 Gastro-esophageal reflux disease without esophagitis: Secondary | ICD-10-CM | POA: Diagnosis not present

## 2022-08-25 DIAGNOSIS — G4733 Obstructive sleep apnea (adult) (pediatric): Secondary | ICD-10-CM | POA: Diagnosis not present

## 2022-08-25 DIAGNOSIS — R911 Solitary pulmonary nodule: Secondary | ICD-10-CM | POA: Diagnosis not present

## 2022-08-25 DIAGNOSIS — L89626 Pressure-induced deep tissue damage of left heel: Secondary | ICD-10-CM | POA: Diagnosis not present

## 2022-08-25 DIAGNOSIS — E039 Hypothyroidism, unspecified: Secondary | ICD-10-CM | POA: Diagnosis not present

## 2022-08-25 DIAGNOSIS — E869 Volume depletion, unspecified: Secondary | ICD-10-CM | POA: Diagnosis not present

## 2022-08-25 DIAGNOSIS — Z85528 Personal history of other malignant neoplasm of kidney: Secondary | ICD-10-CM | POA: Diagnosis not present

## 2022-08-25 DIAGNOSIS — Z85828 Personal history of other malignant neoplasm of skin: Secondary | ICD-10-CM | POA: Diagnosis not present

## 2022-08-25 DIAGNOSIS — K760 Fatty (change of) liver, not elsewhere classified: Secondary | ICD-10-CM | POA: Diagnosis not present

## 2022-08-27 DIAGNOSIS — I480 Paroxysmal atrial fibrillation: Secondary | ICD-10-CM | POA: Diagnosis not present

## 2022-08-27 DIAGNOSIS — N3281 Overactive bladder: Secondary | ICD-10-CM | POA: Diagnosis not present

## 2022-08-27 DIAGNOSIS — E039 Hypothyroidism, unspecified: Secondary | ICD-10-CM | POA: Diagnosis not present

## 2022-08-27 DIAGNOSIS — L89626 Pressure-induced deep tissue damage of left heel: Secondary | ICD-10-CM | POA: Diagnosis not present

## 2022-08-27 DIAGNOSIS — Z85238 Personal history of other malignant neoplasm of thymus: Secondary | ICD-10-CM | POA: Diagnosis not present

## 2022-08-27 DIAGNOSIS — K760 Fatty (change of) liver, not elsewhere classified: Secondary | ICD-10-CM | POA: Diagnosis not present

## 2022-08-27 DIAGNOSIS — I10 Essential (primary) hypertension: Secondary | ICD-10-CM | POA: Diagnosis not present

## 2022-08-27 DIAGNOSIS — Z85528 Personal history of other malignant neoplasm of kidney: Secondary | ICD-10-CM | POA: Diagnosis not present

## 2022-08-27 DIAGNOSIS — K589 Irritable bowel syndrome without diarrhea: Secondary | ICD-10-CM | POA: Diagnosis not present

## 2022-08-27 DIAGNOSIS — E7800 Pure hypercholesterolemia, unspecified: Secondary | ICD-10-CM | POA: Diagnosis not present

## 2022-08-27 DIAGNOSIS — G7000 Myasthenia gravis without (acute) exacerbation: Secondary | ICD-10-CM | POA: Diagnosis not present

## 2022-08-27 DIAGNOSIS — K219 Gastro-esophageal reflux disease without esophagitis: Secondary | ICD-10-CM | POA: Diagnosis not present

## 2022-08-27 DIAGNOSIS — E871 Hypo-osmolality and hyponatremia: Secondary | ICD-10-CM | POA: Diagnosis not present

## 2022-08-27 DIAGNOSIS — R911 Solitary pulmonary nodule: Secondary | ICD-10-CM | POA: Diagnosis not present

## 2022-08-27 DIAGNOSIS — Z85828 Personal history of other malignant neoplasm of skin: Secondary | ICD-10-CM | POA: Diagnosis not present

## 2022-08-27 DIAGNOSIS — E869 Volume depletion, unspecified: Secondary | ICD-10-CM | POA: Diagnosis not present

## 2022-08-27 DIAGNOSIS — M7981 Nontraumatic hematoma of soft tissue: Secondary | ICD-10-CM | POA: Diagnosis not present

## 2022-08-27 DIAGNOSIS — G4733 Obstructive sleep apnea (adult) (pediatric): Secondary | ICD-10-CM | POA: Diagnosis not present

## 2022-08-27 DIAGNOSIS — S62357D Nondisplaced fracture of shaft of fifth metacarpal bone, left hand, subsequent encounter for fracture with routine healing: Secondary | ICD-10-CM | POA: Diagnosis not present

## 2022-08-28 ENCOUNTER — Ambulatory Visit: Payer: 59 | Admitting: Podiatry

## 2022-09-01 ENCOUNTER — Telehealth: Payer: Self-pay | Admitting: Internal Medicine

## 2022-09-01 NOTE — Telephone Encounter (Signed)
Pt sister called stating pt need an FL2 form filled out for just a week while the sister is having surgery

## 2022-09-01 NOTE — Telephone Encounter (Signed)
Yes

## 2022-09-01 NOTE — Telephone Encounter (Signed)
Patient is going to stay at [facility] in [place] for a week while her sister has surgery. Surgery is next Tuesday. FL-2 required. Ok to complete FL-2 for her?

## 2022-09-02 NOTE — Progress Notes (Signed)
  Care Coordination Note  09/02/2022 Name: Madison Burns MRN: [id number] DOB: Oct 06, 1950  Madison Burns is a 72 y.o. year old female who is a primary care patient of Dale Kiel, MD and is actively engaged with the care management team. I reached out to [name] by phone today to assist with re-scheduling a follow up visit with the RN Case Manager  Follow up plan: We have been unable to make contact with the patient for follow up.   Burman Nieves, CCMA Care Coordination Care Guide Direct Dial: [phone]

## 2022-09-02 NOTE — Telephone Encounter (Signed)
FL2 has been completed and med list attached. Placed out for signature. Sister is aware that you are out of office and will sign once you return.

## 2022-09-03 ENCOUNTER — Other Ambulatory Visit: Payer: Self-pay | Admitting: Neurology

## 2022-09-03 DIAGNOSIS — G40309 Generalized idiopathic epilepsy and epileptic syndromes, not intractable, without status epilepticus: Secondary | ICD-10-CM

## 2022-09-03 DIAGNOSIS — F73 Profound intellectual disabilities: Secondary | ICD-10-CM

## 2022-09-03 DIAGNOSIS — G4733 Obstructive sleep apnea (adult) (pediatric): Secondary | ICD-10-CM

## 2022-09-03 NOTE — Telephone Encounter (Signed)
 Signed and placed in box.   

## 2022-09-04 NOTE — Telephone Encounter (Signed)
Called and LM with Tammy at [facility] admissions to call back regarding FL2. If sister calls to follow up on this, let her know that FL2 is completed- just waiting to hear back from facility

## 2022-09-05 NOTE — Telephone Encounter (Signed)
Spoke with Inetta Fermo at [facility] to follow up on FL2. Systems have been down all day. Emailed FL2 to secure email and provided her with patients sister Nancy's phone number to follow up with her about patient coming to [facility] next week. I also left Harriett Sine a message myself to follow up with her and let her know that everything had been sent from our office that was needed.

## 2022-09-05 NOTE — Telephone Encounter (Signed)
Spoke with Tammy at [facility]. Faxed FL2. Per Tammy, care was not confirmed for this patient 7/22-7/29. Family member just reached out about her coming in general. Advised that FL2 had been completed and would be sent. They are going to try to coordinate with patients sister. Will let us know if anything further is needed

## 2022-09-15 ENCOUNTER — Encounter: Payer: Self-pay | Admitting: Emergency Medicine

## 2022-09-15 ENCOUNTER — Other Ambulatory Visit: Payer: Self-pay

## 2022-09-15 ENCOUNTER — Emergency Department: Payer: 59

## 2022-09-15 ENCOUNTER — Emergency Department
Admission: EM | Admit: 2022-09-15 | Discharge: 2022-09-15 | Disposition: A | Discharge status: Home / Self Care | Payer: 59 | Attending: Emergency Medicine | Admitting: Emergency Medicine

## 2022-09-15 DIAGNOSIS — K5730 Diverticulosis of large intestine without perforation or abscess without bleeding: Secondary | ICD-10-CM | POA: Diagnosis not present

## 2022-09-15 DIAGNOSIS — Z743 Need for continuous supervision: Secondary | ICD-10-CM | POA: Diagnosis not present

## 2022-09-15 DIAGNOSIS — N3090 Cystitis, unspecified without hematuria: Secondary | ICD-10-CM | POA: Diagnosis not present

## 2022-09-15 DIAGNOSIS — R059 Cough, unspecified: Secondary | ICD-10-CM | POA: Insufficient documentation

## 2022-09-15 DIAGNOSIS — R109 Unspecified abdominal pain: Secondary | ICD-10-CM | POA: Diagnosis not present

## 2022-09-15 DIAGNOSIS — R079 Chest pain, unspecified: Secondary | ICD-10-CM | POA: Diagnosis not present

## 2022-09-15 DIAGNOSIS — R918 Other nonspecific abnormal finding of lung field: Secondary | ICD-10-CM | POA: Diagnosis not present

## 2022-09-15 DIAGNOSIS — R1013 Epigastric pain: Secondary | ICD-10-CM | POA: Diagnosis not present

## 2022-09-15 DIAGNOSIS — R0989 Other specified symptoms and signs involving the circulatory and respiratory systems: Secondary | ICD-10-CM | POA: Diagnosis not present

## 2022-09-15 DIAGNOSIS — Z1152 Encounter for screening for COVID-19: Secondary | ICD-10-CM | POA: Insufficient documentation

## 2022-09-15 DIAGNOSIS — R0789 Other chest pain: Secondary | ICD-10-CM | POA: Diagnosis not present

## 2022-09-15 LAB — CBC WITH DIFFERENTIAL/PLATELET
Abs Immature Granulocytes: 0.06 10*3/uL (ref 0.00–0.07)
Basophils Absolute: 0 10*3/uL (ref 0.0–0.1)
Basophils Relative: 0 %
Eosinophils Absolute: 0.1 10*3/uL (ref 0.0–0.5)
Eosinophils Relative: 1 %
HCT: 40.6 % (ref 36.0–46.0)
Hemoglobin: 14.1 g/dL (ref 12.0–15.0)
Immature Granulocytes: 1 %
Lymphocytes Relative: 12 %
Lymphs Abs: 1 10*3/uL (ref 0.7–4.0)
MCH: 32 pg (ref 26.0–34.0)
MCHC: 34.7 g/dL (ref 30.0–36.0)
MCV: 92.3 fL (ref 80.0–100.0)
Monocytes Absolute: 0.7 10*3/uL (ref 0.1–1.0)
Monocytes Relative: 7 %
Neutro Abs: 7.1 10*3/uL (ref 1.7–7.7)
Neutrophils Relative %: 79 %
Platelets: 204 10*3/uL (ref 150–400)
RBC: 4.4 MIL/uL (ref 3.87–5.11)
RDW: 17.4 % — ABNORMAL HIGH (ref 11.5–15.5)
WBC: 9 10*3/uL (ref 4.0–10.5)
nRBC: 0 % (ref 0.0–0.2)

## 2022-09-15 LAB — COMPREHENSIVE METABOLIC PANEL
ALT: 23 U/L (ref 0–44)
AST: 27 U/L (ref 15–41)
Albumin: 3.2 g/dL — ABNORMAL LOW (ref 3.5–5.0)
Alkaline Phosphatase: 80 U/L (ref 38–126)
Anion gap: 10 (ref 5–15)
BUN: 9 mg/dL (ref 8–23)
CO2: 27 mmol/L (ref 22–32)
Calcium: 8.4 mg/dL — ABNORMAL LOW (ref 8.9–10.3)
Chloride: 95 mmol/L — ABNORMAL LOW (ref 98–111)
Creatinine, Ser: 0.93 mg/dL (ref 0.44–1.00)
GFR, Estimated: >60 mL/min (ref >60)
Glucose, Bld: 102 mg/dL — ABNORMAL HIGH (ref 70–99)
Potassium: 3.3 mmol/L — ABNORMAL LOW (ref 3.5–5.1)
Sodium: 132 mmol/L — ABNORMAL LOW (ref 135–145)
Total Bilirubin: 0.7 mg/dL (ref 0.3–1.2)
Total Protein: 6.1 g/dL — ABNORMAL LOW (ref 6.5–8.1)

## 2022-09-15 LAB — URINALYSIS, W/ REFLEX TO CULTURE (INFECTION SUSPECTED)
Bilirubin Urine: NEGATIVE
Glucose, UA: NEGATIVE mg/dL
Hgb urine dipstick: NEGATIVE
Ketones, ur: NEGATIVE mg/dL
Nitrite: POSITIVE — AB
Protein, ur: 30 mg/dL — AB
Specific Gravity, Urine: 1.017 (ref 1.005–1.030)
WBC, UA: >50 WBC/hpf (ref 0–5)
pH: 6 (ref 5.0–8.0)

## 2022-09-15 LAB — LIPASE, BLOOD: Lipase: 35 U/L (ref 11–51)

## 2022-09-15 LAB — TROPONIN I (HIGH SENSITIVITY)
Troponin I (High Sensitivity): 8 ng/L (ref <18)
Troponin I (High Sensitivity): 9 ng/L (ref <18)

## 2022-09-15 LAB — SARS CORONAVIRUS 2 BY RT PCR: SARS Coronavirus 2 by RT PCR: NEGATIVE

## 2022-09-15 MED ORDER — IOHEXOL 300 MG/ML  SOLN
100.0000 mL | Freq: Once | INTRAMUSCULAR | Status: AC | PRN
  Administered 2022-09-15: 100 mL via INTRAVENOUS

## 2022-09-15 MED ORDER — CEPHALEXIN 500 MG PO CAPS
500.0000 mg | ORAL_CAPSULE | Freq: Once | ORAL | Status: AC
  Administered 2022-09-15: 500 mg via ORAL
  Filled 2022-09-15: qty 1

## 2022-09-15 MED ORDER — CEPHALEXIN 500 MG PO CAPS: 500.0000 mg | ORAL_CAPSULE | Freq: Two times a day (BID) | ORAL | 0 refills | Status: DC

## 2022-09-15 NOTE — ED Notes (Signed)
Discharge paperwork reviewed with pt and caregiver at bedside.

## 2022-09-15 NOTE — ED Triage Notes (Signed)
Pt arrives via EMS from home with reports of CP that started this morning. EMS gave 324 ASA. Pts sister who is caregiver is in hospital. Pt points to middle of chest when asked where pain is.

## 2022-09-15 NOTE — ED Provider Notes (Signed)
[facility] Provider Note    Event Date/Time   First MD Initiated Contact with Patient 09/15/22 0745     (approximate)   History   Chief Complaint: Chest Pain   HPI  Madison Burns is a 72 y.o. female with a history of atrial fibrillation, myasthenia gravis, intellectual disability, epilepsy who comes the ED complaining of epigastric pain this morning.  Nonradiating, feels sharp.  No aggravating or alleviating factors.  Reports that it woke her up from sleep.  Denies fever or bodyaches.  Does endorse nonproductive cough.     Physical Exam   Triage Vital Signs: ED Triage Vitals  Encounter Vitals Group     BP 09/15/22 0743 109/78     Systolic BP Percentile --      Diastolic BP Percentile --      Pulse Rate 09/15/22 0743 75     Resp 09/15/22 0743 18     Temp 09/15/22 0743 98.2 F (36.8 C)     Temp Source 09/15/22 0743 Oral     SpO2 09/15/22 0743 98 %     Weight 09/15/22 0741 201 lb 1 oz (91.2 kg)     Height 09/15/22 0741 5\' 4"  (1.626 m)     Head Circumference --      [facility] --      Pain Score 09/15/22 0741 10     Pain Loc --      Pain Education --      Exclude from Growth Chart --     Most recent vital signs: Vitals:   09/15/22 0947 09/15/22 1000  BP: (!) 138/93 (!) 165/93  Pulse: 81 79  Resp: (!) 21 16  Temp:    SpO2: 95% 96%    General: Awake, no distress.  CV:  Good peripheral perfusion.  Regular rate and rhythm Resp:  Normal effort.  Clear to auscultation bilaterally Abd:  No distention.  Mild generalized abdominal tenderness with more pronounced epigastric tenderness.  No signs of peritonitis Other:  Moist oral mucosa.  No rash.   ED Results / Procedures / Treatments   Labs (all labs ordered are listed, but only abnormal results are displayed) Labs Reviewed  COMPREHENSIVE METABOLIC PANEL - Abnormal; Notable for the following components:      Result Value   Sodium 132 (*)    Potassium 3.3 (*)    Chloride 95 (*)     Glucose, Bld 102 (*)    Calcium 8.4 (*)    Total Protein 6.1 (*)    Albumin 3.2 (*)    All other components within normal limits  CBC WITH DIFFERENTIAL/PLATELET - Abnormal; Notable for the following components:   RDW 17.4 (*)    All other components within normal limits  URINALYSIS, W/ REFLEX TO CULTURE (INFECTION SUSPECTED) - Abnormal; Notable for the following components:   Color, Urine YELLOW (*)    APPearance CLOUDY (*)    Protein, ur 30 (*)    Nitrite POSITIVE (*)    Leukocytes,Ua LARGE (*)    Bacteria, UA MANY (*)    All other components within normal limits  SARS CORONAVIRUS 2 BY RT PCR  URINE CULTURE  LIPASE, BLOOD  TROPONIN I (HIGH SENSITIVITY)  TROPONIN I (HIGH SENSITIVITY)     EKG Interpreted by me Sinus rhythm rate of 75.  Normal axis, normal intervals.  Normal QRS ST segments and T waves.  No ischemic changes   RADIOLOGY CT abdomen pelvis interpreted by me, negative for  free air or obstruction.  Radiology report reviewed, noting subacute to chronic L1 compression deformity.   PROCEDURES:  Procedures   MEDICATIONS ORDERED IN ED: Medications  cephALEXin (KEFLEX) capsule 500 mg (has no administration in time range)  iohexol (OMNIPAQUE) 300 MG/ML solution 100 mL (100 mLs Intravenous Contrast Given 09/15/22 0856)     IMPRESSION / MDM / ASSESSMENT AND PLAN / ED COURSE  I reviewed the triage vital signs and the nursing notes.  DDx: Pancreatitis, gastritis, bowel obstruction, internal hernia, non-STEMI, pneumothorax, pneumonia, COVID.  Doubt PE dissection pericardial effusion GI perforation mesenteric ischemia or AAA.  Patient's presentation is most consistent with acute presentation with potential threat to life or bodily function.  72 year old patient presents with epigastric pain and more generalized tenderness on exam.  Vital signs are normal.  Will check labs, chest x-ray, CT abdomen pelvis.  ----------------------------------------- 10:48 AM on  09/15/2022 ----------------------------------------- Workup overall reassuring and negative for acute findings except for UTI.  She is not septic, she is calm and nontoxic, has home caregiver support.  Results discussed with the patient as well as her family members.  Stable for discharge and PCP follow-up.       FINAL CLINICAL IMPRESSION(S) / ED DIAGNOSES   Final diagnoses:  Epigastric pain  Cystitis     Rx / DC Orders   ED Discharge Orders          Ordered    cephALEXin (KEFLEX) 500 MG capsule  2 times daily        09/15/22 1047             Note:  This document was prepared using Dragon voice recognition software and may include unintentional dictation errors.   Sharman Cheek, MD 09/15/22 1048

## 2022-09-19 DIAGNOSIS — K589 Irritable bowel syndrome without diarrhea: Secondary | ICD-10-CM | POA: Diagnosis not present

## 2022-09-19 DIAGNOSIS — Z85828 Personal history of other malignant neoplasm of skin: Secondary | ICD-10-CM | POA: Diagnosis not present

## 2022-09-19 DIAGNOSIS — I10 Essential (primary) hypertension: Secondary | ICD-10-CM | POA: Diagnosis not present

## 2022-09-19 DIAGNOSIS — N3281 Overactive bladder: Secondary | ICD-10-CM | POA: Diagnosis not present

## 2022-09-19 DIAGNOSIS — E869 Volume depletion, unspecified: Secondary | ICD-10-CM | POA: Diagnosis not present

## 2022-09-19 DIAGNOSIS — R911 Solitary pulmonary nodule: Secondary | ICD-10-CM | POA: Diagnosis not present

## 2022-09-19 DIAGNOSIS — I480 Paroxysmal atrial fibrillation: Secondary | ICD-10-CM | POA: Diagnosis not present

## 2022-09-19 DIAGNOSIS — G4733 Obstructive sleep apnea (adult) (pediatric): Secondary | ICD-10-CM | POA: Diagnosis not present

## 2022-09-19 DIAGNOSIS — K760 Fatty (change of) liver, not elsewhere classified: Secondary | ICD-10-CM | POA: Diagnosis not present

## 2022-09-19 DIAGNOSIS — Z85528 Personal history of other malignant neoplasm of kidney: Secondary | ICD-10-CM | POA: Diagnosis not present

## 2022-09-19 DIAGNOSIS — E871 Hypo-osmolality and hyponatremia: Secondary | ICD-10-CM | POA: Diagnosis not present

## 2022-09-19 DIAGNOSIS — Z85238 Personal history of other malignant neoplasm of thymus: Secondary | ICD-10-CM | POA: Diagnosis not present

## 2022-09-19 DIAGNOSIS — E7800 Pure hypercholesterolemia, unspecified: Secondary | ICD-10-CM | POA: Diagnosis not present

## 2022-09-19 DIAGNOSIS — S62357D Nondisplaced fracture of shaft of fifth metacarpal bone, left hand, subsequent encounter for fracture with routine healing: Secondary | ICD-10-CM | POA: Diagnosis not present

## 2022-09-19 DIAGNOSIS — E039 Hypothyroidism, unspecified: Secondary | ICD-10-CM | POA: Diagnosis not present

## 2022-09-19 DIAGNOSIS — L89626 Pressure-induced deep tissue damage of left heel: Secondary | ICD-10-CM | POA: Diagnosis not present

## 2022-09-19 DIAGNOSIS — M7981 Nontraumatic hematoma of soft tissue: Secondary | ICD-10-CM | POA: Diagnosis not present

## 2022-09-19 DIAGNOSIS — K219 Gastro-esophageal reflux disease without esophagitis: Secondary | ICD-10-CM | POA: Diagnosis not present

## 2022-09-19 DIAGNOSIS — G7000 Myasthenia gravis without (acute) exacerbation: Secondary | ICD-10-CM | POA: Diagnosis not present

## 2022-09-22 ENCOUNTER — Telehealth: Payer: Self-pay

## 2022-09-22 NOTE — Telephone Encounter (Signed)
Transition Care Management Unsuccessful Follow-up Telephone Call  Date of discharge and from where:  09/15/2022 [facility]  Attempts:  2nd Attempt  Reason for unsuccessful TCM follow-up call:  No answer/busy   [facility]  [facility] Population Health Community Resource Care Guide   [email or web address]  ?? [phone]   Website: [email or web address]  [email or web address]

## 2022-09-22 NOTE — Telephone Encounter (Signed)
Transition Care Management Unsuccessful Follow-up Telephone Call  Date of discharge and from where:  09/15/2022 [facility]  Attempts:  1st Attempt  Reason for unsuccessful TCM follow-up call:  Unable to leave message   [facility]  [facility] Population Health Community Resource Care Guide   [email or web address]  ?? [phone]   Website: [email or web address]  [email or web address]

## 2022-09-23 ENCOUNTER — Telehealth: Payer: Self-pay | Admitting: Internal Medicine

## 2022-09-23 NOTE — Telephone Encounter (Signed)
Patient's brother Jazzmine Darensbourg called. He stated Madison Burns's care giver Harriett Sine is in the hospital and he wants to know if they could get home care for his sister Madison Burns. Brett Canales number is [phone]. He wants to know if someone could call him.

## 2022-09-24 ENCOUNTER — Telehealth: Payer: Self-pay | Admitting: Internal Medicine

## 2022-09-24 DIAGNOSIS — R911 Solitary pulmonary nodule: Secondary | ICD-10-CM | POA: Diagnosis not present

## 2022-09-24 DIAGNOSIS — E871 Hypo-osmolality and hyponatremia: Secondary | ICD-10-CM | POA: Diagnosis not present

## 2022-09-24 DIAGNOSIS — I480 Paroxysmal atrial fibrillation: Secondary | ICD-10-CM | POA: Diagnosis not present

## 2022-09-24 DIAGNOSIS — S62357D Nondisplaced fracture of shaft of fifth metacarpal bone, left hand, subsequent encounter for fracture with routine healing: Secondary | ICD-10-CM | POA: Diagnosis not present

## 2022-09-24 DIAGNOSIS — L89626 Pressure-induced deep tissue damage of left heel: Secondary | ICD-10-CM | POA: Diagnosis not present

## 2022-09-24 DIAGNOSIS — E869 Volume depletion, unspecified: Secondary | ICD-10-CM | POA: Diagnosis not present

## 2022-09-24 DIAGNOSIS — Z85528 Personal history of other malignant neoplasm of kidney: Secondary | ICD-10-CM | POA: Diagnosis not present

## 2022-09-24 DIAGNOSIS — N3281 Overactive bladder: Secondary | ICD-10-CM | POA: Diagnosis not present

## 2022-09-24 DIAGNOSIS — I10 Essential (primary) hypertension: Secondary | ICD-10-CM | POA: Diagnosis not present

## 2022-09-24 DIAGNOSIS — M7981 Nontraumatic hematoma of soft tissue: Secondary | ICD-10-CM | POA: Diagnosis not present

## 2022-09-24 DIAGNOSIS — K589 Irritable bowel syndrome without diarrhea: Secondary | ICD-10-CM | POA: Diagnosis not present

## 2022-09-24 DIAGNOSIS — Z85828 Personal history of other malignant neoplasm of skin: Secondary | ICD-10-CM | POA: Diagnosis not present

## 2022-09-24 DIAGNOSIS — G4733 Obstructive sleep apnea (adult) (pediatric): Secondary | ICD-10-CM | POA: Diagnosis not present

## 2022-09-24 DIAGNOSIS — E039 Hypothyroidism, unspecified: Secondary | ICD-10-CM | POA: Diagnosis not present

## 2022-09-24 DIAGNOSIS — K760 Fatty (change of) liver, not elsewhere classified: Secondary | ICD-10-CM | POA: Diagnosis not present

## 2022-09-24 DIAGNOSIS — K219 Gastro-esophageal reflux disease without esophagitis: Secondary | ICD-10-CM | POA: Diagnosis not present

## 2022-09-24 DIAGNOSIS — E7800 Pure hypercholesterolemia, unspecified: Secondary | ICD-10-CM | POA: Diagnosis not present

## 2022-09-24 DIAGNOSIS — Z85238 Personal history of other malignant neoplasm of thymus: Secondary | ICD-10-CM | POA: Diagnosis not present

## 2022-09-24 DIAGNOSIS — G7000 Myasthenia gravis without (acute) exacerbation: Secondary | ICD-10-CM | POA: Diagnosis not present

## 2022-09-24 NOTE — Telephone Encounter (Signed)
Spoke with Tammy at [facility]. Was advised that she would look into getting patient a bed and give me a call back or give Brett Canales a call.

## 2022-09-24 NOTE — Telephone Encounter (Signed)
Patient's therapist called concerned about a large bruise on patient's upper bottom. The patient did fall on her back. She is concerned that the caregivers are putting ointment on her bruise. But she feels it is not helping. The therapist would like to know what to do. Her name is Madison Burns and her number is [phone]. She would like for the CMA to contact her.

## 2022-09-25 DIAGNOSIS — L89626 Pressure-induced deep tissue damage of left heel: Secondary | ICD-10-CM | POA: Diagnosis not present

## 2022-09-25 DIAGNOSIS — Z85528 Personal history of other malignant neoplasm of kidney: Secondary | ICD-10-CM | POA: Diagnosis not present

## 2022-09-25 DIAGNOSIS — K760 Fatty (change of) liver, not elsewhere classified: Secondary | ICD-10-CM | POA: Diagnosis not present

## 2022-09-25 DIAGNOSIS — K219 Gastro-esophageal reflux disease without esophagitis: Secondary | ICD-10-CM | POA: Diagnosis not present

## 2022-09-25 DIAGNOSIS — M7981 Nontraumatic hematoma of soft tissue: Secondary | ICD-10-CM | POA: Diagnosis not present

## 2022-09-25 DIAGNOSIS — E039 Hypothyroidism, unspecified: Secondary | ICD-10-CM | POA: Diagnosis not present

## 2022-09-25 DIAGNOSIS — Z85828 Personal history of other malignant neoplasm of skin: Secondary | ICD-10-CM | POA: Diagnosis not present

## 2022-09-25 DIAGNOSIS — K589 Irritable bowel syndrome without diarrhea: Secondary | ICD-10-CM | POA: Diagnosis not present

## 2022-09-25 DIAGNOSIS — G7000 Myasthenia gravis without (acute) exacerbation: Secondary | ICD-10-CM | POA: Diagnosis not present

## 2022-09-25 DIAGNOSIS — Z85238 Personal history of other malignant neoplasm of thymus: Secondary | ICD-10-CM | POA: Diagnosis not present

## 2022-09-25 DIAGNOSIS — G4733 Obstructive sleep apnea (adult) (pediatric): Secondary | ICD-10-CM | POA: Diagnosis not present

## 2022-09-25 DIAGNOSIS — R911 Solitary pulmonary nodule: Secondary | ICD-10-CM | POA: Diagnosis not present

## 2022-09-25 DIAGNOSIS — S62357D Nondisplaced fracture of shaft of fifth metacarpal bone, left hand, subsequent encounter for fracture with routine healing: Secondary | ICD-10-CM | POA: Diagnosis not present

## 2022-09-25 DIAGNOSIS — I480 Paroxysmal atrial fibrillation: Secondary | ICD-10-CM | POA: Diagnosis not present

## 2022-09-25 DIAGNOSIS — E869 Volume depletion, unspecified: Secondary | ICD-10-CM | POA: Diagnosis not present

## 2022-09-25 DIAGNOSIS — N3281 Overactive bladder: Secondary | ICD-10-CM | POA: Diagnosis not present

## 2022-09-25 DIAGNOSIS — E871 Hypo-osmolality and hyponatremia: Secondary | ICD-10-CM | POA: Diagnosis not present

## 2022-09-25 DIAGNOSIS — I10 Essential (primary) hypertension: Secondary | ICD-10-CM | POA: Diagnosis not present

## 2022-09-25 DIAGNOSIS — E7800 Pure hypercholesterolemia, unspecified: Secondary | ICD-10-CM | POA: Diagnosis not present

## 2022-09-25 NOTE — Telephone Encounter (Signed)
Spoke with Tammy. She can use the FL2 form from July. Will send over orders for PT,OT and speech evaluation to be completed once admitted to [facility].

## 2022-09-25 NOTE — Telephone Encounter (Signed)
LMTCB

## 2022-09-25 NOTE — Telephone Encounter (Signed)
Tammy from [facility] called stating pt is supposed to come tomorrow and she is supposed to have an updated FL2 form

## 2022-09-27 DIAGNOSIS — R2681 Unsteadiness on feet: Secondary | ICD-10-CM | POA: Diagnosis not present

## 2022-09-27 DIAGNOSIS — G7000 Myasthenia gravis without (acute) exacerbation: Secondary | ICD-10-CM | POA: Diagnosis not present

## 2022-09-28 DIAGNOSIS — R2681 Unsteadiness on feet: Secondary | ICD-10-CM | POA: Diagnosis not present

## 2022-09-28 DIAGNOSIS — G7000 Myasthenia gravis without (acute) exacerbation: Secondary | ICD-10-CM | POA: Diagnosis not present

## 2022-09-29 DIAGNOSIS — G7000 Myasthenia gravis without (acute) exacerbation: Secondary | ICD-10-CM | POA: Diagnosis not present

## 2022-09-29 DIAGNOSIS — R2681 Unsteadiness on feet: Secondary | ICD-10-CM | POA: Diagnosis not present

## 2022-09-29 DIAGNOSIS — I10 Essential (primary) hypertension: Secondary | ICD-10-CM | POA: Diagnosis not present

## 2022-09-30 DIAGNOSIS — G7000 Myasthenia gravis without (acute) exacerbation: Secondary | ICD-10-CM | POA: Diagnosis not present

## 2022-09-30 DIAGNOSIS — R2681 Unsteadiness on feet: Secondary | ICD-10-CM | POA: Diagnosis not present

## 2022-10-01 DIAGNOSIS — R2681 Unsteadiness on feet: Secondary | ICD-10-CM | POA: Diagnosis not present

## 2022-10-01 DIAGNOSIS — G7000 Myasthenia gravis without (acute) exacerbation: Secondary | ICD-10-CM | POA: Diagnosis not present

## 2022-10-01 NOTE — Telephone Encounter (Signed)
Called and spoke with PT. Patient is currently at [facility]. Angie stated that she had just left from visiting with patient and her sister and she was doing ok. Staying at [facility] while sister recovers from surgery.

## 2022-10-02 ENCOUNTER — Encounter (INDEPENDENT_AMBULATORY_CARE_PROVIDER_SITE_OTHER): Payer: Self-pay

## 2022-10-02 DIAGNOSIS — G4733 Obstructive sleep apnea (adult) (pediatric): Secondary | ICD-10-CM | POA: Diagnosis not present

## 2022-10-02 DIAGNOSIS — G7000 Myasthenia gravis without (acute) exacerbation: Secondary | ICD-10-CM | POA: Diagnosis not present

## 2022-10-02 DIAGNOSIS — M7981 Nontraumatic hematoma of soft tissue: Secondary | ICD-10-CM | POA: Diagnosis not present

## 2022-10-02 DIAGNOSIS — Z85828 Personal history of other malignant neoplasm of skin: Secondary | ICD-10-CM

## 2022-10-02 DIAGNOSIS — Z85238 Personal history of other malignant neoplasm of thymus: Secondary | ICD-10-CM

## 2022-10-02 DIAGNOSIS — I480 Paroxysmal atrial fibrillation: Secondary | ICD-10-CM | POA: Diagnosis not present

## 2022-10-02 DIAGNOSIS — G40909 Epilepsy, unspecified, not intractable, without status epilepticus: Secondary | ICD-10-CM

## 2022-10-02 DIAGNOSIS — K760 Fatty (change of) liver, not elsewhere classified: Secondary | ICD-10-CM

## 2022-10-02 DIAGNOSIS — N3281 Overactive bladder: Secondary | ICD-10-CM | POA: Diagnosis not present

## 2022-10-02 DIAGNOSIS — S62357D Nondisplaced fracture of shaft of fifth metacarpal bone, left hand, subsequent encounter for fracture with routine healing: Secondary | ICD-10-CM | POA: Diagnosis not present

## 2022-10-02 DIAGNOSIS — F79 Unspecified intellectual disabilities: Secondary | ICD-10-CM

## 2022-10-02 DIAGNOSIS — R911 Solitary pulmonary nodule: Secondary | ICD-10-CM

## 2022-10-02 DIAGNOSIS — L89626 Pressure-induced deep tissue damage of left heel: Secondary | ICD-10-CM | POA: Diagnosis not present

## 2022-10-02 DIAGNOSIS — F0394 Unspecified dementia, unspecified severity, with anxiety: Secondary | ICD-10-CM | POA: Diagnosis not present

## 2022-10-02 DIAGNOSIS — E871 Hypo-osmolality and hyponatremia: Secondary | ICD-10-CM | POA: Diagnosis not present

## 2022-10-02 DIAGNOSIS — I10 Essential (primary) hypertension: Secondary | ICD-10-CM | POA: Diagnosis not present

## 2022-10-02 DIAGNOSIS — Z85528 Personal history of other malignant neoplasm of kidney: Secondary | ICD-10-CM

## 2022-10-02 DIAGNOSIS — E039 Hypothyroidism, unspecified: Secondary | ICD-10-CM

## 2022-10-02 DIAGNOSIS — K219 Gastro-esophageal reflux disease without esophagitis: Secondary | ICD-10-CM

## 2022-10-02 DIAGNOSIS — E869 Volume depletion, unspecified: Secondary | ICD-10-CM | POA: Diagnosis not present

## 2022-10-02 DIAGNOSIS — E7800 Pure hypercholesterolemia, unspecified: Secondary | ICD-10-CM

## 2022-10-02 DIAGNOSIS — K589 Irritable bowel syndrome without diarrhea: Secondary | ICD-10-CM | POA: Diagnosis not present

## 2022-10-03 ENCOUNTER — Telehealth: Payer: Self-pay | Admitting: Internal Medicine

## 2022-10-03 DIAGNOSIS — R2681 Unsteadiness on feet: Secondary | ICD-10-CM | POA: Diagnosis not present

## 2022-10-03 DIAGNOSIS — G7000 Myasthenia gravis without (acute) exacerbation: Secondary | ICD-10-CM | POA: Diagnosis not present

## 2022-10-03 NOTE — Telephone Encounter (Signed)
Refaxed to [facility]

## 2022-10-03 NOTE — Telephone Encounter (Signed)
Kiara from [facility] [employer] called stating they faxed a paper to the provider. Janine Limbo stated the first page was signed and dated but the medical necessity paper was not signed or dated. Janine Limbo stated that she faxed the form back several times now but she has not received anything back

## 2022-10-06 ENCOUNTER — Ambulatory Visit (INDEPENDENT_AMBULATORY_CARE_PROVIDER_SITE_OTHER): Payer: 59 | Admitting: Podiatry

## 2022-10-06 DIAGNOSIS — R2681 Unsteadiness on feet: Secondary | ICD-10-CM | POA: Diagnosis not present

## 2022-10-06 DIAGNOSIS — M6281 Muscle weakness (generalized): Secondary | ICD-10-CM | POA: Diagnosis not present

## 2022-10-06 DIAGNOSIS — G7000 Myasthenia gravis without (acute) exacerbation: Secondary | ICD-10-CM | POA: Diagnosis not present

## 2022-10-06 DIAGNOSIS — Z91199 Patient's noncompliance with other medical treatment and regimen due to unspecified reason: Secondary | ICD-10-CM

## 2022-10-06 NOTE — Progress Notes (Signed)
1. No-show for appointment   Recent ED visit. 

## 2022-10-07 DIAGNOSIS — G7000 Myasthenia gravis without (acute) exacerbation: Secondary | ICD-10-CM | POA: Diagnosis not present

## 2022-10-07 DIAGNOSIS — R2681 Unsteadiness on feet: Secondary | ICD-10-CM | POA: Diagnosis not present

## 2022-10-08 DIAGNOSIS — R2681 Unsteadiness on feet: Secondary | ICD-10-CM | POA: Diagnosis not present

## 2022-10-08 DIAGNOSIS — G7000 Myasthenia gravis without (acute) exacerbation: Secondary | ICD-10-CM | POA: Diagnosis not present

## 2022-10-09 DIAGNOSIS — M6281 Muscle weakness (generalized): Secondary | ICD-10-CM | POA: Diagnosis not present

## 2022-10-09 DIAGNOSIS — F0394 Unspecified dementia, unspecified severity, with anxiety: Secondary | ICD-10-CM | POA: Diagnosis not present

## 2022-10-09 DIAGNOSIS — I480 Paroxysmal atrial fibrillation: Secondary | ICD-10-CM | POA: Diagnosis not present

## 2022-10-09 DIAGNOSIS — G7000 Myasthenia gravis without (acute) exacerbation: Secondary | ICD-10-CM | POA: Diagnosis not present

## 2022-10-10 DIAGNOSIS — R2681 Unsteadiness on feet: Secondary | ICD-10-CM | POA: Diagnosis not present

## 2022-10-10 DIAGNOSIS — G7000 Myasthenia gravis without (acute) exacerbation: Secondary | ICD-10-CM | POA: Diagnosis not present

## 2022-10-13 DIAGNOSIS — R2681 Unsteadiness on feet: Secondary | ICD-10-CM | POA: Diagnosis not present

## 2022-10-13 DIAGNOSIS — G7000 Myasthenia gravis without (acute) exacerbation: Secondary | ICD-10-CM | POA: Diagnosis not present

## 2022-10-14 DIAGNOSIS — G7000 Myasthenia gravis without (acute) exacerbation: Secondary | ICD-10-CM | POA: Diagnosis not present

## 2022-10-14 DIAGNOSIS — R2681 Unsteadiness on feet: Secondary | ICD-10-CM | POA: Diagnosis not present

## 2022-10-15 DIAGNOSIS — R2681 Unsteadiness on feet: Secondary | ICD-10-CM | POA: Diagnosis not present

## 2022-10-15 DIAGNOSIS — G7000 Myasthenia gravis without (acute) exacerbation: Secondary | ICD-10-CM | POA: Diagnosis not present

## 2022-10-16 DIAGNOSIS — L03116 Cellulitis of left lower limb: Secondary | ICD-10-CM | POA: Diagnosis not present

## 2022-10-16 DIAGNOSIS — R600 Localized edema: Secondary | ICD-10-CM | POA: Diagnosis not present

## 2022-10-17 DIAGNOSIS — G7000 Myasthenia gravis without (acute) exacerbation: Secondary | ICD-10-CM | POA: Diagnosis not present

## 2022-10-17 DIAGNOSIS — R2681 Unsteadiness on feet: Secondary | ICD-10-CM | POA: Diagnosis not present

## 2022-10-20 DIAGNOSIS — G7000 Myasthenia gravis without (acute) exacerbation: Secondary | ICD-10-CM | POA: Diagnosis not present

## 2022-10-20 DIAGNOSIS — R2681 Unsteadiness on feet: Secondary | ICD-10-CM | POA: Diagnosis not present

## 2022-10-21 DIAGNOSIS — S61511A Laceration without foreign body of right wrist, initial encounter: Secondary | ICD-10-CM | POA: Diagnosis not present

## 2022-10-21 DIAGNOSIS — R600 Localized edema: Secondary | ICD-10-CM | POA: Diagnosis not present

## 2022-10-21 DIAGNOSIS — G7000 Myasthenia gravis without (acute) exacerbation: Secondary | ICD-10-CM | POA: Diagnosis not present

## 2022-10-21 DIAGNOSIS — L03116 Cellulitis of left lower limb: Secondary | ICD-10-CM | POA: Diagnosis not present

## 2022-10-21 DIAGNOSIS — R2681 Unsteadiness on feet: Secondary | ICD-10-CM | POA: Diagnosis not present

## 2022-10-22 DIAGNOSIS — G7000 Myasthenia gravis without (acute) exacerbation: Secondary | ICD-10-CM | POA: Diagnosis not present

## 2022-10-22 DIAGNOSIS — R2681 Unsteadiness on feet: Secondary | ICD-10-CM | POA: Diagnosis not present

## 2022-10-24 DIAGNOSIS — G7000 Myasthenia gravis without (acute) exacerbation: Secondary | ICD-10-CM | POA: Diagnosis not present

## 2022-10-24 DIAGNOSIS — R2681 Unsteadiness on feet: Secondary | ICD-10-CM | POA: Diagnosis not present

## 2022-10-27 DIAGNOSIS — G7000 Myasthenia gravis without (acute) exacerbation: Secondary | ICD-10-CM | POA: Diagnosis not present

## 2022-10-27 DIAGNOSIS — R600 Localized edema: Secondary | ICD-10-CM | POA: Diagnosis not present

## 2022-10-27 DIAGNOSIS — R634 Abnormal weight loss: Secondary | ICD-10-CM | POA: Diagnosis not present

## 2022-10-29 ENCOUNTER — Ambulatory Visit: Payer: 59 | Admitting: Internal Medicine

## 2022-10-29 DIAGNOSIS — I700 Atherosclerosis of aorta: Secondary | ICD-10-CM | POA: Insufficient documentation

## 2022-10-29 NOTE — Progress Notes (Unsigned)
Subjective:    Patient ID: Madison Burns, female    DOB: 07-May-1950, 72 y.o.   MRN: [id number]  Patient here for No chief complaint on file.   HPI Here for a scheduled follow up. She is accompanied by her sister. History obtained from both of them. Has been followed by [facility] - f/u lumbar fracture (wedge compression fracture first lumbar vertebra) - s/p previous fall. No increased back pain reported today.  Sees cardiology for f/u paroxysmal afib.  Remains on amiodarone and diltiazem.  No anticoagulation due to frequent falls.  History of OSA  - cpap. Evaluated in ER 09/15/22 - epigastric pain. CT ok.  Diagnosed with UTI. Treated with keflex. Placed in [facility]   Past Medical History:  Diagnosis Date   Cancer of kidney (HCC) 2021   Complication of anesthesia    Myasthenia gravis   GERD (gastroesophageal reflux disease)    History of seizure disorder    Hypercholesterolemia    Hypertension    no longer has it. wt loss   Hypokalemia 04/10/2022   Hyponatremia 04/10/2022   Hypothyroidism    Irritable bowel syndrome    Lactic acidosis 04/10/2022   Melanoma (HCC) 2017   Melanoma on neck   Myasthenia gravis (HCC) 2011   OSA on CPAP    Thymoma, malignant (HCC)    Past Surgical History:  Procedure Laterality Date   ABDOMINAL HYSTERECTOMY  1990   APPENDECTOMY  1999   CHOLECYSTECTOMY  2013   COLONOSCOPY WITH PROPOFOL N/A 06/30/2017   Procedure: COLONOSCOPY WITH PROPOFOL;  Surgeon: Toledo, Boykin Nearing, MD;  Location: [facility] ENDOSCOPY;  Service: Gastroenterology;  Laterality: N/A;   CYSTOSCOPY W/ URETERAL STENT PLACEMENT Left 08/17/2019   Procedure: CYSTOSCOPY WITH RETROGRADE PYELOGRAM/URETERAL STENT PLACEMENT;  Surgeon: Sebastian Ache, MD;  Location: [facility];  Service: Urology;  Laterality: Left;  30 MINS   ESOPHAGOGASTRODUODENOSCOPY (EGD) WITH PROPOFOL N/A 06/30/2017   Procedure: ESOPHAGOGASTRODUODENOSCOPY (EGD) WITH PROPOFOL;  Surgeon: Toledo, Boykin Nearing, MD;  Location: [facility] ENDOSCOPY;  Service:  Gastroenterology;  Laterality: N/A;   IR RADIOLOGIST EVAL & MGMT  06/28/2019   IR RADIOLOGIST EVAL & MGMT  09/14/2019   IR RADIOLOGIST EVAL & MGMT  03/28/2020   IR RADIOLOGIST EVAL & MGMT  10/10/2020   IR RADIOLOGIST EVAL & MGMT  11/22/2021   RADIOFREQUENCY ABLATION Left 08/17/2019   Procedure: LEFT RENAL CRYO ABLATION;  Surgeon: Malachy Moan, MD;  Location: [facility];  Service: Anesthesiology;  Laterality: Left;   TONSILLECTOMY  1959   Family History  Problem Relation Age of Onset   Colon cancer Maternal Grandfather        stomach/colon   Heart disease Father        myocardial infarction   Hyperlipidemia Father    Hyperlipidemia Sister    Diabetes Sister    Bone cancer Other        cousin   Alzheimer's disease Mother        maternal aunts   Skin cancer Mother    Myasthenia gravis Brother        ocular   Skin cancer Brother    Stroke Paternal Grandfather    Breast cancer Neg Hx    Social History   Socioeconomic History   Marital status: Single    Spouse name: Not on file   Number of children: 0   Years of education: Special Ed   Highest education level: Not on file  Occupational History   Occupation: Unemployed  Tobacco Use  Smoking status: Never   Smokeless tobacco: Never  Vaping Use   Vaping status: Never Used  Substance and Sexual Activity   Alcohol use: No    Alcohol/week: 0.0 standard drinks of alcohol   Drug use: No   Sexual activity: Not Currently  Other Topics Concern   Not on file  Social History Narrative   06/23/17 lives with sister, Harriett Sine   Regular exercise-no   Caffeine Use-yes   Social Determinants of Health   Financial Resource Strain: Low Risk  (05/30/2022)   Overall Financial Resource Strain (CARDIA)    Difficulty of Paying Living Expenses: Not hard at all  Food Insecurity: No Food Insecurity (05/30/2022)   Hunger Vital Sign    Worried About Running Out of Food in the Last Year: Never true    Ran Out of Food in the Last Year: Never true   Transportation Needs: No Transportation Needs (05/30/2022)   PRAPARE - Administrator, Civil Service (Medical): No    Lack of Transportation (Non-Medical): No  Physical Activity: Insufficiently Active (05/30/2022)   Exercise Vital Sign    Days of Exercise per Week: 5 days    Minutes of Exercise per Session: 20 min  Stress: No Stress Concern Present (05/30/2022)   [employer] of Occupational Health - Occupational Stress Questionnaire    Feeling of Stress : Not at all  Social Connections: Unknown (05/30/2022)   Social Connection and Isolation Panel [NHANES]    Frequency of Communication with Friends and Family: Not on file    Frequency of Social Gatherings with Friends and Family: More than three times a week    Attends Religious Services: Not on file    Active Member of Clubs or Organizations: Not on file    Attends Banker Meetings: Not on file    Marital Status: Not on file     Review of Systems     Objective:     There were no vitals taken for this visit. Wt Readings from Last 3 Encounters:  09/15/22 201 lb 1 oz (91.2 kg)  08/22/22 201 lb (91.2 kg)  07/09/22 214 lb 9.6 oz (97.3 kg)    Physical Exam   Outpatient Encounter Medications as of 10/29/2022  Medication Sig   acetaminophen (TYLENOL) 325 MG tablet Take 2 tablets (650 mg total) by mouth every 6 (six) hours as needed for mild pain (or Fever >/= 101).   amiodarone (PACERONE) 200 MG tablet Take 1 tablet (200 mg total) by mouth daily.   Ascorbic Acid (VITAMIN C) 1000 MG tablet Take 1,000 mg by mouth at bedtime.   B Complex-C (B-COMPLEX WITH VITAMIN C) tablet Take 1 tablet by mouth every evening.   benzonatate (TESSALON) 100 MG capsule TAKE 1 CAPSULE BY MOUTH TWICE DAILY AS NEEDED FOR COUGH   Biotin 95638 MCG TABS Take 10,000 mcg by mouth daily at 2 PM. (1430)   Calcium-Phosphorus-Vitamin D (CITRACAL +D3 PO) Take 1 tablet by mouth daily.   cephALEXin (KEFLEX) 500 MG capsule Take 1 capsule  (500 mg total) by mouth 2 (two) times daily.   desmopressin (DDAVP NASAL) 0.01 % solution Place 1 spray (10 mcg total) into the nose 2 (two) times daily.   diltiazem (CARDIZEM CD) 180 MG 24 hr capsule Take 180 mg by mouth daily.   DULoxetine (CYMBALTA) 30 MG capsule Take 1 capsule (30 mg total) by mouth daily.   folic acid (FOLVITE) 1 MG tablet TAKE 2 TABLETS BY MOUTH ONCE DAILY  Garlic 1000 MG CAPS Take 1,000 mg by mouth in the morning, at noon, and at bedtime. (0800, 1430, 2000)   levothyroxine (SYNTHROID) 75 MCG tablet TAKE 1 TABLET BY MOUTH ONCE DAILY ON AN EMPTY STOMACH. WAIT 30 MINUTES BEFORE TAKING OTHER MEDS.   magnesium hydroxide (MILK OF MAGNESIA) 400 MG/5ML suspension Take 30 mLs by mouth daily as needed for mild constipation.   methotrexate (RHEUMATREX) 2.5 MG tablet TAKE 4 TABLETS BY MOUTH ONCE A WEEK WITHFOLIC ACID   pantoprazole (PROTONIX) 40 MG tablet Take 40 mg by mouth daily.    Probiotic Product (PROBIOTIC MULTI-ENZYME PO) Take 1 capsule by mouth daily. Probiotic Multi-Enzyme Digestive Formula   pyridostigmine (MESTINON) 60 MG tablet TAKE 1 TABLET BY MOUTH 3 TIMES DAILY   risperiDONE (RISPERDAL) 1 MG tablet Take 1 mg by mouth at bedtime.    tolterodine (DETROL LA) 4 MG 24 hr capsule TAKE 1 CAPSULE BY MOUTH ONCE EVERY MORNING   vitamin B-12 (CYANOCOBALAMIN) 250 MCG tablet Take 250 mcg by mouth in the morning, at noon, and at bedtime.   vitamin E 180 MG (400 UNITS) capsule Take 400 Units by mouth daily at 2 PM. 1430   No facility-administered encounter medications on file as of 10/29/2022.     Lab Results  Component Value Date   WBC 9.0 09/15/2022   HGB 14.1 09/15/2022   HCT 40.6 09/15/2022   PLT 204 09/15/2022   GLUCOSE 102 (H) 09/15/2022   CHOL 238 (H) 01/08/2022   TRIG 129.0 01/08/2022   HDL 60.60 01/08/2022   LDLDIRECT 122.0 12/14/2018   LDLCALC 152 (H) 01/08/2022   ALT 23 09/15/2022   AST 27 09/15/2022   NA 132 (L) 09/15/2022   K 3.3 (L) 09/15/2022   CL 95  (L) 09/15/2022   CREATININE 0.93 09/15/2022   BUN 9 09/15/2022   CO2 27 09/15/2022   TSH 4.346 04/01/2022   INR 1.1 04/02/2022   HGBA1C 5.6 01/08/2022    CT ABDOMEN PELVIS W CONTRAST  Result Date: 09/15/2022 CLINICAL DATA:  Abdominal pain and chest pain. EXAM: CT ABDOMEN AND PELVIS WITH CONTRAST TECHNIQUE: Multidetector CT imaging of the abdomen and pelvis was performed using the standard protocol following bolus administration of intravenous contrast. RADIATION DOSE REDUCTION: This exam was performed according to the departmental dose-optimization program which includes automated exposure control, adjustment of the mA and/or kV according to patient size and/or use of iterative reconstruction technique. CONTRAST:  OMNIPAQUE IOHEXOL 300 MG/ML  SOLN COMPARISON:  CT pelvis dated 04/07/2022, CT abdomen and pelvis dated 06/15/2019, CT abdomen and pelvis dated 04/02/2022 FINDINGS: Lower chest: Mild scarring/atelectasis at the LEFT lung base. Hepatobiliary: Status post cholecystectomy. Commensurate bile duct ectasia. No focal liver abnormality is seen. Pancreas: Unremarkable. No pancreatic ductal dilatation or surrounding inflammatory changes. Spleen: Normal in size without focal abnormality. Adrenals/Urinary Tract: Adrenal glands are stable, as previously described. t surgical changes of a partial LEFT kidney resection, stable. Kidneys otherwise unremarkable without stone or hydronephrosis. No ureteral or bladder calculi are identified. Punctate focus of air along the nondependent wall of the bladder. Questionable thickening of the anterior walls of the bladder, but bladder is partially decompressed limiting characterization. Stomach/Bowel: No dilated large or small bowel loops. Scattered diverticulosis of the descending and sigmoid colon but no focal inflammatory changes seen to suggest acute diverticulitis. No evidence of a focal or generalized bowel wall inflammation. Stomach is unremarkable. Appendix  is not seen but there are no inflammatory changes about the cecum to suggest  acute appendicitis. Surgical clips in the RIGHT lower quadrant suggest a previous appendectomy. Vascular/Lymphatic: No abdominal aortic aneurysm. No acute-appearing vascular abnormality. No enlarged lymph nodes are seen in the abdomen or pelvis. Reproductive: No adnexal mass or free fluid. Other: No free fluid or abscess collection is seen. No free intraperitoneal air. Musculoskeletal: Advanced degenerative spondylosis of the lumbar spine and scoliosis of the thoracolumbar spine. Severe compression fracture deformity of the L1 vertebral body, new compared to previous CTs but stable compared to the lumbar spine plain film of 08/06/2022. IMPRESSION: 1. Punctate focus of air along the nondependent wall of the bladder, which would be expected if patient has had recent bladder catheterization. 2. Questionable thickening of the anterior walls of the bladder, but bladder is partially decompressed limiting characterization. Recommend correlation with urinalysis to exclude cystitis. 3. No other acute findings within the abdomen or pelvis. No bowel obstruction or evidence of acute bowel inflammation. No evidence of acute solid organ abnormality. No renal or ureteral calculi. No free fluid or abscess collection. 4. Colonic diverticulosis without evidence of acute diverticulitis. 5. Stable surgical changes of a partial LEFT kidney resection. No complicating features. 6. Severe compression fracture deformity of the L1 vertebral body, stable compared to a recent outside lumbar spine plain film of 08/06/2022, but new compared to earlier CTs, indicating subacute to chronic age. Electronically Signed   By: Bary Richard M.D.   On: 09/15/2022 10:33   DG Chest Portable 1 View  Result Date: 09/15/2022 CLINICAL DATA:  Chest pain starting this morning. EXAM: PORTABLE CHEST 1 VIEW COMPARISON:  Chest x-ray dated 04/01/2022 FINDINGS: Study is hypoinspiratory  with crowding of the bilateral perihilar and bibasilar bronchovascular markings. Given the low lung volumes, heart size and mediastinal contours are grossly stable. Questionable mild atelectasis at the LEFT lung base. No pleural effusion or pneumothorax is seen. Osseous structures about the chest are unremarkable. IMPRESSION: Low lung volumes. Questionable mild atelectasis at the LEFT lung base. No evidence of pneumonia or pulmonary edema. Electronically Signed   By: Bary Richard M.D.   On: 09/15/2022 08:49       Assessment & Plan:  There are no diagnoses linked to this encounter.   Dale Pulaski, MD

## 2022-10-30 ENCOUNTER — Encounter: Payer: Self-pay | Admitting: Internal Medicine

## 2022-10-30 NOTE — Progress Notes (Signed)
Patient ID: Madison Burns, female   DOB: 1950/05/11, 72 y.o.   MRN: [id number] No show for appt.

## 2022-11-01 DIAGNOSIS — E119 Type 2 diabetes mellitus without complications: Secondary | ICD-10-CM | POA: Diagnosis not present

## 2022-11-01 DIAGNOSIS — I10 Essential (primary) hypertension: Secondary | ICD-10-CM | POA: Diagnosis not present

## 2022-11-03 DIAGNOSIS — N182 Chronic kidney disease, stage 2 (mild): Secondary | ICD-10-CM | POA: Diagnosis not present

## 2022-11-03 DIAGNOSIS — R600 Localized edema: Secondary | ICD-10-CM | POA: Diagnosis not present

## 2022-11-03 DIAGNOSIS — I480 Paroxysmal atrial fibrillation: Secondary | ICD-10-CM | POA: Diagnosis not present

## 2022-11-12 ENCOUNTER — Ambulatory Visit: Payer: Self-pay

## 2022-11-12 DIAGNOSIS — M25775 Osteophyte, left foot: Secondary | ICD-10-CM | POA: Diagnosis not present

## 2022-11-12 DIAGNOSIS — M79675 Pain in left toe(s): Secondary | ICD-10-CM | POA: Diagnosis not present

## 2022-11-12 DIAGNOSIS — M19072 Primary osteoarthritis, left ankle and foot: Secondary | ICD-10-CM | POA: Diagnosis not present

## 2022-11-12 NOTE — Patient Outreach (Signed)
Care Coordination   11/12/2022 Name: Madison Burns MRN: [id number] DOB: 09-Jun-1950   Care Coordination Outreach Attempts:  An unsuccessful telephone outreach was attempted for a scheduled appointment today. Unable to reach patients sister or leave voice message due to message stating call cannot be completed as dialed.  Attempted call to only listed contact phone number x 2.   Follow Up Plan:  Additional outreach attempts will be made to offer the patient care coordination information and services.   Encounter Outcome:  No Answer   Care Coordination Interventions:  No, not indicated    George Ina [facility] Care Coordination [phone] direct line

## 2022-11-18 DIAGNOSIS — R233 Spontaneous ecchymoses: Secondary | ICD-10-CM | POA: Diagnosis not present

## 2022-12-01 DIAGNOSIS — W07XXXA Fall from chair, initial encounter: Secondary | ICD-10-CM | POA: Diagnosis not present

## 2022-12-01 DIAGNOSIS — M6281 Muscle weakness (generalized): Secondary | ICD-10-CM | POA: Diagnosis not present

## 2022-12-01 DIAGNOSIS — R051 Acute cough: Secondary | ICD-10-CM | POA: Diagnosis not present

## 2022-12-02 DIAGNOSIS — N182 Chronic kidney disease, stage 2 (mild): Secondary | ICD-10-CM | POA: Diagnosis not present

## 2022-12-02 DIAGNOSIS — R070 Pain in throat: Secondary | ICD-10-CM | POA: Diagnosis not present

## 2022-12-02 DIAGNOSIS — R600 Localized edema: Secondary | ICD-10-CM | POA: Diagnosis not present

## 2022-12-02 DIAGNOSIS — I480 Paroxysmal atrial fibrillation: Secondary | ICD-10-CM | POA: Diagnosis not present

## 2022-12-04 DIAGNOSIS — J984 Other disorders of lung: Secondary | ICD-10-CM | POA: Diagnosis not present

## 2022-12-04 DIAGNOSIS — Q791 Other congenital malformations of diaphragm: Secondary | ICD-10-CM | POA: Diagnosis not present

## 2022-12-05 ENCOUNTER — Other Ambulatory Visit: Payer: Self-pay

## 2022-12-05 ENCOUNTER — Inpatient Hospital Stay
Admission: EM | Admit: 2022-12-05 | Discharge: 2022-12-08 | DRG: 190 | Disposition: A | Discharge status: Skilled Nursing Facility | Payer: 59 | Source: Skilled Nursing Facility | Attending: Internal Medicine | Admitting: Internal Medicine

## 2022-12-05 ENCOUNTER — Emergency Department: Payer: 59

## 2022-12-05 DIAGNOSIS — E7800 Pure hypercholesterolemia, unspecified: Secondary | ICD-10-CM | POA: Diagnosis present

## 2022-12-05 DIAGNOSIS — J189 Pneumonia, unspecified organism: Secondary | ICD-10-CM | POA: Diagnosis present

## 2022-12-05 DIAGNOSIS — J9811 Atelectasis: Secondary | ICD-10-CM | POA: Diagnosis not present

## 2022-12-05 DIAGNOSIS — G40909 Epilepsy, unspecified, not intractable, without status epilepticus: Secondary | ICD-10-CM | POA: Diagnosis present

## 2022-12-05 DIAGNOSIS — J441 Chronic obstructive pulmonary disease with (acute) exacerbation: Secondary | ICD-10-CM | POA: Diagnosis not present

## 2022-12-05 DIAGNOSIS — R0603 Acute respiratory distress: Secondary | ICD-10-CM | POA: Diagnosis not present

## 2022-12-05 DIAGNOSIS — E861 Hypovolemia: Secondary | ICD-10-CM | POA: Diagnosis not present

## 2022-12-05 DIAGNOSIS — Z743 Need for continuous supervision: Secondary | ICD-10-CM | POA: Diagnosis not present

## 2022-12-05 DIAGNOSIS — J440 Chronic obstructive pulmonary disease with acute lower respiratory infection: Secondary | ICD-10-CM | POA: Diagnosis not present

## 2022-12-05 DIAGNOSIS — J9621 Acute and chronic respiratory failure with hypoxia: Secondary | ICD-10-CM | POA: Diagnosis present

## 2022-12-05 DIAGNOSIS — K219 Gastro-esophageal reflux disease without esophagitis: Secondary | ICD-10-CM | POA: Diagnosis present

## 2022-12-05 DIAGNOSIS — I480 Paroxysmal atrial fibrillation: Secondary | ICD-10-CM | POA: Diagnosis not present

## 2022-12-05 DIAGNOSIS — Z8249 Family history of ischemic heart disease and other diseases of the circulatory system: Secondary | ICD-10-CM

## 2022-12-05 DIAGNOSIS — I10 Essential (primary) hypertension: Secondary | ICD-10-CM | POA: Diagnosis present

## 2022-12-05 DIAGNOSIS — E039 Hypothyroidism, unspecified: Secondary | ICD-10-CM | POA: Diagnosis present

## 2022-12-05 DIAGNOSIS — Z66 Do not resuscitate: Secondary | ICD-10-CM | POA: Diagnosis present

## 2022-12-05 DIAGNOSIS — Z85820 Personal history of malignant melanoma of skin: Secondary | ICD-10-CM | POA: Diagnosis not present

## 2022-12-05 DIAGNOSIS — R0600 Dyspnea, unspecified: Secondary | ICD-10-CM | POA: Diagnosis present

## 2022-12-05 DIAGNOSIS — Z1152 Encounter for screening for COVID-19: Secondary | ICD-10-CM | POA: Diagnosis not present

## 2022-12-05 DIAGNOSIS — Z79899 Other long term (current) drug therapy: Secondary | ICD-10-CM

## 2022-12-05 DIAGNOSIS — K589 Irritable bowel syndrome without diarrhea: Secondary | ICD-10-CM | POA: Diagnosis present

## 2022-12-05 DIAGNOSIS — R0689 Other abnormalities of breathing: Secondary | ICD-10-CM | POA: Diagnosis not present

## 2022-12-05 DIAGNOSIS — Z9049 Acquired absence of other specified parts of digestive tract: Secondary | ICD-10-CM

## 2022-12-05 DIAGNOSIS — J45901 Unspecified asthma with (acute) exacerbation: Secondary | ICD-10-CM | POA: Diagnosis not present

## 2022-12-05 DIAGNOSIS — G4733 Obstructive sleep apnea (adult) (pediatric): Secondary | ICD-10-CM | POA: Diagnosis present

## 2022-12-05 DIAGNOSIS — Z6835 Body mass index (BMI) 35.0-35.9, adult: Secondary | ICD-10-CM | POA: Diagnosis not present

## 2022-12-05 DIAGNOSIS — Z800 Family history of malignant neoplasm of digestive organs: Secondary | ICD-10-CM

## 2022-12-05 DIAGNOSIS — E871 Hypo-osmolality and hyponatremia: Secondary | ICD-10-CM | POA: Diagnosis present

## 2022-12-05 DIAGNOSIS — J9601 Acute respiratory failure with hypoxia: Principal | ICD-10-CM

## 2022-12-05 DIAGNOSIS — Z79631 Long term (current) use of antimetabolite agent: Secondary | ICD-10-CM

## 2022-12-05 DIAGNOSIS — Z83438 Family history of other disorder of lipoprotein metabolism and other lipidemia: Secondary | ICD-10-CM

## 2022-12-05 DIAGNOSIS — Z808 Family history of malignant neoplasm of other organs or systems: Secondary | ICD-10-CM

## 2022-12-05 DIAGNOSIS — G7000 Myasthenia gravis without (acute) exacerbation: Secondary | ICD-10-CM | POA: Diagnosis not present

## 2022-12-05 DIAGNOSIS — Z79890 Hormone replacement therapy (postmenopausal): Secondary | ICD-10-CM

## 2022-12-05 DIAGNOSIS — Z85528 Personal history of other malignant neoplasm of kidney: Secondary | ICD-10-CM | POA: Diagnosis not present

## 2022-12-05 DIAGNOSIS — Z90710 Acquired absence of both cervix and uterus: Secondary | ICD-10-CM

## 2022-12-05 DIAGNOSIS — J84114 Acute interstitial pneumonitis: Secondary | ICD-10-CM | POA: Diagnosis not present

## 2022-12-05 DIAGNOSIS — R6889 Other general symptoms and signs: Secondary | ICD-10-CM | POA: Diagnosis not present

## 2022-12-05 DIAGNOSIS — Z7401 Bed confinement status: Secondary | ICD-10-CM | POA: Diagnosis not present

## 2022-12-05 DIAGNOSIS — I499 Cardiac arrhythmia, unspecified: Secondary | ICD-10-CM | POA: Diagnosis not present

## 2022-12-05 DIAGNOSIS — R918 Other nonspecific abnormal finding of lung field: Secondary | ICD-10-CM | POA: Diagnosis not present

## 2022-12-05 LAB — CBC WITH DIFFERENTIAL/PLATELET
Abs Immature Granulocytes: 0.06 10*3/uL (ref 0.00–0.07)
Basophils Absolute: 0 10*3/uL (ref 0.0–0.1)
Basophils Relative: 0 %
Eosinophils Absolute: 0 10*3/uL (ref 0.0–0.5)
Eosinophils Relative: 0 %
HCT: 37.2 % (ref 36.0–46.0)
Hemoglobin: 11.9 g/dL — ABNORMAL LOW (ref 12.0–15.0)
Immature Granulocytes: 1 %
Lymphocytes Relative: 4 %
Lymphs Abs: 0.4 10*3/uL — ABNORMAL LOW (ref 0.7–4.0)
MCH: 29.7 pg (ref 26.0–34.0)
MCHC: 32 g/dL (ref 30.0–36.0)
MCV: 92.8 fL (ref 80.0–100.0)
Monocytes Absolute: 0.2 10*3/uL (ref 0.1–1.0)
Monocytes Relative: 2 %
Neutro Abs: 10.5 10*3/uL — ABNORMAL HIGH (ref 1.7–7.7)
Neutrophils Relative %: 93 %
Platelets: 190 10*3/uL (ref 150–400)
RBC: 4.01 MIL/uL (ref 3.87–5.11)
RDW: 15.3 % (ref 11.5–15.5)
WBC: 11.2 10*3/uL — ABNORMAL HIGH (ref 4.0–10.5)
nRBC: 0 % (ref 0.0–0.2)

## 2022-12-05 LAB — COMPREHENSIVE METABOLIC PANEL
ALT: 25 U/L (ref 0–44)
AST: 30 U/L (ref 15–41)
Albumin: 3.4 g/dL — ABNORMAL LOW (ref 3.5–5.0)
Alkaline Phosphatase: 89 U/L (ref 38–126)
Anion gap: 11 (ref 5–15)
BUN: 17 mg/dL (ref 8–23)
CO2: 26 mmol/L (ref 22–32)
Calcium: 8 mg/dL — ABNORMAL LOW (ref 8.9–10.3)
Chloride: 89 mmol/L — ABNORMAL LOW (ref 98–111)
Creatinine, Ser: 0.82 mg/dL (ref 0.44–1.00)
GFR, Estimated: >60 mL/min (ref >60)
Glucose, Bld: 161 mg/dL — ABNORMAL HIGH (ref 70–99)
Potassium: 4.2 mmol/L (ref 3.5–5.1)
Sodium: 126 mmol/L — ABNORMAL LOW (ref 135–145)
Total Bilirubin: 0.5 mg/dL (ref 0.3–1.2)
Total Protein: 6.5 g/dL (ref 6.5–8.1)

## 2022-12-05 LAB — PROTIME-INR
INR: 0.9 (ref 0.8–1.2)
Prothrombin Time: 12.8 s (ref 11.4–15.2)

## 2022-12-05 LAB — TROPONIN I (HIGH SENSITIVITY): Troponin I (High Sensitivity): 8 ng/L (ref <18)

## 2022-12-05 LAB — LACTIC ACID, PLASMA: Lactic Acid, Venous: 1.7 mmol/L (ref 0.5–1.9)

## 2022-12-05 LAB — APTT: aPTT: 24 s (ref 24–36)

## 2022-12-05 MED ORDER — IPRATROPIUM-ALBUTEROL 0.5-2.5 (3) MG/3ML IN SOLN
6.0000 mL | Freq: Once | RESPIRATORY_TRACT | Status: AC
  Administered 2022-12-05: 6 mL via RESPIRATORY_TRACT
  Filled 2022-12-05: qty 6

## 2022-12-05 MED ORDER — SODIUM CHLORIDE 0.9 % IV SOLN
2.0000 g | INTRAVENOUS | Status: DC
  Administered 2022-12-05 – 2022-12-07 (×3): 2 g via INTRAVENOUS
  Filled 2022-12-05 (×3): qty 20

## 2022-12-05 MED ORDER — METHYLPREDNISOLONE SODIUM SUCC 125 MG IJ SOLR
125.0000 mg | Freq: Once | INTRAMUSCULAR | Status: AC
  Administered 2022-12-05: 125 mg via INTRAVENOUS
  Filled 2022-12-05: qty 2

## 2022-12-05 NOTE — ED Provider Notes (Signed)
-----------------------------------------   10:58 PM on 12/05/2022 -----------------------------------------  Assuming care from Dr. Arnoldo Morale.  In short, Madison Burns is a 72 y.o. female with a chief complaint of respiratory distress.  Refer to the original H&P for additional details.  The current plan of care is to follow up on CXR and labs.  Anticipate admission unless patient has substantial clinical improvement despite co-morbidities (myasthenia).   Clinical Course as of 12/06/22 1191  Fri Dec 05, 2022  2352 DG Chest [place] 1 [address] I viewed and interpreted the patient's chest x-ray.  There is some evidence of atelectasis versus infiltrate but it seems unchanged from before. [CF]  2352 Comprehensive metabolic panel notable for moderate hyponatremia, normal renal function.  Very minimal leukocytosis of 11.2.  High-sensitivity troponin is normal and lactic acid is within normal limits.  Coagulation studies also within normal limits. [CF]  2352 , [CF]  Sat Dec 06, 2022  0041 I reassessed the patient.  She says that she feels better but she is using intercostal retractions and accessory muscles.  She has a frequent thick sounding cough and I am somewhat worried about the possibility of aspiration given her history of myasthenia.  She has already been treated with broad-spectrum antibiotics I will not further antibiosis her.  However she is still wheezing and having some respiratory distress and when I talk to her about options, she said she would like to try the BiPAP.  She has tried it before at some point last year.  I called and spoke with B with respiratory therapy who will start her on BiPAP.  I am consulting the hospitalist for admission.  I am also ordering another albuterol 5 mg to be used with the BiPAP. [CF]  0110 Consulted Dr. Arville Care with the hospitalist service who will admit. [CF]    Clinical Course User Index [CF] Loleta Rose, MD   .Critical Care  Performed by: Loleta Rose,  MD Authorized by: Loleta Rose, MD   Critical care provider statement:    Critical care time (minutes):  30   Critical care time was exclusive of:  Separately billable procedures and treating other patients   Critical care was necessary to treat or prevent imminent or life-threatening deterioration of the following conditions:  Respiratory failure   Critical care was time spent personally by me on the following activities:  Development of treatment plan with patient or surrogate, evaluation of patient's response to treatment, examination of patient, obtaining history from patient or surrogate, ordering and performing treatments and interventions, ordering and review of laboratory studies, ordering and review of radiographic studies, pulse oximetry, re-evaluation of patient's condition and review of old charts     Medications  cefTRIAXone (ROCEPHIN) 2 g in sodium chloride 0.9 % 100 mL IVPB (0 g Intravenous Stopped 12/05/22 2357)  ipratropium-albuterol (DUONEB) 0.5-2.5 (3) MG/3ML nebulizer solution 6 mL (6 mLs Nebulization Given 12/05/22 2241)  methylPREDNISolone sodium succinate (SOLU-MEDROL) 125 mg/2 mL injection 125 mg (125 mg Intravenous Given 12/05/22 2324)  albuterol (PROVENTIL) (2.5 MG/3ML) 0.083% nebulizer solution 5 mg (5 mg Nebulization Given 12/06/22 0114)     ED Discharge Orders     None      Final diagnoses:  Moderate asthma with exacerbation, unspecified whether persistent  Hyponatremia  Acute respiratory failure with hypoxia (HCC)  Community acquired pneumonia, unspecified laterality  Myasthenia gravis (HCC)     Loleta Rose, MD 12/06/22 [phone]

## 2022-12-05 NOTE — ED Provider Notes (Signed)
[facility] Provider Note    Event Date/Time   First MD Initiated Contact with Patient 12/05/22 2230     (approximate)   History   Respiratory Distress   HPI  Madison Burns is a 72 y.o. female past medical history significant for atrial fibrillation, myasthenia gravis, epilepsy, who presents to the emergency department shortness of breath.  Patient comes from nursing facility.  States that she has been having progressively worsening shortness of breath.  States that she has been coughing with shortness of breath since Thursday.  Diagnosed with pneumonia and started on doxycycline and prednisone.  Given a DuoNeb treatment and then continued to have increased work of breathing and shortness of breath so she was sent to the emergency department.  Patient with shortness of breath, wheezing and coughing.  Denies any history of DVT or PE.  Denies any falls or trauma.     Physical Exam   Triage Vital Signs: ED Triage Vitals  Encounter Vitals Group     BP      Systolic BP Percentile      Diastolic BP Percentile      Pulse      Resp      Temp      Temp src      SpO2      Weight      Height      Head Circumference      [facility]      Pain Score      Pain Loc      Pain Education      Exclude from Growth Chart     Most recent vital signs: Vitals:   12/05/22 2315 12/05/22 2330  BP:  (!) 147/72  Pulse: 81 81  Resp:    Temp:    SpO2: 96% 96%    Physical Exam Constitutional:      Appearance: She is well-developed.  HENT:     Head: Atraumatic.  Eyes:     Extraocular Movements: Extraocular movements intact.     Conjunctiva/sclera: Conjunctivae normal.     Pupils: Pupils are equal, round, and reactive to light.  Cardiovascular:     Rate and Rhythm: Regular rhythm.  Pulmonary:     Effort: No respiratory distress.     Breath sounds: Wheezing present.  Abdominal:     General: There is no distension.     Tenderness: There is no abdominal  tenderness.  Musculoskeletal:        General: Normal range of motion.     Cervical back: Normal range of motion.     Right lower leg: No edema.     Left lower leg: No edema.  Skin:    General: Skin is warm.     Capillary Refill: Capillary refill takes less than 2 seconds.  Neurological:     Mental Status: She is alert. Mental status is at baseline.     IMPRESSION / MDM / ASSESSMENT AND PLAN / ED COURSE  I reviewed the triage vital signs and the nursing notes.  On arrival blood cultures obtained given known diagnosis of pneumonia, per report had already received doxycycline, given a dose of IV Rocephin.  Differential diagnosis including asthma, pneumonia, COVID, heart failure  EKG  I, Corena Herter, the attending physician, personally viewed and interpreted this ECG.   Rate: Normal  Rhythm: Normal sinus  Axis: Normal  Intervals: Normal  ST&T Change: None No change when compared to prior EKG  No tachycardic or bradycardic dysrhythmias while on cardiac telemetry.  RADIOLOGY   LABS (all labs ordered are listed, but only abnormal results are displayed) Labs interpreted as -    Labs Reviewed  COMPREHENSIVE METABOLIC PANEL - Abnormal; Notable for the following components:      Result Value   Sodium 126 (*)    Chloride 89 (*)    Glucose, Bld 161 (*)    Calcium 8.0 (*)    Albumin 3.4 (*)    All other components within normal limits  CBC WITH DIFFERENTIAL/PLATELET - Abnormal; Notable for the following components:   WBC 11.2 (*)    Hemoglobin 11.9 (*)    Neutro Abs 10.5 (*)    Lymphs Abs 0.4 (*)    All other components within normal limits  CULTURE, BLOOD (ROUTINE X 2)  CULTURE, BLOOD (ROUTINE X 2)  RESP PANEL BY RT-PCR (RSV, FLU A&B, COVID)  RVPGX2  LACTIC ACID, PLASMA  PROTIME-INR  APTT  BRAIN NATRIURETIC PEPTIDE  TROPONIN I (HIGH SENSITIVITY)     MDM  Lab work currently in process.  Patient given DuoNeb treatment.  Given IV Solu-Medrol given significant  wheezing and respiratory distress.  Currently stable on room air.  Lab work currently pending.  Chest x-ray read as no significant change when compared to prior.  PROCEDURES:  Critical Care performed: yes  .Critical Care  Performed by: Corena Herter, MD Authorized by: Corena Herter, MD   Critical care provider statement:    Critical care time (minutes):  35   Critical care time was exclusive of:  Separately billable procedures and treating other patients   Critical care was necessary to treat or prevent imminent or life-threatening deterioration of the following conditions:  Respiratory failure and metabolic crisis   Critical care was time spent personally by me on the following activities:  Development of treatment plan with patient or surrogate, discussions with consultants, evaluation of patient's response to treatment, examination of patient, ordering and review of laboratory studies, ordering and review of radiographic studies, ordering and performing treatments and interventions, pulse oximetry, re-evaluation of patient's condition and review of old charts   Care discussed with: admitting provider     Patient's presentation is most consistent with acute presentation with potential threat to life or bodily function.   MEDICATIONS ORDERED IN ED: Medications  cefTRIAXone (ROCEPHIN) 2 g in sodium chloride 0.9 % 100 mL IVPB (0 g Intravenous Stopped 12/05/22 2357)  ipratropium-albuterol (DUONEB) 0.5-2.5 (3) MG/3ML nebulizer solution 6 mL (6 mLs Nebulization Given 12/05/22 2241)  methylPREDNISolone sodium succinate (SOLU-MEDROL) 125 mg/2 mL injection 125 mg (125 mg Intravenous Given 12/05/22 2324)    FINAL CLINICAL IMPRESSION(S) / ED DIAGNOSES   Final diagnoses:  Moderate asthma with exacerbation, unspecified whether persistent  Hyponatremia     Rx / DC Orders   ED Discharge Orders     None        Note:  This document was prepared using Dragon voice recognition software  and may include unintentional dictation errors.   Corena Herter, MD 12/06/22 [phone]

## 2022-12-05 NOTE — ED Triage Notes (Signed)
Patient arrives to ER via [facility] from [facility] for "respiratory distress." Per [facility], patient was diagnosed today with pneumonia and given doxycycline and prednisone. Ems states that patient's oxygen per facility was dropping and was placed on 2L oxygen and gave a duoneb, but patient was only maintaining in the low 90s. Ems states they arrived and patient was 80 RA and gave a duoneb due to wheezing throughout.   Auditory wheezes noted. Resp labored. Skin pwd.

## 2022-12-06 DIAGNOSIS — Z1152 Encounter for screening for COVID-19: Secondary | ICD-10-CM | POA: Diagnosis not present

## 2022-12-06 DIAGNOSIS — J9621 Acute and chronic respiratory failure with hypoxia: Secondary | ICD-10-CM | POA: Diagnosis present

## 2022-12-06 DIAGNOSIS — Z66 Do not resuscitate: Secondary | ICD-10-CM | POA: Diagnosis present

## 2022-12-06 DIAGNOSIS — I480 Paroxysmal atrial fibrillation: Secondary | ICD-10-CM | POA: Diagnosis present

## 2022-12-06 DIAGNOSIS — K219 Gastro-esophageal reflux disease without esophagitis: Secondary | ICD-10-CM | POA: Diagnosis present

## 2022-12-06 DIAGNOSIS — Z85820 Personal history of malignant melanoma of skin: Secondary | ICD-10-CM | POA: Diagnosis not present

## 2022-12-06 DIAGNOSIS — Z6835 Body mass index (BMI) 35.0-35.9, adult: Secondary | ICD-10-CM | POA: Diagnosis not present

## 2022-12-06 DIAGNOSIS — R0600 Dyspnea, unspecified: Secondary | ICD-10-CM | POA: Diagnosis present

## 2022-12-06 DIAGNOSIS — J441 Chronic obstructive pulmonary disease with (acute) exacerbation: Secondary | ICD-10-CM | POA: Diagnosis present

## 2022-12-06 DIAGNOSIS — J440 Chronic obstructive pulmonary disease with acute lower respiratory infection: Secondary | ICD-10-CM | POA: Diagnosis present

## 2022-12-06 DIAGNOSIS — Z85528 Personal history of other malignant neoplasm of kidney: Secondary | ICD-10-CM | POA: Diagnosis not present

## 2022-12-06 DIAGNOSIS — G4733 Obstructive sleep apnea (adult) (pediatric): Secondary | ICD-10-CM | POA: Diagnosis present

## 2022-12-06 DIAGNOSIS — E871 Hypo-osmolality and hyponatremia: Secondary | ICD-10-CM | POA: Diagnosis present

## 2022-12-06 DIAGNOSIS — Z79890 Hormone replacement therapy (postmenopausal): Secondary | ICD-10-CM | POA: Diagnosis not present

## 2022-12-06 DIAGNOSIS — E861 Hypovolemia: Secondary | ICD-10-CM | POA: Diagnosis present

## 2022-12-06 DIAGNOSIS — J9601 Acute respiratory failure with hypoxia: Principal | ICD-10-CM

## 2022-12-06 DIAGNOSIS — I10 Essential (primary) hypertension: Secondary | ICD-10-CM | POA: Diagnosis present

## 2022-12-06 DIAGNOSIS — Z8249 Family history of ischemic heart disease and other diseases of the circulatory system: Secondary | ICD-10-CM | POA: Diagnosis not present

## 2022-12-06 DIAGNOSIS — G7000 Myasthenia gravis without (acute) exacerbation: Secondary | ICD-10-CM | POA: Diagnosis present

## 2022-12-06 DIAGNOSIS — J189 Pneumonia, unspecified organism: Secondary | ICD-10-CM | POA: Diagnosis present

## 2022-12-06 DIAGNOSIS — G40909 Epilepsy, unspecified, not intractable, without status epilepticus: Secondary | ICD-10-CM | POA: Diagnosis present

## 2022-12-06 DIAGNOSIS — J45901 Unspecified asthma with (acute) exacerbation: Secondary | ICD-10-CM | POA: Diagnosis present

## 2022-12-06 DIAGNOSIS — J9811 Atelectasis: Secondary | ICD-10-CM | POA: Diagnosis present

## 2022-12-06 DIAGNOSIS — E7800 Pure hypercholesterolemia, unspecified: Secondary | ICD-10-CM | POA: Diagnosis present

## 2022-12-06 DIAGNOSIS — E039 Hypothyroidism, unspecified: Secondary | ICD-10-CM | POA: Diagnosis present

## 2022-12-06 LAB — CBC
HCT: 34.5 % — ABNORMAL LOW (ref 36.0–46.0)
Hemoglobin: 11.4 g/dL — ABNORMAL LOW (ref 12.0–15.0)
MCH: 30 pg (ref 26.0–34.0)
MCHC: 33 g/dL (ref 30.0–36.0)
MCV: 90.8 fL (ref 80.0–100.0)
Platelets: 189 10*3/uL (ref 150–400)
RBC: 3.8 MIL/uL — ABNORMAL LOW (ref 3.87–5.11)
RDW: 15.1 % (ref 11.5–15.5)
WBC: 10 10*3/uL (ref 4.0–10.5)
nRBC: 0 % (ref 0.0–0.2)

## 2022-12-06 LAB — RESP PANEL BY RT-PCR (RSV, FLU A&B, COVID)  RVPGX2
Influenza A by PCR: NEGATIVE
Influenza B by PCR: NEGATIVE
Resp Syncytial Virus by PCR: NEGATIVE
SARS Coronavirus 2 by RT PCR: NEGATIVE

## 2022-12-06 LAB — BASIC METABOLIC PANEL
Anion gap: 11 (ref 5–15)
BUN: 16 mg/dL (ref 8–23)
CO2: 25 mmol/L (ref 22–32)
Calcium: 8 mg/dL — ABNORMAL LOW (ref 8.9–10.3)
Chloride: 90 mmol/L — ABNORMAL LOW (ref 98–111)
Creatinine, Ser: 0.81 mg/dL (ref 0.44–1.00)
GFR, Estimated: >60 mL/min (ref >60)
Glucose, Bld: 171 mg/dL — ABNORMAL HIGH (ref 70–99)
Potassium: 4.4 mmol/L (ref 3.5–5.1)
Sodium: 126 mmol/L — ABNORMAL LOW (ref 135–145)

## 2022-12-06 LAB — BRAIN NATRIURETIC PEPTIDE: B Natriuretic Peptide: 46.6 pg/mL (ref 0.0–100.0)

## 2022-12-06 MED ORDER — PREDNISONE 20 MG PO TABS
40.0000 mg | ORAL_TABLET | Freq: Every day | ORAL | Status: DC
  Administered 2022-12-06 – 2022-12-08 (×3): 40 mg via ORAL
  Filled 2022-12-06 (×4): qty 2

## 2022-12-06 MED ORDER — TRAZODONE HCL 50 MG PO TABS
25.0000 mg | ORAL_TABLET | Freq: Every evening | ORAL | Status: DC | PRN
  Administered 2022-12-06 – 2022-12-07 (×2): 25 mg via ORAL
  Filled 2022-12-06 (×2): qty 1

## 2022-12-06 MED ORDER — DULOXETINE HCL 30 MG PO CPEP
30.0000 mg | ORAL_CAPSULE | Freq: Every day | ORAL | Status: DC
  Administered 2022-12-06 – 2022-12-08 (×3): 30 mg via ORAL
  Filled 2022-12-06 (×3): qty 1

## 2022-12-06 MED ORDER — BENZONATATE 100 MG PO CAPS
100.0000 mg | ORAL_CAPSULE | Freq: Two times a day (BID) | ORAL | Status: DC | PRN
  Administered 2022-12-06 – 2022-12-07 (×2): 100 mg via ORAL
  Filled 2022-12-06 (×2): qty 1

## 2022-12-06 MED ORDER — PANTOPRAZOLE SODIUM 40 MG PO TBEC
40.0000 mg | DELAYED_RELEASE_TABLET | Freq: Every day | ORAL | Status: DC
  Administered 2022-12-06 – 2022-12-08 (×3): 40 mg via ORAL
  Filled 2022-12-06 (×3): qty 1

## 2022-12-06 MED ORDER — VITAMIN E 45 MG (100 UNIT) PO CAPS
400.0000 [IU] | ORAL_CAPSULE | Freq: Every evening | ORAL | Status: DC
  Administered 2022-12-06 – 2022-12-07 (×2): 400 [IU] via ORAL
  Filled 2022-12-06 (×3): qty 4

## 2022-12-06 MED ORDER — VITAMIN C 500 MG PO TABS
1000.0000 mg | ORAL_TABLET | Freq: Every day | ORAL | Status: DC
  Administered 2022-12-06 – 2022-12-07 (×2): 1000 mg via ORAL
  Filled 2022-12-06 (×3): qty 2

## 2022-12-06 MED ORDER — FESOTERODINE FUMARATE ER 4 MG PO TB24
4.0000 mg | ORAL_TABLET | Freq: Every day | ORAL | Status: DC
  Administered 2022-12-06 – 2022-12-08 (×3): 4 mg via ORAL
  Filled 2022-12-06 (×3): qty 1

## 2022-12-06 MED ORDER — B COMPLEX-C PO TABS
1.0000 | ORAL_TABLET | Freq: Every evening | ORAL | Status: DC
  Administered 2022-12-06 – 2022-12-07 (×2): 1 via ORAL
  Filled 2022-12-06 (×3): qty 1

## 2022-12-06 MED ORDER — ONDANSETRON HCL 4 MG/2ML IJ SOLN: 4.0000 mg | Freq: Four times a day (QID) | INTRAMUSCULAR | Status: DC | PRN

## 2022-12-06 MED ORDER — METHOTREXATE SODIUM 2.5 MG PO TABS
10.0000 mg | ORAL_TABLET | ORAL | Status: DC
  Administered 2022-12-08: 10 mg via ORAL
  Filled 2022-12-06: qty 4

## 2022-12-06 MED ORDER — LEVOTHYROXINE SODIUM 50 MCG PO TABS
75.0000 ug | ORAL_TABLET | Freq: Every day | ORAL | Status: DC
  Administered 2022-12-07 – 2022-12-08 (×2): 75 ug via ORAL
  Filled 2022-12-06: qty 1
  Filled 2022-12-06: qty 2
  Filled 2022-12-06: qty 1

## 2022-12-06 MED ORDER — SODIUM CHLORIDE 0.9 % IV SOLN: INTRAVENOUS | Status: DC

## 2022-12-06 MED ORDER — DESMOPRESSIN ACETATE SPRAY 0.01 % NA SOLN
10.0000 ug | Freq: Two times a day (BID) | NASAL | Status: DC
  Administered 2022-12-06 – 2022-12-08 (×3): 10 ug via NASAL
  Filled 2022-12-06 (×3): qty 5

## 2022-12-06 MED ORDER — ONDANSETRON HCL 4 MG PO TABS: 4.0000 mg | ORAL_TABLET | Freq: Four times a day (QID) | ORAL | Status: DC | PRN

## 2022-12-06 MED ORDER — SODIUM CHLORIDE 0.9 % IV SOLN: 1.0000 g | INTRAVENOUS | Status: DC

## 2022-12-06 MED ORDER — ACETAMINOPHEN 325 MG PO TABS: 650.0000 mg | ORAL_TABLET | Freq: Four times a day (QID) | ORAL | Status: DC | PRN

## 2022-12-06 MED ORDER — ACETAMINOPHEN 650 MG RE SUPP: 650.0000 mg | Freq: Four times a day (QID) | RECTAL | Status: DC | PRN

## 2022-12-06 MED ORDER — DILTIAZEM HCL ER COATED BEADS 180 MG PO CP24
180.0000 mg | ORAL_CAPSULE | Freq: Every day | ORAL | Status: DC
  Administered 2022-12-06 – 2022-12-07 (×2): 180 mg via ORAL
  Filled 2022-12-06 (×4): qty 1

## 2022-12-06 MED ORDER — BIOTIN 10000 MCG PO TABS: 10.0000 mg | ORAL_TABLET | Freq: Every day | ORAL | Status: DC

## 2022-12-06 MED ORDER — FOLIC ACID 1 MG PO TABS
2.0000 mg | ORAL_TABLET | Freq: Every day | ORAL | Status: DC
  Administered 2022-12-06 – 2022-12-08 (×3): 2 mg via ORAL
  Filled 2022-12-06 (×3): qty 2

## 2022-12-06 MED ORDER — CYANOCOBALAMIN 500 MCG PO TABS
250.0000 ug | ORAL_TABLET | Freq: Every day | ORAL | Status: DC
  Administered 2022-12-06 – 2022-12-08 (×3): 250 ug via ORAL
  Filled 2022-12-06 (×3): qty 1

## 2022-12-06 MED ORDER — RISAQUAD PO CAPS
1.0000 | ORAL_CAPSULE | Freq: Every day | ORAL | Status: DC
  Administered 2022-12-06 – 2022-12-08 (×3): 1 via ORAL
  Filled 2022-12-06 (×3): qty 1

## 2022-12-06 MED ORDER — ENOXAPARIN SODIUM 60 MG/0.6ML IJ SOSY
50.0000 mg | PREFILLED_SYRINGE | INTRAMUSCULAR | Status: DC
  Administered 2022-12-06 – 2022-12-08 (×3): 50 mg via SUBCUTANEOUS
  Filled 2022-12-06 (×3): qty 0.6

## 2022-12-06 MED ORDER — SODIUM CHLORIDE 0.9 % IV SOLN
500.0000 mg | INTRAVENOUS | Status: DC
  Administered 2022-12-06: 500 mg via INTRAVENOUS
  Filled 2022-12-06: qty 5

## 2022-12-06 MED ORDER — ALBUTEROL SULFATE (2.5 MG/3ML) 0.083% IN NEBU
5.0000 mg | INHALATION_SOLUTION | Freq: Once | RESPIRATORY_TRACT | Status: AC
  Administered 2022-12-06: 5 mg via RESPIRATORY_TRACT
  Filled 2022-12-06: qty 6

## 2022-12-06 MED ORDER — OYSTER SHELL CALCIUM/D3 500-5 MG-MCG PO TABS
1.0000 | ORAL_TABLET | Freq: Every evening | ORAL | Status: DC
  Administered 2022-12-06 – 2022-12-07 (×2): 1 via ORAL
  Filled 2022-12-06 (×2): qty 1

## 2022-12-06 MED ORDER — PYRIDOSTIGMINE BROMIDE 60 MG PO TABS
60.0000 mg | ORAL_TABLET | Freq: Three times a day (TID) | ORAL | Status: DC
  Administered 2022-12-06 – 2022-12-08 (×7): 60 mg via ORAL
  Filled 2022-12-06 (×8): qty 1

## 2022-12-06 MED ORDER — MAGNESIUM HYDROXIDE 400 MG/5ML PO SUSP: 30.0000 mL | Freq: Every day | ORAL | Status: DC | PRN

## 2022-12-06 MED ORDER — AMIODARONE HCL 200 MG PO TABS
200.0000 mg | ORAL_TABLET | Freq: Every day | ORAL | Status: DC
  Administered 2022-12-06 – 2022-12-08 (×3): 200 mg via ORAL
  Filled 2022-12-06 (×3): qty 1

## 2022-12-06 MED ORDER — RISPERIDONE 1 MG PO TABS
1.0000 mg | ORAL_TABLET | Freq: Every day | ORAL | Status: DC
  Administered 2022-12-06 – 2022-12-07 (×2): 1 mg via ORAL
  Filled 2022-12-06 (×2): qty 1

## 2022-12-06 NOTE — ED Notes (Signed)
Pt resting comfortably with eyes closed at this time. Respirations even and unlabored. NAD

## 2022-12-06 NOTE — Assessment & Plan Note (Signed)
-   The patient will be admitted to a progressive unit bed. - This could be possibly related to community-acquired pneumonia though chest x-ray look actually better than previous study. - We will place the patient IV steroid therapy with IV Solu-Medrol as well as nebulized bronchodilator therapy with duonebs q.i.d. and q.4 hours p.r.n.[name] - Mucolytic therapy will be provided with Mucinex and antibiotic therapy with IV Rocephin and Zithromax. - O2 protocol will be followed.

## 2022-12-06 NOTE — Assessment & Plan Note (Addendum)
-   The patient will be hydrated with IV normal saline and will follow BMP. - This likely hypovolemic.

## 2022-12-06 NOTE — Hospital Course (Signed)
This is a nonbillable note.  Please see his history and physical by Dr. Luane School is a 72 y.o. female with medical history significant for hypertension, dyslipidemia, hypothyroidism, GERD, and OSA, who presented to the emergency room with acute onset of worsening dyspnea with associated cough productive of yellowish sputum as well as wheezing over the last couple of days.  Patient seen and examined, will continue steroids, Rocephin, discontinue Zithromax as patient has myasthenia gravis.   Patient condition has improved today, feels back to baseline after giving bronchodilator.  Medically stable for discharge to long-term care facility.  Patient also has sleep apnea, she has CPAP machine at home, has not been using [facility] in [facility].  She was on it for last 2 nights, discussed with TOC, will let family to bring CPAP machine to the facility.

## 2022-12-06 NOTE — Assessment & Plan Note (Signed)
-  The patient was placed on BiPAPPap needed due to30 - We will taper off BiPAP as tolerated.

## 2022-12-06 NOTE — Assessment & Plan Note (Addendum)
-   We will continue amiodarone and Cardizem CD.

## 2022-12-06 NOTE — Assessment & Plan Note (Signed)
-   We will continue Synthroid. 

## 2022-12-06 NOTE — H&P (Addendum)
[place]   PATIENT NAME: Javonni Delis    MR#:  [id number]  DATE OF BIRTH:  1950/07/06  DATE OF ADMISSION:  12/05/2022  PRIMARY CARE PHYSICIAN: Dale Hemingway, MD   Patient is coming from: Home  REQUESTING/REFERRING PHYSICIAN: Loleta Rose, MD  CHIEF COMPLAINT:   Chief Complaint  Patient presents with   Respiratory Distress    HISTORY OF PRESENT ILLNESS:  JASON HERBST is a 72 y.o. female with medical history significant for hypertension, dyslipidemia, hypothyroidism, GERD, and OSA, who presented to the emergency room with acute onset of worsening dyspnea with associated cough productive of yellowish sputum as well as wheezing over the last couple of days.  She admitted to subjective fever with chills yesterday.  No nausea or vomiting or abdominal pain.  No chest pain or palpitations.  No dysuria, oliguria or hematuria or flank pain.  No bleeding diathesis.  ED Course: When she came to the ER blood pressure was 156/84 with otherwise normal vital signs.  Labs revealed hyponatremia of 126 with hypochloremia of 89 blood glucose of 161 with calcium of 8 and LFTs were unremarkable except for albumin of 3.4.  CBC showed mild leukocytosis 11.9 with neutrophilia.  Coag profile was normal.  Respiratory panel came back negative.  Blood cultures were drawn. EKG as reviewed by me : EKG showed normal sinus rhythm with a rate of 90 with low voltage QRS. Imaging: Portable chest x-ray showed infiltration or atelectasis in the lung bases greater on the left similar to prior study.  The patient was given 125 mg IV Solu-Medrol, DuoNeb and albuterol nebulizer 5 mg.  She will be admitted to a progressive unit bed for further evaluation and management. PAST MEDICAL HISTORY:   Past Medical History:  Diagnosis Date   Cancer of kidney (HCC) 2021   Complication of anesthesia    Myasthenia gravis   GERD (gastroesophageal reflux disease)    History of seizure disorder     Hypercholesterolemia    Hypertension    no longer has it. wt loss   Hypokalemia 04/10/2022   Hyponatremia 04/10/2022   Hypothyroidism    Irritable bowel syndrome    Lactic acidosis 04/10/2022   Melanoma (HCC) 2017   Melanoma on neck   Myasthenia gravis (HCC) 2011   OSA on CPAP    Thymoma, malignant (HCC)     PAST SURGICAL HISTORY:   Past Surgical History:  Procedure Laterality Date   ABDOMINAL HYSTERECTOMY  1990   APPENDECTOMY  1999   CHOLECYSTECTOMY  2013   COLONOSCOPY WITH PROPOFOL N/A 06/30/2017   Procedure: COLONOSCOPY WITH PROPOFOL;  Surgeon: Toledo, Boykin Nearing, MD;  Location: [facility];  Service: Gastroenterology;  Laterality: N/A;   CYSTOSCOPY W/ URETERAL STENT PLACEMENT Left 08/17/2019   Procedure: CYSTOSCOPY WITH RETROGRADE PYELOGRAM/URETERAL STENT PLACEMENT;  Surgeon: Sebastian Ache, MD;  Location: [facility];  Service: Urology;  Laterality: Left;  30 MINS   ESOPHAGOGASTRODUODENOSCOPY (EGD) WITH PROPOFOL N/A 06/30/2017   Procedure: ESOPHAGOGASTRODUODENOSCOPY (EGD) WITH PROPOFOL;  Surgeon: Toledo, Boykin Nearing, MD;  Location: [facility];  Service: Gastroenterology;  Laterality: N/A;   IR RADIOLOGIST EVAL & MGMT  06/28/2019   IR RADIOLOGIST EVAL & MGMT  09/14/2019   IR RADIOLOGIST EVAL & MGMT  03/28/2020   IR RADIOLOGIST EVAL & MGMT  10/10/2020   IR RADIOLOGIST EVAL & MGMT  11/22/2021   RADIOFREQUENCY ABLATION Left 08/17/2019   Procedure: LEFT RENAL CRYO ABLATION;  Surgeon: Malachy Moan, MD;  Location: [facility];  Service: Anesthesiology;  Laterality: Left;   TONSILLECTOMY  1959    SOCIAL HISTORY:   Social History   Tobacco Use   Smoking status: Never   Smokeless tobacco: Never  Substance Use Topics   Alcohol use: No    Alcohol/week: 0.0 standard drinks of alcohol    FAMILY HISTORY:   Family History  Problem Relation Age of Onset   Colon cancer Maternal Grandfather        stomach/colon   Heart disease Father        myocardial infarction   Hyperlipidemia  Father    Hyperlipidemia Sister    Diabetes Sister    Bone cancer Other        cousin   Alzheimer's disease Mother        maternal aunts   Skin cancer Mother    Myasthenia gravis Brother        ocular   Skin cancer Brother    Stroke Paternal Grandfather    Breast cancer Neg Hx     DRUG ALLERGIES:  No Known Allergies  REVIEW OF SYSTEMS:   ROS As per history of present illness. All pertinent systems were reviewed above. Constitutional, HEENT, cardiovascular, respiratory, GI, GU, musculoskeletal, neuro, psychiatric, endocrine, integumentary and hematologic systems were reviewed and are otherwise negative/unremarkable except for positive findings mentioned above in the HPI.   MEDICATIONS AT HOME:   Prior to Admission medications   Medication Sig Start Date End Date Taking? Authorizing Provider  acetaminophen (TYLENOL) 325 MG tablet Take 2 tablets (650 mg total) by mouth every 6 (six) hours as needed for mild pain (or Fever >/= 101). 04/13/20   Lonia Blood, MD  amiodarone (PACERONE) 200 MG tablet Take 1 tablet (200 mg total) by mouth daily. 04/14/20   Lonia Blood, MD  Ascorbic Acid (VITAMIN C) 1000 MG tablet Take 1,000 mg by mouth at bedtime.    [provider]  B Complex-C (B-COMPLEX WITH VITAMIN C) tablet Take 1 tablet by mouth every evening.    [provider]  benzonatate (TESSALON) 100 MG capsule TAKE 1 CAPSULE BY MOUTH TWICE DAILY AS NEEDED FOR COUGH 08/13/22   Dale Walton, MD  Biotin 16109 MCG TABS Take 10,000 mcg by mouth daily at 2 PM. (1430)    [provider]  Calcium-Phosphorus-Vitamin D (CITRACAL +D3 PO) Take 1 tablet by mouth daily.    [provider]  cephALEXin (KEFLEX) 500 MG capsule Take 1 capsule (500 mg total) by mouth 2 (two) times daily. 09/15/22   Sharman Cheek, MD  desmopressin (DDAVP NASAL) 0.01 % solution Place 1 spray (10 mcg total) into the nose 2 (two) times daily. 09/04/21   Dohmeier, Porfirio Mylar, MD   diltiazem (CARDIZEM CD) 180 MG 24 hr capsule Take 180 mg by mouth daily. 12/04/21   [provider]  DULoxetine (CYMBALTA) 30 MG capsule Take 1 capsule (30 mg total) by mouth daily. 08/14/22   Dohmeier, Porfirio Mylar, MD  folic acid (FOLVITE) 1 MG tablet TAKE 2 TABLETS BY MOUTH ONCE DAILY 01/14/22   Lomax, Amy, NP  Garlic 1000 MG CAPS Take 1,000 mg by mouth in the morning, at noon, and at bedtime. (0800, 1430, 2000)    [provider]  levothyroxine (SYNTHROID) 75 MCG tablet TAKE 1 TABLET BY MOUTH ONCE DAILY ON AN EMPTY STOMACH. WAIT 30 MINUTES BEFORE TAKING OTHER MEDS. 05/23/22   Dale Bagdad, MD  magnesium hydroxide (MILK OF MAGNESIA) 400 MG/5ML suspension Take 30 mLs by mouth daily as  needed for mild constipation. 04/10/22   Pennie Banter, DO  methotrexate (RHEUMATREX) 2.5 MG tablet TAKE 4 TABLETS BY MOUTH ONCE A WEEK WITHFOLIC ACID 05/14/22   Dohmeier, Porfirio Mylar, MD  pantoprazole (PROTONIX) 40 MG tablet Take 40 mg by mouth daily.  11/24/17   [provider]  Probiotic Product (PROBIOTIC MULTI-ENZYME PO) Take 1 capsule by mouth daily. Probiotic Multi-Enzyme Digestive Formula    [provider]  pyridostigmine (MESTINON) 60 MG tablet TAKE 1 TABLET BY MOUTH 3 TIMES DAILY 09/04/22   Dohmeier, Porfirio Mylar, MD  risperiDONE (RISPERDAL) 1 MG tablet Take 1 mg by mouth at bedtime.  07/08/18   [provider]  tolterodine (DETROL LA) 4 MG 24 hr capsule TAKE 1 CAPSULE BY MOUTH ONCE EVERY MORNING 05/23/22   Dale Champion, MD  vitamin B-12 (CYANOCOBALAMIN) 250 MCG tablet Take 250 mcg by mouth in the morning, at noon, and at bedtime.    [provider]  vitamin E 180 MG (400 UNITS) capsule Take 400 Units by mouth daily at 2 PM. 1430    [provider]      VITAL SIGNS:  Blood pressure 130/75, pulse 79, temperature 98 F (36.7 C), temperature source Oral, resp. rate 16, height 5\' 7"  (1.702 m), weight 103.4 kg, SpO2 98%.  PHYSICAL EXAMINATION:  Physical  Exam  GENERAL: Acutely ill 72 y.o.-year-old patient lying in the bed with no significant distress on BiPAP. EYES: Pupils equal, round, reactive to light and accommodation. No scleral icterus. Extraocular muscles intact.  HEENT: Head atraumatic, normocephalic. Oropharynx and nasopharynx clear.  NECK:  Supple, no jugular venous distention. No thyroid enlargement, no tenderness.  LUNGS: Diminished bibasal breath sounds with residual expiratory wheezes with diminished expiratory airflow.  No use of accessory muscles of respiration.  CARDIOVASCULAR: Regular rate and rhythm, S1, S2 normal. No murmurs, rubs, or gallops.  ABDOMEN: Soft, nondistended, nontender. Bowel sounds present. No organomegaly or mass.  EXTREMITIES: No pedal edema, cyanosis, or clubbing.  NEUROLOGIC: Cranial nerves II through XII are intact. Muscle strength 5/5 in all extremities. Sensation intact. Gait not checked.  PSYCHIATRIC: The patient is alert and oriented x 3.  Normal affect and good eye contact. SKIN: No obvious rash, lesion, or ulcer.   LABORATORY PANEL:   CBC Recent Labs  Lab 12/06/22 0516  WBC 10.0  HGB 11.4*  HCT 34.5*  PLT 189   ------------------------------------------------------------------------------------------------------------------  Chemistries  Recent Labs  Lab 12/05/22 2315 12/06/22 0516  NA 126* 126*  K 4.2 4.4  CL 89* 90*  CO2 26 25  GLUCOSE 161* 171*  BUN 17 16  CREATININE 0.82 0.81  CALCIUM 8.0* 8.0*  AST 30  --   ALT 25  --   ALKPHOS 89  --   BILITOT 0.5  --    ------------------------------------------------------------------------------------------------------------------  Cardiac Enzymes No results for input(s): "TROPONINI" in the last 168 hours. ------------------------------------------------------------------------------------------------------------------  RADIOLOGY:  DG Chest Port 1 View  Result Date: 12/05/2022 CLINICAL DATA:  Question of sepsis to evaluate  for abnormality. Respiratory distress. Diagnosed today with pneumonia. Oxygen saturations are decreasing despite treatment. EXAM: PORTABLE CHEST 1 VIEW COMPARISON:  09/15/2022 FINDINGS: Shallow inspiration with infiltration or atelectasis in the lung bases, greater on the left. Appearance is similar to prior study. No pleural effusions. No pneumothorax. Mediastinal contours appear intact. Scarring in the right upper lung. Normal heart size. IMPRESSION: Infiltration or atelectasis in the lung bases, greater on the left. Similar appearance to prior study. Electronically Signed   By: Chrissie Noa  Andria Meuse M.D.   On: 12/05/2022 23:20      IMPRESSION AND PLAN:  Assessment and Plan: * COPD exacerbation (HCC) - The patient will be admitted to a progressive unit bed. - This could be possibly related to community-acquired pneumonia though chest x-ray look actually better than previous study. - We will place the patient IV steroid therapy with IV Solu-Medrol as well as nebulized bronchodilator therapy with duonebs q.i.d. and q.4 hours p.r.n.[name] - Mucolytic therapy will be provided with Mucinex and antibiotic therapy with IV Rocephin and Zithromax. - O2 protocol will be followed.   Acute respiratory failure with hypoxia (HCC)  -The patient was placed on BiPAPPap needed due to30 - We will taper off BiPAP as tolerated.  Hyponatremia - The patient will be hydrated with IV normal saline and will follow BMP. - This likely hypovolemic.  Paroxysmal atrial fibrillation (HCC) - We will continue amiodarone and Cardizem CD.  Hypothyroidism - We will continue Synthroid.  Myasthenia gravis (HCC) - We will continue her pyridostigmine       DVT prophylaxis: Lovenox. Advanced Care Planning:  Code Status: She is DNR only. Family Communication:  The plan of care was discussed in details with the patient (and family). I answered all questions. The patient agreed to proceed with the above mentioned plan. Further  management will depend upon hospital course. Disposition Plan: Back to previous home environment Consults called: none. All the records are reviewed and case discussed with ED provider.  Status is: Inpatient    At the time of the admission, it appears that the appropriate admission status for this patient is inpatient.  This is judged to be reasonable and necessary in order to provide the required intensity of service to ensure the patient's safety given the presenting symptoms, physical exam findings and initial radiographic and laboratory data in the context of comorbid conditions.  The patient requires inpatient status due to high intensity of service, high risk of further deterioration and high frequency of surveillance required.  I certify that at the time of admission, it is my clinical judgment that the patient will require inpatient hospital care extending more than 2 midnights.                            Dispo: The patient is from: Home              Anticipated d/c is to: Home              Patient currently is not medically stable to d/c.              Difficult to place patient: No Authorized and performed by: Valente David, MD Total critical care time:   50     minutes. Due to a high probability of clinically significant, life-threatening deterioration, the patient required my highest level of preparedness to intervene emergently and I personally spent this critical care time directly and personally managing the patient.  This critical care time included obtaining a history, examining the patient, pulse oximetry, ordering and review of studies, arranging urgent treatment with development of management plan, evaluation of patient's response to treatment, frequent reassessment, and discussions with other providers. This critical care time was performed to assess and manage the high probability of imminent, life-threatening deterioration that could result in multiorgan failure.  It was  exclusive of separately billable procedures and treating other patients and teaching time.     Jazon Jipson A Farhan Jean  M.D on 12/06/2022 at 6:53 AM  [facility]   From 7 PM-7 AM, contact night-coverage [email or web address]  CC: Primary care physician; Dale Harvey, MD

## 2022-12-06 NOTE — Progress Notes (Signed)
This is a nonbillable note.  Please see his history and physical by Dr. Luane School is a 72 y.o. female with medical history significant for hypertension, dyslipidemia, hypothyroidism, GERD, and OSA, who presented to the emergency room with acute onset of worsening dyspnea with associated cough productive of yellowish sputum as well as wheezing over the last couple of days.  Patient seen and examined, will continue steroids, Rocephin, discontinue Zithromax as patient has myasthenia gravis.  Continue to follow closely.

## 2022-12-06 NOTE — Assessment & Plan Note (Signed)
-   We will continue her pyridostigmine.

## 2022-12-06 NOTE — Progress Notes (Signed)
PHARMACIST - PHYSICIAN COMMUNICATION  CONCERNING:  Enoxaparin (Lovenox) for DVT Prophylaxis    RECOMMENDATION: Patient was prescribed enoxaprin 40mg  q24 hours for VTE prophylaxis.   Filed Weights   12/05/22 2237  Weight: 103.4 kg (228 lb)    Body mass index is 35.71 kg/m.  Estimated Creatinine Clearance: 77.8 mL/min (by C-G formula based on SCr of 0.82 mg/dL).   Based on [facility] policy patient is candidate for enoxaparin 0.5mg /kg TBW SQ every 24 hours based on BMI being >30.  DESCRIPTION: Pharmacy has adjusted enoxaparin dose per [facility] policy.  Patient is now receiving enoxaparin 0.5 mg/kg every 24 hours   Otelia Sergeant, PharmD, [facility] 12/06/2022 2:27 AM

## 2022-12-06 NOTE — ED Provider Notes (Incomplete)
[facility] Provider Note    Event Date/Time   First MD Initiated Contact with Patient 12/05/22 2230     (approximate)   History   Respiratory Distress   HPI  Madison Burns is a 72 y.o. female past medical history significant for atrial fibrillation, myasthenia gravis, epilepsy, who presents to the emergency department shortness of breath.  Patient comes from nursing facility.  States that she has been having progressively worsening shortness of breath.  States that she has been coughing with shortness of breath since Thursday.  Diagnosed with pneumonia and started on doxycycline and prednisone.  Given a DuoNeb treatment and then continued to have increased work of breathing and shortness of breath so she was sent to the emergency department.  Patient with shortness of breath, wheezing and coughing.  Denies any history of DVT or PE.  Denies any falls or trauma.     Physical Exam   Triage Vital Signs: ED Triage Vitals  Encounter Vitals Group     BP      Systolic BP Percentile      Diastolic BP Percentile      Pulse      Resp      Temp      Temp src      SpO2      Weight      Height      Head Circumference      Peak Flow      Pain Score      Pain Loc      Pain Education      Exclude from Growth Chart     Most recent vital signs: There were no vitals filed for this visit.  Physical Exam Constitutional:      Appearance: She is well-developed.  HENT:     Head: Atraumatic.  Eyes:     Extraocular Movements: Extraocular movements intact.     Conjunctiva/sclera: Conjunctivae normal.     Pupils: Pupils are equal, round, and reactive to light.  Cardiovascular:     Rate and Rhythm: Regular rhythm.  Pulmonary:     Effort: No respiratory distress.     Breath sounds: Wheezing present.  Abdominal:     General: There is no distension.     Tenderness: There is no abdominal tenderness.  Musculoskeletal:        General: Normal range of motion.      Cervical back: Normal range of motion.     Right lower leg: No edema.     Left lower leg: No edema.  Skin:    General: Skin is warm.     Capillary Refill: Capillary refill takes less than 2 seconds.  Neurological:     Mental Status: She is alert. Mental status is at baseline.     IMPRESSION / MDM / ASSESSMENT AND PLAN / ED COURSE  I reviewed the triage vital signs and the nursing notes.  On arrival blood cultures obtained given known diagnosis of pneumonia. EKG  I, Corena Herter, the attending physician, personally viewed and interpreted this ECG.   Rate: Normal  Rhythm: Normal sinus  Axis: Normal  Intervals: Normal  ST&T Change: None  No tachycardic or bradycardic dysrhythmias while on cardiac telemetry.  RADIOLOGY I independently reviewed imaging, my interpretation of imaging: ***  LABS (all labs ordered are listed, but only abnormal results are displayed) Labs interpreted as -    Labs Reviewed - No data to display   MDM  Clinical Course as of 12/06/22 0002  Fri Dec 05, 2022  2352 DG Chest [place] [address] I viewed and interpreted the patient's chest x-ray.  There is some evidence of atelectasis versus infiltrate but it seems unchanged from before. [CF]  2352 Comprehensive metabolic panel notable for moderate hyponatremia, normal renal function.  Very minimal leukocytosis of 11.2.  High-sensitivity troponin is normal and lactic acid is within normal limits.  Coagulation studies also within normal limits. [CF]  2352 , [CF]    Clinical Course User Index [CF] Loleta Rose, MD     PROCEDURES:  Critical Care performed: No  Procedures  Patient's presentation is most consistent with {EM COPA:27473}   MEDICATIONS ORDERED IN ED: Medications - No data to display  FINAL CLINICAL IMPRESSION(S) / ED DIAGNOSES   Final diagnoses:  None     Rx / DC Orders   ED Discharge Orders     None        Note:  This document was prepared using Dragon voice  recognition software and may include unintentional dictation errors.

## 2022-12-07 ENCOUNTER — Encounter: Payer: Self-pay | Admitting: Internal Medicine

## 2022-12-07 DIAGNOSIS — J9601 Acute respiratory failure with hypoxia: Secondary | ICD-10-CM | POA: Diagnosis not present

## 2022-12-07 DIAGNOSIS — E871 Hypo-osmolality and hyponatremia: Secondary | ICD-10-CM

## 2022-12-07 DIAGNOSIS — J441 Chronic obstructive pulmonary disease with (acute) exacerbation: Secondary | ICD-10-CM | POA: Diagnosis not present

## 2022-12-07 LAB — BASIC METABOLIC PANEL
Anion gap: 8 (ref 5–15)
BUN: 19 mg/dL (ref 8–23)
CO2: 29 mmol/L (ref 22–32)
Calcium: 7.9 mg/dL — ABNORMAL LOW (ref 8.9–10.3)
Chloride: 95 mmol/L — ABNORMAL LOW (ref 98–111)
Creatinine, Ser: 0.81 mg/dL (ref 0.44–1.00)
GFR, Estimated: >60 mL/min (ref >60)
Glucose, Bld: 109 mg/dL — ABNORMAL HIGH (ref 70–99)
Potassium: 4.2 mmol/L (ref 3.5–5.1)
Sodium: 132 mmol/L — ABNORMAL LOW (ref 135–145)

## 2022-12-07 LAB — MAGNESIUM: Magnesium: 2 mg/dL (ref 1.7–2.4)

## 2022-12-07 LAB — PROCALCITONIN: Procalcitonin: <0.1 ng/mL

## 2022-12-07 LAB — MRSA NEXT GEN BY PCR, NASAL: MRSA by PCR Next Gen: NOT DETECTED

## 2022-12-07 MED ORDER — IPRATROPIUM-ALBUTEROL 0.5-2.5 (3) MG/3ML IN SOLN
3.0000 mL | Freq: Four times a day (QID) | RESPIRATORY_TRACT | Status: DC
  Administered 2022-12-07 – 2022-12-08 (×2): 3 mL via RESPIRATORY_TRACT
  Filled 2022-12-07 (×2): qty 3

## 2022-12-07 NOTE — Plan of Care (Signed)
  Problem: Clinical Measurements: Goal: Diagnostic test results will improve Outcome: Progressing   Problem: Respiratory: Goal: Ability to maintain adequate ventilation will improve Outcome: Progressing   Problem: Activity: Goal: Ability to tolerate increased activity will improve Outcome: Progressing   Problem: Respiratory: Goal: Levels of oxygenation will improve Outcome: Progressing Goal: Ability to maintain adequate ventilation will improve Outcome: Progressing   Problem: Activity: Goal: Risk for activity intolerance will decrease Outcome: Progressing   Problem: Nutrition: Goal: Adequate nutrition will be maintained Outcome: Progressing   Problem: Coping: Goal: Level of anxiety will decrease Outcome: Progressing

## 2022-12-07 NOTE — Progress Notes (Signed)
   12/07/22 1700  Spiritual Encounters  Type of Visit Initial  Care provided to: Pt and family  Conversation partners present during encounter Nurse  Referral source Patient request  Reason for visit Religious ritual  OnCall Visit Yes   Chaplain responded to request for prayer by patient.

## 2022-12-07 NOTE — Progress Notes (Signed)
Progress Note   Patient: Madison Burns VHQ:[id number] DOB: October 16, 1950 DOA: 12/05/2022     1 DOS: the patient was seen and examined on 12/07/2022   Brief hospital course: This is a nonbillable note.  Please see his history and physical by Dr. Luane School is a 72 y.o. female with medical history significant for hypertension, dyslipidemia, hypothyroidism, GERD, and OSA, who presented to the emergency room with acute onset of worsening dyspnea with associated cough productive of yellowish sputum as well as wheezing over the last couple of days.  Patient seen and examined, will continue steroids, Rocephin, discontinue Zithromax as patient has myasthenia gravis.    Principal Problem:   COPD exacerbation (HCC) Active Problems:   Acute respiratory failure with hypoxia (HCC)   Hyponatremia   Myasthenia gravis (HCC)   Hypothyroidism   Paroxysmal atrial fibrillation (HCC)   Assessment and Plan:  * COPD exacerbation (HCC) Acute on chronic hypoxemic respiratory failure. Patient was chronically on 2 L oxygen in the nursing home, she had a worsening shortness of breath at time of admission.  Initially she was placed on BiPAP due to respite distress, that has improved since admission.  She has COPD exacerbation.  Reviewed chest x-ray, bilateral lower lobe chronic changes, appear to be atelectasis, no change from prior.  I will obtain a procalcitonin level to rule out a pneumonia. Patient is treated with Rocephin, steroids.   Hyponatremia Condition improving on fluid restriction.  Paroxysmal atrial fibrillation (HCC) - We will continue amiodarone and Cardizem CD.  Hypothyroidism - We will continue Synthroid.  Myasthenia gravis (HCC) - We will continue her pyridostigmine  Morbid obesity. BMI 35.71 with comorbidities. Diet and exercise.     Subjective:  Still has significant weakness, short of breath and cough.  Physical Exam: Vitals:   12/07/22 1000 12/07/22 1014  12/07/22 1159 12/07/22 1200  BP: 123/77 123/77  115/71  Pulse: 79   84  Resp: 13   16  Temp:   97.9 F (36.6 C)   TempSrc:   Oral   SpO2: 100%   100%  Weight:      Height:       General exam: Appears calm and comfortable  Respiratory system: Decreased breathing sounds with a few wheezes. Respiratory effort normal. Cardiovascular system: S1 & S2 heard, RRR. No JVD, murmurs, rubs, gallops or clicks. No pedal edema. Gastrointestinal system: Abdomen is nondistended, soft and nontender. No organomegaly or masses felt. Normal bowel sounds heard. Central nervous system: Alert and oriented. No focal neurological deficits. Extremities: Symmetric 5 x 5 power. Skin: No rashes, lesions or ulcers Psychiatry: Judgement and insight appear normal. Mood & affect appropriate.    Data Reviewed:  Lab results reviewed.  Family Communication: Not able to reach sister  Disposition: Status is: Inpatient Remains inpatient appropriate because: Severity of disease, IV treatment.     Time spent: 36 minutes  Author: Marrion Coy, MD 12/07/2022 1:31 PM  For on call review [email or web address].

## 2022-12-07 NOTE — ED Notes (Signed)
Advised nurse that patient has ready bed 

## 2022-12-07 NOTE — ED Notes (Signed)
Pt is alert and oriented x4. Fall risk armband was applied. Pt was given po water for hydration. Pt was repositoned in bed after changing underpad, gown, sheets, and blanket d/t soiling with urine. Pt tolerated it well.

## 2022-12-08 DIAGNOSIS — E871 Hypo-osmolality and hyponatremia: Secondary | ICD-10-CM | POA: Diagnosis not present

## 2022-12-08 DIAGNOSIS — J441 Chronic obstructive pulmonary disease with (acute) exacerbation: Secondary | ICD-10-CM | POA: Diagnosis not present

## 2022-12-08 DIAGNOSIS — J9601 Acute respiratory failure with hypoxia: Secondary | ICD-10-CM | POA: Diagnosis not present

## 2022-12-08 MED ORDER — IPRATROPIUM-ALBUTEROL 0.5-2.5 (3) MG/3ML IN SOLN
3.0000 mL | Freq: Three times a day (TID) | RESPIRATORY_TRACT | Status: DC
  Filled 2022-12-08: qty 3

## 2022-12-08 MED ORDER — INFLUENZA VAC A&B SURF ANT ADJ 0.5 ML IM SUSY
0.5000 mL | PREFILLED_SYRINGE | Freq: Once | INTRAMUSCULAR | Status: DC
  Filled 2022-12-08: qty 0.5

## 2022-12-08 MED ORDER — PREDNISONE 20 MG PO TABS: 40.0000 mg | ORAL_TABLET | Freq: Every day | ORAL | Status: AC

## 2022-12-08 MED ORDER — FLUTICASONE FUROATE-VILANTEROL 100-25 MCG/ACT IN AEPB: 1.0000 | INHALATION_SPRAY | Freq: Every day | RESPIRATORY_TRACT | Status: AC

## 2022-12-08 NOTE — [place] FL2 (Signed)
[place] MEDICAID FL2 LEVEL OF CARE FORM     IDENTIFICATION  Patient Name: Madison Burns Birthdate: 1950-06-19 Sex: female Admission Date (Current Location): 12/05/2022  [facility] and [place] Number:  Chiropodist and Address:  [facility]: [place] [address], [address] [place], [place] [address]      Provider Number: [id number]  Attending Physician Name and Address:  Marrion Coy, MD  Relative Name and Phone Number:  Acquanetta Belling [phone]    Current Level of Care: Hospital Recommended Level of Care: Nursing Facility Prior Approval Number:    Date Approved/Denied:   PASRR Number:    Discharge Plan: Domiciliary (Rest home)    Current Diagnoses: Patient Active Problem List   Diagnosis Date Noted   COPD exacerbation (HCC) 12/06/2022   Acute respiratory failure with hypoxia (HCC) 12/06/2022   Aortic atherosclerosis (HCC) 10/29/2022   Closed compression fracture of L1 vertebra (HCC) 07/03/2022   Hematoma 05/24/2022   Pressure ulcer 05/24/2022   Closed fracture of alveolar process of maxilla, initial encounter (HCC) 05/24/2022   Metacarpal bone fracture 05/05/2022   Hyponatremia 04/10/2022   ABLA (acute blood loss anemia) 04/08/2022   Paroxysmal atrial fibrillation (HCC) 04/02/2022   GERD without esophagitis 04/02/2022   Nondisplaced fracture of shaft of fifth metacarpal bone, left hand, initial encounter for closed fracture 04/02/2022   Sore throat 11/11/2021   Frequent falls 05/14/2021   Right leg swelling 05/14/2021   Healthcare maintenance 03/22/2021   Fall 03/16/2021   Sleeps in sitting position due to orthopnea 10/31/2020   Thrombocytopenia (HCC) 08/22/2020   Myasthenia gravis with (acute) exacerbation (HCC) 11/15/2019   Anemia due to chemotherapy for renal cell cancer treated with erythropoietin (HCC) 11/15/2019   Malignant thymoma (HCC) 11/15/2019   Idiopathic profound intellectual disability 11/15/2019   Dyspnea on exertion  11/15/2019   Palliative care by specialist    Pressure injury of skin 10/24/2019   Atrial fibrillation (HCC) 10/23/2019   Community acquired pneumonia 10/23/2019   Hypothyroidism    Left renal mass 08/17/2019   Left shoulder pain 07/18/2019   Mediastinal mass 04/11/2019   Renal cell cancer, left (HCC) 03/09/2019   Thymoma 03/09/2019   Chest pain 12/05/2018   Fullness of breast 11/30/2018   History of seizure disorder 01/05/2018   Gastroesophagitis 07/02/2017   Epilepsy, generalized, convulsive (HCC) 02/23/2017   OSA (obstructive sleep apnea) 02/23/2017   Mood disorder (HCC) 10/01/2016   Pain in right toe(s) 06/17/2015   MG (myasthenia gravis) (HCC) 06/06/2015   OSA on CPAP 06/06/2015   Dysuria 02/12/2015   Goals of care, counseling/discussion 06/12/2014   Obstructive sleep apnea syndrome 05/03/2014   MG with exacerbation (myasthenia gravis) (HCC) 04/10/2014   Hypersomnia with sleep apnea 04/10/2014   Obesity (BMI 30-39.9) 01/29/2014   Hyperglycemia 01/29/2014   Abdominal pain 01/29/2014   Melanoma (HCC) 01/23/2013   Hypercholesterolemia 12/21/2011   Hypertension 12/21/2011   Myasthenia gravis (HCC) 12/21/2011   Seizure disorder (HCC) 12/21/2011   Irritable bowel 12/21/2011    Orientation RESPIRATION BLADDER Height & Weight     Self, Time, Situation, Place  O2 (2 liters) Incontinent Weight: 103.4 kg Height:  5\' 7"  (170.2 cm)  BEHAVIORAL SYMPTOMS/MOOD NEUROLOGICAL BOWEL NUTRITION STATUS      Incontinent Diet (see dc summary)  AMBULATORY STATUS COMMUNICATION OF NEEDS Skin   Limited Assist Verbally Normal                       Personal Care Assistance  Level of Assistance  Bathing, Feeding, Dressing Bathing Assistance: Limited assistance Feeding assistance: Independent Dressing Assistance: Limited assistance     Functional Limitations Info  Sight, Hearing, Speech Sight Info: Adequate Hearing Info: Adequate Speech Info: Adequate    SPECIAL CARE FACTORS  FREQUENCY                       Contractures Contractures Info: Not present    Additional Factors Info  Code Status, Allergies Code Status Info: DNR Allergies Info: NKDA           Current Medications (12/08/2022):  This is the current hospital active medication list Current Facility-Administered Medications  Medication Dose Route Frequency Provider Last Rate Last Admin   acetaminophen (TYLENOL) tablet 650 mg  650 mg Oral Q6H PRN Mansy, Jan A, MD       Or   acetaminophen (TYLENOL) suppository 650 mg  650 mg Rectal Q6H PRN Mansy, Jan A, MD       acidophilus (RISAQUAD) capsule 1 capsule  1 capsule Oral Daily Mansy, Jan A, MD   1 capsule at 12/07/22 1012   amiodarone (PACERONE) tablet 200 mg  200 mg Oral Daily Mansy, Jan A, MD   200 mg at 12/07/22 1013   ascorbic acid (VITAMIN C) tablet 1,000 mg  1,000 mg Oral QHS Mansy, Jan A, MD   1,000 mg at 12/07/22 2134   B-complex with vitamin C tablet 1 tablet  1 tablet Oral QPM Mansy, Jan A, MD   1 tablet at 12/07/22 1832   benzonatate (TESSALON) capsule 100 mg  100 mg Oral BID PRN Mansy, Jan A, MD   100 mg at 12/07/22 2258   calcium-vitamin D (OSCAL WITH D) 500-5 MG-MCG per tablet 1 tablet  1 tablet Oral QPM Mansy, Jan A, MD   1 tablet at 12/07/22 1831   cefTRIAXone (ROCEPHIN) 2 g in sodium chloride 0.9 % 100 mL IVPB  2 g Intravenous Q24H Corena Herter, MD 200 mL/hr at 12/07/22 2110 2 g at 12/07/22 2110   cyanocobalamin (VITAMIN B12) tablet 250 mcg  250 mcg Oral Daily Mansy, Jan A, MD   250 mcg at 12/07/22 1013   desmopressin (DDAVP NASAL) 0.01 % solution 10 mcg  10 mcg Nasal BID Mansy, Jan A, MD   10 mcg at 12/07/22 1014   diltiazem (CARDIZEM CD) 24 hr capsule 180 mg  180 mg Oral Daily Mansy, Jan A, MD   180 mg at 12/07/22 1014   DULoxetine (CYMBALTA) DR capsule 30 mg  30 mg Oral Daily Mansy, Jan A, MD   30 mg at 12/07/22 1013   enoxaparin (LOVENOX) injection 50 mg  50 mg Subcutaneous Q24H Mansy, Jan A, MD   50 mg at 12/07/22 1012    fesoterodine (TOVIAZ) tablet 4 mg  4 mg Oral Daily Mansy, Jan A, MD   4 mg at 12/07/22 1013   folic acid (FOLVITE) tablet 2 mg  2 mg Oral Daily Mansy, Jan A, MD   2 mg at 12/07/22 1012   ipratropium-albuterol (DUONEB) 0.5-2.5 (3) MG/3ML nebulizer solution 3 mL  3 mL Nebulization TID Marrion Coy, MD       levothyroxine (SYNTHROID) tablet 75 mcg  75 mcg Oral Q0600 Mansy, Jan A, MD   75 mcg at 12/08/22 0558   magnesium hydroxide (MILK OF MAGNESIA) suspension 30 mL  30 mL Oral Daily PRN Mansy, Jan A, MD       methotrexate (RHEUMATREX) tablet 10 mg  10 mg Oral Weekly Mansy, Jan A, MD       ondansetron [facility]) tablet 4 mg  4 mg Oral Q6H PRN Mansy, Jan A, MD       Or   ondansetron [facility]) injection 4 mg  4 mg Intravenous Q6H PRN Mansy, Jan A, MD       pantoprazole (PROTONIX) EC tablet 40 mg  40 mg Oral Daily Mansy, Jan A, MD   40 mg at 12/07/22 1013   predniSONE (DELTASONE) tablet 40 mg  40 mg Oral Q breakfast Mansy, Jan A, MD   40 mg at 12/08/22 9629   pyridostigmine (MESTINON) tablet 60 mg  60 mg Oral TID Mansy, Jan A, MD   60 mg at 12/07/22 2112   risperiDONE (RISPERDAL) tablet 1 mg  1 mg Oral QHS Mansy, Jan A, MD   1 mg at 12/07/22 2112   traZODone (DESYREL) tablet 25 mg  25 mg Oral QHS PRN Mansy, Jan A, MD   25 mg at 12/07/22 2113   vitamin E capsule 400 Units  400 Units Oral QPM Mansy, Jan A, MD   400 Units at 12/07/22 1832     Discharge Medications: Please see discharge summary for a list of discharge medications.  Relevant Imaging Results:  Relevant Lab Results:   Additional Information SS# [id number]  Marlowe Sax, RN

## 2022-12-08 NOTE — TOC Initial Note (Signed)
Transition of Care [facility]) - Initial/Assessment Note    Patient Details  Name: Madison Burns MRN: [id number] Date of Birth: 07-01-1950  Transition of Care [facility]) CM/SW Contact:    Marlowe Sax, RN Phone Number: 12/08/2022, 9:51 AM  Clinical Narrative:                 Confirmed with [facility] that the [facility] is a long term care patient at [facility]        Patient Goals and CMS Choice            Expected Discharge Plan and Services                                              Prior Living Arrangements/Services                       Activities of Daily Living   ADL Screening (condition at time of admission) Independently performs ADLs?: No (must be setup) Does the patient have a NEW difficulty with bathing/dressing/toileting/self-feeding that is expected to last >3 days?: Yes (Initiates electronic notice to provider for possible OT consult) (needs assisstance) Does the patient have a NEW difficulty with getting in/out of bed, walking, or climbing stairs that is expected to last >3 days?: No Does the patient have a NEW difficulty with communication that is expected to last >3 days?: No Is the patient deaf or have difficulty hearing?: No Does the patient have difficulty seeing, even when wearing glasses/contacts?: No Does the patient have difficulty concentrating, remembering, or making decisions?: No  Permission Sought/Granted                  Emotional Assessment              Admission diagnosis:  Hyponatremia [E87.1] COPD exacerbation (HCC) [J44.1] Acute respiratory failure with hypoxia (HCC) [J96.01] Myasthenia gravis (HCC) [G70.00] Moderate asthma with exacerbation, unspecified whether persistent [J45.901] Community acquired pneumonia, unspecified laterality [J18.9] Patient Active Problem List   Diagnosis Date Noted   COPD exacerbation (HCC) 12/06/2022   Acute respiratory failure with hypoxia (HCC) 12/06/2022   Aortic  atherosclerosis (HCC) 10/29/2022   Closed compression fracture of L1 vertebra (HCC) 07/03/2022   Hematoma 05/24/2022   Pressure ulcer 05/24/2022   Closed fracture of alveolar process of maxilla, initial encounter (HCC) 05/24/2022   Metacarpal bone fracture 05/05/2022   Hyponatremia 04/10/2022   ABLA (acute blood loss anemia) 04/08/2022   Paroxysmal atrial fibrillation (HCC) 04/02/2022   GERD without esophagitis 04/02/2022   Nondisplaced fracture of shaft of fifth metacarpal bone, left hand, initial encounter for closed fracture 04/02/2022   Sore throat 11/11/2021   Frequent falls 05/14/2021   Right leg swelling 05/14/2021   Healthcare maintenance 03/22/2021   Fall 03/16/2021   Sleeps in sitting position due to orthopnea 10/31/2020   Thrombocytopenia (HCC) 08/22/2020   Myasthenia gravis with (acute) exacerbation (HCC) 11/15/2019   Anemia due to chemotherapy for renal cell cancer treated with erythropoietin (HCC) 11/15/2019   Malignant thymoma (HCC) 11/15/2019   Idiopathic profound intellectual disability 11/15/2019   Dyspnea on exertion 11/15/2019   Palliative care by specialist    Pressure injury of skin 10/24/2019   Atrial fibrillation (HCC) 10/23/2019   Community acquired pneumonia 10/23/2019   Hypothyroidism    Left renal mass 08/17/2019   Left shoulder pain  07/18/2019   Mediastinal mass 04/11/2019   Renal cell cancer, left (HCC) 03/09/2019   Thymoma 03/09/2019   Chest pain 12/05/2018   Fullness of breast 11/30/2018   History of seizure disorder 01/05/2018   Gastroesophagitis 07/02/2017   Epilepsy, generalized, convulsive (HCC) 02/23/2017   OSA (obstructive sleep apnea) 02/23/2017   Mood disorder (HCC) 10/01/2016   Pain in right toe(s) 06/17/2015   MG (myasthenia gravis) (HCC) 06/06/2015   OSA on CPAP 06/06/2015   Dysuria 02/12/2015   Goals of care, counseling/discussion 06/12/2014   Obstructive sleep apnea syndrome 05/03/2014   MG with exacerbation (myasthenia  gravis) (HCC) 04/10/2014   Hypersomnia with sleep apnea 04/10/2014   Obesity (BMI 30-39.9) 01/29/2014   Hyperglycemia 01/29/2014   Abdominal pain 01/29/2014   Melanoma (HCC) 01/23/2013   Hypercholesterolemia 12/21/2011   Hypertension 12/21/2011   Myasthenia gravis (HCC) 12/21/2011   Seizure disorder (HCC) 12/21/2011   Irritable bowel 12/21/2011   PCP:  Dale Imperial, MD Pharmacy:   [facility] [phone] Nicholes Rough, [place] - [address] [place] [address] Phone: [phone] Fax: [phone]  [facility] [place]. [place], [place] - [address] [place] [address] Phone: [phone] Fax: [phone]     Social Determinants of Health (SDOH) Social History: SDOH Screenings   Food Insecurity: No Food Insecurity (12/07/2022)  Housing: Low Risk  (12/07/2022)  Transportation Needs: No Transportation Needs (12/07/2022)  Utilities: Not At Risk (12/07/2022)  Depression (PHQ2-9): Low Risk  (05/30/2022)  Financial Resource Strain: Low Risk  (05/30/2022)  Physical Activity: Insufficiently Active (05/30/2022)  Social Connections: Unknown (05/30/2022)  Stress: No Stress Concern Present (05/30/2022)  Tobacco Use: Low Risk  (10/30/2022)   SDOH Interventions:     Readmission Risk Interventions    04/09/2020    1:25 PM  Readmission Risk Prevention Plan  Transportation Screening Complete  PCP or Specialist Appt within 3-5 Days Complete  HRI or Home Care Consult Complete  Social Work Consult for Recovery Care Planning/Counseling Complete  Palliative Care Screening Not Applicable  Medication Review Oceanographer) Complete

## 2022-12-08 NOTE — Evaluation (Signed)
Physical Therapy Evaluation Patient Details Name: Madison Burns MRN: [id number] DOB: 06-26-1950 Today's Date: 12/08/2022  History of Present Illness  Pt is a 72 y.o. female who presented to the emergency room with acute onset of worsening dyspnea with associated cough productive of yellowish sputum as well as wheezing over the last couple of days. PMH of of myasthenia gravis, cognitive impairment, hyperlipidemia, GERD, HTN, COPD, hypothyroidism, irritable bowel syndrome, OSA on CPAP, history of seizure disorder, history of kidney cancer, abdominal hysterectomy in 1990, cholecystectomy in 1999.   Clinical Impression  Pt alert, seated in recliner with OT at bedside. She was oriented to self, place and reported that [facility] lives [facility]. Stated she uses her RW at baseline. She was able to perform sit <> stand several times during session with RW and supervision. She ambulated ~105ft, 66ft, and then 69ft with a quick gait velocity. No LOB noted. Pt with some observable SOB, spO2 on 2L >90% throughout.  Overall the patient demonstrated deficits (see "PT Problem List") that impede the patient's functional abilities, safety, and mobility and would benefit from skilled PT intervention.          If plan is discharge home, recommend the following: A little help with bathing/dressing/bathroom;Supervision due to cognitive status;Assist for transportation;Direct supervision/assist for medications management;Help with stairs or ramp for entrance   Can travel by private vehicle        Equipment Recommendations None recommended by PT  Recommendations for Other Services       Functional Status Assessment Patient has had a recent decline in their functional status and demonstrates the ability to make significant improvements in function in a reasonable and predictable amount of time.     Precautions / Restrictions Precautions Precautions: Fall Restrictions Weight Bearing Restrictions: No       Mobility  Bed Mobility               General bed mobility comments: pt up in recliner at start/end of PT session    Transfers Overall transfer level: Needs assistance Equipment used: Rolling walker (2 wheels) Transfers: Sit to/from Stand Sit to Stand: Supervision                Ambulation/Gait Ambulation/Gait assistance: Supervision Gait Distance (Feet):  (95ft, 57ft, 69ft) Assistive device: Rolling walker (2 wheels)         General Gait Details: pt ambulates quickly. no LOB  Stairs            Wheelchair Mobility     Tilt Bed    Modified Rankin (Stroke Patients Only)       Balance Overall balance assessment: Needs assistance Sitting-balance support: Feet supported Sitting balance-Leahy Scale: Good     Standing balance support: Single extremity supported Standing balance-Leahy Scale: Fair Standing balance comment: standing at sink to wash hands/fingernails                             Pertinent Vitals/Pain Pain Assessment Pain Assessment: Faces Faces Pain Scale: Hurts a little bit Pain Location: bilateral legs with walking Pain Descriptors / Indicators: Sore Pain Intervention(s): Limited activity within patient's tolerance, Monitored during session, Repositioned    Home Living Family/patient expects to be discharged to:: Skilled nursing facility                   Additional Comments: Pt states she lives at [facility]    Prior Function Prior Level of  Function : Needs assist;Patient poor historian/Family not available             Mobility Comments: pt reports using a RW, walks to bathroom ADLs Comments: Pt states she is IND in ADLs but later states she needs help for LB     Extremity/Trunk Assessment   Upper Extremity Assessment Upper Extremity Assessment: Generalized weakness    Lower Extremity Assessment Lower Extremity Assessment: Generalized weakness       Communication    Communication Communication: Difficulty communicating thoughts/reduced clarity of speech (some jumbled speech noted)  Cognition Arousal: Alert Behavior During Therapy: WFL for tasks assessed/performed Overall Cognitive Status: No family/caregiver present to determine baseline cognitive functioning                                 General Comments: Alert to name, DOB, year/month (states it is 29th Oct), location but not siutation        General Comments      Exercises     Assessment/Plan    PT Assessment Patient needs continued PT services  PT Problem List Decreased activity tolerance;Decreased balance;Cardiopulmonary status limiting activity       PT Treatment Interventions DME instruction;Neuromuscular re-education;Gait training;Stair training;Patient/family education;Therapeutic activities;Therapeutic exercise;Balance training    PT Goals (Current goals can be found in the Care Plan section)  Acute Rehab PT Goals Patient Stated Goal: to go back to [facility] PT Goal Formulation: With patient Time For Goal Achievement: 12/22/22 Potential to Achieve Goals: Good    Frequency Min 1X/week     Co-evaluation               AM-PAC PT "6 Clicks" Mobility  Outcome Measure Help needed turning from your back to your side while in a flat bed without using bedrails?: None Help needed moving from lying on your back to sitting on the side of a flat bed without using bedrails?: None Help needed moving to and from a bed to a chair (including a wheelchair)?: None Help needed standing up from a chair using your arms (e.g., wheelchair or bedside chair)?: None Help needed to walk in hospital room?: None Help needed climbing 3-5 steps with a railing? : A Little 6 Click Score: 23    End of Session Equipment Utilized During Treatment: Oxygen Activity Tolerance: Patient tolerated treatment well Patient left: in chair;with call bell/phone within reach;with chair alarm set;with  nursing/sitter in room Nurse Communication: Mobility status PT Visit Diagnosis: Other abnormalities of gait and mobility (R26.89);Muscle weakness (generalized) (M62.81)    Time: 4166-0630 PT Time Calculation (min) (ACUTE ONLY): 17 min   Charges:   PT Evaluation $PT Eval Low Complexity: 1 Low PT Treatments $Therapeutic Activity: 8-22 mins PT General Charges $$ ACUTE PT VISIT: 1 Visit         Olga Coaster PT, DPT 11:35 AM,12/08/22

## 2022-12-08 NOTE — Consult Note (Signed)
[facility] Inpatient Consult   12/08/2022  Madison Burns 11-May-1950 [id number]  Primary Care Provider:  [facility])  Patient is currently active with Care Management for chronic disease management services.  Patient has been engaged by a Charity fundraiser.  Our community based plan of care has focused on disease management and community resource support.   Plan: Pt will return to [facility] (LTC) and the facility will continue to support pt's needs.   Liaison verified with [facility] team that pt resides at [facility] as a LTC pt. Liaison will notify current RNCM for community to close this case at this time.   Of note, Care Management services does not replace or interfere with any services that are needed or arranged by inpatient [facility] care management team.   For additional questions or referrals please contact:  Elliot Cousin, RN, [facility] Liaison [place]   [facility], Population Health Office Hours MTWF  8:00 am-6:00 pm Direct Dial: [phone] mobile [phone] [Office toll free line] Office Hours are M-F 8:30 - 5 pm [email or web address]

## 2022-12-08 NOTE — TOC Transition Note (Addendum)
Transition of Care [facility]) - CM/SW Discharge Note   Patient Details  Name: Madison Burns MRN: [id number] Date of Birth: Sep 09, 1950  Transition [facility] Care [facility]) CM/SW Contact:  Marlowe Sax, RN Phone Number: 12/08/2022, 11:02 AM   Clinical Narrative:     Attempted to contact the sister to notify of the DC, the sister is also residing at [facility], Patient will return to [facility] room 402A   Called EMS to transport      Patient Goals and CMS Choice      Discharge Placement                         Discharge Plan and Services Additional resources added to the After Visit Summary for                                       Social Determinants of Health (SDOH) Interventions SDOH Screenings   Food Insecurity: No Food Insecurity (12/07/2022)  Housing: Low Risk  (12/07/2022)  Transportation Needs: No Transportation Needs (12/07/2022)  Utilities: Not At Risk (12/07/2022)  Depression (PHQ2-9): Low Risk  (05/30/2022)  Financial Resource Strain: Low Risk  (05/30/2022)  Physical Activity: Insufficiently Active (05/30/2022)  Social Connections: Unknown (05/30/2022)  Stress: No Stress Concern Present (05/30/2022)  Tobacco Use: Low Risk  (10/30/2022)     Readmission Risk Interventions    04/09/2020    1:25 PM  Readmission Risk Prevention Plan  Transportation Screening Complete  PCP or Specialist Appt within 3-5 Days Complete  HRI or Home Care Consult Complete  Social Work Consult for Recovery Care Planning/Counseling Complete  Palliative Care Screening Not Applicable  Medication Review Oceanographer) Complete

## 2022-12-08 NOTE — Plan of Care (Signed)
  Problem: Fluid Volume: Goal: Hemodynamic stability will improve Outcome: Not Progressing   Problem: Respiratory: Goal: Ability to maintain adequate ventilation will improve Outcome: Not Progressing

## 2022-12-08 NOTE — Discharge Summary (Signed)
Physician Discharge Summary   Patient: Madison Burns MRN: [id number] DOB: 1951/02/17  Admit date:     12/05/2022  Discharge date: 12/08/22  Discharge Physician: Marrion Coy   PCP: Dale Hastings, MD   Recommendations at discharge:   Follow-up with PCP in 1 week.  Discharge Diagnoses: Principal Problem:   COPD exacerbation (HCC) Active Problems:   Acute respiratory failure with hypoxia (HCC)   Hyponatremia   Myasthenia gravis (HCC)   Hypothyroidism   Paroxysmal atrial fibrillation (HCC)  Resolved Problems:   * No resolved hospital problems. [facility] Course: This is a nonbillable note.  Please see his history and physical by Dr. Luane School is a 72 y.o. female with medical history significant for hypertension, dyslipidemia, hypothyroidism, GERD, and OSA, who presented to the emergency room with acute onset of worsening dyspnea with associated cough productive of yellowish sputum as well as wheezing over the last couple of days.  Patient seen and examined, will continue steroids, Rocephin, discontinue Zithromax as patient has myasthenia gravis.   Patient condition has improved today, feels back to baseline after giving bronchodilator.  Medically stable for discharge to long-term care facility.  Patient also has sleep apnea, she has CPAP machine at home, has not been using it in [facility].  She was on it for last 2 nights, discussed with TOC, will let family to bring CPAP machine to the facility.  Assessment and Plan: * COPD exacerbation (HCC) Acute on chronic hypoxemic respiratory failure. Obstructive sleep apnea. Patient was chronically on 2 L oxygen in the nursing home, she had a worsening shortness of breath at time of admission.  Initially she was placed on BiPAP due to respite distress, that has improved since admission.  She has COPD exacerbation.  Reviewed chest x-ray, bilateral lower lobe chronic changes, appear to be atelectasis, no change from prior.   Procalcitonin level less than 0.1, BMP 46.6. Patient also placed on IV steroids, changed to oral prednisone.  Condition has improved, she is back on 2 L oxygen. Patient has been diagnosed with sleep apnea, has a CPAP machine at home, she has not been wearing it in the long-term care facility.  I have asked social worker to arrange to bring it to [facility], so that the patient can continue on it.     Hyponatremia Condition improving on fluid restriction.   Paroxysmal atrial fibrillation (HCC) - We will continue amiodarone and Cardizem CD.   Hypothyroidism - We will continue Synthroid.   Myasthenia gravis (HCC) - We will continue her pyridostigmine   Morbid obesity. BMI 35.71 with comorbidities. Diet and exercise.       Consultants: None Procedures performed: None  Disposition: LTC Diet recommendation:  Discharge Diet Orders (From admission, onward)     Start     Ordered   12/08/22 0000  Diet - low sodium heart healthy       Comments: Fluid restriction 1750ml/day   12/08/22 1052           Cardiac diet fluid restriction DISCHARGE MEDICATION: Allergies as of 12/08/2022   No Known Allergies      Medication List     STOP taking these medications    vitamin E 180 MG (400 UNITS) capsule       TAKE these medications    acetaminophen 325 MG tablet Commonly known as: TYLENOL Take 2 tablets (650 mg total) by mouth every 6 (six) hours as needed for mild pain (or Fever >/= 101).  amiodarone 200 MG tablet Commonly known as: PACERONE Take 1 tablet (200 mg total) by mouth daily.   ascorbic acid 500 MG tablet Commonly known as: VITAMIN C Take 1,000 mg by mouth at bedtime.   B-complex with vitamin C tablet Take 1 tablet by mouth at bedtime.   barrier cream Crea Commonly known as: non-specified Apply 1 Application topically 3 (three) times daily. (Apply to buttocks each shift)   benzonatate 100 MG capsule Commonly known as: TESSALON TAKE 1 CAPSULE BY  MOUTH TWICE DAILY AS NEEDED FOR COUGH   Biotin 82956 MCG Tabs Take 10,000 mcg by mouth daily at 2 PM.   bisacodyl 10 MG suppository Commonly known as: DULCOLAX Place 10 mg rectally as needed for moderate constipation.   cetirizine 5 MG tablet Commonly known as: ZYRTEC Take 5 mg by mouth daily.   CITRACAL +D3 PO Take 1 tablet by mouth in the morning.   desmopressin 0.01 % solution Commonly known as: DDAVP NASAL Place 1 spray (10 mcg total) into the nose 2 (two) times daily. What changed:  when to take this additional instructions   diltiazem 180 MG 24 hr capsule Commonly known as: CARDIZEM CD Take 180 mg by mouth daily.   doxycycline 100 MG capsule Commonly known as: VIBRAMYCIN Take 100 mg by mouth 2 (two) times daily.   DULoxetine 30 MG capsule Commonly known as: CYMBALTA Take 1 capsule (30 mg total) by mouth daily.   Fleet Saline Enema 7-19 GM/197ML Enem Place 1 enema rectally once as needed (severe constipation).   fluticasone furoate-vilanterol 100-25 MCG/ACT Aepb Commonly known as: Breo Ellipta Inhale 1 puff into the lungs daily.   folic acid 1 MG tablet Commonly known as: FOLVITE TAKE 2 TABLETS BY MOUTH ONCE DAILY   furosemide 20 MG tablet Commonly known as: LASIX Take 20 mg by mouth daily.   Garlic 1000 MG Caps Take 1,000 mg by mouth in the morning, at noon, and at bedtime.   ipratropium-albuterol 0.5-2.5 (3) MG/3ML Soln Commonly known as: DUONEB Take 3 mLs by nebulization every 4 (four) hours as needed (wheezing).   levothyroxine 75 MCG tablet Commonly known as: SYNTHROID TAKE 1 TABLET BY MOUTH ONCE DAILY ON AN EMPTY STOMACH. WAIT 30 MINUTES BEFORE TAKING OTHER MEDS.   magnesium hydroxide 400 MG/5ML suspension Commonly known as: MILK OF MAGNESIA Take 30 mLs by mouth daily as needed for mild constipation.   magnesium oxide 400 (240 Mg) MG tablet Commonly known as: MAG-OX Take 400 mg by mouth daily.   methotrexate 2.5 MG tablet Commonly known  as: RHEUMATREX TAKE 4 TABLETS BY MOUTH ONCE A WEEK WITHFOLIC ACID What changed: See the new instructions.   pantoprazole 40 MG tablet Commonly known as: PROTONIX Take 40 mg by mouth daily.   predniSONE 20 MG tablet Commonly known as: DELTASONE Take 2 tablets (40 mg total) by mouth daily for 2 days.   pyridostigmine 60 MG tablet Commonly known as: MESTINON TAKE 1 TABLET BY MOUTH 3 TIMES DAILY   risperiDONE 1 MG tablet Commonly known as: RISPERDAL Take 1 mg by mouth at bedtime.   Robitussin Severe Cgh/Sr Thrt 650-20 MG/20ML Liqd Generic drug: Acetaminophen-DM Take 20 mLs by mouth every 6 (six) hours as needed (cough).   tolterodine 4 MG 24 hr capsule Commonly known as: DETROL LA TAKE 1 CAPSULE BY MOUTH ONCE EVERY MORNING   vitamin B-12 250 MCG tablet Commonly known as: CYANOCOBALAMIN Take 250 mcg by mouth 2 (two) times daily.  Follow-up Information     Dale [place], MD Follow up in 1 week(s).   Specialty: Internal Medicine Contact information: [address] [place] [address] [phone]                Discharge Exam: Ceasar Mons Weights   12/05/22 2237  Weight: 103.4 kg   General exam: Appears calm and comfortable  Respiratory system: Decreased breath sounds. Respiratory effort normal. Cardiovascular system: S1 & S2 heard, RRR. No JVD, murmurs, rubs, gallops or clicks. No pedal edema. Gastrointestinal system: Abdomen is nondistended, soft and nontender. No organomegaly or masses felt. Normal bowel sounds heard. Central nervous system: Alert and oriented. No focal neurological deficits. Extremities: Symmetric 5 x 5 power. Skin: No rashes, lesions or ulcers Psychiatry: Judgement and insight appear normal. Mood & affect appropriate.    Condition at discharge: fair  The results of significant diagnostics from this hospitalization (including imaging, microbiology, ancillary and laboratory) are listed below for reference.    Imaging Studies: DG Chest Port 1 View  Result Date: 12/05/2022 CLINICAL DATA:  Question of sepsis to evaluate for abnormality. Respiratory distress. Diagnosed today with pneumonia. Oxygen saturations are decreasing despite treatment. EXAM: PORTABLE CHEST 1 VIEW COMPARISON:  09/15/2022 FINDINGS: Shallow inspiration with infiltration or atelectasis in the lung bases, greater on the left. Appearance is similar to prior study. No pleural effusions. No pneumothorax. Mediastinal contours appear intact. Scarring in the right upper lung. Normal heart size. IMPRESSION: Infiltration or atelectasis in the lung bases, greater on the left. Similar appearance to prior study. Electronically Signed   By: Burman Nieves M.D.   On: 12/05/2022 23:20    Microbiology: Results for orders placed or performed during the hospital encounter of 12/05/22  Blood Culture (routine x 2)     Status: None (Preliminary result)   Collection Time: 12/05/22 11:12 PM   Specimen: BLOOD  Result Value Ref Range Status   Specimen Description BLOOD BLOOD LEFT ARM  Final   Special Requests   Final    BOTTLES DRAWN AEROBIC AND ANAEROBIC Blood Culture results may not be optimal due to an excessive volume of blood received in culture bottles   Culture   Final    NO GROWTH 3 DAYS Performed at [facility], [address], [place], [place] [address]    Report Status PENDING  Incomplete  Blood Culture (routine x 2)     Status: None (Preliminary result)   Collection Time: 12/05/22 11:23 PM   Specimen: BLOOD  Result Value Ref Range Status   Specimen Description BLOOD BLOOD RIGHT ARM  Final   Special Requests   Final    BOTTLES DRAWN AEROBIC AND ANAEROBIC Blood Culture adequate volume   Culture   Final    NO GROWTH 3 DAYS Performed at [facility], [address], [place], [place] [address]    Report Status PENDING  Incomplete  Resp panel by RT-PCR (RSV, Flu A&B, Covid) Anterior Nasal Swab     Status: None    Collection Time: 12/06/22  1:14 AM   Specimen: Anterior Nasal Swab  Result Value Ref Range Status   SARS Coronavirus 2 by RT PCR NEGATIVE NEGATIVE Final    Comment: (NOTE) SARS-CoV-2 target nucleic acids are NOT DETECTED.  The SARS-CoV-2 RNA is generally detectable in upper respiratory specimens during the acute phase of infection. The lowest concentration of SARS-CoV-2 viral copies this assay can detect is 138 copies/mL. A negative result does not preclude SARS-Cov-2 infection and should  not be used as the sole basis for treatment or other patient management decisions. A negative result may occur with  improper specimen collection/handling, submission of specimen other than nasopharyngeal swab, presence of viral mutation(s) within the areas targeted by this assay, and inadequate number of viral copies(<138 copies/mL). A negative result must be combined with clinical observations, patient history, and epidemiological information. The expected result is Negative.  Fact Sheet for Patients:  BloggerCourse.com  Fact Sheet for Healthcare Providers:  SeriousBroker.it  This test is no t yet approved or cleared by the [place] FDA and  has been authorized for detection and/or diagnosis of SARS-CoV-2 by FDA under an Emergency Use Authorization (EUA). This EUA will remain  in effect (meaning this test can be used) for the duration of the COVID-19 declaration under Section 564(b)(1) of the Act, 21 U.S.C.section 360bbb-3(b)(1), unless the authorization is terminated  or revoked sooner.       Influenza A by PCR NEGATIVE NEGATIVE Final   Influenza B by PCR NEGATIVE NEGATIVE Final    Comment: (NOTE) The Xpert Xpress SARS-CoV-2/FLU/RSV plus assay is intended as an aid in the diagnosis of influenza from Nasopharyngeal swab specimens and should not be used as a sole basis for treatment. Nasal washings and aspirates are unacceptable for  Xpert Xpress SARS-CoV-2/FLU/RSV testing.  Fact Sheet for Patients: BloggerCourse.com  Fact Sheet for Healthcare Providers: SeriousBroker.it  This test is not yet approved or cleared by the [place] FDA and has been authorized for detection and/or diagnosis of SARS-CoV-2 by FDA under an Emergency Use Authorization (EUA). This EUA will remain in effect (meaning this test can be used) for the duration of the COVID-19 declaration under Section 564(b)(1) of the Act, 21 U.S.C. section 360bbb-3(b)(1), unless the authorization is terminated or revoked.     Resp Syncytial Virus by PCR NEGATIVE NEGATIVE Final    Comment: (NOTE) Fact Sheet for Patients: BloggerCourse.com  Fact Sheet for Healthcare Providers: SeriousBroker.it  This test is not yet approved or cleared by the [place] FDA and has been authorized for detection and/or diagnosis of SARS-CoV-2 by FDA under an Emergency Use Authorization (EUA). This EUA will remain in effect (meaning this test can be used) for the duration of the COVID-19 declaration under Section 564(b)(1) of the Act, 21 U.S.C. section 360bbb-3(b)(1), unless the authorization is terminated or revoked.  Performed at [facility], [address] [place], [place] [address]   MRSA Next Gen by PCR, Nasal     Status: None   Collection Time: 12/07/22  9:27 PM   Specimen: Nasal Mucosa; Nasal Swab  Result Value Ref Range Status   MRSA by PCR Next Gen NOT DETECTED NOT DETECTED Final    Comment: (NOTE) The GeneXpert MRSA Assay (FDA approved for NASAL specimens only), is one component of a comprehensive MRSA colonization surveillance program. It is not intended to diagnose MRSA infection nor to guide or monitor treatment for MRSA infections. Test performance is not FDA approved in patients less than 68 years old. Performed at [facility],  [address] [place], [place] [address]     Labs: CBC: Recent Labs  Lab 12/05/22 2315 12/06/22 0516  WBC 11.2* 10.0  NEUTROABS 10.5*  --   HGB 11.9* 11.4*  HCT 37.2 34.5*  MCV 92.8 90.8  PLT 190 189   Basic Metabolic Panel: Recent Labs  Lab 12/05/22 2315 12/06/22 0516 12/07/22 0624  NA 126* 126* 132*  K 4.2 4.4 4.2  CL 89* 90*  95*  CO2 26 25 29   GLUCOSE 161* 171* 109*  BUN 17 16 19   CREATININE 0.82 0.81 0.81  CALCIUM 8.0* 8.0* 7.9*  MG  --   --  2.0   Liver Function Tests: Recent Labs  Lab 12/05/22 2315  AST 30  ALT 25  ALKPHOS 89  BILITOT 0.5  PROT 6.5  ALBUMIN 3.4*   CBG: No results for input(s): "GLUCAP" in the last 168 hours.  Discharge time spent: greater than 30 minutes.  Signed: Marrion Coy, MD [facility] 12/08/2022

## 2022-12-08 NOTE — Care Management Important Message (Signed)
Important Message  Patient Details  Name: Madison Burns MRN: [id number] Date of Birth: 02/01/1951   Important Message Given:  Yes - Medicare IM     Marlowe Sax, RN 12/08/2022, 10:11 AM

## 2022-12-08 NOTE — Evaluation (Signed)
Occupational Therapy Evaluation Patient Details Name: Madison Burns MRN: [id number] DOB: 1950/10/12 Today's Date: 12/08/2022   History of Present Illness Pt is a 72 y.o. female who presented to the emergency room with acute onset of worsening dyspnea with associated cough productive of yellowish sputum as well as wheezing over the last couple of days. PMH of of myasthenia gravis, cognitive impairment, hyperlipidemia, GERD, HTN, COPD, hypothyroidism, irritable bowel syndrome, OSA on CPAP, history of seizure disorder, history of kidney cancer, abdominal hysterectomy in 1990, cholecystectomy in 1999.   Clinical Impression   Pt was seen for OT evaluation this date. Prior to hospital admission, pt reports performing ADLs MOD I (MIN A for shoes/socks), uses a RW for mobility and staff assists with IADLs. Pt lives at [facility]. Pt presents to acute OT demonstrating impaired ADL performance and functional mobility 2/2 generalized weakness and decreased tolerance to activity (See OT problem list for additional functional deficits).  Appears to be near functional baseline for ADL performance. Currently at [facility] level for mobility/ADLs.   Pt would benefit from skilled OT services to address noted impairments and functional limitations (see below for any additional details) in order to maximize safety and independence while minimizing falls risk and caregiver burden. Anticipate the need for follow up OT services upon acute hospital DC.        If plan is discharge home, recommend the following: A little help with walking and/or transfers;A little help with bathing/dressing/bathroom;Assistance with cooking/housework;Direct supervision/assist for medications management;Direct supervision/assist for financial management;Assist for transportation;Help with stairs or ramp for entrance;Supervision due to cognitive status    Functional Status Assessment  Patient has had a recent decline in their  functional status and demonstrates the ability to make significant improvements in function in a reasonable and predictable amount of time.  Equipment Recommendations  Other (comment)    Recommendations for Other Services Other (comment)     Precautions / Restrictions Precautions Precautions: Fall Restrictions Weight Bearing Restrictions: No      Mobility Bed Mobility Overal bed mobility: Needs Assistance Bed Mobility: Supine to Sit     Supine to sit: Supervision          Transfers Overall transfer level: Needs assistance Equipment used: Rolling walker (2 wheels) Transfers: Bed to chair/wheelchair/BSC Sit to Stand: Contact guard assist Stand pivot transfers: Contact guard assist         General transfer comment: transfers quickly      Balance Overall balance assessment: Needs assistance Sitting-balance support: Feet supported, No upper extremity supported Sitting balance-Leahy Scale: Good     Standing balance support: Single extremity supported, During functional activity (forward lean)                               ADL either performed or assessed with clinical judgement   ADL Overall ADL's : Needs assistance/impaired     Grooming: Wash/dry face;Wash/dry hands;Set up;Sitting                   Toilet Transfer: Contact guard assist;Cueing for sequencing;Cueing for safety;Stand-pivot Toilet Transfer Details (indicate cue type and reason): simulated bed > recliner, pt transfers quickly         Functional mobility during ADLs: Rolling walker (2 wheels) General ADL Comments: Pt limited by generalized weakness and decreased tolerance to activity. Appears to be near functional baseline for ADL performance. Currently at [facility] level.  Pertinent Vitals/Pain Pain Assessment Pain Assessment: 0-10 Pain Score: 4  Pain Location: "Everywhere" Pain Descriptors / Indicators: Aching, Discomfort, Dull Pain Intervention(s): Limited  activity within patient's tolerance, Monitored during session     Extremity/Trunk Assessment Upper Extremity Assessment Upper Extremity Assessment: Generalized weakness   Lower Extremity Assessment Lower Extremity Assessment: Generalized weakness       Communication Communication Communication: Difficulty communicating thoughts/reduced clarity of speech (some jumbled speech noted)   Cognition Arousal: Alert Behavior During Therapy: WFL for tasks assessed/performed Overall Cognitive Status: No family/caregiver present to determine baseline cognitive functioning                                 General Comments: Alert to name, DOB, year/month (states it is 29th Oct), location but not siutation     General Comments  Sp02 >95% throughout            Home Living Family/patient expects to be discharged to:: Skilled nursing facility                                 Additional Comments: Pt states she lives at [facility]      Prior Functioning/Environment Prior Level of Function : Needs assist;Patient poor historian/Family not available             Mobility Comments: pt reports using a RW, walks to bathroom ADLs Comments: Pt states she is IND in ADLs but later states she needs help for LB        OT Problem List: Decreased strength;Decreased activity tolerance;Impaired balance (sitting and/or standing);Decreased cognition;Decreased safety awareness;Cardiopulmonary status limiting activity      OT Treatment/Interventions: Self-care/ADL training;Therapeutic exercise;Energy conservation;DME and/or AE instruction;Therapeutic activities;Patient/family education;Balance training    OT Goals(Current goals can be found in the care plan section) Acute Rehab OT Goals OT Goal Formulation: With patient Time For Goal Achievement: 12/22/22 Potential to Achieve Goals: Good  OT Frequency: Min 1X/week       AM-PAC OT "6 Clicks" Daily Activity      Outcome Measure Help from another person eating meals?: None Help from another person taking care of personal grooming?: A Little Help from another person toileting, which includes using toliet, bedpan, or urinal?: A Little Help from another person bathing (including washing, rinsing, drying)?: A Little Help from another person to put on and taking off regular upper body clothing?: A Little Help from another person to put on and taking off regular lower body clothing?: A Little 6 Click Score: 19   End of Session Equipment Utilized During Treatment: Rolling walker (2 wheels) Nurse Communication: Mobility status;Other (comment) (pt reporting L ear bleeding (small scratch noted))  Activity Tolerance: Patient tolerated treatment well Patient left: Other (comment);in chair (handoff to PT)  OT Visit Diagnosis: Unsteadiness on feet (R26.81);Other abnormalities of gait and mobility (R26.89);Muscle weakness (generalized) (M62.81)                Time: 1610-9604 OT Time Calculation (min): 24 min Charges:  OT General Charges $OT Visit: 1 Visit OT Evaluation $OT Eval Low Complexity: 1 Low  Shacarra Choe L. Mylan Schwarz, OTR/L  12/08/22, 12:47 PM

## 2022-12-08 NOTE — Patient Outreach (Signed)
Care Coordination   Collaboration  Visit Note   12/08/2022 Name: Madison Burns MRN: [id number] DOB: 1951-01-23  Madison Burns is a 72 y.o. year old female who sees Dale [place], MD for primary care. Notification received from hospital liaison, Elliot Cousin, RN.   Patient lives at long term care facility, [facility].  Close to care coordination services.      Goals Addressed             This Visit's Progress    COMPLETED: Post hospital follow up / management of health conditions       Interventions Today    Flowsheet Row Most Recent Value  Chronic Disease   Chronic disease during today's visit Other  [Falls, Mysthenia gravis]  General Interventions   General Interventions Discussed/Reviewed General Interventions Reviewed, Doctor Visits  [evaluation of current treatment plan for falls, Myasthenia gravis and patients adherence to plan as established by provider. Assessed for pain level.]  Doctor Visits Discussed/Reviewed Doctor Visits Reviewed  Annabell Sabal upcoming/ scheduled provider visits. Confirmed patient has follow up  provider visit post ankle fracture.]  Education Interventions   Education Provided Provided Education  Provided Verbal Education On Other  [Discussed signs/ symptoms of infection. Advised to contact provider for infection symptoms. Advised to elevate leg when sitting. Stressed importance of patient walking with walker at all times.]  Pharmacy Interventions   Pharmacy Dicussed/Reviewed Pharmacy Topics Reviewed  [medications reviewed and compliance discussed.]  Safety Interventions   Safety Discussed/Reviewed Fall Risk  [assessed for additional falls.  Confirmed patient receiving  PT services with [facility] home [facility].]              SDOH assessments and interventions completed:  No     Care Coordination Interventions:  No, not indicated   Follow up plan: No further intervention required.   Encounter Outcome:  Patient Visit Completed    [facility] Care Coordination [phone] direct line

## 2022-12-09 DIAGNOSIS — G4733 Obstructive sleep apnea (adult) (pediatric): Secondary | ICD-10-CM | POA: Diagnosis not present

## 2022-12-09 DIAGNOSIS — G7000 Myasthenia gravis without (acute) exacerbation: Secondary | ICD-10-CM | POA: Diagnosis not present

## 2022-12-09 DIAGNOSIS — Z741 Need for assistance with personal care: Secondary | ICD-10-CM | POA: Diagnosis not present

## 2022-12-09 DIAGNOSIS — M1380 Other specified arthritis, unspecified site: Secondary | ICD-10-CM | POA: Diagnosis not present

## 2022-12-09 DIAGNOSIS — S62357D Nondisplaced fracture of shaft of fifth metacarpal bone, left hand, subsequent encounter for fracture with routine healing: Secondary | ICD-10-CM | POA: Diagnosis not present

## 2022-12-09 DIAGNOSIS — J441 Chronic obstructive pulmonary disease with (acute) exacerbation: Secondary | ICD-10-CM | POA: Diagnosis not present

## 2022-12-09 DIAGNOSIS — E871 Hypo-osmolality and hyponatremia: Secondary | ICD-10-CM | POA: Diagnosis not present

## 2022-12-09 DIAGNOSIS — L0390 Cellulitis, unspecified: Secondary | ICD-10-CM | POA: Diagnosis not present

## 2022-12-09 DIAGNOSIS — J9601 Acute respiratory failure with hypoxia: Secondary | ICD-10-CM | POA: Diagnosis not present

## 2022-12-10 ENCOUNTER — Telehealth: Payer: Self-pay | Admitting: Neurology

## 2022-12-10 DIAGNOSIS — G7000 Myasthenia gravis without (acute) exacerbation: Secondary | ICD-10-CM | POA: Diagnosis not present

## 2022-12-10 DIAGNOSIS — Z741 Need for assistance with personal care: Secondary | ICD-10-CM | POA: Diagnosis not present

## 2022-12-10 DIAGNOSIS — S62357D Nondisplaced fracture of shaft of fifth metacarpal bone, left hand, subsequent encounter for fracture with routine healing: Secondary | ICD-10-CM | POA: Diagnosis not present

## 2022-12-10 DIAGNOSIS — L0390 Cellulitis, unspecified: Secondary | ICD-10-CM | POA: Diagnosis not present

## 2022-12-10 DIAGNOSIS — M1380 Other specified arthritis, unspecified site: Secondary | ICD-10-CM | POA: Diagnosis not present

## 2022-12-10 LAB — CULTURE, BLOOD (ROUTINE X 2)
Culture: NO GROWTH
Culture: NO GROWTH
Special Requests: ADEQUATE

## 2022-12-10 NOTE — Telephone Encounter (Signed)
Pt's sister called wanting to inform the provider that instead of them waiting for a hand me down Cpap machine from their brother they would like for provider to send in a prescription to a DME and have it delivered to the pt. Please advise.

## 2022-12-10 NOTE — Telephone Encounter (Signed)
Contacted pt sister back, informed her per the sleep study and TE 04/01/21, the sleep study didn't indicate concerns for sleep apnea and she would no longer need CPAP. Insurance will require a sleep study to be completed and confirming apnea is present in order to cover it. Due to it not being medically necessary for apnea, the may not cover it. I advised she can discuss with [facility] to verify and also see how much it is out of pocket. She can also get the machine she was going to inherit, and we set the pressure settings. She said she would let us know which option she goes with. Sister was appreciative.

## 2022-12-11 DIAGNOSIS — G7000 Myasthenia gravis without (acute) exacerbation: Secondary | ICD-10-CM | POA: Diagnosis not present

## 2022-12-11 DIAGNOSIS — Z23 Encounter for immunization: Secondary | ICD-10-CM | POA: Diagnosis not present

## 2022-12-11 DIAGNOSIS — S62357D Nondisplaced fracture of shaft of fifth metacarpal bone, left hand, subsequent encounter for fracture with routine healing: Secondary | ICD-10-CM | POA: Diagnosis not present

## 2022-12-11 DIAGNOSIS — M1380 Other specified arthritis, unspecified site: Secondary | ICD-10-CM | POA: Diagnosis not present

## 2022-12-11 DIAGNOSIS — Z741 Need for assistance with personal care: Secondary | ICD-10-CM | POA: Diagnosis not present

## 2022-12-11 DIAGNOSIS — J441 Chronic obstructive pulmonary disease with (acute) exacerbation: Secondary | ICD-10-CM | POA: Diagnosis not present

## 2022-12-11 DIAGNOSIS — L0390 Cellulitis, unspecified: Secondary | ICD-10-CM | POA: Diagnosis not present

## 2022-12-12 DIAGNOSIS — G7000 Myasthenia gravis without (acute) exacerbation: Secondary | ICD-10-CM | POA: Diagnosis not present

## 2022-12-12 DIAGNOSIS — J441 Chronic obstructive pulmonary disease with (acute) exacerbation: Secondary | ICD-10-CM | POA: Diagnosis not present

## 2022-12-12 DIAGNOSIS — J9811 Atelectasis: Secondary | ICD-10-CM | POA: Diagnosis not present

## 2022-12-12 DIAGNOSIS — Z741 Need for assistance with personal care: Secondary | ICD-10-CM | POA: Diagnosis not present

## 2022-12-12 DIAGNOSIS — D72829 Elevated white blood cell count, unspecified: Secondary | ICD-10-CM | POA: Diagnosis not present

## 2022-12-12 DIAGNOSIS — M1380 Other specified arthritis, unspecified site: Secondary | ICD-10-CM | POA: Diagnosis not present

## 2022-12-12 DIAGNOSIS — S62357D Nondisplaced fracture of shaft of fifth metacarpal bone, left hand, subsequent encounter for fracture with routine healing: Secondary | ICD-10-CM | POA: Diagnosis not present

## 2022-12-12 DIAGNOSIS — E871 Hypo-osmolality and hyponatremia: Secondary | ICD-10-CM | POA: Diagnosis not present

## 2022-12-12 DIAGNOSIS — J9601 Acute respiratory failure with hypoxia: Secondary | ICD-10-CM | POA: Diagnosis not present

## 2022-12-12 DIAGNOSIS — L0390 Cellulitis, unspecified: Secondary | ICD-10-CM | POA: Diagnosis not present

## 2022-12-15 ENCOUNTER — Encounter: Payer: Self-pay | Admitting: Neurology

## 2022-12-15 ENCOUNTER — Telehealth: Payer: Self-pay | Admitting: Neurology

## 2022-12-15 DIAGNOSIS — S62357D Nondisplaced fracture of shaft of fifth metacarpal bone, left hand, subsequent encounter for fracture with routine healing: Secondary | ICD-10-CM | POA: Diagnosis not present

## 2022-12-15 DIAGNOSIS — M1380 Other specified arthritis, unspecified site: Secondary | ICD-10-CM | POA: Diagnosis not present

## 2022-12-15 DIAGNOSIS — Z741 Need for assistance with personal care: Secondary | ICD-10-CM | POA: Diagnosis not present

## 2022-12-15 DIAGNOSIS — G7000 Myasthenia gravis without (acute) exacerbation: Secondary | ICD-10-CM | POA: Diagnosis not present

## 2022-12-15 DIAGNOSIS — L0390 Cellulitis, unspecified: Secondary | ICD-10-CM | POA: Diagnosis not present

## 2022-12-15 NOTE — Telephone Encounter (Signed)
LVM and sent letter in the mail informing pt of need to reschedule 01/12/23 appt - MD out

## 2022-12-16 DIAGNOSIS — L0390 Cellulitis, unspecified: Secondary | ICD-10-CM | POA: Diagnosis not present

## 2022-12-16 DIAGNOSIS — S62357D Nondisplaced fracture of shaft of fifth metacarpal bone, left hand, subsequent encounter for fracture with routine healing: Secondary | ICD-10-CM | POA: Diagnosis not present

## 2022-12-16 DIAGNOSIS — Z741 Need for assistance with personal care: Secondary | ICD-10-CM | POA: Diagnosis not present

## 2022-12-16 DIAGNOSIS — G7000 Myasthenia gravis without (acute) exacerbation: Secondary | ICD-10-CM | POA: Diagnosis not present

## 2022-12-16 DIAGNOSIS — M1380 Other specified arthritis, unspecified site: Secondary | ICD-10-CM | POA: Diagnosis not present

## 2022-12-17 DIAGNOSIS — L0390 Cellulitis, unspecified: Secondary | ICD-10-CM | POA: Diagnosis not present

## 2022-12-17 DIAGNOSIS — Z741 Need for assistance with personal care: Secondary | ICD-10-CM | POA: Diagnosis not present

## 2022-12-17 DIAGNOSIS — G7000 Myasthenia gravis without (acute) exacerbation: Secondary | ICD-10-CM | POA: Diagnosis not present

## 2022-12-17 DIAGNOSIS — M1380 Other specified arthritis, unspecified site: Secondary | ICD-10-CM | POA: Diagnosis not present

## 2022-12-17 DIAGNOSIS — S62357D Nondisplaced fracture of shaft of fifth metacarpal bone, left hand, subsequent encounter for fracture with routine healing: Secondary | ICD-10-CM | POA: Diagnosis not present

## 2022-12-18 ENCOUNTER — Ambulatory Visit: Payer: 59 | Admitting: Internal Medicine

## 2022-12-18 VITALS — BP 122/70 | HR 79 | Ht 64.0 in

## 2022-12-18 DIAGNOSIS — G4733 Obstructive sleep apnea (adult) (pediatric): Secondary | ICD-10-CM | POA: Diagnosis not present

## 2022-12-18 DIAGNOSIS — S62357D Nondisplaced fracture of shaft of fifth metacarpal bone, left hand, subsequent encounter for fracture with routine healing: Secondary | ICD-10-CM | POA: Diagnosis not present

## 2022-12-18 DIAGNOSIS — D6481 Anemia due to antineoplastic chemotherapy: Secondary | ICD-10-CM

## 2022-12-18 DIAGNOSIS — D696 Thrombocytopenia, unspecified: Secondary | ICD-10-CM

## 2022-12-18 DIAGNOSIS — D4989 Neoplasm of unspecified behavior of other specified sites: Secondary | ICD-10-CM

## 2022-12-18 DIAGNOSIS — J189 Pneumonia, unspecified organism: Secondary | ICD-10-CM

## 2022-12-18 DIAGNOSIS — I10 Essential (primary) hypertension: Secondary | ICD-10-CM

## 2022-12-18 DIAGNOSIS — C642 Malignant neoplasm of left kidney, except renal pelvis: Secondary | ICD-10-CM | POA: Diagnosis not present

## 2022-12-18 DIAGNOSIS — L0390 Cellulitis, unspecified: Secondary | ICD-10-CM | POA: Diagnosis not present

## 2022-12-18 DIAGNOSIS — E039 Hypothyroidism, unspecified: Secondary | ICD-10-CM | POA: Diagnosis not present

## 2022-12-18 DIAGNOSIS — M1380 Other specified arthritis, unspecified site: Secondary | ICD-10-CM | POA: Diagnosis not present

## 2022-12-18 DIAGNOSIS — E871 Hypo-osmolality and hyponatremia: Secondary | ICD-10-CM

## 2022-12-18 DIAGNOSIS — C649 Malignant neoplasm of unspecified kidney, except renal pelvis: Secondary | ICD-10-CM

## 2022-12-18 DIAGNOSIS — T451X5A Adverse effect of antineoplastic and immunosuppressive drugs, initial encounter: Secondary | ICD-10-CM

## 2022-12-18 DIAGNOSIS — G7000 Myasthenia gravis without (acute) exacerbation: Secondary | ICD-10-CM | POA: Diagnosis not present

## 2022-12-18 DIAGNOSIS — R739 Hyperglycemia, unspecified: Secondary | ICD-10-CM

## 2022-12-18 DIAGNOSIS — I480 Paroxysmal atrial fibrillation: Secondary | ICD-10-CM

## 2022-12-18 DIAGNOSIS — Z741 Need for assistance with personal care: Secondary | ICD-10-CM | POA: Diagnosis not present

## 2022-12-18 DIAGNOSIS — I700 Atherosclerosis of aorta: Secondary | ICD-10-CM

## 2022-12-18 DIAGNOSIS — E7800 Pure hypercholesterolemia, unspecified: Secondary | ICD-10-CM

## 2022-12-18 DIAGNOSIS — G40909 Epilepsy, unspecified, not intractable, without status epilepticus: Secondary | ICD-10-CM

## 2022-12-18 DIAGNOSIS — F39 Unspecified mood [affective] disorder: Secondary | ICD-10-CM

## 2022-12-18 LAB — BASIC METABOLIC PANEL
BUN: 19 mg/dL (ref 6–23)
CO2: 32 meq/L (ref 19–32)
Calcium: 8.7 mg/dL (ref 8.4–10.5)
Chloride: 100 meq/L (ref 96–112)
Creatinine, Ser: 1 mg/dL (ref 0.40–1.20)
GFR: 56.67 mL/min — ABNORMAL LOW (ref >60.00)
Glucose, Bld: 116 mg/dL — ABNORMAL HIGH (ref 70–99)
Potassium: 3.7 meq/L (ref 3.5–5.1)
Sodium: 139 meq/L (ref 135–145)

## 2022-12-18 LAB — TSH: TSH: 2.94 u[IU]/mL (ref 0.35–5.50)

## 2022-12-18 LAB — MAGNESIUM: Magnesium: 2 mg/dL (ref 1.5–2.5)

## 2022-12-20 ENCOUNTER — Encounter: Payer: Self-pay | Admitting: Internal Medicine

## 2022-12-20 ENCOUNTER — Telehealth: Payer: Self-pay | Admitting: Internal Medicine

## 2022-12-20 DIAGNOSIS — J189 Pneumonia, unspecified organism: Secondary | ICD-10-CM

## 2022-12-20 NOTE — Assessment & Plan Note (Signed)
Has declined cholesterol medication.  Low cholesterol diet and exercise.  Follow lipid panel.   

## 2022-12-20 NOTE — Assessment & Plan Note (Signed)
Recheck magnesium to confirm wnl.

## 2022-12-20 NOTE — Assessment & Plan Note (Signed)
Followed by neurology.  Continue cpap.

## 2022-12-20 NOTE — Assessment & Plan Note (Signed)
Followed by oncology 

## 2022-12-20 NOTE — Assessment & Plan Note (Signed)
Blood pressure has been doing well. On oral cardizem.  Follow pressures and metabolic panel.  

## 2022-12-20 NOTE — Assessment & Plan Note (Signed)
Low carb diet and exercise.  Follow met b and a1c.   Lab Results  Component Value Date   HGBA1C 5.6 01/08/2022   

## 2022-12-20 NOTE — Assessment & Plan Note (Signed)
Has declined cholesterol medication.   

## 2022-12-20 NOTE — Assessment & Plan Note (Signed)
Renal cell cancer - f/u Dr Apolinar Junes 05/28/22  - Lesion appears stable, post ablative changes without recurrence - Plan to continue annual surveillance with MRI of the abdomen with contrast for a total of five years post-treatment, acknowledging this is year three.   Return in about 1 year (around 05/28/2023) for repeat MRI abdomen.

## 2022-12-20 NOTE — Assessment & Plan Note (Signed)
Followed by neurology.  Stable  

## 2022-12-20 NOTE — Assessment & Plan Note (Signed)
Noted to have low sodium in the hospital.  Recheck sodium today.

## 2022-12-20 NOTE — Assessment & Plan Note (Signed)
On thyroid replacement.  Follow tsh.  

## 2022-12-20 NOTE — Assessment & Plan Note (Signed)
Neurology 12/2021 - continue mestinon, methotrexate, folic acid and DDVAP.

## 2022-12-20 NOTE — Assessment & Plan Note (Addendum)
Recently admitted with pneumonia as outlined.  Will need f/u xray.  Too soon.  Will need to arrange.  No increased cough or congestion currently.  Reports no increased sob.  She is living at [facility].  Working with PT.  Walking with her walker  - with assistance.

## 2022-12-20 NOTE — Assessment & Plan Note (Signed)
Psychiatry.  Continue risperdal.

## 2022-12-20 NOTE — Assessment & Plan Note (Signed)
Followed by oncology.  S/p chemo and XRT.   °

## 2022-12-20 NOTE — Telephone Encounter (Signed)
Was recently admitted with pneumonia.  Will need a f/u cxr in 4-6 weeks. She is currently at [facility]. Will need f/u cxr at [facility] (if possible) in 4-6 weeks.  Order in for cxr.

## 2022-12-20 NOTE — Assessment & Plan Note (Signed)
Continues on amiodarone and oral diltiazem.  Hold on further anticoagulation given recent falls.  Follow.  Appears to be in SR today on exam.

## 2022-12-22 ENCOUNTER — Telehealth: Payer: Self-pay

## 2022-12-22 NOTE — Telephone Encounter (Signed)
Lvm for pt to give office a call back in regards to labs see msg below

## 2022-12-22 NOTE — Telephone Encounter (Signed)
Called [facility]. Was unable to be transferred to a nurse.

## 2022-12-22 NOTE — Telephone Encounter (Signed)
-----   Message from [place] sent at 12/19/2022  5:11 AM EDT ----- Notify - sodium level is wnl. Calcium wnl. Magnesium and thyroid test wnl.  (Pt now residing at [facility]).

## 2022-12-23 NOTE — Telephone Encounter (Signed)
[facility] will be repeating cxr. She is being followed by the NP at [facility].

## 2022-12-25 ENCOUNTER — Telehealth: Payer: Self-pay

## 2022-12-25 NOTE — Telephone Encounter (Signed)
Patient's sister would like call back from [place], stated she had some more information to add on to what she spoke with Baird Lyons about earlier today. CB [phone]

## 2022-12-25 NOTE — Telephone Encounter (Signed)
Patients sister called in and is asking for a call back. States she has a resmed machine she would like to start her sister on. She is asking for a call back to discuss this.

## 2022-12-25 NOTE — Telephone Encounter (Signed)
Called the patient's sister and she states that she received the CPAP from their family member and is wanting to see about the patient having it set up. Advised that it looked like we has sent orders to [facility]. They are in a nursing home and so I advised I am unsure if they will come there or not but that I would send a message to inquire. She will also check in with [facility]. She was appreciative for the call back

## 2022-12-25 NOTE — Telephone Encounter (Signed)
Called the sister back and she states the nursing home would like for [place] to fax the sleep study and order to their location. They are currently residing at [facility] in [place] The fax number is [phone]

## 2023-01-12 ENCOUNTER — Ambulatory Visit: Payer: 59 | Admitting: Neurology

## 2023-02-17 ENCOUNTER — Ambulatory Visit: Payer: Medicare Other | Admitting: Podiatry

## 2023-02-20 ENCOUNTER — Telehealth: Payer: Self-pay

## 2023-02-20 NOTE — Telephone Encounter (Signed)
 Pt in facility. Not followed by Dr Lorin Picket

## 2023-02-20 NOTE — Telephone Encounter (Signed)
 Copied from CRM [id number]. Topic: General - Other >> Feb 20, 2023  1:49 PM Joen B wrote: Reason for CRM: TIFFANY FROM [facility] CALLED WANTING TO KNOW IF AN ADDENDUM BE ADDED TO A  DOCTORS NOTE. AS I WAS GETTING IT ROUTED THE CALLED DROPPED. I CALLED BACK NO ANSWER. SHE NEEDED INCONTINENCE ADDED TO THE LAST OFFICE VISIT NOTE. SHE CALLED FROM [phone]

## 2023-03-05 ENCOUNTER — Ambulatory Visit: Payer: Medicare Other | Admitting: Podiatry

## 2023-03-12 ENCOUNTER — Ambulatory Visit: Payer: Medicare Other | Admitting: Podiatry

## 2023-05-15 ENCOUNTER — Other Ambulatory Visit: Payer: 59

## 2023-05-15 ENCOUNTER — Ambulatory Visit: Payer: 59 | Admitting: Oncology

## 2023-05-15 ENCOUNTER — Ambulatory Visit
Admission: RE | Admit: 2023-05-15 | Discharge: 2023-05-15 | Disposition: A | Discharge status: Home / Self Care | Payer: 59 | Source: Ambulatory Visit | Attending: Oncology | Admitting: Oncology

## 2023-05-15 DIAGNOSIS — C642 Malignant neoplasm of left kidney, except renal pelvis: Secondary | ICD-10-CM | POA: Insufficient documentation

## 2023-05-15 LAB — POCT I-STAT CREATININE: Creatinine, Ser: 1.3 mg/dL — ABNORMAL HIGH (ref 0.44–1.00)

## 2023-05-15 MED ORDER — IOHEXOL 300 MG/ML  SOLN
75.0000 mL | Freq: Once | INTRAMUSCULAR | Status: AC | PRN
  Administered 2023-05-15: 75 mL via INTRAVENOUS

## 2023-05-21 ENCOUNTER — Emergency Department

## 2023-05-21 ENCOUNTER — Other Ambulatory Visit: Payer: Self-pay

## 2023-05-21 ENCOUNTER — Emergency Department
Admission: EM | Admit: 2023-05-21 | Discharge: 2023-05-22 | Disposition: A | Discharge status: Home / Self Care | Attending: Emergency Medicine | Admitting: Emergency Medicine

## 2023-05-21 DIAGNOSIS — S72402A Unspecified fracture of lower end of left femur, initial encounter for closed fracture: Principal | ICD-10-CM | POA: Insufficient documentation

## 2023-05-21 DIAGNOSIS — H5711 Ocular pain, right eye: Secondary | ICD-10-CM | POA: Insufficient documentation

## 2023-05-21 DIAGNOSIS — J449 Chronic obstructive pulmonary disease, unspecified: Secondary | ICD-10-CM | POA: Insufficient documentation

## 2023-05-21 DIAGNOSIS — M25562 Pain in left knee: Secondary | ICD-10-CM | POA: Insufficient documentation

## 2023-05-21 DIAGNOSIS — R519 Headache, unspecified: Secondary | ICD-10-CM | POA: Insufficient documentation

## 2023-05-21 DIAGNOSIS — Y92019 Unspecified place in single-family (private) house as the place of occurrence of the external cause: Secondary | ICD-10-CM | POA: Diagnosis not present

## 2023-05-21 DIAGNOSIS — S79912A Unspecified injury of left hip, initial encounter: Secondary | ICD-10-CM | POA: Diagnosis present

## 2023-05-21 DIAGNOSIS — S72002A Fracture of unspecified part of neck of left femur, initial encounter for closed fracture: Secondary | ICD-10-CM | POA: Diagnosis present

## 2023-05-21 MED ORDER — MORPHINE SULFATE (PF) 4 MG/ML IV SOLN
4.0000 mg | Freq: Once | INTRAVENOUS | Status: AC
Start: 2023-05-22 — End: 2023-05-22
  Administered 2023-05-22: 4 mg via INTRAVENOUS
  Filled 2023-05-21: qty 1

## 2023-05-21 NOTE — ED Triage Notes (Signed)
 Pt presenting to ED from [facility]. Pt reports she was attempting to get up from wheelchair to go to the bathroom and fell. Per EMS, her facility called mobile XR which showed L femur fx. Pt L leg does appear shortened. Pt also noted to have hematoma around R eye. AAOx4

## 2023-05-21 NOTE — ED Provider Notes (Signed)
 [facility] Provider Note   Event Date/Time   First MD Initiated Contact with Patient 05/21/23 2331     (approximate) History  Fall  HPI Madison Burns is a 73 y.o. female with past medical history of myasthenia gravis, atrial fibrillation, COPD who presents after an unwitnessed fall at a group home.  Patient states that she was attempting to get up from a wheelchair to go to the bathroom when she fell onto her left side resulting in left lower extremity pain from the left hip to the knee.  Per EMS, the facility called and mobile x-ray that showed a left femur fracture.  Patient also complains of pain around the right eye and forehead.  Patient is unsure whether she takes blood thinning medicines or not. ROS: Patient currently denies any vision changes, tinnitus, difficulty speaking, facial droop, sore throat, chest pain, shortness of breath, abdominal pain, nausea/vomiting/diarrhea, dysuria, or weakness/numbness/paresthesias in any extremity   Physical Exam  Triage Vital Signs: ED Triage Vitals  Encounter Vitals Group     BP 05/21/23 2333 116/70     Systolic BP Percentile --      Diastolic BP Percentile --      Pulse Rate 05/21/23 2333 81     Resp 05/21/23 2333 18     Temp 05/21/23 2333 98.3 F (36.8 C)     Temp Source 05/21/23 2333 Oral     SpO2 05/21/23 2331 98 %     Weight 05/21/23 2334 245 lb 14.4 oz (111.5 kg)     Height 05/21/23 2334 5\' 7"  (1.702 m)     Head Circumference --      [facility] Flow --      Pain Score 05/21/23 2334 7     Pain Loc --      Pain Education --      Exclude from Growth Chart --    Most recent vital signs: Vitals:   05/22/23 0131 05/22/23 0300  BP: (!) 121/52 114/65  Pulse: 83 86  Resp: (!) 22 19  Temp:    SpO2: 95% 93%   General: Awake, oriented x4. CV:  Good peripheral perfusion.  Resp:  Normal effort.  Abd:  No distention.  Other:  Elderly obese Caucasian female resting comfortably in no acute distress.  Left leg  shortened and externally rotated with tenderness to palpation over the left knee to the left hip ED Results / Procedures / Treatments  Labs (all labs ordered are listed, but only abnormal results are displayed) Labs Reviewed  COMPREHENSIVE METABOLIC PANEL WITH GFR - Abnormal; Notable for the following components:      Result Value   Potassium 3.4 (*)    Glucose, Bld 150 (*)    BUN 25 (*)    Creatinine, Ser 1.29 (*)    Calcium 8.7 (*)    Total Protein 5.8 (*)    Albumin 3.1 (*)    GFR, Estimated 44 (*)    All other components within normal limits  CBC WITH DIFFERENTIAL/PLATELET - Abnormal; Notable for the following components:   WBC 14.2 (*)    Hemoglobin 11.6 (*)    HCT 35.1 (*)    RDW 17.2 (*)    Neutro Abs 12.3 (*)    All other components within normal limits  PROTIME-INR   EKG ED ECG REPORT I, Merwyn Katos, the attending physician, personally viewed and interpreted this ECG. Date: 05/21/2023 EKG Time: 0056 Rate: 82 Rhythm: normal sinus rhythm QRS Axis:  normal Intervals: normal ST/T Wave abnormalities: normal Narrative Interpretation: no evidence of acute ischemia RADIOLOGY ED MD interpretation: CT of the head without contrast interpreted by me shows no evidence of acute abnormalities including no intracerebral hemorrhage, obvious masses, or significant edema CT of the cervical spine interpreted by me does not show any evidence of acute abnormalities including no acute fracture, malalignment, height loss, or dislocation CT of the maxillofacial structures shows no evidence of acute abnormalities X-ray of the left knee and hip shows an acute oblique intra-articular displaced fracture of the distal left femoral metadiaphysis.  This imaging was independently interpreted -Agree with radiology assessment Official radiology report(s): DG Hip Unilat W or Wo Pelvis 2-3 Views Left Result Date: 05/22/2023 CLINICAL DATA:  Fall with left leg pain from hip to knee. EXAM: DG HIP (WITH  OR WITHOUT PELVIS) 2-3V LEFT; LEFT KNEE - COMPLETE 4+ VIEW COMPARISON:  None Available. FINDINGS: Acute oblique intra-articular displaced fracture of the distal left femoral metadiaphysis. Adjacent soft tissue swelling. No pelvic fracture. IMPRESSION: 1. Acute oblique intra-articular displaced fracture of the distal left femoral metadiaphysis. Electronically Signed   By: Minerva Fester M.D.   On: 05/22/2023 00:48   DG Knee Complete 4 Views Left Result Date: 05/22/2023 CLINICAL DATA:  Fall with left leg pain from hip to knee. EXAM: DG HIP (WITH OR WITHOUT PELVIS) 2-3V LEFT; LEFT KNEE - COMPLETE 4+ VIEW COMPARISON:  None Available. FINDINGS: Acute oblique intra-articular displaced fracture of the distal left femoral metadiaphysis. Adjacent soft tissue swelling. No pelvic fracture. IMPRESSION: 1. Acute oblique intra-articular displaced fracture of the distal left femoral metadiaphysis. Electronically Signed   By: Minerva Fester M.D.   On: 05/22/2023 00:48   CT Maxillofacial WO CM Result Date: 05/22/2023 CLINICAL DATA:  Facial trauma, blunt.  Fall. EXAM: CT MAXILLOFACIAL WITHOUT CONTRAST TECHNIQUE: Multidetector CT imaging of the maxillofacial structures was performed. Multiplanar CT image reconstructions were also generated. RADIATION DOSE REDUCTION: This exam was performed according to the departmental dose-optimization program which includes automated exposure control, adjustment of the mA and/or kV according to patient size and/or use of iterative reconstruction technique. COMPARISON:  None Available. FINDINGS: Osseous: No fracture or mandibular dislocation. No destructive process. Orbits: No fracture or orbital emphysema.  Globes intact. Sinuses: No air-fluid levels. Soft tissues: Soft tissue swelling noted over the lateral right orbit and face. Limited intracranial: See head CT report IMPRESSION: No facial or orbital fracture. Electronically Signed   By: Charlett Nose M.D.   On: 05/22/2023 00:18   CT  Cervical Spine Wo Contrast Result Date: 05/22/2023 CLINICAL DATA:  Neck trauma (Age >= 65y).  Fall. EXAM: CT CERVICAL SPINE WITHOUT CONTRAST TECHNIQUE: Multidetector CT imaging of the cervical spine was performed without intravenous contrast. Multiplanar CT image reconstructions were also generated. RADIATION DOSE REDUCTION: This exam was performed according to the departmental dose-optimization program which includes automated exposure control, adjustment of the mA and/or kV according to patient size and/or use of iterative reconstruction technique. COMPARISON:  06/02/2022 FINDINGS: Alignment: No subluxation. Skull base and vertebrae: No acute fracture. No primary bone lesion or focal pathologic process. Soft tissues and spinal canal: No prevertebral fluid or swelling. No visible canal hematoma. Disc levels: Diffuse degenerative disc disease with disc space narrowing and anterior spurring most pronounced at C4-5 and C5-6. Mild bilateral degenerative facet disease. Upper chest: Chronic right medial upper lobe platelike airspace opacity, likely scarring from radiation. This is unchanged dating back to 05/12/2022. No acute findings. Other: None IMPRESSION: Degenerative disc and  facet disease.  No acute bony abnormality. Electronically Signed   By: Charlett Nose M.D.   On: 05/22/2023 00:16   CT Head Wo Contrast Result Date: 05/22/2023 CLINICAL DATA:  Head trauma, minor (Age >= 65y), fall EXAM: CT HEAD WITHOUT CONTRAST TECHNIQUE: Contiguous axial images were obtained from the base of the skull through the vertex without intravenous contrast. RADIATION DOSE REDUCTION: This exam was performed according to the departmental dose-optimization program which includes automated exposure control, adjustment of the mA and/or kV according to patient size and/or use of iterative reconstruction technique. COMPARISON:  06/02/2022 FINDINGS: Brain: There is atrophy and chronic small vessel disease changes. No acute intracranial  abnormality. Specifically, no hemorrhage, hydrocephalus, mass lesion, acute infarction, or significant intracranial injury. Vascular: No hyperdense vessel or unexpected calcification. Skull: No acute calvarial abnormality. Sinuses/Orbits: No acute findings Other: None IMPRESSION: Atrophy, chronic microvascular disease. No acute intracranial abnormality. Electronically Signed   By: Charlett Nose M.D.   On: 05/22/2023 00:13   PROCEDURES: Critical Care performed: No .1-3 Lead EKG Interpretation  Performed by: Merwyn Katos, MD Authorized by: Merwyn Katos, MD     Interpretation: normal     ECG rate:  81   ECG rate assessment: normal     Rhythm: sinus rhythm     Ectopy: none     Conduction: normal    MEDICATIONS ORDERED IN ED: Medications  HYDROmorphone (DILAUDID) injection 1 mg (1 mg Intravenous Given 05/22/23 0313)  morphine (PF) 4 MG/ML injection 4 mg (4 mg Intravenous Given 05/22/23 0049)   IMPRESSION / MDM / ASSESSMENT AND PLAN / ED COURSE  I reviewed the triage vital signs and the nursing notes.                             The patient is on the cardiac monitor to evaluate for evidence of arrhythmia and/or significant heart rate changes. Patient's presentation is most consistent with acute presentation with potential threat to life or bodily function. The Pt was found to have a closed left distal femur fracture on XR. The Pt is otherwise well appearing, hemodynamically stable, and shows no evidence of neurovascular injury or compartment syndrome.  I have low suspicion for dislocation, significant ligamentous injury, septic arthritis, gout flare, new autoimmune arthropathy, or gonococcal arthropathy. Given the severe of this injury, Dr. Jamelle Rushing he recommends transport to orthopedic trauma facility.  I have spoken to [facility] orthopedics who stated that they do not have any trauma coverage for the next week and to try a different hospital.  [facility] refused due to bed availability.  [facility] has  accepted under a Dr. Carleene Overlie  Dispo: Transfer to [facility]   FINAL CLINICAL IMPRESSION(S) / ED DIAGNOSES   Final diagnoses:  Closed fracture of distal end of left femur, unspecified fracture morphology, initial encounter [facility])   Rx / DC Orders   ED Discharge Orders     None      Note:  This document was prepared using Dragon voice recognition software and may include unintentional dictation errors.   Merwyn Katos, MD 05/22/23 [phone]

## 2023-05-22 ENCOUNTER — Ambulatory Visit: Payer: 59 | Admitting: Oncology

## 2023-05-22 ENCOUNTER — Other Ambulatory Visit: Payer: 59

## 2023-05-22 ENCOUNTER — Emergency Department

## 2023-05-22 DIAGNOSIS — S72002A Fracture of unspecified part of neck of left femur, initial encounter for closed fracture: Secondary | ICD-10-CM | POA: Diagnosis present

## 2023-05-22 LAB — CBC WITH DIFFERENTIAL/PLATELET
Abs Immature Granulocytes: 0.07 10*3/uL (ref 0.00–0.07)
Basophils Absolute: 0 10*3/uL (ref 0.0–0.1)
Basophils Relative: 0 %
Eosinophils Absolute: 0.1 10*3/uL (ref 0.0–0.5)
Eosinophils Relative: 1 %
HCT: 35.1 % — ABNORMAL LOW (ref 36.0–46.0)
Hemoglobin: 11.6 g/dL — ABNORMAL LOW (ref 12.0–15.0)
Immature Granulocytes: 1 %
Lymphocytes Relative: 5 %
Lymphs Abs: 0.8 10*3/uL (ref 0.7–4.0)
MCH: 29.4 pg (ref 26.0–34.0)
MCHC: 33 g/dL (ref 30.0–36.0)
MCV: 88.9 fL (ref 80.0–100.0)
Monocytes Absolute: 0.9 10*3/uL (ref 0.1–1.0)
Monocytes Relative: 7 %
Neutro Abs: 12.3 10*3/uL — ABNORMAL HIGH (ref 1.7–7.7)
Neutrophils Relative %: 86 %
Platelets: 214 10*3/uL (ref 150–400)
RBC: 3.95 MIL/uL (ref 3.87–5.11)
RDW: 17.2 % — ABNORMAL HIGH (ref 11.5–15.5)
WBC: 14.2 10*3/uL — ABNORMAL HIGH (ref 4.0–10.5)
nRBC: 0 % (ref 0.0–0.2)

## 2023-05-22 LAB — COMPREHENSIVE METABOLIC PANEL WITH GFR
ALT: 25 U/L (ref 0–44)
AST: 29 U/L (ref 15–41)
Albumin: 3.1 g/dL — ABNORMAL LOW (ref 3.5–5.0)
Alkaline Phosphatase: 61 U/L (ref 38–126)
Anion gap: 10 (ref 5–15)
BUN: 25 mg/dL — ABNORMAL HIGH (ref 8–23)
CO2: 28 mmol/L (ref 22–32)
Calcium: 8.7 mg/dL — ABNORMAL LOW (ref 8.9–10.3)
Chloride: 100 mmol/L (ref 98–111)
Creatinine, Ser: 1.29 mg/dL — ABNORMAL HIGH (ref 0.44–1.00)
GFR, Estimated: 44 mL/min — ABNORMAL LOW (ref >60)
Glucose, Bld: 150 mg/dL — ABNORMAL HIGH (ref 70–99)
Potassium: 3.4 mmol/L — ABNORMAL LOW (ref 3.5–5.1)
Sodium: 138 mmol/L (ref 135–145)
Total Bilirubin: 0.4 mg/dL (ref 0.0–1.2)
Total Protein: 5.8 g/dL — ABNORMAL LOW (ref 6.5–8.1)

## 2023-05-22 LAB — PROTIME-INR
INR: 1.1 (ref 0.8–1.2)
Prothrombin Time: 14 s (ref 11.4–15.2)

## 2023-05-22 MED ORDER — HYDROMORPHONE HCL 1 MG/ML IJ SOLN
1.0000 mg | INTRAMUSCULAR | Status: DC | PRN
  Administered 2023-05-22: 1 mg via INTRAVENOUS
  Filled 2023-05-22: qty 1

## 2023-05-22 NOTE — ED Notes (Signed)
 Pt placed on 2L [place] after dilaudid administration d/t SpO2 dropping to 88%. SpO2 96% after placing pt on O2

## 2023-05-22 NOTE — ED Notes (Signed)
 Attempted x3 to call [facility] ED Charge RN to give report. [facility] here to take pt.   Pt removed off O2 prior to transports arrival and satting 92-94% on RA.

## 2023-05-22 NOTE — ED Notes (Signed)
 [facility] notified that [facility] will transport patient

## 2023-05-22 NOTE — ED Notes (Signed)
 [place] reviewed by this RN, consent signed.

## 2023-05-22 NOTE — ED Notes (Signed)
 Pt left at this time with [facility]

## 2023-05-27 ENCOUNTER — Emergency Department

## 2023-05-27 ENCOUNTER — Ambulatory Visit: Payer: Self-pay | Admitting: Urology

## 2023-05-27 ENCOUNTER — Emergency Department
Admission: EM | Admit: 2023-05-27 | Discharge: 2023-05-27 | Disposition: A | Discharge status: Home / Self Care | Attending: Emergency Medicine | Admitting: Emergency Medicine

## 2023-05-27 ENCOUNTER — Other Ambulatory Visit: Payer: Self-pay

## 2023-05-27 DIAGNOSIS — R0602 Shortness of breath: Secondary | ICD-10-CM | POA: Insufficient documentation

## 2023-05-27 DIAGNOSIS — R079 Chest pain, unspecified: Secondary | ICD-10-CM | POA: Insufficient documentation

## 2023-05-27 LAB — CBC WITH DIFFERENTIAL/PLATELET
Abs Immature Granulocytes: 0.17 10*3/uL — ABNORMAL HIGH (ref 0.00–0.07)
Basophils Absolute: 0 10*3/uL (ref 0.0–0.1)
Basophils Relative: 0 %
Eosinophils Absolute: 0.2 10*3/uL (ref 0.0–0.5)
Eosinophils Relative: 2 %
HCT: 27.9 % — ABNORMAL LOW (ref 36.0–46.0)
Hemoglobin: 8.8 g/dL — ABNORMAL LOW (ref 12.0–15.0)
Immature Granulocytes: 2 %
Lymphocytes Relative: 9 %
Lymphs Abs: 0.8 10*3/uL (ref 0.7–4.0)
MCH: 28.1 pg (ref 26.0–34.0)
MCHC: 31.5 g/dL (ref 30.0–36.0)
MCV: 89.1 fL (ref 80.0–100.0)
Monocytes Absolute: 0.8 10*3/uL (ref 0.1–1.0)
Monocytes Relative: 9 %
Neutro Abs: 6.6 10*3/uL (ref 1.7–7.7)
Neutrophils Relative %: 78 %
Platelets: 220 10*3/uL (ref 150–400)
RBC: 3.13 MIL/uL — ABNORMAL LOW (ref 3.87–5.11)
RDW: 20 % — ABNORMAL HIGH (ref 11.5–15.5)
WBC: 8.5 10*3/uL (ref 4.0–10.5)
nRBC: 0.2 % (ref 0.0–0.2)

## 2023-05-27 LAB — COMPREHENSIVE METABOLIC PANEL WITH GFR
ALT: 21 U/L (ref 0–44)
AST: 35 U/L (ref 15–41)
Albumin: 2.4 g/dL — ABNORMAL LOW (ref 3.5–5.0)
Alkaline Phosphatase: 45 U/L (ref 38–126)
Anion gap: 6 (ref 5–15)
BUN: 26 mg/dL — ABNORMAL HIGH (ref 8–23)
CO2: 30 mmol/L (ref 22–32)
Calcium: 8.2 mg/dL — ABNORMAL LOW (ref 8.9–10.3)
Chloride: 100 mmol/L (ref 98–111)
Creatinine, Ser: 0.94 mg/dL (ref 0.44–1.00)
GFR, Estimated: >60 mL/min (ref >60)
Glucose, Bld: 111 mg/dL — ABNORMAL HIGH (ref 70–99)
Potassium: 4.6 mmol/L (ref 3.5–5.1)
Sodium: 136 mmol/L (ref 135–145)
Total Bilirubin: 0.8 mg/dL (ref 0.0–1.2)
Total Protein: 5.6 g/dL — ABNORMAL LOW (ref 6.5–8.1)

## 2023-05-27 LAB — LIPASE, BLOOD: Lipase: 33 U/L (ref 11–51)

## 2023-05-27 LAB — TROPONIN I (HIGH SENSITIVITY)
Troponin I (High Sensitivity): 9 ng/L (ref <18)
Troponin I (High Sensitivity): 9 ng/L (ref <18)

## 2023-05-27 MED ORDER — IOHEXOL 350 MG/ML SOLN
75.0000 mL | Freq: Once | INTRAVENOUS | Status: AC | PRN
  Administered 2023-05-27: 75 mL via INTRAVENOUS

## 2023-05-27 NOTE — ED Provider Notes (Signed)
 [facility] Provider Note    Event Date/Time   First MD Initiated Contact with Patient 05/27/23 [phone]     (approximate)   History   Chest Pain   HPI  Madison Burns is a 73 y.o. female   Past medical history of myasthenia gravis, recent femur fracture status post repair at [facility] who presents to the emergency department from her rehab facility with chest pain and shortness of breath starting last night.  It is left-sided chest pain associated with shortness of breath.  It radiates to her left shoulder.  She denies any respiratory infectious symptoms.  She has no other acute medical complaints.   External Medical Documents Reviewed: [facility] notes from earlier this month with femur fracture, discharged to rehab on ac for VTE prophylaxis      Physical Exam   Triage Vital Signs: ED Triage Vitals  Encounter Vitals Group     BP 05/27/23 0610 (!) 110/51     Systolic BP Percentile --      Diastolic BP Percentile --      Pulse Rate 05/27/23 0608 84     Resp 05/27/23 0608 (!) 21     Temp 05/27/23 0608 98.1 F (36.7 C)     Temp Source 05/27/23 0608 Oral     SpO2 05/27/23 0606 96 %     Weight 05/27/23 0609 273 lb 13 oz (124.2 kg)     Height 05/27/23 0609 5\' 7"  (1.702 m)     Head Circumference --      [facility] Flow --      Pain Score 05/27/23 0609 10     Pain Loc --      Pain Education --      Exclude from Growth Chart --     Most recent vital signs: Vitals:   05/27/23 0610 05/27/23 0700  BP: (!) 110/51 (!) 129/56  Pulse:  82  Resp:  16  Temp:    SpO2:  94%    General: Awake, no distress.  CV:  Good peripheral perfusion.  Resp:  Normal effort.  Abd:  No distention.  Other:  Surgical scars in the left lower extremity do not appear grossly infected.  She has a soft benign abdominal exam.  She has clear lungs.  Hemodynamics appropriate and reassuring.   ED Results / Procedures / Treatments   Labs (all labs ordered are listed,  but only abnormal results are displayed) Labs Reviewed  COMPREHENSIVE METABOLIC PANEL WITH GFR - Abnormal; Notable for the following components:      Result Value   Glucose, Bld 111 (*)    BUN 26 (*)    Calcium 8.2 (*)    Total Protein 5.6 (*)    Albumin 2.4 (*)    All other components within normal limits  CBC WITH DIFFERENTIAL/PLATELET - Abnormal; Notable for the following components:   RBC 3.13 (*)    Hemoglobin 8.8 (*)    HCT 27.9 (*)    RDW 20.0 (*)    Abs Immature Granulocytes 0.17 (*)    All other components within normal limits  LIPASE, BLOOD  TROPONIN I (HIGH SENSITIVITY)     I ordered and reviewed the above labs they are notable for hemoglobin is 8.8 from a prior of 11.6, with interim surgery for femur fracture.  Electrolytes unremarkable.  EKG  ED ECG REPORT I, Pilar Jarvis, the attending physician, personally viewed and interpreted this ECG.   Date: 05/27/2023  EKG  Time: 0605  Rate: 86  Rhythm: sinus  Axis: nl  Intervals:none  ST&T Change: no stemi    RADIOLOGY I independently reviewed and interpreted chest x-ray and I see no obvious focality or pneumothorax I also reviewed radiologist's formal read.   PROCEDURES:  Critical Care performed: No  Procedures   MEDICATIONS ORDERED IN ED: Medications  iohexol (OMNIPAQUE) 350 MG/ML injection 75 mL (has no administration in time range)    IMPRESSION / MDM / ASSESSMENT AND PLAN / ED COURSE  I reviewed the triage vital signs and the nursing notes.                                Patient's presentation is most consistent with acute presentation with potential threat to life or bodily function.  Differential diagnosis includes, but is not limited to, respiratory infection, ACS, PE   The patient is on the cardiac monitor to evaluate for evidence of arrhythmia and/or significant heart rate changes.  MDM:    She is postoperative with chest pain and shortness of breath concerning for PE or fat embolism,  will get a CT angiogram of the chest for further evaluation.  Could be respiratory infection as well, getting labs and imaging as above.  Check for ACS with EKG which fortunately looks nonischemic, follow-up with serial troponin.  Signed out to oncoming provider in stable condition.   Clinical Course as of 05/27/23 0732  Wed May 27, 2023  0721 S/o from Dr. Modesto Burns: 88F with recent femur fracture repair presents for chest pain, shortness of breath.  CTA pending, initial Trope unremarkable  TO DO - f/u CTA, dispo [MM]    Clinical Course User Index [MM] Madison Blight, MD     FINAL CLINICAL IMPRESSION(S) / ED DIAGNOSES   Final diagnoses:  None     Rx / DC Orders   ED Discharge Orders     None        Note:  This document was prepared using Dragon voice recognition software and may include unintentional dictation errors.    Pilar Jarvis, MD 05/27/23 [phone]

## 2023-05-27 NOTE — Discharge Instructions (Addendum)
 Your evaluation in the emergency department was overall reassuring.  I am unsure as to the cause of your brief chest discomfort earlier, but we saw no concerning findings today including no blood clots and no evidence of heart attack.  Please do follow-up with your primary care doctor for reevaluation, and return to the emergency department with any new or worsening symptoms.

## 2023-05-27 NOTE — ED Provider Notes (Signed)
 Signout from Dr. Modesto Charon.  See updated ED course below.  Workup unremarkable, discharged home.  Fortunately no evidence of underlying PE.  EKG nonischemic, serial troponins negative.  Do not suspect cardiac etiology presentation at this time. Physical Exam  BP (!) 129/56   Pulse 82   Temp 98.1 F (36.7 C) (Oral)   Resp 16   Ht 5\' 7"  (1.702 m)   Wt 124.2 kg   SpO2 94%   BMI 42.88 kg/m   Physical Exam  Procedures  Procedures  ED Course / MDM   Clinical Course as of 05/27/23 1554  Wed May 27, 2023  0721 S/o from Dr. Modesto Charon: 44F with recent femur fracture repair presents for chest pain, shortness of breath.  CTA pending, initial Trope unremarkable  TO DO - f/u CTA, dispo [MM]  [phone] Patient reevaluated, resting comfortably in bed, satting 97% on room air with normal work of breathing  Second troponin and CTA results pending [MM]  0855 Repeat troponin stable, unchanged [MM]  1103 CTA: IMPRESSION: Stable postsurgical and post radiation changes.  No acute cardiopulmonary disease.  No evidence of pulmonary embolus.  No PE appreciated on my interpretation as well. [MM]  1340 Patient reevaluated, remains asymptomatic here in emergency department.  Very limited historian, was also not even able to tell me clearly that she had chest pain last night.  Did discuss unremarkable workup including delta troponins, EKG, CT PE.  Very well-appearing on my reeval.  Overall I highly doubt cardiac etiology given reassuring workup today and unclear symptomatology prior.  I personally reviewed EKG as well and agree it is nonischemic.  Primary concern was for pulmonary embolism which has been ruled out.  Believe patient stable for outpatient follow-up-open plan for PMD follow-up, ED return precautions in place.  Attempted to call patient's sister listed in the chart to provide update, no answer. [MM]    Clinical Course User Index [MM] [name], MD   Medical Decision Making Amount and/or  Complexity of Data Reviewed Labs: ordered. Radiology: ordered.  Risk Prescription drug management.         [name], MD 05/27/23 [phone]

## 2023-05-27 NOTE — ED Notes (Signed)
 [facility]  called  for  transport  to  [facility]

## 2023-05-27 NOTE — ED Notes (Signed)
 This RN and Madison Burns provided peri care and a complete bed change for this pt. Pt had one episode of stool. New gown provided and tolerated well. CB within reach. NAD.

## 2023-05-27 NOTE — ED Notes (Signed)
 20 G IV inserted by EMS in RAC infiltrated. IV removed.

## 2023-05-27 NOTE — ED Notes (Signed)
 Pt given TV remote per request by this RN

## 2023-05-27 NOTE — ED Notes (Addendum)
 Pt to CT

## 2023-05-27 NOTE — ED Notes (Signed)
 Pt back from CT

## 2023-05-27 NOTE — ED Notes (Signed)
 This nurse called report to [facility] [phone] and gave report to on call nurse

## 2023-05-27 NOTE — ED Triage Notes (Signed)
 Pt arrives via EMS from [facility] for c/o left sided chest pain that radiates down her left arm x 1 hour. Received 325 mg ASA en route with EMS. 20 G IV present upon arrival in [facility]. Reports feeling some shortness of breath. Rates pain 10/10, sharp, and intermittent.

## 2023-05-31 NOTE — Assessment & Plan Note (Signed)
#  Invasive thymoma, S/p 2 cycle of  reduced carboplatin AUC 4, etoposide day 1 to 3 at 75 mg/m, every 3 weeks, and concurrent radiation Labs are reviewed and discussed with patient. 05/13/2022, CT chest with contrast Postsurgical changes related to prior thymoma resection. No evidence of recurrent or metastatic disease.Mild patchy/ground-glass opacities in the bilateral upper lobes,favoring radiation changes given the clinical history Continue CT surveillance, next due in 1 year.

## 2023-06-01 ENCOUNTER — Encounter: Payer: Self-pay | Admitting: Oncology

## 2023-06-01 ENCOUNTER — Inpatient Hospital Stay (HOSPITAL_BASED_OUTPATIENT_CLINIC_OR_DEPARTMENT_OTHER): Payer: Self-pay | Admitting: Oncology

## 2023-06-01 ENCOUNTER — Inpatient Hospital Stay: Payer: Self-pay | Attending: Oncology

## 2023-06-01 ENCOUNTER — Other Ambulatory Visit: Payer: Self-pay

## 2023-06-01 VITALS — BP 114/67 | HR 71 | Temp 97.1°F | Resp 18

## 2023-06-01 DIAGNOSIS — D4989 Neoplasm of unspecified behavior of other specified sites: Secondary | ICD-10-CM | POA: Diagnosis not present

## 2023-06-01 DIAGNOSIS — Z79899 Other long term (current) drug therapy: Secondary | ICD-10-CM | POA: Diagnosis not present

## 2023-06-01 DIAGNOSIS — R296 Repeated falls: Secondary | ICD-10-CM | POA: Insufficient documentation

## 2023-06-01 DIAGNOSIS — M7989 Other specified soft tissue disorders: Secondary | ICD-10-CM | POA: Diagnosis not present

## 2023-06-01 DIAGNOSIS — C642 Malignant neoplasm of left kidney, except renal pelvis: Secondary | ICD-10-CM

## 2023-06-01 DIAGNOSIS — C37 Malignant neoplasm of thymus: Secondary | ICD-10-CM | POA: Diagnosis present

## 2023-06-01 DIAGNOSIS — Z923 Personal history of irradiation: Secondary | ICD-10-CM | POA: Diagnosis not present

## 2023-06-01 DIAGNOSIS — Z85820 Personal history of malignant melanoma of skin: Secondary | ICD-10-CM | POA: Diagnosis not present

## 2023-06-01 DIAGNOSIS — D649 Anemia, unspecified: Secondary | ICD-10-CM | POA: Diagnosis not present

## 2023-06-01 DIAGNOSIS — S72432D Displaced fracture of medial condyle of left femur, subsequent encounter for closed fracture with routine healing: Secondary | ICD-10-CM | POA: Insufficient documentation

## 2023-06-01 DIAGNOSIS — Z9049 Acquired absence of other specified parts of digestive tract: Secondary | ICD-10-CM | POA: Diagnosis not present

## 2023-06-01 DIAGNOSIS — G7000 Myasthenia gravis without (acute) exacerbation: Secondary | ICD-10-CM | POA: Diagnosis not present

## 2023-06-01 DIAGNOSIS — F79 Unspecified intellectual disabilities: Secondary | ICD-10-CM | POA: Insufficient documentation

## 2023-06-01 LAB — CBC WITH DIFFERENTIAL (CANCER CENTER ONLY)
Abs Immature Granulocytes: 0.46 10*3/uL — ABNORMAL HIGH (ref 0.00–0.07)
Basophils Absolute: 0.1 10*3/uL (ref 0.0–0.1)
Basophils Relative: 1 %
Eosinophils Absolute: 0.2 10*3/uL (ref 0.0–0.5)
Eosinophils Relative: 2 %
HCT: 31.5 % — ABNORMAL LOW (ref 36.0–46.0)
Hemoglobin: 9.4 g/dL — ABNORMAL LOW (ref 12.0–15.0)
Immature Granulocytes: 4 %
Lymphocytes Relative: 6 %
Lymphs Abs: 0.7 10*3/uL (ref 0.7–4.0)
MCH: 27.7 pg (ref 26.0–34.0)
MCHC: 29.8 g/dL — ABNORMAL LOW (ref 30.0–36.0)
MCV: 92.9 fL (ref 80.0–100.0)
Monocytes Absolute: 0.6 10*3/uL (ref 0.1–1.0)
Monocytes Relative: 6 %
Neutro Abs: 8.8 10*3/uL — ABNORMAL HIGH (ref 1.7–7.7)
Neutrophils Relative %: 81 %
Platelet Count: 283 10*3/uL (ref 150–400)
RBC: 3.39 MIL/uL — ABNORMAL LOW (ref 3.87–5.11)
RDW: 21.2 % — ABNORMAL HIGH (ref 11.5–15.5)
WBC Count: 10.7 10*3/uL — ABNORMAL HIGH (ref 4.0–10.5)
nRBC: 0 % (ref 0.0–0.2)

## 2023-06-01 LAB — CMP (CANCER CENTER ONLY)
ALT: 20 U/L (ref 0–44)
AST: 28 U/L (ref 15–41)
Albumin: 2.7 g/dL — ABNORMAL LOW (ref 3.5–5.0)
Alkaline Phosphatase: 61 U/L (ref 38–126)
Anion gap: 11 (ref 5–15)
BUN: 26 mg/dL — ABNORMAL HIGH (ref 8–23)
CO2: 24 mmol/L (ref 22–32)
Calcium: 7.7 mg/dL — ABNORMAL LOW (ref 8.9–10.3)
Chloride: 96 mmol/L — ABNORMAL LOW (ref 98–111)
Creatinine: 0.9 mg/dL (ref 0.44–1.00)
GFR, Estimated: >60 mL/min (ref >60)
Glucose, Bld: 142 mg/dL — ABNORMAL HIGH (ref 70–99)
Potassium: 3.5 mmol/L (ref 3.5–5.1)
Sodium: 131 mmol/L — ABNORMAL LOW (ref 135–145)
Total Bilirubin: 1.1 mg/dL (ref 0.0–1.2)
Total Protein: 5.5 g/dL — ABNORMAL LOW (ref 6.5–8.1)

## 2023-06-01 LAB — IRON AND TIBC
Iron: UNDETERMINED ug/dL (ref 28–170)
TIBC: 402 ug/dL (ref 250–450)

## 2023-06-01 LAB — RETIC PANEL
Immature Retic Fract: 32.1 % — ABNORMAL HIGH (ref 2.3–15.9)
RBC.: 3.38 MIL/uL — ABNORMAL LOW (ref 3.87–5.11)
Retic Count, Absolute: 187.6 10*3/uL — ABNORMAL HIGH (ref 19.0–186.0)
Retic Ct Pct: 5.6 % — ABNORMAL HIGH (ref 0.4–3.1)
Reticulocyte Hemoglobin: 29.1 pg (ref >27.9)

## 2023-06-01 LAB — FOLATE: Folate: >40 ng/mL (ref >5.9)

## 2023-06-01 LAB — FERRITIN: Ferritin: 74 ng/mL (ref 11–307)

## 2023-06-01 NOTE — Assessment & Plan Note (Signed)
continue follow-up with neurology.

## 2023-06-01 NOTE — Progress Notes (Signed)
 Orthopaedic Trauma Visit  History of Present Illness: Ms. Madison Burns is a 73 y.o. female with a PMHx of myasthenia gravis, GERD, IBS, OSA on CPAP, who presents for 2 week post-operative evaluation following ORIF of a left distal femur fracture by Dr. Martell on 05/22/23.  Date of injury is 05/22/23 after a ground level fall. Postop plan is NWB LLE x8 weeks, then TDWB x4 weeks.  She primarily uses a wheelchair for ambulation and a walker for short distances. She lives at an assisted living facility.  Since discharge, Madison Burns reports having some pain, though she was not able to elaborate more.  Past Medical, Surgical and Social History:  Past Medical History:  Diagnosis Date  . Agitation 08/03/2013  . GERD (gastroesophageal reflux disease)   . Hyperlipidemia   . Hypertension   . IBS (irritable bowel syndrome)   . Mild mental retardation   . Myasthenia gravis (CMS/HHS-HCC) 08/03/2013  . Obesity 08/03/2013  . Renal cell cancer (CMS/HHS-HCC)   . Seizures (CMS/HHS-HCC) 08/03/2013  . Thymoma      Past Surgical History:  Procedure Laterality Date  . EGD  06/30/2017   GERD/Gastritis/No Repeat/TKT  . COLONOSCOPY  06/30/2017   Diverticulosis/The examination was otherwise normal/Repeat 4yrs/TKT  . OPEN REDUCTION FEMORAL FRACTURE SUPRACONDYLAR/TRANSCONDYLAR Left 05/22/2023   Procedure: OPEN TREATMENT OF FEMORAL SUPRACONDYLAR OR TRANSCONDYLAR FRACTURE WITHINTERCONDYLAR EXTENSION, INCLUDES INTERNAL FIXATION, WHEN PERFORMED;  Surgeon: Martell Gist, MD;  Location: [facility];  Service: Orthopedics;  Laterality: Left;  . APPENDECTOMY    . CATARACT EXTRACTION    . HYSTERECTOMY VAGINAL      Family History  Problem Relation Age of Onset  . Aneurysm Father   . Heart disease Father      Current Outpatient Medications  Medication Sig Dispense Refill  . acetaminophen  (TYLENOL ) 325 MG tablet Take 3 tablets (975 mg total) by mouth 3 (three) times daily for 10 days    . acetaminophen -DM  (ROBITUSSIN COUGH-SORE THROAT) 325-10 mg/10 mL Liqd Take 20 mLs by mouth every 6 (six) hours as needed    . AMIOdarone  (PACERONE ) 200 MG tablet Take 1 tablet (200 mg total) by mouth once daily 30 tablet 11  . ascorbic acid , vitamin C , (VITAMIN C ) 500 MG tablet Take 1,000 mg by mouth at bedtime    . benzonatate  (TESSALON ) 100 MG capsule TAKE 1 CAPSULE BY MOUTH TWICE DAILY AS NEEDED COUGH    . biotin  10,000 mcg Cap Take 1 capsule by mouth once daily    . bisacodyL (DULCOLAX) 10 mg suppository Place 10 mg rectally once daily as needed for Constipation    . calcium  carbonate-vitamin D3 (OS-CAL 500+D) 500 mg-5 mcg (200 unit) tablet Take 1 tablet by mouth once daily.    . cetirizine (ZYRTEC) 5 MG tablet Take 5 mg by mouth once daily    . cyanocobalamin  (VITAMIN B12) 250 MCG tablet Take 250 mcg by mouth 2 (two) times daily    . desmopressin  (DDAVP  NASAL) 10 mcg/spray (0.1 mL) solution Place 1 spray into both nostrils 2 (two) times daily    . dilTIAZem  (CARDIZEM  CD) 180 MG CD capsule Take 1 capsule (180 mg total) by mouth once daily 30 capsule 11  . DULoxetine  (CYMBALTA ) 30 MG DR capsule Take 1 capsule by mouth once daily    . enoxaparin  (LOVENOX ) 40 mg/0.4 mL injection syringe Inject 0.4 mLs (40 mg total) subcutaneously once daily for 23 days in abdomen, rotating site with each injection.    . fluticasone  furoate-vilanteroL (  BREO ELLIPTA ) 100-25 mcg/dose DsDv inhaler Inhale 1 Puff into the lungs once daily    . folic acid  (FOLVITE ) 1 MG tablet Take 2 mg by mouth once daily    . FUROsemide  (LASIX ) 20 MG tablet Take 1 tablet (20 mg total) by mouth once daily    . garlic 1,000 mg Cap Take 1 capsule by mouth 3 (three) times daily    . ipratropium-albuteroL  (DUO-NEB) nebulizer solution Take 3 mLs by nebulization every 4 (four) hours as needed for Wheezing    . levothyroxine  (SYNTHROID ) 75 MCG tablet Take 1 tablet by mouth once daily    . lidocaine  (SALONPAS) 4 % patch Place 1 patch onto the skin daily for 30  days Apply patch to the most painful area for up to 12 hours in a 24 hours period.    . magnesium  hydroxide (MILK OF MAGNESIA) 400 mg/5 mL suspension Take 30 mLs by mouth once daily as needed for Constipation    . magnesium  oxide (MAG-OX) 400 mg (241.3 mg magnesium ) tablet Take 400 mg by mouth once daily    . methotrexate  (RHEUMATREX) 2.5 MG tablet Take 4 tablets (10 mg total) by mouth every 7 (seven) days    . nystatin (MYCOSTATIN) 100,000 unit/gram powder Apply 1 Application topically 2 (two) times daily    . pantoprazole  (PROTONIX ) 40 MG DR tablet Take 1 tablet (40 mg total) by mouth once daily 30 tablet 11  . pyridostigmine  (MESTINON ) 60 mg tablet Take 1 tablet (60 mg total) by mouth every 8 (eight) hours. 90 tablet 5  . risperiDONE  (RISPERDAL ) 1 MG tablet Take 1 mg by mouth at bedtime    . sodium phosphates (FLEET) enema Place 1 enema rectally once as needed for Constipation    . tolterodine  (DETROL  LA) 4 MG LA capsule Take 4 mg by mouth once daily.    . vitamin B complex vit C no.3 (B COMPLEX PLUS VITAMIN C  ORAL) Take 1 tablet by mouth at bedtime     No current facility-administered medications for this visit.    Allergies  Allergen Reactions  . Penicillamine (Bulk) Other (See Comments)    Myasthenia Gravis Diagnosis  . Ciprofloxacin Unknown    Not allergic this med per sister (PA Sister denies  . Levofloxacin Unknown    Not allergic to this med per patient's sister (PA). Sister denies    Social History   Socioeconomic History  . Marital status: Single  Tobacco Use  . Smoking status: Never  . Smokeless tobacco: Never  Substance and Sexual Activity  . Alcohol use: No    Alcohol/week: 0.0 standard drinks of alcohol  . Drug use: No   Social Drivers of Corporate investment banker Strain: Low Risk  (05/30/2022)   Received from [facility]   Overall Financial Resource Strain (CARDIA)   . Difficulty of Paying Living Expenses: Not hard at all  Food Insecurity: No Food  Insecurity (12/07/2022)   Received from [facility]   Hunger Vital Sign   . Worried About Programme researcher, broadcasting/film/video in the Last Year: Never true   . Ran Out of Food in the Last Year: Never true  Transportation Needs: No Transportation Needs (05/24/2023)   PRAPARE - Transportation   . Lack of Transportation (Medical): No   . Lack of Transportation (Non-Medical): No  Physical Activity: Insufficiently Active (05/30/2022)   Received from [facility]   Exercise Vital Sign   . Days of Exercise per Week: 5 days   .  Minutes of Exercise per Session: 20 min  Stress: No Stress Concern Present (05/30/2022)   Received from [facility] - Occupational Stress Questionnaire   . Feeling of Stress : Not at all  Social Connections: Unknown (05/30/2022)   Received from [facility]   Social Connection and Isolation Panel [NHANES]   . Frequency of Social Gatherings with Friends and Family: More than three times a week      Physical Exam: There were no vitals taken for this visit. General/Constitutional: No apparent distress: well-nourished and well developed. Knee Musculoskeletal Exam Gait   Assistive device: wheelchair  Inspection   Left     Erythema: none       Effusion: none       Edema: moderate       Ecchymosis: none       Deformity: none       Incision: well-healed     Incisional drainage: none  Palpation   Left     Left knee palpation is unremarkable.    Range of Motion   Left     Active extension: 10     Active flexion: 70  Neurovascular   Left     Sensation: tibial and superficial peroneal       Dorsalis pedis: 1+     Capillary refill: brisk  General     Constitutional: well-developed and well-nourished   Labored breathing: no   Psychiatric: normal mood and affect and no acute distress   Neurological: alert   Skin: intact   Imaging No images were taken in clinic today  Assessment:    ICD-10-CM  1. Closed bicondylar fracture of  left femur with routine healing  S72.422D   S72.432D    Plan:  Findings are discussed with the patient.  Patient/family verbalized understanding.  - Incisions are well healed. - Sutures removed today, steristrips will fall off on their own or may be removed after one week.  You may leave the incisions open to air without a dressing. - You may shower, using care not to fall, but do not submerge the incisions in water - Script provided for physical therapy to work on knee range of motion, mobility, balance, and fall prevention. - Maintain no weightbearing on the left leg for a total of 8 weeks from surgery. Then you will progress to touch down weightbearing for 4 more weeks. - Return to clinic in 4 weeks with repeat images of the left femur (2V).   Attestation Statement:   I personally performed the service, non-incident to. (WP)   ALEXANDRA RAE SCHROEDER-HOAR, NP

## 2023-06-01 NOTE — Assessment & Plan Note (Signed)
 Further work up showed normal iron tibc ferritin, folate.  Possibly due to chronic disease.  Observation.

## 2023-06-01 NOTE — Assessment & Plan Note (Signed)
#  Left  RCC, patient is status post cryoablation of the lesion.

## 2023-06-01 NOTE — Progress Notes (Signed)
 Hematology/Oncology Progress note [phone] [phone]   CHIEF COMPLAINTS/REASON FOR VISIT:  Follow-up for thymoma, history of RCC   ASSESSMENT & PLAN:   Thymoma #Invasive thymoma, S/p 2 cycle of  reduced carboplatin AUC 4, etoposide day 1 to 3 at 75 mg/m, every 3 weeks, and concurrent radiation Labs are reviewed and discussed with patient. No recurrence on CT chest  Continue CT surveillance, next due in 1 year.  Renal cell cancer, left (HCC) #Left  RCC, patient is status post cryoablation of the lesion.     MG (myasthenia gravis) (HCC) continue follow-up with neurology.    Orders Placed This Encounter  Procedures   CT Chest W Contrast    Standing Status:   Future    Expected Date:   05/31/2024    Expiration Date:   05/31/2024    If indicated for the ordered procedure, I authorize the administration of contrast media per Radiology protocol:   Yes    Does the patient have a contrast media/X-ray dye allergy?:   No    Preferred imaging location?:   [facility]   CMP (Cancer Center only)    Standing Status:   Future    Expected Date:   05/31/2024    Expiration Date:   05/31/2024   CBC with Differential (Cancer Center Only)    Standing Status:   Future    Expected Date:   05/31/2024    Expiration Date:   05/31/2024   Ferritin    Standing Status:   Future    Number of Occurrences:   1    Expected Date:   06/01/2023    Expiration Date:   05/31/2024   Iron and TIBC    Standing Status:   Future    Number of Occurrences:   1    Expected Date:   06/01/2023    Expiration Date:   05/31/2024   Retic Panel    Standing Status:   Future    Number of Occurrences:   1    Expected Date:   06/01/2023    Expiration Date:   05/31/2024   Folate    Standing Status:   Future    Number of Occurrences:   1    Expected Date:   06/01/2023    Expiration Date:   05/31/2024   Follow-up in 1 year. All questions were answered. The patient knows to call the clinic with  any problems, questions or concerns.  Madison Forbes, MD, PhD [facility] Hematology Oncology 06/01/2023   HISTORY OF PRESENTING ILLNESS:   Madison Burns is a  73 y.o.  female with PMH listed below was seen in consultation at the request of  Dellar Fenton, MD  for evaluation of thymoma.  Patient was accompanied by her sister today. Sister is POA. Patient lives with sister  Thymoma  02/07/2019 CT abdomen pelvis with contrast.   incidental 3 cm renal mass on the left.  Subsequent MRCP 02/21/2019 performed for the purpose of right upper quadrant pain as well as renal mass in question.  MRI 02/20/2018 showed 3.2 x 2.6 left lower pole endophytic renal mass, consistent with possible renal cell carcinoma.  No signs of metastatic disease in abdomen.  Adrenal lesion more suggestive adenomas.  There was an incidental finding of lobular area along the anterior mediastinum, questionably e mediastinal mass. CT scan 02/07/2019 showed lobulated internally calcified mass of the anterior mediastinum which effaces the central portion of the left brachiocephalic vein and a very  closely abuts the tubular ascending aorta, 5.8 x 4.9 x 4.1 cm.  Adjacent pleural nodularity about the right upper lobe. 3 mm pulmonary nodule of the right upper lobe, hepatic steatosis.   # Patient underwent CT-guided needle biopsy of the anterior mediastinal mass, as well as biopsy of the kidney mass Anterior mediastinal mass biopsy pathology showed thymoma, predominantly B2 patent with subsets of a B3 pattern. Left kidney mass showed renal cell carcinoma,tumor cannot be reliably subtyped based on this limited sample and the IHC profile  She has a history of melanoma on her neck in 2017.   #Cancer treatment 04/27/2019-05/18/2019 patient status post 2 cycles of dose reduced carboplatin and etoposide treatment concurrently with radiation. April 2021, patient  finished radiation treatments..  Surveillance  10/25/2020 CT chest w contrast showed  stable disease. No signs of recurrence . 04/29/2021, CT chest with contrast showed stable examination.  No new or progressive findings.  Stable appearance of the anterior mediastinal soft tissue with associated dystrophic calcification.  Compatible with treated thymoma.   Left RCC 08/17/2019-08/18/2019 Left  RCC, patient was admitted.  Patient underwent cystoscopy with Dr. Kathrynn Running with retrograde ureteral stent placement for renal protection during cryoablation.  The patient was taken to IR for an image guided left renal mass cryoablation by Dr. Archer Asa.  Procedure was complicated by perinephric bleeding following the procedure.  She was monitored closely and remained hemodynamically stable.  Left RCC s/p ablation 10/05/2020   Surveillance  05/02/2021, MRI with and without contrast showed stable post ablation appearance of the left kidney.  No suspicious nodularity/enhancement identified.  No Lymphadenopathy  #Patient has a chronic history of myasthenia gravis, initially diagnosed in 2011.  Patient had been treated with prednisone for prolonged period of time.  Recently she has been off prednisone and is currently taking methotrexate and pyridostigmine for symptoms.  Per sister, patient does not complain much of weakness.  At her baseline.  She is able to feed and dress herself.  She needs some assistance from her sister for bathing.  She likes to watch TV.   05/13/2022, CT chest with contrast Postsurgical changes related to prior thymoma resection. No evidence of recurrent or metastatic disease.Mild patchy/ground-glass opacities in the bilateral upper lobes,favoring radiation changes given the clinical history  INTERVAL HISTORY Madison Burns is a 73 y.o. female who has above history reviewed by me today presents for follow up visit for management of malignant thymoma, left renal cell carcinoma She has no new complaints today. Accompanied by facility staff. She lives in a facility   05/25/2023 CT  chest w contrast  Postsurgical changes related to prior thymoma resection. No evidence of recurrent or metastatic disease   Recent ER visit for chest pain. CTA negative Troponin unremarkable.   Review of Systems  Unable to perform ROS: Other (Intellectual Disability)  Gastrointestinal:  Negative for diarrhea and nausea.  Genitourinary:  Negative for dysuria and frequency.   Neurological:        Falls.    MEDICAL HISTORY:  Past Medical History:  Diagnosis Date   Cancer of kidney (HCC) 2021   Complication of anesthesia    Myasthenia gravis   GERD (gastroesophageal reflux disease)    History of seizure disorder    Hypercholesterolemia    Hypertension    no longer has it. wt loss   Hypokalemia 04/10/2022   Hyponatremia 04/10/2022   Hypothyroidism    Irritable bowel syndrome    Lactic acidosis 04/10/2022   Melanoma (HCC) 2017  Melanoma on neck   Myasthenia gravis (HCC) 2011   OSA on CPAP    Thymoma, malignant (HCC)     SURGICAL HISTORY: Past Surgical History:  Procedure Laterality Date   ABDOMINAL HYSTERECTOMY  1990   APPENDECTOMY  1999   CHOLECYSTECTOMY  2013   COLONOSCOPY WITH PROPOFOL N/A 06/30/2017   Procedure: COLONOSCOPY WITH PROPOFOL;  Surgeon: Toledo, Alphonsus Jeans, MD;  Location: [facility];  Service: Gastroenterology;  Laterality: N/A;   CYSTOSCOPY W/ URETERAL STENT PLACEMENT Left 08/17/2019   Procedure: CYSTOSCOPY WITH RETROGRADE PYELOGRAM/URETERAL STENT PLACEMENT;  Surgeon: Osborn Blaze, MD;  Location: [facility];  Service: Urology;  Laterality: Left;  30 MINS   ESOPHAGOGASTRODUODENOSCOPY (EGD) WITH PROPOFOL N/A 06/30/2017   Procedure: ESOPHAGOGASTRODUODENOSCOPY (EGD) WITH PROPOFOL;  Surgeon: Toledo, Alphonsus Jeans, MD;  Location: [facility];  Service: Gastroenterology;  Laterality: N/A;   IR RADIOLOGIST EVAL & MGMT  06/28/2019   IR RADIOLOGIST EVAL & MGMT  09/14/2019   IR RADIOLOGIST EVAL & MGMT  03/28/2020   IR RADIOLOGIST EVAL & MGMT  10/10/2020   IR RADIOLOGIST  EVAL & MGMT  11/22/2021   RADIOFREQUENCY ABLATION Left 08/17/2019   Procedure: LEFT RENAL CRYO ABLATION;  Surgeon: Fernando Hoyer, MD;  Location: [facility];  Service: Anesthesiology;  Laterality: Left;   TONSILLECTOMY  1959    SOCIAL HISTORY: Social History   Socioeconomic History   Marital status: Single    Spouse name: Not on file   Number of children: 0   Years of education: Special Ed   Highest education level: Not on file  Occupational History   Occupation: Unemployed  Tobacco Use   Smoking status: Never   Smokeless tobacco: Never  Vaping Use   Vaping status: Never Used  Substance and Sexual Activity   Alcohol use: No    Alcohol/week: 0.0 standard drinks of alcohol   Drug use: No   Sexual activity: Not Currently  Other Topics Concern   Not on file  Social History Narrative   06/23/17 lives with sister, Haskell Linker   Regular exercise-no   Caffeine Use-yes   Social Drivers of Health   Financial Resource Strain: Low Risk  (05/30/2022)   Overall Financial Resource Strain (CARDIA)    Difficulty of Paying Living Expenses: Not hard at all  Food Insecurity: No Food Insecurity (12/07/2022)   Hunger Vital Sign    Worried About Running Out of Food in the Last Year: Never true    Ran Out of Food in the Last Year: Never true  Transportation Needs: No Transportation Needs (05/24/2023)   [facility]   PRAPARE - Transportation    In the past 12 months, has lack of transportation kept you from medical appointments or from getting medications?: No    Lack of Transportation (Non-Medical): No  Physical Activity: Insufficiently Active (05/30/2022)   Exercise Vital Sign    Days of Exercise per Week: 5 days    Minutes of Exercise per Session: 20 min  Stress: No Stress Concern Present (05/30/2022)   [employer] of Occupational Health - Occupational Stress Questionnaire    Feeling of Stress : Not at all  Social Connections: Unknown (05/30/2022)   Social  Connection and Isolation Panel [NHANES]    Frequency of Communication with Friends and Family: Not on file    Frequency of Social Gatherings with Friends and Family: More than three times a week    Attends Religious Services: Not on file    Active Member of Clubs or  Organizations: Not on file    Attends Club or Organization Meetings: Not on file    Marital Status: Not on file  Intimate Partner Violence: Not At Risk (12/07/2022)   Humiliation, Afraid, Rape, and Kick questionnaire    Fear of Current or Ex-Partner: No    Emotionally Abused: No    Physically Abused: No    Sexually Abused: No    FAMILY HISTORY: Family History  Problem Relation Age of Onset   Colon cancer Maternal Grandfather        stomach/colon   Heart disease Father        myocardial infarction   Hyperlipidemia Father    Hyperlipidemia Sister    Diabetes Sister    Bone cancer Other        cousin   Alzheimer's disease Mother        maternal aunts   Skin cancer Mother    Myasthenia gravis Brother        ocular   Skin cancer Brother    Stroke Paternal Grandfather    Breast cancer Neg Hx     ALLERGIES:  is allergic to penicillamine.  MEDICATIONS:  Current Outpatient Medications  Medication Sig Dispense Refill   acetaminophen (TYLENOL) 325 MG tablet Take 2 tablets (650 mg total) by mouth every 6 (six) hours as needed for mild pain (or Fever >/= 101).     Acetaminophen-DM (ROBITUSSIN SEVERE CGH/SR THRT) 650-20 MG/20ML LIQD Take 20 mLs by mouth every 6 (six) hours as needed (cough).     amiodarone (PACERONE) 200 MG tablet Take 1 tablet (200 mg total) by mouth daily. 14 tablet 0   ascorbic acid (VITAMIN C) 500 MG tablet Take 1,000 mg by mouth at bedtime.     B Complex-C (B-COMPLEX WITH VITAMIN C) tablet Take 1 tablet by mouth at bedtime.     barrier cream (NON-SPECIFIED) CREA Apply 1 Application topically 3 (three) times daily. (Apply to buttocks each shift)     benzonatate (TESSALON) 100 MG capsule TAKE 1  CAPSULE BY MOUTH TWICE DAILY AS NEEDED FOR COUGH 40 capsule 0   Biotin 16109 MCG TABS Take 10,000 mcg by mouth daily at 2 PM.     bisacodyl (DULCOLAX) 10 MG suppository Place 10 mg rectally as needed for moderate constipation.     Calcium-Phosphorus-Vitamin D (CITRACAL +D3 PO) Take 1 tablet by mouth in the morning.     cetirizine (ZYRTEC) 5 MG tablet Take 5 mg by mouth daily.     desmopressin (DDAVP NASAL) 0.01 % solution Place 1 spray (10 mcg total) into the nose 2 (two) times daily. (Patient taking differently: Place 10 mcg into the nose See admin instructions. Inhale 1 spray in each nostril twice daily) 5 mL 12   diltiazem (CARDIZEM CD) 180 MG 24 hr capsule Take 180 mg by mouth daily.     DULoxetine (CYMBALTA) 30 MG capsule Take 1 capsule (30 mg total) by mouth daily. 30 capsule 2   fluticasone furoate-vilanterol (BREO ELLIPTA) 100-25 MCG/ACT AEPB Inhale 1 puff into the lungs daily.     folic acid (FOLVITE) 1 MG tablet TAKE 2 TABLETS BY MOUTH ONCE DAILY 60 tablet 11   Garlic 1000 MG CAPS Take 1,000 mg by mouth in the morning, at noon, and at bedtime.     ipratropium-albuterol (DUONEB) 0.5-2.5 (3) MG/3ML SOLN Take 3 mLs by nebulization every 4 (four) hours as needed (wheezing).     levothyroxine (SYNTHROID) 75 MCG tablet TAKE 1 TABLET BY MOUTH ONCE DAILY  ON AN EMPTY STOMACH. WAIT 30 MINUTES BEFORE TAKING OTHER MEDS. 90 tablet 1   magnesium hydroxide (MILK OF MAGNESIA) 400 MG/5ML suspension Take 30 mLs by mouth daily as needed for mild constipation. 355 mL 0   magnesium oxide (MAG-OX) 400 (240 Mg) MG tablet Take 400 mg by mouth daily.     methotrexate (RHEUMATREX) 2.5 MG tablet TAKE 4 TABLETS BY MOUTH ONCE A WEEK WITHFOLIC ACID (Patient taking differently: Take 10 mg by mouth every Monday.) 16 tablet 5   pantoprazole (PROTONIX) 40 MG tablet Take 40 mg by mouth daily.      pyridostigmine (MESTINON) 60 MG tablet TAKE 1 TABLET BY MOUTH 3 TIMES DAILY 270 tablet 1   risperiDONE (RISPERDAL) 1 MG tablet  Take 1 mg by mouth at bedtime.      Sodium Phosphates (FLEET SALINE ENEMA) 7-19 GM/197ML ENEM Place 1 enema rectally once as needed (severe constipation).     tolterodine (DETROL LA) 4 MG 24 hr capsule TAKE 1 CAPSULE BY MOUTH ONCE EVERY MORNING 90 capsule 1   vitamin B-12 (CYANOCOBALAMIN) 250 MCG tablet Take 250 mcg by mouth 2 (two) times daily.     No current facility-administered medications for this visit.     PHYSICAL EXAMINATION: ECOG PERFORMANCE STATUS: 1 - Symptomatic but completely ambulatory Vitals:   06/01/23 1117  BP: 114/67  Pulse: 71  Resp: 18  Temp: (!) 97.1 F (36.2 C)  SpO2: 100%   There were no vitals filed for this visit.   Physical Exam Constitutional:      General: She is not in acute distress.    Appearance: She is obese.  HENT:     Head: Normocephalic and atraumatic.  Eyes:     General: No scleral icterus. Cardiovascular:     Rate and Rhythm: Normal rate and regular rhythm.  Pulmonary:     Effort: Pulmonary effort is normal. No respiratory distress.     Breath sounds: No wheezing.  Abdominal:     General: Bowel sounds are normal. There is no distension.     Palpations: Abdomen is soft.  Musculoskeletal:        General: No deformity. Normal range of motion.     Cervical back: Normal range of motion and neck supple.  Skin:    General: Skin is warm and dry.     Findings: No erythema or rash.  Neurological:     Mental Status: She is alert. Mental status is at baseline.     Cranial Nerves: No cranial nerve deficit.     Comments: Oriented in person   Psychiatric:        Mood and Affect: Mood normal.     LABORATORY DATA:  I have reviewed the data as listed     Latest Ref Rng & Units 06/01/2023   10:57 AM 05/27/2023    6:22 AM 05/22/2023   12:50 AM  CBC  WBC 4.0 - 10.5 K/uL 10.7  8.5  14.2   Hemoglobin 12.0 - 15.0 g/dL 9.4  8.8  16.1   Hematocrit 36.0 - 46.0 % 31.5  27.9  35.1   Platelets 150 - 400 K/uL 283  220  214       Latest Ref Rng  & Units 06/01/2023   10:57 AM 05/27/2023    6:22 AM 05/22/2023   12:50 AM  CMP  Glucose 70 - 99 mg/dL 096  045  409   BUN 8 - 23 mg/dL 26  26  25  Creatinine 0.44 - 1.00 mg/dL 8.29  5.62  1.30   Sodium 135 - 145 mmol/L 131  136  138   Potassium 3.5 - 5.1 mmol/L 3.5  4.6  3.4   Chloride 98 - 111 mmol/L 96  100  100   CO2 22 - 32 mmol/L 24  30  28    Calcium 8.9 - 10.3 mg/dL 7.7  8.2  8.7   Total Protein 6.5 - 8.1 g/dL 5.5  5.6  5.8   Total Bilirubin 0.0 - 1.2 mg/dL 1.1  0.8  0.4   Alkaline Phos 38 - 126 U/L 61  45  61   AST 15 - 41 U/L 28  35  29   ALT 0 - 44 U/L 20  21  25       Iron/TIBC/Ferritin/ %Sat    Component Value Date/Time   IRON QUANTITY NOT SUFFICIENT, UNABLE TO PERFORM TEST 06/01/2023 1057   TIBC 402 06/01/2023 1057   FERRITIN 74 06/01/2023 1057      RADIOGRAPHIC STUDIES: I have personally reviewed the radiological images as listed and agreed with the findings in the report.. CT Angio Chest PE W/Cm &/Or Wo Cm Result Date: 05/27/2023 CLINICAL DATA:  Pulmonary embolism (PE) suspected, high prob. Left side chest pain. EXAM: CT ANGIOGRAPHY CHEST WITH CONTRAST TECHNIQUE: Multidetector CT imaging of the chest was performed using the standard protocol during bolus administration of intravenous contrast. Multiplanar CT image reconstructions and MIPs were obtained to evaluate the vascular anatomy. RADIATION DOSE REDUCTION: This exam was performed according to the departmental dose-optimization program which includes automated exposure control, adjustment of the mA and/or kV according to patient size and/or use of iterative reconstruction technique. CONTRAST:  75mL OMNIPAQUE IOHEXOL 350 MG/ML SOLN COMPARISON:  05/15/2023 FINDINGS: Cardiovascular: Heart is normal size. Aorta is normal caliber. No filling defects in the pulmonary arteries to suggest pulmonary emboli. Mediastinum/Nodes: No mediastinal, hilar, or axillary adenopathy. Trachea and esophagus are unremarkable. Thyroid  unremarkable. Postsurgical changes in the anterior mediastinum related to prior thymoma resection. Lungs/Pleura: Platelike opacity in the medial right upper lobe likely related to post radiation scarring. Mosaic continuation throughout the lungs likely related to air trapping. No effusions or confluent airspace opacities. Upper Abdomen: No acute findings Musculoskeletal: Chest wall soft tissues are unremarkable. No acute bony abnormality. Review of the MIP images confirms the above findings. IMPRESSION: Stable postsurgical and post radiation changes. No acute cardiopulmonary disease.  No evidence of pulmonary embolus. Electronically Signed   By: Charlett Nose M.D.   On: 05/27/2023 10:16   DG Chest Port 1 View Result Date: 05/27/2023 CLINICAL DATA:  cp/sob EXAM: PORTABLE CHEST 1 VIEW COMPARISON:  Chest XR, 12/05/2022.  CTA chest, 05/07/2023 FINDINGS: Cardiomediastinal silhouette is within normal limits. Lungs are hypoinflated, with RIGHT perihilar interstitial thickening. No focal consolidation or mass. No pleural effusion or pneumothorax. No acute displaced fracture. IMPRESSION: Mild hypoinflation without acute superimposed cardiopulmonary process. Electronically Signed   By: Roanna Banning M.D.   On: 05/27/2023 07:09   CT CHEST W CONTRAST Result Date: 05/25/2023 CLINICAL DATA:  Left renal cell cancer, history of thymoma EXAM: CT CHEST WITH CONTRAST TECHNIQUE: Multidetector CT imaging of the chest was performed during intravenous contrast administration. RADIATION DOSE REDUCTION: This exam was performed according to the departmental dose-optimization program which includes automated exposure control, adjustment of the mA and/or kV according to patient size and/or use of iterative reconstruction technique. CONTRAST:  75mL OMNIPAQUE IOHEXOL 300 MG/ML  SOLN COMPARISON:  05/12/2022 FINDINGS: Cardiovascular: The  heart is normal in size. No pericardial effusion. No evidence of thoracic aortic aneurysm. Mediastinum/Nodes:  Postsurgical changes in the anterior mediastinum related to prior thymoma resection. No suspicious mediastinal lymphadenopathy. Visualized thyroid is unremarkable. Lungs/Pleura: Scarring/radiation changes in the medial right upper lobe. No suspicious pulmonary nodules. No focal consolidation. No pleural effusion or pneumothorax. Upper Abdomen: Mild low-density thickening of the bilateral adrenal glands. Prior cholecystectomy. Musculoskeletal: Stable severe compression fracture deformity at L1, incompletely visualized. IMPRESSION: Postsurgical changes related to prior thymoma resection. No evidence of recurrent or metastatic disease. Electronically Signed   By: Zadie Herter M.D.   On: 05/25/2023 12:51   DG Hip Unilat W or Wo Pelvis 2-3 Views Left Result Date: 05/22/2023 CLINICAL DATA:  Fall with left leg pain from hip to knee. EXAM: DG HIP (WITH OR WITHOUT PELVIS) 2-3V LEFT; LEFT KNEE - COMPLETE 4+ VIEW COMPARISON:  None Available. FINDINGS: Acute oblique intra-articular displaced fracture of the distal left femoral metadiaphysis. Adjacent soft tissue swelling. No pelvic fracture. IMPRESSION: 1. Acute oblique intra-articular displaced fracture of the distal left femoral metadiaphysis. Electronically Signed   By: Rozell Cornet M.D.   On: 05/22/2023 00:48   DG Knee Complete 4 Views Left Result Date: 05/22/2023 CLINICAL DATA:  Fall with left leg pain from hip to knee. EXAM: DG HIP (WITH OR WITHOUT PELVIS) 2-3V LEFT; LEFT KNEE - COMPLETE 4+ VIEW COMPARISON:  None Available. FINDINGS: Acute oblique intra-articular displaced fracture of the distal left femoral metadiaphysis. Adjacent soft tissue swelling. No pelvic fracture. IMPRESSION: 1. Acute oblique intra-articular displaced fracture of the distal left femoral metadiaphysis. Electronically Signed   By: Rozell Cornet M.D.   On: 05/22/2023 00:48   CT Maxillofacial WO CM Result Date: 05/22/2023 CLINICAL DATA:  Facial trauma, blunt.  Fall. EXAM: CT  MAXILLOFACIAL WITHOUT CONTRAST TECHNIQUE: Multidetector CT imaging of the maxillofacial structures was performed. Multiplanar CT image reconstructions were also generated. RADIATION DOSE REDUCTION: This exam was performed according to the departmental dose-optimization program which includes automated exposure control, adjustment of the mA and/or kV according to patient size and/or use of iterative reconstruction technique. COMPARISON:  None Available. FINDINGS: Osseous: No fracture or mandibular dislocation. No destructive process. Orbits: No fracture or orbital emphysema.  Globes intact. Sinuses: No air-fluid levels. Soft tissues: Soft tissue swelling noted over the lateral right orbit and face. Limited intracranial: See head CT report IMPRESSION: No facial or orbital fracture. Electronically Signed   By: Janeece Mechanic M.D.   On: 05/22/2023 00:18   CT Cervical Spine Wo Contrast Result Date: 05/22/2023 CLINICAL DATA:  Neck trauma (Age >= 65y).  Fall. EXAM: CT CERVICAL SPINE WITHOUT CONTRAST TECHNIQUE: Multidetector CT imaging of the cervical spine was performed without intravenous contrast. Multiplanar CT image reconstructions were also generated. RADIATION DOSE REDUCTION: This exam was performed according to the departmental dose-optimization program which includes automated exposure control, adjustment of the mA and/or kV according to patient size and/or use of iterative reconstruction technique. COMPARISON:  06/02/2022 FINDINGS: Alignment: No subluxation. Skull base and vertebrae: No acute fracture. No primary bone lesion or focal pathologic process. Soft tissues and spinal canal: No prevertebral fluid or swelling. No visible canal hematoma. Disc levels: Diffuse degenerative disc disease with disc space narrowing and anterior spurring most pronounced at C4-5 and C5-6. Mild bilateral degenerative facet disease. Upper chest: Chronic right medial upper lobe platelike airspace opacity, likely scarring from  radiation. This is unchanged dating back to 05/12/2022. No acute findings. Other: None IMPRESSION: Degenerative disc and facet disease.  No acute bony abnormality. Electronically Signed   By: Janeece Mechanic M.D.   On: 05/22/2023 00:16   CT Head Wo Contrast Result Date: 05/22/2023 CLINICAL DATA:  Head trauma, minor (Age >= 65y), fall EXAM: CT HEAD WITHOUT CONTRAST TECHNIQUE: Contiguous axial images were obtained from the base of the skull through the vertex without intravenous contrast. RADIATION DOSE REDUCTION: This exam was performed according to the departmental dose-optimization program which includes automated exposure control, adjustment of the mA and/or kV according to patient size and/or use of iterative reconstruction technique. COMPARISON:  06/02/2022 FINDINGS: Brain: There is atrophy and chronic small vessel disease changes. No acute intracranial abnormality. Specifically, no hemorrhage, hydrocephalus, mass lesion, acute infarction, or significant intracranial injury. Vascular: No hyperdense vessel or unexpected calcification. Skull: No acute calvarial abnormality. Sinuses/Orbits: No acute findings Other: None IMPRESSION: Atrophy, chronic microvascular disease. No acute intracranial abnormality. Electronically Signed   By: Janeece Mechanic M.D.   On: 05/22/2023 00:13       ASSESSMENT & PLAN:  1. Thymoma   2. Renal cell cancer, left (HCC)    #Invasive thymoma, S/p 2 cycle of  reduced carboplatin AUC 4, etoposide day 1 to 3 at 75 mg/m, every 3 weeks, and concurrent radiation Labs are reviewed and discussed with patient. 04/29/2021, CT chest with contrast showed stable examination.  No new or progressive findings.  Stable appearance of the anterior mediastinal soft tissue with associated dystrophic calcification.  Compatible with treated thymoma. Continue CT surveillance, next due in 1 year.   #Myasthenia gravis, continue follow-up with neurology. #Left  RCC, patient is status post cryoablation  of the lesion.   05/02/2021, MRI with and without contrast showed stable post ablation appearance of the left kidney.  No suspicious nodularity/enhancement identified.  No Lymphadenopathy  #Frequent falls Home care referral for PT/OT evaluation. Check MRI brain with and without contrast  #Right lower extremity swelling, likely soft tissue injury due to recent fall episodes.  We will check right lower extremity ultrasound to rule out DVT.    Advised patient to follow-up in 12 months or earlier if indicated. All questions were answered. The patient knows to call the clinic with any problems questions or concerns.   Madison Forbes, MD, PhD Hematology Oncology 06/01/23

## 2023-06-05 NOTE — Progress Notes (Signed)
 ORTHO-TECH DOCUMENTATION Chief Complaint:  Chief Complaint  Patient presents with  . Left Thigh - Post Operative Visit   HISTORY OF PRESENT ILLNESS: This is a 73 y.o. female who is presenting for evaluation for left thigh. The surgery occurred on 05/22/2023. The following procedure was performed during today's visit on 06/05/2023.   IMAGES:  PROCEDURE: Sutures removal left thigh Sutures removal  Pre-Procedure Status: The incisions are clean and intact. yes      Patient is without pain. yes Skin is intact. yes  Post-Procedure Status: The patient presents for suture removal. The wound is well healed and without signs of infection. The 15 sutures are removed without difficulty. Steri strips were placed. Wound care and activity instructions were given. The patient tolerated the procedure well.  Patient is without pain. yes Skin is intact. yes PATIENT INSTRUCTION: The patient presents for suture removal. The wound is well healed and without signs of infection. The 15 sutures are removed without difficulty. Steri strips were placed. Wound care and activity instructions were given. Performed by: ZOE WALRATH

## 2023-06-05 NOTE — Progress Notes (Signed)
 Review of Systems  Respiratory: Negative.    Cardiovascular: Negative.   Musculoskeletal:  Positive for arthralgias, gait problem, joint swelling and myalgias.  Neurological:  Negative for numbness.

## 2023-06-25 ENCOUNTER — Other Ambulatory Visit: Payer: Self-pay

## 2023-06-25 DIAGNOSIS — C642 Malignant neoplasm of left kidney, except renal pelvis: Secondary | ICD-10-CM

## 2023-07-01 ENCOUNTER — Ambulatory Visit: Admitting: Urology

## 2023-07-23 ENCOUNTER — Ambulatory Visit: Admitting: Urology

## 2023-08-04 NOTE — Progress Notes (Signed)
 Orthopaedic Trauma Visit  History of Present Illness: Ms. Madison Burns is a 73 y.o. female with h/o HTN, HLD, RCC, mild developmental delay, myasthenia gravis, OSA on CPAP who presents for post-operative evaluation 11 weeks following ORIF of a left distal femur fracture 05/22/2023 by Dr. Mardy.  Date of injury is 05/22/23 after a ground level fall. Postop plan is NWB LLE x8 weeks, then TDWB x4 weeks.  She primarily uses a wheelchair for ambulation and a walker for short distances. She lives at an assisted living facility.  History of Present Illness Madison Burns is a 73 year old female who presents for follow-up evaluation of a left femur fracture.  She has persistent pain in the left femur, which has decreased since the initial injury. She remains non-weightbearing, standing only rarely and for short periods. She resides in a nursing home and has not received physical therapy for the past two weeks.  No numbness or tingling is present, and sensation is intact to light touch. She is not currently on any medication for osteoporosis.   Past Medical, Surgical and Social History:  Past Medical History:  Diagnosis Date  . Agitation 08/03/2013  . GERD (gastroesophageal reflux disease)   . Hyperlipidemia   . Hypertension   . IBS (irritable bowel syndrome)   . Mild mental retardation   . Myasthenia gravis (CMS/HHS-HCC) 08/03/2013  . Obesity 08/03/2013  . Renal cell cancer (CMS/HHS-HCC)   . Seizures (CMS/HHS-HCC) 08/03/2013  . Thymoma      Past Surgical History:  Procedure Laterality Date  . EGD  06/30/2017   GERD/Gastritis/No Repeat/TKT  . COLONOSCOPY  06/30/2017   Diverticulosis/The examination was otherwise normal/Repeat 41yrs/TKT  . OPEN REDUCTION FEMORAL FRACTURE SUPRACONDYLAR/TRANSCONDYLAR Left 05/22/2023   Procedure: OPEN TREATMENT OF FEMORAL SUPRACONDYLAR OR TRANSCONDYLAR FRACTURE WITHINTERCONDYLAR EXTENSION, INCLUDES INTERNAL FIXATION, WHEN PERFORMED;  Surgeon: Martell Gist, MD;  Location: [facility];  Service: Orthopedics;  Laterality: Left;  . APPENDECTOMY    . CATARACT EXTRACTION    . HYSTERECTOMY VAGINAL      Family History  Problem Relation Age of Onset  . Aneurysm Father   . Heart disease Father      Current Outpatient Medications  Medication Sig Dispense Refill  . acetaminophen -DM (ROBITUSSIN COUGH-SORE THROAT) 325-10 mg/10 mL Liqd Take 20 mLs by mouth every 6 (six) hours as needed    . AMIOdarone  (PACERONE ) 200 MG tablet Take 1 tablet (200 mg total) by mouth once daily 30 tablet 11  . ascorbic acid , vitamin C , (VITAMIN C ) 500 MG tablet Take 1,000 mg by mouth at bedtime    . benzonatate  (TESSALON ) 100 MG capsule TAKE 1 CAPSULE BY MOUTH TWICE DAILY AS NEEDED COUGH    . biotin  10,000 mcg Cap Take 1 capsule by mouth once daily    . bisacodyL (DULCOLAX) 10 mg suppository Place 10 mg rectally once daily as needed for Constipation    . calcium  carbonate-vitamin D3 (OS-CAL 500+D) 500 mg-5 mcg (200 unit) tablet Take 1 tablet by mouth once daily.    . cetirizine (ZYRTEC) 5 MG tablet Take 5 mg by mouth once daily    . cyanocobalamin  (VITAMIN B12) 250 MCG tablet Take 250 mcg by mouth 2 (two) times daily    . desmopressin  (DDAVP  NASAL) 10 mcg/spray (0.1 mL) solution Place 1 spray into both nostrils 2 (two) times daily    . dilTIAZem  (CARDIZEM  CD) 180 MG CD capsule Take 1 capsule (180 mg total) by mouth once daily 30 capsule 11  .  DULoxetine  (CYMBALTA ) 30 MG DR capsule Take 1 capsule by mouth once daily    . fluticasone  furoate-vilanteroL (BREO ELLIPTA ) 100-25 mcg/dose DsDv inhaler Inhale 1 Puff into the lungs once daily    . folic acid  (FOLVITE ) 1 MG tablet Take 2 mg by mouth once daily    . FUROsemide  (LASIX ) 20 MG tablet Take 1 tablet (20 mg total) by mouth once daily    . garlic 1,000 mg Cap Take 1 capsule by mouth 3 (three) times daily    . ipratropium-albuteroL  (DUO-NEB) nebulizer solution Take 3 mLs by nebulization every 4 (four) hours as  needed for Wheezing    . levothyroxine  (SYNTHROID ) 75 MCG tablet Take 1 tablet by mouth once daily    . magnesium  hydroxide (MILK OF MAGNESIA) 400 mg/5 mL suspension Take 30 mLs by mouth once daily as needed for Constipation    . magnesium  oxide (MAG-OX) 400 mg (241.3 mg magnesium ) tablet Take 400 mg by mouth once daily    . methotrexate  (RHEUMATREX) 2.5 MG tablet Take 4 tablets (10 mg total) by mouth every 7 (seven) days    . nystatin (MYCOSTATIN) 100,000 unit/gram powder Apply 1 Application topically 2 (two) times daily    . pantoprazole  (PROTONIX ) 40 MG DR tablet Take 1 tablet (40 mg total) by mouth once daily 30 tablet 11  . pyridostigmine  (MESTINON ) 60 mg tablet Take 1 tablet (60 mg total) by mouth every 8 (eight) hours. 90 tablet 5  . risperiDONE  (RISPERDAL ) 1 MG tablet Take 1 mg by mouth at bedtime    . sodium phosphates (FLEET) enema Place 1 enema rectally once as needed for Constipation    . tolterodine  (DETROL  LA) 4 MG LA capsule Take 4 mg by mouth once daily.    . vitamin B complex vit C no.3 (B COMPLEX PLUS VITAMIN C  ORAL) Take 1 tablet by mouth at bedtime     No current facility-administered medications for this visit.    Allergies  Allergen Reactions  . Penicillamine (Bulk) Other (See Comments)    Myasthenia Gravis Diagnosis  . Ciprofloxacin Unknown    Not allergic this med per sister (PA Sister denies  . Levofloxacin Unknown    Not allergic to this med per patient's sister (PA). Sister denies    Social History   Socioeconomic History  . Marital status: Single  Tobacco Use  . Smoking status: Never  . Smokeless tobacco: Never  Substance and Sexual Activity  . Alcohol use: No    Alcohol/week: 0.0 standard drinks of alcohol  . Drug use: No   Social Drivers of Corporate investment banker Strain: Low Risk  (05/30/2022)   Received from [facility]   Overall Financial Resource Strain (CARDIA)   . Difficulty of Paying Living Expenses: Not hard at all  Food  Insecurity: No Food Insecurity (12/07/2022)   Received from [facility]   Hunger Vital Sign   . Within the past 12 months, you worried that your food would run out before you got the money to buy more.: Never true   . Within the past 12 months, the food you bought just didn't last and you didn't have money to get more.: Never true  Transportation Needs: No Transportation Needs (05/24/2023)   PRAPARE - Transportation   . Lack of Transportation (Medical): No   . Lack of Transportation (Non-Medical): No  Physical Activity: Insufficiently Active (05/30/2022)   Received from [facility]   Exercise Vital Sign   . On average, how  many days per week do you engage in moderate to strenuous exercise (like a brisk walk)?: 5 days   . On average, how many minutes do you engage in exercise at this level?: 20 min  Stress: No Stress Concern Present (05/30/2022)   Received from [facility] - Occupational Stress Questionnaire   . Feeling of Stress : Not at all  Social Connections: Unknown (05/30/2022)   Received from [facility]   Social Connection and Isolation Panel   . How often do you get together with friends or relatives?: More than three times a week      Physical Exam: There were no vitals taken for this visit. General/Constitutional: No apparent distress: well-nourished and well developed. Knee Musculoskeletal Exam  General     Constitutional: well-developed and well-nourished   Labored breathing: no   Psychiatric: normal mood and affect and no acute distress   Neurological: alert  Physical Exam EXTREMITIES: Dorsalis pedis pulse 1+, 1+ pitting edema in lower extremity. Capillary refill brisk, foot warm and well perfused. Sensation grossly intact to light touch. MUSCULOSKELETAL: Knee motion 0 to 90 degrees.  Tender to palpation about the distal femur.  Imaging Results RADIOLOGY AP and lateral views of the left femur: Fractures in unchanged  alignment with implants intact and in place (08/05/2023)   I have personally reviewed and interpreted these images.  Assessment:    ICD-10-CM  1. Closed bicondylar fracture of left femur with routine healing  S72.422D   S72.432D     Plan:  Findings are discussed with the patient.  Patient/family verbalized understanding.  - Fracture shows evidence of gradual healing. - Script provided for physical therapy to work on knee range of motion, mobility, balance, and fall prevention. - In one week, may advance weightbearing as tolerated. - Return to clinic in 6-8 weeks with Nathanel Corolla, NP in the [facility] clinic to assess fracture healing and to discuss treatment of osteoporosis and how to reduce the risk of future fractures with repeat images of the left femur (2V).   Attestation Statement:   I personally performed the service, non-incident to. (WP)   JENNIFER KRISTINE DYE, PA  This note has been created using automated tools and reviewed for accuracy by JENNIFER KRISTINE DYE.

## 2023-08-05 NOTE — Progress Notes (Signed)
 Review of Systems  Musculoskeletal:  Positive for arthralgias.  Patient accompanied by her brother.

## 2023-09-25 ENCOUNTER — Other Ambulatory Visit: Payer: Self-pay | Admitting: Internal Medicine

## 2023-09-25 ENCOUNTER — Encounter: Payer: Self-pay | Admitting: Internal Medicine

## 2023-09-25 DIAGNOSIS — Z1231 Encounter for screening mammogram for malignant neoplasm of breast: Secondary | ICD-10-CM

## 2023-10-01 ENCOUNTER — Encounter: Payer: Self-pay | Admitting: Internal Medicine

## 2023-10-01 DIAGNOSIS — S7292XA Unspecified fracture of left femur, initial encounter for closed fracture: Secondary | ICD-10-CM | POA: Insufficient documentation

## 2023-10-15 ENCOUNTER — Ambulatory Visit
Admission: RE | Admit: 2023-10-15 | Discharge: 2023-10-15 | Disposition: A | Discharge status: Home / Self Care | Source: Ambulatory Visit | Attending: Internal Medicine | Admitting: Internal Medicine

## 2023-10-15 DIAGNOSIS — Z1231 Encounter for screening mammogram for malignant neoplasm of breast: Secondary | ICD-10-CM | POA: Diagnosis present

## 2024-05-31 ENCOUNTER — Ambulatory Visit

## 2024-06-13 ENCOUNTER — Other Ambulatory Visit

## 2024-06-13 ENCOUNTER — Ambulatory Visit: Admitting: Oncology
